# Patient Record
Sex: Female | Born: 1955 | Race: White | Hispanic: No | Marital: Single | Smoking: Never smoker
Health system: Southern US, Community
[De-identification: ages and names within clinical notes are randomized; demographics above are authoritative.]

## PROBLEM LIST (undated history)

## (undated) DIAGNOSIS — E1143 Type 2 diabetes mellitus with diabetic autonomic (poly)neuropathy: Secondary | ICD-10-CM

## (undated) DIAGNOSIS — G5690 Unspecified mononeuropathy of unspecified upper limb: Secondary | ICD-10-CM

## (undated) DIAGNOSIS — E785 Hyperlipidemia, unspecified: Secondary | ICD-10-CM

## (undated) DIAGNOSIS — E1165 Type 2 diabetes mellitus with hyperglycemia: Secondary | ICD-10-CM

## (undated) DIAGNOSIS — J189 Pneumonia, unspecified organism: Secondary | ICD-10-CM

## (undated) DIAGNOSIS — G629 Polyneuropathy, unspecified: Secondary | ICD-10-CM

## (undated) DIAGNOSIS — J45909 Unspecified asthma, uncomplicated: Secondary | ICD-10-CM

## (undated) DIAGNOSIS — K209 Esophagitis, unspecified without bleeding: Secondary | ICD-10-CM

## (undated) DIAGNOSIS — L97311 Non-pressure chronic ulcer of right ankle limited to breakdown of skin: Secondary | ICD-10-CM

## (undated) DIAGNOSIS — E119 Type 2 diabetes mellitus without complications: Secondary | ICD-10-CM

## (undated) DIAGNOSIS — K635 Polyp of colon: Secondary | ICD-10-CM

## (undated) DIAGNOSIS — K219 Gastro-esophageal reflux disease without esophagitis: Secondary | ICD-10-CM

## (undated) DIAGNOSIS — K5730 Diverticulosis of large intestine without perforation or abscess without bleeding: Secondary | ICD-10-CM

## (undated) DIAGNOSIS — K589 Irritable bowel syndrome without diarrhea: Secondary | ICD-10-CM

## (undated) DIAGNOSIS — K649 Unspecified hemorrhoids: Secondary | ICD-10-CM

## (undated) DIAGNOSIS — I639 Cerebral infarction, unspecified: Secondary | ICD-10-CM

## (undated) DIAGNOSIS — M1990 Unspecified osteoarthritis, unspecified site: Secondary | ICD-10-CM

## (undated) DIAGNOSIS — D649 Anemia, unspecified: Secondary | ICD-10-CM

## (undated) DIAGNOSIS — G5601 Carpal tunnel syndrome, right upper limb: Secondary | ICD-10-CM

## (undated) DIAGNOSIS — I10 Essential (primary) hypertension: Secondary | ICD-10-CM

## (undated) DIAGNOSIS — T466X5A Adverse effect of antihyperlipidemic and antiarteriosclerotic drugs, initial encounter: Secondary | ICD-10-CM

## (undated) DIAGNOSIS — Z789 Other specified health status: Secondary | ICD-10-CM

## (undated) DIAGNOSIS — G5793 Unspecified mononeuropathy of bilateral lower limbs: Secondary | ICD-10-CM

## (undated) DIAGNOSIS — E11628 Type 2 diabetes mellitus with other skin complications: Secondary | ICD-10-CM

## (undated) DIAGNOSIS — R112 Nausea with vomiting, unspecified: Secondary | ICD-10-CM

## (undated) DIAGNOSIS — Z9889 Other specified postprocedural states: Secondary | ICD-10-CM

## (undated) DIAGNOSIS — G4730 Sleep apnea, unspecified: Secondary | ICD-10-CM

## (undated) HISTORY — DX: Polyp of colon: K63.5

## (undated) HISTORY — DX: Essential (primary) hypertension: I10

## (undated) HISTORY — DX: Carpal tunnel syndrome, right upper limb: G56.01

## (undated) HISTORY — PX: APPENDECTOMY: SHX54

## (undated) HISTORY — PX: WISDOM TOOTH EXTRACTION: SHX21

## (undated) HISTORY — PX: CERVICAL FUSION: SHX112

## (undated) HISTORY — PX: LEG AMPUTATION BELOW KNEE: SHX694

## (undated) HISTORY — PX: NASAL SINUS SURGERY: SHX719

## (undated) HISTORY — PX: ABDOMINAL HYSTERECTOMY: SHX81

## (undated) HISTORY — PX: TONSILLECTOMY: SUR1361

---
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
## 2003-10-06 ENCOUNTER — Other Ambulatory Visit: Payer: Self-pay

## 2004-03-30 ENCOUNTER — Ambulatory Visit: Payer: Self-pay | Admitting: Unknown Physician Specialty

## 2004-09-13 ENCOUNTER — Ambulatory Visit: Payer: Self-pay | Admitting: Unknown Physician Specialty

## 2004-11-01 ENCOUNTER — Emergency Department: Payer: Self-pay | Admitting: Emergency Medicine

## 2004-12-17 ENCOUNTER — Ambulatory Visit: Payer: Self-pay | Admitting: Internal Medicine

## 2005-09-06 ENCOUNTER — Emergency Department: Payer: Self-pay | Admitting: Emergency Medicine

## 2006-01-29 ENCOUNTER — Ambulatory Visit: Payer: Self-pay

## 2006-11-19 ENCOUNTER — Emergency Department: Payer: Self-pay | Admitting: Emergency Medicine

## 2007-02-13 ENCOUNTER — Other Ambulatory Visit: Payer: Self-pay

## 2007-02-13 ENCOUNTER — Emergency Department: Payer: Self-pay | Admitting: Emergency Medicine

## 2007-02-14 ENCOUNTER — Other Ambulatory Visit: Payer: Self-pay

## 2007-02-14 ENCOUNTER — Observation Stay: Payer: Self-pay | Admitting: Internal Medicine

## 2007-06-06 DIAGNOSIS — I639 Cerebral infarction, unspecified: Secondary | ICD-10-CM

## 2007-06-06 DIAGNOSIS — R2990 Unspecified symptoms and signs involving the nervous system: Secondary | ICD-10-CM

## 2007-06-06 HISTORY — DX: Cerebral infarction, unspecified: I63.9

## 2007-06-06 HISTORY — DX: Unspecified symptoms and signs involving the nervous system: R29.90

## 2007-07-02 ENCOUNTER — Ambulatory Visit: Payer: Self-pay | Admitting: Physician Assistant

## 2007-07-10 ENCOUNTER — Ambulatory Visit: Payer: Self-pay | Admitting: Family Medicine

## 2007-11-07 ENCOUNTER — Ambulatory Visit: Payer: Self-pay | Admitting: Family Medicine

## 2007-11-13 ENCOUNTER — Ambulatory Visit: Payer: Self-pay | Admitting: Physician Assistant

## 2007-12-19 ENCOUNTER — Ambulatory Visit: Payer: Self-pay | Admitting: Physician Assistant

## 2008-11-26 ENCOUNTER — Inpatient Hospital Stay: Payer: Self-pay | Admitting: *Deleted

## 2008-12-30 ENCOUNTER — Ambulatory Visit: Payer: Self-pay | Admitting: Family Medicine

## 2010-03-04 ENCOUNTER — Ambulatory Visit: Payer: Self-pay | Admitting: Unknown Physician Specialty

## 2010-12-30 ENCOUNTER — Ambulatory Visit: Payer: Self-pay

## 2011-07-20 ENCOUNTER — Emergency Department (INDEPENDENT_AMBULATORY_CARE_PROVIDER_SITE_OTHER)
Admission: EM | Admit: 2011-07-20 | Discharge: 2011-07-20 | Disposition: A | Discharge status: Home / Self Care | Payer: 59 | Source: Home / Self Care | Attending: Emergency Medicine | Admitting: Emergency Medicine

## 2011-07-20 ENCOUNTER — Encounter (HOSPITAL_COMMUNITY): Payer: Self-pay | Admitting: Emergency Medicine

## 2011-07-20 DIAGNOSIS — M549 Dorsalgia, unspecified: Secondary | ICD-10-CM

## 2011-07-20 DIAGNOSIS — G8929 Other chronic pain: Secondary | ICD-10-CM

## 2011-07-20 DIAGNOSIS — M5430 Sciatica, unspecified side: Secondary | ICD-10-CM

## 2011-07-20 MED ORDER — HYDROCODONE-IBUPROFEN 7.5-200 MG PO TABS: 1.0000 | ORAL_TABLET | Freq: Three times a day (TID) | ORAL | Status: AC | PRN

## 2011-07-20 MED ORDER — CYCLOBENZAPRINE HCL 10 MG PO TABS: 10.0000 mg | ORAL_TABLET | Freq: Two times a day (BID) | ORAL | Status: AC | PRN

## 2011-07-20 NOTE — ED Notes (Signed)
Pt has a history of sciatic back injury 15 yrs ago. She states she hasn't had any serious exacerbations until now. She has tried heat, exercise, and meds and it still seems to get worse. She has tried a muscle relaxer and aspirin with no relief. She states the pain did not start from an injury and has been present for 1 month. Pain travels from right lower back to about mid thigh on right side.

## 2011-07-20 NOTE — Discharge Instructions (Signed)
No improvement within the next 5-7 days should followup with your primary care Dr. further treatment modalities and further evaluation should occur or pain last more than 6-8 weeks. We also discussed what symptoms or signs should require for you to go immediately to the emergency department for further evaluation. Take medicines as prescribed and be aware that drowsiness or dizziness and previous risk of falls as you will be taking a narcotic meds.      Back Pain, Adult Low back pain is very common. About 1 in 5 people have back pain.The cause of low back pain is rarely dangerous. The pain often gets better over time.About half of people with a sudden onset of back pain feel better in just 2 weeks. About 8 in 10 people feel better by 6 weeks.  CAUSES Some common causes of back pain include:  Strain of the muscles or ligaments supporting the spine.   Wear and tear (degeneration) of the spinal discs.   Arthritis.   Direct injury to the back.  DIAGNOSIS Most of the time, the direct cause of low back pain is not known.However, back pain can be treated effectively even when the exact cause of the pain is unknown.Answering your caregiver's questions about your overall health and symptoms is one of the most accurate ways to make sure the cause of your pain is not dangerous. If your caregiver needs more information, he or she may order lab work or imaging tests (X-rays or MRIs).However, even if imaging tests show changes in your back, this usually does not require surgery. HOME CARE INSTRUCTIONS For many people, back pain returns.Since low back pain is rarely dangerous, it is often a condition that people can learn to [facility] their own.   Remain active. It is stressful on the back to sit or stand in one place. Do not sit, drive, or stand in one place for more than 30 minutes at a time. Take short walks on level surfaces as soon as pain allows.Try to increase the length of time you walk each  day.   Do not stay in bed.Resting more than 1 or 2 days can delay your recovery.   Do not avoid exercise or work.Your body is made to move.It is not dangerous to be active, even though your back may hurt.Your back will likely heal faster if you return to being active before your pain is gone.   Pay attention to your body when you bend and lift. Many people have less discomfortwhen lifting if they bend their knees, keep the load close to their bodies,and avoid twisting. Often, the most comfortable positions are those that put less stress on your recovering back.   Find a comfortable position to sleep. Use a firm mattress and lie on your side with your knees slightly bent. If you lie on your back, put a pillow under your knees.   Only take over-the-counter or prescription medicines as directed by your caregiver. Over-the-counter medicines to reduce pain and inflammation are often the most helpful.Your caregiver may prescribe muscle relaxant drugs.These medicines help dull your pain so you can more quickly return to your normal activities and healthy exercise.   Put ice on the injured area.   Put ice in a plastic bag.   Place a towel between your skin and the bag.   Leave the ice on for 15 to 20 minutes, 3 to 4 times a day for the first 2 to 3 days. After that, ice and heat may be alternated  to reduce pain and spasms.   Ask your caregiver about trying back exercises and gentle massage. This may be of some benefit.   Avoid feeling anxious or stressed.Stress increases muscle tension and can worsen back pain.It is important to recognize when you are anxious or stressed and learn ways to manage it.Exercise is a great option.  SEEK MEDICAL CARE IF:  You have pain that is not relieved with rest or medicine.   You have pain that does not improve in 1 week.   You have new symptoms.   You are generally not feeling well.  SEEK IMMEDIATE MEDICAL CARE IF:   You have pain that radiates  from your back into your legs.   You develop new bowel or bladder control problems.   You have unusual weakness or numbness in your arms or legs.   You develop nausea or vomiting.   You develop abdominal pain.   You feel faint.  Document Released: 05/22/2005 Document Revised: 02/01/2011 Document Reviewed: 10/10/2010 [facility] Patient Information 2012 [place], [place].

## 2011-07-20 NOTE — ED Provider Notes (Signed)
History     CSN: [id number]  Arrival date & time 07/20/11  1628   First MD Initiated Contact with Patient 07/20/11 1644      Chief Complaint  Patient presents with  . Back Pain    (Consider location/radiation/quality/duration/timing/severity/associated sxs/prior treatment) HPI Comments: For about a month patient has been experiencing lower back pain shooting and radiating towards the lateral aspect of her right leg on occasions to her knee exacerbated by movements especially leaning forward or when she's standing up. Patient does not recall any specific injury fall or gesture that could have triggered this pain. She describes that it reminds her when she got her first sciatic injury about 15 years ago is not as severe but is very similar.  Patient denies any urinary incontinence any rectal incontinence no numbness or specific weakness to right lower extremity has been noticed.  Patient is a 56 y.o. female presenting with back pain. The history is provided by the patient.  Back Pain  This is a chronic problem. The current episode started more than 1 week ago. The problem occurs constantly. The problem has been gradually worsening. The pain is associated with lifting heavy objects. The pain is present in the lumbar spine. The quality of the pain is described as shooting. The pain radiates to the right thigh. The pain is at a severity of 8/10. The symptoms are aggravated by bending and twisting. Stiffness is present all day. Pertinent negatives include no fever, no numbness, no headaches, no abdominal swelling, no bladder incontinence, no dysuria, no paresthesias and no paresis. She has tried muscle relaxants and heat for the symptoms. The treatment provided mild relief.    History reviewed. No pertinent past medical history.  History reviewed. No pertinent past surgical history.  History reviewed. No pertinent family history.  History  Substance Use Topics  . Smoking status: Not on file   . Smokeless tobacco: Not on file  . Alcohol Use: Not on file    OB History    Grav Para Term Preterm Abortions TAB SAB Ect Mult Living                  Review of Systems  Constitutional: Negative for fever, chills, appetite change and fatigue.  Genitourinary: Negative for bladder incontinence and dysuria.  Musculoskeletal: Positive for back pain. Negative for joint swelling and arthralgias.  Neurological: Negative for speech difficulty, numbness, headaches and paresthesias.    Allergies  Review of patient's allergies indicates no known allergies.  Home Medications   Current Outpatient Rx  Name Route Sig Dispense Refill  . CYCLOBENZAPRINE HCL 10 MG PO TABS Oral Take 1 tablet (10 mg total) by mouth 2 (two) times daily as needed for muscle spasms. 20 tablet 0  . HYDROCODONE-IBUPROFEN 7.5-200 MG PO TABS Oral Take 1 tablet by mouth every 8 (eight) hours as needed for pain. 15 tablet 0    BP 145/91  Pulse 84  Temp(Src) 98.2 F (36.8 C) (Oral)  Resp 16  SpO2 99%  Physical Exam  Nursing note and vitals reviewed. Constitutional: She appears well-developed and well-nourished. No distress.  Musculoskeletal: She exhibits tenderness.       Lumbar back: She exhibits decreased range of motion, tenderness and pain. She exhibits no swelling, no edema and no deformity.       Back:    ED Course  Procedures (including critical care time)  Labs Reviewed - No data to display No results found.   1. Sciatic nerve pain  2. Chronic back pain       MDM  Patient reports chronic back pain with right sciatic pattern. With sporadic exacerbations able to control with muscle relaxers and heat therapy sometimes under guidance from her primary care doctor at [facility]). About a month ago started feeling this discomfort that has progressively gotten worse with very minimal improvement at home has not seen her primary care Dr. Patient has no muscle weakness and good reflexes. No  symptoms suggestive of a kaluretic canis syndrome or any neurological or vascular comp.romise. Have been recommended to followup with her primary care Dr. after 5 or 7 days for potential physical therapy referral for orthopedic referral if pain were to persist. Patient was also instructed to go emergently to the ED if specific symptoms were to appear        Jimmie Molly, MD 07/20/11 1729

## 2011-12-25 ENCOUNTER — Ambulatory Visit: Payer: Self-pay | Admitting: Family Medicine

## 2012-03-16 ENCOUNTER — Emergency Department: Payer: Self-pay | Admitting: Emergency Medicine

## 2012-10-02 ENCOUNTER — Ambulatory Visit: Payer: Self-pay | Admitting: Family Medicine

## 2012-11-27 ENCOUNTER — Observation Stay (HOSPITAL_COMMUNITY)
Admission: EM | Admit: 2012-11-27 | Discharge: 2012-11-30 | Disposition: A | Discharge status: Home / Self Care | Payer: 59 | Attending: Internal Medicine | Admitting: Internal Medicine

## 2012-11-27 ENCOUNTER — Encounter (HOSPITAL_COMMUNITY): Payer: Self-pay | Admitting: Emergency Medicine

## 2012-11-27 DIAGNOSIS — K5730 Diverticulosis of large intestine without perforation or abscess without bleeding: Secondary | ICD-10-CM

## 2012-11-27 DIAGNOSIS — Z8673 Personal history of transient ischemic attack (TIA), and cerebral infarction without residual deficits: Secondary | ICD-10-CM | POA: Insufficient documentation

## 2012-11-27 DIAGNOSIS — K559 Vascular disorder of intestine, unspecified: Secondary | ICD-10-CM

## 2012-11-27 DIAGNOSIS — K209 Esophagitis, unspecified without bleeding: Secondary | ICD-10-CM | POA: Insufficient documentation

## 2012-11-27 DIAGNOSIS — E785 Hyperlipidemia, unspecified: Secondary | ICD-10-CM | POA: Insufficient documentation

## 2012-11-27 DIAGNOSIS — D62 Acute posthemorrhagic anemia: Secondary | ICD-10-CM | POA: Insufficient documentation

## 2012-11-27 DIAGNOSIS — E119 Type 2 diabetes mellitus without complications: Secondary | ICD-10-CM

## 2012-11-27 DIAGNOSIS — E440 Moderate protein-calorie malnutrition: Secondary | ICD-10-CM

## 2012-11-27 DIAGNOSIS — K625 Hemorrhage of anus and rectum: Principal | ICD-10-CM

## 2012-11-27 DIAGNOSIS — D126 Benign neoplasm of colon, unspecified: Secondary | ICD-10-CM

## 2012-11-27 DIAGNOSIS — I10 Essential (primary) hypertension: Secondary | ICD-10-CM

## 2012-11-27 DIAGNOSIS — Z79899 Other long term (current) drug therapy: Secondary | ICD-10-CM | POA: Insufficient documentation

## 2012-11-27 DIAGNOSIS — R109 Unspecified abdominal pain: Secondary | ICD-10-CM

## 2012-11-27 DIAGNOSIS — K648 Other hemorrhoids: Secondary | ICD-10-CM | POA: Insufficient documentation

## 2012-11-27 HISTORY — DX: Polyneuropathy, unspecified: G62.9

## 2012-11-27 HISTORY — DX: Unspecified hemorrhoids: K64.9

## 2012-11-27 HISTORY — DX: Hyperlipidemia, unspecified: E78.5

## 2012-11-27 HISTORY — DX: Esophagitis, unspecified without bleeding: K20.90

## 2012-11-27 HISTORY — DX: Type 2 diabetes mellitus without complications: E11.9

## 2012-11-27 HISTORY — DX: Diverticulosis of large intestine without perforation or abscess without bleeding: K57.30

## 2012-11-27 HISTORY — DX: Cerebral infarction, unspecified: I63.9

## 2012-11-27 HISTORY — DX: Esophagitis, unspecified: K20.9

## 2012-11-27 MED ORDER — FENTANYL CITRATE 0.05 MG/ML IJ SOLN
50.0000 ug | INTRAMUSCULAR | Status: DC | PRN
  Administered 2012-11-28 (×2): 50 ug via INTRAVENOUS
  Filled 2012-11-27 (×2): qty 2

## 2012-11-27 MED ORDER — ONDANSETRON HCL 4 MG/2ML IJ SOLN
4.0000 mg | Freq: Once | INTRAMUSCULAR | Status: AC
  Administered 2012-11-28: 4 mg via INTRAVENOUS
  Filled 2012-11-27: qty 2

## 2012-11-27 MED ORDER — SODIUM CHLORIDE 0.9 % IV SOLN
INTRAVENOUS | Status: DC
  Administered 2012-11-28 – 2012-11-30 (×6): via INTRAVENOUS

## 2012-11-27 NOTE — ED Provider Notes (Signed)
History    CSN: [id number] Arrival date & time 11/27/12  2127  First MD Initiated Contact with Patient 11/27/12 2313     Chief Complaint  Patient presents with  . Rectal Bleeding   (Consider location/radiation/quality/duration/timing/severity/associated sxs/prior Treatment) HPI Hx per PT BRB rectal bleeding x 2 days worse tonight, describes sig amount of blood in the toilet with and without BM, has h/o internal hemorrhoids, no h/o rectal bleeding. Some low back pain denies any rectal pain or ABD pain, no hematuria and no vag bleeding/ prior hysto.  No F/C, no diarrhea or constipation, no black stools/ some N/V today. Takes 325mg  ASA daily. Has h/o diverticulitis  Past Medical History  Diagnosis Date  . Hemorrhoid   . Neuropathy   . Diabetes mellitus without complication   . Hyperlipemia   . Stroke 2009    mimi stroke   Past Surgical History  Procedure Laterality Date  . Tonsillectomy    . Abdominal hysterectomy     No family history on file. History  Substance Use Topics  . Smoking status: Never Smoker   . Smokeless tobacco: Never Used  . Alcohol Use: No   OB History   Grav Para Term Preterm Abortions TAB SAB Ect Mult Living                 Review of Systems  Constitutional: Negative for fever and chills.  HENT: Negative for neck pain and neck stiffness.   Eyes: Negative for pain.  Respiratory: Negative for shortness of breath.   Cardiovascular: Negative for chest pain.  Gastrointestinal: Positive for blood in stool and anal bleeding. Negative for abdominal pain.  Genitourinary: Negative for dysuria.  Musculoskeletal: Negative for back pain.  Skin: Negative for rash.  Neurological: Negative for headaches.  All other systems reviewed and are negative.    Allergies  Atorvastatin; Latex; and Pravastatin  Home Medications   Current Outpatient Rx  Name  Route  Sig  Dispense  Refill  . acetaminophen (TYLENOL) 650 MG CR tablet   Oral   Take 1,300 mg by  mouth every 8 (eight) hours as needed for pain.         [name] albuterol (PROVENTIL HFA;VENTOLIN HFA) 108 (90 BASE) MCG/ACT inhaler   Inhalation   Inhale 2 puffs into the lungs every 6 (six) hours as needed for wheezing or shortness of breath.         [name] aspirin 325 MG tablet   Oral   Take 325 mg by mouth at bedtime.         . gabapentin (NEURONTIN) 300 MG capsule   Oral   Take 300 mg by mouth 3 (three) times daily.         [name] lisinopril (PRINIVIL,ZESTRIL) 10 MG tablet   Oral   Take 10 mg by mouth daily.         . metFORMIN (GLUCOPHAGE) 500 MG tablet   Oral   Take 500 mg by mouth 2 (two) times daily with a meal.         . Multiple Vitamin (MULTIVITAMIN WITH MINERALS) TABS   Oral   Take 1 tablet by mouth daily.         . niacin (NIASPAN) 500 MG CR tablet   Oral   Take 500 mg by mouth at bedtime.         [name] omega-3 acid ethyl esters (LOVAZA) 1 G capsule   Oral   Take 2 g by mouth 2 (two) times  daily.          BP 142/55  Pulse 84  Temp(Src) 98.6 F (37 C) (Oral)  Resp 15  Ht 5' (1.524 m)  Wt 206 lb (93.441 kg)  BMI 40.23 kg/m2  SpO2 97% Physical Exam  Constitutional: She is oriented to person, place, and time. She appears well-developed and well-nourished.  HENT:  Head: Normocephalic and atraumatic.  Eyes: EOM are normal. Pupils are equal, round, and reactive to light.  Neck: Neck supple.  Cardiovascular: Normal rate, regular rhythm and intact distal pulses.   Pulmonary/Chest: Effort normal and breath sounds normal. No respiratory distress.  Abdominal: Soft. Bowel sounds are normal. She exhibits no distension. There is no tenderness.  Genitourinary:  Rectal exam, no fissure or ext hemorrhoids, nontender, no blood or stool in rectal vault  Musculoskeletal: Normal range of motion. She exhibits no edema.  Neurological: She is alert and oriented to person, place, and time.  Skin: Skin is warm and dry.    ED Course  Procedures (including critical care  time)   Results for orders placed during the hospital encounter of 11/27/12  CBC      Result Value Range   WBC 9.7  4.0 - 10.5 K/uL   RBC 4.88  3.87 - 5.11 MIL/uL   Hemoglobin 13.2  12.0 - 15.0 g/dL   HCT 95.6  21.3 - 08.6 %   MCV 82.0  78.0 - 100.0 fL   MCH 27.0  26.0 - 34.0 pg   MCHC 33.0  30.0 - 36.0 g/dL   RDW 57.8  46.9 - 62.9 %   Platelets 262  150 - 400 K/uL  COMPREHENSIVE METABOLIC PANEL      Result Value Range   Sodium 136  135 - 145 mEq/L   Potassium 3.9  3.5 - 5.1 mEq/L   Chloride 99  96 - 112 mEq/L   CO2 25  19 - 32 mEq/L   Glucose, Bld 174 (*) 70 - 99 mg/dL   BUN 13  6 - 23 mg/dL   Creatinine, Ser 5.28  0.50 - 1.10 mg/dL   Calcium 9.7  8.4 - 41.3 mg/dL   Total Protein 7.7  6.0 - 8.3 g/dL   Albumin 3.6  3.5 - 5.2 g/dL   AST 18  0 - 37 U/L   ALT 21  0 - 35 U/L   Alkaline Phosphatase 92  39 - 117 U/L   Total Bilirubin 0.1 (*) 0.3 - 1.2 mg/dL   GFR calc non Af Amer >90  >90 mL/min   GFR calc Af Amer >90  >90 mL/min   Guaiac positive  IVFs, fentanyl and zofran  3:40 AM MED consulted, d/w Dr Conley Rolls - he will admit for GIB  MDM  Rectal bleeding  Labs obtained/ reviewed  IVFs and IV medications provided  MED admit  Sunnie Nielsen, MD 11/28/12 [phone]

## 2012-11-27 NOTE — ED Notes (Signed)
Pt states that she began blood in her stool on Monday, which progressed to bright red frank blood yesterday and today. Pt states she has hx of hemerroids but does not feel any rectal pain. Pt also states that she has had lower back pain and left sided abdominal pain.

## 2012-11-28 ENCOUNTER — Encounter (HOSPITAL_COMMUNITY): Payer: Self-pay | Admitting: Neurology

## 2012-11-28 DIAGNOSIS — R109 Unspecified abdominal pain: Secondary | ICD-10-CM

## 2012-11-28 DIAGNOSIS — K625 Hemorrhage of anus and rectum: Secondary | ICD-10-CM

## 2012-11-28 DIAGNOSIS — I10 Essential (primary) hypertension: Secondary | ICD-10-CM | POA: Diagnosis present

## 2012-11-28 DIAGNOSIS — E440 Moderate protein-calorie malnutrition: Secondary | ICD-10-CM | POA: Insufficient documentation

## 2012-11-28 DIAGNOSIS — D126 Benign neoplasm of colon, unspecified: Secondary | ICD-10-CM

## 2012-11-28 LAB — COMPREHENSIVE METABOLIC PANEL WITH GFR
ALT: 21 U/L (ref 0–35)
AST: 18 U/L (ref 0–37)
Albumin: 3.6 g/dL (ref 3.5–5.2)
Alkaline Phosphatase: 92 U/L (ref 39–117)
BUN: 13 mg/dL (ref 6–23)
CO2: 25 meq/L (ref 19–32)
Calcium: 9.7 mg/dL (ref 8.4–10.5)
Chloride: 99 meq/L (ref 96–112)
Creatinine, Ser: 0.56 mg/dL (ref 0.50–1.10)
GFR calc Af Amer: >90 mL/min
GFR calc non Af Amer: >90 mL/min
Glucose, Bld: 174 mg/dL — ABNORMAL HIGH (ref 70–99)
Potassium: 3.9 meq/L (ref 3.5–5.1)
Sodium: 136 meq/L (ref 135–145)
Total Bilirubin: 0.1 mg/dL — ABNORMAL LOW (ref 0.3–1.2)
Total Protein: 7.7 g/dL (ref 6.0–8.3)

## 2012-11-28 LAB — CBC
HCT: 39.5 % (ref 36.0–46.0)
Hemoglobin: 12.3 g/dL (ref 12.0–15.0)
Hemoglobin: 11.4 g/dL — ABNORMAL LOW (ref 12.0–15.0)
MCH: 27.3 pg (ref 26.0–34.0)
MCHC: 33 g/dL (ref 30.0–36.0)
Platelets: 194 10*3/uL (ref 150–400)
Platelets: 262 10*3/uL (ref 150–400)
RBC: 4.51 MIL/uL (ref 3.87–5.11)
RBC: 4.3 MIL/uL (ref 3.87–5.11)
RDW: 13 % (ref 11.5–15.5)
RDW: 13.1 % (ref 11.5–15.5)
WBC: 8.1 10*3/uL (ref 4.0–10.5)
WBC: 9.7 10*3/uL (ref 4.0–10.5)

## 2012-11-28 LAB — OCCULT BLOOD X 1 CARD TO LAB, STOOL: Fecal Occult Bld: POSITIVE — AB

## 2012-11-28 LAB — GLUCOSE, CAPILLARY
Glucose-Capillary: 153 mg/dL — ABNORMAL HIGH (ref 70–99)
Glucose-Capillary: 153 mg/dL — ABNORMAL HIGH (ref 70–99)
Glucose-Capillary: 132 mg/dL — ABNORMAL HIGH (ref 70–99)

## 2012-11-28 MED ORDER — ALBUTEROL SULFATE HFA 108 (90 BASE) MCG/ACT IN AERS
2.0000 | INHALATION_SPRAY | Freq: Four times a day (QID) | RESPIRATORY_TRACT | Status: DC | PRN
Start: 2012-11-28 — End: 2012-11-30
  Filled 2012-11-28: qty 6.7

## 2012-11-28 MED ORDER — ONDANSETRON HCL 4 MG/2ML IJ SOLN: 4.0000 mg | Freq: Four times a day (QID) | INTRAMUSCULAR | Status: DC | PRN

## 2012-11-28 MED ORDER — LISINOPRIL 10 MG PO TABS
10.0000 mg | ORAL_TABLET | Freq: Every day | ORAL | Status: DC
  Administered 2012-11-28 – 2012-11-30 (×3): 10 mg via ORAL
  Filled 2012-11-28 (×3): qty 1

## 2012-11-28 MED ORDER — ACETAMINOPHEN 325 MG PO TABS: 650.0000 mg | ORAL_TABLET | Freq: Four times a day (QID) | ORAL | Status: DC | PRN

## 2012-11-28 MED ORDER — INSULIN ASPART 100 UNIT/ML ~~LOC~~ SOLN: 0.0000 [IU] | SUBCUTANEOUS | Status: DC | PRN

## 2012-11-28 MED ORDER — MORPHINE SULFATE 2 MG/ML IJ SOLN: 1.0000 mg | INTRAMUSCULAR | Status: DC | PRN

## 2012-11-28 MED ORDER — ADULT MULTIVITAMIN W/MINERALS CH
1.0000 | ORAL_TABLET | Freq: Every day | ORAL | Status: DC
  Administered 2012-11-28 – 2012-11-30 (×3): 1 via ORAL
  Filled 2012-11-28 (×3): qty 1

## 2012-11-28 MED ORDER — GABAPENTIN 300 MG PO CAPS
300.0000 mg | ORAL_CAPSULE | Freq: Three times a day (TID) | ORAL | Status: DC
  Administered 2012-11-28 – 2012-11-30 (×7): 300 mg via ORAL
  Filled 2012-11-28 (×9): qty 1

## 2012-11-28 MED ORDER — HYDROCODONE-ACETAMINOPHEN 5-325 MG PO TABS
1.0000 | ORAL_TABLET | ORAL | Status: DC | PRN
  Administered 2012-11-28 – 2012-11-29 (×3): 1 via ORAL
  Filled 2012-11-28 (×3): qty 1

## 2012-11-28 MED ORDER — PANTOPRAZOLE SODIUM 40 MG PO TBEC
40.0000 mg | DELAYED_RELEASE_TABLET | Freq: Every day | ORAL | Status: DC
  Administered 2012-11-28 – 2012-11-30 (×3): 40 mg via ORAL
  Filled 2012-11-28 (×3): qty 1

## 2012-11-28 NOTE — Care Management Note (Addendum)
    Page 1 of 1   11/29/2012     1:13:32 PM   CARE MANAGEMENT NOTE 11/29/2012  Patient:  Olivia, Romero   Account Number:  [id number]  Date Initiated:  11/28/2012  Documentation initiated by:  Lanier Clam  Subjective/Objective Assessment:   ADMITTED W/[id number].[id number].     Action/Plan:   FROM HOME ALONE.HAS PCP,PHARMACY.   Anticipated DC Date:  11/29/2012   Anticipated DC Plan:  HOME/SELF CARE      DC Planning Services  CM consult      Choice offered to / List presented to:             Status of service:  In process, will continue to follow Medicare Important Message given?   (If response is "NO", the following Medicare IM given date fields will be blank) Date Medicare IM given:   Date Additional Medicare IM given:    Discharge Disposition:    Per UR Regulation:  Reviewed for med. necessity/level of care/duration of stay  If discussed at Long Length of Stay Meetings, dates discussed:    Comments:  11/29/12 Olivia Barsamian RN,BSN NCM [phone] SPOKE TO PATIENT ABOUT HOSPITAL STATUS OF OBSERVATION &PER GI NOTE COLONOSCOPY TO BE  DONE IN THE HOSPITAL.EXPLAINED THAT SHE COULD CALL HER INSURANCE CO WITH CONCERNS ABOUT COVERED SERVICES.PATIENT VOICED UNDERSTANDING.MD/NSG UPDATED.  11/28/12 Olivia Bevilacqua RN,BSN NCM [phone]

## 2012-11-28 NOTE — Progress Notes (Signed)
[facility] PROGRESS NOTE  Olivia Romero RUE:[id number] DOB: 1956/04/29 DOA: 11/27/2012 PCP: Dennison Mascot, MD  Brief narrative 57 yo WF with a PMHx of DM II, hyperlipidemia, TIA, diverticulosis, internal hemorrhoids, and esophagitis who presents to the [facility] ED with c/o 5 episodes of bright red rectal bleeding since Monday 6/23. She gives history of left-sided lower back pain that started prior to rectal bleeding and subsequent left lower quadrant abdominal pain. She denies nausea, vomiting, dizziness, lightheadedness, fever or chills. Initial BMs had some stool but subsequent ones was only blood. No clots. Last BM on 6/25 at 4 PM. Hx of documented internal hemorrhoids and diverticulosis per colonoscopy about 7 years ago per Dr. Maryruth Bun (deceased) in [place], [place]. Reports "several" episodes of diverticulitis over the years.  She takes ASA 325mg  per day since a TIA in 2009. No Plavix. Has continued her ASA since Monday. In the ED, her Hgb is normal. Rectal exam revealed positive guiac. Hospitalist admission requested.   Assessment/Plan: 1. Rectal bleeding: DD-hemorrhoidal/diverticular/ischemic colitis/cancer. Apparently had a small soft BM with small amount of bright red blood late this morning. Monitor bleeding. Hold ASA. IVF resuscitation. Should abd pain worsen or she develops fever, n/v-may need CT abd. She doesn't have a fever and her WBCC is normal, so doubt diverticulitis. Hemoglobin stable. Started clear liquid diet. [facility] consulted- Monitor for spontaneous cessation and may not require inpatient colonoscopy. 2. DM II-will hold Metformin while here and use SSI. Good inpatient control. 3. HTN-controlled, cont Lisinopril.  4. Hyperlipidemia-cont med  5. Hx TIA in 2009. Hold ASA.  6. VTE ppx-SCDs.  7. Hx esophagitis plus #1-Protonix.   Code Status: Full Family Communication: None Disposition Plan: Home when medically stable   Consultants:  [facility]  Procedures:  None  Antibiotics:  None   HPI/Subjective: Feels better. Did not have a BM since 6/25 4 PM until late this morning. Mild low back pain and left lower quadrant abdominal pain. No nausea, vomiting, fever or chills.  Objective: Filed Vitals:   11/27/12 2147 11/28/12 0630  BP: 142/55 111/48  Pulse: 84 69  Temp: 98.6 F (37 C) 98.1 F (36.7 C)  TempSrc: Oral Oral  Resp: 15 16  Height: 5' (1.524 m) 5' (1.524 m)  Weight: 93.441 kg (206 lb) 93.4 kg (205 lb 14.6 oz)  SpO2: 97% 99%    Intake/Output Summary (Last 24 hours) at 11/28/12 1308 Last data filed at 11/28/12 0955  Gross per 24 hour  Intake    320 ml  Output    400 ml  Net    -80 ml   Filed Weights   11/27/12 2147 11/28/12 0630  Weight: 93.441 kg (206 lb) 93.4 kg (205 lb 14.6 oz)    Exam:   General exam: Comfortable.  Respiratory system: Clear. No increased work of breathing.  Cardiovascular system: S1 & S2 heard, RRR. No JVD, murmurs, gallops, clicks or pedal edema.  Gastrointestinal system: Abdomen is nondistended. Tender left lower quadrant but without rigidity, guarding or rebound. Abdomen essentially soft. Normal bowel sounds heard.  Central nervous system: Alert and oriented. No focal neurological deficits.  Extremities: Symmetric 5 x 5 power.   Data Reviewed: Basic Metabolic Panel:  Recent Labs Lab 11/27/12 2351  NA 136  K 3.9  CL 99  CO2 25  GLUCOSE 174*  BUN 13  CREATININE 0.56  CALCIUM 9.7   Liver Function Tests:  Recent Labs Lab 11/27/12 2351  AST 18  ALT 21  ALKPHOS 92  BILITOT  0.1*  PROT 7.7  ALBUMIN 3.6   No results found for this basename: LIPASE, AMYLASE,  in the last 168 hours No results found for this basename: AMMONIA,  in the last 168 hours CBC:  Recent Labs Lab 11/27/12 2351 11/28/12 0730  WBC 9.7 7.8  HGB 13.2 12.3  HCT 40.0 37.4  MCV 82.0 82.9  PLT 262 228   Cardiac Enzymes: No results found for this basename: CKTOTAL, CKMB,  CKMBINDEX, TROPONINI,  in the last 168 hours BNP (last 3 results) No results found for this basename: PROBNP,  in the last 8760 hours CBG:  Recent Labs Lab 11/28/12 0831 11/28/12 1127  GLUCAP 153* 153*    No results found for this or any previous visit (from the past 240 hour(s)).   Studies: No results found.   Additional labs:   Scheduled Meds: . gabapentin  300 mg Oral TID  . lisinopril  10 mg Oral Daily  . multivitamin with minerals  1 tablet Oral Daily  . pantoprazole  40 mg Oral Daily   Continuous Infusions: . sodium chloride 125 mL/hr at 11/28/12 1610    Principal Problem:   Bright red blood per rectum Active Problems:   Diabetes mellitus   Essential hypertension, benign    Time spent: 25 minutes    [facility]  [facility] Pager [phone].   If 8PM-8AM, please contact night-coverage at [email or web address], password [email or web address] 11/28/2012, 1:08 PM  LOS: 1 day

## 2012-11-28 NOTE — H&P (Signed)
Olivia Romero is an 57 y.o. female.    PCP: Dr. Ralene Muskrat of [place]  Chief Complaint: BRBPR HPI: Olivia Romero is a 58 yo WF with a PMHx of DM II, hyperlipidemia, TIA, diverticulosis, internal hemorrhoids, and esophagitis who presents to the [facility] ED with c/o rectal bleeding since Monday 6/23 associated mostly with BMs. She reports having some lower back discomfort then having BRBPR with BM Monday night. Since then, she has had 1-2 BMs per day with BRBPR. No bleeding between BMs. Had one episode of bleeding on toilet without having a BM, but did have "gas". Has NEVER had a GIB before. She reports no diarrhea or constipation. Mild LLQ discomfort which just started late yesterday. Some nausea today but no vomiting. Not much of an appetite since Monday.  Hx of documented internal hemorrhoids and diverticulosis per colonoscopy about 7 years ago per Dr. Maryruth Bun (deceased) in [place], [place]. Reports "several" episodes of diverticulitis over the years.  She takes ASA 325mg  per day since a TIA in 2009. No Plavix. Has continued her ASA since Monday.  Hx DM II. Takes Meformin at home. Sugars fairly normal.  In the ED, her Hgb is normal. Rectal exam revealed positive guiac. Therefore, because of her BRBPR and hx of internal hemorrhoids and diverticulosis, [facility] is asked to admit Olivia Romero for further evaluation and treatment.   Past Medical History  Diagnosis Date  . Hemorrhoid     internal  . Neuropathy   . Diabetes mellitus without complication   . Hyperlipemia   . Stroke-like episode 2009    TIA  . Diverticulosis of colon   . Esophagitis     Past Surgical History  Procedure Laterality Date  . Tonsillectomy    . Abdominal hysterectomy      FMHx: diabetes, hyperlipidemia, hypertension Social History:  reports that she has never smoked. She has never used smokeless tobacco. She reports that she does not drink alcohol or use illicit drugs. Works as a Runner, broadcasting/film/video at [employer]. One  grown son, one grandchild. Divorced.   Allergies:  Allergies  Allergen Reactions  . Atorvastatin     Unknown bothers liver  . Latex Itching  . Pravastatin Itching and Rash     Results for orders placed during the hospital encounter of 11/27/12 (from the past 48 hour(s))  CBC     Status: None   Collection Time    11/27/12 11:51 PM      Result Value Range   WBC 9.7  4.0 - 10.5 K/uL   RBC 4.88  3.87 - 5.11 MIL/uL   Hemoglobin 13.2  12.0 - 15.0 g/dL   HCT 16.1  09.6 - 04.5 %   MCV 82.0  78.0 - 100.0 fL   MCH 27.0  26.0 - 34.0 pg   MCHC 33.0  30.0 - 36.0 g/dL   RDW 40.9  81.1 - 91.4 %   Platelets 262  150 - 400 K/uL  COMPREHENSIVE METABOLIC PANEL     Status: Abnormal   Collection Time    11/27/12 11:51 PM      Result Value Range   Sodium 136  135 - 145 mEq/L   Potassium 3.9  3.5 - 5.1 mEq/L   Chloride 99  96 - 112 mEq/L   CO2 25  19 - 32 mEq/L   Glucose, Bld 174 (*) 70 - 99 mg/dL   BUN 13  6 - 23 mg/dL   Creatinine, Ser 7.82  0.50 - 1.10 mg/dL  Calcium 9.7  8.4 - 10.5 mg/dL   Total Protein 7.7  6.0 - 8.3 g/dL   Albumin 3.6  3.5 - 5.2 g/dL   AST 18  0 - 37 U/L   ALT 21  0 - 35 U/L   Alkaline Phosphatase 92  39 - 117 U/L   Total Bilirubin 0.1 (*) 0.3 - 1.2 mg/dL   GFR calc non Af Amer >90  >90 mL/min   GFR calc Af Amer >90  >90 mL/min   Comment:            The eGFR has been calculated     using the CKD EPI equation.     This calculation has not been     validated in all clinical     situations.     eGFR's persistently     <90 mL/min signify     possible Chronic Kidney Disease.   No results found.  Review of Systems  Constitutional: Negative for fever, chills, weight loss, malaise/fatigue and diaphoresis.  HENT: Negative.   Eyes: Negative.  Negative for blurred vision.  Respiratory: Negative for cough, hemoptysis, sputum production and shortness of breath.   Cardiovascular: Negative for chest pain, orthopnea and leg swelling.  Gastrointestinal: Positive for  nausea, abdominal pain (left lower quadrant) and blood in stool (since Monday at 8pm, has had 1-2 stools/day with BRBPR since then). Negative for heartburn, diarrhea and constipation.  Genitourinary: Negative for dysuria, urgency, frequency, hematuria and flank pain.  Musculoskeletal: Positive for back pain (pressure, more on the left, only since BRBPR).  Skin: Negative.  Negative for rash.  Neurological: Negative for dizziness, speech change, focal weakness and weakness.  Endo/Heme/Allergies: Does not bruise/bleed easily.  Psychiatric/Behavioral: Negative for depression. The patient is not nervous/anxious.     Blood pressure 142/55, pulse 84, temperature 98.6 F (37 C), temperature source Oral, resp. rate 15, height 5' (1.524 m), weight 93.441 kg (206 lb), SpO2 97.00%. Physical Exam  Constitutional: She is oriented to person, place, and time. She appears well-developed and well-nourished. No distress.  Obese. Appears well.   HENT:  Head: Normocephalic and atraumatic.  Right Ear: External ear normal.  Left Ear: External ear normal.  Mouth/Throat: Oropharynx is clear and moist. No oropharyngeal exudate.  Eyes: Conjunctivae are normal. Pupils are equal, round, and reactive to light. Right eye exhibits no discharge. Left eye exhibits no discharge. No scleral icterus.  Neck: Normal range of motion. Neck supple. No tracheal deviation present. No thyromegaly present.  Cardiovascular: Normal rate, regular rhythm and intact distal pulses.  Exam reveals no gallop and no friction rub.   No murmur heard. No carotid bruits  Respiratory: Effort normal and breath sounds normal. No respiratory distress. She has no wheezes. She has no rales. She exhibits no tenderness.  GI: Soft. Bowel sounds are normal. She exhibits no distension. There is tenderness (LLQ). There is no rebound and no guarding.  Genitourinary:  Deferred   Musculoskeletal: Normal range of motion. She exhibits no edema and no tenderness.   Lymphadenopathy:    She has no cervical adenopathy.  Neurological: She is alert and oriented to person, place, and time. No cranial nerve deficit.  Skin: Skin is warm and dry. No rash noted. She is not diaphoretic. No erythema. No pallor.  Psychiatric: She has a normal mood and affect. Her behavior is normal. Judgment and thought content normal.     Assessment/Plan 1. BRBPR with hx of internal hemorrhoids and diverticulosis-admit to med surg. Serial CBCs.  Monitor bleeding. Hold ASA. GI consult in am deferred to attending. Had colonoscopy about 7 years ago (routine). IVF resuscitation. Should abd pain worsen or she develops fever, n/v-may need CT abd. She doesn't have a fever and her WBCC is normal, so doubt diverticulitis.  2. DM II-will hold Metformin while here and use SSI.  3. HTN-controlled, cont Lisinopril. 4. Hyperlipidemia-cont med 5. Hx TIA in 2009. Hold ASA.  6. VTE ppx-SCDs. 7. Hx esophagitis plus #1-Protonix.  Rest of plan per orders.  Discussed with Dr. Conley Rolls who will see Olivia Romero also and review/cosign note.   KIRBY-GRAHAM, Mechel Haggard 11/28/2012, 4:00 AM

## 2012-11-28 NOTE — H&P (Signed)
Agreed and as discussed with MLP.  I have seen this patient and reviewed her chart.  57 yo presents with several BRBPR.  Essentially no abdominal pain, maybe slightly LLQ.  Hemodynamic stability and normal Hb.  She has known diverticulosis.  No elevated BUN, melena, or NSAIDS use.  Suspect diverticular bleed.  Will admit for serial H/H.

## 2012-11-28 NOTE — Consult Note (Signed)
[facility] Consultation  Referring Provider:     [facility]. Primary Care Physician:  Dennison Mascot, MD Primary Gastroenterologist:    None at present.  Reason for Consultation:    Rectal bleeding          HPI:   Olivia Romero is a 57 y.o. female who presented to ED last night with rectal bleeding. She is hemodynamically stable, hbg down from 13.2 last night to 12.3 this am. On Monday night she developed lower back pain. Following that patient went to bathroom and passed a formed stool and some bright red blood. She had a few more episodes Tuesday and yesterday. Stools are not particularly loose, not any more frequent than normal. Patient had a colonoscopy in [place] approximately 9 years ago with a GI physician who has since passed away. She called Dr. Earnest Conroy office yesterday to make a new patient appointment but couldn't be seen until July. Patient elected to come to ED for evaluation.   Patient gives a history of diverticulitis treated with antibiotics on several occasions. No other chronic GI complaints. She had IBS in teenage years but no problems now. No FMH of IBD or colon cancer.    Past Medical History  Diagnosis Date  . Hemorrhoid     internal  . Neuropathy   . Diabetes mellitus without complication   . Hyperlipemia   . Stroke-like episode 2009    TIA  . Diverticulosis of colon   . Esophagitis     Past Surgical History  Procedure Laterality Date  . Tonsillectomy    . Abdominal hysterectomy     FMH: No CRC or IBD.  History  Substance Use Topics  . Smoking status: Never Smoker   . Smokeless tobacco: Never Used  . Alcohol Use: No    Prior to Admission medications   Medication Sig Start Date End Date Taking? Authorizing Provider  albuterol (PROVENTIL HFA;VENTOLIN HFA) 108 (90 BASE) MCG/ACT inhaler Inhale 2 puffs into the lungs every 6 (six) hours as needed for wheezing or shortness of breath.   Yes Historical Provider, MD  aspirin 325 MG  tablet Take 325 mg by mouth at bedtime.   Yes Historical Provider, MD  gabapentin (NEURONTIN) 300 MG capsule Take 300 mg by mouth 3 (three) times daily.   Yes Historical Provider, MD  lisinopril (PRINIVIL,ZESTRIL) 10 MG tablet Take 10 mg by mouth daily.   Yes Historical Provider, MD  metFORMIN (GLUCOPHAGE) 500 MG tablet Take 500 mg by mouth 2 (two) times daily with a meal.   Yes Historical Provider, MD  Multiple Vitamin (MULTIVITAMIN WITH MINERALS) TABS Take 1 tablet by mouth daily.   Yes Historical Provider, MD  niacin (NIASPAN) 500 MG CR tablet Take 500 mg by mouth at bedtime.   Yes Historical Provider, MD  omega-3 acid ethyl esters (LOVAZA) 1 G capsule Take 2 g by mouth 2 (two) times daily.   Yes Historical Provider, MD    Current Facility-Administered Medications  Medication Dose Route Frequency Provider Last Rate Last Dose  . 0.9 %  sodium chloride infusion   Intravenous Continuous Sunnie Nielsen, MD 125 mL/hr at 11/28/12 [phone]    . acetaminophen (TYLENOL) tablet 650 mg  650 mg Oral Q6H PRN Leda Gauze, NP      . albuterol (PROVENTIL HFA;VENTOLIN HFA) 108 (90 BASE) MCG/ACT inhaler 2 puff  2 puff Inhalation Q6H PRN Leda Gauze, NP      . gabapentin (NEURONTIN) capsule 300 mg  300 mg Oral TID  Leda Gauze, NP   300 mg at 11/28/12 1124  . HYDROcodone-acetaminophen (NORCO/VICODIN) 5-325 MG per tablet 1 tablet  1 tablet Oral Q4H PRN Leda Gauze, NP      . insulin aspart (novoLOG) injection 0-15 Units  0-15 Units Subcutaneous Q4H PRN Leda Gauze, NP      . lisinopril (PRINIVIL,ZESTRIL) tablet 10 mg  10 mg Oral Daily Leda Gauze, NP   10 mg at 11/28/12 1124  . morphine 2 MG/ML injection 1 mg  1 mg Intravenous Q4H PRN Leda Gauze, NP      . multivitamin with minerals tablet 1 tablet  1 tablet Oral Daily Leda Gauze, NP   1 tablet at 11/28/12 1124  . ondansetron (ZOFRAN) injection 4 mg  4 mg Intravenous Q6H PRN Leda Gauze,  NP      . pantoprazole (PROTONIX) EC tablet 40 mg  40 mg Oral Daily Leda Gauze, NP   40 mg at 11/28/12 1124    Allergies as of 11/27/2012 - Review Complete 11/27/2012  Allergen Reaction Noted  . Atorvastatin  11/27/2012  . Latex Itching 11/27/2012  . Pravastatin Itching and Rash 11/27/2012    Review of Systems:    All systems reviewed and negative except where noted in HPI.    Physical Exam:  Vital signs in last 24 hours: Temp:  [98.1 F (36.7 C)-98.6 F (37 C)] 98.1 F (36.7 C) (06/26 0630) Pulse Rate:  [69-84] 69 (06/26 0630) Resp:  [15-16] 16 (06/26 0630) BP: (111-142)/(48-55) 111/48 mmHg (06/26 0630) SpO2:  [97 %-99 %] 99 % (06/26 0630) Weight:  [205 lb 14.6 oz (93.4 kg)-206 lb (93.441 kg)] 205 lb 14.6 oz (93.4 kg) (06/26 0630) Last BM Date: 11/27/12 General:   Pleasant white female in NAD Head:  Normocephalic and atraumatic. Eyes:   No icterus.   Conjunctiva pink. Ears:  Normal auditory acuity. Neck:  Supple; no masses felt Lungs:  Respirations even and unlabored. Lungs clear to auscultation bilaterally.   No wheezes, crackles, or rhonchi.  Heart:  Regular rate and rhythm Abdomen:  Soft, nondistended, moderate LLQ tenderness. Normal bowel sounds. No appreciable masses or hepatomegaly.  Rectal:   A few external hemorrhoids. Flecks of bright red blood in vault Msk:  Symmetrical without gross deformities.  Extremities:  Without edema. Neurologic:  Alert and  oriented x4;  grossly normal neurologically. Skin:  Intact without significant lesions or rashes. Cervical Nodes:  No significant cervical adenopathy. Psych:  Alert and cooperative. Normal affect.  LAB RESULTS:  Recent Labs  11/27/12 2351 11/28/12 0730  WBC 9.7 7.8  HGB 13.2 12.3  HCT 40.0 37.4  PLT 262 228   BMET  Recent Labs  11/27/12 2351  NA 136  K 3.9  CL 99  CO2 25  GLUCOSE 174*  BUN 13  CREATININE 0.56  CALCIUM 9.7   LFT  Recent Labs  11/27/12 2351  PROT 7.7  ALBUMIN 3.6   AST 18  ALT 21  ALKPHOS 92  BILITOT 0.1*    PREVIOUS ENDOSCOPIES:            colonoscopy approximately 9 years ago in [place], ? Results    Impression / Plan:   1. Three day history of rectal bleeding without actually diarrhea. Bleeding preceded by lower back pain. No abdominal pain but she is tender in LLQ. Just had a BM which was soft with a small amount of bright red blood surrounding it. This could be hemorrhoidal  bleeding. Diverticular hemorrhage, ischemic colitis, polyps / neoplasm on differential diagnosis list as well. Will continue clears, see how she does today. Inpatient colonoscopy may not be necessary. Patient had planned on establishing care with Dr. Mechele Collin (GI) in [place] (her preference).   2. Diabetes  Thanks   LOS: 1 day   Willette Cluster  11/28/2012, 11:59 AM    GI ATTENDING  Patient presents with minor rectal bleeding. Agree with plans for observation. If bleeding worsens, can workup inpatient with colonoscopy. Otherwise outpatient colonoscopy.  Wilhemina Bonito. Eda Keys., M.D. [facility] of Gastroenterology

## 2012-11-28 NOTE — Progress Notes (Signed)
INITIAL NUTRITION ASSESSMENT  Pt meets criteria for moderate MALNUTRITION in the context of acute illness as evidenced by <75% estimated energy intake for the past week with 6.8% weight loss in the past month per pt report.  DOCUMENTATION CODES Per approved criteria  -Moderate (non-severe) malnutrition in the context of acute illness or injury -Morbid Obesity   INTERVENTION: - Diet advancement per MD - Will continue to monitor   NUTRITION DIAGNOSIS: Inadequate oral intake related to clear liquid diet as evidenced by diet order.   Goal: Advance diet as tolerated to diabetic diet  Monitor:  Weights, labs, diet advancement  Reason for Assessment: Nutrition risk   57 y.o. female  Admitting Dx: Bright red blood per rectum  ASSESSMENT: Pt admitted with rectal bleeding, some nausea/vomiting, and abdominal pain. Pt reports eating 2 meals/day at home and but lost her appetite earlier this week. Pt reports 15 pound unintended weight loss in the past month which she thinks has been due to being in pain and eating less. Pt reports tolerating clear liquid diet, denies any nausea. Pt states she was trying to drink and [employer] protein shake three times/week PTA. Pt reports her abdominal pain is a little bit better.   Height: Ht Readings from Last 1 Encounters:  11/28/12 5' (1.524 m)    Weight: Wt Readings from Last 1 Encounters:  11/28/12 205 lb 14.6 oz (93.4 kg)    Ideal Body Weight: 100 lb  % Ideal Body Weight: 205  Wt Readings from Last 10 Encounters:  11/28/12 205 lb 14.6 oz (93.4 kg)    Usual Body Weight: 220 lb last month per pt  % Usual Body Weight: 93  BMI:  Body mass index is 40.21 kg/(m^2). Class III extreme obesity  Estimated Nutritional Needs: Kcal: 1200-1400 Protein: 45-55g Fluid: 1.2-1.4L/day  Skin: Intact  Diet Order: Clear Liquid  EDUCATION NEEDS: -No education needs identified at this time   Intake/Output Summary (Last 24 hours) at 11/28/12  1542 Last data filed at 11/28/12 1500  Gross per 24 hour  Intake   1285 ml  Output    700 ml  Net    585 ml    Last BM: 6/25  Labs:   Recent Labs Lab 11/27/12 2351  NA 136  K 3.9  CL 99  CO2 25  BUN 13  CREATININE 0.56  CALCIUM 9.7  GLUCOSE 174*    CBG (last 3)   Recent Labs  11/28/12 0831 11/28/12 1127  GLUCAP 153* 153*    Scheduled Meds: . gabapentin  300 mg Oral TID  . lisinopril  10 mg Oral Daily  . multivitamin with minerals  1 tablet Oral Daily  . pantoprazole  40 mg Oral Daily    Continuous Infusions: . sodium chloride 75 mL/hr at 11/28/12 1500    Past Medical History  Diagnosis Date  . Hemorrhoid     internal  . Neuropathy   . Diabetes mellitus without complication   . Hyperlipemia   . Stroke-like episode 2009    TIA  . Diverticulosis of colon   . Esophagitis     Past Surgical History  Procedure Laterality Date  . Tonsillectomy    . Abdominal hysterectomy       Levon Hedger MS, RD, LDN [phone] Pager [phone] After Hours Pager

## 2012-11-28 NOTE — Progress Notes (Signed)
06:10 on 11/28/12-Telephone report received from [facility] in ER dept. The pt is coming to Rm#[id number] to be admitted as med surgery pt. No telemetry monitoring ordered.

## 2012-11-28 NOTE — Progress Notes (Signed)
Pt had moderate amount of brown stool streak with bright red "blood" Olivia Romero, Olivia Romero

## 2012-11-29 DIAGNOSIS — E119 Type 2 diabetes mellitus without complications: Secondary | ICD-10-CM

## 2012-11-29 DIAGNOSIS — I10 Essential (primary) hypertension: Secondary | ICD-10-CM

## 2012-11-29 LAB — CBC
HCT: 37.2 % (ref 36.0–46.0)
Hemoglobin: 11.9 g/dL — ABNORMAL LOW (ref 12.0–15.0)
WBC: 8 10*3/uL (ref 4.0–10.5)

## 2012-11-29 LAB — BASIC METABOLIC PANEL
Chloride: 104 mEq/L (ref 96–112)
GFR calc Af Amer: >90 mL/min (ref >90)
Potassium: 4.1 mEq/L (ref 3.5–5.1)
Sodium: 138 mEq/L (ref 135–145)

## 2012-11-29 LAB — GLUCOSE, CAPILLARY
Glucose-Capillary: 124 mg/dL — ABNORMAL HIGH (ref 70–99)
Glucose-Capillary: 125 mg/dL — ABNORMAL HIGH (ref 70–99)
Glucose-Capillary: 135 mg/dL — ABNORMAL HIGH (ref 70–99)
Glucose-Capillary: 135 mg/dL — ABNORMAL HIGH (ref 70–99)
Glucose-Capillary: 150 mg/dL — ABNORMAL HIGH (ref 70–99)

## 2012-11-29 MED ORDER — PEG-KCL-NACL-NASULF-NA ASC-C 100 G PO SOLR: 1.0000 | Freq: Once | ORAL | Status: DC

## 2012-11-29 MED ORDER — PEG-KCL-NACL-NASULF-NA ASC-C 100 G PO SOLR
0.5000 | Freq: Once | ORAL | Status: AC
  Administered 2012-11-30: 50 g via ORAL
  Filled 2012-11-29: qty 1

## 2012-11-29 MED ORDER — LOPERAMIDE HCL 2 MG PO CAPS: 4.0000 mg | ORAL_CAPSULE | Freq: Four times a day (QID) | ORAL | Status: DC | PRN

## 2012-11-29 MED ORDER — PEG-KCL-NACL-NASULF-NA ASC-C 100 G PO SOLR
0.5000 | Freq: Once | ORAL | Status: AC
  Administered 2012-11-29: 50 g via ORAL
  Filled 2012-11-29: qty 1

## 2012-11-29 NOTE — Progress Notes (Signed)
[facility] Progress Note   Subjective  feels okay. She had a couple a small loose BMs with small amount of blood this am.    Objective   Vital signs in last 24 hours: Temp:  [97.8 F (36.6 C)-98.4 F (36.9 C)] 98.4 F (36.9 C) (06/27 0435) Pulse Rate:  [60-62] 60 (06/27 0435) Resp:  [16] 16 (06/27 0435) BP: (95-99)/(42-53) 99/53 mmHg (06/27 0435) SpO2:  [99 %-100 %] 99 % (06/27 0435) Last BM Date: 11/28/12 General:    white female in NAD Heart:  Regular rate and rhythm Abdomen:  Soft, obese, nondistended. Still with moderate LLQ tenderness. Normal bowel sounds. Extremities:  Without edema. Neurologic:  Alert and oriented,  grossly normal neurologically. Psych:  Cooperative. Normal mood and affect.   Lab Results:  Recent Labs  11/28/12 1433 11/28/12 2245 11/29/12 0650  WBC 8.1 9.0 8.0  HGB 12.8 11.4* 11.9*  HCT 39.5 35.8* 37.2  PLT 194 211 215   BMET  Recent Labs  11/27/12 2351 11/29/12 0650  NA 136 138  K 3.9 4.1  CL 99 104  CO2 25 27  GLUCOSE 174* 142*  BUN 13 7  CREATININE 0.56 0.50  CALCIUM 9.7 8.7   LFT  Recent Labs  11/27/12 2351  PROT 7.7  ALBUMIN 3.6  AST 18  ALT 21  ALKPHOS 92  BILITOT 0.1*     Assessment / Plan:   1. Rectal bleeding. Bleeding preceded by lower back pain which has resolved. No abdominal pain but she is still tender in LLQ.  This could be hemorrhoidal bleeding but unclear why LLQ tenderness. Diverticular hemorrhage, ischemic colitis, polyps / neoplasm all on differential diagnosis list as well.  Her colonoscopy was done 9 or so years ago by another GI in [place] who has since passed away. She saw Dr. Mechele Collin, (GI) in [place], a few years ago for an EGD. She would like to transfer care to [place]. Patient stable for discharge but now inquiring about having inpatient colonoscopy.  Will defer timing of procedure to Dr. Marina Goodell who will see her this am.     2. Anemia of acute blood loss, mild. Hgb actually up  overnight from 11.4 to 11.9.     LOS: 2 days   Willette Cluster  11/29/2012, 10:03 AM   GI ATTENDING  Patient personally seen and examined. Agree with H&P as outlined above. Trivial bleeding only. Complains of some discomfort in the lower abdomen and back. Very anxious. She is interested in proceeding with colonoscopy prior to discharge. We will plan on colonoscopy tomorrow morning. Orders written.The nature of the procedure, as well as the risks, benefits, and alternatives were carefully and thoroughly reviewed with the patient. Ample time for discussion and questions allowed. The patient understood, was satisfied, and agreed to proceed.  Wilhemina Bonito. Eda Keys., M.D. [facility] of Gastroenterology

## 2012-11-29 NOTE — Progress Notes (Signed)
[facility] PROGRESS NOTE  Olivia Romero WUJ:[id number] DOB: 1956-02-17 DOA: 11/27/2012 PCP: Dennison Mascot, MD  Brief narrative 57 yo WF with a PMHx of DM II, hyperlipidemia, TIA, diverticulosis, internal hemorrhoids, and esophagitis who presents to the [facility] ED with c/o 5 episodes of bright red rectal bleeding since Monday 6/23. She gives history of left-sided lower back pain that started prior to rectal bleeding and subsequent left lower quadrant abdominal pain. She denies nausea, vomiting, dizziness, lightheadedness, fever or chills. Initial BMs had some stool but subsequent ones was only blood. No clots. Last BM on 6/25 at 4 PM. Hx of documented internal hemorrhoids and diverticulosis per colonoscopy about 7 years ago per Dr. Maryruth Bun (deceased) in [place], [place]. Reports "several" episodes of diverticulitis over the years.  She takes ASA 325mg  per day since a TIA in 2009. No Plavix. Has continued her ASA since Monday. In the ED, her Hgb is normal. Rectal exam revealed positive guiac. Hospitalist admission requested.   Assessment/Plan: 1. Rectal bleeding: DD-hemorrhoidal/diverticular/ischemic colitis/cancer. Monitor bleeding. Hold ASA. IVF resuscitation. Should abd pain worsen or she develops fever, n/v-may need CT abd. She doesn't have a fever and her WBC is normal, so doubt diverticulitis. Started clear liquid diet-tolerated. [facility] consulted- Monitor for spontaneous cessation and may not require inpatient colonoscopy. Patient had couple of watery green stools with minimal blood between last night and this morning. Patient and family keen on getting colonoscopy prior to discharge. [facility] plans colonoscopy 6/28. Mild drop in hemoglobin. Follow CBC in a.m. 2. DM II-will hold Metformin while here and use SSI. Good inpatient control. 3. HTN-controlled, cont Lisinopril.  4. Hyperlipidemia-cont med  5. Hx TIA in 2009. Hold ASA.  6. VTE ppx-SCDs.  7. Hx esophagitis plus  #1-Protonix.   Code Status: Full Family Communication: None Disposition Plan: Home when medically stable   Consultants:  [facility]  Procedures:  None  Antibiotics:  None   HPI/Subjective: Seen this morning. Had 2 watery green stools with minimal blood between last night and this morning. Left lower back pain and left lower quadrant abdominal cramping decreased. Tolerating liquid diet without worsening abdominal pain.  Objective: Filed Vitals:   11/28/12 0630 11/28/12 2057 11/29/12 0435 11/29/12 1406  BP: 111/48 95/42 99/53  121/55  Pulse: 69 62 60 70  Temp: 98.1 F (36.7 C) 97.8 F (36.6 C) 98.4 F (36.9 C) 98 F (36.7 C)  TempSrc: Oral Oral Oral Oral  Resp: 16 16 16 16   Height: 5' (1.524 m)     Weight: 93.4 kg (205 lb 14.6 oz)     SpO2: 99% 100% 99% 100%    Intake/Output Summary (Last 24 hours) at 11/29/12 1735 Last data filed at 11/29/12 1453  Gross per 24 hour  Intake 2271.25 ml  Output   1800 ml  Net 471.25 ml   Filed Weights   11/27/12 2147 11/28/12 0630  Weight: 93.441 kg (206 lb) 93.4 kg (205 lb 14.6 oz)    Exam:   General exam: Comfortable.  Respiratory system: Clear. No increased work of breathing.  Cardiovascular system: S1 & S2 heard, RRR. No JVD, murmurs, gallops, clicks or pedal edema.  Gastrointestinal system: Abdomen is nondistended. Tender left lower quadrant (slightly less compared to yesterday) but without rigidity, guarding or rebound. Abdomen essentially soft. Normal bowel sounds heard.  Central nervous system: Alert and oriented. No focal neurological deficits.  Extremities: Symmetric 5 x 5 power.   Data Reviewed: Basic Metabolic Panel:  Recent Labs Lab 11/27/12 2351 11/29/12 9562  NA 136 138  K 3.9 4.1  CL 99 104  CO2 25 27  GLUCOSE 174* 142*  BUN 13 7  CREATININE 0.56 0.50  CALCIUM 9.7 8.7   Liver Function Tests:  Recent Labs Lab 11/27/12 2351  AST 18  ALT 21  ALKPHOS 92  BILITOT 0.1*  PROT 7.7   ALBUMIN 3.6   No results found for this basename: LIPASE, AMYLASE,  in the last 168 hours No results found for this basename: AMMONIA,  in the last 168 hours CBC:  Recent Labs Lab 11/27/12 2351 11/28/12 0730 11/28/12 1433 11/28/12 2245 11/29/12 0650  WBC 9.7 7.8 8.1 9.0 8.0  HGB 13.2 12.3 12.8 11.4* 11.9*  HCT 40.0 37.4 39.5 35.8* 37.2  MCV 82.0 82.9 83.2 83.3 84.5  PLT 262 228 194 211 215   Cardiac Enzymes: No results found for this basename: CKTOTAL, CKMB, CKMBINDEX, TROPONINI,  in the last 168 hours BNP (last 3 results) No results found for this basename: PROBNP,  in the last 8760 hours CBG:  Recent Labs Lab 11/29/12 0023 11/29/12 0433 11/29/12 0744 11/29/12 1133 11/29/12 1639  GLUCAP 124* 125* 135* 150* 116*    No results found for this or any previous visit (from the past 240 hour(s)).   Studies: No results found.   Additional labs:   Scheduled Meds: . gabapentin  300 mg Oral TID  . lisinopril  10 mg Oral Daily  . multivitamin with minerals  1 tablet Oral Daily  . pantoprazole  40 mg Oral Daily  . peg 3350 powder  0.5 kit Oral Once   And  . [START ON 11/30/2012] peg 3350 powder  0.5 kit Oral Once   Continuous Infusions: . sodium chloride 75 mL/hr at 11/29/12 1624    Principal Problem:   Bright red blood per rectum Active Problems:   Diabetes mellitus   Essential hypertension, benign   Malnutrition of moderate degree    Time spent: 25 minutes    [facility]  [facility] Pager [phone].   If 8PM-8AM, please contact night-coverage at [email or web address], password [facility] 11/29/2012, 5:35 PM  LOS: 2 days

## 2012-11-30 ENCOUNTER — Encounter (HOSPITAL_COMMUNITY): Payer: Self-pay | Admitting: Internal Medicine

## 2012-11-30 ENCOUNTER — Encounter (HOSPITAL_COMMUNITY)
Admission: EM | Disposition: A | Discharge status: Home / Self Care | Payer: Self-pay | Source: Home / Self Care | Attending: Internal Medicine

## 2012-11-30 DIAGNOSIS — K559 Vascular disorder of intestine, unspecified: Secondary | ICD-10-CM

## 2012-11-30 DIAGNOSIS — D126 Benign neoplasm of colon, unspecified: Secondary | ICD-10-CM

## 2012-11-30 DIAGNOSIS — K5730 Diverticulosis of large intestine without perforation or abscess without bleeding: Secondary | ICD-10-CM

## 2012-11-30 HISTORY — PX: COLONOSCOPY: SHX5424

## 2012-11-30 LAB — GLUCOSE, CAPILLARY
Glucose-Capillary: 125 mg/dL — ABNORMAL HIGH (ref 70–99)
Glucose-Capillary: 133 mg/dL — ABNORMAL HIGH (ref 70–99)
Glucose-Capillary: 127 mg/dL — ABNORMAL HIGH (ref 70–99)

## 2012-11-30 LAB — CBC
MCH: 26.3 pg (ref 26.0–34.0)
MCHC: 31.4 g/dL (ref 30.0–36.0)
MCV: 83.8 fL (ref 78.0–100.0)
Platelets: 237 10*3/uL (ref 150–400)
RBC: 4.64 MIL/uL (ref 3.87–5.11)
RDW: 13.2 % (ref 11.5–15.5)

## 2012-11-30 SURGERY — COLONOSCOPY
Anesthesia: Moderate Sedation

## 2012-11-30 MED ORDER — FENTANYL CITRATE 0.05 MG/ML IJ SOLN
INTRAMUSCULAR | Status: DC | PRN
  Administered 2012-11-30: 15 ug via INTRAVENOUS
  Administered 2012-11-30 (×3): 25 ug via INTRAVENOUS
  Administered 2012-11-30: 10 ug via INTRAVENOUS

## 2012-11-30 MED ORDER — ACETAMINOPHEN 325 MG PO TABS: 650.0000 mg | ORAL_TABLET | Freq: Four times a day (QID) | ORAL | Status: DC | PRN

## 2012-11-30 MED ORDER — MIDAZOLAM HCL 5 MG/5ML IJ SOLN
INTRAMUSCULAR | Status: DC | PRN
  Administered 2012-11-30 (×6): 2 mg via INTRAVENOUS

## 2012-11-30 MED ORDER — HYDROCODONE-ACETAMINOPHEN 5-325 MG PO TABS: 1.0000 | ORAL_TABLET | Freq: Three times a day (TID) | ORAL | Status: DC | PRN

## 2012-11-30 NOTE — Progress Notes (Signed)
Pt discharged to home. Dc instructions given using teachback. No concerns voiced. Prescriptions given for pain meds. Left unit in wheelchair pushed by nurse tech accompanied by brother and sister in law. Left in good condition. Vwilliams,rn.

## 2012-11-30 NOTE — Discharge Summary (Signed)
Physician Discharge Summary  Ninel Abdella ZOX:[id number] DOB: 05/02/1956 DOA: 11/27/2012  PCP: Dennison Mascot, MD  Admit date: 11/27/2012 Discharge date: 11/30/2012  Time spent: Less than 30 minutes  Recommendations for Outpatient Follow-up:  1. Dr. Dennison Mascot, PCP in 5 days. 2. Dr. Yancey Flemings, GI in 2 weeks. 3. Aspirin currently held secondary to rectal bleeding. Resume at PCPs discretion. 4. Followup colonic polyp biopsy results  Discharge Diagnoses:  Principal Problem:   Bright red blood per rectum Active Problems:   Diabetes mellitus   Essential hypertension, benign   Malnutrition of moderate degree   Benign neoplasm of colon   Ischemic colitis   Diverticulosis of colon (without mention of hemorrhage)   Discharge Condition: Improved & Stable  Diet recommendation: Light diet for a few days and advance as tolerated, diabetic diet   Filed Weights   11/27/12 2147 11/28/12 0630  Weight: 93.441 kg (206 lb) 93.4 kg (205 lb 14.6 oz)    History of present illness:  57 yo WF with a PMHx of DM II, hyperlipidemia, TIA, diverticulosis, internal hemorrhoids, and esophagitis who presents to the [facility] ED with c/o 5 episodes of bright red rectal bleeding since Monday 6/23. She gives history of left-sided lower back pain that started prior to rectal bleeding and subsequent left lower quadrant abdominal pain. She denies nausea, vomiting, dizziness, lightheadedness, fever or chills. Initial BMs had some stool but subsequent ones was only blood. No clots. Last BM on 6/25 at 4 PM. Hx of documented internal hemorrhoids and diverticulosis per colonoscopy about 7 years ago per Dr. Maryruth Bun (deceased) in [place], [place]. Reports "several" episodes of diverticulitis over the years.  She takes ASA 325mg  per day since a TIA in 2009. No Plavix. Has continued her ASA since Monday. In the ED, her Hgb is normal. Rectal exam revealed positive guiac. Hospitalist admission requested.  Hospital Course:    Rectal bleeding: Likely secondary to mild ischemic colitis. Patient was admitted to the hospital. She was managed conservatively with bowel rest, IV fluids and pain management. Hemoglobins remained stable. [facility] was consulted. They performed colonoscopy on 6/28 with findings as stated below. They have cleared her for discharge home to followup OP. Rectal bleeding has resolved. Patient has tolerated diet post procedure.    DM II-was on SSI inpatient. Resume metformin at discharge.   HTN-controlled, cont Lisinopril.   Hyperlipidemia-cont med   Hx TIA in 2009. Aspirin currently held secondary to problem #1. Resume outpatient in a few days  Hx esophagitis plus #1-Protonix.  Colonic polyps: Follow pathology OP.  Colonic diverticulosis   Procedures:  Colonoscopy 11/30/12 ENDOSCOPIC IMPRESSION:  1. Two diminutive polyps were found at the cecum and in the descending colon and removed with a cold snare  2. Semi-pedunculated inflammed polyp in the sigmoid colon was removed using snare cautery  3. Possible mild ischemic colitis in short segment of sigmoid. biopsied area  3. Moderate diverticulosis was noted throughout the entire examined colon   RECOMMENDATIONS:  1. Await pathology results  2. Call office for follow-up appointment with Dr Marina Goodell in 2 weeks to review results. Call [phone]  3. Ok to go home today with family. Light diet for a few days and advance as tolerated   Consultations:  [facility]  Discharge Exam:  Complaints: Mild intermittent low back pain and left lower quadrant pain. No more rectal bleeding.  Filed Vitals:   11/30/12 1100 11/30/12 1115 11/30/12 1144 11/30/12 1434  BP: 189/109 134/69 108/89 122/50  Pulse: 97 76  74 82  Temp:   97.9 F (36.6 C) 97.8 F (36.6 C)  TempSrc:   Oral Oral  Resp: 78 8 11 15   Height:      Weight:      SpO2: 100% 99% 100% 99%    General exam: Comfortable.  Respiratory system: Clear. No increased work  of breathing.  Cardiovascular system: S1 & S2 heard, RRR. No JVD, murmurs, gallops, clicks or pedal edema.  Gastrointestinal system: Abdomen is nondistended. Tender left lower quadrant-mild, but without rigidity, guarding or rebound. Abdomen essentially soft. Normal bowel sounds heard.  Central nervous system: Alert and oriented. No focal neurological deficits.  Extremities: Symmetric 5 x 5 power.   Discharge Instructions      Discharge Orders   Future Orders Complete By Expires     Call MD for:  extreme fatigue  As directed     Call MD for:  persistant dizziness or light-headedness  As directed     Call MD for:  persistant nausea and vomiting  As directed     Call MD for:  severe uncontrolled pain  As directed     Call MD for:  temperature >100.4  As directed     Call MD for:  As directed     Comments:      Worsening rectal bleeding.    Diet Carb Modified  As directed     Discharge instructions  As directed     Comments:      DIET: Light diet for a few days and then advance as tolerated    Increase activity slowly  As directed         Medication List    STOP taking these medications       acetaminophen 650 MG CR tablet  Commonly known as:  TYLENOL  Replaced by:  acetaminophen 325 MG tablet     aspirin 325 MG tablet      TAKE these medications       acetaminophen 325 MG tablet  Commonly known as:  TYLENOL  Take 2 tablets (650 mg total) by mouth every 6 (six) hours as needed for pain or fever (Mild pain).     albuterol 108 (90 BASE) MCG/ACT inhaler  Commonly known as:  PROVENTIL HFA;VENTOLIN HFA  Inhale 2 puffs into the lungs every 6 (six) hours as needed for wheezing or shortness of breath.     gabapentin 300 MG capsule  Commonly known as:  NEURONTIN  Take 300 mg by mouth 3 (three) times daily.     HYDROcodone-acetaminophen 5-325 MG per tablet  Commonly known as:  NORCO/VICODIN  Take 1 tablet by mouth every 8 (eight) hours as needed for pain (Moderate-severe  pain.).     lisinopril 10 MG tablet  Commonly known as:  PRINIVIL,ZESTRIL  Take 10 mg by mouth daily.     metFORMIN 500 MG tablet  Commonly known as:  GLUCOPHAGE  Take 500 mg by mouth 2 (two) times daily with a meal.     multivitamin with minerals Tabs  Take 1 tablet by mouth daily.     niacin 500 MG CR tablet  Commonly known as:  NIASPAN  Take 500 mg by mouth at bedtime.     omega-3 acid ethyl esters 1 G capsule  Commonly known as:  LOVAZA  Take 2 g by mouth 2 (two) times daily.       Follow-up Information   Follow up with MORRISEY,LEMONT, MD. Schedule an appointment as soon as possible  for a visit in 5 days.   Contact information:   [facility] [address] [place] [address] [phone]       Follow up with Yancey Flemings, MD. Schedule an appointment as soon as possible for a visit in 2 weeks.   Contact information:   [address] [place] [address] [phone]        The results of significant diagnostics from this hospitalization (including imaging, microbiology, ancillary and laboratory) are listed below for reference.    Significant Diagnostic Studies: No results found.  Microbiology: No results found for this or any previous visit (from the past 240 hour(s)).   Labs: Basic Metabolic Panel:  Recent Labs Lab 11/27/12 2351 11/29/12 0650  NA 136 138  K 3.9 4.1  CL 99 104  CO2 25 27  GLUCOSE 174* 142*  BUN 13 7  CREATININE 0.56 0.50  CALCIUM 9.7 8.7   Liver Function Tests:  Recent Labs Lab 11/27/12 2351  AST 18  ALT 21  ALKPHOS 92  BILITOT 0.1*  PROT 7.7  ALBUMIN 3.6   No results found for this basename: LIPASE, AMYLASE,  in the last 168 hours No results found for this basename: AMMONIA,  in the last 168 hours CBC:  Recent Labs Lab 11/28/12 0730 11/28/12 1433 11/28/12 2245 11/29/12 0650 11/30/12 0540  WBC 7.8 8.1 9.0 8.0 9.4  HGB 12.3 12.8 11.4* 11.9* 12.2  HCT 37.4 39.5 35.8* 37.2  38.9  MCV 82.9 83.2 83.3 84.5 83.8  PLT 228 194 211 215 237   Cardiac Enzymes: No results found for this basename: CKTOTAL, CKMB, CKMBINDEX, TROPONINI,  in the last 168 hours BNP: BNP (last 3 results) No results found for this basename: PROBNP,  in the last 8760 hours CBG:  Recent Labs Lab 11/29/12 2002 11/29/12 2359 11/30/12 0352 11/30/12 0751 11/30/12 1148  GLUCAP 135* 139* 125* 133* 127*    Additional labs:    Signed:  Patrese Neal  [facility] 11/30/2012, 2:48 PM

## 2012-11-30 NOTE — H&P (View-Only) (Signed)
[facility] Progress Note   Subjective  feels okay. She had a couple a small loose BMs with small amount of blood this am.    Objective   Vital signs in last 24 hours: Temp:  [97.8 F (36.6 C)-98.4 F (36.9 C)] 98.4 F (36.9 C) (06/27 0435) Pulse Rate:  [60-62] 60 (06/27 0435) Resp:  [16] 16 (06/27 0435) BP: (95-99)/(42-53) 99/53 mmHg (06/27 0435) SpO2:  [99 %-100 %] 99 % (06/27 0435) Last BM Date: 11/28/12 General:    white female in NAD Heart:  Regular rate and rhythm Abdomen:  Soft, obese, nondistended. Still with moderate LLQ tenderness. Normal bowel sounds. Extremities:  Without edema. Neurologic:  Alert and oriented,  grossly normal neurologically. Psych:  Cooperative. Normal mood and affect.   Lab Results:  Recent Labs  11/28/12 1433 11/28/12 2245 11/29/12 0650  WBC 8.1 9.0 8.0  HGB 12.8 11.4* 11.9*  HCT 39.5 35.8* 37.2  PLT 194 211 215   BMET  Recent Labs  11/27/12 2351 11/29/12 0650  NA 136 138  K 3.9 4.1  CL 99 104  CO2 25 27  GLUCOSE 174* 142*  BUN 13 7  CREATININE 0.56 0.50  CALCIUM 9.7 8.7   LFT  Recent Labs  11/27/12 2351  PROT 7.7  ALBUMIN 3.6  AST 18  ALT 21  ALKPHOS 92  BILITOT 0.1*     Assessment / Plan:   1. Rectal bleeding. Bleeding preceded by lower back pain which has resolved. No abdominal pain but she is still tender in LLQ.  This could be hemorrhoidal bleeding but unclear why LLQ tenderness. Diverticular hemorrhage, ischemic colitis, polyps / neoplasm all on differential diagnosis list as well.  Her colonoscopy was done 9 or so years ago by another GI in [place] who has since passed away. She saw Dr. Elliott, (GI) in [place], a few years ago for an EGD. She would like to transfer care to us. Patient stable for discharge but now inquiring about having inpatient colonoscopy.  Will defer timing of procedure to Dr. Kylani Wires who will see her this am.     2. Anemia of acute blood loss, mild. Hgb actually up  overnight from 11.4 to 11.9.     LOS: 2 days   Paula Guenther  11/29/2012, 10:03 AM   GI ATTENDING  Patient personally seen and examined. Agree with H&P as outlined above. Trivial bleeding only. Complains of some discomfort in the lower abdomen and back. Very anxious. She is interested in proceeding with colonoscopy prior to discharge. We will plan on colonoscopy tomorrow morning. Orders written.The nature of the procedure, as well as the risks, benefits, and alternatives were carefully and thoroughly reviewed with the patient. Ample time for discussion and questions allowed. The patient understood, was satisfied, and agreed to proceed.  Kentrell Guettler N. Renita Brocks, Jr., M.D. [facility] of Gastroenterology 

## 2012-11-30 NOTE — Op Note (Signed)
[facility] [address] [place], [address]   COLONOSCOPY PROCEDURE REPORT  PATIENT: Olivia, Romero  MR#: [id number] BIRTHDATE: 1955/11/06 , 57  yrs. old GENDER: Female ENDOSCOPIST: Roxy Cedar, MD REFERRED AO:[facility] PROCEDURE DATE:  11/30/2012 PROCEDURE:   Colonoscopy with snare polypectomy x 3 and Colonoscopy with biopsies (of a different inflammatory area) ASA CLASS:   Class II INDICATIONS:rectal bleeding. MEDICATIONS: Fentanyl 125 mcg IV and Versed 12 mg IV  DESCRIPTION OF PROCEDURE:   After the risks benefits and alternatives of the procedure were thoroughly explained, informed consent was obtained.  A digital rectal exam revealed no abnormalities of the rectum.   The Pentax Colonoscope [id number] endoscope was introduced through the anus and advanced to the cecum, which was identified by both the appendix and ileocecal valve. No adverse events experienced.   The quality of the prep was good, using MoviPrep  The instrument was then slowly withdrawn as the colon was fully examined.   COLON FINDINGS: Two diminutive polyps were found at the cecum and in the descending colon.  A polypectomy was performed with a cold snare.  The resection was complete and the polyp tissue was completely retrieved.   A semi-pedunculated polyp 5mm inflammatory appearing in the sigmoid colon was removed with snare cautery.  The resection was complete and the polyp tissue was completely retrieved. A 7cm segment of sigmoid colon was edematous with inflammatory nodules. These were bx'ed.  Moderate diverticulosis was noted throughout the entire examined colon.  Retroflexed views revealed moderate internal hemorrhoids. The time to cecum=4 minutes 0 seconds.  Withdrawal time=19 minutes 0 seconds.  The scope was withdrawn and the procedure completed. COMPLICATIONS: There were no complications.  ENDOSCOPIC IMPRESSION: 1.   Two diminutive polyps were found at the  cecum and in the descending colon and removed with a cold snare 2.   Semi-pedunculated inflammed polyp in the sigmoid colon was removed using snare cautery 3.   Possible mild ischemic colitis in short segment of sigmoid. biopsied area 3.   Moderate diverticulosis was noted throughout the entire examined colon  RECOMMENDATIONS: 1.  Await pathology results 2.  Call office for follow-up appointment with Dr Marina Goodell in 2 weeks to review results. Call [phone] 3. Ok to go home today with family. Light diet for a few days and advance as tolerated   eSigned:  Roxy Cedar, MD 11/30/2012 11:24 AM cc: The Patient   PATIENT NAME:  Olivia, Romero MR#: [id number]

## 2012-11-30 NOTE — Interval H&P Note (Signed)
History and Physical Interval Note:  11/30/2012 10:31 AM  Olivia Romero  has presented today for surgery, with the diagnosis of rectal bleeding  The various methods of treatment have been discussed with the patient and family. After consideration of risks, benefits and other options for treatment, the patient has consented to  Procedure(s): COLONOSCOPY (N/A) as a surgical intervention .  The patient's history has been reviewed, patient examined, no change in status, stable for surgery.  I have reviewed the patient's chart and labs.  Questions were answered to the patient's satisfaction.     Yancey Flemings

## 2012-12-02 ENCOUNTER — Telehealth: Payer: Self-pay | Admitting: Internal Medicine

## 2012-12-02 ENCOUNTER — Encounter (HOSPITAL_COMMUNITY): Payer: Self-pay | Admitting: Internal Medicine

## 2012-12-02 NOTE — Telephone Encounter (Signed)
Rectal bleeding due to to distal biopsies/polyp removal. I expect her to see a little blood. Office followup in 2 weeks. Thanks

## 2012-12-02 NOTE — Telephone Encounter (Signed)
Patient advised.  Office visit scheduled for 12/13/12 8:30

## 2012-12-02 NOTE — Telephone Encounter (Signed)
Patient reports continued bleeding and pain.  She is having cramping and pain.  She has had 4 semi- formed stools with bright red blood since 8:00 this am.  There is no more bleeding than there was prior to admission, the pain is "griping" like in her side.  She denies fever or passing blood independent of stool.

## 2012-12-03 ENCOUNTER — Encounter: Payer: Self-pay | Admitting: Internal Medicine

## 2012-12-13 ENCOUNTER — Encounter: Payer: Self-pay | Admitting: Internal Medicine

## 2012-12-13 ENCOUNTER — Ambulatory Visit (INDEPENDENT_AMBULATORY_CARE_PROVIDER_SITE_OTHER): Payer: 59 | Admitting: Internal Medicine

## 2012-12-13 VITALS — BP 114/70 | HR 80 | Ht 60.0 in | Wt 202.5 lb

## 2012-12-13 DIAGNOSIS — K5730 Diverticulosis of large intestine without perforation or abscess without bleeding: Secondary | ICD-10-CM

## 2012-12-13 DIAGNOSIS — D126 Benign neoplasm of colon, unspecified: Secondary | ICD-10-CM

## 2012-12-13 DIAGNOSIS — K648 Other hemorrhoids: Secondary | ICD-10-CM

## 2012-12-13 DIAGNOSIS — K5900 Constipation, unspecified: Secondary | ICD-10-CM

## 2012-12-13 DIAGNOSIS — K625 Hemorrhage of anus and rectum: Secondary | ICD-10-CM

## 2012-12-13 NOTE — Progress Notes (Signed)
HISTORY OF PRESENT ILLNESS:  Olivia Romero is a 57 y.o. female with the below listed medical history who presents today for post hospitalization followup. She was hospitalized 11/28/2012 rectal bleeding. She had been having some loose stools and minor abdominal discomfort. On 11/22/2012 she underwent complete colonoscopy. She was found to have 2 diminutive adenomas in the colon which were removed. As well moderate pandiverticulosis with mild inflammatory changes in the sigmoid region with inflammatory-appearing polyps which were removed and biopsied. Pathology confirmed the same. She presents today for followup. Patient is happy to report that for symptoms have resolved. No abdominal pain or bleeding. Her bowels have reverted back to her baseline constipation for which she is anticipating resuming her MiraLax. Early non-GI complaint is that of fatigue. We reviewed her procedure findings and pathology in detail. Discharge hemoglobin was 12.2. Her PCP wanted her to inquire about her ability to resume her daily aspirin. From a GI standpoint, this is OK.  REVIEW OF SYSTEMS:  All non-GI ROS negative except for otherwise, back pain, depression, fatigue  Past Medical History  Diagnosis Date  . Hemorrhoid     internal  . Neuropathy   . Diabetes mellitus without complication   . Hyperlipemia   . Stroke-like episode 2009    TIA  . Diverticulosis of colon   . Esophagitis   . Colon polyps     adenomatous    Past Surgical History  Procedure Laterality Date  . Tonsillectomy    . Abdominal hysterectomy    . Colonoscopy N/A 11/30/2012    Procedure: COLONOSCOPY;  Surgeon: Hilarie Fredrickson, MD;  Location: [facility];  Service: Endoscopy;  Laterality: N/A;  . Cesarean section  1986    Social History Olivia Romero  reports that she has never smoked. She has never used smokeless tobacco. She reports that  drinks alcohol. She reports that she does not use illicit drugs.  family history includes  Arthritis in her brother and father; Hypertension in her father and mother; Lung cancer in her father; Other in her mothers; Rheum arthritis in her maternal uncle; and Stroke in her mother.  Allergies  Allergen Reactions  . Atorvastatin     Unknown bothers liver  . Latex Itching  . Pravastatin Itching and Rash       PHYSICAL EXAMINATION: Vital signs: BP 114/70  Pulse 80  Ht 5' (1.524 m)  Wt 202 lb 8 oz (91.853 kg)  BMI 39.55 kg/m2 General: Well-developed, well-nourished, no acute distress HEENT: Sclerae are anicteric, conjunctiva pink. Oral mucosa intact Lungs: Clear Heart: Regular Abdomen: soft, obese, nontender, nondistended, no obvious ascites, no peritoneal signs, normal bowel sounds. No organomegaly. Extremities: No edema Psychiatric: alert and oriented x3. Cooperative   ASSESSMENT:  #1. Recent problems with minor lower GI bleeding secondary to hemorrhoids or mild diverticular colitis #2. Adenomatous colon polyps #3. Functional constipation   PLAN:  #1. MiraLax for constipation #2. Okay to resume aspirin, from GI perspective #3. Surveillance colonoscopy in 5 years. #4. Resume general medical care with PCP. Interval GI followup as needed

## 2012-12-13 NOTE — Patient Instructions (Addendum)
Please follow up with Dr. Perry as needed 

## 2013-12-11 ENCOUNTER — Emergency Department: Payer: Self-pay | Admitting: Emergency Medicine

## 2013-12-12 ENCOUNTER — Ambulatory Visit: Payer: Self-pay | Admitting: Specialist

## 2014-07-06 ENCOUNTER — Ambulatory Visit: Payer: Self-pay | Admitting: Family Medicine

## 2014-07-08 ENCOUNTER — Ambulatory Visit: Payer: Self-pay | Admitting: Family Medicine

## 2014-07-09 ENCOUNTER — Ambulatory Visit: Payer: Self-pay | Admitting: Family Medicine

## 2014-07-24 LAB — HM DIABETES EYE EXAM

## 2014-08-04 ENCOUNTER — Ambulatory Visit
Admit: 2014-08-04 | Disposition: A | Discharge status: Home / Self Care | Payer: Self-pay | Attending: Family Medicine | Admitting: Family Medicine

## 2014-08-14 ENCOUNTER — Ambulatory Visit: Payer: Self-pay | Admitting: Family Medicine

## 2014-11-05 ENCOUNTER — Telehealth: Payer: Self-pay | Admitting: Family Medicine

## 2014-11-05 DIAGNOSIS — M1460 Charcot's joint, unspecified site: Secondary | ICD-10-CM

## 2014-11-06 NOTE — Telephone Encounter (Signed)
Pt has called back again wanting a refill on RX

## 2014-11-09 MED ORDER — HYDROCODONE-ACETAMINOPHEN 5-325 MG PO TABS
1.0000 | ORAL_TABLET | Freq: Three times a day (TID) | ORAL | Status: DC | PRN
Start: 2014-11-09 — End: 2015-07-27

## 2014-11-09 NOTE — Telephone Encounter (Signed)
Pt has called back again wanting a refill on RX for hydrocodone for her neuropathy.

## 2014-12-01 ENCOUNTER — Encounter: Payer: Self-pay | Admitting: Family Medicine

## 2014-12-01 ENCOUNTER — Ambulatory Visit (INDEPENDENT_AMBULATORY_CARE_PROVIDER_SITE_OTHER): Payer: 59 | Admitting: Family Medicine

## 2014-12-01 VITALS — BP 138/74 | HR 91 | Temp 98.1°F | Resp 16 | Ht 61.0 in | Wt 202.0 lb

## 2014-12-01 DIAGNOSIS — G5602 Carpal tunnel syndrome, left upper limb: Secondary | ICD-10-CM

## 2014-12-01 DIAGNOSIS — E1140 Type 2 diabetes mellitus with diabetic neuropathy, unspecified: Secondary | ICD-10-CM

## 2014-12-01 DIAGNOSIS — E11610 Type 2 diabetes mellitus with diabetic neuropathic arthropathy: Secondary | ICD-10-CM

## 2014-12-01 DIAGNOSIS — I10 Essential (primary) hypertension: Secondary | ICD-10-CM

## 2014-12-01 DIAGNOSIS — R110 Nausea: Secondary | ICD-10-CM | POA: Diagnosis not present

## 2014-12-01 DIAGNOSIS — G5603 Carpal tunnel syndrome, bilateral upper limbs: Secondary | ICD-10-CM | POA: Insufficient documentation

## 2014-12-01 DIAGNOSIS — G5601 Carpal tunnel syndrome, right upper limb: Secondary | ICD-10-CM | POA: Diagnosis not present

## 2014-12-01 MED ORDER — ONDANSETRON HCL 4 MG PO TABS: 4.0000 mg | ORAL_TABLET | Freq: Three times a day (TID) | ORAL | Status: DC | PRN

## 2014-12-01 NOTE — Patient Instructions (Signed)
1 mo 

## 2014-12-01 NOTE — Progress Notes (Signed)
Name: Olivia Romero   MRN: [id number]    DOB: Sep 30, 1955   Date:12/01/2014       Progress Note  Subjective  Chief Complaint  Chief Complaint  Patient presents with  . Abdominal Pain  . Hand Pain    due to diabetic neuropathy. Fells like pins and needles are sticking her hands, painful, swellling    Abdominal Pain This is a recurrent problem. The current episode started in the past 7 days. The onset quality is gradual. The problem occurs intermittently. The pain is located in the epigastric region. The pain is mild. The quality of the pain is aching and burning. The abdominal pain does not radiate. Associated symptoms include nausea. Pertinent negatives include no constipation, diarrhea, dysuria, fever, frequency, headaches, hematochezia, hematuria, melena, myalgias, vomiting or weight loss. Nothing aggravates the pain. She has tried antacids for the symptoms. The treatment provided moderate relief. Her past medical history is significant for GERD.  Hand Pain  The incident occurred more than 1 week ago. The pain is present in the right hand and left hand. The quality of the pain is described as shooting and burning. The pain does not radiate. The pain is moderate. The pain has been fluctuating since the incident. Associated symptoms include numbness and tingling. Pertinent negatives include no chest pain. The symptoms are aggravated by lifting, movement and palpation. The treatment provided no relief.   Nausea  Recurrent problem with nausea associated some mild abdominal discomfort. It is not particularly occurring after meals though there is a suspicion that she probably is developing some diabetic gastroparesis in view of her long-standing diabetes with poor control and compliance.   Past Medical History  Diagnosis Date  . Hemorrhoid     internal  . Neuropathy   . Diabetes mellitus without complication   . Hyperlipemia   . Stroke-like episode 2009    TIA  . Diverticulosis of colon    . Esophagitis   . Colon polyps     adenomatous  . Hypertension     History  Substance Use Topics  . Smoking status: Never Smoker   . Smokeless tobacco: Never Used  . Alcohol Use: 0.0 oz/week    0 Standard drinks or equivalent per week     Comment: wine every 6-8 months     Current outpatient prescriptions:  .  acetaminophen (TYLENOL) 325 MG tablet, Take 2 tablets (650 mg total) by mouth every 6 (six) hours as needed for pain or fever (Mild pain)., Disp: , Rfl:  .  albuterol (PROVENTIL HFA;VENTOLIN HFA) 108 (90 BASE) MCG/ACT inhaler, Inhale 2 puffs into the lungs every 6 (six) hours as needed for wheezing or shortness of breath., Disp: , Rfl:  .  ALPRAZolam (XANAX) 0.5 MG tablet, Take 0.5 mg by mouth at bedtime as needed for anxiety., Disp: , Rfl:  .  aspirin 325 MG tablet, Take 325 mg by mouth daily., Disp: , Rfl:  .  gabapentin (NEURONTIN) 300 MG capsule, Take 300 mg by mouth 3 (three) times daily., Disp: , Rfl:  .  HYDROcodone-acetaminophen (NORCO/VICODIN) 5-325 MG per tablet, Take 1 tablet by mouth every 8 (eight) hours as needed., Disp: 15 tablet, Rfl: 0 .  Icosapent Ethyl (VASCEPA) 1 G CAPS, Take 1,000 mg by mouth 3 (three) times daily., Disp: , Rfl:  .  lisinopril (PRINIVIL,ZESTRIL) 10 MG tablet, Take 10 mg by mouth daily., Disp: , Rfl:  .  metFORMIN (GLUCOPHAGE) 500 MG tablet, Take 500 mg by mouth 2 (two)  times daily with a meal., Disp: , Rfl:  .  Multiple Vitamin (MULTIVITAMIN WITH MINERALS) TABS, Take 1 tablet by mouth daily., Disp: , Rfl:  .  omega-3 acid ethyl esters (LOVAZA) 1 G capsule, Take 2 g by mouth 2 (two) times daily., Disp: , Rfl:   Allergies  Allergen Reactions  . Atorvastatin     Unknown bothers liver  . Latex Itching  . Pravastatin Itching and Rash    Review of Systems  Constitutional: Negative for fever, chills and weight loss.  HENT: Negative for congestion, hearing loss, sore throat and tinnitus.   Eyes: Negative for blurred vision, double vision  and redness.  Respiratory: Negative for cough, hemoptysis and shortness of breath.   Cardiovascular: Negative for chest pain, palpitations, orthopnea, claudication and leg swelling.  Gastrointestinal: Positive for nausea and abdominal pain. Negative for heartburn, vomiting, diarrhea, constipation, blood in stool, melena and hematochezia.  Genitourinary: Negative for dysuria, urgency, frequency and hematuria.  Musculoskeletal: Negative for myalgias, back pain, joint pain, falls and neck pain.  Skin: Negative for itching.  Neurological: Positive for tingling, sensory change and numbness. Negative for dizziness, tremors, focal weakness, seizures, loss of consciousness, weakness and headaches.  Endo/Heme/Allergies: Does not bruise/bleed easily.  Psychiatric/Behavioral: Negative for depression and substance abuse. The patient is not nervous/anxious and does not have insomnia.      Objective  Filed Vitals:   12/01/14 0800  BP: 138/74  Pulse: 91  Temp: 98.1 F (36.7 C)  TempSrc: Oral  Resp: 16  Height: 5\' 1"  (1.549 m)  Weight: 202 lb (91.627 kg)  SpO2: 97%     Physical Exam  Constitutional: She is oriented to person, place, and time and well-developed, well-nourished, and in no distress.  Obese  HENT:  Head: Normocephalic.  Eyes: EOM are normal. Pupils are equal, round, and reactive to light.  Neck: Normal range of motion. No thyromegaly present.  Cardiovascular: Normal rate, regular rhythm and normal heart sounds.   No murmur heard. Pulmonary/Chest: Effort normal and breath sounds normal.  Abdominal: Soft. Bowel sounds are normal. She exhibits no distension and no mass. There is no tenderness. There is no rebound and no guarding.  Musculoskeletal: She exhibits no edema.  Positive Tinel's and Phalen's test  Neurological: She is alert and oriented to person, place, and time. No cranial nerve deficit. Gait normal.  Positive Phalen's and Tinel's test at the wrist  Skin: Skin is warm  and dry. No rash noted.  Psychiatric: Memory and affect normal.      Assessment & Plan  1. Essential hypertension, benign Stable  2. Type 2 diabetes mellitus with diabetic neuropathic arthropathy Worsening  3. Bilateral carpal tunnel syndrome Worsening - Ambulatory referral to Neurology - TSH - B12 - Folate  4. DM neuropathy, type II diabetes mellitus Worsening  5. Nausea Worsening. Possibly associated with early diabetic  Gastroparesis - ondansetron (ZOFRAN) 4 MG tablet; Take 1 tablet (4 mg total) by mouth every 8 (eight) hours as needed for nausea or vomiting.  Dispense: 20 tablet; Refill: 0

## 2014-12-03 ENCOUNTER — Ambulatory Visit (INDEPENDENT_AMBULATORY_CARE_PROVIDER_SITE_OTHER): Payer: 59 | Admitting: Cardiovascular Disease

## 2014-12-03 ENCOUNTER — Encounter: Payer: Self-pay | Admitting: Cardiovascular Disease

## 2014-12-03 VITALS — BP 158/78 | HR 76 | Ht 60.0 in | Wt 204.5 lb

## 2014-12-03 DIAGNOSIS — I471 Supraventricular tachycardia: Secondary | ICD-10-CM

## 2014-12-03 DIAGNOSIS — R000 Tachycardia, unspecified: Secondary | ICD-10-CM | POA: Diagnosis not present

## 2014-12-03 NOTE — Patient Instructions (Signed)
Medication Instructions: - no changes  Labwork: - none  Procedures/Testing: - none  Follow-Up: - Dr. Acie Fredrickson will see you back on an as needed basis.  Any Additional Special Instructions Will Be Listed Below (If Applicable). - none

## 2014-12-03 NOTE — Progress Notes (Signed)
Cardiology Office Note   Date:  12/03/2014   ID:  Olivia Romero, DOB [birth year], MRN [id number]  PCP:  Ashok Norris, MD  Cardiologist:   Thayer Headings, MD   Chief Complaint  Patient presents with  . other    Ref by Dr. Rutherford Nail for tachycardia. Pt. c/o left arm aches, right hand feels cold with numbness in fingers.  Meds reviewed by the patient verbally.    Problem List 1. Diabetes mellitus     History of Present Illness: Olivia Romero is a 59 y.o. female who presents for further evaluation of an episode of tachycardia ( HR was 104 initially  in an office visit, was 79 by the time the ECG was done )  No CP, no dyspnea . 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  Walks during lunch break .    Past Medical History  Diagnosis Date  . Hemorrhoid     internal  . Neuropathy   . Diabetes mellitus without complication   . Hyperlipemia   . Stroke-like episode 2009    TIA  . Diverticulosis of colon   . Esophagitis   . Colon polyps     adenomatous  . Hypertension   . Carpal tunnel syndrome on right     Past Surgical History  Procedure Laterality Date  . Tonsillectomy    . Abdominal hysterectomy    . Colonoscopy N/A 11/30/2012    Procedure: COLONOSCOPY;  Surgeon: Irene Shipper, MD;  Location: [facility];  Service: Endoscopy;  Laterality: N/A;  . Cesarean section  1986     Current Outpatient Prescriptions  Medication Sig Dispense Refill  . acetaminophen (TYLENOL) 325 MG tablet Take 2 tablets (650 mg total) by mouth every 6 (six) hours as needed for pain or fever (Mild pain).    [name] albuterol (PROVENTIL HFA;VENTOLIN HFA) 108 (90 BASE) MCG/ACT inhaler Inhale 2 puffs into the lungs every 6 (six) hours as needed for wheezing or shortness of breath.    . ALPRAZolam (XANAX) 0.5 MG tablet Take 0.5 mg by mouth at bedtime as needed for anxiety.    [name] aspirin 325 MG tablet Take 325 mg by mouth daily.    [name]  gabapentin (NEURONTIN) 300 MG capsule Take 300 mg by mouth 4 (four) times daily.     [name] glimepiride (AMARYL) 2 MG tablet Take 2 mg by mouth daily with breakfast.    . HYDROcodone-acetaminophen (NORCO/VICODIN) 5-325 MG per tablet Take 1 tablet by mouth every 8 (eight) hours as needed. 15 tablet 0  . Icosapent Ethyl (VASCEPA) 1 G CAPS Take 1,000 mg by mouth 3 (three) times daily.    [name] lisinopril (PRINIVIL,ZESTRIL) 10 MG tablet Take 10 mg by mouth daily.    . meloxicam (MOBIC) 15 MG tablet Take 15 mg by mouth daily.    . metFORMIN (GLUCOPHAGE) 500 MG tablet Take 1,000 mg by mouth 2 (two) times daily with a meal.     . metoprolol succinate (TOPROL-XL) 25 MG 24 hr tablet Take 25 mg by mouth daily.    . Multiple Vitamin (MULTIVITAMIN WITH MINERALS) TABS Take 1 tablet by mouth daily.    [name] omega-3 acid ethyl esters (LOVAZA) 1 G capsule Take 2 g by mouth 2 (two) times daily.    . ondansetron (ZOFRAN) 4 MG tablet Take 1 tablet (4 mg total) by mouth every 8 (eight) hours as needed for nausea or  vomiting. 20 tablet 0   No current facility-administered medications for this visit.    Allergies:   Atorvastatin; Latex; and Pravastatin    Social History:  The patient  reports that she has never smoked. She has never used smokeless tobacco. She reports that she drinks alcohol. She reports that she does not use illicit drugs.   Family History:  The patient's family history includes Arthritis in her brother and father; Hypertension in her father and mother; Lung cancer in her father; Other in her mother; Rheum arthritis in her maternal uncle; Stroke in her mother.    ROS:  Please see the history of present illness.    Review of Systems: Constitutional:  denies fever, chills, diaphoresis, appetite change and fatigue.  HEENT: denies photophobia, eye pain, redness, hearing loss, ear pain, congestion, sore throat, rhinorrhea, sneezing, neck pain, neck stiffness and tinnitus.  Respiratory: denies SOB, DOE, cough,  chest tightness, and wheezing.  Cardiovascular: denies chest pain, palpitations and leg swelling.  Gastrointestinal: denies nausea, vomiting, abdominal pain, diarrhea, constipation, blood in stool.  Genitourinary: denies dysuria, urgency, frequency, hematuria, flank pain and difficulty urinating.  Musculoskeletal: denies  myalgias, back pain, joint swelling, arthralgias and gait problem.   Skin: denies pallor, rash and wound.  Neurological: denies dizziness, seizures, syncope, weakness, light-headedness, numbness and headaches.   Has a diabetic neuropathy.    Hematological: denies adenopathy, easy bruising, personal or family bleeding history.  Psychiatric/ Behavioral: denies suicidal ideation, mood changes, confusion, nervousness, sleep disturbance and agitation.       All other systems are reviewed and negative.    PHYSICAL EXAM: VS:  BP 158/78 mmHg  Pulse 76  Ht 5' (1.524 m)  Wt 92.761 kg (204 lb 8 oz)  BMI 39.94 kg/m2 , BMI Body mass index is 39.94 kg/(m^2). GEN: Well nourished, well developed, in no acute distress HEENT: normal Neck: no JVD, carotid bruits, or masses Cardiac: RRR; no murmurs, rubs, or gallops,no edema  Respiratory:  clear to auscultation bilaterally, normal work of breathing GI: soft, nontender, nondistended, + BS MS: no deformity or atrophy Skin: warm and dry, no rash Neuro:  Strength and sensation are intact Psych: normal   EKG:  EKG is ordered today. The ekg ordered today demonstrates : NSR at 79.  No ST or T wave changes    Recent Labs: No results found for requested labs within last 365 days.    Lipid Panel No results found for: CHOL, TRIG, HDL, CHOLHDL, VLDL, LDLCALC, LDLDIRECT    Wt Readings from Last 3 Encounters:  12/03/14 92.761 kg (204 lb 8 oz)  12/01/14 91.627 kg (202 lb)  12/13/12 91.853 kg (202 lb 8 oz)      Other studies Reviewed: Additional studies/ records that were reviewed today include: . Review of the above records  demonstrates:    ASSESSMENT AND PLAN:  1.  Sinus tachycardia:  The patient presents with an episode of sinus tachycardia- her heart rate was 104. After several minute weight heart rate was in the upper 70s. At this point I do not think that this brief episode of sinus tachycardia needs any further workup. She's completely asymptomatic from a cardia example.  She does have diabetes and is obese. I have encouraged her to ambulate and to watch her diet. She should follow-up with her general medical doctor. I'll see her again in the office as needed.   Current medicines are reviewed at length with the patient today.  The patient does not have concerns  regarding medicines.  The following changes have been made:  no change  Labs/ tests ordered today include:  Orders Placed This Encounter  Procedures  . EKG 12-Lead     Disposition:   FU with me as needed.      Nahser, Wonda Cheng, MD  12/03/2014 2:39 PM    [facility] [place], [place], [place]  [address] Phone: [phone]; Fax: [phone]   [facility]  [address] [place], [place]  [address] [phone]    Fax [phone]

## 2014-12-08 ENCOUNTER — Other Ambulatory Visit: Payer: Self-pay

## 2014-12-08 ENCOUNTER — Telehealth: Payer: Self-pay | Admitting: Family Medicine

## 2014-12-08 MED ORDER — GABAPENTIN 300 MG PO CAPS: 300.0000 mg | ORAL_CAPSULE | Freq: Three times a day (TID) | ORAL | Status: DC

## 2014-12-08 NOTE — Telephone Encounter (Signed)
Patient needs refill on Neurotin to be sent to [facility]. She states since her meds have been increased in dosage she will run out before her next appt.

## 2014-12-10 ENCOUNTER — Other Ambulatory Visit: Payer: Self-pay | Admitting: Emergency Medicine

## 2014-12-11 MED ORDER — GABAPENTIN 300 MG PO CAPS: 300.0000 mg | ORAL_CAPSULE | Freq: Three times a day (TID) | ORAL | Status: DC

## 2014-12-24 ENCOUNTER — Telehealth: Payer: Self-pay | Admitting: Family Medicine

## 2014-12-24 NOTE — Telephone Encounter (Signed)
PT THINKS THAT SHE IS HAVING A SIDE AFFECT OF HER REGLAN MEDICATION MAKES HER OFF BALANCE AND SICK TO STOMACH. WANTS TO KNOW IF YOU COULD CALL HER IN [facility] FOR THE SICK TO STOMACH.PT HAS AN APPT NEXT WK WITH DR

## 2014-12-24 NOTE — Telephone Encounter (Signed)
Pt informed

## 2014-12-24 NOTE — Telephone Encounter (Signed)
ONDESTERONE 4 MG Q 8 HOURS PRN NAUSEAU, #30/0

## 2014-12-25 ENCOUNTER — Emergency Department: Payer: 59

## 2014-12-25 DIAGNOSIS — I10 Essential (primary) hypertension: Secondary | ICD-10-CM | POA: Diagnosis not present

## 2014-12-25 DIAGNOSIS — Z7982 Long term (current) use of aspirin: Secondary | ICD-10-CM | POA: Insufficient documentation

## 2014-12-25 DIAGNOSIS — Z791 Long term (current) use of non-steroidal anti-inflammatories (NSAID): Secondary | ICD-10-CM | POA: Insufficient documentation

## 2014-12-25 DIAGNOSIS — Z91040 Latex allergy status: Secondary | ICD-10-CM | POA: Insufficient documentation

## 2014-12-25 DIAGNOSIS — K209 Esophagitis, unspecified: Secondary | ICD-10-CM | POA: Diagnosis not present

## 2014-12-25 DIAGNOSIS — E119 Type 2 diabetes mellitus without complications: Secondary | ICD-10-CM | POA: Diagnosis not present

## 2014-12-25 DIAGNOSIS — Z79899 Other long term (current) drug therapy: Secondary | ICD-10-CM | POA: Insufficient documentation

## 2014-12-25 DIAGNOSIS — R1310 Dysphagia, unspecified: Secondary | ICD-10-CM | POA: Insufficient documentation

## 2014-12-25 LAB — TSH: TSH: 0.647 u[IU]/mL (ref 0.450–4.500)

## 2014-12-25 LAB — FOLATE: Folate: 19.5 ng/mL (ref >3.0)

## 2014-12-25 LAB — VITAMIN B12: VITAMIN B 12: 380 pg/mL (ref 211–946)

## 2014-12-25 NOTE — ED Notes (Signed)
Pt to ED c/o foreign body in esophogus that prevents food from being swallowed completely. Pt is speaking in complete sentences and no difficulty breathing.

## 2014-12-26 ENCOUNTER — Emergency Department
Admission: EM | Admit: 2014-12-26 | Discharge: 2014-12-26 | Disposition: A | Discharge status: Home / Self Care | Payer: 59 | Attending: Emergency Medicine | Admitting: Emergency Medicine

## 2014-12-26 ENCOUNTER — Encounter: Payer: Self-pay | Admitting: Radiology

## 2014-12-26 ENCOUNTER — Emergency Department: Payer: 59

## 2014-12-26 DIAGNOSIS — K209 Esophagitis, unspecified without bleeding: Secondary | ICD-10-CM

## 2014-12-26 DIAGNOSIS — R1310 Dysphagia, unspecified: Secondary | ICD-10-CM

## 2014-12-26 HISTORY — DX: Unspecified asthma, uncomplicated: J45.909

## 2014-12-26 LAB — BASIC METABOLIC PANEL
ANION GAP: 12 (ref 5–15)
BUN: 11 mg/dL (ref 6–20)
CALCIUM: 9.2 mg/dL (ref 8.9–10.3)
CHLORIDE: 100 mmol/L — AB (ref 101–111)
CO2: 24 mmol/L (ref 22–32)
Creatinine, Ser: 0.48 mg/dL (ref 0.44–1.00)
GFR calc Af Amer: >60 mL/min (ref >60)
GFR calc non Af Amer: >60 mL/min (ref >60)
Glucose, Bld: 185 mg/dL — ABNORMAL HIGH (ref 65–99)
POTASSIUM: 3.8 mmol/L (ref 3.5–5.1)
Sodium: 136 mmol/L (ref 135–145)

## 2014-12-26 LAB — CBC
HEMATOCRIT: 39.8 % (ref 35.0–47.0)
HEMOGLOBIN: 13.4 g/dL (ref 12.0–16.0)
MCH: 28.1 pg (ref 26.0–34.0)
MCHC: 33.8 g/dL (ref 32.0–36.0)
MCV: 83.1 fL (ref 80.0–100.0)
PLATELETS: 244 10*3/uL (ref 150–440)
RBC: 4.79 MIL/uL (ref 3.80–5.20)
RDW: 13.6 % (ref 11.5–14.5)
WBC: 9.5 10*3/uL (ref 3.6–11.0)

## 2014-12-26 MED ORDER — IOHEXOL 300 MG/ML  SOLN
75.0000 mL | Freq: Once | INTRAMUSCULAR | Status: AC | PRN
  Administered 2014-12-26: 75 mL via INTRAVENOUS

## 2014-12-26 MED ORDER — DEXAMETHASONE 1 MG/ML PO CONC: 10.0000 mg | Freq: Once | ORAL | Status: DC

## 2014-12-26 MED ORDER — DEXAMETHASONE 1 MG/ML PO CONC
ORAL | Status: AC
  Filled 2014-12-26: qty 1

## 2014-12-26 MED ORDER — DIPHENHYDRAMINE HCL 12.5 MG/5ML PO ELIX
25.0000 mg | ORAL_SOLUTION | Freq: Once | ORAL | Status: AC
  Administered 2014-12-26: 25 mg via ORAL
  Filled 2014-12-26: qty 10

## 2014-12-26 MED ORDER — SUCRALFATE 1 G PO TABS: 1.0000 g | ORAL_TABLET | Freq: Two times a day (BID) | ORAL | Status: DC

## 2014-12-26 NOTE — ED Provider Notes (Signed)
[facility] Emergency Department Provider Note ____________________________________________  Time seen: Approximately 333 AM  I have reviewed the triage vital signs and the nursing notes.   HISTORY  Chief Complaint Foreign Body    HPI Olivia Romero is a 59 y.o. female who comes in with difficulty swallowing today. The patient reports that the symptoms started earlier when she was eating pizza. She reports that the difficulty started before she swallowed. She reports that she was able to take her great imminent swallow water right afterwards. She reports that she then went to work and was able to keep calm for a short while but was getting herself worked up. She reports that she felt again like she couldn't swallow her saliva. She reports that she has felt a little tender in her throat but reports that it is a 1 out of 10 in intensity. She feels as though her throat is thick and swollen. The patient reports that she does not remember what she ate yesterday. She has been told that she has had esophageal spasm and gastroparesis in the past.     Past Medical History  Diagnosis Date  . Hemorrhoid     internal  . Neuropathy   . Diabetes mellitus without complication   . Hyperlipemia   . Stroke-like episode 2009    TIA  . Diverticulosis of colon   . Esophagitis   . Colon polyps     adenomatous  . Hypertension   . Carpal tunnel syndrome on right   . Asthma     Patient Active Problem List   Diagnosis Date Noted  . Sinus tachycardia 12/03/2014  . Bilateral carpal tunnel syndrome 12/01/2014  . DM neuropathy, type II diabetes mellitus 12/01/2014  . Nausea 12/01/2014  . Benign neoplasm of colon 11/30/2012  . Ischemic colitis 11/30/2012  . Diverticulosis of colon (without mention of hemorrhage) 11/30/2012  . Bright red blood per rectum 11/28/2012    Class: Acute  . Diabetes mellitus 11/28/2012  . Essential hypertension, benign 11/28/2012  . Malnutrition  of moderate degree 11/28/2012    Past Surgical History  Procedure Laterality Date  . Tonsillectomy    . Abdominal hysterectomy    . Colonoscopy N/A 11/30/2012    Procedure: COLONOSCOPY;  Surgeon: Irene Shipper, MD;  Location: [facility];  Service: Endoscopy;  Laterality: N/A;  . Cesarean section  1986    Current Outpatient Rx  Name  Route  Sig  Dispense  Refill  . acetaminophen (TYLENOL) 325 MG tablet   Oral   Take 2 tablets (650 mg total) by mouth every 6 (six) hours as needed for pain or fever (Mild pain).         [name] albuterol (PROVENTIL HFA;VENTOLIN HFA) 108 (90 BASE) MCG/ACT inhaler   Inhalation   Inhale 2 puffs into the lungs every 6 (six) hours as needed for wheezing or shortness of breath.         . ALPRAZolam (XANAX) 0.5 MG tablet   Oral   Take 0.5 mg by mouth at bedtime as needed for anxiety.         [name] aspirin 325 MG tablet   Oral   Take 325 mg by mouth daily.         [name] gabapentin (NEURONTIN) 300 MG capsule   Oral   Take 1 capsule (300 mg total) by mouth 3 (three) times daily.   270 capsule   1   . glimepiride (AMARYL) 2 MG tablet  Oral   Take 2 mg by mouth daily with breakfast.         . HYDROcodone-acetaminophen (NORCO/VICODIN) 5-325 MG per tablet   Oral   Take 1 tablet by mouth every 8 (eight) hours as needed.   15 tablet   0   . Icosapent Ethyl (VASCEPA) 1 G CAPS   Oral   Take 1,000 mg by mouth 3 (three) times daily.         [name] lisinopril (PRINIVIL,ZESTRIL) 10 MG tablet   Oral   Take 10 mg by mouth daily.         . meloxicam (MOBIC) 15 MG tablet   Oral   Take 15 mg by mouth daily.         . metFORMIN (GLUCOPHAGE) 500 MG tablet   Oral   Take 1,000 mg by mouth 2 (two) times daily with a meal.          . metoprolol succinate (TOPROL-XL) 25 MG 24 hr tablet   Oral   Take 25 mg by mouth daily.         . Multiple Vitamin (MULTIVITAMIN WITH MINERALS) TABS   Oral   Take 1 tablet by mouth daily.         [name] omega-3 acid ethyl  esters (LOVAZA) 1 G capsule   Oral   Take 2 g by mouth 2 (two) times daily.         . ondansetron (ZOFRAN) 4 MG tablet   Oral   Take 1 tablet (4 mg total) by mouth every 8 (eight) hours as needed for nausea or vomiting.   20 tablet   0   . sucralfate (CARAFATE) 1 G tablet   Oral   Take 1 tablet (1 g total) by mouth 2 (two) times daily.   30 tablet   0     Allergies Atorvastatin; Eggs or egg-derived products; Latex; and Pravastatin  Family History  Problem Relation Age of Onset  . Lung cancer Father   . Hypertension Father   . Arthritis Father   . Other Mother     hardening of the arteries/renal cell carcenoma  . Hypertension Mother   . Stroke Mother   . Arthritis Brother   . Rheum arthritis Maternal Uncle     Social History History  Substance Use Topics  . Smoking status: Never Smoker   . Smokeless tobacco: Never Used  . Alcohol Use: 0.0 oz/week    0 Standard drinks or equivalent per week     Comment: wine every 6-8 months    Review of Systems Constitutional: No fever/chills Eyes: No visual changes. ENT: No sore throat. Cardiovascular: Denies chest pain. Respiratory: Denies shortness of breath. Gastrointestinal: No abdominal pain.  No nausea, no vomiting.  No diarrhea.  No constipation. Genitourinary: Negative for dysuria. Musculoskeletal: Negative for back pain. Skin: Negative for rash. Neurological: Negative for headaches, focal weakness or numbness.  10-point ROS otherwise negative.  ____________________________________________   PHYSICAL EXAM:  VITAL SIGNS: ED Triage Vitals  Enc Vitals Group     BP 12/25/14 2208 176/86 mmHg     Pulse Rate 12/25/14 2208 96     Resp 12/25/14 2208 20     Temp 12/25/14 2208 98.3 F (36.8 C)     Temp Source 12/25/14 2208 Oral     SpO2 12/25/14 2208 96 %     Weight 12/25/14 2208 199 lb (90.266 kg)     Height 12/25/14 2208 5' (1.524 m)  Head Cir --      Peak Flow --      Pain Score 12/25/14 2210 0      Pain Loc --      Pain Edu? --      Excl. in [place]? --     Constitutional: Alert and oriented. Well appearing and in mild distress. Eyes: Conjunctivae are normal. PERRL. EOMI. Head: Atraumatic. Nose: No congestion/rhinnorhea. Mouth/Throat: Mucous membranes are moist.  Oropharynx non-erythematous. Cardiovascular: Normal rate, regular rhythm. Grossly normal heart sounds.  Good peripheral circulation. Respiratory: Normal respiratory effort.  No retractions. Lungs CTAB. Gastrointestinal: Soft and nontender. No distention. Positive bowel sounds Genitourinary: deferred Musculoskeletal: No lower extremity tenderness nor edema.   Neurologic:  Normal speech and language. No gross focal neurologic deficits are appreciated. Skin:  Skin is warm, dry and intact. No rash noted. Psychiatric: Mood and affect are normal.   ____________________________________________   LABS (all labs ordered are listed, but only abnormal results are displayed)  Labs Reviewed  BASIC METABOLIC PANEL - Abnormal; Notable for the following:    Chloride 100 (*)    Glucose, Bld 185 (*)    All other components within normal limits  CBC   ____________________________________________  EKG  none ____________________________________________  RADIOLOGY  CT soft tissue neck: Suggestion of mild wall thickening along the proximal to mid esophagus which could reflect mild esophagitis given the patient's symptoms. No FB seen within the esophagus or hypopharynx  DG Soft tissue: Unremarkable radiograph of the soft tissues of the neck.  ____________________________________________   PROCEDURES  Procedure(s) performed: None  Critical Care performed: No  ____________________________________________   INITIAL IMPRESSION / ASSESSMENT AND PLAN / ED COURSE  Pertinent labs & imaging results that were available during my care of the patient were reviewed by me and considered in my medical decision making (see chart for  details).  The patient on CT appears to have some esophagitis which may be the cause of her symptoms. The patient was given some benadryl and decadron to help with the other symptoms. I will discharge the patient to follow up with her GI physician. The patient has had no other symptoms while in the ED.  ____________________________________________   FINAL CLINICAL IMPRESSION(S) / ED DIAGNOSES  Final diagnoses:  Esophagitis  Difficulty swallowing      Loney Hering, MD 12/26/14 [phone]

## 2014-12-26 NOTE — ED Notes (Signed)
MD at bedside. 

## 2014-12-26 NOTE — Discharge Instructions (Signed)
Esophagitis Esophagitis is inflammation of the esophagus. It can involve swelling, soreness, and pain in the esophagus. This condition can make it difficult and painful to swallow. CAUSES  Most causes of esophagitis are not serious. Many different factors can cause esophagitis, including:  Gastroesophageal reflux disease (GERD). This is when acid from your stomach flows up into the esophagus.  Recurrent vomiting.  An allergic-type reaction.  Certain medicines, especially those that come in large pills.  Ingestion of harmful chemicals, such as household cleaning products.  Heavy alcohol use.  An infection of the esophagus.  Radiation treatment for cancer.  Certain diseases such as sarcoidosis, Crohn's disease, and scleroderma. These diseases may cause recurrent esophagitis. SYMPTOMS   Trouble swallowing.  Painful swallowing.  Chest pain.  Difficulty breathing.  Nausea.  Vomiting.  Abdominal pain. DIAGNOSIS  Your caregiver will take your history and do a physical exam. Depending upon what your caregiver finds, certain tests may also be done, including:  Barium X-ray. You will drink a solution that coats the esophagus, and X-rays will be taken.  Endoscopy. A lighted tube is put down the esophagus so your caregiver can examine the area.  Allergy tests. These can sometimes be arranged through follow-up visits. TREATMENT  Treatment will depend on the cause of your esophagitis. In some cases, steroids or other medicines may be given to help relieve your symptoms or to treat the underlying cause of your condition. Medicines that may be recommended include:  Viscous lidocaine, to soothe the esophagus.  Antacids.  Acid reducers.  Proton pump inhibitors.  Antiviral medicines for certain viral infections of the esophagus.  Antifungal medicines for certain fungal infections of the esophagus.  Antibiotic medicines, depending on the cause of the esophagitis. HOME CARE  INSTRUCTIONS   Avoid foods and drinks that seem to make your symptoms worse.  Eat small, frequent meals instead of large meals.  Avoid eating for the 3 hours prior to your bedtime.  If you have trouble taking pills, use a pill splitter to decrease the size and likelihood of the pill getting stuck or injuring the esophagus on the way down. Drinking water after taking a pill also helps.  Stop smoking if you smoke.  Maintain a healthy weight.  Wear loose-fitting clothing. Do not wear anything tight around your waist that causes pressure on your stomach.  Raise the head of your bed 6 to 8 inches with wood blocks to help you sleep. Extra pillows will not help.  Only take over-the-counter or prescription medicines as directed by your caregiver. SEEK IMMEDIATE MEDICAL CARE IF:  You have severe chest pain that radiates into your arm, neck, or jaw.  You feel sweaty, dizzy, or lightheaded.  You have shortness of breath.  You vomit blood.  You have difficulty or pain with swallowing.  You have bloody or black, tarry stools.  You have a fever.  You have a burning sensation in the chest more than 3 times a week for more than 2 weeks.  You cannot swallow, drink, or eat.  You drool because you cannot swallow your saliva. MAKE SURE YOU:  Understand these instructions.  Will watch your condition.  Will get help right away if you are not doing well or get worse. Document Released: 06/29/2004 Document Revised: 08/14/2011 Document Reviewed: 01/20/2011 [facility] Patient Information 2015 [employer], [place]. This information is not intended to replace advice given to you by your health care provider. Make sure you discuss any questions you have with your health care provider.  Dysphagia Swallowing problems (dysphagia) occur when solids and liquids seem to stick in your throat on the way down to your stomach, or the food takes longer to get to the stomach. Other symptoms include regurgitating  food, noises coming from the throat, chest discomfort with swallowing, and a feeling of fullness or the feeling of something being stuck in your throat when swallowing. When blockage in your throat is complete, it may be associated with drooling. CAUSES  Problems with swallowing may occur because of problems with the muscles. The food cannot be propelled in the usual manner into your stomach. You may have ulcers, scar tissue, or inflammation in the tube down which food travels from your mouth to your stomach (esophagus), which blocks food from passing normally into the stomach. Causes of inflammation include:  Acid reflux from your stomach into your esophagus.  Infection.  Radiation treatment for cancer.  Medicines taken without enough fluids to wash them down into your stomach. You may have nerve problems that prevent signals from being sent to the muscles of your esophagus to contract and move your food down to your stomach. Globus pharyngeus is a relatively common problem in which there is a sense of an obstruction or difficulty in swallowing, without any physical abnormalities of the swallowing passages being found. This problem usually improves over time with reassurance and testing to rule out other causes. DIAGNOSIS Dysphagia can be diagnosed and its cause can be determined by tests in which you swallow a white substance that helps illuminate the inside of your throat (contrast medium) while X-rays are taken. Sometimes a flexible telescope that is inserted down your throat (endoscopy) to look at your esophagus and stomach is used. TREATMENT   If the dysphagia is caused by acid reflux or infection, medicines may be used.  If the dysphagia is caused by problems with your swallowing muscles, swallowing therapy may be used to help you strengthen your swallowing muscles.  If the dysphagia is caused by a blockage or mass, procedures to remove the blockage may be done. HOME CARE  INSTRUCTIONS  Try to eat soft food that is easier to swallow and check your weight on a daily basis to be sure that it is not decreasing.  Be sure to drink liquids when sitting upright (not lying down). SEEK MEDICAL CARE IF:  You are losing weight because you are unable to swallow.  You are coughing when you drink liquids (aspiration).  You are coughing up partially digested food. SEEK IMMEDIATE MEDICAL CARE IF:  You are unable to swallow your own saliva .  You are having shortness of breath or a fever, or both.  You have a hoarse voice along with difficulty swallowing. MAKE SURE YOU:  Understand these instructions.  Will watch your condition.  Will get help right away if you are not doing well or get worse. Document Released: 05/19/2000 Document Revised: 10/06/2013 Document Reviewed: 11/08/2012 [facility] Patient Information 2015 [place], [place]. This information is not intended to replace advice given to you by your health care provider. Make sure you discuss any questions you have with your health care provider.

## 2014-12-26 NOTE — ED Notes (Signed)
Patient transported to CT 

## 2014-12-31 ENCOUNTER — Ambulatory Visit (INDEPENDENT_AMBULATORY_CARE_PROVIDER_SITE_OTHER): Payer: 59 | Admitting: Family Medicine

## 2014-12-31 ENCOUNTER — Encounter: Payer: Self-pay | Admitting: Family Medicine

## 2014-12-31 VITALS — BP 132/82 | HR 90 | Temp 98.3°F | Resp 16 | Ht 60.0 in | Wt 201.4 lb

## 2014-12-31 DIAGNOSIS — R1310 Dysphagia, unspecified: Secondary | ICD-10-CM

## 2014-12-31 DIAGNOSIS — E1121 Type 2 diabetes mellitus with diabetic nephropathy: Secondary | ICD-10-CM | POA: Diagnosis not present

## 2014-12-31 DIAGNOSIS — Z794 Long term (current) use of insulin: Secondary | ICD-10-CM | POA: Insufficient documentation

## 2014-12-31 DIAGNOSIS — K209 Esophagitis, unspecified without bleeding: Secondary | ICD-10-CM

## 2014-12-31 DIAGNOSIS — E1140 Type 2 diabetes mellitus with diabetic neuropathy, unspecified: Secondary | ICD-10-CM | POA: Diagnosis not present

## 2014-12-31 DIAGNOSIS — K2100 Gastro-esophageal reflux disease with esophagitis, without bleeding: Secondary | ICD-10-CM | POA: Insufficient documentation

## 2014-12-31 HISTORY — DX: Dysphagia, unspecified: R13.10

## 2014-12-31 NOTE — Progress Notes (Signed)
Name: Olivia Romero   MRN: [id number]    DOB: Dec 05, 1955   Date:12/31/2014       Progress Note  Subjective  Chief Complaint  Chief Complaint  Patient presents with  . Diabetes    Pt here for 1 month follow up   . Hospitalization Follow-up    Pt had ER visit 12/25/14 for esophegeal spasms. Has appt with gastro for upper GI procedure on 01/14/15    Diabetes She presents for her follow-up diabetic visit. She has type 2 diabetes mellitus. Her disease course has been improving. Associated symptoms include foot paresthesias and visual change. Symptoms are improving. Diabetic complications include autonomic neuropathy, nephropathy and peripheral neuropathy. Risk factors for coronary artery disease include diabetes mellitus, dyslipidemia, hypertension, obesity, post-menopausal, sedentary lifestyle and stress. Current diabetic treatment includes oral agent (dual therapy). She is compliant with treatment some of the time. Her weight is increasing steadily. She is following a diabetic diet. She has had a previous visit with a dietitian. Her home blood glucose trend is decreasing steadily. Her overall blood glucose range is 140-180 mg/dl. An ACE inhibitor/angiotensin II receptor blocker is being taken. She does not see a podiatrist. Gastrophageal Reflux  Patient is experiencing reflux symptomatology as well as retrosternal burning and dysphagia with mainly solids but also liquids to some degree. She is seen in emergency department where CT scan showed changes consistent with S esophagitis as well as some esophageal narrowing. She is seeing gastroenterology mid-level and is awaiting EGD    Past Medical History  Diagnosis Date  . Hemorrhoid     internal  . Neuropathy   . Diabetes mellitus without complication   . Hyperlipemia   . Stroke-like episode 2009    TIA  . Diverticulosis of colon   . Esophagitis   . Colon polyps     adenomatous  . Hypertension   . Carpal tunnel syndrome on right   .  Asthma     History  Substance Use Topics  . Smoking status: Never Smoker   . Smokeless tobacco: Never Used  . Alcohol Use: 0.0 oz/week    0 Standard drinks or equivalent per week     Comment: wine every 6-8 months     Current outpatient prescriptions:  .  acetaminophen (TYLENOL) 325 MG tablet, Take 2 tablets (650 mg total) by mouth every 6 (six) hours as needed for pain or fever (Mild pain)., Disp: , Rfl:  .  albuterol (PROVENTIL HFA;VENTOLIN HFA) 108 (90 BASE) MCG/ACT inhaler, Inhale 2 puffs into the lungs every 6 (six) hours as needed for wheezing or shortness of breath., Disp: , Rfl:  .  ALPRAZolam (XANAX) 0.5 MG tablet, Take 0.5 mg by mouth at bedtime as needed for anxiety., Disp: , Rfl:  .  aspirin 325 MG tablet, Take 325 mg by mouth daily., Disp: , Rfl:  .  gabapentin (NEURONTIN) 300 MG capsule, Take 1 capsule (300 mg total) by mouth 3 (three) times daily., Disp: 270 capsule, Rfl: 1 .  glimepiride (AMARYL) 2 MG tablet, Take 2 mg by mouth daily with breakfast., Disp: , Rfl:  .  HYDROcodone-acetaminophen (NORCO/VICODIN) 5-325 MG per tablet, Take 1 tablet by mouth every 8 (eight) hours as needed., Disp: 15 tablet, Rfl: 0 .  Icosapent Ethyl (VASCEPA) 1 G CAPS, Take 1,000 mg by mouth 3 (three) times daily., Disp: , Rfl:  .  lisinopril (PRINIVIL,ZESTRIL) 10 MG tablet, Take 10 mg by mouth daily., Disp: , Rfl:  .  meloxicam (MOBIC) 15  MG tablet, Take 15 mg by mouth daily., Disp: , Rfl:  .  metFORMIN (GLUCOPHAGE) 500 MG tablet, Take 1,000 mg by mouth 2 (two) times daily with a meal. , Disp: , Rfl:  .  metoprolol succinate (TOPROL-XL) 25 MG 24 hr tablet, Take 25 mg by mouth daily., Disp: , Rfl:  .  Multiple Vitamin (MULTIVITAMIN WITH MINERALS) TABS, Take 1 tablet by mouth daily., Disp: , Rfl:  .  omega-3 acid ethyl esters (LOVAZA) 1 G capsule, Take 2 g by mouth 2 (two) times daily., Disp: , Rfl:  .  ondansetron (ZOFRAN) 4 MG tablet, Take 1 tablet (4 mg total) by mouth every 8 (eight) hours as  needed for nausea or vomiting., Disp: 20 tablet, Rfl: 0 .  sucralfate (CARAFATE) 1 G tablet, Take 1 tablet (1 g total) by mouth 2 (two) times daily., Disp: 30 tablet, Rfl: 0  Allergies  Allergen Reactions  . Atorvastatin     Unknown bothers liver  . Eggs Or Egg-Derived Products Other (See Comments)    Angioedema  . Latex Itching  . Pravastatin Itching and Rash    ROS   Objective  Filed Vitals:   12/31/14 0821  BP: 132/82  Pulse: 90  Temp: 98.3 F (36.8 C)  Resp: 16  Height: 5' (1.524 m)  Weight: 201 lb 6 oz (91.343 kg)  SpO2: 97%     Physical Exam    Assessment & Plan  1. Esophagitis Followed by gastroenterologist  2. Dysphagia Awaiting EGD  3. DM neuropathy, type II diabetes mellitus Continue attempted better control and gabapentin  4. Type 2 diabetes mellitus with diabetic nephropathy As per #3 We'll consider adding by [place] when she returns in 1 month for A1c remains elevated

## 2014-12-31 NOTE — Patient Instructions (Signed)

## 2015-01-11 ENCOUNTER — Other Ambulatory Visit: Payer: Self-pay

## 2015-01-11 DIAGNOSIS — N200 Calculus of kidney: Secondary | ICD-10-CM

## 2015-01-14 ENCOUNTER — Ambulatory Visit: Payer: 59 | Admitting: Anesthesiology

## 2015-01-14 ENCOUNTER — Telehealth: Payer: Self-pay | Admitting: Family Medicine

## 2015-01-14 ENCOUNTER — Encounter: Payer: Self-pay | Admitting: Anesthesiology

## 2015-01-14 ENCOUNTER — Encounter
Admission: RE | Disposition: A | Discharge status: Home / Self Care | Payer: Self-pay | Source: Ambulatory Visit | Attending: Unknown Physician Specialty

## 2015-01-14 ENCOUNTER — Ambulatory Visit
Admission: RE | Admit: 2015-01-14 | Discharge: 2015-01-14 | Disposition: A | Discharge status: Home / Self Care | Payer: 59 | Source: Ambulatory Visit | Attending: Unknown Physician Specialty | Admitting: Unknown Physician Specialty

## 2015-01-14 DIAGNOSIS — G4730 Sleep apnea, unspecified: Secondary | ICD-10-CM | POA: Diagnosis not present

## 2015-01-14 DIAGNOSIS — Z91012 Allergy to eggs: Secondary | ICD-10-CM | POA: Diagnosis not present

## 2015-01-14 DIAGNOSIS — Z86010 Personal history of colonic polyps: Secondary | ICD-10-CM | POA: Insufficient documentation

## 2015-01-14 DIAGNOSIS — Z8673 Personal history of transient ischemic attack (TIA), and cerebral infarction without residual deficits: Secondary | ICD-10-CM | POA: Insufficient documentation

## 2015-01-14 DIAGNOSIS — Z7982 Long term (current) use of aspirin: Secondary | ICD-10-CM | POA: Diagnosis not present

## 2015-01-14 DIAGNOSIS — Z9889 Other specified postprocedural states: Secondary | ICD-10-CM | POA: Insufficient documentation

## 2015-01-14 DIAGNOSIS — Z823 Family history of stroke: Secondary | ICD-10-CM | POA: Insufficient documentation

## 2015-01-14 DIAGNOSIS — Z79899 Other long term (current) drug therapy: Secondary | ICD-10-CM | POA: Insufficient documentation

## 2015-01-14 DIAGNOSIS — K222 Esophageal obstruction: Secondary | ICD-10-CM | POA: Insufficient documentation

## 2015-01-14 DIAGNOSIS — Z888 Allergy status to other drugs, medicaments and biological substances status: Secondary | ICD-10-CM | POA: Insufficient documentation

## 2015-01-14 DIAGNOSIS — E785 Hyperlipidemia, unspecified: Secondary | ICD-10-CM | POA: Insufficient documentation

## 2015-01-14 DIAGNOSIS — Z8051 Family history of malignant neoplasm of kidney: Secondary | ICD-10-CM | POA: Insufficient documentation

## 2015-01-14 DIAGNOSIS — I10 Essential (primary) hypertension: Secondary | ICD-10-CM | POA: Insufficient documentation

## 2015-01-14 DIAGNOSIS — Z8379 Family history of other diseases of the digestive system: Secondary | ICD-10-CM | POA: Diagnosis not present

## 2015-01-14 DIAGNOSIS — Z801 Family history of malignant neoplasm of trachea, bronchus and lung: Secondary | ICD-10-CM | POA: Insufficient documentation

## 2015-01-14 DIAGNOSIS — K219 Gastro-esophageal reflux disease without esophagitis: Secondary | ICD-10-CM | POA: Insufficient documentation

## 2015-01-14 DIAGNOSIS — Z91040 Latex allergy status: Secondary | ICD-10-CM | POA: Diagnosis not present

## 2015-01-14 DIAGNOSIS — K2970 Gastritis, unspecified, without bleeding: Secondary | ICD-10-CM | POA: Insufficient documentation

## 2015-01-14 DIAGNOSIS — R1310 Dysphagia, unspecified: Secondary | ICD-10-CM | POA: Insufficient documentation

## 2015-01-14 DIAGNOSIS — Z791 Long term (current) use of non-steroidal anti-inflammatories (NSAID): Secondary | ICD-10-CM | POA: Diagnosis not present

## 2015-01-14 DIAGNOSIS — J45909 Unspecified asthma, uncomplicated: Secondary | ICD-10-CM | POA: Diagnosis not present

## 2015-01-14 DIAGNOSIS — E119 Type 2 diabetes mellitus without complications: Secondary | ICD-10-CM | POA: Insufficient documentation

## 2015-01-14 HISTORY — PX: ESOPHAGOGASTRODUODENOSCOPY (EGD) WITH PROPOFOL: SHX5813

## 2015-01-14 HISTORY — PX: SAVORY DILATION: SHX5439

## 2015-01-14 LAB — GLUCOSE, CAPILLARY: Glucose-Capillary: 148 mg/dL — ABNORMAL HIGH (ref 65–99)

## 2015-01-14 SURGERY — ESOPHAGOGASTRODUODENOSCOPY (EGD) WITH PROPOFOL
Anesthesia: General

## 2015-01-14 MED ORDER — SODIUM CHLORIDE 0.9 % IV SOLN
INTRAVENOUS | Status: DC
Start: 2015-01-14 — End: 2015-01-14
  Administered 2015-01-14: 1000 mL via INTRAVENOUS

## 2015-01-14 MED ORDER — ETOMIDATE 2 MG/ML IV SOLN
INTRAVENOUS | Status: DC | PRN
  Administered 2015-01-14: 6 mg via INTRAVENOUS
  Administered 2015-01-14 (×3): 4 mg via INTRAVENOUS
  Administered 2015-01-14: 2 mg via INTRAVENOUS

## 2015-01-14 MED ORDER — ONDANSETRON HCL 4 MG/2ML IJ SOLN
4.0000 mg | Freq: Once | INTRAMUSCULAR | Status: AC
  Administered 2015-01-14: 4 mg via INTRAVENOUS

## 2015-01-14 MED ORDER — GLYCOPYRROLATE 0.2 MG/ML IJ SOLN
INTRAMUSCULAR | Status: DC | PRN
  Administered 2015-01-14: 0.1 mg via INTRAVENOUS

## 2015-01-14 MED ORDER — LIDOCAINE HCL (CARDIAC) 20 MG/ML IV SOLN
INTRAVENOUS | Status: DC | PRN
  Administered 2015-01-14: 40 mg via INTRAVENOUS

## 2015-01-14 MED ORDER — METOPROLOL SUCCINATE ER 25 MG PO TB24
25.0000 mg | ORAL_TABLET | Freq: Every day | ORAL | Status: DC
  Administered 2015-01-14: 25 mg via ORAL
  Filled 2015-01-14: qty 1

## 2015-01-14 MED ORDER — FENTANYL CITRATE (PF) 100 MCG/2ML IJ SOLN
INTRAMUSCULAR | Status: DC | PRN
  Administered 2015-01-14: 50 ug via INTRAVENOUS

## 2015-01-14 MED ORDER — MIDAZOLAM HCL 2 MG/2ML IJ SOLN
INTRAMUSCULAR | Status: DC | PRN
  Administered 2015-01-14: 1 mg via INTRAVENOUS

## 2015-01-14 MED ORDER — KETAMINE HCL 50 MG/ML IJ SOLN
INTRAMUSCULAR | Status: DC | PRN
  Administered 2015-01-14 (×2): 50 mg via INTRAMUSCULAR

## 2015-01-14 MED ORDER — SODIUM CHLORIDE 0.9 % IV SOLN: INTRAVENOUS | Status: DC

## 2015-01-14 NOTE — Op Note (Signed)
[facility] Patient Name: Olivia Romero Procedure Date: 01/14/2015 8:11 AM MRN: [id number] Account #: [id number] Date of Birth: [birth year] Admit Type: Outpatient Age: 59 Room: [facility] ENDO ROOM 1 Gender: Female Note Status: Finalized Procedure:         Upper GI endoscopy Indications:       Dysphagia Providers:         Manya Silvas, MD Referring MD:      Ashok Norris, MD (Referring MD) Medicines:         Monitored Anesthesia Care Complications:     No immediate complications. Procedure:         Pre-Anesthesia Assessment:                    - After reviewing the risks and benefits, the patient was                     deemed in satisfactory condition to undergo the procedure.                    After obtaining informed consent, the endoscope was passed                     under direct vision. Throughout the procedure, the                     patient's blood pressure, pulse, and oxygen saturations                     were monitored continuously. The Endoscope was introduced                     through the mouth, and advanced to the second part of                     duodenum. The upper GI endoscopy was accomplished without                     difficulty. The patient tolerated the procedure well. Findings:      A mild Schatzki ring (acquired) was found at the gastroesophageal       junction. GEJ 38cm. At the end of the procedure A guidewire was placed       and the scope was withdrawn. Dilation was performed with a Savary       dilator with mild resistance at 15 mm and 16 mm.      The examined esophagus was otherwise normal.      Diffuse mild inflammation characterized by granularity was found in the       gastric body. Biopsies were taken with a cold forceps for histology.       Biopsies were taken with a cold forceps for Helicobacter pylori testing.      Patchy mildly erythematous mucosa without bleeding was found in the       gastric  antrum. Prominent enlarged gastric folds seen and biopsied.       Biopsies were taken with a cold forceps for Helicobacter pylori testing.       Biopsies were taken with a cold forceps for histology.      Localized mildly erythematous mucosa without active bleeding and with no       stigmata of bleeding was found in the duodenal bulb.      The examined duodenum second portion was normal. Impression:        -  Mild Schatzki ring. Dilated.                    - Normal esophagus.                    - Gastritis. Biopsied.                    - Erythematous mucosa in the antrum. Biopsied.                    - Erythematous duodenopathy.                    - Normal examined duodenum. Recommendation:    - Await pathology results.                    - soft food for 3 days, eat slowly, chew well, take small                     bites Manya Silvas, MD 01/14/2015 8:31:02 AM This report has been signed electronically. Number of Addenda: 0 Note Initiated On: 01/14/2015 8:11 AM      [facility]

## 2015-01-14 NOTE — Transfer of Care (Signed)
Immediate Anesthesia Transfer of Care Note  Patient: Olivia Romero  Procedure(s) Performed: Procedure(s): ESOPHAGOGASTRODUODENOSCOPY (EGD) WITH PROPOFOL (N/A) SAVORY DILATION (N/A)  Patient Location: PACU  Anesthesia Type:General  Level of Consciousness: awake, alert  and sedated  Airway & Oxygen Therapy: Patient Spontanous Breathing and Patient connected to nasal cannula oxygen  Post-op Assessment: Report given to RN and Post -op Vital signs reviewed and stable  Post vital signs: Reviewed and stable  Last Vitals:  Filed Vitals:   01/14/15 0830  BP: 160/67  Pulse: 88  Temp: 36.8 C  Resp: 18    Complications: No apparent anesthesia complications

## 2015-01-14 NOTE — Telephone Encounter (Signed)
Spoke with Lorriane Shire from [employer] and clarified missing information from [employer] paperwork. Lorriane Shire stated that she had all that she needed and that Ms.Tilly would be approved.

## 2015-01-14 NOTE — Anesthesia Postprocedure Evaluation (Signed)
  Anesthesia Post-op Note  Patient: Olivia Romero  Procedure(s) Performed: Procedure(s): ESOPHAGOGASTRODUODENOSCOPY (EGD) WITH PROPOFOL (N/A) SAVORY DILATION (N/A)  Anesthesia type:General  Patient location: PACU  Post pain: Pain level controlled  Post assessment: Post-op Vital signs reviewed, Patient's Cardiovascular Status Stable, Respiratory Function Stable, Patent Airway and No signs of Nausea or vomiting  Post vital signs: Reviewed and stable  Last Vitals:  Filed Vitals:   01/14/15 0909  BP: 144/66  Pulse: 75  Temp:   Resp: 17    Level of consciousness: awake, alert  and patient cooperative  Complications: No apparent anesthesia complications

## 2015-01-14 NOTE — Anesthesia Procedure Notes (Signed)
Performed by: COOK-MARTIN, Rhaya Coale Pre-anesthesia Checklist: Patient identified, Emergency Drugs available, Suction available, Patient being monitored and Timeout performed Patient Re-evaluated:Patient Re-evaluated prior to inductionOxygen Delivery Method: Nasal cannula Preoxygenation: Pre-oxygenation with 100% oxygen Intubation Type: IV induction Airway Equipment and Method: Bite block Placement Confirmation: positive ETCO2 and CO2 detector     

## 2015-01-14 NOTE — Telephone Encounter (Signed)
Olivia Romero from [employer] is requesting a return call concerning FMLA paperwork.

## 2015-01-14 NOTE — Anesthesia Preprocedure Evaluation (Signed)
Anesthesia Evaluation  Patient identified by MRN, date of birth, ID band Patient awake    Reviewed: Allergy & Precautions, NPO status , Patient's Chart, lab work & pertinent test results, reviewed documented beta blocker date and time   Airway Mallampati: III  TM Distance: >3 FB     Dental  (+) Chipped   Pulmonary asthma ,          Cardiovascular hypertension, Pt. on medications and Pt. on home beta blockers     Neuro/Psych  Neuromuscular disease CVA    GI/Hepatic GERD-  ,  Endo/Other  diabetes, Type 2  Renal/GU Renal InsufficiencyRenal disease     Musculoskeletal   Abdominal   Peds  Hematology   Anesthesia Other Findings Will use etomidate.  Reproductive/Obstetrics                             Anesthesia Physical Anesthesia Plan  ASA: III  Anesthesia Plan: General   Post-op Pain Management:    Induction: Intravenous  Airway Management Planned: Nasal Cannula  Additional Equipment:   Intra-op Plan:   Post-operative Plan:   Informed Consent: I have reviewed the patients History and Physical, chart, labs and discussed the procedure including the risks, benefits and alternatives for the proposed anesthesia with the patient or authorized representative who has indicated his/her understanding and acceptance.   Dental advisory given  Plan Discussed with: CRNA  Anesthesia Plan Comments:         Anesthesia Quick Evaluation

## 2015-01-14 NOTE — H&P (Signed)
Primary Care Physician:  Ashok Norris, MD Primary Gastroenterologist:  Dr. Vira Agar  Pre-Procedure History & Physical: HPI:  Olivia Romero is a 59 y.o. female is here for an endoscopy.   Past Medical History  Diagnosis Date  . Hemorrhoid     internal  . Neuropathy   . Diabetes mellitus without complication   . Hyperlipemia   . Stroke-like episode 2009    TIA  . Diverticulosis of colon   . Esophagitis   . Colon polyps     adenomatous  . Hypertension   . Carpal tunnel syndrome on right   . Asthma     Past Surgical History  Procedure Laterality Date  . Tonsillectomy    . Abdominal hysterectomy    . Colonoscopy N/A 11/30/2012    Procedure: COLONOSCOPY;  Surgeon: Irene Shipper, MD;  Location: [facility];  Service: Endoscopy;  Laterality: N/A;  . Cesarean section  1986    Prior to Admission medications   Medication Sig Start Date End Date Taking? Authorizing Provider  acetaminophen (TYLENOL) 325 MG tablet Take 2 tablets (650 mg total) by mouth every 6 (six) hours as needed for pain or fever (Mild pain). 11/30/12   Modena Jansky, MD  albuterol (PROVENTIL HFA;VENTOLIN HFA) 108 (90 BASE) MCG/ACT inhaler Inhale 2 puffs into the lungs every 6 (six) hours as needed for wheezing or shortness of breath.    Historical Provider, MD  ALPRAZolam Duanne Moron) 0.5 MG tablet Take 0.5 mg by mouth at bedtime as needed for anxiety.    Historical Provider, MD  aspirin 325 MG tablet Take 325 mg by mouth daily.    Historical Provider, MD  gabapentin (NEURONTIN) 300 MG capsule Take 1 capsule (300 mg total) by mouth 3 (three) times daily. 12/11/14   Ashok Norris, MD  glimepiride (AMARYL) 2 MG tablet Take 2 mg by mouth daily with breakfast.    Historical Provider, MD  HYDROcodone-acetaminophen (NORCO/VICODIN) 5-325 MG per tablet Take 1 tablet by mouth every 8 (eight) hours as needed. 11/09/14   Ashok Norris, MD  Icosapent Ethyl (VASCEPA) 1 G CAPS Take 1,000 mg by mouth 3 (three) times daily.     Historical Provider, MD  lisinopril (PRINIVIL,ZESTRIL) 10 MG tablet Take 10 mg by mouth daily.    Historical Provider, MD  meloxicam (MOBIC) 15 MG tablet Take 15 mg by mouth daily.    Historical Provider, MD  metFORMIN (GLUCOPHAGE) 500 MG tablet Take 1,000 mg by mouth 2 (two) times daily with a meal.     Historical Provider, MD  metoprolol succinate (TOPROL-XL) 25 MG 24 hr tablet Take 25 mg by mouth daily.    Historical Provider, MD  Multiple Vitamin (MULTIVITAMIN WITH MINERALS) TABS Take 1 tablet by mouth daily.    Historical Provider, MD  omega-3 acid ethyl esters (LOVAZA) 1 G capsule Take 2 g by mouth 2 (two) times daily.    Historical Provider, MD  ondansetron (ZOFRAN) 4 MG tablet Take 1 tablet (4 mg total) by mouth every 8 (eight) hours as needed for nausea or vomiting. 12/01/14   Ashok Norris, MD  sucralfate (CARAFATE) 1 G tablet Take 1 tablet (1 g total) by mouth 2 (two) times daily. 12/26/14 12/26/15  Loney Hering, MD    Allergies as of 12/29/2014 - Review Complete 12/26/2014  Allergen Reaction Noted  . Atorvastatin  11/27/2012  . Eggs or egg-derived products Other (See Comments) 12/26/2014  . Latex Itching 11/27/2012  . Pravastatin Itching and Rash 11/27/2012  Family History  Problem Relation Age of Onset  . Lung cancer Father   . Hypertension Father   . Arthritis Father   . Other Mother     hardening of the arteries/renal cell carcenoma  . Hypertension Mother   . Stroke Mother   . Arthritis Brother   . Rheum arthritis Maternal Uncle     Social History   Social History  . Marital Status: Single    Spouse Name: N/A  . Number of Children: 1  . 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 Smoking status: Never Smoker   . Smokeless tobacco: Never Used  . Alcohol Use: 0.0 oz/week    0 Standard drinks or equivalent per week     Comment: wine every 6-8 months  . Drug Use: No  . Sexual Activity: No    Other Topics Concern  . Not on file   Social History Narrative    Review of Systems: See HPI, otherwise negative ROS  Physical Exam: BP 136/71 mmHg  Pulse 78  Temp(Src) 98.5 F (36.9 C) (Tympanic)  Resp 16  Ht 5' (1.524 m)  Wt 91.173 kg (201 lb)  BMI 39.26 kg/m2  SpO2 100% General:   Alert,  pleasant and cooperative in NAD Head:  Normocephalic and atraumatic. Neck:  Supple; no masses or thyromegaly. Lungs:  Clear throughout to auscultation.    Heart:  Regular rate and rhythm. Abdomen:  Soft, nontender and nondistended. Normal bowel sounds, without guarding, and without rebound.   Neurologic:  Alert and  oriented x4;  grossly normal neurologically.  Impression/Plan: Olivia Romero is here for an endoscopy to be performed for dysphagia  Risks, benefits, limitations, and alternatives regarding  endoscopy have been reviewed with the patient.  Questions have been answered.  All parties agreeable.   Gaylyn Cheers, MD  01/14/2015, 8:10 AM    Primary Care Physician:  Ashok Norris, MD Primary Gastroenterologist:  Dr. Vira Agar  Pre-Procedure History & Physical: HPI:  Olivia Romero is a 59 y.o. female is here for an endoscopy.   Past Medical History  Diagnosis Date  . Hemorrhoid     internal  . Neuropathy   . Diabetes mellitus without complication   . Hyperlipemia   . Stroke-like episode 2009    TIA  . Diverticulosis of colon   . Esophagitis   . Colon polyps     adenomatous  . Hypertension   . Carpal tunnel syndrome on right   . Asthma     Past Surgical History  Procedure Laterality Date  . Tonsillectomy    . Abdominal hysterectomy    . Colonoscopy N/A 11/30/2012    Procedure: COLONOSCOPY;  Surgeon: Irene Shipper, MD;  Location: [facility];  Service: Endoscopy;  Laterality: N/A;  . Cesarean section  1986    Prior to Admission medications   Medication Sig Start Date End Date Taking? Authorizing Provider  acetaminophen (TYLENOL) 325 MG tablet  Take 2 tablets (650 mg total) by mouth every 6 (six) hours as needed for pain or fever (Mild pain). 11/30/12   Modena Jansky, MD  albuterol (PROVENTIL HFA;VENTOLIN HFA) 108 (90 BASE) MCG/ACT inhaler Inhale 2 puffs into the lungs every 6 (six) hours as needed for wheezing or shortness of breath.    Historical Provider, MD  ALPRAZolam Duanne Moron) 0.5 MG tablet Take 0.5 mg by mouth at bedtime as needed for anxiety.  Historical Provider, MD  aspirin 325 MG tablet Take 325 mg by mouth daily.    Historical Provider, MD  gabapentin (NEURONTIN) 300 MG capsule Take 1 capsule (300 mg total) by mouth 3 (three) times daily. 12/11/14   Ashok Norris, MD  glimepiride (AMARYL) 2 MG tablet Take 2 mg by mouth daily with breakfast.    Historical Provider, MD  HYDROcodone-acetaminophen (NORCO/VICODIN) 5-325 MG per tablet Take 1 tablet by mouth every 8 (eight) hours as needed. 11/09/14   Ashok Norris, MD  Icosapent Ethyl (VASCEPA) 1 G CAPS Take 1,000 mg by mouth 3 (three) times daily.    Historical Provider, MD  lisinopril (PRINIVIL,ZESTRIL) 10 MG tablet Take 10 mg by mouth daily.    Historical Provider, MD  meloxicam (MOBIC) 15 MG tablet Take 15 mg by mouth daily.    Historical Provider, MD  metFORMIN (GLUCOPHAGE) 500 MG tablet Take 1,000 mg by mouth 2 (two) times daily with a meal.     Historical Provider, MD  metoprolol succinate (TOPROL-XL) 25 MG 24 hr tablet Take 25 mg by mouth daily.    Historical Provider, MD  Multiple Vitamin (MULTIVITAMIN WITH MINERALS) TABS Take 1 tablet by mouth daily.    Historical Provider, MD  omega-3 acid ethyl esters (LOVAZA) 1 G capsule Take 2 g by mouth 2 (two) times daily.    Historical Provider, MD  ondansetron (ZOFRAN) 4 MG tablet Take 1 tablet (4 mg total) by mouth every 8 (eight) hours as needed for nausea or vomiting. 12/01/14   Ashok Norris, MD  sucralfate (CARAFATE) 1 G tablet Take 1 tablet (1 g total) by mouth 2 (two) times daily. 12/26/14 12/26/15  Loney Hering, MD     Allergies as of 12/29/2014 - Review Complete 12/26/2014  Allergen Reaction Noted  . Atorvastatin  11/27/2012  . Eggs or egg-derived products Other (See Comments) 12/26/2014  . Latex Itching 11/27/2012  . Pravastatin Itching and Rash 11/27/2012    Family History  Problem Relation Age of Onset  . Lung cancer Father   . Hypertension Father   . Arthritis Father   . Other Mother     hardening of the arteries/renal cell carcenoma  . Hypertension Mother   . Stroke Mother   . Arthritis Brother   . Rheum arthritis Maternal Uncle     Social History   Social History  . Marital Status: Single    Spouse Name: N/A  . Number of Children: 1  . 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 Smoking status: Never Smoker   . Smokeless tobacco: Never Used  . Alcohol Use: 0.0 oz/week    0 Standard drinks or equivalent per week     Comment: wine every 6-8 months  . Drug Use: No  . Sexual Activity: No   Other Topics Concern  . Not on file   Social History Narrative    Review of Systems: See HPI, otherwise negative ROS  Physical Exam: BP 136/71 mmHg  Pulse 78  Temp(Src) 98.5 F (36.9 C) (Tympanic)  Resp 16  Ht 5' (1.524 m)  Wt 91.173 kg (201 lb)  BMI 39.26 kg/m2  SpO2 100% General:   Alert,  pleasant and cooperative in NAD Head:  Normocephalic and atraumatic. Neck:  Supple; no masses or thyromegaly.  Small incision scar left anterior neck. Lungs:  Clear throughout to auscultation.    Heart:  Regular rate and rhythm. Abdomen:  Soft, nontender and nondistended. Normal bowel sounds, without guarding, and without rebound.   Neurologic:  Alert and  oriented x4;  grossly normal neurologically.  Impression/Plan: Olivia Romero is here for an endoscopy to be performed for dysphagia, egg allergy discussed with CRNA  Risks, benefits, limitations, and alternatives regarding  endoscopy have been reviewed with the  patient.  Questions have been answered.  All parties agreeable.   Gaylyn Cheers, MD  01/14/2015, 8:10 AM

## 2015-01-15 ENCOUNTER — Encounter: Payer: Self-pay | Admitting: Unknown Physician Specialty

## 2015-01-18 LAB — SURGICAL PATHOLOGY

## 2015-02-01 ENCOUNTER — Ambulatory Visit (INDEPENDENT_AMBULATORY_CARE_PROVIDER_SITE_OTHER): Payer: 59 | Admitting: Family Medicine

## 2015-02-01 ENCOUNTER — Encounter: Payer: Self-pay | Admitting: Family Medicine

## 2015-02-01 VITALS — BP 138/80 | HR 95 | Temp 98.7°F | Resp 16 | Ht 60.0 in | Wt 204.3 lb

## 2015-02-01 DIAGNOSIS — J4520 Mild intermittent asthma, uncomplicated: Secondary | ICD-10-CM | POA: Diagnosis not present

## 2015-02-01 DIAGNOSIS — E1140 Type 2 diabetes mellitus with diabetic neuropathy, unspecified: Secondary | ICD-10-CM | POA: Diagnosis not present

## 2015-02-01 DIAGNOSIS — E1121 Type 2 diabetes mellitus with diabetic nephropathy: Secondary | ICD-10-CM

## 2015-02-01 DIAGNOSIS — I10 Essential (primary) hypertension: Secondary | ICD-10-CM

## 2015-02-01 DIAGNOSIS — K209 Esophagitis, unspecified without bleeding: Secondary | ICD-10-CM

## 2015-02-01 LAB — POCT GLYCOSYLATED HEMOGLOBIN (HGB A1C): Hemoglobin A1C: 7.9

## 2015-02-01 MED ORDER — ALBUTEROL SULFATE HFA 108 (90 BASE) MCG/ACT IN AERS: 2.0000 | INHALATION_SPRAY | Freq: Four times a day (QID) | RESPIRATORY_TRACT | Status: DC | PRN

## 2015-02-01 MED ORDER — EXENATIDE ER 2 MG ~~LOC~~ PEN: 1.0000 "pen " | PEN_INJECTOR | Freq: Once | SUBCUTANEOUS | Status: DC

## 2015-02-01 MED ORDER — GABAPENTIN 300 MG PO CAPS: 300.0000 mg | ORAL_CAPSULE | Freq: Three times a day (TID) | ORAL | Status: DC

## 2015-02-01 MED ORDER — EXENATIDE ER 2 MG ~~LOC~~ PEN: 1.0000 "pen " | PEN_INJECTOR | SUBCUTANEOUS | Status: DC

## 2015-02-01 MED ORDER — MELOXICAM 15 MG PO TABS: 15.0000 mg | ORAL_TABLET | Freq: Every day | ORAL | Status: DC

## 2015-02-01 MED ORDER — BECLOMETHASONE DIPROPIONATE 80 MCG/ACT IN AERS: 2.0000 | INHALATION_SPRAY | Freq: Every day | RESPIRATORY_TRACT | Status: DC

## 2015-02-01 NOTE — Progress Notes (Signed)
Name: Olivia Romero   MRN: [id number]    DOB: 02-Apr-1956   Date:02/01/2015       Progress Note  Subjective  Chief Complaint  Chief Complaint  Patient presents with  . Diabetes    Pt here for 1 month follow up visit    HPI   Diabetes mellitus with nephropathy and neuropathy  Patient's had diabetes for over 5 years. There is been questionable compliance intermittently and she's been off her meds for as much as several weeks' time. She has been seen by dietitian but does not follow her diet on a regular basis and is not currently exercising. Endorgan disease include nephropathy as well as neuropathy.. Her current regimen is supposed consist of metformin 500 mg 2 tabs 3 times a day glimepiride 2 mg 2 tabs daily. No polyuria polydipsia polyphagia is complained of.  Hypertension  Patient has hypertension for over 5 years. Her current medical regimen consist of lisinopril 10 mg once daily. No chest pain palpitations orthopnea. Extremities swelling. Respiratory cardiac disease include hypertension diabetes hyperlipidemia obesity sedentary lifestyle hypertriglyceridemia.  Hyperlipidemia  Patient currently on omega-3 fatty acids she is intolerant to statins.  Obesity  Long-standing history of obesity for greater than 10 years. They're inconsistent compliance with diet and exercise. She's been referred to dietitian past.  Asthma  Patient has a history of asthma. She only has a flare when she is exposed to certain chemicals at her place of employment. Currently she is not using her inhaler.      Past Medical History  Diagnosis Date  . Hemorrhoid     internal  . Neuropathy   . Diabetes mellitus without complication   . Hyperlipemia   . Stroke-like episode 2009    TIA  . Diverticulosis of colon   . Esophagitis   . Colon polyps     adenomatous  . Hypertension   . Carpal tunnel syndrome on right   . Asthma     Social History  Substance Use Topics  . Smoking status: Never  Smoker   . Smokeless tobacco: Never Used  . Alcohol Use: 0.0 oz/week    0 Standard drinks or equivalent per week     Comment: wine every 6-8 months     Current outpatient prescriptions:  .  acetaminophen (TYLENOL) 325 MG tablet, Take 2 tablets (650 mg total) by mouth every 6 (six) hours as needed for pain or fever (Mild pain)., Disp: , Rfl:  .  albuterol (PROVENTIL HFA;VENTOLIN HFA) 108 (90 BASE) MCG/ACT inhaler, Inhale 2 puffs into the lungs every 6 (six) hours as needed for wheezing or shortness of breath., Disp: 1 Inhaler, Rfl: 3 .  ALPRAZolam (XANAX) 0.5 MG tablet, Take 0.5 mg by mouth at bedtime as needed for anxiety., Disp: , Rfl:  .  amoxicillin (AMOXIL) 500 MG capsule, Take by mouth., Disp: , Rfl:  .  aspirin 325 MG tablet, Take 325 mg by mouth daily., Disp: , Rfl:  .  clarithromycin (BIAXIN) 500 MG tablet, Take by mouth., Disp: , Rfl:  .  gabapentin (NEURONTIN) 300 MG capsule, Take 1 capsule (300 mg total) by mouth 3 (three) times daily., Disp: 270 capsule, Rfl: 1 .  glimepiride (AMARYL) 2 MG tablet, Take 2 mg by mouth daily with breakfast., Disp: , Rfl:  .  HYDROcodone-acetaminophen (NORCO/VICODIN) 5-325 MG per tablet, Take 1 tablet by mouth every 8 (eight) hours as needed., Disp: 15 tablet, Rfl: 0 .  Icosapent Ethyl (VASCEPA) 1 G CAPS, Take 1,000  mg by mouth 3 (three) times daily., Disp: , Rfl:  .  lisinopril (PRINIVIL,ZESTRIL) 10 MG tablet, Take 10 mg by mouth daily., Disp: , Rfl:  .  meloxicam (MOBIC) 15 MG tablet, Take 1 tablet (15 mg total) by mouth daily., Disp: 30 tablet, Rfl: 0 .  metFORMIN (GLUCOPHAGE) 500 MG tablet, Take 1,000 mg by mouth 2 (two) times daily with a meal. , Disp: , Rfl:  .  metoprolol succinate (TOPROL-XL) 25 MG 24 hr tablet, Take 25 mg by mouth daily., Disp: , Rfl:  .  Multiple Vitamin (MULTIVITAMIN WITH MINERALS) TABS, Take 1 tablet by mouth daily., Disp: , Rfl:  .  omega-3 acid ethyl esters (LOVAZA) 1 G capsule, Take 2 g by mouth 2 (two) times daily.,  Disp: , Rfl:  .  omeprazole (PRILOSEC) 20 MG capsule, Take 20 mg by mouth daily., Disp: , Rfl:  .  ondansetron (ZOFRAN) 4 MG tablet, Take 1 tablet (4 mg total) by mouth every 8 (eight) hours as needed for nausea or vomiting., Disp: 20 tablet, Rfl: 0 .  sucralfate (CARAFATE) 1 G tablet, Take 1 tablet (1 g total) by mouth 2 (two) times daily., Disp: 30 tablet, Rfl: 0  Allergies  Allergen Reactions  . Atorvastatin     Unknown bothers liver  . Eggs Or Egg-Derived Products Other (See Comments)    Angioedema  . Flu Virus Vaccine   . Latex Itching  . Pravastatin Itching and Rash    ROS  Patient currently without fever chills myalgias weight loss headache visual disturbance nausea vomiting respiratory difficulty chest pain. She is experiencing peripheral neuropathy as well as some joint discomfort. There is no heat or cold intolerance. There's been no skin changes or rash. There is no problem with depression or anxiety currently.  Objective  Filed Vitals:   02/01/15 0846  BP: 138/80  Pulse: 95  Temp: 98.7 F (37.1 C)  Resp: 16  Height: 5' (1.524 m)  Weight: 204 lb 5 oz (92.676 kg)  SpO2: 97%     Physical Exam  Generally obese white female who is in no acute distress HEENT is benign neck supple carotids 2+ bilaterally without bruit lymphadenopathy or thyromegaly lungs are clear to auscultation heart regular rate and rhythm without gallop rub or murmur A overflow dorsal pedal and posterior tibial pulses are 2+. Abdomen is obese. There is 1+ pretibial edema. Dorsal pedal and posterior tibial pulses are 2+. Monofilament test shows decreased sensation to light touch with monofilament.   Assessment & Plan   1. DM neuropathy, type II diabetes mellitus Uncontrolled add by durian - POCT HgB A1C - Exenatide ER (BYDUREON) 2 MG PEN; Inject 1 pen into the skin every Monday.  Dispense: 1 each; Refill: 3 - Exenatide ER PEN 1 pen; Inject 1 pen into the skin once. - Fructosamine  2. Type 2  diabetes mellitus with diabetic nephropathy Uncontrolled adding by durian is above  3. Essential hypertension, benign Near goal - Comprehensive Metabolic Panel (CMET) - Lipid Profile - TSH  4. Esophagitis Improved after GI evaluation and treatment  5. Asthma, mild intermittent, uncomplicated Qvar 80 mg twice a day and Proventil HFA when necessary reassess in 1 month at which time a 3 PFT will be obtained again - Spirometry: Pre & Post Eval   Mild intermittent will add Qvar 80 mg 1 puff twice a day

## 2015-02-23 LAB — COMPREHENSIVE METABOLIC PANEL
ALBUMIN: 3.9 g/dL (ref 3.5–5.5)
ALK PHOS: 96 IU/L (ref 39–117)
ALT: 24 IU/L (ref 0–32)
AST: 17 IU/L (ref 0–40)
Albumin/Globulin Ratio: 1.4 (ref 1.1–2.5)
BUN/Creatinine Ratio: 32 — ABNORMAL HIGH (ref 9–23)
BUN: 8 mg/dL (ref 6–24)
Bilirubin Total: <0.2 mg/dL (ref 0.0–1.2)
CHLORIDE: 97 mmol/L (ref 97–108)
CO2: 21 mmol/L (ref 18–29)
CREATININE: 0.25 mg/dL — AB (ref 0.57–1.00)
Calcium: 9.4 mg/dL (ref 8.7–10.2)
GFR calc Af Amer: 156 mL/min/{1.73_m2} (ref >59)
GFR calc non Af Amer: 135 mL/min/{1.73_m2} (ref >59)
GLUCOSE: 216 mg/dL — AB (ref 65–99)
Globulin, Total: 2.8 g/dL (ref 1.5–4.5)
Potassium: 4.8 mmol/L (ref 3.5–5.2)
Sodium: 137 mmol/L (ref 134–144)
Total Protein: 6.7 g/dL (ref 6.0–8.5)

## 2015-02-23 LAB — LIPID PANEL
Chol/HDL Ratio: 12.2 ratio units — ABNORMAL HIGH (ref 0.0–4.4)
Cholesterol, Total: 292 mg/dL — ABNORMAL HIGH (ref 100–199)
HDL: 24 mg/dL — ABNORMAL LOW (ref >39)
Triglycerides: 1303 mg/dL (ref 0–149)

## 2015-02-23 LAB — TSH: TSH: 0.836 u[IU]/mL (ref 0.450–4.500)

## 2015-02-23 LAB — FRUCTOSAMINE: Fructosamine: 277 umol/L (ref 0–285)

## 2015-02-23 NOTE — Addendum Note (Signed)
Addended by: Lolita Rieger D on: 02/23/2015 03:57 PM   Modules accepted: Orders

## 2015-03-08 ENCOUNTER — Ambulatory Visit: Payer: 59 | Admitting: Family Medicine

## 2015-03-17 ENCOUNTER — Ambulatory Visit (INDEPENDENT_AMBULATORY_CARE_PROVIDER_SITE_OTHER): Payer: 59 | Admitting: Family Medicine

## 2015-03-17 ENCOUNTER — Encounter: Payer: Self-pay | Admitting: Family Medicine

## 2015-03-17 VITALS — BP 132/78 | HR 106 | Temp 98.7°F | Resp 16 | Ht 60.0 in | Wt 200.2 lb

## 2015-03-17 DIAGNOSIS — E1169 Type 2 diabetes mellitus with other specified complication: Secondary | ICD-10-CM

## 2015-03-17 DIAGNOSIS — M7701 Medial epicondylitis, right elbow: Secondary | ICD-10-CM | POA: Diagnosis not present

## 2015-03-17 DIAGNOSIS — D179 Benign lipomatous neoplasm, unspecified: Secondary | ICD-10-CM

## 2015-03-17 LAB — GLUCOSE, POCT (MANUAL RESULT ENTRY): POC Glucose: 153 mg/dl — AB (ref 70–99)

## 2015-03-17 MED ORDER — MELOXICAM 15 MG PO TABS: 15.0000 mg | ORAL_TABLET | Freq: Every day | ORAL | Status: DC

## 2015-03-17 MED ORDER — METFORMIN HCL 500 MG PO TABS: 1000.0000 mg | ORAL_TABLET | Freq: Two times a day (BID) | ORAL | Status: DC

## 2015-03-17 NOTE — Progress Notes (Signed)
Name: Olivia Romero   MRN: [id number]    DOB: [birth year]   Date:03/17/2015       Progress Note  Subjective  Chief Complaint  Chief Complaint  Patient presents with  . Diabetes    pt here for 1 month follow up    HPI  Diabetes follow-up  Since last visit patient has had Byduroin  2 mg weekly added to her regimen. She's noticed some decrease in her appetite with this and blood sugars have been running slightly lower.  Tendinitis   Complaint of pain and discomfort in the right elbow area. She is using a strap on the area and some improvement. Meloxicam is also improved helpful.  Lipoma of the right forearm  Complaint of a small knot on the right forearm she's noted intermittently.     Past Medical History  Diagnosis Date  . Hemorrhoid     internal  . Neuropathy ([place])   . Diabetes mellitus without complication ([place])   . Hyperlipemia   . Stroke-like episode ([place]) 2009    TIA  . Diverticulosis of colon   . Esophagitis   . Colon polyps     adenomatous  . Hypertension   . Carpal tunnel syndrome on right   . Asthma     Social History  Substance Use Topics  . Smoking status: Never Smoker   . Smokeless tobacco: Never Used  . Alcohol Use: 0.0 oz/week    0 Standard drinks or equivalent per week     Comment: wine every 6-8 months     Current outpatient prescriptions:  .  acetaminophen (TYLENOL) 325 MG tablet, Take 2 tablets (650 mg total) by mouth every 6 (six) hours as needed for pain or fever (Mild pain)., Disp: , Rfl:  .  albuterol (PROVENTIL HFA;VENTOLIN HFA) 108 (90 BASE) MCG/ACT inhaler, Inhale 2 puffs into the lungs every 6 (six) hours as needed for wheezing or shortness of breath., Disp: 1 Inhaler, Rfl: 3 .  ALPRAZolam (XANAX) 0.5 MG tablet, Take 0.5 mg by mouth at bedtime as needed for anxiety., Disp: , Rfl:  .  aspirin 325 MG tablet, Take 325 mg by mouth daily., Disp: , Rfl:  .  beclomethasone (QVAR) 80 MCG/ACT inhaler, Inhale 2 puffs into the lungs daily  at 3 pm., Disp: 1 Inhaler, Rfl: 12 .  Exenatide ER (BYDUREON) 2 MG PEN, Inject 1 pen into the skin every Monday., Disp: 1 each, Rfl: 3 .  gabapentin (NEURONTIN) 300 MG capsule, Take 1 capsule (300 mg total) by mouth 3 (three) times daily., Disp: 270 capsule, Rfl: 1 .  glimepiride (AMARYL) 2 MG tablet, Take 2 mg by mouth daily with breakfast., Disp: , Rfl:  .  HYDROcodone-acetaminophen (NORCO/VICODIN) 5-325 MG per tablet, Take 1 tablet by mouth every 8 (eight) hours as needed., Disp: 15 tablet, Rfl: 0 .  Icosapent Ethyl (VASCEPA) 1 G CAPS, Take 1,000 mg by mouth 3 (three) times daily., Disp: , Rfl:  .  lisinopril (PRINIVIL,ZESTRIL) 10 MG tablet, Take 10 mg by mouth daily., Disp: , Rfl:  .  meloxicam (MOBIC) 15 MG tablet, Take 1 tablet (15 mg total) by mouth daily., Disp: 30 tablet, Rfl: 0 .  metFORMIN (GLUCOPHAGE) 500 MG tablet, Take 2 tablets (1,000 mg total) by mouth 2 (two) times daily with a meal., Disp: 120 tablet, Rfl: 3 .  metoprolol succinate (TOPROL-XL) 25 MG 24 hr tablet, Take 25 mg by mouth daily., Disp: , Rfl:  .  Multiple Vitamin (MULTIVITAMIN WITH MINERALS)  TABS, Take 1 tablet by mouth daily., Disp: , Rfl:  .  omega-3 acid ethyl esters (LOVAZA) 1 G capsule, Take 2 g by mouth 2 (two) times daily., Disp: , Rfl:  .  omeprazole (PRILOSEC) 20 MG capsule, Take 20 mg by mouth daily., Disp: , Rfl:  .  ondansetron (ZOFRAN) 4 MG tablet, Take 1 tablet (4 mg total) by mouth every 8 (eight) hours as needed for nausea or vomiting., Disp: 20 tablet, Rfl: 0 .  sucralfate (CARAFATE) 1 G tablet, Take 1 tablet (1 g total) by mouth 2 (two) times daily., Disp: 30 tablet, Rfl: 0  Current facility-administered medications:  .  Exenatide ER PEN 1 pen, 1 pen, Subcutaneous, Once, Ashok Norris, MD  Allergies  Allergen Reactions  . Atorvastatin     Unknown bothers liver  . Eggs Or Egg-Derived Products Other (See Comments)    Angioedema  . Flu Virus Vaccine   . Latex Itching  . Pravastatin Itching and  Rash    Review of Systems  Constitutional: Negative for fever, chills and weight loss.       Morbidly obese white female  HENT: Negative for congestion, hearing loss, sore throat and tinnitus.   Eyes: Negative for blurred vision, double vision and redness.  Respiratory: Negative for cough, hemoptysis and shortness of breath.   Cardiovascular: Negative for chest pain, palpitations, orthopnea, claudication and leg swelling.  Gastrointestinal: Negative for heartburn, nausea, vomiting, diarrhea, constipation and blood in stool.  Genitourinary: Negative for dysuria, urgency, frequency and hematuria.  Musculoskeletal: Negative for myalgias, back pain, joint pain, falls and neck pain.  Skin: Negative for itching and rash.       There is a 1 cm palpable on the dorsal aspect of the right forearm. History of mobile and nontender Skin nontender.  Neurological: Negative for dizziness, tingling, tremors, focal weakness, seizures, loss of consciousness, weakness and headaches.  Endo/Heme/Allergies: Does not bruise/bleed easily.  Psychiatric/Behavioral: Negative for depression and substance abuse. The patient is not nervous/anxious and does not have insomnia.      Objective  Filed Vitals:   03/17/15 1230  BP: 132/78  Pulse: 106  Temp: 98.7 F (37.1 C)  Resp: 16  Height: 5' (1.524 m)  Weight: 200 lb 3 oz (90.804 kg)  SpO2: 96%     Physical Exam  Constitutional: She is oriented to person, place, and time and well-developed, well-nourished, and in no distress.  Morbid obese white female who is no acute distress  HENT:  Head: Normocephalic.  Eyes: EOM are normal. Pupils are equal, round, and reactive to light.  Neck: Normal range of motion. No thyromegaly present.  Cardiovascular: Normal rate, regular rhythm, normal heart sounds and intact distal pulses.   No murmur heard. Pulmonary/Chest: Effort normal and breath sounds normal.  Musculoskeletal: She exhibits no edema.  Strap just distal  to the right lateral epicondyle is tender to palpation  Neurological: She is alert and oriented to person, place, and time. No cranial nerve deficit. Gait normal.  Skin: Skin is warm and dry. No rash noted.  Psychiatric: Memory and affect normal.      Assessment & Plan

## 2015-03-19 NOTE — Progress Notes (Signed)
Name: Olivia Romero   MRN: [id number]    DOB: 1956-01-17   Date:03/19/2015       Progress Note  Subjective  Chief Complaint  Chief Complaint  Patient presents with  . Diabetes    pt here for 1 month follow up    HPI  Patient here for one-month follow-up of diabetes mellitus. Glucoses are running better. She was started on by durian 2 mg once weekly which she has been taking consistent basis no nausea vomiting abdominal pain is associated.  Epicondylitis  Patient now has a strap just below her right elbow because of epicondylitis. She's been on NSAID as well. Overall there is some improvement. At this point she is not in favor seeing orthopedist regarding this and injection.  Lipoma  Patient complains of a knot on the inferior aspect of her right upper arm. She feels intermittently. It is not painful and it is mobile. .  Past Medical History  Diagnosis Date  . Hemorrhoid     internal  . Neuropathy ([place])   . Diabetes mellitus without complication ([place])   . Hyperlipemia   . Stroke-like episode ([place]) 2009    TIA  . Diverticulosis of colon   . Esophagitis   . Colon polyps     adenomatous  . Hypertension   . Carpal tunnel syndrome on right   . Asthma     Social History  Substance Use Topics  . Smoking status: Never Smoker   . Smokeless tobacco: Never Used  . Alcohol Use: 0.0 oz/week    0 Standard drinks or equivalent per week     Comment: wine every 6-8 months     Current outpatient prescriptions:  .  acetaminophen (TYLENOL) 325 MG tablet, Take 2 tablets (650 mg total) by mouth every 6 (six) hours as needed for pain or fever (Mild pain)., Disp: , Rfl:  .  albuterol (PROVENTIL HFA;VENTOLIN HFA) 108 (90 BASE) MCG/ACT inhaler, Inhale 2 puffs into the lungs every 6 (six) hours as needed for wheezing or shortness of breath., Disp: 1 Inhaler, Rfl: 3 .  ALPRAZolam (XANAX) 0.5 MG tablet, Take 0.5 mg by mouth at bedtime as needed for anxiety., Disp: , Rfl:  .  aspirin 325  MG tablet, Take 325 mg by mouth daily., Disp: , Rfl:  .  beclomethasone (QVAR) 80 MCG/ACT inhaler, Inhale 2 puffs into the lungs daily at 3 pm., Disp: 1 Inhaler, Rfl: 12 .  Exenatide ER (BYDUREON) 2 MG PEN, Inject 1 pen into the skin every Monday., Disp: 1 each, Rfl: 3 .  gabapentin (NEURONTIN) 300 MG capsule, Take 1 capsule (300 mg total) by mouth 3 (three) times daily., Disp: 270 capsule, Rfl: 1 .  glimepiride (AMARYL) 2 MG tablet, Take 2 mg by mouth daily with breakfast., Disp: , Rfl:  .  HYDROcodone-acetaminophen (NORCO/VICODIN) 5-325 MG per tablet, Take 1 tablet by mouth every 8 (eight) hours as needed., Disp: 15 tablet, Rfl: 0 .  Icosapent Ethyl (VASCEPA) 1 G CAPS, Take 1,000 mg by mouth 3 (three) times daily., Disp: , Rfl:  .  lisinopril (PRINIVIL,ZESTRIL) 10 MG tablet, Take 10 mg by mouth daily., Disp: , Rfl:  .  meloxicam (MOBIC) 15 MG tablet, Take 1 tablet (15 mg total) by mouth daily., Disp: 30 tablet, Rfl: 0 .  meloxicam (MOBIC) 15 MG tablet, Take 1 tablet (15 mg total) by mouth daily., Disp: 30 tablet, Rfl: 0 .  metFORMIN (GLUCOPHAGE) 500 MG tablet, Take 2 tablets (1,000 mg total) by  mouth 2 (two) times daily with a meal., Disp: 120 tablet, Rfl: 3 .  metoprolol succinate (TOPROL-XL) 25 MG 24 hr tablet, Take 25 mg by mouth daily., Disp: , Rfl:  .  Multiple Vitamin (MULTIVITAMIN WITH MINERALS) TABS, Take 1 tablet by mouth daily., Disp: , Rfl:  .  omega-3 acid ethyl esters (LOVAZA) 1 G capsule, Take 2 g by mouth 2 (two) times daily., Disp: , Rfl:  .  omeprazole (PRILOSEC) 20 MG capsule, Take 20 mg by mouth daily., Disp: , Rfl:  .  ondansetron (ZOFRAN) 4 MG tablet, Take 1 tablet (4 mg total) by mouth every 8 (eight) hours as needed for nausea or vomiting., Disp: 20 tablet, Rfl: 0 .  sucralfate (CARAFATE) 1 G tablet, Take 1 tablet (1 g total) by mouth 2 (two) times daily., Disp: 30 tablet, Rfl: 0  Current facility-administered medications:  .  Exenatide ER PEN 1 pen, 1 pen, Subcutaneous,  Once, Ashok Norris, MD  Allergies  Allergen Reactions  . Atorvastatin     Unknown bothers liver  . Eggs Or Egg-Derived Products Other (See Comments)    Angioedema  . Flu Virus Vaccine   . Latex Itching  . Pravastatin Itching and Rash    Review of Systems  Constitutional: Negative for fever, chills and weight loss.  HENT: Negative for congestion, hearing loss, sore throat and tinnitus.   Eyes: Negative for blurred vision, double vision and redness.  Respiratory: Negative for cough, hemoptysis and shortness of breath.   Cardiovascular: Negative for chest pain, palpitations, orthopnea, claudication and leg swelling.  Gastrointestinal: Negative for heartburn, nausea, vomiting, diarrhea, constipation and blood in stool.  Genitourinary: Negative for dysuria, urgency, frequency and hematuria.  Musculoskeletal: Negative for myalgias, back pain, joint pain, falls and neck pain.  Skin: Negative for itching.       Mass in the inferior aspect of the right upper arm.  Neurological: Positive for sensory change. Negative for dizziness, tingling, tremors, focal weakness, seizures, loss of consciousness, weakness and headaches.  Endo/Heme/Allergies: Does not bruise/bleed easily.  Psychiatric/Behavioral: Negative for depression and substance abuse. The patient is not nervous/anxious and does not have insomnia.      Objective  Filed Vitals:   03/17/15 1230  BP: 132/78  Pulse: 106  Temp: 98.7 F (37.1 C)  Resp: 16  Height: 5' (1.524 m)  Weight: 200 lb 3 oz (90.804 kg)  SpO2: 96%     Physical Exam  Constitutional: She is oriented to person, place, and time and well-developed, well-nourished, and in no distress.  Morbidly obese female in no acute distress  HENT:  Head: Normocephalic.  Eyes: EOM are normal. Pupils are equal, round, and reactive to light.  Neck: Normal range of motion. No thyromegaly present.  Cardiovascular: Normal rate, regular rhythm, normal heart sounds and intact  distal pulses.   No murmur heard. Pulmonary/Chest: Effort normal and breath sounds normal.  Abdominal: Soft. Bowel sounds are normal.  Musculoskeletal: Normal range of motion. She exhibits no edema.  Neurological: She is alert and oriented to person, place, and time. No cranial nerve deficit. Gait normal.  Skin: Skin is warm and dry. No rash noted.  There is a 1 cm lesion with the consistency of a lipoma in the area of the triceps muscle the right arm. It is freely mobile and nontender  Psychiatric: Memory and affect normal.      Assessment & Plan  1. Type 2 diabetes mellitus with other specified complication ([place]) Continue Bydurion recheck -  POCT Glucose (CBG)  2. Epicondylitis elbow, medial, right Continue current regimen  3. Lipoma Explained benign nature of this lesion and a handout is given

## 2015-04-21 ENCOUNTER — Ambulatory Visit: Payer: 59 | Admitting: Family Medicine

## 2015-04-23 ENCOUNTER — Ambulatory Visit (INDEPENDENT_AMBULATORY_CARE_PROVIDER_SITE_OTHER): Payer: 59 | Admitting: Family Medicine

## 2015-04-23 ENCOUNTER — Encounter: Payer: Self-pay | Admitting: Family Medicine

## 2015-04-23 VITALS — BP 124/78 | HR 95 | Temp 98.2°F | Resp 16 | Ht 60.0 in | Wt 203.0 lb

## 2015-04-23 DIAGNOSIS — K3184 Gastroparesis: Secondary | ICD-10-CM

## 2015-04-23 DIAGNOSIS — M150 Primary generalized (osteo)arthritis: Secondary | ICD-10-CM | POA: Diagnosis not present

## 2015-04-23 DIAGNOSIS — E785 Hyperlipidemia, unspecified: Secondary | ICD-10-CM | POA: Diagnosis not present

## 2015-04-23 DIAGNOSIS — E1169 Type 2 diabetes mellitus with other specified complication: Secondary | ICD-10-CM

## 2015-04-23 DIAGNOSIS — E781 Pure hyperglyceridemia: Secondary | ICD-10-CM | POA: Insufficient documentation

## 2015-04-23 DIAGNOSIS — Z889 Allergy status to unspecified drugs, medicaments and biological substances status: Secondary | ICD-10-CM

## 2015-04-23 DIAGNOSIS — E1140 Type 2 diabetes mellitus with diabetic neuropathy, unspecified: Secondary | ICD-10-CM | POA: Diagnosis not present

## 2015-04-23 DIAGNOSIS — G5603 Carpal tunnel syndrome, bilateral upper limbs: Secondary | ICD-10-CM

## 2015-04-23 DIAGNOSIS — Z789 Other specified health status: Secondary | ICD-10-CM | POA: Insufficient documentation

## 2015-04-23 DIAGNOSIS — I10 Essential (primary) hypertension: Secondary | ICD-10-CM

## 2015-04-23 DIAGNOSIS — E1143 Type 2 diabetes mellitus with diabetic autonomic (poly)neuropathy: Secondary | ICD-10-CM | POA: Insufficient documentation

## 2015-04-23 DIAGNOSIS — E1121 Type 2 diabetes mellitus with diabetic nephropathy: Secondary | ICD-10-CM

## 2015-04-23 DIAGNOSIS — F419 Anxiety disorder, unspecified: Secondary | ICD-10-CM | POA: Insufficient documentation

## 2015-04-23 DIAGNOSIS — M159 Polyosteoarthritis, unspecified: Secondary | ICD-10-CM | POA: Insufficient documentation

## 2015-04-23 HISTORY — DX: Other specified health status: Z78.9

## 2015-04-23 LAB — GLUCOSE, POCT (MANUAL RESULT ENTRY): POC GLUCOSE: 152 mg/dL — AB (ref 70–99)

## 2015-04-23 LAB — POCT GLYCOSYLATED HEMOGLOBIN (HGB A1C): Hemoglobin A1C: 8

## 2015-04-23 MED ORDER — TORSEMIDE 20 MG PO TABS: 20.0000 mg | ORAL_TABLET | ORAL | Status: DC

## 2015-04-23 MED ORDER — MELOXICAM 15 MG PO TABS: 15.0000 mg | ORAL_TABLET | Freq: Every day | ORAL | Status: DC

## 2015-04-23 MED ORDER — ALPRAZOLAM 0.5 MG PO TABS: 0.5000 mg | ORAL_TABLET | Freq: Two times a day (BID) | ORAL | Status: DC | PRN

## 2015-04-23 MED ORDER — EZETIMIBE 10 MG PO TABS: 10.0000 mg | ORAL_TABLET | Freq: Every day | ORAL | Status: DC

## 2015-04-23 NOTE — Progress Notes (Signed)
Name: Olivia Romero   MRN: [id number]    DOB: 1956-02-28   Date:04/23/2015       Progress Note  Subjective  Chief Complaint  Chief Complaint  Patient presents with  . Diabetes    3 month follow up  . Hypertension  . Hyperlipidemia  . Anxiety    HPI  Diabetes  Patient presents for follow-up of diabetes which is present for over 5 years. Is currently on a regimen of   exenatide 2 mg once weekly weekly and metformin 500 mg twice a day.                                                      . Patient states she is occasionally compliant with their diet and exercise. There's been no hypoglycemic episodes and there no polyuria polydipsia polyphagia. His average fasting glucoses been in the low around low to mid 150s with a high around higher 100s . There is no end organ disease.  Last diabetic eye exam was earlier this year.   Last visit with dietitian was earlier this year. Last microalbumin was obtained this year .   Hypertension   Patient presents for follow-up of hypertension. It has been present for over  years.  Patient states that there is compliance with medical regimen which consists of lisinopril 10 mg daily and metoprolol XL 25 mg daily  . There is no end organ disease. Cardiac risk factors include hypertension hyperlipidemia and diabetes.  Exercise regimen consist ofminimal walking  . diet and supposedly an ADA diet   Hyperlipidemia  Patient has a history of hyperlipidemia forOver 5.  Current medical regimen consisomega-3 ethyl ester.  Compliance is Good.  Diet and exercise are currently followed intermittently diabetes hypertension hyperlipidemia obesity sedentary lifestylectors for cardiovascular disease include hyperlipihe is There have been no side effects from the medication.    Osteoarthritis  Patient complains of multiple joint complaints. Particularly she has some discomfort in her hands as well as extending her wrist no swelling. She seen some improvement with  meloxicam. 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  Past Medical History  Diagnosis Date  . Hemorrhoid     internal  . Neuropathy ([place])   . Diabetes mellitus without complication ([place])   . Hyperlipemia   . Stroke-like episode ([place]) 2009    TIA  . Diverticulosis of colon   . Esophagitis   . Colon polyps     adenomatous  . Hypertension   . Carpal tunnel syndrome on right   . Asthma     Social History  Substance Use Topics  . Smoking status: Never Smoker   . Smokeless tobacco: Never Used  . Alcohol Use: 0.0 oz/week    0 Standard drinks or equivalent per week     Comment: wine every 6-8 months     Current outpatient prescriptions:  .  acetaminophen (TYLENOL) 325 MG tablet, Take 2 tablets (650 mg total) by mouth every 6 (six) hours as needed for pain or fever (Mild pain)., Disp: , Rfl:  .  albuterol (PROVENTIL HFA;VENTOLIN HFA) 108 (90 BASE) MCG/ACT inhaler, Inhale 2 puffs into the lungs every 6 (six) hours as needed for wheezing or shortness of breath., Disp: 1 Inhaler, Rfl: 3 .  ALPRAZolam (XANAX) 0.5 MG tablet, Take 0.5 mg by mouth at bedtime as needed for anxiety., Disp: , Rfl:  .  ALPRAZolam (XANAX) 0.5 MG tablet, Take 1 tablet (0.5 mg total) by mouth 2 (two) times daily as needed for anxiety., Disp: 30 tablet, Rfl: 2 .  aspirin 325 MG tablet, Take 325 mg by mouth daily., Disp: , Rfl:  .  beclomethasone (QVAR) 80 MCG/ACT inhaler, Inhale 2 puffs into the lungs daily at 3 pm., Disp: 1 Inhaler, Rfl: 12 .  Exenatide ER (BYDUREON) 2 MG PEN, Inject 1 pen into the skin every Monday., Disp: 1 each, Rfl: 3 .  gabapentin (NEURONTIN) 300 MG capsule, Take 1 capsule (300 mg total) by mouth 3 (three) times daily., Disp: 270 capsule, Rfl: 1 .  glimepiride (AMARYL) 2 MG tablet, Take 2 mg by mouth daily with breakfast., Disp: , Rfl:  .  HYDROcodone-acetaminophen (NORCO/VICODIN) 5-325 MG per tablet, Take 1 tablet  by mouth every 8 (eight) hours as needed., Disp: 15 tablet, Rfl: 0 .  Icosapent Ethyl (VASCEPA) 1 G CAPS, Take 1,000 mg by mouth 3 (three) times daily., Disp: , Rfl:  .  lisinopril (PRINIVIL,ZESTRIL) 10 MG tablet, Take 10 mg by mouth daily., Disp: , Rfl:  .  meloxicam (MOBIC) 15 MG tablet, Take 1 tablet (15 mg total) by mouth daily., Disp: 30 tablet, Rfl: 0 .  meloxicam (MOBIC) 15 MG tablet, Take 1 tablet (15 mg total) by mouth daily., Disp: 30 tablet, Rfl: 0 .  metFORMIN (GLUCOPHAGE) 500 MG tablet, Take 2 tablets (1,000 mg total) by mouth 2 (two) times daily with a meal., Disp: 120 tablet, Rfl: 3 .  metoprolol succinate (TOPROL-XL) 25 MG 24 hr tablet, Take 25 mg by mouth daily., Disp: , Rfl:  .  Multiple Vitamin (MULTIVITAMIN WITH MINERALS) TABS, Take 1 tablet by mouth daily., Disp: , Rfl:  .  omega-3 acid ethyl esters (LOVAZA) 1 G capsule, Take 2 g by mouth 2 (two) times daily., Disp: , Rfl:  .  omeprazole (PRILOSEC) 20 MG capsule, Take 20 mg by mouth daily., Disp: , Rfl:  .  ondansetron (ZOFRAN) 4 MG tablet, Take 1 tablet (4 mg total) by mouth every 8 (eight) hours as needed for nausea or vomiting., Disp: 20 tablet, Rfl: 0 .  sucralfate (CARAFATE) 1 G tablet, Take 1 tablet (1 g total) by mouth 2 (two) times daily., Disp: 30 tablet, Rfl: 0 .  torsemide (DEMADEX) 20 MG tablet, Take 1 tablet (20 mg total) by mouth every other day., Disp: 15 tablet, Rfl: 2  Current facility-administered medications:  .  Exenatide ER PEN 1 pen, 1 pen, Subcutaneous, Once, Ashok Norris, MD  Allergies  Allergen Reactions  . Atorvastatin     Unknown bothers liver  . Eggs Or Egg-Derived Products Other (See Comments)    Angioedema  . Flu Virus Vaccine   . Latex Itching  . Pravastatin Itching and Rash    Review of Systems  Constitutional: Positive for malaise/fatigue. Negative for fever, chills and weight loss.  HENT: Negative for congestion, hearing loss, sore throat and tinnitus.   Eyes: Negative for  blurred vision, double vision and redness.  Respiratory: Negative for cough, hemoptysis and shortness of breath.   Cardiovascular: Positive for leg swelling. Negative for chest pain, palpitations, orthopnea and claudication.  Gastrointestinal: Negative for heartburn, nausea, vomiting, diarrhea, constipation and blood in stool.  Genitourinary: Negative for dysuria, urgency, frequency and hematuria.  Musculoskeletal: Positive for myalgias and joint pain. Negative for back pain,  falls and neck pain.  Skin: Negative for itching.  Neurological: Positive for tingling and sensory change. Negative for dizziness, tremors, focal weakness, seizures, loss of consciousness, weakness and headaches.  Endo/Heme/Allergies: Does not bruise/bleed easily.  Psychiatric/Behavioral: Negative for depression and substance abuse. The patient is nervous/anxious. The patient does not have insomnia.      Objective  Filed Vitals:   04/23/15 0738  BP: 124/78  Pulse: 95  Temp: 98.2 F (36.8 C)  TempSrc: Oral  Resp: 16  Height: 5' (1.524 m)  Weight: 203 lb (92.08 kg)  SpO2: 97%     Physical Exam  Constitutional: She is oriented to person, place, and time.  Morbidly obese but in no acute distress  HENT:  Head: Normocephalic.  Eyes: EOM are normal. Pupils are equal, round, and reactive to light.  Neck: Normal range of motion. No thyromegaly present.  Cardiovascular: Normal rate, regular rhythm and normal heart sounds.   No murmur heard. Pulmonary/Chest: Effort normal and breath sounds normal.  Abdominal: Soft. Bowel sounds are normal.  Musculoskeletal: Normal range of motion. She exhibits tenderness. She exhibits no edema.  Neurological: She is alert and oriented to person, place, and time. No cranial nerve deficit. Gait normal.  Skin: Skin is warm and dry. No rash noted.  Psychiatric: Memory and affect normal.      Assessment & Plan  1. Type 2 diabetes mellitus with other specified complication  ([place]) Still not at goal encourage exercise and dietary compliance  2. Type 2 diabetes mellitus with diabetic neuropathy, without long-term current use of insulin ([facility]) As above - POCT HgB A1C - POCT Glucose (CBG)  3. Essential hypertension, benign Well-controlled   4. Diabetic gastroparesis ([place]) They need a trial of Reglan if worsens  5. Acute anxiety Xanax when necessary  6. Hyperlipidemia Trial of Zetia  7. Statin intolerance As above  8. Primary osteoarthritis involving multiple joints Meloxicam 15 mg daily  9. Type 2 diabetes mellitus with diabetic nephropathy, without long-term current use of insulin ([facility]) Continue gabapentin  10. Bilateral carpal tunnel syndrome Continue meloxicam

## 2015-05-12 ENCOUNTER — Telehealth: Payer: Self-pay | Admitting: Family Medicine

## 2015-05-12 DIAGNOSIS — G4733 Obstructive sleep apnea (adult) (pediatric): Secondary | ICD-10-CM

## 2015-05-12 NOTE — Telephone Encounter (Signed)
On last visit patient was told that she wold be set up for a referral for her sleep study. Patient called today checking status on it. Please return call

## 2015-05-13 NOTE — Telephone Encounter (Signed)
Order put in to be scheduled

## 2015-06-24 ENCOUNTER — Encounter: Payer: Self-pay | Admitting: Family Medicine

## 2015-06-24 ENCOUNTER — Ambulatory Visit (INDEPENDENT_AMBULATORY_CARE_PROVIDER_SITE_OTHER): Payer: 59 | Admitting: Family Medicine

## 2015-06-24 ENCOUNTER — Ambulatory Visit
Admission: RE | Admit: 2015-06-24 | Discharge: 2015-06-24 | Disposition: A | Discharge status: Home / Self Care | Payer: 59 | Source: Ambulatory Visit | Attending: Family Medicine | Admitting: Family Medicine

## 2015-06-24 VITALS — BP 128/78 | HR 100 | Temp 98.1°F | Resp 18 | Ht 60.0 in | Wt 207.2 lb

## 2015-06-24 DIAGNOSIS — R109 Unspecified abdominal pain: Secondary | ICD-10-CM

## 2015-06-24 DIAGNOSIS — K3184 Gastroparesis: Secondary | ICD-10-CM

## 2015-06-24 DIAGNOSIS — E1143 Type 2 diabetes mellitus with diabetic autonomic (poly)neuropathy: Secondary | ICD-10-CM | POA: Diagnosis not present

## 2015-06-24 DIAGNOSIS — G5603 Carpal tunnel syndrome, bilateral upper limbs: Secondary | ICD-10-CM

## 2015-06-24 DIAGNOSIS — G4733 Obstructive sleep apnea (adult) (pediatric): Secondary | ICD-10-CM | POA: Diagnosis not present

## 2015-06-24 NOTE — Progress Notes (Signed)
Name: Olivia Romero   MRN: [id number]    DOB: April 06, 1956   Date:06/24/2015       Progress Note  Subjective  Chief Complaint  Chief Complaint  Patient presents with  . Constipation  . Abdominal Pain    HPI  Constipation and abdominal pain.  Patient presents with a several week history of abdominal pain and recurrent constipation. It is of note that she was doing well with over-the-counter MiraLAX as stopped taking it over the last several days weeks. There is no nausea and vomiting no melena nor medication noted. There's been no weight loss. She had a colonoscopy performed in 6 of 14 which was unremarkable.   Diabetes with gastroparesis  Patient continues to use Reglan for diabetic gastroparesis. She has done better with following her diet. Blood sugars have been decreasing and she reports last A1c was 8 on 04/23/2015.  FLMA patient brought in for my patient presents for completion on today's well  Obstructive sleep apnea  Patient has long-standing history of snoring and apneic episodes which have been witnessed somnolence during the day difficulty with focusing and awakening with gasping at night. She is morbidly obese as well. Past Medical History  Diagnosis Date  . Hemorrhoid     internal  . Neuropathy ([place])   . Diabetes mellitus without complication ([place])   . Hyperlipemia   . Stroke-like episode ([place]) 2009    TIA  . Diverticulosis of colon   . Esophagitis   . Colon polyps     adenomatous  . Hypertension   . Carpal tunnel syndrome on right   . Asthma     Social History  Substance Use Topics  . Smoking status: Never Smoker   . Smokeless tobacco: Never Used  . Alcohol Use: 0.0 oz/week    0 Standard drinks or equivalent per week     Comment: wine every 6-8 months     Current outpatient prescriptions:  .  acetaminophen (TYLENOL) 325 MG tablet, Take 2 tablets (650 mg total) by mouth every 6 (six) hours as needed for pain or fever (Mild pain)., Disp: , Rfl:  .   albuterol (PROVENTIL HFA;VENTOLIN HFA) 108 (90 BASE) MCG/ACT inhaler, Inhale 2 puffs into the lungs every 6 (six) hours as needed for wheezing or shortness of breath., Disp: 1 Inhaler, Rfl: 3 .  ALPRAZolam (XANAX) 0.5 MG tablet, Take 0.5 mg by mouth at bedtime as needed for anxiety., Disp: , Rfl:  .  ALPRAZolam (XANAX) 0.5 MG tablet, Take 1 tablet (0.5 mg total) by mouth 2 (two) times daily as needed for anxiety., Disp: 30 tablet, Rfl: 2 .  aspirin 325 MG tablet, Take 325 mg by mouth daily., Disp: , Rfl:  .  beclomethasone (QVAR) 80 MCG/ACT inhaler, Inhale 2 puffs into the lungs daily at 3 pm., Disp: 1 Inhaler, Rfl: 12 .  Exenatide ER (BYDUREON) 2 MG PEN, Inject 1 pen into the skin every Monday., Disp: 1 each, Rfl: 3 .  ezetimibe (ZETIA) 10 MG tablet, Take 1 tablet (10 mg total) by mouth daily., Disp: 90 tablet, Rfl: 1 .  gabapentin (NEURONTIN) 300 MG capsule, Take 1 capsule (300 mg total) by mouth 3 (three) times daily. (Patient taking differently: Take 400 mg by mouth 4 (four) times daily. ), Disp: 270 capsule, Rfl: 1 .  glimepiride (AMARYL) 2 MG tablet, Take 2 mg by mouth daily with breakfast., Disp: , Rfl:  .  HYDROcodone-acetaminophen (NORCO/VICODIN) 5-325 MG per tablet, Take 1 tablet by mouth every  8 (eight) hours as needed., Disp: 15 tablet, Rfl: 0 .  Icosapent Ethyl (VASCEPA) 1 G CAPS, Take 1,000 mg by mouth 3 (three) times daily., Disp: , Rfl:  .  lisinopril (PRINIVIL,ZESTRIL) 10 MG tablet, Take 10 mg by mouth daily., Disp: , Rfl:  .  meloxicam (MOBIC) 15 MG tablet, Take 1 tablet (15 mg total) by mouth daily., Disp: 90 tablet, Rfl: 1 .  metFORMIN (GLUCOPHAGE) 500 MG tablet, Take 2 tablets (1,000 mg total) by mouth 2 (two) times daily with a meal., Disp: 120 tablet, Rfl: 3 .  metoprolol succinate (TOPROL-XL) 25 MG 24 hr tablet, Take 25 mg by mouth daily., Disp: , Rfl:  .  Multiple Vitamin (MULTIVITAMIN WITH MINERALS) TABS, Take 1 tablet by mouth daily., Disp: , Rfl:  .  omega-3 acid ethyl  esters (LOVAZA) 1 G capsule, Take 2 g by mouth 2 (two) times daily., Disp: , Rfl:  .  omeprazole (PRILOSEC) 20 MG capsule, Take 20 mg by mouth daily., Disp: , Rfl:  .  ondansetron (ZOFRAN) 4 MG tablet, Take 1 tablet (4 mg total) by mouth every 8 (eight) hours as needed for nausea or vomiting., Disp: 20 tablet, Rfl: 0 .  sucralfate (CARAFATE) 1 G tablet, Take 1 tablet (1 g total) by mouth 2 (two) times daily., Disp: 30 tablet, Rfl: 0 .  torsemide (DEMADEX) 20 MG tablet, Take 1 tablet (20 mg total) by mouth every other day., Disp: 15 tablet, Rfl: 2  Current facility-administered medications:  .  Exenatide ER PEN 1 pen, 1 pen, Subcutaneous, Once, Ashok Norris, MD  Allergies  Allergen Reactions  . Atorvastatin     Unknown bothers liver  . Eggs Or Egg-Derived Products Other (See Comments)    Angioedema  . Flu Virus Vaccine   . Latex Itching  . Pravastatin Itching and Rash    Review of Systems  Constitutional: Positive for malaise/fatigue. Negative for fever, chills and weight loss.       Obesity no acute distress  HENT: Negative for congestion, hearing loss, sore throat and tinnitus.   Eyes: Positive for blurred vision. Negative for double vision and redness.  Respiratory: Negative for cough, hemoptysis and shortness of breath.        Episodes of snoring and apnea  Cardiovascular: Positive for leg swelling. Negative for chest pain, palpitations, orthopnea and claudication.  Gastrointestinal: Positive for abdominal pain and constipation. Negative for heartburn, nausea, vomiting, diarrhea and blood in stool.  Genitourinary: Negative for dysuria, urgency, frequency and hematuria.  Musculoskeletal: Positive for back pain and joint pain. Negative for myalgias, falls and neck pain.  Skin: Negative for itching.  Neurological: Positive for dizziness and sensory change. Negative for tingling, tremors, focal weakness, seizures, loss of consciousness, weakness and headaches.  Endo/Heme/Allergies:  Does not bruise/bleed easily.  Psychiatric/Behavioral: Negative for depression and substance abuse. The patient is nervous/anxious. The patient does not have insomnia.      Objective  Filed Vitals:   06/24/15 1055  BP: 128/78  Pulse: 100  Temp: 98.1 F (36.7 C)  Resp: 18  Height: 5' (1.524 m)  Weight: 207 lb 4 oz (94.008 kg)  SpO2: 96%     Physical Exam  Constitutional: She is oriented to person, place, and time and well-developed, well-nourished, and in no distress.  Obesity no acute distress  HENT:  Head: Normocephalic.  Eyes: EOM are normal. Pupils are equal, round, and reactive to light.  Neck: Normal range of motion. No thyromegaly present.  Cardiovascular: Normal rate,  regular rhythm and normal heart sounds.   No murmur heard. Pulmonary/Chest: Effort normal and breath sounds normal.  Abdominal: Soft. Bowel sounds are normal. Distention: tenderness diffusely without masses. There is tenderness (mild tenderness diffusely without masses).  Musculoskeletal: Normal range of motion. She exhibits no edema.  Neurological: She is alert and oriented to person, place, and time. No cranial nerve deficit. Gait normal.  Skin: Skin is warm and dry. No rash noted.  Psychiatric: Memory and affect normal.      Assessment & Plan  1. Abdominal pain, unspecified abdominal location Renew her MiraLAX - DG Abd 2 Views; Future  2. OSA (obstructive sleep apnea) Sleep study - Ambulatory referral to Sleep Studies  3. Bilateral carpal tunnel syndrome Continue use braces  4. Diabetic gastroparesis ([place]) Continue Reglan and work for better diabetic control

## 2015-06-25 DIAGNOSIS — G4733 Obstructive sleep apnea (adult) (pediatric): Secondary | ICD-10-CM | POA: Insufficient documentation

## 2015-07-13 ENCOUNTER — Encounter: Payer: Self-pay | Admitting: Family Medicine

## 2015-07-13 ENCOUNTER — Ambulatory Visit (INDEPENDENT_AMBULATORY_CARE_PROVIDER_SITE_OTHER): Payer: 59 | Admitting: Family Medicine

## 2015-07-13 VITALS — BP 122/68 | HR 111 | Temp 97.5°F | Resp 16 | Wt 202.6 lb

## 2015-07-13 DIAGNOSIS — E1143 Type 2 diabetes mellitus with diabetic autonomic (poly)neuropathy: Secondary | ICD-10-CM | POA: Diagnosis not present

## 2015-07-13 DIAGNOSIS — E1165 Type 2 diabetes mellitus with hyperglycemia: Secondary | ICD-10-CM

## 2015-07-13 DIAGNOSIS — K5900 Constipation, unspecified: Secondary | ICD-10-CM

## 2015-07-13 DIAGNOSIS — K5909 Other constipation: Secondary | ICD-10-CM | POA: Insufficient documentation

## 2015-07-13 DIAGNOSIS — Z23 Encounter for immunization: Secondary | ICD-10-CM | POA: Diagnosis not present

## 2015-07-13 DIAGNOSIS — IMO0002 Reserved for concepts with insufficient information to code with codable children: Secondary | ICD-10-CM

## 2015-07-13 HISTORY — DX: Reserved for concepts with insufficient information to code with codable children: IMO0002

## 2015-07-13 HISTORY — DX: Type 2 diabetes mellitus with diabetic autonomic (poly)neuropathy: E11.43

## 2015-07-13 MED ORDER — POLYETHYLENE GLYCOL 3350 17 GM/SCOOP PO POWD: 17.0000 g | Freq: Every day | ORAL | Status: DC

## 2015-07-13 NOTE — Progress Notes (Signed)
Name: Olivia Romero   MRN: [id number]    DOB: 1955-08-04   Date:07/13/2015       Progress Note  Subjective  Chief Complaint  Chief Complaint  Patient presents with  . Advice Only    FMLA paperwork    HPI  Diabetes with gastroparesis: last hgbA1C 8.0 04/2015, she has been compliant with her medications, she continues to have pain on both hands and feet, but is doing better with Gabapentin, and compression gloves.  She developed symptoms of gastroparesis one year ago. She described as indigestion, epigastric pain , associated with episodes of nausea and vomiting and inability to work. She usually misses about 3 days per month because of her symptoms. She has seen Dr. Vira Agar - GI and also states Reglan improves symptoms. She is taking Omeprazole as prescribed also.   Chronic Constipation: she has been taking Miralax about 4 times weekly with control of symptoms. No blood in stools, seldom has to strain. She has abdominal pain from gastroparesis.    Patient Active Problem List   Diagnosis Date Noted  . 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 OSA (obstructive sleep apnea) 06/25/2015  . Diabetic gastroparesis ([place]) 04/23/2015  . 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/23/2015  . Acute anxiety 04/23/2015  . Hyperlipidemia 04/23/2015  . Statin intolerance 04/23/2015  . Asthma, mild intermittent 02/01/2015  . Type 2 diabetes mellitus with diabetic nephropathy ([place]) 12/31/2014  . Esophagitis 12/31/2014  . Dysphagia 12/31/2014  . Acid reflux 12/29/2014  . Sinus tachycardia ([place]) 12/03/2014  . Bilateral carpal tunnel syndrome 12/01/2014  . DM neuropathy, type II diabetes mellitus ([place]) 12/01/2014  . Nausea 12/01/2014  . Benign neoplasm of colon 11/30/2012  . Ischemic colitis ([place]) 11/30/2012  . Diverticulosis of colon (without mention of hemorrhage) 11/30/2012  . Bright red blood per rectum 11/28/2012    Class: Acute  . Diabetes  mellitus ([place]) 11/28/2012  . Essential hypertension, benign 11/28/2012  . Malnutrition of moderate degree ([place]) 11/28/2012    Past Surgical History  Procedure Laterality Date  . Tonsillectomy    . Abdominal hysterectomy    . Colonoscopy N/A 11/30/2012    Procedure: COLONOSCOPY;  Surgeon: Irene Shipper, MD;  Location: [facility];  Service: Endoscopy;  Laterality: N/A;  . Cesarean section  1986  . Esophagogastroduodenoscopy (egd) with propofol N/A 01/14/2015    Procedure: ESOPHAGOGASTRODUODENOSCOPY (EGD) WITH PROPOFOL;  Surgeon: Manya Silvas, MD;  Location: [facility];  Service: Endoscopy;  Laterality: N/A;  . Savory dilation N/A 01/14/2015    Procedure: SAVORY DILATION;  Surgeon: Manya Silvas, MD;  Location: [facility] ENDOSCOPY;  Service: Endoscopy;  Laterality: N/A;    Family History  Problem Relation Age of Onset  . Lung cancer Father   . Hypertension Father   . Arthritis Father   . Other Mother     hardening of the arteries/renal cell carcenoma  . Hypertension Mother   . Stroke Mother   . Arthritis Brother   . Rheum arthritis Maternal Uncle     Social History   Social History  . Marital Status: Single    Spouse Name: N/A  . Number of Children: 1  . 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 Smoking status: Never Smoker   . Smokeless tobacco: Never Used  . Alcohol Use: 0.0 oz/week    0 Standard drinks or equivalent per week  Comment: wine every 6-8 months  . Drug Use: No  . Sexual Activity: No   Other Topics Concern  . Not on file   Social History Narrative     Current outpatient prescriptions:  .  acetaminophen (TYLENOL) 325 MG tablet, Take 2 tablets (650 mg total) by mouth every 6 (six) hours as needed for pain or fever (Mild pain)., Disp: , Rfl:  .  albuterol (PROVENTIL HFA;VENTOLIN HFA) 108 (90 BASE) MCG/ACT inhaler, Inhale 2 puffs into the lungs every 6 (six) hours as needed for  wheezing or shortness of breath., Disp: 1 Inhaler, Rfl: 3 .  ALPRAZolam (XANAX) 0.5 MG tablet, Take 1 tablet (0.5 mg total) by mouth 2 (two) times daily as needed for anxiety., Disp: 30 tablet, Rfl: 2 .  aspirin 325 MG tablet, Take 325 mg by mouth daily., Disp: , Rfl:  .  beclomethasone (QVAR) 80 MCG/ACT inhaler, Inhale 2 puffs into the lungs daily at 3 pm., Disp: 1 Inhaler, Rfl: 12 .  Exenatide ER (BYDUREON) 2 MG PEN, Inject 1 pen into the skin every Monday., Disp: 1 each, Rfl: 3 .  ezetimibe (ZETIA) 10 MG tablet, Take 1 tablet (10 mg total) by mouth daily., Disp: 90 tablet, Rfl: 1 .  gabapentin (NEURONTIN) 300 MG capsule, Take 1 capsule (300 mg total) by mouth 3 (three) times daily. (Patient taking differently: Take 400 mg by mouth 4 (four) times daily. ), Disp: 270 capsule, Rfl: 1 .  glimepiride (AMARYL) 2 MG tablet, Take 2 mg by mouth daily with breakfast., Disp: , Rfl:  .  HYDROcodone-acetaminophen (NORCO/VICODIN) 5-325 MG per tablet, Take 1 tablet by mouth every 8 (eight) hours as needed., Disp: 15 tablet, Rfl: 0 .  Icosapent Ethyl (VASCEPA) 1 G CAPS, Take 1,000 mg by mouth 3 (three) times daily., Disp: , Rfl:  .  lisinopril (PRINIVIL,ZESTRIL) 10 MG tablet, Take 10 mg by mouth daily., Disp: , Rfl:  .  meloxicam (MOBIC) 15 MG tablet, Take 1 tablet (15 mg total) by mouth daily., Disp: 90 tablet, Rfl: 1 .  metFORMIN (GLUCOPHAGE) 500 MG tablet, Take 2 tablets (1,000 mg total) by mouth 2 (two) times daily with a meal., Disp: 120 tablet, Rfl: 3 .  metoprolol succinate (TOPROL-XL) 25 MG 24 hr tablet, Take 25 mg by mouth daily., Disp: , Rfl:  .  Multiple Vitamin (MULTIVITAMIN WITH MINERALS) TABS, Take 1 tablet by mouth daily., Disp: , Rfl:  .  omega-3 acid ethyl esters (LOVAZA) 1 G capsule, Take 2 g by mouth 2 (two) times daily., Disp: , Rfl:  .  omeprazole (PRILOSEC) 20 MG capsule, Take 20 mg by mouth daily., Disp: , Rfl:  .  ondansetron (ZOFRAN) 4 MG tablet, Take 1 tablet (4 mg total) by mouth every  8 (eight) hours as needed for nausea or vomiting., Disp: 20 tablet, Rfl: 0 .  torsemide (DEMADEX) 20 MG tablet, Take 1 tablet (20 mg total) by mouth every other day., Disp: 15 tablet, Rfl: 2  Current facility-administered medications:  .  Exenatide ER PEN 1 pen, 1 pen, Subcutaneous, Once, Ashok Norris, MD  Allergies  Allergen Reactions  . Atorvastatin     Unknown bothers liver  . Eggs Or Egg-Derived Products Other (See Comments)    Angioedema  . Flu Virus Vaccine   . Latex Itching  . Pravastatin Itching and Rash     ROS  Constitutional: Negative for fever or weight change.  Respiratory: Negative for cough and shortness of breath.   Cardiovascular: Negative for  chest pain or palpitations.  Gastrointestinal: positive for abdominal pain, no bowel changes ( doing well on Miralax ).  Musculoskeletal: Negative for gait problem or joint swelling.  Skin: Negative for rash.  Neurological: Negative for dizziness or headache.  No other specific complaints in a complete review of systems (except as listed in HPI above).  Objective  Filed Vitals:   07/13/15 1130  BP: 122/68  Pulse: 111  Temp: 97.5 F (36.4 C)  TempSrc: Oral  Resp: 16  Weight: 202 lb 9.6 oz (91.899 kg)  SpO2: 97%    Body mass index is 39.57 kg/(m^2).  Physical Exam  Constitutional: Patient appears well-developed and well-nourished. Obese  No distress.  HEENT: head atraumatic, normocephalic, pupils equal and reactive to lightneck supple, throat within normal limits Cardiovascular: Normal rate, regular rhythm and normal heart sounds.  No murmur heard. No BLE edema. Pulmonary/Chest: Effort normal and breath sounds normal. No respiratory distress. Abdominal: Soft.  Positive for epigastric tenderness. Psychiatric: Patient has a normal mood and affect. behavior is normal. Judgment and thought content normal. Muscular Skeletal: pain during palpation of lumbar spine with decrease in rom  Recent Results (from the  past 2160 hour(s))  POCT HgB A1C     Status: None   Collection Time: 04/23/15  7:43 AM  Result Value Ref Range   Hemoglobin A1C 8.0   POCT Glucose (CBG)     Status: Abnormal   Collection Time: 04/23/15  7:43 AM  Result Value Ref Range   POC Glucose 152 (A) 70 - 99 mg/dl     PHQ2/9: Depression screen [facility] 2/9 07/13/2015 06/24/2015 04/23/2015 03/17/2015 02/01/2015  Decreased Interest 0 0 0 0 0  Down, Depressed, Hopeless 0 0 0 0 0  PHQ - 2 Score 0 0 0 0 0     Fall Risk: Fall Risk  07/13/2015 06/24/2015 03/17/2015 02/01/2015 12/31/2014  Falls in the past year? No No No No No      Functional Status Survey: Is the patient deaf or have difficulty hearing?: No Does the patient have difficulty seeing, even when wearing glasses/contacts?: No Does the patient have difficulty concentrating, remembering, or making decisions?: No Does the patient have difficulty walking or climbing stairs?: No Does the patient have difficulty dressing or bathing?: No Does the patient have difficulty doing errands alone such as visiting a doctor's office or shopping?: No    Assessment & Plan  1. 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 Chronic constipation  Doing well at this time, she would like rx for Miralax - polyethylene glycol powder (GLYCOLAX/MIRALAX) powder; Take 17 g by mouth daily.  Dispense: 3350 g; Refill: 5

## 2015-07-15 ENCOUNTER — Ambulatory Visit: Payer: 59 | Admitting: Obstetrics and Gynecology

## 2015-07-21 ENCOUNTER — Encounter: Payer: Self-pay | Admitting: Obstetrics and Gynecology

## 2015-07-21 ENCOUNTER — Ambulatory Visit (INDEPENDENT_AMBULATORY_CARE_PROVIDER_SITE_OTHER): Payer: 59 | Admitting: Obstetrics and Gynecology

## 2015-07-21 ENCOUNTER — Ambulatory Visit: Admission: RE | Admit: 2015-07-21 | Payer: 59 | Source: Ambulatory Visit | Admitting: Urology

## 2015-07-21 DIAGNOSIS — N200 Calculus of kidney: Secondary | ICD-10-CM | POA: Diagnosis not present

## 2015-07-21 NOTE — Progress Notes (Signed)
07/21/2015 3:15 PM   Olivia Romero 06/06/1955 [id number]  Referring provider: Ashok Norris, MD [address] [place], [place] [address]  No chief complaint on file.   HPI: Patient is a 60 year old female with a history of right flank pain as well as a nonobstructing 3 mm left renal stone seen on a CT scan 1 year ago. She has no other previous history of renal stones. She denies any urinary symptoms. She has not experienced any gross hematuria. Has not noted the previously identified stones passage within the last year. She does states that a few months ago she experienced some right flank pain for a few hours one night but that it resolved on its own and she is not experiencing any pain since. She does have a history of chronic lower back pain.   She presents today for a yearly surveillance KUB and recheck.  PMH: Past Medical History  Diagnosis Date  . Hemorrhoid     internal  . Neuropathy ([place])   . Diabetes mellitus without complication ([place])   . Hyperlipemia   . Stroke-like episode ([place]) 2009    TIA  . Diverticulosis of colon   . Esophagitis   . Colon polyps     adenomatous  . Hypertension   . Carpal tunnel syndrome on right   . Asthma     Surgical History: Past Surgical History  Procedure Laterality Date  . Tonsillectomy    . Abdominal hysterectomy    . Colonoscopy N/A 11/30/2012    Procedure: COLONOSCOPY;  Surgeon: Irene Shipper, MD;  Location: [facility];  Service: Endoscopy;  Laterality: N/A;  . Cesarean section  1986  . Esophagogastroduodenoscopy (egd) with propofol N/A 01/14/2015    Procedure: ESOPHAGOGASTRODUODENOSCOPY (EGD) WITH PROPOFOL;  Surgeon: Manya Silvas, MD;  Location: [facility];  Service: Endoscopy;  Laterality: N/A;  . Savory dilation N/A 01/14/2015    Procedure: SAVORY DILATION;  Surgeon: Manya Silvas, MD;  Location: [facility] ENDOSCOPY;  Service: Endoscopy;  Laterality: N/A;    Home Medications:    Medication List        This list is accurate as of: 07/21/15  3:15 PM.  Always use your most recent med list.               acetaminophen 325 MG tablet  Commonly known as:  TYLENOL  Take 2 tablets (650 mg total) by mouth every 6 (six) hours as needed for pain or fever (Mild pain).     albuterol 108 (90 Base) MCG/ACT inhaler  Commonly known as:  PROVENTIL HFA;VENTOLIN HFA  Inhale 2 puffs into the lungs every 6 (six) hours as needed for wheezing or shortness of breath.     ALPRAZolam 0.5 MG tablet  Commonly known as:  XANAX  Take 1 tablet (0.5 mg total) by mouth 2 (two) times daily as needed for anxiety.     aspirin 325 MG tablet  Take 325 mg by mouth daily.     beclomethasone 80 MCG/ACT inhaler  Commonly known as:  QVAR  Inhale 2 puffs into the lungs daily at 3 pm.     Exenatide ER 2 MG Pen  Commonly known as:  BYDUREON  Inject 1 pen into the skin every Monday.     ezetimibe 10 MG tablet  Commonly known as:  ZETIA  Take 1 tablet (10 mg total) by mouth daily.     gabapentin 300 MG capsule  Commonly known as:  NEURONTIN  Take 1 capsule (  300 mg total) by mouth 3 (three) times daily.     glimepiride 2 MG tablet  Commonly known as:  AMARYL  Take 2 mg by mouth daily with breakfast.     HYDROcodone-acetaminophen 5-325 MG tablet  Commonly known as:  NORCO/VICODIN  Take 1 tablet by mouth every 8 (eight) hours as needed.     lisinopril 10 MG tablet  Commonly known as:  PRINIVIL,ZESTRIL  Take 10 mg by mouth daily.     meloxicam 15 MG tablet  Commonly known as:  MOBIC  Take 1 tablet (15 mg total) by mouth daily.     metFORMIN 500 MG tablet  Commonly known as:  GLUCOPHAGE  Take 2 tablets (1,000 mg total) by mouth 2 (two) times daily with a meal.     metoprolol succinate 25 MG 24 hr tablet  Commonly known as:  TOPROL-XL  Take 25 mg by mouth daily. Reported on 07/21/2015     multivitamin with minerals Tabs tablet  Take 1 tablet by mouth daily. Reported on 07/21/2015     omega-3 acid ethyl  esters 1 g capsule  Commonly known as:  LOVAZA  Take 2 g by mouth 2 (two) times daily.     omeprazole 20 MG capsule  Commonly known as:  PRILOSEC  Take 20 mg by mouth daily.     ondansetron 4 MG tablet  Commonly known as:  ZOFRAN  Take 1 tablet (4 mg total) by mouth every 8 (eight) hours as needed for nausea or vomiting.     polyethylene glycol powder powder  Commonly known as:  GLYCOLAX/MIRALAX  Take 17 g by mouth daily.     torsemide 20 MG tablet  Commonly known as:  DEMADEX  Take 1 tablet (20 mg total) by mouth every other day.     VASCEPA 1 g Caps  Generic drug:  Icosapent Ethyl  Take 1,000 mg by mouth 3 (three) times daily.        Allergies:  Allergies  Allergen Reactions  . Atorvastatin     Unknown bothers liver  . Eggs Or Egg-Derived Products Other (See Comments)    Angioedema  . Flu Virus Vaccine   . Latex Itching  . Pravastatin Itching and Rash    Family History: Family History  Problem Relation Age of Onset  . Lung cancer Father   . Hypertension Father   . Arthritis Father   . Other Mother     hardening of the arteries/renal cell carcenoma  . Hypertension Mother   . Stroke Mother   . Arthritis Brother   . Rheum arthritis Maternal Uncle     Social History:  reports that she has never smoked. She has never used smokeless tobacco. She reports that she drinks alcohol. She reports that she does not use illicit drugs.  ROS: UROLOGY Frequent Urination?: No Hard to postpone urination?: No Burning/pain with urination?: No Get up at night to urinate?: No Leakage of urine?: No Urine stream starts and stops?: No Trouble starting stream?: No Do you have to strain to urinate?: No Blood in urine?: No Urinary tract infection?: No Sexually transmitted disease?: No Injury to kidneys or bladder?: No Painful intercourse?: No Weak stream?: No Currently pregnant?: No Vaginal bleeding?: No Last menstrual period?: n  Gastrointestinal Nausea?:  No Vomiting?: No Indigestion/heartburn?: No Diarrhea?: Yes Constipation?: No  Constitutional Fever: No Night sweats?: Yes Weight loss?: No Fatigue?: No  Skin Skin rash/lesions?: No Itching?: No  Eyes Blurred vision?: No Double vision?: No  Ears/Nose/Throat Sore throat?: No Sinus problems?: No  Hematologic/Lymphatic Swollen glands?: No Easy bruising?: No  Cardiovascular Leg swelling?: No Chest pain?: No  Respiratory Cough?: No Shortness of breath?: No  Endocrine Excessive thirst?: No  Musculoskeletal Back pain?: No Joint pain?: No  Neurological Headaches?: No Dizziness?: No  Psychologic Depression?: No Anxiety?: No  Physical Exam: BP 138/83 mmHg  Pulse 105  Resp 16  Ht 5' (1.524 m)  Wt 199 lb 14.4 oz (90.674 kg)  BMI 39.04 kg/m2  Constitutional:  Alert and oriented, No acute distress. HEENT: [place] AT, moist mucus membranes.  Trachea midline, no masses. Cardiovascular: No clubbing, cyanosis, or edema. Respiratory: Normal respiratory effort, no increased work of breathing. Skin: No rashes, bruises or suspicious lesions. Neurologic: Grossly intact, no focal deficits, moving all 4 extremities. Psychiatric: Normal mood and affect.  Laboratory Data:  Lab Results  Component Value Date   WBC 9.5 12/26/2014   HGB 13.4 12/26/2014   HCT 39.8 12/26/2014   MCV 83.1 12/26/2014   PLT 244 12/26/2014    Lab Results  Component Value Date   CREATININE 0.25* 02/22/2015    No results found for: PSA  No results found for: TESTOSTERONE  Lab Results  Component Value Date   HGBA1C 8.0 04/23/2015    Urinalysis No results found for: COLORURINE, APPEARANCEUR, LABSPEC, PHURINE, GLUCOSEU, HGBUR, BILIRUBINUR, KETONESUR, PROTEINUR, UROBILINOGEN, NITRITE, LEUKOCYTESUR  Pertinent Imaging:   Assessment & Plan:    Nephrolithiasis-Previously identified 3 mm left renal stone not identified on most recent KUB. She remains asymptomatic. Patient may have passed  the stone or it may be too small to be seen KUB. Patient states that she would like to follow up with her primary care provider and with [place] as needed.  There are no diagnoses linked to this encounter.  Return if symptoms worsen or fail to improve.  These notes generated with voice recognition software. I apologize for typographical errors.  Herbert Moors, [facility] [address] [place], [place] [address] [phone]

## 2015-07-22 ENCOUNTER — Ambulatory Visit: Payer: 59 | Admitting: Family Medicine

## 2015-07-27 ENCOUNTER — Encounter: Payer: Self-pay | Admitting: Family Medicine

## 2015-07-27 ENCOUNTER — Ambulatory Visit (INDEPENDENT_AMBULATORY_CARE_PROVIDER_SITE_OTHER): Payer: 59 | Admitting: Family Medicine

## 2015-07-27 VITALS — BP 152/86 | HR 112 | Temp 98.6°F | Resp 18 | Ht 60.0 in | Wt 201.8 lb

## 2015-07-27 DIAGNOSIS — E1143 Type 2 diabetes mellitus with diabetic autonomic (poly)neuropathy: Secondary | ICD-10-CM | POA: Diagnosis not present

## 2015-07-27 DIAGNOSIS — M5431 Sciatica, right side: Secondary | ICD-10-CM | POA: Diagnosis not present

## 2015-07-27 DIAGNOSIS — I10 Essential (primary) hypertension: Secondary | ICD-10-CM | POA: Diagnosis not present

## 2015-07-27 DIAGNOSIS — E1165 Type 2 diabetes mellitus with hyperglycemia: Secondary | ICD-10-CM

## 2015-07-27 DIAGNOSIS — IMO0002 Reserved for concepts with insufficient information to code with codable children: Secondary | ICD-10-CM

## 2015-07-27 MED ORDER — CYCLOBENZAPRINE HCL 10 MG PO TABS: 10.0000 mg | ORAL_TABLET | Freq: Three times a day (TID) | ORAL | Status: DC | PRN

## 2015-07-27 MED ORDER — HYDROCODONE-ACETAMINOPHEN 5-325 MG PO TABS: 1.0000 | ORAL_TABLET | Freq: Three times a day (TID) | ORAL | Status: DC | PRN

## 2015-07-27 MED ORDER — PREDNISONE 10 MG (48) PO TBPK: ORAL_TABLET | Freq: Every day | ORAL | Status: DC

## 2015-07-27 NOTE — Patient Instructions (Signed)
Radicular Pain °Radicular pain in either the arm or leg is usually from a bulging or herniated disk in the spine. A piece of the herniated disk may press against the nerves as the nerves exit the spine. This causes pain which is felt at the tips of the nerves down the arm or leg. Other causes of radicular pain may include: °· Fractures. °· Heart disease. °· Cancer. °· An abnormal and usually degenerative state of the nervous system or nerves (neuropathy). °Diagnosis may require CT or MRI scanning to determine the primary cause.  °Nerves that start at the neck (nerve roots) may cause radicular pain in the outer shoulder and arm. It can spread down to the thumb and fingers. The symptoms vary depending on which nerve root has been affected. In most cases radicular pain improves with conservative treatment. Neck problems may require physical therapy, a neck collar, or cervical traction. Treatment may take many weeks, and surgery may be considered if the symptoms do not improve.  °Conservative treatment is also recommended for sciatica. Sciatica causes pain to radiate from the lower back or buttock area down the leg into the foot. Often there is a history of back problems. Most patients with sciatica are better after 2 to 4 weeks of rest and other supportive care. Short term bed rest can reduce the disk pressure considerably. Sitting, however, is not a good position since this increases the pressure on the disk. You should avoid bending, lifting, and all other activities which make the problem worse. Traction can be used in severe cases. Surgery is usually reserved for patients who do not improve within the first months of treatment. °Only take over-the-counter or prescription medicines for pain, discomfort, or fever as directed by your caregiver. Narcotics and muscle relaxants may help by relieving more severe pain and spasm and by providing mild sedation. Cold or massage can give significant relief. Spinal manipulation  is not recommended. It can increase the degree of disc protrusion. Epidural steroid injections are often effective treatment for radicular pain. These injections deliver medicine to the spinal nerve in the space between the protective covering of the spinal cord and back bones (vertebrae). Your caregiver can give you more information about steroid injections. These injections are most effective when given within two weeks of the onset of pain.  °You should see your caregiver for follow up care as recommended. A program for neck and back injury rehabilitation with stretching and strengthening exercises is an important part of management.  °SEEK IMMEDIATE MEDICAL CARE IF: °· You develop increased pain, weakness, or numbness in your arm or leg. °· You develop difficulty with bladder or bowel control. °· You develop abdominal pain. °  °This information is not intended to replace advice given to you by your health care provider. Make sure you discuss any questions you have with your health care provider. °  °Document Released: 06/29/2004 Document Revised: 06/12/2014 Document Reviewed: 12/16/2014 °Elsevier Interactive Patient Education ©2016 [employer] ° °

## 2015-07-27 NOTE — Progress Notes (Signed)
Name: Olivia Romero   MRN: [id number]    DOB: [birth year]   Date:07/27/2015       Progress Note  Subjective  Chief Complaint  Chief Complaint  Patient presents with  . Back Pain    patient has had a flare up. she has tried the exericses for Sciatica, ice and heat, flexril and meloxicam.  . Sciatica    HPI  Sciatic right side: she was playing in the snow about one month ago with her grandson and developed mild pain on her lower back afterwards, however over the past couple of weeks the pain has been intense, right now a 7/10 , almost went to [facility] last night. Pain is aching and times with spasms. Worse when staying still for a prolonged periods of time and radiating down right posterior thigh - and stops around popliteal fossa. Denies bladder incontinence, she had one episode of bowel incontinence ( 5 days ago ) - she had severe urgency and started to run to the bathroom but she could not get there in time.  She has DM and glucose is around 140's, bp is usually at goal, but she is severe pain and bp is high today.    Patient Active Problem List   Diagnosis Date Noted  . 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 Chronic constipation 07/13/2015  . OSA (obstructive sleep apnea) 06/25/2015  . Diabetic gastroparesis ([place]) 04/23/2015  . 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/23/2015  . Acute anxiety 04/23/2015  . Hyperlipidemia 04/23/2015  . Statin intolerance 04/23/2015  . Asthma, mild intermittent 02/01/2015  . Type 2 diabetes mellitus with diabetic nephropathy ([place]) 12/31/2014  . Esophagitis 12/31/2014  . Dysphagia 12/31/2014  . Acid reflux 12/29/2014  . Sinus tachycardia ([place]) 12/03/2014  . Bilateral carpal tunnel syndrome 12/01/2014  . DM neuropathy, type II diabetes mellitus ([place]) 12/01/2014  . Nausea 12/01/2014  . Benign neoplasm of colon 11/30/2012  . Ischemic colitis ([place]) 11/30/2012  . Diverticulosis of colon (without mention of  hemorrhage) 11/30/2012  . Bright red blood per rectum 11/28/2012    Class: Acute  . Essential hypertension, benign 11/28/2012  . Malnutrition of moderate degree ([place]) 11/28/2012    Past Surgical History  Procedure Laterality Date  . Tonsillectomy    . Abdominal hysterectomy    . Colonoscopy N/A 11/30/2012    Procedure: COLONOSCOPY;  Surgeon: Irene Shipper, MD;  Location: [facility] ENDOSCOPY;  Service: Endoscopy;  Laterality: N/A;  . Cesarean section  1986  . Esophagogastroduodenoscopy (egd) with propofol N/A 01/14/2015    Procedure: ESOPHAGOGASTRODUODENOSCOPY (EGD) WITH PROPOFOL;  Surgeon: Manya Silvas, MD;  Location: [facility];  Service: Endoscopy;  Laterality: N/A;  . Savory dilation N/A 01/14/2015    Procedure: SAVORY DILATION;  Surgeon: Manya Silvas, MD;  Location: [facility] ENDOSCOPY;  Service: Endoscopy;  Laterality: N/A;    Family History  Problem Relation Age of Onset  . Lung cancer Father   . Hypertension Father   . Arthritis Father   . Other Mother     hardening of the arteries/renal cell carcenoma  . Hypertension Mother   . Stroke Mother   . Arthritis Brother   . Rheum arthritis Maternal Uncle     Social History   Social History  . Marital Status: Single    Spouse Name: N/A  . Number of Children: 1  . Years of Education: N/A   Occupational History  . histology tech [employer]   Social History Main  Topics  . Smoking status: Never Smoker   . Smokeless tobacco: Never Used  . Alcohol Use: 0.0 oz/week    0 Standard drinks or equivalent per week     Comment: wine every 6-8 months  . Drug Use: No  . Sexual Activity: No   Other Topics Concern  . Not on file   Social History Narrative     Current outpatient prescriptions:  .  acetaminophen (TYLENOL) 325 MG tablet, Take 2 tablets (650 mg total) by mouth every 6 (six) hours as needed for pain or fever (Mild pain)., Disp: , Rfl:  .  albuterol (PROVENTIL HFA;VENTOLIN HFA) 108 (90 BASE) MCG/ACT inhaler, Inhale 2  puffs into the lungs every 6 (six) hours as needed for wheezing or shortness of breath., Disp: 1 Inhaler, Rfl: 3 .  ALPRAZolam (XANAX) 0.5 MG tablet, Take 1 tablet (0.5 mg total) by mouth 2 (two) times daily as needed for anxiety., Disp: 30 tablet, Rfl: 2 .  aspirin 325 MG tablet, Take 325 mg by mouth daily., Disp: , Rfl:  .  beclomethasone (QVAR) 80 MCG/ACT inhaler, Inhale 2 puffs into the lungs daily at 3 pm., Disp: 1 Inhaler, Rfl: 12 .  Exenatide ER (BYDUREON) 2 MG PEN, Inject 1 pen into the skin every Monday., Disp: 1 each, Rfl: 3 .  ezetimibe (ZETIA) 10 MG tablet, Take 1 tablet (10 mg total) by mouth daily., Disp: 90 tablet, Rfl: 1 .  glimepiride (AMARYL) 2 MG tablet, Take 2 mg by mouth daily with breakfast., Disp: , Rfl:  .  HYDROcodone-acetaminophen (NORCO/VICODIN) 5-325 MG tablet, Take 1 tablet by mouth every 8 (eight) hours as needed., Disp: 20 tablet, Rfl: 0 .  Icosapent Ethyl (VASCEPA) 1 G CAPS, Take 1,000 mg by mouth 3 (three) times daily., Disp: , Rfl:  .  lisinopril (PRINIVIL,ZESTRIL) 10 MG tablet, Take 10 mg by mouth daily., Disp: , Rfl:  .  meloxicam (MOBIC) 15 MG tablet, Take 1 tablet (15 mg total) by mouth daily., Disp: 90 tablet, Rfl: 1 .  metFORMIN (GLUCOPHAGE) 500 MG tablet, Take 2 tablets (1,000 mg total) by mouth 2 (two) times daily with a meal., Disp: 120 tablet, Rfl: 3 .  metoprolol succinate (TOPROL-XL) 25 MG 24 hr tablet, Take 25 mg by mouth daily. Reported on 07/21/2015, Disp: , Rfl:  .  Multiple Vitamin (MULTIVITAMIN WITH MINERALS) TABS, Take 1 tablet by mouth daily. Reported on 07/21/2015, Disp: , Rfl:  .  omega-3 acid ethyl esters (LOVAZA) 1 G capsule, Take 2 g by mouth 2 (two) times daily., Disp: , Rfl:  .  omeprazole (PRILOSEC) 20 MG capsule, Take 20 mg by mouth daily., Disp: , Rfl:  .  ondansetron (ZOFRAN) 4 MG tablet, Take 1 tablet (4 mg total) by mouth every 8 (eight) hours as needed for nausea or vomiting., Disp: 20 tablet, Rfl: 0 .  polyethylene glycol powder  (GLYCOLAX/MIRALAX) powder, Take 17 g by mouth daily., Disp: 3350 g, Rfl: 5 .  torsemide (DEMADEX) 20 MG tablet, Take 1 tablet (20 mg total) by mouth every other day., Disp: 15 tablet, Rfl: 2 .  cyclobenzaprine (FLEXERIL) 10 MG tablet, Take 1 tablet (10 mg total) by mouth 3 (three) times daily as needed for muscle spasms., Disp: 90 tablet, Rfl: 0 .  gabapentin (NEURONTIN) 300 MG capsule, Take 1 capsule (300 mg total) by mouth 3 (three) times daily. (Patient taking differently: Take 400 mg by mouth 4 (four) times daily. ), Disp: 270 capsule, Rfl: 1 .  predniSONE (STERAPRED  UNI-PAK 48 TAB) 10 MG (48) TBPK tablet, Take by mouth daily. Take as directed, Disp: 48 tablet, Rfl: 0  Current facility-administered medications:  .  Exenatide ER PEN 1 pen, 1 pen, Subcutaneous, Once, Ashok Norris, MD  Allergies  Allergen Reactions  . Atorvastatin     Unknown bothers liver  . Eggs Or Egg-Derived Products Other (See Comments)    Angioedema  . Flu Virus Vaccine   . Latex Itching  . Pravastatin Itching and Rash     ROS  Ten systems reviewed and is negative except as mentioned in HPI  Objective  Filed Vitals:   07/27/15 0924  BP: 152/86  Pulse: 112  Temp: 98.6 F (37 C)  TempSrc: Oral  Resp: 18  Height: 5' (1.524 m)  Weight: 201 lb 12.8 oz (91.536 kg)  SpO2: 98%    Body mass index is 39.41 kg/(m^2).  Physical Exam  Constitutional: Patient appears well-developed and well-nourished. Obese  No distress.  HEENT: head atraumatic, normocephalic, pupils equal and reactive to light,  neck supple, throat within normal limits Cardiovascular: Normal rate, regular rhythm and normal heart sounds.  No murmur heard. No BLE edema. Pulmonary/Chest: Effort normal and breath sounds normal. No respiratory distress. Abdominal: Soft.  There is no tenderness. Psychiatric: Patient has a normal mood and affect. behavior is normal. Judgment and thought content normal. Muscular Skeletal: pain during palpation  for lower back bilaterally, very tender over right sciatic notch, negative straight leg raise, sensation on both legs stable - some neuropathy of lower legs.   PHQ2/9: Depression screen [facility] 2/9 07/27/2015 07/13/2015 06/24/2015 04/23/2015 03/17/2015  Decreased Interest 0 0 0 0 0  Down, Depressed, Hopeless 0 0 0 0 0  PHQ - 2 Score 0 0 0 0 0     Fall Risk: Fall Risk  07/27/2015 07/13/2015 06/24/2015 03/17/2015 02/01/2015  Falls in the past year? No No No No No     Functional Status Survey: Is the patient deaf or have difficulty hearing?: No Does the patient have difficulty seeing, even when wearing glasses/contacts?: No Does the patient have difficulty concentrating, remembering, or making decisions?: No Does the patient have difficulty walking or climbing stairs?: No Does the patient have difficulty dressing or bathing?: No Does the patient have difficulty doing errands alone such as visiting a doctor's office or shopping?: No    Assessment & Plan  1. Sciatica, right  She was advised to hold Meloxicam while taking prednisone and also to take last dose no later than 6 pm. Do not drive while taking medications that cause sedation . Excuse for work until Friday - predniSONE (STERAPRED UNI-PAK 48 TAB) 10 MG (48) TBPK tablet; Take by mouth daily. Take as directed  Dispense: 48 tablet; Refill: 0 - HYDROcodone-acetaminophen (NORCO/VICODIN) 5-325 MG tablet; Take 1 tablet by mouth every 8 (eight) hours as needed.  Dispense: 20 tablet; Refill: 0 - cyclobenzaprine (FLEXERIL) 10 MG tablet; Take 1 tablet (10 mg total) by mouth 3 (three) times daily as needed for muscle spasms.  Dispense: 90 tablet; Refill: 0  2. Essential hypertension, benign  Elevated secondary to pain, we will start hydrocodone, monitor bp at home  3. 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

## 2015-08-04 ENCOUNTER — Ambulatory Visit: Payer: 59 | Admitting: Family Medicine

## 2015-08-06 ENCOUNTER — Telehealth: Payer: Self-pay | Admitting: Family Medicine

## 2015-08-06 ENCOUNTER — Telehealth: Payer: Self-pay

## 2015-08-06 DIAGNOSIS — M5431 Sciatica, right side: Secondary | ICD-10-CM

## 2015-08-06 NOTE — Telephone Encounter (Signed)
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 Patient is still in a lot of pain and would like to know what to do. Is it possible for her to come in today or do a referral.  Steele Sizer, MD at 08/06/2015 12:33 PM  I will order MRI of lumbar spine before making a referral    Patient was informed of Dr. Ancil Boozer' message and asked if we could get it scheduled as soon as possible.

## 2015-08-06 NOTE — Telephone Encounter (Signed)
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 Patient is still in a lot of pain and would like to know what to do. Is it possible for her to come in today or do a referral.

## 2015-08-06 NOTE — Telephone Encounter (Signed)
I will order MRI of lumbar spine before making a referral

## 2015-08-09 ENCOUNTER — Telehealth: Payer: Self-pay

## 2015-08-09 ENCOUNTER — Telehealth: Payer: Self-pay | Admitting: Family Medicine

## 2015-08-09 NOTE — Telephone Encounter (Signed)
Patient was informed on the messages below and will give scheduling a call.

## 2015-08-09 NOTE — Telephone Encounter (Signed)
Pt is scheduled for her MRI on 08/25/15. She was told it was going to be high priority scheduling, she is wanting to have this done sooner. The MRI was set at routine, not urgent. If you feel like it is needing to be scheduled as high priority, please change it to urgent. Please call the pt as she is in a lot of the pain. Thanks

## 2015-08-09 NOTE — Telephone Encounter (Signed)
Can she been placed on cancellation list? She does not have a diagnosis to support urgent testing

## 2015-08-09 NOTE — Telephone Encounter (Signed)
There is not a cancellation list but the scheduling dept stated the patient can call every day after 2 p.m. To see if someone cancelled or if they can be moved up. I gave the patient the phone number to call and instructions on calling every day after 2 p.m. At [phone]

## 2015-08-09 NOTE — Telephone Encounter (Signed)
Kylie from [facility] verification requesting return call. States patient coming in on 08-10-15 for MRI and she is needing any pre-cert from [facility]

## 2015-08-10 ENCOUNTER — Ambulatory Visit (HOSPITAL_COMMUNITY)
Admission: RE | Admit: 2015-08-10 | Discharge: 2015-08-10 | Disposition: A | Discharge status: Home / Self Care | Payer: 59 | Source: Ambulatory Visit | Attending: Family Medicine | Admitting: Family Medicine

## 2015-08-10 DIAGNOSIS — M5431 Sciatica, right side: Secondary | ICD-10-CM | POA: Insufficient documentation

## 2015-08-10 DIAGNOSIS — M4805 Spinal stenosis, thoracolumbar region: Secondary | ICD-10-CM | POA: Diagnosis not present

## 2015-08-10 DIAGNOSIS — M4806 Spinal stenosis, lumbar region: Secondary | ICD-10-CM | POA: Diagnosis not present

## 2015-08-10 NOTE — Telephone Encounter (Signed)
MRI has been approved.

## 2015-08-11 ENCOUNTER — Other Ambulatory Visit: Payer: Self-pay | Admitting: Family Medicine

## 2015-08-11 DIAGNOSIS — M4800 Spinal stenosis, site unspecified: Secondary | ICD-10-CM | POA: Insufficient documentation

## 2015-08-11 DIAGNOSIS — M4805 Spinal stenosis, thoracolumbar region: Secondary | ICD-10-CM

## 2015-08-12 ENCOUNTER — Telehealth: Payer: Self-pay | Admitting: Family Medicine

## 2015-08-12 NOTE — Telephone Encounter (Signed)
Pt would like a call back about her MRI results. °

## 2015-08-13 ENCOUNTER — Telehealth: Payer: Self-pay

## 2015-08-13 NOTE — Telephone Encounter (Signed)
Patient called and stated she has already used up the 3 hours that Dr. Ancil Boozer gave her for her FMLA paper work. Patient wanted to know if there was anyway you can increased the time off due to her excruciating back pain to 6-7 hours and initial next to the time. Also could we add her newly diagnosis of Spinal Stenosis from her MRI findings, and patient states she would come in if you need her too. It just needs to be turned in by  March 17 th, but her back is giving so much pain the medication you gave her just takes the edge off. Her referral has been put in and the patient is awaiting her appointment for [facility] neurosurgery.

## 2015-08-13 NOTE — Telephone Encounter (Signed)
Needs follow-up

## 2015-08-16 ENCOUNTER — Ambulatory Visit: Payer: 59 | Admitting: Family Medicine

## 2015-08-17 ENCOUNTER — Other Ambulatory Visit: Payer: Self-pay | Admitting: Neurosurgery

## 2015-08-18 ENCOUNTER — Telehealth: Payer: Self-pay | Admitting: Family Medicine

## 2015-08-18 ENCOUNTER — Ambulatory Visit: Payer: 59 | Admitting: Family Medicine

## 2015-08-18 NOTE — Telephone Encounter (Signed)
PT HAS APPT IN April AND NEEDS TO TALK WITH YOU ABOUT THAT AND HER DIAB

## 2015-08-18 NOTE — Telephone Encounter (Signed)
Spoke with pt and informed her that if she needed anything before Rutherford Nail comes back to give [place] a call

## 2015-08-25 ENCOUNTER — Telehealth: Payer: Self-pay | Admitting: Family Medicine

## 2015-08-25 ENCOUNTER — Ambulatory Visit: Payer: 59

## 2015-08-25 NOTE — Telephone Encounter (Signed)
Patient is requesting refill on Lisinopril 40mg . Please send to [place] she is completely out

## 2015-08-26 ENCOUNTER — Telehealth: Payer: Self-pay | Admitting: Family Medicine

## 2015-08-26 NOTE — Telephone Encounter (Signed)
Olivia Romero with [place] requesting refill on Lisinopril. A prescription was sent to mail order pharmacy however she has not receive the prescription yet. She is now asking her local  pharmacy to refill [phone]

## 2015-08-27 ENCOUNTER — Encounter (INDEPENDENT_AMBULATORY_CARE_PROVIDER_SITE_OTHER): Payer: Self-pay

## 2015-08-27 ENCOUNTER — Encounter (HOSPITAL_COMMUNITY): Payer: Self-pay

## 2015-08-27 ENCOUNTER — Encounter (HOSPITAL_COMMUNITY)
Admission: RE | Admit: 2015-08-27 | Discharge: 2015-08-27 | Disposition: A | Discharge status: Home / Self Care | Payer: 59 | Source: Ambulatory Visit | Attending: Neurosurgery | Admitting: Neurosurgery

## 2015-08-27 DIAGNOSIS — Z7984 Long term (current) use of oral hypoglycemic drugs: Secondary | ICD-10-CM | POA: Diagnosis not present

## 2015-08-27 DIAGNOSIS — Z8673 Personal history of transient ischemic attack (TIA), and cerebral infarction without residual deficits: Secondary | ICD-10-CM | POA: Diagnosis not present

## 2015-08-27 DIAGNOSIS — Z0183 Encounter for blood typing: Secondary | ICD-10-CM | POA: Insufficient documentation

## 2015-08-27 DIAGNOSIS — I10 Essential (primary) hypertension: Secondary | ICD-10-CM | POA: Insufficient documentation

## 2015-08-27 DIAGNOSIS — Z01812 Encounter for preprocedural laboratory examination: Secondary | ICD-10-CM | POA: Diagnosis not present

## 2015-08-27 DIAGNOSIS — Z01818 Encounter for other preprocedural examination: Secondary | ICD-10-CM | POA: Insufficient documentation

## 2015-08-27 DIAGNOSIS — K219 Gastro-esophageal reflux disease without esophagitis: Secondary | ICD-10-CM | POA: Insufficient documentation

## 2015-08-27 DIAGNOSIS — G4733 Obstructive sleep apnea (adult) (pediatric): Secondary | ICD-10-CM | POA: Insufficient documentation

## 2015-08-27 DIAGNOSIS — Z7982 Long term (current) use of aspirin: Secondary | ICD-10-CM | POA: Diagnosis not present

## 2015-08-27 DIAGNOSIS — M4310 Spondylolisthesis, site unspecified: Secondary | ICD-10-CM | POA: Insufficient documentation

## 2015-08-27 DIAGNOSIS — E119 Type 2 diabetes mellitus without complications: Secondary | ICD-10-CM | POA: Insufficient documentation

## 2015-08-27 DIAGNOSIS — E785 Hyperlipidemia, unspecified: Secondary | ICD-10-CM | POA: Diagnosis not present

## 2015-08-27 DIAGNOSIS — Z79899 Other long term (current) drug therapy: Secondary | ICD-10-CM | POA: Diagnosis not present

## 2015-08-27 HISTORY — DX: Nausea with vomiting, unspecified: R11.2

## 2015-08-27 HISTORY — DX: Anemia, unspecified: D64.9

## 2015-08-27 HISTORY — DX: Gastro-esophageal reflux disease without esophagitis: K21.9

## 2015-08-27 HISTORY — DX: Sleep apnea, unspecified: G47.30

## 2015-08-27 HISTORY — DX: Unspecified osteoarthritis, unspecified site: M19.90

## 2015-08-27 HISTORY — DX: Pneumonia, unspecified organism: J18.9

## 2015-08-27 HISTORY — DX: Other specified postprocedural states: Z98.890

## 2015-08-27 LAB — TYPE AND SCREEN
ABO/RH(D): O POS
ANTIBODY SCREEN: NEGATIVE

## 2015-08-27 LAB — SURGICAL PCR SCREEN
MRSA, PCR: NEGATIVE
STAPHYLOCOCCUS AUREUS: NEGATIVE

## 2015-08-27 LAB — CBC
HEMATOCRIT: 42.1 % (ref 36.0–46.0)
Hemoglobin: 14 g/dL (ref 12.0–15.0)
MCH: 27.8 pg (ref 26.0–34.0)
MCHC: 33.3 g/dL (ref 30.0–36.0)
MCV: 83.7 fL (ref 78.0–100.0)
Platelets: 312 10*3/uL (ref 150–400)
RBC: 5.03 MIL/uL (ref 3.87–5.11)
RDW: 13.2 % (ref 11.5–15.5)
WBC: 8.2 10*3/uL (ref 4.0–10.5)

## 2015-08-27 LAB — BASIC METABOLIC PANEL
Anion gap: 10 (ref 5–15)
BUN: 8 mg/dL (ref 6–20)
CALCIUM: 9.9 mg/dL (ref 8.9–10.3)
CO2: 24 mmol/L (ref 22–32)
Chloride: 102 mmol/L (ref 101–111)
Creatinine, Ser: 0.48 mg/dL (ref 0.44–1.00)
GFR calc Af Amer: >60 mL/min (ref >60)
GLUCOSE: 160 mg/dL — AB (ref 65–99)
Potassium: 4.3 mmol/L (ref 3.5–5.1)
Sodium: 136 mmol/L (ref 135–145)

## 2015-08-27 LAB — ABO/RH: ABO/RH(D): O POS

## 2015-08-27 LAB — GLUCOSE, CAPILLARY: Glucose-Capillary: 163 mg/dL — ABNORMAL HIGH (ref 65–99)

## 2015-08-27 NOTE — Progress Notes (Signed)
PCP is Dr Ashok Norris Pt states she saw Dr. Acie Fredrickson 6 months ago for a rapid heart rate. Denies having a card. Cath, or echo, states she had a stress test many years ago. Reports her fasting cbg's run 170-200's

## 2015-08-27 NOTE — Pre-Procedure Instructions (Signed)
Olivia Romero  08/27/2015      [facility] [place] - [address] [place] [address] Phone: [phone] Fax: [phone]    Your procedure is scheduled on March 30  Report to [place] at 1030 A.M.  Call this number if you have problems the morning of surgery:  [phone]   Remember:  Do not eat food or drink liquids after midnight.  Take these medicines the morning of surgery with A SIP OF WATER Tylenol if needed, Albuterol inhaler if needed- bring with you on the day of surgery, alprazolam (Xanax) if needed, gabapentin ([place]),  Cyclobenzaprine (Flexeril) if needed, Hydrocodone (Norco) if needed, Omeprazole (Prilosec)  Stop taking aspirin, BC's, Goody's, herbal medications, ibuprofen, Advil, Motrin, Aleve, Vitamins, Fish Oil, Meloxicam, Mobic   How to Manage Your Diabetes Before and After Surgery  Why is it important to control my blood sugar before and after surgery? . Improving blood sugar levels before and after surgery helps healing and can limit problems. . A way of improving blood sugar control is eating a healthy diet by: o  Eating less sugar and carbohydrates o  Increasing activity/exercise o  Talking with your doctor about reaching your blood sugar goals . High blood sugars (greater than 180 mg/dL) can raise your risk of infections and slow your recovery, so you will need to focus on controlling your diabetes during the weeks before surgery. . Make sure that the doctor who takes care of your diabetes knows about your planned surgery including the date and location.  How do I manage my blood sugar before surgery? . Check your blood sugar at least 4 times a day, starting 2 days before surgery, to make sure that the level is not too high or low. o Check your blood sugar the morning of your surgery when you wake up and every 2 hours until you get to the Short Stay unit. . If your blood  sugar is less than 70 mg/dL, you will need to treat for low blood sugar: o Do not take insulin. o Treat a low blood sugar (less than 70 mg/dL) with  cup of clear juice (cranberry or apple), 4 glucose tablets, OR glucose gel. o Recheck blood sugar in 15 minutes after treatment (to make sure it is greater than 70 mg/dL). If your blood sugar is not greater than 70 mg/dL on recheck, call [phone] for further instructions. . Report your blood sugar to the short stay nurse when you get to Short Stay.  . If you are admitted to the hospital after surgery: o Your blood sugar will be checked by the staff and you will probably be given insulin after surgery (instead of oral diabetes medicines) to make sure you have good blood sugar levels. o The goal for blood sugar control after surgery is 80-180 mg/dL.              WHAT DO I DO ABOUT MY DIABETES MEDICATION?   [name] Do not take oral diabetes medicines (pills) the morning of surgery.        . The day of surgery, do not take other diabetes injectables, including Byetta (exenatide), Bydureon (exenatide ER), Victoza (liraglutide), or Trulicity (dulaglutide).  . If your CBG is greater than 220 mg/dL, you may take  of your sliding scale (correction) dose of insulin.  Other Instructions:          Patient Signature:  Date:   Nurse Signature:  Date:   Reviewed and Endorsed by [facility] Patient Education Committee, August 2015  Do not wear jewelry, make-up or nail polish.  Do not wear lotions, powders, or perfumes.  You may wear deodorant.  Do not shave 48 hours prior to surgery.  Men may shave face and neck.  Do not bring valuables to the hospital.  [facility] is not responsible for any belongings or valuables.  Contacts, dentures or bridgework may not be worn into surgery.  Leave your suitcase in the car.  After surgery it may be brought to your room.  For patients admitted to the hospital, discharge time will be determined by  your treatment team.  Patients discharged the day of surgery will not be allowed to drive home.    Special instructions: [place] - Preparing for Surgery  Before surgery, you can play an important role.  Because skin is not sterile, your skin needs to be as free of germs as possible.  You can reduce the number of germs on you skin by washing with CHG (chlorahexidine gluconate) soap before surgery.  CHG is an antiseptic cleaner which kills germs and bonds with the skin to continue killing germs even after washing.  Please DO NOT use if you have an allergy to CHG or antibacterial soaps.  If your skin becomes reddened/irritated stop using the CHG and inform your nurse when you arrive at Short Stay.  Do not shave (including legs and underarms) for at least 48 hours prior to the first CHG shower.  You may shave your face.  Please follow these instructions carefully:   1.  Shower with CHG Soap the night before surgery and the                                morning of Surgery.  2.  If you choose to wash your hair, wash your hair first as usual with your       normal shampoo.  3.  After you shampoo, rinse your hair and body thoroughly to remove the                      Shampoo.  4.  Use CHG as you would any other liquid soap.  You can apply chg directly       to the skin and wash gently with scrungie or a clean washcloth.  5.  Apply the CHG Soap to your body ONLY FROM THE NECK DOWN.        Do not use on open wounds or open sores.  Avoid contact with your eyes,       ears, mouth and genitals (private parts).  Wash genitals (private parts)       with your normal soap.  6.  Wash thoroughly, paying special attention to the area where your surgery        will be performed.  7.  Thoroughly rinse your body with warm water from the neck down.  8.  DO NOT shower/wash with your normal soap after using and rinsing off       the CHG Soap.  9.  Pat yourself dry with a clean towel.            10.  Wear clean  pajamas.            11.  Place clean sheets on your bed the night of your first shower and  do not        sleep with pets.  Day of Surgery  Do not apply any lotions/deoderants the morning of surgery.  Please wear clean clothes to the hospital/surgery center.     Please read over the following fact sheets that you were given. Pain Booklet, Coughing and Deep Breathing, Blood Transfusion Information, MRSA Information and Surgical Site Infection Prevention

## 2015-08-28 LAB — HEMOGLOBIN A1C
HEMOGLOBIN A1C: 8.4 % — AB (ref 4.8–5.6)
Mean Plasma Glucose: 194 mg/dL

## 2015-08-30 NOTE — Progress Notes (Signed)
Anesthesia Chart Review:  Pt is a 60 year old female scheduled for L4-5 decompression with PLIF on 09/02/2015 with Dr. Christella Noa.   PCP is Dr. Ashok Norris.   PMH includes:  HTN, DM, OSA (no longer using CPAP due to weight loss), TIA, hyperlipidemia, anemia, GERD, post-op N/V. Never smoker. BMI 39  Medications include: albuterol, ASA, exenatide, icosapent ethyl, lisinopril, metformin, prilosec, torsemide  Preoperative labs reviewed.  HgbA1c 8.4, glucose 160  EKG 12/03/14: Sinus  Rhythm. 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 Incomplete RBBB.  If no changes, I anticipate pt can proceed with surgery as scheduled.   Willeen Cass, FNP-BC [facility] Short Stay Surgical Center/Anesthesiology Phone: [phone] 08/30/2015 3:19 PM

## 2015-08-31 ENCOUNTER — Other Ambulatory Visit: Payer: Self-pay

## 2015-08-31 MED ORDER — LISINOPRIL 40 MG PO TABS: 40.0000 mg | ORAL_TABLET | Freq: Every day | ORAL | Status: DC

## 2015-09-01 MED ORDER — CEFAZOLIN SODIUM-DEXTROSE 2-4 GM/100ML-% IV SOLN
2.0000 g | INTRAVENOUS | Status: AC
  Administered 2015-09-02: 2 g via INTRAVENOUS
  Filled 2015-09-01: qty 100

## 2015-09-02 ENCOUNTER — Inpatient Hospital Stay (HOSPITAL_COMMUNITY): Payer: 59

## 2015-09-02 ENCOUNTER — Inpatient Hospital Stay (HOSPITAL_COMMUNITY)
Admission: RE | Admit: 2015-09-02 | Discharge: 2015-09-07 | DRG: 460 | Disposition: A | Discharge status: Home / Self Care | Payer: 59 | Source: Ambulatory Visit | Attending: Neurosurgery | Admitting: Neurosurgery

## 2015-09-02 ENCOUNTER — Encounter (HOSPITAL_COMMUNITY): Payer: Self-pay | Admitting: *Deleted

## 2015-09-02 ENCOUNTER — Encounter (HOSPITAL_COMMUNITY)
Admission: RE | Disposition: A | Discharge status: Home / Self Care | Payer: Self-pay | Source: Ambulatory Visit | Attending: Neurosurgery

## 2015-09-02 ENCOUNTER — Inpatient Hospital Stay (HOSPITAL_COMMUNITY): Payer: 59 | Admitting: Certified Registered"

## 2015-09-02 ENCOUNTER — Inpatient Hospital Stay (HOSPITAL_COMMUNITY): Payer: 59 | Admitting: Emergency Medicine

## 2015-09-02 DIAGNOSIS — I10 Essential (primary) hypertension: Secondary | ICD-10-CM | POA: Diagnosis present

## 2015-09-02 DIAGNOSIS — G4730 Sleep apnea, unspecified: Secondary | ICD-10-CM | POA: Diagnosis present

## 2015-09-02 DIAGNOSIS — M4806 Spinal stenosis, lumbar region: Secondary | ICD-10-CM | POA: Diagnosis present

## 2015-09-02 DIAGNOSIS — Z888 Allergy status to other drugs, medicaments and biological substances status: Secondary | ICD-10-CM | POA: Diagnosis not present

## 2015-09-02 DIAGNOSIS — K219 Gastro-esophageal reflux disease without esophagitis: Secondary | ICD-10-CM | POA: Diagnosis present

## 2015-09-02 DIAGNOSIS — M48062 Spinal stenosis, lumbar region with neurogenic claudication: Secondary | ICD-10-CM | POA: Diagnosis present

## 2015-09-02 DIAGNOSIS — M5431 Sciatica, right side: Secondary | ICD-10-CM

## 2015-09-02 DIAGNOSIS — Z419 Encounter for procedure for purposes other than remedying health state, unspecified: Secondary | ICD-10-CM

## 2015-09-02 HISTORY — PX: BACK SURGERY: SHX140

## 2015-09-02 LAB — GLUCOSE, CAPILLARY: GLUCOSE-CAPILLARY: 249 mg/dL — AB (ref 65–99)

## 2015-09-02 SURGERY — POSTERIOR LUMBAR FUSION 1 LEVEL
Anesthesia: General | Site: Spine Lumbar

## 2015-09-02 MED ORDER — PROPOFOL 10 MG/ML IV BOLUS
INTRAVENOUS | Status: DC | PRN
  Administered 2015-09-02: 200 mg via INTRAVENOUS

## 2015-09-02 MED ORDER — MAGNESIUM CITRATE PO SOLN: 1.0000 | Freq: Once | ORAL | Status: DC | PRN

## 2015-09-02 MED ORDER — SUGAMMADEX SODIUM 200 MG/2ML IV SOLN
INTRAVENOUS | Status: DC | PRN
  Administered 2015-09-02: 200 mg via INTRAVENOUS

## 2015-09-02 MED ORDER — LISINOPRIL 20 MG PO TABS
40.0000 mg | ORAL_TABLET | Freq: Every day | ORAL | Status: DC
  Administered 2015-09-03: 40 mg via ORAL
  Filled 2015-09-02 (×4): qty 2

## 2015-09-02 MED ORDER — ONDANSETRON HCL 4 MG/2ML IJ SOLN
INTRAMUSCULAR | Status: AC
  Filled 2015-09-02: qty 2

## 2015-09-02 MED ORDER — POLYETHYLENE GLYCOL 3350 17 G PO PACK
17.0000 g | PACK | Freq: Every day | ORAL | Status: DC
Start: 2015-09-02 — End: 2015-09-07
  Administered 2015-09-03 – 2015-09-06 (×4): 17 g via ORAL
  Filled 2015-09-02 (×5): qty 1

## 2015-09-02 MED ORDER — ACETAMINOPHEN 325 MG PO TABS: 650.0000 mg | ORAL_TABLET | ORAL | Status: DC | PRN

## 2015-09-02 MED ORDER — FENTANYL CITRATE (PF) 250 MCG/5ML IJ SOLN
INTRAMUSCULAR | Status: DC | PRN
  Administered 2015-09-02: 100 ug via INTRAVENOUS
  Administered 2015-09-02: 150 ug via INTRAVENOUS
  Administered 2015-09-02 (×5): 50 ug via INTRAVENOUS

## 2015-09-02 MED ORDER — METFORMIN HCL 500 MG PO TABS
1000.0000 mg | ORAL_TABLET | Freq: Two times a day (BID) | ORAL | Status: DC
  Administered 2015-09-03 – 2015-09-07 (×9): 1000 mg via ORAL
  Filled 2015-09-02 (×9): qty 2

## 2015-09-02 MED ORDER — LACTATED RINGERS IV SOLN
INTRAVENOUS | Status: DC
  Administered 2015-09-02 (×2): via INTRAVENOUS

## 2015-09-02 MED ORDER — BUPIVACAINE HCL (PF) 0.5 % IJ SOLN
INTRAMUSCULAR | Status: DC | PRN
  Administered 2015-09-02: 30 mL

## 2015-09-02 MED ORDER — SURGIFOAM 100 EX MISC
CUTANEOUS | Status: DC | PRN
  Administered 2015-09-02: 15:00:00 via TOPICAL

## 2015-09-02 MED ORDER — ICOSAPENT ETHYL 1 G PO CAPS: 1000.0000 mg | ORAL_CAPSULE | Freq: Two times a day (BID) | ORAL | Status: DC

## 2015-09-02 MED ORDER — LACTATED RINGERS IV SOLN: INTRAVENOUS | Status: DC

## 2015-09-02 MED ORDER — ADULT MULTIVITAMIN W/MINERALS CH
1.0000 | ORAL_TABLET | Freq: Every day | ORAL | Status: DC
  Administered 2015-09-02 – 2015-09-07 (×6): 1 via ORAL
  Filled 2015-09-02 (×6): qty 1

## 2015-09-02 MED ORDER — MENTHOL 3 MG MT LOZG: 1.0000 | LOZENGE | OROMUCOSAL | Status: DC | PRN

## 2015-09-02 MED ORDER — ALBUTEROL SULFATE (2.5 MG/3ML) 0.083% IN NEBU: 3.0000 mL | INHALATION_SOLUTION | Freq: Four times a day (QID) | RESPIRATORY_TRACT | Status: DC | PRN

## 2015-09-02 MED ORDER — SENNOSIDES-DOCUSATE SODIUM 8.6-50 MG PO TABS: 1.0000 | ORAL_TABLET | Freq: Every evening | ORAL | Status: DC | PRN

## 2015-09-02 MED ORDER — SUCCINYLCHOLINE CHLORIDE 20 MG/ML IJ SOLN
INTRAMUSCULAR | Status: AC
  Filled 2015-09-02: qty 1

## 2015-09-02 MED ORDER — SODIUM CHLORIDE 0.9% FLUSH: 3.0000 mL | INTRAVENOUS | Status: DC | PRN

## 2015-09-02 MED ORDER — SUGAMMADEX SODIUM 200 MG/2ML IV SOLN
INTRAVENOUS | Status: AC
  Filled 2015-09-02: qty 2

## 2015-09-02 MED ORDER — ETOMIDATE 2 MG/ML IV SOLN
INTRAVENOUS | Status: AC
  Filled 2015-09-02: qty 10

## 2015-09-02 MED ORDER — OMEGA-3-ACID ETHYL ESTERS 1 G PO CAPS
1.0000 g | ORAL_CAPSULE | Freq: Two times a day (BID) | ORAL | Status: DC
  Administered 2015-09-02 – 2015-09-07 (×10): 1 g via ORAL
  Filled 2015-09-02 (×10): qty 1

## 2015-09-02 MED ORDER — SODIUM CHLORIDE 0.9% FLUSH
3.0000 mL | Freq: Two times a day (BID) | INTRAVENOUS | Status: DC
  Administered 2015-09-03 – 2015-09-07 (×6): 3 mL via INTRAVENOUS

## 2015-09-02 MED ORDER — FENTANYL CITRATE (PF) 250 MCG/5ML IJ SOLN
INTRAMUSCULAR | Status: AC
  Filled 2015-09-02: qty 5

## 2015-09-02 MED ORDER — MIDAZOLAM HCL 2 MG/2ML IJ SOLN
INTRAMUSCULAR | Status: DC | PRN
  Administered 2015-09-02: 2 mg via INTRAVENOUS

## 2015-09-02 MED ORDER — DEXAMETHASONE SODIUM PHOSPHATE 10 MG/ML IJ SOLN
INTRAMUSCULAR | Status: AC
  Filled 2015-09-02: qty 1

## 2015-09-02 MED ORDER — POLYETHYLENE GLYCOL 3350 17 GM/SCOOP PO POWD
17.0000 g | Freq: Every day | ORAL | Status: DC
  Filled 2015-09-02: qty 255

## 2015-09-02 MED ORDER — PROCHLORPERAZINE EDISYLATE 5 MG/ML IJ SOLN
10.0000 mg | Freq: Four times a day (QID) | INTRAMUSCULAR | Status: DC | PRN
  Filled 2015-09-02: qty 2

## 2015-09-02 MED ORDER — PHENYLEPHRINE 40 MCG/ML (10ML) SYRINGE FOR IV PUSH (FOR BLOOD PRESSURE SUPPORT)
PREFILLED_SYRINGE | INTRAVENOUS | Status: AC
  Filled 2015-09-02: qty 10

## 2015-09-02 MED ORDER — ALPRAZOLAM 0.5 MG PO TABS
0.5000 mg | ORAL_TABLET | Freq: Two times a day (BID) | ORAL | Status: DC | PRN
  Administered 2015-09-05: 0.5 mg via ORAL
  Filled 2015-09-02: qty 1

## 2015-09-02 MED ORDER — ONDANSETRON HCL 4 MG/2ML IJ SOLN
4.0000 mg | INTRAMUSCULAR | Status: DC | PRN
  Administered 2015-09-03: 4 mg via INTRAVENOUS
  Filled 2015-09-02: qty 2

## 2015-09-02 MED ORDER — LIDOCAINE HCL (CARDIAC) 20 MG/ML IV SOLN
INTRAVENOUS | Status: DC | PRN
  Administered 2015-09-02: 100 mg via INTRAVENOUS

## 2015-09-02 MED ORDER — BISACODYL 5 MG PO TBEC
5.0000 mg | DELAYED_RELEASE_TABLET | Freq: Every day | ORAL | Status: DC | PRN
  Administered 2015-09-05: 5 mg via ORAL
  Filled 2015-09-02: qty 1

## 2015-09-02 MED ORDER — LIDOCAINE HCL (CARDIAC) 20 MG/ML IV SOLN
INTRAVENOUS | Status: AC
  Filled 2015-09-02: qty 5

## 2015-09-02 MED ORDER — GABAPENTIN 400 MG PO CAPS
400.0000 mg | ORAL_CAPSULE | Freq: Four times a day (QID) | ORAL | Status: DC
Start: 2015-09-02 — End: 2015-09-07
  Administered 2015-09-02 – 2015-09-07 (×17): 400 mg via ORAL
  Filled 2015-09-02 (×14): qty 1
  Filled 2015-09-02: qty 4
  Filled 2015-09-02 (×2): qty 1

## 2015-09-02 MED ORDER — ROCURONIUM BROMIDE 100 MG/10ML IV SOLN
INTRAVENOUS | Status: DC | PRN
  Administered 2015-09-02: 20 mg via INTRAVENOUS
  Administered 2015-09-02: 10 mg via INTRAVENOUS
  Administered 2015-09-02: 50 mg via INTRAVENOUS
  Administered 2015-09-02 (×2): 20 mg via INTRAVENOUS

## 2015-09-02 MED ORDER — 0.9 % SODIUM CHLORIDE (POUR BTL) OPTIME
TOPICAL | Status: DC | PRN
  Administered 2015-09-02: 1000 mL

## 2015-09-02 MED ORDER — MEPERIDINE HCL 25 MG/ML IJ SOLN: 6.2500 mg | INTRAMUSCULAR | Status: DC | PRN

## 2015-09-02 MED ORDER — HYDROCODONE-ACETAMINOPHEN 5-325 MG PO TABS
1.0000 | ORAL_TABLET | ORAL | Status: DC | PRN
  Administered 2015-09-03 – 2015-09-07 (×2): 2 via ORAL
  Filled 2015-09-02 (×2): qty 2

## 2015-09-02 MED ORDER — ZOLPIDEM TARTRATE 5 MG PO TABS: 5.0000 mg | ORAL_TABLET | Freq: Every evening | ORAL | Status: DC | PRN

## 2015-09-02 MED ORDER — EPHEDRINE SULFATE 50 MG/ML IJ SOLN
INTRAMUSCULAR | Status: AC
  Filled 2015-09-02: qty 1

## 2015-09-02 MED ORDER — MIDAZOLAM HCL 2 MG/2ML IJ SOLN
INTRAMUSCULAR | Status: AC
  Filled 2015-09-02: qty 2

## 2015-09-02 MED ORDER — CYCLOBENZAPRINE HCL 10 MG PO TABS
10.0000 mg | ORAL_TABLET | Freq: Three times a day (TID) | ORAL | Status: DC | PRN
  Administered 2015-09-03 – 2015-09-07 (×5): 10 mg via ORAL
  Filled 2015-09-02 (×5): qty 1

## 2015-09-02 MED ORDER — DOCUSATE SODIUM 100 MG PO CAPS
100.0000 mg | ORAL_CAPSULE | Freq: Two times a day (BID) | ORAL | Status: DC
  Administered 2015-09-02 – 2015-09-06 (×9): 100 mg via ORAL
  Filled 2015-09-02 (×10): qty 1

## 2015-09-02 MED ORDER — ROCURONIUM BROMIDE 50 MG/5ML IV SOLN
INTRAVENOUS | Status: AC
  Filled 2015-09-02: qty 1

## 2015-09-02 MED ORDER — INSULIN ASPART 100 UNIT/ML ~~LOC~~ SOLN
0.0000 [IU] | Freq: Three times a day (TID) | SUBCUTANEOUS | Status: DC
  Administered 2015-09-03: 5 [IU] via SUBCUTANEOUS
  Administered 2015-09-03: 3 [IU] via SUBCUTANEOUS
  Administered 2015-09-04: 2 [IU] via SUBCUTANEOUS
  Administered 2015-09-04 – 2015-09-05 (×3): 3 [IU] via SUBCUTANEOUS
  Administered 2015-09-05 – 2015-09-06 (×3): 2 [IU] via SUBCUTANEOUS

## 2015-09-02 MED ORDER — DEXAMETHASONE SODIUM PHOSPHATE 10 MG/ML IJ SOLN
INTRAMUSCULAR | Status: DC | PRN
  Administered 2015-09-02: 10 mg via INTRAVENOUS

## 2015-09-02 MED ORDER — MORPHINE SULFATE (PF) 2 MG/ML IV SOLN
1.0000 mg | INTRAVENOUS | Status: DC | PRN
  Administered 2015-09-02: 2 mg via INTRAVENOUS
  Administered 2015-09-03: 4 mg via INTRAVENOUS
  Administered 2015-09-03: 2 mg via INTRAVENOUS
  Administered 2015-09-03 (×2): 4 mg via INTRAVENOUS
  Administered 2015-09-04 (×2): 2 mg via INTRAVENOUS
  Filled 2015-09-02: qty 1
  Filled 2015-09-02: qty 2
  Filled 2015-09-02 (×2): qty 1
  Filled 2015-09-02: qty 2
  Filled 2015-09-02: qty 1
  Filled 2015-09-02: qty 2

## 2015-09-02 MED ORDER — ONDANSETRON HCL 4 MG/2ML IJ SOLN
INTRAMUSCULAR | Status: DC | PRN
  Administered 2015-09-02: 4 mg via INTRAVENOUS

## 2015-09-02 MED ORDER — METOCLOPRAMIDE HCL 10 MG PO TABS: 10.0000 mg | ORAL_TABLET | Freq: Four times a day (QID) | ORAL | Status: DC | PRN

## 2015-09-02 MED ORDER — SODIUM CHLORIDE 0.9 % IV SOLN: 250.0000 mL | INTRAVENOUS | Status: DC

## 2015-09-02 MED ORDER — LIDOCAINE-EPINEPHRINE 0.5 %-1:200000 IJ SOLN
INTRAMUSCULAR | Status: DC | PRN
  Administered 2015-09-02: 10 mL

## 2015-09-02 MED ORDER — PROPOFOL 10 MG/ML IV BOLUS
INTRAVENOUS | Status: AC
  Filled 2015-09-02: qty 20

## 2015-09-02 MED ORDER — PHENYLEPHRINE HCL 10 MG/ML IJ SOLN
INTRAMUSCULAR | Status: DC | PRN
  Administered 2015-09-02: 120 ug via INTRAVENOUS
  Administered 2015-09-02: 80 ug via INTRAVENOUS

## 2015-09-02 MED ORDER — ACETAMINOPHEN 650 MG RE SUPP: 650.0000 mg | RECTAL | Status: DC | PRN

## 2015-09-02 MED ORDER — POTASSIUM CHLORIDE IN NACL 20-0.9 MEQ/L-% IV SOLN
INTRAVENOUS | Status: DC
  Administered 2015-09-02 – 2015-09-03 (×2): via INTRAVENOUS
  Filled 2015-09-02 (×10): qty 1000

## 2015-09-02 MED ORDER — SODIUM CHLORIDE 0.9 % IJ SOLN
INTRAMUSCULAR | Status: AC
  Filled 2015-09-02: qty 10

## 2015-09-02 MED ORDER — FENTANYL CITRATE (PF) 100 MCG/2ML IJ SOLN: 25.0000 ug | INTRAMUSCULAR | Status: DC | PRN

## 2015-09-02 MED ORDER — PANTOPRAZOLE SODIUM 40 MG PO TBEC
40.0000 mg | DELAYED_RELEASE_TABLET | Freq: Every day | ORAL | Status: DC
  Administered 2015-09-02 – 2015-09-07 (×6): 40 mg via ORAL
  Filled 2015-09-02 (×6): qty 1

## 2015-09-02 MED ORDER — TORSEMIDE 20 MG PO TABS
20.0000 mg | ORAL_TABLET | ORAL | Status: DC
  Administered 2015-09-02 – 2015-09-06 (×2): 20 mg via ORAL
  Filled 2015-09-02 (×3): qty 1

## 2015-09-02 MED ORDER — OXYCODONE-ACETAMINOPHEN 5-325 MG PO TABS
1.0000 | ORAL_TABLET | ORAL | Status: DC | PRN
  Administered 2015-09-04 – 2015-09-06 (×7): 1 via ORAL
  Filled 2015-09-02 (×7): qty 1

## 2015-09-02 MED ORDER — PHENOL 1.4 % MT LIQD: 1.0000 | OROMUCOSAL | Status: DC | PRN

## 2015-09-02 SURGICAL SUPPLY — 64 items
BENZOIN TINCTURE PRP APPL 2/3 (GAUZE/BANDAGES/DRESSINGS) IMPLANT
BLADE CLIPPER SURG (BLADE) IMPLANT
BUR MATCHSTICK NEURO 3.0 LAGG (BURR) ×2 IMPLANT
BUR PRECISION FLUTE 5.0 (BURR) ×2 IMPLANT
CANISTER SUCT 3000ML PPV (MISCELLANEOUS) ×2 IMPLANT
CONT SPEC 4OZ CLIKSEAL STRL BL (MISCELLANEOUS) ×2 IMPLANT
COVER BACK TABLE 60X90IN (DRAPES) ×2 IMPLANT
DECANTER SPIKE VIAL GLASS SM (MISCELLANEOUS) ×2 IMPLANT
DERMABOND ADVANCED (GAUZE/BANDAGES/DRESSINGS) ×1
DERMABOND ADVANCED .7 DNX12 (GAUZE/BANDAGES/DRESSINGS) ×1 IMPLANT
DRAPE C-ARM 42X72 X-RAY (DRAPES) ×4 IMPLANT
DRAPE C-ARMOR (DRAPES) IMPLANT
DRAPE LAPAROTOMY 100X72X124 (DRAPES) ×2 IMPLANT
DRAPE POUCH INSTRU U-SHP 10X18 (DRAPES) ×2 IMPLANT
DRAPE SURG 17X23 STRL (DRAPES) ×2 IMPLANT
DRSG OPSITE POSTOP 4X8 (GAUZE/BANDAGES/DRESSINGS) ×2 IMPLANT
DURAPREP 26ML APPLICATOR (WOUND CARE) ×2 IMPLANT
ELECT BLADE 4.0 EZ CLEAN MEGAD (MISCELLANEOUS) ×2
ELECT REM PT RETURN 9FT ADLT (ELECTROSURGICAL) ×2
ELECTRODE BLDE 4.0 EZ CLN MEGD (MISCELLANEOUS) ×1 IMPLANT
ELECTRODE REM PT RTRN 9FT ADLT (ELECTROSURGICAL) ×1 IMPLANT
GAUZE SPONGE 4X4 12PLY STRL (GAUZE/BANDAGES/DRESSINGS) IMPLANT
GAUZE SPONGE 4X4 16PLY XRAY LF (GAUZE/BANDAGES/DRESSINGS) IMPLANT
GLOVE ECLIPSE 6.5 STRL STRAW (GLOVE) IMPLANT
GLOVE EXAM NITRILE LRG STRL (GLOVE) IMPLANT
GLOVE EXAM NITRILE MD LF STRL (GLOVE) IMPLANT
GLOVE EXAM NITRILE XL STR (GLOVE) IMPLANT
GLOVE EXAM NITRILE XS STR PU (GLOVE) IMPLANT
GOWN STRL REUS W/ TWL LRG LVL3 (GOWN DISPOSABLE) ×2 IMPLANT
GOWN STRL REUS W/ TWL XL LVL3 (GOWN DISPOSABLE) IMPLANT
GOWN STRL REUS W/TWL 2XL LVL3 (GOWN DISPOSABLE) IMPLANT
GOWN STRL REUS W/TWL LRG LVL3 (GOWN DISPOSABLE) ×2
GOWN STRL REUS W/TWL XL LVL3 (GOWN DISPOSABLE)
KIT BASIN OR (CUSTOM PROCEDURE TRAY) ×2 IMPLANT
KIT POSITION SURG JACKSON T1 (MISCELLANEOUS) ×2 IMPLANT
KIT ROOM TURNOVER OR (KITS) ×2 IMPLANT
LIQUID BAND (GAUZE/BANDAGES/DRESSINGS) ×2 IMPLANT
NEEDLE HYPO 25X1 1.5 SAFETY (NEEDLE) ×2 IMPLANT
NEEDLE SPNL 18GX3.5 QUINCKE PK (NEEDLE) IMPLANT
NS IRRIG 1000ML POUR BTL (IV SOLUTION) ×2 IMPLANT
PACK LAMINECTOMY NEURO (CUSTOM PROCEDURE TRAY) ×2 IMPLANT
PAD ARMBOARD 7.5X6 YLW CONV (MISCELLANEOUS) ×4 IMPLANT
ROD 30MM (Rod) ×1 IMPLANT
ROD 35MM (Rod) ×2 IMPLANT
ROD PLIF MAS PB SPHERX 30 (Rod) ×1 IMPLANT
SCREW LOCK (Screw) ×4 IMPLANT
SCREW LOCK FXNS SPNE MAS PL (Screw) ×4 IMPLANT
SCREW SHANK 5.5X30MM (Screw) ×6 IMPLANT
SCREW SHANKS 5.5X35 (Screw) ×2 IMPLANT
SCREW TULIP 5.5 (Screw) ×8 IMPLANT
SLEEVE SURGEON STRL (DRAPES) ×2 IMPLANT
SPACER OPAL 10X24 (Orthopedic Implant) ×2 IMPLANT
SPONGE LAP 4X18 X RAY DECT (DISPOSABLE) IMPLANT
SPONGE SURGIFOAM ABS GEL 100 (HEMOSTASIS) ×2 IMPLANT
STRIP CLOSURE SKIN 1/2X4 (GAUZE/BANDAGES/DRESSINGS) IMPLANT
SUT PROLENE 6 0 BV (SUTURE) IMPLANT
SUT VIC AB 0 CT1 18XCR BRD8 (SUTURE) ×1 IMPLANT
SUT VIC AB 0 CT1 8-18 (SUTURE) ×1
SUT VIC AB 2-0 CT1 18 (SUTURE) ×2 IMPLANT
SUT VIC AB 3-0 SH 8-18 (SUTURE) ×2 IMPLANT
TOWEL OR 17X24 6PK STRL BLUE (TOWEL DISPOSABLE) ×2 IMPLANT
TOWEL OR 17X26 10 PK STRL BLUE (TOWEL DISPOSABLE) ×2 IMPLANT
TRAY FOLEY W/METER SILVER 14FR (SET/KITS/TRAYS/PACK) ×2 IMPLANT
WATER STERILE IRR 1000ML POUR (IV SOLUTION) ×2 IMPLANT

## 2015-09-02 NOTE — Anesthesia Procedure Notes (Signed)
Procedure Name: Intubation Date/Time: 09/02/2015 2:32 PM Performed by: Sampson Si E Pre-anesthesia Checklist: Patient identified, Emergency Drugs available, Suction available, Patient being monitored and Timeout performed Patient Re-evaluated:Patient Re-evaluated prior to inductionOxygen Delivery Method: Circle system utilized Preoxygenation: Pre-oxygenation with 100% oxygen Intubation Type: IV induction Ventilation: Mask ventilation without difficulty and Oral airway inserted - appropriate to patient size Laryngoscope Size: Mac and 3 Grade View: Grade III Tube type: Oral Tube size: 7.0 mm Number of attempts: 2 Airway Equipment and Method: Stylet and Bougie stylet Placement Confirmation: ETT inserted through vocal cords under direct vision,  positive ETCO2 and breath sounds checked- equal and bilateral Secured at: 20 cm Tube secured with: Tape Dental Injury: Teeth and Oropharynx as per pre-operative assessment  Difficulty Due To: Difficulty was unanticipated and Difficult Airway- due to anterior larynx Future Recommendations: Recommend- induction with short-acting agent, and alternative techniques readily available Comments: Grade III view with Mac 3, first attempt entered esophagus, 2nd attempt successful with Bougie.

## 2015-09-02 NOTE — Transfer of Care (Signed)
Immediate Anesthesia Transfer of Care Note  Patient: Olivia Romero  Procedure(s) Performed: Procedure(s) with comments: LUMBAR FOUR-FIVE POSTERIOR LUMBAR FUSION  (N/A) - L45 decompression with posterior lumbar interbody fusion with interbody prosthesis posterior lateral arthrodesis and posterior nonsegmental instrumentation   Patient Location: PACU  Anesthesia Type:General  Level of Consciousness: awake  Airway & Oxygen Therapy: Patient Spontanous Breathing and Patient connected to nasal cannula oxygen  Post-op Assessment: Report given to RN and Patient moving all extremities X 4  Post vital signs: Reviewed and stable  Last Vitals:  Filed Vitals:   09/02/15 1038  BP: 96/50  Temp: 36.8 C  Resp: 20    Complications: No apparent anesthesia complications

## 2015-09-02 NOTE — Anesthesia Preprocedure Evaluation (Addendum)
Anesthesia Evaluation  Patient identified by MRN, date of birth, ID band Patient awake    Reviewed: Allergy & Precautions, NPO status , Patient's Chart, lab work & pertinent test results  History of Anesthesia Complications (+) PONV  Airway Mallampati: II  TM Distance: >3 FB Neck ROM: Full    Dental no notable dental hx. (+) Teeth Intact, Dental Advisory Given   Pulmonary asthma , sleep apnea ,    Pulmonary exam normal breath sounds clear to auscultation       Cardiovascular hypertension, Pt. on medications Normal cardiovascular exam Rhythm:Regular Rate:Normal     Neuro/Psych TIAnegative psych ROS   GI/Hepatic negative GI ROS, Neg liver ROS, GERD  Medicated and Controlled,  Endo/Other  negative endocrine ROSdiabetes, Type 2, Oral Hypoglycemic Agents  Renal/GU negative Renal ROS  negative genitourinary   Musculoskeletal negative musculoskeletal ROS (+)   Abdominal   Peds negative pediatric ROS (+)  Hematology negative hematology ROS (+)   Anesthesia Other Findings   Reproductive/Obstetrics negative OB ROS                          Anesthesia Physical Anesthesia Plan  ASA: III  Anesthesia Plan: General   Post-op Pain Management:    Induction: Intravenous  Airway Management Planned: Oral ETT  Additional Equipment:   Intra-op Plan:   Post-operative Plan: Extubation in OR  Informed Consent: I have reviewed the patients History and Physical, chart, labs and discussed the procedure including the risks, benefits and alternatives for the proposed anesthesia with the patient or authorized representative who has indicated his/her understanding and acceptance.   Dental advisory given  Plan Discussed with: CRNA  Anesthesia Plan Comments: (Pt DOES eat eggs, just in moderation. Reaction is swelling of lips. Easily relieved by PO benadryl in the past. OK to use propofol.)         Anesthesia Quick Evaluation

## 2015-09-02 NOTE — Op Note (Signed)
09/02/2015  6:19 PM  PATIENT:  Olivia Romero  60 y.o. 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,   POST-OPERATIVE DIAGNOSIS:Lumbar osteoarthritis with radiculopathy, lumbar stenosis L4/5   PROCEDURE:  Procedure(s):Lumbar 4/5 decompression, L4, L5 nerve root decompression LUMBAR FOUR-FIVE POSTERIOR LUMBAR Interbody FUSION, one cage 1mmx23mm on left side, morsels of autograft Lumbar 4/5 posterolateral arthrodesis, autograft morsels Non segmental pedicle screw fixation(Nuvasive mas plif) L4/5  SURGEON:  Surgeon(s): Ashok Pall, MD Erline Levine, MD  ASSISTANTS:Stern, Broadus John  ANESTHESIA:   general  EBL:  Total I/O In: 1500 [I.V.:1500] Out: 450 [Urine:300; Blood:150]  BLOOD ADMINISTERED:none  CELL SAVER GIVEN:none  COUNT:per nursing  DRAINS: none   SPECIMEN:  No Specimen  DICTATION: [name] is a 60 y.o. female whom was taken to the operating room intubated, and placed under a general anesthetic without difficulty. A foley catheter was placed under sterile conditions. She was positioned prone on a Jackson table with all pressure points properly padded.  Her lumbar region was prepped and draped in a sterile manner. I infiltrated 10cc's 1/2%lidocaine/1:2000,000 strength epinephrine into the planned incision. I opened the skin with a 10 blade and took the incision down to the thoracolumbar fascia. I exposed the lamina of L3,4, and 5 in a subperiosteal fashion bilaterally. I confirmed my location with an intraoperative xray.  I placed self retaining retractors and started the decompression.  I decompressed the spinal canal,and the L4, L5 nerve roots bilaterally via an L4 laminectomy with inferior facetectomies. There were partial superior L5 facetectomies bilaterally. I exposed the disc space initially on the left side with Dr. Melven Sartorius assistance. Removing the inferior L4 facets allowed for a full decompression of the L4  roots. This exposure was well beyond the that needed for a PLIF. However on the right side it certainly appeared that there was a conjoined root, as the disc space was completely covered by neural tissue. I removed the overlying bone and the nerve roots were  decompressed on the right side.   PLIF's were performed at L4/5 only on the left side . I opened the disc space with a 15 blade then used a variety of instruments to remove the disc and prepare the space for the arthrodesis. I used curettes, rongeurs, punches, shavers for the disc space, and rasps in the discetomy. I measured the disc space and placed 50mmx23mm  Peek cages(Synthes opal) into the disc space(s).  I decorticated the lateral bone at L4,and L5. I then placed autograft morsels on the decorticated surfaces to complete the posterolateral arthrodesis.  I placed pedicle screws at L4 and L5, using fluoroscopic guidance. I drilled a pilot hole, then cannulated the pedicle with a drill at each site. I then tapped each pedicle, assessing each site for pedicle violations. No cutouts were appreciated. Screws (nuvasive mas plif) were then placed at each site without difficulty. Final films were performed and all screws appeared to be in good position. I attached the tulip heads, then placed the rods into the screw heads. I secured the rods with locking caps. I closed the wound in a layered fashion. I approximated the thoracolumbar fascia, subcutaneous, and subcuticular planes with vicryl sutures. I used dermabond, and an occlusive bandage for a sterile dressing.     PLAN OF CARE: Admit to inpatient   PATIENT DISPOSITION:  PACU - hemodynamically stable.   Delay start of Pharmacological VTE agent (>24hrs) due to surgical blood loss or risk of bleeding:  yes

## 2015-09-02 NOTE — H&P (Signed)
BP 96/50 mmHg  Temp(Src) 98.2 F (36.8 C)  Resp 20  Ht 5' (1.524 m)  Wt 91.173 kg (201 lb)  BMI 39.26 kg/m2  SpO2 98%   Olivia Romero is a 60 year old woman who presents today for evaluation of neurogenic claudication and lumbar stenosis.  She says that she has had this problem really noticeably since 01/01 and over the past three weeks it has gotten much worse.  She has had two episodes of urinary incontinence since 01/01, where she simply was unable to hold the urine though she knew she had to go.  She has tried to work, though for the most part has been unsuccessful.  She is just in too much pain.       DATA:                                                  MRI was reviewed.  What it shows is some stenosis, worse on the left at L3-4, crowding of the neural foramen, and some facet arthropathy there.      Profound stenosis at L4-5, bilateral facet arthropathy, bilateral lateral recess stenosis, spondylolisthesis of L4 on L5 anteriorly.  Cauda is otherwise normal.  Conus is otherwise normal.              INTERVAL PFSH:                                  She works as a Gaffer.  She is right handed.  She does not smoke.  She does not drink alcohol.  She does not use illicit drugs.  Mother and father are both deceased.  Renal cell carcinoma, hypertension, and lung cancer are present in the family history.        PAST SURGICAL HISTORY:                  She has had an appendectomy and hysterectomy, and has had cervical spine surgery in 01/2014.      ALLERGIES:                                         SHE HAS ALLERGY TO PRAVACHOL, WHICH CAUSES SEVERE ITCHING AND LIPITOR WHICH WAS SHOWN WHEN SHE HAD AN EVALUATION FOR PRAVACHOL ALLERGY.                         MEDICATIONS:                         She takes Neurontin, Metformin, Bydureon, Lovaza, Meloxicam, Flexeril, and Reglan.        REVIEW OF SYSTEMS:                        She has recently been diagnosed with gastroparesis.   She says her pain is worse now.  She cannot control her bowels, either.  Review of systems is positive for night sweats, swelling and leg pain with walking, nausea, vomiting, arthritis, back pain, leg pain, joint pain, and food allergies.         PHYSICAL EXAMINATION:  Vital signs:  Height 4 feet 11 inches, weight 203.2 pounds, blood pressure is 134/80, temperature is 99 degrees Fahrenheit, pulse is 101, respiratory rate is 14, pain is 5/10.  On examination, she is alert, oriented x4, and answering all questions appropriately.  Memory, language, attention span, and fund of knowledge are normal.  Speech is clear.  It is fluent.  Pupils are equal, round, and reactive to light.  Symmetric facial movement.  Hearing intact to voice bilaterally.  Uvula elevates in the midline.  Shoulder shrug is normal.  Tongue protrudes in the midline.  Strength 5/5 in the upper and lower extremities.  Markedly antalgic gait.  Reflexes 2+ in knees, ankles, biceps, triceps, brachioradialis.  No clonus.  No Hoffman's sign.  Proprioception is intact in both the upper and lower extremities.  Romberg is negative.         IMPRESSION/PLAN:                             Spinal stenosis, neurogenic claudication, and spondylolisthesis.  I do not think there is any reasonable thing to offer Mrs. Breuninger outside of lumbar decompression and subsequent fusion at L4-5, using pedicle screws, rods, and probable PLIF.  I explained this procedure to her, the risks and benefits of bleeding, infection, no relief, need for further surgery, fusion failure, hardware failure, and other risks.  She understands and will contact me.  I gave her a detailed instruction sheet with regard to the operation.  I also gave her an out-of-work note.

## 2015-09-03 LAB — GLUCOSE, CAPILLARY
GLUCOSE-CAPILLARY: 205 mg/dL — AB (ref 65–99)
GLUCOSE-CAPILLARY: 249 mg/dL — AB (ref 65–99)
GLUCOSE-CAPILLARY: 199 mg/dL — AB (ref 65–99)
GLUCOSE-CAPILLARY: 114 mg/dL — AB (ref 65–99)

## 2015-09-03 NOTE — Care Management Note (Signed)
Case Management Note  Patient Details  Name: Olivia Romero MRN: [id number] Date of Birth: Jun 26, 1955  Subjective/Objective:                    Action/Plan: Patient was admitted for a lumbar fusion. Will follow for discharge needs pending PT/OT evals and physician orders.  Expected Discharge Date:                  Expected Discharge Plan:     In-House Referral:     Discharge planning Services     Post Acute Care Choice:    Choice offered to:     DME Arranged:    DME Agency:     HH Arranged:    HH Agency:     Status of Service:  In process, will continue to follow  Medicare Important Message Given:    Date Medicare IM Given:    Medicare IM give by:    Date Additional Medicare IM Given:    Additional Medicare Important Message give by:     If discussed at [place] of Stay Meetings, dates discussed:    Additional Comments:  Rolm Baptise, RN 09/03/2015, 8:41 AM [phone]

## 2015-09-03 NOTE — Evaluation (Signed)
Physical Therapy Evaluation Patient Details Name: Olivia Romero MRN: [id number] DOB: Feb 09, 1956 Today's Date: 09/03/2015   History of Present Illness  Pt is a 60 y.o. female s/p LUMBAR FOUR-FIVE POSTERIOR LUMBAR FUSION. Had cervical spine surgery in 01/2014.  Clinical Impression  Pt admitted with above diagnosis. Pt currently with functional limitations due to the deficits listed below (see PT Problem List). Pt will benefit from skilled PT to increase their independence and safety with mobility to allow discharge to the venue listed below.  Pt moving well overall, although with guarded slow gait pattern.  Pt will need a RW and BSC for home use, but do not feel she will need any post op PT at this time.  She will need stair training prior to dc as she has 2 steps to enter with no rails.     Follow Up Recommendations No PT follow up;Supervision - Intermittent    Equipment Recommendations  Rolling walker with 5" wheels;3in1 (PT)    Recommendations for Other Services       Precautions / Restrictions Precautions Precautions: Back Precaution Booklet Issued: Yes (comment) Precaution Comments: Pt recalled back precautions from earlier OT session Required Braces or Orthoses: Spinal Brace Spinal Brace: Lumbar corset Restrictions Weight Bearing Restrictions: No      Mobility  Bed Mobility Overal bed mobility: Needs Assistance Bed Mobility: Sidelying to Sit Rolling: Min guard Sidelying to sit: Min guard       General bed mobility comments: Pt in sidelying upon entry and at exit.  Reviewed precautions to avoid twisting in bed.  60  Transfers Overall transfer level: Needs assistance Equipment used: Rolling walker (2 wheeled) Transfers: Sit to/from Stand Sit to Stand: Min guard         General transfer comment: Stood from bed and BSC.  Ambulation/Gait Ambulation/Gait assistance: Min guard Ambulation Distance (Feet): 65 Feet Assistive device: Rolling walker (2  wheeled) Gait Pattern/deviations: Step-through pattern     General Gait Details: Slow guarded gait pattern.  Adjusted RW to lower height mid gait. Pt reports feeling weakness in LE.  Stairs            Wheelchair Mobility    Modified Rankin (Stroke Patients Only)       Balance Overall balance assessment: Needs assistance Sitting-balance support: Feet supported Sitting balance-Leahy Scale: Good     Standing balance support: No upper extremity supported;During functional activity Standing balance-Leahy Scale: Fair Standing balance comment: Pt able to stand at sink and wash hands without UE support                             Pertinent Vitals/Pain Pain Assessment: 0-10 Pain Score: 4  Faces Pain Scale: Hurts little more Pain Location: back Pain Descriptors / Indicators: Aching;Sore Pain Intervention(s): Monitored during session;Limited activity within patient's tolerance;Patient requesting pain meds-RN notified;Repositioned    Home Living Family/patient expects to be discharged to:: Private residence Living Arrangements: Children;Other relatives (grandchildren) Available Help at Discharge: Family;Friend(s);Available 24 hours/day Type of Home: House Home Access: Stairs to enter Entrance Stairs-Rails: None Entrance Stairs-Number of Steps: 2 Home Layout: One level Home Equipment: Walker - 4 wheels;Tub bench;Adaptive equipment      Prior Function Level of Independence: Independent with assistive device(s)         Comments: Used rollator for distance ambulation     Hand Dominance   Dominant Hand: Right    Extremity/Trunk Assessment   Upper Extremity Assessment: Defer to  OT evaluation           Lower Extremity Assessment: Overall WFL for tasks assessed      Cervical / Trunk Assessment: Other exceptions  Communication   Communication: No difficulties  Cognition Arousal/Alertness: Awake/alert Behavior During Therapy: WFL for tasks  assessed/performed Overall Cognitive Status: Within Functional Limits for tasks assessed                      General Comments General comments (skin integrity, edema, etc.): Brace had not yet arrived, but Dr. Christella Noa wanted pt up and walking even without it per nursing.    Exercises        Assessment/Plan    PT Assessment Patient needs continued PT services  PT Diagnosis Difficulty walking;Generalized weakness   PT Problem List Decreased mobility;Decreased knowledge of use of DME;Decreased knowledge of precautions;Decreased activity tolerance;Decreased strength  PT Treatment Interventions DME instruction;Gait training;Stair training;Functional mobility training;Therapeutic activities;Therapeutic exercise   PT Goals (Current goals can be found in the Care Plan section) Acute Rehab PT Goals Patient Stated Goal: return to being independent PT Goal Formulation: With patient Time For Goal Achievement: 09/10/15 Potential to Achieve Goals: Good    Frequency Min 5X/week   Barriers to discharge        Co-evaluation               End of Session Equipment Utilized During Treatment: Gait belt Activity Tolerance: Patient limited by fatigue;Patient limited by pain Patient left: in bed;with call bell/phone within reach Nurse Communication: Mobility status;Patient requests pain meds         Time: [id number] PT Time Calculation (min) (ACUTE ONLY): 24 min   Charges:   PT Evaluation $PT Eval Moderate Complexity: 1 Procedure PT Treatments $Gait Training: 8-22 mins   PT G Codes:        Declynn Lopresti LUBECK 09/03/2015, 2:33 PM

## 2015-09-03 NOTE — Anesthesia Postprocedure Evaluation (Signed)
Anesthesia Post Note  Patient: Olivia Romero  Procedure(s) Performed: Procedure(s) (LRB): LUMBAR FOUR-FIVE POSTERIOR LUMBAR FUSION  (N/A)  Patient location during evaluation: PACU Anesthesia Type: General Level of consciousness: awake Pain management: pain level controlled Vital Signs Assessment: post-procedure vital signs reviewed and stable Respiratory status: spontaneous breathing and respiratory function stable Cardiovascular status: stable Postop Assessment: no signs of nausea or vomiting Anesthetic complications: no    Last Vitals:  Filed Vitals:   09/03/15 0937 09/03/15 1018  BP: 120/54 110/56  Pulse: 92 95  Temp:  36.4 C  Resp: 18 20    Last Pain:  Filed Vitals:   09/03/15 1104  PainSc: 6                  Viet Kemmerer

## 2015-09-03 NOTE — Progress Notes (Signed)
Patient ID: Olivia Romero, female   DOB: 1956-02-08, 60 y.o.   MRN: [id number] BP 89/40 mmHg  Pulse 92  Temp(Src) 97.5 F (36.4 C) (Oral)  Resp 20  Ht 5' (1.524 m)  Wt 91.173 kg (201 lb)  BMI 39.26 kg/m2  SpO2 91% Alert and oriented x 4, speech clear and fluent Moving lower extremities well Wound is clean, and dry Doing well first day post op

## 2015-09-03 NOTE — Evaluation (Signed)
Occupational Therapy Evaluation Patient Details Name: Olivia Romero MRN: [id number] DOB: 09/09/1955 Today's Date: 09/03/2015    History of Present Illness Pt is a 60 y.o. female s/p LUMBAR FOUR-FIVE POSTERIOR LUMBAR FUSION. Had cervical spine surgery in 01/2014.   Clinical Impression   Pt reports she was independent with ADLs PTA. Pt currently min guard for safety with functional mobility and ADLs with the exception of max assist for LB ADLs. Began ADL, back, and safety education with pt; she verbalized understanding. Pt planning to d/c home with 24/7 supervision from family and friends. Pt would benefit from continued skilled OT to address established goals.    Follow Up Recommendations  No OT follow up;Supervision - Intermittent    Equipment Recommendations  3 in 1 bedside comode    Recommendations for Other Services PT consult     Precautions / Restrictions Precautions Precautions: Back Precaution Booklet Issued: Yes (comment) Precaution Comments: Educated pt on back precautions Required Braces or Orthoses: Spinal Brace Spinal Brace: Lumbar corset (No brace present on eval; RN OKed to get pt up.) Restrictions Weight Bearing Restrictions: No      Mobility Bed Mobility Overal bed mobility: Needs Assistance Bed Mobility: Rolling;Sidelying to Sit Rolling: Min guard Sidelying to sit: Min guard       General bed mobility comments: HOB flat with use of bed rail. Min guard for safety. Pt recalls bed mobility technique from prior therapy.  Transfers Overall transfer level: Needs assistance Equipment used: Rolling walker (2 wheeled) Transfers: Sit to/from Stand Sit to Stand: Min guard         General transfer comment: Min guard for safety. Pt with good hand placement and technique.    Balance Overall balance assessment: Needs assistance Sitting-balance support: Feet supported;No upper extremity supported Sitting balance-Leahy Scale: Good     Standing balance  support: No upper extremity supported;During functional activity Standing balance-Leahy Scale: Fair Standing balance comment: Pt able to stand at sink and wash hands without UE support                            ADL Overall ADL's : Needs assistance/impaired Eating/Feeding: Set up;Sitting   Grooming: Min guard;Standing;Wash/dry hands   Upper Body Bathing: Set up;Supervision/ safety;Sitting   Lower Body Bathing: Maximal assistance;Sit to/from stand   Upper Body Dressing : Minimal assistance;Sitting   Lower Body Dressing: Maximal assistance;Sit to/from stand Lower Body Dressing Details (indicate cue type and reason): Pt unable to cross foot over opposite knee. Discussed use of AE; pt interested and plan to practice next session. Toilet Transfer: Min guard;Ambulation;BSC;RW (BSC over toilet)   Toileting- Clothing Manipulation and Hygiene: Maximal assistance;Sit to/from stand       Functional mobility during ADLs: Min guard;Rolling walker General ADL Comments: Educated pt on log roll technique, maintaining back precautions during functional activities.      Vision     Perception     Praxis      Pertinent Vitals/Pain Pain Assessment: Faces Faces Pain Scale: Hurts little more Pain Location: back Pain Descriptors / Indicators: Aching;Sore Pain Intervention(s): Limited activity within patient's tolerance;Monitored during session;Premedicated before session;Repositioned     Hand Dominance Right   Extremity/Trunk Assessment Upper Extremity Assessment Upper Extremity Assessment: Overall WFL for tasks assessed   Lower Extremity Assessment Lower Extremity Assessment: Defer to PT evaluation   Cervical / Trunk Assessment Cervical / Trunk Assessment: Other exceptions Cervical / Trunk Exceptions: s/p lumbar sx   Communication  Communication Communication: No difficulties   Cognition Arousal/Alertness: Awake/alert Behavior During Therapy: WFL for tasks  assessed/performed Overall Cognitive Status: Within Functional Limits for tasks assessed                     General Comments       Exercises       Shoulder Instructions      Home Living Family/patient expects to be discharged to:: Private residence Living Arrangements: Children;Other relatives (grandchildren) Available Help at Discharge: Family;Friend(s);Available 24 hours/day Type of Home: House Home Access: Stairs to enter [employer] of Steps: 2 Entrance Stairs-Rails: None Home Layout: One level     Bathroom Shower/Tub: Tub/shower unit Shower/tub characteristics: Architectural technologist: Standard Bathroom Accessibility: Yes How Accessible: Accessible via walker Home Equipment: [place] - 4 wheels;Tub bench;Adaptive equipment Adaptive Equipment: Other (Comment) (toilet aide)        Prior Functioning/Environment Level of Independence: Independent with assistive device(s)        Comments: Used rollator for distance ambulation    OT Diagnosis: Generalized weakness;Acute pain   OT Problem List: Decreased strength;Decreased activity tolerance;Impaired balance (sitting and/or standing);Decreased safety awareness;Decreased knowledge of use of DME or AE;Decreased knowledge of precautions;Pain   OT Treatment/Interventions: Self-care/ADL training;Energy conservation;DME and/or AE instruction;Therapeutic activities;Patient/family education;Balance training    OT Goals(Current goals can be found in the care plan section) Acute Rehab OT Goals Patient Stated Goal: return to being independent OT Goal Formulation: With patient Time For Goal Achievement: 09/17/15 Potential to Achieve Goals: Good ADL Goals Pt Will Perform Lower Body Bathing: with adaptive equipment;sit to/from stand;with modified independence Pt Will Perform Lower Body Dressing: with adaptive equipment;sit to/from stand;with modified independence Pt Will Transfer to Toilet: ambulating;bedside  commode;with modified independence (BSC over toilet) Pt Will Perform Toileting - Clothing Manipulation and hygiene: with modified independence;with adaptive equipment;sit to/from stand Pt Will Perform Tub/Shower Transfer: Tub transfer;with modified independence;ambulating;tub bench;rolling walker Additional ADL Goal #1: Pt will independently don/doff back brace as precursor for ADLs and functional mobility. Additional ADL Goal #2: Pt will independently verbally recall 3/3 back precautions and maintain throughout ADL.  OT Frequency: Min 2X/week   Barriers to D/C:            Co-evaluation              End of Session Equipment Utilized During Treatment: Gait belt;Rolling walker Nurse Communication: Mobility status  Activity Tolerance: Patient tolerated treatment well Patient left: in chair;with call bell/phone within reach;with chair alarm set;with nursing/sitter in room   Time: [id number] OT Time Calculation (min): 30 min Charges:  OT General Charges $OT Visit: 1 Procedure OT Evaluation $OT Eval Moderate Complexity: 1 Procedure OT Treatments $Self Care/Home Management : 8-22 mins G-Codes:     Binnie Kand M.S., OTR/L Pager: [phone]  09/03/2015, 12:31 PM

## 2015-09-04 LAB — GLUCOSE, CAPILLARY
GLUCOSE-CAPILLARY: 188 mg/dL — AB (ref 65–99)
Glucose-Capillary: 144 mg/dL — ABNORMAL HIGH (ref 65–99)
Glucose-Capillary: 165 mg/dL — ABNORMAL HIGH (ref 65–99)
Glucose-Capillary: 167 mg/dL — ABNORMAL HIGH (ref 65–99)

## 2015-09-04 LAB — HEMOGLOBIN A1C
HEMOGLOBIN A1C: 8.6 % — AB (ref 4.8–5.6)
Mean Plasma Glucose: 200 mg/dL

## 2015-09-04 MED ORDER — SODIUM CHLORIDE 0.9 % IV BOLUS (SEPSIS)
500.0000 mL | Freq: Once | INTRAVENOUS | Status: AC
  Administered 2015-09-04: 500 mL via INTRAVENOUS

## 2015-09-04 NOTE — Progress Notes (Signed)
Subjective: Patient reports pain in back and right leg  Objective: Vital signs in last 24 hours: Temp:  [97.5 F (36.4 C)-99.7 F (37.6 C)] 99.1 F (37.3 C) (04/01 0510) Pulse Rate:  [92-111] 108 (04/01 0510) Resp:  [18-20] 18 (04/01 0510) BP: (66-120)/(34-56) 84/34 mmHg (04/01 0556) SpO2:  [91 %-98 %] 96 % (04/01 0510)  Intake/Output from previous day: 03/31 0701 - 04/01 0700 In: 3 [I.V.:3] Out: -  Intake/Output this shift:    Physical Exam: Strength full.  Dressing CDI.  Lab Results: No results for input(s): WBC, HGB, HCT, PLT in the last 72 hours. BMET No results for input(s): NA, K, CL, CO2, GLUCOSE, BUN, CREATININE, CALCIUM in the last 72 hours.  Studies/Results: Dg Lumbar Spine 2-3 Views  09/02/2015  CLINICAL DATA:  Internal fixation of L4-5. EXAM: DG C-ARM 61-120 MIN; LUMBAR SPINE - 2-3 VIEW COMPARISON:  Plain film of the lumbar spine dated 12/25/2011 and MRI of the lumbar spine dated 08/10/2015. FINDINGS: Two fluoroscopic intraoperative images of the lower lumbar spine are provided. Posterior transpedicular fixation hardware has been placed at the L4 and L5 levels, appropriately positioned. No surgical complicating features seen. Fluoroscopy provided for 1 minutes 42 seconds. IMPRESSION: Intraoperative fluoroscopic spot images of the lower lumbar spine showing posterior fixation hardware at the L4 and L5 levels. Hardware appears intact and appropriately positioned. No evidence of surgical complicating feature. Fluoroscopy provided for 1 minutes and 42 seconds. Electronically Signed   By: Franki Cabot M.D.   On: 09/02/2015 18:08   Dg C-arm 1-60 Min  09/02/2015  CLINICAL DATA:  Internal fixation of L4-5. EXAM: DG C-ARM 61-120 MIN; LUMBAR SPINE - 2-3 VIEW COMPARISON:  Plain film of the lumbar spine dated 12/25/2011 and MRI of the lumbar spine dated 08/10/2015. FINDINGS: Two fluoroscopic intraoperative images of the lower lumbar spine are provided. Posterior transpedicular  fixation hardware has been placed at the L4 and L5 levels, appropriately positioned. No surgical complicating features seen. Fluoroscopy provided for 1 minutes 42 seconds. IMPRESSION: Intraoperative fluoroscopic spot images of the lower lumbar spine showing posterior fixation hardware at the L4 and L5 levels. Hardware appears intact and appropriately positioned. No evidence of surgical complicating feature. Fluoroscopy provided for 1 minutes and 42 seconds. Electronically Signed   By: Franki Cabot M.D.   On: 09/02/2015 18:08    Assessment/Plan: Mobilize with PT.  Already on gabapentin, fluid bolus for decreased BP.      LOS: 2 days    Peggyann Shoals, MD 09/04/2015, 7:31 AM

## 2015-09-04 NOTE — Progress Notes (Signed)
Occupational Therapy Treatment Patient Details Name: Olivia Romero MRN: [id number] DOB: [birth year] Today's Date: 09/04/2015    History of present illness Pt is a 60 y.o. female s/p LUMBAR FOUR-FIVE POSTERIOR LUMBAR FUSION. Had cervical spine surgery in 01/2014.   OT comments  Pt. Able to complete toileting and bed mobility this session.  Limited by pain and fatigue.  Next session will focus on A/E and ADLS.  Still awaiting brace.    Follow Up Recommendations  No OT follow up;Supervision - Intermittent    Equipment Recommendations  3 in 1 bedside comode    Recommendations for Other Services      Precautions / Restrictions Precautions Precautions: Back Spinal Brace: Other (comment) (brace still not in room. Nursing had gotten pt. To recliner this am.  Limited mobility this session:  b.room and back to bed only) Restrictions Weight Bearing Restrictions: No       Mobility Bed Mobility   Bed Mobility: Sit to Supine;Sit to Sidelying;Rolling Rolling: Min guard     Sit to supine: Min guard Sit to sidelying: Min guard General bed mobility comments: cues for maintaining "no twisting" during transition into side lying.  pt. able to with hob flat, no rails, incresed time (up in recliner upon entry to room, states nursing staff got her up this morning)  Transfers                      Balance                                   ADL Overall ADL's : Needs assistance/impaired                         Toilet Transfer: Min guard;Ambulation;RW;BSC;Regular Toilet   Toileting- Clothing Manipulation and Hygiene: Maximal assistance;Sit to/from stand Toileting - Clothing Manipulation Details (indicate cue type and reason): reports she purchased an aide for home, is unable to reach peri area. max a in standing to complete after toileting     Functional mobility during ADLs: Min guard;Rolling walker General ADL Comments: Educated pt on log roll technique,  maintaining back precautions during functional activities.       Vision                     Perception     Praxis      Cognition   Behavior During Therapy: [facility] for tasks assessed/performed Overall Cognitive Status: Within Functional Limits for tasks assessed                       Extremity/Trunk Assessment               Exercises     Shoulder Instructions       General Comments      Pertinent Vitals/ Pain       Pain Assessment:  (states she is in a lot of pain due to meds on hold due to low BP) Pain Intervention(s): Repositioned  Home Living                                          Prior Functioning/Environment              Frequency Min 2X/week  Progress Toward Goals  OT Goals(current goals can now be found in the care plan section)  Progress towards OT goals: Progressing toward goals     Plan Discharge plan remains appropriate    Co-evaluation                 End of Session Equipment Utilized During Treatment: Gait belt;Rolling walker   Activity Tolerance Patient tolerated treatment well   Patient Left in bed;with call bell/phone within reach   Nurse Communication          Time: [id number] OT Time Calculation (min): 16 min  Charges: OT General Charges $OT Visit: 1 Procedure OT Treatments $Self Care/Home Management : 8-22 mins  Janice Coffin, COTA/L 09/04/2015, 10:20 AM

## 2015-09-04 NOTE — Progress Notes (Signed)
Physical Therapy Treatment Patient Details Name: Olivia Romero MRN: [id number] DOB: Oct 19, 1955 Today's Date: 09/04/2015    History of Present Illness Pt is a 60 y.o. female s/p LUMBAR FOUR-FIVE POSTERIOR LUMBAR FUSION. Had cervical spine surgery in 01/2014.    PT Comments    Pt able to increase ambulation distance with improved speed, but not up for stair training.  Will need stair training prior to dc.  Good recall of precautions.  Follow Up Recommendations  No PT follow up;Supervision - Intermittent     Equipment Recommendations  Rolling walker with 5" wheels;3in1 (PT)    Recommendations for Other Services       Precautions / Restrictions Precautions Precautions: Back Precaution Comments: recalled back precautions Required Braces or Orthoses: Spinal Brace Spinal Brace: Other (comment) Spinal Brace Comments: Brace has not yet arrived.  Nurse said MD aware.  MD wants pt up moving. Restrictions Weight Bearing Restrictions: No    Mobility  Bed Mobility Overal bed mobility: Needs Assistance Bed Mobility: Rolling;Sidelying to Sit Rolling: Supervision Sidelying to sit: Supervision       General bed mobility comments: Pt did well with bed mobility and proper body mechanics.  Transfers Overall transfer level: Needs assistance Equipment used: Rolling walker (2 wheeled) Transfers: Sit to/from [facility] Sit to Stand: Supervision Stand pivot transfers: Supervision       General transfer comment: cues for hand placement.  Transferred to [facility] prior to gait.  Ambulation/Gait Ambulation/Gait assistance: Min guard;Supervision Ambulation Distance (Feet): 120 Feet Assistive device: Rolling walker (2 wheeled) Gait Pattern/deviations: Step-through pattern Gait velocity: decreased Gait velocity interpretation: Below normal speed for age/gender General Gait Details: Slow guarded gait, but improved speed from yesterday's session.   Stairs             Wheelchair Mobility    Modified Rankin (Stroke Patients Only)       Balance   Sitting-balance support: Feet supported Sitting balance-Leahy Scale: Good     Standing balance support: During functional activity Standing balance-Leahy Scale: Fair                      Cognition Arousal/Alertness: Awake/alert Behavior During Therapy: WFL for tasks assessed/performed Overall Cognitive Status: Within Functional Limits for tasks assessed                      Exercises      General Comments        Pertinent Vitals/Pain Pain Assessment: 0-10 Pain Score: 3  Pain Location: back Pain Descriptors / Indicators: Operative site guarding Pain Intervention(s): Monitored during session;Repositioned    Home Living                      Prior Function            PT Goals (current goals can now be found in the care plan section) Acute Rehab PT Goals Patient Stated Goal: return to being independent PT Goal Formulation: With patient Time For Goal Achievement: 09/10/15 Potential to Achieve Goals: Good Progress towards PT goals: Progressing toward goals    Frequency  Min 5X/week    PT Plan Current plan remains appropriate    Co-evaluation             End of Session Equipment Utilized During Treatment: Gait belt Activity Tolerance: Patient tolerated treatment well Patient left: in chair;with call bell/phone within reach;with chair alarm set     Time: 1350-1407 PT Time  Calculation (min) (ACUTE ONLY): 17 min  Charges:  $Gait Training: 8-22 mins                    G Codes:      Johnross Nabozny LUBECK 09/04/2015, 2:43 PM

## 2015-09-05 LAB — GLUCOSE, CAPILLARY
GLUCOSE-CAPILLARY: 127 mg/dL — AB (ref 65–99)
GLUCOSE-CAPILLARY: 142 mg/dL — AB (ref 65–99)
GLUCOSE-CAPILLARY: 150 mg/dL — AB (ref 65–99)
Glucose-Capillary: 126 mg/dL — ABNORMAL HIGH (ref 65–99)
Glucose-Capillary: 177 mg/dL — ABNORMAL HIGH (ref 65–99)

## 2015-09-05 NOTE — Progress Notes (Signed)
No acute events Feeling better Moves legs well Incision looks good Home tomorrow

## 2015-09-05 NOTE — Progress Notes (Signed)
Occupational Therapy Treatment Patient Details Name: KYLEAH CAROLL MRN: [id number] DOB: June 14, 1955 Today's Date: 09/05/2015    History of present illness Pt is a 60 y.o. female s/p LUMBAR FOUR-FIVE POSTERIOR LUMBAR FUSION. Had cervical spine surgery in 01/2014.   OT comments  Pt. Moving well, completing functional mobility and toileting tasks with S.  Focus of next session will be tub transfer with bench.    Follow Up Recommendations  No OT follow up;Supervision - Intermittent    Equipment Recommendations  3 in 1 bedside comode    Recommendations for Other Services      Precautions / Restrictions Precautions Precautions: Back Precaution Comments: recalled back precautions Spinal Brace Comments: Brace has not yet arrived.  Nurse said MD aware.  MD wants pt up moving.       Mobility Bed Mobility               General bed mobility comments: in recliner upon arrival  Transfers Overall transfer level: Needs assistance Equipment used: Rolling walker (2 wheeled) Transfers: Sit to/from [employer] Transfers Sit to Stand: Supervision Stand pivot transfers: Supervision            Balance                                   ADL Overall ADL's : Needs assistance/impaired               Lower Body Bathing Details (indicate cue type and reason): reports family available to assist no A/E needs       Lower Body Dressing Details (indicate cue type and reason): reports family available to assist no A/E needs Toilet Transfer: Supervision/safety;Regular Toilet;Grab bars;RW     Toileting - Clothing Manipulation Details (indicate cue type and reason): thinks she may be able to reach peri-areas better with out 3n1 over the commode as it limits her front reaching abilities   Tub/Shower Transfer Details (indicate cue type and reason): pt. reports she has a tub bench, provided demonstration of technique.  will attempt next session Functional mobility  during ADLs: Supervision/safety;Rolling walker General ADL Comments: pt. requesting time in b.room for attempted BM.  will plan for tub transfer next session.        Vision                     Perception     Praxis      Cognition   Behavior During Therapy: WFL for tasks assessed/performed Overall Cognitive Status: Within Functional Limits for tasks assessed                       Extremity/Trunk Assessment               Exercises     Shoulder Instructions       General Comments      Pertinent Vitals/ Pain       Pain Assessment: No/denies pain  Home Living                                          Prior Functioning/Environment              Frequency Min 2X/week     Progress Toward Goals  OT Goals(current goals can now be found in the care  plan section)  Progress towards OT goals: Progressing toward goals     Plan Discharge plan remains appropriate    Co-evaluation                 End of Session Equipment Utilized During Treatment: Gait belt;Rolling walker   Activity Tolerance Patient tolerated treatment well   Patient Left Other (comment) (in b.room, educated on use of pulling string for assistance once finished)   Nurse Communication  (alerted CNA that pt. was in b.room to keep an eye out for when she is finished)        [id number] OT Time Calculation (min): 12 min  Charges: OT General Charges $OT Visit: 1 Procedure OT Treatments $Self Care/Home Management : 8-22 mins  Janice Coffin, COTA/L 09/05/2015, 9:38 AM

## 2015-09-05 NOTE — Progress Notes (Signed)
Physical Therapy Treatment Patient Details Name: Olivia Romero MRN: [id number] DOB: [birth year] Today's Date: 09/05/2015    History of Present Illness Pt is a 60 y.o. female s/p LUMBAR FOUR-FIVE POSTERIOR LUMBAR FUSION. Had cervical spine surgery in 01/2014.    PT Comments    Pt's back brace delivered today. Stair training complete. Pt may want to practice stairs again tomorrow prior to d/c.   Follow Up Recommendations  No PT follow up;Supervision - Intermittent     Equipment Recommendations  Rolling walker with 5" wheels;3in1 (PT)    Recommendations for Other Services       Precautions / Restrictions Precautions Precautions: Back Precaution Comments: Pt recalled 3/3 back precautions. Required Braces or Orthoses: Spinal Brace Spinal Brace: Applied in sitting position Spinal Brace Comments: Brace delivered 09-05-15 prior to Rx.    Mobility  Bed Mobility               General bed mobility comments: Pt in recliner upon arrival.  Transfers   Equipment used: Rolling walker (2 wheeled)   Sit to Stand: Supervision Stand pivot transfers: Supervision       General transfer comment: Verbal cues for sequencing and hand placement.  Ambulation/Gait Ambulation/Gait assistance: Supervision Ambulation Distance (Feet): 175 Feet Assistive device: Rolling walker (2 wheeled) Gait Pattern/deviations: Step-through pattern;Decreased stride length;Trunk flexed Gait velocity: decreased Gait velocity interpretation: Below normal speed for age/gender General Gait Details: slow, guarded gait; verbal cues for posture   Stairs Stairs: Yes Stairs assistance: Min assist Stair Management: Backwards;With walker;No rails Number of Stairs: 2 General stair comments: verbal cues for sequencing; attempted forward HHA as well. Pt prefered backward method with RW.  Wheelchair Mobility    Modified Rankin (Stroke Patients Only)       Balance                                     Cognition Arousal/Alertness: Awake/alert Behavior During Therapy: WFL for tasks assessed/performed Overall Cognitive Status: Within Functional Limits for tasks assessed                      Exercises      General Comments        Pertinent Vitals/Pain Pain Assessment: 0-10 Pain Score: 3  Pain Location: back Pain Descriptors / Indicators: Sore Pain Intervention(s): Monitored during session;Repositioned    Home Living                      Prior Function            PT Goals (current goals can now be found in the care plan section) Acute Rehab PT Goals Patient Stated Goal: return to being independent PT Goal Formulation: With patient Time For Goal Achievement: 09/10/15 Potential to Achieve Goals: Good Progress towards PT goals: Progressing toward goals    Frequency  Min 5X/week    PT Plan Current plan remains appropriate    Co-evaluation             End of Session Equipment Utilized During Treatment: Gait belt;Back brace Activity Tolerance: Patient tolerated treatment well Patient left: in chair;with call bell/phone within reach;with chair alarm set     Time: 1350-1416 PT Time Calculation (min) (ACUTE ONLY): 26 min  Charges:  $Gait Training: 23-37 mins  G Codes:      Lorriane Shire 09/05/2015, 2:30 PM

## 2015-09-06 LAB — GLUCOSE, CAPILLARY
GLUCOSE-CAPILLARY: 124 mg/dL — AB (ref 65–99)
GLUCOSE-CAPILLARY: 118 mg/dL — AB (ref 65–99)
Glucose-Capillary: 113 mg/dL — ABNORMAL HIGH (ref 65–99)
Glucose-Capillary: 121 mg/dL — ABNORMAL HIGH (ref 65–99)

## 2015-09-06 MED ORDER — OXYCODONE-ACETAMINOPHEN 5-325 MG PO TABS: 1.0000 | ORAL_TABLET | Freq: Four times a day (QID) | ORAL | Status: DC | PRN

## 2015-09-06 MED ORDER — CYCLOBENZAPRINE HCL 10 MG PO TABS: 10.0000 mg | ORAL_TABLET | Freq: Three times a day (TID) | ORAL | Status: DC | PRN

## 2015-09-06 NOTE — Discharge Instructions (Signed)

## 2015-09-06 NOTE — Discharge Summary (Signed)
Physician Discharge Summary  Patient ID: Olivia Romero MRN: [id number] DOB/AGE: 12/11/1955 60 y.o.  Admit date: 09/02/2015 Discharge date: 09/06/2015  Admission Diagnoses:Lumbar stenosis with neurogenic claudication  Discharge Diagnoses:  Active Problems:   Lumbar stenosis with neurogenic claudication   Discharged Condition: good  Hospital Course: Olivia Romero was admitted and taken to the operating room for an uncomplicated lumbar decompression and subsequent arthrodesis with pedicle screws. Post op she has worked with physical therapy, and occupational therapy. Her wound at discharge is clean, dry, and without signs of infection. She is voiding, and tolerating a regular diet.   Treatments: surgery: Procedure(s):Lumbar 4/5 decompression, L4, L5 nerve root decompression LUMBAR FOUR-FIVE POSTERIOR LUMBAR Interbody FUSION, one cage 93mmx23mm on left side, morsels of autograft Lumbar 4/5 posterolateral arthrodesis, autograft morsels Non segmental pedicle screw fixation(Nuvasive mas plif) L4/5   Discharge Exam: Blood pressure 136/68, pulse 93, temperature 98 F (36.7 C), temperature source Oral, resp. rate 18, height 5' (1.524 m), weight 91.173 kg (201 lb), SpO2 98 %. General appearance: alert, cooperative, appears stated age and mild distress Neurologic: Alert and oriented X 3, normal strength and tone. Normal symmetric reflexes. Normal coordination and gait  Disposition: 01-Home or Self Care spondylolisthesis    Medication List    STOP taking these medications        acetaminophen 325 MG tablet  Commonly known as:  TYLENOL     HYDROcodone-acetaminophen 5-325 MG tablet  Commonly known as:  NORCO/VICODIN     meloxicam 15 MG tablet  Commonly known as:  MOBIC      TAKE these medications        albuterol 108 (90 Base) MCG/ACT inhaler  Commonly known as:  PROVENTIL HFA;VENTOLIN HFA  Inhale 2 puffs into the lungs every 6 (six) hours as needed for wheezing or shortness of  breath.     ALPRAZolam 0.5 MG tablet  Commonly known as:  XANAX  Take 1 tablet (0.5 mg total) by mouth 2 (two) times daily as needed for anxiety.     aspirin 325 MG tablet  Take 325 mg by mouth daily.     cyclobenzaprine 10 MG tablet  Commonly known as:  FLEXERIL  Take 1 tablet (10 mg total) by mouth 3 (three) times daily as needed for muscle spasms.     Exenatide ER 2 MG Pen  Commonly known as:  BYDUREON  Inject 1 pen into the skin every Monday.     gabapentin 400 MG capsule  Commonly known as:  NEURONTIN  Take 400 mg by mouth 4 (four) times daily.     lisinopril 40 MG tablet  Commonly known as:  PRINIVIL,ZESTRIL  Take 1 tablet (40 mg total) by mouth daily.     metFORMIN 500 MG tablet  Commonly known as:  GLUCOPHAGE  Take 2 tablets (1,000 mg total) by mouth 2 (two) times daily with a meal.     metoCLOPramide 10 MG tablet  Commonly known as:  REGLAN  Take 10 mg by mouth every 6 (six) hours as needed for nausea or vomiting.     multivitamin with minerals Tabs tablet  Take 1 tablet by mouth daily. Reported on 07/21/2015     omeprazole 20 MG capsule  Commonly known as:  PRILOSEC  Take 20 mg by mouth daily.     oxyCODONE-acetaminophen 5-325 MG tablet  Commonly known as:  PERCOCET/ROXICET  Take 1 tablet by mouth every 6 (six) hours as needed for moderate pain.     polyethylene glycol  powder powder  Commonly known as:  GLYCOLAX/MIRALAX  Take 17 g by mouth daily.     torsemide 20 MG tablet  Commonly known as:  DEMADEX  Take 1 tablet (20 mg total) by mouth every other day.     VASCEPA 1 g Caps  Generic drug:  Icosapent Ethyl  Take 1,000 mg by mouth 2 (two) times daily.           Follow-up Information    Follow up with Jaquasia Doscher L, MD In 3 weeks.   Specialty:  Neurosurgery   Why:  call the office to make an appointment   Contact information:   [address] [place] [address] [phone]       Signed: Winfield Cunas 09/06/2015,  7:01 PM

## 2015-09-06 NOTE — Progress Notes (Addendum)
Physical Therapy Treatment Patient Details Name: Olivia Romero MRN: [id number] DOB: 27-Mar-1956 Today's Date: 09/06/2015    History of Present Illness Pt is a 60 y.o. female s/p LUMBAR FOUR-FIVE POSTERIOR LUMBAR FUSION. Had cervical spine surgery in 01/2014.    PT Comments    Pt independently donned/doffed back brace. She is doing well with mobility. Plan is for d/c home today. Pt educated on need for pillow between knees while sidelying in bed. Verbally reviewed stairs technique, ascend backward with RW. Pt demo understanding with teach back method. She declines the need to practice steps again this AM.   Follow Up Recommendations  No PT follow up;Supervision - Intermittent     Equipment Recommendations  Rolling walker with 5" wheels;3in1 (PT)    Recommendations for Other Services       Precautions / Restrictions Precautions Precautions: Back Precaution Comments: Pt recalled 3/3 back precautions. Required Braces or Orthoses: Spinal Brace Spinal Brace: Applied in sitting position    Mobility  Bed Mobility     Rolling: Modified independent (Device/Increase time) Sidelying to sit: Modified independent (Device/Increase time)     Sit to sidelying: Modified independent (Device/Increase time) General bed mobility comments: Pt demo proper logroll technique.  Transfers   Equipment used: Rolling walker (2 wheeled)   Sit to Stand: Supervision Stand pivot transfers: Supervision       General transfer comment: verbal cues for hand placement  Ambulation/Gait Ambulation/Gait assistance: Supervision Ambulation Distance (Feet): 200 Feet Assistive device: Rolling walker (2 wheeled) Gait Pattern/deviations: Step-through pattern;Decreased stride length Gait velocity: decreased Gait velocity interpretation: Below normal speed for age/gender General Gait Details: verbal cues for posture   Stairs            Wheelchair Mobility    Modified Rankin (Stroke Patients  Only)       Balance                                    Cognition Arousal/Alertness: Awake/alert Behavior During Therapy: WFL for tasks assessed/performed Overall Cognitive Status: Within Functional Limits for tasks assessed                      Exercises      General Comments        Pertinent Vitals/Pain Pain Assessment: 0-10 Pain Score: 4  Pain Location: Back Pain Descriptors / Indicators: Sore Pain Intervention(s): Monitored during session;Repositioned    Home Living                      Prior Function            PT Goals (current goals can now be found in the care plan section) Acute Rehab PT Goals Patient Stated Goal: return to being independent PT Goal Formulation: With patient Time For Goal Achievement: 09/10/15 Potential to Achieve Goals: Good Progress towards PT goals: Progressing toward goals    Frequency  Min 5X/week    PT Plan Current plan remains appropriate    Co-evaluation             End of Session Equipment Utilized During Treatment: Gait belt;Back brace Activity Tolerance: Patient tolerated treatment well Patient left: in bed;with call bell/phone within reach     Time: 0840-0903 PT Time Calculation (min) (ACUTE ONLY): 23 min  Charges:  $Gait Training: 8-22 mins $Therapeutic Activity: 8-22 mins  G Codes:      Olivia Romero 09/06/2015, 9:04 AM

## 2015-09-06 NOTE — Progress Notes (Signed)
Occupational Therapy Treatment Patient Details Name: Olivia Romero MRN: [id number] DOB: 04-04-1956 Today's Date: 09/06/2015    History of present illness Pt is a 60 y.o. female s/p LUMBAR FOUR-FIVE [place]. Had cervical spine surgery in 01/2014.   OT comments  Pt instructed in safety with tub transfer and use of tub transfer bench. SHe is able to verbalize how to safely perform ADLs. She is eager for discharge home.   Follow Up Recommendations  No OT follow up;Supervision - Intermittent    Equipment Recommendations  3 in 1 bedside comode    Recommendations for Other Services      Precautions / Restrictions Precautions Precautions: Back Precaution Comments: Pt recalled 3/3 back precautions. Required Braces or Orthoses: Spinal Brace Spinal Brace: Applied in sitting position       Mobility Bed Mobility                  Transfers                      Balance                                   ADL                                         General ADL Comments: Pt able to verbalize safe techniques for LB ADLs using AE.  She reports just getting back in bed and deferred active practice of tub transfer.  Pt verbally instructed in safe technque for tub transfer, also demonstrated for her.   Discussed use of walker bag/basket on walker, how to maneuver items around kitchen, and to remove or secure all throw rugs.  She verbalized understanding of all       Vision                     Perception     Praxis      Cognition   Behavior During Therapy: WFL for tasks assessed/performed Overall Cognitive Status: Within Functional Limits for tasks assessed                       Extremity/Trunk Assessment               Exercises     Shoulder Instructions       General Comments      Pertinent Vitals/ Pain       Pain Assessment: Faces Faces Pain Scale: Hurts a little bit Pain Location:  back  Pain Descriptors / Indicators: Aching Pain Intervention(s): Monitored during session  Home Living                                          Prior Functioning/Environment              Frequency Min 2X/week     Progress Toward Goals  OT Goals(current goals can now be found in the care plan section)  Progress towards OT goals: Progressing toward goals  ADL Goals Pt Will Perform Lower Body Bathing: with adaptive equipment;sit to/from stand;with modified independence Pt Will Perform Lower Body Dressing: with adaptive equipment;sit to/from stand;with modified independence Pt  Will Transfer to Toilet: ambulating;bedside commode;with modified independence Pt Will Perform Toileting - Clothing Manipulation and hygiene: with modified independence;with adaptive equipment;sit to/from stand Pt Will Perform Tub/Shower Transfer: Tub transfer;with modified independence;ambulating;tub bench;rolling walker Additional ADL Goal #1: Pt will independently don/doff back brace as precursor for ADLs and functional mobility. Additional ADL Goal #2: Pt will independently verbally recall 3/3 back precautions and maintain throughout ADL.  Plan Discharge plan remains appropriate    Co-evaluation                 End of Session     Activity Tolerance Patient tolerated treatment well   Patient Left in bed   Nurse Communication          Time: [id number] OT Time Calculation (min): 13 min  Charges: OT General Charges $OT Visit: 1 Procedure OT Treatments $Self Care/Home Management : 8-22 mins  Kenisha Lynds M 09/06/2015, 3:57 PM

## 2015-09-07 ENCOUNTER — Ambulatory Visit: Payer: 59 | Admitting: Family Medicine

## 2015-09-07 LAB — GLUCOSE, CAPILLARY: Glucose-Capillary: 118 mg/dL — ABNORMAL HIGH (ref 65–99)

## 2015-09-07 NOTE — Progress Notes (Signed)
Pt called RN into room to look at incision.  Scant amount of serosanguinous drainage noted on bed pad.  RN educated pt that drainage is normal and expected after surgery.  Pt requested to have another honeycomb dressing applied to back for the night.  Will continue to monitor.  Fredrich Romans, RN

## 2015-09-07 NOTE — Care Management Note (Signed)
Case Management Note  Patient Details  Name: Olivia Romero MRN: [id number] Date of Birth: 1956-03-04  Subjective/Objective:                    Action/Plan: Patient discharging home with self care. PT/OT recommending walker and 3 in 1. CM met with the patient and she already has this equipment at home. No further needs per CM.   Expected Discharge Date:                  Expected Discharge Plan:  Home/Self Care  In-House Referral:     Discharge planning Services  CM Consult  Post Acute Care Choice:    Choice offered to:     DME Arranged:    DME Agency:     HH Arranged:    [place] Agency:     Status of Service:  Completed, signed off  Medicare Important Message Given:    Date Medicare IM Given:    Medicare IM give by:    Date Additional Medicare IM Given:    Additional Medicare Important Message give by:     If discussed at [place] of Stay Meetings, dates discussed:    Additional Comments:  Pollie Friar, RN 09/07/2015, 10:43 AM

## 2015-09-07 NOTE — Progress Notes (Signed)
Pt being discharged home. Pt stable during discharge. Discharge instructions given. Pt belongings returned. Questions answered. Pt verbalized understanding.

## 2015-10-01 ENCOUNTER — Encounter (HOSPITAL_COMMUNITY): Payer: Self-pay | Admitting: *Deleted

## 2015-10-01 ENCOUNTER — Ambulatory Visit (HOSPITAL_COMMUNITY)
Admission: AD | Admit: 2015-10-01 | Discharge: 2015-10-02 | Disposition: A | Discharge status: Home / Self Care | Payer: 59 | Source: Ambulatory Visit | Attending: Neurosurgery | Admitting: Neurosurgery

## 2015-10-01 ENCOUNTER — Other Ambulatory Visit: Payer: Self-pay | Admitting: Neurosurgery

## 2015-10-01 ENCOUNTER — Encounter (HOSPITAL_COMMUNITY)
Admission: AD | Disposition: A | Discharge status: Home / Self Care | Payer: Self-pay | Source: Ambulatory Visit | Attending: Neurosurgery

## 2015-10-01 ENCOUNTER — Inpatient Hospital Stay (HOSPITAL_COMMUNITY): Payer: 59 | Admitting: Anesthesiology

## 2015-10-01 DIAGNOSIS — E785 Hyperlipidemia, unspecified: Secondary | ICD-10-CM | POA: Insufficient documentation

## 2015-10-01 DIAGNOSIS — I10 Essential (primary) hypertension: Secondary | ICD-10-CM | POA: Insufficient documentation

## 2015-10-01 DIAGNOSIS — K219 Gastro-esophageal reflux disease without esophagitis: Secondary | ICD-10-CM | POA: Insufficient documentation

## 2015-10-01 DIAGNOSIS — T8130XA Disruption of wound, unspecified, initial encounter: Principal | ICD-10-CM | POA: Insufficient documentation

## 2015-10-01 DIAGNOSIS — Y838 Other surgical procedures as the cause of abnormal reaction of the patient, or of later complication, without mention of misadventure at the time of the procedure: Secondary | ICD-10-CM | POA: Insufficient documentation

## 2015-10-01 DIAGNOSIS — T8189XA Other complications of procedures, not elsewhere classified, initial encounter: Secondary | ICD-10-CM | POA: Diagnosis present

## 2015-10-01 DIAGNOSIS — E1140 Type 2 diabetes mellitus with diabetic neuropathy, unspecified: Secondary | ICD-10-CM | POA: Diagnosis not present

## 2015-10-01 DIAGNOSIS — G4730 Sleep apnea, unspecified: Secondary | ICD-10-CM | POA: Insufficient documentation

## 2015-10-01 HISTORY — PX: LUMBAR WOUND DEBRIDEMENT: SHX1988

## 2015-10-01 LAB — BASIC METABOLIC PANEL
ANION GAP: 15 (ref 5–15)
BUN: 10 mg/dL (ref 6–20)
CALCIUM: 9.4 mg/dL (ref 8.9–10.3)
CO2: 19 mmol/L — ABNORMAL LOW (ref 22–32)
CREATININE: 0.6 mg/dL (ref 0.44–1.00)
Chloride: 105 mmol/L (ref 101–111)
Glucose, Bld: 176 mg/dL — ABNORMAL HIGH (ref 65–99)
Potassium: 4.2 mmol/L (ref 3.5–5.1)
SODIUM: 139 mmol/L (ref 135–145)

## 2015-10-01 LAB — CBC
HCT: 38.4 % (ref 36.0–46.0)
HEMOGLOBIN: 12.5 g/dL (ref 12.0–15.0)
MCH: 27.8 pg (ref 26.0–34.0)
MCHC: 32.6 g/dL (ref 30.0–36.0)
MCV: 85.5 fL (ref 78.0–100.0)
PLATELETS: 292 10*3/uL (ref 150–400)
RBC: 4.49 MIL/uL (ref 3.87–5.11)
RDW: 13.3 % (ref 11.5–15.5)
WBC: 9 10*3/uL (ref 4.0–10.5)

## 2015-10-01 LAB — GLUCOSE, CAPILLARY
GLUCOSE-CAPILLARY: 181 mg/dL — AB (ref 65–99)
GLUCOSE-CAPILLARY: 143 mg/dL — AB (ref 65–99)

## 2015-10-01 LAB — SURGICAL PCR SCREEN
MRSA, PCR: NEGATIVE
Staphylococcus aureus: NEGATIVE

## 2015-10-01 SURGERY — LUMBAR WOUND DEBRIDEMENT
Anesthesia: General

## 2015-10-01 MED ORDER — ONDANSETRON HCL 4 MG/2ML IJ SOLN: 4.0000 mg | Freq: Once | INTRAMUSCULAR | Status: DC | PRN

## 2015-10-01 MED ORDER — FENTANYL CITRATE (PF) 100 MCG/2ML IJ SOLN
INTRAMUSCULAR | Status: DC | PRN
  Administered 2015-10-01 (×3): 50 ug via INTRAVENOUS
  Administered 2015-10-01: 100 ug via INTRAVENOUS

## 2015-10-01 MED ORDER — SUGAMMADEX SODIUM 500 MG/5ML IV SOLN
INTRAVENOUS | Status: AC
  Filled 2015-10-01: qty 5

## 2015-10-01 MED ORDER — FENTANYL CITRATE (PF) 100 MCG/2ML IJ SOLN
INTRAMUSCULAR | Status: AC
  Filled 2015-10-01: qty 2

## 2015-10-01 MED ORDER — OXYCODONE HCL 5 MG/5ML PO SOLN: 5.0000 mg | Freq: Once | ORAL | Status: AC | PRN

## 2015-10-01 MED ORDER — SUGAMMADEX SODIUM 500 MG/5ML IV SOLN
INTRAVENOUS | Status: DC | PRN
  Administered 2015-10-01: 376 mg via INTRAVENOUS

## 2015-10-01 MED ORDER — ONDANSETRON HCL 4 MG/2ML IJ SOLN
INTRAMUSCULAR | Status: DC | PRN
Start: 2015-10-01 — End: 2015-10-01
  Administered 2015-10-01: 4 mg via INTRAVENOUS

## 2015-10-01 MED ORDER — ASPIRIN 325 MG PO TABS
325.0000 mg | ORAL_TABLET | Freq: Every day | ORAL | Status: DC
  Administered 2015-10-01 – 2015-10-02 (×2): 325 mg via ORAL
  Filled 2015-10-01 (×3): qty 1

## 2015-10-01 MED ORDER — ROCURONIUM BROMIDE 100 MG/10ML IV SOLN
INTRAVENOUS | Status: DC | PRN
  Administered 2015-10-01: 50 mg via INTRAVENOUS

## 2015-10-01 MED ORDER — ALBUTEROL SULFATE (2.5 MG/3ML) 0.083% IN NEBU: 2.5000 mg | INHALATION_SOLUTION | Freq: Four times a day (QID) | RESPIRATORY_TRACT | Status: DC | PRN

## 2015-10-01 MED ORDER — ONDANSETRON HCL 4 MG/2ML IJ SOLN: 4.0000 mg | INTRAMUSCULAR | Status: DC | PRN

## 2015-10-01 MED ORDER — CYCLOBENZAPRINE HCL 10 MG PO TABS: 10.0000 mg | ORAL_TABLET | Freq: Three times a day (TID) | ORAL | Status: DC | PRN

## 2015-10-01 MED ORDER — PHENOL 1.4 % MT LIQD: 1.0000 | OROMUCOSAL | Status: DC | PRN

## 2015-10-01 MED ORDER — LACTATED RINGERS IV SOLN
INTRAVENOUS | Status: DC
  Administered 2015-10-01: 11:00:00 via INTRAVENOUS

## 2015-10-01 MED ORDER — OXYCODONE-ACETAMINOPHEN 5-325 MG PO TABS: 1.0000 | ORAL_TABLET | Freq: Four times a day (QID) | ORAL | Status: DC | PRN

## 2015-10-01 MED ORDER — CEFAZOLIN SODIUM 1 G IJ SOLR
INTRAMUSCULAR | Status: AC
  Filled 2015-10-01: qty 20

## 2015-10-01 MED ORDER — OXYCODONE HCL 5 MG PO TABS
5.0000 mg | ORAL_TABLET | Freq: Once | ORAL | Status: AC | PRN
  Administered 2015-10-01: 5 mg via ORAL

## 2015-10-01 MED ORDER — SODIUM CHLORIDE 0.9% FLUSH: 3.0000 mL | INTRAVENOUS | Status: DC | PRN

## 2015-10-01 MED ORDER — ALPRAZOLAM 0.5 MG PO TABS: 0.5000 mg | ORAL_TABLET | Freq: Two times a day (BID) | ORAL | Status: DC | PRN

## 2015-10-01 MED ORDER — MIDAZOLAM HCL 2 MG/2ML IJ SOLN
INTRAMUSCULAR | Status: AC
  Filled 2015-10-01: qty 2

## 2015-10-01 MED ORDER — LACTATED RINGERS IV SOLN
INTRAVENOUS | Status: DC | PRN
  Administered 2015-10-01: 13:00:00 via INTRAVENOUS

## 2015-10-01 MED ORDER — LISINOPRIL 20 MG PO TABS
40.0000 mg | ORAL_TABLET | Freq: Every day | ORAL | Status: DC
  Administered 2015-10-02: 40 mg via ORAL
  Filled 2015-10-01: qty 2

## 2015-10-01 MED ORDER — 0.9 % SODIUM CHLORIDE (POUR BTL) OPTIME
TOPICAL | Status: DC | PRN
  Administered 2015-10-01: 1000 mL

## 2015-10-01 MED ORDER — ACETAMINOPHEN 325 MG PO TABS: 650.0000 mg | ORAL_TABLET | ORAL | Status: DC | PRN

## 2015-10-01 MED ORDER — PANTOPRAZOLE SODIUM 40 MG PO TBEC
40.0000 mg | DELAYED_RELEASE_TABLET | Freq: Every day | ORAL | Status: DC
  Administered 2015-10-02 (×2): 40 mg via ORAL
  Filled 2015-10-01: qty 1

## 2015-10-01 MED ORDER — POTASSIUM CHLORIDE IN NACL 20-0.9 MEQ/L-% IV SOLN
INTRAVENOUS | Status: DC
Start: 2015-10-01 — End: 2015-10-02
  Filled 2015-10-01 (×4): qty 1000

## 2015-10-01 MED ORDER — METOCLOPRAMIDE HCL 10 MG PO TABS: 10.0000 mg | ORAL_TABLET | Freq: Four times a day (QID) | ORAL | Status: DC | PRN

## 2015-10-01 MED ORDER — ACETAMINOPHEN 650 MG RE SUPP
650.0000 mg | RECTAL | Status: DC | PRN
Start: 2015-10-01 — End: 2015-10-02

## 2015-10-01 MED ORDER — ADULT MULTIVITAMIN W/MINERALS CH
1.0000 | ORAL_TABLET | Freq: Every day | ORAL | Status: DC
  Administered 2015-10-02: 1 via ORAL
  Filled 2015-10-01 (×2): qty 1

## 2015-10-01 MED ORDER — PROPOFOL 10 MG/ML IV BOLUS
INTRAVENOUS | Status: AC
  Filled 2015-10-01: qty 20

## 2015-10-01 MED ORDER — POLYETHYLENE GLYCOL 3350 17 GM/SCOOP PO POWD
17.0000 g | Freq: Every day | ORAL | Status: DC
  Filled 2015-10-01: qty 255

## 2015-10-01 MED ORDER — CEFAZOLIN SODIUM 1-5 GM-% IV SOLN
1.0000 g | Freq: Three times a day (TID) | INTRAVENOUS | Status: AC
  Administered 2015-10-01 – 2015-10-02 (×2): 1 g via INTRAVENOUS
  Filled 2015-10-01 (×2): qty 50

## 2015-10-01 MED ORDER — FENTANYL CITRATE (PF) 100 MCG/2ML IJ SOLN
25.0000 ug | INTRAMUSCULAR | Status: DC | PRN
  Administered 2015-10-01 (×2): 25 ug via INTRAVENOUS

## 2015-10-01 MED ORDER — MUPIROCIN 2 % EX OINT
1.0000 "application " | TOPICAL_OINTMENT | Freq: Once | CUTANEOUS | Status: AC
  Administered 2015-10-01: 1 via TOPICAL
  Filled 2015-10-01: qty 22

## 2015-10-01 MED ORDER — POLYETHYLENE GLYCOL 3350 17 G PO PACK
17.0000 g | PACK | Freq: Every day | ORAL | Status: DC
  Administered 2015-10-02: 17 g via ORAL
  Filled 2015-10-01: qty 1

## 2015-10-01 MED ORDER — HEMOSTATIC AGENTS (NO CHARGE) OPTIME
TOPICAL | Status: DC | PRN
  Administered 2015-10-01: 1 via TOPICAL

## 2015-10-01 MED ORDER — TORSEMIDE 20 MG PO TABS
20.0000 mg | ORAL_TABLET | ORAL | Status: DC
  Administered 2015-10-02: 20 mg via ORAL
  Filled 2015-10-01: qty 1

## 2015-10-01 MED ORDER — EXENATIDE ER 2 MG ~~LOC~~ PEN: 1.0000 "pen " | PEN_INJECTOR | Freq: Once | SUBCUTANEOUS | Status: DC

## 2015-10-01 MED ORDER — FENTANYL CITRATE (PF) 250 MCG/5ML IJ SOLN
INTRAMUSCULAR | Status: AC
  Filled 2015-10-01: qty 5

## 2015-10-01 MED ORDER — OXYCODONE HCL 5 MG PO TABS
ORAL_TABLET | ORAL | Status: AC
  Filled 2015-10-01: qty 1

## 2015-10-01 MED ORDER — SODIUM CHLORIDE 0.9% FLUSH
3.0000 mL | Freq: Two times a day (BID) | INTRAVENOUS | Status: DC
  Administered 2015-10-01: 3 mL via INTRAVENOUS

## 2015-10-01 MED ORDER — GABAPENTIN 400 MG PO CAPS
400.0000 mg | ORAL_CAPSULE | Freq: Four times a day (QID) | ORAL | Status: DC
  Administered 2015-10-01 – 2015-10-02 (×2): 400 mg via ORAL
  Filled 2015-10-01 (×2): qty 1

## 2015-10-01 MED ORDER — THROMBIN 5000 UNITS EX SOLR
CUTANEOUS | Status: DC | PRN
  Administered 2015-10-01 (×2): 5000 [IU] via TOPICAL

## 2015-10-01 MED ORDER — CEFAZOLIN SODIUM 1-5 GM-% IV SOLN
INTRAVENOUS | Status: DC | PRN
  Administered 2015-10-01: 2 g via INTRAVENOUS

## 2015-10-01 MED ORDER — OMEGA-3-ACID ETHYL ESTERS 1 G PO CAPS
1000.0000 mg | ORAL_CAPSULE | Freq: Two times a day (BID) | ORAL | Status: DC
  Administered 2015-10-02: 1000 mg via ORAL
  Filled 2015-10-01: qty 1

## 2015-10-01 MED ORDER — LIDOCAINE HCL (CARDIAC) 20 MG/ML IV SOLN
INTRAVENOUS | Status: DC | PRN
  Administered 2015-10-01: 30 mg via INTRAVENOUS

## 2015-10-01 MED ORDER — MENTHOL 3 MG MT LOZG
1.0000 | LOZENGE | OROMUCOSAL | Status: DC | PRN
  Administered 2015-10-01: 3 mg via ORAL
  Filled 2015-10-01: qty 9

## 2015-10-01 MED ORDER — PROPOFOL 10 MG/ML IV BOLUS
INTRAVENOUS | Status: DC | PRN
Start: 2015-10-01 — End: 2015-10-01
  Administered 2015-10-01: 150 mg via INTRAVENOUS

## 2015-10-01 MED ORDER — METFORMIN HCL 500 MG PO TABS
1000.0000 mg | ORAL_TABLET | Freq: Two times a day (BID) | ORAL | Status: DC
  Administered 2015-10-02: 1000 mg via ORAL
  Filled 2015-10-01: qty 2

## 2015-10-01 MED ORDER — MIDAZOLAM HCL 5 MG/5ML IJ SOLN
INTRAMUSCULAR | Status: DC | PRN
  Administered 2015-10-01: 2 mg via INTRAVENOUS

## 2015-10-01 SURGICAL SUPPLY — 42 items
BAG DECANTER FOR FLEXI CONT (MISCELLANEOUS) ×2 IMPLANT
BENZOIN TINCTURE PRP APPL 2/3 (GAUZE/BANDAGES/DRESSINGS) IMPLANT
BLADE CLIPPER SURG (BLADE) IMPLANT
CANISTER SUCT 3000ML PPV (MISCELLANEOUS) ×2 IMPLANT
DECANTER SPIKE VIAL GLASS SM (MISCELLANEOUS) IMPLANT
DRAPE LAPAROTOMY 100X72X124 (DRAPES) ×2 IMPLANT
DRAPE POUCH INSTRU U-SHP 10X18 (DRAPES) ×2 IMPLANT
DRAPE SURG 17X23 STRL (DRAPES) ×2 IMPLANT
DRSG OPSITE POSTOP 3X4 (GAUZE/BANDAGES/DRESSINGS) ×4 IMPLANT
DURAPREP 26ML APPLICATOR (WOUND CARE) ×2 IMPLANT
ELECT REM PT RETURN 9FT ADLT (ELECTROSURGICAL) ×2
ELECTRODE REM PT RTRN 9FT ADLT (ELECTROSURGICAL) ×1 IMPLANT
GAUZE SPONGE 2X2 8PLY STRL LF (GAUZE/BANDAGES/DRESSINGS) ×1 IMPLANT
GAUZE SPONGE 4X4 12PLY STRL (GAUZE/BANDAGES/DRESSINGS) IMPLANT
GAUZE SPONGE 4X4 16PLY XRAY LF (GAUZE/BANDAGES/DRESSINGS) IMPLANT
GLOVE BIOGEL PI IND STRL 6.5 (GLOVE) ×1 IMPLANT
GLOVE BIOGEL PI INDICATOR 6.5 (GLOVE) ×1
GOWN STRL REUS W/ TWL LRG LVL3 (GOWN DISPOSABLE) ×2 IMPLANT
GOWN STRL REUS W/ TWL XL LVL3 (GOWN DISPOSABLE) IMPLANT
GOWN STRL REUS W/TWL 2XL LVL3 (GOWN DISPOSABLE) IMPLANT
GOWN STRL REUS W/TWL LRG LVL3 (GOWN DISPOSABLE) ×2
GOWN STRL REUS W/TWL XL LVL3 (GOWN DISPOSABLE)
KIT BASIN OR (CUSTOM PROCEDURE TRAY) ×2 IMPLANT
KIT ROOM TURNOVER OR (KITS) ×2 IMPLANT
NS IRRIG 1000ML POUR BTL (IV SOLUTION) ×2 IMPLANT
PACK LAMINECTOMY NEURO (CUSTOM PROCEDURE TRAY) ×2 IMPLANT
PAD ARMBOARD 7.5X6 YLW CONV (MISCELLANEOUS) ×6 IMPLANT
SPONGE GAUZE 2X2 STER 10/PKG (GAUZE/BANDAGES/DRESSINGS) ×1
SPONGE LAP 4X18 X RAY DECT (DISPOSABLE) IMPLANT
SPONGE SURGIFOAM ABS GEL SZ50 (HEMOSTASIS) ×2 IMPLANT
STRIP CLOSURE SKIN 1/2X4 (GAUZE/BANDAGES/DRESSINGS) IMPLANT
STRIP SURGICAL 1/4 X 6 IN (GAUZE/BANDAGES/DRESSINGS) ×2 IMPLANT
SUT ETHILON 3 0 FSL (SUTURE) ×2 IMPLANT
SUT VIC AB 0 CT1 18XCR BRD8 (SUTURE) ×1 IMPLANT
SUT VIC AB 0 CT1 8-18 (SUTURE) ×1
SUT VIC AB 2-0 CT1 18 (SUTURE) ×2 IMPLANT
SUT VIC AB 3-0 SH 8-18 (SUTURE) ×2 IMPLANT
SWAB COLLECTION DEVICE MRSA (MISCELLANEOUS) ×2 IMPLANT
TOWEL OR 17X24 6PK STRL BLUE (TOWEL DISPOSABLE) ×2 IMPLANT
TOWEL OR 17X26 10 PK STRL BLUE (TOWEL DISPOSABLE) ×2 IMPLANT
TUBE ANAEROBIC SPECIMEN COL (MISCELLANEOUS) ×2 IMPLANT
WATER STERILE IRR 1000ML POUR (IV SOLUTION) ×2 IMPLANT

## 2015-10-01 NOTE — Progress Notes (Signed)
Arrived from [facility]. Alert and oriented x4. Pain level 2/10. Sit in chair with brace on. Oriented to room. Dressing CDI. Will Continue to monitor.

## 2015-10-01 NOTE — Anesthesia Procedure Notes (Signed)
Procedure Name: Intubation Date/Time: 10/01/2015 1:36 PM Performed by: Neldon Newport Pre-anesthesia Checklist: Patient being monitored, Suction available, Emergency Drugs available, Patient identified and Timeout performed Patient Re-evaluated:Patient Re-evaluated prior to inductionOxygen Delivery Method: Circle system utilized Preoxygenation: Pre-oxygenation with 100% oxygen Intubation Type: IV induction Ventilation: Mask ventilation without difficulty and Oral airway inserted - appropriate to patient size Laryngoscope Size: Mac, 3, Miller and 2 Grade View: Grade III Tube type: Oral Tube size: 7.0 mm Number of attempts: 2 Placement Confirmation: positive ETCO2,  ETT inserted through vocal cords under direct vision and breath sounds checked- equal and bilateral (Per Dr. Linna Caprice) Secured at: 22 cm Tube secured with: Tape Dental Injury: Teeth and Oropharynx as per pre-operative assessment

## 2015-10-01 NOTE — Anesthesia Postprocedure Evaluation (Deleted)
Anesthesia Post Note  Patient: Olivia Romero  Procedure(s) Performed: Procedure(s) (LRB): LUMBAR WOUND DEBRIDEMENT (N/A)  Patient location during evaluation: PACU Anesthesia Type: General Level of consciousness: awake, awake and alert and oriented Pain management: pain level controlled Vital Signs Assessment: post-procedure vital signs reviewed and stable Respiratory status: spontaneous breathing, nonlabored ventilation and respiratory function stable Cardiovascular status: blood pressure returned to baseline Anesthetic complications: no    Last Vitals:  Filed Vitals:   10/01/15 1630 10/01/15 1700  BP: 167/76 165/73  Pulse: 101 101  Temp: 36.9 C 37.3 C  Resp: 14 18    Last Pain:  Filed Vitals:   10/01/15 1727  PainSc: 2                  Nashaly Dorantes COKER

## 2015-10-01 NOTE — Transfer of Care (Signed)
Immediate Anesthesia Transfer of Care Note  Patient: Olivia Romero  Procedure(s) Performed: Procedure(s) with comments: LUMBAR WOUND DEBRIDEMENT (N/A) - LUMBAR WOUND DEBRIDEMENT  Patient Location: PACU  Anesthesia Type:General  Level of Consciousness: awake, alert  and oriented  Airway & Oxygen Therapy: Patient Spontanous Breathing and Patient connected to nasal cannula oxygen  Post-op Assessment: Report given to RN, Post -op Vital signs reviewed and stable and Patient moving all extremities X 4  Post vital signs: Reviewed and stable  Last Vitals:  Filed Vitals:   10/01/15 0900 10/01/15 1021  BP:  155/73  Pulse: 20 89  Temp: 37.3 C 37.3 C  Resp:  20    Last Pain: There were no vitals filed for this visit.    Patients Stated Pain Goal: 6 ([id number])  Complications: No apparent anesthesia complications

## 2015-10-01 NOTE — H&P (Signed)
Olivia Romero is an 60 y.o. female.   Chief Complaint: draining wound HPI: Olivia Romero underwent a lumbar decompression and continues after 4 weeks to have drainage from the wound. She will be admitted for open evaluation of the wound.   Past Medical History  Diagnosis Date  . Hemorrhoid     internal  . Neuropathy ([place])   . Diabetes mellitus without complication ([place])   . Hyperlipemia   . Stroke-like episode ([place]) 2009    TIA  . Diverticulosis of colon   . Esophagitis   . Colon polyps     adenomatous  . Hypertension   . Carpal tunnel syndrome on right   . Asthma   . PONV (postoperative nausea and vomiting)   . Sleep apnea     has had in the past lost 50 pounds and do longer uses cpap  . Pneumonia   . GERD (gastroesophageal reflux disease)   . Arthritis   . Anemia     Past Surgical History  Procedure Laterality Date  . Tonsillectomy    . Abdominal hysterectomy    . Colonoscopy N/A 11/30/2012    Procedure: COLONOSCOPY;  Surgeon: Irene Shipper, MD;  Location: [facility] ENDOSCOPY;  Service: Endoscopy;  Laterality: N/A;  . Cesarean section  1986  . Esophagogastroduodenoscopy (egd) with propofol N/A 01/14/2015    Procedure: ESOPHAGOGASTRODUODENOSCOPY (EGD) WITH PROPOFOL;  Surgeon: Manya Silvas, MD;  Location: [facility];  Service: Endoscopy;  Laterality: N/A;  . Savory dilation N/A 01/14/2015    Procedure: SAVORY DILATION;  Surgeon: Manya Silvas, MD;  Location: [facility] ENDOSCOPY;  Service: Endoscopy;  Laterality: N/A;  . Cervical fusion      Family History  Problem Relation Age of Onset  . Lung cancer Father   . Hypertension Father   . Arthritis Father   . Other Mother     hardening of the arteries/renal cell carcenoma  . Hypertension Mother   . Stroke Mother   . Arthritis Brother   . Rheum arthritis Maternal Uncle    Social History:  reports that she has never smoked. She has never used smokeless tobacco. She reports that she drinks alcohol. She reports that she  does not use illicit drugs.  Allergies:  Allergies  Allergen Reactions  . Eggs Or Egg-Derived Products Other (See Comments)    Angioedema  . Atorvastatin Other (See Comments)    Liver toxicity  . Flu Virus Vaccine Other (See Comments)  . Latex Itching  . Pravastatin Itching and Rash    Facility-administered medications prior to admission  Medication Dose Route Frequency Provider Last Rate Last Dose  . Exenatide ER PEN 1 pen  1 pen Subcutaneous Once Ashok Norris, MD       Medications Prior to Admission  Medication Sig Dispense Refill  . albuterol (PROVENTIL HFA;VENTOLIN HFA) 108 (90 BASE) MCG/ACT inhaler Inhale 2 puffs into the lungs every 6 (six) hours as needed for wheezing or shortness of breath. 1 Inhaler 3  . ALPRAZolam (XANAX) 0.5 MG tablet Take 1 tablet (0.5 mg total) by mouth 2 (two) times daily as needed for anxiety. 30 tablet 2  . aspirin 325 MG tablet Take 325 mg by mouth daily.    . cyclobenzaprine (FLEXERIL) 10 MG tablet Take 1 tablet (10 mg total) by mouth 3 (three) times daily as needed for muscle spasms. 90 tablet 0  . Exenatide ER (BYDUREON) 2 MG PEN Inject 1 pen into the skin every Monday. (Patient taking differently: Inject 1  pen into the skin every Friday. ) 1 each 3  . gabapentin (NEURONTIN) 400 MG capsule Take 400 mg by mouth 4 (four) times daily.    Vanessa Kick Ethyl (VASCEPA) 1 G CAPS Take 1,000 mg by mouth 2 (two) times daily.     [name] lisinopril (PRINIVIL,ZESTRIL) 40 MG tablet Take 1 tablet (40 mg total) by mouth daily. 30 tablet 0  . metFORMIN (GLUCOPHAGE) 500 MG tablet Take 2 tablets (1,000 mg total) by mouth 2 (two) times daily with a meal. 120 tablet 3  . metoCLOPramide (REGLAN) 10 MG tablet Take 10 mg by mouth every 6 (six) hours as needed for nausea or vomiting.    . Multiple Vitamin (MULTIVITAMIN WITH MINERALS) TABS Take 1 tablet by mouth daily. Reported on 07/21/2015    . omeprazole (PRILOSEC) 20 MG capsule Take 20 mg by mouth daily.    [name]  oxyCODONE-acetaminophen (PERCOCET/ROXICET) 5-325 MG tablet Take 1 tablet by mouth every 6 (six) hours as needed for moderate pain. 60 tablet 0  . polyethylene glycol powder (GLYCOLAX/MIRALAX) powder Take 17 g by mouth daily. 3350 g 5  . torsemide (DEMADEX) 20 MG tablet Take 1 tablet (20 mg total) by mouth every other day. 15 tablet 2    Results for orders placed or performed during the hospital encounter of 10/01/15 (from the past 48 hour(s))  Glucose, capillary     Status: Abnormal   Collection Time: 10/01/15 10:21 AM  Result Value Ref Range   Glucose-Capillary 181 (H) 65 - 99 mg/dL  Basic metabolic panel     Status: Abnormal   Collection Time: 10/01/15 10:41 AM  Result Value Ref Range   Sodium 139 135 - 145 mmol/L    Comment: POST-ULTRACENTRIFUGATION   Potassium 4.2 3.5 - 5.1 mmol/L    Comment: POST-ULTRACENTRIFUGATION   Chloride 105 101 - 111 mmol/L    Comment: POST-ULTRACENTRIFUGATION   CO2 19 (L) 22 - 32 mmol/L    Comment: POST-ULTRACENTRIFUGATION   Glucose, Bld 176 (H) 65 - 99 mg/dL    Comment: POST-ULTRACENTRIFUGATION   BUN 10 6 - 20 mg/dL    Comment: POST-ULTRACENTRIFUGATION   Creatinine, Ser 0.60 0.44 - 1.00 mg/dL    Comment: POST-ULTRACENTRIFUGATION   Calcium 9.4 8.9 - 10.3 mg/dL    Comment: POST-ULTRACENTRIFUGATION   GFR calc non Af Amer >60 >60 mL/min   GFR calc Af Amer >60 >60 mL/min    Comment: (NOTE) The eGFR has been calculated using the CKD EPI equation. This calculation has not been validated in all clinical situations. eGFR's persistently <60 mL/min signify possible Chronic Kidney Disease.    Anion gap 15 5 - 15  CBC     Status: None   Collection Time: 10/01/15 10:41 AM  Result Value Ref Range   WBC 9.0 4.0 - 10.5 K/uL   RBC 4.49 3.87 - 5.11 MIL/uL   Hemoglobin 12.5 12.0 - 15.0 g/dL   HCT 38.4 36.0 - 46.0 %   MCV 85.5 78.0 - 100.0 fL   MCH 27.8 26.0 - 34.0 pg   MCHC 32.6 30.0 - 36.0 g/dL   RDW 13.3 11.5 - 15.5 %   Platelets 292 150 - 400 K/uL   Surgical pcr screen     Status: None   Collection Time: 10/01/15 10:41 AM  Result Value Ref Range   MRSA, PCR NEGATIVE NEGATIVE   Staphylococcus aureus NEGATIVE NEGATIVE    Comment:        The Xpert SA Assay (FDA approved for NASAL  specimens in patients over 12 years of age), is one component of a comprehensive surveillance program.  Test performance has been validated by [facility] for patients greater than or equal to 75 year old. It is not intended to diagnose infection nor to guide or monitor treatment.    No results found.  Review of Systems  Constitutional: Negative.   HENT: Negative.   Eyes: Negative.   Respiratory: Negative.   Cardiovascular: Negative.   Gastrointestinal: Negative.   Genitourinary: Negative.   Musculoskeletal: Positive for back pain.  Skin:       Draining lumbar wound, minimal erythema  Neurological: Negative.   Endo/Heme/Allergies: Negative.   Psychiatric/Behavioral: Negative.     Blood pressure 155/73, pulse 89, temperature 99.2 F (37.3 C), resp. rate 20, SpO2 97 %. Physical Exam  Constitutional: She is oriented to person, place, and time. She appears well-developed and well-nourished. No distress.  HENT:  Head: Normocephalic and atraumatic.  Eyes: Conjunctivae and EOM are normal. Pupils are equal, round, and reactive to light.  Neck: Normal range of motion. Neck supple.  Cardiovascular: Normal rate, regular rhythm, normal heart sounds and intact distal pulses.   Respiratory: Effort normal and breath sounds normal.  GI: Soft. Bowel sounds are normal.  Neurological: She is alert and oriented to person, place, and time. She has normal reflexes. She displays normal reflexes. No cranial nerve deficit. She exhibits normal muscle tone. Coordination normal.  Skin: She is not diaphoretic.  Draining lumbar wound, minimal surrounding erythema   Psychiatric: She has a normal mood and affect. Her behavior is normal. Judgment and thought content  normal.     Assessment/Plan Possible wound infection admitted for debridement.  Lark Langenfeld L, MD 10/01/2015, 1:32 PM

## 2015-10-01 NOTE — Anesthesia Preprocedure Evaluation (Addendum)
Anesthesia Evaluation  Patient identified by MRN, date of birth, ID band Patient awake    Reviewed: Allergy & Precautions, H&P , NPO status , Patient's Chart, lab work & pertinent test results  History of Anesthesia Complications (+) PONV and history of anesthetic complications  Airway Mallampati: II  TM Distance: <3 FB Neck ROM: full    Dental  (+) Teeth Intact, Dental Advidsory Given   Pulmonary asthma , sleep apnea ,    breath sounds clear to auscultation       Cardiovascular hypertension,  Rhythm:regular     Neuro/Psych  Neuromuscular disease    GI/Hepatic GERD  Medicated and Controlled,  Endo/Other  diabetes  Renal/GU      Musculoskeletal   Abdominal   Peds  Hematology  (+) anemia ,   Anesthesia Other Findings   Reproductive/Obstetrics                           Anesthesia Physical Anesthesia Plan  ASA: III  Anesthesia Plan:    Post-op Pain Management:    Induction:   Airway Management Planned:   Additional Equipment:   Intra-op Plan:   Post-operative Plan:   Informed Consent:   Dental Advisory Given  Plan Discussed with:   Anesthesia Plan Comments:        Anesthesia Quick Evaluation

## 2015-10-01 NOTE — Op Note (Signed)
10/01/2015  2:27 PM  PATIENT:  Olivia Romero  59 y.o. female whom underwent a lumbar fusion   PRE-OPERATIVE DIAGNOSIS:  WOUND DRAINAGE  POST-OPERATIVE DIAGNOSIS:  WOUND DRAINAGE  PROCEDURE:  Procedure(s): LUMBAR WOUND DEBRIDEMENT  SURGEON: Surgeon(s): Ashok Pall, MD  ASSISTANTS:none  ANESTHESIA:   general  EBL:     BLOOD ADMINISTERED:none  CELL SAVER GIVEN:none  COUNT:per nursing  DRAINS: none   SPECIMEN:  Source of Specimen:  wound, lumbar  DICTATION: Olivia Romero was taken to the operating room, intubated, and placed under a general anesthetic without difficulty. female She was prepped and draped in a sterile manner. I inspected the two very small areas that had not completely healed. Both were the size of a q tip. I probed the inferior opening and could penetrate deeply, but i did appreciate a small (less than a penny size space, underneath the incision line. I irrigated that area. I also sent cultures from the space. The rostral opening was only superficial, not infected. i irrigated then closed that wound. The caudal space was packed. I placed occlusive bandages over both areas. The skin was not indurated, nor with any generalized erythema.   PLAN OF CARE: Admit for overnight observation  PATIENT DISPOSITION:  PACU - hemodynamically stable.   Delay start of Pharmacological VTE agent (>24hrs) due to surgical blood loss or risk of bleeding:  yes

## 2015-10-02 DIAGNOSIS — T8130XA Disruption of wound, unspecified, initial encounter: Secondary | ICD-10-CM | POA: Diagnosis not present

## 2015-10-02 LAB — GLUCOSE, CAPILLARY
GLUCOSE-CAPILLARY: 204 mg/dL — AB (ref 65–99)
Glucose-Capillary: 188 mg/dL — ABNORMAL HIGH (ref 65–99)

## 2015-10-02 NOTE — Discharge Instructions (Signed)
Please shower each day, and let the wound see soap and water. Pat the wound dry  Pack the wound at least once a day, if possible twice per day Call if the wound is draining foul smelling fluid, or obvious purulent material Call if your temperature is greater than 101.5

## 2015-10-02 NOTE — Discharge Summary (Signed)
Physician Discharge Summary  Patient ID: Olivia Romero MRN: [id number] DOB/AGE: 1956/04/02 60 y.o.  Admit date: 10/01/2015 Discharge date: 10/02/2015  Admission Diagnoses:wound infection  Discharge Diagnoses:  Active Problems:   Wound dehiscence   Discharged Condition: good  Hospital Course: [name] was admitted for a draining wound and possible infection. Intraoperatively I did not find evidence for infection. There was a small pocket underneath the skin roughly the size of a dime, but no purulence. This area was packed. She is doing very well post op, ambulating, voiding, and tolerating a regular diet.   Treatments: surgery: wound debridement  Discharge Exam: Blood pressure 111/45, pulse 86, temperature 97.9 F (36.6 C), temperature source Oral, resp. rate 17, SpO2 99 %. General appearance: alert, cooperative, appears stated age and no distress Neurologic: Alert and oriented X 3, normal strength and tone. Normal symmetric reflexes. Normal coordination and gait  Disposition: 01-Home or Self Care WOUND DRAINAGE    Medication List    TAKE these medications        albuterol 108 (90 Base) MCG/ACT inhaler  Commonly known as:  PROVENTIL HFA;VENTOLIN HFA  Inhale 2 puffs into the lungs every 6 (six) hours as needed for wheezing or shortness of breath.     ALPRAZolam 0.5 MG tablet  Commonly known as:  XANAX  Take 1 tablet (0.5 mg total) by mouth 2 (two) times daily as needed for anxiety.     aspirin 325 MG tablet  Take 325 mg by mouth daily.     cyclobenzaprine 10 MG tablet  Commonly known as:  FLEXERIL  Take 1 tablet (10 mg total) by mouth 3 (three) times daily as needed for muscle spasms.     Exenatide ER 2 MG Pen  Commonly known as:  BYDUREON  Inject 1 pen into the skin every Monday.     gabapentin 400 MG capsule  Commonly known as:  NEURONTIN  Take 400 mg by mouth 4 (four) times daily.     lisinopril 40 MG tablet  Commonly known as:  PRINIVIL,ZESTRIL   Take 1 tablet (40 mg total) by mouth daily.     metFORMIN 500 MG tablet  Commonly known as:  GLUCOPHAGE  Take 2 tablets (1,000 mg total) by mouth 2 (two) times daily with a meal.     metoCLOPramide 10 MG tablet  Commonly known as:  REGLAN  Take 10 mg by mouth every 6 (six) hours as needed for nausea or vomiting.     multivitamin with minerals Tabs tablet  Take 1 tablet by mouth daily. Reported on 07/21/2015     omeprazole 20 MG capsule  Commonly known as:  PRILOSEC  Take 20 mg by mouth daily.     oxyCODONE-acetaminophen 5-325 MG tablet  Commonly known as:  PERCOCET/ROXICET  Take 1 tablet by mouth every 6 (six) hours as needed for moderate pain.     polyethylene glycol powder powder  Commonly known as:  GLYCOLAX/MIRALAX  Take 17 g by mouth daily.     torsemide 20 MG tablet  Commonly known as:  DEMADEX  Take 1 tablet (20 mg total) by mouth every other day.     VASCEPA 1 g Caps  Generic drug:  Icosapent Ethyl  Take 1,000 mg by mouth 2 (two) times daily.           Follow-up Information    Follow up with Benino Korinek L, MD In 2 weeks.   Specialty:  Neurosurgery   Why:  For wound re-check  Contact information:   [address] [place] [address] [phone]       Signed: Winfield Cunas 10/02/2015, 12:22 PM

## 2015-10-02 NOTE — Progress Notes (Signed)
Discharge instructions discussed with patient, importance of hand washing is emphasized to patient. Patient is transported downstairs to family vehicle.

## 2015-10-02 NOTE — Care Management Note (Signed)
Case Management Note  Patient Details  Name: Olivia Romero MRN: [id number] Date of Birth: [birth year]  Subjective/Objective:                  draining wound Action/Plan: Discharge planning Expected Discharge Date:  10/02/15               Expected Discharge Plan:  [place]  In-House Referral:     Discharge planning Services  CM Consult  Post Acute Care Choice:  Home Health Choice offered to:  Patient  DME Arranged:    DME Agency:  NA  HH Arranged:  RN, Nurse's Aide [place] Agency:  [place]  Status of Service:  Completed, signed off  Medicare Important Message Given:    Date Medicare IM Given:    Medicare IM give by:    Date Additional Medicare IM Given:    Additional Medicare Important Message give by:     If discussed at [place] of Stay Meetings, dates discussed:    Additional Comments: CM met with pt who states she does not need and DME.  MD has placed order for [facility] aide and CM has explained to RN, MD might wish to place dressing orders for [facility] as [facility] order cannot stand alone without HHRN.  Pt ambulating independently and not in need of HHPT.  CM has requested specific wound dressing instructions as pt states she cannot reach back wound for dressing but has "friend" who can be taught by [facility]. Referral placed to [facility] rep, Tiffany.  No other CM needs were communicated. Dellie Catholic, RN 10/02/2015, 2:17 PM

## 2015-10-04 ENCOUNTER — Encounter (HOSPITAL_COMMUNITY): Payer: Self-pay | Admitting: Neurosurgery

## 2015-10-05 LAB — WOUND CULTURE

## 2015-10-06 LAB — ANAEROBIC CULTURE: GRAM STAIN: NONE SEEN

## 2015-10-20 NOTE — Addendum Note (Signed)
Addendum  created 10/20/15 1007 by Roberts Gaudy, MD   Modules edited: Anesthesia Responsible Staff

## 2015-10-22 NOTE — Addendum Note (Signed)
Addendum  created 10/22/15 1859 by Roberts Gaudy, MD   Modules edited: Notes Section   Notes Section:  Delete: [id number]; File: [id number]

## 2015-10-22 NOTE — Anesthesia Postprocedure Evaluation (Signed)
Anesthesia Post Note  Patient: Olivia Romero  Procedure(s) Performed: Procedure(s) (LRB): LUMBAR WOUND DEBRIDEMENT (N/A)  Patient location during evaluation: PACU Anesthesia Type: General Level of consciousness: awake, awake and alert and oriented Pain management: pain level controlled Vital Signs Assessment: post-procedure vital signs reviewed and stable Respiratory status: spontaneous breathing, nonlabored ventilation and respiratory function stable Cardiovascular status: blood pressure returned to baseline Anesthetic complications: no    Last Vitals:  Filed Vitals:   10/02/15 0520 10/02/15 1122  BP: 115/45 111/45  Pulse: 81 86  Temp: 36.8 C 36.6 C  Resp: 18 17    Last Pain:  Filed Vitals:   10/02/15 1403  PainSc: 0-No pain                 Almer Littleton COKER

## 2015-11-04 ENCOUNTER — Encounter: Payer: Self-pay | Admitting: Family Medicine

## 2015-11-04 ENCOUNTER — Ambulatory Visit (INDEPENDENT_AMBULATORY_CARE_PROVIDER_SITE_OTHER): Payer: 59 | Admitting: Family Medicine

## 2015-11-04 VITALS — BP 138/70 | HR 105 | Temp 98.8°F | Resp 18 | Ht 60.0 in | Wt 202.7 lb

## 2015-11-04 DIAGNOSIS — E781 Pure hyperglyceridemia: Secondary | ICD-10-CM

## 2015-11-04 DIAGNOSIS — E1143 Type 2 diabetes mellitus with diabetic autonomic (poly)neuropathy: Secondary | ICD-10-CM

## 2015-11-04 DIAGNOSIS — I10 Essential (primary) hypertension: Secondary | ICD-10-CM

## 2015-11-04 DIAGNOSIS — K3184 Gastroparesis: Secondary | ICD-10-CM

## 2015-11-04 MED ORDER — LISINOPRIL 40 MG PO TABS: 40.0000 mg | ORAL_TABLET | Freq: Every day | ORAL | Status: DC

## 2015-11-04 MED ORDER — ICOSAPENT ETHYL 1 G PO CAPS: 2000.0000 mg | ORAL_CAPSULE | Freq: Two times a day (BID) | ORAL | Status: DC

## 2015-11-04 MED ORDER — METOCLOPRAMIDE HCL 10 MG PO TABS: 10.0000 mg | ORAL_TABLET | Freq: Two times a day (BID) | ORAL | Status: DC | PRN

## 2015-11-04 NOTE — Progress Notes (Signed)
Name: Olivia Romero   MRN: [id number]    DOB: September 27, 1955   Date:11/04/2015       Progress Note  Subjective  Chief Complaint  Chief Complaint  Patient presents with  . Medication Refill    metoclopramide 10 mg / lisinopril 40 mg   . Diabetes    Hypertension This is a chronic problem. The problem is controlled. Pertinent negatives include no chest pain or headaches. Past treatments include ACE inhibitors. 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 There is no history of kidney disease or CAD/MI.  Hyperlipidemia This is a chronic problem. The problem is uncontrolled. Recent lipid tests were reviewed and are high. Pertinent negatives include no chest pain. Treatments tried: Fish oil Vascepa 1000mg  twice daily.   Gastroparesis:  Pt. Has been diagnosed with Gastroparesis, attributed to Diabetes. She has episodes of nausea and vomiting, severe at times. She has been taking Reglan prescribed by PCP, which helps to some extent.   Past Medical History  Diagnosis Date  . Hemorrhoid     internal  . Neuropathy ([place])   . Diabetes mellitus without complication ([place])   . Hyperlipemia   . Stroke-like episode ([place]) 2009    TIA  . Diverticulosis of colon   . Esophagitis   . Colon polyps     adenomatous  . Hypertension   . Carpal tunnel syndrome on right   . Asthma   . PONV (postoperative nausea and vomiting)   . Sleep apnea     has had in the past lost 50 pounds and do longer uses cpap  . Pneumonia   . GERD (gastroesophageal reflux disease)   . Arthritis   . Anemia     Past Surgical History  Procedure Laterality Date  . Tonsillectomy    . Abdominal hysterectomy    . Colonoscopy N/A 11/30/2012    Procedure: COLONOSCOPY;  Surgeon: Irene Shipper, MD;  Location: [facility] ENDOSCOPY;  Service: Endoscopy;  Laterality: N/A;  . Cesarean section  1986  . Esophagogastroduodenoscopy (egd) with propofol N/A 01/14/2015    Procedure: ESOPHAGOGASTRODUODENOSCOPY (EGD) WITH  PROPOFOL;  Surgeon: Manya Silvas, MD;  Location: [facility];  Service: Endoscopy;  Laterality: N/A;  . Savory dilation N/A 01/14/2015    Procedure: SAVORY DILATION;  Surgeon: Manya Silvas, MD;  Location: [facility] ENDOSCOPY;  Service: Endoscopy;  Laterality: N/A;  . Cervical fusion    . Lumbar wound debridement N/A 10/01/2015    Procedure: LUMBAR WOUND DEBRIDEMENT;  Surgeon: Ashok Pall, MD;  Location: [place]URO ORS;  Service: Neurosurgery;  Laterality: N/A;  LUMBAR WOUND DEBRIDEMENT  . Back surgery  09/02/2015    Family History  Problem Relation Age of Onset  . Lung cancer Father   . Hypertension Father   . Arthritis Father   . Other Mother     hardening of the arteries/renal cell carcenoma  . Hypertension Mother   . Stroke Mother   . Arthritis Brother   . Rheum arthritis Maternal Uncle     Social History   Social History  . Marital Status: Single    Spouse Name: N/A  . Number of Children: 1  . 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 Smoking status: Never Smoker   . Smokeless tobacco: Never Used  . Alcohol Use: 0.0 oz/week    0 Standard drinks or equivalent per week     Comment: wine  every 6-8 months  . Drug Use: No  . Sexual Activity: No   Other Topics Concern  . Not on file   Social History Narrative     Current outpatient prescriptions:  .  albuterol (PROVENTIL HFA;VENTOLIN HFA) 108 (90 BASE) MCG/ACT inhaler, Inhale 2 puffs into the lungs every 6 (six) hours as needed for wheezing or shortness of breath., Disp: 1 Inhaler, Rfl: 3 .  ALPRAZolam (XANAX) 0.5 MG tablet, Take 1 tablet (0.5 mg total) by mouth 2 (two) times daily as needed for anxiety., Disp: 30 tablet, Rfl: 2 .  aspirin 325 MG tablet, Take 325 mg by mouth daily., Disp: , Rfl:  .  Exenatide ER (BYDUREON) 2 MG PEN, Inject 1 pen into the skin every Monday. (Patient taking differently: Inject 1 pen into the skin every Friday. ), Disp:  1 each, Rfl: 3 .  gabapentin (NEURONTIN) 400 MG capsule, Take 400 mg by mouth 4 (four) times daily., Disp: , Rfl:  .  glimepiride (AMARYL) 2 MG tablet, , Disp: , Rfl:  .  Icosapent Ethyl (VASCEPA) 1 G CAPS, Take 1,000 mg by mouth 2 (two) times daily. , Disp: , Rfl:  .  lisinopril (PRINIVIL,ZESTRIL) 40 MG tablet, Take 1 tablet (40 mg total) by mouth daily., Disp: 30 tablet, Rfl: 0 .  metFORMIN (GLUCOPHAGE) 500 MG tablet, Take 2 tablets (1,000 mg total) by mouth 2 (two) times daily with a meal., Disp: 120 tablet, Rfl: 3 .  metoCLOPramide (REGLAN) 10 MG tablet, Take 10 mg by mouth every 6 (six) hours as needed for nausea or vomiting., Disp: , Rfl:  .  Multiple Vitamin (MULTIVITAMIN WITH MINERALS) TABS, Take 1 tablet by mouth daily. Reported on 07/21/2015, Disp: , Rfl:  .  omeprazole (PRILOSEC) 20 MG capsule, Take 20 mg by mouth daily., Disp: , Rfl:  .  polyethylene glycol powder (GLYCOLAX/MIRALAX) powder, Take 17 g by mouth daily., Disp: 3350 g, Rfl: 5 .  torsemide (DEMADEX) 20 MG tablet, Take 1 tablet (20 mg total) by mouth every other day., Disp: 15 tablet, Rfl: 2  Current facility-administered medications:  .  Exenatide ER PEN 1 pen, 1 pen, Subcutaneous, Once, Ashok Norris, MD  Allergies  Allergen Reactions  . Eggs Or Egg-Derived Products Other (See Comments)    Angioedema  . Atorvastatin Other (See Comments)    Liver toxicity  . Flu Virus Vaccine Other (See Comments)  . Latex Itching  . Pravastatin Itching and Rash    Review of Systems  Constitutional: Negative for fever and chills.  Cardiovascular: Negative for chest pain.  Gastrointestinal: Positive for nausea, vomiting and abdominal pain.  Neurological: Negative for headaches.    Objective  Filed Vitals:   11/04/15 0927  BP: 138/70  Pulse: 105  Temp: 98.8 F (37.1 C)  TempSrc: Oral  Resp: 18  Height: 5' (1.524 m)  Weight: 202 lb 11.2 oz (91.944 kg)  SpO2: 98%    Physical Exam  Constitutional: She is oriented to  person, place, and time and well-developed, well-nourished, and in no distress.  HENT:  Head: Normocephalic and atraumatic.  Cardiovascular: Normal heart sounds.  Tachycardia present.   Pulmonary/Chest: Breath sounds normal.  Abdominal: Soft. Bowel sounds are normal.  Neurological: She is alert and oriented to person, place, and time.  Nursing note and vitals reviewed.      Assessment & Plan  1. Essential hypertension, benign Blood pressure at goal, refill for lisinopril provided - lisinopril (PRINIVIL,ZESTRIL) 40 MG tablet; Take 1 tablet (40 mg  total) by mouth daily.  Dispense: 90 tablet; Refill: 0  2. Diabetic gastroparesis ([place]) She has been taking Reglan for nausea and vomiting, attributed to diabetic gastroparesis. Encouraged to follow up with gastroenterology - metoCLOPramide (REGLAN) 10 MG tablet; Take 1 tablet (10 mg total) by mouth 2 (two) times daily as needed for nausea or vomiting.  Dispense: 180 tablet; Refill: 0  3. Hypertriglyceridemia Increased Vascepa to the standard dose of 2 g by mouth twice a day, obtain triglyceride levels and follow-up - Lipid Profile - Comprehensive Metabolic Panel (CMET) - Icosapent Ethyl (VASCEPA) 1 g CAPS; Take 2,000 mg by mouth 2 (two) times daily.  Dispense: 120 capsule; Refill: 2   Edith Lord Asad A. [facility] 11/04/2015 9:39 AM

## 2015-11-15 ENCOUNTER — Other Ambulatory Visit: Payer: Self-pay | Admitting: Student

## 2015-11-15 DIAGNOSIS — R1084 Generalized abdominal pain: Secondary | ICD-10-CM

## 2015-11-25 ENCOUNTER — Ambulatory Visit
Admission: RE | Admit: 2015-11-25 | Discharge: 2015-11-25 | Disposition: A | Discharge status: Home / Self Care | Payer: 59 | Source: Ambulatory Visit | Attending: Student | Admitting: Student

## 2015-11-25 DIAGNOSIS — R1084 Generalized abdominal pain: Secondary | ICD-10-CM | POA: Insufficient documentation

## 2015-11-25 MED ORDER — TECHNETIUM TC 99M SULFUR COLLOID
2.0000 | Freq: Once | INTRAVENOUS | Status: AC | PRN
  Administered 2015-11-25: 2.18 via INTRAVENOUS

## 2015-11-30 ENCOUNTER — Emergency Department
Admission: EM | Admit: 2015-11-30 | Discharge: 2015-11-30 | Disposition: A | Discharge status: Home / Self Care | Payer: 59 | Attending: Emergency Medicine | Admitting: Emergency Medicine

## 2015-11-30 ENCOUNTER — Emergency Department: Payer: 59

## 2015-11-30 DIAGNOSIS — Z86010 Personal history of colonic polyps: Secondary | ICD-10-CM | POA: Diagnosis not present

## 2015-11-30 DIAGNOSIS — Z79899 Other long term (current) drug therapy: Secondary | ICD-10-CM | POA: Diagnosis not present

## 2015-11-30 DIAGNOSIS — Z7984 Long term (current) use of oral hypoglycemic drugs: Secondary | ICD-10-CM | POA: Diagnosis not present

## 2015-11-30 DIAGNOSIS — E785 Hyperlipidemia, unspecified: Secondary | ICD-10-CM | POA: Diagnosis not present

## 2015-11-30 DIAGNOSIS — Z8719 Personal history of other diseases of the digestive system: Secondary | ICD-10-CM | POA: Diagnosis not present

## 2015-11-30 DIAGNOSIS — Z7982 Long term (current) use of aspirin: Secondary | ICD-10-CM | POA: Diagnosis not present

## 2015-11-30 DIAGNOSIS — I10 Essential (primary) hypertension: Secondary | ICD-10-CM | POA: Diagnosis not present

## 2015-11-30 DIAGNOSIS — Z91040 Latex allergy status: Secondary | ICD-10-CM | POA: Insufficient documentation

## 2015-11-30 DIAGNOSIS — Z8673 Personal history of transient ischemic attack (TIA), and cerebral infarction without residual deficits: Secondary | ICD-10-CM | POA: Insufficient documentation

## 2015-11-30 DIAGNOSIS — M10071 Idiopathic gout, right ankle and foot: Secondary | ICD-10-CM | POA: Diagnosis not present

## 2015-11-30 DIAGNOSIS — E1143 Type 2 diabetes mellitus with diabetic autonomic (poly)neuropathy: Secondary | ICD-10-CM | POA: Diagnosis not present

## 2015-11-30 DIAGNOSIS — J4520 Mild intermittent asthma, uncomplicated: Secondary | ICD-10-CM | POA: Diagnosis not present

## 2015-11-30 DIAGNOSIS — M1990 Unspecified osteoarthritis, unspecified site: Secondary | ICD-10-CM | POA: Insufficient documentation

## 2015-11-30 DIAGNOSIS — L03116 Cellulitis of left lower limb: Secondary | ICD-10-CM | POA: Diagnosis not present

## 2015-11-30 DIAGNOSIS — Z91012 Allergy to eggs: Secondary | ICD-10-CM | POA: Diagnosis not present

## 2015-11-30 DIAGNOSIS — R2241 Localized swelling, mass and lump, right lower limb: Secondary | ICD-10-CM | POA: Diagnosis present

## 2015-11-30 LAB — CBC WITH DIFFERENTIAL/PLATELET
Basophils Absolute: 0.1 10*3/uL (ref 0–0.1)
Basophils Relative: 1 %
Eosinophils Absolute: 0.2 10*3/uL (ref 0–0.7)
Eosinophils Relative: 3 %
HEMATOCRIT: 36.2 % (ref 35.0–47.0)
HEMOGLOBIN: 12.3 g/dL (ref 12.0–16.0)
LYMPHS ABS: 1.9 10*3/uL (ref 1.0–3.6)
LYMPHS PCT: 28 %
MCH: 27.2 pg (ref 26.0–34.0)
MCHC: 34 g/dL (ref 32.0–36.0)
MCV: 79.9 fL — AB (ref 80.0–100.0)
MONOS PCT: 10 %
Monocytes Absolute: 0.7 10*3/uL (ref 0.2–0.9)
NEUTROS PCT: 58 %
Neutro Abs: 3.8 10*3/uL (ref 1.4–6.5)
Platelets: 175 10*3/uL (ref 150–440)
RBC: 4.53 MIL/uL (ref 3.80–5.20)
RDW: 14.4 % (ref 11.5–14.5)
WBC: 6.6 10*3/uL (ref 3.6–11.0)

## 2015-11-30 LAB — BASIC METABOLIC PANEL
Anion gap: 10 (ref 5–15)
BUN: 11 mg/dL (ref 6–20)
CHLORIDE: 102 mmol/L (ref 101–111)
CO2: 24 mmol/L (ref 22–32)
Calcium: 8.9 mg/dL (ref 8.9–10.3)
Creatinine, Ser: 0.59 mg/dL (ref 0.44–1.00)
GFR calc non Af Amer: >60 mL/min (ref >60)
Glucose, Bld: 122 mg/dL — ABNORMAL HIGH (ref 65–99)
POTASSIUM: 4.4 mmol/L (ref 3.5–5.1)
SODIUM: 136 mmol/L (ref 135–145)

## 2015-11-30 LAB — URIC ACID: URIC ACID, SERUM: 8.2 mg/dL — AB (ref 2.3–6.6)

## 2015-11-30 MED ORDER — COLCHICINE 0.6 MG PO TABS: 0.6000 mg | ORAL_TABLET | Freq: Two times a day (BID) | ORAL | Status: DC

## 2015-11-30 MED ORDER — CLINDAMYCIN PHOSPHATE 600 MG/50ML IV SOLN
600.0000 mg | Freq: Once | INTRAVENOUS | Status: AC
Start: 2015-11-30 — End: 2015-11-30
  Administered 2015-11-30: 600 mg via INTRAVENOUS
  Filled 2015-11-30: qty 50

## 2015-11-30 MED ORDER — COLCHICINE 0.6 MG PO TABS
1.2000 mg | ORAL_TABLET | Freq: Once | ORAL | Status: AC
  Administered 2015-11-30: 1.2 mg via ORAL
  Filled 2015-11-30: qty 2

## 2015-11-30 MED ORDER — CLINDAMYCIN HCL 300 MG PO CAPS: 300.0000 mg | ORAL_CAPSULE | Freq: Three times a day (TID) | ORAL | Status: DC

## 2015-11-30 NOTE — Discharge Instructions (Signed)
You were evaluated for right foot and leg swelling with redness, and I suspect the underlying causes gout. I am also cover you for possible skin infection called cellulitis with antibiotic clindamycin.  Return to emergency room for any worsening condition including any worsening redness, pain, fevers, nausea, dizziness or passing out, or any other symptoms concerning to you.   Gout Gout is an inflammatory arthritis caused by a buildup of uric acid crystals in the joints. Uric acid is a chemical that is normally present in the blood. When the level of uric acid in the blood is too high it can form crystals that deposit in your joints and tissues. This causes joint redness, soreness, and swelling (inflammation). Repeat attacks are common. Over time, uric acid crystals can form into masses (tophi) near a joint, destroying bone and causing disfigurement. Gout is treatable and often preventable. CAUSES  The disease begins with elevated levels of uric acid in the blood. Uric acid is produced by your body when it breaks down a naturally found substance called purines. Certain foods you eat, such as meats and fish, contain high amounts of purines. Causes of an elevated uric acid level include:  Being passed down from parent to child (heredity).  Diseases that cause increased uric acid production (such as obesity, psoriasis, and certain cancers).  Excessive alcohol use.  Diet, especially diets rich in meat and seafood.  Medicines, including certain cancer-fighting medicines (chemotherapy), water pills (diuretics), and aspirin.  Chronic kidney disease. The kidneys are no longer able to remove uric acid well.  Problems with metabolism. Conditions strongly associated with gout include:  Obesity.  High blood pressure.  High cholesterol.  Diabetes. Not everyone with elevated uric acid levels gets gout. It is not understood why some people get gout and others do not. Surgery, joint injury, and  eating too much of certain foods are some of the factors that can lead to gout attacks. SYMPTOMS   An attack of gout comes on quickly. It causes intense pain with redness, swelling, and warmth in a joint.  Fever can occur.  Often, only one joint is involved. Certain joints are more commonly involved:  Base of the big toe.  Knee.  Ankle.  Wrist.  Finger. Without treatment, an attack usually goes away in a few days to weeks. Between attacks, you usually will not have symptoms, which is different from many other forms of arthritis. DIAGNOSIS  Your caregiver will suspect gout based on your symptoms and exam. In some cases, tests may be recommended. The tests may include:  Blood tests.  Urine tests.  X-rays.  Joint fluid exam. This exam requires a needle to remove fluid from the joint (arthrocentesis). Using a microscope, gout is confirmed when uric acid crystals are seen in the joint fluid. TREATMENT  There are two phases to gout treatment: treating the sudden onset (acute) attack and preventing attacks (prophylaxis).  Treatment of an Acute Attack.  Medicines are used. These include anti-inflammatory medicines or steroid medicines.  An injection of steroid medicine into the affected joint is sometimes necessary.  The painful joint is rested. Movement can worsen the arthritis.  You may use warm or cold treatments on painful joints, depending which works best for you.  Treatment to Prevent Attacks.  If you suffer from frequent gout attacks, your caregiver may advise preventive medicine. These medicines are started after the acute attack subsides. These medicines either help your kidneys eliminate uric acid from your body or decrease your uric acid  production. You may need to stay on these medicines for a very long time.  The early phase of treatment with preventive medicine can be associated with an increase in acute gout attacks. For this reason, during the first few months  of treatment, your caregiver may also advise you to take medicines usually used for acute gout treatment. Be sure you understand your caregiver's directions. Your caregiver may make several adjustments to your medicine dose before these medicines are effective.  Discuss dietary treatment with your caregiver or dietitian. Alcohol and drinks high in sugar and fructose and foods such as meat, poultry, and seafood can increase uric acid levels. Your caregiver or dietitian can advise you on drinks and foods that should be limited. HOME CARE INSTRUCTIONS   Do not take aspirin to relieve pain. This raises uric acid levels.  Only take over-the-counter or prescription medicines for pain, discomfort, or fever as directed by your caregiver.  Rest the joint as much as possible. When in bed, keep sheets and blankets off painful areas.  Keep the affected joint raised (elevated).  Apply warm or cold treatments to painful joints. Use of warm or cold treatments depends on which works best for you.  Use crutches if the painful joint is in your leg.  Drink enough fluids to keep your urine clear or pale yellow. This helps your body get rid of uric acid. Limit alcohol, sugary drinks, and fructose drinks.  Follow your dietary instructions. Pay careful attention to the amount of protein you eat. Your daily diet should emphasize fruits, vegetables, whole grains, and fat-free or low-fat milk products. Discuss the use of coffee, vitamin C, and cherries with your caregiver or dietitian. These may be helpful in lowering uric acid levels.  Maintain a healthy body weight. SEEK MEDICAL CARE IF:   You develop diarrhea, vomiting, or any side effects from medicines.  You do not feel better in 24 hours, or you are getting worse. SEEK IMMEDIATE MEDICAL CARE IF:   Your joint becomes suddenly more tender, and you have chills or a fever. MAKE SURE YOU:   Understand these instructions.  Will watch your condition.  Will  get help right away if you are not doing well or get worse.   This information is not intended to replace advice given to you by your health care provider. Make sure you discuss any questions you have with your health care provider.   Document Released: 05/19/2000 Document Revised: 06/12/2014 Document Reviewed: 01/03/2012 Elsevier Interactive Patient Education 2016 [employer]  Cellulitis Cellulitis is an infection of the skin and the tissue beneath it. The infected area is usually red and tender. Cellulitis occurs most often in the arms and lower legs.  CAUSES  Cellulitis is caused by bacteria that enter the skin through cracks or cuts in the skin. The most common types of bacteria that cause cellulitis are staphylococci and streptococci. SIGNS AND SYMPTOMS   Redness and warmth.  Swelling.  Tenderness or pain.  Fever. DIAGNOSIS  Your health care provider can usually determine what is wrong based on a physical exam. Blood tests may also be done. TREATMENT  Treatment usually involves taking an antibiotic medicine. HOME CARE INSTRUCTIONS   Take your antibiotic medicine as directed by your health care provider. Finish the antibiotic even if you start to feel better.  Keep the infected arm or leg elevated to reduce swelling.  Apply a warm cloth to the affected area up to 4 times per day to relieve pain.  Take medicines only as directed by your health care provider.  Keep all follow-up visits as directed by your health care provider. SEEK MEDICAL CARE IF:   You notice red streaks coming from the infected area.  Your red area gets larger or turns dark in color.  Your bone or joint underneath the infected area becomes painful after the skin has healed.  Your infection returns in the same area or another area.  You notice a swollen bump in the infected area.  You develop new symptoms.  You have a fever. SEEK IMMEDIATE MEDICAL CARE IF:   You feel very sleepy.  You  develop vomiting or diarrhea.  You have a general ill feeling (malaise) with muscle aches and pains.   This information is not intended to replace advice given to you by your health care provider. Make sure you discuss any questions you have with your health care provider.   Document Released: 03/01/2005 Document Revised: 02/10/2015 Document Reviewed: 08/07/2011 Elsevier Interactive Patient Education [employer].

## 2015-11-30 NOTE — ED Notes (Signed)
Patient transported to Ultrasound 

## 2015-11-30 NOTE — ED Provider Notes (Signed)
[facility] Emergency Department Provider Note   ____________________________________________  Time seen: Approximately 11:15 AM I have reviewed the triage vital signs and the triage nursing note.  HISTORY  Chief Complaint Foot Pain and Cellulitis   Historian Patient  HPI Olivia Romero is a 60 y.o. female with a history of diabetes, peripheral neuropathy, and gout is here with right leg and foot swelling and some pain.  She doesn't have good sensation to her lower extremities due to the diabetic peripheral neuropathy, but she has had some shooting/sharp pain when she puts weight on that foot and feels in the foot and to the toes. She noticed on Saturday that the second, third, and fourth toes were red, and the swelling seems to have progressed up her leg.  She does report having had gout in the past, but she states that her peripheral neuropathy is much worse and she really can't tell whether or not this feels similar not.  Denies fevers, trouble breathing, vomiting, headache, dizziness or passing out.    Past Medical History  Diagnosis Date  . Hemorrhoid     internal  . Neuropathy ([place])   . Diabetes mellitus without complication ([place])   . Hyperlipemia   . Stroke-like episode ([place]) 2009    TIA  . Diverticulosis of colon   . Esophagitis   . Colon polyps     adenomatous  . Hypertension   . Carpal tunnel syndrome on right   . Asthma   . PONV (postoperative nausea and vomiting)   . Sleep apnea     has had in the past lost 50 pounds and do longer uses cpap  . Pneumonia   . GERD (gastroesophageal reflux disease)   . Arthritis   . Anemia     Patient Active Problem List   Diagnosis Date Noted  . Wound dehiscence 10/01/2015  . Lumbar stenosis with neurogenic claudication 09/02/2015  . Spinal stenosis 08/11/2015  . 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 Chronic constipation 07/13/2015  . OSA (obstructive  sleep apnea) 06/25/2015  . Diabetic gastroparesis ([place]) 04/23/2015  . 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/23/2015  . Acute anxiety 04/23/2015  . Hypertriglyceridemia 04/23/2015  . Statin intolerance 04/23/2015  . Asthma, mild intermittent 02/01/2015  . Type 2 diabetes mellitus with diabetic nephropathy ([place]) 12/31/2014  . Esophagitis 12/31/2014  . Dysphagia 12/31/2014  . Acid reflux 12/29/2014  . Sinus tachycardia ([place]) 12/03/2014  . Bilateral carpal tunnel syndrome 12/01/2014  . DM neuropathy, type II diabetes mellitus ([place]) 12/01/2014  . Nausea 12/01/2014  . Benign neoplasm of colon 11/30/2012  . Ischemic colitis ([place]) 11/30/2012  . Diverticulosis of colon (without mention of hemorrhage) 11/30/2012  . Bright red blood per rectum 11/28/2012    Class: Acute  . Essential hypertension, benign 11/28/2012  . Malnutrition of moderate degree ([place]) 11/28/2012    Past Surgical History  Procedure Laterality Date  . Tonsillectomy    . Abdominal hysterectomy    . Colonoscopy N/A 11/30/2012    Procedure: COLONOSCOPY;  Surgeon: Irene Shipper, MD;  Location: [facility] ENDOSCOPY;  Service: Endoscopy;  Laterality: N/A;  . Cesarean section  1986  . Esophagogastroduodenoscopy (egd) with propofol N/A 01/14/2015    Procedure: ESOPHAGOGASTRODUODENOSCOPY (EGD) WITH PROPOFOL;  Surgeon: Manya Silvas, MD;  Location: [place];  Service: Endoscopy;  Laterality: N/A;  . Savory dilation N/A 01/14/2015    Procedure: SAVORY DILATION;  Surgeon: Manya Silvas, MD;  Location: [facility] ENDOSCOPY;  Service: Endoscopy;  Laterality: N/A;  . Cervical fusion    . Lumbar wound debridement N/A 10/01/2015    Procedure: LUMBAR WOUND DEBRIDEMENT;  Surgeon: Ashok Pall, MD;  Location: [facility]S;  Service: Neurosurgery;  Laterality: N/A;  LUMBAR WOUND DEBRIDEMENT  . Back surgery  09/02/2015    Current Outpatient Rx  Name  Route  Sig  Dispense  Refill  . albuterol (PROVENTIL HFA;VENTOLIN HFA) 108 (90  BASE) MCG/ACT inhaler   Inhalation   Inhale 2 puffs into the lungs every 6 (six) hours as needed for wheezing or shortness of breath.   1 Inhaler   3   . ALPRAZolam (XANAX) 0.5 MG tablet   Oral   Take 1 tablet (0.5 mg total) by mouth 2 (two) times daily as needed for anxiety.   30 tablet   2   . aspirin 325 MG tablet   Oral   Take 325 mg by mouth daily.         . clindamycin (CLEOCIN) 300 MG capsule   Oral   Take 1 capsule (300 mg total) by mouth 3 (three) times daily.   30 capsule   0   . colchicine 0.6 MG tablet   Oral   Take 1 tablet (0.6 mg total) by mouth 2 (two) times daily.   30 tablet   2   . Exenatide ER (BYDUREON) 2 MG PEN   Subcutaneous   Inject 1 pen into the skin every Monday. Patient taking differently: Inject 1 pen into the skin every Friday.    1 each   3   . gabapentin (NEURONTIN) 400 MG capsule   Oral   Take 400 mg by mouth 4 (four) times daily.         [name] glimepiride (AMARYL) 2 MG tablet               . Icosapent Ethyl (VASCEPA) 1 g CAPS   Oral   Take 2,000 mg by mouth 2 (two) times daily.   120 capsule   2   . lisinopril (PRINIVIL,ZESTRIL) 40 MG tablet   Oral   Take 1 tablet (40 mg total) by mouth daily.   90 tablet   0   . metFORMIN (GLUCOPHAGE) 500 MG tablet   Oral   Take 2 tablets (1,000 mg total) by mouth 2 (two) times daily with a meal.   120 tablet   3   . metoCLOPramide (REGLAN) 10 MG tablet   Oral   Take 1 tablet (10 mg total) by mouth 2 (two) times daily as needed for nausea or vomiting.   180 tablet   0   . Multiple Vitamin (MULTIVITAMIN WITH MINERALS) TABS   Oral   Take 1 tablet by mouth daily. Reported on 07/21/2015         . omeprazole (PRILOSEC) 20 MG capsule   Oral   Take 20 mg by mouth daily.         . polyethylene glycol powder (GLYCOLAX/MIRALAX) powder   Oral   Take 17 g by mouth daily.   3350 g   5   . torsemide (DEMADEX) 20 MG tablet   Oral   Take 1 tablet (20 mg total) by mouth every  other day.   15 tablet   2     Allergies Eggs or egg-derived products; Atorvastatin; Flu virus vaccine; Latex; and Pravastatin  Family History  Problem Relation Age of Onset  . Lung cancer Father   . Hypertension Father   .  Arthritis Father   . Other Mother     hardening of the arteries/renal cell carcenoma  . Hypertension Mother   . Stroke Mother   . Arthritis Brother   . Rheum arthritis Maternal Uncle     Social History Social History  Substance Use Topics  . Smoking status: Never Smoker   . Smokeless tobacco: Never Used  . Alcohol Use: 0.0 oz/week    0 Standard drinks or equivalent per week     Comment: wine every 6-8 months    Review of Systems  Constitutional: Negative for fever. Eyes: Negative for visual changes. ENT: Negative for sore throat. Cardiovascular: Negative for chest pain. Respiratory: Negative for shortness of breath. Gastrointestinal: Negative for abdominal pain, vomiting and diarrhea. Genitourinary: Negative for dysuria. Musculoskeletal: Negative for back pain. Skin:Redness to rle Neurological: Negative for headache. 10 point Review of Systems otherwise negative ____________________________________________   PHYSICAL EXAM:  VITAL SIGNS: ED Triage Vitals  Enc Vitals Group     BP 11/30/15 1112 110/49 mmHg     Pulse Rate 11/30/15 1112 81     Resp 11/30/15 1112 18     Temp 11/30/15 1112 98.7 F (37.1 C)     Temp Source 11/30/15 1112 Oral     SpO2 11/30/15 1112 98 %     Weight 11/30/15 1112 200 lb (90.719 kg)     Height 11/30/15 1112 5' (1.524 m)     Head Cir --      Peak Flow --      Pain Score 11/30/15 1113 6     Pain Loc --      Pain Edu? --      Excl. in [place]? --      Constitutional: Alert and oriented. Well appearing and in no distress. HEENT   Head: Normocephalic and atraumatic.      Eyes: Conjunctivae are normal. PERRL. Normal extraocular movements.      Ears:         Nose: No congestion/rhinnorhea.   Mouth/Throat:  Mucous membranes are moist.   Neck: No stridor. Cardiovascular/Chest: Normal rate, regular rhythm.  No murmurs, rubs, or gallops. Respiratory: Normal respiratory effort without tachypnea nor retractions. Breath sounds are clear and equal bilaterally. No wheezes/rales/rhonchi. Gastrointestinal: Soft. No distention, no guarding, no rebound. Nontender.  Obese  Genitourinary/rectal:Deferred Musculoskeletal: Nontender with normal range of motion in all extremities. No joint effusions.  No lower extremity tenderness.  No edema. Neurologic:  Normal speech and language. No weakness.  Decreased sensation bilateral lower extremities Skin:  Skin is warm, dry and intact. No rash noted. Psychiatric: Mood and affect are normal. Speech and behavior are normal. Patient exhibits appropriate insight and judgment.  ____________________________________________   EKG I, Lisa Roca, MD, the attending physician have personally viewed and interpreted all ECGs. None ____________________________________________  LABS (pertinent positives/negatives)  Labs Reviewed  BASIC METABOLIC PANEL - Abnormal; Notable for the following:    Glucose, Bld 122 (*)    All other components within normal limits  CBC WITH DIFFERENTIAL/PLATELET - Abnormal; Notable for the following:    MCV 79.9 (*)    All other components within normal limits  URIC ACID - Abnormal; Notable for the following:    Uric Acid, Serum 8.2 (*)    All other components within normal limits    ____________________________________________  RADIOLOGY All Xrays were viewed by me. Imaging interpreted by Radiologist.  RLE [place]:   IMPRESSION: Negative for deep venous thrombosis in right lower extremity. __________________________________________  PROCEDURES  Procedure(s) performed: None  Critical Care performed: None  ____________________________________________   ED COURSE / ASSESSMENT AND PLAN  Pertinent labs & imaging results that were  available during my care of the patient were reviewed by me and considered in my medical decision making (see chart for details).    The right leg is more swollen than the left from foot to knee with some erythema toes, bottom of foot and up the leg mildly.  This could be a cellulitis, although clinically the pattern of the redness seems more consistent with gout in my experience.  Additionally due to the extra swelling the calf I will [place] to ro right DVT.  Ultrasound negative for DVT.  CBC shows a normal white blood cell count with no left shift. Her uric acid is elevated. Again my clinical suspicion is underlying gout, but given the amount of redness and extension I am going to cover her for possible coinciding cellulitis.    CONSULTATIONS:   None   Patient / Family / Caregiver informed of clinical course, medical decision-making process, and agree with plan.   I discussed return precautions, follow-up instructions, and discharged instructions with patient and/or family.   ___________________________________________   FINAL CLINICAL IMPRESSION(S) / ED DIAGNOSES   Final diagnoses:  Cellulitis of left lower extremity  Acute idiopathic gout of right foot              Note: This dictation was prepared with Dragon dictation. Any transcriptional errors that result from this process are unintentional   Lisa Roca, MD 11/30/15 1339

## 2015-11-30 NOTE — ED Notes (Signed)
EDP at bedside  

## 2015-11-30 NOTE — ED Notes (Signed)
Pt returned from US, resting in bed in no distress 

## 2015-11-30 NOTE — ED Notes (Signed)
Pt c/o swelling, redness and pain to the right foot and 2nd, 3rd and 4th toe on the right foot since Saturday with a Hx of diabetic neuropathy

## 2015-12-02 ENCOUNTER — Other Ambulatory Visit: Payer: Self-pay | Admitting: Student

## 2015-12-02 DIAGNOSIS — R195 Other fecal abnormalities: Secondary | ICD-10-CM

## 2015-12-02 DIAGNOSIS — R1084 Generalized abdominal pain: Secondary | ICD-10-CM

## 2015-12-02 DIAGNOSIS — R112 Nausea with vomiting, unspecified: Secondary | ICD-10-CM

## 2015-12-08 ENCOUNTER — Ambulatory Visit
Admission: RE | Admit: 2015-12-08 | Discharge: 2015-12-08 | Disposition: A | Discharge status: Home / Self Care | Payer: 59 | Source: Ambulatory Visit | Attending: Student | Admitting: Student

## 2015-12-08 DIAGNOSIS — R1084 Generalized abdominal pain: Secondary | ICD-10-CM | POA: Diagnosis present

## 2015-12-08 DIAGNOSIS — K760 Fatty (change of) liver, not elsewhere classified: Secondary | ICD-10-CM | POA: Diagnosis not present

## 2015-12-08 DIAGNOSIS — R112 Nausea with vomiting, unspecified: Secondary | ICD-10-CM

## 2015-12-08 DIAGNOSIS — K7689 Other specified diseases of liver: Secondary | ICD-10-CM | POA: Diagnosis not present

## 2015-12-08 DIAGNOSIS — K5730 Diverticulosis of large intestine without perforation or abscess without bleeding: Secondary | ICD-10-CM | POA: Insufficient documentation

## 2015-12-08 DIAGNOSIS — R195 Other fecal abnormalities: Secondary | ICD-10-CM | POA: Diagnosis present

## 2015-12-08 MED ORDER — IOPAMIDOL (ISOVUE-300) INJECTION 61%
100.0000 mL | Freq: Once | INTRAVENOUS | Status: AC | PRN
  Administered 2015-12-08: 100 mL via INTRAVENOUS

## 2015-12-13 ENCOUNTER — Ambulatory Visit (INDEPENDENT_AMBULATORY_CARE_PROVIDER_SITE_OTHER): Payer: 59 | Admitting: Family Medicine

## 2015-12-13 ENCOUNTER — Encounter: Payer: Self-pay | Admitting: Family Medicine

## 2015-12-13 VITALS — BP 130/80 | HR 102 | Temp 98.4°F | Resp 16 | Ht 60.0 in | Wt 198.9 lb

## 2015-12-13 DIAGNOSIS — E1142 Type 2 diabetes mellitus with diabetic polyneuropathy: Secondary | ICD-10-CM | POA: Diagnosis not present

## 2015-12-13 DIAGNOSIS — R0989 Other specified symptoms and signs involving the circulatory and respiratory systems: Secondary | ICD-10-CM

## 2015-12-13 NOTE — Progress Notes (Signed)
Name: Olivia Romero   MRN: [id number]    DOB: 15-Aug-1955   Date:12/13/2015       Progress Note  Subjective  Chief Complaint  Chief Complaint  Patient presents with  . Diabetic Neuropathy     follow up    HPI  Diabetic Neuropathy: Pt. Has recently been started on Cymbalta 20 mg daily by neurologist, she has experienced worsening of neuropathic symptoms (fingers feel numb, sharp pain in feet along with numbness). Sometimes she cannot feel water on her lower legs and thighs when in shower. She has long-standing history of Diabetes, first diagnosed in 2004, initially managed by PCP (Dr. Rutherford Nail, now by Endocrinologist). Patient visit she is no longer able to work because of worsening neuropathy in hands (her work requires frequent use of hands).  Past Medical History  Diagnosis Date  . Hemorrhoid     internal  . Neuropathy ([place])   . Diabetes mellitus without complication ([place])   . Hyperlipemia   . Stroke-like episode ([place]) 2009    TIA  . Diverticulosis of colon   . Esophagitis   . Colon polyps     adenomatous  . Hypertension   . Carpal tunnel syndrome on right   . Asthma   . PONV (postoperative nausea and vomiting)   . Sleep apnea     has had in the past lost 50 pounds and do longer uses cpap  . Pneumonia   . GERD (gastroesophageal reflux disease)   . Arthritis   . Anemia     Past Surgical History  Procedure Laterality Date  . Tonsillectomy    . Abdominal hysterectomy    . Colonoscopy N/A 11/30/2012    Procedure: COLONOSCOPY;  Surgeon: Irene Shipper, MD;  Location: [facility] ENDOSCOPY;  Service: Endoscopy;  Laterality: N/A;  . Cesarean section  1986  . Esophagogastroduodenoscopy (egd) with propofol N/A 01/14/2015    Procedure: ESOPHAGOGASTRODUODENOSCOPY (EGD) WITH PROPOFOL;  Surgeon: Manya Silvas, MD;  Location: [facility];  Service: Endoscopy;  Laterality: N/A;  . Savory dilation N/A 01/14/2015    Procedure: SAVORY DILATION;  Surgeon: Manya Silvas, MD;   Location: [facility] ENDOSCOPY;  Service: Endoscopy;  Laterality: N/A;  . Cervical fusion    . Lumbar wound debridement N/A 10/01/2015    Procedure: LUMBAR WOUND DEBRIDEMENT;  Surgeon: Ashok Pall, MD;  Location: [place] NEURO ORS;  Service: Neurosurgery;  Laterality: N/A;  LUMBAR WOUND DEBRIDEMENT  . Back surgery  09/02/2015    Family History  Problem Relation Age of Onset  . Lung cancer Father   . Hypertension Father   . Arthritis Father   . Other Mother     hardening of the arteries/renal cell carcenoma  . Hypertension Mother   . Stroke Mother   . Arthritis Brother   . Rheum arthritis Maternal Uncle     Social History   Social History  . Marital Status: Single    Spouse Name: N/A  . Number of Children: 1  . 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 Smoking status: Never Smoker   . Smokeless tobacco: Never Used  . Alcohol Use: 0.0 oz/week    0 Standard drinks or equivalent per week     Comment: wine every 6-8 months  . Drug Use: No  . Sexual Activity: No   Other Topics Concern  . Not on file   Social History Narrative  Current outpatient prescriptions:  .  acetaminophen (TYLENOL) 325 MG tablet, Take by mouth., Disp: , Rfl:  .  albuterol (PROVENTIL HFA;VENTOLIN HFA) 108 (90 BASE) MCG/ACT inhaler, Inhale 2 puffs into the lungs every 6 (six) hours as needed for wheezing or shortness of breath., Disp: 1 Inhaler, Rfl: 3 .  ALPRAZolam (XANAX) 0.5 MG tablet, Take 1 tablet (0.5 mg total) by mouth 2 (two) times daily as needed for anxiety., Disp: 30 tablet, Rfl: 2 .  aspirin 325 MG tablet, Take 325 mg by mouth daily., Disp: , Rfl:  .  clindamycin (CLEOCIN) 300 MG capsule, Take 1 capsule (300 mg total) by mouth 3 (three) times daily., Disp: 30 capsule, Rfl: 0 .  colchicine 0.6 MG tablet, Take 1 tablet (0.6 mg total) by mouth 2 (two) times daily., Disp: 30 tablet, Rfl: 2 .  DULoxetine (CYMBALTA) 20 MG capsule, Take  by mouth., Disp: , Rfl:  .  glimepiride (AMARYL) 2 MG tablet, , Disp: , Rfl:  .  HYDROcodone-acetaminophen (NORCO/VICODIN) 5-325 MG tablet, Take by mouth., Disp: , Rfl:  .  hydrocortisone (ANUSOL-HC) 2.5 % rectal cream, Apply topically., Disp: , Rfl:  .  hyoscyamine (LEVSIN SL) 0.125 MG SL tablet, Place under the tongue., Disp: , Rfl:  .  Icosapent Ethyl (VASCEPA) 1 g CAPS, Take 2,000 mg by mouth 2 (two) times daily., Disp: 120 capsule, Rfl: 2 .  lisinopril (PRINIVIL,ZESTRIL) 40 MG tablet, Take 1 tablet (40 mg total) by mouth daily., Disp: 90 tablet, Rfl: 0 .  meloxicam (MOBIC) 15 MG tablet, Take by mouth., Disp: , Rfl:  .  metFORMIN (GLUCOPHAGE) 500 MG tablet, Take 2 tablets (1,000 mg total) by mouth 2 (two) times daily with a meal., Disp: 120 tablet, Rfl: 3 .  metoCLOPramide (REGLAN) 10 MG tablet, Take 1 tablet (10 mg total) by mouth 2 (two) times daily as needed for nausea or vomiting., Disp: 180 tablet, Rfl: 0 .  Multiple Vitamin (MULTIVITAMIN WITH MINERALS) TABS, Take 1 tablet by mouth daily. Reported on 07/21/2015, Disp: , Rfl:  .  omeprazole (PRILOSEC) 20 MG capsule, Take 20 mg by mouth daily., Disp: , Rfl:  .  pantoprazole (PROTONIX) 40 MG tablet, Take by mouth., Disp: , Rfl:  .  polyethylene glycol powder (GLYCOLAX/MIRALAX) powder, Take 17 g by mouth daily., Disp: 3350 g, Rfl: 5 .  Exenatide ER (BYDUREON) 2 MG PEN, Inject 1 pen into the skin every Monday. (Patient not taking: Reported on 12/13/2015), Disp: 1 each, Rfl: 3 .  gabapentin (NEURONTIN) 400 MG capsule, Take 400 mg by mouth 4 (four) times daily. Reported on 12/13/2015, Disp: , Rfl:  .  torsemide (DEMADEX) 20 MG tablet, Take 1 tablet (20 mg total) by mouth every other day., Disp: 15 tablet, Rfl: 2  Current facility-administered medications:  .  Exenatide ER PEN 1 pen, 1 pen, Subcutaneous, Once, Ashok Norris, MD  Allergies  Allergen Reactions  . Eggs Or Egg-Derived Products Other (See Comments)    Angioedema  . Atorvastatin  Other (See Comments)    Liver toxicity  . Flu Virus Vaccine Other (See Comments)  . Latex Itching  . Pravastatin Itching and Rash     Review of Systems  Musculoskeletal: Positive for myalgias.  Neurological: Positive for tingling and sensory change.     Objective  Filed Vitals:   12/13/15 1527  BP: 130/80  Pulse: 102  Temp: 98.4 F (36.9 C)  TempSrc: Oral  Resp: 16  Height: 5' (1.524 m)  Weight: 198 lb 14.4 oz (  90.22 kg)  SpO2: 97%    Physical Exam  Constitutional: She is oriented to person, place, and time and well-developed, well-nourished, and in no distress.  HENT:  Head: Normocephalic and atraumatic.  Cardiovascular: Normal rate, regular rhythm and normal heart sounds.   No murmur heard. Pulmonary/Chest: Effort normal and breath sounds normal. She has no wheezes.  Abdominal: Soft. Bowel sounds are normal. There is no tenderness.  Musculoskeletal:       Right foot: There is decreased range of motion. There is no tenderness, no swelling and no deformity.       Left foot: There is decreased range of motion. There is no tenderness, no swelling and no deformity.  Decreased ROM of right and left toes. Absent sensation to monofilament, especially on the distal feet including the toes  Neurological: She is alert and oriented to person, place, and time.  Psychiatric: Mood, memory, affect and judgment normal.  Nursing note and vitals reviewed.      Assessment & Plan  1. Diabetic peripheral neuropathy associated with type 2 diabetes mellitus ([place]) Recently started on Cymbalta as add-on therapy to gabapentin by a neurologist. Continue the same. - Ambulatory referral to Podiatry  2. Absent peripheral pulse Rule out peripheral vascular disease, right foot with no palpable pulse. - Ambulatory referral to Vascular Surgery   Davida Falconi [facility] 12/13/2015 4:50 PM

## 2015-12-15 ENCOUNTER — Telehealth: Payer: Self-pay | Admitting: Family Medicine

## 2015-12-15 NOTE — Telephone Encounter (Signed)
Pt would like to know if disability forms are completed? Pt brought the main form in on Monday 12/13/2015. Please advise.

## 2015-12-15 NOTE — Telephone Encounter (Signed)
Provided the office visit note from Monday, July 10 to be taken for review by disability determination services. Explained that we'll not be able to fill out some paperwork and will need to be referred for functional capacity evaluation

## 2015-12-31 ENCOUNTER — Encounter: Payer: Self-pay | Admitting: *Deleted

## 2016-01-03 ENCOUNTER — Ambulatory Visit
Admission: RE | Admit: 2016-01-03 | Discharge: 2016-01-03 | Disposition: A | Discharge status: Home / Self Care | Payer: 59 | Source: Ambulatory Visit | Attending: Unknown Physician Specialty | Admitting: Unknown Physician Specialty

## 2016-01-03 ENCOUNTER — Ambulatory Visit: Payer: 59 | Admitting: Anesthesiology

## 2016-01-03 ENCOUNTER — Encounter: Payer: Self-pay | Admitting: *Deleted

## 2016-01-03 ENCOUNTER — Encounter
Admission: RE | Disposition: A | Discharge status: Home / Self Care | Payer: Self-pay | Source: Ambulatory Visit | Attending: Unknown Physician Specialty

## 2016-01-03 DIAGNOSIS — E119 Type 2 diabetes mellitus without complications: Secondary | ICD-10-CM | POA: Diagnosis not present

## 2016-01-03 DIAGNOSIS — R1084 Generalized abdominal pain: Secondary | ICD-10-CM | POA: Diagnosis present

## 2016-01-03 DIAGNOSIS — Z79899 Other long term (current) drug therapy: Secondary | ICD-10-CM | POA: Diagnosis not present

## 2016-01-03 DIAGNOSIS — Z862 Personal history of diseases of the blood and blood-forming organs and certain disorders involving the immune mechanism: Secondary | ICD-10-CM | POA: Diagnosis not present

## 2016-01-03 DIAGNOSIS — F419 Anxiety disorder, unspecified: Secondary | ICD-10-CM | POA: Diagnosis not present

## 2016-01-03 DIAGNOSIS — K5730 Diverticulosis of large intestine without perforation or abscess without bleeding: Secondary | ICD-10-CM | POA: Insufficient documentation

## 2016-01-03 DIAGNOSIS — G4730 Sleep apnea, unspecified: Secondary | ICD-10-CM | POA: Insufficient documentation

## 2016-01-03 DIAGNOSIS — E785 Hyperlipidemia, unspecified: Secondary | ICD-10-CM | POA: Insufficient documentation

## 2016-01-03 DIAGNOSIS — D125 Benign neoplasm of sigmoid colon: Secondary | ICD-10-CM | POA: Insufficient documentation

## 2016-01-03 DIAGNOSIS — J45909 Unspecified asthma, uncomplicated: Secondary | ICD-10-CM | POA: Diagnosis not present

## 2016-01-03 DIAGNOSIS — K6389 Other specified diseases of intestine: Secondary | ICD-10-CM | POA: Insufficient documentation

## 2016-01-03 DIAGNOSIS — Z7984 Long term (current) use of oral hypoglycemic drugs: Secondary | ICD-10-CM | POA: Insufficient documentation

## 2016-01-03 DIAGNOSIS — K640 First degree hemorrhoids: Secondary | ICD-10-CM | POA: Diagnosis not present

## 2016-01-03 DIAGNOSIS — Z86010 Personal history of colonic polyps: Secondary | ICD-10-CM | POA: Diagnosis not present

## 2016-01-03 DIAGNOSIS — Z8673 Personal history of transient ischemic attack (TIA), and cerebral infarction without residual deficits: Secondary | ICD-10-CM | POA: Insufficient documentation

## 2016-01-03 DIAGNOSIS — K529 Noninfective gastroenteritis and colitis, unspecified: Secondary | ICD-10-CM | POA: Diagnosis not present

## 2016-01-03 DIAGNOSIS — I10 Essential (primary) hypertension: Secondary | ICD-10-CM | POA: Insufficient documentation

## 2016-01-03 DIAGNOSIS — M1990 Unspecified osteoarthritis, unspecified site: Secondary | ICD-10-CM | POA: Diagnosis not present

## 2016-01-03 DIAGNOSIS — K219 Gastro-esophageal reflux disease without esophagitis: Secondary | ICD-10-CM | POA: Diagnosis not present

## 2016-01-03 HISTORY — PX: COLONOSCOPY WITH PROPOFOL: SHX5780

## 2016-01-03 LAB — GLUCOSE, CAPILLARY: Glucose-Capillary: 169 mg/dL — ABNORMAL HIGH (ref 65–99)

## 2016-01-03 SURGERY — COLONOSCOPY WITH PROPOFOL
Anesthesia: General

## 2016-01-03 MED ORDER — SODIUM CHLORIDE 0.9 % IV SOLN
INTRAVENOUS | Status: DC
  Administered 2016-01-03: 08:00:00 via INTRAVENOUS

## 2016-01-03 MED ORDER — PROPOFOL 10 MG/ML IV BOLUS
INTRAVENOUS | Status: DC | PRN
  Administered 2016-01-03: 20 mg via INTRAVENOUS
  Administered 2016-01-03: 30 mg via INTRAVENOUS

## 2016-01-03 MED ORDER — PROPOFOL 500 MG/50ML IV EMUL
INTRAVENOUS | Status: DC | PRN
  Administered 2016-01-03: 100 ug/kg/min via INTRAVENOUS

## 2016-01-03 MED ORDER — MIDAZOLAM HCL 5 MG/5ML IJ SOLN
INTRAMUSCULAR | Status: DC | PRN
  Administered 2016-01-03 (×2): 1 mg via INTRAVENOUS

## 2016-01-03 MED ORDER — SODIUM CHLORIDE 0.9 % IV SOLN: INTRAVENOUS | Status: DC

## 2016-01-03 MED ORDER — PHENYLEPHRINE HCL 10 MG/ML IJ SOLN
INTRAMUSCULAR | Status: DC | PRN
  Administered 2016-01-03: 300 ug via INTRAVENOUS
  Administered 2016-01-03: 200 ug via INTRAVENOUS
  Administered 2016-01-03: 100 ug via INTRAVENOUS
  Administered 2016-01-03 (×2): 200 ug via INTRAVENOUS

## 2016-01-03 MED ORDER — FENTANYL CITRATE (PF) 100 MCG/2ML IJ SOLN
INTRAMUSCULAR | Status: DC | PRN
  Administered 2016-01-03: 50 ug via INTRAVENOUS

## 2016-01-03 MED ORDER — LIDOCAINE HCL (PF) 2 % IJ SOLN
INTRAMUSCULAR | Status: DC | PRN
  Administered 2016-01-03: 50 mg

## 2016-01-03 NOTE — Anesthesia Postprocedure Evaluation (Signed)
Anesthesia Post Note  Patient: Olivia Romero  Procedure(s) Performed: Procedure(s) (LRB): COLONOSCOPY WITH PROPOFOL (N/A)  Patient location during evaluation: PACU Anesthesia Type: General Level of consciousness: awake Pain management: satisfactory to patient Vital Signs Assessment: post-procedure vital signs reviewed and stable Respiratory status: nonlabored ventilation Cardiovascular status: blood pressure returned to baseline Anesthetic complications: no    Last Vitals:  Vitals:   01/03/16 0830 01/03/16 0840  BP: 112/64 (!) 111/57  Pulse: 82 73  Resp: 13 12  Temp:      Last Pain:  Vitals:   01/03/16 0810  TempSrc: Tympanic                 VAN STAVEREN,Raushanah Osmundson

## 2016-01-03 NOTE — Anesthesia Preprocedure Evaluation (Signed)
Anesthesia Evaluation  Patient identified by MRN, date of birth, ID band Patient awake    Reviewed: Allergy & Precautions, NPO status , Patient's Chart, lab work & pertinent test results  History of Anesthesia Complications (+) PONV  Airway Mallampati: II       Dental  (+) Teeth Intact   Pulmonary asthma , sleep apnea ,     + decreased breath sounds      Cardiovascular Exercise Tolerance: Good hypertension, Pt. on medications  Rhythm:Regular     Neuro/Psych Anxiety TIA   GI/Hepatic Neg liver ROS, GERD  ,  Endo/Other  diabetes, Type 2, Oral Hypoglycemic Agents  Renal/GU      Musculoskeletal   Abdominal (+) + obese,   Peds  Hematology  (+) anemia ,   Anesthesia Other Findings   Reproductive/Obstetrics                             Anesthesia Physical Anesthesia Plan  ASA: III  Anesthesia Plan: General   Post-op Pain Management:    Induction: Intravenous  Airway Management Planned: Natural Airway and Nasal Cannula  Additional Equipment:   Intra-op Plan:   Post-operative Plan:   Informed Consent: I have reviewed the patients History and Physical, chart, labs and discussed the procedure including the risks, benefits and alternatives for the proposed anesthesia with the patient or authorized representative who has indicated his/her understanding and acceptance.     Plan Discussed with: CRNA  Anesthesia Plan Comments:         Anesthesia Quick Evaluation

## 2016-01-03 NOTE — Transfer of Care (Signed)
Immediate Anesthesia Transfer of Care Note  Patient: Olivia Romero  Procedure(s) Performed: Procedure(s): COLONOSCOPY WITH PROPOFOL (N/A)  Patient Location: PACU  Anesthesia Type:General  Level of Consciousness: sedated  Airway & Oxygen Therapy: Patient Spontanous Breathing and Patient connected to nasal cannula oxygen  Post-op Assessment: Report given to RN and Post -op Vital signs reviewed and stable  Post vital signs: Reviewed and stable  Last Vitals:  Vitals:   01/03/16 0715  BP: (!) 104/44  Pulse: 76  Resp: 18  Temp: 37.7 C    Last Pain:  Vitals:   01/03/16 0715  TempSrc: Tympanic         Complications: No apparent anesthesia complications

## 2016-01-03 NOTE — H&P (Signed)
Primary Care Physician:  Ashok Norris, MD Primary Gastroenterologist:  Dr. Vira Agar  Pre-Procedure History & Physical: HPI:  Olivia Romero is a 60 y.o. female is here for an colonoscopy.   Past Medical History:  Diagnosis Date  . Anemia   . Arthritis   . Asthma   . Carpal tunnel syndrome on right   . Colon polyps    adenomatous  . Diabetes mellitus without complication ([place])   . Diverticulosis of colon   . Esophagitis   . GERD (gastroesophageal reflux disease)   . Hemorrhoid    internal  . Hyperlipemia   . Hypertension   . Neuropathy ([place])   . Pneumonia   . PONV (postoperative nausea and vomiting)   . Sleep apnea    has had in the past lost 50 pounds and do longer uses cpap  . Stroke-like episode ([place]) 2009   TIA    Past Surgical History:  Procedure Laterality Date  . ABDOMINAL HYSTERECTOMY    . BACK SURGERY  09/02/2015  . CERVICAL FUSION    . CESAREAN SECTION  1986  . COLONOSCOPY N/A 11/30/2012   Procedure: COLONOSCOPY;  Surgeon: Irene Shipper, MD;  Location: [facility] ENDOSCOPY;  Service: Endoscopy;  Laterality: N/A;  . ESOPHAGOGASTRODUODENOSCOPY (EGD) WITH PROPOFOL N/A 01/14/2015   Procedure: ESOPHAGOGASTRODUODENOSCOPY (EGD) WITH PROPOFOL;  Surgeon: Manya Silvas, MD;  Location: [facility] ENDOSCOPY;  Service: Endoscopy;  Laterality: N/A;  . LUMBAR WOUND DEBRIDEMENT N/A 10/01/2015   Procedure: LUMBAR WOUND DEBRIDEMENT;  Surgeon: Ashok Pall, MD;  Location: [facility] NEURO ORS;  Service: Neurosurgery;  Laterality: N/A;  LUMBAR WOUND DEBRIDEMENT  . SAVORY DILATION N/A 01/14/2015   Procedure: SAVORY DILATION;  Surgeon: Manya Silvas, MD;  Location: [facility] ENDOSCOPY;  Service: Endoscopy;  Laterality: N/A;  . TONSILLECTOMY      Prior to Admission medications   Medication Sig Start Date End Date Taking? Authorizing Provider  colchicine 0.6 MG tablet Take 1 tablet (0.6 mg total) by mouth 2 (two) times daily. 11/30/15 11/29/16 Yes Lisa Roca, MD  DULoxetine (CYMBALTA) 20 MG  capsule Take by mouth. 12/03/15 05/31/16 Yes Historical Provider, MD  gabapentin (NEURONTIN) 400 MG capsule Take 400 mg by mouth 4 (four) times daily. Reported on 12/13/2015   Yes Historical Provider, MD  glimepiride (AMARYL) 2 MG tablet  10/29/15  Yes Historical Provider, MD  Icosapent Ethyl (VASCEPA) 1 g CAPS Take 2,000 mg by mouth 2 (two) times daily. 11/04/15  Yes Roselee Nova, MD  lisinopril (PRINIVIL,ZESTRIL) 40 MG tablet Take 1 tablet (40 mg total) by mouth daily. 11/04/15  Yes Roselee Nova, MD  meloxicam (MOBIC) 15 MG tablet Take by mouth. 12/03/15  Yes Historical Provider, MD  metFORMIN (GLUCOPHAGE) 500 MG tablet Take 2 tablets (1,000 mg total) by mouth 2 (two) times daily with a meal. 03/17/15  Yes Ashok Norris, MD  Multiple Vitamin (MULTIVITAMIN WITH MINERALS) TABS Take 1 tablet by mouth daily. Reported on 07/21/2015   Yes Historical Provider, MD  pantoprazole (PROTONIX) 40 MG tablet Take by mouth. 11/26/15 01/25/16 Yes Historical Provider, MD  torsemide (DEMADEX) 20 MG tablet Take 1 tablet (20 mg total) by mouth every other day. 04/23/15  Yes Ashok Norris, MD  acetaminophen (TYLENOL) 325 MG tablet Take by mouth. 11/30/12   Historical Provider, MD  albuterol (PROVENTIL HFA;VENTOLIN HFA) 108 (90 BASE) MCG/ACT inhaler Inhale 2 puffs into the lungs every 6 (six) hours as needed for wheezing or shortness of breath. 02/01/15   Serita Grit  Rutherford Nail, MD  ALPRAZolam Duanne Moron) 0.5 MG tablet Take 1 tablet (0.5 mg total) by mouth 2 (two) times daily as needed for anxiety. 04/23/15   Ashok Norris, MD  aspirin 325 MG tablet Take 325 mg by mouth daily.    Historical Provider, MD  clindamycin (CLEOCIN) 300 MG capsule Take 1 capsule (300 mg total) by mouth 3 (three) times daily. Patient not taking: Reported on 01/03/2016 11/30/15   Lisa Roca, MD  Exenatide ER (BYDUREON) 2 MG PEN Inject 1 pen into the skin every Monday. Patient not taking: Reported on 12/13/2015 02/01/15   Ashok Norris, MD   HYDROcodone-acetaminophen (NORCO/VICODIN) 5-325 MG tablet Take by mouth. 11/09/14   Historical Provider, MD  hyoscyamine (LEVSIN SL) 0.125 MG SL tablet Place under the tongue. 11/15/15 01/14/16  Historical Provider, MD  metoCLOPramide (REGLAN) 10 MG tablet Take 1 tablet (10 mg total) by mouth 2 (two) times daily as needed for nausea or vomiting. 11/04/15   Roselee Nova, MD  omeprazole (PRILOSEC) 20 MG capsule Take 20 mg by mouth daily.    Historical Provider, MD  polyethylene glycol powder (GLYCOLAX/MIRALAX) powder Take 17 g by mouth daily. 07/13/15   Steele Sizer, MD    Allergies as of 12/13/2015 - Review Complete 12/13/2015  Allergen Reaction Noted  . Eggs or egg-derived products Other (See Comments) 12/26/2014  . Atorvastatin Other (See Comments) 11/27/2012  . Flu virus vaccine Other (See Comments) 02/01/2015  . Latex Itching 11/27/2012  . Pravastatin Itching and Rash 11/27/2012    Family History  Problem Relation Age of Onset  . Lung cancer Father   . Hypertension Father   . Arthritis Father   . Other Mother     hardening of the arteries/renal cell carcenoma  . Hypertension Mother   . Stroke Mother   . Arthritis Brother   . Rheum arthritis Maternal Uncle     Social History   Social History  . Marital status: Single    Spouse name: N/A  . Number of children: 1  . 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 Smoking status: Never Smoker  . Smokeless tobacco: Never Used  . Alcohol use No     Comment: wine every 6-8 months  . Drug use: No  . Sexual activity: No   Other Topics Concern  . Not on file   Social History Narrative  . No narrative on file    Review of Systems: See HPI, otherwise negative ROS  Physical Exam: BP (!) 104/44   Pulse 76   Temp 99.8 F (37.7 C) (Tympanic)   Resp 18   Ht 5' (1.524 m)   Wt 89.8 kg (198 lb)   SpO2 100%   BMI 38.67 kg/m  General:   Alert,  pleasant and  cooperative in NAD Head:  Normocephalic and atraumatic. Neck:  Supple; no masses or thyromegaly. Lungs:  Clear throughout to auscultation.    Heart:  Regular rate and rhythm. Abdomen:  Soft, nontender and nondistended. Normal bowel sounds, without guarding, and without rebound.   Neurologic:  Alert and  oriented x4;  grossly normal neurologically.  Impression/Plan: Olivia Romero is here for an colonoscopy to be performed for rectal bleeding and abdominal pain and diarrhea  Risks, benefits, limitations, and alternatives regarding  colonoscopy have been reviewed with the patient.  Questions have been answered.  All parties agreeable.   Gaylyn Cheers, MD  01/03/2016,  7:32 AM

## 2016-01-03 NOTE — Op Note (Signed)
[facility] Gastroenterology Patient Name: Olivia Romero Procedure Date: 01/03/2016 7:40 AM MRN: [id number] Account #: [id number] Date of Birth: 06/27/1955 Admit Type: Outpatient Age: 60 Room: [facility] ENDO ROOM 4 Gender: Female Note Status: Finalized Procedure:            Colonoscopy Indications:          Generalized abdominal pain, Rectal bleeding, Chronic                        diarrhea Providers:            Manya Silvas, MD Referring MD:         No Local Md, MD (Referring MD) Medicines:            Propofol per Anesthesia Complications:        No immediate complications. Procedure:            Pre-Anesthesia Assessment:                       - After reviewing the risks and benefits, the patient                        was deemed in satisfactory condition to undergo the                        procedure.                       After obtaining informed consent, the colonoscope was                        passed under direct vision. Throughout the procedure,                        the patient's blood pressure, pulse, and oxygen                        saturations were monitored continuously. The                        Colonoscope was introduced through the anus and                        advanced to the the cecum, identified by appendiceal                        orifice and ileocecal valve. The colonoscopy was                        performed without difficulty. The patient tolerated the                        procedure well. The quality of the bowel preparation                        was good. Findings:      Three sessile polyps were found in the sigmoid colon. The polyps were       small in size. These polyps were removed with a hot snare. Resection and       retrieval were complete.      Multiple small-mouthed diverticula were found in  the sigmoid colon,       descending colon and transverse colon.      A diffuse area of granular mucosa was found in the entire  colon.       Biopsies were taken with a cold forceps for histology. Ascending,       transverse,descending, sigmoid biopsied.      Internal hemorrhoids were found during endoscopy. The hemorrhoids were       small and Grade I (internal hemorrhoids that do not prolapse). Impression:           - Three small polyps in the sigmoid colon, removed with                        a hot snare. Resected and retrieved.                       - Diverticulosis in the sigmoid colon, in the                        descending colon and in the transverse colon.                       - Granularity in the entire examined colon. Biopsied.                       - Internal hemorrhoids. Recommendation:       - Await pathology results. Manya Silvas, MD 01/03/2016 8:11:38 AM This report has been signed electronically. Number of Addenda: 0 Note Initiated On: 01/03/2016 7:40 AM Scope Withdrawal Time: 0 hours 16 minutes 16 seconds  Total Procedure Duration: 0 hours 20 minutes 38 seconds       [facility]

## 2016-01-04 ENCOUNTER — Encounter: Payer: Self-pay | Admitting: Unknown Physician Specialty

## 2016-01-04 LAB — SURGICAL PATHOLOGY

## 2016-02-08 ENCOUNTER — Encounter: Payer: Self-pay | Admitting: Family Medicine

## 2016-02-08 ENCOUNTER — Ambulatory Visit (INDEPENDENT_AMBULATORY_CARE_PROVIDER_SITE_OTHER): Payer: 59 | Admitting: Family Medicine

## 2016-02-08 VITALS — BP 128/82 | HR 97 | Temp 98.7°F | Resp 18 | Ht 60.0 in | Wt 205.3 lb

## 2016-02-08 DIAGNOSIS — I10 Essential (primary) hypertension: Secondary | ICD-10-CM

## 2016-02-08 DIAGNOSIS — H9201 Otalgia, right ear: Secondary | ICD-10-CM

## 2016-02-08 DIAGNOSIS — E781 Pure hyperglyceridemia: Secondary | ICD-10-CM | POA: Diagnosis not present

## 2016-02-08 MED ORDER — LISINOPRIL 40 MG PO TABS: 40.0000 mg | ORAL_TABLET | Freq: Every day | ORAL | 1 refills | Status: DC

## 2016-02-08 MED ORDER — ICOSAPENT ETHYL 1 G PO CAPS: 2000.0000 mg | ORAL_CAPSULE | Freq: Two times a day (BID) | ORAL | 1 refills | Status: DC

## 2016-02-08 NOTE — Progress Notes (Signed)
Name: Olivia Romero   MRN: [id number]    DOB: 1955/11/19   Date:02/08/2016       Progress Note  Subjective  Chief Complaint  Chief Complaint  Patient presents with  . Hypertension    pt here for 3 month follow up  . Other    diabetic gastroparesis    Hypertension  This is a chronic problem. The problem is unchanged. The problem is controlled. Pertinent negatives include no blurred vision, chest pain, headaches, palpitations or shortness of breath. Past treatments include ACE inhibitors. Hypertensive end-organ damage includes CVA (hx of TIA.). There is no history of kidney disease or CAD/MI.  Otalgia   There is pain in the right ear. This is a recurrent problem. The current episode started in the past 7 days. The problem has been waxing and waning. There has been no fever. Associated symptoms include a sore throat (last night). Pertinent negatives include no coughing, diarrhea, ear discharge or headaches. She has tried nothing for the symptoms.  Hyperlipidemia  This is a chronic problem. The problem is uncontrolled. Recent lipid tests were reviewed and are high. Pertinent negatives include no chest pain or shortness of breath. Treatments tried: Vascepa.    Past Medical History:  Diagnosis Date  . Anemia   . Arthritis   . Asthma   . Carpal tunnel syndrome on right   . Colon polyps    adenomatous  . Diabetes mellitus without complication ([place])   . Diverticulosis of colon   . Esophagitis   . GERD (gastroesophageal reflux disease)   . Hemorrhoid    internal  . Hyperlipemia   . Hypertension   . Neuropathy ([place])   . Pneumonia   . PONV (postoperative nausea and vomiting)   . Sleep apnea    has had in the past lost 50 pounds and do longer uses cpap  . Stroke-like episode ([place]) 2009   TIA    Past Surgical History:  Procedure Laterality Date  . ABDOMINAL HYSTERECTOMY    . BACK SURGERY  09/02/2015  . CERVICAL FUSION    . CESAREAN SECTION  1986  . COLONOSCOPY N/A 11/30/2012    Procedure: COLONOSCOPY;  Surgeon: Irene Shipper, MD;  Location: [facility] ENDOSCOPY;  Service: Endoscopy;  Laterality: N/A;  . COLONOSCOPY WITH PROPOFOL N/A 01/03/2016   Procedure: COLONOSCOPY WITH PROPOFOL;  Surgeon: Manya Silvas, MD;  Location: [facility] ENDOSCOPY;  Service: Endoscopy;  Laterality: N/A;  . ESOPHAGOGASTRODUODENOSCOPY (EGD) WITH PROPOFOL N/A 01/14/2015   Procedure: ESOPHAGOGASTRODUODENOSCOPY (EGD) WITH PROPOFOL;  Surgeon: Manya Silvas, MD;  Location: [facility] ENDOSCOPY;  Service: Endoscopy;  Laterality: N/A;  . LUMBAR WOUND DEBRIDEMENT N/A 10/01/2015   Procedure: LUMBAR WOUND DEBRIDEMENT;  Surgeon: Ashok Pall, MD;  Location: [facility] NEURO ORS;  Service: Neurosurgery;  Laterality: N/A;  LUMBAR WOUND DEBRIDEMENT  . SAVORY DILATION N/A 01/14/2015   Procedure: SAVORY DILATION;  Surgeon: Manya Silvas, MD;  Location: [facility] ENDOSCOPY;  Service: Endoscopy;  Laterality: N/A;  . TONSILLECTOMY      Family History  Problem Relation Age of Onset  . Lung cancer Father   . Hypertension Father   . Arthritis Father   . Other Mother     hardening of the arteries/renal cell carcenoma  . Hypertension Mother   . Stroke Mother   . Arthritis Brother   . Rheum arthritis Maternal Uncle     Social History   Social History  . Marital status: Single    Spouse name: N/A  . Number  of children: 1  . 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 Smoking status: Never Smoker  . Smokeless tobacco: Never Used  . Alcohol use No     Comment: wine every 6-8 months  . Drug use: No  . Sexual activity: No   Other Topics Concern  . Not on file   Social History Narrative  . No narrative on file     Current Outpatient Prescriptions:  .  balsalazide (COLAZAL) 750 MG capsule, Take by mouth., Disp: , Rfl:  .  acetaminophen (TYLENOL) 325 MG tablet, Take by mouth., Disp: , Rfl:  .  albuterol (PROVENTIL HFA;VENTOLIN HFA) 108 (90 BASE) MCG/ACT  inhaler, Inhale 2 puffs into the lungs every 6 (six) hours as needed for wheezing or shortness of breath., Disp: 1 Inhaler, Rfl: 3 .  ALPRAZolam (XANAX) 0.5 MG tablet, Take 1 tablet (0.5 mg total) by mouth 2 (two) times daily as needed for anxiety., Disp: 30 tablet, Rfl: 2 .  aspirin 325 MG tablet, Take 325 mg by mouth daily., Disp: , Rfl:  .  clindamycin (CLEOCIN) 300 MG capsule, Take 1 capsule (300 mg total) by mouth 3 (three) times daily. (Patient not taking: Reported on 01/03/2016), Disp: 30 capsule, Rfl: 0 .  colchicine 0.6 MG tablet, Take 1 tablet (0.6 mg total) by mouth 2 (two) times daily., Disp: 30 tablet, Rfl: 2 .  DULoxetine (CYMBALTA) 20 MG capsule, Take by mouth., Disp: , Rfl:  .  Exenatide ER (BYDUREON) 2 MG PEN, Inject 1 pen into the skin every Monday. (Patient not taking: Reported on 12/13/2015), Disp: 1 each, Rfl: 3 .  gabapentin (NEURONTIN) 400 MG capsule, Take 400 mg by mouth 4 (four) times daily. Reported on 12/13/2015, Disp: , Rfl:  .  glimepiride (AMARYL) 2 MG tablet, , Disp: , Rfl:  .  HYDROcodone-acetaminophen (NORCO/VICODIN) 5-325 MG tablet, Take by mouth., Disp: , Rfl:  .  hyoscyamine (LEVSIN SL) 0.125 MG SL tablet, Place under the tongue., Disp: , Rfl:  .  Icosapent Ethyl (VASCEPA) 1 g CAPS, Take 2,000 mg by mouth 2 (two) times daily., Disp: 120 capsule, Rfl: 2 .  lisinopril (PRINIVIL,ZESTRIL) 40 MG tablet, Take 1 tablet (40 mg total) by mouth daily., Disp: 90 tablet, Rfl: 0 .  meloxicam (MOBIC) 15 MG tablet, Take by mouth., Disp: , Rfl:  .  metFORMIN (GLUCOPHAGE) 500 MG tablet, Take 2 tablets (1,000 mg total) by mouth 2 (two) times daily with a meal., Disp: 120 tablet, Rfl: 3 .  metoCLOPramide (REGLAN) 10 MG tablet, Take 1 tablet (10 mg total) by mouth 2 (two) times daily as needed for nausea or vomiting., Disp: 180 tablet, Rfl: 0 .  Multiple Vitamin (MULTIVITAMIN WITH MINERALS) TABS, Take 1 tablet by mouth daily. Reported on 07/21/2015, Disp: , Rfl:  .  omeprazole (PRILOSEC)  20 MG capsule, Take 20 mg by mouth daily., Disp: , Rfl:  .  polyethylene glycol powder (GLYCOLAX/MIRALAX) powder, Take 17 g by mouth daily., Disp: 3350 g, Rfl: 5 .  torsemide (DEMADEX) 20 MG tablet, Take 1 tablet (20 mg total) by mouth every other day., Disp: 15 tablet, Rfl: 2  Current Facility-Administered Medications:  .  Exenatide ER PEN 1 pen, 1 pen, Subcutaneous, Once, Ashok Norris, MD  Allergies  Allergen Reactions  . Eggs Or Egg-Derived Products Other (See Comments)    Angioedema  . Atorvastatin Other (See Comments)    Liver toxicity  .  Flu Virus Vaccine Other (See Comments)  . Latex Itching  . Pravastatin Itching and Rash     Review of Systems  Constitutional: Negative for chills and fever.  HENT: Positive for ear pain and sore throat (last night). Negative for ear discharge.   Eyes: Negative for blurred vision.  Respiratory: Negative for cough and shortness of breath.   Cardiovascular: Negative for chest pain and palpitations.  Gastrointestinal: Negative for diarrhea.  Neurological: Negative for headaches.     Objective  Vitals:   02/08/16 0845  BP: 128/82  Pulse: 97  Resp: 18  Temp: 98.7 F (37.1 C)  SpO2: 98%  Weight: 205 lb 5 oz (93.1 kg)  Height: 5' (1.524 m)    Physical Exam  Constitutional: She is well-developed, well-nourished, and in no distress.  HENT:  Right Ear: Tympanic membrane and ear canal normal. No drainage or swelling.  Mouth/Throat: Posterior oropharyngeal erythema present.  Cardiovascular: Normal rate, regular rhythm, S1 normal, S2 normal and normal heart sounds.   No murmur heard. Pulmonary/Chest: Breath sounds normal.  Abdominal: Soft. Bowel sounds are normal. There is no tenderness.  Musculoskeletal:       Right ankle: She exhibits swelling.       Left ankle: She exhibits swelling.  Generalized lower extremity swelling.  Nursing note and vitals reviewed.     Assessment & Plan  1. Essential hypertension, benign BP  stable and at goal on present therapy. - lisinopril (PRINIVIL,ZESTRIL) 40 MG tablet; Take 1 tablet (40 mg total) by mouth daily.  Dispense: 90 tablet; Refill: 1  2. Hypertriglyceridemia Cannot take statins, continue on Vascepa 2g twice daily.  - Icosapent Ethyl (VASCEPA) 1 g CAPS; Take 2,000 mg by mouth 2 (two) times daily.  Dispense: 360 capsule; Refill: 1 - Lipid Profile - COMPLETE METABOLIC PANEL WITH GFR  3. Otalgia of right ear Likely from sinus pressure, she has hx of seasonal allergies. No evidence of viral etiology.   Anaija Wissink [facility] 02/08/2016 8:56 AM

## 2016-05-09 ENCOUNTER — Ambulatory Visit: Payer: 59 | Admitting: Family Medicine

## 2016-05-15 ENCOUNTER — Ambulatory Visit (INDEPENDENT_AMBULATORY_CARE_PROVIDER_SITE_OTHER): Payer: Self-pay | Admitting: Family Medicine

## 2016-05-15 ENCOUNTER — Encounter: Payer: Self-pay | Admitting: Family Medicine

## 2016-05-15 VITALS — BP 125/76 | HR 108 | Temp 98.9°F | Resp 17 | Ht 60.0 in | Wt 206.7 lb

## 2016-05-15 DIAGNOSIS — I10 Essential (primary) hypertension: Secondary | ICD-10-CM

## 2016-05-15 DIAGNOSIS — E781 Pure hyperglyceridemia: Secondary | ICD-10-CM

## 2016-05-15 DIAGNOSIS — J4520 Mild intermittent asthma, uncomplicated: Secondary | ICD-10-CM

## 2016-05-15 DIAGNOSIS — E1142 Type 2 diabetes mellitus with diabetic polyneuropathy: Secondary | ICD-10-CM

## 2016-05-15 LAB — POCT GLYCOSYLATED HEMOGLOBIN (HGB A1C): HEMOGLOBIN A1C: 11

## 2016-05-15 LAB — POCT UA - MICROALBUMIN
ALBUMIN/CREATININE RATIO, URINE, POC: NEGATIVE
Creatinine, POC: NEGATIVE mg/dL
Microalbumin Ur, POC: NEGATIVE mg/L

## 2016-05-15 LAB — GLUCOSE, POCT (MANUAL RESULT ENTRY): POC Glucose: 245 mg/dl — AB (ref 70–99)

## 2016-05-15 MED ORDER — ALBUTEROL SULFATE HFA 108 (90 BASE) MCG/ACT IN AERS: 2.0000 | INHALATION_SPRAY | Freq: Four times a day (QID) | RESPIRATORY_TRACT | 3 refills | Status: DC | PRN

## 2016-05-15 NOTE — Progress Notes (Signed)
Name: Olivia Romero   MRN: [id number]    DOB: Mar 20, 1956   Date:05/15/2016       Progress Note  Subjective  Chief Complaint  Chief Complaint  Patient presents with  . Follow-up    6 mo  . Medication Refill    albuterol inhaler / test strips    Hypertension  This is a chronic problem. The problem is controlled. Pertinent negatives include no chest pain or headaches. Past treatments include ACE inhibitors. 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 There is no history of kidney disease or CAD/MI.  Hyperlipidemia  This is a chronic problem. The problem is uncontrolled. Recent lipid tests were reviewed and are high (pt. did not obtain Fasting Lipid panel ordered 3 and  months ago). Pertinent negatives include no chest pain. Treatments tried: Fish oil Vascepa 1000mg  twice daily.  Diabetes  She presents for her follow-up diabetic visit. She has type 2 diabetes mellitus. Her disease course has been worsening. Pertinent negatives for hypoglycemia include no headaches. Pertinent negatives for diabetes include no chest pain, no polydipsia and no polyuria. Diabetic complications include a CVA (TIA approx 10 years ago). Current diabetic treatment includes oral agent (dual therapy) (she is no longer taking Bydureon, and taking Metformin and Glimepiride half of what she should take). She is following a diabetic diet. Her breakfast blood glucose range is generally 180-200 mg/dl.    Past Medical History:  Diagnosis Date  . Anemia   . Arthritis   . Asthma   . Carpal tunnel syndrome on right   . Colon polyps    adenomatous  . Diabetes mellitus without complication ([place])   . Diverticulosis of colon   . Esophagitis   . GERD (gastroesophageal reflux disease)   . Hemorrhoid    internal  . Hyperlipemia   . Hypertension   . Neuropathy ([place])   . Pneumonia   . PONV (postoperative nausea and vomiting)   . Sleep apnea    has had in the past lost 50 pounds and do longer  uses cpap  . Stroke-like episode ([place]) 2009   TIA    Past Surgical History:  Procedure Laterality Date  . ABDOMINAL HYSTERECTOMY    . BACK SURGERY  09/02/2015  . CERVICAL FUSION    . CESAREAN SECTION  1986  . COLONOSCOPY N/A 11/30/2012   Procedure: COLONOSCOPY;  Surgeon: Irene Shipper, MD;  Location: [facility] ENDOSCOPY;  Service: Endoscopy;  Laterality: N/A;  . COLONOSCOPY WITH PROPOFOL N/A 01/03/2016   Procedure: COLONOSCOPY WITH PROPOFOL;  Surgeon: Manya Silvas, MD;  Location: [facility] ENDOSCOPY;  Service: Endoscopy;  Laterality: N/A;  . ESOPHAGOGASTRODUODENOSCOPY (EGD) WITH PROPOFOL N/A 01/14/2015   Procedure: ESOPHAGOGASTRODUODENOSCOPY (EGD) WITH PROPOFOL;  Surgeon: Manya Silvas, MD;  Location: [facility] ENDOSCOPY;  Service: Endoscopy;  Laterality: N/A;  . LUMBAR WOUND DEBRIDEMENT N/A 10/01/2015   Procedure: LUMBAR WOUND DEBRIDEMENT;  Surgeon: Ashok Pall, MD;  Location: [facility] NEURO ORS;  Service: Neurosurgery;  Laterality: N/A;  LUMBAR WOUND DEBRIDEMENT  . SAVORY DILATION N/A 01/14/2015   Procedure: SAVORY DILATION;  Surgeon: Manya Silvas, MD;  Location: [facility] ENDOSCOPY;  Service: Endoscopy;  Laterality: N/A;  . TONSILLECTOMY      Family History  Problem Relation Age of Onset  . Lung cancer Father   . Hypertension Father   . Arthritis Father   . Other Mother     hardening of the arteries/renal cell carcenoma  . Hypertension Mother   . Stroke Mother   .  Arthritis Brother   . Rheum arthritis Maternal Uncle     Social History   Social History  . Marital status: Single    Spouse name: N/A  . Number of children: 1  . 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 Smoking status: Never Smoker  . Smokeless tobacco: Never Used  . Alcohol use No     Comment: wine every 6-8 months  . Drug use: No  . Sexual activity: No   Other Topics Concern  . Not on file   Social History Narrative  . No narrative on file      Current Outpatient Prescriptions:  .  acetaminophen (TYLENOL) 325 MG tablet, Take by mouth., Disp: , Rfl:  .  albuterol (PROVENTIL HFA;VENTOLIN HFA) 108 (90 BASE) MCG/ACT inhaler, Inhale 2 puffs into the lungs every 6 (six) hours as needed for wheezing or shortness of breath., Disp: 1 Inhaler, Rfl: 3 .  ALPRAZolam (XANAX) 0.5 MG tablet, Take 1 tablet (0.5 mg total) by mouth 2 (two) times daily as needed for anxiety., Disp: 30 tablet, Rfl: 2 .  aspirin 325 MG tablet, Take 325 mg by mouth daily., Disp: , Rfl:  .  balsalazide (COLAZAL) 750 MG capsule, Take by mouth., Disp: , Rfl:  .  colchicine 0.6 MG tablet, Take 1 tablet (0.6 mg total) by mouth 2 (two) times daily., Disp: 30 tablet, Rfl: 2 .  DULoxetine (CYMBALTA) 20 MG capsule, Take by mouth., Disp: , Rfl:  .  gabapentin (NEURONTIN) 400 MG capsule, Take 400 mg by mouth 4 (four) times daily. Reported on 12/13/2015, Disp: , Rfl:  .  glimepiride (AMARYL) 2 MG tablet, , Disp: , Rfl:  .  HYDROcodone-acetaminophen (NORCO/VICODIN) 5-325 MG tablet, Take by mouth., Disp: , Rfl:  .  Icosapent Ethyl (VASCEPA) 1 g CAPS, Take 2,000 mg by mouth 2 (two) times daily., Disp: 360 capsule, Rfl: 1 .  lisinopril (PRINIVIL,ZESTRIL) 40 MG tablet, Take 1 tablet (40 mg total) by mouth daily., Disp: 90 tablet, Rfl: 1 .  meloxicam (MOBIC) 15 MG tablet, Take by mouth., Disp: , Rfl:  .  metFORMIN (GLUCOPHAGE) 500 MG tablet, Take 2 tablets (1,000 mg total) by mouth 2 (two) times daily with a meal., Disp: 120 tablet, Rfl: 3 .  metoCLOPramide (REGLAN) 10 MG tablet, Take 1 tablet (10 mg total) by mouth 2 (two) times daily as needed for nausea or vomiting., Disp: 180 tablet, Rfl: 0 .  Multiple Vitamin (MULTIVITAMIN WITH MINERALS) TABS, Take 1 tablet by mouth daily. Reported on 07/21/2015, Disp: , Rfl:  .  omeprazole (PRILOSEC) 20 MG capsule, Take 20 mg by mouth daily., Disp: , Rfl:  .  polyethylene glycol powder (GLYCOLAX/MIRALAX) powder, Take 17 g by mouth daily., Disp: 3350  g, Rfl: 5 .  torsemide (DEMADEX) 20 MG tablet, Take 1 tablet (20 mg total) by mouth every other day., Disp: 15 tablet, Rfl: 2 .  clindamycin (CLEOCIN) 300 MG capsule, Take 1 capsule (300 mg total) by mouth 3 (three) times daily. (Patient not taking: Reported on 05/15/2016), Disp: 30 capsule, Rfl: 0 .  Exenatide ER (BYDUREON) 2 MG PEN, Inject 1 pen into the skin every Monday. (Patient not taking: Reported on 05/15/2016), Disp: 1 each, Rfl: 3 .  hyoscyamine (LEVSIN SL) 0.125 MG SL tablet, Place under the tongue., Disp: , Rfl:   Current Facility-Administered Medications:  .  Exenatide ER PEN 1 pen, 1 pen, Subcutaneous, Once,  Ashok Norris, MD  Allergies  Allergen Reactions  . Eggs Or Egg-Derived Products Other (See Comments)    Angioedema  . Atorvastatin Other (See Comments)    Liver toxicity  . Flu Virus Vaccine Other (See Comments)  . Latex Itching  . Pravastatin Itching and Rash     Review of Systems  Cardiovascular: Negative for chest pain.  Neurological: Negative for headaches.  Endo/Heme/Allergies: Negative for polydipsia.    Objective  Vitals:   05/15/16 0915  BP: 125/76  Pulse: (!) 108  Resp: 17  Temp: 98.9 F (37.2 C)  TempSrc: Oral  SpO2: 97%  Weight: 206 lb 11.2 oz (93.8 kg)  Height: 5' (1.524 m)    Physical Exam  Constitutional: She is oriented to person, place, and time and well-developed, well-nourished, and in no distress.  HENT:  Head: Normocephalic and atraumatic.  Cardiovascular: Normal heart sounds.  Tachycardia present.   Pulmonary/Chest: Breath sounds normal.  Abdominal: Soft. Bowel sounds are normal.  Neurological: She is alert and oriented to person, place, and time.  Nursing note and vitals reviewed.      Assessment & Plan  1. Hypertriglyceridemia  - Lipid Profile - COMPLETE METABOLIC PANEL WITH GFR  2. Essential hypertension, benign BP stable and controlled on present antihypertensive therapy  3. Diabetic peripheral neuropathy  associated with type 2 diabetes mellitus ([place]) Be managed by endocrinology, advised to schedule an appointment to discuss changes in therapy. - POCT HgB A1C - POCT Glucose (CBG)  4. Mild intermittent asthma without complication  - albuterol (PROVENTIL HFA;VENTOLIN HFA) 108 (90 Base) MCG/ACT inhaler; Inhale 2 puffs into the lungs every 6 (six) hours as needed for wheezing or shortness of breath.  Dispense: 1 Inhaler; Refill: 3   Crysta Gulick Asad A. [facility] 05/15/2016 9:25 AM

## 2016-08-14 ENCOUNTER — Ambulatory Visit: Payer: Self-pay | Admitting: Family Medicine

## 2016-08-16 ENCOUNTER — Encounter: Payer: Self-pay | Admitting: Family Medicine

## 2016-08-16 ENCOUNTER — Ambulatory Visit (INDEPENDENT_AMBULATORY_CARE_PROVIDER_SITE_OTHER): Payer: BLUE CROSS/BLUE SHIELD | Admitting: Family Medicine

## 2016-08-16 VITALS — BP 136/74 | HR 100 | Temp 98.6°F | Resp 16 | Ht 60.0 in | Wt 198.0 lb

## 2016-08-16 DIAGNOSIS — E119 Type 2 diabetes mellitus without complications: Secondary | ICD-10-CM

## 2016-08-16 DIAGNOSIS — E781 Pure hyperglyceridemia: Secondary | ICD-10-CM | POA: Diagnosis not present

## 2016-08-16 DIAGNOSIS — I10 Essential (primary) hypertension: Secondary | ICD-10-CM | POA: Diagnosis not present

## 2016-08-16 LAB — GLUCOSE, POCT (MANUAL RESULT ENTRY): POC Glucose: 178 mg/dl — AB (ref 70–99)

## 2016-08-16 LAB — POCT GLYCOSYLATED HEMOGLOBIN (HGB A1C): HEMOGLOBIN A1C: 9.8

## 2016-08-16 MED ORDER — LISINOPRIL 40 MG PO TABS: 40.0000 mg | ORAL_TABLET | Freq: Every day | ORAL | 1 refills | Status: DC

## 2016-08-16 NOTE — Progress Notes (Signed)
Name: Olivia Romero   MRN: [id number]    DOB: 08-09-1955   Date:08/16/2016       Progress Note  Subjective  Chief Complaint  Chief Complaint  Patient presents with  . Diabetes  . Otalgia    Diabetes  She presents for her follow-up diabetic visit. She has type 2 diabetes mellitus. Her disease course has been improving. Pertinent negatives for hypoglycemia include no headaches. Associated symptoms include foot paresthesias and foot ulcerations (seen by Podiatry). Pertinent negatives for diabetes include no chest pain, no polydipsia and no polyuria. Symptoms are stable. Diabetic complications include a CVA (TIA approx 10 years ago). Current diabetic treatment includes oral agent (dual therapy). She is following a diabetic and generally healthy diet. She monitors blood glucose at home 3-4 x per week. Her breakfast blood glucose range is generally 180-200 mg/dl. An ACE inhibitor/angiotensin II receptor blocker is being taken.  Hypertension  This is a chronic problem. The problem is unchanged. The problem is controlled. Pertinent negatives include no chest pain, headaches, orthopnea, palpitations or shortness of breath. Past treatments include ACE inhibitors. 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 There is no history of kidney disease or CAD/MI.  Hyperlipidemia  This is a chronic problem. The problem is uncontrolled. Recent lipid tests were reviewed and are high (pt. did not obtain Fasting Lipid panel ordered 3 and 6 months ago). Pertinent negatives include no chest pain or shortness of breath. Treatments tried: Fish oil Vascepa 1000mg  twice daily., not taking it regularly.     Past Medical History:  Diagnosis Date  . Anemia   . Arthritis   . Asthma   . Carpal tunnel syndrome on right   . Colon polyps    adenomatous  . Diabetes mellitus without complication ([place])   . Diverticulosis of colon   . Esophagitis   . GERD (gastroesophageal reflux disease)   .  Hemorrhoid    internal  . Hyperlipemia   . Hypertension   . Neuropathy ([place])   . Pneumonia   . PONV (postoperative nausea and vomiting)   . Sleep apnea    has had in the past lost 50 pounds and do longer uses cpap  . Stroke-like episode ([place]) 2009   TIA    Past Surgical History:  Procedure Laterality Date  . ABDOMINAL HYSTERECTOMY    . BACK SURGERY  09/02/2015  . CERVICAL FUSION    . CESAREAN SECTION  1986  . COLONOSCOPY N/A 11/30/2012   Procedure: COLONOSCOPY;  Surgeon: Irene Shipper, MD;  Location: [facility] ENDOSCOPY;  Service: Endoscopy;  Laterality: N/A;  . COLONOSCOPY WITH PROPOFOL N/A 01/03/2016   Procedure: COLONOSCOPY WITH PROPOFOL;  Surgeon: Manya Silvas, MD;  Location: [facility] ENDOSCOPY;  Service: Endoscopy;  Laterality: N/A;  . ESOPHAGOGASTRODUODENOSCOPY (EGD) WITH PROPOFOL N/A 01/14/2015   Procedure: ESOPHAGOGASTRODUODENOSCOPY (EGD) WITH PROPOFOL;  Surgeon: Manya Silvas, MD;  Location: [facility] ENDOSCOPY;  Service: Endoscopy;  Laterality: N/A;  . LUMBAR WOUND DEBRIDEMENT N/A 10/01/2015   Procedure: LUMBAR WOUND DEBRIDEMENT;  Surgeon: Ashok Pall, MD;  Location: [place] NEURO ORS;  Service: Neurosurgery;  Laterality: N/A;  LUMBAR WOUND DEBRIDEMENT  . SAVORY DILATION N/A 01/14/2015   Procedure: SAVORY DILATION;  Surgeon: Manya Silvas, MD;  Location: [facility] ENDOSCOPY;  Service: Endoscopy;  Laterality: N/A;  . TONSILLECTOMY      Family History  Problem Relation Age of Onset  . Lung cancer Father   . Hypertension Father   . Arthritis Father   .  Other Mother     hardening of the arteries/renal cell carcenoma  . Hypertension Mother   . Stroke Mother   . Arthritis Brother   . Rheum arthritis Maternal Uncle     Social History   Social History  . Marital status: Single    Spouse name: N/A  . Number of children: 1  . 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 Smoking status: Never Smoker  . Smokeless tobacco:  Never Used  . Alcohol use No     Comment: wine every 6-8 months  . Drug use: No  . Sexual activity: No   Other Topics Concern  . Not on file   Social History Narrative  . No narrative on file     Current Outpatient Prescriptions:  .  acetaminophen (TYLENOL) 325 MG tablet, Take by mouth., Disp: , Rfl:  .  albuterol (PROVENTIL HFA;VENTOLIN HFA) 108 (90 Base) MCG/ACT inhaler, Inhale 2 puffs into the lungs every 6 (six) hours as needed for wheezing or shortness of breath., Disp: 1 Inhaler, Rfl: 3 .  aspirin 325 MG tablet, Take 325 mg by mouth daily., Disp: , Rfl:  .  balsalazide (COLAZAL) 750 MG capsule, Take by mouth., Disp: , Rfl:  .  clindamycin (CLEOCIN) 300 MG capsule, Take 1 capsule (300 mg total) by mouth 3 (three) times daily., Disp: 30 capsule, Rfl: 0 .  colchicine 0.6 MG tablet, Take 1 tablet (0.6 mg total) by mouth 2 (two) times daily., Disp: 30 tablet, Rfl: 2 .  Exenatide ER (BYDUREON) 2 MG PEN, Inject 1 pen into the skin every Monday., Disp: 1 each, Rfl: 3 .  gabapentin (NEURONTIN) 400 MG capsule, Take 400 mg by mouth 4 (four) times daily. Reported on 12/13/2015, Disp: , Rfl:  .  glimepiride (AMARYL) 2 MG tablet, , Disp: , Rfl:  .  HYDROcodone-acetaminophen (NORCO/VICODIN) 5-325 MG tablet, Take by mouth., Disp: , Rfl:  .  Icosapent Ethyl (VASCEPA) 1 g CAPS, Take 2,000 mg by mouth 2 (two) times daily., Disp: 360 capsule, Rfl: 1 .  lisinopril (PRINIVIL,ZESTRIL) 40 MG tablet, Take 1 tablet (40 mg total) by mouth daily., Disp: 90 tablet, Rfl: 1 .  meloxicam (MOBIC) 15 MG tablet, Take by mouth., Disp: , Rfl:  .  metFORMIN (GLUCOPHAGE) 500 MG tablet, Take 2 tablets (1,000 mg total) by mouth 2 (two) times daily with a meal., Disp: 120 tablet, Rfl: 3 .  metoCLOPramide (REGLAN) 10 MG tablet, Take 1 tablet (10 mg total) by mouth 2 (two) times daily as needed for nausea or vomiting., Disp: 180 tablet, Rfl: 0 .  Multiple Vitamin (MULTIVITAMIN WITH MINERALS) TABS, Take 1 tablet by mouth  daily. Reported on 07/21/2015, Disp: , Rfl:  .  omeprazole (PRILOSEC) 20 MG capsule, Take 20 mg by mouth daily., Disp: , Rfl:  .  polyethylene glycol powder (GLYCOLAX/MIRALAX) powder, Take 17 g by mouth daily., Disp: 3350 g, Rfl: 5 .  torsemide (DEMADEX) 20 MG tablet, Take 1 tablet (20 mg total) by mouth every other day., Disp: 15 tablet, Rfl: 2 .  DULoxetine (CYMBALTA) 20 MG capsule, Take by mouth., Disp: , Rfl:  .  hyoscyamine (LEVSIN SL) 0.125 MG SL tablet, Place under the tongue., Disp: , Rfl:   Current Facility-Administered Medications:  .  Exenatide ER PEN 1 pen, 1 pen, Subcutaneous, Once, Ashok Norris, MD  Allergies  Allergen Reactions  . Eggs Or Egg-Derived Products Other (See  Comments)    Angioedema  . Atorvastatin Other (See Comments)    Liver toxicity  . Flu Virus Vaccine Other (See Comments)  . Latex Itching  . Pravastatin Itching and Rash     Review of Systems  Respiratory: Negative for shortness of breath.   Cardiovascular: Negative for chest pain, palpitations and orthopnea.  Neurological: Negative for headaches.  Endo/Heme/Allergies: Negative for polydipsia.     Objective  Vitals:   08/16/16 1110  BP: 136/74  Pulse: 100  Resp: 16  Temp: 98.6 F (37 C)  TempSrc: Oral  SpO2: 97%  Weight: 198 lb (89.8 kg)  Height: 5' (1.524 m)    Physical Exam  Constitutional: She is oriented to person, place, and time and well-developed, well-nourished, and in no distress.  HENT:  Head: Normocephalic and atraumatic.  Cardiovascular: Normal rate, regular rhythm and normal heart sounds.   No murmur heard. Pulmonary/Chest: Effort normal and breath sounds normal. She has no wheezes.  Abdominal: Soft. Bowel sounds are normal.  Musculoskeletal: She exhibits edema (right lower extremity with special boot).  Neurological: She is alert and oriented to person, place, and time.  Psychiatric: Mood, memory, affect and judgment normal.  Nursing note and vitals  reviewed.      Recent Results (from the past 2160 hour(s))  POCT Glucose (CBG)     Status: Abnormal   Collection Time: 08/16/16 11:14 AM  Result Value Ref Range   POC Glucose 178 (A) 70 - 99 mg/dl  POCT HgB A1C     Status: None   Collection Time: 08/16/16 11:15 AM  Result Value Ref Range   Hemoglobin A1C 9.8      Assessment & Plan  1. Type 2 diabetes mellitus without complication, without long-term current use of insulin (HCC) Point-of-care A1c is 9.8%, improved from 11.0% from 3 months ago, patient has in the past followed by endocrinology and she was advised to continue her care with endocrinology. She will call to make an appointment - POCT HgB A1C - POCT Glucose (CBG)  2. Essential hypertension, benign  - lisinopril (PRINIVIL,ZESTRIL) 40 MG tablet; Take 1 tablet (40 mg total) by mouth daily.  Dispense: 90 tablet; Refill: 1  3. Hypertriglyceridemia  - Lipid panel - COMPLETE METABOLIC PANEL WITH GFR   Waylynn Benefiel Asad A. [facility] 08/16/2016 11:23 AM

## 2016-08-31 ENCOUNTER — Ambulatory Visit (INDEPENDENT_AMBULATORY_CARE_PROVIDER_SITE_OTHER): Payer: BLUE CROSS/BLUE SHIELD | Admitting: Podiatry

## 2016-08-31 ENCOUNTER — Ambulatory Visit (INDEPENDENT_AMBULATORY_CARE_PROVIDER_SITE_OTHER): Payer: BLUE CROSS/BLUE SHIELD

## 2016-08-31 ENCOUNTER — Encounter: Payer: Self-pay | Admitting: Podiatry

## 2016-08-31 DIAGNOSIS — M779 Enthesopathy, unspecified: Secondary | ICD-10-CM

## 2016-08-31 DIAGNOSIS — E1140 Type 2 diabetes mellitus with diabetic neuropathy, unspecified: Secondary | ICD-10-CM

## 2016-08-31 DIAGNOSIS — M79671 Pain in right foot: Secondary | ICD-10-CM | POA: Diagnosis not present

## 2016-08-31 DIAGNOSIS — L97311 Non-pressure chronic ulcer of right ankle limited to breakdown of skin: Secondary | ICD-10-CM

## 2016-08-31 DIAGNOSIS — M79672 Pain in left foot: Secondary | ICD-10-CM

## 2016-08-31 DIAGNOSIS — E1149 Type 2 diabetes mellitus with other diabetic neurological complication: Secondary | ICD-10-CM

## 2016-08-31 NOTE — Progress Notes (Signed)
   Subjective:    Patient ID: Olivia Romero, female    DOB: 1956-04-06, 61 y.o.   MRN: [id number]  HPI  Chief Complaint  Patient presents with  . Skin Lesion    Right; Lateral side of foot; beneath 5th toe; painful x "since august". Pt states that she has noticed green drainage from the lesion for months.   . Callouses    Left; Plantar Forefoot. Pt wants it checked to see if it will turn into a sore.       Review of Systems     Objective:   Physical Exam        Assessment & Plan:

## 2016-09-01 NOTE — Progress Notes (Signed)
Subjective:     Patient ID: Olivia Romero, female   DOB: 01-04-1956, 61 y.o.   MRN: [id number]  HPI patient states that she's had an open area on the bottom of her right foot for around 6 months and she is now wearing a boot and that it never seems to quite heal but it does not seem to get worse. Also states that she is in poor health currently and is on disability   Review of Systems  All other systems reviewed and are negative.      Objective:   Physical Exam  Constitutional: She is oriented to person, place, and time.  Cardiovascular: Intact distal pulses.   Musculoskeletal: Normal range of motion.  Neurological: She is oriented to person, place, and time.  Skin: Skin is warm.  Nursing note and vitals reviewed.  Neurovascular status was found to be intact with patient found to have moderate restriction of motion subtalar midtarsal joint extremely prominent forefoot with metatarsal perfusion and an approximate 8 x 8 mm round breakdown of tissue on the fifth metatarsal right localized to skin with no subcutaneous exposure. 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 At this point we are going to try a very soft type accommodative orthotic with a cut out around the fifth metatarsal right and then the consideration long-term for fifth metatarsal head resection is present. We will try the most conservative measure first and see what kind results we can get  X-rays indicate pressure against the metatarsal heads bilateral with foot structure being poor

## 2016-09-05 ENCOUNTER — Ambulatory Visit: Payer: Self-pay | Admitting: Podiatry

## 2016-09-20 ENCOUNTER — Ambulatory Visit (INDEPENDENT_AMBULATORY_CARE_PROVIDER_SITE_OTHER): Payer: BLUE CROSS/BLUE SHIELD | Admitting: Family Medicine

## 2016-09-20 ENCOUNTER — Encounter: Payer: Self-pay | Admitting: Family Medicine

## 2016-09-20 VITALS — BP 132/68 | HR 113 | Temp 98.3°F | Resp 17 | Ht 60.0 in | Wt 209.1 lb

## 2016-09-20 DIAGNOSIS — Z1239 Encounter for other screening for malignant neoplasm of breast: Secondary | ICD-10-CM

## 2016-09-20 DIAGNOSIS — Z0000 Encounter for general adult medical examination without abnormal findings: Secondary | ICD-10-CM | POA: Diagnosis not present

## 2016-09-20 DIAGNOSIS — Z1231 Encounter for screening mammogram for malignant neoplasm of breast: Secondary | ICD-10-CM

## 2016-09-20 DIAGNOSIS — H9190 Unspecified hearing loss, unspecified ear: Secondary | ICD-10-CM

## 2016-09-20 LAB — COMPLETE METABOLIC PANEL WITH GFR
ALT: 16 U/L (ref 6–29)
AST: 14 U/L (ref 10–35)
Albumin: 4 g/dL (ref 3.6–5.1)
Alkaline Phosphatase: 80 U/L (ref 33–130)
BUN: 16 mg/dL (ref 7–25)
CHLORIDE: 102 mmol/L (ref 98–110)
CO2: 25 mmol/L (ref 20–31)
CREATININE: 0.58 mg/dL (ref 0.50–1.05)
Calcium: 9.8 mg/dL (ref 8.6–10.4)
GFR, Est African American: >89 mL/min (ref >=60)
GFR, Est Non African American: >89 mL/min (ref >=60)
Glucose, Bld: 149 mg/dL — ABNORMAL HIGH (ref 65–99)
Potassium: 5.2 mmol/L (ref 3.5–5.3)
SODIUM: 138 mmol/L (ref 135–146)
TOTAL PROTEIN: 7.3 g/dL (ref 6.1–8.1)
Total Bilirubin: 0.3 mg/dL (ref 0.2–1.2)

## 2016-09-20 LAB — LIPID PANEL
Cholesterol: 296 mg/dL — ABNORMAL HIGH (ref <200)
HDL: 34 mg/dL — ABNORMAL LOW (ref >50)
Total CHOL/HDL Ratio: 8.7 Ratio — ABNORMAL HIGH (ref <5.0)
Triglycerides: 886 mg/dL — ABNORMAL HIGH (ref <150)

## 2016-09-20 LAB — CBC WITH DIFFERENTIAL/PLATELET
BASOS PCT: 1 %
Basophils Absolute: 93 cells/uL (ref 0–200)
EOS PCT: 3 %
Eosinophils Absolute: 279 cells/uL (ref 15–500)
HCT: 40 % (ref 35.0–45.0)
Hemoglobin: 13.1 g/dL (ref 11.7–15.5)
Lymphocytes Relative: 34 %
Lymphs Abs: 3162 cells/uL (ref 850–3900)
MCH: 27.2 pg (ref 27.0–33.0)
MCHC: 32.8 g/dL (ref 32.0–36.0)
MCV: 83.2 fL (ref 80.0–100.0)
MPV: 10.3 fL (ref 7.5–12.5)
Monocytes Absolute: 744 cells/uL (ref 200–950)
Monocytes Relative: 8 %
NEUTROS ABS: 5022 {cells}/uL (ref 1500–7800)
Neutrophils Relative %: 54 %
PLATELETS: 294 10*3/uL (ref 140–400)
RBC: 4.81 MIL/uL (ref 3.80–5.10)
RDW: 14.6 % (ref 11.0–15.0)
WBC: 9.3 10*3/uL (ref 3.8–10.8)

## 2016-09-20 LAB — TSH: TSH: 0.7 m[IU]/L

## 2016-09-20 NOTE — Progress Notes (Signed)
Name: Olivia Romero   MRN: [id number]    DOB: 02-20-1956   Date:09/20/2016       Progress Note  Subjective  Chief Complaint  Chief Complaint  Patient presents with  . Annual Exam    CPE    HPI  Pt. Presents for complete physical exam. Colonoscopy was completed in July 2017, repeat in 2020. Mammogram was completed in April 2014, was negative.  She is followed by [facility] for Pap Smear.  She had a DEXA scan years ago, no records available.  Past Medical History:  Diagnosis Date  . Anemia   . Arthritis   . Asthma   . Carpal tunnel syndrome on right   . Colon polyps    adenomatous  . Diabetes mellitus without complication ([place])   . Diverticulosis of colon   . Esophagitis   . GERD (gastroesophageal reflux disease)   . Hemorrhoid    internal  . Hyperlipemia   . Hypertension   . Neuropathy   . Pneumonia   . PONV (postoperative nausea and vomiting)   . Sleep apnea    has had in the past lost 50 pounds and do longer uses cpap  . Stroke-like episode ([place]) 2009   TIA    Past Surgical History:  Procedure Laterality Date  . ABDOMINAL HYSTERECTOMY    . BACK SURGERY  09/02/2015  . CERVICAL FUSION    . CESAREAN SECTION  1986  . COLONOSCOPY N/A 11/30/2012   Procedure: COLONOSCOPY;  Surgeon: Irene Shipper, MD;  Location: [facility] ENDOSCOPY;  Service: Endoscopy;  Laterality: N/A;  . COLONOSCOPY WITH PROPOFOL N/A 01/03/2016   Procedure: COLONOSCOPY WITH PROPOFOL;  Surgeon: Manya Silvas, MD;  Location: [facility] ENDOSCOPY;  Service: Endoscopy;  Laterality: N/A;  . ESOPHAGOGASTRODUODENOSCOPY (EGD) WITH PROPOFOL N/A 01/14/2015   Procedure: ESOPHAGOGASTRODUODENOSCOPY (EGD) WITH PROPOFOL;  Surgeon: Manya Silvas, MD;  Location[facility] ENDOSCOPY;  Service: Endoscopy;  Laterality: N/A;  . LUMBAR WOUND DEBRIDEMENT N/A 10/01/2015   Procedure: LUMBAR WOUND DEBRIDEMENT;  Surgeon: Ashok Pall, MD;  Location: [facility] NEURO ORS;  Service: Neurosurgery;  Laterality: N/A;  LUMBAR WOUND DEBRIDEMENT   . SAVORY DILATION N/A 01/14/2015   Procedure: SAVORY DILATION;  Surgeon: Manya Silvas, MD;  Location: [facility] ENDOSCOPY;  Service: Endoscopy;  Laterality: N/A;  . TONSILLECTOMY      Family History  Problem Relation Age of Onset  . Lung cancer Father   . Hypertension Father   . Arthritis Father   . Other Mother     hardening of the arteries/renal cell carcenoma  . Hypertension Mother   . Stroke Mother   . Arthritis Brother   . Rheum arthritis Maternal Uncle     Social History   Social History  . Marital status: Single    Spouse name: N/A  . Number of children: 1  . 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 Smoking status: Never Smoker  . Smokeless tobacco: Never Used  . Alcohol use No     Comment: wine every 6-8 months  . Drug use: No  . Sexual activity: No   Other Topics Concern  . Not on file   Social History Narrative  . No narrative on file     Current Outpatient Prescriptions:  .  acetaminophen (TYLENOL) 325 MG tablet, Take by mouth., Disp: , Rfl:  .  albuterol (PROVENTIL HFA;VENTOLIN HFA) 108 (90 Base) MCG/ACT inhaler, Inhale  2 puffs into the lungs every 6 (six) hours as needed for wheezing or shortness of breath., Disp: 1 Inhaler, Rfl: 3 .  aspirin 325 MG tablet, Take 325 mg by mouth daily., Disp: , Rfl:  .  balsalazide (COLAZAL) 750 MG capsule, Take by mouth., Disp: , Rfl:  .  gabapentin (NEURONTIN) 400 MG capsule, Take 400 mg by mouth 4 (four) times daily. Reported on 12/13/2015, Disp: , Rfl:  .  glimepiride (AMARYL) 2 MG tablet, , Disp: , Rfl:  .  HYDROcodone-acetaminophen (NORCO/VICODIN) 5-325 MG tablet, Take by mouth., Disp: , Rfl:  .  Icosapent Ethyl (VASCEPA) 1 g CAPS, Take 2,000 mg by mouth 2 (two) times daily., Disp: 360 capsule, Rfl: 1 .  lisinopril (PRINIVIL,ZESTRIL) 40 MG tablet, Take 1 tablet (40 mg total) by mouth daily., Disp: 90 tablet, Rfl: 1 .  meloxicam (MOBIC) 15 MG  tablet, Take by mouth., Disp: , Rfl:  .  metFORMIN (GLUCOPHAGE) 500 MG tablet, Take 2 tablets (1,000 mg total) by mouth 2 (two) times daily with a meal., Disp: 120 tablet, Rfl: 3 .  Multiple Vitamin (MULTIVITAMIN WITH MINERALS) TABS, Take 1 tablet by mouth daily. Reported on 07/21/2015, Disp: , Rfl:  .  omeprazole (PRILOSEC) 20 MG capsule, Take 20 mg by mouth daily., Disp: , Rfl:  .  torsemide (DEMADEX) 20 MG tablet, Take 1 tablet (20 mg total) by mouth every other day., Disp: 15 tablet, Rfl: 2 .  hyoscyamine (LEVSIN SL) 0.125 MG SL tablet, Place under the tongue., Disp: , Rfl:  .  metoCLOPramide (REGLAN) 10 MG tablet, Take 1 tablet (10 mg total) by mouth 2 (two) times daily as needed for nausea or vomiting. (Patient not taking: Reported on 09/20/2016), Disp: 180 tablet, Rfl: 0  Allergies  Allergen Reactions  . Eggs Or Egg-Derived Products Other (See Comments)    Angioedema  . Atorvastatin Other (See Comments)    Liver toxicity  . Flu Virus Vaccine Other (See Comments)  . Latex Itching  . Pravastatin Itching and Rash     Review of Systems  Constitutional: Negative for chills, fever and malaise/fatigue.  HENT: Positive for hearing loss (decreased hearing, noticed by family members, reportedly present in both years). Negative for congestion, ear pain, sinus pain and sore throat.   Eyes: Negative for blurred vision and double vision.  Respiratory: Negative for cough, sputum production and shortness of breath.   Cardiovascular: Negative for chest pain, palpitations and leg swelling.  Gastrointestinal: Negative for abdominal pain, blood in stool, constipation, nausea and vomiting.  Genitourinary: Negative for dysuria and hematuria.  Musculoskeletal: Negative for back pain and neck pain.  Neurological: Negative for dizziness and headaches.  Psychiatric/Behavioral: Negative for depression. The patient is not nervous/anxious and does not have insomnia.      Objective  Vitals:   09/20/16  1115  BP: 132/68  Pulse: (!) 113  Resp: 17  Temp: 98.3 F (36.8 C)  TempSrc: Oral  SpO2: 95%  Weight: 209 lb 1.6 oz (94.8 kg)  Height: 5' (1.524 m)    Physical Exam  Constitutional: She is oriented to person, place, and time.  HENT:  Head: Normocephalic and atraumatic.  Right Ear: External ear normal.  Left Ear: External ear normal.  Cardiovascular: Normal rate, regular rhythm and normal heart sounds.   No murmur heard. Pulmonary/Chest: Effort normal and breath sounds normal. She has no wheezes.  Abdominal: Soft. Bowel sounds are normal. There is no tenderness.  Musculoskeletal: She exhibits edema (trace edema left lower  extremity).  Neurological: She is alert and oriented to person, place, and time.  Psychiatric: Mood, memory, affect and judgment normal.  Nursing note and vitals reviewed.      Recent Results (from the past 2160 hour(s))  POCT Glucose (CBG)     Status: Abnormal   Collection Time: 08/16/16 11:14 AM  Result Value Ref Range   POC Glucose 178 (A) 70 - 99 mg/dl  POCT HgB A1C     Status: None   Collection Time: 08/16/16 11:15 AM  Result Value Ref Range   Hemoglobin A1C 9.8      Assessment & Plan  1. Well woman exam without gynecological exam Obtain age-appropriate laboratory screenings - CBC with Differential/Platelet - COMPLETE METABOLIC PANEL WITH GFR - Lipid panel - TSH - VITAMIN D 25 Hydroxy (Vit-D Deficiency, Fractures)  2. Screening for breast cancer  - MM Digital Screening; Future  3. Hearing loss, unspecified hearing loss type, unspecified laterality Unclear etiology, referral to ENT/audiology for management - Ambulatory referral to ENT   Cyriah Childrey [facility] 09/20/2016 11:19 AM

## 2016-09-21 LAB — VITAMIN D 25 HYDROXY (VIT D DEFICIENCY, FRACTURES): VIT D 25 HYDROXY: 11 ng/mL — AB (ref 30–100)

## 2016-09-25 ENCOUNTER — Other Ambulatory Visit: Payer: BLUE CROSS/BLUE SHIELD

## 2016-09-27 ENCOUNTER — Telehealth: Payer: Self-pay

## 2016-09-27 MED ORDER — VITAMIN D (ERGOCALCIFEROL) 1.25 MG (50000 UNIT) PO CAPS: 50000.0000 [IU] | ORAL_CAPSULE | ORAL | 0 refills | Status: DC

## 2016-09-27 NOTE — Telephone Encounter (Signed)
Patient has been notified of lab results and a prescription for Vitamin D3 50,000 units take 1 capsule by mouth once a week x12 weeks has been sent to [facility] per Dr. Manuella Ghazi, patient has been notified and verbalized understanding

## 2016-10-12 ENCOUNTER — Encounter: Payer: Self-pay | Admitting: Podiatry

## 2016-10-12 ENCOUNTER — Ambulatory Visit (INDEPENDENT_AMBULATORY_CARE_PROVIDER_SITE_OTHER): Payer: BLUE CROSS/BLUE SHIELD | Admitting: Podiatry

## 2016-10-12 DIAGNOSIS — Q828 Other specified congenital malformations of skin: Secondary | ICD-10-CM | POA: Diagnosis not present

## 2016-10-12 DIAGNOSIS — E1140 Type 2 diabetes mellitus with diabetic neuropathy, unspecified: Secondary | ICD-10-CM | POA: Diagnosis not present

## 2016-10-12 DIAGNOSIS — E1149 Type 2 diabetes mellitus with other diabetic neurological complication: Secondary | ICD-10-CM

## 2016-10-12 DIAGNOSIS — M216X9 Other acquired deformities of unspecified foot: Secondary | ICD-10-CM | POA: Diagnosis not present

## 2016-10-12 NOTE — Patient Instructions (Signed)

## 2016-10-12 NOTE — Progress Notes (Signed)
Subjective:    Patient ID: Olivia Romero, female   DOB: 61 y.o.   MRN: [id number]   HPI patient presents stating she still having problems around the fifth metatarsal head right with diminished drainage and the orthotics seem to be comfortable for her    ROS      Objective:  Physical Exam Diabetic with neurovascular status that's not changed with patient noted to have continued reactive tissue head of fifth metatarsal right with a minimal area of the center that has very slight drainage measuring about 2 x 2 mm. Patient has good digital perfusion well oriented and her last A1c is starting to go down and was 9.8    Assessment:    Long-term diabetic with plantar flexed fifth metatarsal right with lesion formation that is improving but still present     Plan:     Using sharp sterile his mentation I debrided the lesion fully and I then instructed on orthotic usage and see back again in 6 weeks. Still may require fifth metatarsal head resection which I educated her on today

## 2016-10-17 ENCOUNTER — Ambulatory Visit
Admission: RE | Admit: 2016-10-17 | Discharge: 2016-10-17 | Disposition: A | Discharge status: Home / Self Care | Payer: BLUE CROSS/BLUE SHIELD | Source: Ambulatory Visit | Attending: Family Medicine | Admitting: Family Medicine

## 2016-10-17 DIAGNOSIS — Z1239 Encounter for other screening for malignant neoplasm of breast: Secondary | ICD-10-CM

## 2016-10-17 DIAGNOSIS — Z1231 Encounter for screening mammogram for malignant neoplasm of breast: Secondary | ICD-10-CM | POA: Diagnosis not present

## 2016-10-31 NOTE — Progress Notes (Signed)
11/01/2016 10:36 AM   Olivia Romero 06/22/1955 [id number]  Referring provider: Ashok Norris, MD No address on file  Chief Complaint  Patient presents with  . New Patient (Initial Visit)    Urge Incontinence referred by Dr. Manuella Ghazi    HPI: Patient is a 61 -year-old Caucasian female who is referred to [place] by, her neurologist, Dr. Manuella Ghazi, for urinary incontinence.  Patient states that she has had worsening urinary incontinence for the last 8 months.  What is somewhat concerning to her is that she is had a few episodes when she felt the urge to urinate but not have the sensation of urination during micturition.  She is also having some mild urge incontinence.  She is wearing pads daily, but she wears the pads daily moore for security than actual incontinence.    She is having associated urgency and nocturia.    She does not have a history of urinary tract infections, STI's or injury to the bladder.   She denies dysuria, gross hematuria, suprapubic pain, back pain, abdominal pain or flank pain.  She has not had any recent fevers, chills, nausea or vomiting.   She does not have a history of nephrolithiasis, GU surgery or GU trauma.   She is post menopausal.   She is having fecal incontinence.    She has not had any recent imaging studies.    She is drinking 64 ounces of water daily.   She is drinking 1 caffeinated beverages daily.  She is not drinking alcoholic beverages daily.    Her risk factors for incontinence are obesity, vaginal delivery, age, caffeine, diabetes, stroke, depression, fecal incontinence, vaginal atrophy, pelvic surgery and/or neurological disorders.   She is taking antihistamines, decongestants, ACE inhibitors, alpha-agonists, alpha blockers, antidepressants and skeletal muscle relaxants.    Her PVR is 0 mL.     PMH: Past Medical History:  Diagnosis Date  . Anemia   . Arthritis   . Asthma   . Carpal tunnel syndrome on right   . Colon polyps    adenomatous  . Diabetes mellitus without complication ([place])   . Diverticulosis of colon   . Esophagitis   . GERD (gastroesophageal reflux disease)   . Hemorrhoid    internal  . Hyperlipemia   . Hypertension   . Neuropathy   . Pneumonia   . PONV (postoperative nausea and vomiting)   . Sleep apnea    has had in the past lost 50 pounds and do longer uses cpap  . Stroke-like episode ([place]) 2009   TIA    Surgical History: Past Surgical History:  Procedure Laterality Date  . ABDOMINAL HYSTERECTOMY    . BACK SURGERY  09/02/2015  . CERVICAL FUSION    . CESAREAN SECTION  1986  . COLONOSCOPY N/A 11/30/2012   Procedure: COLONOSCOPY;  Surgeon: Irene Shipper, MD;  Location: [facility] ENDOSCOPY;  Service: Endoscopy;  Laterality: N/A;  . COLONOSCOPY WITH PROPOFOL N/A 01/03/2016   Procedure: COLONOSCOPY WITH PROPOFOL;  Surgeon: Manya Silvas, MD;  Location: [facility] ENDOSCOPY;  Service: Endoscopy;  Laterality: N/A;  . ESOPHAGOGASTRODUODENOSCOPY (EGD) WITH PROPOFOL N/A 01/14/2015   Procedure: ESOPHAGOGASTRODUODENOSCOPY (EGD) WITH PROPOFOL;  Surgeon: Manya Silvas, MD;  Location: [facility] ENDOSCOPY;  Service: Endoscopy;  Laterality: N/A;  . LUMBAR WOUND DEBRIDEMENT N/A 10/01/2015   Procedure: LUMBAR WOUND DEBRIDEMENT;  Surgeon: Ashok Pall, MD;  Location: [place] NEURO ORS;  Service: Neurosurgery;  Laterality: N/A;  LUMBAR WOUND DEBRIDEMENT  . SAVORY DILATION N/A 01/14/2015  Procedure: SAVORY DILATION;  Surgeon: Manya Silvas, MD;  Location: [facility] ENDOSCOPY;  Service: Endoscopy;  Laterality: N/A;  . TONSILLECTOMY      Home Medications:  Allergies as of 11/01/2016      Reactions   Eggs Or Egg-derived Products Other (See Comments)   Angioedema   Atorvastatin Other (See Comments)   Liver toxicity   Flu Virus Vaccine Other (See Comments)   Latex Itching   Pravastatin Itching, Rash      Medication List       Accurate as of 11/01/16 10:36 AM. Always use your most recent med list.            acetaminophen 325 MG tablet Commonly known as:  TYLENOL Take by mouth.   albuterol 108 (90 Base) MCG/ACT inhaler Commonly known as:  PROVENTIL HFA;VENTOLIN HFA Inhale 2 puffs into the lungs every 6 (six) hours as needed for wheezing or shortness of breath.   aspirin 325 MG tablet Take 325 mg by mouth daily.   balsalazide 750 MG capsule Commonly known as:  COLAZAL Take by mouth.   cyclobenzaprine 10 MG tablet Commonly known as:  FLEXERIL cyclobenzaprine 10 mg tablet  Take 1 tablet qhs by oral route prn spasm.   DULoxetine 60 MG capsule Commonly known as:  CYMBALTA Take by mouth.   gabapentin 400 MG capsule Commonly known as:  NEURONTIN Take 400 mg by mouth 4 (four) times daily. Reported on 12/13/2015   glimepiride 2 MG tablet Commonly known as:  AMARYL   HYDROcodone-acetaminophen 5-325 MG tablet Commonly known as:  NORCO/VICODIN Take by mouth.   hyoscyamine 0.125 MG SL tablet Commonly known as:  LEVSIN SL Place under the tongue.   Icosapent Ethyl 1 g Caps Commonly known as:  VASCEPA Take 2,000 mg by mouth 2 (two) times daily.   INVOKANA 100 MG Tabs tablet Generic drug:  canagliflozin Invokana 100 mg tablet   KEFLEX 500 MG capsule Generic drug:  cephALEXin Keflex 500 mg capsule  Take 1 capsule every 12 hours by oral route for 7 days.   lisinopril 40 MG tablet Commonly known as:  PRINIVIL,ZESTRIL Take 1 tablet (40 mg total) by mouth daily.   meloxicam 15 MG tablet Commonly known as:  MOBIC Take by mouth.   metFORMIN 500 MG tablet Commonly known as:  GLUCOPHAGE Take 2 tablets (1,000 mg total) by mouth 2 (two) times daily with a meal.   metoCLOPramide 10 MG tablet Commonly known as:  REGLAN Take 1 tablet (10 mg total) by mouth 2 (two) times daily as needed for nausea or vomiting.   moxifloxacin 400 MG tablet Commonly known as:  AVELOX moxifloxacin 400 mg tablet   multivitamin with minerals Tabs tablet Take 1 tablet by mouth daily. Reported on  07/21/2015   NIASPAN 500 MG CR tablet Generic drug:  niacin Niaspan 500 mg tablet,extended release   omeprazole 20 MG capsule Commonly known as:  PRILOSEC Take 20 mg by mouth daily.   OxyCODONE HCl (Abuse Deter) 5 MG Taba Commonly known as:  OXAYDO oxycodone 5 mg tablet   tobramycin-dexamethasone ophthalmic solution Commonly known as:  TOBRADEX tobramycin 0.3 %-dexamethasone 0.1 % eye drops,suspension   torsemide 20 MG tablet Commonly known as:  DEMADEX Take 1 tablet (20 mg total) by mouth every other day.   TRILIPIX 135 MG capsule Generic drug:  Choline Fenofibrate Trilipix 135 mg capsule,delayed release   VICTOZA 18 MG/3ML Sopn Generic drug:  liraglutide Victoza 3-Pak 0.6 mg/0.1 mL (18 mg/3 mL) subcutaneous  pen injector   Vitamin D (Ergocalciferol) 50000 units Caps capsule Commonly known as:  DRISDOL Take 1 capsule (50,000 Units total) by mouth once a week. For 12 weeks       Allergies:  Allergies  Allergen Reactions  . Eggs Or Egg-Derived Products Other (See Comments)    Angioedema  . Atorvastatin Other (See Comments)    Liver toxicity  . Flu Virus Vaccine Other (See Comments)  . Latex Itching  . Pravastatin Itching and Rash    Family History: Family History  Problem Relation Age of Onset  . Lung cancer Father   . Hypertension Father   . Arthritis Father   . Other Mother        hardening of the arteries/renal cell carcenoma  . Hypertension Mother   . Stroke Mother   . Kidney cancer Mother   . Arthritis Brother   . Rheum arthritis Maternal Uncle   . Bladder Cancer Neg Hx     Social History:  reports that she has never smoked. She has never used smokeless tobacco. She reports that she does not drink alcohol or use drugs.  ROS: UROLOGY Frequent Urination?: No Hard to postpone urination?: Yes Burning/pain with urination?: No Get up at night to urinate?: Yes Leakage of urine?: Yes Urine stream starts and stops?: No Trouble starting stream?:  No Do you have to strain to urinate?: No Blood in urine?: No Urinary tract infection?: No Sexually transmitted disease?: No Injury to kidneys or bladder?: No Painful intercourse?: No Weak stream?: No Currently pregnant?: No Vaginal bleeding?: No Last menstrual period?: n  Gastrointestinal Nausea?: No Vomiting?: No Indigestion/heartburn?: No Diarrhea?: No Constipation?: Yes  Constitutional Fever: No Night sweats?: No Weight loss?: No Fatigue?: No  Skin Skin rash/lesions?: No Itching?: No  Eyes Blurred vision?: No Double vision?: No  Ears/Nose/Throat Sore throat?: No Sinus problems?: No  Hematologic/Lymphatic Swollen glands?: No Easy bruising?: No  Cardiovascular Leg swelling?: Yes Chest pain?: No  Respiratory Cough?: No Shortness of breath?: No  Endocrine Excessive thirst?: No  Musculoskeletal Back pain?: Yes Joint pain?: Yes  Neurological Headaches?: No Dizziness?: No  Psychologic Depression?: No Anxiety?: No  Physical Exam: BP 134/80   Pulse 94   Ht 5' (1.524 m)   Wt 207 lb (93.9 kg)   BMI 40.43 kg/m   Constitutional: Well nourished. Alert and oriented, No acute distress. HEENT: [place] AT, moist mucus membranes. Trachea midline, no masses. Cardiovascular: No clubbing, cyanosis, or edema. Respiratory: Normal respiratory effort, no increased work of breathing. GI: Abdomen is soft, non tender, non distended, no abdominal masses. Liver and spleen not palpable.  No hernias appreciated.  Stool sample for occult testing is not indicated.   GU: No CVA tenderness.  No bladder fullness or masses.  Atrophic external genitalia, normal pubic hair distribution, no lesions.  Normal urethral meatus, no lesions, no prolapse, no discharge.   No urethral masses, tenderness and/or tenderness. No bladder fullness, tenderness or masses. Pale vagina mucosa, good estrogen effect, no discharge, no lesions, good pelvic support, no cystocele or rectocele noted.   Cervix, uterus and adnexa are surgically absent.  Anus and perineum are without rashes or lesions.   No saddle paresthesia.   Skin: No rashes, bruises or suspicious lesions. Lymph: No cervical or inguinal adenopathy. Neurologic: Grossly intact, no focal deficits, moving all 4 extremities. Psychiatric: Normal mood and affect.  Laboratory Data: Lab Results  Component Value Date   WBC 9.3 09/20/2016   HGB 13.1 09/20/2016   HCT  40.0 09/20/2016   MCV 83.2 09/20/2016   PLT 294 09/20/2016    Lab Results  Component Value Date   CREATININE 0.58 09/20/2016    Lab Results  Component Value Date   HGBA1C 9.8 08/16/2016    Lab Results  Component Value Date   TSH 0.70 09/20/2016       Component Value Date/Time   CHOL 296 (H) 09/20/2016 1141   CHOL 292 (H) 02/22/2015 1016   HDL 34 (L) 09/20/2016 1141   HDL 24 (L) 02/22/2015 1016   CHOLHDL 8.7 (H) 09/20/2016 1141   VLDL NOT CALC 09/20/2016 1141   LDLCALC NOT CALC 09/20/2016 1141   LDLCALC Comment 02/22/2015 1016    Lab Results  Component Value Date   AST 14 09/20/2016   Lab Results  Component Value Date   ALT 16 09/20/2016    Pertinent Imaging: Results for KAMORI, KITCHENS (MRN [id number]) as of 11/01/2016 10:55  Ref. Range 11/01/2016 10:07  Scan Result Unknown 0    Assessment & Plan:    1. Urge Incontinence  - offered behavioral therapies (continue to strive for better DM control, obtain CPAP machine, reduce weight)  - offered bladder training, bladder control strategies and pelvic floor muscle training - patient deferred at this time as she feels her symptoms are not very severe at this time  - fluid management - good water intake  - offered medical therapy with anticholinergic therapy or beta-3 adrenergic receptor agonist and the potential side effects of each therapy - patient deferred at this time as she feels her symptoms are not very severe at this time  2. Numbness during urination  - dicussed undergoing UDS  for further evaluation - patient deferred at this time due to she is overwhelmed with so many doctors appointments at this time  - she feels it has not happened very often and would like to call back if the frequency of the event increases  - there was no saddle numbness on exam and her PVR was minimal, so I was agreeable to wait until she worsens to pursue UDS   Return for Patient will call if symptoms worsen.  These notes generated with voice recognition software. I apologize for typographical errors.  Zara Council, [facility] [address] [place], [place] [address] [phone]

## 2016-11-01 ENCOUNTER — Ambulatory Visit: Payer: BLUE CROSS/BLUE SHIELD | Admitting: Urology

## 2016-11-01 ENCOUNTER — Encounter: Payer: Self-pay | Admitting: Urology

## 2016-11-01 VITALS — BP 134/80 | HR 94 | Ht 60.0 in | Wt 207.0 lb

## 2016-11-01 DIAGNOSIS — N399 Disorder of urinary system, unspecified: Secondary | ICD-10-CM | POA: Diagnosis not present

## 2016-11-01 DIAGNOSIS — N3941 Urge incontinence: Secondary | ICD-10-CM

## 2016-11-01 LAB — BLADDER SCAN AMB NON-IMAGING: Scan Result: 0

## 2016-11-23 ENCOUNTER — Ambulatory Visit (INDEPENDENT_AMBULATORY_CARE_PROVIDER_SITE_OTHER): Payer: BLUE CROSS/BLUE SHIELD | Admitting: Podiatry

## 2016-11-23 ENCOUNTER — Ambulatory Visit (INDEPENDENT_AMBULATORY_CARE_PROVIDER_SITE_OTHER): Payer: BLUE CROSS/BLUE SHIELD

## 2016-11-23 VITALS — BP 118/66 | HR 85 | Temp 97.0°F | Resp 16

## 2016-11-23 DIAGNOSIS — M79671 Pain in right foot: Secondary | ICD-10-CM | POA: Diagnosis not present

## 2016-11-23 DIAGNOSIS — L02619 Cutaneous abscess of unspecified foot: Secondary | ICD-10-CM | POA: Diagnosis not present

## 2016-11-23 DIAGNOSIS — L03119 Cellulitis of unspecified part of limb: Secondary | ICD-10-CM | POA: Diagnosis not present

## 2016-11-23 MED ORDER — AMOXICILLIN-POT CLAVULANATE 875-125 MG PO TABS: 1.0000 | ORAL_TABLET | Freq: Two times a day (BID) | ORAL | 0 refills | Status: DC

## 2016-11-23 MED ORDER — CIPROFLOXACIN HCL 500 MG PO TABS: 500.0000 mg | ORAL_TABLET | Freq: Two times a day (BID) | ORAL | 0 refills | Status: DC

## 2016-11-23 NOTE — Progress Notes (Signed)
Subjective:    Patient ID: Olivia Romero, female   DOB: 61 y.o.   MRN: [id number]   HPI patient states that her right fifth metatarsal was doing fine but she rolled it over [place] garden hose on Monday and it started to get irritated on Tuesday. She noted redness in her foot yesterday and she did have a temperature of approximate 101 yesterday but is not having a temperature today but does have drainage from this area. Also states that her sugars been running where it has been normally over the last several months    ROS      Objective:  Physical Exam neurovascular status found to be unchanged with patient found to have mild redness up to the midfoot right with drainage of the fifth metatarsal right foot lateral side with large abscess formation. It is localized to this area and there is no streaking up the leg and there is no increased warmth in the leg itself. The area measures approximately 1.5 cm x 1.5 cm     Assessment:   Cellulitic event with abscess of the right fifth metatarsal with patient having long-term diabetes with neuropathic changes      Plan:  H&P x-rays reviewed condition discussed. Using sterile sharp instrumentation complete debridement of the area was accomplished and I was able to get quite a bit of pus from the area which was cultured. It was. When it appears to be staph and I did probe and did not note any current bone exposure. There is quite a bit of damage to the tissue in this area but it is localized. At this point we are start her on Augmentin and Cipro and soaks and I gave strict instructions of any fever should occur any increased redness any leg swelling she is to go immediately to the emergency room and she understands is a very good chance with this event that she is can lose her fifth digit and fifth metatarsal depending on how it responds to antibiotics. Patient is completely aware of this and will be seen back Monday or earlier if any other issues should  occur   X-rays indicate there is some soft tissue edema but no indications currently of bone lysis

## 2016-11-27 ENCOUNTER — Encounter: Payer: Self-pay | Admitting: Podiatry

## 2016-11-27 ENCOUNTER — Ambulatory Visit (INDEPENDENT_AMBULATORY_CARE_PROVIDER_SITE_OTHER): Payer: BLUE CROSS/BLUE SHIELD | Admitting: Podiatry

## 2016-11-27 DIAGNOSIS — L02619 Cutaneous abscess of unspecified foot: Secondary | ICD-10-CM

## 2016-11-27 DIAGNOSIS — L03119 Cellulitis of unspecified part of limb: Secondary | ICD-10-CM

## 2016-11-27 LAB — WOUND CULTURE: GRAM STAIN: NONE SEEN

## 2016-11-27 LAB — CBC WITH DIFFERENTIAL/PLATELET
BASOS ABS: 105 {cells}/uL (ref 0–200)
Basophils Relative: 1 %
EOS ABS: 315 {cells}/uL (ref 15–500)
EOS PCT: 3 %
HCT: 34.9 % — ABNORMAL LOW (ref 35.0–45.0)
HEMOGLOBIN: 11.2 g/dL — AB (ref 11.7–15.5)
LYMPHS ABS: 3045 {cells}/uL (ref 850–3900)
Lymphocytes Relative: 29 %
MCH: 26.9 pg — AB (ref 27.0–33.0)
MCHC: 32.1 g/dL (ref 32.0–36.0)
MCV: 83.7 fL (ref 80.0–100.0)
MPV: 10 fL (ref 7.5–12.5)
Monocytes Absolute: 735 cells/uL (ref 200–950)
Monocytes Relative: 7 %
NEUTROS ABS: 6300 {cells}/uL (ref 1500–7800)
Neutrophils Relative %: 60 %
Platelets: 392 10*3/uL (ref 140–400)
RBC: 4.17 MIL/uL (ref 3.80–5.10)
RDW: 14 % (ref 11.0–15.0)
WBC: 10.5 10*3/uL (ref 3.8–10.8)

## 2016-11-27 MED ORDER — LEVOFLOXACIN 500 MG PO TABS: 500.0000 mg | ORAL_TABLET | Freq: Every day | ORAL | 0 refills | Status: DC

## 2016-11-27 NOTE — Progress Notes (Signed)
Subjective:    Patient ID: Olivia Romero, female   DOB: 61 y.o.   MRN: [id number]   HPI patient states my foot looks a little bit better but I have run a fever several times over the last few days but overall I'm feeling okay. Patient states she did take it easy over the weekend    ROS      Objective:  Physical Exam neurovascular status unchanged with continued forefoot edema but diminished calor of the foot. The breakdown of tissue on the lateral side has improved quite a bit and has filled in with crusted tissue and no active drainage noted currently. There is minimal discomfort when I palpated around the area     Assessment:    Improving area right with continued swelling of the forefoot which is of concern with cultures indicating pseudomonas type infection     Plan:    H&P condition reviewed and discussed case with Dr. Jacqualyn Posey who saw the patient at this time also. We'll both in agreement that we'll switch her over to Levaquin and see if she gets better response orally but there is a very good chance she's getting need several days of IV antibiotics and still may end up requiring surgery for digital amputation along with metatarsal removal. We will monitor over this the next 3 days and I gave her strict instructions of any changes were to occur or any indications systemic infection I want her to go straight to the hospital. Patient promises me she will do this and while she has not taken her temperature regularly over the last few days I'm encouraging her to take it at this time on a approximate every hour basis. I also did sent for blood work to see what her CBC looks like and sedimentation rate

## 2016-11-28 LAB — SEDIMENTATION RATE: SED RATE: 55 mm/h — AB (ref 0–30)

## 2016-11-29 ENCOUNTER — Ambulatory Visit (INDEPENDENT_AMBULATORY_CARE_PROVIDER_SITE_OTHER): Payer: BLUE CROSS/BLUE SHIELD | Admitting: Obstetrics and Gynecology

## 2016-11-29 ENCOUNTER — Telehealth: Payer: Self-pay | Admitting: Podiatry

## 2016-11-29 ENCOUNTER — Encounter: Payer: Self-pay | Admitting: Obstetrics and Gynecology

## 2016-11-29 VITALS — BP 122/74 | Ht 60.0 in | Wt 206.0 lb

## 2016-11-29 DIAGNOSIS — Z1231 Encounter for screening mammogram for malignant neoplasm of breast: Secondary | ICD-10-CM | POA: Diagnosis not present

## 2016-11-29 DIAGNOSIS — Z01419 Encounter for gynecological examination (general) (routine) without abnormal findings: Secondary | ICD-10-CM

## 2016-11-29 DIAGNOSIS — Z124 Encounter for screening for malignant neoplasm of cervix: Secondary | ICD-10-CM

## 2016-11-29 DIAGNOSIS — Z1239 Encounter for other screening for malignant neoplasm of breast: Secondary | ICD-10-CM

## 2016-11-29 DIAGNOSIS — Z1151 Encounter for screening for human papillomavirus (HPV): Secondary | ICD-10-CM

## 2016-11-29 DIAGNOSIS — N898 Other specified noninflammatory disorders of vagina: Secondary | ICD-10-CM

## 2016-11-29 NOTE — Progress Notes (Signed)
Chief Complaint  Patient presents with  . Annual Exam    HPI:      [name] is a 61 y.o. No obstetric history on file. who LMP was No LMP recorded. Patient has had a hysterectomy., presents today for her NP annual examination (last seen 2012).  Her menses are absent due to [facility]. She does not have intermenstrual bleeding. She does have vasomotor sx but thinks it's related to her DM.   Sex activity: not sexually active. She does have vaginal dryness.  Last Pap: November 17, 2010  Results were: no abnormalities /neg HPV DNA.   Last mammogram: Oct 17, 2016  Results were: normal--routine follow-up in 12 months There is no FH of breast cancer. There is no FH of ovarian cancer. The patient does do self-breast exams.  Colonoscopy: colonoscopy 1 years ago with abnormalities. Repeat due in 3 yrs due to polyps.  Tobacco use: The patient denies current or previous tobacco use. Alcohol use: none Exercise: moderately active  She does get adequate calcium and Vitamin D in her diet. She is followed by multiple MDs.  Past Medical History:  Diagnosis Date  . Anemia   . Arthritis   . Asthma   . Carpal tunnel syndrome on right   . Colon polyps    adenomatous  . Diabetes mellitus without complication ([place])   . Diverticulosis of colon   . Esophagitis   . GERD (gastroesophageal reflux disease)   . Hemorrhoid    internal  . Hyperlipemia   . Hypertension   . Neuropathy   . Pneumonia   . PONV (postoperative nausea and vomiting)   . Sleep apnea    has had in the past lost 50 pounds and do longer uses cpap  . Stroke-like episode ([place]) 2009   TIA    Past Surgical History:  Procedure Laterality Date  . ABDOMINAL HYSTERECTOMY    . BACK SURGERY  09/02/2015  . CERVICAL FUSION    . CESAREAN SECTION  1986  . COLONOSCOPY N/A 11/30/2012   Procedure: COLONOSCOPY;  Surgeon: Irene Shipper, MD;  Location: [facility] ENDOSCOPY;  Service: Endoscopy;  Laterality: N/A;  . COLONOSCOPY WITH  PROPOFOL N/A 01/03/2016   Procedure: COLONOSCOPY WITH PROPOFOL;  Surgeon: Manya Silvas, MD;  Location: [facility] ENDOSCOPY;  Service: Endoscopy;  Laterality: N/A;  . ESOPHAGOGASTRODUODENOSCOPY (EGD) WITH PROPOFOL N/A 01/14/2015   Procedure: ESOPHAGOGASTRODUODENOSCOPY (EGD) WITH PROPOFOL;  Surgeon: Manya Silvas, MD;  Location: [facility] ENDOSCOPY;  Service: Endoscopy;  Laterality: N/A;  . LUMBAR WOUND DEBRIDEMENT N/A 10/01/2015   Procedure: LUMBAR WOUND DEBRIDEMENT;  Surgeon: Ashok Pall, MD;  Location: [facility] NEURO ORS;  Service: Neurosurgery;  Laterality: N/A;  LUMBAR WOUND DEBRIDEMENT  . SAVORY DILATION N/A 01/14/2015   Procedure: SAVORY DILATION;  Surgeon: Manya Silvas, MD;  Location: [facility] ENDOSCOPY;  Service: Endoscopy;  Laterality: N/A;  . TONSILLECTOMY      Family History  Problem Relation Age of Onset  . Lung cancer Father   . Hypertension Father   . Arthritis Father   . Other Mother        hardening of the arteries/renal cell carcenoma  . Hypertension Mother   . Stroke Mother   . Kidney cancer Mother   . Arthritis Brother   . Rheum arthritis Maternal Uncle   . Bladder Cancer Neg Hx     Social History   Social History  . Marital status: Single    Spouse name: N/A  . Number of  children: 1  . 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 Smoking status: Never Smoker  . Smokeless tobacco: Never Used  . Alcohol use No     Comment: wine every 6-8 months  . Drug use: No  . Sexual activity: No   Other Topics Concern  . Not on file   Social History Narrative  . No narrative on file     Current Outpatient Prescriptions:  .  acetaminophen (TYLENOL) 325 MG tablet, Take by mouth., Disp: , Rfl:  .  albuterol (PROVENTIL HFA;VENTOLIN HFA) 108 (90 Base) MCG/ACT inhaler, Inhale 2 puffs into the lungs every 6 (six) hours as needed for wheezing or shortness of breath., Disp: 1 Inhaler, Rfl: 3 .  amoxicillin-clavulanate  (AUGMENTIN) 875-125 MG tablet, Take 1 tablet by mouth 2 (two) times daily., Disp: 20 tablet, Rfl: 0 .  aspirin 325 MG tablet, Take 325 mg by mouth daily., Disp: , Rfl:  .  balsalazide (COLAZAL) 750 MG capsule, Take by mouth., Disp: , Rfl:  .  canagliflozin (INVOKANA) 100 MG TABS tablet, Invokana 100 mg tablet, Disp: , Rfl:  .  Choline Fenofibrate (TRILIPIX) 135 MG capsule, Trilipix 135 mg capsule,delayed release, Disp: , Rfl:  .  ciprofloxacin (CIPRO) 500 MG tablet, Take 1 tablet (500 mg total) by mouth 2 (two) times daily., Disp: 20 tablet, Rfl: 0 .  cyclobenzaprine (FLEXERIL) 10 MG tablet, cyclobenzaprine 10 mg tablet  Take 1 tablet qhs by oral route prn spasm., Disp: , Rfl:  .  DULoxetine (CYMBALTA) 60 MG capsule, Take by mouth., Disp: , Rfl:  .  gabapentin (NEURONTIN) 400 MG capsule, Take 400 mg by mouth 4 (four) times daily. Reported on 12/13/2015, Disp: , Rfl:  .  glimepiride (AMARYL) 2 MG tablet, , Disp: , Rfl:  .  HYDROcodone-acetaminophen (NORCO/VICODIN) 5-325 MG tablet, Take by mouth., Disp: , Rfl:  .  hyoscyamine (LEVSIN SL) 0.125 MG SL tablet, Place under the tongue., Disp: , Rfl:  .  Icosapent Ethyl (VASCEPA) 1 g CAPS, Take 2,000 mg by mouth 2 (two) times daily., Disp: 360 capsule, Rfl: 1 .  levofloxacin (LEVAQUIN) 500 MG tablet, Take 1 tablet (500 mg total) by mouth daily., Disp: 7 tablet, Rfl: 0 .  liraglutide (VICTOZA) 18 MG/3ML SOPN, Victoza 3-Pak 0.6 mg/0.1 mL (18 mg/3 mL) subcutaneous pen injector, Disp: , Rfl:  .  lisinopril (PRINIVIL,ZESTRIL) 40 MG tablet, Take 1 tablet (40 mg total) by mouth daily., Disp: 90 tablet, Rfl: 1 .  meloxicam (MOBIC) 15 MG tablet, Take by mouth., Disp: , Rfl:  .  metFORMIN (GLUCOPHAGE) 500 MG tablet, Take 2 tablets (1,000 mg total) by mouth 2 (two) times daily with a meal., Disp: 120 tablet, Rfl: 3 .  metoCLOPramide (REGLAN) 10 MG tablet, Take 1 tablet (10 mg total) by mouth 2 (two) times daily as needed for nausea or vomiting., Disp: 180 tablet, Rfl:  0 .  moxifloxacin (AVELOX) 400 MG tablet, moxifloxacin 400 mg tablet, Disp: , Rfl:  .  Multiple Vitamin (MULTIVITAMIN WITH MINERALS) TABS, Take 1 tablet by mouth daily. Reported on 07/21/2015, Disp: , Rfl:  .  niacin (NIASPAN) 500 MG CR tablet, Niaspan 500 mg tablet,extended release, Disp: , Rfl:  .  omeprazole (PRILOSEC) 20 MG capsule, Take 20 mg by mouth daily., Disp: , Rfl:  .  OxyCODONE HCl, Abuse Deter, (OXAYDO) 5 MG TABA, oxycodone 5 mg tablet, Disp: , Rfl:  .  tobramycin-dexamethasone (  TOBRADEX) ophthalmic solution, tobramycin 0.3 %-dexamethasone 0.1 % eye drops,suspension, Disp: , Rfl:  .  torsemide (DEMADEX) 20 MG tablet, Take 1 tablet (20 mg total) by mouth every other day., Disp: 15 tablet, Rfl: 2 .  Vitamin D, Ergocalciferol, (DRISDOL) 50000 units CAPS capsule, Take 1 capsule (50,000 Units total) by mouth once a week. For 12 weeks, Disp: 12 capsule, Rfl: 0   ROS:  Review of Systems  Constitutional: Negative for fatigue, fever and unexpected weight change.  Respiratory: Negative for cough, shortness of breath and wheezing.   Cardiovascular: Negative for chest pain, palpitations and leg swelling.  Gastrointestinal: Negative for blood in stool, constipation, diarrhea, nausea and vomiting.  Endocrine: Negative for cold intolerance, heat intolerance and polyuria.  Genitourinary: Negative for dyspareunia, dysuria, flank pain, frequency, genital sores, hematuria, menstrual problem, pelvic pain, urgency, vaginal bleeding, vaginal discharge and vaginal pain.  Musculoskeletal: Negative for back pain, joint swelling and myalgias.  Skin: Negative for rash.  Neurological: Negative for dizziness, syncope, light-headedness, numbness and headaches.  Hematological: Negative for adenopathy.  Psychiatric/Behavioral: Negative for agitation, confusion, sleep disturbance and suicidal ideas. The patient is not nervous/anxious.      Objective: BP 122/74   Ht 5' (1.524 m)   Wt 206 lb (93.4 kg)    BMI 40.23 kg/m    Physical Exam  Constitutional: She is oriented to person, place, and time. She appears well-developed and well-nourished.  Genitourinary: Vagina normal. There is no rash or tenderness on the right labia. There is no rash or tenderness on the left labia. No erythema or tenderness in the vagina. No vaginal discharge found. Right adnexum does not display mass and does not display tenderness. Left adnexum does not display mass and does not display tenderness.  Genitourinary Comments: UTERUS/CX SURG ABSENT  Neck: Normal range of motion. No thyromegaly present.  Cardiovascular: Normal rate, regular rhythm and normal heart sounds.   No murmur heard. Pulmonary/Chest: Effort normal and breath sounds normal. Right breast exhibits no mass, no nipple discharge, no skin change and no tenderness. Left breast exhibits no mass, no nipple discharge, no skin change and no tenderness.  Abdominal: Soft. There is no tenderness. There is no guarding.  Musculoskeletal: Normal range of motion.  Neurological: She is alert and oriented to person, place, and time. No cranial nerve deficit.  Psychiatric: She has a normal mood and affect. Her behavior is normal.  Vitals reviewed.   Assessment/Plan:  Encounter for annual routine gynecological examination  Cervical cancer screening - Plan: IGP, Aptima HPV  Screening for HPV (human papillomavirus) - Plan: IGP, Aptima HPV  Screening for breast cancer - Pt current on mammos with PCP.  Vaginal dryness - Try coconut oil as moisturizer. Not sex active.          GYN counsel mammography screening, adequate intake of calcium and vitamin D, diet and exercise     F/U  Return in about 1 year (around 11/29/2017).  Lavern Maslow B. Elijahjames Fuelling, PA-C 11/29/2016 2:36 PM

## 2016-11-29 NOTE — Telephone Encounter (Signed)
Pt states she called and left message yesterday about her medication she was given by Dr Paulla Dolly. She states she did not tell us she has started insulin and wanted to make sure it was ok to take the new medication with the insulin.

## 2016-11-29 NOTE — Telephone Encounter (Signed)
I informed pt the Augmentin should not have a reaction with the insulin, but to monitor blood sugars due to the infection.

## 2016-11-30 ENCOUNTER — Ambulatory Visit (INDEPENDENT_AMBULATORY_CARE_PROVIDER_SITE_OTHER): Payer: BLUE CROSS/BLUE SHIELD

## 2016-11-30 ENCOUNTER — Encounter: Payer: Self-pay | Admitting: Podiatry

## 2016-11-30 ENCOUNTER — Ambulatory Visit (INDEPENDENT_AMBULATORY_CARE_PROVIDER_SITE_OTHER): Payer: BLUE CROSS/BLUE SHIELD | Admitting: Podiatry

## 2016-11-30 VITALS — BP 124/71 | HR 99 | Resp 16

## 2016-11-30 DIAGNOSIS — L03119 Cellulitis of unspecified part of limb: Secondary | ICD-10-CM

## 2016-11-30 DIAGNOSIS — L02619 Cutaneous abscess of unspecified foot: Secondary | ICD-10-CM

## 2016-12-01 LAB — IGP, APTIMA HPV
HPV Aptima: NEGATIVE
PAP SMEAR COMMENT: 0

## 2016-12-01 NOTE — Progress Notes (Signed)
Subjective:    Patient ID: Olivia Romero, female   DOB: 61 y.o.   MRN: [id number]   HPI patient presents stating that she has not had a temperature the last few days and her foot looks better    ROS      Objective:  Physical Exam neurovascular status was found to be unchanged with edema still noted forefoot right but there is no increased calor noted and it has reduced quite a bit from several days ago. The redundant tissue on the lateral side of the foot is improving with no active drainage noted or proximal erythema no odor emitting from the wound. Patient states that she no longer has any achiness or feels like she has to fluid     Assessment:    Patient with probable cellulitic event right appears to be improving with healing still to occur and continuing antibiotics     Plan:    H&P x-rays reviewed. I reviewed blood work indicating elevated sedimentation rate but everything else was in within normal limits and I advised this patient now on continuation of the same treatment continuing antibiotics continue with open toed shoe and will be seen back next week with strict instructions if there should be temperature or other changes indicating systemic infection she is to go straight to the emergency room for IV antibiotics  X-rays indicate no lysis formation at the current time on the bone structure

## 2016-12-04 ENCOUNTER — Telehealth: Payer: Self-pay

## 2016-12-04 NOTE — Telephone Encounter (Signed)
Spoke with pt regarding refill of antibiotic medications. She indicated that her foot was improving, and that she had finished all her antibiotics. I advised her to continue to monitor her foot for s/s of infection and that Dr Paulla Dolly will evaluate need for further antibiotic treatment at her visit this Thursday. She is to report any acute symptom changes

## 2016-12-04 NOTE — Telephone Encounter (Signed)
I need a refill on the Levaquin 500 MG tablet and Augmentin 875-125 MG tablet. I thought I had enough but I took the last pill of both this morning.

## 2016-12-07 ENCOUNTER — Ambulatory Visit (INDEPENDENT_AMBULATORY_CARE_PROVIDER_SITE_OTHER): Payer: BLUE CROSS/BLUE SHIELD | Admitting: Podiatry

## 2016-12-07 ENCOUNTER — Encounter: Payer: Self-pay | Admitting: Podiatry

## 2016-12-07 ENCOUNTER — Ambulatory Visit (INDEPENDENT_AMBULATORY_CARE_PROVIDER_SITE_OTHER): Payer: BLUE CROSS/BLUE SHIELD

## 2016-12-07 VITALS — BP 122/76 | HR 95 | Temp 98.8°F | Resp 16

## 2016-12-07 DIAGNOSIS — L02619 Cutaneous abscess of unspecified foot: Secondary | ICD-10-CM

## 2016-12-07 DIAGNOSIS — L03119 Cellulitis of unspecified part of limb: Secondary | ICD-10-CM | POA: Diagnosis not present

## 2016-12-07 MED ORDER — LEVOFLOXACIN 500 MG PO TABS: 500.0000 mg | ORAL_TABLET | Freq: Every day | ORAL | 0 refills | Status: DC

## 2016-12-07 MED ORDER — AMOXICILLIN-POT CLAVULANATE 875-125 MG PO TABS: 1.0000 | ORAL_TABLET | Freq: Two times a day (BID) | ORAL | 0 refills | Status: DC

## 2016-12-08 NOTE — Progress Notes (Signed)
Subjective:    Patient ID: Olivia Romero, female   DOB: 61 y.o.   MRN: [id number]   HPI patient states seems to be doing well with the right foot and states that she did have some sharp pains between does not appear to have the redness and has had no fever since she was seen last    ROS      Objective:  Physical Exam neurovascular status unchanged with a area that is closing up on the right fifth metatarsal measuring about 1 cm x 8 mm with no active drainage and healthy pinkish type tissue covering the entire area. No active drainage was noted     Assessment:   Appears to be doing better from having had infection around the right fifth metatarsal with no proximal indications of infection      Plan:    Advised on the continuation of soaks and bandage usage. Since there is some forefoot edema and it does not appear to have any erythema quality to it I did go ahead and applied an Unna boot for 3 days to try to reduce the swelling and gave her strict instructions if any systemic signs of infection were to occur to take it off immediately and go to the emergency room. Did discuss she still may need IV antibiotics and still may in the N require amputation but at this point she appears to be healing well and I reviewed her x-rays and also dispensed a wedge shoe to take pressure off the bottom of the foot. Continue Levaquin and Augmentin and reappoint to recheck again in 1 week or earlier if needed  X-rays indicated no signs currently that there is osteolytic event occurring and it appears to be strictly soft tissue

## 2016-12-11 ENCOUNTER — Telehealth: Payer: Self-pay | Admitting: *Deleted

## 2016-12-11 NOTE — Telephone Encounter (Signed)
Pt left message and phone number no name. Pt states she had to redress her foot today, the dressing looked terrible and so did the foot, would like an appt. Left message to call for an appt and then have the schedulers transfer to me to discuss.

## 2016-12-12 NOTE — Telephone Encounter (Signed)
Yes, hello I left a message yesterday with concerns about my foot. I have an appointment already scheduled for this Thursday. I was very concerned about my foot yesterday and it looks about the same today but I think I'll be okay until Thursday. If I have any questions or concerns I will call you back. Thank you.

## 2016-12-14 ENCOUNTER — Emergency Department (HOSPITAL_COMMUNITY): Payer: BLUE CROSS/BLUE SHIELD

## 2016-12-14 ENCOUNTER — Inpatient Hospital Stay (HOSPITAL_COMMUNITY)
Admission: EM | Admit: 2016-12-14 | Discharge: 2016-12-16 | DRG: 638 | Disposition: A | Discharge status: Home / Self Care | Payer: BLUE CROSS/BLUE SHIELD | Attending: Internal Medicine | Admitting: Internal Medicine

## 2016-12-14 ENCOUNTER — Ambulatory Visit (INDEPENDENT_AMBULATORY_CARE_PROVIDER_SITE_OTHER): Payer: BLUE CROSS/BLUE SHIELD | Admitting: Podiatry

## 2016-12-14 ENCOUNTER — Ambulatory Visit (INDEPENDENT_AMBULATORY_CARE_PROVIDER_SITE_OTHER): Payer: BLUE CROSS/BLUE SHIELD

## 2016-12-14 ENCOUNTER — Encounter (HOSPITAL_COMMUNITY): Payer: Self-pay | Admitting: Emergency Medicine

## 2016-12-14 ENCOUNTER — Inpatient Hospital Stay (HOSPITAL_COMMUNITY): Payer: BLUE CROSS/BLUE SHIELD

## 2016-12-14 ENCOUNTER — Telehealth: Payer: Self-pay | Admitting: *Deleted

## 2016-12-14 ENCOUNTER — Encounter: Payer: Self-pay | Admitting: Podiatry

## 2016-12-14 VITALS — BP 85/58 | HR 99 | Temp 99.0°F | Resp 16

## 2016-12-14 DIAGNOSIS — T814XXA Infection following a procedure, initial encounter: Secondary | ICD-10-CM | POA: Diagnosis present

## 2016-12-14 DIAGNOSIS — Z8673 Personal history of transient ischemic attack (TIA), and cerebral infarction without residual deficits: Secondary | ICD-10-CM

## 2016-12-14 DIAGNOSIS — M869 Osteomyelitis, unspecified: Secondary | ICD-10-CM | POA: Diagnosis present

## 2016-12-14 DIAGNOSIS — Z90710 Acquired absence of both cervix and uterus: Secondary | ICD-10-CM

## 2016-12-14 DIAGNOSIS — G4733 Obstructive sleep apnea (adult) (pediatric): Secondary | ICD-10-CM | POA: Diagnosis present

## 2016-12-14 DIAGNOSIS — L97519 Non-pressure chronic ulcer of other part of right foot with unspecified severity: Secondary | ICD-10-CM | POA: Diagnosis present

## 2016-12-14 DIAGNOSIS — Z823 Family history of stroke: Secondary | ICD-10-CM | POA: Diagnosis not present

## 2016-12-14 DIAGNOSIS — Z86010 Personal history of colonic polyps: Secondary | ICD-10-CM

## 2016-12-14 DIAGNOSIS — M7731 Calcaneal spur, right foot: Secondary | ICD-10-CM | POA: Diagnosis present

## 2016-12-14 DIAGNOSIS — Z6841 Body Mass Index (BMI) 40.0 and over, adult: Secondary | ICD-10-CM | POA: Diagnosis not present

## 2016-12-14 DIAGNOSIS — R609 Edema, unspecified: Secondary | ICD-10-CM | POA: Diagnosis present

## 2016-12-14 DIAGNOSIS — F329 Major depressive disorder, single episode, unspecified: Secondary | ICD-10-CM | POA: Diagnosis present

## 2016-12-14 DIAGNOSIS — Z91012 Allergy to eggs: Secondary | ICD-10-CM

## 2016-12-14 DIAGNOSIS — L03119 Cellulitis of unspecified part of limb: Secondary | ICD-10-CM

## 2016-12-14 DIAGNOSIS — L97509 Non-pressure chronic ulcer of other part of unspecified foot with unspecified severity: Secondary | ICD-10-CM

## 2016-12-14 DIAGNOSIS — E1165 Type 2 diabetes mellitus with hyperglycemia: Secondary | ICD-10-CM | POA: Diagnosis present

## 2016-12-14 DIAGNOSIS — E785 Hyperlipidemia, unspecified: Secondary | ICD-10-CM | POA: Diagnosis present

## 2016-12-14 DIAGNOSIS — E1142 Type 2 diabetes mellitus with diabetic polyneuropathy: Secondary | ICD-10-CM | POA: Diagnosis present

## 2016-12-14 DIAGNOSIS — I10 Essential (primary) hypertension: Secondary | ICD-10-CM | POA: Diagnosis present

## 2016-12-14 DIAGNOSIS — L02619 Cutaneous abscess of unspecified foot: Secondary | ICD-10-CM

## 2016-12-14 DIAGNOSIS — Z887 Allergy status to serum and vaccine status: Secondary | ICD-10-CM

## 2016-12-14 DIAGNOSIS — M8438XA Stress fracture, other site, initial encounter for fracture: Secondary | ICD-10-CM | POA: Diagnosis present

## 2016-12-14 DIAGNOSIS — Z8249 Family history of ischemic heart disease and other diseases of the circulatory system: Secondary | ICD-10-CM | POA: Diagnosis not present

## 2016-12-14 DIAGNOSIS — E781 Pure hyperglyceridemia: Secondary | ICD-10-CM | POA: Diagnosis present

## 2016-12-14 DIAGNOSIS — Z888 Allergy status to other drugs, medicaments and biological substances status: Secondary | ICD-10-CM

## 2016-12-14 DIAGNOSIS — E11621 Type 2 diabetes mellitus with foot ulcer: Principal | ICD-10-CM

## 2016-12-14 DIAGNOSIS — J45909 Unspecified asthma, uncomplicated: Secondary | ICD-10-CM | POA: Diagnosis present

## 2016-12-14 DIAGNOSIS — E1169 Type 2 diabetes mellitus with other specified complication: Secondary | ICD-10-CM | POA: Diagnosis present

## 2016-12-14 DIAGNOSIS — L03115 Cellulitis of right lower limb: Secondary | ICD-10-CM | POA: Diagnosis present

## 2016-12-14 DIAGNOSIS — K219 Gastro-esophageal reflux disease without esophagitis: Secondary | ICD-10-CM | POA: Diagnosis present

## 2016-12-14 DIAGNOSIS — X58XXXA Exposure to other specified factors, initial encounter: Secondary | ICD-10-CM | POA: Diagnosis present

## 2016-12-14 DIAGNOSIS — IMO0002 Reserved for concepts with insufficient information to code with codable children: Secondary | ICD-10-CM

## 2016-12-14 DIAGNOSIS — Z7982 Long term (current) use of aspirin: Secondary | ICD-10-CM

## 2016-12-14 DIAGNOSIS — L03818 Cellulitis of other sites: Secondary | ICD-10-CM | POA: Diagnosis not present

## 2016-12-14 DIAGNOSIS — Z79899 Other long term (current) drug therapy: Secondary | ICD-10-CM

## 2016-12-14 DIAGNOSIS — Z91040 Latex allergy status: Secondary | ICD-10-CM

## 2016-12-14 LAB — I-STAT CG4 LACTIC ACID, ED
LACTIC ACID, VENOUS: 1.89 mmol/L (ref 0.5–1.9)
Lactic Acid, Venous: 2.56 mmol/L (ref 0.5–1.9)

## 2016-12-14 LAB — URINALYSIS, ROUTINE W REFLEX MICROSCOPIC
BILIRUBIN URINE: NEGATIVE
Glucose, UA: NEGATIVE mg/dL
Hgb urine dipstick: NEGATIVE
Ketones, ur: NEGATIVE mg/dL
Nitrite: NEGATIVE
PROTEIN: NEGATIVE mg/dL
SPECIFIC GRAVITY, URINE: 1.016 (ref 1.005–1.030)
pH: 5 (ref 5.0–8.0)

## 2016-12-14 LAB — CBC
HEMATOCRIT: 36.4 % (ref 36.0–46.0)
HEMOGLOBIN: 11.6 g/dL — AB (ref 12.0–15.0)
MCH: 27.2 pg (ref 26.0–34.0)
MCHC: 31.9 g/dL (ref 30.0–36.0)
MCV: 85.4 fL (ref 78.0–100.0)
PLATELETS: 387 10*3/uL (ref 150–400)
RBC: 4.26 MIL/uL (ref 3.87–5.11)
RDW: 13.5 % (ref 11.5–15.5)
WBC: 9.6 10*3/uL (ref 4.0–10.5)

## 2016-12-14 LAB — COMPREHENSIVE METABOLIC PANEL
ALT: 13 U/L — ABNORMAL LOW (ref 14–54)
AST: 17 U/L (ref 15–41)
Albumin: 3.7 g/dL (ref 3.5–5.0)
Alkaline Phosphatase: 60 U/L (ref 38–126)
Anion gap: 10 (ref 5–15)
BUN: 18 mg/dL (ref 6–20)
CHLORIDE: 104 mmol/L (ref 101–111)
CO2: 23 mmol/L (ref 22–32)
CREATININE: 0.68 mg/dL (ref 0.44–1.00)
Calcium: 9.8 mg/dL (ref 8.9–10.3)
GFR calc non Af Amer: >60 mL/min (ref >60)
Glucose, Bld: 86 mg/dL (ref 65–99)
Potassium: 4.3 mmol/L (ref 3.5–5.1)
SODIUM: 137 mmol/L (ref 135–145)
Total Bilirubin: 0.3 mg/dL (ref 0.3–1.2)
Total Protein: 7 g/dL (ref 6.5–8.1)

## 2016-12-14 LAB — GLUCOSE, CAPILLARY: GLUCOSE-CAPILLARY: 107 mg/dL — AB (ref 65–99)

## 2016-12-14 MED ORDER — ADULT MULTIVITAMIN W/MINERALS CH
1.0000 | ORAL_TABLET | Freq: Every day | ORAL | Status: DC
  Administered 2016-12-16: 1 via ORAL
  Filled 2016-12-14: qty 1

## 2016-12-14 MED ORDER — ICOSAPENT ETHYL 1 G PO CAPS: 2000.0000 mg | ORAL_CAPSULE | Freq: Two times a day (BID) | ORAL | Status: DC

## 2016-12-14 MED ORDER — CYCLOBENZAPRINE HCL 10 MG PO TABS
10.0000 mg | ORAL_TABLET | Freq: Every evening | ORAL | Status: DC | PRN
  Administered 2016-12-15: 10 mg via ORAL
  Filled 2016-12-14: qty 1

## 2016-12-14 MED ORDER — VANCOMYCIN HCL 10 G IV SOLR
1500.0000 mg | Freq: Once | INTRAVENOUS | Status: AC
  Administered 2016-12-14: 1500 mg via INTRAVENOUS
  Filled 2016-12-14: qty 1500

## 2016-12-14 MED ORDER — SODIUM CHLORIDE 0.9 % IV BOLUS (SEPSIS)
1000.0000 mL | Freq: Once | INTRAVENOUS | Status: AC
  Administered 2016-12-14: 1000 mL via INTRAVENOUS

## 2016-12-14 MED ORDER — PIPERACILLIN-TAZOBACTAM 3.375 G IVPB
3.3750 g | Freq: Three times a day (TID) | INTRAVENOUS | Status: DC
  Administered 2016-12-15 (×2): 3.375 g via INTRAVENOUS
  Filled 2016-12-14 (×4): qty 50

## 2016-12-14 MED ORDER — DULOXETINE HCL 60 MG PO CPEP
60.0000 mg | ORAL_CAPSULE | Freq: Every day | ORAL | Status: DC
  Administered 2016-12-14 – 2016-12-16 (×2): 60 mg via ORAL
  Filled 2016-12-14 (×2): qty 1

## 2016-12-14 MED ORDER — PIPERACILLIN-TAZOBACTAM 3.375 G IVPB
3.3750 g | Freq: Three times a day (TID) | INTRAVENOUS | Status: DC
  Filled 2016-12-14 (×2): qty 50

## 2016-12-14 MED ORDER — VANCOMYCIN HCL 10 G IV SOLR
1750.0000 mg | Freq: Two times a day (BID) | INTRAVENOUS | Status: DC
  Filled 2016-12-14: qty 1750

## 2016-12-14 MED ORDER — PIPERACILLIN-TAZOBACTAM 3.375 G IVPB 30 MIN
3.3750 g | Freq: Once | INTRAVENOUS | Status: AC
  Administered 2016-12-14: 3.375 g via INTRAVENOUS
  Filled 2016-12-14: qty 50

## 2016-12-14 MED ORDER — ACETAMINOPHEN 650 MG RE SUPP: 650.0000 mg | Freq: Four times a day (QID) | RECTAL | Status: DC | PRN

## 2016-12-14 MED ORDER — ASPIRIN 325 MG PO TABS
325.0000 mg | ORAL_TABLET | Freq: Every day | ORAL | Status: DC
Start: 2016-12-14 — End: 2016-12-14

## 2016-12-14 MED ORDER — INSULIN GLARGINE 100 UNIT/ML ~~LOC~~ SOLN: 15.0000 [IU] | Freq: Every day | SUBCUTANEOUS | Status: DC

## 2016-12-14 MED ORDER — VITAMIN D (ERGOCALCIFEROL) 1.25 MG (50000 UNIT) PO CAPS: 50000.0000 [IU] | ORAL_CAPSULE | ORAL | Status: DC

## 2016-12-14 MED ORDER — ALBUTEROL SULFATE HFA 108 (90 BASE) MCG/ACT IN AERS: 2.0000 | INHALATION_SPRAY | Freq: Four times a day (QID) | RESPIRATORY_TRACT | Status: DC | PRN

## 2016-12-14 MED ORDER — GABAPENTIN 400 MG PO CAPS
400.0000 mg | ORAL_CAPSULE | ORAL | Status: DC
  Administered 2016-12-14 – 2016-12-16 (×7): 400 mg via ORAL
  Filled 2016-12-14 (×8): qty 1

## 2016-12-14 MED ORDER — HEPARIN SODIUM (PORCINE) 5000 UNIT/ML IJ SOLN
5000.0000 [IU] | Freq: Three times a day (TID) | INTRAMUSCULAR | Status: DC
Start: 2016-12-14 — End: 2016-12-16
  Administered 2016-12-14 – 2016-12-16 (×5): 5000 [IU] via SUBCUTANEOUS
  Filled 2016-12-14 (×5): qty 1

## 2016-12-14 MED ORDER — PANTOPRAZOLE SODIUM 40 MG PO TBEC: 40.0000 mg | DELAYED_RELEASE_TABLET | Freq: Every day | ORAL | Status: DC

## 2016-12-14 MED ORDER — ACETAMINOPHEN 325 MG PO TABS
650.0000 mg | ORAL_TABLET | Freq: Four times a day (QID) | ORAL | Status: DC | PRN
  Administered 2016-12-15 (×2): 650 mg via ORAL
  Filled 2016-12-14 (×2): qty 2

## 2016-12-14 MED ORDER — VANCOMYCIN HCL IN DEXTROSE 750-5 MG/150ML-% IV SOLN
750.0000 mg | Freq: Once | INTRAVENOUS | Status: DC
  Filled 2016-12-14: qty 150

## 2016-12-14 MED ORDER — INSULIN ASPART 100 UNIT/ML ~~LOC~~ SOLN
0.0000 [IU] | Freq: Three times a day (TID) | SUBCUTANEOUS | Status: DC
  Administered 2016-12-16: 3 [IU] via SUBCUTANEOUS

## 2016-12-14 MED ORDER — ASPIRIN EC 81 MG PO TBEC
81.0000 mg | DELAYED_RELEASE_TABLET | Freq: Every day | ORAL | Status: DC
  Administered 2016-12-14 – 2016-12-16 (×2): 81 mg via ORAL
  Filled 2016-12-14 (×2): qty 1

## 2016-12-14 MED ORDER — ALBUTEROL SULFATE (2.5 MG/3ML) 0.083% IN NEBU: 2.5000 mg | INHALATION_SOLUTION | Freq: Four times a day (QID) | RESPIRATORY_TRACT | Status: DC | PRN

## 2016-12-14 NOTE — H&P (Signed)
Date: 12/14/2016               Patient Name:  Olivia Romero MRN: [id number]  DOB: 1956-05-15 Age / Sex: 61 y.o., female   PCP: Ashok Norris, MD         Medical Service: Internal Medicine Teaching Service         Attending Physician: Dr. Oval Linsey, MD    First Contact: Lethea Killings Pager: [phone]  Second Contact: Dr. Shela Leff Pager: [phone]       After Hours (After 5p/  First Contact Pager: [phone]  weekends / holidays): Second Contact Pager: [phone]   Chief Complaint: wound on foot  History of Present Illness: Patient is a 61 year old with history of poorly contolled diabetes, diabetic polyneuropathy, familial hypertriglyceridemia, HTN, and TIA in 2009 who presents with a right foot wound present since August 2017 that has been accompanied by worsening pain and new-onset fevers for 3 weeks. The patient is unable to endorse a clear history of trauma or inciting event that led to the wound. She states that the wound was initially small and located medially but has enlarged and spread to the lateral aspect of her right foot. She has been followed by a podiatrist for management of this wound for several months and has undergone regular wound care and periodic debridement. About 3 weeks ago, she started to experience subjective fevers and chills throughout her body. She had wound cultures obtained at that time that grew pseudomonas. The podiatrist started her on augmentin and ciprofloxacin for a 2 week course. Last week, she experienced persistent chills despite taking the antibiotics as directed and was switched from cipro to levo, which she has been taking as directed for one week now. Yesterday morning, she experienced chills again and per family appeared pale and diaphoretic. She has not measured her temperature during these events. Due to concerning fevers she came to the ED.   Given her significant diabetic neuropathy the patient has minimal sensation in her  right leg and foot. However she says she was able to notice some shooting pain coming from her right foot yesterday. The patient has not had problems bearing weight on this foot. Denies pain in her heel/ankle. She has been using an orthotic shoe prescribed by her podiatrist.  Of note, the patient also complains of new-onset left lower leg pain that is worsened by bearing weight on that leg. The patient believes she has been altering her gait due to the right-sided foot wound and that this may have caused increased pressure on her other leg. She has had significant difficulty walking due to this pain in her left lower leg.  The patient endorses subjective fevers, chills, and headache. She denies anorexia and says she has been eating and drinking well. She denies visual changes, chest pain, shortness of breath, abdominal pain, cough, wheezing, N/V, diarrhea, constipation or other systemic symptoms.   Meds:  Current Meds  Medication Sig  . acetaminophen (TYLENOL) 500 MG tablet Take 1,000 mg by mouth every 6 (six) hours as needed for mild pain.   [name] albuterol (PROVENTIL HFA;VENTOLIN HFA) 108 (90 Base) MCG/ACT inhaler Inhale 2 puffs into the lungs every 6 (six) hours as needed for wheezing or shortness of breath.  [name] aspirin 325 MG tablet Take 325 mg by mouth daily.  . DULoxetine (CYMBALTA) 60 MG capsule Take 60 mg by mouth at bedtime.   . gabapentin (NEURONTIN) 400 MG capsule Take 400 mg by mouth 6 (  six) times daily. Reported on 12/13/2015  . glimepiride (AMARYL) 2 MG tablet Take 2 mg by mouth 2 (two) times daily.      Allergies: Allergies as of 12/14/2016 - Review Complete 12/14/2016  Allergen Reaction Noted  . Eggs or egg-derived products Other (See Comments) 12/26/2014  . Atorvastatin Other (See Comments) 11/27/2012  . Flu virus vaccine Other (See Comments) 02/01/2015  . Latex Itching 11/27/2012  . Pravastatin Itching and Rash 11/27/2012   Past Medical History:  Diagnosis Date  . Anemia   .  Arthritis   . Asthma   . Carpal tunnel syndrome on right   . Colon polyps    adenomatous  . Diabetes mellitus without complication ([place])   . Diverticulosis of colon   . Esophagitis   . GERD (gastroesophageal reflux disease)   . Hemorrhoid    internal  . Hyperlipemia   . Hypertension   . Neuropathy   . Pneumonia   . PONV (postoperative nausea and vomiting)   . Sleep apnea    has had in the past lost 50 pounds and do longer uses cpap  . Stroke-like episode ([place]) 2009   TIA    Family History: Family history of hypertriglyceridemia.   Social History: Lives with extended family including daughter-in-law, one child, never smoker, no EtOH use.  Review of Systems: A complete ROS was negative except as per HPI.   Physical Exam: Blood pressure (!) 89/48, pulse 88, temperature 98.9 F (37.2 C), temperature source Oral, resp. rate 18, height 5' (1.524 m), weight 93.4 kg (206 lb), SpO2 98 %. Physical Exam  Constitutional: She is oriented to person, place, and time. She appears well-developed and well-nourished. No distress.  She does not appear toxic.  HENT:  Head: Normocephalic and atraumatic.  Mouth/Throat: No oropharyngeal exudate.  Eyes: Pupils are equal, round, and reactive to light. EOM are normal. No scleral icterus.  Neck: Neck supple. No tracheal deviation present.  Cardiovascular: Normal rate and regular rhythm.   Murmur heard. Grade 3/6 systolic murmur best heard RUSB  Pulmonary/Chest: Effort normal and breath sounds normal. No respiratory distress. She has no wheezes. She has no rales.  Abdominal: Soft. Bowel sounds are normal. She exhibits no distension. There is no tenderness. There is no rebound and no guarding.  Musculoskeletal: Normal range of motion. She exhibits tenderness. She exhibits no edema.  Point tenderness to palpation at anteromedial aspect of lower left tibia.  Neurological: She is alert and oriented to person, place, and time.  Skin: She is not  diaphoretic.  Approx. 2.5 x 2.5 cm ulcerated wound with green base and no active discharge present at medio-lateral forefoot, without appreciable visible bone. Please see image.        EKG: Not obtained   CXR: None   DG Foot Complete Right: FINDINGS: Skin ulceration along the lateral aspect of the fifth MTP joint is identified. No underlying soft tissue gas or radiopaque foreign body. No bony destructive change or periosteal reaction. No fracture or dislocation. Midfoot degenerative change and calcaneal spurring noted.  IMPRESSION: Skin ulceration adjacent to the fifth MTP joint without underlying bony abnormality.  Electronically Signed   By: Inge Rise M.D.   On: 12/14/2016 15:36  Assessment & Plan by Problem: Active Problems:   * No active hospital problems. *  (1) Diabetic foot ulcer: In the setting of poorly controlled diabetes. Last A1c 9.8 in 08/2016. Patient presented with lactate elevated to 2.56 but no fever, leukocytosis, or tachycardia. U/A benign,  bcx x 2 obtained and was started on broad spectrum vanc/zosyn, will narrow to zosyn to cover Pseudomonas. S/p 1L NS bolus. 2-view foot radiographs demonstrate no bony involvement as above, will obtain MRI to assess further after patient is transferred to floor. Patient continues to be afebrile and non-toxic appearing. Will continue antibiotics for now and consult surgery after MRI is complete for recommendations regarding debridement/wound care.  - Surgery consultation in am - MRI right foot w/ contrast after transfer to floor  - F/u A1c  - F/u bcx x 2 - Wound care  - Encourage PO intake for patient for tonight with NPO at midnight   (2) Left leg pain: Stress fracture vs medial tibial stress syndrome. Likely stress fracture given point tenderness. Will encourage rest and consult PT for recommendations.  - Pain control with flexeril and tylenol as below - PT consultation, appreciate recs   (3) Pain control:  Patient has history of significant hypotension after opioids. Uses flexeril and percocet for pain at home.  - Flexeril 10 mg PO HS PRN  - Tylenol 650 mg PO q6h PRN  (4) Poorly controlled diabetes: Last A1c 9.8 in March 2018. Uses Tresiba at home, will use SSI here. Recent POC glucose 86 at 3 pm.  - F/u A1c as above - SSI-M  (5) GERD: Will continue home protonix 40 mg QD.   (6) Diabetic polyneuropathy: Will continue home gabapentin 400 mg PO QID.  (7) Familial hypertriglyceridemia: Will continue home icosapent ethyl 2000 mg PO BID.  (8) MDD: Will continue home cymbalta 60 mg PO QD.   (9) History of TIA in 2009: Will change her home ASA 325 mg to 81 mg.  (10) ASCVD risk: Allergic reactions (rash) to both lipitor and crestor documented in the past. Will hold off on starting these in hospital until further clarification regarding reactions is obtained.  Dispo: Admit patient to Inpatient with expected length of stay greater than 2 midnights.  Signed: Lethea Killings, Medical Student 12/14/2016, 6:56 PM  Pager: [phone]  Attestation for Student Documentation:  I personally was present and performed or re-performed the history, physical exam and medical decision-making activities of this service and have verified that the service and findings are accurately documented in the student's note.  Shela Leff, MD 12/14/2016, 8:23 PM

## 2016-12-14 NOTE — ED Provider Notes (Signed)
[facility] DEPT Provider Note   CSN: [id number] Arrival date & time: 12/14/16  1444     History   Chief Complaint Chief Complaint  Patient presents with  . Wound Check    HPI Olivia Romero is a 61 y.o. female.  The history is provided by the patient.  Illness  This is a new problem. The current episode started more than 1 week ago. The problem occurs constantly. The problem has been gradually worsening. Pertinent negatives include no chest pain, no abdominal pain, no headaches and no shortness of breath. Associated symptoms comments: Right foot pain, fever. Nothing aggravates the symptoms. Nothing relieves the symptoms.    Past Medical History:  Diagnosis Date  . Anemia   . Arthritis   . Asthma   . Carpal tunnel syndrome on right   . Colon polyps    adenomatous  . Diabetes mellitus without complication ([place])   . Diverticulosis of colon   . Esophagitis   . GERD (gastroesophageal reflux disease)   . Hemorrhoid    internal  . Hyperlipemia   . Hypertension   . Neuropathy   . Pneumonia   . PONV (postoperative nausea and vomiting)   . Sleep apnea    has had in the past lost 50 pounds and do longer uses cpap  . Stroke-like episode ([place]) 2009   TIA    Patient Active Problem List   Diagnosis Date Noted  . Diabetic foot ulcer ([place]) 12/14/2016  . Diabetic peripheral neuropathy associated with type 2 diabetes mellitus ([place]) 12/13/2015  . Absent peripheral pulse 12/13/2015  . Wound dehiscence 10/01/2015  . Lumbar stenosis with neurogenic claudication 09/02/2015  . Spinal stenosis 08/11/2015  . 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 ([place]) 07/13/2015  . Chronic constipation 07/13/2015  . OSA (obstructive sleep apnea) 06/25/2015  . Diabetic gastroparesis ([place]) 04/23/2015  . 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/23/2015  . Acute anxiety 04/23/2015  . Hypertriglyceridemia 04/23/2015  . Statin intolerance 04/23/2015  . Asthma, mild  intermittent 02/01/2015  . Type 2 diabetes mellitus with diabetic nephropathy ([place]) 12/31/2014  . Esophagitis 12/31/2014  . Dysphagia 12/31/2014  . Acid reflux 12/29/2014  . Sinus tachycardia 12/03/2014  . Bilateral carpal tunnel syndrome 12/01/2014  . DM neuropathy, type II diabetes mellitus ([place]) 12/01/2014  . Nausea 12/01/2014  . Benign neoplasm of colon 11/30/2012  . Ischemic colitis ([place]) 11/30/2012  . Diverticulosis of colon (without mention of hemorrhage) 11/30/2012  . Bright red blood per rectum 11/28/2012    Class: Acute  . Essential hypertension, benign 11/28/2012  . Malnutrition of moderate degree ([place]) 11/28/2012    Past Surgical History:  Procedure Laterality Date  . ABDOMINAL HYSTERECTOMY    . BACK SURGERY  09/02/2015  . CERVICAL FUSION    . CESAREAN SECTION  1986  . COLONOSCOPY N/A 11/30/2012   Procedure: COLONOSCOPY;  Surgeon: Irene Shipper, MD;  Location: [facility] ENDOSCOPY;  Service: Endoscopy;  Laterality: N/A;  . COLONOSCOPY WITH PROPOFOL N/A 01/03/2016   Procedure: COLONOSCOPY WITH PROPOFOL;  Surgeon: Manya Silvas, MD;  Location[facility] END[facility]COPY;  Service: Endoscopy;  Laterality: N/A;  . ESOPHAGOGASTRODUODENOSCOPY (EGD) WITH PROPOFOL N/A 01/14/2015   Procedure: ESOPHAGOGASTRODUODENOSCOPY (EGD) WITH PROPOFOL;  Surgeon: Manya Silvas, MD;  Location: [facility] ENDOSCOPY;  Service: Endoscopy;  Laterality: N/A;  . LUMBAR WOUND DEBRIDEMENT N/A 10/01/2015   Procedure: LUMBAR WOUND DEBRIDEMENT;  Surgeon: Ashok Pall, MD;  Location: [facility] NEURO ORS;  Service: Neurosurgery;  Laterality: N/A;  LUMBAR WOUND DEBRIDEMENT  .  SAVORY DILATION N/A 01/14/2015   Procedure: SAVORY DILATION;  Surgeon: Manya Silvas, MD;  Location: [facility] ENDOSCOPY;  Service: Endoscopy;  Laterality: N/A;  . TONSILLECTOMY      OB History    No data available       Home Medications    Prior to Admission medications   Medication Sig Start Date End Date Taking? Authorizing Provider  acetaminophen  (TYLENOL) 500 MG tablet Take 1,000 mg by mouth every 6 (six) hours as needed for mild pain.  11/30/12  Yes [provider]  albuterol (PROVENTIL HFA;VENTOLIN HFA) 108 (90 Base) MCG/ACT inhaler Inhale 2 puffs into the lungs every 6 (six) hours as needed for wheezing or shortness of breath. 05/15/16  Yes Roselee Nova, MD  amoxicillin-clavulanate (AUGMENTIN) 875-125 MG tablet Take 1 tablet by mouth 2 (two) times daily. Patient taking differently: Take 1 tablet by mouth 2 (two) times daily. 4 days left 12/07/16  Yes Regal, Tamala Fothergill, DPM  aspirin 325 MG tablet Take 325 mg by mouth daily.   Yes [provider]  cyclobenzaprine (FLEXERIL) 10 MG tablet cyclobenzaprine 10 mg tablet  Take 1 tablet qhs by oral route prn spasm.   Yes [provider]  DULoxetine (CYMBALTA) 60 MG capsule Take 60 mg by mouth at bedtime.  10/31/16 01/29/17 Yes [provider]  fenofibrate (TRICOR) 145 MG tablet Take 145 mg by mouth daily. 12/05/16  Yes [provider]  gabapentin (NEURONTIN) 400 MG capsule Take 400 mg by mouth 6 (six) times daily. Reported on 12/13/2015   Yes [provider]  glimepiride (AMARYL) 2 MG tablet Take 2 mg by mouth 2 (two) times daily.  10/29/15  Yes [provider]  ibuprofen (ADVIL,MOTRIN) 200 MG tablet Take 400 mg by mouth every 6 (six) hours as needed for mild pain.   Yes [provider]  Icosapent Ethyl (VASCEPA) 1 g CAPS Take 2,000 mg by mouth 2 (two) times daily. 02/08/16  Yes Rochel Brome A, MD  Insulin Degludec 200 UNIT/ML SOPN Take 16 units subcutaneously once daily. Take at bedtime. 11/01/16  Yes [provider]  levofloxacin (LEVAQUIN) 500 MG tablet Take 1 tablet (500 mg total) by mouth daily. Patient taking differently: Take 500 mg by mouth daily. 4 days left 12/07/16  Yes Regal, Tamala Fothergill, DPM  lisinopril (PRINIVIL,ZESTRIL) 40 MG tablet Take 1 tablet (40 mg total) by mouth daily. 08/16/16  Yes Roselee Nova, MD    metFORMIN (GLUCOPHAGE) 500 MG tablet Take 2 tablets (1,000 mg total) by mouth 2 (two) times daily with a meal. 03/17/15  Yes Ashok Norris, MD  Multiple Vitamin (MULTIVITAMIN WITH MINERALS) TABS Take 1 tablet by mouth daily. Reported on 07/21/2015   Yes [provider]  tobramycin-dexamethasone [facility]) ophthalmic solution tobramycin 0.3 %-dexamethasone 0.1 % eye drops,suspension   Yes [provider]  torsemide (DEMADEX) 20 MG tablet Take 1 tablet (20 mg total) by mouth every other day. 04/23/15  Yes Ashok Norris, MD  Vitamin D, Ergocalciferol, (DRISDOL) 50000 units CAPS capsule Take 1 capsule (50,000 Units total) by mouth once a week. For 12 weeks 09/27/16  Yes Roselee Nova, MD    Family History Family History  Problem Relation Age of Onset  . Lung cancer Father   . Hypertension Father   . Arthritis Father   . Other Mother        hardening of the arteries/renal cell carcenoma  . Hypertension Mother   . Stroke  Mother   . Kidney cancer Mother   . Arthritis Brother   . Rheum arthritis Maternal Uncle   . Bladder Cancer Neg Hx     Social History Social History  Substance Use Topics  . Smoking status: Never Smoker  . Smokeless tobacco: Never Used  . Alcohol use No     Comment: wine every 6-8 months     Allergies   Eggs or egg-derived products; Atorvastatin; Flu virus vaccine; Latex; and Pravastatin   Review of Systems Review of Systems  Constitutional: Positive for fatigue and fever. Negative for chills.  HENT: Negative for ear pain and sore throat.   Eyes: Negative for pain and visual disturbance.  Respiratory: Negative for cough and shortness of breath.   Cardiovascular: Negative for chest pain and palpitations.  Gastrointestinal: Negative for abdominal pain and vomiting.  Endocrine: Negative for polyuria.  Genitourinary: Negative for dysuria and hematuria.  Musculoskeletal: Positive for gait problem. Negative for arthralgias and back pain.   Skin: Positive for wound. Negative for color change and rash.  Neurological: Negative for seizures, syncope and headaches.  Hematological: Does not bruise/bleed easily.  Psychiatric/Behavioral: Negative for agitation.  All other systems reviewed and are negative.    Physical Exam Updated Vital Signs BP (!) 103/51 (BP Location: Right Arm)   Pulse 80   Temp 99 F (37.2 C) (Oral)   Resp 18   Ht 5' (1.524 m)   Wt 95.7 kg (210 lb 14.4 oz)   SpO2 100%   BMI 41.19 kg/m   Physical Exam  Constitutional: She is oriented to person, place, and time. She appears well-developed and well-nourished.  HENT:  Head: Normocephalic and atraumatic.  Eyes: Pupils are equal, round, and reactive to light. Conjunctivae and EOM are normal.  Neck: Normal range of motion. No tracheal deviation present.  Cardiovascular: Normal rate and intact distal pulses.   Pulmonary/Chest: Effort normal. No respiratory distress.  Abdominal: Soft. She exhibits no distension.  Musculoskeletal: She exhibits no deformity.  Neurological: She is alert and oriented to person, place, and time.  Skin: Skin is warm and dry.  Circumferential punctate wound, sole of the right foot near the base of the fifth toe. Surrounding erythema and some skin sloughing.  Psychiatric: She has a normal mood and affect.     ED Treatments / Results  Labs (all labs ordered are listed, but only abnormal results are displayed) Labs Reviewed  COMPREHENSIVE METABOLIC PANEL - Abnormal; Notable for the following:       Result Value   ALT 13 (*)    All other components within normal limits  CBC - Abnormal; Notable for the following:    Hemoglobin 11.6 (*)    All other components within normal limits  URINALYSIS, ROUTINE W REFLEX MICROSCOPIC - Abnormal; Notable for the following:    Leukocytes, UA MODERATE (*)    Bacteria, UA RARE (*)    Squamous Epithelial / LPF 0-5 (*)    All other components within normal limits  GLUCOSE, CAPILLARY -  Abnormal; Notable for the following:    Glucose-Capillary 107 (*)    All other components within normal limits  I-STAT CG4 LACTIC ACID, ED - Abnormal; Notable for the following:    Lactic Acid, Venous 2.56 (*)    All other components within normal limits  CULTURE, BLOOD (ROUTINE X 2)  CULTURE, BLOOD (ROUTINE X 2)  URINE CULTURE  HIV ANTIBODY (ROUTINE TESTING)  HEMOGLOBIN A1C  I-STAT CG4 LACTIC ACID, ED  EKG  EKG Interpretation None       Radiology Dg Foot Complete Right  Result Date: 12/14/2016 CLINICAL DATA:  Nonhealing wound on the lateral aspect of the right foot at the level the fifth MTP joint. The patient is diabetic and the wound has been present for 11 months. EXAM: RIGHT FOOT COMPLETE - 3+ VIEW COMPARISON:  Plain films the right foot 02/02/2012. FINDINGS: Skin ulceration along the lateral aspect of the fifth MTP joint is identified. No underlying soft tissue gas or radiopaque foreign body. No bony destructive change or periosteal reaction. No fracture or dislocation. Midfoot degenerative change and calcaneal spurring noted. IMPRESSION: Skin ulceration adjacent to the fifth MTP joint without underlying bony abnormality. Electronically Signed   By: Inge Rise M.D.   On: 12/14/2016 15:36   Dg Foot Complete Right  Result Date: 12/14/2016 Please see detailed radiograph report in office note.   Procedures Procedures (including critical care time)  Medications Ordered in ED Medications  cyclobenzaprine (FLEXERIL) tablet 10 mg (not administered)  DULoxetine (CYMBALTA) DR capsule 60 mg (60 mg Oral Given 12/14/16 2054)  Vitamin D (Ergocalciferol) (DRISDOL) capsule 50,000 Units (not administered)  gabapentin (NEURONTIN) capsule 400 mg (400 mg Oral Given 12/14/16 2326)  multivitamin with minerals tablet 1 tablet (not administered)  Icosapent Ethyl CAPS 2,000 mg (2,000 mg Oral Not Given 12/14/16 2323)  heparin injection 5,000 Units (5,000 Units Subcutaneous Given 12/14/16  2054)  acetaminophen (TYLENOL) tablet 650 mg (not administered)    Or  acetaminophen (TYLENOL) suppository 650 mg (not administered)  insulin aspart (novoLOG) injection 0-15 Units (not administered)  aspirin EC tablet 81 mg (81 mg Oral Given 12/14/16 2054)  albuterol (PROVENTIL) (2.5 MG/3ML) 0.083% nebulizer solution 2.5 mg (not administered)  piperacillin-tazobactam (ZOSYN) IVPB 3.375 g (not administered)  sodium chloride 0.9 % bolus 1,000 mL (0 mLs Intravenous Stopped 12/14/16 1842)  vancomycin (VANCOCIN) 1,500 mg in sodium chloride 0.9 % 500 mL IVPB (0 mg Intravenous Stopped 12/14/16 1958)  piperacillin-tazobactam (ZOSYN) IVPB 3.375 g (0 g Intravenous Stopped 12/14/16 1812)     Initial Impression / Assessment and Plan / ED Course  I have reviewed the triage vital signs and the nursing notes.  Pertinent labs & imaging results that were available during my care of the patient were reviewed by me and considered in my medical decision making (see chart for details).     Olivia Romero is a 61 year old female with history of diabetes and diabetic neuropathy coming in today with foot infection. Patient states she has been treated over the last 3 weeks as an outpatient for right foot cellulitis. She has been treated with amoxicillin as well as Levaquin. Saw her podiatrist today who instructed her to come to the emergency department with concern for outpatient treatment failure of cellulitis and concern for osteomyelitis. Does report some history of fevers at home and generalized malaise.  On exam there is a wound on the sole of the foot near the base of the fifth toe. There is some surrounding erythema. Afebrile and vital signs stable. No leukocytosis however lactate is elevated. Considering failure of outpatient treatment for cellulitis, patient will need admission for IV antibiotics as well as potential for advanced imaging. Started on IV vancomycin and Zosyn here in the emergency  department.  She'll be admitted to the hospitalist service for further management and evaluation. Stable while under my care in the emergency department.  Patient was seen with my attending, Dr. Rex Kras, who voiced agreement and oversaw the  evaluation and treatment of this patient.   Dragon [employer] seismologist was used in the creation of this note. If there are any errors or inconsistencies needing clarification, please contact me directly.   Final Clinical Impressions(s) / ED Diagnoses   Final diagnoses:  Wound abscess    New Prescriptions Current Discharge Medication List       Valda Lamb, MD 12/14/16 2356    Little, Wenda Overland, MD 12/15/16 1451

## 2016-12-14 NOTE — ED Triage Notes (Signed)
Pt sts right foot wound x 9 months with increasing worseness with low grade fever

## 2016-12-14 NOTE — Progress Notes (Signed)
Pharmacy Antibiotic Note  Olivia Romero is a 61 y.o. female admitted on 12/14/2016 with diabetic foot infection who had previously been on a course of augmentin and levofloxacin  Pharmacy has been consulted for vancomycin and zosyn dosing. Patient is afebrile, with normal white count and creatinine (est CrCl 90-100). First dose of vancomycin ordered already and first dose of zosyn ordered. Patient had foot wound culture from 11/2016 growing pseudomonas and abundant GPC in pairs/clusters. No repeat cultures ordered yet and MRI pending to rule out osteo. Will treat like systemic infection (osteo) and order additional vancomycin to complete load.  Plan: Vancomycin 750mg  IV x1 Vancomycin 1750 mg IV every 12 hours.  Goal trough 15-20 mcg/mL. Zosyn 3.375g IV q8h (4 hour infusion).  Monitor renal function, c/s, LOT, ability to de-escalate F/U MRI results  Height: 5' (152.4 cm) Weight: 206 lb (93.4 kg) IBW/kg (Calculated) : 45.5  Temp (24hrs), Avg:99 F (37.2 C), Min:98.9 F (37.2 C), Max:99 F (37.2 C)   Recent Labs Lab 12/14/16 1519 12/14/16 1530  WBC 9.6  --   CREATININE 0.68  --   LATICACIDVEN  --  2.56*    Estimated Creatinine Clearance: 77.3 mL/min (by C-G formula based on SCr of 0.68 mg/dL).    Allergies  Allergen Reactions  . Eggs Or Egg-Derived Products Other (See Comments)    Angioedema  . Atorvastatin Other (See Comments)    Liver toxicity  . Flu Virus Vaccine Other (See Comments)  . Latex Itching  . Pravastatin Itching and Rash    Antimicrobials this admission: Vancomycin 7/12 >>  Zosyn 7/12 >>   Dose adjustments this admission:  Microbiology results: 6/21 Wound Cx: PsdM (Pan Sensitive)  Thank you for allowing pharmacy to be a part of this patient's care.  Dierdre Harness, PharmD Clinical Pharmacist [phone] (Pager) 12/14/2016 5:04 PM

## 2016-12-14 NOTE — ED Notes (Signed)
Unable to give report

## 2016-12-14 NOTE — ED Notes (Signed)
The pt cme in today after leaving a podiatrist office.  He has been treating a foot ulcer on the rt foot bottom for almost a year.  The pt is a diabetic and she has had an increasing swelling and draining  From the quarter size ulcer on the  Rt side of her foot and plantar surface of that foot also

## 2016-12-14 NOTE — ED Notes (Signed)
Iv team consulted   For iv access

## 2016-12-14 NOTE — Telephone Encounter (Addendum)
DR. Paulla Dolly ordered pt to ED for right lower extremity cellulitis, possible osteomyelitis and recommends MRI of the lower extremity and IV antibiotics. I informed [facility] of pt's ETA and Dr. Mellody Drown orders. 12/15/2016-I spoke with pt she states she is waiting on the MRI and has been since midnight, had IV antibiotics started yesterday, foot has bleed through the dressing put on in the hospital. I told pt Dr. Paulla Dolly had wanted to make certain she had continuum of care and felt she should request the hospital doctor make a referral to our Dr. Celesta Gentile and to call our office once the MRI had been performed. I informed Dr. Paulla Dolly and Dr. Jacqualyn Posey of pt's status.

## 2016-12-14 NOTE — ED Notes (Signed)
Pt up to br  strill has vancomycin running.  Mri  Cancelled until the pt gets to the floor.    Foot ulcer sl bleeding  Covered with steril 2by 2 gauze

## 2016-12-15 ENCOUNTER — Inpatient Hospital Stay (HOSPITAL_COMMUNITY): Payer: BLUE CROSS/BLUE SHIELD

## 2016-12-15 DIAGNOSIS — L03818 Cellulitis of other sites: Secondary | ICD-10-CM

## 2016-12-15 LAB — GLUCOSE, CAPILLARY
GLUCOSE-CAPILLARY: 125 mg/dL — AB (ref 65–99)
GLUCOSE-CAPILLARY: 101 mg/dL — AB (ref 65–99)
Glucose-Capillary: 115 mg/dL — ABNORMAL HIGH (ref 65–99)
Glucose-Capillary: 180 mg/dL — ABNORMAL HIGH (ref 65–99)

## 2016-12-15 LAB — URINE CULTURE
Culture: NO GROWTH
SPECIAL REQUESTS: NORMAL

## 2016-12-15 LAB — HIV ANTIBODY (ROUTINE TESTING W REFLEX): HIV SCREEN 4TH GENERATION: NONREACTIVE

## 2016-12-15 MED ORDER — GADOBENATE DIMEGLUMINE 529 MG/ML IV SOLN: 20.0000 mL | Freq: Once | INTRAVENOUS | Status: DC

## 2016-12-15 MED ORDER — PIPERACILLIN-TAZOBACTAM 3.375 G IVPB
3.3750 g | Freq: Three times a day (TID) | INTRAVENOUS | Status: DC
  Administered 2016-12-15 – 2016-12-16 (×3): 3.375 g via INTRAVENOUS
  Filled 2016-12-15 (×4): qty 50

## 2016-12-15 MED ORDER — PIPERACILLIN-TAZOBACTAM 3.375 G IVPB 30 MIN: 3.3750 g | Freq: Three times a day (TID) | INTRAVENOUS | Status: DC

## 2016-12-15 MED ORDER — VANCOMYCIN HCL IN DEXTROSE 1-5 GM/200ML-% IV SOLN
1000.0000 mg | Freq: Two times a day (BID) | INTRAVENOUS | Status: DC
  Administered 2016-12-15 – 2016-12-16 (×2): 1000 mg via INTRAVENOUS
  Filled 2016-12-15 (×3): qty 200

## 2016-12-15 MED ORDER — VANCOMYCIN HCL IN DEXTROSE 1-5 GM/200ML-% IV SOLN: 1000.0000 mg | INTRAVENOUS | Status: DC

## 2016-12-15 NOTE — Discharge Summary (Signed)
Name: Olivia Romero MRN: [id number] DOB: [birth year] 61 y.o. PCP: Roselee Nova, MD  Date of Admission: 12/14/2016  4:02 PM Date of Discharge: 12/16/2016 Attending Physician: Oval Linsey, MD  Discharge Diagnosis: Diabetic foot ulcer Possible Left leg stress fracture Poorly controlled diabetes GERD Diabetic polyneuropathy Familial hypertriglyceridemia MDD History of TIA in 2009 Elevated ASCVD Risk  Active Problems:   Diabetic foot ulcer ([place])   Discharge Medications: Allergies as of 12/16/2016      Reactions   Eggs Or Egg-derived Products Other (See Comments)   Angioedema   Atorvastatin Other (See Comments)   Liver toxicity   Flu Virus Vaccine Other (See Comments)   Latex Itching   Pravastatin Itching, Rash      Medication List    STOP taking these medications   amoxicillin-clavulanate 875-125 MG tablet Commonly known as:  AUGMENTIN   levofloxacin 500 MG tablet Commonly known as:  LEVAQUIN     TAKE these medications   acetaminophen 500 MG tablet Commonly known as:  TYLENOL Take 1,000 mg by mouth every 6 (six) hours as needed for mild pain.   albuterol 108 (90 Base) MCG/ACT inhaler Commonly known as:  PROVENTIL HFA;VENTOLIN HFA Inhale 2 puffs into the lungs every 6 (six) hours as needed for wheezing or shortness of breath.   aspirin 81 MG tablet Take 1 tablet (81 mg total) by mouth daily. What changed:  medication strength  how much to take   ciprofloxacin 500 MG tablet Commonly known as:  CIPRO Take 1 tablet (500 mg total) by mouth 2 (two) times daily.   cyclobenzaprine 10 MG tablet Commonly known as:  FLEXERIL cyclobenzaprine 10 mg tablet  Take 1 tablet qhs by oral route prn spasm.   DULoxetine 60 MG capsule Commonly known as:  CYMBALTA Take 60 mg by mouth at bedtime.   fenofibrate 145 MG tablet Commonly known as:  TRICOR Take 145 mg by mouth daily.   gabapentin 400 MG capsule Commonly known as:  NEURONTIN Take 400 mg by mouth  6 (six) times daily. Reported on 12/13/2015   glimepiride 2 MG tablet Commonly known as:  AMARYL Take 2 mg by mouth 2 (two) times daily.   ibuprofen 200 MG tablet Commonly known as:  ADVIL,MOTRIN Take 400 mg by mouth every 6 (six) hours as needed for mild pain.   Icosapent Ethyl 1 g Caps Commonly known as:  VASCEPA Take 2,000 mg by mouth 2 (two) times daily.   Insulin Degludec 200 UNIT/ML Sopn Take 16 units subcutaneously once daily. Take at bedtime.   lisinopril 40 MG tablet Commonly known as:  PRINIVIL,ZESTRIL Take 1 tablet (40 mg total) by mouth daily.   metFORMIN 500 MG tablet Commonly known as:  GLUCOPHAGE Take 2 tablets (1,000 mg total) by mouth 2 (two) times daily with a meal.   multivitamin with minerals Tabs tablet Take 1 tablet by mouth daily. Reported on 07/21/2015   tobramycin-dexamethasone ophthalmic solution Commonly known as:  TOBRADEX tobramycin 0.3 %-dexamethasone 0.1 % eye drops,suspension   torsemide 20 MG tablet Commonly known as:  DEMADEX Take 1 tablet (20 mg total) by mouth every other day.   Vitamin D (Ergocalciferol) 50000 units Caps capsule Commonly known as:  DRISDOL Take 1 capsule (50,000 Units total) by mouth once a week. For 12 weeks       Disposition and follow-up:   Ms.Anjelina A Vanderhoof was discharged from [facility] in Good condition.  At the hospital follow up visit please  address:  1.  Re-assess wound for persistent post-surgical changes  2.  Labs / imaging needed at time of follow-up: None  3.  Pending labs/ test needing follow-up: None  Follow-up Appointments: Follow-up Information    Edrick Kins, DPM Follow up.   Specialty:  Podiatry Why:  Please call the office on Monday morning (12/18/2016) to schedule an appointment for surgical consultation to be performed in a timely manner.  Contact information: [place] [address] [phone]        Roselee Nova, MD Follow up.    Specialty:  Family Medicine Why:  Please call and make an appointment for a follow-up visit in 1 week.  Contact information: [address] [place] [address] [phone]           Hospital Course by problem list: Active Problems:   Diabetic foot ulcer (Olivia Romero)   (1) Diabetic foot ulcer: Patient presented with a quarter-sized foot ulcer located at the lateral forefoot extending to the lateral aspect of the foot. Did not appear septic or have signs of systemic involvement. Lactate on admission mildly elevated at 2.5, repeat 1.8 after receiving IVF. No fevers or leukocytosis. ABIs normal. 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 Podiatry evaluated her inpatient and decided to perform surgical debridement as an outpatient per patient preference. She was treated with vanc and zosyn per podiatry and discharged on cipro 500 mg BID. She was instructed to follow up with her primary podiatry team after discharge for surgery.  (2) Possibile left leg stress fracture: Presented with point tenderness of left lower anteromedial shin in setting of gait disturbance from right wound. Likely a stress fracture; instructed to follow up with podiatry about recommendations for further care.   (3) Pain control: She reported a history of severe hypotension after being prescribed opioids in the past, so was maintained with flexeril and tylenol.  (4) Poorly controlled diabetes: Repeat A1c showed a value of 7.7. She had fasting blood sugars in the 115-125 range. She was discharged home on her home regimen of Tyler Aas and encouraged to follow up with her primary care physician.   (5) GERD: She received her home dose of protonix 40 mg po qd in the hospital given a long-standing history of GERD. She did not complain of new or concerning symptoms.   (6) Diabetic polyneuropathy: She was maintained on her home gabapentin 400 mg po qid.   (7) Familial hypertriglyceridemia: Lipid profiles in the past have  shown TGs > 800. Was maintained on her home medication of icosapent ethyl 2000 mg po bid.   (8) MDD: Was maintained on her home cymbalta 60 mg po qd.  (9) History of TIA in 2009: Patient was maintained on daily aspirin 81 mg. She was taking 325 mg qd on presentation which we changed due to lack of evidence indicating additional dose-dependent benefit for aspirin.   (10) Elevated ASCVD Risk: Patient had a history of being intolerant to both lipitor and crestor and so was not started on a statin during this hospitalization.  Discharge Vitals:   BP (!) 129/49 (BP Location: Right Arm)   Pulse 65   Temp 97.9 F (36.6 C) (Oral)   Resp 18   Ht 5' (1.524 m)   Wt 210 lb 14.4 oz (95.7 kg)   SpO2 100%   BMI 41.19 kg/m   Pertinent Labs, Studies, and Procedures:  Imaging Studies: (1) DG Foot Complete Right  IMPRESSION: Skin  ulceration adjacent to the fifth MTP joint without underlying bony abnormality.  Electronically Signed   By: Inge Rise M.D.   On: 12/14/2016 15:36  (2) MR FOOT RIGHT W CONTRAST   IMPRESSION: Abnormal edema and enhancement of the proximal phalanx of the little toe consistent with osteomyelitis. Adjacent soft tissue ulceration. No discrete bone destruction.  Electronically Signed   By: Lorriane Shire M.D.   On: 12/15/2016 15:00   Discharge Instructions:  START taking ciprofloxacin as intructed  STOP taking levofloxacin and amoxicillin-clavulanate  Please call Dr. [name] office on Monday morning (12/18/2016) to schedule an appointment for surgical consultation to be performed in a timely manner.   Signed: Shela Leff, MD 12/16/2016, 2:58 PM

## 2016-12-15 NOTE — Progress Notes (Signed)
Patient ID: Olivia Romero, female   DOB: March 04, 1956, 61 y.o.   MRN: [id number]  Saw pt at request of resident, Dr. Feliberto Harts, for diabetic foot ulcer w/osteo. The patient is actively managed by podiatry and from the way the patient describes it they are planning to continue to manage this issue. I have instructed the primary service to get in touch with podiatry to clarify issues of responsibility of care. If podiatry wishes orthopedic surgery to take over management of this problem then Dr. Sharol Given can see patient on Monday or as an outpatient if she is ready for discharge prior to that. At this point we are waiting clarification from primary team before doing formal consult.    Lisette Abu, PA-C Orthopedic Surgery [phone]

## 2016-12-15 NOTE — Consult Note (Addendum)
   PODIATRY CONSULTATION REASON FOR CONSULT: Diabetic ulcer RT foot  HPI: A 61 year old female with a history of diabetes mellitus and a chronic ulcer to the right foot sub-fifth MPJ presents today as an inpatient for evaluation and consultation. Patient was sent to the emergency department under the instructions of Dr. Paulla Dolly, Podiatry, for acute cellulitis with nonprogressive ulcer to the right foot. Patient presented emergency department on 12/14/2016 admitted for IV antibiotics and MRI was ordered.  CBC    Component Value Date/Time   WBC 9.6 12/14/2016 1519   RBC 4.26 12/14/2016 1519   HGB 11.6 (L) 12/14/2016 1519   HCT 36.4 12/14/2016 1519   PLT 387 12/14/2016 1519   MCV 85.4 12/14/2016 1519   MCH 27.2 12/14/2016 1519   MCHC 31.9 12/14/2016 1519   RDW 13.5 12/14/2016 1519   LYMPHSABS 3,045 11/27/2016 1439   MONOABS 735 11/27/2016 1439   EOSABS 315 11/27/2016 1439   BASOSABS 105 11/27/2016 1439      Physical Exam: General: The patient is alert and oriented x3 in no acute distress.  Dermatology: Ulcer noted to the sub-fifth MPJ right foot measuring approximately 2.51.50.3cm. There is some hyper granulation noted with a moderate amount of serosanguineous drainage. Periwound integrity is moderately cellulitic with erythema and edema.  Vascular: Palpable pedal pulses bilaterally. No evidence of lower extremity peripheral vascular disease. Capillary refill within normal limits.  Neurological: Epicritic and protective threshold diminished bilaterally.   Musculoskeletal Exam: Negative for any significant pain on palpation to the area. Patient is neuropathic. Patient has no significant pedal deformities.  MRI Impression: Abnormal edema and enhancement of the proximal phalanx of the little toe consistent with osteomyelitis. Adjacent soft tissue ulceration. No discrete bone destruction.  Assessment: 1. 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 Bone marrow edema  proximal phalanx fifth digit right foot 3. Cellulitis right foot without any evidence of abscess  Plan of Care:  1. Patient was evaluated. MRI reviewed today. 2. Patient would benefit from continued IV antibiotics while inpatient. 3. Patient requests that we manage the ulcer and undergo surgical intervention in the outpatient setting. 4. Patient okay to be discharged from a podiatry standpoint. Patient will contact the office Monday morning to schedule an appointment for surgical consultation to be performed in a timely manner. Surgery will likely consist of debridement with bone biopsy proximal phalanx. 5. Upon discharge recommend Ciprofloxacin 500mg  BID #20 based on most recent Culture and Sensativity.  6. Order placed for continuous IV antibiotics until discharge: 3.375mg  Zosyn Q8H and Vancomycin 1gram 24H.   Thank you for the consultation. Please contact me directly with any questions or concerns. [phone].   Edrick Kins, DPM [facility]  Dr. Edrick Kins, DPM    [address], [place] [address]                Office [phone]  Fax [phone]

## 2016-12-15 NOTE — Progress Notes (Addendum)
Pharmacy Antibiotic Note  Olivia Romero is a 61 y.o. female admitted on 12/14/2016 with diabetic foot infection who had previously been on a course of augmentin and levofloxacin    Pharmacy has been reconsulted for vancomycin.    Plan: Vancomycin 1000 mg IV every 12 hours.  Goal trough 15-20 mcg/mL. Zosyn 3.375g IV q8h (4 hour infusion).  Monitor renal function, c/s, LOT, ability to de-escalate F/U MRI results  Height: 5' (152.4 cm) Weight: 210 lb 14.4 oz (95.7 kg) IBW/kg (Calculated) : 45.5  Temp (24hrs), Avg:98.4 F (36.9 C), Min:98.3 F (36.8 C), Max:98.4 F (36.9 C)   Recent Labs Lab 12/14/16 1519 12/14/16 1530 12/14/16 1948  WBC 9.6  --   --   CREATININE 0.68  --   --   LATICACIDVEN  --  2.56* 1.89    Estimated Creatinine Clearance: 78.4 mL/min (by C-G formula based on SCr of 0.68 mg/dL).    Allergies  Allergen Reactions  . Eggs Or Egg-Derived Products Other (See Comments)    Angioedema  . Atorvastatin Other (See Comments)    Liver toxicity  . Flu Virus Vaccine Other (See Comments)  . Latex Itching  . Pravastatin Itching and Rash    Antimicrobials this admission: Vancomycin 7/12 >>  Zosyn 7/12 >>   Dose adjustments this admission:  Microbiology results: 6/21 Wound Cx: PsdM (Pan Sensitive)  Thank you for allowing pharmacy to be a part of this patient's care.  Erin Hearing PharmD., BCPS Clinical Pharmacist Pager [phone] 12/15/2016 8:33 PM

## 2016-12-15 NOTE — Progress Notes (Signed)
   Subjective: No acute events overnight. She is comfortable and not complaining of any lower extremity pain. Discussed plan with the patient and she agrees.   Objective:  Vital signs in last 24 hours: Vitals:   12/14/16 1830 12/14/16 1900 12/14/16 2015 12/15/16 0458  BP: (!) 89/48 (!) 105/53 (!) 103/51 109/70  Pulse: 88 89 80 84  Resp:   18 17  Temp:   99 F (37.2 C) 98.3 F (36.8 C)  TempSrc:   Oral Oral  SpO2: 98% 97% 100% 100%  Weight:   95.7 kg (210 lb 14.4 oz)   Height:   5' (1.524 m)    Physical Exam  Constitutional: She is oriented to person, place, and time. She appears well-developed and well-nourished. No distress.  HENT:  Head: Normocephalic and atraumatic.  Cardiovascular: Normal rate, regular rhythm and normal heart sounds.   No murmur heard. Pulmonary/Chest: Effort normal and breath sounds normal.  Abdominal: Soft. Bowel sounds are normal. She exhibits no distension. There is no tenderness.  Neurological: She is alert and oriented to person, place, and time.  Skin:  Wound on R foot  - appearance unchanged from yesterday.   Assessment/Plan:  Active Problems:   Diabetic foot ulcer ([place])  (1) Diabetic foot ulcer: Likely skin/soft tissue only without osteo per radiographs. MRI today. Likely Pseudomonas per prior wound cx. No fever, leukocytosis, or tachycardia. U/A benign, bcx no growth at 24 hours. Received a dose of vancomycin and a few doses of zosyn on admission, holding for now until surgical debridement and wound cx. Patient without any signs or symptoms of systemic infection. Will consult surgery after MRI.   - NPO today - Surgery consultation after MRI - F/u MRI right foot w/ contrast  - F/u A1c  - Trend bcx x 2, NG at 24 hrs - Wound care    (2) Left leg pain: Stress fracture vs medial tibial stress syndrome. Likely stress fracture given point tenderness. Will encourage rest and consult PT after surgery for recommendations.  - Pain control with  flexeril and tylenol as below - PT consultation after surgery, appreciate recs   (3) Pain control: Patient has history of significant hypotension after opioids. Uses flexeril for pain at home.  - Flexeril 10 mg PO HS PRN  - Tylenol 650 mg PO q6h PRN  (4) Poorly controlled diabetes: Last A1c 9.8 in March 2018. Uses Tresiba at home, will use SSI here. FBS around 115-125 while NPO today. - F/u A1c as above - SSI-M  GERD: Will continue home protonix 40 mg QD.  Diabetic polyneuropathy: Will continue home gabapentin 400 mg PO QID. Familial hypertriglyceridemia: Will continue home icosapent ethyl 2000 mg PO BID. MDD: Will continue home cymbalta 60 mg PO QD.  History of TIA in 2009: Will change her home ASA 325 mg to 81 mg. ASCVD risk: Allergic reactions (rash) to both lipitor and crestor documented in the past. Will hold off on starting these in hospital until further clarification regarding reactions is obtained.  Dispo: Anticipated discharge in approximately 1-2 day(s).   Lethea Killings, Medical Student 12/15/2016, 1:07 PM Pager: [phone]  Attestation for Student Documentation:  I personally was present and performed or re-performed the history, physical exam and medical decision-making activities of this service and have verified that the service and findings are accurately documented in the student's note.  Shela Leff, MD 12/15/2016, 2:55 PM

## 2016-12-15 NOTE — Progress Notes (Signed)
Subjective:    Patient ID: Olivia Romero, female   DOB: 61 y.o.   MRN: [id number]   HPI patient states that she was feeling really good and then over the last day she started to not feel good again and while she is does not think she's had a temperature she just feels achy    ROS      Objective:  Physical Exam neurovascular status unchanged with continued reduction of size of ulceration of the right fifth metatarsal. It measures today approximately 1 cm x 1 cm with no subcutaneous exposure and when probed no indication of abscess. There is mild swelling in the forefoot but the patient did not have erythema of a proximal nature currently and there was minimal calor noted upon palpation     Assessment:   Locally appears to be gradually healing from ulceration of the right fifth metatarsal but I'm concerned about continued systemic indications that there may be infection present      Plan:    H&P and x-ray taken of patient. At this point I am sending her over to the hospital and I let the hospitalist know that she needs to be admitted for probable IV antibiotics and MRI. I discussed this case with Dr. Jacqualyn Posey as I will be going out of town and pending results of MRI this patient is most likely require bone resection with biopsy. Hopefully she will respond to short-term IV antibiotics and this can be done on an outpatient basis but this will be decided pending the results of MRI and response to antibiotics. Patient is sent straight to the emergency room at this time and we will wait for the results of testing  X-rays taken today negative for signs of ostial lysis of the fifth metatarsal head right compared to serial previous x-rays

## 2016-12-16 ENCOUNTER — Inpatient Hospital Stay (HOSPITAL_COMMUNITY): Payer: BLUE CROSS/BLUE SHIELD

## 2016-12-16 DIAGNOSIS — E11621 Type 2 diabetes mellitus with foot ulcer: Secondary | ICD-10-CM

## 2016-12-16 LAB — SEDIMENTATION RATE: SED RATE: 40 mm/h — AB (ref 0–22)

## 2016-12-16 LAB — HEMOGLOBIN A1C
HEMOGLOBIN A1C: 7.7 % — AB (ref 4.8–5.6)
Mean Plasma Glucose: 174 mg/dL

## 2016-12-16 LAB — GLUCOSE, CAPILLARY
GLUCOSE-CAPILLARY: 157 mg/dL — AB (ref 65–99)
Glucose-Capillary: 196 mg/dL — ABNORMAL HIGH (ref 65–99)

## 2016-12-16 MED ORDER — CIPROFLOXACIN HCL 500 MG PO TABS: 500.0000 mg | ORAL_TABLET | Freq: Two times a day (BID) | ORAL | 0 refills | Status: AC

## 2016-12-16 MED ORDER — ASPIRIN 81 MG PO TABS: 81.0000 mg | ORAL_TABLET | Freq: Every day | ORAL | 0 refills | Status: DC

## 2016-12-16 NOTE — Progress Notes (Signed)
   Subjective: No acute events overnight. Patient seen by podiatrist overnight. Agrees and understands the plan.   Objective:  Vital signs in last 24 hours: Vitals:   12/15/16 0458 12/15/16 1700 12/15/16 2152 12/16/16 0531  BP: 109/70 (!) 133/59 (!) 117/49 126/67  Pulse: 84 77 74 66  Resp: _0 Temp: 98.3 F (36.8 C) 98.4 F (36.9 C) 98.5 F (36.9 C) 98.2 F (36.8 C)  TempSrc: Oral Oral Oral Oral  SpO2: 100% 99% 98% 98%  Weight:      Height:       Physical Exam  Constitutional: She appears well-developed and well-nourished. No distress.  Cardiovascular: Normal rate and regular rhythm.   Pulmonary/Chest: Effort normal and breath sounds normal. No respiratory distress. She has no wheezes.  Abdominal: Soft. Bowel sounds are normal. She exhibits no distension. There is no tenderness.  Neurological: She is alert.  Skin:  R foot under wrappings. Peripheral nonpitting edema.   Assessment/Plan:  Active Problems:   Diabetic foot ulcer ([place])  (1) Diabetic foot ulcer with osteomyelitis: R 5th toe osteo on MRI. Primary surgical team is her podiatrist she follows with, they discussed her case overnight and they decided to follow-up with her as an outpatient on 7/16. Nontoxic appearing, negative U/A, bcx no growth at 24 hours. Per podiatry will continue vanc/zosyn and d/c today with cipro.  - Continue Vanc/Zosyn -> d/c with cipro per podiatry - F/u ABIs today - F/u ESR   (2) Left leg pain:Likely stress fracture given point tenderness. Encouraged patient to follow up with podiatry recommendations as PT was not able to see her yesterday. - Pain control with flexeril and tylenol as below  (3) Pain control: Patient has history of significant hypotension after opioids. Uses flexeril for pain at home.  - Flexeril 10 mg PO HS PRN  - Tylenol 650 mg PO q6h PRN  (4) Diabetes mellitus:A1c today is 7.7, last A1c 9.8 in March 2018. Uses Tresiba at home. Has not needed insulin while  in the hospital. - Encourage follow-up with her PCP.  GERD:Will continue home protonix 40 mg QD.  Diabetic polyneuropathy:Will continue home gabapentin 400 mg PO QID. Familial hypertriglyceridemia:Will continue home icosapent ethyl 2000 mg PO BID. MDD: Will continue home cymbalta 60 mg PO QD.  History of TIA in 2009:Will change her home ASA 325 mg to 81 mg. ASCVD risk: Allergic reactions (rash) to both lipitor and crestor documented in the past. Will hold off on starting these in hospital.  Dispo: Anticipated discharge today.  Lethea Killings, Medical Student 12/16/2016, 10:05 AM Pager: [phone]  Attestation for Student Documentation:  I personally was present and performed or re-performed the history, physical exam and medical decision-making activities of this service and have verified that the service and findings are accurately documented in the student's note.  Shela Leff, MD 12/16/2016, 11:34 AM

## 2016-12-16 NOTE — Progress Notes (Signed)
Pt ready for DC to home.  Rx sent electronically and pt has already had family to pick up.  DC instructions given and explained.

## 2016-12-16 NOTE — Discharge Instructions (Signed)
START taking ciprofloxacin as intructed  STOP taking levofloxacin and amoxicillin-clavulanate  Please call Dr. [name] office on Monday morning (12/18/2016) to schedule an appointment for surgical consultation to be performed in a timely manner.

## 2016-12-16 NOTE — Progress Notes (Signed)
VASCULAR LAB PRELIMINARY  ARTERIAL  ABI completed:ABIs indicate normal arterial blood flow to the bilateral lower extremities.  Bilateral great toe pressures are within normal limits.     RIGHT    LEFT    PRESSURE WAVEFORM  PRESSURE WAVEFORM  BRACHIAL 138 T BRACHIAL 128 T  DP   DP    AT 155 T AT 152 T  PT 155 T PT 165 T  PER   PER    GREAT TOE 138 NA GREAT TOE 141 NA    RIGHT LEFT  ABI 1.1 1.2     Olivia Romero, RVT 12/16/2016, 1:30 PM

## 2016-12-18 ENCOUNTER — Encounter: Payer: Self-pay | Admitting: Podiatry

## 2016-12-18 ENCOUNTER — Ambulatory Visit (INDEPENDENT_AMBULATORY_CARE_PROVIDER_SITE_OTHER): Payer: BLUE CROSS/BLUE SHIELD | Admitting: Podiatry

## 2016-12-18 VITALS — BP 92/60 | HR 86 | Temp 98.4°F | Resp 18

## 2016-12-18 DIAGNOSIS — L97311 Non-pressure chronic ulcer of right ankle limited to breakdown of skin: Secondary | ICD-10-CM

## 2016-12-18 DIAGNOSIS — M86171 Other acute osteomyelitis, right ankle and foot: Secondary | ICD-10-CM | POA: Diagnosis not present

## 2016-12-18 MED ORDER — PROMETHAZINE HCL 25 MG PO TABS: 25.0000 mg | ORAL_TABLET | Freq: Three times a day (TID) | ORAL | 0 refills | Status: DC | PRN

## 2016-12-18 MED ORDER — HYDROCODONE-ACETAMINOPHEN 5-325 MG PO TABS: 1.0000 | ORAL_TABLET | Freq: Four times a day (QID) | ORAL | 0 refills | Status: DC | PRN

## 2016-12-18 MED ORDER — CLINDAMYCIN HCL 300 MG PO CAPS: 300.0000 mg | ORAL_CAPSULE | Freq: Three times a day (TID) | ORAL | 2 refills | Status: DC

## 2016-12-18 NOTE — Telephone Encounter (Signed)
Yes, please have her come in today

## 2016-12-18 NOTE — Patient Instructions (Signed)

## 2016-12-18 NOTE — Telephone Encounter (Signed)
Pt called back wanting to know if she needed to come in today. I scheduled pt to come in at the end of today but told her to come in at 4:00 pm. She is aware she is being worked in and will have to wait.

## 2016-12-18 NOTE — Telephone Encounter (Signed)
Pt was in the hospital and was told to call first thing Monday morning to speak to Dr. Jacqualyn Posey. Pt stated she did have the MRI done.

## 2016-12-19 ENCOUNTER — Telehealth: Payer: Self-pay | Admitting: Podiatry

## 2016-12-19 LAB — CULTURE, BLOOD (ROUTINE X 2)
Culture: NO GROWTH
Culture: NO GROWTH
SPECIAL REQUESTS: ADEQUATE
SPECIAL REQUESTS: ADEQUATE

## 2016-12-19 NOTE — Telephone Encounter (Signed)
I'm scheduled for surgery tomorrow. I know I am supposed to clean it good tonight but I was wondering if I should put a bandage over the area because I know it will leak some. If you could, please call me back. Thank you.

## 2016-12-19 NOTE — Telephone Encounter (Signed)
That is fine. Clean tonight then bandage with betadine and gauze.

## 2016-12-19 NOTE — Telephone Encounter (Signed)
I told pt to cleanse foot as instructed for surgery and the dress as usual. Pt states she dresses foot with betadine and gauze.

## 2016-12-19 NOTE — Progress Notes (Signed)
Subjective: Ms. Platas presents the office today for post hospital follow-up. She was admitted last week for IV antibiotics due to cellulitis and a chronic wound to her right foot. She states that she was discharged home with ciprofloxacin that she's been taking. States she has noticed a small clear blister forming the top of her foot she points over the dorsal aspect of the fifth MTPJ. She has had a wound to her right foot for about 1 year. She states that it'll fluctuate in size and severity. She had an MRI will in he hospital. She has been immobilized in a surgical shoe with heel and had attempted numerous local wound care conservative treatments without any significant improvement. Denies any systemic complaints such as fevers, chills, nausea, vomiting. No acute changes since last appointment, and no other complaints at this time.   Objective: AAO x3, NAD DP/PT pulses palpable bilaterally, CRT less than 3 seconds Ulceration continues on the plantar to plantar lateral aspect of the right fifth MTPJ with a granular wound base which appears to be hyper granular. There is localized edema and some faint erythema around the wound but there is no ascending saline disc. A clear serous filled blisters present the dorsal aspect of the fifth MTPJ. There is no areas of fluctuance or crepitus otherwise. There is no other open lesion identified to the right foot. No open lesions or pre-ulcerative lesions.  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  Plan: -All treatment options discussed with the patient including all alternatives, risks, complications.  -The patient presented in the office today for post hospital follow-up at her friend, he did accompany her. We had a long discussion regards to treatment options with the wound. At this point since the wound is been ongoing for about 1 year and has not  improved significantly I recommend surgical intervention. The patient states that she is adamant that she does not want to have the toe amputated at this point. This puts [place] and a difficult position and retracted states the toe. However we will start with this if metatarsal head excision, bone biopsy of the digit as well as wound debridement. I discussed with her and stressed her that this is not a guarantee of resolution of symptoms and that there are risks and consequences of both conservative treatment as well as surgical intervention. There is a chance that if there is infection in the toe or any reinfection and remains that she could have spreading of the infection that she may need to have another surgery and ultimate she still may have her toe amputation which she understands that she is willing to try to save the toe possible. -We will plan on right fifth metatarsal excision, wound debridement, bone biopsy of the digit on Wednesday of this week. -The incision placement as well as the postoperative course was discussed with the patient. I discussed risks of the surgery which include, but not limited to, infection, bleeding, pain, swelling, need for further surgery, delayed or nonhealing, painful or ugly scar, numbness or sensation changes, over/under correction, recurrence, transfer lesions, further deformity, hardware failure, DVT/PE, loss of toe/foot. Patient understands these risks and wishes to proceed with surgery. The surgical consent was reviewed with the patient all 3 pages were signed. No promises or guarantees were given to the outcome of the procedure. All questions were answered to the best of my ability. Before the surgery the patient  was encouraged to call the office if there is any further questions. The surgery will be performed at the [facility] on an outpatient basis. -Patient encouraged to call the office with any questions, concerns, change in symptoms.   Celesta Gentile, DPM

## 2016-12-20 DIAGNOSIS — M21541 Acquired clubfoot, right foot: Secondary | ICD-10-CM | POA: Diagnosis not present

## 2016-12-20 DIAGNOSIS — L03119 Cellulitis of unspecified part of limb: Secondary | ICD-10-CM | POA: Diagnosis not present

## 2016-12-20 DIAGNOSIS — M86171 Other acute osteomyelitis, right ankle and foot: Secondary | ICD-10-CM | POA: Diagnosis not present

## 2016-12-22 ENCOUNTER — Encounter: Payer: Self-pay | Admitting: Podiatry

## 2016-12-22 ENCOUNTER — Telehealth: Payer: Self-pay | Admitting: *Deleted

## 2016-12-22 ENCOUNTER — Ambulatory Visit (INDEPENDENT_AMBULATORY_CARE_PROVIDER_SITE_OTHER): Payer: Self-pay | Admitting: Podiatry

## 2016-12-22 ENCOUNTER — Ambulatory Visit (INDEPENDENT_AMBULATORY_CARE_PROVIDER_SITE_OTHER): Payer: BLUE CROSS/BLUE SHIELD

## 2016-12-22 VITALS — BP 108/63 | HR 90 | Temp 98.8°F | Resp 18

## 2016-12-22 DIAGNOSIS — L03119 Cellulitis of unspecified part of limb: Secondary | ICD-10-CM

## 2016-12-22 DIAGNOSIS — L02619 Cutaneous abscess of unspecified foot: Secondary | ICD-10-CM

## 2016-12-22 DIAGNOSIS — M86171 Other acute osteomyelitis, right ankle and foot: Secondary | ICD-10-CM

## 2016-12-22 NOTE — Telephone Encounter (Addendum)
Faxed required form, clinicals and demographics to [facility] with note stating orders are in the Assessment and Plan.12/25/2016-Pt states she changed her dressing yesterday and there was black in the ulcer on the bottom of her foot and it doesn't look right. I informed pt of Dr. Leigh Aurora statement and transferred to scheduler for an appt tomorrow.

## 2016-12-22 NOTE — Progress Notes (Signed)
Subjective: Olivia Romero is a 61 y.o. is seen today in office s/p right 5th metatarsal head excision, wound debridement and toe bone biopsy preformed on 12/20/2016. They state their pain is controlled and she has not had to have any pain medication. She is remaining the surgical boot. She is continuing antibiotics. Denies any systemic complaints such as fevers, chills, nausea, vomiting. No calf pain, chest pain, shortness of breath.   Objective: General: No acute distress, AAOx3  DP/PT pulses palpable 2/4, CRT < 3 sec to all digits.  Protective sensation intact. Motor function intact.  Right foot: Incision is well coapted without any evidence of dehiscence and sutures are intact. Wound submetatarsal 5 as well as daily wound base. No surrounding  erythema, ascending cellulitis, fluctuance, crepitus, malodor, drainage/purulence. There is mild edema around the surgical site. There is no pain along the surgical site.  No other areas of tenderness to bilateral lower extremities.  No other open lesions or pre-ulcerative lesions.  No pain with calf compression, swelling, warmth, erythema.   Assessment and Plan:  Status post right foot surgery, doing well with no complications   -Treatment options discussed including all alternatives, risks, and complications -X-rays were obtained and reviewed. Status post fifth metatarsal head excision. No evidence of acute as well as the midfoot. No evidence of acute fracture. -At this time recommended every other day dressing changes. Saline wet-to-dry for the wound as well as Betadine along the incision is macerated otherwise antibiotic ointment along the incision. Cover with gauze. Will try go get home health.  -Finish course of antibiotics; awaiting cultures/pathology from surgery  -Elevation -Pain medication as needed. She has not been requiring this. -Follow-up in 1 weekor sooner if any problems arise. In the meantime, encouraged to call the office with any  questions, concerns, change in symptoms.   Celesta Gentile, DPM

## 2016-12-22 NOTE — Telephone Encounter (Signed)
-----   Message from Trula Slade, DPM sent at 12/22/2016  1:03 PM EDT ----- Can you please try to set up home health for her? The note is complete with instructions.

## 2016-12-25 ENCOUNTER — Ambulatory Visit: Payer: BLUE CROSS/BLUE SHIELD | Admitting: Podiatry

## 2016-12-25 NOTE — Telephone Encounter (Signed)
It is probably a scab. Please have her come in to evaluate it. She can come to [facility] on Tuesday if she cannot get here today or please have her see one of the doctors in [facility] Tuesday.

## 2016-12-26 ENCOUNTER — Encounter: Payer: Self-pay | Admitting: Podiatry

## 2016-12-26 ENCOUNTER — Ambulatory Visit (INDEPENDENT_AMBULATORY_CARE_PROVIDER_SITE_OTHER): Payer: BLUE CROSS/BLUE SHIELD | Admitting: Podiatry

## 2016-12-26 VITALS — BP 112/74 | HR 74 | Temp 97.8°F | Resp 18

## 2016-12-26 DIAGNOSIS — L97311 Non-pressure chronic ulcer of right ankle limited to breakdown of skin: Secondary | ICD-10-CM

## 2016-12-26 DIAGNOSIS — M86171 Other acute osteomyelitis, right ankle and foot: Secondary | ICD-10-CM

## 2016-12-26 NOTE — Progress Notes (Signed)
Subjective: Olivia Romero is a 61 y.o. is seen today in office s/p right 5th metatarsal head excision, wound debridement and toe bone biopsy preformed on 12/20/2016. She presents today as an add-on to the schedule for concerns of a dark area that is formed around the wound. She took some pictures that she showed me which did reveal a small area of black tissue around the wound. She denies any increase in drainage or any increase in swelling or redness to her foot. She notices when she was doing the dressing change. Denies any recent injury. She has no other concerns today. Denies any systemic complaints such as fevers, chills, nausea, vomiting. No calf pain, chest pain, shortness of breath.   Objective: General: No acute distress, AAOx3  DP/PT pulses palpable 2/4, CRT < 3 sec to all digits.  Protective sensation intact. Motor function intact.  Right foot: Incision is well coapted without any evidence of dehiscence and sutures are intact. Wound on the plantar lateral aspect of the right foot submetatarsal 5. Just adjacent this areas, macerated tissue with evidence of dried blood around it. Upon debridement was able to remove some of the dried blood. There is no evidence of any other darkened material present. The actual wound appears to be healing well and is more superficial today has a granular wound base as well. It measures 1.6 x 1.2 x 0.1 cm. Patient has swelling erythema, ascending synovitis. There is no fluctuance or crepitus. There is no malodor. Small superficial area of skin breakdown to the dorsal aspect of the foot as well from the area previous blister.  No other open lesions or pre-ulcerative lesions.  No pain with calf compression, swelling, warmth, erythema.   Assessment and Plan:  Status post right foot surgery, doing well with no complications   -Treatment options discussed including all alternatives, risks, and complications -I sharply debrided they area of macerated tissue as  there is some peeling of the skin. This was some dried blood that she had noticed. The actual wound appears to be healing well. I ordered a calcium alginate with silver dressing. I ordered this today through [facility]. Recommended homecare to perform these dressing changes as well as 3 times a week. -Awaiting home health care set up -Continue antibiotics; awaiting cultures/pathology -Continue surgical shoe. -Monitor for any clinical signs or symptoms of infection and directed to call the office immediately should any occur or go to the ER. -RTC as scheduled on Friday or sooner if needed.   Celesta Gentile, DPM

## 2016-12-27 NOTE — Telephone Encounter (Addendum)
-----   Message from Trula Slade, DPM sent at 12/26/2016 11:06 AM EDT ----- Can you please change the wound care orders? Will change to calcium alginate w/ silver to the wound followed by a dry sterile dressing. Please do 3 times a week. Thanks. 12/25/2016-Faxed copy of Dr. Leigh Aurora 12/25/2016 3:43pm orders to [facility].12/27/2016-Dr. Jacqualyn Posey ordered Calcium Alginate with Silver for right foot sub 5th ulcer L97.311 measuring 1.6 x 1.2 x 0.1cm with moderate exudate, faxed to [facility].12/28/2016-Cynthia - [facility] states pt's 89/61, and she has had some light-headedness. I instructed Caren Griffins and pt to contact Dr. Rochel Brome with the BP. I called the [phone] for Dr. Manuella Ghazi and it was no longer in service. I spoke with pt and she states she just spoke with Dr. Manuella Ghazi office and they wanted to see her today. I told pt that was very good and I would inform Dr. Jacqualyn Posey of pt's current situation.

## 2016-12-28 ENCOUNTER — Telehealth: Payer: Self-pay | Admitting: Podiatry

## 2016-12-28 ENCOUNTER — Encounter: Payer: Self-pay | Admitting: Family Medicine

## 2016-12-28 ENCOUNTER — Ambulatory Visit (INDEPENDENT_AMBULATORY_CARE_PROVIDER_SITE_OTHER): Payer: BLUE CROSS/BLUE SHIELD | Admitting: Family Medicine

## 2016-12-28 VITALS — BP 100/60 | HR 97 | Temp 97.9°F | Resp 17 | Ht 60.0 in | Wt 206.0 lb

## 2016-12-28 DIAGNOSIS — M869 Osteomyelitis, unspecified: Secondary | ICD-10-CM | POA: Diagnosis not present

## 2016-12-28 DIAGNOSIS — I10 Essential (primary) hypertension: Secondary | ICD-10-CM

## 2016-12-28 DIAGNOSIS — E1142 Type 2 diabetes mellitus with diabetic polyneuropathy: Secondary | ICD-10-CM

## 2016-12-28 MED ORDER — LISINOPRIL 20 MG PO TABS: 20.0000 mg | ORAL_TABLET | Freq: Every day | ORAL | 0 refills | Status: DC

## 2016-12-28 NOTE — Progress Notes (Signed)
Name: Olivia Romero   MRN: [id number]    DOB: 06/01/1956   Date:12/28/2016       Progress Note  Subjective  Chief Complaint  Chief Complaint  Patient presents with  . Follow-up    BP    HPI  Hypertension: Patient presents for follow-up of hypertension, blood pressure was reportedly low during the nursing visit at her home today, it was reportedly 89/61 mmHg, patient was asymptomatic at that time, however she was asked to follow-up for evaluation and to rule out other etiologies. She continues to take Lisinopril 40 mg daily.  Past Medical History:  Diagnosis Date  . Anemia   . Arthritis   . Asthma   . Carpal tunnel syndrome on right   . Colon polyps    adenomatous  . Diabetes mellitus without complication ([place])   . Diverticulosis of colon   . Esophagitis   . GERD (gastroesophageal reflux disease)   . Hemorrhoid    internal  . Hyperlipemia   . Hypertension   . Neuropathy   . Pneumonia   . PONV (postoperative nausea and vomiting)   . Sleep apnea    has had in the past lost 50 pounds and do longer uses cpap  . Stroke-like episode ([place]) 2009   TIA    Past Surgical History:  Procedure Laterality Date  . ABDOMINAL HYSTERECTOMY    . BACK SURGERY  09/02/2015  . CERVICAL FUSION    . CESAREAN SECTION  1986  . COLONOSCOPY N/A 11/30/2012   Procedure: COLONOSCOPY;  Surgeon: Irene Shipper, MD;  Location: [facility] ENDOSCOPY;  Service: Endoscopy;  Laterality: N/A;  . COLONOSCOPY WITH PROPOFOL N/A 01/03/2016   Procedure: COLONOSCOPY WITH PROPOFOL;  Surgeon: Manya Silvas, MD;  Location: [facility] ENDOSCOPY;  Service: Endoscopy;  Laterality: N/A;  . ESOPHAGOGASTRODUODENOSCOPY (EGD) WITH PROPOFOL N/A 01/14/2015   Procedure: ESOPHAGOGASTRODUODENOSCOPY (EGD) WITH PROPOFOL;  Surgeon: Manya Silvas, MD;  Location: [facility] ENDOSCOPY;  Service: Endoscopy;  Laterality: N/A;  . LUMBAR WOUND DEBRIDEMENT N/A 10/01/2015   Procedure: LUMBAR WOUND DEBRIDEMENT;  Surgeon: Ashok Pall, MD;  Location: [place]  NEURO ORS;  Service: Neurosurgery;  Laterality: N/A;  LUMBAR WOUND DEBRIDEMENT  . SAVORY DILATION N/A 01/14/2015   Procedure: SAVORY DILATION;  Surgeon: Manya Silvas, MD;  Location: [facility] ENDOSCOPY;  Service: Endoscopy;  Laterality: N/A;  . TONSILLECTOMY      Family History  Problem Relation Age of Onset  . Lung cancer Father   . Hypertension Father   . Arthritis Father   . Other Mother        hardening of the arteries/renal cell carcenoma  . Hypertension Mother   . Stroke Mother   . Kidney cancer Mother   . Arthritis Brother   . Rheum arthritis Maternal Uncle   . Bladder Cancer Neg Hx     Social History   Social History  . Marital status: Single    Spouse name: N/A  . Number of children: 1  . 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 Smoking status: Never Smoker  . Smokeless tobacco: Never Used  . Alcohol use No     Comment: wine every 6-8 months  . Drug use: No  . Sexual activity: No   Other Topics Concern  . Not on file   Social History Narrative  . No narrative on file     Current Outpatient Prescriptions:  .  acetaminophen (TYLENOL) 500 MG tablet, Take 1,000 mg by mouth every 6 (six) hours as needed for mild pain. , Disp: , Rfl:  .  albuterol (PROVENTIL HFA;VENTOLIN HFA) 108 (90 Base) MCG/ACT inhaler, Inhale 2 puffs into the lungs every 6 (six) hours as needed for wheezing or shortness of breath., Disp: 1 Inhaler, Rfl: 3 .  aspirin 81 MG tablet, Take 1 tablet (81 mg total) by mouth daily., Disp: 30 tablet, Rfl: 0 .  clindamycin (CLEOCIN) 300 MG capsule, Take 1 capsule (300 mg total) by mouth 3 (three) times daily., Disp: 30 capsule, Rfl: 2 .  cyclobenzaprine (FLEXERIL) 10 MG tablet, cyclobenzaprine 10 mg tablet  Take 1 tablet qhs by oral route prn spasm., Disp: , Rfl:  .  DULoxetine (CYMBALTA) 60 MG capsule, Take 60 mg by mouth at bedtime. , Disp: , Rfl:  .  fenofibrate (TRICOR) 145 MG  tablet, Take 145 mg by mouth daily., Disp: , Rfl: 10 .  gabapentin (NEURONTIN) 400 MG capsule, Take 400 mg by mouth 6 (six) times daily. Reported on 12/13/2015, Disp: , Rfl:  .  glimepiride (AMARYL) 2 MG tablet, Take 2 mg by mouth 2 (two) times daily. , Disp: , Rfl:  .  HYDROcodone-acetaminophen (NORCO/VICODIN) 5-325 MG tablet, Take 1 tablet by mouth every 6 (six) hours as needed., Disp: 20 tablet, Rfl: 0 .  ibuprofen (ADVIL,MOTRIN) 200 MG tablet, Take 400 mg by mouth every 6 (six) hours as needed for mild pain., Disp: , Rfl:  .  Icosapent Ethyl (VASCEPA) 1 g CAPS, Take 2,000 mg by mouth 2 (two) times daily., Disp: 360 capsule, Rfl: 1 .  Insulin Degludec 200 UNIT/ML SOPN, Take 16 units subcutaneously once daily. Take at bedtime., Disp: , Rfl:  .  lisinopril (PRINIVIL,ZESTRIL) 40 MG tablet, Take 1 tablet (40 mg total) by mouth daily., Disp: 90 tablet, Rfl: 1 .  metFORMIN (GLUCOPHAGE) 500 MG tablet, Take 2 tablets (1,000 mg total) by mouth 2 (two) times daily with a meal., Disp: 120 tablet, Rfl: 3 .  Multiple Vitamin (MULTIVITAMIN WITH MINERALS) TABS, Take 1 tablet by mouth daily. Reported on 07/21/2015, Disp: , Rfl:  .  promethazine (PHENERGAN) 25 MG tablet, Take 1 tablet (25 mg total) by mouth every 8 (eight) hours as needed for nausea or vomiting., Disp: 20 tablet, Rfl: 0 .  tobramycin-dexamethasone (TOBRADEX) ophthalmic solution, tobramycin 0.3 %-dexamethasone 0.1 % eye drops,suspension, Disp: , Rfl:  .  torsemide (DEMADEX) 20 MG tablet, Take 1 tablet (20 mg total) by mouth every other day., Disp: 15 tablet, Rfl: 2 .  Vitamin D, Ergocalciferol, (DRISDOL) 50000 units CAPS capsule, Take 1 capsule (50,000 Units total) by mouth once a week. For 12 weeks, Disp: 12 capsule, Rfl: 0  Allergies  Allergen Reactions  . Eggs Or Egg-Derived Products Other (See Comments)    Angioedema  . Atorvastatin Other (See Comments)    Liver toxicity  . Flu Virus Vaccine Other (See Comments)  . Latex Itching  .  Pravastatin Itching and Rash     ROS   Objective  Vitals:   12/28/16 1525  BP: 100/60  Pulse: 97  Resp: 17  Temp: 97.9 F (36.6 C)  TempSrc: Oral  SpO2: 96%  Weight: 206 lb (93.4 kg)  Height: 5' (1.524 m)    Physical Exam  Constitutional: She is oriented to person, place, and time and well-developed, well-nourished, and in no distress.  HENT:  Head: Normocephalic and atraumatic.  Cardiovascular: Normal rate, regular rhythm and normal heart sounds.  No murmur heard. Pulmonary/Chest: Effort normal and breath sounds normal. She has no wheezes.  Musculoskeletal:  Right lower extremity wrapped in protective cushioned boot  Neurological: She is alert and oriented to person, place, and time.  Psychiatric: Mood, memory, affect and judgment normal.  Nursing note and vitals reviewed.    Assessment & Plan  1. Essential hypertension, benign Advised to check blood pressure before taking lisinopril, if it is below 110/60 mmHg, then she should not take lisinopril for that day. Will decrease dosage of lisinopril to 20 mg daily - lisinopril (PRINIVIL,ZESTRIL) 20 MG tablet; Take 1 tablet (20 mg total) by mouth daily.  Dispense: 90 tablet; Refill: 0  2. Diabetic peripheral neuropathy associated with type 2 diabetes mellitus ([place]) Being followed by neurology for peripheral neuropathy and has recently increased gabapentin  3. Osteomyelitis of right foot, unspecified type [facility]) Most reviewed from hospitalization and surgery, has a follow-up planned with podiatry   Kameryn Davern [facility] 12/28/2016 3:45 PM

## 2016-12-28 NOTE — Telephone Encounter (Signed)
error 

## 2016-12-28 NOTE — Telephone Encounter (Signed)
I called Olivia Romero to see how she was doing. She states she saw her PCP today and adjusted her blood pressure medication. She was feeling better  Discussed pathology and culture results as well. Continue antibiotics. Denies any fever, chills. No nausea, diarrhea or other issues.

## 2016-12-29 ENCOUNTER — Encounter: Payer: Self-pay | Admitting: Podiatry

## 2016-12-29 ENCOUNTER — Ambulatory Visit (INDEPENDENT_AMBULATORY_CARE_PROVIDER_SITE_OTHER): Payer: Self-pay | Admitting: Podiatry

## 2016-12-29 VITALS — BP 103/56 | HR 92

## 2016-12-29 DIAGNOSIS — L97311 Non-pressure chronic ulcer of right ankle limited to breakdown of skin: Secondary | ICD-10-CM

## 2016-12-31 DIAGNOSIS — L97311 Non-pressure chronic ulcer of right ankle limited to breakdown of skin: Secondary | ICD-10-CM | POA: Insufficient documentation

## 2016-12-31 HISTORY — DX: Non-pressure chronic ulcer of right ankle limited to breakdown of skin: L97.311

## 2016-12-31 NOTE — Progress Notes (Signed)
Subjective: Olivia Romero is a 61 y.o. is seen today in office s/p right 5th metatarsal head excision, wound debridement and toe bone biopsy preformed on 12/20/2016. She states that she is doing well. She has noticed that the wound is healing and she states that our he looks better than it did prior to surgery. She denies any pus coming from the area she denies any drainage otherwise. She denies any surrounding redness or red streaks. She has remained in a cam boot tried to stay off her foot as much as possible. She has continued antibiotic. Overall she states that she is feeling better and her blood pressure is better controlled as well as her blood sugar. Denies any systemic complaints such as fevers, chills, nausea, vomiting. No calf pain, chest pain, shortness of breath.   Objective: General: No acute distress, AAOx3, presents today with her friend  DP/PT pulses palpable 2/4, CRT < 3 sec to all digits.  Protective sensation intact. Motor function intact.  Right foot: Incision is well coapted without any evidence of dehiscence and sutures are intact. Wound on the plantar lateral aspect of the right foot submetatarsal 5. Just adjacent to the wound over the area of macerated tissue is starting to form a small scab. Overall the wound measures about the same and 1.5 x 1.2 x 0.1 cm. The wound is superficial with a granular wound base. Small amount of fibrotic tissue prior to debridement after debridement the wound was done to healthy, granular tissue. Small superficial area of skin breakdown to the dorsal aspect of the foot as well from the area previous blister.  this is starting to heal doing well.  No other open lesions or pre-ulcerative lesions.  No pain with calf compression, swelling, warmth, erythema.   Assessment and Plan:  Status post right foot surgery, doing well with no complications   -Treatment options discussed including all alternatives, risks, and complications -The wound was  sharply debrided today to healthy granular wound tissue. I do recommend continue with collagen/over dressing changes at least 3 times a week as she does have at home. She also has a home health nurse coming to her house is a dressing changes. -Continue cam boot.  -Recommended to stay off her foot as much as possible elevation.  -Continue course of antibiotics  -Monitor for any clinical signs or symptoms of infection and directed to call the office immediately should any occur or go to the ER. -RTC 1 week or sooner if needed.  Celesta Gentile, DPM

## 2017-01-02 ENCOUNTER — Telehealth: Payer: Self-pay | Admitting: Podiatry

## 2017-01-02 ENCOUNTER — Encounter: Payer: BLUE CROSS/BLUE SHIELD | Admitting: Podiatry

## 2017-01-02 NOTE — Telephone Encounter (Signed)
I informed Tillie Rung, PT [facility] of Dr. Leigh Aurora 01/02/2017 1:56pm orders.

## 2017-01-02 NOTE — Telephone Encounter (Signed)
Olivia Romero with [facility] called needing verbal orders for home health physical therapy twice a week for three weeks for left knee pain and bilateral leg weakness due to wound. You can reach me at [phone].

## 2017-01-02 NOTE — Telephone Encounter (Signed)
That is OK to do PT but I do not want her putting much weight on her foot and she is to wear the CAM boot at all times. They can work on NWB exercises to help with strength

## 2017-01-05 ENCOUNTER — Encounter: Payer: Self-pay | Admitting: Podiatry

## 2017-01-05 ENCOUNTER — Ambulatory Visit (INDEPENDENT_AMBULATORY_CARE_PROVIDER_SITE_OTHER): Payer: BLUE CROSS/BLUE SHIELD | Admitting: Podiatry

## 2017-01-05 ENCOUNTER — Telehealth: Payer: Self-pay | Admitting: Family Medicine

## 2017-01-05 DIAGNOSIS — L97511 Non-pressure chronic ulcer of other part of right foot limited to breakdown of skin: Secondary | ICD-10-CM

## 2017-01-05 DIAGNOSIS — M86171 Other acute osteomyelitis, right ankle and foot: Secondary | ICD-10-CM

## 2017-01-05 NOTE — Telephone Encounter (Signed)
[facility] Called to report that pt cancelled her home care today due to an appt.

## 2017-01-08 ENCOUNTER — Telehealth: Payer: Self-pay | Admitting: Podiatry

## 2017-01-08 ENCOUNTER — Other Ambulatory Visit: Payer: Self-pay | Admitting: Podiatry

## 2017-01-08 MED ORDER — CIPROFLOXACIN HCL 500 MG PO TABS: 500.0000 mg | ORAL_TABLET | Freq: Two times a day (BID) | ORAL | 0 refills | Status: DC

## 2017-01-08 NOTE — Telephone Encounter (Signed)
I informed pt the rx had been called to her pharmacy.

## 2017-01-08 NOTE — Progress Notes (Addendum)
Subjective: Olivia Romero is a 61 y.o. is seen today in office s/p right 5th metatarsal head excision, wound debridement and toe bone biopsy preformed on 12/20/2016. She presents today for follow-up evaluation. She states that she is doing well she's been in a cam boot. She has continue with antibiotics. She has tried a Cipro but she is continuing on clindamycin. Overall she states that she is feeling better as well. Denies any systemic complaints such as fevers, chills, nausea, vomiting. No calf pain, chest pain, shortness of breath.   Objective: General: No acute distress, AAOx3, presents today with her friend  DP/PT pulses palpable 2/4, CRT < 3 sec to all digits.  Protective sensation intact. Motor function intact.  Right foot: Incision is well coapted without any evidence of dehiscence and sutures are intact. Wound on the plantar lateral aspect of the right foot submetatarsal 5. Just adjacent to the wound over the area of macerated tissue has formed a small scab. Overall the wound measures smaller about 1.1 x 1 cm superficial. The wound is superficial with a granular wound base. Minimal amount of fibrotic tissue prior to debridement after debridement the wound was done to healthy, granular tissue. Small superficial area of skin breakdown to the dorsal aspect of the foot as well from the area previous blister.  This appears to be healed today. No other open lesions or pre-ulcerative lesions.  No pain with calf compression, swelling, warmth, erythema.   Wound Culture 5th toe: No growth Bone biopsy right 5th metatarsal clean margin: No evidence of acute osteomyelitis Toe biopsy right 5th: No evidence of acute osteomyelitis  Assessment and Plan:  Status post right foot surgery, doing well with no complications   -Treatment options discussed including all alternatives, risks, and complications -Sutures removed today without complications. After removal the incision remains well  coapted. -Overall the wound appears to be doing better. Additionally debrided when to healthy, granular tissue with a scalpel today. Recommend continue collagen/over dressing changes daily.  -Continue antibiotics to see her back next week. Continue clindamycin and ciprofloxacin. -Elevation -Monitor for any clinical signs or symptoms of infection and directed to call the office immediately should any occur or go to the ER. -RTC 1 week or sooner if needed.  Celesta Gentile, DPM  *x-ray next appointment

## 2017-01-08 NOTE — Addendum Note (Signed)
Addended by: Harriett Sine D on: 01/08/2017 04:33 PM   Modules accepted: Orders

## 2017-01-08 NOTE — Telephone Encounter (Signed)
Olivia Romero said the Pharmacy has  Not received the RX for Cipro.

## 2017-01-08 NOTE — Telephone Encounter (Signed)
I sent to her pharmacy if you could please let her know.

## 2017-01-08 NOTE — Progress Notes (Signed)
cipro sent to her pharmacy.

## 2017-01-12 ENCOUNTER — Ambulatory Visit (INDEPENDENT_AMBULATORY_CARE_PROVIDER_SITE_OTHER): Payer: BLUE CROSS/BLUE SHIELD

## 2017-01-12 ENCOUNTER — Ambulatory Visit (INDEPENDENT_AMBULATORY_CARE_PROVIDER_SITE_OTHER): Payer: BLUE CROSS/BLUE SHIELD | Admitting: Podiatry

## 2017-01-12 ENCOUNTER — Encounter: Payer: Self-pay | Admitting: Podiatry

## 2017-01-12 DIAGNOSIS — L97511 Non-pressure chronic ulcer of other part of right foot limited to breakdown of skin: Secondary | ICD-10-CM

## 2017-01-12 DIAGNOSIS — Z9889 Other specified postprocedural states: Secondary | ICD-10-CM | POA: Diagnosis not present

## 2017-01-12 DIAGNOSIS — M86171 Other acute osteomyelitis, right ankle and foot: Secondary | ICD-10-CM

## 2017-01-15 NOTE — Progress Notes (Signed)
Subjective: Olivia Romero is a 61 y.o. is seen today in office s/p right 5th metatarsal head excision, wound debridement and toe bone biopsy preformed on 12/20/2016. She says the nurses continue to come to her house and they've been changing the bandage every other day. She does continue clindamycin as well as ciprofloxacin. She feels of the wound is doing better and is healing. Denies any drainage or pus and denies any redness or red streaks. She is remaining the surgical boot. She's tried Her foot as much as possible. Denies any systemic complaints such as fevers, chills, nausea, vomiting. No calf pain, chest pain, shortness of breath.   Objective: General: No acute distress, AAOx3, presents today with her friend  DP/PT pulses palpable 2/4, CRT < 3 sec to all digits.  Protective sensation intact. Motor function intact.  Right foot: Incision is well coapted without any evidence of dehiscence and a scar has formed. Wound on the plantar lateral aspect of the right foot submetatarsal 5. Just adjacent to the wound is an area of hyperkeratotic tissue. Overall the wound measures smaller about 1 0.6 cm superficial. The wound is superficial with a granular wound base.  Small superficial area of skin breakdown to the dorsal aspect of the foot as well from the area previous blister which is healed.  No other open lesions or pre-ulcerative lesions.  No pain with calf compression, swelling, warmth, erythema.   Wound Culture 5th toe: No growth Bone biopsy right 5th metatarsal clean margin: No evidence of acute osteomyelitis Toe biopsy right 5th: No evidence of acute osteomyelitis  Assessment and Plan:  Status post right foot surgery, doing well with no complications   -Treatment options discussed including all alternatives, risks, and complications -X-rays were obtained and reviewed with the patient. No evidence of acute fracture there is no definitive evidence of acute osteomyelitis identified today.  There is no soft tissue emphysema present. -Shop and debrided the wound to the right foot without any complications to healthy granular tissue. There is mild hyperkeratotic tissue around the area. The wound is superficial appears to be healing. Continue with every other day dressing changes with collagen/Silver. -Continue cam boot. -Finish course of clindamycin and Cipro Floxin. -Elevation -Monitor for any clinical signs or symptoms of infection and directed to call the office immediately should any occur or go to the ER. -RTC 1 week or sooner if needed.  Celesta Gentile, DPM  *x-ray next appointment

## 2017-01-17 ENCOUNTER — Telehealth: Payer: Self-pay

## 2017-01-17 NOTE — Telephone Encounter (Signed)
Spoke with Caren Griffins at [facility], she states that per patient's request, can we reduce [facility] visits to Monday and Wednesday since patient comes to see Dr Jacqualyn Posey every Friday. I ok'd for the change as long as the patient is being seen by [place] on a weekly basis. If this changes, then we will call to change the orders back to 3 times a week

## 2017-01-19 ENCOUNTER — Ambulatory Visit (INDEPENDENT_AMBULATORY_CARE_PROVIDER_SITE_OTHER): Payer: BLUE CROSS/BLUE SHIELD | Admitting: Podiatry

## 2017-01-19 ENCOUNTER — Encounter: Payer: Self-pay | Admitting: Family Medicine

## 2017-01-19 ENCOUNTER — Ambulatory Visit (INDEPENDENT_AMBULATORY_CARE_PROVIDER_SITE_OTHER): Payer: BLUE CROSS/BLUE SHIELD

## 2017-01-19 ENCOUNTER — Encounter: Payer: Self-pay | Admitting: Podiatry

## 2017-01-19 ENCOUNTER — Encounter: Payer: BLUE CROSS/BLUE SHIELD | Admitting: Podiatry

## 2017-01-19 ENCOUNTER — Ambulatory Visit (INDEPENDENT_AMBULATORY_CARE_PROVIDER_SITE_OTHER): Payer: BLUE CROSS/BLUE SHIELD | Admitting: Family Medicine

## 2017-01-19 VITALS — BP 110/66 | HR 104 | Temp 98.0°F | Resp 17 | Ht 60.0 in

## 2017-01-19 DIAGNOSIS — I10 Essential (primary) hypertension: Secondary | ICD-10-CM

## 2017-01-19 DIAGNOSIS — Z9889 Other specified postprocedural states: Secondary | ICD-10-CM

## 2017-01-19 DIAGNOSIS — R4189 Other symptoms and signs involving cognitive functions and awareness: Secondary | ICD-10-CM

## 2017-01-19 DIAGNOSIS — L97511 Non-pressure chronic ulcer of other part of right foot limited to breakdown of skin: Secondary | ICD-10-CM | POA: Diagnosis not present

## 2017-01-19 NOTE — Progress Notes (Signed)
Name: Olivia Romero   MRN: [id number]    DOB: [birth year]   Date:01/19/2017       Progress Note  Subjective  Chief Complaint  Chief Complaint  Patient presents with  . Follow-up    4 mo    Hypertension  This is a chronic problem. The problem is unchanged. The problem is controlled. Pertinent negatives include no blurred vision, chest pain, headaches or palpitations. Past treatments include ACE inhibitors. Hypertensive end-organ damage includes CVA (hx of TIA 10 yrs ago). There is no history of kidney disease or CAD/MI.   Patient is also to discuss an episode a few days ago while she was sitting down at a church community gathering and she was responsible for counting money. She remembers feeling confused and not able to count the money correctly, did the correct change to a church patron. This has not happened again but patient is worried that she may have dementia (mother suffered from dementia) and wants to be tested.  Past Medical History:  Diagnosis Date  . Anemia   . Arthritis   . Asthma   . Carpal tunnel syndrome on right   . Colon polyps    adenomatous  . Diabetes mellitus without complication ([place])   . Diverticulosis of colon   . Esophagitis   . GERD (gastroesophageal reflux disease)   . Hemorrhoid    internal  . Hyperlipemia   . Hypertension   . Neuropathy   . Pneumonia   . PONV (postoperative nausea and vomiting)   . Sleep apnea    has had in the past lost 50 pounds and do longer uses cpap  . Stroke-like episode ([place]) 2009   TIA    Past Surgical History:  Procedure Laterality Date  . ABDOMINAL HYSTERECTOMY    . BACK SURGERY  09/02/2015  . CERVICAL FUSION    . CESAREAN SECTION  1986  . COLONOSCOPY N/A 11/30/2012   Procedure: COLONOSCOPY;  Surgeon: Irene Shipper, MD;  Location: [facility] ENDOSCOPY;  Service: Endoscopy;  Laterality: N/A;  . COLONOSCOPY WITH PROPOFOL N/A 01/03/2016   Procedure: COLONOSCOPY WITH PROPOFOL;  Surgeon: Manya Silvas, MD;  Location: [facility]  ENDOSCOPY;  Service: Endoscopy;  Laterality: N/A;  . ESOPHAGOGASTRODUODENOSCOPY (EGD) WITH PROPOFOL N/A 01/14/2015   Procedure: ESOPHAGOGASTRODUODENOSCOPY (EGD) WITH PROPOFOL;  Surgeon: Manya Silvas, MD;  Location: [facility] ENDOSCOPY;  Service: Endoscopy;  Laterality: N/A;  . LUMBAR WOUND DEBRIDEMENT N/A 10/01/2015   Procedure: LUMBAR WOUND DEBRIDEMENT;  Surgeon: Ashok Pall, MD;  Location: [facility] NEURO ORS;  Service: Neurosurgery;  Laterality: N/A;  LUMBAR WOUND DEBRIDEMENT  . SAVORY DILATION N/A 01/14/2015   Procedure: SAVORY DILATION;  Surgeon: Manya Silvas, MD;  Location[facility] ENDOSCOPY;  Service: Endoscopy;  Laterality: N/A;  . TONSILLECTOMY      Family History  Problem Relation Age of Onset  . Lung cancer Father   . Hypertension Father   . Arthritis Father   . Other Mother        hardening of the arteries/renal cell carcenoma  . Hypertension Mother   . Stroke Mother   . Kidney cancer Mother   . Arthritis Brother   . Rheum arthritis Maternal Uncle   . Bladder Cancer Neg Hx     Social History   Social History  . Marital status: Single    Spouse name: N/A  . Number of children: 1  . Years of education: N/A   Occupational History  . histology tech [employer]  History Main Topics  . Smoking status: Never Smoker  . Smokeless tobacco: Never Used  . Alcohol use No     Comment: wine every 6-8 months  . Drug use: No  . Sexual activity: No   Other Topics Concern  . Not on file   Social History Narrative  . No narrative on file     Current Outpatient Prescriptions:  .  acetaminophen (TYLENOL) 500 MG tablet, Take 1,000 mg by mouth every 6 (six) hours as needed for mild pain. , Disp: , Rfl:  .  albuterol (PROVENTIL HFA;VENTOLIN HFA) 108 (90 Base) MCG/ACT inhaler, Inhale 2 puffs into the lungs every 6 (six) hours as needed for wheezing or shortness of breath., Disp: 1 Inhaler, Rfl: 3 .  aspirin 81 MG tablet, Take 1 tablet (81 mg total) by mouth daily., Disp: 30  tablet, Rfl: 0 .  ciprofloxacin (CIPRO) 500 MG tablet, Take 1 tablet (500 mg total) by mouth 2 (two) times daily., Disp: 20 tablet, Rfl: 0 .  clindamycin (CLEOCIN) 300 MG capsule, Take 1 capsule (300 mg total) by mouth 3 (three) times daily., Disp: 30 capsule, Rfl: 2 .  cyclobenzaprine (FLEXERIL) 10 MG tablet, cyclobenzaprine 10 mg tablet  Take 1 tablet qhs by oral route prn spasm., Disp: , Rfl:  .  DULoxetine (CYMBALTA) 60 MG capsule, Take 60 mg by mouth at bedtime. , Disp: , Rfl:  .  fenofibrate (TRICOR) 145 MG tablet, Take 145 mg by mouth daily., Disp: , Rfl: 10 .  gabapentin (NEURONTIN) 400 MG capsule, Take 400 mg by mouth 6 (six) times daily. Reported on 12/13/2015, Disp: , Rfl:  .  glimepiride (AMARYL) 2 MG tablet, Take 2 mg by mouth 2 (two) times daily. , Disp: , Rfl:  .  HYDROcodone-acetaminophen (NORCO/VICODIN) 5-325 MG tablet, Take 1 tablet by mouth every 6 (six) hours as needed., Disp: 20 tablet, Rfl: 0 .  ibuprofen (ADVIL,MOTRIN) 200 MG tablet, Take 400 mg by mouth every 6 (six) hours as needed for mild pain., Disp: , Rfl:  .  Icosapent Ethyl (VASCEPA) 1 g CAPS, Take 2,000 mg by mouth 2 (two) times daily., Disp: 360 capsule, Rfl: 1 .  Insulin Degludec 200 UNIT/ML SOPN, Take 16 units subcutaneously once daily. Take at bedtime., Disp: , Rfl:  .  lisinopril (PRINIVIL,ZESTRIL) 20 MG tablet, Take 1 tablet (20 mg total) by mouth daily., Disp: 90 tablet, Rfl: 0 .  metFORMIN (GLUCOPHAGE) 500 MG tablet, Take 2 tablets (1,000 mg total) by mouth 2 (two) times daily with a meal., Disp: 120 tablet, Rfl: 3 .  Multiple Vitamin (MULTIVITAMIN WITH MINERALS) TABS, Take 1 tablet by mouth daily. Reported on 07/21/2015, Disp: , Rfl:  .  promethazine (PHENERGAN) 25 MG tablet, Take 1 tablet (25 mg total) by mouth every 8 (eight) hours as needed for nausea or vomiting., Disp: 20 tablet, Rfl: 0 .  tobramycin-dexamethasone (TOBRADEX) ophthalmic solution, tobramycin 0.3 %-dexamethasone 0.1 % eye drops,suspension,  Disp: , Rfl:  .  torsemide (DEMADEX) 20 MG tablet, Take 1 tablet (20 mg total) by mouth every other day., Disp: 15 tablet, Rfl: 2 .  Vitamin D, Ergocalciferol, (DRISDOL) 50000 units CAPS capsule, Take 1 capsule (50,000 Units total) by mouth once a week. For 12 weeks (Patient not taking: Reported on 01/19/2017), Disp: 12 capsule, Rfl: 0  Allergies  Allergen Reactions  . Eggs Or Egg-Derived Products Other (See Comments)    Angioedema  . Atorvastatin Other (See Comments)    Liver toxicity  . Flu Virus  Vaccine Other (See Comments)  . Latex Itching  . Pravastatin Itching and Rash     Review of Systems  Eyes: Negative for blurred vision.  Cardiovascular: Negative for chest pain and palpitations.  Neurological: Negative for headaches.     Objective  Vitals:   01/19/17 0957  BP: 110/66  Pulse: (!) 104  Resp: 17  Temp: 98 F (36.7 C)  TempSrc: Oral  SpO2: 96%  Height: 5' (1.524 m)    Physical Exam  Constitutional: She is well-developed, well-nourished, and in no distress.  HENT:  Head: Normocephalic and atraumatic.  Cardiovascular: Normal rate, regular rhythm and normal heart sounds.   No murmur heard. Pulmonary/Chest: Effort normal and breath sounds normal. She has no wheezes.  Abdominal: Soft. Bowel sounds are normal.  Musculoskeletal: She exhibits edema (trace edema Left lower extremity).  Psychiatric: Mood, memory, affect and judgment normal.  Nursing note and vitals reviewed.      Assessment & Plan  1. Essential hypertension Blood pressure is stabilized, advised to continue on present treatment regimen, check blood pressure at night and keep logs  2. Episode of altered cognition Likely an acute confusional episode based on patient's description of symptoms, currently alert and oriented, we will schedule an appointment in 1-2 weeks to perform complete diagnostic testing including MMSE   Damarion Mendizabal Asad A. [place]  [facility] 01/19/2017 10:08 AM

## 2017-01-22 NOTE — Progress Notes (Signed)
Subjective: Olivia Romero is a 61 y.o. is seen today in office s/p right 5th metatarsal head excision, wound debridement and toe bone biopsy preformed on 12/20/2016. She states that she is doing well and she is very happy with the progress of the wound is been making. She states that the wound is healing very nicely and she is very happy and excited about how it looks at this point. She denies any drainage or pus. She denies any increase in swelling around a feet. She's been in a surgical boot. She's been trying to limit activities was possible but she has been on her feet a little bit more than she has been previously. Denies any systemic complaints such as fevers, chills, nausea, vomiting. No calf pain, chest pain, shortness of breath.   Objective: General: No acute distress, AAOx3, presents today with her friend  DP/PT pulses palpable 2/4, CRT < 3 sec to all digits.  Protective sensation intact. Motor function intact.  Right foot: Incision is well coapted without any evidence of dehiscence and a scar has formed. Wound on the plantar lateral aspect of the right foot submetatarsal 5. The wound measures 0.6 x 0.5 x 0.2 cm. There is no probing, undermining or tunneling. There is no swelling erythema, ascending cellulitis. Is no flexion or crepitus there is no malodor. There is hyperkeratotic tissue adjacent to the area was debrided and the surrounding skin appears to be healthy and intact today. Small superficial area of skin breakdown to the dorsal aspect of the foot as well from the area previous blister which is healed.  No other open lesions or pre-ulcerative lesions.  No pain with calf compression, swelling, warmth, erythema.       Wound Culture 5th toe: No growth Bone biopsy right 5th metatarsal clean margin: No evidence of acute osteomyelitis Toe biopsy right 5th: No evidence of acute osteomyelitis  Assessment and Plan:  Status post right foot surgery, doing well with no complications     -Treatment options discussed including all alternatives, risks, and complications -X-rays were obtained and reviewed with the patient. No evidence of acute fracture there is no definitive evidence of acute osteomyelitis identified today. There is no soft tissue emphysema present. -I sharply debrided the wound to healthy, granular tissue today. Recommend continue with collagen/liver dressing changes 3 times a week. -Continue cam boot. -Finish course of clindamycin andciprofloxacin -Elevation -Monitor for any clinical signs or symptoms of infection and directed to call the office immediately should any occur or go to the ER. -RTC 1 week or sooner if needed.  Celesta Gentile, DPM

## 2017-01-25 ENCOUNTER — Ambulatory Visit (INDEPENDENT_AMBULATORY_CARE_PROVIDER_SITE_OTHER): Payer: BLUE CROSS/BLUE SHIELD | Admitting: Podiatry

## 2017-01-25 ENCOUNTER — Ambulatory Visit (INDEPENDENT_AMBULATORY_CARE_PROVIDER_SITE_OTHER): Payer: BLUE CROSS/BLUE SHIELD

## 2017-01-25 ENCOUNTER — Encounter: Payer: Self-pay | Admitting: Podiatry

## 2017-01-25 VITALS — BP 109/70 | HR 79 | Resp 16

## 2017-01-25 DIAGNOSIS — R600 Localized edema: Secondary | ICD-10-CM

## 2017-01-25 DIAGNOSIS — S99912A Unspecified injury of left ankle, initial encounter: Secondary | ICD-10-CM

## 2017-01-26 ENCOUNTER — Telehealth: Payer: Self-pay | Admitting: Podiatry

## 2017-01-26 NOTE — Telephone Encounter (Signed)
I told Olivia Romero Olivia Romero to continue the current wound care with the Calcium Alginate 2 x week for 4 weeks. Olivia Romero states pt's insurance will not cover dressing supplies and they are using gauze and kerlix. I will send orders for 4 x 4 gauze and kerlix from [facility].Dr. Jacqualyn Posey states order 4x4 gauze, kerlix for the right foot L97.511, measuring 0.6 x 0.5 x 0.2cm without exudate. Orders faxed to [facility].

## 2017-01-26 NOTE — Progress Notes (Signed)
Subjective:    Patient ID: Olivia Romero, female   DOB: 61 y.o.   MRN: [id number]   HPI patient states doing real well with her right foot but she did term her left ankle and it's been swollen and painful and she is worried that she may have broken something    ROS      Objective:  Physical Exam neurovascular status is noted to be unchanged with patient wearing boot right that's undisturbed sore to leave it alone currently and I evaluated the left and found there to be significant edema in the lateral ankle around the ankle joint with diminished range of motion which appears to be splinting for discomfort. The area is moderately painful when palpated and it extends down to the fifth metatarsal base     Assessment:   Significant sprained ankle left with well-healing surgical foot right      Plan:    H&P and x-rays reviewed with patient. At this time I did go ahead and applied Unna boot to try to reduce swelling and advised on surgical shoe usage which she comes out of the [employer] she may change over the boot to this foot and go to surgical shoe on the right foot. She is due to see Dr. Jacqualyn Posey and we'll keep that appointment and if any issues were to occur she is to contact us immediately  X-rays indicate that there is no indication of fracture or diastases injury with soft tissue edema noted

## 2017-01-26 NOTE — Telephone Encounter (Signed)
Caren Griffins with [facility] called requesting new orders for twice a week for four weeks. Stated pt was re-certed. She can be reached at [phone] and if she doesn't answer you can leave a detailed message on her confidential voicemail.

## 2017-01-29 ENCOUNTER — Encounter: Payer: Self-pay | Admitting: Podiatry

## 2017-01-29 ENCOUNTER — Telehealth: Payer: Self-pay | Admitting: Podiatry

## 2017-01-29 ENCOUNTER — Telehealth: Payer: Self-pay | Admitting: *Deleted

## 2017-01-29 ENCOUNTER — Ambulatory Visit (INDEPENDENT_AMBULATORY_CARE_PROVIDER_SITE_OTHER): Payer: BLUE CROSS/BLUE SHIELD | Admitting: Podiatry

## 2017-01-29 VITALS — BP 136/74 | HR 89 | Resp 18

## 2017-01-29 DIAGNOSIS — S93402D Sprain of unspecified ligament of left ankle, subsequent encounter: Secondary | ICD-10-CM | POA: Diagnosis not present

## 2017-01-29 DIAGNOSIS — R2681 Unsteadiness on feet: Secondary | ICD-10-CM

## 2017-01-29 DIAGNOSIS — L97511 Non-pressure chronic ulcer of other part of right foot limited to breakdown of skin: Secondary | ICD-10-CM

## 2017-01-29 MED ORDER — SILVER SULFADIAZINE 1 % EX CREA: 1.0000 "application " | TOPICAL_CREAM | Freq: Every day | CUTANEOUS | 0 refills | Status: DC

## 2017-01-29 NOTE — Telephone Encounter (Signed)
I informed Olivia Romero - [facility] Dr. Jacqualyn Posey agreed with routine of orders called to office 01/29/2017 1:51pm.

## 2017-01-29 NOTE — Telephone Encounter (Signed)
Tillie Rung with [facility] is requesting verbal orders to extend physical therapy twice a week for three weeks and then once a week for two weeks starting 10 September (after her boot has been discontinued). Can be reached at [phone].

## 2017-01-29 NOTE — Telephone Encounter (Addendum)
-----   Message from Trula Slade, DPM sent at 01/29/2017  8:59 AM EDT ----- Can you please do home PT for her to work on weakness to the right leg and ankle sprain on the left. Thanks. 02/02/2017-Pt states she has been having a few sharp pain in the surgery site, and the area looks a little too moist. I told pt that the sharp pains may be she had been a little too active, and pt agreed. Pt states the wound was dry but is now more moist. I asked pt if she had redness, swelling or cloudy drainage from the wound, pt denied. I told her, often in wound/ulcer healing doctors would dry out a wet wound and moisten a dry wound. I instructed pt to call if signs of infection, and if needed call after hours doctor. Pt states Dr. Jacqualyn Posey had wanted to see her in 10 days,but she was scheduled out 14 days. I told pt that Dr. Jacqualyn Posey would be back in the office at that time and if she needed an earlier appt to call and we would get her in. Pt states understanding.02/13/2017-Pt states Dr. Jacqualyn Posey had wanted her to have PT and no one has called. 02/14/2017-Pt states she has not heard from anyone concerning PT. I reviewed clinicals and orders for PT were given to [facility] on 01/29/2017. I informed pt that she should be receiving PT and have [facility] call if they need more orders.

## 2017-01-30 ENCOUNTER — Telehealth: Payer: Self-pay | Admitting: Podiatry

## 2017-01-30 NOTE — Progress Notes (Signed)
Subjective: 61 year old female presents the office they for follow-up evaluation will into the right foot second metatarsal 5. She says the nurses continue to come out maybe doing collagen/silver dressing changes 3 times a week and she states that she is very happy with the wound is looking this is best is looking over a year. She feels is healing nicely. She denies any drainage or pus and she denies any increase in swelling the right foot. She did see Dr. Paulla Dolly last week to ankle sprain. She states that the pain has much improved though still some residual pain. She states that the boot was not helping on the left foot and she is having palms balance. She also states that her right leg is weak and she's not been as active over the last year. Denies any systemic complaints such as fevers, chills, nausea, vomiting. No acute changes since last appointment, and no other complaints at this time.   Objective: AAO x3, NAD DP/PT pulses palpable bilaterally, CRT less than 3 seconds To the plantar aspect of the right foot some metatarsal 5. The wound appears to be almost healed. The scab is overlying the area and upon agreement the wound is very superficial. There is no drainage or pus expressed. There is no probing, undermining or tunneling. There is no swelling erythema, ascending cellulitis. There is no fluxions or crepitus. There is no malodor. To the left ankle there is some residual tenderness along the course the ATFL and CFL. There is no gross ankle instability present. There is no area pinpoint tenderness of the right foot or ankle. There does appear to be weakness of the right leg due to atrophy.  No open lesions or pre-ulcerative lesions.  No pain with calf compression, swelling, warmth, erythema  Assessment: Healing ulceration right foot with ankle sprain left ankle  Plan: -All treatment options discussed with the patient including all alternatives, risks, complications.  -A debrided though wound  to the right foot since metatarsal 5 and is healing well. At this point redness which is Silvadene dressing changes daily. This was ordered for her today. Continue cam boot on the right side. Monitor for any clinical signs or symptoms of infection and directed to call the office immediately should any occur or go to the ER. Regards left ankle start physical therapy, rehabilitation exercises. She is wearing the surgical shoe on the side which is not doing much at her wear more supportive shoe available. She says she cannot wear the boot on the left foot. -Given long-term immobilization the right leg as was ankle sprain the left ankle I recommended home physical therapy to work on gait training and strengthening exercises. -Follow-up as scheduled or sooner if needed. -Patient encouraged to call the office with any questions, concerns, change in symptoms.   Celesta Gentile, DPM

## 2017-01-30 NOTE — Telephone Encounter (Signed)
She can do silvadene dressing changes herself at this point. I do want home PT however.

## 2017-01-30 NOTE — Telephone Encounter (Signed)
Left message informing Olivia Romero - [facility], that Dr. Jacqualyn Posey stated pt could perform her own wound care, but to continue home PT.

## 2017-01-30 NOTE — Telephone Encounter (Signed)
Hello, this is Varney Biles one of the nurses with [facility]. I came out to visit pt this morning and she voiced that at her visit yesterday with Dr. Jacqualyn Posey she was told that can be discharged with skilled nursing with [place] and that she can do the wound care changes herself. I was wondering if there was any orders sent to [place] so we can start this discharge process. If you could, please give me a call back at [phone]. Thank you.

## 2017-01-31 ENCOUNTER — Encounter: Payer: Self-pay | Admitting: Family Medicine

## 2017-01-31 ENCOUNTER — Ambulatory Visit (INDEPENDENT_AMBULATORY_CARE_PROVIDER_SITE_OTHER): Payer: BLUE CROSS/BLUE SHIELD | Admitting: Family Medicine

## 2017-01-31 VITALS — BP 111/67 | HR 114 | Temp 98.0°F | Resp 17 | Ht 60.0 in

## 2017-01-31 DIAGNOSIS — G3184 Mild cognitive impairment, so stated: Secondary | ICD-10-CM | POA: Diagnosis not present

## 2017-01-31 LAB — CBC WITH DIFFERENTIAL/PLATELET
BASOS ABS: 97 {cells}/uL (ref 0–200)
Basophils Relative: 1 %
EOS ABS: 388 {cells}/uL (ref 15–500)
Eosinophils Relative: 4 %
HCT: 39.7 % (ref 35.0–45.0)
HEMOGLOBIN: 12.3 g/dL (ref 11.7–15.5)
LYMPHS ABS: 3104 {cells}/uL (ref 850–3900)
Lymphocytes Relative: 32 %
MCH: 25.8 pg — AB (ref 27.0–33.0)
MCHC: 31 g/dL — ABNORMAL LOW (ref 32.0–36.0)
MCV: 83.4 fL (ref 80.0–100.0)
MONO ABS: 776 {cells}/uL (ref 200–950)
MPV: 10.2 fL (ref 7.5–12.5)
Monocytes Relative: 8 %
NEUTROS ABS: 5335 {cells}/uL (ref 1500–7800)
Neutrophils Relative %: 55 %
Platelets: 446 10*3/uL — ABNORMAL HIGH (ref 140–400)
RBC: 4.76 MIL/uL (ref 3.80–5.10)
RDW: 13.5 % (ref 11.0–15.0)
WBC: 9.7 10*3/uL (ref 3.8–10.8)

## 2017-01-31 NOTE — Progress Notes (Signed)
Name: Olivia Romero   MRN: [id number]    DOB: 28-Dec-1955   Date:01/31/2017       Progress Note  Subjective  Chief Complaint  Chief Complaint  Patient presents with  . Follow-up    memory testing    HPI  Pt. Returns to be tested for memory impairment, she has had an episode of acute confusion a few weeks ago at her church where she was responsible for counting and handing out change. She has had other episodes where her daughter-in-law has noticed changes in the way she 'talks'  and 'thinks' etc.  She has been forgetting more lately, sturggling to express herself etc.  These symptoms remind her of a 'mini stroke' she had about 9 years ago and underwent extensive testing including MRI of brain etc.     Past Medical History:  Diagnosis Date  . Anemia   . Arthritis   . Asthma   . Carpal tunnel syndrome on right   . Colon polyps    adenomatous  . Diabetes mellitus without complication ([place])   . Diverticulosis of colon   . Esophagitis   . GERD (gastroesophageal reflux disease)   . Hemorrhoid    internal  . Hyperlipemia   . Hypertension   . Neuropathy   . Pneumonia   . PONV (postoperative nausea and vomiting)   . Sleep apnea    has had in the past lost 50 pounds and do longer uses cpap  . Stroke-like episode ([place]) 2009   TIA    Past Surgical History:  Procedure Laterality Date  . ABDOMINAL HYSTERECTOMY    . BACK SURGERY  09/02/2015  . CERVICAL FUSION    . CESAREAN SECTION  1986  . COLONOSCOPY N/A 11/30/2012   Procedure: COLONOSCOPY;  Surgeon: Irene Shipper, MD;  Location: [facility] ENDOSCOPY;  Service: Endoscopy;  Laterality: N/A;  . COLONOSCOPY WITH PROPOFOL N/A 01/03/2016   Procedure: COLONOSCOPY WITH PROPOFOL;  Surgeon: Manya Silvas, MD;  Location: [facility] ENDOSCOPY;  Service: Endoscopy;  Laterality: N/A;  . ESOPHAGOGASTRODUODENOSCOPY (EGD) WITH PROPOFOL N/A 01/14/2015   Procedure: ESOPHAGOGASTRODUODENOSCOPY (EGD) WITH PROPOFOL;  Surgeon: Manya Silvas, MD;  Location:  [facility] ENDOSCOPY;  Service: Endoscopy;  Laterality: N/A;  . LUMBAR WOUND DEBRIDEMENT N/A 10/01/2015   Procedure: LUMBAR WOUND DEBRIDEMENT;  Surgeon: Ashok Pall, MD;  Location: [place] NEURO ORS;  Service: Neurosurgery;  Laterality: N/A;  LUMBAR WOUND DEBRIDEMENT  . SAVORY DILATION N/A 01/14/2015   Procedure: SAVORY DILATION;  Surgeon: Manya Silvas, MD;  Location: [facility] ENDOSCOPY;  Service: Endoscopy;  Laterality: N/A;  . TONSILLECTOMY      Family History  Problem Relation Age of Onset  . Lung cancer Father   . Hypertension Father   . Arthritis Father   . Other Mother        hardening of the arteries/renal cell carcenoma  . Hypertension Mother   . Stroke Mother   . Kidney cancer Mother   . Arthritis Brother   . Rheum arthritis Maternal Uncle   . Bladder Cancer Neg Hx     Social History   Social History  . Marital status: Single    Spouse name: N/A  . Number of children: 1  . 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 Smoking status: Never Smoker  . Smokeless tobacco: Never Used  . Alcohol use No     Comment: wine every 6-8 months  .  Drug use: No  . Sexual activity: No   Other Topics Concern  . Not on file   Social History Narrative  . No narrative on file     Current Outpatient Prescriptions:  .  acetaminophen (TYLENOL) 500 MG tablet, Take 1,000 mg by mouth every 6 (six) hours as needed for mild pain. , Disp: , Rfl:  .  albuterol (PROVENTIL HFA;VENTOLIN HFA) 108 (90 Base) MCG/ACT inhaler, Inhale 2 puffs into the lungs every 6 (six) hours as needed for wheezing or shortness of breath., Disp: 1 Inhaler, Rfl: 3 .  aspirin 81 MG tablet, Take 1 tablet (81 mg total) by mouth daily., Disp: 30 tablet, Rfl: 0 .  cyclobenzaprine (FLEXERIL) 10 MG tablet, cyclobenzaprine 10 mg tablet  Take 1 tablet qhs by oral route prn spasm., Disp: , Rfl:  .  DULoxetine (CYMBALTA) 60 MG capsule, Take 60 mg by mouth at bedtime.,  Disp: , Rfl:  .  fenofibrate (TRICOR) 145 MG tablet, Take 145 mg by mouth daily., Disp: , Rfl: 10 .  gabapentin (NEURONTIN) 400 MG capsule, Take 400 mg by mouth 6 (six) times daily. Reported on 12/13/2015, Disp: , Rfl:  .  glimepiride (AMARYL) 2 MG tablet, Take 2 mg by mouth 2 (two) times daily. , Disp: , Rfl:  .  HYDROcodone-acetaminophen (NORCO/VICODIN) 5-325 MG tablet, Take 1 tablet by mouth every 6 (six) hours as needed., Disp: 20 tablet, Rfl: 0 .  ibuprofen (ADVIL,MOTRIN) 200 MG tablet, Take 400 mg by mouth every 6 (six) hours as needed for mild pain., Disp: , Rfl:  .  Icosapent Ethyl (VASCEPA) 1 g CAPS, Take 2,000 mg by mouth 2 (two) times daily., Disp: 360 capsule, Rfl: 1 .  Insulin Degludec 200 UNIT/ML SOPN, Take 16 units subcutaneously once daily. Take at bedtime., Disp: , Rfl:  .  lisinopril (PRINIVIL,ZESTRIL) 20 MG tablet, Take 1 tablet (20 mg total) by mouth daily., Disp: 90 tablet, Rfl: 0 .  metFORMIN (GLUCOPHAGE) 500 MG tablet, Take 2 tablets (1,000 mg total) by mouth 2 (two) times daily with a meal., Disp: 120 tablet, Rfl: 3 .  Multiple Vitamin (MULTIVITAMIN WITH MINERALS) TABS, Take 1 tablet by mouth daily. Reported on 07/21/2015, Disp: , Rfl:  .  promethazine (PHENERGAN) 25 MG tablet, Take 1 tablet (25 mg total) by mouth every 8 (eight) hours as needed for nausea or vomiting., Disp: 20 tablet, Rfl: 0 .  silver sulfADIAZINE (SILVADENE) 1 % cream, Apply 1 application topically daily., Disp: 50 g, Rfl: 0 .  tobramycin-dexamethasone (TOBRADEX) ophthalmic solution, tobramycin 0.3 %-dexamethasone 0.1 % eye drops,suspension, Disp: , Rfl:  .  torsemide (DEMADEX) 20 MG tablet, Take 1 tablet (20 mg total) by mouth every other day., Disp: 15 tablet, Rfl: 2  Allergies  Allergen Reactions  . Eggs Or Egg-Derived Products Other (See Comments)    Angioedema  . Atorvastatin Other (See Comments)    Liver toxicity  . Flu Virus Vaccine Other (See Comments)  . Latex Itching  . Pravastatin Itching  and Rash     Review of Systems  Constitutional: Positive for malaise/fatigue. Negative for chills and fever.  Psychiatric/Behavioral: Negative for depression. The patient has insomnia. The patient is not nervous/anxious.      Objective  Vitals:   01/31/17 1455  BP: 111/67  Pulse: (!) 114  Resp: 17  Temp: 98 F (36.7 C)  TempSrc: Oral  SpO2: 99%  Height: 5' (1.524 m)    Physical Exam  Constitutional: She is oriented to person, place,  and time and well-developed, well-nourished, and in no distress.  HENT:  Head: Normocephalic and atraumatic.  Cardiovascular: Normal rate, regular rhythm and normal heart sounds.   No murmur heard. Pulmonary/Chest: Effort normal and breath sounds normal. She has no wheezes.  Abdominal: Soft. Bowel sounds are normal.  Neurological: She is alert and oriented to person, place, and time.  Psychiatric: Mood, memory, affect and judgment normal.  Nursing note and vitals reviewed.       Assessment & Plan  1. Mild cognitive impairment with memory loss Results of 6 CIT testing indicating mild cognitive impairment, obtain pertinent lab work and imaging to rule out other potential etiologies for memory impairment and referral to neurology for further management - CBC with Differential/Platelet - TSH - B12 - CT Head Wo Contrast; Future  Note: Total face-to-face time 25 minutes, greater than 50% was spent in counseling and correlation of care with the patient and this included the time to explain the purpose of 6 CIT, administering the test, explaining the results and interpretation and arranging follow-up.  Buren Havey [facility] 01/31/2017 3:04 PM

## 2017-02-01 LAB — VITAMIN B12: Vitamin B-12: 287 pg/mL (ref 200–1100)

## 2017-02-01 LAB — TSH: TSH: 0.7 m[IU]/L

## 2017-02-02 ENCOUNTER — Other Ambulatory Visit: Payer: Self-pay

## 2017-02-02 ENCOUNTER — Encounter: Payer: Self-pay | Admitting: *Deleted

## 2017-02-02 DIAGNOSIS — D691 Qualitative platelet defects: Secondary | ICD-10-CM

## 2017-02-02 NOTE — Progress Notes (Signed)
Patient will return in 1-2 weeks for repeat labs for elevated platelets per Dr. Manuella Ghazi

## 2017-02-09 ENCOUNTER — Telehealth: Payer: Self-pay | Admitting: Family Medicine

## 2017-02-09 ENCOUNTER — Encounter: Payer: Self-pay | Admitting: Podiatry

## 2017-02-09 ENCOUNTER — Ambulatory Visit (INDEPENDENT_AMBULATORY_CARE_PROVIDER_SITE_OTHER): Payer: BLUE CROSS/BLUE SHIELD | Admitting: Podiatry

## 2017-02-09 DIAGNOSIS — S93402D Sprain of unspecified ligament of left ankle, subsequent encounter: Secondary | ICD-10-CM | POA: Diagnosis not present

## 2017-02-09 DIAGNOSIS — L97511 Non-pressure chronic ulcer of other part of right foot limited to breakdown of skin: Secondary | ICD-10-CM

## 2017-02-09 DIAGNOSIS — M216X9 Other acquired deformities of unspecified foot: Secondary | ICD-10-CM | POA: Diagnosis not present

## 2017-02-09 DIAGNOSIS — R2681 Unsteadiness on feet: Secondary | ICD-10-CM

## 2017-02-09 NOTE — Progress Notes (Signed)
Subjective: 61 year old female presents the office they for follow-up evaluation will into the right foot second metatarsal 5. She states that she is doing much better she has noticed a scab formed over the area of the wound. She's been using Silvadene dressing changes daily. She denies any drainage or pus or swelling to the area. She is concerned because she is continuing to wear the boot for starting to rub the office after her foot and she points to the fifth metatarsal base. She has noticed a small scab overlying this area. The question her left ankle she states that her entire life her ankle is rolled out but his been doing more over the last several months. She does wear shoes which seems to help. She also has orthotics. She will likely be started physical therapy soon. Denies any systemic complaints such as fevers, chills, nausea, vomiting. No acute changes since last appointment, and no other complaints at this time.   Objective: AAO x3, NAD DP/PT pulses palpable bilaterally, CRT less than 3 seconds On the plantar aspect of the right foot submetatarsal 5 is a scab present. There is a small amount of epidermolysis from the area which is able to debridement did not take off the entire scab. No open wounds identified. There is no surrounding redness or drainage. There is no clinical signs of infection. There is no drainage or pus. There is no fluctuance or crepitus. Is no malodor. On the fifth metatarsal bases area this very small scab from what is rubbing said the boot. No drainage or pus or signs of infection. To the left ankle there is no smoking ankle swelling present however there is a cavus foot type. She started the pressure to the outside aspect of the foot submetatarsal 5 and left foot as well as some erythema due to pressure. There is no skin breakdown and this is pre-ulcerative. There is no tenderness along the ATFL, CFL or other ankle ligaments. There is no area pinpoint tenderness pain  vibratory sensation. No open lesions or pre-ulcerative lesions.  No pain with calf compression, swelling, warmth, erythema  Assessment: Healing ulceration right foot with ankle sprain left which is improving however cavus foot type.  Plan: -All treatment options discussed with the patient including all alternatives, risks, complications.  -Lightly debrided the area to the right foot second metatarsal 5 the area over the wound. There is no sign of infection. Recommend a small amount of antibiotic ointment dressing changes daily. Per his switch to a surgical shoe today to help take pressure off the fifth metatarsal base to keep from rubbing inside the boot. Offloading pad was applied to the surgical shoe. Monitor for any clinical signs or symptoms of infection and directed to call the office immediately should any occur or go to the ER. -Regards the left ankle I discussed with her it changed shoes. Also we had on discussion regards her orthotics. I'll notify like her wearing a leather harder insert. I would have versus comeback in 10 days and have her evaluated by Liliane Channel is well estimated as softer cover. Starting get a pre-ulcerative area to set metatarsal 5 in the left foot as well. We'll bring her shoes and that she typically wears as well with her stomach and evaluate this. -Can start PT but look at foot daily.  -Monitor for any clinical signs or symptoms of infection and directed to call the office immediately should any occur or go to the ER. -RTC 10 days or sooner if needed. Daily  foot inspection needed!  Celesta Gentile, DPM

## 2017-02-09 NOTE — Telephone Encounter (Signed)
Olivia Romero from Pre-op is requesting return call. She is needing a Pre-op before 2pm today or patient will have to resch. She can be reached at [phone]

## 2017-02-12 ENCOUNTER — Ambulatory Visit: Payer: BLUE CROSS/BLUE SHIELD

## 2017-02-16 ENCOUNTER — Ambulatory Visit: Payer: BLUE CROSS/BLUE SHIELD

## 2017-02-19 ENCOUNTER — Ambulatory Visit (INDEPENDENT_AMBULATORY_CARE_PROVIDER_SITE_OTHER): Payer: BLUE CROSS/BLUE SHIELD | Admitting: Podiatry

## 2017-02-19 ENCOUNTER — Encounter: Payer: Self-pay | Admitting: Podiatry

## 2017-02-19 VITALS — BP 142/81 | HR 94 | Resp 18

## 2017-02-19 DIAGNOSIS — L97511 Non-pressure chronic ulcer of other part of right foot limited to breakdown of skin: Secondary | ICD-10-CM

## 2017-02-19 DIAGNOSIS — S93402D Sprain of unspecified ligament of left ankle, subsequent encounter: Secondary | ICD-10-CM

## 2017-02-19 NOTE — Progress Notes (Signed)
Subjective: Olivia Romero presents the office today for follow-up evaluation of a wound to the right foot as well as left ankle sprain. She is a left ankle doing much better she has no pain today. She has occasional intermittent discomfort in the left ankle but not significant as much improved. She states that she is continued antibiotic ointment on the wound area to the right foot. She currently denies any drainage or any possible denies any swelling or redness to her feet. She has no other concerns today. Denies any systemic complaints such as fevers, chills, nausea, vomiting. No acute changes since last appointment, and no other complaints at this time.   Objective: AAO x3, NAD DP/PT pulses palpable bilaterally, CRT less than 3 seconds There is noted to palpation to the left ankle. Along the course of the peroneal tendon just inferior to the lateral malleolus objectively she gets some discomfort in this area which is having no pain today. The tendon appears intact. There is no area pinpoint tenderness left foot or ankle. Distal swelling to the area. On the right foot submetatarsal 5 area is a scab. I lightly debrided this area and appears to be healing. There is no definitive open sores identified same area starting to scab over. This area is still pre-ulcerative. There is no drainage pus there is no swelling erythema, ascending cellulitis. There was a pre-ulcerative callus to the fifth metatarsal base and the right foot after debridement the underlying skin appears to be healed and there is new, pink skin present. No other open lesions or pre-ulcer lesions identified today. No open lesions or pre-ulcerative lesions.  No pain with calf compression, swelling, warmth, erythema      Assessment: Healing wound right some metatarsal 5, no pain to left ankle today  Plan: -All treatment options discussed with the patient including all alternatives, risks, complications.  -I lightly debrided the scab to  the right fifth metatarsal head. The area patient be healing well. There is no signs of infection. Recommend a small amount of antibiotic ointment dressing changes daily. Continue surgical shoe and offloading. -No pain to the left ankle. Continue to monitor for any reoccurrence. -I would like for her to bring her orthotics and for visit to evaluate them. She forgot to bring them today. She has a leather top cover to the insert and I think that she is to have a softer top cover as well as offloading bilateral submetatarsal 5 more. She is scheduling to see Liliane Channel this week for this. -Follow-up with me in 2 weeks or sooner if any issues are to arise. -Monitor for any clinical signs or symptoms of infection and directed to call the office immediately should any occur or go to the ER.  Olivia Romero, DPM

## 2017-02-20 ENCOUNTER — Other Ambulatory Visit: Payer: BLUE CROSS/BLUE SHIELD | Admitting: Orthotics

## 2017-02-28 LAB — CBC WITH DIFFERENTIAL/PLATELET
Basophils Absolute: 77 cells/uL (ref 0–200)
Basophils Relative: 0.9 %
Eosinophils Absolute: 425 cells/uL (ref 15–500)
Eosinophils Relative: 5 %
HCT: 34.9 % — ABNORMAL LOW (ref 35.0–45.0)
HEMOGLOBIN: 11.2 g/dL — AB (ref 11.7–15.5)
Lymphs Abs: 2414 cells/uL (ref 850–3900)
MCH: 25.6 pg — AB (ref 27.0–33.0)
MCHC: 32.1 g/dL (ref 32.0–36.0)
MCV: 79.9 fL — AB (ref 80.0–100.0)
MONOS PCT: 8 %
MPV: 11.3 fL (ref 7.5–12.5)
NEUTROS PCT: 57.7 %
Neutro Abs: 4905 cells/uL (ref 1500–7800)
Platelets: 353 10*3/uL (ref 140–400)
RBC: 4.37 10*6/uL (ref 3.80–5.10)
RDW: 12.9 % (ref 11.0–15.0)
Total Lymphocyte: 28.4 %
WBC mixed population: 680 cells/uL (ref 200–950)
WBC: 8.5 10*3/uL (ref 3.8–10.8)

## 2017-03-08 ENCOUNTER — Ambulatory Visit (INDEPENDENT_AMBULATORY_CARE_PROVIDER_SITE_OTHER): Payer: BLUE CROSS/BLUE SHIELD | Admitting: Podiatry

## 2017-03-08 ENCOUNTER — Encounter: Payer: Self-pay | Admitting: Podiatry

## 2017-03-08 DIAGNOSIS — L97511 Non-pressure chronic ulcer of other part of right foot limited to breakdown of skin: Secondary | ICD-10-CM

## 2017-03-09 ENCOUNTER — Ambulatory Visit (INDEPENDENT_AMBULATORY_CARE_PROVIDER_SITE_OTHER): Payer: BLUE CROSS/BLUE SHIELD

## 2017-03-09 DIAGNOSIS — M216X9 Other acquired deformities of unspecified foot: Secondary | ICD-10-CM

## 2017-03-09 NOTE — Patient Instructions (Signed)

## 2017-03-09 NOTE — Progress Notes (Signed)
Subjective: Ms. Krouse presents the office today for follow-up evaluation of a wound to the right foot as well as left ankle sprain. She says the left ankle doing well she's having no pain. She is at the wound on the right foot is doing well and she states that she believes that she is ready to go back into his shoe. She is excited to get her orthotics. She also states that physical therapy has been coming to her house and this is also been helping. She denies any swelling to her feet or ankles she denies any redness or warmth. She's had no drainage coming from the wound area of the right foot. Denies any systemic complaints such as fevers, chills, nausea, vomiting. No acute changes since last appointment, and no other complaints at this time.   Objective: AAO x3, NAD DP/PT pulses palpable bilaterally, CRT less than 3 seconds The left ankle to well-nourished no tenderness. There is no significant edema, erythema, increase in warmth. Cavus foot that is present. On the right first metatarsal 5 is a scab, hyperkeratotic lesion. After debridement was able to remove the majority of this however small central scab does remain there is no definitive opening present. There is no surrounding erythema, ascending cellulitis. There is no fluctuance or crepitus. There is no malodor. Overall this appears to be much improved. No other open lesions or pre-ulcerative lesions identified bilaterally. No pain with calf compression, swelling, warmth, erythema        Assessment: Healing wound right some metatarsal 5, no pain to left ankle today  Plan: -All treatment options discussed with the patient including all alternatives, risks, complications.  -I debrided the scab, hyperkeratotic lesion right foot there is no underlying ulceration or any signs of infection present. She is ready to go back into an orthotic is able however joint acute a small bandage on this area for protection. Unfortunately orthotics have  not arrived yet. We did call [facility] with a were sent to follow-up on this. We will call or sooner if the common. For now continue in the surgical shoe. -Continue physical therapy. -Monitor for any skin breakdown -Monitor for any clinical signs or symptoms of infection and directed to call the office immediately should any occur or go to the ER. -RTC 3 weeks or sooner if needed.  Celesta Gentile, DPM

## 2017-03-09 NOTE — Progress Notes (Signed)
Patient presents for orthotic pick up.  Verbal and written break in and wear instructions given.  Patient will follow up in 4 weeks if symptoms worsen or fail to improve.   Trilock brace was dispensed to help support Lt ankle. She is to follow up in 2 weeks, diabetic shoe paperwork initiated and she will be casted for a custom brace in the future.

## 2017-03-14 ENCOUNTER — Ambulatory Visit: Payer: BLUE CROSS/BLUE SHIELD | Admitting: Family Medicine

## 2017-03-16 ENCOUNTER — Ambulatory Visit (INDEPENDENT_AMBULATORY_CARE_PROVIDER_SITE_OTHER): Payer: BLUE CROSS/BLUE SHIELD | Admitting: Family Medicine

## 2017-03-21 ENCOUNTER — Telehealth: Payer: Self-pay | Admitting: Family Medicine

## 2017-03-21 NOTE — Telephone Encounter (Signed)
Pt came by on last week and spoke the nurse (does not remember the nurse name). She was informed that a referral to Dr Manuella Ghazi at [facility] (neurology) would be placed. However she has not heard anything from them pertaining to the referral. Please return call to discuss [phone]

## 2017-03-22 ENCOUNTER — Other Ambulatory Visit (HOSPITAL_COMMUNITY): Payer: Self-pay

## 2017-03-22 DIAGNOSIS — G3184 Mild cognitive impairment, so stated: Secondary | ICD-10-CM

## 2017-03-22 NOTE — Telephone Encounter (Signed)
We are still waiting for CT scan of the head for workup of mild cognitive impairment with memory loss. She may be referred to neurology with the diagnosis of mild cognitive impairment. Thank you

## 2017-03-22 NOTE — Progress Notes (Signed)
This encounter was created in error - please disregard.

## 2017-03-22 NOTE — Progress Notes (Signed)
Referral to neurology is already in place.

## 2017-03-23 ENCOUNTER — Other Ambulatory Visit: Payer: Self-pay

## 2017-03-23 NOTE — Telephone Encounter (Signed)
CT scan was denied  through her insurance therefore a referral was put in yesterday. Pt has been notified

## 2017-03-30 ENCOUNTER — Ambulatory Visit (INDEPENDENT_AMBULATORY_CARE_PROVIDER_SITE_OTHER): Payer: BLUE CROSS/BLUE SHIELD | Admitting: Podiatry

## 2017-03-30 ENCOUNTER — Encounter: Payer: Self-pay | Admitting: Podiatry

## 2017-03-30 DIAGNOSIS — L97511 Non-pressure chronic ulcer of other part of right foot limited to breakdown of skin: Secondary | ICD-10-CM

## 2017-03-30 DIAGNOSIS — M216X9 Other acquired deformities of unspecified foot: Secondary | ICD-10-CM

## 2017-04-03 NOTE — Progress Notes (Signed)
Subjective: Olivia Romero presents the office today for follow-up evaluation of the wound to the right foot. She said the wound area that we did surgery is doing. Well and she is very close any redness or drainage or any swelling. She does have concerns of a new blister type. They also asked that her right foot. She has been wearing the inserts in a regular shoe and she's purchase new shoes and since doing this she's noticed a new area form. She is not noticed drainage or redness or any swelling. Denies any systemic complaints such as fevers, chills, nausea, vomiting. No acute changes since last appointment, and no other complaints at this time.   Objective: AAO x3, NAD DP/PT pulses palpable bilaterally, CRT less than 3 seconds Incision from the prior surgery is well-healed. The hyperkeratotic lesion over the area to previous the metatarsal pipe wound has a small scab after debridement there is no underlying ulceration, drainage or any signs of infection present today. On the fifth metatarsal base laterally does appear to have a area of what appears to be old blister but there is no drainage. There is no swelling erythema, fluctuation or crepitus there is no skin breakdown identified at this time but is pre-ulcerative.  Cavus deformity to the left foot there is no pain. Is no open sores. She cannot wear the orthotic on the left side because it causes her rule out even more.  No open lesions or pre-ulcerative lesions.  No pain with calf compression, swelling, warmth, erythema  Assessment: 61 year old female with healed wound submetatarsal 5 new pre-ulcerative area fifth metatarsal base right foot ; cavus deformity left   Plan: -All treatment options discussed with the patient including all alternatives, risks, complications.  -I debrided the hyperkeratotic lesion over the area of the previous wound without any complications or bleeding and the wound appears to be healed. However she does have new area on  the fifth metatarsal base which is pre-ulcerative. I recommend her to return to the surgical shoe and stop wearing her regular shoe.  -I had Liliane Channel evaluate her and using a modified the orthotics. -Monitor for any clinical signs or symptoms of infection and directed to call the office immediately should any occur or go to the ER. -Patient encouraged to call the office with any questions, concerns, change in symptoms.   Celesta Gentile, DPM

## 2017-04-05 ENCOUNTER — Other Ambulatory Visit: Payer: BLUE CROSS/BLUE SHIELD | Admitting: Orthotics

## 2017-04-10 ENCOUNTER — Ambulatory Visit: Payer: BLUE CROSS/BLUE SHIELD | Admitting: Orthotics

## 2017-04-10 DIAGNOSIS — M216X9 Other acquired deformities of unspecified foot: Secondary | ICD-10-CM

## 2017-04-10 DIAGNOSIS — S93402D Sprain of unspecified ligament of left ankle, subsequent encounter: Secondary | ICD-10-CM

## 2017-04-10 DIAGNOSIS — L97511 Non-pressure chronic ulcer of other part of right foot limited to breakdown of skin: Secondary | ICD-10-CM

## 2017-04-10 NOTE — Progress Notes (Signed)
Patient returned for f/up visit to make sure F/o goals were being met.  She is satisfied with navicular offload and valgus wedge.

## 2017-04-18 ENCOUNTER — Other Ambulatory Visit: Payer: Self-pay | Admitting: Neurology

## 2017-04-18 ENCOUNTER — Other Ambulatory Visit (HOSPITAL_COMMUNITY): Payer: Self-pay | Admitting: Neurology

## 2017-04-18 DIAGNOSIS — R413 Other amnesia: Secondary | ICD-10-CM

## 2017-04-20 ENCOUNTER — Encounter: Payer: Self-pay | Admitting: Podiatry

## 2017-04-20 ENCOUNTER — Ambulatory Visit (INDEPENDENT_AMBULATORY_CARE_PROVIDER_SITE_OTHER): Payer: BLUE CROSS/BLUE SHIELD | Admitting: Podiatry

## 2017-04-20 DIAGNOSIS — M216X9 Other acquired deformities of unspecified foot: Secondary | ICD-10-CM

## 2017-04-20 DIAGNOSIS — L97511 Non-pressure chronic ulcer of other part of right foot limited to breakdown of skin: Secondary | ICD-10-CM | POA: Diagnosis not present

## 2017-04-20 DIAGNOSIS — E1140 Type 2 diabetes mellitus with diabetic neuropathy, unspecified: Secondary | ICD-10-CM | POA: Diagnosis not present

## 2017-04-20 DIAGNOSIS — E1149 Type 2 diabetes mellitus with other diabetic neurological complication: Secondary | ICD-10-CM

## 2017-04-25 NOTE — Progress Notes (Signed)
Subjective: Leana presents the office today for evaluation of right foot wounds as well as callus forming the left side.  She states that she is doing much better on the right foot she has not noticed any open sores or drainage or redness or swelling.  She denies any blisters.  She has no new concerns. Denies any systemic complaints such as fevers, chills, nausea, vomiting. No acute changes since last appointment, and no other complaints at this time.   Objective: AAO x3, NAD DP/PT pulses palpable bilaterally, CRT less than 3 seconds On the right foot home with metatarsal base hyperkeratotic lesion.  Also small hyperkeratotic lesion along the area of the previous wound submetatarsal 5.  There is no underlying ulceration, drainage or any signs of infection noted.  There is no overlying edema, erythema, increased warmth bilaterally. Cavus foot deformity is present No open lesions or pre-ulcerative lesions.  No pain with calf compression, swelling, warmth, erythema  Assessment: Healed ulcerations right foot; cavus deformity with history of wounds as well as diabetes neuropathy  Plan: -All treatment options discussed with the patient including all alternatives, risks, complications.  -There is only minimal hyperkeratotic tissue in the area the previous wounds and this was debrided without any complications or bleeding to confirm the underlying wounds of healed. -Given that he was aforementioned likely benefit from a brace but she was told up on that she is been using the Tri-Lock.  We discussed custom shoe, boot to help take pressure off the foot as well as help with her ankle. -Monitor for any clinical signs or symptoms of infection and directed to call the office immediately should any occur or go to the ER. -Patient encouraged to call the office with any questions, concerns, change in symptoms.   Trula Slade DPM

## 2017-04-28 ENCOUNTER — Ambulatory Visit (HOSPITAL_COMMUNITY)
Admission: RE | Admit: 2017-04-28 | Discharge: 2017-04-28 | Disposition: A | Discharge status: Home / Self Care | Payer: BLUE CROSS/BLUE SHIELD | Source: Ambulatory Visit | Attending: Neurology | Admitting: Neurology

## 2017-04-28 DIAGNOSIS — R413 Other amnesia: Secondary | ICD-10-CM | POA: Diagnosis present

## 2017-04-28 DIAGNOSIS — G939 Disorder of brain, unspecified: Secondary | ICD-10-CM | POA: Diagnosis not present

## 2017-05-18 ENCOUNTER — Ambulatory Visit (INDEPENDENT_AMBULATORY_CARE_PROVIDER_SITE_OTHER): Payer: BLUE CROSS/BLUE SHIELD | Admitting: Podiatry

## 2017-05-18 DIAGNOSIS — M216X9 Other acquired deformities of unspecified foot: Secondary | ICD-10-CM | POA: Diagnosis not present

## 2017-05-18 DIAGNOSIS — Q828 Other specified congenital malformations of skin: Secondary | ICD-10-CM | POA: Diagnosis not present

## 2017-05-18 DIAGNOSIS — E1149 Type 2 diabetes mellitus with other diabetic neurological complication: Secondary | ICD-10-CM

## 2017-05-18 DIAGNOSIS — R2681 Unsteadiness on feet: Secondary | ICD-10-CM | POA: Diagnosis not present

## 2017-05-18 DIAGNOSIS — E1140 Type 2 diabetes mellitus with diabetic neuropathy, unspecified: Secondary | ICD-10-CM

## 2017-05-21 ENCOUNTER — Telehealth: Payer: Self-pay | Admitting: *Deleted

## 2017-05-21 DIAGNOSIS — M216X9 Other acquired deformities of unspecified foot: Secondary | ICD-10-CM

## 2017-05-21 DIAGNOSIS — R2681 Unsteadiness on feet: Secondary | ICD-10-CM

## 2017-05-21 NOTE — Telephone Encounter (Signed)
Ddr. Jacqualyn Posey ordered PT [facility] for cavus deformity of foot left, left lateral ankle instability, gait training.

## 2017-05-21 NOTE — Progress Notes (Signed)
Subjective: Olivia Romero presents to the office today for follow-up evaluation of the pre-ulcerative callus of the right foot but also she has concerns today for left ankle turning in more.  She states that she is a family history of this in her ankles been hurting in more recently.  She is asking about a diabetic shoe which is a high top to help with her ankle.  She denies any sores or swelling to the right ankle denies any pain. Denies any systemic complaints such as fevers, chills, nausea, vomiting. No acute changes since last appointment, and no other complaints at this time.   Objective: AAO x3, NAD DP/PT pulses palpable bilaterally, CRT less than 3 seconds On the right foot home with metatarsal base hyperkeratotic lesion.  Also small hyperkeratotic lesion along the area of the previous wound submetatarsal 5.  There is no underlying ulceration, drainage or any signs of infection noted.  There is no overlying edema, erythema, increased warmth bilaterally. Cavus foot deformity is present on the left side which is reducible to almost a rectus position.  There is oversupination present on the left ankle.  There is no tenderness No open lesions or pre-ulcerative lesions.  No pain with calf compression, swelling, warmth, erythema  Assessment: Healed ulcerations right foot; cavus deformity with over supination left side.   Plan: -All treatment options discussed with the patient including all alternatives, risks, complications.  -Sharply debrided the hyperkeratotic lesion on the right foot today without any complications or bleeding. -We had a discussion regards to a high top shoe on the left side versus a custom brace.  I had with evaluate her today as well and after discussion she would likely benefit more from an [place] brace. Olivia Romero molded her today for this.  For now continue with the Tri-Lock ankle brace. -Monitor daily for any skin breakdown or any issues. -Monitor for any clinical signs or symptoms  of infection and directed to call the office immediately should any occur or go to the ER. -Patient encouraged to call the office with any questions, concerns, change in symptoms.   Olivia Romero DPM

## 2017-06-07 ENCOUNTER — Telehealth: Payer: Self-pay | Admitting: Podiatry

## 2017-06-14 ENCOUNTER — Ambulatory Visit: Payer: BLUE CROSS/BLUE SHIELD | Admitting: Podiatry

## 2017-06-14 ENCOUNTER — Encounter: Payer: Self-pay | Admitting: Podiatry

## 2017-06-14 DIAGNOSIS — R600 Localized edema: Secondary | ICD-10-CM | POA: Diagnosis not present

## 2017-06-14 DIAGNOSIS — M779 Enthesopathy, unspecified: Secondary | ICD-10-CM

## 2017-06-14 DIAGNOSIS — M216X9 Other acquired deformities of unspecified foot: Secondary | ICD-10-CM

## 2017-06-14 MED ORDER — TRIAMCINOLONE ACETONIDE 10 MG/ML IJ SUSP
10.0000 mg | Freq: Once | INTRAMUSCULAR | Status: AC
  Administered 2017-06-14: 10 mg

## 2017-06-14 NOTE — Progress Notes (Signed)
Subjective:   Patient ID: Olivia Romero, female   DOB: 62 y.o.   MRN: [id number]   HPI Patient presents with significant cavus deformity left and states her left ankle is absolutely killing her with swelling and she needs something done immediately before she sees Dr. Carman Ching in the next several weeks   ROS      Objective:  Physical Exam  Neurovascular status unchanged with significant cavus deformity left with inversion of the subtalar joint and inability to reduce the deformity currently.  Quite a bit of discomfort in the sinus tarsi lateral ankle gutter and +1 pitting edema with negative Homans sign was noted     Assessment:  Inflammatory condition with hopeful acute manifestations with chronic structural changes and possibility for Charcot foot structure     Plan:  H&P condition reviewed and careful injection sinus tarsi lateral ankle gutter administered 3 mg Kenalog 5 mg Xylocaine.  I then applied an Unna boot to reduce the swelling and try to take pressure off the foot and patient will be seen back for a visit by Dr. Carman Ching in the next 2 weeks

## 2017-06-18 ENCOUNTER — Ambulatory Visit: Payer: BLUE CROSS/BLUE SHIELD | Admitting: Podiatry

## 2017-06-18 ENCOUNTER — Other Ambulatory Visit: Payer: BLUE CROSS/BLUE SHIELD | Admitting: Orthotics

## 2017-06-20 ENCOUNTER — Ambulatory Visit: Payer: BLUE CROSS/BLUE SHIELD | Admitting: Podiatry

## 2017-06-20 ENCOUNTER — Encounter: Payer: Self-pay | Admitting: Podiatry

## 2017-06-20 ENCOUNTER — Other Ambulatory Visit: Payer: Self-pay | Admitting: Podiatry

## 2017-06-20 ENCOUNTER — Ambulatory Visit (INDEPENDENT_AMBULATORY_CARE_PROVIDER_SITE_OTHER): Payer: BLUE CROSS/BLUE SHIELD

## 2017-06-20 DIAGNOSIS — M2450 Contracture, unspecified joint: Secondary | ICD-10-CM

## 2017-06-20 DIAGNOSIS — M216X9 Other acquired deformities of unspecified foot: Secondary | ICD-10-CM

## 2017-06-20 DIAGNOSIS — Q828 Other specified congenital malformations of skin: Secondary | ICD-10-CM

## 2017-06-20 DIAGNOSIS — E1149 Type 2 diabetes mellitus with other diabetic neurological complication: Secondary | ICD-10-CM

## 2017-06-20 DIAGNOSIS — L539 Erythematous condition, unspecified: Secondary | ICD-10-CM

## 2017-06-20 DIAGNOSIS — M79675 Pain in left toe(s): Secondary | ICD-10-CM

## 2017-06-20 MED ORDER — CEPHALEXIN 500 MG PO CAPS: 500.0000 mg | ORAL_CAPSULE | Freq: Three times a day (TID) | ORAL | 0 refills | Status: DC

## 2017-06-24 NOTE — Progress Notes (Signed)
Subjective: Olivia Romero presents the office today for concerns of her left fifth toe becoming contracted and sitting in the area has been rubbing on her shoes causing some redness.  She denies any open sore.  She denies any recent injury or trauma.  She states that she has had a high arch on her left foot for the majority of her life but has been worsening and she been walking on the outside of her foot.  She is concerned that she is been developed a wound on the left foot.  She says the right side is doing well. Denies any systemic complaints such as fevers, chills, nausea, vomiting. No acute changes since last appointment, and no other complaints at this time.   Objective: AAO x3, NAD DP/PT pulses palpable bilaterally, CRT less than 3 seconds Minimal hyperkeratotic tissue right submetatarsal 5 over the area the previous ulceration area upon debridement there is no underlying ulceration, drainage or any clinical signs of infection noted today. Cavus deformity present with the left side worse than the right.  The fifth digit is contracted at the level of the MPJ and there is erythema to the fifth toe on the left side but there is no increase in warmth or swelling and this is likely more from inflammation as opposed to infection.  There is no skin breakdown.  There is also some mild erythema due to pressure on the fifth metatarsal base as well as the fifth metatarsal head.  Again this is more from irritation as opposed to infection.  There is no increase in swelling to the foot there is no fluctuation or crepitation. No open lesions or pre-ulcerative lesions.  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  -X-rays were obtained and reviewed.  There is no evidence  of acute fracture identified today and there is no evidence of acute osteomyelitis identified. -She is scheduled to pick up a brace next week on Tuesday.  She is wearing old shoe and will go ahead and order her a new pair shoes to fit inside of her brace.  She is in a pickup style shoe will get these ordered on Tuesday when she comes in to see [place].  In the meantime I dispensed a short Cam boot because I think her shoe is putting a lot of pressure under the area and I want to take pressure off. -I debrided the hyperkeratotic lesion to the right foot today without any complications or bleeding -She has an appointment on Tuesday to pick up inserts and to see me and I will see her if needed. -Discussed daily foot inspection -Patient encouraged to call the office with any questions, concerns, change in symptoms.   Trula Slade DPM

## 2017-06-26 ENCOUNTER — Ambulatory Visit: Payer: BLUE CROSS/BLUE SHIELD | Admitting: Orthotics

## 2017-06-26 ENCOUNTER — Encounter: Payer: BLUE CROSS/BLUE SHIELD | Admitting: Podiatry

## 2017-06-26 DIAGNOSIS — S93402D Sprain of unspecified ligament of left ankle, subsequent encounter: Secondary | ICD-10-CM

## 2017-06-26 DIAGNOSIS — M216X9 Other acquired deformities of unspecified foot: Secondary | ICD-10-CM

## 2017-06-26 DIAGNOSIS — R2681 Unsteadiness on feet: Secondary | ICD-10-CM

## 2017-06-26 NOTE — Progress Notes (Signed)
Brace fit patient well and she reported she like it. Will pick up and drop charges when shoes comes in that will accommodate brace.

## 2017-06-27 NOTE — Progress Notes (Signed)
Cancelled by pt- I did see her last week

## 2017-07-10 ENCOUNTER — Ambulatory Visit: Payer: BLUE CROSS/BLUE SHIELD | Admitting: Podiatry

## 2017-07-10 ENCOUNTER — Encounter: Payer: Self-pay | Admitting: Podiatry

## 2017-07-10 ENCOUNTER — Ambulatory Visit (INDEPENDENT_AMBULATORY_CARE_PROVIDER_SITE_OTHER): Payer: BLUE CROSS/BLUE SHIELD | Admitting: Orthotics

## 2017-07-10 DIAGNOSIS — M216X9 Other acquired deformities of unspecified foot: Secondary | ICD-10-CM

## 2017-07-10 DIAGNOSIS — L539 Erythematous condition, unspecified: Secondary | ICD-10-CM

## 2017-07-10 DIAGNOSIS — E1149 Type 2 diabetes mellitus with other diabetic neurological complication: Secondary | ICD-10-CM

## 2017-07-10 DIAGNOSIS — M2450 Contracture, unspecified joint: Secondary | ICD-10-CM

## 2017-07-10 DIAGNOSIS — R2681 Unsteadiness on feet: Secondary | ICD-10-CM

## 2017-07-10 DIAGNOSIS — Q828 Other specified congenital malformations of skin: Secondary | ICD-10-CM

## 2017-07-10 DIAGNOSIS — E1140 Type 2 diabetes mellitus with diabetic neuropathy, unspecified: Secondary | ICD-10-CM

## 2017-07-10 NOTE — Progress Notes (Signed)
Patient came in today to pick up [place].  Patient was evaluated for fit and function.   The brace fit very well and there were any complaints of the way it felt once donned.  The brace offered ankle stability in both saggital and coroneal planes.  Patient advised to always wear proper fitting shoes with brace.  Patient also given moore balance shoe to help accomodate brace.

## 2017-07-11 NOTE — Progress Notes (Signed)
Subjective: Olivia Romero presents the office today for follow-up of evaluation of her left ankle turning inwards as well as her fifth toe contracture.  She states that she cannot do physical therapy due to cost.  She did get the Richie brace this morning from [facility] and she states that since wearing that she has been walking better and this has been helping.  She still concerned because she is getting red spots on her left foot she does not with areas of breakdown into her wound.  She also has a small callus in the right foot that should have checked.  Denies any open sores and she denies any drainage. She has no other concerns.  She does get some swelling to the legs that she has Lasix for that she takes intermittently. She has not taken it recently.  She takes this as needed.  She has no other complaints today. Denies any systemic complaints such as fevers, chills, nausea, vomiting. No acute changes since last appointment, and no other complaints at this time.   Objective: AAO x3, NAD DP/PT pulses palpable bilaterally, CRT less than 3 seconds On the left foot there is areas of erythema due to pressure on the fifth metatarsal base of the fifth metatarsal head there is no skin breakdown.  On the right foot there is mild hyperkeratotic tissue submetatarsal 5 over the area the previous wound.  Upon debridement there was no underlying ulceration, drainage or any clinical signs of infection noted.  There is cavus deformity to the left ankle but unable to reduce into a rectus position.  Upon he will walk into the office she is also walking much better with the brace.  No open lesions or pre-ulcerative lesions.  No pain with calf compression, swelling, warmth, erythema  Assessment: Pre-ulcerative areas bilateral feet  Plan: -All treatment options discussed with the patient including all alternatives, risks, complications.  -I did debride the hyperkeratotic lesion to the right foot without any complications or  bleeding.  She did get new shoes today as well and hopefully this will help take pressure off the area.  On the left foot the brace appears to be fitting well and I also had a Rick lying down the area of the fifth metatarsal base to take more pressure off that area.  There is no skin break on the left foot however it is pre-ulcerative.  Need to keep a close eye on this.  Discussed her possible fifth metatarsal head excision should this ever breaking to a wound.  I will see her back in 1 week to check the area and make sure the brace is not to be modified.  We discussed the break-in for the brace.  Also her socks are very wet today and this is contributing to the friction.  On her changes socks throughout the day. -Monitor for any clinical signs or symptoms of infection and directed to call the office immediately should any occur or go to the ER. -Follow-up in 1 week or sooner if needed.  Call any questions or concerns.  She agrees with this plan.  Trula Slade DPM  -Patient encouraged to call the office with any questions, concerns, change in symptoms.

## 2017-07-17 ENCOUNTER — Other Ambulatory Visit: Payer: BLUE CROSS/BLUE SHIELD | Admitting: Orthotics

## 2017-07-17 ENCOUNTER — Ambulatory Visit: Payer: BLUE CROSS/BLUE SHIELD | Admitting: Podiatry

## 2017-07-17 DIAGNOSIS — L539 Erythematous condition, unspecified: Secondary | ICD-10-CM

## 2017-07-17 DIAGNOSIS — E1149 Type 2 diabetes mellitus with other diabetic neurological complication: Secondary | ICD-10-CM

## 2017-07-17 DIAGNOSIS — M216X2 Other acquired deformities of left foot: Secondary | ICD-10-CM

## 2017-07-22 NOTE — Progress Notes (Signed)
Subjective: Olivia Romero presents the office today for follow-up evaluation of pre-ulcerative area of the left foot.  She says the right side is doing very well she has not had any skin breakdown.  She states that the area she points to the fifth metatarsal base and left foot is looking much better but she still concerned of the fifth metatarsal head of the left foot.  She denies any skin breakdown or any swelling or any new areas of redness except for this been irritated submetatarsal 5 in the fifth metatarsal base.  She states the brace does help when she is been walking better with the brace in the new shoes.  She is been slowly trying to use of the brace.  She did not do physical therapy due to cost.  Denies any systemic complaints such as fevers, chills, nausea, vomiting. No acute changes since last appointment, and no other complaints at this time.   Objective: AAO x3, NAD DP/PT pulses palpable bilaterally, CRT less than 3 seconds There is still tenderness upon the left ankle but since being in the brace I have actually started to notice an improvement.  I am still able to get her ankle into a rectus position manually.  There is areas of erythema, pre-ulcerative areas left foot fifth metatarsal base as well as submetatarsal 5.  Area of erythema to the fifth metatarsal base is actually much improved compared to when I last saw her.  He will very concerned about the submetatarsal 5 area.  There is no definitive skin breakdown identified at this time however it still remains pre-ulcerative.  There is no areas of fluctuation or crepitation.  No ascending cellulitis.  There is prominent metatarsal head plantarly. No skin breakdown or significant hyperkeratotic tissue to the right foot. No open lesions or pre-ulcerative lesions.  No pain with calf compression, swelling, warmth, erythema  Assessment: Pre-ulcerative areas left foot.  Plan: -All treatment options discussed with the patient including all  alternatives, risks, complications.  -I had with evaluate her today he did further modify the brace to help take pressure off the fifth metatarsal head.  We will continue to watch this area very closely.  Discussed with her that if the area does breakdown discussed the fifth metatarsal head excision.  She wishes to hold off any surgical intervention if possible and I agree with her at this point if the area were to open up and callus and wound I think that this would be the best option for her.  Continue to slowly get used to the brace and the new shoes.  I will see her back as scheduled in about 1 week or sooner if any issues are to arise.  Discussed daily foot inspection.  Trula Slade DPM

## 2017-07-23 ENCOUNTER — Encounter: Payer: Self-pay | Admitting: Family Medicine

## 2017-07-26 ENCOUNTER — Other Ambulatory Visit: Payer: BLUE CROSS/BLUE SHIELD | Admitting: Orthotics

## 2017-07-27 ENCOUNTER — Ambulatory Visit: Payer: BLUE CROSS/BLUE SHIELD | Admitting: Podiatry

## 2017-07-27 ENCOUNTER — Encounter: Payer: Self-pay | Admitting: Podiatry

## 2017-07-27 ENCOUNTER — Other Ambulatory Visit: Payer: BLUE CROSS/BLUE SHIELD | Admitting: Orthotics

## 2017-07-27 DIAGNOSIS — L539 Erythematous condition, unspecified: Secondary | ICD-10-CM

## 2017-07-27 DIAGNOSIS — E1149 Type 2 diabetes mellitus with other diabetic neurological complication: Secondary | ICD-10-CM

## 2017-07-30 NOTE — Progress Notes (Signed)
Subjective: Olivia Romero presents the office today for follow-up evaluation of pre-ulcerative areas the left foot.  She states that since last appointment he is doing much better and the redness is much improved and almost completely resolved.  She denies any open sores.  She does have some swelling to the left foot and ankle today.  She states that she is no longer taking her fluid pills and when she stopped she noticed increased swelling.  This is been an ongoing issue.  She states that she is doing very well this point and she is happy that the areas are healing. Denies any systemic complaints such as fevers, chills, nausea, vomiting. No acute changes since last appointment, and no other complaints at this time.   Objective: AAO x3, NAD DP/PT pulses palpable bilaterally, CRT less than 3 seconds On the left foot submetatarsal 5 as well as the fifth metatarsal base there is no significant erythema and overall the skin appears to be much improved.  There is no open lesions identified.  Cavus foot type is present but unable to reduce into a rectus position.  There is no other open lesions or pre-ulcerative lesions identified bilaterally. No pain with calf compression, swelling, warmth, erythema  Assessment: Improving pre-ulcerative lesion left foot  Plan: -All treatment options discussed with the patient including all alternatives, risks, complications.  -There is significant improvement.  We will continue with the brace and appears to be fitting well without any complications.  All her to do some home rehab exercises which we discussed.  Continue shoes at all times. -Discussed daily foot inspection. -Patient encouraged to call the office with any questions, concerns, change in symptoms.   Trula Slade DPM

## 2017-08-24 ENCOUNTER — Ambulatory Visit: Payer: BLUE CROSS/BLUE SHIELD | Admitting: Podiatry

## 2017-08-24 ENCOUNTER — Encounter: Payer: Self-pay | Admitting: Family Medicine

## 2017-08-24 ENCOUNTER — Encounter: Payer: Self-pay | Admitting: Podiatry

## 2017-08-24 ENCOUNTER — Ambulatory Visit: Payer: BLUE CROSS/BLUE SHIELD | Admitting: Family Medicine

## 2017-08-24 ENCOUNTER — Ambulatory Visit (INDEPENDENT_AMBULATORY_CARE_PROVIDER_SITE_OTHER): Payer: BLUE CROSS/BLUE SHIELD

## 2017-08-24 VITALS — BP 140/70 | HR 73 | Temp 98.5°F | Resp 16 | Ht 60.0 in | Wt 198.0 lb

## 2017-08-24 DIAGNOSIS — E1142 Type 2 diabetes mellitus with diabetic polyneuropathy: Secondary | ICD-10-CM

## 2017-08-24 DIAGNOSIS — M779 Enthesopathy, unspecified: Secondary | ICD-10-CM

## 2017-08-24 DIAGNOSIS — M216X9 Other acquired deformities of unspecified foot: Secondary | ICD-10-CM

## 2017-08-24 DIAGNOSIS — T23222D Burn of second degree of single left finger (nail) except thumb, subsequent encounter: Secondary | ICD-10-CM

## 2017-08-24 MED ORDER — SILVER SULFADIAZINE 1 % EX CREA: 1.0000 "application " | TOPICAL_CREAM | Freq: Every day | CUTANEOUS | 0 refills | Status: DC
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
## 2017-08-24 NOTE — Patient Instructions (Addendum)
Zinc Sulfate 220mg  daily for 14 days. Vitamin C 500-1000mg  daily for 14 days. Diabetes Mellitus and Sick Day Management Blood sugar (glucose) can be difficult to control when you are sick. Common illnesses that can cause problems for people with diabetes (diabetes mellitus) include colds, fever, flu (influenza), nausea, vomiting, and diarrhea. These illnesses can cause stress and loss of body fluids (dehydration), and those issues can cause blood glucose levels to increase. Because of this, it is very important to take your insulin and diabetes medicines and eat some form of carbohydrate when you are sick. You should make a plan for days when you are sick (sick day plan) as part of your diabetes management plan. You and your health care provider should make this plan in advance. The following guidelines are intended to help you manage an illness that lasts for about 24 hours or less. Your health care provider may also give you more specific instructions. What do I need to do to manage my blood glucose?  Check your blood glucose every 2-4 hours, or as often as told by your health care provider.  Know your sick day treatment goals. Your target blood glucose levels may be different when you are sick.  If you use insulin, take your usual dose. ? If your blood glucose continues to be too high, you may need to take an additional insulin dose as told by your health care provider.  If you use oral diabetes medicine, you may need to stop taking it if you are not able to eat or drink normally. Ask your health care provider about whether you need to stop taking these medicines while you are sick.  If you use injectable hormone medicines other than insulin to control your diabetes, ask your health care provider about whether you need to stop taking these medicines while you are sick. What else can I do to manage my diabetes when I am sick? Check your ketones  If you have type 1 diabetes, check your urine  ketones every 4 hours.  If you have type 2 diabetes, check your urine ketones as often as told by your health care provider. Drink fluids  Drink enough fluid to keep your urine clear or pale yellow. This is especially important if you have a fever, vomiting, or diarrhea. Those symptoms can lead to dehydration.  Follow any instructions from your health care provider about beverages to avoid. ? Do not drink alcohol, caffeine, or drinks that contain a lot of sugar. Take medicines as directed  Take-over-the-counter and prescription medicines only as told by your health care provider.  Check medicine labels for added sugars. Some medicines may contain sugar or types of sugars that can raise your blood glucose level. What foods can I eat when I am sick? You need to eat some form of carbohydrates when you are sick. You should eat 45-50 grams (45-50 g) of carbohydrates every 3-4 hours until you feel better. All of the food choices below contain about 15 g of carbohydrates. Plan ahead and keep some of these foods around so you have them if you get sick.  4-6 oz (120-177 mL) carbonated beverage that contains sugar, such as regular (not diet) soda. You may be able to drink carbonated beverages more easily if you open the beverage and let it sit at room temperature for a few minutes before drinking.   of a twin frozen ice pop.  4 oz (120 g) regular gelatin.  4 oz (120 mL) fruit juice.  4 oz (120 g) ice cream or frozen yogurt.  2 oz (60 g) sherbet.  8 oz (240 mL) clear broth or soup.  4 oz (120 g) regular custard.  4 oz (120 g) regular pudding.  8 oz (240 g) plain yogurt.  1 slice bread or toast.  6 saltine crackers.  5 vanilla wafers.  Questions to ask your health care provider Consider asking the following questions so you know what to do on days when you are sick:  Should I adjust my diabetes medicines?  How often do I need to check my blood glucose?  What supplies do I need  to manage my diabetes at home when I am sick?  What number can I call if I have questions?  What foods and drinks should I avoid?  Contact a health care provider if:  You develop symptoms of diabetic ketoacidosis, such as: ? Fatigue. ? Weight loss. ? Excessive thirst. ? Light-headedness. ? Fruity or sweet-smelling breath. ? Excessive urination. ? Vision changes. ? Confusion or irritability. ? Nausea. ? Vomiting. ? Rapid breathing. ? Pain in the abdomen. ? Feeling flushed.  You are unable to drink fluids without vomiting.  You have any of the following for more than 6 hours: ? Nausea. ? Vomiting. ? Diarrhea.  Your blood glucose is at or above 240 mg/dL (13.3 mmol/L), even after you take an additional insulin dose.  You have a change in how you think, feel, or act (mental status).  You develop another serious illness.  You have been sick or have had a fever for 2 days or longer and you are not getting better. Get help right away if:  Your blood glucose is lower than 54 mg/dL (3.0 mmol/L).  You have difficulty breathing.  You have moderate or high ketone levels in your urine.  You used emergency glucagon to treat low blood glucose. Summary  Blood sugar (glucose) can be difficult to control when you are sick. Common illnesses that can cause problems for people with diabetes (diabetes mellitus) include colds, fever, flu (influenza), nausea, vomiting, and diarrhea.  Illnesses can cause stress and loss of body fluids (dehydration), and those issues can cause blood glucose levels to increase.  Make a plan for days when you are sick (sick day plan) as part of your diabetes management plan. You and your health care provider should make this plan in advance.  It is very important to take your insulin and diabetes medicines and to eat some form of carbohydrate when you are sick.  Contact your health care provider if have problems managing your blood glucose levels when you  are sick, or if you have been sick or had a fever for 2 days or longer and are not getting better. This information is not intended to replace advice given to you by your health care provider. Make sure you discuss any questions you have with your health care provider. Document Released: 05/25/2003 Document Revised: 02/18/2016 Document Reviewed: 02/18/2016 Elsevier Interactive Patient Education  2018 [place], Adult A burn is an injury to the skin or the tissues under the skin. There are three types of burns:  First degree. These burns may cause the skin to be red and a bit swollen.  Second degree. These burns are very painful and cause the skin to be very red. The skin may also leak fluid, look shiny, and start to have blisters.  Third degree. These burns cause permanent damage. They turn the skin  white or black and make it look charred, dry, and leathery.  Taking care of your burn properly can help to prevent pain and infection. It can also help the burn to heal more quickly. How is this treated? Right after a burn:  Rinse or soak the burn under cool water. Do this for several minutes. Do not put ice on your burn. That can cause more damage.  Lightly cover the burn with a clean (sterile) cloth (dressing). Burn care  Raise (elevate) the injured area above the level of your heart while sitting or lying down.  Follow instructions from your doctor about: ? How to clean and take care of the burn. ? When to change and remove the cloth.  Check your burn every day for signs of infection. Check for: ? More redness, swelling, or pain. ? Warmth. ? Pus or a bad smell. Medicine   Take over-the-counter and prescription medicines only as told by your doctor.  If you were prescribed antibiotic medicine, take or apply it as told by your doctor. Do not stop using the antibiotic even if your condition improves. General instructions  To prevent infection: ? Do not put butter,  oil, or other home treatments on the burn. ? Do not scratch or pick at the burn. ? Do not break any blisters. ? Do not peel skin.  Do not rub your burn, even when you are cleaning it.  Protect your burn from the sun. Contact a doctor if:  Your condition does not get better.  Your condition gets worse.  You have a fever.  Your burn looks different or starts to have black or red spots on it.  Your burn feels warm to the touch.  Your pain is not controlled with medicine. Get help right away if:  You have redness, swelling, or pain at the site of the burn.  You have fluid, blood, or pus coming from your burn.  You have red streaks near the burn.  You have very bad pain. This information is not intended to replace advice given to you by your health care provider. Make sure you discuss any questions you have with your health care provider. Document Released: 02/29/2008 Document Revised: 07/08/2016 Document Reviewed: 11/09/2015 Elsevier Interactive Patient Education  [employer].

## 2017-08-27 NOTE — Progress Notes (Signed)
Subjective: 62 year old female presents the office today for follow-up evaluation of left foot pain.  She has a small callus area that is coming.  Loosen the skin is hanging on that she is to have trimmed as well.  She states that since wearing the brace the erythema has greatly improved and she has not noticed any open sores.  She gets pain to the ball of her foot at times in the left side she denies any recent injury or trauma or any falls.  She states the brace is fitting well.  Denies any systemic complaints such as fevers, chills, nausea, vomiting. No acute changes since last appointment, and no other complaints at this time.   Objective: AAO x3, NAD DP/PT pulses palpable bilaterally, CRT less than 3 seconds Protective sensation decreased with Simms Weinstein monofilament There is loose hanging skin in the callus left foot submetatarsal 5.  There is no underlying ulceration, drainage or any signs of infection noted. Cavus deformity is present in the left side but is reducible to a rectus position.  Mild discomfort of the metatarsal phalangeal joint of 2, 3, 4 there is no specific area pinpoint bony tenderness.  There is mild chronic edema to the left leg and she takes fluid pills for this but there is no significant increase.  There is no erythema or increase in warmth. No open lesions or pre-ulcerative lesions.  Prominent metatarsal heads plantarly with atrophy of the fat pad. The toe was sitting in a rectus position of the fifth toe on the left foot today. No pain with calf compression, swelling, warmth, erythema  Assessment: Callus formation left foot with capsulitis  Plan: -All treatment options discussed with the patient including all alternatives, risks, complications.  -X-rays were obtained and reviewed.  No evidence of acute fracture or stress fracture was identified today. -We did discussion regards to the brace as well as starting physical therapy for this.  She states that she had  to hold off on physical therapy due to cost that she can consider going back at this point.  Continue the pain that she is getting is likely result of compensation as she is trying to walk more normal in the brace. -I will see her back next couple weeks.  If symptoms continue we will repeat x-rays to rule out stress fracture.  Trula Slade DPM

## 2017-08-28 ENCOUNTER — Encounter: Payer: Self-pay | Admitting: Family Medicine

## 2017-08-28 ENCOUNTER — Ambulatory Visit: Payer: BLUE CROSS/BLUE SHIELD | Admitting: Family Medicine

## 2017-08-28 VITALS — BP 130/80 | HR 82 | Temp 98.2°F | Resp 16 | Ht 60.0 in | Wt 198.0 lb

## 2017-08-28 DIAGNOSIS — T23222D Burn of second degree of single left finger (nail) except thumb, subsequent encounter: Secondary | ICD-10-CM

## 2017-08-28 NOTE — Patient Instructions (Signed)
Burn Care, Adult A burn is an injury to the skin or the tissues under the skin. There are three types of burns:  First degree. These burns may cause the skin to be red and a bit swollen.  Second degree. These burns are very painful and cause the skin to be very red. The skin may also leak fluid, look shiny, and start to have blisters.  Third degree. These burns cause permanent damage. They turn the skin white or black and make it look charred, dry, and leathery.  Taking care of your burn properly can help to prevent pain and infection. It can also help the burn to heal more quickly. How is this treated? Right after a burn:  Rinse or soak the burn under cool water. Do this for several minutes. Do not put ice on your burn. That can cause more damage.  Lightly cover the burn with a clean (sterile) cloth (dressing). Burn care  Raise (elevate) the injured area above the level of your heart while sitting or lying down.  Follow instructions from your doctor about: ? How to clean and take care of the burn. ? When to change and remove the cloth.  Check your burn every day for signs of infection. Check for: ? More redness, swelling, or pain. ? Warmth. ? Pus or a bad smell. Medicine   Take over-the-counter and prescription medicines only as told by your doctor.  If you were prescribed antibiotic medicine, take or apply it as told by your doctor. Do not stop using the antibiotic even if your condition improves. General instructions  To prevent infection: ? Do not put butter, oil, or other home treatments on the burn. ? Do not scratch or pick at the burn. ? Do not break any blisters. ? Do not peel skin.  Do not rub your burn, even when you are cleaning it.  Protect your burn from the sun. Contact a doctor if:  Your condition does not get better.  Your condition gets worse.  You have a fever.  Your burn looks different or starts to have black or red spots on it.  Your burn  feels warm to the touch.  Your pain is not controlled with medicine. Get help right away if:  You have redness, swelling, or pain at the site of the burn.  You have fluid, blood, or pus coming from your burn.  You have red streaks near the burn.  You have very bad pain. This information is not intended to replace advice given to you by your health care provider. Make sure you discuss any questions you have with your health care provider. Document Released: 02/29/2008 Document Revised: 07/08/2016 Document Reviewed: 11/09/2015 Elsevier Interactive Patient Education  2018 [employer]  

## 2017-08-28 NOTE — Progress Notes (Signed)
Name: Olivia Romero   MRN: [id number]    DOB: [birth year]   Date:08/28/2017       Progress Note  Subjective  Chief Complaint  Chief Complaint  Patient presents with  . Follow-up    4 day recheck finder burn    HPI  Pt presents for wound check - she has been using silvadene and soap and water, leaving open to air when able to.  Denies pain, reports great improvement in the wound healing since last visit.  She denies drainage, bleeding, swelling, or worsening redness.  She has been taking vitamin C as suggested to improve wound healing.  Patient Active Problem List   Diagnosis Date Noted  . Skin ulcer of right ankle, limited to breakdown of skin ([place]) 12/31/2016  . Diabetic foot ulcer ([place]) 12/14/2016  . Diabetic peripheral neuropathy associated with type 2 diabetes mellitus ([place]) 12/13/2015  . Absent peripheral pulse 12/13/2015  . Lumbar stenosis with neurogenic claudication 09/02/2015  . Spinal stenosis 08/11/2015  . 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 ([place]) 07/13/2015  . Chronic constipation 07/13/2015  . OSA (obstructive sleep apnea) 06/25/2015  . Diabetic gastroparesis ([place]) 04/23/2015  . 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/23/2015  . Hypertriglyceridemia 04/23/2015  . Statin intolerance 04/23/2015  . Asthma, mild intermittent 02/01/2015  . Type 2 diabetes mellitus with diabetic nephropathy ([place]) 12/31/2014  . Esophagitis 12/31/2014  . Dysphagia 12/31/2014  . Sinus tachycardia 12/03/2014  . Bilateral carpal tunnel syndrome 12/01/2014  . DM neuropathy, type II diabetes mellitus ([place]) 12/01/2014  . Benign neoplasm of colon 11/30/2012  . Ischemic colitis ([place]) 11/30/2012  . Diverticulosis of colon (without mention of hemorrhage) 11/30/2012  . Essential hypertension, benign 11/28/2012  . Malnutrition of moderate degree ([place]) 11/28/2012    Social History   Tobacco Use  . Smoking status: Never Smoker  . Smokeless tobacco: Never Used   Substance Use Topics  . Alcohol use: No    Alcohol/week: 0.0 oz    Comment: wine every 6-8 months     Current Outpatient Medications:  .  acetaminophen (TYLENOL) 500 MG tablet, Take 1,000 mg by mouth every 6 (six) hours as needed for mild pain. , Disp: , Rfl:  .  albuterol (PROVENTIL HFA;VENTOLIN HFA) 108 (90 Base) MCG/ACT inhaler, Inhale 2 puffs into the lungs every 6 (six) hours as needed for wheezing or shortness of breath., Disp: 1 Inhaler, Rfl: 3 .  aspirin 81 MG tablet, Take 1 tablet (81 mg total) by mouth daily., Disp: 30 tablet, Rfl: 0 .  cyclobenzaprine (FLEXERIL) 10 MG tablet, cyclobenzaprine 10 mg tablet  Take 1 tablet qhs by oral route prn spasm., Disp: , Rfl:  .  DULoxetine (CYMBALTA) 60 MG capsule, Take 60 mg by mouth at bedtime., Disp: , Rfl:  .  fenofibrate (TRICOR) 145 MG tablet, Take 145 mg by mouth daily., Disp: , Rfl: 10 .  gabapentin (NEURONTIN) 400 MG capsule, Take 400 mg by mouth 6 (six) times daily. Reported on 12/13/2015, Disp: , Rfl:  .  glimepiride (AMARYL) 2 MG tablet, Take 2 mg by mouth 2 (two) times daily. , Disp: , Rfl:  .  ibuprofen (ADVIL,MOTRIN) 200 MG tablet, Take 400 mg by mouth every 6 (six) hours as needed for mild pain., Disp: , Rfl:  .  Icosapent Ethyl (VASCEPA) 1 g CAPS, Take 2,000 mg by mouth 2 (two) times daily., Disp: 360 capsule, Rfl: 1 .  Insulin Degludec 200 UNIT/ML SOPN, Take 16 units subcutaneously once daily. Take  at bedtime., Disp: , Rfl:  .  lisinopril (PRINIVIL,ZESTRIL) 20 MG tablet, Take 1 tablet (20 mg total) by mouth daily., Disp: 90 tablet, Rfl: 0 .  metFORMIN (GLUCOPHAGE) 500 MG tablet, Take 2 tablets (1,000 mg total) by mouth 2 (two) times daily with a meal., Disp: 120 tablet, Rfl: 3 .  Multiple Vitamin (MULTIVITAMIN WITH MINERALS) TABS, Take 1 tablet by mouth daily. Reported on 07/21/2015, Disp: , Rfl:  .  silver sulfADIAZINE (SILVADENE) 1 % cream, Apply 1 application topically daily., Disp: 50 g, Rfl: 0 .  tobramycin-dexamethasone  (TOBRADEX) ophthalmic solution, tobramycin 0.3 %-dexamethasone 0.1 % eye drops,suspension, Disp: , Rfl:  .  torsemide (DEMADEX) 20 MG tablet, Take 1 tablet (20 mg total) by mouth every other day., Disp: 15 tablet, Rfl: 2 .  cephALEXin (KEFLEX) 500 MG capsule, Take 1 capsule (500 mg total) by mouth 3 (three) times daily. (Patient not taking: Reported on 08/28/2017), Disp: 30 capsule, Rfl: 0 .  HYDROcodone-acetaminophen (NORCO/VICODIN) 5-325 MG tablet, Take 1 tablet by mouth every 6 (six) hours as needed. (Patient not taking: Reported on 08/24/2017), Disp: 20 tablet, Rfl: 0 .  promethazine (PHENERGAN) 25 MG tablet, Take 1 tablet (25 mg total) by mouth every 8 (eight) hours as needed for nausea or vomiting. (Patient not taking: Reported on 08/24/2017), Disp: 20 tablet, Rfl: 0  Allergies  Allergen Reactions  . Eggs Or Egg-Derived Products Other (See Comments)    Angioedema  . Atorvastatin Other (See Comments)    Liver toxicity  . Flu Virus Vaccine Other (See Comments)  . Latex Itching  . Pravastatin Itching and Rash    ROS  Constitutional: Negative for fever or weight change.  Respiratory: Negative for cough and shortness of breath.   Cardiovascular: Negative for chest pain or palpitations.  Gastrointestinal: Negative for abdominal pain, no bowel changes.  Musculoskeletal: Negative for gait problem or joint swelling.  Skin: Negative for rash.  Neurological: Negative for dizziness or headache.  No other specific complaints in a complete review of systems (except as listed in HPI above).  Objective  Vitals:   08/28/17 0901  BP: 130/80  Pulse: 82  Resp: 16  Temp: 98.2 F (36.8 C)  TempSrc: Oral  SpO2: 97%  Weight: 198 lb (89.8 kg)  Height: 5' (1.524 m)   Body mass index is 38.67 kg/m.  Nursing Note and Vital Signs reviewed.  Physical Exam  Constitutional: Patient appears well-developed and well-nourished. Obese. No distress.  HEENT: head atraumatic,  normocephalic Cardiovascular: Normal rate, regular rhythm, S1/S2 present.  No murmur or rub heard. No BLE edema. Pulmonary/Chest: Effort normal and breath sounds clear. No respiratory distress or retractions. Psychiatric: Patient has a normal mood and affect. behavior is normal. Judgment and thought content normal. Skin: Oblong shaped wound to the LEFT middle dorsal finger wound bed is pink and appears to be healing appropriately, small amount of erythema surrounding the wound with no streaking or edema.  No results found for this or any previous visit (from the past 72 hour(s)).  Assessment & Plan  1. Partial thickness burn of finger of left hand, subsequent encounter Continue Silvadene and current wound management regimen.  Follow up as needed.

## 2017-09-07 ENCOUNTER — Encounter: Payer: Self-pay | Admitting: Podiatry

## 2017-09-07 ENCOUNTER — Ambulatory Visit: Payer: BLUE CROSS/BLUE SHIELD | Admitting: Podiatry

## 2017-09-07 VITALS — BP 150/83 | HR 80 | Resp 16

## 2017-09-07 DIAGNOSIS — L539 Erythematous condition, unspecified: Secondary | ICD-10-CM

## 2017-09-07 DIAGNOSIS — M216X9 Other acquired deformities of unspecified foot: Secondary | ICD-10-CM

## 2017-09-07 DIAGNOSIS — E1149 Type 2 diabetes mellitus with other diabetic neurological complication: Secondary | ICD-10-CM

## 2017-09-07 DIAGNOSIS — R609 Edema, unspecified: Secondary | ICD-10-CM

## 2017-09-09 NOTE — Progress Notes (Signed)
Subjective: Olivia Romero presents the office today for follow-up evaluation of left foot pain.  She states that she has had no pain to her foot since she went to the boot and she is actually try to wear chronic and she had no pain but due to swelling she could not wear it.  She still states that there are 2 areas of red spots on the outside aspect the left foot she denies any skin breakdown.  She has had chronic swelling to the foot and she does take a water pill intermittently but not on a continual basis. Denies any systemic complaints such as fevers, chills, nausea, vomiting. No acute changes since last appointment, and no other complaints at this time.   Objective: AAO x3, NAD DP/PT pulses palpable bilaterally, CRT less than 3 seconds Ankle varus is present on the left side however it is somewhat reducible.  Small erythematous areas present on the fifth metatarsal base as well as the fifth metatarsal head but there is no increase in warmth and there is no skin breakdown.  He does appear to be improving compared to what it has been previously.  There is chronic swelling to the left ankle and leg and foot.  There is no other areas of erythema and there is no other areas of skin breakdown.  There is no tenderness palpation to her foot. No open lesions or pre-ulcerative lesions.  No pain with calf compression, swelling, warmth, erythema  Assessment: 62 year old female with swelling left side with ankle varus  Plan: -All treatment options discussed with the patient including all alternatives, risks, complications.  -At this point her pain is resolved.  I would like for her to get back into her brace however due to swelling of concerned about putting her into the brace and causing skin irritation other areas.  In the [facility] boot was applied today precautions were discussed on when to remove it as well as if there is any issues in regards to increased swelling, pain, discoloration.  Also I would like for her to  take her water pill as directed.  Once swelling improves I would like her to try to go back into the brace.  She had no pain today on exam so I held off on further x-rays. -Patient encouraged to call the office with any questions, concerns, change in symptoms.   Trula Slade DPM

## 2017-09-17 ENCOUNTER — Encounter: Payer: Self-pay | Admitting: Podiatry

## 2017-09-17 ENCOUNTER — Ambulatory Visit (INDEPENDENT_AMBULATORY_CARE_PROVIDER_SITE_OTHER): Payer: BLUE CROSS/BLUE SHIELD | Admitting: Podiatry

## 2017-09-17 DIAGNOSIS — E1149 Type 2 diabetes mellitus with other diabetic neurological complication: Secondary | ICD-10-CM

## 2017-09-17 DIAGNOSIS — L539 Erythematous condition, unspecified: Secondary | ICD-10-CM

## 2017-09-17 DIAGNOSIS — M216X9 Other acquired deformities of unspecified foot: Secondary | ICD-10-CM | POA: Diagnosis not present

## 2017-09-18 NOTE — Progress Notes (Signed)
Subjective: 62 year old female presents the office today for follow-up evaluation of swelling as well as redness and cavus deformity to her left ankle.  She states that she is continuing the boot.  She states that she has done quite a bit of walking.  She was having to do jury duty and doing a lot of walking.  She states that wearing the Ace bandage does help and the Unna boot did help quite a bit.  She also states that she is having some swelling to her left side today.  She has not taken her fluid pills the last 2 days.  She has not tried wearing the brace but she is hopefully going to try this today.  She has no pain in the foot. Denies any systemic complaints such as fevers, chills, nausea, vomiting. No acute changes since last appointment, and no other complaints at this time.   Objective: AAO x3, NAD DP/PT pulses palpable bilaterally, CRT less than 3 seconds Varus deformity is present the ankle.  On the left foot submetatarsal 5 as well as the fifth metatarsal base plantarly is localized areas of erythema however there is no skin breakdown the area is pre-ulcerative.  There is still swelling present however mildly improved compared to last appointment there is no other areas of erythema or increase in warmth.  Unable to reduce the ankle to almost neutral.  No area of tenderness identified to bilateral lower extremities.  No open lesions or pre-ulcerative lesions.  No pain with calf compression, swelling, warmth, erythema  Assessment: Pressure lesions left foot due to deformity, swelling  Plan: -All treatment options discussed with the patient including all alternatives, risks, complications.  -Overall she is mildly improved.  We discussed she can continue the Ace bandage to help with swelling I also recommended her to take her diuretics as prescribed.  She can start to try to get back in the brace today but I discussed with her to gradually do this and only very small amount each day and also  monitor her foot closely for any signs of increased pressure irritation.  There is any issues she is to call the office.  We discussed physical therapy again today and she is willing to do this, and her to try to get into the brace for starting this. -Patient encouraged to call the office with any questions, concerns, change in symptoms.   Trula Slade DPM

## 2017-09-27 ENCOUNTER — Ambulatory Visit: Payer: BLUE CROSS/BLUE SHIELD | Admitting: Family Medicine

## 2017-10-04 ENCOUNTER — Telehealth: Payer: Self-pay | Admitting: *Deleted

## 2017-10-04 NOTE — Telephone Encounter (Signed)
What do they need orders for? If it is for a wound then they don't need to come out. If it is for PT then we can do that but not sure what they need.

## 2017-10-04 NOTE — Telephone Encounter (Signed)
Olivia Romero - [facility] states pt's orders are outdated, request current orders.

## 2017-10-05 NOTE — Telephone Encounter (Signed)
I spoke with Devin - [facility] and she states they were just trying to get a signature on a old order. I told Devin to fax to [phone] and I would review and have Dr. Jacqualyn Posey sign.

## 2017-10-08 ENCOUNTER — Ambulatory Visit: Payer: BLUE CROSS/BLUE SHIELD | Admitting: Podiatry

## 2017-10-19 ENCOUNTER — Encounter: Payer: Self-pay | Admitting: Podiatry

## 2017-10-19 ENCOUNTER — Ambulatory Visit: Payer: BLUE CROSS/BLUE SHIELD | Admitting: Podiatry

## 2017-10-19 DIAGNOSIS — E1149 Type 2 diabetes mellitus with other diabetic neurological complication: Secondary | ICD-10-CM

## 2017-10-19 DIAGNOSIS — L539 Erythematous condition, unspecified: Secondary | ICD-10-CM

## 2017-10-19 DIAGNOSIS — M21172 Varus deformity, not elsewhere classified, left ankle: Secondary | ICD-10-CM | POA: Diagnosis not present

## 2017-10-22 NOTE — Progress Notes (Signed)
Subjective: Ms. Sessa presents the office today for follow-up evaluation of her left ankle.  She states that her ankles continue to turn inwards and she has been getting swelling to the ankle.  She has not been able to do physical therapy.  She is been wearing the brace and she states that the brace was helping at first however since wearing it her ankles continue to turn she is being pressure to the onset aspect of her foot causing pain.  She denies any open sores. Denies any systemic complaints such as fevers, chills, nausea, vomiting. No acute changes since last appointment, and no other complaints at this time.   Objective: AAO x3, NAD DP/PT pulses palpable bilaterally, CRT less than 3 seconds Varus deformity is present to the left ankle.  There is erythematous skin changes present to the fifth metatarsal head as well as the fifth metatarsal base however this appears to be improved compared to what has been previously but does continue but there is no open lesion identified.  I am still able to put the ankle into a rectus position.  There is swelling to the foot and ankle.  She states the long she takes her fluid pills she does well however otherwise it will start to swell more.  There is no erythema associated with the swelling.  There is no area pinpoint tenderness. No open lesions or pre-ulcerative lesions.  No pain with calf compression, swelling, warmth, erythema  Assessment: Varus deformity left ankle  Plan: -All treatment options discussed with the patient including all alternatives, risks, complications.  -At this point we discussed further treatment options.  I will have her come back in to see Benjie Karvonen or Liliane Channel to see if we can modify the brace however I am also been ordered an MRI of the left ankle to evaluate any ligament or tendon tear which is resulting in the varus deformity.  Long-term we discussed surgical intervention to hopefully help straighten her ankle out.  Also discussed  physical therapy, check on doing home physical therapy for her. -Patient encouraged to call the office with any questions, concerns, change in symptoms.   Trula Slade DPM

## 2017-10-23 ENCOUNTER — Telehealth: Payer: Self-pay | Admitting: *Deleted

## 2017-10-23 DIAGNOSIS — M21172 Varus deformity, not elsewhere classified, left ankle: Secondary | ICD-10-CM

## 2017-10-23 DIAGNOSIS — R2681 Unsteadiness on feet: Secondary | ICD-10-CM

## 2017-10-23 LAB — HEMOGLOBIN A1C: HEMOGLOBIN A1C: 8.2

## 2017-10-23 LAB — MICROALBUMIN, URINE: Microalb, Ur: 34.7

## 2017-10-23 NOTE — Telephone Encounter (Signed)
Orders to [facility] [place] [employer], faxed to [place]. Faxed required form, clinicals and demographics to [facility].

## 2017-10-23 NOTE — Telephone Encounter (Signed)
-----   Message from Trula Slade, DPM sent at 10/22/2017  7:51 PM EDT ----- Can you please order an MRI of the left ankle to rule out tendon, ligament tear given varus deformity to the ankle?   Also can you check to see if home physical therapy be covered for her for gait training?

## 2017-10-24 ENCOUNTER — Ambulatory Visit: Payer: Self-pay | Admitting: *Deleted

## 2017-10-24 NOTE — Telephone Encounter (Signed)
No availability at [facility] today. Patient offered an appointment for tomorrow - she prefers to be seen today.She will go to [facility].Patient reports  Some pain in chest and back when she coughs.  Reason for Disposition . [1] Continuous (nonstop) coughing interferes with work or school AND [2] no improvement using cough treatment per Care Advice  Answer Assessment - Initial Assessment Questions 1. ONSET: "When did the cough begin?"        3  Days  Ago   2. SEVERITY: "How bad is the cough today?"           Bad  -   3. RESPIRATORY DISTRESS: "Describe your breathing."           Slight  Difficulty     4. FEVER: "Do you have a fever?" If so, ask: "What is your temperature, how was it measured, and when did it start?"         Sweated last  Night  -  Otherwise  No  Signs   5. SPUTUM: "Describe the color of your sputum" (clear, white, yellow, green)        Dime  Sized     Thick mucous  Creamy off white  Color  Productive   6. HEMOPTYSIS: "Are you coughing up any blood?" If so ask: "How much?" (flecks, streaks, tablespoons, etc.)     No  7. CARDIAC HISTORY: "Do you have any history of heart disease?" (e.g., heart attack, congestive heart failure)        No 8. LUNG HISTORY: "Do you have any history of lung disease?"  (e.g., pulmonary embolus, asthma, emphysema)      Asthma  -  Seasonal allergies   9. PE RISK FACTORS: "Do you have a history of blood clots?" (or: recent major surgery, recent prolonged travel, bedridden )        No   10. OTHER SYMPTOMS: "Do you have any other symptoms?" (e.g., runny nose, wheezing, chest pain)        SOME WHEEZING  AT TIMES  GETTING  MORE INTENSE RECENTLY   11. PREGNANCY: "Is there any chance you are pregnant?" "When was your last menstrual period?"       N/A  12. TRAVEL: "Have you traveled out of the country in the last month?" (e.g., travel history, exposures)       no  Protocols used: [place]

## 2017-10-31 ENCOUNTER — Ambulatory Visit: Payer: BLUE CROSS/BLUE SHIELD | Admitting: Nurse Practitioner

## 2017-10-31 ENCOUNTER — Ambulatory Visit
Admission: RE | Admit: 2017-10-31 | Discharge: 2017-10-31 | Disposition: A | Discharge status: Home / Self Care | Payer: BLUE CROSS/BLUE SHIELD | Source: Ambulatory Visit | Attending: Nurse Practitioner | Admitting: Nurse Practitioner

## 2017-10-31 ENCOUNTER — Encounter: Payer: Self-pay | Admitting: Nurse Practitioner

## 2017-10-31 VITALS — BP 120/76 | HR 100 | Temp 98.3°F | Resp 18 | Ht 60.0 in | Wt 201.5 lb

## 2017-10-31 DIAGNOSIS — R0989 Other specified symptoms and signs involving the circulatory and respiratory systems: Secondary | ICD-10-CM | POA: Insufficient documentation

## 2017-10-31 DIAGNOSIS — R062 Wheezing: Secondary | ICD-10-CM | POA: Diagnosis not present

## 2017-10-31 DIAGNOSIS — R05 Cough: Secondary | ICD-10-CM | POA: Diagnosis not present

## 2017-10-31 DIAGNOSIS — J4520 Mild intermittent asthma, uncomplicated: Secondary | ICD-10-CM | POA: Diagnosis not present

## 2017-10-31 DIAGNOSIS — R059 Cough, unspecified: Secondary | ICD-10-CM

## 2017-10-31 MED ORDER — ALBUTEROL SULFATE (2.5 MG/3ML) 0.083% IN NEBU: 2.5000 mg | INHALATION_SOLUTION | Freq: Once | RESPIRATORY_TRACT | Status: DC

## 2017-10-31 MED ORDER — DOXYCYCLINE HYCLATE 100 MG PO TABS: 100.0000 mg | ORAL_TABLET | Freq: Two times a day (BID) | ORAL | 0 refills | Status: DC

## 2017-10-31 MED ORDER — ALBUTEROL SULFATE HFA 108 (90 BASE) MCG/ACT IN AERS: 2.0000 | INHALATION_SPRAY | Freq: Four times a day (QID) | RESPIRATORY_TRACT | 3 refills | Status: DC | PRN

## 2017-10-31 NOTE — Progress Notes (Addendum)
Name: Olivia Romero   MRN: [id number]    DOB: [birth year]   Date:10/31/2017       Progress Note  Subjective  Chief Complaint  Chief Complaint  Patient presents with  . Follow-up    bronchitis, seen at [facility]. Still have cough, congestion, SOB    HPI  Patient was seen at [facility] clinic on 5/22 coughing up white mucus, chest and back pain. Was dx with sinusitis and bronchitis rx. Hycodan syrup, tessalon pearls, prednisone, zpack and xopenex neb treatment in office. Patient additionally has been taking musinex and chest xray was ordered- will request results.   Patient states on the 19th started having cough, shob, fatigue, nasal congestion, mild left ear pain, sts fetl feverish early on last week. Denies sore throat, headaches. Patient has asthma states intermittent- usually just triggered by scents but recently taking 2 puffs every 4-6 hours as instructed.    Patient Active Problem List   Diagnosis Date Noted  . Skin ulcer of right ankle, limited to breakdown of skin ([place]) 12/31/2016  . Diabetic foot ulcer ([place]) 12/14/2016  . Diabetic peripheral neuropathy associated with type 2 diabetes mellitus ([place]) 12/13/2015  . Absent peripheral pulse 12/13/2015  . Lumbar stenosis with neurogenic claudication 09/02/2015  . 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 Chronic constipation 07/13/2015  . OSA (obstructive sleep apnea) 06/25/2015  . 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/23/2015  . Hypertriglyceridemia 04/23/2015  . Statin intolerance 04/23/2015  . Asthma, mild intermittent 02/01/2015  . Type 2 diabetes mellitus with diabetic nephropathy ([place]) 12/31/2014  . Esophagitis 12/31/2014  . Dysphagia 12/31/2014  . Sinus tachycardia 12/03/2014  . Bilateral carpal tunnel syndrome 12/01/2014  . Benign neoplasm of colon 11/30/2012  . Ischemic colitis ([place]) 11/30/2012  . Diverticulosis of colon (without mention of hemorrhage)  11/30/2012  . Essential hypertension, benign 11/28/2012  . Malnutrition of moderate degree ([place]) 11/28/2012    Past Medical History:  Diagnosis Date  . Anemia   . Arthritis   . Asthma   . Carpal tunnel syndrome on right   . Colon polyps    adenomatous  . Diabetes mellitus without complication ([place])   . Diverticulosis of colon   . Esophagitis   . GERD (gastroesophageal reflux disease)   . Hemorrhoid    internal  . Hyperlipemia   . Hypertension   . Neuropathy   . Pneumonia   . PONV (postoperative nausea and vomiting)   . Sleep apnea    has had in the past lost 50 pounds and do longer uses cpap  . Stroke-like episode ([place]) 2009   TIA    Past Surgical History:  Procedure Laterality Date  . ABDOMINAL HYSTERECTOMY    . BACK SURGERY  09/02/2015  . CERVICAL FUSION    . CESAREAN SECTION  1986  . COLONOSCOPY N/A 11/30/2012   Procedure: COLONOSCOPY;  Surgeon: Irene Shipper, MD;  Location: [facility] ENDOSCOPY;  Service: Endoscopy;  Laterality: N/A;  . COLONOSCOPY WITH PROPOFOL N/A 01/03/2016   Procedure: COLONOSCOPY WITH PROPOFOL;  Surgeon: Manya Silvas, MD;  Location: [facility] ENDOSCOPY;  Service: Endoscopy;  Laterality: N/A;  . ESOPHAGOGASTRODUODENOSCOPY (EGD) WITH PROPOFOL N/A 01/14/2015   Procedure: ESOPHAGOGASTRODUODENOSCOPY (EGD) WITH PROPOFOL;  Surgeon: Manya Silvas, MD;  Location: [facility] ENDOSCOPY;  Service: Endoscopy;  Laterality: N/A;  . LUMBAR WOUND DEBRIDEMENT N/A 10/01/2015   Procedure: LUMBAR WOUND DEBRIDEMENT;  Surgeon: Ashok Pall, MD;  Location: [facility];  Service: Neurosurgery;  Laterality:  N/A;  LUMBAR WOUND DEBRIDEMENT  . SAVORY DILATION N/A 01/14/2015   Procedure: SAVORY DILATION;  Surgeon: Manya Silvas, MD;  Location: [facility] ENDOSCOPY;  Service: Endoscopy;  Laterality: N/A;  . TONSILLECTOMY      Social History   Tobacco Use  . Smoking status: Never Smoker  . Smokeless tobacco: Never Used  Substance Use Topics  . Alcohol use: No    Alcohol/week: 0.0 oz     Comment: wine every 6-8 months     Current Outpatient Medications:  .  acetaminophen (TYLENOL) 500 MG tablet, Take 1,000 mg by mouth every 6 (six) hours as needed for mild pain. , Disp: , Rfl:  .  albuterol (PROVENTIL HFA;VENTOLIN HFA) 108 (90 Base) MCG/ACT inhaler, Inhale 2 puffs into the lungs every 6 (six) hours as needed for wheezing or shortness of breath., Disp: 1 Inhaler, Rfl: 3 .  aspirin 81 MG tablet, Take 1 tablet (81 mg total) by mouth daily., Disp: 30 tablet, Rfl: 0 .  cyclobenzaprine (FLEXERIL) 10 MG tablet, cyclobenzaprine 10 mg tablet  Take 1 tablet qhs by oral route prn spasm., Disp: , Rfl:  .  DULoxetine (CYMBALTA) 60 MG capsule, Take 60 mg by mouth at bedtime., Disp: , Rfl:  .  fenofibrate (TRICOR) 145 MG tablet, Take 145 mg by mouth daily., Disp: , Rfl: 10 .  gabapentin (NEURONTIN) 400 MG capsule, Take 400 mg by mouth 6 (six) times daily. Reported on 12/13/2015, Disp: , Rfl:  .  glimepiride (AMARYL) 2 MG tablet, Take 2 mg by mouth 2 (two) times daily. , Disp: , Rfl:  .  HYDROcodone-acetaminophen (NORCO/VICODIN) 5-325 MG tablet, Take 1 tablet by mouth every 6 (six) hours as needed., Disp: 20 tablet, Rfl: 0 .  ibuprofen (ADVIL,MOTRIN) 200 MG tablet, Take 400 mg by mouth every 6 (six) hours as needed for mild pain., Disp: , Rfl:  .  Icosapent Ethyl (VASCEPA) 1 g CAPS, Take 2,000 mg by mouth 2 (two) times daily., Disp: 360 capsule, Rfl: 1 .  Insulin Degludec 200 UNIT/ML SOPN, Take 16 units subcutaneously once daily. Take at bedtime., Disp: , Rfl:  .  lisinopril (PRINIVIL,ZESTRIL) 20 MG tablet, Take 1 tablet (20 mg total) by mouth daily., Disp: 90 tablet, Rfl: 0 .  metFORMIN (GLUCOPHAGE) 500 MG tablet, Take 2 tablets (1,000 mg total) by mouth 2 (two) times daily with a meal., Disp: 120 tablet, Rfl: 3 .  Multiple Vitamin (MULTIVITAMIN WITH MINERALS) TABS, Take 1 tablet by mouth daily. Reported on 07/21/2015, Disp: , Rfl:  .  promethazine (PHENERGAN) 25 MG tablet, Take 1 tablet (25 mg  total) by mouth every 8 (eight) hours as needed for nausea or vomiting., Disp: 20 tablet, Rfl: 0 .  silver sulfADIAZINE (SILVADENE) 1 % cream, Apply 1 application topically daily., Disp: 50 g, Rfl: 0 .  tobramycin-dexamethasone (TOBRADEX) ophthalmic solution, tobramycin 0.3 %-dexamethasone 0.1 % eye drops,suspension, Disp: , Rfl:  .  torsemide (DEMADEX) 20 MG tablet, Take 1 tablet (20 mg total) by mouth every other day., Disp: 15 tablet, Rfl: 2 .  amoxicillin (AMOXIL) 500 MG capsule, , Disp: , Rfl: 0  Allergies  Allergen Reactions  . Eggs Or Egg-Derived Products Other (See Comments)    Angioedema  . Atorvastatin Other (See Comments)    Liver toxicity  . Flu Virus Vaccine Other (See Comments)  . Latex Itching  . Pravastatin Itching and Rash    ROS  No other specific complaints in a complete review of systems (except as listed in  HPI above).  Objective  Vitals:   10/31/17 1326  BP: 120/76  Pulse: 100  Resp: 18  Temp: 98.3 F (36.8 C)  TempSrc: Oral  SpO2: 95%  Weight: 201 lb 8 oz (91.4 kg)  Height: 5' (1.524 m)     Body mass index is 39.35 kg/m.  Nursing Note and Vital Signs reviewed.  Physical Exam  Constitutional: Patient appears well-developed and well-nourished. Obese No distress.  HEENT: head atraumatic, normocephalic, pupils equal and reactive to light, TM's without erythema or bulging,  no maxillary or frontal sinus tenderness , neck supple without lymphadenopathy, oropharynx pink and dry without exudate, no nasal discharge Cardiovascular: mildly elevated rate, regular rhythm, S1/S2 present.  No murmur or rub heard.  Pulmonary/Chest: diffuse expiratory wheezing bilaterally throughout with rhonchi at bilateral bases, after neb treatment expiratory wheezing resolved but rhonchi still noticed at bases. No respiratory distress or retractions. Psychiatric: Patient has a normal mood and affect. behavior is normal. Judgment and thought content normal.  No results found  for this or any previous visit (from the past 72 hour(s)).  Assessment & Plan  1. Mild intermittent asthma without complication - albuterol (PROVENTIL HFA;VENTOLIN HFA) 108 (90 Base) MCG/ACT inhaler; Inhale 2 puffs into the lungs every 6 (six) hours as needed for wheezing or shortness of breath.  Dispense: 1 Inhaler; Refill: 3  2. Cough - DG Chest 2 View; Future - doxycycline (VIBRA-TABS) 100 MG tablet; Take 1 tablet (100 mg total) by mouth 2 (two) times daily.  Dispense: 20 tablet; Refill: 0  3. Rhonchi at both lung bases - DG Chest 2 View; Future - doxycycline (VIBRA-TABS) 100 MG tablet; Take 1 tablet (100 mg total) by mouth 2 (two) times daily.  Dispense: 20 tablet; Refill: 0  4. Diffuse wheezing - albuterol (PROVENTIL) (2.5 MG/3ML) 0.083% nebulizer solution 2.5 mg  - Continue taking cough medicine and musinex - Continue taking albuterol inhaler - Start taking antibiotic doxycycline every 12 hours with food on your stomach. Recommend continuing probiotic.  - Get chest xray today. - Drink plenty of water ( goal is 64 ounces a day)  If not resolved in a week we will send you to pulmonology, and if symptoms worsen come back sooner or seek emergency medical treatment.    -Red flags and when to present for emergency care or RTC including fever >101.30F, chest pain, shortness of breath, new/worsening/un-resolving symptoms, reviewed with patient at time of visit. Follow up and care instructions discussed and provided in AVS. -Reviewed Health Maintenance: discussed pneumo vax when feeling better, and eye exam when she can afford.  ---------------------------------------- I have reviewed this encounter including the documentation in this note and/or discussed this patient with the provider, Suezanne Cheshire DNP AGNP-C. I am certifying that I agree with the content of this note as supervising physician. Enid Derry, [facility] 10/31/2017, 3:29  PM

## 2017-10-31 NOTE — Patient Instructions (Addendum)
-   Continue taking cough medicine and musinex - Continue taking albuterol inhaler - Start taking antibiotic doxycycline every 12 hours with food on your stomach. Recommend continuing probiotic.  - Get chest xray today. - Drink plenty of water ( goal is 64 ounces a day)  If not resolved in a week we will send you to pulmonology, and if symptoms worsen come back sooner or seek emergency medical treatment.

## 2017-11-01 DIAGNOSIS — E1165 Type 2 diabetes mellitus with hyperglycemia: Secondary | ICD-10-CM

## 2017-11-01 DIAGNOSIS — E538 Deficiency of other specified B group vitamins: Secondary | ICD-10-CM | POA: Insufficient documentation

## 2017-11-01 DIAGNOSIS — E1169 Type 2 diabetes mellitus with other specified complication: Secondary | ICD-10-CM | POA: Insufficient documentation

## 2017-11-01 HISTORY — DX: Type 2 diabetes mellitus with hyperglycemia: E11.65

## 2017-11-06 ENCOUNTER — Other Ambulatory Visit: Payer: BLUE CROSS/BLUE SHIELD

## 2017-11-07 ENCOUNTER — Telehealth: Payer: Self-pay | Admitting: Podiatry

## 2017-11-07 DIAGNOSIS — E1142 Type 2 diabetes mellitus with diabetic polyneuropathy: Secondary | ICD-10-CM | POA: Diagnosis not present

## 2017-11-07 DIAGNOSIS — M21172 Varus deformity, not elsewhere classified, left ankle: Secondary | ICD-10-CM | POA: Diagnosis not present

## 2017-11-07 DIAGNOSIS — G8929 Other chronic pain: Secondary | ICD-10-CM | POA: Diagnosis not present

## 2017-11-07 DIAGNOSIS — E1149 Type 2 diabetes mellitus with other diabetic neurological complication: Principal | ICD-10-CM

## 2017-11-07 DIAGNOSIS — M48061 Spinal stenosis, lumbar region without neurogenic claudication: Secondary | ICD-10-CM | POA: Diagnosis not present

## 2017-11-07 DIAGNOSIS — E1140 Type 2 diabetes mellitus with diabetic neuropathy, unspecified: Secondary | ICD-10-CM

## 2017-11-07 NOTE — Addendum Note (Signed)
Addended by: Harriett Sine D on: 11/07/2017 03:00 PM   Modules accepted: Orders

## 2017-11-07 NOTE — Telephone Encounter (Signed)
I informed Tillie Rung, PT, Dr. Jacqualyn Posey agreed with her PT schedule recommendations, and that Dr. Jacqualyn Posey wanted pt referred to neurology. Tillie Rung, PT states pt is established with Dr. Ashok Pall.

## 2017-11-07 NOTE — Telephone Encounter (Signed)
This is Olivia Romero, PT. I was calling to request home health PT twice a week for four weeks to see if we can get her ankle stronger. I also wanted to let you know about my findings. 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 I'm wondering if her inversion is neuralgically from her back? If you could just give me a call back and let me know. I know where you were planning to do an MRI of her ankle to see if there was any ligament issues involved, but maybe doing one on her back would be goo also. If you can give me a call when you get a chance, my number is [phone]. Thanks. Bye.

## 2017-11-07 NOTE — Telephone Encounter (Signed)
Faxed required form, clinicals and demographics to [facility].

## 2017-11-07 NOTE — Telephone Encounter (Signed)
Yes, refer her to neurology for this. Thanks.

## 2017-11-12 ENCOUNTER — Telehealth: Payer: Self-pay | Admitting: Podiatry

## 2017-11-12 NOTE — Telephone Encounter (Signed)
Dr. Jacqualyn Posey had mentioned something about me getting an MRI of my foot? My back doctor is wanting me to get one of my back and I don't know if I should have the one of my foot first? I'm currently in physical therapy at the moment as well. I can be reached at [phone]. Thank you.

## 2017-11-12 NOTE — Telephone Encounter (Signed)
Pt states Dr. Larose Hires states the problem is coming from her feet not her back. I told pt then we were still heading in the right direction getting the MRI of the ankle. I gave pt [facility] [phone] to schedule her MRI.

## 2017-11-12 NOTE — Telephone Encounter (Signed)
I informed pt Dr. Jacqualyn Posey was looking at a deformity of the ankle, and the back doctor would be looking for a problem with the back, so they both needed to be performed and if imaging rules allowed could be performed at the same time.

## 2017-11-19 ENCOUNTER — Ambulatory Visit: Payer: BLUE CROSS/BLUE SHIELD | Admitting: Family Medicine

## 2017-11-19 ENCOUNTER — Encounter: Payer: Self-pay | Admitting: Family Medicine

## 2017-11-19 ENCOUNTER — Encounter (INDEPENDENT_AMBULATORY_CARE_PROVIDER_SITE_OTHER): Payer: BLUE CROSS/BLUE SHIELD

## 2017-11-19 VITALS — BP 136/72 | HR 92 | Temp 98.0°F | Resp 14 | Ht 60.0 in | Wt 198.7 lb

## 2017-11-19 DIAGNOSIS — Z1239 Encounter for other screening for malignant neoplasm of breast: Secondary | ICD-10-CM

## 2017-11-19 DIAGNOSIS — Z1231 Encounter for screening mammogram for malignant neoplasm of breast: Secondary | ICD-10-CM

## 2017-11-19 DIAGNOSIS — M791 Myalgia, unspecified site: Secondary | ICD-10-CM

## 2017-11-19 DIAGNOSIS — T466X5A Adverse effect of antihyperlipidemic and antiarteriosclerotic drugs, initial encounter: Secondary | ICD-10-CM

## 2017-11-19 DIAGNOSIS — E538 Deficiency of other specified B group vitamins: Secondary | ICD-10-CM

## 2017-11-19 DIAGNOSIS — I10 Essential (primary) hypertension: Secondary | ICD-10-CM | POA: Diagnosis not present

## 2017-11-19 DIAGNOSIS — E1142 Type 2 diabetes mellitus with diabetic polyneuropathy: Secondary | ICD-10-CM

## 2017-11-19 DIAGNOSIS — E781 Pure hyperglyceridemia: Secondary | ICD-10-CM

## 2017-11-19 HISTORY — DX: Myalgia, unspecified site: M79.10

## 2017-11-19 HISTORY — DX: Adverse effect of antihyperlipidemic and antiarteriosclerotic drugs, initial encounter: T46.6X5A

## 2017-11-19 MED ORDER — CYANOCOBALAMIN 1000 MCG/ML IJ SOLN
1000.0000 ug | Freq: Once | INTRAMUSCULAR | Status: AC
  Administered 2017-11-19: 1000 ug via INTRAMUSCULAR

## 2017-11-19 MED ORDER — PANTOPRAZOLE SODIUM 40 MG PO TBEC: 40.0000 mg | DELAYED_RELEASE_TABLET | Freq: Every day | ORAL | 1 refills | Status: DC

## 2017-11-19 NOTE — Assessment & Plan Note (Signed)
Encouraged weight loss; she wants to walk more, but is wearing brace; see AVS

## 2017-11-19 NOTE — Assessment & Plan Note (Signed)
Controlled today; try DASH guidelines 

## 2017-11-19 NOTE — Assessment & Plan Note (Signed)
Cannot tolerate statin

## 2017-11-19 NOTE — Assessment & Plan Note (Signed)
Foot exam by MD today; managed primarily by endocrinologist

## 2017-11-19 NOTE — Patient Instructions (Addendum)
Check out the information at familydoctor.org entitled "Nutrition for Weight Loss: What You Need to Know about Fad Diets" Try to lose between 1-2 pounds per week by taking in fewer calories and burning off more calories You can succeed by limiting portions, limiting foods dense in calories and fat, becoming more active, and drinking 8 glasses of water a day (64 ounces) Don't skip meals, especially breakfast, as skipping meals may alter your metabolism Do not use over-the-counter weight loss pills or gimmicks that claim rapid weight loss A healthy BMI (or body mass index) is between 18.5 and 24.9 You can calculate your ideal BMI at the NIH website http://www.nhlbi.nih.gov/health/educational/lose_wt/BMI/bmicalc.htm   Preventing Unhealthy Weight Gain, Adult Staying at a healthy weight is important. When fat builds up in your body, you may become overweight or obese. These conditions put you at greater risk for developing certain health problems, such as heart disease, diabetes, sleeping problems, joint problems, and some cancers. Unhealthy weight gain is often the result of making unhealthy choices in what you eat. It is also a result of not getting enough exercise. You can make changes to your lifestyle to prevent obesity and stay as healthy as possible. What nutrition changes can be made? To maintain a healthy weight and prevent obesity:  Eat only as much as your body needs. To do this: ? Pay attention to signs that you are hungry or full. Stop eating as soon as you feel full. ? If you feel hungry, try drinking water first. Drink enough water so your urine is clear or pale yellow. ? Eat smaller portions. ? Look at serving sizes on food labels. Most foods contain more than one serving per container. ? Eat the recommended amount of calories for your gender and activity level. While most active people should eat around 2,000 calories per day, if you are trying to lose weight or are not very active,  you main need to eat less calories. Talk to your health care provider or dietitian about how many calories you should eat each day.  Choose healthy foods, such as: ? Fruits and vegetables. Try to fill at least half of your plate at each meal with fruits and vegetables. ? Whole grains, such as whole wheat bread, brown rice, and quinoa. ? Lean meats, such as chicken or fish. ? Other healthy proteins, such as beans, eggs, or tofu. ? Healthy fats, such as nuts, seeds, fatty fish, and olive oil. ? Low-fat or fat-free dairy.  Check food labels and avoid food and drinks that: ? Are high in calories. ? Have added sugar. ? Are high in sodium. ? Have saturated fats or trans fats.  Limit how much you eat of the following foods: ? Prepackaged meals. ? Fast food. ? Fried foods. ? Processed meat, such as bacon, sausage, and deli meats. ? Fatty cuts of red meat and poultry with skin.  Cook foods in healthier ways, such as by baking, broiling, or grilling.  When grocery shopping, try to shop around the outside of the store. This helps you buy mostly fresh foods and avoid canned and prepackaged foods.  What lifestyle changes can be made?  Exercise at least 30 minutes 5 or more days each week. Exercising includes brisk walking, yard work, biking, running, swimming, and team sports like basketball and soccer. Ask your health care provider which exercises are safe for you.  Do not use any products that contain nicotine or tobacco, such as cigarettes and e-cigarettes. If you need help quitting,   your health care provider.  Limit alcohol intake to no more than 1 drink a day for nonpregnant women and 2 drinks a day for men. One drink equals 12 oz of beer, 5 oz of wine, or 1 oz of hard liquor.  Try to get 7-9 hours of sleep each night. What other changes can be made?  Keep a food and activity journal to keep track of: ? What you ate and how many calories you had. Remember to count sauces, dressings,  and side dishes. ? Whether you were active, and what exercises you did. ? Your calorie, weight, and activity goals.  Check your weight regularly. Track any changes. If you notice you have gained weight, make changes to your diet or activity routine.  Avoid taking weight-loss medicines or supplements. Talk to your health care provider before starting any new medicine or supplement.  Talk to your health care provider before trying any new diet or exercise plan. Why are these changes important? Eating healthy, staying active, and having healthy habits not only help prevent obesity, they also:  Help you to manage stress and emotions.  Help you to connect with friends and family.  Improve your self-esteem.  Improve your sleep.  Prevent long-term health problems.  What can happen if changes are not made? Being obese or overweight can cause you to develop joint or bone problems, which can make it hard for you to stay active or do activities you enjoy. Being obese or overweight also puts stress on your heart and lungs and can lead to health problems like diabetes, heart disease, and some cancers. Where to find more information: Talk with your health care provider or a dietitian about healthy eating and healthy lifestyle choices. You may also find other information through these resources:  U.S. Department of Agriculture MyPlate: FormerBoss.no  American Heart Association: www.heart.org  Centers for Disease Control and Prevention: http://www.wolf.info/  Summary  Staying at a healthy weight is important. It helps prevent certain diseases and health problems, such as heart disease, diabetes, joint problems, sleep disorders, and some cancers.  Being obese or overweight can cause you to develop joint or bone problems, which can make it hard for you to stay active or do activities you enjoy.  You can prevent unhealthy weight gain by eating a healthy diet, exercising regularly, not smoking,  limiting alcohol, and getting enough sleep.  Talk with your health care provider or a dietitian for guidance about healthy eating and healthy lifestyle choices. This information is not intended to replace advice given to you by your health care provider. Make sure you discuss any questions you have with your health care provider. Document Released: 05/23/2016 Document Revised: 06/28/2016 Document Reviewed: 06/28/2016 Elsevier Interactive Patient Education  2018 [employer].  Obesity, Adult Obesity is the condition of having too much total body fat. Being overweight or obese means that your weight is greater than what is considered healthy for your body size. Obesity is determined by a measurement called BMI. BMI is an estimate of body fat and is calculated from height and weight. For adults, a BMI of 30 or higher is considered obese. Obesity can eventually lead to other health concerns and major illnesses, including:  Stroke.  Coronary artery disease (CAD).  Type 2 diabetes.  Some types of cancer, including cancers of the colon, breast, uterus, and gallbladder.  Osteoarthritis.  High blood pressure (hypertension).  High cholesterol.  Sleep apnea.  Gallbladder stones.  Infertility problems.  What are the causes?  The main cause of obesity is taking in (consuming) more calories than your body uses for energy. Other factors that contribute to this condition may include:  Being born with genes that make you more likely to become obese.  Having a medical condition that causes obesity. These conditions include: ? Hypothyroidism. ? Polycystic ovarian syndrome (PCOS). ? Binge-eating disorder. ? Cushing syndrome.  Taking certain medicines, such as steroids, antidepressants, and seizure medicines.  Not being physically active (sedentary lifestyle).  Living where there are limited places to exercise safely or buy healthy foods.  Not getting enough sleep.  What increases the  risk? The following factors may increase your risk of this condition:  Having a family history of obesity.  Being a woman of African-American descent.  Being a man of Hispanic descent.  What are the signs or symptoms? Having excessive body fat is the main symptom of this condition. How is this diagnosed? This condition may be diagnosed based on:  Your symptoms.  Your medical history.  A physical exam. Your health care provider may measure: ? Your BMI. If you are an adult with a BMI between 25 and less than 30, you are considered overweight. If you are an adult with a BMI of 30 or higher, you are considered obese. ? The distances around your hips and your waist (circumferences). These may be compared to each other to help diagnose your condition. ? Your skinfold thickness. Your health care provider may gently pinch a fold of your skin and measure it.  How is this treated? Treatment for this condition often includes changing your lifestyle. Treatment may include some or all of the following:  Dietary changes. Work with your health care provider and a dietitian to set a weight-loss goal that is healthy and reasonable for you. Dietary changes may include eating: ? Smaller portions. A portion size is the amount of a particular food that is healthy for you to eat at one time. This varies from person to person. ? Low-calorie or low-fat options. ? More whole grains, fruits, and vegetables.  Regular physical activity. This may include aerobic activity (cardio) and strength training.  Medicine to help you lose weight. Your health care provider may prescribe medicine if you are unable to lose 1 pound a week after 6 weeks of eating more healthily and doing more physical activity.  Surgery. Surgical options may include gastric banding and gastric bypass. Surgery may be done if: ? Other treatments have not helped to improve your condition. ? You have a BMI of 40 or higher. ? You have  life-threatening health problems related to obesity.  Follow these instructions at home:  Eating and drinking   Follow recommendations from your health care provider about what you eat and drink. Your health care provider may advise you to: ? Limit fast foods, sweets, and processed snack foods. ? Choose low-fat options, such as low-fat milk instead of whole milk. ? Eat 5 or more servings of fruits or vegetables every day. ? Eat at home more often. This gives you more control over what you eat. ? Choose healthy foods when you eat out. ? Learn what a healthy portion size is. ? Keep low-fat snacks on hand. ? Avoid sugary drinks, such as soda, fruit juice, iced tea sweetened with sugar, and flavored milk. ? Eat a healthy breakfast.  Drink enough water to keep your urine clear or pale yellow.  Do not go without eating for long periods of time (do not fast) or  follow a fad diet. Fasting and fad diets can be unhealthy and even dangerous. Physical Activity  Exercise regularly, as told by your health care provider. Ask your health care provider what types of exercise are safe for you and how often you should exercise.  Warm up and stretch before being active.  Cool down and stretch after being active.  Rest between periods of activity. Lifestyle  Limit the time that you spend in front of your TV, computer, or video game system.  Find ways to reward yourself that do not involve food.  Limit alcohol intake to no more than 1 drink a day for nonpregnant women and 2 drinks a day for men. One drink equals 12 oz of beer, 5 oz of wine, or 1 oz of hard liquor. General instructions  Keep a weight loss journal to keep track of the food you eat and how much you exercise you get.  Take over-the-counter and prescription medicines only as told by your health care provider.  Take vitamins and supplements only as told by your health care provider.  Consider joining a support group. Your health  care provider may be able to recommend a support group.  Keep all follow-up visits as told by your health care provider. This is important. Contact a health care provider if:  You are unable to meet your weight loss goal after 6 weeks of dietary and lifestyle changes. This information is not intended to replace advice given to you by your health care provider. Make sure you discuss any questions you have with your health care provider. Document Released: 06/29/2004 Document Revised: 10/25/2015 Document Reviewed: 03/10/2015 Elsevier Interactive Patient Education  2018 [employer].

## 2017-11-19 NOTE — Progress Notes (Signed)
BP 136/72   Pulse 92   Temp 98 F (36.7 C) (Oral)   Resp 14   Ht 5' (1.524 m)   Wt 198 lb 11.2 oz (90.1 kg)   SpO2 97%   BMI 38.81 kg/m    Subjective:    Patient ID: Olivia Romero, female    DOB: 1956-04-14, 62 y.o.   MRN: [id number]  HPI: Olivia Romero is a 62 y.o. female  Chief Complaint  Patient presents with  . Follow-up    HPI Patient is here for f/u  Patient is new to me; previous provider not available  Type 2 diabetes; seeing Dr. Gabriel Carina, endocrinologist; blood sugar was 100 this morning; not on metformin any more; now on invokamet, but has been itching some; that the only thing different; never had a problem with the metformin, so wondering if allergic to the invokana part Oct 23, 2017: normal CMP, glucose 117, A1c was 8.2; urine microalbumin:Cr normal at <11.6 Lab Results  Component Value Date   HGBA1C 8.2 10/23/2017    She has hx of osteomyelitis of right foot; podiatrist was Dr. Paulla Dolly and was hospitalized, IV antibiotics; then Dr. 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 Vira Agar, GI; lots of bleeding then too; it's better now; gets indigestion occasionally; has acid reflux; taking pantoprazole; not having dark stools; she tried to call them for the medicine; they need an appt  Morbid obesity; BMI >35 with comorbidities; she wants to walk but wearing brace, eating more salads and avoiding fatty foods  Depression screen [facility] 2/9 11/19/2017 01/31/2017 01/19/2017 12/28/2016 09/20/2016  Decreased Interest 0 0 0 0 0  Down, Depressed, Hopeless 0 0 0 0 0  PHQ - 2 Score 0 0 0 0 0    Altered sleeping 0 - - - -  Tired, decreased energy 0 - - - -  Change in appetite 0 - - - -  Feeling bad or failure about yourself  0 - - - -  Trouble concentrating 0 - - - -  Moving slowly or fidgety/restless 0 - - - -  Suicidal thoughts 0 - - - -  PHQ-9 Score 0 - - - -  Difficult doing work/chores Not difficult at all - - - -    Relevant past medical, surgical, family and social history reviewed Past Medical History:  Diagnosis Date  . Anemia   . Arthritis   . Asthma   . Carpal tunnel syndrome on right   . Colon polyps    adenomatous  . Diabetes mellitus without complication ([place])   . Diverticulosis of colon   . Esophagitis   . GERD (gastroesophageal reflux disease)   . Hemorrhoid    internal  . Hyperlipemia   . Hypertension   . Neuropathy   . Pneumonia   . PONV (postoperative nausea and vomiting)   . Sleep apnea    has had in the past lost 50 pounds and do longer uses cpap  . Stroke-like episode ([place]) 2009   TIA   Past Surgical History:  Procedure Laterality Date  . ABDOMINAL  HYSTERECTOMY    . BACK SURGERY  09/02/2015  . CERVICAL FUSION    . CESAREAN SECTION  1986  . COLONOSCOPY N/A 11/30/2012   Procedure: COLONOSCOPY;  Surgeon: Irene Shipper, MD;  Location: [facility] ENDOSCOPY;  Service: Endoscopy;  Laterality: N/A;  . COLONOSCOPY WITH PROPOFOL N/A 01/03/2016   Procedure: COLONOSCOPY WITH PROPOFOL;  Surgeon: Manya Silvas, MD;  Location[facility] ENDOSCOPY;  Service: Endoscopy;  Laterality: N/A;  . ESOPHAGOGASTRODUODENOSCOPY (EGD) WITH PROPOFOL N/A 01/14/2015   Procedure: ESOPHAGOGASTRODUODENOSCOPY (EGD) WITH PROPOFOL;  Surgeon: Manya Silvas, MD;  Location: [facility] ENDOSCOPY;  Service: Endoscopy;  Laterality: N/A;  . LUMBAR WOUND DEBRIDEMENT N/A 10/01/2015   Procedure: LUMBAR WOUND DEBRIDEMENT;  Surgeon: Ashok Pall, MD;  Location: [facility] NEURO ORS;  Service: Neurosurgery;  Laterality: N/A;  LUMBAR WOUND DEBRIDEMENT  . SAVORY DILATION N/A 01/14/2015   Procedure: SAVORY  DILATION;  Surgeon: Manya Silvas, MD;  Location: [facility] ENDOSCOPY;  Service: Endoscopy;  Laterality: N/A;  . TONSILLECTOMY     Family History  Problem Relation Age of Onset  . Lung cancer Father   . Hypertension Father   . Arthritis Father   . Other Mother        hardening of the arteries/renal cell carcenoma  . Hypertension Mother   . Stroke Mother   . Kidney cancer Mother   . Arthritis Brother   . Rheum arthritis Maternal Uncle   . Bladder Cancer Neg Hx    Social History   Tobacco Use  . Smoking status: Never Smoker  . Smokeless tobacco: Never Used  Substance Use Topics  . Alcohol use: Not Currently  . Drug use: No     Office Visit from 11/19/2017 in [facility]  AUDIT-C Score  0      Interim medical history since last visit reviewed. Allergies and medications reviewed  Review of Systems Per HPI unless specifically indicated above     Objective:    BP 136/72   Pulse 92   Temp 98 F (36.7 C) (Oral)   Resp 14   Ht 5' (1.524 m)   Wt 198 lb 11.2 oz (90.1 kg)   SpO2 97%   BMI 38.81 kg/m   Wt Readings from Last 3 Encounters:  11/19/17 198 lb 11.2 oz (90.1 kg)  10/31/17 201 lb 8 oz (91.4 kg)  08/28/17 198 lb (89.8 kg)    Physical Exam  Constitutional: She appears well-developed and well-nourished. No distress.  HENT:  Head: Normocephalic and atraumatic.  Eyes: EOM are normal. No scleral icterus.  Neck: No thyromegaly present.  Cardiovascular: Normal rate, regular rhythm and normal heart sounds.  No murmur heard. Pulmonary/Chest: Effort normal and breath sounds normal. No respiratory distress. She has no wheezes.  Abdominal: Soft. Bowel sounds are normal. She exhibits no distension.  Musculoskeletal: Normal range of motion. She exhibits no edema.  Neurological: She is alert. She exhibits normal muscle tone.  Skin: Skin is warm and dry. She is not diaphoretic. No pallor.  Psychiatric: She has a normal mood and affect. Her behavior is  normal. Judgment and thought content normal.   Diabetic Foot Form - Detailed   Diabetic Foot Exam - detailed Diabetic Foot exam was performed with the following findings:  Yes 11/19/2017  9:40 AM  Visual Foot Exam completed.:  Yes  Pulse Foot Exam completed.:  Yes  Right Dorsalis Pedis:  Present Left Dorsalis Pedis:  Present  Sensory Foot Exam Completed.:  Yes Semmes-Weinstein Monofilament Test R Site  1-Great Toe:  Neg L Site 1-Great Toe:  Neg    Comments:  Diminished sensation to monofilament testing to just above the ankles bilaterally; thick callus laterally; soles of the foot normal without callus       Assessment & Plan:   Problem List Items Addressed This Visit      Cardiovascular and Mediastinum   Essential hypertension, benign    Controlled today; try DASH guidelines        Endocrine   Diabetic peripheral neuropathy associated with type 2 diabetes mellitus ([place])    Foot exam by MD today; managed primarily by endocrinologist      Relevant Medications   INVOKAMET XR [phone] MG TB24   Insulin Degludec 200 UNIT/ML SOPN     Other   Myalgia due to statin    Cannot tolerate statin      Morbid obesity ([place])    Encouraged weight loss; she wants to walk more, but is wearing brace; see AVS      Relevant Medications   INVOKAMET XR [phone] MG TB24   Insulin Degludec 200 UNIT/ML SOPN   Hypertriglyceridemia    Just on tricor; could not tolerate the statin; not on vascepa any more       Other Visit Diagnoses    Vitamin B12 deficiency    -  Primary   Relevant Medications   cyanocobalamin ((VITAMIN B-12)) injection 1,000 mcg (Completed)   Screening for breast cancer       Relevant Orders   MM Digital Screening       Follow up plan: Return in about 6 months (around 05/21/2018) for health issues with Raelyn Ensign, NP.  An after-visit summary was printed and given to the patient at [place].  Please see the patient instructions which may contain other information  and recommendations beyond what is mentioned above in the assessment and plan.  Meds ordered this encounter  Medications  . pantoprazole (PROTONIX) 40 MG tablet    Sig: Take 1 tablet (40 mg total) by mouth daily.    Dispense:  30 tablet    Refill:  1  . cyanocobalamin ((VITAMIN B-12)) injection 1,000 mcg    Orders Placed This Encounter  Procedures  . MM Digital Screening

## 2017-11-19 NOTE — Assessment & Plan Note (Signed)
Just on tricor; could not tolerate the statin; not on vascepa any more

## 2017-11-20 ENCOUNTER — Encounter: Payer: Self-pay | Admitting: Family Medicine

## 2017-11-21 ENCOUNTER — Ambulatory Visit
Admission: RE | Admit: 2017-11-21 | Discharge: 2017-11-21 | Disposition: A | Discharge status: Home / Self Care | Payer: BLUE CROSS/BLUE SHIELD | Source: Ambulatory Visit | Attending: Podiatry | Admitting: Podiatry

## 2017-11-27 ENCOUNTER — Other Ambulatory Visit: Payer: Self-pay | Admitting: Podiatry

## 2017-11-27 ENCOUNTER — Ambulatory Visit (INDEPENDENT_AMBULATORY_CARE_PROVIDER_SITE_OTHER): Payer: BLUE CROSS/BLUE SHIELD

## 2017-11-27 ENCOUNTER — Ambulatory Visit (INDEPENDENT_AMBULATORY_CARE_PROVIDER_SITE_OTHER): Payer: BLUE CROSS/BLUE SHIELD | Admitting: Podiatry

## 2017-11-27 ENCOUNTER — Encounter: Payer: Self-pay | Admitting: Podiatry

## 2017-11-27 DIAGNOSIS — M14672 Charcot's joint, left ankle and foot: Secondary | ICD-10-CM

## 2017-11-27 DIAGNOSIS — M84375A Stress fracture, left foot, initial encounter for fracture: Secondary | ICD-10-CM | POA: Diagnosis not present

## 2017-11-27 DIAGNOSIS — M6281 Muscle weakness (generalized): Secondary | ICD-10-CM

## 2017-11-27 DIAGNOSIS — R260 Ataxic gait: Secondary | ICD-10-CM

## 2017-11-27 DIAGNOSIS — M24272 Disorder of ligament, left ankle: Secondary | ICD-10-CM

## 2017-11-27 DIAGNOSIS — M84375D Stress fracture, left foot, subsequent encounter for fracture with routine healing: Secondary | ICD-10-CM

## 2017-11-27 DIAGNOSIS — R262 Difficulty in walking, not elsewhere classified: Secondary | ICD-10-CM

## 2017-11-28 NOTE — Progress Notes (Addendum)
Subjective: 62 year old female presents the office today for follow-up evaluation and to discuss MRI results in more detail.  She states that her ankle still turning but she did find a pair shoes that she can wear the brace to help prevent her ankle from rolling over.  She has been doing physical therapy and she does feel that therapy has been helping quite a bit and she is been increasing her motion.  She still gets the areas of red spots to the bottom of the left foot but overall this is doing better as well and she denies any skin breakdown.  She does state that when she wears the walking boot she feels more stable denies any systemic complaints such as fevers, chills, nausea, vomiting. No acute changes since last appointment, and no other complaints at this time.   Objective: AAO x3, NAD DP/PT pulses palpable bilaterally, CRT less than 3 seconds There is a varus deformity present to the left ankle. I am able to get it almost to rectus position. There is no area of pinpoint tenderness identified at this time and overall her swelling has improved. There is mild erythema along the plantar 5th metatarsal head and 5th metatarsal base area. No skin breakdown is present.  Specifically there is no tenderness palpation along the metatarsals there is no significant edema to this area.  No significant discomfort on the course of the peroneal tendons as well. No open lesions or pre-ulcerative lesions.  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  Plan: -All treatment options discussed with the patient including all alternatives, risks, complications.  -X-rays were obtained reviewed.  There is significant arthritic changes present the lateral aspect of the foot and does appear to be worse compared to prior x-ray but there is no definitive evidence of acute fracture identified at this time.  Given the  x-ray findings that I do want her to wear the CAM boot.  A new CAM boot was dispensed. She can continue physical therapy to work on the range of motion however.  I also discussed the case with [facility] and she is going to mold her today for a new type of brace and will be more flexible for her but also give her more support.  I do think she had a change in her medical condition given that there is deformity to her ankle as well as the tendon tear and concern for Charcot, difficulty walking and weakness.  -Patient encouraged to call the office with any questions, concerns, change in symptoms.   *x-ray next appointment   RTC 3 weeks or sooner if needed  Trula Slade DPM

## 2018-01-09 ENCOUNTER — Ambulatory Visit: Payer: BLUE CROSS/BLUE SHIELD | Admitting: Orthotics

## 2018-01-09 DIAGNOSIS — M21172 Varus deformity, not elsewhere classified, left ankle: Secondary | ICD-10-CM

## 2018-01-09 DIAGNOSIS — R260 Ataxic gait: Secondary | ICD-10-CM

## 2018-01-09 DIAGNOSIS — R262 Difficulty in walking, not elsewhere classified: Secondary | ICD-10-CM

## 2018-01-09 DIAGNOSIS — M24272 Disorder of ligament, left ankle: Secondary | ICD-10-CM

## 2018-01-09 DIAGNOSIS — M6281 Muscle weakness (generalized): Secondary | ICD-10-CM

## 2018-01-09 DIAGNOSIS — M14672 Charcot's joint, left ankle and foot: Secondary | ICD-10-CM

## 2018-01-09 DIAGNOSIS — E1140 Type 2 diabetes mellitus with diabetic neuropathy, unspecified: Secondary | ICD-10-CM

## 2018-01-09 DIAGNOSIS — E1149 Type 2 diabetes mellitus with other diabetic neurological complication: Secondary | ICD-10-CM

## 2018-01-09 DIAGNOSIS — M84375A Stress fracture, left foot, initial encounter for fracture: Secondary | ICD-10-CM

## 2018-01-14 NOTE — Progress Notes (Signed)
Patient came in today to pick up standard Afo brace.  Patient was evaluated for fit and function.   The brace fit very well and there were any complaints of the way it felt once donned.  The brace offered ankle stability in both saggital and coroneal planes.  Patient advised to always wear proper fitting shoes with brace. 

## 2018-01-15 ENCOUNTER — Encounter: Payer: Self-pay | Admitting: Podiatry

## 2018-01-15 ENCOUNTER — Ambulatory Visit (INDEPENDENT_AMBULATORY_CARE_PROVIDER_SITE_OTHER): Payer: BLUE CROSS/BLUE SHIELD

## 2018-01-15 ENCOUNTER — Ambulatory Visit (INDEPENDENT_AMBULATORY_CARE_PROVIDER_SITE_OTHER): Payer: BLUE CROSS/BLUE SHIELD | Admitting: Podiatry

## 2018-01-15 DIAGNOSIS — M84375A Stress fracture, left foot, initial encounter for fracture: Secondary | ICD-10-CM | POA: Diagnosis not present

## 2018-01-15 DIAGNOSIS — B351 Tinea unguium: Secondary | ICD-10-CM | POA: Diagnosis not present

## 2018-01-15 DIAGNOSIS — E1149 Type 2 diabetes mellitus with other diabetic neurological complication: Secondary | ICD-10-CM

## 2018-01-15 DIAGNOSIS — M84375D Stress fracture, left foot, subsequent encounter for fracture with routine healing: Secondary | ICD-10-CM | POA: Diagnosis not present

## 2018-01-15 DIAGNOSIS — M79674 Pain in right toe(s): Secondary | ICD-10-CM | POA: Diagnosis not present

## 2018-01-15 DIAGNOSIS — M14672 Charcot's joint, left ankle and foot: Secondary | ICD-10-CM

## 2018-01-15 DIAGNOSIS — M79675 Pain in left toe(s): Secondary | ICD-10-CM

## 2018-01-16 NOTE — Progress Notes (Signed)
Subjective: 62 year old female presents the office today for follow-up evaluation of left foot and ankle deformity as well as her pre-ulcerative areas to bilateral feet.  She states that since her last saw her she had a new brace and this is been much more comfortable for her and she is been able to wear regular shoe.  She is very pleased that there is no significant redness to her feet there is no pressure points that she has noticed or any new sores or calluses.  Her nails are also thickened elongated she cannot remember self.  She tries to trim them and she states that she makes her self bleed.  She has no other concerns today.  She is ambulatory purchase new shoes soon.  Objective: AAO x3, NAD DP/PT pulses palpable bilaterally, CRT less than 3 seconds There is deformity is present in the left ankle and foot however at this time there is no significant erythema present.  Specifically along the lateral aspect where she has a high pressure points previously these have resolved and there is no erythematous areas or pre-ulcerative calluses or wounds present today bilaterally.  Nails are hypertrophic, dystrophic, discolored and elongated with tenderness palpation to nails 1-5 bilaterally.  No surrounding redness or drainage or any signs of infection present. She started to get somewhat of a cavus deformity of the right side as well but is flexible and no pain. 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  -Repeat x-rays were obtained and reviewed which did not reveal any evidence of acute fracture or stress fracture today.  Significant arthritic changes present.  Cavus deformity present. -She is doing much better with a new brace.  Continue with this.  Appears to be fitting well.  She is in a purchasing new shoes.   Ultimately recommended to continue the brace long-term.  She does not want to undergo any surgical intervention at this point.  Continue with physical therapy at home as she is been discharged on physical therapy as well. -Nails debrided x10 without any complications or bleeding -Discussed the importance of daily foot inspection -Follow-up in 9 weeks or sooner if needed  Trula Slade DPM

## 2018-02-05 DIAGNOSIS — E11610 Type 2 diabetes mellitus with diabetic neuropathic arthropathy: Secondary | ICD-10-CM | POA: Diagnosis not present

## 2018-02-05 DIAGNOSIS — E781 Pure hyperglyceridemia: Secondary | ICD-10-CM | POA: Diagnosis not present

## 2018-02-05 DIAGNOSIS — Z794 Long term (current) use of insulin: Secondary | ICD-10-CM | POA: Diagnosis not present

## 2018-02-05 DIAGNOSIS — E875 Hyperkalemia: Secondary | ICD-10-CM | POA: Diagnosis not present

## 2018-02-05 DIAGNOSIS — E538 Deficiency of other specified B group vitamins: Secondary | ICD-10-CM | POA: Diagnosis not present

## 2018-02-05 DIAGNOSIS — E669 Obesity, unspecified: Secondary | ICD-10-CM | POA: Diagnosis not present

## 2018-02-05 DIAGNOSIS — H60332 Swimmer's ear, left ear: Secondary | ICD-10-CM | POA: Diagnosis not present

## 2018-02-05 DIAGNOSIS — E1169 Type 2 diabetes mellitus with other specified complication: Secondary | ICD-10-CM | POA: Diagnosis not present

## 2018-02-05 DIAGNOSIS — E1142 Type 2 diabetes mellitus with diabetic polyneuropathy: Secondary | ICD-10-CM | POA: Diagnosis not present

## 2018-02-07 DIAGNOSIS — H60339 Swimmer's ear, unspecified ear: Secondary | ICD-10-CM | POA: Diagnosis not present

## 2018-02-07 DIAGNOSIS — D481 Neoplasm of uncertain behavior of connective and other soft tissue: Secondary | ICD-10-CM | POA: Diagnosis not present

## 2018-02-21 ENCOUNTER — Ambulatory Visit (INDEPENDENT_AMBULATORY_CARE_PROVIDER_SITE_OTHER): Payer: Medicare Other | Admitting: Podiatry

## 2018-02-21 ENCOUNTER — Encounter: Payer: Self-pay | Admitting: Podiatry

## 2018-02-21 ENCOUNTER — Ambulatory Visit (INDEPENDENT_AMBULATORY_CARE_PROVIDER_SITE_OTHER): Payer: Medicare Other

## 2018-02-21 VITALS — Temp 98.0°F

## 2018-02-21 DIAGNOSIS — L03032 Cellulitis of left toe: Secondary | ICD-10-CM | POA: Diagnosis not present

## 2018-02-21 DIAGNOSIS — H60332 Swimmer's ear, left ear: Secondary | ICD-10-CM | POA: Diagnosis not present

## 2018-02-21 DIAGNOSIS — L97521 Non-pressure chronic ulcer of other part of left foot limited to breakdown of skin: Secondary | ICD-10-CM | POA: Diagnosis not present

## 2018-02-21 DIAGNOSIS — E1149 Type 2 diabetes mellitus with other diabetic neurological complication: Secondary | ICD-10-CM | POA: Diagnosis not present

## 2018-02-21 DIAGNOSIS — M14672 Charcot's joint, left ankle and foot: Secondary | ICD-10-CM | POA: Diagnosis not present

## 2018-02-21 DIAGNOSIS — L02612 Cutaneous abscess of left foot: Secondary | ICD-10-CM

## 2018-02-21 MED ORDER — CIPROFLOXACIN HCL 500 MG PO TABS: 500.0000 mg | ORAL_TABLET | Freq: Two times a day (BID) | ORAL | 0 refills | Status: DC

## 2018-02-21 MED ORDER — CLINDAMYCIN HCL 300 MG PO CAPS: 300.0000 mg | ORAL_CAPSULE | Freq: Three times a day (TID) | ORAL | 0 refills | Status: DC

## 2018-02-25 NOTE — Progress Notes (Signed)
Subjective: 62 year old female presents the office today for concerns of a wound on the left foot.  She states is been ongoing for last couple days but she wanted to wait to see me so she held off on appointment.  She denies any pain to the area she has neuropathy but she denies any increase in swelling or redness to her feet she denies any drainage or pus.  She does note that her ankles continue to turn inwards.  She states that she is to be able to move the ankle but she is no longer able to do so. Denies any systemic complaints such as fevers, chills, nausea, vomiting. No acute changes since last appointment, and no other complaints at this time.   Objective: AAO x3, NAD DP/PT pulses palpable bilaterally, CRT less than 3 seconds Varus deformity present to the ankle and the foot.  She is putting pressure on the lateral aspect of the foot there is a pre-ulcerative area to the fifth metatarsal head.  To the fifth metatarsal base is a superficial granular wound.  Almost appears that there was a blister that had popped.  There is surrounding erythema but there is no ascending cellulitis.  There is no fluctuation or crepitation or any malodor.  Chronic swelling to the foot. No open lesions or pre-ulcerative lesions.  No pain with calf compression, swelling, warmth, erythema  Assessment: Left foot ulceration with cellulitis  Plan: -All treatment options discussed with the patient including all alternatives, risks, complications.  -X-rays were obtained and reviewed.  No definitive evidence of acute osteomyelitis or soft tissue emphysema present.  Significant varus deformity is present as well as arthritic changes.  Charcot changes present.  -I did mark the area of cellulitis.  If this were to extend she is to go the emergency room.  Will start clindamycin and ciprofloxacin. -Continue CAM boot.  -Long-term we discussed multiple options for her ankle.  She does not want to proceed with a sense of surgery  although I think it might be beneficial for her.  However I did discuss the case with Liliane Channel as well and we are going to try to fit her for a [name] walker.  I will see her back in 1 week to check the wound, infection and what this can see her for molding of the [place]. -Patient encouraged to call the office with any questions, concerns, change in symptoms.   Trula Slade DPM

## 2018-02-28 ENCOUNTER — Ambulatory Visit: Payer: BLUE CROSS/BLUE SHIELD | Admitting: Orthotics

## 2018-02-28 ENCOUNTER — Encounter: Payer: Self-pay | Admitting: Podiatry

## 2018-02-28 ENCOUNTER — Ambulatory Visit (INDEPENDENT_AMBULATORY_CARE_PROVIDER_SITE_OTHER): Payer: Medicare Other | Admitting: Podiatry

## 2018-02-28 VITALS — Temp 97.8°F

## 2018-02-28 DIAGNOSIS — E1149 Type 2 diabetes mellitus with other diabetic neurological complication: Secondary | ICD-10-CM

## 2018-02-28 DIAGNOSIS — M14672 Charcot's joint, left ankle and foot: Secondary | ICD-10-CM

## 2018-02-28 DIAGNOSIS — L02612 Cutaneous abscess of left foot: Secondary | ICD-10-CM

## 2018-02-28 DIAGNOSIS — M84375D Stress fracture, left foot, subsequent encounter for fracture with routine healing: Secondary | ICD-10-CM

## 2018-02-28 DIAGNOSIS — L97521 Non-pressure chronic ulcer of other part of left foot limited to breakdown of skin: Secondary | ICD-10-CM

## 2018-02-28 DIAGNOSIS — L03032 Cellulitis of left toe: Secondary | ICD-10-CM

## 2018-02-28 NOTE — Progress Notes (Signed)
Patient was cast today per dr. Jacqualyn Posey for [place] walker;  Patient has fixed RF varus deformity and charcot ucler plantar surface lateral side.  Patietn was cast with fiberglass tape and also impression made in foam for area to be offloaded

## 2018-03-01 NOTE — Progress Notes (Signed)
Subjective: 62 year old female presents the office today for follow-up evaluation of a wound to left foot.  She is that overall she is doing much better.  She states the antibody for any side effects.  She also saw a Liliane Channel today to be measured for an [place] walker.  She denies any drainage or pus coming from the wound.  She denies any red streaks in the redness that she was having previously has resolved.Denies any systemic complaints such as fevers, chills, nausea, vomiting. No acute changes since last appointment, and no other complaints at this time.   Objective: AAO x3, NAD DP/PT pulses palpable bilaterally, CRT less than 3 seconds Ulceration present on the fifth metatarsal base on the plantar lateral aspect the left foot measuring 1.7 x 1.4 cm.  There is a superficial wound with a granular wound base.  There is no probing, undermining or tunneling.  There is no surrounding erythema, ascending cellulitis.  There is no fluctuation or crepitation or any malodor.  Overall the area of infection has improved. No open lesions or pre-ulcerative lesions.  No pain with calf compression, swelling, warmth, erythema  Assessment: Left foot ulcer  Plan: -All treatment options discussed with the patient including all alternatives, risks, complications.  -Overall the wound is improving.  Finish the course of antibiotics.  Continue with daily dressing changes as she has been doing.  Continue the cam boot for now.  She is also using a crutch to help take pressure off the area as well.  Monitoring signs or symptoms of infection. -Rick molded her for a [name] walker today.  -Patient encouraged to call the office with any questions, concerns, change in symptoms.   Trula Slade DPM

## 2018-03-04 ENCOUNTER — Telehealth: Payer: Self-pay | Admitting: Podiatry

## 2018-03-04 NOTE — Telephone Encounter (Signed)
Contacted pt in 3:52pm Telephone Call.

## 2018-03-04 NOTE — Telephone Encounter (Signed)
Pt is running a low grade fever. Dr Jacqualyn Posey told her to keep and eye out.  please give her a call back

## 2018-03-04 NOTE — Telephone Encounter (Signed)
Pt is having chills/no fever.

## 2018-03-04 NOTE — Telephone Encounter (Signed)
Pt states she has 99.1 temperature, bad chills and freezing then sweats for 2 days. Pt states the wound is very red.

## 2018-03-04 NOTE — Telephone Encounter (Signed)
I called [name] back and she states that she was on her foot a lot on Saturday, more than normal. Yesterday she started to have chills and overall she is not feeling well. 2 hours ago she checked her temperature and went to 99.1 and it has been around 97 degrees. She is still on the clindamycin and ciproflaxcin. She has had more bloody drainage from the wound and the wound is red and does not have any pus. There is some pink around the wound. She states it is not red around the wound and no red streaks.   Continue antibiotics. Elevate tonight and stay off of it. Tylenol for fever. Recheck temperature and if worsening go to the ER or if she notices any red streaks, increase pain, pus or other signs of infection. Otherwise I will see her as scheduled tomorrow.

## 2018-03-05 ENCOUNTER — Ambulatory Visit: Payer: Medicare Other | Admitting: Podiatry

## 2018-03-05 ENCOUNTER — Ambulatory Visit (INDEPENDENT_AMBULATORY_CARE_PROVIDER_SITE_OTHER): Payer: Medicare Other

## 2018-03-05 VITALS — Temp 98.6°F

## 2018-03-05 DIAGNOSIS — L02619 Cutaneous abscess of unspecified foot: Secondary | ICD-10-CM

## 2018-03-05 DIAGNOSIS — L97521 Non-pressure chronic ulcer of other part of left foot limited to breakdown of skin: Secondary | ICD-10-CM

## 2018-03-05 DIAGNOSIS — L03119 Cellulitis of unspecified part of limb: Secondary | ICD-10-CM | POA: Diagnosis not present

## 2018-03-05 MED ORDER — DOXYCYCLINE HYCLATE 100 MG PO TABS: 100.0000 mg | ORAL_TABLET | Freq: Two times a day (BID) | ORAL | 0 refills | Status: DC

## 2018-03-06 LAB — CBC WITH DIFFERENTIAL/PLATELET
BASOS ABS: 80 {cells}/uL (ref 0–200)
BASOS PCT: 0.8 %
EOS PCT: 3.4 %
Eosinophils Absolute: 340 cells/uL (ref 15–500)
HCT: 38.4 % (ref 35.0–45.0)
Hemoglobin: 12.2 g/dL (ref 11.7–15.5)
LYMPHS ABS: 2940 {cells}/uL (ref 850–3900)
MCH: 24.3 pg — ABNORMAL LOW (ref 27.0–33.0)
MCHC: 31.8 g/dL — AB (ref 32.0–36.0)
MCV: 76.5 fL — ABNORMAL LOW (ref 80.0–100.0)
MONOS PCT: 7.3 %
MPV: 11 fL (ref 7.5–12.5)
Neutro Abs: 5910 cells/uL (ref 1500–7800)
Neutrophils Relative %: 59.1 %
Platelets: 404 10*3/uL — ABNORMAL HIGH (ref 140–400)
RBC: 5.02 10*6/uL (ref 3.80–5.10)
RDW: 14.5 % (ref 11.0–15.0)
Total Lymphocyte: 29.4 %
WBC mixed population: 730 cells/uL (ref 200–950)
WBC: 10 10*3/uL (ref 3.8–10.8)

## 2018-03-06 LAB — C-REACTIVE PROTEIN: CRP: 11.3 mg/L — ABNORMAL HIGH (ref <8.0)

## 2018-03-06 LAB — HEMOGLOBIN A1C
EAG (MMOL/L): 8.4 (calc)
Hgb A1c MFr Bld: 6.9 % of total Hgb — ABNORMAL HIGH (ref <5.7)
MEAN PLASMA GLUCOSE: 151 (calc)

## 2018-03-06 LAB — SEDIMENTATION RATE: Sed Rate: 28 mm/h (ref 0–30)

## 2018-03-07 DIAGNOSIS — H60332 Swimmer's ear, left ear: Secondary | ICD-10-CM | POA: Diagnosis not present

## 2018-03-08 ENCOUNTER — Other Ambulatory Visit: Payer: Self-pay | Admitting: Podiatry

## 2018-03-08 ENCOUNTER — Ambulatory Visit: Payer: Medicare Other | Admitting: Podiatry

## 2018-03-08 DIAGNOSIS — L97521 Non-pressure chronic ulcer of other part of left foot limited to breakdown of skin: Secondary | ICD-10-CM

## 2018-03-08 LAB — WOUND CULTURE
MICRO NUMBER:: 91177824
SPECIMEN QUALITY: ADEQUATE

## 2018-03-08 MED ORDER — SULFAMETHOXAZOLE-TRIMETHOPRIM 800-160 MG PO TABS: 1.0000 | ORAL_TABLET | Freq: Two times a day (BID) | ORAL | 0 refills | Status: DC

## 2018-03-10 NOTE — Progress Notes (Signed)
Subjective: 62 year old female presents the office today for concerns of worsening to her left foot wound.  She did call yesterday she is having some fevers and chills and she is not feeling well overall.  She presents today for further evaluation.  She has noticed some mild redness coming along the wound but denies any increase in drainage or pus.  No recent injury or falls and she has no other systemic symptoms that could be causing fevers including respiratory, urinary tract infection. Denies any systemic complaints such as fevers, chills, nausea, vomiting. No acute changes since last appointment, and no other complaints at this time.   Objective: AAO x3, NAD DP/PT pulses palpable bilaterally, CRT less than 3 seconds 2.3 x 2 cm.  This is a granular wound base but there is surrounding erythema today.  There started to be mild amount of extending cellulitis that she was having previously but not as extensive which she had.  There is no fluctuation or crepitation there is no malodor.  No significant increase in swelling to the foot compared to contralateral extremity there is no increase in warmth of the foot. Ulceration on the fifth metatarsal base today measures approximately no open lesions or pre-ulcerative lesions.  No pain with calf compression, swelling, warmth, erythema  Assessment: Ulceration left foot with cellulitis although localized  Plan: -All treatment options discussed with the patient including all alternatives, risks, complications.  -X-rays were obtained reviewed.  Significant arthritic changes and Charcot deformity is present.  No definitive evidence of cortical destruction suggestive of osteomyelitis there is no soft tissue emphysema. -I took a wound culture today although this was a superficial culture.  Check blood work including ESR, CRP, CBC, A1c. -We will change antibiotic to doxycycline.   -Discussed that there is any worsening she is to report immediately to the emergency  room.  Otherwise I will see her back on Friday for check to see how she is doing before the weekend.  Trula Slade DPM

## 2018-03-10 NOTE — Progress Notes (Signed)
Subjective: 62 year old female presents the office today for follow-up evaluation of a wound to left foot.  She states that overall she is feeling better and she no longer has any chills that she was having no fevers.  She feels much improved.  She can continue the cam boot and she is been using a crutch for some stability.  She has been changing the bandage daily and she denies any drainage or pus and she states the swelling of the redness is much improved from when I last saw her.  She is been continuing the antibiotics without any side effects.  She has no other concerns today.  Objective: AAO x3, NAD DP/PT pulses palpable bilaterally, CRT less than 3 seconds Ulceration present on the fifth metatarsal base on the plantar lateral aspect the left foot measuring  Approximately 2.3 x 1.8 cm.  Wound base is granular.  There is minimal surrounding erythema but this is much improved from the last saw her.  There is no fluctuation or crepitation.  Is no pain.  There is no malodor.  There is no drainage or pus. No open lesions or pre-ulcerative lesions.  No pain with calf compression, swelling, warmth, erythema  Assessment: Left foot ulcer  Plan: -All treatment options discussed with the patient including all alternatives, risks, complications.  -Decreased signs of infection today.  Continue antibiotics.  Awaiting wound culture.  Discussed blood work results.  Continue daily dressing changes with antibiotic ointment and a bandage.  Continue in cam boot at all times.  Elevation.  Watch for signs or symptoms of infection worsening to call the office or go to the emergency room should any occur.finish course of antibiotics. Awaiting wound culture.  Return in about 10 days (around 03/18/2018).  Trula Slade DPM

## 2018-03-11 ENCOUNTER — Encounter: Payer: Self-pay | Admitting: Family Medicine

## 2018-03-11 ENCOUNTER — Emergency Department (HOSPITAL_COMMUNITY): Payer: Medicare Other

## 2018-03-11 ENCOUNTER — Telehealth: Payer: Self-pay | Admitting: *Deleted

## 2018-03-11 ENCOUNTER — Ambulatory Visit: Payer: BLUE CROSS/BLUE SHIELD | Admitting: Family Medicine

## 2018-03-11 ENCOUNTER — Other Ambulatory Visit: Payer: Self-pay

## 2018-03-11 ENCOUNTER — Encounter (HOSPITAL_COMMUNITY): Payer: Self-pay

## 2018-03-11 ENCOUNTER — Inpatient Hospital Stay (HOSPITAL_COMMUNITY)
Admission: EM | Admit: 2018-03-11 | Discharge: 2018-03-17 | DRG: 629 | Disposition: A | Discharge status: Home Health | Payer: Medicare Other | Source: Ambulatory Visit | Attending: Internal Medicine | Admitting: Internal Medicine

## 2018-03-11 VITALS — BP 156/92 | HR 121 | Temp 102.4°F | Resp 20 | Ht 60.0 in | Wt 189.0 lb

## 2018-03-11 DIAGNOSIS — L97522 Non-pressure chronic ulcer of other part of left foot with fat layer exposed: Secondary | ICD-10-CM | POA: Diagnosis not present

## 2018-03-11 DIAGNOSIS — R509 Fever, unspecified: Secondary | ICD-10-CM | POA: Diagnosis not present

## 2018-03-11 DIAGNOSIS — Z823 Family history of stroke: Secondary | ICD-10-CM | POA: Diagnosis not present

## 2018-03-11 DIAGNOSIS — Z7982 Long term (current) use of aspirin: Secondary | ICD-10-CM | POA: Diagnosis not present

## 2018-03-11 DIAGNOSIS — E1140 Type 2 diabetes mellitus with diabetic neuropathy, unspecified: Secondary | ICD-10-CM | POA: Diagnosis not present

## 2018-03-11 DIAGNOSIS — L97421 Non-pressure chronic ulcer of left heel and midfoot limited to breakdown of skin: Secondary | ICD-10-CM | POA: Diagnosis not present

## 2018-03-11 DIAGNOSIS — I10 Essential (primary) hypertension: Secondary | ICD-10-CM | POA: Diagnosis not present

## 2018-03-11 DIAGNOSIS — M869 Osteomyelitis, unspecified: Secondary | ICD-10-CM | POA: Diagnosis not present

## 2018-03-11 DIAGNOSIS — F329 Major depressive disorder, single episode, unspecified: Secondary | ICD-10-CM | POA: Diagnosis present

## 2018-03-11 DIAGNOSIS — E785 Hyperlipidemia, unspecified: Secondary | ICD-10-CM | POA: Diagnosis not present

## 2018-03-11 DIAGNOSIS — E1142 Type 2 diabetes mellitus with diabetic polyneuropathy: Secondary | ICD-10-CM | POA: Diagnosis not present

## 2018-03-11 DIAGNOSIS — L97509 Non-pressure chronic ulcer of other part of unspecified foot with unspecified severity: Secondary | ICD-10-CM | POA: Diagnosis not present

## 2018-03-11 DIAGNOSIS — M19072 Primary osteoarthritis, left ankle and foot: Secondary | ICD-10-CM | POA: Diagnosis not present

## 2018-03-11 DIAGNOSIS — L97529 Non-pressure chronic ulcer of other part of left foot with unspecified severity: Secondary | ICD-10-CM | POA: Diagnosis present

## 2018-03-11 DIAGNOSIS — J449 Chronic obstructive pulmonary disease, unspecified: Secondary | ICD-10-CM | POA: Diagnosis present

## 2018-03-11 DIAGNOSIS — Z4889 Encounter for other specified surgical aftercare: Secondary | ICD-10-CM | POA: Diagnosis not present

## 2018-03-11 DIAGNOSIS — E876 Hypokalemia: Secondary | ICD-10-CM | POA: Diagnosis present

## 2018-03-11 DIAGNOSIS — E11610 Type 2 diabetes mellitus with diabetic neuropathic arthropathy: Secondary | ICD-10-CM | POA: Diagnosis present

## 2018-03-11 DIAGNOSIS — Z794 Long term (current) use of insulin: Secondary | ICD-10-CM | POA: Diagnosis not present

## 2018-03-11 DIAGNOSIS — K819 Cholecystitis, unspecified: Secondary | ICD-10-CM | POA: Diagnosis not present

## 2018-03-11 DIAGNOSIS — Z91040 Latex allergy status: Secondary | ICD-10-CM

## 2018-03-11 DIAGNOSIS — G4733 Obstructive sleep apnea (adult) (pediatric): Secondary | ICD-10-CM | POA: Diagnosis present

## 2018-03-11 DIAGNOSIS — E8770 Fluid overload, unspecified: Secondary | ICD-10-CM | POA: Diagnosis present

## 2018-03-11 DIAGNOSIS — X58XXXD Exposure to other specified factors, subsequent encounter: Secondary | ICD-10-CM | POA: Diagnosis not present

## 2018-03-11 DIAGNOSIS — Z5181 Encounter for therapeutic drug level monitoring: Secondary | ICD-10-CM | POA: Diagnosis not present

## 2018-03-11 DIAGNOSIS — E1169 Type 2 diabetes mellitus with other specified complication: Secondary | ICD-10-CM | POA: Diagnosis not present

## 2018-03-11 DIAGNOSIS — M86172 Other acute osteomyelitis, left ankle and foot: Secondary | ICD-10-CM | POA: Diagnosis not present

## 2018-03-11 DIAGNOSIS — R600 Localized edema: Secondary | ICD-10-CM | POA: Diagnosis not present

## 2018-03-11 DIAGNOSIS — Z8249 Family history of ischemic heart disease and other diseases of the circulatory system: Secondary | ICD-10-CM | POA: Diagnosis not present

## 2018-03-11 DIAGNOSIS — E871 Hypo-osmolality and hyponatremia: Secondary | ICD-10-CM | POA: Diagnosis present

## 2018-03-11 DIAGNOSIS — Z6836 Body mass index (BMI) 36.0-36.9, adult: Secondary | ICD-10-CM | POA: Diagnosis not present

## 2018-03-11 DIAGNOSIS — Z8261 Family history of arthritis: Secondary | ICD-10-CM

## 2018-03-11 DIAGNOSIS — E1121 Type 2 diabetes mellitus with diabetic nephropathy: Secondary | ICD-10-CM | POA: Diagnosis present

## 2018-03-11 DIAGNOSIS — D649 Anemia, unspecified: Secondary | ICD-10-CM | POA: Diagnosis not present

## 2018-03-11 DIAGNOSIS — E11628 Type 2 diabetes mellitus with other skin complications: Secondary | ICD-10-CM | POA: Diagnosis not present

## 2018-03-11 DIAGNOSIS — Z8051 Family history of malignant neoplasm of kidney: Secondary | ICD-10-CM

## 2018-03-11 DIAGNOSIS — Z801 Family history of malignant neoplasm of trachea, bronchus and lung: Secondary | ICD-10-CM | POA: Diagnosis not present

## 2018-03-11 DIAGNOSIS — K219 Gastro-esophageal reflux disease without esophagitis: Secondary | ICD-10-CM | POA: Diagnosis present

## 2018-03-11 DIAGNOSIS — Z91012 Allergy to eggs: Secondary | ICD-10-CM

## 2018-03-11 DIAGNOSIS — Z452 Encounter for adjustment and management of vascular access device: Secondary | ICD-10-CM | POA: Diagnosis not present

## 2018-03-11 DIAGNOSIS — L97524 Non-pressure chronic ulcer of other part of left foot with necrosis of bone: Secondary | ICD-10-CM | POA: Diagnosis not present

## 2018-03-11 DIAGNOSIS — E781 Pure hyperglyceridemia: Secondary | ICD-10-CM | POA: Diagnosis not present

## 2018-03-11 DIAGNOSIS — L03116 Cellulitis of left lower limb: Secondary | ICD-10-CM | POA: Diagnosis not present

## 2018-03-11 DIAGNOSIS — S86822D Laceration of other muscle(s) and tendon(s) at lower leg level, left leg, subsequent encounter: Secondary | ICD-10-CM | POA: Diagnosis not present

## 2018-03-11 DIAGNOSIS — M25472 Effusion, left ankle: Secondary | ICD-10-CM | POA: Diagnosis not present

## 2018-03-11 DIAGNOSIS — E11621 Type 2 diabetes mellitus with foot ulcer: Secondary | ICD-10-CM

## 2018-03-11 DIAGNOSIS — M7752 Other enthesopathy of left foot: Secondary | ICD-10-CM | POA: Diagnosis not present

## 2018-03-11 DIAGNOSIS — Z792 Long term (current) use of antibiotics: Secondary | ICD-10-CM | POA: Diagnosis not present

## 2018-03-11 DIAGNOSIS — M79672 Pain in left foot: Secondary | ICD-10-CM | POA: Diagnosis not present

## 2018-03-11 DIAGNOSIS — M86672 Other chronic osteomyelitis, left ankle and foot: Secondary | ICD-10-CM | POA: Diagnosis not present

## 2018-03-11 DIAGNOSIS — Z9889 Other specified postprocedural states: Secondary | ICD-10-CM

## 2018-03-11 LAB — COMPREHENSIVE METABOLIC PANEL
ALT: 17 U/L (ref 0–44)
AST: 17 U/L (ref 15–41)
Albumin: 3.8 g/dL (ref 3.5–5.0)
Alkaline Phosphatase: 73 U/L (ref 38–126)
Anion gap: 10 (ref 5–15)
BUN: 13 mg/dL (ref 6–20)
CALCIUM: 10.1 mg/dL (ref 8.9–10.3)
CO2: 23 mmol/L (ref 22–32)
CREATININE: 0.87 mg/dL (ref 0.44–1.00)
Chloride: 103 mmol/L (ref 98–111)
GFR calc non Af Amer: >60 mL/min (ref >60)
Glucose, Bld: 101 mg/dL — ABNORMAL HIGH (ref 70–99)
Potassium: 4 mmol/L (ref 3.5–5.1)
SODIUM: 136 mmol/L (ref 135–145)
Total Bilirubin: 0.4 mg/dL (ref 0.3–1.2)
Total Protein: 7.7 g/dL (ref 6.5–8.1)

## 2018-03-11 LAB — POCT INFLUENZA A/B
INFLUENZA A, POC: NEGATIVE
INFLUENZA B, POC: NEGATIVE

## 2018-03-11 LAB — CBC WITH DIFFERENTIAL/PLATELET
Abs Immature Granulocytes: 0 10*3/uL (ref 0.0–0.1)
BASOS PCT: 1 %
Basophils Absolute: 0.1 10*3/uL (ref 0.0–0.1)
Eosinophils Absolute: 0.1 10*3/uL (ref 0.0–0.7)
Eosinophils Relative: 2 %
HCT: 42.3 % (ref 36.0–46.0)
Hemoglobin: 12.8 g/dL (ref 12.0–15.0)
Immature Granulocytes: 0 %
Lymphocytes Relative: 31 %
Lymphs Abs: 2.1 10*3/uL (ref 0.7–4.0)
MCH: 24 pg — ABNORMAL LOW (ref 26.0–34.0)
MCHC: 30.3 g/dL (ref 30.0–36.0)
MCV: 79.2 fL (ref 78.0–100.0)
MONOS PCT: 10 %
Monocytes Absolute: 0.7 10*3/uL (ref 0.1–1.0)
Neutro Abs: 3.9 10*3/uL (ref 1.7–7.7)
Neutrophils Relative %: 56 %
PLATELETS: 343 10*3/uL (ref 150–400)
RBC: 5.34 MIL/uL — ABNORMAL HIGH (ref 3.87–5.11)
RDW: 15 % (ref 11.5–15.5)
WBC: 6.9 10*3/uL (ref 4.0–10.5)

## 2018-03-11 LAB — I-STAT CG4 LACTIC ACID, ED
LACTIC ACID, VENOUS: 1.15 mmol/L (ref 0.5–1.9)
Lactic Acid, Venous: 1.42 mmol/L (ref 0.5–1.9)

## 2018-03-11 MED ORDER — ENOXAPARIN SODIUM 40 MG/0.4ML ~~LOC~~ SOLN
40.0000 mg | Freq: Every day | SUBCUTANEOUS | Status: DC
  Administered 2018-03-12 – 2018-03-16 (×5): 40 mg via SUBCUTANEOUS
  Filled 2018-03-11 (×5): qty 0.4

## 2018-03-11 MED ORDER — ASPIRIN EC 325 MG PO TBEC
325.0000 mg | DELAYED_RELEASE_TABLET | Freq: Every day | ORAL | Status: DC
  Administered 2018-03-12 – 2018-03-17 (×6): 325 mg via ORAL
  Filled 2018-03-11 (×6): qty 1

## 2018-03-11 MED ORDER — METRONIDAZOLE IN NACL 5-0.79 MG/ML-% IV SOLN
500.0000 mg | Freq: Three times a day (TID) | INTRAVENOUS | Status: DC
  Administered 2018-03-11 – 2018-03-15 (×12): 500 mg via INTRAVENOUS
  Filled 2018-03-11 (×13): qty 100

## 2018-03-11 MED ORDER — GABAPENTIN 400 MG PO CAPS
800.0000 mg | ORAL_CAPSULE | Freq: Three times a day (TID) | ORAL | Status: DC
  Administered 2018-03-11 – 2018-03-17 (×16): 800 mg via ORAL
  Filled 2018-03-11 (×16): qty 2

## 2018-03-11 MED ORDER — ONDANSETRON HCL 4 MG PO TABS: 4.0000 mg | ORAL_TABLET | Freq: Four times a day (QID) | ORAL | Status: DC | PRN

## 2018-03-11 MED ORDER — INSULIN GLARGINE 100 UNIT/ML ~~LOC~~ SOLN
20.0000 [IU] | Freq: Every day | SUBCUTANEOUS | Status: DC
  Administered 2018-03-12 – 2018-03-16 (×5): 20 [IU] via SUBCUTANEOUS
  Filled 2018-03-11 (×6): qty 0.2

## 2018-03-11 MED ORDER — INSULIN DEGLUDEC 200 UNIT/ML ~~LOC~~ SOPN: 20.0000 [IU] | PEN_INJECTOR | Freq: Every day | SUBCUTANEOUS | Status: DC

## 2018-03-11 MED ORDER — HYDROCHLOROTHIAZIDE 25 MG PO TABS
12.5000 mg | ORAL_TABLET | Freq: Every day | ORAL | Status: DC
  Administered 2018-03-12 – 2018-03-17 (×6): 12.5 mg via ORAL
  Filled 2018-03-11 (×6): qty 1

## 2018-03-11 MED ORDER — ACETAMINOPHEN 325 MG PO TABS
650.0000 mg | ORAL_TABLET | Freq: Four times a day (QID) | ORAL | Status: DC | PRN
  Administered 2018-03-11 – 2018-03-17 (×5): 650 mg via ORAL
  Filled 2018-03-11 (×6): qty 2

## 2018-03-11 MED ORDER — VANCOMYCIN HCL IN DEXTROSE 1-5 GM/200ML-% IV SOLN
1000.0000 mg | Freq: Once | INTRAVENOUS | Status: AC
  Administered 2018-03-12: 1000 mg via INTRAVENOUS
  Filled 2018-03-11: qty 200

## 2018-03-11 MED ORDER — ONDANSETRON HCL 4 MG/2ML IJ SOLN: 4.0000 mg | Freq: Four times a day (QID) | INTRAMUSCULAR | Status: DC | PRN

## 2018-03-11 MED ORDER — SODIUM CHLORIDE 0.9 % IV SOLN
2.0000 g | Freq: Two times a day (BID) | INTRAVENOUS | Status: DC
  Administered 2018-03-12 – 2018-03-15 (×7): 2 g via INTRAVENOUS
  Filled 2018-03-11 (×8): qty 2

## 2018-03-11 MED ORDER — ACETAMINOPHEN 650 MG RE SUPP: 650.0000 mg | Freq: Four times a day (QID) | RECTAL | Status: DC | PRN

## 2018-03-11 MED ORDER — SODIUM CHLORIDE 0.9 % IV SOLN
2.0000 g | Freq: Once | INTRAVENOUS | Status: AC
  Administered 2018-03-11: 2 g via INTRAVENOUS
  Filled 2018-03-11: qty 2

## 2018-03-11 MED ORDER — VANCOMYCIN HCL IN DEXTROSE 1-5 GM/200ML-% IV SOLN
1000.0000 mg | Freq: Two times a day (BID) | INTRAVENOUS | Status: DC
  Administered 2018-03-12 – 2018-03-17 (×10): 1000 mg via INTRAVENOUS
  Filled 2018-03-11 (×11): qty 200

## 2018-03-11 MED ORDER — ASPIRIN EC 325 MG PO TBEC: 325.0000 mg | DELAYED_RELEASE_TABLET | Freq: Every day | ORAL | 0 refills | Status: AC

## 2018-03-11 NOTE — H&P (Signed)
History and Physical    MCKINSEY KEAGLE BPZ:[id number] DOB: June 29, 1955 DOA: 03/11/2018  Referring MD/NP/PA: Keenan Bachelor, MD(resident) PCP: Arnetha Courser, MD  Patient coming from: Home  Chief Complaint: Fever  I have personally briefly reviewed patient's old medical records in [place]   HPI: Olivia Romero is a 62 y.o. female with medical history significant of HTN, HLD, neuropathy, diabetes mellitus type 2; who presents with complaints of fever.  Patient had been being followed Dr. Jacqualyn Posey of podiatry for nonhealing ulcer of her left foot for the last month.  Patient had been noted to have some cellulitis around the wound.  Treated with antibiotics of ciprofloxacin, clindamycin, partial doxycycline course, and had been switched to patient had been switched to Bactrim over the last week.  Over the last few days patient reported chills and fever up to 100 F with achy joint pain, sweats, headache, nausea and generalized malaise.  Notes some brownish discharge when changing her bandages.  Cultures had been obtained of the wound 1 week ago and was noted to have grown Staphylococcus hemolyticus.  She reports following all dressing changes as advised.  She followed up with her podiatrist today regarding symptoms and was advised to be seen by her primary care provider.  In her PCPs office she was found to have a temperature up to 102 F, but was found to be negative for influenza.  Patient denies having any shortness of breath, cough, dysuria, abdominal pain, or diarrhea.  ED Course: On admission to the emergency department patient was noted to be febrile up to 102.4 F, heart rates initially 121, and all other vital signs maintained.  Labs revealed WBC 6.9 and lactic acid 1.42.  X-rays showed signs of osteomyelitis of the lateral base of the left fifth metatarsal.  Patient has been started on empiric antibiotics vancomycin, metronidazole, and cefepime. [facility] called to admit.  Review of  Systems  Constitutional: Positive for chills, diaphoresis, fever and malaise/fatigue.  HENT: Negative for ear discharge and nosebleeds.   Eyes: Negative for photophobia and pain.  Respiratory: Negative for cough and shortness of breath.   Cardiovascular: Positive for leg swelling. Negative for chest pain.  Gastrointestinal: Positive for nausea. Negative for abdominal pain, diarrhea and vomiting.  Genitourinary: Negative for dysuria and frequency.  Musculoskeletal: Positive for joint pain. Negative for falls.  Neurological: Negative for focal weakness and loss of consciousness.  Endo/Heme/Allergies: Negative for polydipsia.  Psychiatric/Behavioral: Negative for memory loss and substance abuse.    Past Medical History:  Diagnosis Date  . Anemia   . Arthritis   . Asthma   . Carpal tunnel syndrome on right   . Colon polyps    adenomatous  . Diabetes mellitus without complication ([place])   . Diverticulosis of colon   . Esophagitis   . GERD (gastroesophageal reflux disease)   . Hemorrhoid    internal  . Hyperlipemia   . Hypertension   . Neuropathy   . Pneumonia   . PONV (postoperative nausea and vomiting)   . Sleep apnea    has had in the past lost 50 pounds and do longer uses cpap  . Stroke-like episode ([place]) 2009   TIA    Past Surgical History:  Procedure Laterality Date  . ABDOMINAL HYSTERECTOMY    . BACK SURGERY  09/02/2015  . CERVICAL FUSION    . CESAREAN SECTION  1986  . COLONOSCOPY N/A 11/30/2012   Procedure: COLONOSCOPY;  Surgeon: Irene Shipper, MD;  Location:  [facility] ENDOSCOPY;  Service: Endoscopy;  Laterality: N/A;  . COLONOSCOPY WITH PROPOFOL N/A 01/03/2016   Procedure: COLONOSCOPY WITH PROPOFOL;  Surgeon: Manya Silvas, MD;  Location: [facility] ENDOSCOPY;  Service: Endoscopy;  Laterality: N/A;  . ESOPHAGOGASTRODUODENOSCOPY (EGD) WITH PROPOFOL N/A 01/14/2015   Procedure: ESOPHAGOGASTRODUODENOSCOPY (EGD) WITH PROPOFOL;  Surgeon: Manya Silvas, MD;  Location: [facility]  ENDOSCOPY;  Service: Endoscopy;  Laterality: N/A;  . LUMBAR WOUND DEBRIDEMENT N/A 10/01/2015   Procedure: LUMBAR WOUND DEBRIDEMENT;  Surgeon: Ashok Pall, MD;  Location: [place] NEURO ORS;  Service: Neurosurgery;  Laterality: N/A;  LUMBAR WOUND DEBRIDEMENT  . SAVORY DILATION N/A 01/14/2015   Procedure: SAVORY DILATION;  Surgeon: Manya Silvas, MD;  Location: [facility] ENDOSCOPY;  Service: Endoscopy;  Laterality: N/A;  . TONSILLECTOMY       reports that she has never smoked. She has never used smokeless tobacco. She reports that she does not drink alcohol or use drugs.  Allergies  Allergen Reactions  . Eggs Or Egg-Derived Products Swelling and Other (See Comments)    Angioedema  . Atorvastatin Other (See Comments)    Liver toxicity  . Flu Virus Vaccine Swelling    Arm swelled (site of injection)  . Latex Itching  . Pravastatin Itching and Rash  . Tape Rash and Other (See Comments)    TAPE PULLS OFF THE SKIN!!! Please use an alternative!!    Family History  Problem Relation Age of Onset  . Lung cancer Father   . Hypertension Father   . Arthritis Father   . Other Mother        hardening of the arteries/renal cell carcenoma  . Hypertension Mother   . Stroke Mother   . Kidney cancer Mother   . Arthritis Brother   . Rheum arthritis Maternal Uncle   . Bladder Cancer Neg Hx     Prior to Admission medications   Medication Sig Start Date End Date Taking? Authorizing Provider  acetaminophen (TYLENOL) 500 MG tablet Take 1,000 mg by mouth every 6 (six) hours as needed for mild pain.  11/30/12  Yes [provider]  albuterol (PROVENTIL HFA;VENTOLIN HFA) 108 (90 Base) MCG/ACT inhaler Inhale 2 puffs into the lungs every 6 (six) hours as needed for wheezing or shortness of breath. 10/31/17  Yes Poulose, Bethel Born, NP  aspirin EC 325 MG tablet Take 1 tablet (325 mg total) by mouth daily. 03/11/18  Yes Sowles, Drue Stager, MD  DULoxetine (CYMBALTA) 60 MG capsule Take 60 mg by mouth at bedtime.  10/31/16  Yes [provider]  fenofibrate (TRICOR) 145 MG tablet Take 145 mg by mouth daily. 12/05/16  Yes [provider]  gabapentin (NEURONTIN) 400 MG capsule Take 800 mg by mouth 3 (three) times daily.    Yes [provider]  glimepiride (AMARYL) 2 MG tablet Take 2 mg by mouth 2 (two) times daily.  10/29/15  Yes [provider]  hydrochlorothiazide (HYDRODIURIL) 12.5 MG tablet Take 12.5 mg by mouth daily.  02/06/18 02/06/19 Yes [provider]  Insulin Degludec (TRESIBA FLEXTOUCH) 200 UNIT/ML SOPN Inject 20 Units into the skin at bedtime.    Yes [provider]  INVOKAMET XR 272 105 6650 MG TB24 Take 2 tablets by mouth at bedtime.  11/01/17  Yes [provider]  Multiple Vitamin (MULTIVITAMIN WITH MINERALS) TABS Take 1 tablet by mouth daily. Reported on 07/21/2015   Yes [provider]  sulfamethoxazole-trimethoprim (BACTRIM DS,SEPTRA DS) 800-160 MG tablet Take 1 tablet by mouth 2 (two) times daily. 03/08/18  Yes Trula Slade, DPM  torsemide (DEMADEX) 20 MG tablet Take 1 tablet (20 mg total) by mouth every other day. 04/23/15  Yes Ashok Norris, MD  Blood Glucose Monitoring Suppl (CONTOUR NEXT MONITOR) w/Device KIT as directed.  06/29/17   [provider]  glucose blood (CONTOUR NEXT TEST) test strip as directed.  06/29/17 06/29/18  [provider]  insulin degludec (TRESIBA FLEXTOUCH) 100 UNIT/ML SOPN FlexTouch Pen Inject 20 Units into the skin at bedtime.    [provider]  pantoprazole (PROTONIX) 40 MG tablet Take 1 tablet (40 mg total) by mouth daily. Patient not taking: Reported on 03/11/2018 11/19/17   Arnetha Courser, MD    Physical Exam:  Constitutional: NAD, calm, comfortable Vitals:   03/11/18 2045 03/11/18 2100 03/11/18 2115 03/11/18 2141  BP: 137/73 (!) 136/120 (!) 140/102 (!) 134/50  Pulse: 79 82 86 91  Resp:    18  Temp:      TempSrc:      SpO2: 99% 98% 100% 98%  Weight:        Height:       Eyes: PERRL, lids and conjunctivae normal ENMT: Mucous membranes are moist. Posterior pharynx clear of any exudate or lesions.Normal dentition.  Neck: normal, supple, no masses, no thyromegaly Respiratory: clear to auscultation bilaterally, no wheezing, no crackles. Normal respiratory effort. No accessory muscle use.  Cardiovascular: Regular rate and rhythm, no murmurs / rubs / gallops. No extremity edema. 2+ pedal pulses. No carotid bruits.  Abdomen: no tenderness, no masses palpated. No hepatosplenomegaly. Bowel sounds positive.  Musculoskeletal: no clubbing / cyanosis. No joint deformity upper and lower extremities. Good ROM, no contractures. Normal muscle tone.  Skin: Swelling with erythema surrounding lateral left foot wound as seen below  Neurologic: CN 2-12 grossly intact. DTR normal. Strength 5/5 in all 4.  Psychiatric: Normal judgment and insight. Alert and oriented x 3. Normal mood.     Labs on Admission: I have personally reviewed following labs and imaging studies  CBC: Recent Labs  Lab 03/05/18 1540 03/11/18 1751  WBC 10.0 6.9  NEUTROABS 5,910 3.9  HGB 12.2 12.8  HCT 38.4 42.3  MCV 76.5* 79.2  PLT 404* 154   Basic Metabolic Panel: Recent Labs  Lab 03/11/18 1751  NA 136  K 4.0  CL 103  CO2 23  GLUCOSE 101*  BUN 13  CREATININE 0.87  CALCIUM 10.1   GFR: Estimated Creatinine Clearance: 66.9 mL/min (by C-G formula based on SCr of 0.87 mg/dL). Liver Function Tests: Recent Labs  Lab 03/11/18 1751  AST 17  ALT 17  ALKPHOS 73  BILITOT 0.4  PROT 7.7  ALBUMIN 3.8   No results for input(s): LIPASE, AMYLASE in the last 168 hours. No results for input(s): AMMONIA in the last 168 hours. Coagulation Profile: No results for input(s): INR, PROTIME in the last 168 hours. Cardiac Enzymes: No results for input(s): CKTOTAL, CKMB, CKMBINDEX, TROPONINI in the last 168 hours. BNP (last 3 results) No results for input(s): PROBNP in the last 8760  hours. HbA1C: No results for input(s): HGBA1C in the last 72 hours. CBG: No results for input(s): GLUCAP in the last 168 hours. Lipid Profile: No results for input(s): CHOL, HDL, LDLCALC, TRIG, CHOLHDL, LDLDIRECT in the last 72 hours. Thyroid Function Tests: No results for input(s): TSH, T4TOTAL, FREET4, T3FREE, THYROIDAB in the last 72 hours. Anemia Panel: No results for input(s): VITAMINB12, FOLATE, FERRITIN, TIBC, IRON, RETICCTPCT in the last 72 hours. Urine analysis:  Component Value Date/Time   COLORURINE YELLOW 12/14/2016 1720   APPEARANCEUR CLEAR 12/14/2016 1720   LABSPEC 1.016 12/14/2016 1720   PHURINE 5.0 12/14/2016 1720   GLUCOSEU NEGATIVE 12/14/2016 1720   HGBUR NEGATIVE 12/14/2016 1720   BILIRUBINUR NEGATIVE 12/14/2016 1720   KETONESUR NEGATIVE 12/14/2016 1720   PROTEINUR NEGATIVE 12/14/2016 1720   NITRITE NEGATIVE 12/14/2016 1720   LEUKOCYTESUR MODERATE (A) 12/14/2016 1720   Sepsis Labs: Recent Results (from the past 240 hour(s))  WOUND CULTURE     Status: None   Collection Time: 03/05/18  3:15 PM  Result Value Ref Range Status   MICRO NUMBER: [id number]  Final   SPECIMEN QUALITY: ADEQUATE  Final   SOURCE: SIDE OF LEFT FOOT  Final   STATUS: FINAL  Final   GRAM STAIN:   Final    No white blood cells seen No epithelial cells seen No organisms seen   ISOLATE 1: Staphylococcus haemolyticus  Final    Comment: Moderate growth of Staphylococcus haemolyticus      Susceptibility   Staphylococcus haemolyticus - AEROBIC CULT, GRAM STAIN POSITIVE 1    VANCOMYCIN <=0.5 Sensitive     CIPROFLOXACIN >=8 Resistant     CLINDAMYCIN >=8 Resistant     LEVOFLOXACIN >=8 Resistant     ERYTHROMYCIN >=8 Resistant     GENTAMICIN <=0.5 Sensitive     OXACILLIN* NR Resistant      * Oxacillin-resistant staphylococci are resistant toall currently available beta-lactam antimicrobialagents including penicillins, beta lactam/beta-lactamase inhibitor combinations, and cephems  withstaphylococcal indications, including Cefazolin.    TETRACYCLINE >=16 Resistant     TRIMETH/SULFA* <=10 Sensitive      * Oxacillin-resistant staphylococci are resistant toall currently available beta-lactam antimicrobialagents including penicillins, beta lactam/beta-lactamase inhibitor combinations, and cephems withstaphylococcal indications, including Cefazolin.Legend:S = Susceptible  I = IntermediateR = Resistant  NS = Not susceptible* = Not tested  NR = Not reported**NN = See antimicrobic comments     Radiological Exams on Admission: Dg Foot 2 Views Left  Result Date: 03/11/2018 CLINICAL DATA:  Wound along the lateral left foot. EXAM: LEFT FOOT - 2 VIEW COMPARISON:  None. FINDINGS: No fracture or dislocation. There is cortical resorption along the lateral margin of the proximal fifth metatarsal. This is consistent with osteomyelitis. No other areas of bone resorption to suggest additional osteomyelitis. Bones are diffusely demineralized. There are prominent dorsal and plantar calcaneal spurs. Soft tissue swelling is seen most focally adjacent to the proximal fifth metatarsal. IMPRESSION: Osteomyelitis of the lateral base of the left fifth metatarsal. Electronically Signed   By: Lajean Manes M.D.   On: 03/11/2018 20:33    2 view x-rays of the left foot: Independently reviewed.  Visualized reabsorption of bone noted of the lateral aspect of the left foot.   Assessment/Plan Osteomyelitis, diabetic foot ulcer: Acute.  Patient presents with fever up to 102.4 F and tachycardia.  Patient had been being followed by podiatry for nonhealing ulcer of the left foot.  Recently treated with antibiotics of Bactrim without improvement of symptoms.  Evaluated by PCP today and noted to be negative for influenza.  Recent wound cultures 10/1 grew out Staphylococcus hemolyticus sensitive to Bactrim and vancomycin.  - Admit to a telemetry bed - Follow-up blood cultures  - Follow-up ESR and CRP - Continue  empiric antibiotics of vancomycin, cefepime, and metronidazole - Wound care consult - Tylenol as needed for fever  - Will need to consult podiatry Dr. Carman Ching in a.m.  Diabetes mellitus type 2 peripheral neuropathy:  Last hemoglobin A1c noted to be 6.9 on 10/1. - Hypoglycemic protocols  - Hold Amaryl and Invokamet - Continue pharmacy substitution for Lantus and gabapentin - CBGs q. AC with sensitive SSI  Essential hypertension - Continue hydrochlorothiazide  Hypertriglyceridemia - Did not continue pharmacy substitution due to possible allergy  OSA on CPAP - CPAP per respiratory therapy     DVT prophylaxis: lovenox  Code Status: Full Family Communication: Discussed plan of care with patient and friends present at bedside Disposition Plan: Likely discharge home 2- 3 days Consults called: None Admission status: inpatient   Norval Morton MD [facility] Pager [phone]   If 7PM-7AM, please contact night-coverage [email or web address] Password [email or web address]  03/11/2018, 9:57 PM

## 2018-03-11 NOTE — Progress Notes (Signed)
Name: Olivia Romero   MRN: [id number]    DOB: Apr 12, 1956   Date:03/11/2018       Progress Note  Subjective  Chief Complaint  Chief Complaint  Patient presents with  . Fever    Onset-Wednesday, last week-has been seeing Podiatrist- had cellulitis and prescribed antibiotic Bactrim  . Chills    Howvever, antibiotic is not helping started feeling worst Saturday- headache, no appetite  . Generalized Body Aches    Weak and very fatigue    HPI  Diabetes type 2 with insulin and diabetic foot ulcer left foot: she has been under the care of Dr. Laurence Spates. Since September she has taken cipro, clindamycin, partial rx of doxycicline and switched to septra DS last week after culture results returned as sensitive to septra. She states she has been keeping area cleaned and dressed, but for the past two days she noticed body aches, high fever . No rhinorrhea. She has headaches and joint aches today. Rapid flu test negative   Patient Active Problem List   Diagnosis Date Noted  . Myalgia due to statin 11/19/2017  . Morbid obesity ([place]) 11/19/2017  . Uncontrolled type 2 diabetes mellitus with hyperglycemia ([place]) 11/01/2017  . B12 deficiency 11/01/2017  . Skin ulcer of right ankle, limited to breakdown of skin ([place]) 12/31/2016  . Diabetic peripheral neuropathy associated with type 2 diabetes mellitus ([place]) 12/13/2015  . Lumbar stenosis with neurogenic claudication 09/02/2015  . 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 Chronic constipation 07/13/2015  . OSA (obstructive sleep apnea) 06/25/2015  . 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/23/2015  . Hypertriglyceridemia 04/23/2015  . Statin intolerance 04/23/2015  . Asthma, mild intermittent 02/01/2015  . Type 2 diabetes mellitus with diabetic nephropathy ([place]) 12/31/2014  . Esophagitis 12/31/2014  . Dysphagia 12/31/2014  . Bilateral carpal tunnel syndrome 12/01/2014  . Benign neoplasm of colon  11/30/2012  . Diverticulosis of colon (without mention of hemorrhage) 11/30/2012  . Essential hypertension, benign 11/28/2012    Past Surgical History:  Procedure Laterality Date  . ABDOMINAL HYSTERECTOMY    . BACK SURGERY  09/02/2015  . CERVICAL FUSION    . CESAREAN SECTION  1986  . COLONOSCOPY N/A 11/30/2012   Procedure: COLONOSCOPY;  Surgeon: Irene Shipper, MD;  Location: [facility] ENDOSCOPY;  Service: Endoscopy;  Laterality: N/A;  . COLONOSCOPY WITH PROPOFOL N/A 01/03/2016   Procedure: COLONOSCOPY WITH PROPOFOL;  Surgeon: Manya Silvas, MD;  Location: [facility] ENDOSCOPY;  Service: Endoscopy;  Laterality: N/A;  . ESOPHAGOGASTRODUODENOSCOPY (EGD) WITH PROPOFOL N/A 01/14/2015   Procedure: ESOPHAGOGASTRODUODENOSCOPY (EGD) WITH PROPOFOL;  Surgeon: Manya Silvas, MD;  Location: [facility] ENDOSCOPY;  Service: Endoscopy;  Laterality: N/A;  . LUMBAR WOUND DEBRIDEMENT N/A 10/01/2015   Procedure: LUMBAR WOUND DEBRIDEMENT;  Surgeon: Ashok Pall, MD;  Location: [place] NEURO ORS;  Service: Neurosurgery;  Laterality: N/A;  LUMBAR WOUND DEBRIDEMENT  . SAVORY DILATION N/A 01/14/2015   Procedure: SAVORY DILATION;  Surgeon: Manya Silvas, MD;  Location: [facility] ENDOSCOPY;  Service: Endoscopy;  Laterality: N/A;  . TONSILLECTOMY      Family History  Problem Relation Age of Onset  . Lung cancer Father   . Hypertension Father   . Arthritis Father   . Other Mother        hardening of the arteries/renal cell carcenoma  . Hypertension Mother   . Stroke Mother   . Kidney cancer Mother   . Arthritis Brother   . Rheum arthritis Maternal Uncle   .  Bladder Cancer Neg Hx     Social History   Socioeconomic History  . Marital status: Single    Spouse name: Not on file  . Number of children: 1  . Years of education: Not on file  . Highest education level: Not on file  Occupational History  . Occupation: histology Engineer, production: [employer]  Social Needs  . Financial resource strain: Not on file  . Food insecurity:     Worry: Not on file    Inability: Not on file  . Transportation needs:    Medical: Not on file    Non-medical: Not on file  Tobacco Use  . Smoking status: Never Smoker  . Smokeless tobacco: Never Used  Substance and Sexual Activity  . Alcohol use: Not Currently  . Drug use: No  . Sexual activity: Not Currently  Lifestyle  . Physical activity:    Days per week: Not on file    Minutes per session: Not on file  . Stress: Not on file  Relationships  . Social connections:    Talks on phone: Not on file    Gets together: Not on file    Attends religious service: Not on file    Active member of club or organization: Not on file    Attends meetings of clubs or organizations: Not on file    Relationship status: Not on file  . Intimate partner violence:    Fear of current or ex partner: Not on file    Emotionally abused: Not on file    Physically abused: Not on file    Forced sexual activity: Not on file  Other Topics Concern  . Not on file  Social History Narrative  . Not on file     Current Outpatient Medications:  .  acetaminophen (TYLENOL) 500 MG tablet, Take 1,000 mg by mouth every 6 (six) hours as needed for mild pain. , Disp: , Rfl:  .  albuterol (PROVENTIL HFA;VENTOLIN HFA) 108 (90 Base) MCG/ACT inhaler, Inhale 2 puffs into the lungs every 6 (six) hours as needed for wheezing or shortness of breath., Disp: 1 Inhaler, Rfl: 3 .  Blood Glucose Monitoring Suppl (CONTOUR NEXT MONITOR) w/Device KIT, Use to check blood sugars daily, Disp: , Rfl:  .  DULoxetine (CYMBALTA) 60 MG capsule, Take 60 mg by mouth at bedtime., Disp: , Rfl:  .  fenofibrate (TRICOR) 145 MG tablet, Take 145 mg by mouth daily., Disp: , Rfl: 10 .  gabapentin (NEURONTIN) 400 MG capsule, Take 400 mg by mouth 6 (six) times daily. Reported on 12/13/2015, Disp: , Rfl:  .  glimepiride (AMARYL) 2 MG tablet, Take 2 mg by mouth 2 (two) times daily. , Disp: , Rfl:  .  glucose blood (CONTOUR NEXT TEST) test strip, Use 3  (three) times daily Use to check blood sugars daily, Disp: , Rfl:  .  hydrochlorothiazide (HYDRODIURIL) 12.5 MG tablet, Take by mouth., Disp: , Rfl:  .  Insulin Degludec 200 UNIT/ML SOPN, Inject 20 Units into the skin daily., Disp: , Rfl:  .  INVOKAMET XR [phone] MG TB24, Take 2 tablets by mouth daily., Disp: , Rfl: 10 .  Multiple Vitamin (MULTIVITAMIN WITH MINERALS) TABS, Take 1 tablet by mouth daily. Reported on 07/21/2015, Disp: , Rfl:  .  pantoprazole (PROTONIX) 40 MG tablet, Take 1 tablet (40 mg total) by mouth daily., Disp: 30 tablet, Rfl: 1 .  sulfamethoxazole-trimethoprim (BACTRIM DS,SEPTRA DS) 800-160 MG tablet, Take 1 tablet by mouth  2 (two) times daily., Disp: 20 tablet, Rfl: 0 .  torsemide (DEMADEX) 20 MG tablet, Take 1 tablet (20 mg total) by mouth every other day., Disp: 15 tablet, Rfl: 2  Current Facility-Administered Medications:  .  albuterol (PROVENTIL) (2.5 MG/3ML) 0.083% nebulizer solution 2.5 mg, 2.5 mg, Nebulization, Once, [place], Bethel Born, NP  Allergies  Allergen Reactions  . Eggs Or Egg-Derived Products Other (See Comments)    Angioedema  . Atorvastatin Other (See Comments)    Liver toxicity  . Flu Virus Vaccine Other (See Comments)  . Latex Itching  . Pravastatin Itching and Rash    I personally reviewed active problem list, medication list, allergies, family history, social history with the patient/caregiver today.   ROS  Ten systems reviewed and is negative except as mentioned in HPI   Objective  Vitals:   03/11/18 1447  BP: (!) 156/92  Pulse: (!) 121  Resp: 20  Temp: (!) 102.4 F (39.1 C)  TempSrc: Oral  SpO2: 94%  Weight: 189 lb (85.7 kg)  Height: 5' (1.524 m)    Body mass index is 36.91 kg/m.  Physical Exam  Constitutional: Patient appears well-developed and well-nourished. Obese  No distress.  HEENT: head atraumatic, normocephalic, pupils equal and reactive to light,neck supple, throat within normal limits Cardiovascular: Normal  rate, regular rhythm and normal heart sounds.  No murmur heard. No BLE edema. Skin: lateral aspect of left foot has a quarter size ulcer with some yellow drainage, mild erythema surrounding the area. No pain secondary to diabetic neuropathy  Pulmonary/Chest: Effort normal and breath sounds normal. No respiratory distress. Abdominal: Soft.  There is no tenderness. Psychiatric: Patient has a normal mood and affect. behavior is normal. Judgment and thought content normal.   Recent Results (from the past 2160 hour(s))  WOUND CULTURE     Status: None   Collection Time: 03/05/18  3:15 PM  Result Value Ref Range   MICRO NUMBER: [id number]    SPECIMEN QUALITY: ADEQUATE    SOURCE: SIDE OF LEFT FOOT    STATUS: FINAL    GRAM STAIN:      No white blood cells seen No epithelial cells seen No organisms seen   ISOLATE 1: Staphylococcus haemolyticus     Comment: Moderate growth of Staphylococcus haemolyticus      Susceptibility   Staphylococcus haemolyticus - AEROBIC CULT, GRAM STAIN POSITIVE 1    VANCOMYCIN <=0.5 Sensitive     CIPROFLOXACIN >=8 Resistant     CLINDAMYCIN >=8 Resistant     LEVOFLOXACIN >=8 Resistant     ERYTHROMYCIN >=8 Resistant     GENTAMICIN <=0.5 Sensitive     OXACILLIN* NR Resistant      * Oxacillin-resistant staphylococci are resistant toall currently available beta-lactam antimicrobialagents including penicillins, beta lactam/beta-lactamase inhibitor combinations, and cephems withstaphylococcal indications, including Cefazolin.    TETRACYCLINE >=16 Resistant     TRIMETH/SULFA* <=10 Sensitive      * Oxacillin-resistant staphylococci are resistant toall currently available beta-lactam antimicrobialagents including penicillins, beta lactam/beta-lactamase inhibitor combinations, and cephems withstaphylococcal indications, including Cefazolin.Legend:S = Susceptible  I = IntermediateR = Resistant  NS = Not susceptible* = Not tested  NR = Not reported**NN = See antimicrobic comments   CBC with Differential     Status: Abnormal   Collection Time: 03/05/18  3:40 PM  Result Value Ref Range   WBC 10.0 3.8 - 10.8 Thousand/uL   RBC 5.02 3.80 - 5.10 Million/uL   Hemoglobin 12.2 11.7 - 15.5 g/dL   HCT 38.4  35.0 - 45.0 %   MCV 76.5 (L) 80.0 - 100.0 fL   MCH 24.3 (L) 27.0 - 33.0 pg   MCHC 31.8 (L) 32.0 - 36.0 g/dL   RDW 14.5 11.0 - 15.0 %   Platelets 404 (H) 140 - 400 Thousand/uL   MPV 11.0 7.5 - 12.5 fL   Neutro Abs 5,910 1,500 - 7,800 cells/uL   Lymphs Abs 2,940 850 - 3,900 cells/uL   WBC mixed population 730 200 - 950 cells/uL   Eosinophils Absolute 340 15 - 500 cells/uL   Basophils Absolute 80 0 - 200 cells/uL   Neutrophils Relative % 59.1 %   Total Lymphocyte 29.4 %   Monocytes Relative 7.3 %   Eosinophils Relative 3.4 %   Basophils Relative 0.8 %  Sedimentation Rate     Status: None   Collection Time: 03/05/18  3:40 PM  Result Value Ref Range   Sed Rate 28 0 - 30 mm/h  C-reactive protein     Status: Abnormal   Collection Time: 03/05/18  3:40 PM  Result Value Ref Range   CRP 11.3 (H) <8.0 mg/L  Hemoglobin A1c     Status: Abnormal   Collection Time: 03/05/18  3:40 PM  Result Value Ref Range   Hgb A1c MFr Bld 6.9 (H) <5.7 % of total Hgb    Comment: For someone without known diabetes, a hemoglobin A1c value of 6.5% or greater indicates that they may have  diabetes and this should be confirmed with a follow-up  test. . For someone with known diabetes, a value <7% indicates  that their diabetes is well controlled and a value  greater than or equal to 7% indicates suboptimal  control. A1c targets should be individualized based on  duration of diabetes, age, comorbid conditions, and  other considerations. . Currently, no consensus exists regarding use of hemoglobin A1c for diagnosis of diabetes for children. .    Mean Plasma Glucose 151 (calc)   eAG (mmol/L) 8.4 (calc)  POCT Influenza A/B     Status: Normal   Collection Time: 03/11/18  2:57 PM  Result  Value Ref Range   Influenza A, POC Negative Negative   Influenza B, POC Negative Negative     PHQ2/9: Depression screen [facility] 2/9 11/19/2017 01/31/2017 01/19/2017 12/28/2016 09/20/2016  Decreased Interest 0 0 0 0 0  Down, Depressed, Hopeless 0 0 0 0 0  PHQ - 2 Score 0 0 0 0 0  Altered sleeping 0 - - - -  Tired, decreased energy 0 - - - -  Change in appetite 0 - - - -  Feeling bad or failure about yourself  0 - - - -  Trouble concentrating 0 - - - -  Moving slowly or fidgety/restless 0 - - - -  Suicidal thoughts 0 - - - -  PHQ-9 Score 0 - - - -  Difficult doing work/chores Not difficult at all - - - -     Fall Risk: Fall Risk  03/11/2018 11/19/2017 01/31/2017 01/19/2017 12/28/2016  Falls in the past year? No No No No No  Number falls in past yr: - - - - -  Injury with Fall? - - - - -  Follow up - - - - -     Functional Status Survey: Is the patient deaf or have difficulty hearing?: No Does the patient have difficulty seeing, even when wearing glasses/contacts?: Yes Does the patient have difficulty concentrating, remembering, or making decisions?: No Does the patient  have difficulty walking or climbing stairs?: Yes Does the patient have difficulty dressing or bathing?: No Does the patient have difficulty doing errands alone such as visiting a doctor's office or shopping?: No    Assessment & Plan    1. Type 2 diabetes mellitus with foot ulcer, with long-term current use of insulin ([place])  - Remove dressing  Spoke to Dr. Shanda Bumps he asked to send patient to [facility] and he will see patient once she is admitted  Spoke to triage nurse at 4:23 and gave the information about patient and the reason she is going to [facility]  2. Fever in adult  - POCT Influenza A/B - negative , infection likely from her foot ulcer since symptoms of body aches and arthralgia likely from high temperature  3. Chills with fever  - POCT Influenza A/B  4. Cellulitis of left lower extremity  Redness in only  surrounding the foot ulcer.

## 2018-03-11 NOTE — Telephone Encounter (Signed)
Pt states seen in office Thursday, bactrim on Thursday. Pt states she is worse, last night and yesterday chills and fever of 99.7, feels really bad, still some swelling in the foot and leg, and wound looks better.

## 2018-03-11 NOTE — Telephone Encounter (Signed)
I informed Dr. Jacqualyn Posey he states the wound did not look bad and she is on the proper medication for the wound, pt should be evaluated by her PCP or go to the ED, to make sure she doesn't have anything else going on. I informed pt of Dr. Leigh Aurora orders. She states, after I hung up she felt like she may need to be checked to see if something else was going on and would contact her PCP.

## 2018-03-11 NOTE — Progress Notes (Signed)
Pharmacy Antibiotic Note  LANA FLAIM is a 62 y.o. female admitted on 03/11/2018 with cellulitis/diabetic foot infection.  Pharmacy has been consulted for vancomycin and cefepime dosing. Metronidazole per MD.  Plan: -Vancomycin 1g x1, then vancomycin 1g q12h -Cefepime 2 g x1, then cefepime 2g q12h -Metronidazole 500 mg q8h per MD  Height: 5' (152.4 cm) Weight: 189 lb (85.7 kg) IBW/kg (Calculated) : 45.5  Temp (24hrs), Avg:99.6 F (37.6 C), Min:98.2 F (36.8 C), Max:102.4 F (39.1 C)  Recent Labs  Lab 03/05/18 1540 03/11/18 1751 03/11/18 1753 03/11/18 2132  WBC 10.0 6.9  --   --   CREATININE  --  0.87  --   --   LATICACIDVEN  --   --  1.42 1.15    Estimated Creatinine Clearance: 66.9 mL/min (by C-G formula based on SCr of 0.87 mg/dL).    Allergies  Allergen Reactions  . Eggs Or Egg-Derived Products Swelling and Other (See Comments)    Angioedema  . Atorvastatin Other (See Comments)    Liver toxicity  . Flu Virus Vaccine Swelling    Arm swelled (site of injection)  . Latex Itching  . Pravastatin Itching and Rash  . Tape Rash and Other (See Comments)    TAPE PULLS OFF THE SKIN!!! Please use an alternative!!    Antimicrobials this admission: Vancomycin 10/7 >>  Cefepime 10/7 >>  Metronidazole 10/7 >>  Dose adjustments this admission: N/A  Microbiology results: pending  Thank you for allowing pharmacy to be a part of this patient's care.  Tyson Babinski 03/11/2018 9:42 PM

## 2018-03-11 NOTE — ED Triage Notes (Signed)
Pt endorses on going wound to left foot that has been treated with antibiotics. Pt states that she began having chills, sweats and fever Saturday. Went to see her pcp today and sent here. Fever of 102 earlier, took 500mg  tylenol 4 hours ago. Now afebrile, VSS.

## 2018-03-11 NOTE — ED Provider Notes (Signed)
[facility] EMERGENCY DEPARTMENT Provider Note   CSN: [id number] Arrival date & time: 03/11/18  1718     History   Chief Complaint Chief Complaint  Patient presents with  . Wound Infection    HPI Olivia Romero is a 62 y.o. female.  HPI   62 year old female with PMH significant for T2DM, history of nonhealing diabetic foot wounds, presents with left lower extremity foot wound.  Patient states that for the last 3 weeks she is been following up with podiatry for nonhealing diabetic foot wound of the left lower extremity.  Patient has been on 3 different rounds of antibiotics including doxycycline, Cipro and clarithromycin and most recently Bactrim started 4 days prior to arrival.  Patient had new onset fever 1 day prior to arrival, T-max 102.  Denies cough congestion runny nose, endorses mild nausea without emesis.  Denies burning on urination or abdominal pain.  Denies any rashes.  Patient with peripheral neuropathy, unable to feel pain in left lower extremity.  Patient with negative flu test at PCP today.  Past Medical History:  Diagnosis Date  . Anemia   . Arthritis   . Asthma   . Carpal tunnel syndrome on right   . Colon polyps    adenomatous  . Diabetes mellitus without complication ([place])   . Diverticulosis of colon   . Esophagitis   . GERD (gastroesophageal reflux disease)   . Hemorrhoid    internal  . Hyperlipemia   . Hypertension   . Neuropathy   . Pneumonia   . PONV (postoperative nausea and vomiting)   . Sleep apnea    has had in the past lost 50 pounds and do longer uses cpap  . Stroke-like episode ([place]) 2009   TIA    Patient Active Problem List   Diagnosis Date Noted  . Osteomyelitis ([place]) 03/11/2018  . Myalgia due to statin 11/19/2017  . Morbid obesity ([place]) 11/19/2017  . Uncontrolled type 2 diabetes mellitus with hyperglycemia ([place]) 11/01/2017  . B12 deficiency 11/01/2017  . Skin ulcer of right ankle, limited to breakdown of skin  ([place]) 12/31/2016  . Diabetic peripheral neuropathy associated with type 2 diabetes mellitus ([place]) 12/13/2015  . Lumbar stenosis with neurogenic claudication 09/02/2015  . 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 ([place]) 07/13/2015  . Chronic constipation 07/13/2015  . OSA (obstructive sleep apnea) 06/25/2015  . 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/23/2015  . Hypertriglyceridemia 04/23/2015  . Statin intolerance 04/23/2015  . Asthma, mild intermittent 02/01/2015  . Type 2 diabetes mellitus with diabetic nephropathy ([place]) 12/31/2014  . Esophagitis 12/31/2014  . Dysphagia 12/31/2014  . Bilateral carpal tunnel syndrome 12/01/2014  . Benign neoplasm of colon 11/30/2012  . Diverticulosis of colon (without mention of hemorrhage) 11/30/2012  . Essential hypertension, benign 11/28/2012    Past Surgical History:  Procedure Laterality Date  . ABDOMINAL HYSTERECTOMY    . BACK SURGERY  09/02/2015  . CERVICAL FUSION    . CESAREAN SECTION  1986  . COLONOSCOPY N/A 11/30/2012   Procedure: COLONOSCOPY;  Surgeon: Irene Shipper, MD;  Location: [facility] ENDOSCOPY;  Service: Endoscopy;  Laterality: N/A;  . COLONOSCOPY WITH PROPOFOL N/A 01/03/2016   Procedure: COLONOSCOPY WITH PROPOFOL;  Surgeon: Manya Silvas, MD;  Location: [facility] ENDOSCOPY;  Service: Endoscopy;  Laterality: N/A;  . ESOPHAGOGASTRODUODENOSCOPY (EGD) WITH PROPOFOL N/A 01/14/2015   Procedure: ESOPHAGOGASTRODUODENOSCOPY (EGD) WITH PROPOFOL;  Surgeon: Manya Silvas, MD;  Location: [facility] ENDOSCOPY;  Service: Endoscopy;  Laterality: N/A;  .  LUMBAR WOUND DEBRIDEMENT N/A 10/01/2015   Procedure: LUMBAR WOUND DEBRIDEMENT;  Surgeon: Ashok Pall, MD;  Location: [facility] NEURO ORS;  Service: Neurosurgery;  Laterality: N/A;  LUMBAR WOUND DEBRIDEMENT  . SAVORY DILATION N/A 01/14/2015   Procedure: SAVORY DILATION;  Surgeon: Manya Silvas, MD;  Location: [facility] ENDOSCOPY;  Service: Endoscopy;  Laterality: N/A;  . TONSILLECTOMY        OB History   None      Home Medications    Prior to Admission medications   Medication Sig Start Date End Date Taking? Authorizing Provider  acetaminophen (TYLENOL) 500 MG tablet Take 1,000 mg by mouth every 6 (six) hours as needed for mild pain.  11/30/12  Yes [provider]  albuterol (PROVENTIL HFA;VENTOLIN HFA) 108 (90 Base) MCG/ACT inhaler Inhale 2 puffs into the lungs every 6 (six) hours as needed for wheezing or shortness of breath. 10/31/17  Yes Poulose, Bethel Born, NP  aspirin EC 325 MG tablet Take 1 tablet (325 mg total) by mouth daily. 03/11/18  Yes Sowles, Drue Stager, MD  DULoxetine (CYMBALTA) 60 MG capsule Take 60 mg by mouth at bedtime. 10/31/16  Yes [provider]  fenofibrate (TRICOR) 145 MG tablet Take 145 mg by mouth daily. 12/05/16  Yes [provider]  gabapentin (NEURONTIN) 400 MG capsule Take 800 mg by mouth 3 (three) times daily.    Yes [provider]  glimepiride (AMARYL) 2 MG tablet Take 2 mg by mouth 2 (two) times daily.  10/29/15  Yes [provider]  hydrochlorothiazide (HYDRODIURIL) 12.5 MG tablet Take 12.5 mg by mouth daily.  02/06/18 02/06/19 Yes [provider]  Insulin Degludec (TRESIBA FLEXTOUCH) 200 UNIT/ML SOPN Inject 20 Units into the skin at bedtime.    Yes [provider]  INVOKAMET XR [phone] MG TB24 Take 2 tablets by mouth at bedtime.  11/01/17  Yes [provider]  Multiple Vitamin (MULTIVITAMIN WITH MINERALS) TABS Take 1 tablet by mouth daily. Reported on 07/21/2015   Yes [provider]  sulfamethoxazole-trimethoprim (BACTRIM DS,SEPTRA DS) 800-160 MG tablet Take 1 tablet by mouth 2 (two) times daily. 03/08/18  Yes Trula Slade, DPM  torsemide (DEMADEX) 20 MG tablet Take 1 tablet (20 mg total) by mouth every other day. 04/23/15  Yes Ashok Norris, MD  Blood Glucose Monitoring Suppl (CONTOUR NEXT MONITOR) w/Device KIT as directed.  06/29/17   [provider]   glucose blood (CONTOUR NEXT TEST) test strip as directed.  06/29/17 06/29/18  [provider]  insulin degludec (TRESIBA FLEXTOUCH) 100 UNIT/ML SOPN FlexTouch Pen Inject 20 Units into the skin at bedtime.    [provider]  pantoprazole (PROTONIX) 40 MG tablet Take 1 tablet (40 mg total) by mouth daily. Patient not taking: Reported on 03/11/2018 11/19/17   Arnetha Courser, MD    Family History Family History  Problem Relation Age of Onset  . Lung cancer Father   . Hypertension Father   . Arthritis Father   . Other Mother        hardening of the arteries/renal cell carcenoma  . Hypertension Mother   . Stroke Mother   . Kidney cancer Mother   . Arthritis Brother   . Rheum arthritis Maternal Uncle   . Bladder Cancer Neg Hx     Social History Social History   Tobacco Use  . Smoking status: Never Smoker  . Smokeless tobacco: Never Used  Substance Use Topics  . Alcohol use: Never    Frequency: Never  .  Drug use: No     Allergies   Eggs or egg-derived products; Atorvastatin; Flu virus vaccine; Latex; Pravastatin; and Tape   Review of Systems Review of Systems  Constitutional: Positive for fever. Negative for chills.  HENT: Negative for ear pain and sore throat.   Eyes: Negative for pain and visual disturbance.  Respiratory: Negative for cough and shortness of breath.   Cardiovascular: Negative for chest pain and palpitations.  Gastrointestinal: Positive for nausea. Negative for abdominal pain and vomiting.  Genitourinary: Negative for dysuria and hematuria.  Musculoskeletal: Negative for arthralgias and back pain.  Skin: Positive for wound. Negative for color change and rash.  Neurological: Negative for seizures and syncope.  All other systems reviewed and are negative.    Physical Exam Updated Vital Signs BP 123/62   Pulse 81   Temp 99.1 F (37.3 C) (Oral)   Resp 16   Ht 5' (1.524 m)   Wt 85.7 kg   SpO2 97%   BMI 36.91 kg/m   Physical  Exam  Constitutional: She appears well-developed and well-nourished. No distress.  HENT:  Head: Normocephalic and atraumatic.  Eyes: Conjunctivae are normal.  Neck: Neck supple.  Cardiovascular: Normal rate and regular rhythm.  No murmur heard. Pulmonary/Chest: Effort normal and breath sounds normal. No respiratory distress.  Abdominal: Soft. There is no tenderness.  Musculoskeletal: She exhibits no edema.       Feet:  Neurological: She is alert.  Skin: Skin is warm and dry.  Psychiatric: She has a normal mood and affect.  Nursing note and vitals reviewed.    ED Treatments / Results  Labs (all labs ordered are listed, but only abnormal results are displayed) Labs Reviewed  COMPREHENSIVE METABOLIC PANEL - Abnormal; Notable for the following components:      Result Value   Glucose, Bld 101 (*)    All other components within normal limits  CBC WITH DIFFERENTIAL/PLATELET - Abnormal; Notable for the following components:   RBC 5.34 (*)    MCH 24.0 (*)    All other components within normal limits  CULTURE, BLOOD (ROUTINE X 2)  CULTURE, BLOOD (ROUTINE X 2)  SEDIMENTATION RATE  C-REACTIVE PROTEIN  PREALBUMIN  HIV ANTIBODY (ROUTINE TESTING W REFLEX)  CBC  BASIC METABOLIC PANEL  I-STAT CG4 LACTIC ACID, ED  I-STAT CG4 LACTIC ACID, ED    EKG None  Radiology Dg Foot 2 Views Left  Result Date: 03/11/2018 CLINICAL DATA:  Wound along the lateral left foot. EXAM: LEFT FOOT - 2 VIEW COMPARISON:  None. FINDINGS: No fracture or dislocation. There is cortical resorption along the lateral margin of the proximal fifth metatarsal. This is consistent with osteomyelitis. No other areas of bone resorption to suggest additional osteomyelitis. Bones are diffusely demineralized. There are prominent dorsal and plantar calcaneal spurs. Soft tissue swelling is seen most focally adjacent to the proximal fifth metatarsal. IMPRESSION: Osteomyelitis of the lateral base of the left fifth metatarsal.  Electronically Signed   By: Lajean Manes M.D.   On: 03/11/2018 20:33    Procedures Procedures (including critical care time)  Medications Ordered in ED Medications  metroNIDAZOLE (FLAGYL) IVPB 500 mg (500 mg Intravenous Transfusing/Transfer 03/11/18 2300)  vancomycin (VANCOCIN) IVPB 1000 mg/200 mL premix (has no administration in time range)  ceFEPIme (MAXIPIME) 2 g in sodium chloride 0.9 % 100 mL IVPB (has no administration in time range)  vancomycin (VANCOCIN) IVPB 1000 mg/200 mL premix (has no administration in time range)  aspirin EC tablet 325 mg (has  no administration in time range)  gabapentin (NEURONTIN) capsule 800 mg (800 mg Oral Given 03/11/18 2355)  enoxaparin (LOVENOX) injection 40 mg (has no administration in time range)  ondansetron (ZOFRAN) tablet 4 mg (has no administration in time range)    Or  ondansetron (ZOFRAN) injection 4 mg (has no administration in time range)  acetaminophen (TYLENOL) tablet 650 mg (650 mg Oral Given 03/11/18 2355)    Or  acetaminophen (TYLENOL) suppository 650 mg ( Rectal See Alternative 03/11/18 2355)  hydrochlorothiazide (HYDRODIURIL) tablet 12.5 mg (has no administration in time range)  insulin glargine (LANTUS) injection 20 Units (has no administration in time range)  ceFEPIme (MAXIPIME) 2 g in sodium chloride 0.9 % 100 mL IVPB (0 g Intravenous Stopped 03/11/18 2245)     Initial Impression / Assessment and Plan / ED Course  I have reviewed the triage vital signs and the nursing notes.  Pertinent labs & imaging results that were available during my care of the patient were reviewed by me and considered in my medical decision making (see chart for details).     62 year old female with PMH significant for T2DM, history of nonhealing diabetic foot wounds, presents with left lower extremity foot wound.  History as above.  Patient with nonhealing diabetic foot wound.  Concern for osteomyelitis versus cellulitis.  No alternate source of infection  found on history or physical exam.  Labs and imaging performed.  WBC 6.9 without left shift.  Lactic acid 1.42.  X-ray reveals signs of osteomyelitis.  Patient with wound culture performed on 03/05/2018 revealing sensitivities to Vanco, Bactrim and gentamicin.  I believe patient could benefit from inpatient hospitalization for IV antibiotics for failure of outpatient management of nonhealing diabetic foot wound now with systemic findings.  Patient started on antibiotics and admitted to hospitalist.  Patient seen in conjunction with my attending Dr. Ashok Cordia, Chauncey Cruel greed with disposition and plan.   Final Clinical Impressions(s) / ED Diagnoses   Final diagnoses:  Cholecystitis    ED Discharge Orders    None       Keenan Bachelor, MD 03/12/18 Sheila Oats    Lajean Saver, MD 03/12/18 1224

## 2018-03-12 ENCOUNTER — Other Ambulatory Visit: Payer: Self-pay

## 2018-03-12 ENCOUNTER — Ambulatory Visit: Payer: BLUE CROSS/BLUE SHIELD | Admitting: Podiatry

## 2018-03-12 ENCOUNTER — Inpatient Hospital Stay (HOSPITAL_COMMUNITY): Payer: Medicare Other

## 2018-03-12 DIAGNOSIS — M869 Osteomyelitis, unspecified: Secondary | ICD-10-CM

## 2018-03-12 DIAGNOSIS — K819 Cholecystitis, unspecified: Secondary | ICD-10-CM

## 2018-03-12 DIAGNOSIS — E1142 Type 2 diabetes mellitus with diabetic polyneuropathy: Secondary | ICD-10-CM

## 2018-03-12 DIAGNOSIS — Z6836 Body mass index (BMI) 36.0-36.9, adult: Secondary | ICD-10-CM

## 2018-03-12 DIAGNOSIS — E1121 Type 2 diabetes mellitus with diabetic nephropathy: Secondary | ICD-10-CM

## 2018-03-12 DIAGNOSIS — L97524 Non-pressure chronic ulcer of other part of left foot with necrosis of bone: Secondary | ICD-10-CM

## 2018-03-12 DIAGNOSIS — E11621 Type 2 diabetes mellitus with foot ulcer: Principal | ICD-10-CM

## 2018-03-12 LAB — GLUCOSE, CAPILLARY: Glucose-Capillary: 253 mg/dL — ABNORMAL HIGH (ref 70–99)

## 2018-03-12 LAB — BASIC METABOLIC PANEL
Anion gap: 10 (ref 5–15)
BUN: 12 mg/dL (ref 6–20)
CHLORIDE: 100 mmol/L (ref 98–111)
CO2: 24 mmol/L (ref 22–32)
Calcium: 9.6 mg/dL (ref 8.9–10.3)
Creatinine, Ser: 0.88 mg/dL (ref 0.44–1.00)
GFR calc Af Amer: >60 mL/min (ref >60)
GFR calc non Af Amer: >60 mL/min (ref >60)
Glucose, Bld: 181 mg/dL — ABNORMAL HIGH (ref 70–99)
POTASSIUM: 3.4 mmol/L — AB (ref 3.5–5.1)
SODIUM: 134 mmol/L — AB (ref 135–145)

## 2018-03-12 LAB — CBC
HCT: 42.8 % (ref 36.0–46.0)
HEMOGLOBIN: 13 g/dL (ref 12.0–15.0)
MCH: 23.9 pg — AB (ref 26.0–34.0)
MCHC: 30.4 g/dL (ref 30.0–36.0)
MCV: 78.5 fL — AB (ref 80.0–100.0)
Platelets: 323 10*3/uL (ref 150–400)
RBC: 5.45 MIL/uL — AB (ref 3.87–5.11)
RDW: 15.2 % (ref 11.5–15.5)
WBC: 8.1 10*3/uL (ref 4.0–10.5)

## 2018-03-12 LAB — HIV ANTIBODY (ROUTINE TESTING W REFLEX): HIV Screen 4th Generation wRfx: NONREACTIVE

## 2018-03-12 LAB — SEDIMENTATION RATE: SED RATE: 24 mm/h — AB (ref 0–22)

## 2018-03-12 LAB — PREALBUMIN: PREALBUMIN: 21.5 mg/dL (ref 18–38)

## 2018-03-12 LAB — C-REACTIVE PROTEIN: CRP: 3.1 mg/dL — ABNORMAL HIGH (ref <1.0)

## 2018-03-12 MED ORDER — MAGNESIUM SULFATE 2 GM/50ML IV SOLN
2.0000 g | Freq: Once | INTRAVENOUS | Status: AC
  Administered 2018-03-12: 2 g via INTRAVENOUS
  Filled 2018-03-12: qty 50

## 2018-03-12 MED ORDER — ADULT MULTIVITAMIN W/MINERALS CH
1.0000 | ORAL_TABLET | Freq: Every day | ORAL | Status: DC
  Administered 2018-03-12 – 2018-03-17 (×6): 1 via ORAL
  Filled 2018-03-12 (×6): qty 1

## 2018-03-12 MED ORDER — JUVEN PO PACK
1.0000 | PACK | Freq: Two times a day (BID) | ORAL | Status: DC
  Administered 2018-03-14 – 2018-03-17 (×7): 1 via ORAL
  Filled 2018-03-12 (×10): qty 1

## 2018-03-12 MED ORDER — DULOXETINE HCL 60 MG PO CPEP
60.0000 mg | ORAL_CAPSULE | Freq: Every day | ORAL | Status: DC
  Administered 2018-03-12 – 2018-03-16 (×5): 60 mg via ORAL
  Filled 2018-03-12 (×5): qty 1

## 2018-03-12 MED ORDER — POTASSIUM CHLORIDE CRYS ER 20 MEQ PO TBCR
40.0000 meq | EXTENDED_RELEASE_TABLET | Freq: Once | ORAL | Status: AC
  Administered 2018-03-12: 40 meq via ORAL
  Filled 2018-03-12: qty 2

## 2018-03-12 NOTE — Consult Note (Signed)
Reason for Consult:  Left foot diabetic ulcer and osteomyelitis Referring Physician:  Dr. Bradd Canary is an 62 y.o. female.  HPI: The patient is a 62 year old female with past medical history significant for diabetes.  She is admitted for a left foot ulcer.  She noted fever yesterday to 102 and was instructed to go to the emergency room by her podiatrist.  I am asked to consult for care of her left foot chronic ulcer and underlying osteomyelitis.  She reports having an ulcer for the last couple of weeks.  She states that she tends to walk on the side of her foot.  Her foot has been rolling over onto its side for the last several years.  She has been in a boot for the last few weeks.  She has a history of osteomyelitis of her right foot.  This required surgery by Dr. Jacqualyn Posey.  She denies any history of Charcot Lelan Pons tooth disease or polio.  She denies any history of stroke.  She reports that an uncle has severely high arched feet and walks on the side of his feet like she does.  She denies ever having had an EMG or nerve conduction study of her lower extremities.  She reports that she has had difficulty walking on the sides of her feet since at least her teenage years.  She is on vancomycin, Flagyl and cefepime.  She had an MRI of her foot back in the summer.  She has only had x-rays since then.  She denies any history of surgery to the left foot.  Past Medical History:  Diagnosis Date  . Anemia   . Arthritis   . Asthma   . Carpal tunnel syndrome on right   . Colon polyps    adenomatous  . Diabetes mellitus without complication ([place])   . Diverticulosis of colon   . Esophagitis   . GERD (gastroesophageal reflux disease)   . Hemorrhoid    internal  . Hyperlipemia   . Hypertension   . Neuropathy   . Pneumonia   . PONV (postoperative nausea and vomiting)   . Sleep apnea    has had in the past lost 50 pounds and do longer uses cpap  . Stroke-like episode ([place]) 2009   TIA    Past  Surgical History:  Procedure Laterality Date  . ABDOMINAL HYSTERECTOMY    . BACK SURGERY  09/02/2015  . CERVICAL FUSION    . CESAREAN SECTION  1986  . COLONOSCOPY N/A 11/30/2012   Procedure: COLONOSCOPY;  Surgeon: Irene Shipper, MD;  Location: [facility] ENDOSCOPY;  Service: Endoscopy;  Laterality: N/A;  . COLONOSCOPY WITH PROPOFOL N/A 01/03/2016   Procedure: COLONOSCOPY WITH PROPOFOL;  Surgeon: Manya Silvas, MD;  Location: [facility] ENDOSCOPY;  Service: Endoscopy;  Laterality: N/A;  . ESOPHAGOGASTRODUODENOSCOPY (EGD) WITH PROPOFOL N/A 01/14/2015   Procedure: ESOPHAGOGASTRODUODENOSCOPY (EGD) WITH PROPOFOL;  Surgeon: Manya Silvas, MD;  Location: [facility] ENDOSCOPY;  Service: Endoscopy;  Laterality: N/A;  . LUMBAR WOUND DEBRIDEMENT N/A 10/01/2015   Procedure: LUMBAR WOUND DEBRIDEMENT;  Surgeon: Ashok Pall, MD;  Location: [facility] NEURO ORS;  Service: Neurosurgery;  Laterality: N/A;  LUMBAR WOUND DEBRIDEMENT  . SAVORY DILATION N/A 01/14/2015   Procedure: SAVORY DILATION;  Surgeon: Manya Silvas, MD;  Location: [facility] ENDOSCOPY;  Service: Endoscopy;  Laterality: N/A;  . TONSILLECTOMY      Family History  Problem Relation Age of Onset  . Lung cancer Father   . Hypertension Father   .  Arthritis Father   . Other Mother        hardening of the arteries/renal cell carcenoma  . Hypertension Mother   . Stroke Mother   . Kidney cancer Mother   . Arthritis Brother   . Rheum arthritis Maternal Uncle   . Bladder Cancer Neg Hx     Social History:  reports that she has never smoked. She has never used smokeless tobacco. She reports that she does not drink alcohol or use drugs.  Allergies:  Allergies  Allergen Reactions  . Eggs Or Egg-Derived Products Swelling and Other (See Comments)    Angioedema  . Atorvastatin Other (See Comments)    Liver toxicity  . Flu Virus Vaccine Swelling    Arm swelled (site of injection)  . Latex Itching  . Pravastatin Itching and Rash  . Tape Rash and Other (See Comments)     TAPE PULLS OFF THE SKIN!!! Please use an alternative!!    Medications: I have reviewed the patient's current medications.  Results for orders placed or performed during the hospital encounter of 03/11/18 (from the past 48 hour(s))  Comprehensive metabolic panel     Status: Abnormal   Collection Time: 03/11/18  5:51 PM  Result Value Ref Range   Sodium 136 135 - 145 mmol/L   Potassium 4.0 3.5 - 5.1 mmol/L   Chloride 103 98 - 111 mmol/L   CO2 23 22 - 32 mmol/L   Glucose, Bld 101 (H) 70 - 99 mg/dL   BUN 13 6 - 20 mg/dL   Creatinine, Ser 0.87 0.44 - 1.00 mg/dL   Calcium 10.1 8.9 - 10.3 mg/dL   Total Protein 7.7 6.5 - 8.1 g/dL   Albumin 3.8 3.5 - 5.0 g/dL   AST 17 15 - 41 U/L   ALT 17 0 - 44 U/L   Alkaline Phosphatase 73 38 - 126 U/L   Total Bilirubin 0.4 0.3 - 1.2 mg/dL   GFR calc non Af Amer >60 >60 mL/min   GFR calc Af Amer >60 >60 mL/min    Comment: (NOTE) The eGFR has been calculated using the CKD EPI equation. This calculation has not been validated in all clinical situations. eGFR's persistently <60 mL/min signify possible Chronic Kidney Disease.    Anion gap 10 5 - 15    Comment: Performed at [facility] [address] [place], [place] [address]  CBC with Differential     Status: Abnormal   Collection Time: 03/11/18  5:51 PM  Result Value Ref Range   WBC 6.9 4.0 - 10.5 K/uL   RBC 5.34 (H) 3.87 - 5.11 MIL/uL   Hemoglobin 12.8 12.0 - 15.0 g/dL   HCT 42.3 36.0 - 46.0 %   MCV 79.2 78.0 - 100.0 fL   MCH 24.0 (L) 26.0 - 34.0 pg   MCHC 30.3 30.0 - 36.0 g/dL   RDW 15.0 11.5 - 15.5 %   Platelets 343 150 - 400 K/uL   Neutrophils Relative % 56 %   Neutro Abs 3.9 1.7 - 7.7 K/uL   Lymphocytes Relative 31 %   Lymphs Abs 2.1 0.7 - 4.0 K/uL   Monocytes Relative 10 %   Monocytes Absolute 0.7 0.1 - 1.0 K/uL   Eosinophils Relative 2 %   Eosinophils Absolute 0.1 0.0 - 0.7 K/uL   Basophils Relative 1 %   Basophils Absolute 0.1 0.0 - 0.1 K/uL   Immature Granulocytes 0 %    Abs Immature Granulocytes 0.0 0.0 - 0.1  K/uL    Comment: Performed at [facility], [place] [address] [place], [place] [address]  I-Stat CG4 Lactic Acid, ED     Status: None   Collection Time: 03/11/18  5:53 PM  Result Value Ref Range   Lactic Acid, Venous 1.42 0.5 - 1.9 mmol/L  I-Stat CG4 Lactic Acid, ED     Status: None   Collection Time: 03/11/18  9:32 PM  Result Value Ref Range   Lactic Acid, Venous 1.15 0.5 - 1.9 mmol/L  Blood culture (routine x 2)     Status: None (Preliminary result)   Collection Time: 03/11/18 10:30 PM  Result Value Ref Range   Specimen Description BLOOD LEFT ANTECUBITAL    Special Requests      BOTTLES DRAWN AEROBIC AND ANAEROBIC Blood Culture adequate volume   Culture      NO GROWTH < 12 HOURS Performed at [facility] [address], [place], [place] [address]    Report Status PENDING   Blood culture (routine x 2)     Status: None (Preliminary result)   Collection Time: 03/11/18 10:35 PM  Result Value Ref Range   Specimen Description BLOOD LEFT HAND    Special Requests      BOTTLES DRAWN AEROBIC AND ANAEROBIC Blood Culture adequate volume   Culture      NO GROWTH < 12 HOURS Performed at [facility], [place] [address], [place], [place] [address]    Report Status PENDING   Prealbumin     Status: None   Collection Time: 03/12/18 12:34 AM  Result Value Ref Range   Prealbumin 21.5 18 - 38 mg/dL    Comment: Performed at [facility] [address] [place], [place] [address]  HIV antibody (Routine Testing)     Status: None   Collection Time: 03/12/18 12:34 AM  Result Value Ref Range   HIV Screen 4th Generation wRfx Non Reactive Non Reactive    Comment: (NOTE) Performed At: [facility] [place], [place] [id number] Rush Farmer MD PX:[id number]   CBC     Status: Abnormal   Collection Time: 03/12/18 12:34 AM  Result Value Ref Range   WBC 8.1 4.0 - 10.5 K/uL   RBC 5.45 (H) 3.87 - 5.11 MIL/uL    Hemoglobin 13.0 12.0 - 15.0 g/dL   HCT 42.8 36.0 - 46.0 %   MCV 78.5 (L) 80.0 - 100.0 fL   MCH 23.9 (L) 26.0 - 34.0 pg   MCHC 30.4 30.0 - 36.0 g/dL   RDW 15.2 11.5 - 15.5 %   Platelets 323 150 - 400 K/uL    Comment: Performed at [facility], [place]. [address], [place], [place] [address]  Basic metabolic panel     Status: Abnormal   Collection Time: 03/12/18 12:34 AM  Result Value Ref Range   Sodium 134 (L) 135 - 145 mmol/L   Potassium 3.4 (L) 3.5 - 5.1 mmol/L   Chloride 100 98 - 111 mmol/L   CO2 24 22 - 32 mmol/L   Glucose, Bld 181 (H) 70 - 99 mg/dL   BUN 12 6 - 20 mg/dL   Creatinine, Ser 0.88 0.44 - 1.00 mg/dL   Calcium 9.6 8.9 - 10.3 mg/dL   GFR calc non Af Amer >60 >60 mL/min   GFR calc Af Amer >60 >60 mL/min    Comment: (NOTE) The eGFR has been calculated using the CKD EPI equation. This calculation has not  been validated in all clinical situations. eGFR's persistently <60 mL/min signify possible Chronic Kidney Disease.    Anion gap 10 5 - 15    Comment: Performed at [facility] [address], [place], [place] [address]  C-reactive protein     Status: Abnormal   Collection Time: 03/12/18  5:06 AM  Result Value Ref Range   CRP 3.1 (H) <1.0 mg/dL    Comment: Performed at [facility] [address], [place], [place] [address]  Sedimentation rate     Status: Abnormal   Collection Time: 03/12/18  5:06 AM  Result Value Ref Range   Sed Rate 24 (H) 0 - 22 mm/hr    Comment: Performed at [facility] [address] [place], [place] [address]    Dg Foot 2 Views Left  Result Date: 03/11/2018 CLINICAL DATA:  Wound along the lateral left foot. EXAM: LEFT FOOT - 2 VIEW COMPARISON:  None. FINDINGS: No fracture or dislocation. There is cortical resorption along the lateral margin of the proximal fifth metatarsal. This is consistent with osteomyelitis. No other areas of bone resorption to suggest additional osteomyelitis. Bones are diffusely demineralized.  There are prominent dorsal and plantar calcaneal spurs. Soft tissue swelling is seen most focally adjacent to the proximal fifth metatarsal. IMPRESSION: Osteomyelitis of the lateral base of the left fifth metatarsal. Electronically Signed   By: Lajean Manes M.D.   On: 03/11/2018 20:33    ROS:  Fever as noted above.  No c/n/v/wt loss.  Drainage from the ulcer for the last couple of weeks. PE:  Blood pressure (!) 112/56, pulse 78, temperature 99.3 F (37.4 C), temperature source Oral, resp. rate 18, height 5' (1.524 m), weight 85.7 kg, SpO2 99 %. Well-nourished well-developed overweight woman in no apparent distress.  Alert and oriented x4.  Mood and affect are normal.  Extraocular motions are intact.  Respirations are unlabored.  Both feet have cavovarus deformities.  I can passively correct her to neutral from a severe varus but not into valgus.  Her heel cord is tight bilaterally.  Healed surgical scar at the right lateral midfoot.  The left foot has an ulcer at the fifth metatarsal base plantarly and laterally that measures approximately 2 cm x 2 cm.  Subcutaneous tissue is evident.  There is serous drainage.  Mild swelling around the ulcer.  Dorsalis pedis and posterior tibial pulses are 1+.  Severely diminished sensibility to light touch throughout both feet.  3 out of 5 strength at the peroneals and tibialis anterior.  5 out of 5 at the posterior tib and gastroc.  The right foot has no evident ulceration.  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  Treatment of the osteomyelitis will require excision of all infected bone.  I believe the best way to do this would be 5th ray amputation.  Unfortunately this will eliminate any eversion strength provided by the peroneus brevis.  If the deep infection can be eradicated and the wound heals then  reconstruction of her foot would be required or at a minimum bracing in an AFO to keep her in a neutral position.  The cause of her progressive cavovarus foot deformity is unclear.  An EMG nerve conduction study could help determine if this is likely to progress further.  The patient declines my offer for surgical treatment.  She  prefers to follow-up with Dr. Jacqualyn Posey or 1 of his partners at [place].  I will sign off.  Wylene Simmer 03/12/2018, 3:50 PM

## 2018-03-12 NOTE — Social Work (Signed)
CSW acknowledging consult for access to medications at discharge.  For medication access please consult RN Case Management.   CSW signing off. Please consult if any additional needs arise.  Judia Arnott H Bentlee [place], LCSWA [place] Clinical Social Work [phone]   

## 2018-03-12 NOTE — Progress Notes (Signed)
Pt stated she can place self on cpap when ready for bed. RT will continue to monitor as needed.

## 2018-03-12 NOTE — Consult Note (Addendum)
Reason for Consult: L foot wound, concern for Osteomyelitis Referring Physician: Irene Pap  Olivia Romero is an 62 y.o. female.  HPI: This is a 62 year old female with a chronic wound to left lower extremity being followed by Dr. Jacqualyn Posey.  Last seen on 10/4 where she was switched from doxycycline to Bactrim for local cellulitis.  Patient states that over the weekend she was starting to have low fevers between 99 and 100, but that by Monday her fever had gone up to 102.  Had intermittent chills during this time as well.  Denies nausea or vomiting.  To her the wound looks about the same but she has a hard time seeing it.  Past Medical History:  Diagnosis Date  . Anemia   . Arthritis   . Asthma   . Carpal tunnel syndrome on right   . Colon polyps    adenomatous  . Diabetes mellitus without complication ([place])   . Diverticulosis of colon   . Esophagitis   . GERD (gastroesophageal reflux disease)   . Hemorrhoid    internal  . Hyperlipemia   . Hypertension   . Neuropathy   . Pneumonia   . PONV (postoperative nausea and vomiting)   . Sleep apnea    has had in the past lost 50 pounds and do longer uses cpap  . Stroke-like episode ([place]) 2009   TIA    Past Surgical History:  Procedure Laterality Date  . ABDOMINAL HYSTERECTOMY    . BACK SURGERY  09/02/2015  . CERVICAL FUSION    . CESAREAN SECTION  1986  . COLONOSCOPY N/A 11/30/2012   Procedure: COLONOSCOPY;  Surgeon: Irene Shipper, MD;  [facility] END[facility]COPY;  Service: Endoscopy;  Laterality: N/A;  . COLONOSCOPY WITH PROPOFOL N/A 01/03/2016   Procedure: COLONOSCOPY WITH PROPOFOL;  Surgeon: Manya Silvas, MD;  Location: [facility] ENDOSCOPY;  Service: Endoscopy;  Laterality: N/A;  . ESOPHAGOGASTRODUODENOSCOPY (EGD) WITH PROPOFOL N/A 01/14/2015   Procedure: ESOPHAGOGASTRODUODENOSCOPY (EGD) WITH PROPOFOL;  Surgeon: Manya Silvas, MD;  Location: [facility] ENDOSCOPY;  Service: Endoscopy;  Laterality: N/A;  . LUMBAR WOUND DEBRIDEMENT N/A  10/01/2015   Procedure: LUMBAR WOUND DEBRIDEMENT;  Surgeon: Ashok Pall, MD;  Location: [facility] NEURO ORS;  Service: Neurosurgery;  Laterality: N/A;  LUMBAR WOUND DEBRIDEMENT  . SAVORY DILATION N/A 01/14/2015   Procedure: SAVORY DILATION;  Surgeon: Manya Silvas, MD;  Location: [facility] ENDOSCOPY;  Service: Endoscopy;  Laterality: N/A;  . TONSILLECTOMY      Family History  Problem Relation Age of Onset  . Lung cancer Father   . Hypertension Father   . Arthritis Father   . Other Mother        hardening of the arteries/renal cell carcenoma  . Hypertension Mother   . Stroke Mother   . Kidney cancer Mother   . Arthritis Brother   . Rheum arthritis Maternal Uncle   . Bladder Cancer Neg Hx     Social History:  reports that she has never smoked. She has never used smokeless tobacco. She reports that she does not drink alcohol or use drugs.  Allergies:  Allergies  Allergen Reactions  . Eggs Or Egg-Derived Products Swelling and Other (See Comments)    Angioedema  . Atorvastatin Other (See Comments)    Liver toxicity  . Flu Virus Vaccine Swelling    Arm swelled (site of injection)  . Latex Itching  . Pravastatin Itching and Rash  . Tape Rash and Other (See Comments)    TAPE  PULLS OFF THE SKIN!!! Please use an alternative!!    Medications: I have reviewed the patient's current medications.  Results for orders placed or performed during the hospital encounter of 03/11/18 (from the past 48 hour(s))  Comprehensive metabolic panel     Status: Abnormal   Collection Time: 03/11/18  5:51 PM  Result Value Ref Range   Sodium 136 135 - 145 mmol/L   Potassium 4.0 3.5 - 5.1 mmol/L   Chloride 103 98 - 111 mmol/L   CO2 23 22 - 32 mmol/L   Glucose, Bld 101 (H) 70 - 99 mg/dL   BUN 13 6 - 20 mg/dL   Creatinine, Ser 0.87 0.44 - 1.00 mg/dL   Calcium 10.1 8.9 - 10.3 mg/dL   Total Protein 7.7 6.5 - 8.1 g/dL   Albumin 3.8 3.5 - 5.0 g/dL   AST 17 15 - 41 U/L   ALT 17 0 - 44 U/L   Alkaline Phosphatase  73 38 - 126 U/L   Total Bilirubin 0.4 0.3 - 1.2 mg/dL   GFR calc non Af Amer >60 >60 mL/min   GFR calc Af Amer >60 >60 mL/min    Comment: (NOTE) The eGFR has been calculated using the CKD EPI equation. This calculation has not been validated in all clinical situations. eGFR's persistently <60 mL/min signify possible Chronic Kidney Disease.    Anion gap 10 5 - 15    Comment: Performed at [facility] [address], [place], [place] [address]  CBC with Differential     Status: Abnormal   Collection Time: 03/11/18  5:51 PM  Result Value Ref Range   WBC 6.9 4.0 - 10.5 K/uL   RBC 5.34 (H) 3.87 - 5.11 MIL/uL   Hemoglobin 12.8 12.0 - 15.0 g/dL   HCT 42.3 36.0 - 46.0 %   MCV 79.2 78.0 - 100.0 fL   MCH 24.0 (L) 26.0 - 34.0 pg   MCHC 30.3 30.0 - 36.0 g/dL   RDW 15.0 11.5 - 15.5 %   Platelets 343 150 - 400 K/uL   Neutrophils Relative % 56 %   Neutro Abs 3.9 1.7 - 7.7 K/uL   Lymphocytes Relative 31 %   Lymphs Abs 2.1 0.7 - 4.0 K/uL   Monocytes Relative 10 %   Monocytes Absolute 0.7 0.1 - 1.0 K/uL   Eosinophils Relative 2 %   Eosinophils Absolute 0.1 0.0 - 0.7 K/uL   Basophils Relative 1 %   Basophils Absolute 0.1 0.0 - 0.1 K/uL   Immature Granulocytes 0 %   Abs Immature Granulocytes 0.0 0.0 - 0.1 K/uL    Comment: Performed at [facility], [place] [address], [place], [place] [address]  I-Stat CG4 Lactic Acid, ED     Status: None   Collection Time: 03/11/18  5:53 PM  Result Value Ref Range   Lactic Acid, Venous 1.42 0.5 - 1.9 mmol/L  I-Stat CG4 Lactic Acid, ED     Status: None   Collection Time: 03/11/18  9:32 PM  Result Value Ref Range   Lactic Acid, Venous 1.15 0.5 - 1.9 mmol/L  Blood culture (routine x 2)     Status: None (Preliminary result)   Collection Time: 03/11/18 10:30 PM  Result Value Ref Range   Specimen Description BLOOD LEFT ANTECUBITAL    Special Requests      BOTTLES DRAWN AEROBIC AND ANAEROBIC Blood Culture adequate volume   Culture      NO GROWTH <  12 HOURS  Performed at [facility], [place] [address] [place], [place] [address]    Report Status PENDING   Blood culture (routine x 2)     Status: None (Preliminary result)   Collection Time: 03/11/18 10:35 PM  Result Value Ref Range   Specimen Description BLOOD LEFT HAND    Special Requests      BOTTLES DRAWN AEROBIC AND ANAEROBIC Blood Culture adequate volume   Culture      NO GROWTH < 12 HOURS Performed at [facility], [place] [address], [place], [place] [address]    Report Status PENDING   Prealbumin     Status: None   Collection Time: 03/12/18 12:34 AM  Result Value Ref Range   Prealbumin 21.5 18 - 38 mg/dL    Comment: Performed at [facility] [address] [place], [place] [address]  HIV antibody (Routine Testing)     Status: None   Collection Time: 03/12/18 12:34 AM  Result Value Ref Range   HIV Screen 4th Generation wRfx Non Reactive Non Reactive    Comment: (NOTE) Performed At: [facility] [place], [place] [id number] Rush Farmer MD AT:[id number]   CBC     Status: Abnormal   Collection Time: 03/12/18 12:34 AM  Result Value Ref Range   WBC 8.1 4.0 - 10.5 K/uL   RBC 5.45 (H) 3.87 - 5.11 MIL/uL   Hemoglobin 13.0 12.0 - 15.0 g/dL   HCT 42.8 36.0 - 46.0 %   MCV 78.5 (L) 80.0 - 100.0 fL   MCH 23.9 (L) 26.0 - 34.0 pg   MCHC 30.4 30.0 - 36.0 g/dL   RDW 15.2 11.5 - 15.5 %   Platelets 323 150 - 400 K/uL    Comment: Performed at [facility], [place]. [address] [place], [place] [address]  Basic metabolic panel     Status: Abnormal   Collection Time: 03/12/18 12:34 AM  Result Value Ref Range   Sodium 134 (L) 135 - 145 mmol/L   Potassium 3.4 (L) 3.5 - 5.1 mmol/L   Chloride 100 98 - 111 mmol/L   CO2 24 22 - 32 mmol/L   Glucose, Bld 181 (H) 70 - 99 mg/dL   BUN 12 6 - 20 mg/dL   Creatinine, Ser 0.88 0.44 - 1.00 mg/dL   Calcium 9.6 8.9 - 10.3 mg/dL   GFR calc non Af Amer >60 >60 mL/min   GFR calc Af Amer >60 >60 mL/min     Comment: (NOTE) The eGFR has been calculated using the CKD EPI equation. This calculation has not been validated in all clinical situations. eGFR's persistently <60 mL/min signify possible Chronic Kidney Disease.    Anion gap 10 5 - 15    Comment: Performed at [facility] [address] [place], [place] [address]  C-reactive protein     Status: Abnormal   Collection Time: 03/12/18  5:06 AM  Result Value Ref Range   CRP 3.1 (H) <1.0 mg/dL    Comment: Performed at [facility] [address] [place], [place] [address]  Sedimentation rate     Status: Abnormal   Collection Time: 03/12/18  5:06 AM  Result Value Ref Range   Sed Rate 24 (H) 0 - 22 mm/hr    Comment: Performed at [facility] [address] [place], [place] [address]    Dg Foot 2 Views Left  Result Date: 03/11/2018 CLINICAL DATA:  Wound along  the lateral left foot. EXAM: LEFT FOOT - 2 VIEW COMPARISON:  None. FINDINGS: No fracture or dislocation. There is cortical resorption along the lateral margin of the proximal fifth metatarsal. This is consistent with osteomyelitis. No other areas of bone resorption to suggest additional osteomyelitis. Bones are diffusely demineralized. There are prominent dorsal and plantar calcaneal spurs. Soft tissue swelling is seen most focally adjacent to the proximal fifth metatarsal. IMPRESSION: Osteomyelitis of the lateral base of the left fifth metatarsal. Electronically Signed   By: Lajean Manes M.D.   On: 03/11/2018 20:33    ROS per HPI Blood pressure (!) 112/56, pulse 78, temperature 99.3 F (37.4 C), temperature source Oral, resp. rate 18, height 5' (1.524 m), weight 85.7 kg, SpO2 99 %. Physical Exam  Vitals:   03/12/18 0653 03/12/18 1351  BP: (!) 126/40 (!) 112/56  Pulse:  78  Resp:  18  Temp:  99.3 F (37.4 C)  SpO2:  99%    Constitutional Well developed. Well nourished.  Vascular Dorsalis pedis pulses palpable 1+ bilaterally. Posterior tibial pulses  palpable 1+ bilaterally. Capillary refill normal to all digits.  No cyanosis or clubbing noted. Pedal hair growth absent.  Neurologic Normal speech. Oriented to person, place, and time. Protective sensation absent. Epicritic light touch severely diminished.  Dermatologic Wound Location: L 5th metatarsal area Wound Base: Mixed Granular/Fibrotic Peri-wound: Reddened, Calloused Exudate: Scant/small amount Purulent exudate Wound Measurements: 2x2 Local warmth erythema.  No ascending cellulitis.   Slight fluctuance but without crepitus.  Orthopedic: No pain to palpation either foot.   Assessment/Plan: Diabetic foot ulcer with infection, concern for possible osteomyelitis; IDSA infection severity moderate -X-rays reviewed.  Osseous erosions noted suggestive of osteomyelitis -Labs reviewed.  White blood cell within normal limits, ESR and CRP only mildly elevated. -MRI reviewed however this was from June.  She would benefit from repeat MRI to evaluate extent of possible osteomyelitis. -At present the wound appears as a moderate infection, though she has had recent fevers. -She will need debridement of the wound with possible bone biopsy.  I do not think that her labs and clinical appearance right now are suggestive of an urgent need for amputation, however MRI would further elucidate extent of infection. -We will plan for incision and drainage with bone biopsy tomorrow pending MRI. -N.p.o. after midnight. -Continue broad-spectrum antibiotics.  Currently on vancomycin and cefepime.  Total floor time: 40 mins.  Greater than 50% of this was spent on counseling and coordination of care  Evelina Bucy 03/12/2018, 6:54 PM

## 2018-03-12 NOTE — Progress Notes (Signed)
Patient placed on CPAP as documented.

## 2018-03-12 NOTE — Progress Notes (Addendum)
PROGRESS NOTE  Olivia Romero NKN:[id number] DOB: 1956-03-20 DOA: 03/11/2018 PCP: Arnetha Courser, MD  HPI/Recap of past 24 hours:  Olivia Romero is a 62 y.o. female with medical history significant of HTN, HLD, neuropathy, diabetes mellitus type 2; who presents with complaints of fever.  Patient had been being followed Dr. Jacqualyn Posey of podiatry for nonhealing ulcer of her left foot for the last month.  Patient had been noted to have some cellulitis around the wound.  Treated with antibiotics of ciprofloxacin, clindamycin, partial doxycycline course, and had been switched to patient had been switched to Bactrim over the last week.  Over the last few days patient reported chills and fever up to 100 F with achy joint pain, sweats, headache, nausea and generalized malaise.  Notes some brownish discharge when changing her bandages.  Cultures had been obtained of the wound 1 week ago and was noted to have grown Staphylococcus hemolyticus.  She reports following all dressing changes as advised.  She followed up with her podiatrist today regarding symptoms and was advised to be seen by her primary care provider.  In her PCPs office she was found to have a temperature up to 102 F, but was found to be negative for influenza.  Patient denies having any shortness of breath, cough, dysuria, abdominal pain, or diarrhea. X-rays showed signs of osteomyelitis of the lateral base of the left fifth metatarsal.  Patient has been started on empiric antibiotics vancomycin, metronidazole, and cefepime. [facility] called to admit.  03/12/2018: Patient seen and examined at her bedside.  States she has no feeling in her foot.  Orthopedic surgery consulted.  Patient declines surgical option and states wants to follow-up With Dr. Jacqualyn Posey her podiatrist.  Assessment/Plan: Principal Problem:   Osteomyelitis ([place]) Active Problems:   Essential hypertension, benign   Type 2 diabetes mellitus with diabetic nephropathy (HCC)  Hypertriglyceridemia   OSA (obstructive sleep apnea)   Diabetic foot ulcer (HCC)  Osteomyelitis, diabetic foot ulcer: Acute.  Patient presents with fever up to 102.4 F and tachycardia.   Followed by podiatry for nonhealing ulcer of the left foot.  Recently treated with antibiotics of Bactrim without improvement of symptoms.   Continue empiric antibiotics of IV vancomycin, cefepime, and IV metronidazole Orthopedic surgery consulted, the patient declines surgical option  Diabetes mellitus type 2 peripheral neuropathy:  - Last hemoglobin A1c noted to be 6.9 on 10/1. - Hold Amaryl and Invokamet - Continue pharmacy substitution for Lantus and gabapentin - CBGs q. AC with sensitive SSI  Mild hypervolemic hyponatremia Na+ 134 Repeat BMP in the am  Mild hypokalemia K+ 3.4 repleted w po kcl  Added 2 gm magnesium once  Essential hypertension - Blood pressures well controlled - Continue hydrochlorothiazide  Hypertriglyceridemia - Did not continue pharmacy substitution due to possible allergy  OSA on CPAP - CPAP per respiratory therapy     Code Status: Full Code  Family Communication: None at bedside  Disposition Plan: Possible dc to home in 1-2 days   Consultants:  Orthopedic surgery  Procedures:  None  Antimicrobials:  IV vancomycin  IV flagyl  IV rocephin  DVT prophylaxis:  SQ lovenox daily   Objective: Vitals:   03/12/18 0100 03/12/18 0622 03/12/18 0653 03/12/18 1351  BP:  (!) 147/128 (!) 126/40 (!) 112/56  Pulse: 78 81  78  Resp: 18 16  18   Temp:  98.6 F (37 C)  99.3 F (37.4 C)  TempSrc:  Oral  Oral  SpO2: 97% 99%  99%  Weight:      Height:        Intake/Output Summary (Last 24 hours) at 03/12/2018 1756 Last data filed at 03/11/2018 2245 Gross per 24 hour  Intake 100 ml  Output -  Net 100 ml   Filed Weights   03/11/18 1726  Weight: 85.7 kg    Exam:  . General: 62 y.o. year-old female well developed well nourished in no acute  distress.  Alert and oriented x3. . Cardiovascular: Regular rate and rhythm with no rubs or gallops.  No thyromegaly or JVD noted.   [name] Respiratory: Clear to auscultation with no wheezes or rales. Good inspiratory effort. . Abdomen: Soft nontender nondistended with normal bowel sounds x4 quadrants. . Psychiatry: Mood is appropriate for condition and setting   Data Reviewed: CBC: Recent Labs  Lab 03/11/18 1751 03/12/18 0034  WBC 6.9 8.1  NEUTROABS 3.9  --   HGB 12.8 13.0  HCT 42.3 42.8  MCV 79.2 78.5*  PLT 343 973   Basic Metabolic Panel: Recent Labs  Lab 03/11/18 1751 03/12/18 0034  NA 136 134*  K 4.0 3.4*  CL 103 100  CO2 23 24  GLUCOSE 101* 181*  BUN 13 12  CREATININE 0.87 0.88  CALCIUM 10.1 9.6   GFR: Estimated Creatinine Clearance: 66.1 mL/min (by C-G formula based on SCr of 0.88 mg/dL). Liver Function Tests: Recent Labs  Lab 03/11/18 1751  AST 17  ALT 17  ALKPHOS 73  BILITOT 0.4  PROT 7.7  ALBUMIN 3.8   No results for input(s): LIPASE, AMYLASE in the last 168 hours. No results for input(s): AMMONIA in the last 168 hours. Coagulation Profile: No results for input(s): INR, PROTIME in the last 168 hours. Cardiac Enzymes: No results for input(s): CKTOTAL, CKMB, CKMBINDEX, TROPONINI in the last 168 hours. BNP (last 3 results) No results for input(s): PROBNP in the last 8760 hours. HbA1C: No results for input(s): HGBA1C in the last 72 hours. CBG: No results for input(s): GLUCAP in the last 168 hours. Lipid Profile: No results for input(s): CHOL, HDL, LDLCALC, TRIG, CHOLHDL, LDLDIRECT in the last 72 hours. Thyroid Function Tests: No results for input(s): TSH, T4TOTAL, FREET4, T3FREE, THYROIDAB in the last 72 hours. Anemia Panel: No results for input(s): VITAMINB12, FOLATE, FERRITIN, TIBC, IRON, RETICCTPCT in the last 72 hours. Urine analysis:    Component Value Date/Time   COLORURINE YELLOW 12/14/2016 1720   APPEARANCEUR CLEAR 12/14/2016 1720    LABSPEC 1.016 12/14/2016 1720   PHURINE 5.0 12/14/2016 1720   GLUCOSEU NEGATIVE 12/14/2016 1720   HGBUR NEGATIVE 12/14/2016 1720   BILIRUBINUR NEGATIVE 12/14/2016 1720   KETONESUR NEGATIVE 12/14/2016 1720   PROTEINUR NEGATIVE 12/14/2016 1720   NITRITE NEGATIVE 12/14/2016 1720   LEUKOCYTESUR MODERATE (A) 12/14/2016 1720   Sepsis Labs: @LABRCNTIP (procalcitonin:4,lacticidven:4)  ) Recent Results (from the past 240 hour(s))  WOUND CULTURE     Status: None   Collection Time: 03/05/18  3:15 PM  Result Value Ref Range Status   MICRO NUMBER: [id number]  Final   SPECIMEN QUALITY: ADEQUATE  Final   SOURCE: SIDE OF LEFT FOOT  Final   STATUS: FINAL  Final   GRAM STAIN:   Final    No white blood cells seen No epithelial cells seen No organisms seen   ISOLATE 1: Staphylococcus haemolyticus  Final    Comment: Moderate growth of Staphylococcus haemolyticus      Susceptibility   Staphylococcus haemolyticus - AEROBIC CULT, GRAM STAIN POSITIVE 1  VANCOMYCIN <=0.5 Sensitive     CIPROFLOXACIN >=8 Resistant     CLINDAMYCIN >=8 Resistant     LEVOFLOXACIN >=8 Resistant     ERYTHROMYCIN >=8 Resistant     GENTAMICIN <=0.5 Sensitive     OXACILLIN* NR Resistant      * Oxacillin-resistant staphylococci are resistant toall currently available beta-lactam antimicrobialagents including penicillins, beta lactam/beta-lactamase inhibitor combinations, and cephems withstaphylococcal indications, including Cefazolin.    TETRACYCLINE >=16 Resistant     TRIMETH/SULFA* <=10 Sensitive      * Oxacillin-resistant staphylococci are resistant toall currently available beta-lactam antimicrobialagents including penicillins, beta lactam/beta-lactamase inhibitor combinations, and cephems withstaphylococcal indications, including Cefazolin.Legend:S = Susceptible  I = IntermediateR = Resistant  NS = Not susceptible* = Not tested  NR = Not reported**NN = See antimicrobic comments  Blood culture (routine x 2)     Status: None  (Preliminary result)   Collection Time: 03/11/18 10:30 PM  Result Value Ref Range Status   Specimen Description BLOOD LEFT ANTECUBITAL  Final   Special Requests   Final    BOTTLES DRAWN AEROBIC AND ANAEROBIC Blood Culture adequate volume   Culture   Final    NO GROWTH < 12 HOURS Performed at [facility], [place] [address] [place], [place] [address]    Report Status PENDING  Incomplete  Blood culture (routine x 2)     Status: None (Preliminary result)   Collection Time: 03/11/18 10:35 PM  Result Value Ref Range Status   Specimen Description BLOOD LEFT HAND  Final   Special Requests   Final    BOTTLES DRAWN AEROBIC AND ANAEROBIC Blood Culture adequate volume   Culture   Final    NO GROWTH < 12 HOURS Performed at [facility], [place] [address] [place], [place] [address]    Report Status PENDING  Incomplete      Studies: Dg Foot 2 Views Left  Result Date: 03/11/2018 CLINICAL DATA:  Wound along the lateral left foot. EXAM: LEFT FOOT - 2 VIEW COMPARISON:  None. FINDINGS: No fracture or dislocation. There is cortical resorption along the lateral margin of the proximal fifth metatarsal. This is consistent with osteomyelitis. No other areas of bone resorption to suggest additional osteomyelitis. Bones are diffusely demineralized. There are prominent dorsal and plantar calcaneal spurs. Soft tissue swelling is seen most focally adjacent to the proximal fifth metatarsal. IMPRESSION: Osteomyelitis of the lateral base of the left fifth metatarsal. Electronically Signed   By: Lajean Manes M.D.   On: 03/11/2018 20:33    Scheduled Meds: . aspirin EC  325 mg Oral Daily  . DULoxetine  60 mg Oral QHS  . enoxaparin (LOVENOX) injection  40 mg Subcutaneous QHS  . gabapentin  800 mg Oral TID  . hydrochlorothiazide  12.5 mg Oral Daily  . insulin glargine  20 Units Subcutaneous QHS  . multivitamin with minerals  1 tablet Oral Daily  . [START ON 03/13/2018] nutrition supplement (JUVEN)  1  packet Oral BID BM    Continuous Infusions: . ceFEPime (MAXIPIME) IV 2 g (03/12/18 0832)  . metronidazole 500 mg (03/12/18 1450)  . vancomycin 1,000 mg (03/12/18 0954)     LOS: 1 day     Kayleen Memos, MD [facility] Pager [phone]  If 7PM-7AM, please contact night-coverage [email or web address] Password [email or web address] 03/12/2018, 5:56 PM

## 2018-03-12 NOTE — Progress Notes (Addendum)
Initial Nutrition Assessment  DOCUMENTATION CODES:   Obesity unspecified  INTERVENTION:   -MVI with minerals daily -1 packet Juven BID, each packet provides 80 calories, 8 grams of carbohydrate, and 14 grams of amino acids; supplement contains CaHMB, glutamine, and arginine, to promote wound healing  NUTRITION DIAGNOSIS:   Increased nutrient needs related to wound healing as evidenced by estimated needs.  GOAL:   Patient will meet greater than or equal to 90% of their needs  MONITOR:   PO intake, Supplement acceptance, Labs, Weight trends, Skin, I & O's  REASON FOR ASSESSMENT:   Malnutrition Screening Tool    ASSESSMENT:   Olivia Romero is a 62 y.o. female with medical history significant of HTN, HLD, neuropathy, diabetes mellitus type 2; who presents with complaints of fever.  Patient had been being followed Dr. Jacqualyn Posey of podiatry for nonhealing ulcer of her left foot for the last month.  Patient had been noted to have some cellulitis around the wound.  Treated with antibiotics of ciprofloxacin, clindamycin, partial doxycycline course, and had been switched to patient had been switched to Bactrim over the last week.  Over the last few days patient reported chills and fever up to 100 F with achy joint pain, sweats, headache, nausea and generalized malaise.  Notes some brownish discharge when changing her bandages.  Cultures had been obtained of the wound 1 week ago and was noted to have grown Staphylococcus hemolyticus.  She reports following all dressing changes as advised.  She followed up with her podiatrist today regarding symptoms and was advised to be seen by her primary care provider.  In her PCPs office she was found to have a temperature up to 102 F, but was found to be negative for influenza.  Patient denies having any shortness of breath, cough, dysuria, abdominal pain, or diarrhea.  Pt admitted with lt DM foot ulcer with acute osteomyelitis.   Per [facility] note,  recommending surgical consult.   Attempted to examine pt x 2. Pt has lunch tray in front of her, talking on phone at time of visit. Pt would not disengage in phone conversation during both attempted visit, so unable to obtain further nutritional history at this time.   Reviewed wt hx; pt has experienced a 4.6% wt loss over the past 6 months, which is not significant for time frame.   Give increased needs for DM foot ulcer, pt would benefit from oral nutrition supplements.  Last Hgb A1c: 6.9 (03/05/18). PTA DM medications are .2 mg glimepiride BID and 20 units insulin degludec q HS   Labs reviewed: Na: 134, K: 3.4, no CBGS available (inpatient orders for glycemic control are 20 units insulin glargine daily).   NUTRITION - FOCUSED PHYSICAL EXAM:    Most Recent Value  Orbital Region  Unable to assess  Upper Arm Region  Unable to assess  Thoracic and Lumbar Region  Unable to assess  Buccal Region  Unable to assess  Temple Region  Unable to assess  Clavicle Bone Region  Unable to assess  Clavicle and Acromion Bone Region  Unable to assess  Scapular Bone Region  Unable to assess  Dorsal Hand  Unable to assess  Patellar Region  Unable to assess  Anterior Thigh Region  Unable to assess  Posterior Calf Region  Unable to assess  Edema (RD Assessment)  Unable to assess  Hair  Unable to assess  Eyes  Unable to assess  Mouth  Unable to assess  Skin  Unable to  assess  Nails  Unable to assess       Diet Order:   Diet Order            Diet heart healthy/carb modified Room service appropriate? Yes; Fluid consistency: Thin  Diet effective now              EDUCATION NEEDS:   No education needs have been identified at this time  Skin:  Skin Assessment: Skin Integrity Issues: Skin Integrity Issues:: Diabetic Ulcer Diabetic Ulcer: lt lateral mid foot with osteomyelitis  Last BM:  03/11/18  Height:   Ht Readings from Last 1 Encounters:  03/11/18 5' (1.524 m)    Weight:   Wt  Readings from Last 1 Encounters:  03/11/18 85.7 kg    Ideal Body Weight:  45.5 kg  BMI:  Body mass index is 36.91 kg/m.  Estimated Nutritional Needs:   Kcal:  1700-1900  Protein:  90-105 grams  Fluid:  >1.7 L    Ladashia Demarinis A. Jimmye Norman, RD, LDN, CDE Pager: [phone] After hours Pager: [phone]

## 2018-03-12 NOTE — Consult Note (Addendum)
[place] Nurse wound consult note Patient evaluated in [place].  No family present. Reason for Consult: Left foot wound Wound type: DFU and osteomyelitis Injury POA: Yes Measurement: Wound is on the left lateral mid-foot.  It measures 1.8 cm x 2.3 cm x unknown depth Wound bed: 100% non-viable slough, creamy color Drainage (amount, consistency, odor) minimal serosanginous on the existing foam dressing Periwound: Erythematous Dressing procedure/placement/frequency: Foam dressing, change every 3 days.  Next change date is 03/14/18.   My number one recommendation, which I discussed with Dr. Aileen Fass, is for surgical consult. Monitor the wound area(s) for worsening of condition such as: Signs/symptoms of infection,  Increase in size,  Development of or worsening of odor, Development of pain, or increased pain at the affected locations.  Notify the medical team if any of these develop.  Thank you for the consult.  Discussed plan of care with the patient and bedside nurse.  [place] nurse will not follow at this time.  Please re-consult the [place] team if needed.  Val Riles, RN, MSN, CWOCN, CNS-BC, pager [phone]

## 2018-03-12 NOTE — Care Management Note (Signed)
Case Management Note  Patient Details  Name: Olivia Romero MRN: [id number] Date of Birth: June 09, 1955  Subjective/Objective:                    Action/Plan:  Will continue to follow for treatment plan, PT recommendations  and  discharge needs.  Expected Discharge Date:                  Expected Discharge Plan:     In-House Referral:     Discharge planning Services  CM Consult  Post Acute Care Choice:  Durable Medical Equipment, Home Health Choice offered to:     DME Arranged:    DME Agency:     HH Arranged:    HH Agency:     Status of Service:  In process, will continue to follow  If discussed at Long Length of Stay Meetings, dates discussed:    Additional Comments:  Marilu Favre, RN 03/12/2018, 10:06 AM

## 2018-03-13 ENCOUNTER — Inpatient Hospital Stay (HOSPITAL_COMMUNITY): Payer: Medicare Other

## 2018-03-13 ENCOUNTER — Inpatient Hospital Stay (HOSPITAL_COMMUNITY): Payer: Medicare Other | Admitting: Anesthesiology

## 2018-03-13 ENCOUNTER — Encounter (HOSPITAL_COMMUNITY)
Admission: EM | Disposition: A | Discharge status: Home Health | Payer: Self-pay | Source: Ambulatory Visit | Attending: Internal Medicine

## 2018-03-13 ENCOUNTER — Encounter (HOSPITAL_COMMUNITY): Payer: Self-pay | Admitting: Certified Registered Nurse Anesthetist

## 2018-03-13 HISTORY — PX: IRRIGATION AND DEBRIDEMENT FOOT: SHX6602

## 2018-03-13 LAB — CBC
HEMATOCRIT: 37.5 % (ref 36.0–46.0)
HEMOGLOBIN: 11.2 g/dL — AB (ref 12.0–15.0)
MCH: 23.8 pg — AB (ref 26.0–34.0)
MCHC: 29.9 g/dL — AB (ref 30.0–36.0)
MCV: 79.8 fL — ABNORMAL LOW (ref 80.0–100.0)
Platelets: 315 10*3/uL (ref 150–400)
RBC: 4.7 MIL/uL (ref 3.87–5.11)
RDW: 15 % (ref 11.5–15.5)
WBC: 6.7 10*3/uL (ref 4.0–10.5)
nRBC: 0 % (ref 0.0–0.2)

## 2018-03-13 LAB — BASIC METABOLIC PANEL
Anion gap: 9 (ref 5–15)
BUN: 18 mg/dL (ref 6–20)
CHLORIDE: 102 mmol/L (ref 98–111)
CO2: 24 mmol/L (ref 22–32)
CREATININE: 0.77 mg/dL (ref 0.44–1.00)
Calcium: 9 mg/dL (ref 8.9–10.3)
GFR calc Af Amer: >60 mL/min (ref >60)
GFR calc non Af Amer: >60 mL/min (ref >60)
Glucose, Bld: 184 mg/dL — ABNORMAL HIGH (ref 70–99)
Potassium: 3.8 mmol/L (ref 3.5–5.1)
Sodium: 135 mmol/L (ref 135–145)

## 2018-03-13 LAB — GLUCOSE, CAPILLARY
GLUCOSE-CAPILLARY: 193 mg/dL — AB (ref 70–99)
Glucose-Capillary: 179 mg/dL — ABNORMAL HIGH (ref 70–99)
Glucose-Capillary: 157 mg/dL — ABNORMAL HIGH (ref 70–99)
Glucose-Capillary: 142 mg/dL — ABNORMAL HIGH (ref 70–99)
Glucose-Capillary: 121 mg/dL — ABNORMAL HIGH (ref 70–99)

## 2018-03-13 LAB — SURGICAL PCR SCREEN
MRSA, PCR: NEGATIVE
Staphylococcus aureus: NEGATIVE

## 2018-03-13 SURGERY — IRRIGATION AND DEBRIDEMENT FOOT
Anesthesia: Monitor Anesthesia Care | Laterality: Left

## 2018-03-13 MED ORDER — ONDANSETRON HCL 4 MG/2ML IJ SOLN
INTRAMUSCULAR | Status: DC | PRN
  Administered 2018-03-13: 4 mg via INTRAVENOUS

## 2018-03-13 MED ORDER — BUPIVACAINE HCL (PF) 0.5 % IJ SOLN
INTRAMUSCULAR | Status: DC | PRN
  Administered 2018-03-13: 30 mL
  Administered 2018-03-13: 10 mL

## 2018-03-13 MED ORDER — SODIUM CHLORIDE 0.9 % IR SOLN
Status: DC | PRN
  Administered 2018-03-13: 3000 mL

## 2018-03-13 MED ORDER — PROPOFOL 10 MG/ML IV BOLUS
INTRAVENOUS | Status: AC
  Filled 2018-03-13: qty 20

## 2018-03-13 MED ORDER — LACTATED RINGERS IV SOLN
INTRAVENOUS | Status: DC
  Administered 2018-03-13 – 2018-03-14 (×2): via INTRAVENOUS

## 2018-03-13 MED ORDER — PROMETHAZINE HCL 25 MG/ML IJ SOLN: 6.2500 mg | INTRAMUSCULAR | Status: DC | PRN

## 2018-03-13 MED ORDER — 0.9 % SODIUM CHLORIDE (POUR BTL) OPTIME
TOPICAL | Status: DC | PRN
  Administered 2018-03-13: 1000 mL

## 2018-03-13 MED ORDER — PROPOFOL 10 MG/ML IV BOLUS
INTRAVENOUS | Status: DC | PRN
  Administered 2018-03-13: 30 mg via INTRAVENOUS
  Administered 2018-03-13 (×6): 10 mg via INTRAVENOUS

## 2018-03-13 MED ORDER — ONDANSETRON HCL 4 MG/2ML IJ SOLN
INTRAMUSCULAR | Status: AC
  Filled 2018-03-13: qty 2

## 2018-03-13 MED ORDER — MIDAZOLAM HCL 2 MG/2ML IJ SOLN
INTRAMUSCULAR | Status: AC
  Filled 2018-03-13: qty 2

## 2018-03-13 MED ORDER — BUPIVACAINE HCL (PF) 0.5 % IJ SOLN
INTRAMUSCULAR | Status: AC
  Filled 2018-03-13: qty 30

## 2018-03-13 MED ORDER — MIDAZOLAM HCL 5 MG/5ML IJ SOLN
INTRAMUSCULAR | Status: DC | PRN
  Administered 2018-03-13: 2 mg via INTRAVENOUS

## 2018-03-13 MED ORDER — FENTANYL CITRATE (PF) 100 MCG/2ML IJ SOLN
INTRAMUSCULAR | Status: DC | PRN
  Administered 2018-03-13: 50 ug via INTRAVENOUS

## 2018-03-13 MED ORDER — VANCOMYCIN HCL 1000 MG IV SOLR: INTRAVENOUS | Status: DC | PRN

## 2018-03-13 MED ORDER — VANCOMYCIN HCL 1000 MG IV SOLR
INTRAVENOUS | Status: AC
  Filled 2018-03-13: qty 1000

## 2018-03-13 MED ORDER — MEPERIDINE HCL 50 MG/ML IJ SOLN: 6.2500 mg | INTRAMUSCULAR | Status: DC | PRN

## 2018-03-13 MED ORDER — FENTANYL CITRATE (PF) 250 MCG/5ML IJ SOLN
INTRAMUSCULAR | Status: AC
  Filled 2018-03-13: qty 5

## 2018-03-13 MED ORDER — MIDAZOLAM HCL 2 MG/2ML IJ SOLN: 0.5000 mg | Freq: Once | INTRAMUSCULAR | Status: DC | PRN

## 2018-03-13 MED ORDER — HYDROMORPHONE HCL 1 MG/ML IJ SOLN: 0.2500 mg | INTRAMUSCULAR | Status: DC | PRN

## 2018-03-13 SURGICAL SUPPLY — 40 items
BANDAGE ACE 4X5 VEL STRL LF (GAUZE/BANDAGES/DRESSINGS) ×2 IMPLANT
BNDG ESMARK 4X9 LF (GAUZE/BANDAGES/DRESSINGS) IMPLANT
BNDG GAUZE ELAST 4 BULKY (GAUZE/BANDAGES/DRESSINGS) ×2 IMPLANT
CHLORAPREP W/TINT 26ML (MISCELLANEOUS) ×2 IMPLANT
CNTNR SPEC C3OZ STD GRAD LEK (MISCELLANEOUS) ×2 IMPLANT
CONT SPEC 3OZ W/LID STRL (MISCELLANEOUS) ×2
COVER SURGICAL LIGHT HANDLE (MISCELLANEOUS) ×2 IMPLANT
COVER WAND RF STERILE (DRAPES) ×2 IMPLANT
CUFF TOURNIQUET SINGLE 18IN (TOURNIQUET CUFF) IMPLANT
CUFF TOURNIQUET SINGLE 34IN LL (TOURNIQUET CUFF) IMPLANT
DRAPE U-SHAPE 47X51 STRL (DRAPES) ×2 IMPLANT
ELECT CAUTERY BLADE 6.4 (BLADE) ×2 IMPLANT
ELECT REM PT RETURN 9FT ADLT (ELECTROSURGICAL) ×2
ELECTRODE REM PT RTRN 9FT ADLT (ELECTROSURGICAL) ×1 IMPLANT
GAUZE SPONGE 4X4 12PLY STRL (GAUZE/BANDAGES/DRESSINGS) IMPLANT
GAUZE SPONGE 4X4 12PLY STRL LF (GAUZE/BANDAGES/DRESSINGS) ×2 IMPLANT
GLOVE BIO SURGEON STRL SZ7.5 (GLOVE) IMPLANT
GLOVE BIOGEL PI IND STRL 8 (GLOVE) ×1 IMPLANT
GLOVE BIOGEL PI INDICATOR 8 (GLOVE) ×1
GOWN STRL REUS W/ TWL LRG LVL3 (GOWN DISPOSABLE) ×1 IMPLANT
GOWN STRL REUS W/ TWL XL LVL3 (GOWN DISPOSABLE) ×1 IMPLANT
GOWN STRL REUS W/TWL LRG LVL3 (GOWN DISPOSABLE) ×1
GOWN STRL REUS W/TWL XL LVL3 (GOWN DISPOSABLE) ×1
KIT BASIN OR (CUSTOM PROCEDURE TRAY) ×2 IMPLANT
KIT TURNOVER KIT B (KITS) ×2 IMPLANT
MANIFOLD NEPTUNE II (INSTRUMENTS) ×2 IMPLANT
NEEDLE BIOPSY JAMSHIDI 8X6 (NEEDLE) ×2 IMPLANT
NEEDLE HYPO 25GX1X1/2 BEV (NEEDLE) IMPLANT
NS IRRIG 1000ML POUR BTL (IV SOLUTION) ×2 IMPLANT
PACK ORTHO EXTREMITY (CUSTOM PROCEDURE TRAY) ×2 IMPLANT
PAD ARMBOARD 7.5X6 YLW CONV (MISCELLANEOUS) ×4 IMPLANT
PROBE DEBRIDE SONICVAC MISONIX (TIP) IMPLANT
SCRUB BETADINE 4OZ XXX (MISCELLANEOUS) ×2 IMPLANT
SET CYSTO W/LG BORE CLAMP LF (SET/KITS/TRAYS/PACK) ×2 IMPLANT
SOL PREP POV-IOD 4OZ 10% (MISCELLANEOUS) ×2 IMPLANT
SYR CONTROL 10ML LL (SYRINGE) IMPLANT
TOWEL GREEN STERILE (TOWEL DISPOSABLE) ×2 IMPLANT
TOWEL GREEN STERILE FF (TOWEL DISPOSABLE) ×2 IMPLANT
TUBE CONNECTING 12X1/4 (SUCTIONS) ×2 IMPLANT
YANKAUER SUCT BULB TIP NO VENT (SUCTIONS) ×2 IMPLANT

## 2018-03-13 NOTE — Anesthesia Postprocedure Evaluation (Signed)
Anesthesia Post Note  Patient: Olivia Romero  Procedure(s) Performed: IRRIGATION AND DEBRIDEMENT FOOT WITH BONE BIOPSY WITH MISONIX DEBRIDER (Left )     Patient location during evaluation: PACU Anesthesia Type: MAC Level of consciousness: sedated, patient cooperative and oriented Pain management: pain level controlled Vital Signs Assessment: post-procedure vital signs reviewed and stable Respiratory status: spontaneous breathing, nonlabored ventilation and respiratory function stable Cardiovascular status: blood pressure returned to baseline and stable Postop Assessment: no apparent nausea or vomiting Anesthetic complications: no    Last Vitals:  Vitals:   03/13/18 1422 03/13/18 1437  BP: (!) 127/53 (!) 104/57  Pulse: 80 72  Resp: 19 14  Temp: 36.5 C   SpO2: 97% 95%    Last Pain:  Vitals:   03/13/18 0925  TempSrc: Oral  PainSc:                  Massa Pe,E. Hamad Whyte

## 2018-03-13 NOTE — Brief Op Note (Signed)
03/11/2018 - 03/13/2018  2:16 PM  PATIENT:  Olivia Romero  62 y.o. female  PRE-OPERATIVE DIAGNOSIS:  Left foot ulcer  POST-OPERATIVE DIAGNOSIS:  Left foot ulcer  PROCEDURE:  Procedure(s): IRRIGATION AND DEBRIDEMENT FOOT WITH BONE BIOPSY WITH MISONIX DEBRIDER (Left)  SURGEON:  Surgeon(s) and Role:    * Evelina Bucy, DPM - Primary  PHYSICIAN ASSISTANT:   ASSISTANTS: none   ANESTHESIA:   local and MAC  EBL:  1 mL   BLOOD ADMINISTERED:none  DRAINS: none   LOCAL MEDICATIONS USED:  MARCAINE    and Amount: 10 ml  SPECIMEN:   ID Type Source Tests Collected by Time Destination  1 : bone biopsy left foot Tissue Soft Tissue, Other SURGICAL PATHOLOGY Evelina Bucy, DPM 03/13/2018 1333      DISPOSITION OF SPECIMEN:  Path and Micro  COUNTS:  YES  TOURNIQUET:  * No tourniquets in log *  DICTATION: .Note written in EPIC  PLAN OF CARE: transfer to floor  PATIENT DISPOSITION:  PACU - hemodynamically stable.   Delay start of Pharmacological VTE agent (>24hrs) due to surgical blood loss or risk of bleeding: not applicable

## 2018-03-13 NOTE — Anesthesia Preprocedure Evaluation (Addendum)
Anesthesia Evaluation  Patient identified by MRN, date of birth, ID band Patient awake    Reviewed: Allergy & Precautions, NPO status , Patient's Chart, lab work & pertinent test results  History of Anesthesia Complications (+) PONV  Airway Mallampati: II  TM Distance: >3 FB Neck ROM: Full    Dental  (+) Dental Advisory Given, Missing   Pulmonary sleep apnea and Continuous Positive Airway Pressure Ventilation , COPD,  COPD inhaler,    breath sounds clear to auscultation       Cardiovascular hypertension, Pt. on medications (-) angina Rhythm:Regular Rate:Normal     Neuro/Psych TIA   GI/Hepatic Neg liver ROS, GERD  Controlled,  Endo/Other  diabetes (glu 179), Oral Hypoglycemic Agents, Insulin DependentMorbid obesity  Renal/GU Renal InsufficiencyRenal disease     Musculoskeletal  (+) Arthritis ,   Abdominal (+) + obese,   Peds  Hematology negative hematology ROS (+)   Anesthesia Other Findings   Reproductive/Obstetrics                            Anesthesia Physical Anesthesia Plan  ASA: III  Anesthesia Plan: MAC   Post-op Pain Management:    Induction:   PONV Risk Score and Plan: 3 and Ondansetron and Treatment may vary due to age or medical condition  Airway Management Planned: Natural Airway and Nasal Cannula  Additional Equipment:   Intra-op Plan:   Post-operative Plan:   Informed Consent: I have reviewed the patients History and Physical, chart, labs and discussed the procedure including the risks, benefits and alternatives for the proposed anesthesia with the patient or authorized representative who has indicated his/her understanding and acceptance.   Dental advisory given  Plan Discussed with: CRNA and Surgeon  Anesthesia Plan Comments: (Plan routine monitors, MAC)        Anesthesia Quick Evaluation

## 2018-03-13 NOTE — Transfer of Care (Signed)
Immediate Anesthesia Transfer of Care Note  Patient: Olivia Romero  Procedure(s) Performed: IRRIGATION AND DEBRIDEMENT FOOT WITH BONE BIOPSY WITH MISONIX DEBRIDER (Left )  Patient Location: PACU  Anesthesia Type:MAC  Level of Consciousness: awake, alert  and oriented  Airway & Oxygen Therapy: Patient Spontanous Breathing and Patient connected to face mask oxygen  Post-op Assessment: Report given to RN and Post -op Vital signs reviewed and stable  Post vital signs: Reviewed and stable  Last Vitals:  Vitals Value Taken Time  BP    Temp    Pulse 80 03/13/2018  2:22 PM  Resp 19 03/13/2018  2:22 PM  SpO2 97 % 03/13/2018  2:22 PM  Vitals shown include unvalidated device data.  Last Pain:  Vitals:   03/13/18 0925  TempSrc: Oral  PainSc:          Complications: No apparent anesthesia complications

## 2018-03-13 NOTE — Progress Notes (Signed)
Subjective:  Patient ID: Olivia Romero, female    DOB: 1956/05/04,  MRN: [id number]  Patient seen in PACU. Doing well today. No overnight events. Understands plan for OR.  Objective:   Vitals:   03/13/18 0650 03/13/18 0925  BP: (!) 138/56 114/63  Pulse: 84 73  Resp: 20   Temp: 98.7 F (37.1 C) 98.3 F (36.8 C)  SpO2: 96% 100%   Results for orders placed or performed during the hospital encounter of 03/11/18 (from the past 24 hour(s))  Glucose, capillary     Status: Abnormal   Collection Time: 03/12/18 10:12 PM  Result Value Ref Range   Glucose-Capillary 253 (H) 70 - 99 mg/dL   Comment 1 Notify RN    Comment 2 Document in Chart   Surgical pcr screen     Status: None   Collection Time: 03/12/18 10:24 PM  Result Value Ref Range   MRSA, PCR NEGATIVE NEGATIVE   Staphylococcus aureus NEGATIVE NEGATIVE  CBC     Status: Abnormal   Collection Time: 03/13/18  3:45 AM  Result Value Ref Range   WBC 6.7 4.0 - 10.5 K/uL   RBC 4.70 3.87 - 5.11 MIL/uL   Hemoglobin 11.2 (L) 12.0 - 15.0 g/dL   HCT 37.5 36.0 - 46.0 %   MCV 79.8 (L) 80.0 - 100.0 fL   MCH 23.8 (L) 26.0 - 34.0 pg   MCHC 29.9 (L) 30.0 - 36.0 g/dL   RDW 15.0 11.5 - 15.5 %   Platelets 315 150 - 400 K/uL   nRBC 0.0 0.0 - 0.2 %  Basic metabolic panel     Status: Abnormal   Collection Time: 03/13/18  3:45 AM  Result Value Ref Range   Sodium 135 135 - 145 mmol/L   Potassium 3.8 3.5 - 5.1 mmol/L   Chloride 102 98 - 111 mmol/L   CO2 24 22 - 32 mmol/L   Glucose, Bld 184 (H) 70 - 99 mg/dL   BUN 18 6 - 20 mg/dL   Creatinine, Ser 0.77 0.44 - 1.00 mg/dL   Calcium 9.0 8.9 - 10.3 mg/dL   GFR calc non Af Amer >60 >60 mL/min   GFR calc Af Amer >60 >60 mL/min   Anion gap 9 5 - 15  Glucose, capillary     Status: Abnormal   Collection Time: 03/13/18  9:47 AM  Result Value Ref Range   Glucose-Capillary 179 (H) 70 - 99 mg/dL  Glucose, capillary     Status: Abnormal   Collection Time: 03/13/18 12:45 PM  Result Value Ref Range   Glucose-Capillary 157 (H) 70 - 99 mg/dL   MRI 10/8: 1. Abnormal localized subcutaneous inflammation below the lateral Lisfranc joint, with notable edema in the adjacent cuboid and metatarsal bases. This has a roughly similar appearance to 11/21/2017 exam and accordingly there is a strong possibility that this is from Charcot arthropathy rather than infection. The involvement of multiple bones also makes Charcot arthropathy more likely. If the patient has drainage from the plantar wound than that might favor chronic osteomyelitis. The MRI findings reflect edema which could be present in either condition. 2. Compared to the June exam, there has been some mild increase in edema in the talar neck and in the proximal dorsal navicular, probably from stress reaction. 3. There is extensive varus angulation at the Chopart joint. 4. Similar finding of high-grade partial tearing of the peroneus brevis tendon without total rupture, and prominent tendinopathy of the peroneus longus tendon. 5. Thickening  of the medial band of the plantar fascia. 6. Mild distal Achilles tendinopathy. 7. Probable torn superomedial portion of the spring ligament. Thickened adjacent tibionavicular and tibiospring portions of the deltoid ligament with adjacent edema.   Constitutional Well developed. Well nourished.  Vascular Dorsalis pedis pulses palpable 1+ bilaterally. Posterior tibial pulses palpable 1+ bilaterally. Capillary refill normal to all digits.  No cyanosis or clubbing noted. Pedal hair growth absent.  Neurologic Normal speech. Oriented to person, place, and time. Protective sensation absent. Epicritic light touch severely diminished.  Dermatologic Dressing intact L Foot  Orthopedic: No pain to palpation either foot.   Assessment & Plan:  Patient was evaluated and treated and all questions answered.  Diabetic Foot Ulcer with Infection -MRI Reviewed. Discussed results with patient. Discussed  that findings could be due to either Charcot or Osteomyelitis, or could be osteomyelitis superimposed on Charcot. Bone biopsy today will help to delineate whether infection is present, with the caveat that a positive culture/path will suggest osteomyelitis however a negative culture could be a false negative 2/2 antibiotics. -Discussed plan for debridement today of the wound with bone biopsy. Patient understands plan and agrees to proceed. -Will continue to follow.

## 2018-03-13 NOTE — Progress Notes (Signed)
RT went into pt room to adjust mask. Pt wasn't ready to go to bed. Pt stated she felt comfortable placing herself on CPAP when ready. RT will continue to monitor as needed.

## 2018-03-13 NOTE — Progress Notes (Signed)
Inpatient Diabetes Program Recommendations  AACE/ADA: New Consensus Statement on Inpatient Glycemic Control (2015)  Target Ranges:  Prepandial:   less than 140 mg/dL      Peak postprandial:   less than 180 mg/dL (1-2 hours)      Critically ill patients:  140 - 180 mg/dL   Results for PAULLA, MCCLASKEY (MRN [id number]) as of 03/13/2018 10:49  Ref. Range 03/12/2018 22:12 03/13/2018 09:47  Glucose-Capillary Latest Ref Range: 70 - 99 mg/dL 253 (H) 179 (H)   Review of Glycemic Control  Diabetes history: DM 2 Outpatient Diabetes medications: Amaryl 2 mg BID, Invokamet [phone] mg BID, Tresiba 20 units qhs Current orders for Inpatient glycemic control: Lantus 20 units qhs  A1c 6.9% on 10/1  Inpatient Diabetes Program Recommendations:    Patient only on basal insulin while here. Consider Novolog Correction scale 0-15 units tid and Novolog 5 units qhs while here.  Thanks,  Tama Headings RN, MSN, BC-ADM Inpatient Diabetes Coordinator Team Pager [phone] (8a-5p)

## 2018-03-13 NOTE — Progress Notes (Signed)
PROGRESS NOTE    Olivia Romero  AUQ:[id number] DOB: 08-Apr-1956 DOA: 03/11/2018 PCP: Arnetha Courser, MD   Brief Narrative:  62 year old with past medical history relevant for depression/anxiety, type 2 diabetes on insulin, hypertension, diabetic neuropathy, COPD who comes in with fevers and chills due to concern for infected diabetic foot ulcer and possible osteomyelitis.   Assessment & Plan:   Principal Problem:   Osteomyelitis ([place]) Active Problems:   Essential hypertension, benign   Type 2 diabetes mellitus with diabetic nephropathy (HCC)   Hypertriglyceridemia   OSA (obstructive sleep apnea)   Diabetic polyneuropathy associated with type 2 diabetes mellitus (HCC)   Diabetic foot ulcer (HCC)   BMI 36.0-36.9,adult   #)diabetic foot ulcer/possible osteo-: Patient presented with subjective chills in the setting of being treated for diabetic foot ulcer.  MRI did show evidence of edema in the cuboid and metatarsal bases although this was felt to be similar to prior exam suggesting Charcot arthropathy. - Dr. March Rummage, podiatry, is taking patient to the OR -Continue IV cefepime, metronidazole, vancomycin started 03/11/2018 - Blood cultures from 03/11/2018 no growth to date  #) Type 2 diabetes: -Sliding scale insulin, SHS - Continue insulin glargine 20 units nightly - Carb restricted diet  #) Hypertension: - Continue HCTZ 12.5 mg daily -Continue aspirin 325   #) Pain/psych: -Continue duloxetine 60 mg nightly -Continue gabapentin 800 mg 3 times daily  Fluids: Tolerating p.o. Elect lites: Monitor and supplement Nutrition: Carb restricted diet  Prophylaxis: Enoxaparin  Disposition: Pending antibiotic plan and surgery  Full code      Consultants:   Podiatry  Procedures: )  03/13/2018: Debridement and bone biopsy in the OR  Antimicrobials:  IV cefepime, metronidazole, vancomycin   Subjective: Patient reports she is doing fairly well.  She reports that her  chills are completely gone.  She denies any nausea, vomiting, diarrhea, cough, congestion.  Objective: Vitals:   03/13/18 1437 03/13/18 1452 03/13/18 1501 03/13/18 1536  BP: (!) 104/57 (!) 114/54 (!) 116/57 (!) 111/53  Pulse: 72 65 70 67  Resp: 14 12 14    Temp:  98.1 F (36.7 C)  97.8 F (36.6 C)  TempSrc:    Oral  SpO2: 95% 94% 94% 94%  Weight:      Height:        Intake/Output Summary (Last 24 hours) at 03/13/2018 1604 Last data filed at 03/13/2018 1447 Gross per 24 hour  Intake 1120 ml  Output 1 ml  Net 1119 ml   Filed Weights   03/11/18 1726  Weight: 85.7 kg    Examination:  General exam: Appears calm and comfortable  Respiratory system: Clear to auscultation. Respiratory effort normal. Cardiovascular system: Regular rate and rhythm, no murmurs Gastrointestinal system: Abdomen is nondistended, soft and nontender. No organomegaly or masses felt. Normal bowel sounds heard. Central nervous system: Grossly intact, moving all extremities Extremities: Trace lower extremity edema r. Skin: Left lower extremity is currently dressed Psychiatry: Judgement and insight appear normal. Mood & affect appropriate.     Data Reviewed:  CBC: Recent Labs  Lab 03/11/18 1751 03/12/18 0034 03/13/18 0345  WBC 6.9 8.1 6.7  NEUTROABS 3.9  --   --   HGB 12.8 13.0 11.2*  HCT 42.3 42.8 37.5  MCV 79.2 78.5* 79.8*  PLT 343 323 638   Basic Metabolic Panel: Recent Labs  Lab 03/11/18 1751 03/12/18 0034 03/13/18 0345  NA 136 134* 135  K 4.0 3.4* 3.8  CL 103 100 102  CO2 23  24 24  GLUCOSE 101* 181* 184*  BUN 13 12 18   CREATININE 0.87 0.88 0.77  CALCIUM 10.1 9.6 9.0   GFR: Estimated Creatinine Clearance: 72.7 mL/min (by C-G formula based on SCr of 0.77 mg/dL). Liver Function Tests: Recent Labs  Lab 03/11/18 1751  AST 17  ALT 17  ALKPHOS 73  BILITOT 0.4  PROT 7.7  ALBUMIN 3.8   No results for input(s): LIPASE, AMYLASE in the last 168 hours. No results for input(s):  AMMONIA in the last 168 hours. Coagulation Profile: No results for input(s): INR, PROTIME in the last 168 hours. Cardiac Enzymes: No results for input(s): CKTOTAL, CKMB, CKMBINDEX, TROPONINI in the last 168 hours. BNP (last 3 results) No results for input(s): PROBNP in the last 8760 hours. HbA1C: No results for input(s): HGBA1C in the last 72 hours. CBG: Recent Labs  Lab 03/12/18 2212 03/13/18 0947 03/13/18 1245 03/13/18 1422  GLUCAP 253* 179* 157* 142*   Lipid Profile: No results for input(s): CHOL, HDL, LDLCALC, TRIG, CHOLHDL, LDLDIRECT in the last 72 hours. Thyroid Function Tests: No results for input(s): TSH, T4TOTAL, FREET4, T3FREE, THYROIDAB in the last 72 hours. Anemia Panel: No results for input(s): VITAMINB12, FOLATE, FERRITIN, TIBC, IRON, RETICCTPCT in the last 72 hours. Sepsis Labs: Recent Labs  Lab 03/11/18 1753 03/11/18 2132  LATICACIDVEN 1.42 1.15    Recent Results (from the past 240 hour(s))  WOUND CULTURE     Status: None   Collection Time: 03/05/18  3:15 PM  Result Value Ref Range Status   MICRO NUMBER: [id number]  Final   SPECIMEN QUALITY: ADEQUATE  Final   SOURCE: SIDE OF LEFT FOOT  Final   STATUS: FINAL  Final   GRAM STAIN:   Final    No white blood cells seen No epithelial cells seen No organisms seen   ISOLATE 1: Staphylococcus haemolyticus  Final    Comment: Moderate growth of Staphylococcus haemolyticus      Susceptibility   Staphylococcus haemolyticus - AEROBIC CULT, GRAM STAIN POSITIVE 1    VANCOMYCIN <=0.5 Sensitive     CIPROFLOXACIN >=8 Resistant     CLINDAMYCIN >=8 Resistant     LEVOFLOXACIN >=8 Resistant     ERYTHROMYCIN >=8 Resistant     GENTAMICIN <=0.5 Sensitive     OXACILLIN* NR Resistant      * Oxacillin-resistant staphylococci are resistant toall currently available beta-lactam antimicrobialagents including penicillins, beta lactam/beta-lactamase inhibitor combinations, and cephems withstaphylococcal indications, including  Cefazolin.    TETRACYCLINE >=16 Resistant     TRIMETH/SULFA* <=10 Sensitive      * Oxacillin-resistant staphylococci are resistant toall currently available beta-lactam antimicrobialagents including penicillins, beta lactam/beta-lactamase inhibitor combinations, and cephems withstaphylococcal indications, including Cefazolin.Legend:S = Susceptible  I = IntermediateR = Resistant  NS = Not susceptible* = Not tested  NR = Not reported**NN = See antimicrobic comments  Blood culture (routine x 2)     Status: None (Preliminary result)   Collection Time: 03/11/18 10:30 PM  Result Value Ref Range Status   Specimen Description BLOOD LEFT ANTECUBITAL  Final   Special Requests   Final    BOTTLES DRAWN AEROBIC AND ANAEROBIC Blood Culture adequate volume   Culture   Final    NO GROWTH 2 DAYS Performed at [facility], [place] [address], [place] [address]    Report Status PENDING  Incomplete  Blood culture (routine x 2)     Status: None (Preliminary result)   Collection Time: 03/11/18 10:35 PM  Result  Value Ref Range Status   Specimen Description BLOOD LEFT HAND  Final   Special Requests   Final    BOTTLES DRAWN AEROBIC AND ANAEROBIC Blood Culture adequate volume   Culture   Final    NO GROWTH 2 DAYS Performed at [facility], [address], [place], [place] [address]    Report Status PENDING  Incomplete  Surgical pcr screen     Status: None   Collection Time: 03/12/18 10:24 PM  Result Value Ref Range Status   MRSA, PCR NEGATIVE NEGATIVE Final   Staphylococcus aureus NEGATIVE NEGATIVE Final    Comment: (NOTE) The Xpert SA Assay (FDA approved for NASAL specimens in patients 24 years of age and older), is one component of a comprehensive surveillance program. It is not intended to diagnose infection nor to guide or monitor treatment. Performed at [facility], [place] [address] [place], [place] [address]          Radiology Studies: Mr Foot Left Wo  Contrast  Result Date: 03/12/2018 CLINICAL DATA:  Swelling of the foot. Diabetes. Possible osteomyelitis. Fever. Nonhealing ulcer. EXAM: MRI OF THE LEFT HINDFOOT WITHOUT CONTRAST TECHNIQUE: Multiplanar, multisequence MR imaging of the ankle was performed. No intravenous contrast was administered. COMPARISON:  Multiple exams, including 11/21/2017 and radiographs from 03/11/2018 FINDINGS: TENDONS Peroneal: Similar to prior there is high-grade partial tearing of the peroneus brevis and considerable tendinopathy and expansion of the peroneus longus tendons. Not appreciably changed from 11/21/2017. Posteromedial: Unremarkable Anterior: Unremarkable Achilles: Mild distal tendinopathy similar to prior. Plantar Fascia: Thickened medial band of the plantar fascia without significant surrounding edema. LIGAMENTS Lateral: Attenuated anterior talofibular ligament, potentially chronically torn, not significantly changed from prior. Medial: Thickened tibionavicular and tibiospring portions of the deltoid ligament with surrounding edema or synovitis. Very poor definition of the superomedial portion of the spring ligament which is probably torn. The medioplantar oblique and inferoplantar longitudinal portions of the spring ligament are indistinct. CARTILAGE Ankle Joint: Mild degenerative chondral thinning without focal lesion. Subtalar Joints/Sinus Tarsi: Prominent degeneration of the middle subtalar facet. Bones: There is chronic prominent varus angulation at the Chopart joint. Abnormal edema signal is present throughout the cuboid, anterior calcaneus, and in the bases of the third fourth and fifth metatarsals extending into the metatarsal shafts probably due to underlying arthropathy given the similarity to the 11/21/2017 exam. There is an increasing amount of edema signal in the talar neck compared to previous, and also within the dorsal-proximal navicular as shown on images 17-24 of series 4, probably from stress reaction and  underlying Charcot arthropathy. Plantar to the articulation between the fourth and fifth metatarsals and the cuboid, there is abnormal localized subcutaneous edema and inflammatory phlegmon. Other: Subcutaneous edema along the dorsum of the lateral foot. IMPRESSION: 1. Abnormal localized subcutaneous inflammation below the lateral Lisfranc joint, with notable edema in the adjacent cuboid and metatarsal bases. This has a roughly similar appearance to 11/21/2017 exam and accordingly there is a strong possibility that this is from Charcot arthropathy rather than infection. The involvement of multiple bones also makes Charcot arthropathy more likely. If the patient has drainage from the plantar wound than that might favor chronic osteomyelitis. The MRI findings reflect edema which could be present in either condition. 2. Compared to the June exam, there has been some mild increase in edema in the talar neck and in the proximal dorsal navicular, probably from stress reaction. 3. There is extensive varus angulation at the Chopart joint. 4. Similar  finding of high-grade partial tearing of the peroneus brevis tendon without total rupture, and prominent tendinopathy of the peroneus longus tendon. 5. Thickening of the medial band of the plantar fascia. 6. Mild distal Achilles tendinopathy. 7. Probable torn superomedial portion of the spring ligament. Thickened adjacent tibionavicular and tibiospring portions of the deltoid ligament with adjacent edema. Electronically Signed   By: Van Clines M.D.   On: 03/12/2018 21:28   Dg Foot 2 Views Left  Result Date: 03/11/2018 CLINICAL DATA:  Wound along the lateral left foot. EXAM: LEFT FOOT - 2 VIEW COMPARISON:  None. FINDINGS: No fracture or dislocation. There is cortical resorption along the lateral margin of the proximal fifth metatarsal. This is consistent with osteomyelitis. No other areas of bone resorption to suggest additional osteomyelitis. Bones are diffusely  demineralized. There are prominent dorsal and plantar calcaneal spurs. Soft tissue swelling is seen most focally adjacent to the proximal fifth metatarsal. IMPRESSION: Osteomyelitis of the lateral base of the left fifth metatarsal. Electronically Signed   By: Lajean Manes M.D.   On: 03/11/2018 20:33        Scheduled Meds: . aspirin EC  325 mg Oral Daily  . DULoxetine  60 mg Oral QHS  . enoxaparin (LOVENOX) injection  40 mg Subcutaneous QHS  . gabapentin  800 mg Oral TID  . hydrochlorothiazide  12.5 mg Oral Daily  . insulin glargine  20 Units Subcutaneous QHS  . multivitamin with minerals  1 tablet Oral Daily  . nutrition supplement (JUVEN)  1 packet Oral BID BM   Continuous Infusions: . ceFEPime (MAXIPIME) IV 2 g (03/13/18 1249)  . lactated ringers    . metronidazole 500 mg (03/13/18 0547)  . vancomycin 1,000 mg (03/12/18 2338)     LOS: 2 days    Time spent: [place], MD [facility]  If 7PM-7AM, please contact night-coverage [email or web address] Password [email or web address] 03/13/2018, 4:04 PM

## 2018-03-13 NOTE — Op Note (Signed)
Patient Name: Olivia Romero DOB: 1955/08/20  MRN: [id number]  Date of Service: 03/13/18   Surgeon: Dr. Hardie Pulley, DPM Assistants: None Pre-operative Diagnosis: Diabetic foot infection left foot, osteomyelitis, Charcot neuroarthropathy Post-operative Diagnosis: Same Procedures:             1) debridement and irrigation of left foot wound  2) trocar bone biopsy left fifth metatarsal Pathology/Specimens: ID Type Source Tests Collected by Time Destination  1 : bone biopsy left foot Tissue Soft Tissue, Other SURGICAL PATHOLOGY Evelina Bucy, DPM 03/13/2018 1333   A : bone biopsy left foot Tissue Soft Tissue, Other GRAM STAIN, AEROBIC/ANAEROBIC CULTURE (SURGICAL/DEEP WOUND) Evelina Bucy, DPM 03/13/2018 1411    Anesthesia: Mac/Local Hemostasis: Anatomic Estimated Blood Loss: 2 ml Materials: None Medications: None Complications: None  Indications for Procedure:  This is a 62 year old female with concern of a wound infection to the left foot.  She was admitted to the hospital due to progressive fever and erythema of the wound.  At bedside the wound was erythematous with a small amount of purulence.  MRI was performed and showed concern for osteomyelitis versus Charcot.  It was discussed the patient should benefit from debridement of the wound and biopsy to better delineate the underlying process.  All risk benefits alternatives were explained to patient no guarantees were given.  Procedure in Detail: Patient was identified in pre-operative holding area. Formal consent was signed and the left lower extremity was marked. Patient was brought back to the operating room and remained on her stretcher in the supine position. Anesthesia was induced. The left lower extremity was prepped and draped in the usual sterile fashion. Timeout was taken to confirm patient name, laterality, and procedure prior to incision. Attention was then directed to the left foot where an ulceration measuring 2x2  was noted. The wound had a fibrogranular wound base, with periwound erythema and significant hyperkeratotic tissue.  The wound and hyperkeratotic tissue were thoroughly sharply excisionally debrided with 15 blade. The ulcer remained superficial without any areas of deep communication with the bone.  Post debridement the wound measured 2.5 x 2.5 superficial.  The wound was then thoroughly irrigated and debrided with a pulse lavage.  For the bone biopsy portion of the procedure, the contaminated gloves were removed.  Attention was directed to intact skin on the dorsal surface of the fifth metatarsal area. The fifth metatarsal bone was palpated. An incision was made overlying this area and a Jamshidi needle was advanced through subcu tissue to the bone.  The needle was advanced.  The bone was felt to be firm and did give resistance to the needle.  The needle was removed and a bone plug was removed from the needle.  This was split in half with a clean rongeur and was sent for both pathology and microbiology.  The foot was then dressed with 4x4, kerlix, and ACE bandage. Patient tolerated the procedure well.  Disposition: Following a period of post-operative monitoring, patient will be transferred back to the floor.

## 2018-03-13 NOTE — Plan of Care (Signed)
  Problem: Education: Goal: Knowledge of General Education information will improve Description Including pain rating scale, medication(s)/side effects and non-pharmacologic comfort measures Outcome: Progressing Note:  POC reviewed with pt.; for surgery this am and informed of being NPO; informed of MRI.

## 2018-03-14 ENCOUNTER — Encounter (HOSPITAL_COMMUNITY): Payer: Self-pay | Admitting: Podiatry

## 2018-03-14 LAB — GLUCOSE, CAPILLARY
Glucose-Capillary: 167 mg/dL — ABNORMAL HIGH (ref 70–99)
Glucose-Capillary: 228 mg/dL — ABNORMAL HIGH (ref 70–99)
Glucose-Capillary: 195 mg/dL — ABNORMAL HIGH (ref 70–99)

## 2018-03-14 NOTE — Care Management Important Message (Signed)
Important Message  Patient Details  Name: MAURENE HOLLIN MRN: [id number] Date of Birth: 09-30-1955   Medicare Important Message Given:  Yes    Taliah Porche [address] 03/14/2018, 4:40 PM

## 2018-03-14 NOTE — Progress Notes (Signed)
Subjective:  Patient ID: Olivia Romero, female    DOB: 06/27/1955,  MRN: [id number]  Patient seen this AM. Doing well. Denies pain. No overnight issues.  Objective:   Vitals:   03/14/18 0527 03/14/18 1116  BP: 130/83 (!) 110/49  Pulse: 65 71  Resp: 18 20  Temp: 97.9 F (36.6 C) 98.6 F (37 C)  SpO2: 100% 97%   Results for orders placed or performed during the hospital encounter of 03/11/18 (from the past 24 hour(s))  Glucose, capillary     Status: Abnormal   Collection Time: 03/13/18  8:46 PM  Result Value Ref Range   Glucose-Capillary 193 (H) 70 - 99 mg/dL  Glucose, capillary     Status: Abnormal   Collection Time: 03/14/18  8:10 AM  Result Value Ref Range   Glucose-Capillary 167 (H) 70 - 99 mg/dL    Results for orders placed or performed during the hospital encounter of 03/11/18  Blood culture (routine x 2)     Status: None (Preliminary result)   Collection Time: 03/11/18 10:30 PM  Result Value Ref Range Status   Specimen Description BLOOD LEFT ANTECUBITAL  Final   Special Requests   Final    BOTTLES DRAWN AEROBIC AND ANAEROBIC Blood Culture adequate volume   Culture   Final    NO GROWTH 3 DAYS Performed at [facility], [place] [address], [place], [place] [address]    Report Status PENDING  Incomplete  Blood culture (routine x 2)     Status: None (Preliminary result)   Collection Time: 03/11/18 10:35 PM  Result Value Ref Range Status   Specimen Description BLOOD LEFT HAND  Final   Special Requests   Final    BOTTLES DRAWN AEROBIC AND ANAEROBIC Blood Culture adequate volume   Culture   Final    NO GROWTH 3 DAYS Performed at [facility], [place] [address] [place], [place] [address]    Report Status PENDING  Incomplete  Surgical pcr screen     Status: None   Collection Time: 03/12/18 10:24 PM  Result Value Ref Range Status   MRSA, PCR NEGATIVE NEGATIVE Final   Staphylococcus aureus NEGATIVE NEGATIVE Final    Comment: (NOTE) The Xpert SA Assay (FDA  approved for NASAL specimens in patients 64 years of age and older), is one component of a comprehensive surveillance program. It is not intended to diagnose infection nor to guide or monitor treatment. Performed at [facility], [place] [address] [place], [place] [address]   Aerobic/Anaerobic Culture (surgical/deep wound)     Status: None (Preliminary result)   Collection Time: 03/13/18  2:11 PM  Result Value Ref Range Status   Specimen Description BONE LEFT FOOT  Final   Special Requests PATIENT ON FOLLOWING CEFOTAN AND VANC  Final   Gram Stain   Final    NO WBC SEEN FEW GRAM NEGATIVE RODS RARE GRAM VARIABLE ROD    Culture   Final    NO GROWTH < 24 HOURS Performed at [facility], [place] [address] [place], [place] [address]    Report Status PENDING  Incomplete    Constitutional Well developed. Well nourished.  Vascular Dorsalis pedis pulses palpable 1+ bilaterally. Posterior tibial pulses palpable 1+ bilaterally. Capillary refill normal to all digits.  No cyanosis or clubbing noted. Pedal hair growth absent.  Neurologic Normal speech. Oriented to person, place, and time. Protective sensation absent. Epicritic light touch severely diminished.  Dermatologic Dressing removed.  Needle biopsy site healing well Wound with resolved erythema, granular base. No probe to bone. No crepitus or fluctuance.  Orthopedic: No pain to palpation either foot.   Assessment & Plan:  Patient was evaluated and treated and all questions answered.  Diabetic Foot Ulcer with Infection -Clinically patient is improving, vitals stable, patient feeling better, wound improved. -At this point working dx is OM vs Charcot, or a combination of both. -Path reviewed. No osteomyelitis. -Micro reviewed. Bone biopsy culture NGTD but GNRs/GVRs visible on Gram Stain. As bacterial cells were found on GS I imagine this is a true result and not contaminant. Await culture growth and speciation though as  patient has been on Abx it is possible the cultures may not grow. -Path and Micro appear to be discordant. In this setting I think the micro results are likely to be more reliable. In an effort to avoid contamination the biopsy was not taken through the wound and so it is possible that the area of bone sampled may not have changes c/w OM on histology, however any presence of bacteria on micro would likely confirm OM as this was a sterile culture not taken through the wound.  -Recommend ID consult tomorrow upon more growth of cultures for recommendations on abx choice and duration of therapy. -On discharge patient would benefit from referral to wound care center.  Podiatry will continue to follow.

## 2018-03-14 NOTE — Progress Notes (Signed)
PROGRESS NOTE    Olivia Romero  PRF:[id number] DOB: September 20, 1955 DOA: 03/11/2018 PCP: Arnetha Courser, MD   Brief Narrative:  62 year old with past medical history relevant for depression/anxiety, type 2 diabetes on insulin, hypertension, diabetic neuropathy, COPD who comes in with fevers and chills due to concern for infected diabetic foot ulcer and possible osteomyelitis status post irrigation and debridement and bone biopsy on 03/13/2018.   Assessment & Plan:   Principal Problem:   Osteomyelitis ([place]) Active Problems:   Essential hypertension, benign   Type 2 diabetes mellitus with diabetic nephropathy (HCC)   Hypertriglyceridemia   OSA (obstructive sleep apnea)   Diabetic polyneuropathy associated with type 2 diabetes mellitus (HCC)   Diabetic foot ulcer (HCC)   BMI 36.0-36.9,adult   #)diabetic foot ulcer/possible osteo-: Patient is status post debridement and irrigation as well as bone biopsy on 03/13/2018. -MRI on admission concerning for osteo-versus Charcot arthropathy - Dr. March Rummage, podiatry, following appreciate recommendations -Continue IV cefepime, metronidazole, vancomycin started 03/11/2018 -Podiatry will discuss with infectious disease - Blood cultures from 03/11/2018 no growth to date  #) Type 2 diabetes: -Sliding scale insulin, SHS - Continue insulin glargine 20 units nightly - Carb restricted diet  #) Hypertension: - Continue HCTZ 12.5 mg daily -Continue aspirin 325   #) Pain/psych: -Continue duloxetine 60 mg nightly -Continue gabapentin 800 mg 3 times daily  Fluids: Tolerating p.o. Elect lites: Monitor and supplement Nutrition: Carb restricted diet  Prophylaxis: Enoxaparin  Disposition: Pending antibiotic plan and surgery  Full code      Consultants:   Podiatry  Procedures: )  03/13/2018: Debridement and bone biopsy in the OR  Antimicrobials:  IV cefepime, metronidazole, vancomycin   Subjective: Patient reports she is doing fairly  well after the surgery.  She denies any fevers, chills, nausea, vomiting, diarrhea, cough, congestion.  Objective: Vitals:   03/13/18 2050 03/14/18 0221 03/14/18 0527 03/14/18 1116  BP: (!) 113/50 118/60 130/83 (!) 110/49  Pulse: 71 68 65 71  Resp: 18 18 18 20   Temp: 98.8 F (37.1 C) 98.3 F (36.8 C) 97.9 F (36.6 C) 98.6 F (37 C)  TempSrc: Oral Oral Oral Oral  SpO2: 100% 100% 100% 97%  Weight:      Height:        Intake/Output Summary (Last 24 hours) at 03/14/2018 1348 Last data filed at 03/14/2018 0900 Gross per 24 hour  Intake 3143.47 ml  Output 301 ml  Net 2842.47 ml   Filed Weights   03/11/18 1726  Weight: 85.7 kg    Examination:  General exam: Appears calm and comfortable  Respiratory system: Clear to auscultation. Respiratory effort normal. Cardiovascular system: Regular rate and rhythm, no murmurs Gastrointestinal system: Abdomen is nondistended, soft and nontender. No organomegaly or masses felt. Normal bowel sounds heard. Central nervous system: Grossly intact, moving all extremities Extremities: Trace lower extremity edema r. Skin: Surgical site over left foot is clean dry and intact but dressed Psychiatry: Judgement and insight appear normal. Mood & affect appropriate.     Data Reviewed:  CBC: Recent Labs  Lab 03/11/18 1751 03/12/18 0034 03/13/18 0345  WBC 6.9 8.1 6.7  NEUTROABS 3.9  --   --   HGB 12.8 13.0 11.2*  HCT 42.3 42.8 37.5  MCV 79.2 78.5* 79.8*  PLT 343 323 935   Basic Metabolic Panel: Recent Labs  Lab 03/11/18 1751 03/12/18 0034 03/13/18 0345  NA 136 134* 135  K 4.0 3.4* 3.8  CL 103 100 102  CO2 23  24 24  GLUCOSE 101* 181* 184*  BUN 13 12 18   CREATININE 0.87 0.88 0.77  CALCIUM 10.1 9.6 9.0   GFR: Estimated Creatinine Clearance: 72.7 mL/min (by C-G formula based on SCr of 0.77 mg/dL). Liver Function Tests: Recent Labs  Lab 03/11/18 1751  AST 17  ALT 17  ALKPHOS 73  BILITOT 0.4  PROT 7.7  ALBUMIN 3.8   No  results for input(s): LIPASE, AMYLASE in the last 168 hours. No results for input(s): AMMONIA in the last 168 hours. Coagulation Profile: No results for input(s): INR, PROTIME in the last 168 hours. Cardiac Enzymes: No results for input(s): CKTOTAL, CKMB, CKMBINDEX, TROPONINI in the last 168 hours. BNP (last 3 results) No results for input(s): PROBNP in the last 8760 hours. HbA1C: No results for input(s): HGBA1C in the last 72 hours. CBG: Recent Labs  Lab 03/13/18 1245 03/13/18 1422 03/13/18 1719 03/13/18 2046 03/14/18 0810  GLUCAP 157* 142* 121* 193* 167*   Lipid Profile: No results for input(s): CHOL, HDL, LDLCALC, TRIG, CHOLHDL, LDLDIRECT in the last 72 hours. Thyroid Function Tests: No results for input(s): TSH, T4TOTAL, FREET4, T3FREE, THYROIDAB in the last 72 hours. Anemia Panel: No results for input(s): VITAMINB12, FOLATE, FERRITIN, TIBC, IRON, RETICCTPCT in the last 72 hours. Sepsis Labs: Recent Labs  Lab 03/11/18 1753 03/11/18 2132  LATICACIDVEN 1.42 1.15    Recent Results (from the past 240 hour(s))  WOUND CULTURE     Status: None   Collection Time: 03/05/18  3:15 PM  Result Value Ref Range Status   MICRO NUMBER: [id number]  Final   SPECIMEN QUALITY: ADEQUATE  Final   SOURCE: SIDE OF LEFT FOOT  Final   STATUS: FINAL  Final   GRAM STAIN:   Final    No white blood cells seen No epithelial cells seen No organisms seen   ISOLATE 1: Staphylococcus haemolyticus  Final    Comment: Moderate growth of Staphylococcus haemolyticus      Susceptibility   Staphylococcus haemolyticus - AEROBIC CULT, GRAM STAIN POSITIVE 1    VANCOMYCIN <=0.5 Sensitive     CIPROFLOXACIN >=8 Resistant     CLINDAMYCIN >=8 Resistant     LEVOFLOXACIN >=8 Resistant     ERYTHROMYCIN >=8 Resistant     GENTAMICIN <=0.5 Sensitive     OXACILLIN* NR Resistant      * Oxacillin-resistant staphylococci are resistant toall currently available beta-lactam antimicrobialagents including penicillins, beta  lactam/beta-lactamase inhibitor combinations, and cephems withstaphylococcal indications, including Cefazolin.    TETRACYCLINE >=16 Resistant     TRIMETH/SULFA* <=10 Sensitive      * Oxacillin-resistant staphylococci are resistant toall currently available beta-lactam antimicrobialagents including penicillins, beta lactam/beta-lactamase inhibitor combinations, and cephems withstaphylococcal indications, including Cefazolin.Legend:S = Susceptible  I = IntermediateR = Resistant  NS = Not susceptible* = Not tested  NR = Not reported**NN = See antimicrobic comments  Blood culture (routine x 2)     Status: None (Preliminary result)   Collection Time: 03/11/18 10:30 PM  Result Value Ref Range Status   Specimen Description BLOOD LEFT ANTECUBITAL  Final   Special Requests   Final    BOTTLES DRAWN AEROBIC AND ANAEROBIC Blood Culture adequate volume   Culture   Final    NO GROWTH 3 DAYS Performed at [facility], [place] [address] [place], [place] [address]    Report Status PENDING  Incomplete  Blood culture (routine x 2)     Status: None (Preliminary result)   Collection Time: 03/11/18 10:35  PM  Result Value Ref Range Status   Specimen Description BLOOD LEFT HAND  Final   Special Requests   Final    BOTTLES DRAWN AEROBIC AND ANAEROBIC Blood Culture adequate volume   Culture   Final    NO GROWTH 3 DAYS Performed at [facility], [address], [place], [place] [address]    Report Status PENDING  Incomplete  Surgical pcr screen     Status: None   Collection Time: 03/12/18 10:24 PM  Result Value Ref Range Status   MRSA, PCR NEGATIVE NEGATIVE Final   Staphylococcus aureus NEGATIVE NEGATIVE Final    Comment: (NOTE) The Xpert SA Assay (FDA approved for NASAL specimens in patients 77 years of age and older), is one component of a comprehensive surveillance program. It is not intended to diagnose infection nor to guide or monitor treatment. Performed at [facility], [place]  [address], [place], [place] [address]   Aerobic/Anaerobic Culture (surgical/deep wound)     Status: None (Preliminary result)   Collection Time: 03/13/18  2:11 PM  Result Value Ref Range Status   Specimen Description BONE LEFT FOOT  Final   Special Requests PATIENT ON FOLLOWING CEFOTAN AND VANC  Final   Gram Stain   Final    NO WBC SEEN FEW GRAM NEGATIVE RODS RARE GRAM VARIABLE ROD    Culture   Final    NO GROWTH < 24 HOURS Performed at [facility], [place] [address], [place], [place] [address]    Report Status PENDING  Incomplete         Radiology Studies: Mr Foot Left Wo Contrast  Result Date: 03/12/2018 CLINICAL DATA:  Swelling of the foot. Diabetes. Possible osteomyelitis. Fever. Nonhealing ulcer. EXAM: MRI OF THE LEFT HINDFOOT WITHOUT CONTRAST TECHNIQUE: Multiplanar, multisequence MR imaging of the ankle was performed. No intravenous contrast was administered. COMPARISON:  Multiple exams, including 11/21/2017 and radiographs from 03/11/2018 FINDINGS: TENDONS Peroneal: Similar to prior there is high-grade partial tearing of the peroneus brevis and considerable tendinopathy and expansion of the peroneus longus tendons. Not appreciably changed from 11/21/2017. Posteromedial: Unremarkable Anterior: Unremarkable Achilles: Mild distal tendinopathy similar to prior. Plantar Fascia: Thickened medial band of the plantar fascia without significant surrounding edema. LIGAMENTS Lateral: Attenuated anterior talofibular ligament, potentially chronically torn, not significantly changed from prior. Medial: Thickened tibionavicular and tibiospring portions of the deltoid ligament with surrounding edema or synovitis. Very poor definition of the superomedial portion of the spring ligament which is probably torn. The medioplantar oblique and inferoplantar longitudinal portions of the spring ligament are indistinct. CARTILAGE Ankle Joint: Mild degenerative chondral thinning without focal lesion. Subtalar  Joints/Sinus Tarsi: Prominent degeneration of the middle subtalar facet. Bones: There is chronic prominent varus angulation at the Chopart joint. Abnormal edema signal is present throughout the cuboid, anterior calcaneus, and in the bases of the third fourth and fifth metatarsals extending into the metatarsal shafts probably due to underlying arthropathy given the similarity to the 11/21/2017 exam. There is an increasing amount of edema signal in the talar neck compared to previous, and also within the dorsal-proximal navicular as shown on images 17-24 of series 4, probably from stress reaction and underlying Charcot arthropathy. Plantar to the articulation between the fourth and fifth metatarsals and the cuboid, there is abnormal localized subcutaneous edema and inflammatory phlegmon. Other: Subcutaneous edema along the dorsum of the lateral foot. IMPRESSION: 1. Abnormal localized subcutaneous inflammation below the lateral Lisfranc joint, with notable edema in the  adjacent cuboid and metatarsal bases. This has a roughly similar appearance to 11/21/2017 exam and accordingly there is a strong possibility that this is from Charcot arthropathy rather than infection. The involvement of multiple bones also makes Charcot arthropathy more likely. If the patient has drainage from the plantar wound than that might favor chronic osteomyelitis. The MRI findings reflect edema which could be present in either condition. 2. Compared to the June exam, there has been some mild increase in edema in the talar neck and in the proximal dorsal navicular, probably from stress reaction. 3. There is extensive varus angulation at the Chopart joint. 4. Similar finding of high-grade partial tearing of the peroneus brevis tendon without total rupture, and prominent tendinopathy of the peroneus longus tendon. 5. Thickening of the medial band of the plantar fascia. 6. Mild distal Achilles tendinopathy. 7. Probable torn superomedial portion of  the spring ligament. Thickened adjacent tibionavicular and tibiospring portions of the deltoid ligament with adjacent edema. Electronically Signed   By: Van Clines M.D.   On: 03/12/2018 21:28   Dg Foot 2 Views Left  Result Date: 03/13/2018 CLINICAL DATA:  Postop EXAM: LEFT FOOT - 2 VIEW COMPARISON:  03/11/2018 FINDINGS: Stable sclerosis and periosteal reaction at the lateral Lisfranc joint and fifth metatarsal base status post debridement and bone biopsy. No complicating fracture. No dislocation. Pes cavus appearance, although not weight-bearing IMPRESSION: No acute finding after debridement. Electronically Signed   By: Monte Fantasia M.D.   On: 03/13/2018 16:32        Scheduled Meds: . aspirin EC  325 mg Oral Daily  . DULoxetine  60 mg Oral QHS  . enoxaparin (LOVENOX) injection  40 mg Subcutaneous QHS  . gabapentin  800 mg Oral TID  . hydrochlorothiazide  12.5 mg Oral Daily  . insulin glargine  20 Units Subcutaneous QHS  . multivitamin with minerals  1 tablet Oral Daily  . nutrition supplement (JUVEN)  1 packet Oral BID BM   Continuous Infusions: . ceFEPime (MAXIPIME) IV 2 g (03/14/18 1100)  . lactated ringers 10 mL/hr at 03/14/18 1104  . metronidazole 500 mg (03/14/18 1337)  . vancomycin Stopped (03/14/18 0345)     LOS: 3 days    Time spent: [place], MD [facility]  If 7PM-7AM, please contact night-coverage [email or web address] Password [email or web address] 03/14/2018, 1:48 PM

## 2018-03-14 NOTE — Progress Notes (Signed)
Pharmacy Antibiotic Note  Olivia Romero is a 62 y.o. female admitted on 03/11/2018 with cellulitis/diabetic foot infection.  Pharmacy has been consulted for vancomycin and cefepime dosing. Metronidazole per MD. WBC wnl. Afebrile. SCr stable. S/p I&D of foot on 10/9  Plan: -Continue vancomycin 1g q12h -Cefepime 2 g x1, then cefepime 2g q12h -Metronidazole 500 mg q8h per MD -Will consider VT tomorrow   Height: 5' (152.4 cm) Weight: 189 lb (85.7 kg) IBW/kg (Calculated) : 45.5  Temp (24hrs), Avg:98.1 F (36.7 C), Min:97.6 F (36.4 C), Max:98.8 F (37.1 C)  Recent Labs  Lab 03/11/18 1751 03/11/18 1753 03/11/18 2132 03/12/18 0034 03/13/18 0345  WBC 6.9  --   --  8.1 6.7  CREATININE 0.87  --   --  0.88 0.77  LATICACIDVEN  --  1.42 1.15  --   --     Estimated Creatinine Clearance: 72.7 mL/min (by C-G formula based on SCr of 0.77 mg/dL).    Allergies  Allergen Reactions  . Eggs Or Egg-Derived Products Swelling and Other (See Comments)    Angioedema  . Atorvastatin Other (See Comments)    Liver toxicity  . Flu Virus Vaccine Swelling    Arm swelled (site of injection)  . Latex Itching  . Pravastatin Itching and Rash  . Tape Rash and Other (See Comments)    TAPE PULLS OFF THE SKIN!!! Please use an alternative!!    Antimicrobials this admission: Vancomycin 10/7 >>  Cefepime 10/7 >>  Metronidazole 10/7 >>  Dose adjustments this admission: N/A  Microbiology results: 10/7 BCx2>> ngtd  10/1 Foot wound >> MRSA  10/9 Foot wound >> pending   Thank you for allowing pharmacy to be a part of this patient's care.  Albertina Parr, PharmD., BCPS Clinical Pharmacist Clinical phone for 03/14/18 until 3:30pm: [phone] If after 3:30pm, please refer to [facility] for unit-specific pharmacist

## 2018-03-14 NOTE — Progress Notes (Signed)
Nutrition Follow-up  DOCUMENTATION CODES:   Obesity unspecified  INTERVENTION:   -Continue 1 packet Juven BID, each packet provides 80 calories, 8 grams of carbohydrate, and 14 grams of amino acids; supplement contains CaHMB, glutamine, and arginine, to promote wound healing -Continue MVI with minerals  NUTRITION DIAGNOSIS:   Increased nutrient needs related to wound healing as evidenced by estimated needs.  Ongoing  GOAL:   Patient will meet greater than or equal to 90% of their needs  Progressing  MONITOR:   PO intake, Supplement acceptance, Labs, Weight trends, Skin, I & O's  REASON FOR ASSESSMENT:   Malnutrition Screening Tool    ASSESSMENT:   Olivia Romero is a 62 y.o. female with medical history significant of HTN, HLD, neuropathy, diabetes mellitus type 2; who presents with complaints of fever.  Patient had been being followed Dr. Jacqualyn Posey of podiatry for nonhealing ulcer of her left foot for the last month.  Patient had been noted to have some cellulitis around the wound.  Treated with antibiotics of ciprofloxacin, clindamycin, partial doxycycline course, and had been switched to patient had been switched to Bactrim over the last week.  Over the last few days patient reported chills and fever up to 100 F with achy joint pain, sweats, headache, nausea and generalized malaise.  Notes some brownish discharge when changing her bandages.  Cultures had been obtained of the wound 1 week ago and was noted to have grown Staphylococcus hemolyticus.  She reports following all dressing changes as advised.  She followed up with her podiatrist today regarding symptoms and was advised to be seen by her primary care provider.  In her PCPs office she was found to have a temperature up to 102 F, but was found to be negative for influenza.  Patient denies having any shortness of breath, cough, dysuria, abdominal pain, or diarrhea.  10/9- s/p Procedure(s): IRRIGATION AND DEBRIDEMENT  FOOT WITH BONE BIOPSY WITH MISONIX DEBRIDER (Left)  Case discussed with RN prior to visit, who reports that pt is progressing well and just took her Juven supplement.   Spoke with pt at bedside, who reports good appetite. She reports that she has underwent a lot of lifestyle modifications over the past 3 months and experienced intentional weight loss because of this. Pt shares that she has been trying to decrease portion sizes and has eliminated potato chips and bananas in her diet due to high potassium levels. Pt shares that she recently had a tooth extracted and had been eating softer texture foods, however, this issue has since resolved. Noted meal completion 100%.   Spoke with pt regarding DM control at home. Pt shares that she started walking daily at a rec center near her home in [place] (she recently stopped this due to DM foot infection but plans to continue once she receives medical clearance to resume exercise). Pt is compliant with her DM medications and usually has CBGS less than 180. Discussed with pt importance of DM self-management to assist with healing. Praised pt for efforts made.   Pt reports taking her Juven and MVI. Encouraged pt to continue MVI at home. Pt with no further questions regarding DM but expressed appreciation for visit.   Labs reviewed: CBGS: 121-193 (inpatient orders for glycemic control are 0-20 units insulin glargine daily).   NUTRITION - FOCUSED PHYSICAL EXAM:    Most Recent Value  Orbital Region  No depletion  Upper Arm Region  No depletion  Thoracic and Lumbar Region  No depletion  Buccal Region  No depletion  Temple Region  No depletion  Clavicle Bone Region  No depletion  Clavicle and Acromion Bone Region  No depletion  Scapular Bone Region  No depletion  Dorsal Hand  No depletion  Patellar Region  No depletion  Anterior Thigh Region  No depletion  Posterior Calf Region  Mild depletion  Edema (RD Assessment)  None  Hair  Reviewed  Eyes   Reviewed  Mouth  Reviewed  Skin  Reviewed  Nails  Reviewed       Diet Order:   Diet Order            Diet Carb Modified Fluid consistency: Thin; Room service appropriate? Yes  Diet effective now              EDUCATION NEEDS:   No education needs have been identified at this time  Skin:  Skin Assessment: Skin Integrity Issues: Skin Integrity Issues:: Diabetic Ulcer Diabetic Ulcer: lt lateral mid foot with osteomyelitis  Last BM:  03/13/18  Height:   Ht Readings from Last 1 Encounters:  03/11/18 5' (1.524 m)    Weight:   Wt Readings from Last 1 Encounters:  03/11/18 85.7 kg    Ideal Body Weight:  45.5 kg  BMI:  Body mass index is 36.91 kg/m.  Estimated Nutritional Needs:   Kcal:  1700-1900  Protein:  90-105 grams  Fluid:  >1.7 L    Shahrukh Pasch A. Jimmye Norman, RD, LDN, CDE Pager: [phone] After hours Pager: [phone]

## 2018-03-14 NOTE — Progress Notes (Signed)
RT offered to place pt on CPAP for the night. Pt stated she will do it when she is ready to go to sleep. RT told pt to call if she changed her mind about needing any help placing the CPAP on. RT will continue to monitor.

## 2018-03-15 ENCOUNTER — Inpatient Hospital Stay: Payer: Self-pay

## 2018-03-15 DIAGNOSIS — L97522 Non-pressure chronic ulcer of other part of left foot with fat layer exposed: Secondary | ICD-10-CM

## 2018-03-15 DIAGNOSIS — M19072 Primary osteoarthritis, left ankle and foot: Secondary | ICD-10-CM

## 2018-03-15 DIAGNOSIS — Z887 Allergy status to serum and vaccine status: Secondary | ICD-10-CM

## 2018-03-15 DIAGNOSIS — M7752 Other enthesopathy of left foot: Secondary | ICD-10-CM

## 2018-03-15 DIAGNOSIS — Z91040 Latex allergy status: Secondary | ICD-10-CM

## 2018-03-15 DIAGNOSIS — Z872 Personal history of diseases of the skin and subcutaneous tissue: Secondary | ICD-10-CM

## 2018-03-15 DIAGNOSIS — Z888 Allergy status to other drugs, medicaments and biological substances status: Secondary | ICD-10-CM

## 2018-03-15 DIAGNOSIS — Z91012 Allergy to eggs: Secondary | ICD-10-CM

## 2018-03-15 DIAGNOSIS — Z91048 Other nonmedicinal substance allergy status: Secondary | ICD-10-CM

## 2018-03-15 DIAGNOSIS — I10 Essential (primary) hypertension: Secondary | ICD-10-CM

## 2018-03-15 DIAGNOSIS — E785 Hyperlipidemia, unspecified: Secondary | ICD-10-CM

## 2018-03-15 DIAGNOSIS — E1169 Type 2 diabetes mellitus with other specified complication: Secondary | ICD-10-CM

## 2018-03-15 DIAGNOSIS — M86672 Other chronic osteomyelitis, left ankle and foot: Secondary | ICD-10-CM

## 2018-03-15 DIAGNOSIS — X58XXXD Exposure to other specified factors, subsequent encounter: Secondary | ICD-10-CM

## 2018-03-15 DIAGNOSIS — S86822D Laceration of other muscle(s) and tendon(s) at lower leg level, left leg, subsequent encounter: Secondary | ICD-10-CM

## 2018-03-15 DIAGNOSIS — Z792 Long term (current) use of antibiotics: Secondary | ICD-10-CM

## 2018-03-15 DIAGNOSIS — M25472 Effusion, left ankle: Secondary | ICD-10-CM

## 2018-03-15 LAB — GLUCOSE, CAPILLARY
GLUCOSE-CAPILLARY: 178 mg/dL — AB (ref 70–99)
GLUCOSE-CAPILLARY: 179 mg/dL — AB (ref 70–99)
Glucose-Capillary: 173 mg/dL — ABNORMAL HIGH (ref 70–99)
Glucose-Capillary: 153 mg/dL — ABNORMAL HIGH (ref 70–99)

## 2018-03-15 MED ORDER — SODIUM CHLORIDE 0.9 % IV SOLN
2.0000 g | INTRAVENOUS | Status: DC
  Administered 2018-03-15 – 2018-03-16 (×2): 2 g via INTRAVENOUS
  Filled 2018-03-15 (×3): qty 20

## 2018-03-15 MED ORDER — INSULIN ASPART 100 UNIT/ML ~~LOC~~ SOLN
0.0000 [IU] | Freq: Every day | SUBCUTANEOUS | Status: DC
  Administered 2018-03-16: 3 [IU] via SUBCUTANEOUS

## 2018-03-15 MED ORDER — INSULIN ASPART 100 UNIT/ML ~~LOC~~ SOLN
0.0000 [IU] | Freq: Three times a day (TID) | SUBCUTANEOUS | Status: DC
  Administered 2018-03-15 – 2018-03-16 (×2): 2 [IU] via SUBCUTANEOUS
  Administered 2018-03-16: 3 [IU] via SUBCUTANEOUS
  Administered 2018-03-16: 2 [IU] via SUBCUTANEOUS
  Administered 2018-03-17: 1 [IU] via SUBCUTANEOUS

## 2018-03-15 MED ORDER — METRONIDAZOLE 500 MG PO TABS
500.0000 mg | ORAL_TABLET | Freq: Three times a day (TID) | ORAL | Status: DC
  Administered 2018-03-15 – 2018-03-17 (×5): 500 mg via ORAL
  Filled 2018-03-15 (×5): qty 1

## 2018-03-15 NOTE — Progress Notes (Signed)
Inpatient Diabetes Program Recommendations  AACE/ADA: New Consensus Statement on Inpatient Glycemic Control (2015)  Target Ranges:  Prepandial:   less than 140 mg/dL      Peak postprandial:   less than 180 mg/dL (1-2 hours)      Critically ill patients:  140 - 180 mg/dL   Results for EVALETTE, MONTROSE (MRN [id number]) as of 03/15/2018 12:21  Ref. Range 03/14/2018 08:10 03/14/2018 17:38 03/14/2018 21:47  Glucose-Capillary Latest Ref Range: 70 - 99 mg/dL 167 (H) 228 (H) 195 (H)   Results for MARYLON, VERNO (MRN [id number]) as of 03/15/2018 12:21  Ref. Range 03/15/2018 08:49  Glucose-Capillary Latest Ref Range: 70 - 99 mg/dL 178 (H)   Results for SABRINNA, YEARWOOD (MRN [id number]) as of 03/15/2018 12:21  Ref. Range 03/05/2018 15:40  Hemoglobin A1C Latest Ref Range: <5.7 % of total Hgb 6.9 (H)    Admit with: Osteomyelitis, diabetic foot ulcer  History: DM  Home DM Meds: Amaryl 2 mg BID       Tresiba (insulin degludec) 20 units QHS            Invokamet 150/1000 mg--2 tablet QHS  Current Orders: Lantus 20 units QHS       MD- Please add Novolog Moderate Correction Scale/ SSI (0-15 units) TID AC + HS     --Will follow patient during hospitalization--  Wyn Quaker RN, MSN, CDE Diabetes Coordinator Inpatient Glycemic Control Team Team Pager: [phone] (8a-5p)

## 2018-03-15 NOTE — Plan of Care (Signed)

## 2018-03-15 NOTE — Progress Notes (Signed)
[facility] Infusion Coordinator will follow with ID team to support [facility]  Infusion Pharmacy services for  Home IV ABX at DC as ordered.   If patient discharges after hours, please call [phone].   Larry Sierras 03/15/2018, 9:09 PM

## 2018-03-15 NOTE — Progress Notes (Signed)
PROGRESS NOTE    Olivia Romero  IRC:[id number] DOB: Aug 23, 1955 DOA: 03/11/2018 PCP: Arnetha Courser, MD   Brief Narrative:  62 year old with past medical history relevant for depression/anxiety, type 2 diabetes on insulin, hypertension, diabetic neuropathy, COPD who comes in with fevers and chills due to concern for infected diabetic foot ulcer and possible osteomyelitis status post irrigation and debridement and bone biopsy on 03/13/2018.   Assessment & Plan:   Principal Problem:   Osteomyelitis ([place]) Active Problems:   Essential hypertension, benign   Type 2 diabetes mellitus with diabetic nephropathy (HCC)   Hypertriglyceridemia   OSA (obstructive sleep apnea)   Diabetic polyneuropathy associated with type 2 diabetes mellitus ([place])   Diabetic foot ulcer (HCC)   BMI 36.0-36.9,adult   #)diabetic foot ulcer/possible osteo-: Patient is status post debridement and irrigation as well as bone biopsy on 03/13/2018. -MRI on admission concerning for osteo-versus Charcot arthropathy - Dr. March Rummage, podiatry, following appreciate recommendations -Continue IV cefepime, metronidazole, vancomycin started 03/11/2018 -ID consulted for antibiotic course - Blood cultures from 03/11/2018 no growth to date -Cultures from wound site on 03/13/2018 with no growth to date -Smear from 03/13/2017 shows GNR's and GPC's  #) Type 2 diabetes: -Sliding scale insulin, SHS - Continue insulin glargine 20 units nightly - Carb restricted diet  #) Hypertension: - Continue HCTZ 12.5 mg daily -Continue aspirin 325   #) Pain/psych: -Continue duloxetine 60 mg nightly -Continue gabapentin 800 mg 3 times daily  Fluids: Tolerating p.o. Elect lites: Monitor and supplement Nutrition: Carb restricted diet  Prophylaxis: Enoxaparin  Disposition: Pending antibiotic plan and surgery  Full code      Consultants:   Podiatry  Procedures: )  03/13/2018: Debridement and bone biopsy in the  OR  Antimicrobials:  IV cefepime, metronidazole, vancomycin started 03/11/2018   Subjective: Patient reports she is doing fairly well.  She does not have any complaints.  She denies any nausea, vomiting, chills, fevers, cough, congestion, rhinorrhea.  Objective: Vitals:   03/14/18 2148 03/14/18 2228 03/15/18 0642 03/15/18 1310  BP: (!) 129/48  137/63 (!) 120/54  Pulse: 68  62 62  Resp: 18  16 17   Temp: 99.2 F (37.3 C)  97.7 F (36.5 C) 98.2 F (36.8 C)  TempSrc: Oral  Oral Oral  SpO2: 100% 100% 98% 99%  Weight:      Height:        Intake/Output Summary (Last 24 hours) at 03/15/2018 1414 Last data filed at 03/15/2018 0900 Gross per 24 hour  Intake 3265.16 ml  Output 1250 ml  Net 2015.16 ml   Filed Weights   03/11/18 1726  Weight: 85.7 kg    Examination:  General exam: Appears calm and comfortable  Respiratory system: Clear to auscultation. Respiratory effort normal. Cardiovascular system: Regular rate and rhythm, no murmurs Gastrointestinal system: Abdomen is nondistended, soft and nontender. No organomegaly or masses felt. Normal bowel sounds heard. Central nervous system: Grossly intact, moving all extremities Extremities: Trace lower extremity edema r. Skin: Surgical site over left foot is clean dry and intact but dressed Psychiatry: Judgement and insight appear normal. Mood & affect appropriate.     Data Reviewed:  CBC: Recent Labs  Lab 03/11/18 1751 03/12/18 0034 03/13/18 0345  WBC 6.9 8.1 6.7  NEUTROABS 3.9  --   --   HGB 12.8 13.0 11.2*  HCT 42.3 42.8 37.5  MCV 79.2 78.5* 79.8*  PLT 343 323 510   Basic Metabolic Panel: Recent Labs  Lab 03/11/18 1751 03/12/18 0034  03/13/18 0345  NA 136 134* 135  K 4.0 3.4* 3.8  CL 103 100 102  CO2 23 24 24   GLUCOSE 101* 181* 184*  BUN 13 12 18   CREATININE 0.87 0.88 0.77  CALCIUM 10.1 9.6 9.0   GFR: Estimated Creatinine Clearance: 72.7 mL/min (by C-G formula based on SCr of 0.77 mg/dL). Liver  Function Tests: Recent Labs  Lab 03/11/18 1751  AST 17  ALT 17  ALKPHOS 73  BILITOT 0.4  PROT 7.7  ALBUMIN 3.8   No results for input(s): LIPASE, AMYLASE in the last 168 hours. No results for input(s): AMMONIA in the last 168 hours. Coagulation Profile: No results for input(s): INR, PROTIME in the last 168 hours. Cardiac Enzymes: No results for input(s): CKTOTAL, CKMB, CKMBINDEX, TROPONINI in the last 168 hours. BNP (last 3 results) No results for input(s): PROBNP in the last 8760 hours. HbA1C: No results for input(s): HGBA1C in the last 72 hours. CBG: Recent Labs  Lab 03/14/18 0810 03/14/18 1738 03/14/18 2147 03/15/18 0849 03/15/18 1255  GLUCAP 167* 228* 195* 178* 173*   Lipid Profile: No results for input(s): CHOL, HDL, LDLCALC, TRIG, CHOLHDL, LDLDIRECT in the last 72 hours. Thyroid Function Tests: No results for input(s): TSH, T4TOTAL, FREET4, T3FREE, THYROIDAB in the last 72 hours. Anemia Panel: No results for input(s): VITAMINB12, FOLATE, FERRITIN, TIBC, IRON, RETICCTPCT in the last 72 hours. Sepsis Labs: Recent Labs  Lab 03/11/18 1753 03/11/18 2132  LATICACIDVEN 1.42 1.15    Recent Results (from the past 240 hour(s))  WOUND CULTURE     Status: None   Collection Time: 03/05/18  3:15 PM  Result Value Ref Range Status   MICRO NUMBER: [id number]  Final   SPECIMEN QUALITY: ADEQUATE  Final   SOURCE: SIDE OF LEFT FOOT  Final   STATUS: FINAL  Final   GRAM STAIN:   Final    No white blood cells seen No epithelial cells seen No organisms seen   ISOLATE 1: Staphylococcus haemolyticus  Final    Comment: Moderate growth of Staphylococcus haemolyticus      Susceptibility   Staphylococcus haemolyticus - AEROBIC CULT, GRAM STAIN POSITIVE 1    VANCOMYCIN <=0.5 Sensitive     CIPROFLOXACIN >=8 Resistant     CLINDAMYCIN >=8 Resistant     LEVOFLOXACIN >=8 Resistant     ERYTHROMYCIN >=8 Resistant     GENTAMICIN <=0.5 Sensitive     OXACILLIN* NR Resistant      *  Oxacillin-resistant staphylococci are resistant toall currently available beta-lactam antimicrobialagents including penicillins, beta lactam/beta-lactamase inhibitor combinations, and cephems withstaphylococcal indications, including Cefazolin.    TETRACYCLINE >=16 Resistant     TRIMETH/SULFA* <=10 Sensitive      * Oxacillin-resistant staphylococci are resistant toall currently available beta-lactam antimicrobialagents including penicillins, beta lactam/beta-lactamase inhibitor combinations, and cephems withstaphylococcal indications, including Cefazolin.Legend:S = Susceptible  I = IntermediateR = Resistant  NS = Not susceptible* = Not tested  NR = Not reported**NN = See antimicrobic comments  Blood culture (routine x 2)     Status: None (Preliminary result)   Collection Time: 03/11/18 10:30 PM  Result Value Ref Range Status   Specimen Description BLOOD LEFT ANTECUBITAL  Final   Special Requests   Final    BOTTLES DRAWN AEROBIC AND ANAEROBIC Blood Culture adequate volume   Culture   Final    NO GROWTH 4 DAYS Performed at [facility], [place] [address], [place], [place] [address]    Report Status PENDING  Incomplete  Blood culture (routine x 2)     Status: None (Preliminary result)   Collection Time: 03/11/18 10:35 PM  Result Value Ref Range Status   Specimen Description BLOOD LEFT HAND  Final   Special Requests   Final    BOTTLES DRAWN AEROBIC AND ANAEROBIC Blood Culture adequate volume   Culture   Final    NO GROWTH 4 DAYS Performed at [facility], [place] [address] [place], [place] [address]    Report Status PENDING  Incomplete  Surgical pcr screen     Status: None   Collection Time: 03/12/18 10:24 PM  Result Value Ref Range Status   MRSA, PCR NEGATIVE NEGATIVE Final   Staphylococcus aureus NEGATIVE NEGATIVE Final    Comment: (NOTE) The Xpert SA Assay (FDA approved for NASAL specimens in patients 12 years of age and older), is one component of a comprehensive surveillance  program. It is not intended to diagnose infection nor to guide or monitor treatment. Performed at [facility], [place] [address], [place], [place] [address]   Aerobic/Anaerobic Culture (surgical/deep wound)     Status: None (Preliminary result)   Collection Time: 03/13/18  2:11 PM  Result Value Ref Range Status   Specimen Description BONE LEFT FOOT  Final   Special Requests PATIENT ON FOLLOWING CEFOTAN AND VANC  Final   Gram Stain   Final    NO WBC SEEN FEW GRAM NEGATIVE RODS RARE GRAM VARIABLE ROD    Culture   Final    NO GROWTH 2 DAYS NO ANAEROBES ISOLATED; CULTURE IN PROGRESS FOR 5 DAYS Performed at [facility], [place] [address], [place], [place] [address]    Report Status PENDING  Incomplete         Radiology Studies: Dg Foot 2 Views Left  Result Date: 03/13/2018 CLINICAL DATA:  Postop EXAM: LEFT FOOT - 2 VIEW COMPARISON:  03/11/2018 FINDINGS: Stable sclerosis and periosteal reaction at the lateral Lisfranc joint and fifth metatarsal base status post debridement and bone biopsy. No complicating fracture. No dislocation. Pes cavus appearance, although not weight-bearing IMPRESSION: No acute finding after debridement. Electronically Signed   By: Monte Fantasia M.D.   On: 03/13/2018 16:32        Scheduled Meds: . aspirin EC  325 mg Oral Daily  . DULoxetine  60 mg Oral QHS  . enoxaparin (LOVENOX) injection  40 mg Subcutaneous QHS  . gabapentin  800 mg Oral TID  . hydrochlorothiazide  12.5 mg Oral Daily  . insulin glargine  20 Units Subcutaneous QHS  . metroNIDAZOLE  500 mg Oral Q8H  . multivitamin with minerals  1 tablet Oral Daily  . nutrition supplement (JUVEN)  1 packet Oral BID BM   Continuous Infusions: . ceFEPime (MAXIPIME) IV 2 g (03/15/18 1009)  . lactated ringers 10 mL/hr at 03/15/18 0634  . vancomycin 1,000 mg (03/15/18 1349)     LOS: 4 days    Time spent: [place], MD [facility]  If 7PM-7AM, please contact  night-coverage [email or web address] Password [email or web address] 03/15/2018, 2:14 PM

## 2018-03-15 NOTE — Progress Notes (Signed)
RT checked on pt with her CPAP. Pt places herself on CPAP dream station when she is ready for bed. RT checked H2O and pt is set for the night. RT informed pt to call if she needs any assistance at all. RT will continue to monitor.

## 2018-03-15 NOTE — Progress Notes (Signed)
PHARMACY CONSULT NOTE FOR:  OUTPATIENT  PARENTERAL ANTIBIOTIC THERAPY (OPAT)  Indication: Osteomyelitis  Regimen: Vancomycin 1 gm IV Q 12 hours + Ceftriaxone 2 gm every 24 hours End date:  04/13/18  IV antibiotic discharge orders are pended. To discharging provider:  please sign these orders via discharge navigator,  Select New Orders & click on the button choice - Manage This Unsigned Work.     Thank you for allowing pharmacy to be a part of this patient's care.  Susa Raring, PharmD, BCPS Infectious Diseases Clinical Pharmacist Phone: [phone] 03/15/2018, 4:00 PM

## 2018-03-15 NOTE — Consult Note (Signed)
[facility]    Date of Admission:  03/11/2018   Total days of antibiotics 4        Day 4 cefepime         Day 4 vancomycin         Day 4 metronidazole       Reason for Consult: Chronic neuropathic ulcer      Referring Provider: Levada Dy  Primary Care Provider: Arnetha Courser, MD   Assessment: Olivia Romero is a 62 y.o. 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 She is insensate and anatomic changes reflecting Charcot arthropathy with severe tendinosis and partial tendon tear at the fibula causing her foot to turn inward redistributing the pressure and contributing to poor healing. Foot appears well perfused (warm and ulceration with bleeding) as well as easily palpated pulse. She has had numerous rounds of oral antibiotics lately for periwound cellulitis and drainage. Ulceration was debrided in the OR only as there is no exposed bone. Bone biopsy does not suggest any acute osteomyelitis and culture is negative (following several weeks of intermittent oral therapy and 24h of IV broad spectrum coverage). Gram stain revealed few GNRs and rare GVRs. I think it is likely she has infection playing a role considering drainage, erythema and fevers otherwise unexplained in addition to the chronic changes noted to anatomy noted on MRI.   We discussed possibility of course of IV antibiotics at home in addition to good wound care, blood sugar control and offloading of her foot for a period of time. She is open to this. 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 Would treat with vancomycin + cefepime + PO metronidazole. Could consider vancomycin + ertapenem if insurance allows for easier administration at home.   Plan:  1. Consider 4 weeks for IV therapy with vancomycin + cefepime + metronidazole  2. Change IV metronidazole to PO  Will  discuss with Dr. Johnnye Sima further    Principal Problem:   Osteomyelitis [facility]) Active Problems:   Essential hypertension, benign   Type 2 diabetes mellitus with diabetic nephropathy (HCC)   Hypertriglyceridemia   OSA (obstructive sleep apnea)   Diabetic polyneuropathy associated with type 2 diabetes mellitus ([place])   Diabetic foot ulcer (HCC)   BMI 36.0-36.9,adult   . aspirin EC  325 mg Oral Daily  . DULoxetine  60 mg Oral QHS  . enoxaparin (LOVENOX) injection  40 mg Subcutaneous QHS  . gabapentin  800 mg Oral TID  . hydrochlorothiazide  12.5 mg Oral Daily  . insulin glargine  20 Units Subcutaneous QHS  . multivitamin with minerals  1 tablet Oral Daily  . nutrition supplement (JUVEN)  1 packet Oral BID BM    HPI: Olivia Romero is a 62 y.o. female with past medical history of HTN, HLD, neuropathy, T2DM.   She presented to the hospital on 10/07 with concern for fevers. In addition to fevers up to 102 F she has had some arthralgias, sweats, headaches and nausea. In the ER she was found to be febrile up to 102.4 F and tachycardic. She remained normotensive with normal WBC. She was started empirically on vancomycin, metronidazole and cefepime in the ER. She has since underwent irrigation and debridement with bone biopsy on - Op note reviewed: 2x2 cm ulceration present, fibro granular wound base with periwound  erythema and hyperkeratotic tissue that was excised sharply; no deep communication to bone noted. Bone cultures were obtained through a surgical incision through subcutaneous tissue to avoid surface contamination.   She has a chronic nonhealing neuropathic ulcer on the left foot of which she has been working with Dr. Jacqualyn Posey outpatient to heal since 2017. She tells me this has been a "whirlwind" lately and everything has happened in a matter of 3-4 weeks. She has had several intermittent episodes of chills and temperatures ~99.7 F. Since September she has been treated with  ciprofloxacin + clindamycin (9/19-9/30), doxycycline (10/01 - 10/04) and as of this week Bactrim (10/07 - admit) for cellulitis and intermittent drainage around the ulcer. She tells me the best improvement was after the first course of antibiotics in her opinion although she is looking at her foot using a hand mirror at home as she cannot feel anything d/t diabetic neuropathy. She has tried several boots and air casts to off load and was advised to stay off her foot as much as possible while this heals however she has "not done a good job listening to that." She has been up and around at church quite often and was planning to go to a retreat soon. She walks frequently in her home.   Vascular ABI in 2018 - normal arterial flow. She does not smoke cigarettes.   MRI L ankle 11/2017 - severe tendinosis and high-grade partial-thickness tear at the distal fibula; moderate tendinosis of the peroneus longus, severe osteoarthritis of the calcaneocuboid joint with moderate effusion and marrow edema. Read as concern for stress reaction.   MR L Foot 03/12/18 - abnormal subcutaneous inflammation with notable edema in the adjacent cuboid and metatarsal bases. Strong possibility it is due to charcot arthropathy rather than infection. Other consideration is chronic ostomyelitis.   XRay 10/07 - resorption changes c/w osteomyelitis   Review of Systems: Review of Systems  All other systems reviewed and are negative.   Past Medical History:  Diagnosis Date  . Anemia   . Arthritis   . Asthma   . Carpal tunnel syndrome on right   . Colon polyps    adenomatous  . Diabetes mellitus without complication ([place])   . Diverticulosis of colon   . Esophagitis   . GERD (gastroesophageal reflux disease)   . Hemorrhoid    internal  . Hyperlipemia   . Hypertension   . Neuropathy   . Pneumonia   . PONV (postoperative nausea and vomiting)   . Sleep apnea    has had in the past lost 50 pounds and do longer uses cpap  .  Stroke-like episode ([place]) 2009   TIA    Social History   Tobacco Use  . Smoking status: Never Smoker  . Smokeless tobacco: Never Used  Substance Use Topics  . Alcohol use: Never    Frequency: Never  . Drug use: No    Family History  Problem Relation Age of Onset  . Lung cancer Father   . Hypertension Father   . Arthritis Father   . Other Mother        hardening of the arteries/renal cell carcenoma  . Hypertension Mother   . Stroke Mother   . Kidney cancer Mother   . Arthritis Brother   . Rheum arthritis Maternal Uncle   . Bladder Cancer Neg Hx    Allergies  Allergen Reactions  . Eggs Or Egg-Derived Products Swelling and Other (See Comments)    Angioedema  .  Atorvastatin Other (See Comments)    Liver toxicity  . Flu Virus Vaccine Swelling    Arm swelled (site of injection)  . Latex Itching  . Pravastatin Itching and Rash  . Tape Rash and Other (See Comments)    TAPE PULLS OFF THE SKIN!!! Please use an alternative!!    OBJECTIVE: Blood pressure 137/63, pulse 62, temperature 97.7 F (36.5 C), temperature source Oral, resp. rate 16, height 5' (1.524 m), weight 85.7 kg, SpO2 98 %.  Physical Exam  Constitutional: She is oriented to person, place, and time. She appears well-developed and well-nourished.  Seated comfortably in recliner. Appears well, non-toxic.   HENT:  Mouth/Throat: Mucous membranes are normal. No oral lesions. Normal dentition. No dental abscesses. No oropharyngeal exudate.  Eyes: Pupils are equal, round, and reactive to light. No scleral icterus.  Cardiovascular: Normal rate, regular rhythm, normal heart sounds and intact distal pulses.  No murmur heard. Pulmonary/Chest: Effort normal and breath sounds normal. No respiratory distress.  Abdominal: Soft. Bowel sounds are normal. She exhibits no distension. There is no tenderness.  Musculoskeletal:  L foot noted to be turned inward d/t chronic arthropathic changes of bone/tendons. Larger lateral  ulceration with  subcutaneous fat exposed. Smaller ulceration noted to the plantar surface as well. No peri-wound erythema. Swelling is present. No tenderness or sensation at all. Bleeding noted with removing bandage. No odor.   Lymphadenopathy:    She has no cervical adenopathy.  Neurological: She is alert and oriented to person, place, and time.  Skin: Skin is warm and dry. No rash noted.  Psychiatric: She has a normal mood and affect. Judgment normal.  In good spirits today and engaged in care discussion      Lab Results Lab Results  Component Value Date   WBC 6.7 03/13/2018   HGB 11.2 (L) 03/13/2018   HCT 37.5 03/13/2018   MCV 79.8 (L) 03/13/2018   PLT 315 03/13/2018    Lab Results  Component Value Date   CREATININE 0.77 03/13/2018   BUN 18 03/13/2018   NA 135 03/13/2018   K 3.8 03/13/2018   CL 102 03/13/2018   CO2 24 03/13/2018    Lab Results  Component Value Date   ALT 17 03/11/2018   AST 17 03/11/2018   ALKPHOS 73 03/11/2018   BILITOT 0.4 03/11/2018     Microbiology: 10/09 Bone cx >> few GNR, rare GNR on stain, no growth (on broad antibiotics) Pathology of bone >> no acute osteomyelitis   Janene Madeira, MSN, NP-C [facility] [place] Cell: [phone] Pager: [phone]  03/15/2018 11:29 AM

## 2018-03-15 NOTE — Progress Notes (Signed)
Subjective:  Patient ID: Olivia Romero, female    DOB: May 05, 1956,  MRN: [id number]  Patient seen at bedside. Having some chills at night but no fevers. Seen by ID today.  Objective:   Vitals:   03/15/18 0642 03/15/18 1310  BP: 137/63 (!) 120/54  Pulse: 62 62  Resp: 16 17  Temp: 97.7 F (36.5 C) 98.2 F (36.8 C)  SpO2: 98% 99%   Results for orders placed or performed during the hospital encounter of 03/11/18 (from the past 24 hour(s))  Glucose, capillary     Status: Abnormal   Collection Time: 03/14/18  9:47 PM  Result Value Ref Range   Glucose-Capillary 195 (H) 70 - 99 mg/dL  Glucose, capillary     Status: Abnormal   Collection Time: 03/15/18  8:49 AM  Result Value Ref Range   Glucose-Capillary 178 (H) 70 - 99 mg/dL  Glucose, capillary     Status: Abnormal   Collection Time: 03/15/18 12:55 PM  Result Value Ref Range   Glucose-Capillary 173 (H) 70 - 99 mg/dL  Glucose, capillary     Status: Abnormal   Collection Time: 03/15/18  6:06 PM  Result Value Ref Range   Glucose-Capillary 153 (H) 70 - 99 mg/dL   Comment 1 Notify RN     Results for orders placed or performed during the hospital encounter of 03/11/18  Blood culture (routine x 2)     Status: None (Preliminary result)   Collection Time: 03/11/18 10:30 PM  Result Value Ref Range Status   Specimen Description BLOOD LEFT ANTECUBITAL  Final   Special Requests   Final    BOTTLES DRAWN AEROBIC AND ANAEROBIC Blood Culture adequate volume   Culture   Final    NO GROWTH 4 DAYS Performed at [facility], [place] [address] [place], [place] [address]    Report Status PENDING  Incomplete  Blood culture (routine x 2)     Status: None (Preliminary result)   Collection Time: 03/11/18 10:35 PM  Result Value Ref Range Status   Specimen Description BLOOD LEFT HAND  Final   Special Requests   Final    BOTTLES DRAWN AEROBIC AND ANAEROBIC Blood Culture adequate volume   Culture   Final    NO GROWTH 4 DAYS Performed at  [facility], [place] [address] [place], [place] [address]    Report Status PENDING  Incomplete  Surgical pcr screen     Status: None   Collection Time: 03/12/18 10:24 PM  Result Value Ref Range Status   MRSA, PCR NEGATIVE NEGATIVE Final   Staphylococcus aureus NEGATIVE NEGATIVE Final    Comment: (NOTE) The Xpert SA Assay (FDA approved for NASAL specimens in patients 52 years of age and older), is one component of a comprehensive surveillance program. It is not intended to diagnose infection nor to guide or monitor treatment. Performed at [facility], [place] [address], [place],  [address]   Aerobic/Anaerobic Culture (surgical/deep wound)     Status: None (Preliminary result)   Collection Time: 03/13/18  2:11 PM  Result Value Ref Range Status   Specimen Description BONE LEFT FOOT  Final   Special Requests PATIENT ON FOLLOWING CEFOTAN AND VANC  Final   Gram Stain   Final    NO WBC SEEN FEW GRAM NEGATIVE RODS RARE GRAM VARIABLE ROD    Culture   Final    NO GROWTH 2 DAYS NO ANAEROBES ISOLATED; CULTURE IN PROGRESS FOR 5 DAYS  Performed at [facility], [place] [address] [place], [place] [address]    Report Status PENDING  Incomplete    Constitutional Well developed. Well nourished.  Vascular Dorsalis pedis pulses palpable 1+ bilaterally. Posterior tibial pulses palpable 1+ bilaterally. Capillary refill normal to all digits.  No cyanosis or clubbing noted. Pedal hair growth absent.  Neurologic Normal speech. Oriented to person, place, and time. Protective sensation absent. Epicritic light touch severely diminished.  Dermatologic Dressing left intact. Picture in chart reviewed.  Orthopedic: No pain to palpation either foot.   Assessment & Plan:  Patient was evaluated and treated and all questions answered.  Diabetic Foot Ulcer with Infection -Clinically patient is improving, vitals stable, patient feeling better, wound improved. -WBAT in CAM Boot LLE.  Advised to limit activity to minimum necessary. -Micro still pending. NGTD. -Abx per ID. Per note today recommending vanco/cetriaxone/flagyl x 4 weeks. -On discharge patient would benefit from referral to wound care center. -Ok to discharge from podiatry standpoint. -Patient has f/u appt with Dr. Jacqualyn Posey 1:45 on Tuesday.  Podiatry will continue to follow. Please call with questions or concerns.

## 2018-03-16 LAB — BASIC METABOLIC PANEL WITH GFR
Anion gap: 9 (ref 5–15)
Chloride: 97 mmol/L — ABNORMAL LOW (ref 98–111)
GFR calc Af Amer: >60 mL/min (ref >60)
GFR calc non Af Amer: >60 mL/min (ref >60)
Potassium: 3.7 mmol/L (ref 3.5–5.1)

## 2018-03-16 LAB — BASIC METABOLIC PANEL
BUN: 20 mg/dL (ref 6–20)
CO2: 27 mmol/L (ref 22–32)
Calcium: 8.9 mg/dL (ref 8.9–10.3)
Creatinine, Ser: 0.71 mg/dL (ref 0.44–1.00)
Glucose, Bld: 324 mg/dL — ABNORMAL HIGH (ref 70–99)
Sodium: 133 mmol/L — ABNORMAL LOW (ref 135–145)

## 2018-03-16 LAB — CULTURE, BLOOD (ROUTINE X 2)
CULTURE: NO GROWTH
Culture: NO GROWTH
Special Requests: ADEQUATE
Special Requests: ADEQUATE

## 2018-03-16 LAB — GLUCOSE, CAPILLARY
GLUCOSE-CAPILLARY: 201 mg/dL — AB (ref 70–99)
GLUCOSE-CAPILLARY: 196 mg/dL — AB (ref 70–99)
GLUCOSE-CAPILLARY: 289 mg/dL — AB (ref 70–99)
Glucose-Capillary: 153 mg/dL — ABNORMAL HIGH (ref 70–99)

## 2018-03-16 MED ORDER — METRONIDAZOLE 500 MG PO TABS: 500.0000 mg | ORAL_TABLET | Freq: Three times a day (TID) | ORAL | 0 refills | Status: AC

## 2018-03-16 MED ORDER — CEFTRIAXONE IV (FOR PTA / DISCHARGE USE ONLY): 2.0000 g | INTRAVENOUS | 0 refills | Status: AC

## 2018-03-16 MED ORDER — SODIUM CHLORIDE 0.9% FLUSH
10.0000 mL | INTRAVENOUS | Status: DC | PRN
  Administered 2018-03-17: 10 mL
  Filled 2018-03-16: qty 40

## 2018-03-16 MED ORDER — VANCOMYCIN IV (FOR PTA / DISCHARGE USE ONLY): 1000.0000 mg | Freq: Two times a day (BID) | INTRAVENOUS | 0 refills | Status: AC

## 2018-03-16 NOTE — Discharge Instructions (Signed)
PICC Home Guide °A peripherally inserted central catheter (PICC) is a long, thin, flexible tube that is inserted into a vein in the upper arm. It is a form of intravenous (IV) access. It is considered to be a "central" line because the tip of the PICC ends in a large vein in your chest. This large vein is called the superior vena cava (SVC). The PICC tip ends in the SVC because there is a lot of blood flow in the SVC. This allows medicines and IV fluids to be quickly distributed throughout the body. The PICC is inserted using a sterile technique by a specially trained nurse or physician. After the PICC is inserted, a chest X-ray exam is done to be sure it is in the correct place. °A PICC may be placed for different reasons, such as: °· To give medicines and liquid nutrition that can only be given through a central line. Examples are: °? Certain antibiotic treatments. °? Chemotherapy. °? Total parenteral nutrition (TPN). °· To take frequent blood samples. °· To give IV fluids and blood products. °· If there is difficulty placing a peripheral intravenous (PIV) catheter. ° °If taken care of properly, a PICC can remain in place for several months. A PICC can also allow a person to go home from the hospital early. Medicine and PICC care can be managed at home by a family member or home health care team. °What problems can happen when I have a PICC? °Problems with a PICC can occasionally occur. These may include the following: °· A blood clot (thrombus) forming in or at the tip of the PICC. This can cause the PICC to become clogged. A clot-dissolving medicine called tissue plasminogen activator (tPA) can be given through the PICC to help break up the clot. °· Inflammation of the vein (phlebitis) in which the PICC is placed. Signs of inflammation may include redness, pain at the insertion site, red streaks, or being able to feel a "cord" in the vein where the PICC is located. °· Infection in the PICC or at the insertion  site. Signs of infection may include fever, chills, redness, swelling, or pus drainage from the PICC insertion site. °· PICC movement (malposition). The PICC tip may move from its original position due to excessive physical activity, forceful coughing, sneezing, or vomiting. °· A break or cut in the PICC. It is important to not use scissors near the PICC. °· Nerve or tendon irritation or injury during PICC insertion. ° °What should I keep in mind about activities when I have a PICC? °· You may bend your arm and move it freely. If your PICC is near or at the bend of your elbow, avoid activity with repeated motion at the elbow. °· Rest at home for the remainder of the day following PICC line insertion. °· Avoid lifting heavy objects as instructed by your health care provider. °· Avoid using a crutch with the arm on the same side as your PICC. You may need to use a walker. °What should I know about my PICC dressing? °· Keep your PICC bandage (dressing) clean and dry to prevent infection. °? Ask your health care provider when you may shower. Ask your health care provider to teach you how to wrap the PICC when you do take a shower. °· Change the PICC dressing as instructed by your health care provider. °· Change your PICC dressing if it becomes loose or wet. °What should I know about PICC care? °· Check the PICC insertion   site daily for leakage, redness, swelling, or pain. °· Do not take a bath, swim, or use hot tubs when you have a PICC. Cover PICC line with clear plastic wrap and tape to keep it dry while showering. °· Flush the PICC as directed by your health care provider. Let your health care provider know right away if the PICC is difficult to flush or does not flush. Do not use force to flush the PICC. °· Do not use a syringe that is less than 10 mL to flush the PICC. °· Never pull or tug on the PICC. °· Avoid blood pressure checks on the arm with the PICC. °· Keep your PICC identification card with you at all  times. °· Do not take the PICC out yourself. Only a trained clinical professional should remove the PICC. °Get help right away if: °· Your PICC is accidentally pulled all the way out. If this happens, cover the insertion site with a bandage or gauze dressing. Do not throw the PICC away. Your health care provider will need to inspect it. °· Your PICC was tugged or pulled and has partially come out. Do not  push the PICC back in. °· There is any type of drainage, redness, or swelling where the PICC enters the skin. °· You cannot flush the PICC, it is difficult to flush, or the PICC leaks around the insertion site when it is flushed. °· You hear a "flushing" sound when the PICC is flushed. °· You have pain, discomfort, or numbness in your arm, shoulder, or jaw on the same side as the PICC. °· You feel your heart "racing" or skipping beats. °· You notice a hole or tear in the PICC. °· You develop chills or a fever. °This information is not intended to replace advice given to you by your health care provider. Make sure you discuss any questions you have with your health care provider. °Document Released: 11/26/2002 Document Revised: 12/10/2015 Document Reviewed: 03/14/2013 °[employer] Interactive Patient Education © 2017 [employer] ° °

## 2018-03-16 NOTE — Discharge Summary (Addendum)
Physician Discharge Summary  Olivia Romero RKY:[id number] DOB: 1955-07-06 DOA: 03/11/2018  PCP: Arnetha Courser, MD  Admit date: 03/11/2018 Discharge date: 03/16/2018  Addendum: 03/17/2018 patient was pending discharge and was stable for discharge on 03/16/2018 but needed a dose of vancomycin at 2 AM so it was deemed appropriate to keep her overnight and discharge her this morning.  No clinical changes.  Admitted From: Home Disposition: Home  Recommendations for Outpatient Follow-up:  1. Follow up with PCP in 1-2 weeks 2. Please see below for outpatient labs while on IV antibiotics: OPAT Orders Discharge antibiotics: vanco/ceftriaxone/flagyl Per pharmacy protocol vanco Aim for Vancomycin trough 15-20 (unless otherwise indicated) Duration: 28 days End Date: 04-13-18  [facility] Care Per Protocol:  Labs weekly while on IV antibiotics: _x_ CBC with differential __ BMP _x_ CMP _x_ CRP __x ESR _x_ Vancomycin trough __ CK  __x Please pull PIC at completion of IV antibiotics __ Please leave PIC in place until doctor has seen patient or been notified  Fax weekly labs to [phone]  Clinic Follow Up Appt:1 month ID  3. Please follow up on the following pending results: Surgical cultures from 03/13/2018  Home Health: Yes, home health antibiotics Equipment/Devices: P ICC placed 03/16/2018  Discharge Condition: Stable CODE STATUS: Full Diet recommendation: Carb Modified   Brief/Interim Summary:  #) Diabetic foot ulcer/possible osteo-: Patient was admitted inpatient with fevers and chills and left plantar surface diabetic foot ulcer.  MRI done on admission was equivocal for osteo-versus Charcot joint.  Patient was taken to the operating room on 03/13/2018 with debridement of wound and culture and Gram stain was obtained.  There is no evidence of osteomyelitis on biopsy of the bone.  It did not probe to bone.  Gram stain showed evidence of GNR's and rare gram-positive rods.   Infectious disease was consulted and recommended 4 weeks of IV vancomycin, ceftriaxone, oral metronidazole.  #) Type 2 diabetes: Patient was maintained on long-acting insulin and sliding scale here.  #) Pretension: Patient was continued on HCTZ and full dose aspirin.  #) Pain/psych: Patient was continued on home duloxetine and gabapentin.  Discharge Diagnoses:  Principal Problem:   Osteomyelitis ([place]) Active Problems:   Essential hypertension, benign   Type 2 diabetes mellitus with diabetic nephropathy (HCC)   Hypertriglyceridemia   OSA (obstructive sleep apnea)   Diabetic polyneuropathy associated with type 2 diabetes mellitus (HCC)   Diabetic foot ulcer (HCC)   BMI 36.0-36.9,adult    Discharge Instructions  Discharge Instructions    Call MD for:  difficulty breathing, headache or visual disturbances   Complete by:  As directed    Call MD for:  extreme fatigue   Complete by:  As directed    Call MD for:  hives   Complete by:  As directed    Call MD for:  persistant dizziness or light-headedness   Complete by:  As directed    Call MD for:  persistant nausea and vomiting   Complete by:  As directed    Call MD for:  redness, tenderness, or signs of infection (pain, swelling, redness, odor or green/yellow discharge around incision site)   Complete by:  As directed    Call MD for:  severe uncontrolled pain   Complete by:  As directed    Call MD for:  temperature >100.4   Complete by:  As directed    Diet - low sodium heart healthy   Complete by:  As directed    Discharge  instructions   Complete by:  As directed    Please take your antibiotics as prescribed.  Please follow-up with your primary care doctor in 1 week.  Please follow-up with your podiatrist and infectious disease.   Home infusion instructions [facility] May follow [place] Dosing Protocol; May administer Cathflo as needed to maintain patency of vascular access device.; Flushing of vascular access  device: per [facility] Protocol: 0.9% NaCl pre/post medica...   Complete by:  As directed    Instructions:  May follow [place] Dosing Protocol   Instructions:  May administer Cathflo as needed to maintain patency of vascular access device.   Instructions:  Flushing of vascular access device: per [facility] Protocol: 0.9% NaCl pre/post medication administration and prn patency; Heparin 100 u/ml, 33m for implanted ports and Heparin 10u/ml, 588mfor all other central venous catheters.   Instructions:  May follow [facility] Anaphylaxis Protocol for First Dose Administration in the home: 0.9% NaCl at 25-50 ml/hr to maintain IV access for protocol meds. Epinephrine 0.3 ml IV/IM PRN and Benadryl 25-50 IV/IM PRN s/s of anaphylaxis.   Instructions:  AdOpalnfusion Coordinator (RN) to assist per patient IV care needs in the home PRN.   Increase activity slowly   Complete by:  As directed      Allergies as of 03/16/2018      Reactions   Eggs Or Egg-derived Products Swelling, Other (See Comments)   Angioedema   Atorvastatin Other (See Comments)   Liver toxicity   Flu Virus Vaccine Swelling   Arm swelled (site of injection)   Latex Itching   Pravastatin Itching, Rash   Tape Rash, Other (See Comments)   TAPE PULLS OFF THE SKIN!!! Please use an alternative!!      Medication List    STOP taking these medications   sulfamethoxazole-trimethoprim 800-160 MG tablet Commonly known as:  BACTRIM DS,SEPTRA DS     TAKE these medications   acetaminophen 500 MG tablet Commonly known as:  TYLENOL Take 1,000 mg by mouth every 6 (six) hours as needed for mild pain.   albuterol 108 (90 Base) MCG/ACT inhaler Commonly known as:  PROVENTIL HFA;VENTOLIN HFA Inhale 2 puffs into the lungs every 6 (six) hours as needed for wheezing or shortness of breath.   aspirin EC 325 MG tablet Take 1 tablet (325 mg total) by mouth daily.   cefTRIAXone  IVPB Commonly known as:  ROCEPHIN Inject 2 g into the vein daily.  Indication:  Osteomyelitis  Last Day of Therapy:  04/13/18 Labs - Once weekly:  CBC/D and BMP, Labs - Every other week:  ESR and CRP   CONTOUR NEXT MONITOR w/Device Kit as directed.   CONTOUR NEXT TEST test strip Generic drug:  glucose blood as directed.   DULoxetine 60 MG capsule Commonly known as:  CYMBALTA Take 60 mg by mouth at bedtime.   fenofibrate 145 MG tablet Commonly known as:  TRICOR Take 145 mg by mouth daily.   gabapentin 400 MG capsule Commonly known as:  NEURONTIN Take 800 mg by mouth 3 (three) times daily.   glimepiride 2 MG tablet Commonly known as:  AMARYL Take 2 mg by mouth 2 (two) times daily.   hydrochlorothiazide 12.5 MG tablet Commonly known as:  HYDRODIURIL Take 12.5 mg by mouth daily.   INVOKAMET XR [phone] MG Tb24 Generic drug:  Canagliflozin-metFORMIN HCl ER Take 2 tablets by mouth at bedtime.   metroNIDAZOLE 500 MG tablet Commonly known as:  FLAGYL Take 1  tablet (500 mg total) by mouth 3 (three) times daily.   multivitamin with minerals Tabs tablet Take 1 tablet by mouth daily. Reported on 07/21/2015   pantoprazole 40 MG tablet Commonly known as:  PROTONIX Take 1 tablet (40 mg total) by mouth daily.   torsemide 20 MG tablet Commonly known as:  DEMADEX Take 1 tablet (20 mg total) by mouth every other day.   TRESIBA FLEXTOUCH 100 UNIT/ML Sopn FlexTouch Pen Generic drug:  insulin degludec Inject 20 Units into the skin at bedtime.   TRESIBA FLEXTOUCH 200 UNIT/ML Sopn Generic drug:  Insulin Degludec Inject 20 Units into the skin at bedtime.   vancomycin  IVPB Inject 1,000 mg into the vein every 12 (twelve) hours. Indication:  Osteomyelitis Last Day of Therapy:  04/13/18 Labs - _0 /12/19 [place] for Infectious Disease Follow up on 04/16/2018.   Specialty:  Infectious Diseases Why:  2:30 PM appointment with Colletta Maryland, NP. Please call with any need to reschedule this visit.  Contact information: [place], [place] [id number] [place] [address] [phone]         Allergies  Allergen Reactions  . Eggs Or Egg-Derived Products Swelling and Other (See Comments)    Angioedema  . Atorvastatin Other (See Comments)    Liver toxicity  . Flu Virus Vaccine Swelling    Arm swelled (site of injection)  . Latex Itching  . Pravastatin Itching and Rash  . Tape Rash and Other (See Comments)    TAPE PULLS OFF THE SKIN!!! Please use an alternative!!    Consultations:  Podiatry, Dr. March Rummage  Infectious disease   Procedures/Studies: Dg Ankle Complete Left  Result Date:  02/22/2018 Please see detailed radiograph report in office note.  Mr Foot Left Wo Contrast  Result Date: 03/12/2018 CLINICAL DATA:  Swelling of the foot. Diabetes. Possible osteomyelitis. Fever. Nonhealing ulcer. EXAM: MRI OF THE LEFT HINDFOOT WITHOUT CONTRAST TECHNIQUE: Multiplanar, multisequence MR imaging of the ankle was performed. No intravenous contrast was administered. COMPARISON:  Multiple exams, including 11/21/2017 and radiographs from 03/11/2018 FINDINGS: TENDONS Peroneal:  Similar to prior there is high-grade partial tearing of the peroneus brevis and considerable tendinopathy and expansion of the peroneus longus tendons. Not appreciably changed from 11/21/2017. Posteromedial: Unremarkable Anterior: Unremarkable Achilles: Mild distal tendinopathy similar to prior. Plantar Fascia: Thickened medial band of the plantar fascia without significant surrounding edema. LIGAMENTS Lateral: Attenuated anterior talofibular ligament, potentially chronically torn, not significantly changed from prior. Medial: Thickened tibionavicular and tibiospring portions of the deltoid ligament with surrounding edema or synovitis. Very poor definition of the superomedial portion of the spring ligament which is probably torn. The medioplantar oblique and inferoplantar longitudinal portions of the spring ligament are indistinct. CARTILAGE Ankle Joint: Mild degenerative chondral thinning without focal lesion. Subtalar Joints/Sinus Tarsi: Prominent degeneration of the middle subtalar facet. Bones: There is chronic prominent varus angulation at the Chopart joint. Abnormal edema signal is present throughout the cuboid, anterior calcaneus, and in the bases of the third fourth and fifth metatarsals extending into the metatarsal shafts probably due to underlying arthropathy given the similarity to the 11/21/2017 exam. There is an increasing amount of edema signal in the talar neck compared to previous, and also within the  dorsal-proximal navicular as shown on images 17-24 of series 4, probably from stress reaction and underlying Charcot arthropathy. Plantar to the articulation between the fourth and fifth metatarsals and the cuboid, there is abnormal localized subcutaneous edema and inflammatory phlegmon. Other: Subcutaneous edema along the dorsum of the lateral foot. IMPRESSION: 1. Abnormal localized subcutaneous inflammation below the lateral Lisfranc joint, with notable edema in the adjacent cuboid and metatarsal bases. This has a roughly similar appearance to 11/21/2017 exam and accordingly there is a strong possibility that this is from Charcot arthropathy rather than infection. The involvement of multiple bones also makes Charcot arthropathy more likely. If the patient has drainage from the plantar wound than that might favor chronic osteomyelitis. The MRI findings reflect edema which could be present in either condition. 2. Compared to the June exam, there has been some mild increase in edema in the talar neck and in the proximal dorsal navicular, probably from stress reaction. 3. There is extensive varus angulation at the Chopart joint. 4. Similar finding of high-grade partial tearing of the peroneus brevis tendon without total rupture, and prominent tendinopathy of the peroneus longus tendon. 5. Thickening of the medial band of the plantar fascia. 6. Mild distal Achilles tendinopathy. 7. Probable torn superomedial portion of the spring ligament. Thickened adjacent tibionavicular and tibiospring portions of the deltoid ligament with adjacent edema. Electronically Signed   By: Van Clines M.D.   On: 03/12/2018 21:28   Dg Foot 2 Views Left  Result Date: 03/13/2018 CLINICAL DATA:  Postop EXAM: LEFT FOOT - 2 VIEW COMPARISON:  03/11/2018 FINDINGS: Stable sclerosis and periosteal reaction at the lateral Lisfranc joint and fifth metatarsal base status post debridement and bone biopsy. No complicating fracture. No  dislocation. Pes cavus appearance, although not weight-bearing IMPRESSION: No acute finding after debridement. Electronically Signed   By: Monte Fantasia M.D.   On: 03/13/2018 16:32   Dg Foot 2 Views Left  Result Date: 03/11/2018 CLINICAL DATA:  Wound along the lateral left foot. EXAM: LEFT FOOT - 2 VIEW COMPARISON:  None. FINDINGS: No fracture or dislocation. There is cortical resorption along the lateral margin of the proximal fifth metatarsal. This is consistent with osteomyelitis. No other areas of bone resorption to suggest additional osteomyelitis. Bones are diffusely demineralized. There are prominent dorsal and plantar calcaneal spurs. Soft tissue swelling is seen most  focally adjacent to the proximal fifth metatarsal. IMPRESSION: Osteomyelitis of the lateral base of the left fifth metatarsal. Electronically Signed   By: Lajean Manes M.D.   On: 03/11/2018 20:33   Dg Foot Complete Left  Result Date: 03/07/2018 Please see detailed radiograph report in office note.  [place] Ekg Site Rite  Result Date: 03/15/2018 If Site Rite image not attached, placement could not be confirmed due to current cardiac rhythm.   03/13/2018: Debridement and bone biopsy in the OR   Subjective:   Discharge Exam: Vitals:   03/16/18 0438 03/16/18 0450  BP: 130/64 (!) 136/95  Pulse: 63 80  Resp: 17   Temp: 97.9 F (36.6 C) 98.3 F (36.8 C)  SpO2: 95% 99%   Vitals:   03/15/18 2150 03/15/18 2154 03/16/18 0438 03/16/18 0450  BP: (!) 124/46 (!) 124/46 130/64 (!) 136/95  Pulse: 64 64 63 80  Resp: _0 Temp: 99 F (37.2 C) 99 F (37.2 C) 97.9 F (36.6 C) 98.3 F (36.8 C)  TempSrc: Oral Oral Oral Oral  SpO2: 99% 99% 95% 99%  Weight:      Height:         General exam: Appears calm and comfortable  Respiratory system: Clear to auscultation. Respiratory effort normal. Cardiovascular system: Regular rate and rhythm, no murmurs Gastrointestinal system: Abdomen is nondistended, soft and  nontender. No organomegaly or masses felt. Normal bowel sounds heard. Central nervous system: Grossly intact, moving all extremities Extremities: Trace lower extremity edema r. Skin: Surgical site over left foot is clean dry and intact but dressed, PEG site is clean dry and intact Psychiatry: Judgement and insight appear normal. Mood & affect appropriate.    The results of significant diagnostics from this hospitalization (including imaging, microbiology, ancillary and laboratory) are listed below for reference.     Microbiology: Recent Results (from the past 240 hour(s))  Blood culture (routine x 2)     Status: None   Collection Time: 03/11/18 10:30 PM  Result Value Ref Range Status   Specimen Description BLOOD LEFT ANTECUBITAL  Final   Special Requests   Final    BOTTLES DRAWN AEROBIC AND ANAEROBIC Blood Culture adequate volume   Culture   Final    NO GROWTH 5 DAYS Performed at [facility], [address] [place], [place] [address]    Report Status 03/16/2018 FINAL  Final  Blood culture (routine x 2)     Status: None   Collection Time: 03/11/18 10:35 PM  Result Value Ref Range Status   Specimen Description BLOOD LEFT HAND  Final   Special Requests   Final    BOTTLES DRAWN AEROBIC AND ANAEROBIC Blood Culture adequate volume   Culture   Final    NO GROWTH 5 DAYS Performed at [facility], [place] [address], [place], [place] [address]    Report Status 03/16/2018 FINAL  Final  Surgical pcr screen     Status: None   Collection Time: 03/12/18 10:24 PM  Result Value Ref Range Status   MRSA, PCR NEGATIVE NEGATIVE Final   Staphylococcus aureus NEGATIVE NEGATIVE Final    Comment: (NOTE) The Xpert SA Assay (FDA approved for NASAL specimens in patients 70 years of age and older), is one component of a comprehensive surveillance program. It is not intended to diagnose infection nor to guide or monitor treatment. Performed at [facility], [place] [address]  [place], [place] [address]   Aerobic/Anaerobic Culture (surgical/deep  wound)     Status: None (Preliminary result)   Collection Time: 03/13/18  2:11 PM  Result Value Ref Range Status   Specimen Description BONE LEFT FOOT  Final   Special Requests PATIENT ON FOLLOWING CEFOTAN AND VANC  Final   Gram Stain   Final    NO WBC SEEN FEW GRAM NEGATIVE RODS RARE GRAM VARIABLE ROD    Culture   Final    NO GROWTH 3 DAYS NO ANAEROBES ISOLATED; CULTURE IN PROGRESS FOR 5 DAYS Performed at [facility], [place] [address] [place], [place] [address]    Report Status PENDING  Incomplete     Labs: BNP (last 3 results) No results for input(s): BNP in the last 8760 hours. Basic Metabolic Panel: Recent Labs  Lab 03/11/18 1751 03/12/18 0034 03/13/18 0345  NA 136 134* 135  K 4.0 3.4* 3.8  CL 103 100 102  CO2 _0 GLUCOSE 101* 181* 184*  BUN _1 CREATININE 0.87 0.88 0.77  CALCIUM 10.1 9.6 9.0   Liver Function Tests: Recent Labs  Lab 03/11/18 1751  AST 17  ALT 17  ALKPHOS 73  BILITOT 0.4  PROT 7.7  ALBUMIN 3.8   No results for input(s): LIPASE, AMYLASE in the last 168 hours. No results for input(s): AMMONIA in the last 168 hours. CBC: Recent Labs  Lab 03/11/18 1751 03/12/18 0034 03/13/18 0345  WBC 6.9 8.1 6.7  NEUTROABS 3.9  --   --   HGB 12.8 13.0 11.2*  HCT 42.3 42.8 37.5  MCV 79.2 78.5* 79.8*  PLT 343 323 315   Cardiac Enzymes: No results for input(s): CKTOTAL, CKMB, CKMBINDEX, TROPONINI in the last 168 hours. BNP: Invalid input(s): POCBNP CBG: Recent Labs  Lab 03/15/18 0849 03/15/18 1255 03/15/18 1806 03/15/18 2110 03/16/18 0832  GLUCAP 178* 173* 153* 179* 153*   D-Dimer No results for input(s): DDIMER in the last 72 hours. Hgb A1c No results for input(s): HGBA1C in the last 72 hours. Lipid Profile No results for input(s): CHOL, HDL, LDLCALC, TRIG, CHOLHDL, LDLDIRECT in the last 72 hours. Thyroid function studies No results for input(s): TSH,  T4TOTAL, T3FREE, THYROIDAB in the last 72 hours.  Invalid input(s): FREET3 Anemia work up No results for input(s): VITAMINB12, FOLATE, FERRITIN, TIBC, IRON, RETICCTPCT in the last 72 hours. Urinalysis    Component Value Date/Time   COLORURINE YELLOW 12/14/2016 1720   APPEARANCEUR CLEAR 12/14/2016 1720   LABSPEC 1.016 12/14/2016 1720   PHURINE 5.0 12/14/2016 1720   GLUCOSEU NEGATIVE 12/14/2016 1720   HGBUR NEGATIVE 12/14/2016 1720   BILIRUBINUR NEGATIVE 12/14/2016 1720   KETONESUR NEGATIVE 12/14/2016 1720   PROTEINUR NEGATIVE 12/14/2016 1720   NITRITE NEGATIVE 12/14/2016 1720   LEUKOCYTESUR MODERATE (A) 12/14/2016 1720   Sepsis Labs Invalid input(s): PROCALCITONIN,  WBC,  LACTICIDVEN Microbiology Recent Results (from the past 240 hour(s))  Blood culture (routine x 2)     Status: None   Collection Time: 03/11/18 10:30 PM  Result Value Ref Range Status   Specimen Description BLOOD LEFT ANTECUBITAL  Final   Special Requests   Final    BOTTLES DRAWN AEROBIC AND ANAEROBIC Blood Culture adequate volume   Culture   Final    NO GROWTH 5 DAYS Performed at [facility], [place] [address], [place], [place] [address]    Report Status 03/16/2018 FINAL  Final  Blood culture (routine x 2)     Status: None   Collection Time: 03/11/18  10:35 PM  Result Value Ref Range Status   Specimen Description BLOOD LEFT HAND  Final   Special Requests   Final    BOTTLES DRAWN AEROBIC AND ANAEROBIC Blood Culture adequate volume   Culture   Final    NO GROWTH 5 DAYS Performed at [facility], [address] [place], [place] [address]    Report Status 03/16/2018 FINAL  Final  Surgical pcr screen     Status: None   Collection Time: 03/12/18 10:24 PM  Result Value Ref Range Status   MRSA, PCR NEGATIVE NEGATIVE Final   Staphylococcus aureus NEGATIVE NEGATIVE Final    Comment: (NOTE) The Xpert SA Assay (FDA approved for NASAL specimens in patients 60 years of age and older), is one component  of a comprehensive surveillance program. It is not intended to diagnose infection nor to guide or monitor treatment. Performed at [facility], [place] [address], [place], [place] [address]   Aerobic/Anaerobic Culture (surgical/deep wound)     Status: None (Preliminary result)   Collection Time: 03/13/18  2:11 PM  Result Value Ref Range Status   Specimen Description BONE LEFT FOOT  Final   Special Requests PATIENT ON FOLLOWING CEFOTAN AND VANC  Final   Gram Stain   Final    NO WBC SEEN FEW GRAM NEGATIVE RODS RARE GRAM VARIABLE ROD    Culture   Final    NO GROWTH 3 DAYS NO ANAEROBES ISOLATED; CULTURE IN PROGRESS FOR 5 DAYS Performed at [facility], [place] [address], [place], [place] [address]    Report Status PENDING  Incomplete     Time coordinating discharge: Over 30 minutes  SIGNED:   Cristy Folks, MD  [facility] 03/16/2018, 11:45 AM  If 7PM-7AM, please contact night-coverage [email or web address] Password [email or web address]

## 2018-03-16 NOTE — Progress Notes (Signed)
RT assessed pt and offered to apply CPAP for her. Pt states she would put on herself. RT checked pts CPAP added more sterile water to unit and checked unit to ensure on proper settings. Pt states it is comfortable. RT will continue to monitor.

## 2018-03-16 NOTE — Care Management Note (Signed)
Case Management Note  Patient Details  Name: Olivia Romero MRN: [id number] Date of Birth: 11/18/1955  Subjective/Objective:                    Action/Plan:   Spoke w patient, she would like  To use [facility] for [facility] services. Coordinated w [facility] for [facility] to start tomorrow at 2pm. Patient will need to stay tonight for 2am dose of vanc and DC to be home by 2pm. Patient updated and in agreement of plan.  No other CM needs at this time.   Expected Discharge Date:  03/16/18               Expected Discharge Plan:     In-House Referral:     Discharge planning Services  CM Consult  Post Acute Care Choice:  Durable Medical Equipment, Home Health Choice offered to:     DME Arranged:    DME Agency:     HH Arranged:  RN, IV Antibiotics HH Agency:  [facility]  Status of Service:  Completed, signed off  If discussed at [place] of Stay Meetings, dates discussed:    Additional Comments:  Carles Collet, RN 03/16/2018, 1:36 PM

## 2018-03-16 NOTE — Plan of Care (Signed)
  Problem: Education: Goal: Knowledge of General Education information will improve Description Including pain rating scale, medication(s)/side effects and non-pharmacologic comfort measures Outcome: Progressing   

## 2018-03-16 NOTE — Evaluation (Signed)
Physical Therapy Evaluation Patient Details Name: Olivia Romero MRN: [id number] DOB: 1956-03-09 Today's Date: 03/16/2018   History of Present Illness  Olivia Romero is a 62 y.o. female with medical history significant of HTN, HLD, neuropathy, diabetes mellitus type 2; who presents with complaints of fever and nonhealing ulcer of her left foot, ?charcot.  Pt underwent I&D of wound with bone biopsy on 10/9.  Patient now WBAT in CAM walker for short distances.    Clinical Impression  Patient presents with limitations in gait and mobility due to generalized weakness, CAM walker left LE and decreased balance.  Patient required min assist with mobility with RW.  Recommend f/u PT in [facility] for continued progression of mobility and independence.  Recommend patient with 24 assistance at home until more independent with transfers.      Follow Up Recommendations Home health PT    Equipment Recommendations  None recommended by PT    Recommendations for Other Services       Precautions / Restrictions Precautions Precautions: Fall Required Braces or Orthoses: Other Brace/Splint Other Brace/Splint: CAM walker left foot Restrictions Weight Bearing Restrictions: Yes RLE Weight Bearing: Weight bearing as tolerated(in CAM walker)      Mobility  Bed Mobility               General bed mobility comments: patient sitting on BSC upon arrival; report tech helped to edge of bed  Transfers Overall transfer level: Needs assistance Equipment used: Rolling walker (2 wheeled) Transfers: Sit to/from Stand Sit to Stand: Min assist         General transfer comment: cueing for hand placement and technique  Ambulation/Gait Ambulation/Gait assistance: Min assist Gait Distance (Feet): 15 Feet Assistive device: Rolling walker (2 wheeled) Gait Pattern/deviations: Step-to pattern;Decreased stance time - left;Decreased stride length;Decreased weight shift to left Gait velocity: decreased    General Gait Details: limited gait due to MD orders  Stairs Stairs: (reviewed verbally)          Wheelchair Mobility    Modified Rankin (Stroke Patients Only)       Balance Overall balance assessment: Needs assistance Sitting-balance support: No upper extremity supported;Feet supported Sitting balance-Leahy Scale: Good     Standing balance support: Bilateral upper extremity supported;During functional activity Standing balance-Leahy Scale: Poor Standing balance comment: reliant on RW for balance                             Pertinent Vitals/Pain Pain Assessment: No/denies pain    Home Living Family/patient expects to be discharged to:: Private residence Living Arrangements: Children;Other relatives Available Help at Discharge: Family;Friend(s);Available 24 hours/day Type of Home: House Home Access: Stairs to enter Entrance Stairs-Rails: None Entrance Stairs-Number of Steps: 2 Home Layout: One level Home Equipment: Walker - 4 wheels;Tub bench;Adaptive equipment;Crutches;Walker - 2 wheels      Prior Function Level of Independence: Independent with assistive device(s)         Comments: used crutches for balance     Hand Dominance   Dominant Hand: Right    Extremity/Trunk Assessment   Upper Extremity Assessment Upper Extremity Assessment: Overall WFL for tasks assessed    Lower Extremity Assessment Lower Extremity Assessment: LLE deficits/detail LLE Deficits / Details: ankle ROM/strength not tested due to surgery/immobilization LLE Sensation: history of peripheral neuropathy       Communication   Communication: No difficulties  Cognition Arousal/Alertness: Awake/alert Behavior During Therapy: WFL for tasks assessed/performed Overall Cognitive  Status: Within Functional Limits for tasks assessed                                        General Comments      Exercises     Assessment/Plan    PT Assessment All  further PT needs can be met in the next venue of care  PT Problem List Decreased strength;Decreased range of motion;Decreased activity tolerance;Decreased balance;Decreased mobility;Decreased knowledge of use of DME       PT Treatment Interventions Gait training;Functional mobility training;Balance training;Therapeutic exercise;Therapeutic activities;Patient/family education    PT Goals (Current goals can be found in the Care Plan section)  Acute Rehab PT Goals PT Goal Formulation: All assessment and education complete, DC therapy    Frequency     Barriers to discharge        Co-evaluation               AM-PAC PT "6 Clicks" Daily Activity  Outcome Measure Difficulty turning over in bed (including adjusting bedclothes, sheets and blankets)?: A Little Difficulty moving from lying on back to sitting on the side of the bed? : A Little Difficulty sitting down on and standing up from a chair with arms (e.g., wheelchair, bedside commode, etc,.)?: A Lot Help needed moving to and from a bed to chair (including a wheelchair)?: A Little Help needed walking in hospital room?: A Little Help needed climbing 3-5 steps with a railing? : A Little 6 Click Score: 17    End of Session   Activity Tolerance: Patient tolerated treatment well;No increased pain Patient left: in chair;with call bell/phone within reach Nurse Communication: Mobility status PT Visit Diagnosis: Unsteadiness on feet (R26.81);Other abnormalities of gait and mobility (R26.89);Muscle weakness (generalized) (M62.81);Difficulty in walking, not elsewhere classified (R26.2)    Time: 1207-1229 PT Time Calculation (min) (ACUTE ONLY): 22 min   Charges:   PT Evaluation $PT Eval Moderate Complexity: 1 Mod          03/16/2018 Olivia Romero, PT Acute Rehabilitation Services Pager:  [phone] Office:  [phone]    Shanna Cisco 03/16/2018, 1:38 PM

## 2018-03-16 NOTE — Progress Notes (Signed)
Peripherally Inserted Central Catheter/Midline Placement  The IV Nurse has discussed with the patient and/or persons authorized to consent for the patient, the purpose of this procedure and the potential benefits and risks involved with this procedure.  The benefits include less needle sticks, lab draws from the catheter, and the patient may be discharged home with the catheter. Risks include, but not limited to, infection, bleeding, blood clot (thrombus formation), and puncture of an artery; nerve damage and irregular heartbeat and possibility to perform a PICC exchange if needed/ordered by physician.  Alternatives to this procedure were also discussed.  Bard Power PICC patient education guide, fact sheet on infection prevention and patient information card has been provided to patient /or left at bedside.    PICC/Midline Placement Documentation  PICC Single Lumen 81/10/31 PICC Left Basilic 37 cm 1 cm (Active)  Indication for Insertion or Continuance of Line Home intravenous therapies (PICC only) 03/16/2018 10:00 AM  Exposed Catheter (cm) 1 cm 03/16/2018 10:00 AM  Site Assessment Clean;Dry;Intact 03/16/2018 10:00 AM  Line Status Flushed;Blood return noted;Saline locked 03/16/2018 10:00 AM  Dressing Type Transparent 03/16/2018 10:00 AM  Dressing Status Clean;Dry;Intact;Antimicrobial disc in place 03/16/2018 10:00 AM  Line Care Connections checked and tightened 03/16/2018 10:00 AM  Dressing Change Due 03/23/18 03/16/2018 10:00 AM       Alanson Puls, Keenan Bachelor 03/16/2018, 10:11 AM

## 2018-03-16 NOTE — Progress Notes (Signed)
Case manager made aware of the discharge order and the HHN.

## 2018-03-17 DIAGNOSIS — M86172 Other acute osteomyelitis, left ankle and foot: Secondary | ICD-10-CM | POA: Diagnosis not present

## 2018-03-17 DIAGNOSIS — E11621 Type 2 diabetes mellitus with foot ulcer: Secondary | ICD-10-CM | POA: Diagnosis not present

## 2018-03-17 DIAGNOSIS — Z452 Encounter for adjustment and management of vascular access device: Secondary | ICD-10-CM | POA: Diagnosis not present

## 2018-03-17 DIAGNOSIS — L97529 Non-pressure chronic ulcer of other part of left foot with unspecified severity: Secondary | ICD-10-CM | POA: Diagnosis not present

## 2018-03-17 DIAGNOSIS — D649 Anemia, unspecified: Secondary | ICD-10-CM | POA: Diagnosis not present

## 2018-03-17 DIAGNOSIS — I10 Essential (primary) hypertension: Secondary | ICD-10-CM | POA: Diagnosis not present

## 2018-03-17 LAB — GLUCOSE, CAPILLARY: GLUCOSE-CAPILLARY: 139 mg/dL — AB (ref 70–99)

## 2018-03-17 MED ORDER — HEPARIN SOD (PORK) LOCK FLUSH 100 UNIT/ML IV SOLN
250.0000 [IU] | INTRAVENOUS | Status: AC | PRN
  Administered 2018-03-17: 250 [IU]

## 2018-03-17 NOTE — Care Management (Signed)
Verified with [facility] that Kosse RN will be at patient's home around 2pm to initiate home IV Abx. Spoke w bedside nurse and confirmed that patient will DC in time to be home by then. No other CM needs. Signing off.

## 2018-03-17 NOTE — Progress Notes (Signed)
Olivia Romero to be D/C'd  per MD order. Discussed with the patient and all questions fully answered.  VSS, Skin clean, dry and intact without evidence of skin break down, no evidence of skin tears noted.  An After Visit Summary was printed and given to the patient. Patient received prescription.  D/c education completed with patient/family including follow up instructions, medication list, d/c activities limitations if indicated, with other d/c instructions as indicated by MD - patient able to verbalize understanding, all questions fully answered.   Patient instructed to return to ED, call 911, or call MD for any changes in condition.   Patient to be escorted via [place], and D/C home via private auto.

## 2018-03-18 ENCOUNTER — Telehealth: Payer: Self-pay | Admitting: Family Medicine

## 2018-03-18 ENCOUNTER — Telehealth: Payer: Self-pay | Admitting: *Deleted

## 2018-03-18 DIAGNOSIS — L97529 Non-pressure chronic ulcer of other part of left foot with unspecified severity: Secondary | ICD-10-CM | POA: Diagnosis not present

## 2018-03-18 DIAGNOSIS — M86172 Other acute osteomyelitis, left ankle and foot: Secondary | ICD-10-CM | POA: Diagnosis not present

## 2018-03-18 DIAGNOSIS — I10 Essential (primary) hypertension: Secondary | ICD-10-CM | POA: Diagnosis not present

## 2018-03-18 DIAGNOSIS — Z452 Encounter for adjustment and management of vascular access device: Secondary | ICD-10-CM | POA: Diagnosis not present

## 2018-03-18 DIAGNOSIS — E11621 Type 2 diabetes mellitus with foot ulcer: Secondary | ICD-10-CM | POA: Diagnosis not present

## 2018-03-18 DIAGNOSIS — D649 Anemia, unspecified: Secondary | ICD-10-CM | POA: Diagnosis not present

## 2018-03-18 LAB — AEROBIC/ANAEROBIC CULTURE (SURGICAL/DEEP WOUND)
CULTURE: NO GROWTH
GRAM STAIN: NONE SEEN

## 2018-03-18 LAB — AEROBIC/ANAEROBIC CULTURE W GRAM STAIN (SURGICAL/DEEP WOUND)

## 2018-03-18 NOTE — Telephone Encounter (Signed)
Verbal given, melissa please schedule hosp f/u

## 2018-03-18 NOTE — Telephone Encounter (Signed)
Copied from [place] [id number]. Topic: Quick Communication - Home Health Verbal Orders >> Mar 18, 2018  9:12 AM Conception Chancy, NT wrote: Caller/AgencyRojelio Romero with [place] Number: [phone] Requesting OT/PT/Skilled Nursing/Social Work: Nursing Frequency: 2x a week for 5 weeks and 1x a week for 4 weeks.

## 2018-03-18 NOTE — Telephone Encounter (Signed)
I informed Misty - [facility] Dr. Randie Heinz the recommended skilled nursing schedule.

## 2018-03-18 NOTE — Telephone Encounter (Signed)
[place] request voice orders for skilled nursing 2x week for 5 weeks and 1x week for 4 weeks.

## 2018-03-18 NOTE — Telephone Encounter (Signed)
Okay for orders, but stress that patient needs a face-to-face visit for hospital follow-up We'll also need someone to do the [facility] telephone call Thank you

## 2018-03-19 ENCOUNTER — Encounter: Payer: Self-pay | Admitting: Podiatry

## 2018-03-19 ENCOUNTER — Ambulatory Visit (INDEPENDENT_AMBULATORY_CARE_PROVIDER_SITE_OTHER): Payer: Medicare Other

## 2018-03-19 ENCOUNTER — Ambulatory Visit (INDEPENDENT_AMBULATORY_CARE_PROVIDER_SITE_OTHER): Payer: Medicare Other | Admitting: Podiatry

## 2018-03-19 VITALS — Temp 99.3°F

## 2018-03-19 DIAGNOSIS — L97521 Non-pressure chronic ulcer of other part of left foot limited to breakdown of skin: Secondary | ICD-10-CM

## 2018-03-19 DIAGNOSIS — I10 Essential (primary) hypertension: Secondary | ICD-10-CM | POA: Diagnosis not present

## 2018-03-19 DIAGNOSIS — E11621 Type 2 diabetes mellitus with foot ulcer: Secondary | ICD-10-CM | POA: Diagnosis not present

## 2018-03-19 DIAGNOSIS — Z452 Encounter for adjustment and management of vascular access device: Secondary | ICD-10-CM | POA: Diagnosis not present

## 2018-03-19 DIAGNOSIS — M14672 Charcot's joint, left ankle and foot: Secondary | ICD-10-CM | POA: Diagnosis not present

## 2018-03-19 DIAGNOSIS — M86172 Other acute osteomyelitis, left ankle and foot: Secondary | ICD-10-CM | POA: Diagnosis not present

## 2018-03-19 DIAGNOSIS — L97529 Non-pressure chronic ulcer of other part of left foot with unspecified severity: Secondary | ICD-10-CM | POA: Diagnosis not present

## 2018-03-19 DIAGNOSIS — D649 Anemia, unspecified: Secondary | ICD-10-CM | POA: Diagnosis not present

## 2018-03-19 DIAGNOSIS — M86272 Subacute osteomyelitis, left ankle and foot: Secondary | ICD-10-CM | POA: Diagnosis not present

## 2018-03-19 NOTE — Telephone Encounter (Signed)
Put her on for 12 tomorrow

## 2018-03-19 NOTE — Telephone Encounter (Signed)
Please advise as to where we can schedule this pt. No availabilities at all.

## 2018-03-20 ENCOUNTER — Inpatient Hospital Stay: Payer: BLUE CROSS/BLUE SHIELD | Admitting: Nurse Practitioner

## 2018-03-20 ENCOUNTER — Telehealth: Payer: Self-pay | Admitting: Podiatry

## 2018-03-20 DIAGNOSIS — M86172 Other acute osteomyelitis, left ankle and foot: Secondary | ICD-10-CM | POA: Diagnosis not present

## 2018-03-20 DIAGNOSIS — M869 Osteomyelitis, unspecified: Secondary | ICD-10-CM | POA: Diagnosis not present

## 2018-03-20 DIAGNOSIS — Z452 Encounter for adjustment and management of vascular access device: Secondary | ICD-10-CM | POA: Diagnosis not present

## 2018-03-20 DIAGNOSIS — D649 Anemia, unspecified: Secondary | ICD-10-CM | POA: Diagnosis not present

## 2018-03-20 DIAGNOSIS — E11621 Type 2 diabetes mellitus with foot ulcer: Secondary | ICD-10-CM | POA: Diagnosis not present

## 2018-03-20 DIAGNOSIS — I10 Essential (primary) hypertension: Secondary | ICD-10-CM | POA: Diagnosis not present

## 2018-03-20 DIAGNOSIS — L97529 Non-pressure chronic ulcer of other part of left foot with unspecified severity: Secondary | ICD-10-CM | POA: Diagnosis not present

## 2018-03-20 NOTE — Telephone Encounter (Signed)
Dr. Jacqualyn Posey was getting me home health care through for dressing of my wound. I spoke to the nurse from [place] and they can do it on Monday and Thursday and they need your approval for those days. If someone could call me at [phone].

## 2018-03-21 ENCOUNTER — Encounter: Payer: Self-pay | Admitting: Nurse Practitioner

## 2018-03-21 ENCOUNTER — Ambulatory Visit: Payer: BLUE CROSS/BLUE SHIELD | Admitting: Nurse Practitioner

## 2018-03-21 VITALS — BP 120/60 | HR 95 | Temp 99.1°F | Resp 16 | Ht 60.0 in | Wt 193.0 lb

## 2018-03-21 DIAGNOSIS — L97426 Non-pressure chronic ulcer of left heel and midfoot with bone involvement without evidence of necrosis: Secondary | ICD-10-CM | POA: Diagnosis not present

## 2018-03-21 DIAGNOSIS — Z09 Encounter for follow-up examination after completed treatment for conditions other than malignant neoplasm: Secondary | ICD-10-CM

## 2018-03-21 DIAGNOSIS — E11621 Type 2 diabetes mellitus with foot ulcer: Secondary | ICD-10-CM | POA: Diagnosis not present

## 2018-03-21 NOTE — Patient Instructions (Signed)
-   Continue to drink plenty of water, elevate foot when resting, keep CAM boot on and keep all follow up appointments.  - Please immediately alert podiatry if having fevers, increased swelling.  - Please go to ER if you have fevers greater than 100.34F, shortness of breath, weakness, swelling or increased drainage of foot.

## 2018-03-21 NOTE — Progress Notes (Signed)
Name: Olivia Romero   MRN: [id number]    DOB: 1955/11/07   Date:03/21/2018       Progress Note  Subjective  Chief Complaint  Chief Complaint  Patient presents with  . Hospitalization Follow-up    HPI  Patient presents for hospital follow-up. Patient was admitted on 03/11/2018 with fevers and chills and left non-healing infected plantar surface diabetic foot ulcer. She had trialed three oral antibiotics prior to admission. She was started on IV antibiotics and MRI done on admission was equivocal for osteo-versus Charcot joint. Podiatry was consulted and Patient was taken to the operating room on 03/13/2018 with debridement of wound and culture and Gram stain was obtained.  There is no evidence of osteomyelitis on biopsy of the bone.  It did not probe to bone.  Gram stain showed evidence of GNR's and rare gram-positive rods.  Infectious disease was consulted and recommended 4 weeks of IV vancomycin, ceftriaxone, oral metronidazole. Patient has podiatry appointment tomorrow and infectious disease appointment on 04/16/2018. Patient is receiving home health nursing, OT and PT.   Aim for Vancomycin trough 15-20  PICC Care Per Protocol: Labs weekly while on IV antibiotics: _x_ CBC with differential _x_ CMP _x_ CRP __x ESR _x_ Vancomycin trough __x Please pull PIC at completion of IV antibiotics  Has nurse coming to the house to administer rocephin once daily and vancomycin BID. She talked to OT and PT states she had similar issues with other foot in the past and discussed with them that she did not need their services because she will do the exercises at home as she has previously done.  No fevers, chills- checking daily at home. She saw Dr. Jacqualyn Posey already on Monday- states was looking much improved. Patient has neuropathy so she does not feel pain there. States nursing staff is drawing lab levels and being monitored.    Patient Active Problem List   Diagnosis Date Noted  . BMI  36.0-36.9,adult   . Osteomyelitis ([place]) 03/11/2018  . Myalgia due to statin 11/19/2017  . Morbid obesity ([place]) 11/19/2017  . Uncontrolled type 2 diabetes mellitus with hyperglycemia ([place]) 11/01/2017  . B12 deficiency 11/01/2017  . Skin ulcer of right ankle, limited to breakdown of skin ([place]) 12/31/2016  . Diabetic foot ulcer ([place]) 12/14/2016  . Diabetic polyneuropathy associated with type 2 diabetes mellitus ([place]) 12/13/2015  . Lumbar stenosis with neurogenic claudication 09/02/2015  . 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 ([place]) 07/13/2015  . Chronic constipation 07/13/2015  . OSA (obstructive sleep apnea) 06/25/2015  . 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/23/2015  . Hypertriglyceridemia 04/23/2015  . Statin intolerance 04/23/2015  . Asthma, mild intermittent 02/01/2015  . Type 2 diabetes mellitus with diabetic nephropathy ([place]) 12/31/2014  . Esophagitis 12/31/2014  . Dysphagia 12/31/2014  . Bilateral carpal tunnel syndrome 12/01/2014  . Benign neoplasm of colon 11/30/2012  . Diverticulosis of colon (without mention of hemorrhage) 11/30/2012  . Essential hypertension, benign 11/28/2012    Past Medical History:  Diagnosis Date  . Anemia   . Arthritis   . Asthma   . Carpal tunnel syndrome on right   . Colon polyps    adenomatous  . Diabetes mellitus without complication ([place])   . Diverticulosis of colon   . Esophagitis   . GERD (gastroesophageal reflux disease)   . Hemorrhoid    internal  . Hyperlipemia   . Hypertension   . Neuropathy   . Pneumonia   . PONV (postoperative nausea and vomiting)   .  Sleep apnea    has had in the past lost 50 pounds and do longer uses cpap  . Stroke-like episode ([place]) 2009   TIA    Past Surgical History:  Procedure Laterality Date  . ABDOMINAL HYSTERECTOMY    . BACK SURGERY  09/02/2015  . CERVICAL FUSION    . CESAREAN SECTION  1986  . COLONOSCOPY N/A 11/30/2012   Procedure: COLONOSCOPY;  Surgeon:  Irene Shipper, MD;  Location: [facility] ENDOSCOPY;  Service: Endoscopy;  Laterality: N/A;  . COLONOSCOPY WITH PROPOFOL N/A 01/03/2016   Procedure: COLONOSCOPY WITH PROPOFOL;  Surgeon: Manya Silvas, MD;  Location: [facility] ENDOSCOPY;  Service: Endoscopy;  Laterality: N/A;  . ESOPHAGOGASTRODUODENOSCOPY (EGD) WITH PROPOFOL N/A 01/14/2015   Procedure: ESOPHAGOGASTRODUODENOSCOPY (EGD) WITH PROPOFOL;  Surgeon: Manya Silvas, MD;  Location: [facility] ENDOSCOPY;  Service: Endoscopy;  Laterality: N/A;  . IRRIGATION AND DEBRIDEMENT FOOT Left 03/13/2018   Procedure: IRRIGATION AND DEBRIDEMENT FOOT WITH BONE BIOPSY WITH MISONIX DEBRIDER;  Surgeon: Evelina Bucy, DPM;  Location: [facility];  Service: Podiatry;  Laterality: Left;  . LUMBAR WOUND DEBRIDEMENT N/A 10/01/2015   Procedure: LUMBAR WOUND DEBRIDEMENT;  Surgeon: Ashok Pall, MD;  Location: [place] NEURO ORS;  Service: Neurosurgery;  Laterality: N/A;  LUMBAR WOUND DEBRIDEMENT  . SAVORY DILATION N/A 01/14/2015   Procedure: SAVORY DILATION;  Surgeon: Manya Silvas, MD;  Location: [facility] ENDOSCOPY;  Service: Endoscopy;  Laterality: N/A;  . TONSILLECTOMY      Social History   Tobacco Use  . Smoking status: Never Smoker  . Smokeless tobacco: Never Used  Substance Use Topics  . Alcohol use: Never    Frequency: Never     Current Outpatient Medications:  .  acetaminophen (TYLENOL) 500 MG tablet, Take 1,000 mg by mouth every 6 (six) hours as needed for mild pain. , Disp: , Rfl:  .  albuterol (PROVENTIL HFA;VENTOLIN HFA) 108 (90 Base) MCG/ACT inhaler, Inhale 2 puffs into the lungs every 6 (six) hours as needed for wheezing or shortness of breath., Disp: 1 Inhaler, Rfl: 3 .  aspirin EC 325 MG tablet, Take 1 tablet (325 mg total) by mouth daily., Disp: 30 tablet, Rfl: 0 .  Blood Glucose Monitoring Suppl (CONTOUR NEXT MONITOR) w/Device KIT, as directed. , Disp: , Rfl:  .  cefTRIAXone (ROCEPHIN) IVPB, Inject 2 g into the vein daily. Indication:  Osteomyelitis  Last Day of  Therapy:  04/13/18 Labs - Once weekly:  CBC/D and BMP, Labs - Every other week:  ESR and CRP, Disp: 29 Units, Rfl: 0 .  DULoxetine (CYMBALTA) 60 MG capsule, Take 60 mg by mouth at bedtime., Disp: , Rfl:  .  fenofibrate (TRICOR) 145 MG tablet, Take 145 mg by mouth daily., Disp: , Rfl: 10 .  gabapentin (NEURONTIN) 400 MG capsule, Take 800 mg by mouth 3 (three) times daily. , Disp: , Rfl:  .  glimepiride (AMARYL) 2 MG tablet, Take 2 mg by mouth 2 (two) times daily. , Disp: , Rfl:  .  glucose blood (CONTOUR NEXT TEST) test strip, as directed. , Disp: , Rfl:  .  hydrochlorothiazide (HYDRODIURIL) 12.5 MG tablet, Take 12.5 mg by mouth daily. , Disp: , Rfl:  .  insulin degludec (TRESIBA FLEXTOUCH) 100 UNIT/ML SOPN FlexTouch Pen, Inject 20 Units into the skin at bedtime., Disp: , Rfl:  .  Insulin Degludec (TRESIBA FLEXTOUCH) 200 UNIT/ML SOPN, Inject 20 Units into the skin at bedtime. , Disp: , Rfl:  .  INVOKAMET XR [phone] MG TB24, Take  2 tablets by mouth at bedtime. , Disp: , Rfl: 10 .  metroNIDAZOLE (FLAGYL) 500 MG tablet, Take 1 tablet (500 mg total) by mouth 3 (three) times daily., Disp: 87 tablet, Rfl: 0 .  Multiple Vitamin (MULTIVITAMIN WITH MINERALS) TABS, Take 1 tablet by mouth daily. Reported on 07/21/2015, Disp: , Rfl:  .  pantoprazole (PROTONIX) 40 MG tablet, Take 1 tablet (40 mg total) by mouth daily., Disp: 30 tablet, Rfl: 1 .  torsemide (DEMADEX) 20 MG tablet, Take 1 tablet (20 mg total) by mouth every other day., Disp: 15 tablet, Rfl: 2 .  vancomycin IVPB, Inject 1,000 mg into the vein every 12 (twelve) hours. Indication:  Osteomyelitis Last Day of Therapy:  04/13/18 Labs - Sunday/Monday:  CBC/D, BMP, and vancomycin trough. Labs - Thursday:  BMP and vancomycin trough Labs - Every other week:  ESR and CRP, Disp: 58 Units, Rfl: 0  Current Facility-Administered Medications:  .  albuterol (PROVENTIL) (2.5 MG/3ML) 0.083% nebulizer solution 2.5 mg, 2.5 mg, Nebulization, Once, Brandn Mcgath, Bethel Born,  NP  Allergies  Allergen Reactions  . Eggs Or Egg-Derived Products Swelling and Other (See Comments)    Angioedema  . Atorvastatin Other (See Comments)    Liver toxicity  . Flu Virus Vaccine Swelling    Arm swelled (site of injection)  . Latex Itching  . Pravastatin Itching and Rash  . Tape Rash and Other (See Comments)    TAPE PULLS OFF THE SKIN!!! Please use an alternative!!    ROS   No other specific complaints in a complete review of systems (except as listed in HPI above).  Objective  Vitals:   03/21/18 1120  BP: 120/60  Pulse: 95  Resp: 16  Temp: 99.1 F (37.3 C)  TempSrc: Oral  SpO2: 97%  Weight: 193 lb (87.5 kg)  Height: 5' (1.524 m)    Body mass index is 37.69 kg/m.  Nursing Note and Vital Signs reviewed.  Physical Exam  Constitutional: She is oriented to person, place, and time. She appears well-developed and well-nourished.  HENT:  Head: Normocephalic and atraumatic.  Neck: Normal range of motion. Neck supple. Carotid bruit is not present.  Cardiovascular: Normal heart sounds.  Intact radial pulses   Pulmonary/Chest: Effort normal and breath sounds normal.  Abdominal: Soft. Bowel sounds are normal. There is no tenderness. There is no CVA tenderness.  Musculoskeletal:  Using walker, left foot in cam walker, Reviewed picture from Dr. Willia Craze note from 10/15 no drainage through dressing today per patient.   Neurological: She is alert and oriented to person, place, and time. She has normal strength. No sensory deficit. GCS eye subscore is 4. GCS verbal subscore is 5. GCS motor subscore is 6.  Skin: Skin is warm, dry and intact. Capillary refill takes less than 2 seconds.  Psychiatric: She has a normal mood and affect. Her speech is normal and behavior is normal. Judgment and thought content normal.  Vitals reviewed.     No results found for this or any previous visit (from the past 48 hour(s)).  Assessment & Plan  1. Hospital discharge  follow-up Medications reconciled.  Appears to be doing well, no signs of sepsis, has follow-up with podiatry tomorrow.   2. 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 Discussed ER precautions.

## 2018-03-22 ENCOUNTER — Telehealth: Payer: Self-pay | Admitting: *Deleted

## 2018-03-22 ENCOUNTER — Ambulatory Visit: Payer: Medicare Other | Admitting: Podiatry

## 2018-03-22 ENCOUNTER — Encounter: Payer: Self-pay | Admitting: Podiatry

## 2018-03-22 VITALS — Temp 98.4°F

## 2018-03-22 DIAGNOSIS — L97521 Non-pressure chronic ulcer of other part of left foot limited to breakdown of skin: Secondary | ICD-10-CM

## 2018-03-22 MED ORDER — SILVER SULFADIAZINE 1 % EX CREA: 1.0000 "application " | TOPICAL_CREAM | Freq: Every day | CUTANEOUS | 0 refills | Status: DC

## 2018-03-22 NOTE — Telephone Encounter (Signed)
Dr. Jacqualyn Posey ordered silvadene with dry sterile dressing to left foot on Monday and Thursday with [facility]. Orders faxed to [facility].

## 2018-03-22 NOTE — Patient Instructions (Signed)
Apply silvadene to the wound on the left foot daily. Apply a non-adherant dressing followed by a dry sterile dressing and ACE bandage. Keep the foot elevated. Limit walking If you need anything give me a call at [phone]

## 2018-03-23 NOTE — Progress Notes (Signed)
Subjective: 62 year old female presents the office today for postop visit #1 status post left foot wound debridement, bone biopsy.  Gram stain was positive however culture was negative on the bone culture.  She was discharged home from the hospital with antibiotics for 4 weeks including vancomycin, ceftriaxone, Flagyl.  She is tolerating well with any complications.  She is remained in the cam boot she utilizes a walker to help take pressure off of her foot.  Overall she feels well and denies any systemic complaints such as fevers, chills, nausea, vomiting. No acute changes since last appointment, and no other complaints at this time.   Objective: AAO x3, NAD DP/PT pulses palpable bilaterally, CRT less than 3 seconds Ulceration along the lateral aspect of the foot measures 2.5 x 1.8 cm with a granular base with a small amount of fibrotic tissue.  There is no probing to bone there is no exposed bone.  The area of the biopsy sites appear to be healed.  There is no fluctuation or crepitation.  There is a faint rim of erythema there is no significant ascending sialitis.  No significant increase in warmth of the foot.  There is no malodor.  Significant varus deformity present in the ankle and foot. No open lesions or pre-ulcerative lesions.  No pain with calf compression, swelling, warmth, erythema  Assessment: Ulceration left lateral foot with osteomyelitis.  Plan: -All treatment options discussed with the patient including all alternatives, risks, complications.  -Although the culture was negative did show bacteria on Gram stain.  Continue antibiotics per infectious disease. -Today debrided the wound was #312 blade scalpel without any complications. Medihoney was applied.  She can change the bandages daily at home also try to get home health set up for her.  They are ready coming for her PICC line but they can do the dressing changes as well. -Continue cam boot and limit activity level. -Monitor for  any clinical signs or symptoms of infection and directed to call the office immediately should any occur or go to the ER. -Patient encouraged to call the office with any questions, concerns, change in symptoms.   Trula Slade DPM

## 2018-03-23 NOTE — Progress Notes (Signed)
Subjective: 62 year old female presents the office today for postop visit #2 status post left foot wound debridement, bone biopsy.  Gram stain was positive however culture was negative on the bone culture.  She was discharged home from the hospital with antibiotics for 4 weeks including vancomycin, ceftriaxone, Flagyl.  [facility] is managing the PICC line. She has an appointment with ID on 04/16/2018.  She presents today for dressing change.  Overall he is doing well she states that her sugars come down just checks it regularly and I suspect that the last saw her was 99.1.  Denies any systemic complaints such as fevers, chills, nausea, vomiting. No acute changes since last appointment, and no other complaints at this time.   Objective: AAO x3, NAD DP/PT pulses palpable bilaterally, CRT less than 3 seconds Ulceration along the lateral aspect of the foot measures 2.5 x 1.5 cm with a granular base with a small amount of fibrotic tissue prior to debridement.  There is no probing to bone there is no exposed bone.  The area of the biopsy sites appear to be healed.  There is no fluctuation or crepitation.  There is a faint rim of erythema there is no significant ascending cellulitis.  Minimal increase in warmth surrounding the wound but not to the foot.  No significant increase in warmth of the foot.  There is no malodor.  Significant varus deformity present in the ankle and foot. No open lesions or pre-ulcerative lesions.  No pain with calf compression, swelling, warmth, erythema  Assessment: Ulceration left lateral foot with osteomyelitis.  Plan: -All treatment options discussed with the patient including all alternatives, risks, complications.  -Continue antibiotics per infectious disease. -I sharply debrided the wound today utilizing #312 blade scalpel to remove all nonviable tissue down to healthy, bleeding, granular tissue.  Switch to Silvadene dressing changes daily.  Prescriptions given  today for this.  Also we will [facility] to [facility] to have them change the bandage twice a week she feels that she can change the bandage otherwise.  I will also continue to see her once a week for dressing change. -Monitor for any clinical signs or symptoms of infection and directed to call the office immediately should any occur or go to the ER.  Trula Slade DPM

## 2018-03-25 ENCOUNTER — Ambulatory Visit (INDEPENDENT_AMBULATORY_CARE_PROVIDER_SITE_OTHER): Payer: BLUE CROSS/BLUE SHIELD | Admitting: Podiatry

## 2018-03-25 ENCOUNTER — Ambulatory Visit (INDEPENDENT_AMBULATORY_CARE_PROVIDER_SITE_OTHER): Payer: Medicare Other

## 2018-03-25 VITALS — Temp 99.4°F

## 2018-03-25 DIAGNOSIS — L97521 Non-pressure chronic ulcer of other part of left foot limited to breakdown of skin: Secondary | ICD-10-CM | POA: Diagnosis not present

## 2018-03-25 DIAGNOSIS — Z452 Encounter for adjustment and management of vascular access device: Secondary | ICD-10-CM | POA: Diagnosis not present

## 2018-03-25 DIAGNOSIS — I10 Essential (primary) hypertension: Secondary | ICD-10-CM | POA: Diagnosis not present

## 2018-03-25 DIAGNOSIS — M86172 Other acute osteomyelitis, left ankle and foot: Secondary | ICD-10-CM | POA: Diagnosis not present

## 2018-03-25 DIAGNOSIS — L97529 Non-pressure chronic ulcer of other part of left foot with unspecified severity: Secondary | ICD-10-CM | POA: Diagnosis not present

## 2018-03-25 DIAGNOSIS — M86272 Subacute osteomyelitis, left ankle and foot: Secondary | ICD-10-CM

## 2018-03-25 DIAGNOSIS — M869 Osteomyelitis, unspecified: Secondary | ICD-10-CM | POA: Diagnosis not present

## 2018-03-25 DIAGNOSIS — E11621 Type 2 diabetes mellitus with foot ulcer: Secondary | ICD-10-CM | POA: Diagnosis not present

## 2018-03-25 DIAGNOSIS — D649 Anemia, unspecified: Secondary | ICD-10-CM | POA: Diagnosis not present

## 2018-03-26 ENCOUNTER — Telehealth: Payer: Self-pay | Admitting: *Deleted

## 2018-03-26 ENCOUNTER — Encounter: Payer: Self-pay | Admitting: Family Medicine

## 2018-03-26 DIAGNOSIS — M86272 Subacute osteomyelitis, left ankle and foot: Secondary | ICD-10-CM

## 2018-03-26 DIAGNOSIS — L02619 Cutaneous abscess of unspecified foot: Secondary | ICD-10-CM

## 2018-03-26 DIAGNOSIS — M84375D Stress fracture, left foot, subsequent encounter for fracture with routine healing: Secondary | ICD-10-CM

## 2018-03-26 DIAGNOSIS — L02612 Cutaneous abscess of left foot: Secondary | ICD-10-CM

## 2018-03-26 DIAGNOSIS — M14672 Charcot's joint, left ankle and foot: Secondary | ICD-10-CM

## 2018-03-26 DIAGNOSIS — L03032 Cellulitis of left toe: Secondary | ICD-10-CM

## 2018-03-26 DIAGNOSIS — L03119 Cellulitis of unspecified part of limb: Secondary | ICD-10-CM

## 2018-03-26 DIAGNOSIS — L97521 Non-pressure chronic ulcer of other part of left foot limited to breakdown of skin: Secondary | ICD-10-CM

## 2018-03-26 NOTE — Telephone Encounter (Signed)
Called and spoke with the patient and the patient stated that she was doing good and there was little draining and there was no fever and I stated to let us know if any concerns or questions at [phone]. [name]

## 2018-03-26 NOTE — Telephone Encounter (Signed)
Faxed required form, clinicals and demographics to [facility].

## 2018-03-26 NOTE — Progress Notes (Signed)
Subjective: 62 year old female presents the office today as an acute appointment.  She called last night stating that she had some increase in swelling or redness to the foot.  She states that she went to church.  In the morning before surgery to her she has a small amount of drainage on the bandage when she came back there is a lot of drainage on the bandage that she changed it.  She noticed some swelling and increase in some pinkness in color to her foot.  Have her come to the office for further evaluation.  I spoke to her last night her temperature was 98.4.  She denies any fevers, chills, nausea, vomiting.  Denies any calf pain, chest pain, shortness of breath.  She was discharged home from the hospital with antibiotics for 4 weeks including vancomycin, ceftriaxone, Flagyl.  [facility] is managing the PICC line. She has an appointment with ID on 04/16/2018.  Denies any systemic complaints such as fevers, chills, nausea, vomiting. No acute changes since last appointment, and no other complaints at this time.   Objective: AAO x3, NAD DP/PT pulses palpable bilaterally, CRT less than 3 seconds Ulceration along the lateral aspect of the foot measures 2.5 x 1.5 cm with more granulation tissue present today.  Just inferior to this area more than the plantar aspect there is a hyperkeratotic lesion to this area which is new.  On the central aspect of the wound it does probe it is getting very close to bone.  It does undermine somewhat approximately 1 cm plantarly.  There is no purulence identified there is no drainage from this area.  There is no significant increase in edema to the right foot.  There is very faint rim of erythema tracking to the wound and there is no increase in warmth.  Overall the color, temperature, swelling appears to be equal compared to what it was when I last saw her.  However I am concerned that there is no probing to the wound. No open lesions or pre-ulcerative lesions.  No  pain with calf compression, swelling, warmth, erythema  Assessment: Ulceration left lateral foot with osteomyelitis.  Plan: -All treatment options discussed with the patient including all alternatives, risks, complications.  -X-rays were obtained reviewed.  I am concerned that there is some cortical changes to the lateral aspect of the fifth metatarsal base concerning for osteomyelitis.  No soft tissue emphysema present. -We long discussion regards to treatment options.  She is hesitant to do surgery.  I discussed with her fifth ray resection.  She is concerned about losing her toe but I do not think that if we just take out the base of the fifth metatarsal is going to have much function as can cause further issues down the line.  Right now would continue the antibiotics we will continue Betadine wet-to-dry dressing changes to the wound daily.  I want her to keep an eye on her temperature and check a daily and if it goes above what it is currently she is to call the office or go immediately to the emergency room.  I will see her back on Friday for further evaluation.  Also will try for wound care consult.  Trula Slade DPM

## 2018-03-26 NOTE — Telephone Encounter (Signed)
-----   Message from Trula Slade, DPM sent at 03/26/2018  8:01 AM EDT ----- Can you put in for a wound care consult for her? When she was discharged from the hospital we asked for the hospital to do so but I don't think it was done. Thanks.

## 2018-03-27 ENCOUNTER — Telehealth: Payer: Self-pay | Admitting: Podiatry

## 2018-03-27 NOTE — Telephone Encounter (Signed)
Dr. Jacqualyn Posey states my instructions to pt were fine and also add that pt may remove the boot to rest. I informed pt of Dr. Leigh Aurora additional instructions.

## 2018-03-27 NOTE — Telephone Encounter (Signed)
Left message informing [name] - [facility] to draw Dr. Leigh Aurora additional labs.

## 2018-03-27 NOTE — Telephone Encounter (Signed)
Pt is in severe pain in her heel this morning and pt stated that Dr. Jacqualyn Posey asked her to call once she has any problems. Please give her a call, she has been up all night. She currently has an appointment on 03/29/2018 @ 7:30am

## 2018-03-27 NOTE — Telephone Encounter (Signed)
This is [name], RN with [facility]. [name] saw Dr. Jacqualyn Posey Monday morning and he had [place] add estimated sed rate and c-reactive protein to check her antiinflammatory markers. I see the pt tomorrow and I didn't know if Dr. Jacqualyn Posey wanted me to re-draw to check those markers. Please call me back at [phone] and leave me a voicemail if I don't answer. Thank you.

## 2018-03-27 NOTE — Telephone Encounter (Addendum)
I called pt, she states started to have a sharp pain shooting through the heel now continues to have a pain even when not standing. Pt denies fever, redness or skin breakdown, although she does have increase drainage on the dressing. Pt states the pain feels like it is inside at the bottom center of the heel. I told pt it sounded like she had some stress to the heel from midfoot being lifted with the bulky dressing and the heel being forced down harder into the boot. I told pt to place padding in the heel of the boot to lift the heel and take pain medication she had on hand. Pt states she is taking tylenol. I told pt to alternated OTC ibuprofen as the package instructs if she is able to tolerate and I would call with further instructions from Dr. Jacqualyn Posey.

## 2018-03-28 DIAGNOSIS — I10 Essential (primary) hypertension: Secondary | ICD-10-CM | POA: Diagnosis not present

## 2018-03-28 DIAGNOSIS — D649 Anemia, unspecified: Secondary | ICD-10-CM | POA: Diagnosis not present

## 2018-03-28 DIAGNOSIS — L97529 Non-pressure chronic ulcer of other part of left foot with unspecified severity: Secondary | ICD-10-CM | POA: Diagnosis not present

## 2018-03-28 DIAGNOSIS — M8610 Other acute osteomyelitis, unspecified site: Secondary | ICD-10-CM | POA: Diagnosis not present

## 2018-03-28 DIAGNOSIS — M86172 Other acute osteomyelitis, left ankle and foot: Secondary | ICD-10-CM | POA: Diagnosis not present

## 2018-03-28 DIAGNOSIS — E11621 Type 2 diabetes mellitus with foot ulcer: Secondary | ICD-10-CM | POA: Diagnosis not present

## 2018-03-28 DIAGNOSIS — Z452 Encounter for adjustment and management of vascular access device: Secondary | ICD-10-CM | POA: Diagnosis not present

## 2018-03-29 ENCOUNTER — Telehealth: Payer: Self-pay | Admitting: *Deleted

## 2018-03-29 ENCOUNTER — Encounter: Payer: Self-pay | Admitting: Podiatry

## 2018-03-29 ENCOUNTER — Ambulatory Visit: Payer: Medicare Other | Admitting: Podiatry

## 2018-03-29 VITALS — Temp 97.1°F

## 2018-03-29 DIAGNOSIS — L97521 Non-pressure chronic ulcer of other part of left foot limited to breakdown of skin: Secondary | ICD-10-CM | POA: Diagnosis not present

## 2018-03-29 DIAGNOSIS — M86272 Subacute osteomyelitis, left ankle and foot: Secondary | ICD-10-CM

## 2018-03-29 NOTE — Progress Notes (Signed)
Subjective: 62 year old female presents the office today as an acute appointment.  She thinks that overall the wound is doing much better.  Home health has been coming to the house that she has been putting Betadine on the wound daily.  She denies any drainage or pus coming from the area she denies any significant redness or swelling.  She is been taking her temperature regularly and has been coming down has been around 97 to 98 degrees.  She contracted her foot elevate as much as possible transfer off of her foot.  Overall she think she is doing much better.    She has an upcoming appointment with wound care next Thursday.  She was discharged home from the hospital with antibiotics for 4 weeks including vancomycin, ceftriaxone, Flagyl.  [facility] is managing the PICC line. She has an appointment with ID on 04/16/2018.  Denies any systemic complaints such as fevers, chills, nausea, vomiting. No acute changes since last appointment, and no other complaints at this time.   Objective: AAO x3, NAD DP/PT pulses palpable bilaterally, CRT less than 3 seconds Ulceration along the lateral aspect of the foot measures 2.5 x 2 cm with more granulation tissue present today.  Although the wound is slightly larger today it is more annular.  Last time it appeared to be more oblong.  Today there is no probing and this appears to be filling in very nicely.  Faint rim of erythema this is more from inflammation as opposed to infection and there is no ascending cellulitis.  There is no fluctuation or crepitation or any malodor.  Small amount of bloody drainage on the bandage but overall there is no significant drainage and there is no purulence.  There is no fluctuation or crepitation or any malodor. The wounds that she had previously on the right lower extremity are still healed.  She did relate that she did twist her ankle yesterday she had some discomfort yesterday but that resolved and she has not had any  swelling. No open lesions or pre-ulcerative lesions.  No pain with calf compression, swelling, warmth, erythema        Assessment: Ulceration left lateral foot with osteomyelitis; improvement   Plan: -All treatment options discussed with the patient including all alternatives, risks, complications.  -I sharply debrided the ulcer today with a #312 blade scalpel without any complications to healthy, granular tissue.  -I will follow-up on her blood work that she had done on Monday.  I have not yet received the results of this.  Recommend continue Betadine wet-to-dry dressing changes of the wound daily.  New orders for home care were given today.  Today we also had a long discussion regards to treatment options.  I do think that 1/5 ray resection would be warranted however the wound is improving clinically she is much better.  She is in the see wound care next Thursday and will follow with her on Friday.  Discussed that there is any worsening regular go to the fifth ray resection and she understands this.  I want her to keep an eye on her temperature and take this on a regular basis as well. Elevation. New CAM boot was dispensed. -I am concerned about her right side and she is putting more pressure on that side.  She has a Tri-Lock ankle brace at home and I want her to wear that for some stability to help prevent any issues on the right side.  Trula Slade DPM

## 2018-03-29 NOTE — Telephone Encounter (Signed)
[facility] faxed 03/28/2018 labs to our office. Dr. Jacqualyn Posey evaluated, states vancomycin level is elevated, make sure it is being adjusted by [facility]

## 2018-03-29 NOTE — Telephone Encounter (Signed)
Dr. Jacqualyn Posey ordered cancelled silvadene to the left foot ulcer, change to betadine dressings every other day. Faxed to [facility].

## 2018-03-29 NOTE — Telephone Encounter (Signed)
I called [facility], spoke with [name], pharmacist states 16.7 Vancomycin is perfect although it shows as high. I informed Dr. Jacqualyn Posey.

## 2018-03-29 NOTE — Telephone Encounter (Signed)
Dr. Jacqualyn Posey states 03/28/2018 labs are with in normal limits. Left message informing pt of Dr. Leigh Aurora review of 03/28/2018 blood work.

## 2018-03-29 NOTE — Telephone Encounter (Signed)
Thanks, just wanted to follow up on it. Please let Hasel know her look work looks good. Thanks.

## 2018-04-01 DIAGNOSIS — Z452 Encounter for adjustment and management of vascular access device: Secondary | ICD-10-CM | POA: Diagnosis not present

## 2018-04-01 DIAGNOSIS — L97529 Non-pressure chronic ulcer of other part of left foot with unspecified severity: Secondary | ICD-10-CM | POA: Diagnosis not present

## 2018-04-01 DIAGNOSIS — E11621 Type 2 diabetes mellitus with foot ulcer: Secondary | ICD-10-CM | POA: Diagnosis not present

## 2018-04-01 DIAGNOSIS — M86172 Other acute osteomyelitis, left ankle and foot: Secondary | ICD-10-CM | POA: Diagnosis not present

## 2018-04-01 DIAGNOSIS — I10 Essential (primary) hypertension: Secondary | ICD-10-CM | POA: Diagnosis not present

## 2018-04-01 DIAGNOSIS — D649 Anemia, unspecified: Secondary | ICD-10-CM | POA: Diagnosis not present

## 2018-04-02 DIAGNOSIS — E11621 Type 2 diabetes mellitus with foot ulcer: Secondary | ICD-10-CM | POA: Diagnosis not present

## 2018-04-02 DIAGNOSIS — L97529 Non-pressure chronic ulcer of other part of left foot with unspecified severity: Secondary | ICD-10-CM | POA: Diagnosis not present

## 2018-04-02 DIAGNOSIS — M86172 Other acute osteomyelitis, left ankle and foot: Secondary | ICD-10-CM | POA: Diagnosis not present

## 2018-04-02 DIAGNOSIS — Z452 Encounter for adjustment and management of vascular access device: Secondary | ICD-10-CM | POA: Diagnosis not present

## 2018-04-02 DIAGNOSIS — I10 Essential (primary) hypertension: Secondary | ICD-10-CM | POA: Diagnosis not present

## 2018-04-02 DIAGNOSIS — D649 Anemia, unspecified: Secondary | ICD-10-CM | POA: Diagnosis not present

## 2018-04-03 ENCOUNTER — Encounter: Payer: Self-pay | Admitting: Family Medicine

## 2018-04-03 DIAGNOSIS — M86172 Other acute osteomyelitis, left ankle and foot: Secondary | ICD-10-CM | POA: Diagnosis not present

## 2018-04-03 DIAGNOSIS — I10 Essential (primary) hypertension: Secondary | ICD-10-CM | POA: Diagnosis not present

## 2018-04-03 DIAGNOSIS — E11621 Type 2 diabetes mellitus with foot ulcer: Secondary | ICD-10-CM | POA: Diagnosis not present

## 2018-04-03 DIAGNOSIS — Z452 Encounter for adjustment and management of vascular access device: Secondary | ICD-10-CM | POA: Diagnosis not present

## 2018-04-03 DIAGNOSIS — D649 Anemia, unspecified: Secondary | ICD-10-CM | POA: Diagnosis not present

## 2018-04-03 DIAGNOSIS — L97529 Non-pressure chronic ulcer of other part of left foot with unspecified severity: Secondary | ICD-10-CM | POA: Diagnosis not present

## 2018-04-04 ENCOUNTER — Ambulatory Visit (INDEPENDENT_AMBULATORY_CARE_PROVIDER_SITE_OTHER): Payer: Medicare Other

## 2018-04-04 ENCOUNTER — Encounter: Payer: Medicare Other | Admitting: Physician Assistant

## 2018-04-04 ENCOUNTER — Ambulatory Visit: Payer: BLUE CROSS/BLUE SHIELD | Admitting: Podiatry

## 2018-04-04 DIAGNOSIS — L97521 Non-pressure chronic ulcer of other part of left foot limited to breakdown of skin: Secondary | ICD-10-CM

## 2018-04-04 DIAGNOSIS — I10 Essential (primary) hypertension: Secondary | ICD-10-CM | POA: Diagnosis not present

## 2018-04-04 DIAGNOSIS — L97529 Non-pressure chronic ulcer of other part of left foot with unspecified severity: Secondary | ICD-10-CM | POA: Diagnosis not present

## 2018-04-04 DIAGNOSIS — M86272 Subacute osteomyelitis, left ankle and foot: Secondary | ICD-10-CM

## 2018-04-04 DIAGNOSIS — E11621 Type 2 diabetes mellitus with foot ulcer: Secondary | ICD-10-CM | POA: Diagnosis not present

## 2018-04-04 DIAGNOSIS — D649 Anemia, unspecified: Secondary | ICD-10-CM | POA: Diagnosis not present

## 2018-04-04 DIAGNOSIS — Z452 Encounter for adjustment and management of vascular access device: Secondary | ICD-10-CM | POA: Diagnosis not present

## 2018-04-04 DIAGNOSIS — M86171 Other acute osteomyelitis, right ankle and foot: Secondary | ICD-10-CM | POA: Diagnosis not present

## 2018-04-04 DIAGNOSIS — M86172 Other acute osteomyelitis, left ankle and foot: Secondary | ICD-10-CM | POA: Diagnosis not present

## 2018-04-05 ENCOUNTER — Telehealth: Payer: Self-pay | Admitting: *Deleted

## 2018-04-05 NOTE — Telephone Encounter (Signed)
I am calling you in regards to your insurance.  It is showing that your insurance terminated on April 04, 2018.  "Oh no, I have to call about that.  I usually pay it at the beginning of the month.  I'll have to call them and see what is going on.  I need to do that now."  Yes call now, because we have you scheduled for surgery on November 6.  "I will call and see what's going on.  Thanks for letting me know."

## 2018-04-08 ENCOUNTER — Telehealth: Payer: Self-pay | Admitting: Podiatry

## 2018-04-08 DIAGNOSIS — L97529 Non-pressure chronic ulcer of other part of left foot with unspecified severity: Secondary | ICD-10-CM | POA: Diagnosis not present

## 2018-04-08 DIAGNOSIS — D649 Anemia, unspecified: Secondary | ICD-10-CM | POA: Diagnosis not present

## 2018-04-08 DIAGNOSIS — Z452 Encounter for adjustment and management of vascular access device: Secondary | ICD-10-CM | POA: Diagnosis not present

## 2018-04-08 DIAGNOSIS — I10 Essential (primary) hypertension: Secondary | ICD-10-CM | POA: Diagnosis not present

## 2018-04-08 DIAGNOSIS — E11621 Type 2 diabetes mellitus with foot ulcer: Secondary | ICD-10-CM | POA: Diagnosis not present

## 2018-04-08 DIAGNOSIS — M86172 Other acute osteomyelitis, left ankle and foot: Secondary | ICD-10-CM | POA: Diagnosis not present

## 2018-04-08 NOTE — Telephone Encounter (Signed)
This is [name], Therapist, sports with [facility] in [place]. I saw the pt this morning for both wound care and PICC line care. She informed she is having surgery on her foot wound this Wednesday. I am calling to see if you guys can help [place] out with this PICC line when she is there in surgery. Its almost as if she has a clot at the tip of the PICC inside her vein. If you could call me back at [phone] and you can leave me a message if I don't answer.

## 2018-04-08 NOTE — Progress Notes (Signed)
Subjective: 62 year old female presents the office today for follow-up evaluation of a wound to left foot.  She thinks the wound is doing better.  They have been changing the bandage daily.  She says the swelling is much improved and she has not had any significant redness or any red streaks and overall she feels well she is continue to check her temperature daily.  She has an upcoming appointment with wound care today.  She was discharged home from the hospital with antibiotics for 4 weeks including vancomycin, ceftriaxone, Flagyl.  [facility] is managing the PICC line. She has an appointment with ID on 04/16/2018.  Denies any systemic complaints such as fevers, chills, nausea, vomiting. No acute changes since last appointment, and no other complaints at this time.   Objective: AAO x3, NAD DP/PT pulses palpable bilaterally, CRT less than 3 seconds Ulceration along the lateral aspect of the foot measures 2.3 x 1.7 cm with more granulation tissue present today.  Overall the wound is doing better.  There is no surrounding erythema, ascending sialitis but there is no fluctuation or crepitation or any malodor.  No ascending sialitis.  However along the central aspect of the wound it does probe and undermined on the wound but not able to probe to bone.  Unfortunate the bone is still very prominent at this area. No open lesions or pre-ulcerative lesions.  No pain with calf compression, swelling, warmth, erythema          Assessment: Ulceration left lateral foot with osteomyelitis; improvement   Plan: -All treatment options discussed with the patient including all alternatives, risks, complications.  -Given the continuation of wound is been some slow progress we discussed multiple treatment options.  She wants to try to save the toe.  Given the prominence I discussed with her trying to excise some of the prominent bone the fifth metatarsal base without removing the entire metatarsal  base.  We discussed the risks and complications of surgery as well as continue local wound care versus going to resect the entire metatarsal.  She wants to try to save it if possible.  Discussed with her essentially exostectomy of the fifth metatarsal base.  This will hopefully limit the pressure and also biopsied the bone.  I think this is an optimal time to do it while she is on antibiotics actually to avoid any further infection.  She already had a bone biopsy performed and this is more to limit the pressure to the area but she understands that this is not a guarantee and she may end up with stable and amputation or further surgery to help with the infection and wound healing. -We will plan on essentially exostectomy left foot next week.  I will see her back before the surgery for evaluation.  We discussed the surgery as well as postoperative course and we discussed alternatives, risks, complications including we talked about below. -The incision placement as well as the postoperative course was discussed with the patient. I discussed risks of the surgery which include, but not limited to, infection, bleeding, pain, swelling, need for further surgery, delayed or nonhealing, painful or ugly scar, numbness or sensation changes, over/under correction, recurrence, transfer lesions, further deformity, hardware failure, DVT/PE, loss of toe/foot. Patient understands these risks and wishes to proceed with surgery. The surgical consent was reviewed with the patient all 3 pages were signed. No promises or guarantees were given to the outcome of the procedure. All questions were answered to the best of  my ability. Before the surgery the patient was encouraged to call the office if there is any further questions. The surgery will be performed at the [facility] on an outpatient basis.  Trula Slade DPM

## 2018-04-08 NOTE — Telephone Encounter (Signed)
Can you let anesthesia know. Know sure if they will want to mess with it. Thanks.

## 2018-04-09 ENCOUNTER — Encounter: Payer: Self-pay | Admitting: Podiatry

## 2018-04-09 ENCOUNTER — Ambulatory Visit (INDEPENDENT_AMBULATORY_CARE_PROVIDER_SITE_OTHER): Payer: Self-pay | Admitting: Podiatry

## 2018-04-09 VITALS — Temp 99.1°F

## 2018-04-09 DIAGNOSIS — M86272 Subacute osteomyelitis, left ankle and foot: Secondary | ICD-10-CM

## 2018-04-09 DIAGNOSIS — L97521 Non-pressure chronic ulcer of other part of left foot limited to breakdown of skin: Secondary | ICD-10-CM

## 2018-04-10 ENCOUNTER — Encounter: Payer: Self-pay | Admitting: Podiatry

## 2018-04-10 DIAGNOSIS — G4730 Sleep apnea, unspecified: Secondary | ICD-10-CM | POA: Diagnosis not present

## 2018-04-10 DIAGNOSIS — L97521 Non-pressure chronic ulcer of other part of left foot limited to breakdown of skin: Secondary | ICD-10-CM | POA: Diagnosis not present

## 2018-04-10 DIAGNOSIS — L97424 Non-pressure chronic ulcer of left heel and midfoot with necrosis of bone: Secondary | ICD-10-CM | POA: Diagnosis not present

## 2018-04-10 DIAGNOSIS — M898X7 Other specified disorders of bone, ankle and foot: Secondary | ICD-10-CM | POA: Diagnosis not present

## 2018-04-10 DIAGNOSIS — M85872 Other specified disorders of bone density and structure, left ankle and foot: Secondary | ICD-10-CM | POA: Diagnosis not present

## 2018-04-10 NOTE — Progress Notes (Signed)
   Subjective: 62 year old female presents the office today for follow-up evaluation of a wound to left foot.  She is scheduled for surgery tomorrow to remove the fifth metatarsal base prominence.  She states overall she is doing well she has not had any significant fevers, chills, nausea, vomiting.  She denies any calf pain, chest pain, shortness of breath and she has no other concerns today.  She was discharged home from the hospital with antibiotics for 4 weeks including vancomycin, ceftriaxone, Flagyl.  [facility] is managing the PICC line. She has an appointment with ID on 04/16/2018.  Objective: AAO x3, NAD DP/PT pulses palpable bilaterally, CRT less than 3 seconds Ulceration along the lateral aspect of the foot measures 2.3 x 1.7 cm with mostly granular wound base.  Is felt the same side the last appointment.  There is still a central area which does tunnel around the wound.  There is no drainage or pus identified in the area today there is a faint rim of surrounding erythema but there is no ascending cellulitis.  There is no fluctuation or crepitation or any malodor. No pain with calf compression, swelling, warmth, erythema  Assessment: Ulceration left lateral foot with osteomyelitis; improvement   Plan: -Treatment options discussed including all alternatives, risks, and complications -Overall no gross signs of infection to the wound.  Due to this we will implant as scheduled for surgery tomorrow to try to remove some of the bony prominence and for new biopsy.  I again discussed the surgery as well as postoperative course.  She has no questions or concerns today she wished to proceed.  Again discussed risks of surgery and she states that her son was also talking about a BKA but she is not wanting to do this and I am in agreement as there is localized infection does not appear to be spreading and I think we can just try to save her leg.  Trula Slade DPM

## 2018-04-11 DIAGNOSIS — L97529 Non-pressure chronic ulcer of other part of left foot with unspecified severity: Secondary | ICD-10-CM | POA: Diagnosis not present

## 2018-04-11 DIAGNOSIS — M86172 Other acute osteomyelitis, left ankle and foot: Secondary | ICD-10-CM | POA: Diagnosis not present

## 2018-04-11 DIAGNOSIS — Z452 Encounter for adjustment and management of vascular access device: Secondary | ICD-10-CM | POA: Diagnosis not present

## 2018-04-11 DIAGNOSIS — I10 Essential (primary) hypertension: Secondary | ICD-10-CM | POA: Diagnosis not present

## 2018-04-11 DIAGNOSIS — D649 Anemia, unspecified: Secondary | ICD-10-CM | POA: Diagnosis not present

## 2018-04-11 DIAGNOSIS — E11621 Type 2 diabetes mellitus with foot ulcer: Secondary | ICD-10-CM | POA: Diagnosis not present

## 2018-04-12 ENCOUNTER — Telehealth: Payer: Self-pay | Admitting: *Deleted

## 2018-04-12 ENCOUNTER — Ambulatory Visit (INDEPENDENT_AMBULATORY_CARE_PROVIDER_SITE_OTHER): Payer: Medicare Other | Admitting: Podiatry

## 2018-04-12 ENCOUNTER — Ambulatory Visit (INDEPENDENT_AMBULATORY_CARE_PROVIDER_SITE_OTHER): Payer: Medicare Other

## 2018-04-12 ENCOUNTER — Encounter: Payer: Medicare Other | Admitting: Podiatry

## 2018-04-12 ENCOUNTER — Encounter: Payer: Self-pay | Admitting: Podiatry

## 2018-04-12 VITALS — Temp 100.0°F

## 2018-04-12 DIAGNOSIS — L97521 Non-pressure chronic ulcer of other part of left foot limited to breakdown of skin: Secondary | ICD-10-CM | POA: Diagnosis not present

## 2018-04-12 NOTE — Telephone Encounter (Signed)
I informed Ladona Ridgel, Pharmacist - [place] of Dr. Leigh Aurora orders.

## 2018-04-12 NOTE — Telephone Encounter (Signed)
Dr. Jacqualyn Posey ordered Olivia Romero to continue the vancomycin and rocephin for another week and perform their standard labs for those medications on Mondays.

## 2018-04-15 ENCOUNTER — Encounter: Payer: Self-pay | Admitting: Podiatry

## 2018-04-15 ENCOUNTER — Ambulatory Visit (INDEPENDENT_AMBULATORY_CARE_PROVIDER_SITE_OTHER): Payer: Medicare Other | Admitting: Podiatry

## 2018-04-15 VITALS — Temp 99.7°F

## 2018-04-15 DIAGNOSIS — L97529 Non-pressure chronic ulcer of other part of left foot with unspecified severity: Secondary | ICD-10-CM | POA: Diagnosis not present

## 2018-04-15 DIAGNOSIS — E11621 Type 2 diabetes mellitus with foot ulcer: Secondary | ICD-10-CM | POA: Diagnosis not present

## 2018-04-15 DIAGNOSIS — L97521 Non-pressure chronic ulcer of other part of left foot limited to breakdown of skin: Secondary | ICD-10-CM | POA: Diagnosis not present

## 2018-04-15 DIAGNOSIS — I10 Essential (primary) hypertension: Secondary | ICD-10-CM | POA: Diagnosis not present

## 2018-04-15 DIAGNOSIS — Z452 Encounter for adjustment and management of vascular access device: Secondary | ICD-10-CM | POA: Diagnosis not present

## 2018-04-15 DIAGNOSIS — D649 Anemia, unspecified: Secondary | ICD-10-CM | POA: Diagnosis not present

## 2018-04-15 DIAGNOSIS — M86172 Other acute osteomyelitis, left ankle and foot: Secondary | ICD-10-CM | POA: Diagnosis not present

## 2018-04-16 ENCOUNTER — Telehealth: Payer: Self-pay | Admitting: Behavioral Health

## 2018-04-16 ENCOUNTER — Encounter: Payer: Self-pay | Admitting: Infectious Diseases

## 2018-04-16 ENCOUNTER — Ambulatory Visit (INDEPENDENT_AMBULATORY_CARE_PROVIDER_SITE_OTHER): Payer: Medicare Other | Admitting: Infectious Diseases

## 2018-04-16 VITALS — BP 138/72 | HR 78 | Temp 99.9°F | Ht 60.0 in | Wt 189.0 lb

## 2018-04-16 DIAGNOSIS — E1142 Type 2 diabetes mellitus with diabetic polyneuropathy: Secondary | ICD-10-CM

## 2018-04-16 DIAGNOSIS — L97311 Non-pressure chronic ulcer of right ankle limited to breakdown of skin: Secondary | ICD-10-CM

## 2018-04-16 NOTE — Progress Notes (Addendum)
Patient: Olivia Romero  DOB: Dec 05, 1955 MRN: [id number] PCP: Arnetha Courser, MD  Referring Provider: [facility]   Patient Active Problem List   Diagnosis Date Noted  . BMI 36.0-36.9,adult   . Myalgia due to statin 11/19/2017  . Morbid obesity ([place]) 11/19/2017  . Uncontrolled type 2 diabetes mellitus with hyperglycemia ([place]) 11/01/2017  . B12 deficiency 11/01/2017  . Skin ulcer of right ankle, limited to breakdown of skin ([place]) 12/31/2016  . Diabetic foot ulcer ([place]) 12/14/2016  . Diabetic polyneuropathy associated with type 2 diabetes mellitus ([place]) 12/13/2015  . Lumbar stenosis with neurogenic claudication 09/02/2015  . 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 Chronic constipation 07/13/2015  . OSA (obstructive sleep apnea) 06/25/2015  . 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/23/2015  . Hypertriglyceridemia 04/23/2015  . Statin intolerance 04/23/2015  . Asthma, mild intermittent 02/01/2015  . Type 2 diabetes mellitus with diabetic nephropathy ([place]) 12/31/2014  . Esophagitis 12/31/2014  . Dysphagia 12/31/2014  . Bilateral carpal tunnel syndrome 12/01/2014  . Benign neoplasm of colon 11/30/2012  . Diverticulosis of colon (without mention of hemorrhage) 11/30/2012  . Essential hypertension, benign 11/28/2012     Subjective:  Olivia Romero is a 62 y.o. woman here for hospital follow up on her neuropathic ulcer involving the left lateral foot. She has no complaints today. Accompanied by a close friend.   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 Intraoperative cultures of the bone did not have anything on culture but did reveal few GNRs and rare GVRs on stain. Pathology was negative for osteomyelitis. She was discharged home on IV vancomycin, ceftriaxone and oral metronidazole for more empiric coverage for DFU in a diabetic patient with long-standing wound. She has done well with her PICC  line and is now on day 32 of empiric therapy. Her ulcer has improved with treatment and wound care. She has worked closely with Dr. Jacqualyn Posey and went back for more surgery 11/06 to remove more bone to help with reduction of pressure to allow for better healing; bone biopsy was done at that time but she has not been notified of the result yet.   She has not missed any doses of antibiotics. PICC line is without pain, drainage or erythema and has been well maintained by [facility] Team. Has had some occasional temperatures 99.2 - 99.7 F though the weeks she has received treatment but nothing like what she had before hospitalization. Denies any malodor or pain (she is insensate at baseline). She has done much better with regards to recommendation to offload and use wheelchair at home. Diabetes has been reportedly under good control. Incisions are healing nicely.   Review of Systems  Constitutional: Negative for chills, fever, malaise/fatigue and weight loss.  HENT: Negative for sore throat.   Respiratory: Negative for cough, sputum production and shortness of breath.   Cardiovascular: Positive for leg swelling (minimal, improved ). Negative for chest pain and orthopnea.  Gastrointestinal: Negative for abdominal pain, diarrhea and vomiting.  Musculoskeletal: Negative for joint pain, myalgias and neck pain.  Skin: Negative for rash.  Neurological: Negative for headaches.  Psychiatric/Behavioral: Negative for depression and substance abuse. The patient is not nervous/anxious.     Past Medical History:  Diagnosis Date  . Anemia   . Arthritis   . Asthma   . Carpal tunnel syndrome on right   . Colon polyps    adenomatous  . Diabetes mellitus without complication ([place])   .  Diverticulosis of colon   . Esophagitis   . GERD (gastroesophageal reflux disease)   . Hemorrhoid    internal  . Hyperlipemia   . Hypertension   . Neuropathy   . Pneumonia   . PONV (postoperative nausea and vomiting)   .  Sleep apnea    has had in the past lost 50 pounds and do longer uses cpap  . Stroke-like episode ([place]) 2009   TIA    Outpatient Medications Prior to Visit  Medication Sig Dispense Refill  . acetaminophen (TYLENOL) 500 MG tablet Take 1,000 mg by mouth every 6 (six) hours as needed for mild pain.     [name] albuterol (PROVENTIL HFA;VENTOLIN HFA) 108 (90 Base) MCG/ACT inhaler Inhale 2 puffs into the lungs every 6 (six) hours as needed for wheezing or shortness of breath. 1 Inhaler 3  . aspirin EC 325 MG tablet Take 1 tablet (325 mg total) by mouth daily. 30 tablet 0  . Blood Glucose Monitoring Suppl (CONTOUR NEXT MONITOR) w/Device KIT as directed.     . cefTRIAXone (ROCEPHIN) IVPB Inject 2 g into the vein daily.    . DULoxetine (CYMBALTA) 60 MG capsule Take 60 mg by mouth at bedtime.    . fenofibrate (TRICOR) 145 MG tablet Take 145 mg by mouth daily.  10  . gabapentin (NEURONTIN) 400 MG capsule Take 800 mg by mouth 3 (three) times daily.     [name] glimepiride (AMARYL) 2 MG tablet Take 2 mg by mouth 2 (two) times daily.     [name] glucose blood (CONTOUR NEXT TEST) test strip as directed.     . hydrochlorothiazide (HYDRODIURIL) 12.5 MG tablet Take 12.5 mg by mouth daily.     . Insulin Degludec (TRESIBA FLEXTOUCH) 200 UNIT/ML SOPN Inject 20 Units into the skin at bedtime.     . INVOKAMET XR [phone] MG TB24 Take 2 tablets by mouth at bedtime.   10  . Multiple Vitamin (MULTIVITAMIN WITH MINERALS) TABS Take 1 tablet by mouth daily. Reported on 07/21/2015    . torsemide (DEMADEX) 20 MG tablet Take 1 tablet (20 mg total) by mouth every other day. 15 tablet 2  . vancomycin IVPB Inject 1,000 mg into the vein every 12 (twelve) hours.    . insulin degludec (TRESIBA FLEXTOUCH) 100 UNIT/ML SOPN FlexTouch Pen Inject 20 Units into the skin at bedtime.    . pantoprazole (PROTONIX) 40 MG tablet Take 1 tablet (40 mg total) by mouth daily. (Patient not taking: Reported on 04/16/2018) 30 tablet 1  . silver sulfADIAZINE  (SILVADENE) 1 % cream Apply 1 application topically daily. 50 g 0   Facility-Administered Medications Prior to Visit  Medication Dose Route Frequency Provider Last Rate Last Dose  . albuterol (PROVENTIL) (2.5 MG/3ML) 0.083% nebulizer solution 2.5 mg  2.5 mg Nebulization Once Poulose, Bethel Born, NP         Allergies  Allergen Reactions  . Eggs Or Egg-Derived Products Swelling and Other (See Comments)    Angioedema  . Atorvastatin Other (See Comments)    Liver toxicity  . Flu Virus Vaccine Swelling    Arm swelled (site of injection)  . Latex Itching  . Pravastatin Itching and Rash  . Tape Rash and Other (See Comments)    TAPE PULLS OFF THE SKIN!!! Please use an alternative!!    Social History   Tobacco Use  . Smoking status: Never Smoker  . Smokeless tobacco: Never Used  Substance Use Topics  . Alcohol use: Never  Frequency: Never  . Drug use: No    Family History  Problem Relation Age of Onset  . Lung cancer Father   . Hypertension Father   . Arthritis Father   . Other Mother        hardening of the arteries/renal cell carcenoma  . Hypertension Mother   . Stroke Mother   . Kidney cancer Mother   . Arthritis Brother   . Rheum arthritis Maternal Uncle   . Bladder Cancer Neg Hx     Objective:   Vitals:   04/16/18 1437  BP: 138/72  Pulse: 78  Temp: 99.9 F (37.7 C)  TempSrc: Oral  Weight: 189 lb (85.7 kg)  Height: 5' (1.524 m)   Body mass index is 36.91 kg/m.  Physical Exam  Constitutional: She is oriented to person, place, and time. She appears well-developed and well-nourished.  Seated comfortably in w/chair today accompanied by a close friend. Appears well.   HENT:  Mouth/Throat: Mucous membranes are normal. No oral lesions. Normal dentition. No dental abscesses. No oropharyngeal exudate.  Cardiovascular: Normal rate, regular rhythm and normal heart sounds.  Pulmonary/Chest: Effort normal and breath sounds normal.  Abdominal: Soft. She exhibits  no distension. There is no tenderness.  Musculoskeletal:  Left foot as pictured. The lateral/plantar ulceration appears freshly debrided and w/o drainage. No purulence in wound bed. Peri-wound is w/o erythema, warmth or induration. Incision is approximated with sutures in place.   Neurological: She is alert and oriented to person, place, and time.  Skin: Skin is warm and dry. No rash noted.  Psychiatric: She has a normal mood and affect. Judgment normal.  In good spirits today and engaged in care discussion  Nursing note and vitals reviewed. RUE PICC line - clean/dry/intact dressing with normal appearing insertion site.      Lab Results: Lab Results  Component Value Date   WBC 6.7 03/13/2018   HGB 11.2 (L) 03/13/2018   HCT 37.5 03/13/2018   MCV 79.8 (L) 03/13/2018   PLT 315 03/13/2018    Lab Results  Component Value Date   CREATININE 0.71 03/16/2018   BUN 20 03/16/2018   NA 133 (L) 03/16/2018   K 3.7 03/16/2018   CL 97 (L) 03/16/2018   CO2 27 03/16/2018    Lab Results  Component Value Date   ALT 17 03/11/2018   AST 17 03/11/2018   ALKPHOS 73 03/11/2018   BILITOT 0.4 03/11/2018     Assessment & Plan:   Problem List Items Addressed This Visit      Unprioritized   Diabetic polyneuropathy associated with type 2 diabetes mellitus ([place])    Encouraged good off-loading from foot, elevation, tedious wound care and blood sugar management to help site heal up.       Skin ulcer of right ankle, limited to breakdown of skin ([place]) - Primary    Ulcer is much smaller and looks better. No drainage or foul odor and swelling is improved significantly since my last visit with her a month ago. No evidence of deep bone infection and will plan on stopping her IV/PO antibiotics Saturday after she completes her last dose. She will need to continue wound care and regular follow up with Dr. Jacqualyn Posey. I think a lot of her MRI findings are more c/w arthropathy and tendinopathy damage rather than  deep infection.  Available as needed in the future if needed.   ADDENDUM 04/18/2018 9:04 AM bone culture with light growth of stenotrophomonas maltophilia - requested sensitivity  reports to be faxed to our office but per Dr. Leigh Aurora report S-bactrim and levaquin. EIther option would offer excellent bioavailability - treatment of choice would be the trimethoprim-sulfamethoxazole.         Return if symptoms worsen or fail to improve.  Will call her to have PICC line pulled Friday and have her start 14-21d course of levaquin 750 mg QD. Alternatively could treat with bactrim but the dose is quite high (3 DS tabs TID) and I worry about her kidneys tolerating it for 2-3 weeks. Called her and discussed over the phone and precautions provided about not taking with milk/dairy or other minerals/vitamins and s/e to report. She has tolerated levaquin well in the past and has no questions. She will stop all IV antibiotics and metronidazole by mouth.   Olivia Madeira, MSN, NP-C [facility] [place] Pager: [phone] Office: [phone]  04/19/18  11:01 AM

## 2018-04-16 NOTE — Patient Instructions (Signed)
Your foot looks much better.   Please continue your antibiotics through Saturday and then stop (IV and by mouth). Will have [facility] team pull PICC out after Saturday's dose. Glad your bone biopsy again came back without evidence of osteomyelitis.   Continue to monitor your temperatures and keep a log.   Would stay off your foot as much as you can to allow this to heal up completely. Continue wound care per Dr. Leigh Aurora direction. Good blood sugar control as you are doing is also very important.   We will see you back as needed if you show signs of concern for infection.

## 2018-04-16 NOTE — Assessment & Plan Note (Signed)
Encouraged good off-loading from foot, elevation, tedious wound care and blood sugar management to help site heal up.

## 2018-04-16 NOTE — Assessment & Plan Note (Addendum)
Ulcer is much smaller and looks better. No drainage or foul odor and swelling is improved significantly since my last visit with her a month ago. No evidence of deep bone infection and will plan on stopping her IV/PO antibiotics Saturday after she completes her last dose. She will need to continue wound care and regular follow up with Dr. Jacqualyn Posey. I think a lot of her MRI findings are more c/w arthropathy and tendinopathy damage rather than deep infection.  Available as needed in the future if needed.   ADDENDUM 04/18/2018 9:04 AM bone culture with light growth of stenotrophomonas maltophilia - requested sensitivity reports to be faxed to our office but per Dr. Leigh Aurora report S-bactrim and levaquin. EIther option would offer excellent bioavailability - treatment of choice would be the trimethoprim-sulfamethoxazole.

## 2018-04-16 NOTE — Telephone Encounter (Signed)
Britton spoke to [place].  Gave verbal order per Janene Madeira to pull PICC at the end of IV therapy on Saturday 04/20/2018.  Coretta verbalized order with read back. Pricilla Riffle RN

## 2018-04-17 NOTE — Progress Notes (Signed)
Subjective: Olivia Romero is a 62 y.o. is seen today in office s/p left foot fifth metatarsal ostectomy preformed on 04/10/2018.  She presents today for dressing change improvement evaluation.  States that she is been doing well.  She has an appointment with infectious disease tomorrow.  She was able to get the antibiotics and she is to continue them without any interruption.  Her pain is controlled.  Again she denies any fevers and she is been checking her temperature daily.  Denies any systemic complaints such as fevers, chills, nausea, vomiting. No calf pain, chest pain, shortness of breath.   Objective: General: No acute distress, AAOx3  DP/PT pulses palpable 2/4, CRT < 3 sec to all digits.  LEFT foot: Incision is well coapted without any evidence of dehiscence and sutures are intact.  Swelling has decreased since I saw her on Friday.  The wound is a more fibrotic color to it as well as some mild hyperkeratotic periwound.  There is no drainage or pus identified.  There is no ascending cellulitis there is no fluctuation or crepitation there is no significant cellulitis identified today.  After debridement the wound measures 2.3 x 1.2 cm. No other open lesions or pre-ulcerative lesions.  No pain with calf compression, swelling, warmth, erythema.          Assessment and Plan:  Status post left foot ostectomy of the fifth metatarsal and wound debridement, doing well with no complications   -Treatment options discussed including all alternatives, risks, and complications -Today I sharply debrided the wound utilizing #312 with scalpel in the healthy, bleeding, granular tissue.  Recommend to switch to medihoney dressing changes daily.  This is been brought to the wound and Betadine on the incision.  Continue daily dressing changes. -Awaiting biopsy results. -Continue nonweightbearing. -She has infectious disease appointment tomorrow. -Monitor for any clinical signs or symptoms of infection  and DVT/PE and directed to call the office immediately should any occur or go to the ER. -Follow-up in 1 week or sooner if any problems arise. In the meantime, encouraged to call the office with any questions, concerns, change in symptoms.   Celesta Gentile, DPM

## 2018-04-17 NOTE — Progress Notes (Signed)
Subjective: TRISTIAN BOUSKA is a 62 y.o. is seen today in office s/p left foot fifth metatarsal ostectomy preformed on 04/10/2018.  She states that overall she is doing well her pain is controlled.  She has not had any fevers or chills.  She still getting her antibiotics through PICC line.  She is post to run out tomorrow.  Otherwise she is been doing well she is interested in nonweightbearing as much as possible.  Denies any systemic complaints such as fevers, chills, nausea, vomiting. No calf pain, chest pain, shortness of breath.   Objective: General: No acute distress, AAOx3  DP/PT pulses palpable 2/4, CRT < 3 sec to all digits.  LEFT foot: Incision is well coapted without any evidence of dehiscence and sutures are intact.  The incision standpoint there is no surrounding erythema there is no drainage or pus and appears to be doing well.  The wound has a granular base.  There is no probing, undermining or tunneling.  No significant swelling erythema there is no ascending cellulitis.  There is no fluctuation or crepitation or any malodor.  The wound measures probably 2.5 x 1.5 cm. No other open lesions or pre-ulcerative lesions.  No pain with calf compression, swelling, warmth, erythema.        Assessment and Plan:  Status post left foot ostectomy of the fifth metatarsal and wound debridement, doing well with no complications   -Treatment options discussed including all alternatives, risks, and complications -X-rays were obtained reviewed.  Difficult to see the fifth metatarsal given the cavus deformity of her foot.  Status post osteotomy of the lateral fifth metatarsal base. -Patient has pain over the incision as well as the wound.  A dry sterile dressing was applied.  Keep the dressing clean, dry, intact. -I do want to continue antibiotics for another week and we will reorder them today through [facility]. -Continue nonweightbearing -Pain medication as needed. -Monitor for any  clinical signs or symptoms of infection and DVT/PE and directed to call the office immediately should any occur or go to the ER. -Follow-up on Monday or sooner if any problems arise. In the meantime, encouraged to call the office with any questions, concerns, change in symptoms.   Celesta Gentile, DPM

## 2018-04-18 DIAGNOSIS — Z452 Encounter for adjustment and management of vascular access device: Secondary | ICD-10-CM | POA: Diagnosis not present

## 2018-04-18 DIAGNOSIS — Z5181 Encounter for therapeutic drug level monitoring: Secondary | ICD-10-CM | POA: Diagnosis not present

## 2018-04-18 DIAGNOSIS — M86172 Other acute osteomyelitis, left ankle and foot: Secondary | ICD-10-CM | POA: Diagnosis not present

## 2018-04-18 DIAGNOSIS — E538 Deficiency of other specified B group vitamins: Secondary | ICD-10-CM | POA: Diagnosis not present

## 2018-04-18 DIAGNOSIS — L97529 Non-pressure chronic ulcer of other part of left foot with unspecified severity: Secondary | ICD-10-CM | POA: Diagnosis not present

## 2018-04-18 DIAGNOSIS — E1142 Type 2 diabetes mellitus with diabetic polyneuropathy: Secondary | ICD-10-CM | POA: Diagnosis not present

## 2018-04-18 DIAGNOSIS — G4733 Obstructive sleep apnea (adult) (pediatric): Secondary | ICD-10-CM | POA: Diagnosis not present

## 2018-04-18 DIAGNOSIS — I10 Essential (primary) hypertension: Secondary | ICD-10-CM | POA: Diagnosis not present

## 2018-04-18 DIAGNOSIS — D649 Anemia, unspecified: Secondary | ICD-10-CM | POA: Diagnosis not present

## 2018-04-18 DIAGNOSIS — E11621 Type 2 diabetes mellitus with foot ulcer: Secondary | ICD-10-CM | POA: Diagnosis not present

## 2018-04-18 DIAGNOSIS — Z9989 Dependence on other enabling machines and devices: Secondary | ICD-10-CM | POA: Diagnosis not present

## 2018-04-19 MED ORDER — LEVOFLOXACIN 750 MG PO TABS: 750.0000 mg | ORAL_TABLET | Freq: Every day | ORAL | 0 refills | Status: AC

## 2018-04-19 NOTE — Addendum Note (Signed)
Addended by: Wellman Callas on: 04/19/2018 11:02 AM   Modules accepted: Orders

## 2018-04-19 NOTE — Progress Notes (Signed)
Thank you :)

## 2018-04-22 ENCOUNTER — Encounter: Payer: Self-pay | Admitting: Podiatry

## 2018-04-22 ENCOUNTER — Ambulatory Visit (INDEPENDENT_AMBULATORY_CARE_PROVIDER_SITE_OTHER): Payer: Medicare Other | Admitting: Podiatry

## 2018-04-22 VITALS — BP 132/71 | HR 97 | Temp 99.6°F | Resp 18

## 2018-04-22 DIAGNOSIS — L97529 Non-pressure chronic ulcer of other part of left foot with unspecified severity: Secondary | ICD-10-CM | POA: Diagnosis not present

## 2018-04-22 DIAGNOSIS — D649 Anemia, unspecified: Secondary | ICD-10-CM | POA: Diagnosis not present

## 2018-04-22 DIAGNOSIS — Z452 Encounter for adjustment and management of vascular access device: Secondary | ICD-10-CM | POA: Diagnosis not present

## 2018-04-22 DIAGNOSIS — M86172 Other acute osteomyelitis, left ankle and foot: Secondary | ICD-10-CM | POA: Diagnosis not present

## 2018-04-22 DIAGNOSIS — I10 Essential (primary) hypertension: Secondary | ICD-10-CM | POA: Diagnosis not present

## 2018-04-22 DIAGNOSIS — E11621 Type 2 diabetes mellitus with foot ulcer: Secondary | ICD-10-CM | POA: Diagnosis not present

## 2018-04-22 DIAGNOSIS — M86272 Subacute osteomyelitis, left ankle and foot: Secondary | ICD-10-CM

## 2018-04-22 DIAGNOSIS — L97521 Non-pressure chronic ulcer of other part of left foot limited to breakdown of skin: Secondary | ICD-10-CM

## 2018-04-25 ENCOUNTER — Encounter: Payer: Medicare Other | Admitting: Podiatry

## 2018-04-28 NOTE — Progress Notes (Signed)
Subjective: Olivia Romero is a 62 y.o. is seen today in office s/p left foot fifth metatarsal ostectomy preformed on 04/10/2018.  She presents today for follow-up evaluation of the wound.  She states overall she is doing well.  They have been doing daily dressing changes.  She denies any drainage or pus comfort area no increase in swelling or any redness or any red streaks.  She is having no pain.  She is try to stop her foot as much as possible.  She denies any fevers, chills, nausea, vomiting.  She denies any calf pain, chest pain, shortness of breath.  Her PICC line has been discontinued but she started Levaquin on Saturday.  This was ordered by infectious disease.  Objective: General: No acute distress, AAOx3  DP/PT pulses palpable 2/4, CRT < 3 sec to all digits.  LEFT foot: Incision is well coapted without any evidence of dehiscence and sutures are intact.  Mild swelling to the foot but there is no erythema or increase in warmth.  There is no increase in warmth of the foot there is no erythema.  Wound on the lateral aspect left foot on the fifth metatarsal base remains.  Overall things have the last appointment but is starting to scab over.  There is no fluctuation or crepitation.  There is no malodor. No pain with calf compression, swelling, warmth, erythema.      Assessment and Plan:  Status post left foot ostectomy of the fifth metatarsal and wound debridement, doing well with no complications   -Treatment options discussed including all alternatives, risks, and complications -I removed the sutures today without any complications or bleeding.  I also debrided the wound utilizing a 312 with scalpel down to healthy, bleeding, granular tissue.  We will continue with daily dressing changes. -Continue cam boot and offloading all times. -Continue antibiotics as directed by infectious disease. -Monitor for any clinical signs or symptoms of infection and DVT/PE and directed to call the office  immediately should any occur or go to the ER. -Follow-up in 1 week or sooner if any problems arise. In the meantime, encouraged to call the office with any questions, concerns, change in symptoms.   Celesta Gentile, DPM

## 2018-04-30 ENCOUNTER — Telehealth: Payer: Self-pay | Admitting: *Deleted

## 2018-04-30 ENCOUNTER — Encounter: Payer: Self-pay | Admitting: Podiatry

## 2018-04-30 ENCOUNTER — Ambulatory Visit (INDEPENDENT_AMBULATORY_CARE_PROVIDER_SITE_OTHER): Payer: Medicare Other | Admitting: Podiatry

## 2018-04-30 VITALS — BP 130/83 | HR 75 | Resp 16

## 2018-04-30 DIAGNOSIS — L97522 Non-pressure chronic ulcer of other part of left foot with fat layer exposed: Secondary | ICD-10-CM | POA: Diagnosis not present

## 2018-04-30 NOTE — Progress Notes (Signed)
Subjective: Olivia Romero is a 62 y.o. is seen today in office s/p left foot fifth metatarsal ostectomy preformed on 04/10/2018.  She has continued with medihoney dressing changes daily.  She states that the wound is doing much better.  She denies any drainage or pus she denies any redness of the wound.  Overall she feels well she denies any fevers, chills, nausea, vomiting.  She denies any calf pain, chest pain, shortness of breath.  She is been trying to remain nonweightbearing as much as possible due to carpal tunnel issues on the right side she is been not able to use her walker as much.  Her PICC line has been discontinued but she started Levaquin.  This was ordered by infectious disease.  Objective: General: No acute distress, AAOx3  DP/PT pulses palpable 2/4, CRT < 3 sec to all digits.  LEFT foot: Incision is well coapted without any evidence of dehiscence and a scar has formed.  Ulceration continues lateral fifth metatarsal base area measuring 1.2 x 0.8 x 0.1 cm.  There is no probing, undermining or tunneling.  There is some faint pink tissue in the area but this is more from the skin is peeled off.  There is no erythema or warmth otherwise.  There is no fluctuation or crepitation or any malodor.  No tenderness to the evaluation. No pain with calf compression, swelling, warmth, erythema.   Assessment and Plan:  Status post left foot ostectomy of the fifth metatarsal and wound debridement, doing well with no complications   -Treatment options discussed including all alternatives, risks, and complications -I sharply debrided the wound utilizing a 312 with scalpel down to healthy, bleeding, granular tissue.  We will continue with daily dressing changes for now use Medhihoney dressing and I ordered a collagen/silver dressing today through [facility].  We discussed how to apply the ointment and a dressing. -Continue cam boot and offloading all times. -Continue antibiotics as directed by infectious  disease. -Monitor for any clinical signs or symptoms of infection and DVT/PE and directed to call the office immediately should any occur or go to the ER. -Follow-up in 1 week or sooner if any problems arise. In the meantime, encouraged to call the office with any questions, concerns, change in symptoms.   Celesta Gentile, DPM

## 2018-04-30 NOTE — Telephone Encounter (Signed)
Dr. Jacqualyn Posey ordered Collagen Ag, antimicrobial roll gauze, and 4 x 4 sterile gauze for daily dressing changes to left 5th metatarsal base 1.2 x 0.8 x 0.1cm with low exudate, faxed to [facility].

## 2018-05-01 DIAGNOSIS — L97529 Non-pressure chronic ulcer of other part of left foot with unspecified severity: Secondary | ICD-10-CM | POA: Diagnosis not present

## 2018-05-01 DIAGNOSIS — Z452 Encounter for adjustment and management of vascular access device: Secondary | ICD-10-CM | POA: Diagnosis not present

## 2018-05-01 DIAGNOSIS — I10 Essential (primary) hypertension: Secondary | ICD-10-CM | POA: Diagnosis not present

## 2018-05-01 DIAGNOSIS — E11621 Type 2 diabetes mellitus with foot ulcer: Secondary | ICD-10-CM | POA: Diagnosis not present

## 2018-05-01 DIAGNOSIS — M86172 Other acute osteomyelitis, left ankle and foot: Secondary | ICD-10-CM | POA: Diagnosis not present

## 2018-05-01 DIAGNOSIS — D649 Anemia, unspecified: Secondary | ICD-10-CM | POA: Diagnosis not present

## 2018-05-08 DIAGNOSIS — E1142 Type 2 diabetes mellitus with diabetic polyneuropathy: Secondary | ICD-10-CM | POA: Diagnosis not present

## 2018-05-08 DIAGNOSIS — E538 Deficiency of other specified B group vitamins: Secondary | ICD-10-CM | POA: Diagnosis not present

## 2018-05-08 LAB — HEMOGLOBIN A1C: Hemoglobin A1C: 7

## 2018-05-09 ENCOUNTER — Telehealth: Payer: Self-pay | Admitting: *Deleted

## 2018-05-09 ENCOUNTER — Ambulatory Visit (INDEPENDENT_AMBULATORY_CARE_PROVIDER_SITE_OTHER): Payer: Medicare Other | Admitting: Podiatry

## 2018-05-09 DIAGNOSIS — L97522 Non-pressure chronic ulcer of other part of left foot with fat layer exposed: Secondary | ICD-10-CM | POA: Diagnosis not present

## 2018-05-09 NOTE — Telephone Encounter (Signed)
Faxed orders of 05/09/2018 3:28pm to [facility] for left 5th MPJ ulcer.

## 2018-05-09 NOTE — Telephone Encounter (Signed)
-----   Message from Trula Slade, DPM sent at 05/09/2018  3:28 PM EST ----- Can you please fax home health care orders to [facility]. It is to change the bandage every other day with the collagen/silver dressing that we ordered then cover with gauze. Clean wound with saline.  They are coming out to the house still but she was told she needed updated orders.

## 2018-05-09 NOTE — Progress Notes (Signed)
Subjective: Olivia Romero is a 62 y.o. is seen today in office s/p left foot fifth metatarsal ostectomy preformed on 04/10/2018.  Overall she states that the wound is doing much better.  Denies any drainage of pus or increasing swelling or redness.  She has no new concerns.  She is been try to limit weightbearing as much as possible and she is remain in the cam boot.  She is still on Levaquin as ordered by infectious disease.  Denies any fevers, chills, nausea, vomiting.  No calf pain, chest pain, shortness of breath.  Objective: General: No acute distress, AAOx3  DP/PT pulses palpable 2/4, CRT < 3 sec to all digits.  LEFT foot: Incision is well coapted without any evidence of dehiscence and a scar has formed.  Ulceration continues lateral fifth metatarsal base area measuring 1.6 x 0.6 x 0.1 cm after debridement.  There is no probing, undermining or tunneling.  There is some faint pink tissue in the area but this is more from the skin is peeled off.  There is no erythema or warmth otherwise.  There is no fluctuation or crepitation or any malodor.  No tenderness to the evaluation. No pain with calf compression, swelling, warmth, erythema.   Assessment and Plan:  Status post left foot ostectomy of the fifth metatarsal and wound debridement, doing well with no complications   -Treatment options discussed including all alternatives, risks, and complications -I sharply debrided the wound utilizing a 312 with scalpel down to healthy, bleeding, granular tissue.  We will continue with daily dressing changes with a collagen/silver dressing. -Continue cam boot and offloading all times. -Continue antibiotics as directed by infectious disease. -Monitor for any clinical signs or symptoms of infection and DVT/PE and directed to call the office immediately should any occur or go to the ER. -Follow-up in 1 week or sooner if any problems arise. In the meantime, encouraged to call the office with any  questions, concerns, change in symptoms.   Celesta Gentile, DPM

## 2018-05-10 DIAGNOSIS — I10 Essential (primary) hypertension: Secondary | ICD-10-CM | POA: Diagnosis not present

## 2018-05-10 DIAGNOSIS — D649 Anemia, unspecified: Secondary | ICD-10-CM | POA: Diagnosis not present

## 2018-05-10 DIAGNOSIS — M86172 Other acute osteomyelitis, left ankle and foot: Secondary | ICD-10-CM | POA: Diagnosis not present

## 2018-05-10 DIAGNOSIS — Z452 Encounter for adjustment and management of vascular access device: Secondary | ICD-10-CM | POA: Diagnosis not present

## 2018-05-10 DIAGNOSIS — E11621 Type 2 diabetes mellitus with foot ulcer: Secondary | ICD-10-CM | POA: Diagnosis not present

## 2018-05-10 DIAGNOSIS — L97529 Non-pressure chronic ulcer of other part of left foot with unspecified severity: Secondary | ICD-10-CM | POA: Diagnosis not present

## 2018-05-15 ENCOUNTER — Telehealth: Payer: Self-pay | Admitting: Podiatry

## 2018-05-15 DIAGNOSIS — D649 Anemia, unspecified: Secondary | ICD-10-CM | POA: Diagnosis not present

## 2018-05-15 DIAGNOSIS — M86172 Other acute osteomyelitis, left ankle and foot: Secondary | ICD-10-CM | POA: Diagnosis not present

## 2018-05-15 DIAGNOSIS — E11621 Type 2 diabetes mellitus with foot ulcer: Secondary | ICD-10-CM | POA: Diagnosis not present

## 2018-05-15 DIAGNOSIS — E781 Pure hyperglyceridemia: Secondary | ICD-10-CM | POA: Diagnosis not present

## 2018-05-15 DIAGNOSIS — Z794 Long term (current) use of insulin: Secondary | ICD-10-CM | POA: Diagnosis not present

## 2018-05-15 DIAGNOSIS — Z452 Encounter for adjustment and management of vascular access device: Secondary | ICD-10-CM | POA: Diagnosis not present

## 2018-05-15 DIAGNOSIS — I10 Essential (primary) hypertension: Secondary | ICD-10-CM | POA: Diagnosis not present

## 2018-05-15 DIAGNOSIS — L97529 Non-pressure chronic ulcer of other part of left foot with unspecified severity: Secondary | ICD-10-CM | POA: Diagnosis not present

## 2018-05-15 DIAGNOSIS — E11610 Type 2 diabetes mellitus with diabetic neuropathic arthropathy: Secondary | ICD-10-CM | POA: Diagnosis not present

## 2018-05-15 DIAGNOSIS — E1142 Type 2 diabetes mellitus with diabetic polyneuropathy: Secondary | ICD-10-CM | POA: Diagnosis not present

## 2018-05-15 DIAGNOSIS — E1169 Type 2 diabetes mellitus with other specified complication: Secondary | ICD-10-CM | POA: Diagnosis not present

## 2018-05-15 DIAGNOSIS — E669 Obesity, unspecified: Secondary | ICD-10-CM | POA: Diagnosis not present

## 2018-05-15 DIAGNOSIS — E875 Hyperkalemia: Secondary | ICD-10-CM | POA: Diagnosis not present

## 2018-05-15 NOTE — Telephone Encounter (Signed)
Please have her come in this afternoon or in the morning to see what is going on! If I am here I will see her.

## 2018-05-15 NOTE — Telephone Encounter (Signed)
I spoke with [place] states left foot has blood blister beside the wound and an area that is not blanching on the foot, has an appt Friday.  I okayed the continuation of wound care as recommended.

## 2018-05-15 NOTE — Telephone Encounter (Signed)
I explained to pt [place] had informed me of her left foot developments and Dr. Jacqualyn Posey would like her to be seen tomorrow either tomorrow morning by him or Dr. March Rummage. I transferred to schedulers.

## 2018-05-15 NOTE — Telephone Encounter (Signed)
[facility] called. Pt is up for re-certification and they are aware Dr Jacqualyn Posey wants them to continue seeing the pt for wound care. They need new verbal orders with visits once a week for 8 weeks.  Please give them a call back.

## 2018-05-16 ENCOUNTER — Ambulatory Visit (INDEPENDENT_AMBULATORY_CARE_PROVIDER_SITE_OTHER): Payer: Self-pay | Admitting: Podiatry

## 2018-05-16 VITALS — Temp 99.5°F

## 2018-05-16 DIAGNOSIS — L97522 Non-pressure chronic ulcer of other part of left foot with fat layer exposed: Secondary | ICD-10-CM

## 2018-05-16 NOTE — Progress Notes (Signed)
Subjective: Olivia Romero is a 62 y.o. is seen today in office s/p left foot fifth metatarsal ostectomy preformed on 04/10/2018.  She presents today for an acute appointment.  She was scheduled to see me tomorrow but her home health nurse was concerned because there was a blister on the bottom of her foot and there was also a white spot on the side of her foot.  Last night she did have a temperature of 100 degrees she felt warm but she feels better today.  She currently does not endorse any fevers, chills, nausea, vomiting.  She did not turn limit her weightbearing much as possible.  She has completed her course of antibiotics at this time.  Objective: General: No acute distress, AAOx3  DP/PT pulses palpable 2/4, CRT < 3 sec to all digits.  LEFT foot: Incision is well coapted without any evidence of dehiscence and a scar has formed and the incision is well healed.  The ulceration does continue but today has made good improvement and it measures 1.4 x 0.3 cm and is starting to scab over and the central aspect is still granular.  There is no probing to bone, undermining or tunneling.  There is no fluctuation crepitation any malodor.  There is no purulence.  Mild surrounding edema to the area and some ongoing mild erythema there is no ascending cellulitis.  The area that had a white spot yesterday has resolved today and she does relate that she does not see it anymore. There is a new area of concern on the bottom of her left foot which appears to be an old blister that is hardened.  No edema, erythema, drainage or pus or any signs of infection to this area. No pain with calf compression, swelling, warmth, erythema.   Assessment and Plan:  Status post left foot ostectomy of the fifth metatarsal and wound debridement, doing well with no complications   -Treatment options discussed including all alternatives, risks, and complications -I sharply debrided the wound utilizing a 312 with scalpel down to  healthy, bleeding, granular tissue.  We will continue with daily dressing changes with Prisma every other day.  -Continue cam boot and offloading all times. -She has finished her antibiotics.  However I wonder check her temperature on a daily basis and if it goes up to 100 degrees or above and does not go downward and start Levaquin most likely again.  However we will hold off any further antibiotics at this time.  Her temperature today was 99.5. -Plan continue to have [facility] to the dressing change on Monday and she is going change on Wednesday and I will see her on Fridays. -Monitor for any clinical signs or symptoms of infection and DVT/PE and directed to call the office immediately should any occur or go to the ER. -Follow-up in 1 week or sooner if any problems arise. In the meantime, encouraged to call the office with any questions, concerns, change in symptoms.   Celesta Gentile, DPM

## 2018-05-17 ENCOUNTER — Ambulatory Visit: Payer: Medicare Other | Admitting: Podiatry

## 2018-05-20 DIAGNOSIS — I10 Essential (primary) hypertension: Secondary | ICD-10-CM | POA: Diagnosis not present

## 2018-05-20 DIAGNOSIS — M86172 Other acute osteomyelitis, left ankle and foot: Secondary | ICD-10-CM | POA: Diagnosis not present

## 2018-05-20 DIAGNOSIS — D649 Anemia, unspecified: Secondary | ICD-10-CM | POA: Diagnosis not present

## 2018-05-20 DIAGNOSIS — L97522 Non-pressure chronic ulcer of other part of left foot with fat layer exposed: Secondary | ICD-10-CM | POA: Diagnosis not present

## 2018-05-20 DIAGNOSIS — L97421 Non-pressure chronic ulcer of left heel and midfoot limited to breakdown of skin: Secondary | ICD-10-CM | POA: Diagnosis not present

## 2018-05-20 DIAGNOSIS — E11621 Type 2 diabetes mellitus with foot ulcer: Secondary | ICD-10-CM | POA: Diagnosis not present

## 2018-05-21 ENCOUNTER — Ambulatory Visit: Payer: BLUE CROSS/BLUE SHIELD | Admitting: Family Medicine

## 2018-05-24 ENCOUNTER — Ambulatory Visit (INDEPENDENT_AMBULATORY_CARE_PROVIDER_SITE_OTHER): Payer: Medicare Other | Admitting: Podiatry

## 2018-05-24 ENCOUNTER — Ambulatory Visit (INDEPENDENT_AMBULATORY_CARE_PROVIDER_SITE_OTHER): Payer: Medicare Other

## 2018-05-24 ENCOUNTER — Encounter: Payer: Self-pay | Admitting: Podiatry

## 2018-05-24 DIAGNOSIS — L97521 Non-pressure chronic ulcer of other part of left foot limited to breakdown of skin: Secondary | ICD-10-CM

## 2018-05-24 DIAGNOSIS — M216X2 Other acquired deformities of left foot: Secondary | ICD-10-CM

## 2018-05-24 DIAGNOSIS — Z9889 Other specified postprocedural states: Secondary | ICD-10-CM

## 2018-05-26 NOTE — Progress Notes (Signed)
Subjective: Olivia Romero is a 62 y.o. is seen today in office s/p left foot fifth metatarsal ostectomy preformed on 04/10/2018.  Overall she states that she is doing better.  She has been continue with every other day dressing changes.  Home health changes and on Monday she changes it Wednesday night change and Friday.  She has been continuing with the Prisma dressing changes daily.  Since I last saw her she has not reported any fevers or chills and she has no pain or increase in swelling.  She is been in the cam boot and limit activity.   Objective: General: No acute distress, AAOx3  DP/PT pulses palpable 2/4, CRT < 3 sec to all digits.  LEFT foot: Incision is well coapted without any evidence of dehiscence and a scar has formed and the incision is well healed.  Overall to the lateral aspect foot the wound continues to improve.  Today the wound measures 1 x 0.3 cm and almost appears that starting to scab over it is very superficial.  There is no drainage or pus identified.  There is some chronic edema to the foot but this does not appear to be worsening.  There is no erythema or increase in warmth.  There is no other areas of skin breakdown.  Cavus deformity is present with Charcot changes. No pain with calf compression, swelling, warmth, erythema.   Assessment and Plan:  Status post left foot ostectomy of the fifth metatarsal and wound debridement, doing well with no complications   -Treatment options discussed including all alternatives, risks, and complications -X-rays were obtained reviewed.  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  There is some heterotrophic ossification present in the fifth metatarsal base but does not appear to be progressive or have any changes consistent with osteomyelitis. -I sharply debrided the wound utilizing a 312 with scalpel down to healthy, bleeding, granular tissue.  We will continue with daily dressing changes with Prisma every  other day.  -She is had no signs of infection and continue to monitor very closely.  The wound is doing better.  Continue the cam boot and limiting weightbearing as much as possible. -Monitor for any clinical signs or symptoms of infection and directed to call the office immediately should any occur or go to the ER.  Return in about 1 week (around 05/31/2018).  Celesta Gentile, DPM

## 2018-05-27 DIAGNOSIS — H40033 Anatomical narrow angle, bilateral: Secondary | ICD-10-CM | POA: Diagnosis not present

## 2018-05-31 ENCOUNTER — Ambulatory Visit (INDEPENDENT_AMBULATORY_CARE_PROVIDER_SITE_OTHER): Payer: Medicare Other | Admitting: Podiatry

## 2018-05-31 ENCOUNTER — Encounter: Payer: Self-pay | Admitting: Podiatry

## 2018-05-31 ENCOUNTER — Ambulatory Visit: Payer: Medicare Other | Admitting: Family Medicine

## 2018-05-31 VITALS — Temp 98.6°F

## 2018-05-31 DIAGNOSIS — L97521 Non-pressure chronic ulcer of other part of left foot limited to breakdown of skin: Secondary | ICD-10-CM

## 2018-06-03 ENCOUNTER — Ambulatory Visit (INDEPENDENT_AMBULATORY_CARE_PROVIDER_SITE_OTHER): Payer: Medicare Other | Admitting: Family Medicine

## 2018-06-03 ENCOUNTER — Encounter: Payer: Self-pay | Admitting: Family Medicine

## 2018-06-03 VITALS — BP 130/70 | HR 87 | Temp 98.6°F | Resp 16 | Ht 60.0 in | Wt 185.0 lb

## 2018-06-03 DIAGNOSIS — IMO0002 Reserved for concepts with insufficient information to code with codable children: Secondary | ICD-10-CM

## 2018-06-03 DIAGNOSIS — E11621 Type 2 diabetes mellitus with foot ulcer: Secondary | ICD-10-CM | POA: Diagnosis not present

## 2018-06-03 DIAGNOSIS — E1142 Type 2 diabetes mellitus with diabetic polyneuropathy: Secondary | ICD-10-CM

## 2018-06-03 DIAGNOSIS — E1165 Type 2 diabetes mellitus with hyperglycemia: Secondary | ICD-10-CM

## 2018-06-03 DIAGNOSIS — E1143 Type 2 diabetes mellitus with diabetic autonomic (poly)neuropathy: Secondary | ICD-10-CM

## 2018-06-03 DIAGNOSIS — G4733 Obstructive sleep apnea (adult) (pediatric): Secondary | ICD-10-CM | POA: Diagnosis not present

## 2018-06-03 DIAGNOSIS — J4520 Mild intermittent asthma, uncomplicated: Secondary | ICD-10-CM | POA: Diagnosis not present

## 2018-06-03 DIAGNOSIS — M86172 Other acute osteomyelitis, left ankle and foot: Secondary | ICD-10-CM | POA: Diagnosis not present

## 2018-06-03 DIAGNOSIS — I10 Essential (primary) hypertension: Secondary | ICD-10-CM | POA: Diagnosis not present

## 2018-06-03 DIAGNOSIS — Z6836 Body mass index (BMI) 36.0-36.9, adult: Secondary | ICD-10-CM | POA: Diagnosis not present

## 2018-06-03 DIAGNOSIS — K2100 Gastro-esophageal reflux disease with esophagitis, without bleeding: Secondary | ICD-10-CM

## 2018-06-03 DIAGNOSIS — E113313 Type 2 diabetes mellitus with moderate nonproliferative diabetic retinopathy with macular edema, bilateral: Secondary | ICD-10-CM | POA: Diagnosis not present

## 2018-06-03 DIAGNOSIS — D649 Anemia, unspecified: Secondary | ICD-10-CM | POA: Diagnosis not present

## 2018-06-03 DIAGNOSIS — L97421 Non-pressure chronic ulcer of left heel and midfoot limited to breakdown of skin: Secondary | ICD-10-CM | POA: Diagnosis not present

## 2018-06-03 DIAGNOSIS — L97522 Non-pressure chronic ulcer of other part of left foot with fat layer exposed: Secondary | ICD-10-CM | POA: Diagnosis not present

## 2018-06-03 MED ORDER — PANTOPRAZOLE SODIUM 40 MG PO TBEC: 40.0000 mg | DELAYED_RELEASE_TABLET | Freq: Every day | ORAL | 1 refills | Status: DC

## 2018-06-03 MED ORDER — ALBUTEROL SULFATE HFA 108 (90 BASE) MCG/ACT IN AERS: 2.0000 | INHALATION_SPRAY | Freq: Four times a day (QID) | RESPIRATORY_TRACT | 3 refills | Status: DC | PRN

## 2018-06-03 NOTE — Progress Notes (Signed)
Name: Olivia Romero   MRN: [id number]    DOB: February 22, 1956   Date:06/04/2018       Progress Note  Subjective  Chief Complaint  Chief Complaint  Patient presents with  . Hypertension  . Diabetes    HPI  Type 2 diabetes: seeing Dr. Gabriel Carina, endocrinologist; most recent A1C was 7.0 which is the lowest it has been in quite some time.  He BG's range from 70-90's and is taking 20 units basal insulin, invokamet and amaryl.  She has a history of foot ulcer - checking feet daily. She had recent surgery on the LEFT foot to remove a piece of bone, and has a home health nurse that helps change her bandages on the LEFT side. Urine Micro UTD? Yes ACEI/ARB: No - had been on ACE-I, was taking off of lisinopril and was put on HCTZ.  Statin: No - had abnormal labs on lipitor, so she cannot take statin therapy; she is on fenofibrate instead.  Aspirin therapy: Yes Eye Exam: We will request records.  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 SManuella Ghaziwith neurologist.  HTN: BP has been stable.  Taking torsemide PRN and HCTZ daily. BP is at goal today. Denies chest pain, shortness of breath, headaches, vision changes, no BLE edema.   OSA: Using CPAP most nights; doing well on this at this time.  Asthma: No nighttime coughing, no shortness of breath, no wheezing.  Uses albuterol PRN.  GERD/Esophagitis: Had EGD by Dr. ETiffany Kochera few years ago - was told she had spasms possibly related to uncontrolled DM.  She had not had any issues lately, taking protonix. Does not follow up with Dr. ETiffany Kocherany longer. Avoids red sauces/foods and this helps control her symptoms.  Obesity: She is in surgical boot on LEFT foot due to recent surgery (seeing Dr. WEarleen [place].  Not able to exercise.  Not as hungry as she used to be, but is trying to eat more fruit, eating a healthier diet.  She is tired today  because she hasn't slept much due to 2 friends passing away. Encouraged her to eat low fat diet to help with cholesterol and low carb to help with DM. Body mass index is 36.13 kg/m.  Patient Active Problem List   Diagnosis Date Noted  . BMI 36.0-36.9,adult   . Myalgia due to statin 11/19/2017  . Morbid obesity ([place] 11/19/2017  . Uncontrolled type 2 diabetes mellitus with hyperglycemia ([place] 11/01/2017  . B12 deficiency 11/01/2017  . Skin ulcer of right ankle, limited to breakdown of skin ([place] 12/31/2016  . Diabetic foot ulcer ([facility] 12/14/2016  . Diabetic polyneuropathy associated with type 2 diabetes mellitus ([facility] 12/13/2015  . Lumbar stenosis with neurogenic claudication 09/02/2015  . 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 ([place] 07/13/2015  . Chronic constipation 07/13/2015  . OSA (obstructive sleep apnea) 06/25/2015  . 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/23/2015  . Hypertriglyceridemia 04/23/2015  . Statin intolerance 04/23/2015  . Asthma, mild intermittent 02/01/2015  . Type 2 diabetes mellitus with diabetic nephropathy ([facility] 12/31/2014  . Gastroesophageal reflux disease with esophagitis 12/31/2014  . Dysphagia 12/31/2014  . Bilateral carpal tunnel syndrome 12/01/2014  . Benign neoplasm of colon 11/30/2012  . Diverticulosis of colon (without mention of hemorrhage) 11/30/2012  . Essential hypertension, benign 11/28/2012    Past Surgical History:  Procedure Laterality Date  .  ABDOMINAL HYSTERECTOMY    . BACK SURGERY  09/02/2015  . CERVICAL FUSION    . CESAREAN SECTION  1986  . COLONOSCOPY N/A 11/30/2012   Procedure: COLONOSCOPY;  Surgeon: Irene Shipper, MD;  Location: [facility] ENDOSCOPY;  Service: Endoscopy;  Laterality: N/A;  . COLONOSCOPY WITH PROPOFOL N/A 01/03/2016   Procedure: COLONOSCOPY WITH PROPOFOL;  Surgeon: Manya Silvas, MD;  Location: [facility] ENDOSCOPY;  Service: Endoscopy;  Laterality: N/A;  . ESOPHAGOGASTRODUODENOSCOPY (EGD) WITH  PROPOFOL N/A 01/14/2015   Procedure: ESOPHAGOGASTRODUODENOSCOPY (EGD) WITH PROPOFOL;  Surgeon: Manya Silvas, MD;  Location: [facility] ENDOSCOPY;  Service: Endoscopy;  Laterality: N/A;  . IRRIGATION AND DEBRIDEMENT FOOT Left 03/13/2018   Procedure: IRRIGATION AND DEBRIDEMENT FOOT WITH BONE BIOPSY WITH MISONIX DEBRIDER;  Surgeon: Evelina Bucy, DPM;  Location: [place];  Service: Podiatry;  Laterality: Left;  . LUMBAR WOUND DEBRIDEMENT N/A 10/01/2015   Procedure: LUMBAR WOUND DEBRIDEMENT;  Surgeon: Ashok Pall, MD;  Location: [facility]URO ORS;  Service: Neurosurgery;  Laterality: N/A;  LUMBAR WOUND DEBRIDEMENT  . SAVORY DILATION N/A 01/14/2015   Procedure: SAVORY DILATION;  Surgeon: Manya Silvas, MD;  Location[facility] END[facility]COPY;  Service: Endoscopy;  Laterality: N/A;  . TONSILLECTOMY      Family History  Problem Relation Age of Onset  . Lung cancer Father   . Hypertension Father   . Arthritis Father   . Other Mother        hardening of the arteries/renal cell carcenoma  . Hypertension Mother   . Stroke Mother   . Kidney cancer Mother   . Arthritis Brother   . Rheum arthritis Maternal Uncle   . Bladder Cancer Neg Hx     Social History   Socioeconomic History  . Marital status: Single    Spouse name: Not on file  . Number of children: 1  . Years of education: Not on file  . Highest education level: Not on file  Occupational History  . Occupation: histology Engineer, production: [employer]  Social Needs  . Financial resource strain: Not on file  . Food insecurity:    Worry: Not on file    Inability: Not on file  . Transportation needs:    Medical: Not on file    Non-medical: Not on file  Tobacco Use  . Smoking status: Never Smoker  . Smokeless tobacco: Never Used  Substance and Sexual Activity  . Alcohol use: Never    Frequency: Never  . Drug use: No  . Sexual activity: Not Currently  Lifestyle  . Physical activity:    Days per week: Not on file    Minutes per session: Not on  file  . Stress: Not on file  Relationships  . Social connections:    Talks on phone: Not on file    Gets together: Not on file    Attends religious service: Not on file    Active member of club or organization: Not on file    Attends meetings of clubs or organizations: Not on file    Relationship status: Not on file  . Intimate partner violence:    Fear of current or ex partner: Not on file    Emotionally abused: Not on file    Physically abused: Not on file    Forced sexual activity: Not on file  Other Topics Concern  . Not on file  Social History Narrative  . Not on file     Current Outpatient Medications:  .  acetaminophen (TYLENOL) 500 MG tablet, Take 1,000 mg by mouth every 6 (six) hours as needed for mild pain. , Disp: , Rfl:  .  albuterol (PROVENTIL HFA;VENTOLIN HFA) 108 (90 Base) MCG/ACT inhaler, Inhale 2 puffs into the lungs every 6 (six) hours as needed for wheezing or shortness of breath., Disp: 1 Inhaler, Rfl: 3 .  aspirin EC 325 MG tablet, Take 1 tablet (325 mg total) by mouth daily., Disp: 30 tablet, Rfl: 0 .  Blood Glucose Monitoring Suppl (CONTOUR NEXT MONITOR) w/Device KIT, as directed. , Disp: , Rfl:  .  DULoxetine (CYMBALTA) 60 MG capsule, Take 60 mg by mouth at bedtime., Disp: , Rfl:  .  fenofibrate (TRICOR) 145 MG tablet, Take 145 mg by mouth daily., Disp: , Rfl: 10 .  gabapentin (NEURONTIN) 400 MG capsule, Take 800 mg by mouth 3 (three) times daily. , Disp: , Rfl:  .  glimepiride (AMARYL) 2 MG tablet, Take 2 mg by mouth 2 (two) times daily. , Disp: , Rfl:  .  glucose blood (CONTOUR NEXT TEST) test strip, as directed. , Disp: , Rfl:  .  hydrochlorothiazide (HYDRODIURIL) 12.5 MG tablet, Take 12.5 mg by mouth daily. , Disp: , Rfl:  .  Insulin Degludec (TRESIBA FLEXTOUCH) 200 UNIT/ML SOPN, Inject 20 Units into the skin at bedtime. , Disp: , Rfl:  .  INVOKAMET XR 150-1000 MG TB24, Take 2 tablets by mouth at bedtime. , Disp: , Rfl: 10 .  Multiple Vitamin  (MULTIVITAMIN WITH MINERALS) TABS, Take 1 tablet by mouth daily. Reported on 07/21/2015, Disp: , Rfl:  .  pantoprazole (PROTONIX) 40 MG tablet, Take 1 tablet (40 mg total) by mouth daily., Disp: 90 tablet, Rfl: 1 .  torsemide (DEMADEX) 20 MG tablet, Take 1 tablet (20 mg total) by mouth every other day., Disp: 15 tablet, Rfl: 2  Allergies  Allergen Reactions  . Eggs Or Egg-Derived Products Swelling and Other (See Comments)    Angioedema  . Atorvastatin Other (See Comments)    Liver toxicity  . Flu Virus Vaccine Swelling    Arm swelled (site of injection)  . Latex Itching  . Pravastatin Itching and Rash  . Tape Rash and Other (See Comments)    TAPE PULLS OFF THE SKIN!!! Please use an alternative!!    I personally reviewed active problem list, medication list, allergies, notes from last encounter, lab results with the patient/caregiver today.   ROS  Constitutional: Negative for fever or weight change.  Respiratory: Negative for cough and shortness of breath.   Cardiovascular: Negative for chest pain or palpitations.  Gastrointestinal: Negative for abdominal pain, no bowel changes.  Musculoskeletal: Negative for gait problem or joint swelling.  Skin: Negative for rash.  Neurological: Negative for dizziness or headache.  No other specific complaints in a complete review of systems (except as listed in HPI above).  Objective  Vitals:   06/03/18 1042  BP: 130/70  Pulse: 87  Resp: 16  Temp: 98.6 F (37 C)  TempSrc: Oral  SpO2: 95%  Weight: 185 lb (83.9 kg)  Height: 5' (1.524 m)   Body mass index is 36.13 kg/m.  Physical Exam  Constitutional: Patient appears well-developed and well-nourished. No distress.  HENT: Head: Normocephalic and atraumatic. Eyes: Conjunctivae and EOM are normal. No scleral icterus.  Pupils are equal, round, and reactive to light.  Neck: Normal range of motion. Neck supple. No JVD present. No thyromegaly present.  Cardiovascular: Normal rate,  regular rhythm and normal heart sounds.  No murmur   heard. No BLE edema. Pulmonary/Chest: Effort normal and breath sounds normal. No respiratory distress. Musculoskeletal: Normal range of motion, no joint effusions. No gross deformities Neurological: Pt is alert and oriented to person, place, and time. No cranial nerve deficit. Coordination, balance, strength, speech and gait are normal.  Skin: Skin is warm and dry. No rash noted. No erythema.  Psychiatric: Patient has a normal mood and affect. behavior is normal. Judgment and thought content normal.  No results found for this or any previous visit (from the past 72 hour(s)).  PHQ2/9: Depression screen [facility] 2/9 04/16/2018 11/19/2017 01/31/2017 01/19/2017 12/28/2016  Decreased Interest 0 0 0 0 0  Down, Depressed, Hopeless 0 0 0 0 0  PHQ - 2 Score 0 0 0 0 0  Altered sleeping - 0 - - -  Tired, decreased energy - 0 - - -  Change in appetite - 0 - - -  Feeling bad or failure about yourself  - 0 - - -  Trouble concentrating - 0 - - -  Moving slowly or fidgety/restless - 0 - - -  Suicidal thoughts - 0 - - -  PHQ-9 Score - 0 - - -  Difficult doing work/chores - Not difficult at all - - -   Fall Risk: Fall Risk  06/03/2018 04/16/2018 03/21/2018 03/11/2018 11/19/2017  Falls in the past year? 0 0 No No No  Number falls in past yr: 0 0 - - -  Injury with Fall? 0 1 - - -  Follow up - - - - -   Assessment & Plan  1. 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 Nilda Simmer  2. Essential hypertension, benign - Continue current medication regimen.   3. OSA (obstructive sleep apnea) - Continue use of CPAP  4. Mild intermittent asthma without complication - albuterol (PROVENTIL HFA;VENTOLIN HFA) 108 (90 Base) MCG/ACT inhaler; Inhale 2 puffs into the lungs every 6 (six) hours as needed for wheezing or shortness of breath.  Dispense: 1 Inhaler; Refill: 3  5. Diabetic polyneuropathy associated with type 2  diabetes mellitus ([place]) - Taking cymbalta and neurontin, seeing Dr. Manuella Ghazi with Neurology.  6. Gastroesophageal reflux disease with esophagitis - .discussed long-term risk of PPI use, she would like to remain on for the time being. - pantoprazole (PROTONIX) 40 MG tablet; Take 1 tablet (40 mg total) by mouth daily.  Dispense: 90 tablet; Refill: 1  7. Morbid obesity ([place]) 8. BMI 36.0-36.9,adult Discussed importance of 150 minutes of physical activity weekly, eat two servings of fish weekly, eat one serving of tree nuts ( cashews, pistachios, pecans, almonds.[name]) every other day, eat 6 servings of fruit/vegetables daily and drink plenty of water and avoid sweet beverages.

## 2018-06-03 NOTE — Patient Instructions (Signed)
Diabetes Mellitus and Nutrition, Adult  When you have diabetes (diabetes mellitus), it is very important to have healthy eating habits because your blood sugar (glucose) levels are greatly affected by what you eat and drink. Eating healthy foods in the appropriate amounts, at about the same times every day, can help you:  · Control your blood glucose.  · Lower your risk of heart disease.  · Improve your blood pressure.  · Reach or maintain a healthy weight.  Every person with diabetes is different, and each person has different needs for a meal plan. Your health care provider may recommend that you work with a diet and nutrition specialist (dietitian) to make a meal plan that is best for you. Your meal plan may vary depending on factors such as:  · The calories you need.  · The medicines you take.  · Your weight.  · Your blood glucose, blood pressure, and cholesterol levels.  · Your activity level.  · Other health conditions you have, such as heart or kidney disease.  How do carbohydrates affect me?  Carbohydrates, also called carbs, affect your blood glucose level more than any other type of food. Eating carbs naturally raises the amount of glucose in your blood. Carb counting is a method for keeping track of how many carbs you eat. Counting carbs is important to keep your blood glucose at a healthy level, especially if you use insulin or take certain oral diabetes medicines.  It is important to know how many carbs you can safely have in each meal. This is different for every person. Your dietitian can help you calculate how many carbs you should have at each meal and for each snack.  Foods that contain carbs include:  · Bread, cereal, rice, pasta, and crackers.  · Potatoes and corn.  · Peas, beans, and lentils.  · Milk and yogurt.  · Fruit and juice.  · Desserts, such as cakes, cookies, ice cream, and candy.  How does alcohol affect me?  Alcohol can cause a sudden decrease in blood glucose (hypoglycemia),  especially if you use insulin or take certain oral diabetes medicines. Hypoglycemia can be a life-threatening condition. Symptoms of hypoglycemia (sleepiness, dizziness, and confusion) are similar to symptoms of having too much alcohol.  If your health care provider says that alcohol is safe for you, follow these guidelines:  · Limit alcohol intake to no more than 1 drink per day for nonpregnant women and 2 drinks per day for men. One drink equals 12 oz of beer, 5 oz of wine, or 1½ oz of hard liquor.  · Do not drink on an empty stomach.  · Keep yourself hydrated with water, diet soda, or unsweetened iced tea.  · Keep in mind that regular soda, juice, and other mixers may contain a lot of sugar and must be counted as carbs.  What are tips for following this plan?    Reading food labels  · Start by checking the serving size on the "Nutrition Facts" label of packaged foods and drinks. The amount of calories, carbs, fats, and other nutrients listed on the label is based on one serving of the item. Many items contain more than one serving per package.  · Check the total grams (g) of carbs in one serving. You can calculate the number of servings of carbs in one serving by dividing the total carbs by 15. For example, if a food has 30 g of total carbs, it would be equal to 2   servings of carbs.  · Check the number of grams (g) of saturated and trans fats in one serving. Choose foods that have low or no amount of these fats.  · Check the number of milligrams (mg) of salt (sodium) in one serving. Most people should limit total sodium intake to less than 2,300 mg per day.  · Always check the nutrition information of foods labeled as "low-fat" or "nonfat". These foods may be higher in added sugar or refined carbs and should be avoided.  · Talk to your dietitian to identify your daily goals for nutrients listed on the label.  Shopping  · Avoid buying canned, premade, or processed foods. These foods tend to be high in fat, sodium,  and added sugar.  · Shop around the outside edge of the grocery store. This includes fresh fruits and vegetables, bulk grains, fresh meats, and fresh dairy.  Cooking  · Use low-heat cooking methods, such as baking, instead of high-heat cooking methods like deep frying.  · Cook using healthy oils, such as olive, canola, or sunflower oil.  · Avoid cooking with butter, cream, or high-fat meats.  Meal planning  · Eat meals and snacks regularly, preferably at the same times every day. Avoid going long periods of time without eating.  · Eat foods high in fiber, such as fresh fruits, vegetables, beans, and whole grains. Talk to your dietitian about how many servings of carbs you can eat at each meal.  · Eat 4-6 ounces (oz) of lean protein each day, such as lean meat, chicken, fish, eggs, or tofu. One oz of lean protein is equal to:  ? 1 oz of meat, chicken, or fish.  ? 1 egg.  ? ¼ cup of tofu.  · Eat some foods each day that contain healthy fats, such as avocado, nuts, seeds, and fish.  Lifestyle  · Check your blood glucose regularly.  · Exercise regularly as told by your health care provider. This may include:  ? 150 minutes of moderate-intensity or vigorous-intensity exercise each week. This could be brisk walking, biking, or water aerobics.  ? Stretching and doing strength exercises, such as yoga or weightlifting, at least 2 times a week.  · Take medicines as told by your health care provider.  · Do not use any products that contain nicotine or tobacco, such as cigarettes and e-cigarettes. If you need help quitting, ask your health care provider.  · Work with a counselor or diabetes educator to identify strategies to manage stress and any emotional and social challenges.  Questions to ask a health care provider  · Do I need to meet with a diabetes educator?  · Do I need to meet with a dietitian?  · What number can I call if I have questions?  · When are the best times to check my blood glucose?  Where to find more  information:  · American Diabetes Association: diabetes.org  · Academy of Nutrition and Dietetics: www.eatright.org  · National Institute of Diabetes and Digestive and Kidney Diseases (NIH): www.niddk.nih.gov  Summary  · A healthy meal plan will help you control your blood glucose and maintain a healthy lifestyle.  · Working with a diet and nutrition specialist (dietitian) can help you make a meal plan that is best for you.  · Keep in mind that carbohydrates (carbs) and alcohol have immediate effects on your blood glucose levels. It is important to count carbs and to use alcohol carefully.  This information is not intended to   replace advice given to you by your health care provider. Make sure you discuss any questions you have with your health care provider.  Document Released: 02/16/2005 Document Revised: 12/20/2016 Document Reviewed: 06/26/2016  Elsevier Interactive Patient Education © 2019 Elsevier Inc.

## 2018-06-04 ENCOUNTER — Ambulatory Visit: Payer: Medicare Other | Admitting: Orthotics

## 2018-06-04 DIAGNOSIS — E1149 Type 2 diabetes mellitus with other diabetic neurological complication: Secondary | ICD-10-CM

## 2018-06-04 DIAGNOSIS — L97522 Non-pressure chronic ulcer of other part of left foot with fat layer exposed: Secondary | ICD-10-CM

## 2018-06-04 DIAGNOSIS — Z9889 Other specified postprocedural states: Secondary | ICD-10-CM

## 2018-06-04 DIAGNOSIS — M14672 Charcot's joint, left ankle and foot: Secondary | ICD-10-CM

## 2018-06-04 DIAGNOSIS — L97521 Non-pressure chronic ulcer of other part of left foot limited to breakdown of skin: Secondary | ICD-10-CM

## 2018-06-04 DIAGNOSIS — M216X2 Other acquired deformities of left foot: Secondary | ICD-10-CM

## 2018-06-04 LAB — HM DIABETES EYE EXAM

## 2018-06-04 NOTE — Progress Notes (Signed)
Subjective: Olivia Romero is a 62 y.o. is seen today for follow-up evaluation of a wound to the left foot.  She does admit that she has been on her foot quite a bit over the Christmas holiday.  She is not sure how the foot is doing but she denies any drainage or pus coming from the area and overall she denies any fevers, chills, nausea, vomiting.  She has been doing every other day dressing changes with Prisma.   Objective: General: No acute distress, AAOx3  DP/PT pulses palpable 2/4, CRT < 3 sec to all digits.  LEFT foot: Incision is well coapted without any evidence of dehiscence and a scar has formed and the incision is well healed.  Overall to the lateral aspect foot the wound continues to improve.  Today the wound measures 1 x 0.3 cm which appears to be about the same but there is some macerated tissue along the periphery of the wound.  There is no probing, undermining or tunneling.  There is no surrounding erythema, ascending cellulitis.  Minimal increase in swelling compared to last appointment.  There is no fluctuation capitation or any malodor.  No purulence.   No pain with calf compression, swelling, warmth, erythema.   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  I debrided the wound to healthy, granular tissue utilizing a 312 blade scalpel.  There is no other signs of infection today and there is no other ulcerations identified.  We are going to switch to a small amount of Betadine daily due to macerated tissue.  Continue cam boot and elevation and limit activity. -Monitor for any clinical signs or symptoms of infection and directed to call the office immediately should any occur or go to the ER.  Trula Slade DPM

## 2018-06-04 NOTE — Progress Notes (Signed)
Brace has to be returned due to not fitting right in calf.  Talked to Lelon Frohlich at [facility] and she gave me RA

## 2018-06-07 ENCOUNTER — Ambulatory Visit (INDEPENDENT_AMBULATORY_CARE_PROVIDER_SITE_OTHER): Payer: Medicare Other | Admitting: Podiatry

## 2018-06-07 DIAGNOSIS — L97521 Non-pressure chronic ulcer of other part of left foot limited to breakdown of skin: Secondary | ICD-10-CM

## 2018-06-07 DIAGNOSIS — M21542 Acquired clubfoot, left foot: Secondary | ICD-10-CM

## 2018-06-07 DIAGNOSIS — M14672 Charcot's joint, left ankle and foot: Secondary | ICD-10-CM

## 2018-06-10 ENCOUNTER — Telehealth: Payer: Self-pay | Admitting: *Deleted

## 2018-06-10 DIAGNOSIS — L97421 Non-pressure chronic ulcer of left heel and midfoot limited to breakdown of skin: Secondary | ICD-10-CM | POA: Diagnosis not present

## 2018-06-10 DIAGNOSIS — L97522 Non-pressure chronic ulcer of other part of left foot with fat layer exposed: Secondary | ICD-10-CM | POA: Diagnosis not present

## 2018-06-10 DIAGNOSIS — E113311 Type 2 diabetes mellitus with moderate nonproliferative diabetic retinopathy with macular edema, right eye: Secondary | ICD-10-CM | POA: Diagnosis not present

## 2018-06-10 DIAGNOSIS — E11621 Type 2 diabetes mellitus with foot ulcer: Secondary | ICD-10-CM | POA: Diagnosis not present

## 2018-06-10 DIAGNOSIS — I10 Essential (primary) hypertension: Secondary | ICD-10-CM | POA: Diagnosis not present

## 2018-06-10 DIAGNOSIS — M86172 Other acute osteomyelitis, left ankle and foot: Secondary | ICD-10-CM | POA: Diagnosis not present

## 2018-06-10 DIAGNOSIS — D649 Anemia, unspecified: Secondary | ICD-10-CM | POA: Diagnosis not present

## 2018-06-10 NOTE — Telephone Encounter (Signed)
This is my fault I forgot to put it in on Friday. Can you please have them put unna boots on? Thanks.

## 2018-06-10 NOTE — Telephone Encounter (Signed)
[place] states pt has a new dressing and she needs new orders for wound care.

## 2018-06-11 NOTE — Telephone Encounter (Signed)
I called, [place] of Dr. Leigh Aurora orders for unna boots 3 times week. Becky Sax states she applied yesterday and pt has an appt this week so will apply only 2 times this week, and 3 times the following weeks. Faxed orders to [place].

## 2018-06-12 ENCOUNTER — Telehealth: Payer: Self-pay | Admitting: *Deleted

## 2018-06-12 DIAGNOSIS — M14672 Charcot's joint, left ankle and foot: Secondary | ICD-10-CM

## 2018-06-12 DIAGNOSIS — M21542 Acquired clubfoot, left foot: Secondary | ICD-10-CM

## 2018-06-12 DIAGNOSIS — L97521 Non-pressure chronic ulcer of other part of left foot limited to breakdown of skin: Secondary | ICD-10-CM

## 2018-06-12 NOTE — Telephone Encounter (Signed)
Referral, clinicals and demographics faxed to [facility] - Dr. Gardiner Fanti.

## 2018-06-12 NOTE — Telephone Encounter (Signed)
-----   Message from Trula Slade, DPM sent at 06/12/2018  8:29 AM EST ----- Can you please put in for a referral for [facility], Dr. Gardiner Fanti? Thanks.

## 2018-06-12 NOTE — Progress Notes (Signed)
Subjective: Olivia Romero is a 63 y.o. is seen today for follow-up evaluation of a wound to the left foot.  She states overall the wound has been getting better but she does admit that she has been on her foot quite a bit.  She is been wearing the cam boot.  Home health is been coming out on Mondays and she changes the bandage on Wednesdays.  She denies any drainage or pus or any redness.  There has been some increase in swelling because she has been on her feet quite a bit.  She denies any fevers, chills, nausea, vomiting.  Denies any calf pain, chest pain, shortness of breath.  Objective: General: No acute distress, AAOx3  DP/PT pulses palpable 2/4, CRT < 3 sec to all digits.  LEFT foot: Incision is well coapted without any evidence of dehiscence and a scar has formed and the incision is well healed.  Overall to the lateral aspect foot a wound discontinued.  Today the wound is slightly larger 1.4 x 0.7 cm and is superficial with a granular wound base.  There is no surrounding erythema, ascending cellulitis.  There is no fluctuation or crepitation or any malodor but there is still swelling to the foot but does not appear to be significantly worsened compared to last appointment.  No pain to the area.  No other open lesions identified. No pain with calf compression, swelling, warmth, erythema.   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  There is no significant hyperkeratotic tissue.  I did lightly debride the wound today.  Area was cleaned.  Unna boot was applied today due to the swelling.  We will continue with this to see if this will help reduce some of the swelling and also to help with wound healing.  Continue in the cam boot and limit activities much as possible. I have been trying to get her a [name] walker but it has been  shipped twice incorrectly. 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  Ultimately I do think she is going to require surgical intervention to help correct the deformity. I am going to put in for a consult by [place] for her given the extent of the deformity.  She is in agreement to this.  Trula Slade DPM

## 2018-06-13 ENCOUNTER — Ambulatory Visit (INDEPENDENT_AMBULATORY_CARE_PROVIDER_SITE_OTHER): Payer: Medicare Other | Admitting: Podiatry

## 2018-06-13 DIAGNOSIS — M14672 Charcot's joint, left ankle and foot: Secondary | ICD-10-CM

## 2018-06-13 DIAGNOSIS — L97521 Non-pressure chronic ulcer of other part of left foot limited to breakdown of skin: Secondary | ICD-10-CM | POA: Diagnosis not present

## 2018-06-17 DIAGNOSIS — E11621 Type 2 diabetes mellitus with foot ulcer: Secondary | ICD-10-CM | POA: Diagnosis not present

## 2018-06-17 DIAGNOSIS — I10 Essential (primary) hypertension: Secondary | ICD-10-CM | POA: Diagnosis not present

## 2018-06-17 DIAGNOSIS — L97522 Non-pressure chronic ulcer of other part of left foot with fat layer exposed: Secondary | ICD-10-CM | POA: Diagnosis not present

## 2018-06-17 DIAGNOSIS — D649 Anemia, unspecified: Secondary | ICD-10-CM | POA: Diagnosis not present

## 2018-06-17 DIAGNOSIS — L97421 Non-pressure chronic ulcer of left heel and midfoot limited to breakdown of skin: Secondary | ICD-10-CM | POA: Diagnosis not present

## 2018-06-17 DIAGNOSIS — M86172 Other acute osteomyelitis, left ankle and foot: Secondary | ICD-10-CM | POA: Diagnosis not present

## 2018-06-18 ENCOUNTER — Other Ambulatory Visit: Payer: Self-pay | Admitting: Family Medicine

## 2018-06-18 MED ORDER — ALBUTEROL SULFATE 108 (90 BASE) MCG/ACT IN AEPB: 1.0000 | INHALATION_SPRAY | Freq: Four times a day (QID) | RESPIRATORY_TRACT | 2 refills | Status: DC | PRN

## 2018-06-20 ENCOUNTER — Telehealth: Payer: Self-pay | Admitting: *Deleted

## 2018-06-20 NOTE — Telephone Encounter (Signed)
Yes, take the unna boot off. She can come in if she can today or she can cut it off. If she has betadine she can put that over it in order to help dry it. If there is any redness, pus or signs of infection go to the ER.

## 2018-06-20 NOTE — Telephone Encounter (Signed)
I called pt and informed of Dr. Leigh Aurora orders. Pt states it doesn't look infected, looks good, and she thinks the itching was from sweating. Pt states she has an appt tomorrow.

## 2018-06-20 NOTE — Telephone Encounter (Signed)
Pt states she has an appt tomorrow, but the unna boot is moist and making her foot itch, and the wound may be leaking. I told pt to remove the unna boot and we would see her tomorrow.

## 2018-06-21 ENCOUNTER — Encounter: Payer: Self-pay | Admitting: Podiatry

## 2018-06-21 ENCOUNTER — Ambulatory Visit (INDEPENDENT_AMBULATORY_CARE_PROVIDER_SITE_OTHER): Payer: Medicare Other | Admitting: Podiatry

## 2018-06-21 VITALS — BP 107/66 | HR 89 | Temp 98.6°F | Resp 14

## 2018-06-21 DIAGNOSIS — L97521 Non-pressure chronic ulcer of other part of left foot limited to breakdown of skin: Secondary | ICD-10-CM | POA: Diagnosis not present

## 2018-06-22 NOTE — Progress Notes (Signed)
Subjective: Olivia Romero is a 63 y.o. is seen today for follow-up evaluation of a wound to the left foot.  She says that overall she is doing better.  She presents today for [employer] change.  She denies any pain.  She has been wearing the cam boot as much as possible and try to be off the foot as well.  She has no new concerns and she denies any fevers, chills, nausea, vomiting.  She denies any calf pain, chest pain, shortness of breath.   Objective: General: No acute distress, AAOx3  DP/PT pulses palpable 2/4, CRT < 3 sec to all digits.  LEFT foot: Incision is well coapted without any evidence of dehiscence and a scar has formed and the incision is well healed.  Overall to the lateral aspect foot a wound discontinued.  Today the wound measures 0.7 x 0.4 cm and is superficial with a granular wound base.  There is no surrounding erythema, ascending cellulitis.  There is significant provement in the edema compared to last appointment.  There is no erythema or warmth.  No clinical signs of infection are noted today. No pain with calf compression, swelling, warmth, erythema.   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  A new Unna boot was applied.  -Continue cam boot at all times encouraged elevation and limit activity. -Follow-up as scheduled or sooner if needed.  Trula Slade DPM

## 2018-06-24 DIAGNOSIS — L97522 Non-pressure chronic ulcer of other part of left foot with fat layer exposed: Secondary | ICD-10-CM | POA: Diagnosis not present

## 2018-06-24 DIAGNOSIS — I10 Essential (primary) hypertension: Secondary | ICD-10-CM | POA: Diagnosis not present

## 2018-06-24 DIAGNOSIS — M86172 Other acute osteomyelitis, left ankle and foot: Secondary | ICD-10-CM | POA: Diagnosis not present

## 2018-06-24 DIAGNOSIS — D649 Anemia, unspecified: Secondary | ICD-10-CM | POA: Diagnosis not present

## 2018-06-24 DIAGNOSIS — E11621 Type 2 diabetes mellitus with foot ulcer: Secondary | ICD-10-CM | POA: Diagnosis not present

## 2018-06-24 DIAGNOSIS — L97421 Non-pressure chronic ulcer of left heel and midfoot limited to breakdown of skin: Secondary | ICD-10-CM | POA: Diagnosis not present

## 2018-06-25 NOTE — Progress Notes (Signed)
Subjective: Olivia Romero is a 63 y.o. is seen today for follow-up evaluation of a wound to the left foot.  Overall she states that she is doing better.  She was on her feet a lot yesterday had some increased swelling but she denies any drainage or pus coming from the area.  As she was on her feet a lot more she did have to go ahead and remove the Unna boot because it had drainage.  She took it off and she applied Betadine to the wound followed by dry dressing.  She is remained in the cam boot.  She has an upcoming appointment with [facility] in the next 2 weeks. She has no new concerns and she denies any fevers, chills, nausea, vomiting.  She denies any calf pain, chest pain, shortness of breath.   Objective: General: No acute distress, AAOx3  DP/PT pulses palpable 2/4, CRT < 3 sec to all digits.  LEFT foot: Incision is well coapted without any evidence of dehiscence and a scar has formed and the incision is well healed.    Today the wound measures 1.5 x 0.5 cm and is superficial with a granular wound base.  Overall the wound is somewhat larger.  There is no surrounding erythema, ascending cellulitis.  There is significant provement in the edema compared to last appointment.  There is no erythema or warmth.  No clinical signs of infection are noted today. No pain with calf compression, swelling, warmth, erythema.   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  I did sharply debride the wound today utilizing a tissue nipper as well as a 312 with scalpel down to healthy, bleeding, granular tissue.  We are going to switch back to Prisma dressing change every other day.  -Continue cam boot at all times encouraged elevation and limit activity. -Monitor for any clinical signs or symptoms of infection and directed to call the office  immediately should any occur or go to the ER. -Follow-up as scheduled or sooner if needed.  Trula Slade DPM

## 2018-06-28 ENCOUNTER — Ambulatory Visit (INDEPENDENT_AMBULATORY_CARE_PROVIDER_SITE_OTHER): Payer: Medicare Other | Admitting: Podiatry

## 2018-06-28 ENCOUNTER — Encounter: Payer: Self-pay | Admitting: Podiatry

## 2018-06-28 VITALS — BP 139/80 | HR 95 | Temp 99.4°F | Resp 14

## 2018-06-28 DIAGNOSIS — L97521 Non-pressure chronic ulcer of other part of left foot limited to breakdown of skin: Secondary | ICD-10-CM | POA: Diagnosis not present

## 2018-06-28 DIAGNOSIS — M14672 Charcot's joint, left ankle and foot: Secondary | ICD-10-CM

## 2018-07-01 DIAGNOSIS — E11621 Type 2 diabetes mellitus with foot ulcer: Secondary | ICD-10-CM | POA: Diagnosis not present

## 2018-07-01 DIAGNOSIS — L97522 Non-pressure chronic ulcer of other part of left foot with fat layer exposed: Secondary | ICD-10-CM | POA: Diagnosis not present

## 2018-07-01 DIAGNOSIS — I10 Essential (primary) hypertension: Secondary | ICD-10-CM | POA: Diagnosis not present

## 2018-07-01 DIAGNOSIS — M86172 Other acute osteomyelitis, left ankle and foot: Secondary | ICD-10-CM | POA: Diagnosis not present

## 2018-07-01 DIAGNOSIS — L97421 Non-pressure chronic ulcer of left heel and midfoot limited to breakdown of skin: Secondary | ICD-10-CM | POA: Diagnosis not present

## 2018-07-01 DIAGNOSIS — D649 Anemia, unspecified: Secondary | ICD-10-CM | POA: Diagnosis not present

## 2018-07-03 DIAGNOSIS — Z789 Other specified health status: Secondary | ICD-10-CM | POA: Diagnosis not present

## 2018-07-03 DIAGNOSIS — M79672 Pain in left foot: Secondary | ICD-10-CM | POA: Diagnosis not present

## 2018-07-03 DIAGNOSIS — E1165 Type 2 diabetes mellitus with hyperglycemia: Secondary | ICD-10-CM | POA: Diagnosis not present

## 2018-07-03 DIAGNOSIS — E1142 Type 2 diabetes mellitus with diabetic polyneuropathy: Secondary | ICD-10-CM | POA: Diagnosis not present

## 2018-07-03 DIAGNOSIS — M14672 Charcot's joint, left ankle and foot: Secondary | ICD-10-CM | POA: Diagnosis not present

## 2018-07-03 DIAGNOSIS — M216X1 Other acquired deformities of right foot: Secondary | ICD-10-CM | POA: Diagnosis not present

## 2018-07-03 DIAGNOSIS — Z794 Long term (current) use of insulin: Secondary | ICD-10-CM | POA: Diagnosis not present

## 2018-07-03 DIAGNOSIS — E538 Deficiency of other specified B group vitamins: Secondary | ICD-10-CM | POA: Diagnosis not present

## 2018-07-03 NOTE — Progress Notes (Signed)
Subjective: Olivia Romero is a 63 y.o. is seen today for follow-up evaluation of a wound to the left foot.  She says overall she is doing well.  She states that her nurse did put some gauze on the side of her boot to help offload the wound area as well and this is been helpful.  She denies any drainage or pus or any increase in swelling or redness to her feet.  She has no new concerns today.  She has an upcoming appointment next week with [facility].  She is anxious about this.  She has no new concerns and she denies any fevers, chills, nausea, vomiting.  She denies any calf pain, chest pain, shortness of breath.   Objective: General: No acute distress, AAOx3  DP/PT pulses palpable 2/4, CRT < 3 sec to all digits.  LEFT foot: Incision is well coapted without any evidence of dehiscence and a scar has formed and the incision is well healed.    Today the wound measures 1.5 x 0.5 cm and is superficial with a granular wound base.  Overall the wound is about the same as last appointment.  Is superficial however with a granular base.  There is no surrounding erythema, ascending cellulitis.  There is no fluctuation crepitation any malodor.  No pain with calf compression, swelling, warmth, erythema.   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  I did sharply debride the wound today utilizing a tissue nipper as well as a 312 with scalpel down to healthy, bleeding, granular tissue.  Continue with daily dressing changes. -Continue cam boot at all times encouraged elevation and limit activity. -Monitor for any clinical signs or symptoms of infection and directed to call the office immediately should any occur or go to the ER. -Follow-up as scheduled or sooner if needed.  Trula Slade DPM

## 2018-07-05 ENCOUNTER — Ambulatory Visit (INDEPENDENT_AMBULATORY_CARE_PROVIDER_SITE_OTHER): Payer: Medicare Other | Admitting: Podiatry

## 2018-07-05 ENCOUNTER — Encounter: Payer: Self-pay | Admitting: Podiatry

## 2018-07-05 ENCOUNTER — Other Ambulatory Visit: Payer: Self-pay | Admitting: Podiatry

## 2018-07-05 ENCOUNTER — Ambulatory Visit (INDEPENDENT_AMBULATORY_CARE_PROVIDER_SITE_OTHER): Payer: Medicare Other

## 2018-07-05 DIAGNOSIS — M86072 Acute hematogenous osteomyelitis, left ankle and foot: Secondary | ICD-10-CM | POA: Diagnosis not present

## 2018-07-05 DIAGNOSIS — L97521 Non-pressure chronic ulcer of other part of left foot limited to breakdown of skin: Secondary | ICD-10-CM | POA: Diagnosis not present

## 2018-07-05 DIAGNOSIS — M79671 Pain in right foot: Secondary | ICD-10-CM

## 2018-07-05 DIAGNOSIS — M21611 Bunion of right foot: Secondary | ICD-10-CM | POA: Diagnosis not present

## 2018-07-05 DIAGNOSIS — M86071 Acute hematogenous osteomyelitis, right ankle and foot: Secondary | ICD-10-CM

## 2018-07-05 DIAGNOSIS — M14672 Charcot's joint, left ankle and foot: Secondary | ICD-10-CM | POA: Diagnosis not present

## 2018-07-08 DIAGNOSIS — E113313 Type 2 diabetes mellitus with moderate nonproliferative diabetic retinopathy with macular edema, bilateral: Secondary | ICD-10-CM | POA: Diagnosis not present

## 2018-07-10 ENCOUNTER — Telehealth: Payer: Self-pay | Admitting: Podiatry

## 2018-07-10 DIAGNOSIS — I10 Essential (primary) hypertension: Secondary | ICD-10-CM | POA: Diagnosis not present

## 2018-07-10 DIAGNOSIS — M86172 Other acute osteomyelitis, left ankle and foot: Secondary | ICD-10-CM | POA: Diagnosis not present

## 2018-07-10 DIAGNOSIS — L97421 Non-pressure chronic ulcer of left heel and midfoot limited to breakdown of skin: Secondary | ICD-10-CM | POA: Diagnosis not present

## 2018-07-10 DIAGNOSIS — D649 Anemia, unspecified: Secondary | ICD-10-CM | POA: Diagnosis not present

## 2018-07-10 DIAGNOSIS — E11621 Type 2 diabetes mellitus with foot ulcer: Secondary | ICD-10-CM | POA: Diagnosis not present

## 2018-07-10 DIAGNOSIS — L97522 Non-pressure chronic ulcer of other part of left foot with fat layer exposed: Secondary | ICD-10-CM | POA: Diagnosis not present

## 2018-07-10 NOTE — Telephone Encounter (Signed)
Pt called with questions about MRI. Stated she received a call that she was to go to [place] in [place] but pt is wanting her MRI done around here.

## 2018-07-10 NOTE — Telephone Encounter (Signed)
Left message informing pt her last MRI was 03/2018, I would send to [facility], where Dr. Jacqualyn Posey had referred her to [facility] - Dr. Donnie Mesa for the charcot foot. Faxed referral with Addendum: MRI of 03/12/2018.

## 2018-07-11 NOTE — Telephone Encounter (Signed)
I called states she saw doctor at [facility] and she was not impressed with that doctor and she was not going back. Pt states she spoke with Dr. Jacqualyn Posey on Friday and he said he would order the MRI. I told pt I would send a message to DR. Wagoner as a reminder to give me orders for the MRI.

## 2018-07-11 NOTE — Telephone Encounter (Signed)
Pt called requesting that MRI be sent to a local office in [place]. Pt does not want to go to [facility] for the MRI.

## 2018-07-12 ENCOUNTER — Other Ambulatory Visit: Payer: Self-pay | Admitting: Podiatry

## 2018-07-12 DIAGNOSIS — M86072 Acute hematogenous osteomyelitis, left ankle and foot: Secondary | ICD-10-CM | POA: Diagnosis not present

## 2018-07-13 LAB — CBC WITH DIFFERENTIAL/PLATELET
Basophils Absolute: 0.1 10*3/uL (ref 0.0–0.2)
Basos: 1 %
EOS (ABSOLUTE): 0.3 10*3/uL (ref 0.0–0.4)
Eos: 3 %
Hematocrit: 41.7 % (ref 34.0–46.6)
Hemoglobin: 12.8 g/dL (ref 11.1–15.9)
Immature Grans (Abs): 0 10*3/uL (ref 0.0–0.1)
Immature Granulocytes: 0 %
Lymphocytes Absolute: 3.3 10*3/uL — ABNORMAL HIGH (ref 0.7–3.1)
Lymphs: 34 %
MCH: 23.7 pg — AB (ref 26.6–33.0)
MCHC: 30.7 g/dL — ABNORMAL LOW (ref 31.5–35.7)
MCV: 77 fL — ABNORMAL LOW (ref 79–97)
Monocytes Absolute: 0.8 10*3/uL (ref 0.1–0.9)
Monocytes: 8 %
Neutrophils Absolute: 5.1 10*3/uL (ref 1.4–7.0)
Neutrophils: 54 %
Platelets: 434 10*3/uL (ref 150–450)
RBC: 5.39 x10E6/uL — ABNORMAL HIGH (ref 3.77–5.28)
RDW: 15 % (ref 11.7–15.4)
WBC: 9.7 10*3/uL (ref 3.4–10.8)

## 2018-07-13 LAB — C-REACTIVE PROTEIN: CRP: 6 mg/L (ref 0–10)

## 2018-07-13 LAB — SEDIMENTATION RATE: SED RATE: 30 mm/h (ref 0–40)

## 2018-07-14 NOTE — Telephone Encounter (Signed)
We talked about an MRI but was ordering blood work first. Once I get that back we can see. They were ordering the MRI because they wanted to check for infection prior to any surgery. I would like to get the blood work first.

## 2018-07-15 ENCOUNTER — Telehealth: Payer: Self-pay | Admitting: Podiatry

## 2018-07-15 NOTE — Telephone Encounter (Signed)
I informed pt of Dr. Leigh Aurora recommendations of 07/14/2018 5:17pm. Pt states she had lab at [facility], and will have them fax to our office.

## 2018-07-15 NOTE — Telephone Encounter (Signed)
Pt returned call and is coming in about 1245 to see Liliane Channel

## 2018-07-15 NOTE — Telephone Encounter (Signed)
Left message for pt to call to come in earlier for her appt tomorrow if possible to see Liliane Channel so he can rescan her for the crow walker that did not fit. I have put her on at 100pm and she is scheduled to see Dr Viona Gilmore at 115.

## 2018-07-15 NOTE — Telephone Encounter (Signed)
Patient had blood work done at [facility] and this is the 2nd time. He will need to go into another portal to be able to actually pull the blood work. Please call patient.

## 2018-07-15 NOTE — Progress Notes (Signed)
  Subjective: Olivia Romero is a 63 y.o. is seen today for follow-up evaluation of a wound to the left foot.  Overall she states the wound is doing better.  She denies any drainage or pus coming from the area she denies any pain swelling or redness.  She has new concerns today about a bump on her right foot that she just noticed.  She points on the medial first MPJ on the bunion area.  She states is been somewhat red she just noticed this.  Denies any skin breakdown.  This I last saw her she did follow-up with [facility] and she states that she did not want to go back to see them again.   She has no other  new concerns and she denies any fevers, chills, nausea, vomiting.  She denies any calf pain, chest pain, shortness of breath.   Objective: General: No acute distress, AAOx3  DP/PT pulses palpable 2/4, CRT < 3 sec to all digits.  LEFT foot: Incision is well coapted without any evidence of dehiscence and a scar has formed and the incision is well healed.    Today the wound measures 1 x 0.5 cm and is superficial with a granular wound base.  Overall the wound is about the same as last appointment.  It is superficial however with a granular base.  There is no surrounding erythema, ascending cellulitis.  There is no fluctuation crepitation any malodor.  RIGHT: Bunion deformities present on the right foot.  There is mild erythema from irritation there is no skin breakdown.  No increase in warmth.  No other areas of skin breakdown. No pain with calf compression, swelling, warmth, erythema.   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  I did sharply debride the wound today utilizing a tissue nipper as well as a 312 with scalpel down to healthy, bleeding, granular tissue.  Continue with daily dressing changes. -Continue cam boot  at all times encouraged elevation and limit activity.  We did discussion regards to surgery on her left ankle.  She is very hesitant to have any surgery to reconstruct her ankle or foot.  She wants to try to continue conservative care for now. I will follow up with the [name] boot.  She will try this before proceeding with surgery.  We will currently check blood work today including CBC, ESR, CRP.  Consider new MRI as well if blood work is elevated. -X-rays were obtained and reviewed of the right foot.  Bunion is present.  There is no acute osteomyelitis identified the sites of erythema. -Monitor for any clinical signs or symptoms of infection and directed to call the office immediately should any occur or go to the ER. -Follow-up as scheduled or sooner if needed.  Trula Slade DPM

## 2018-07-16 ENCOUNTER — Ambulatory Visit: Payer: Medicare Other | Admitting: Orthotics

## 2018-07-16 ENCOUNTER — Ambulatory Visit (INDEPENDENT_AMBULATORY_CARE_PROVIDER_SITE_OTHER): Payer: Medicare Other | Admitting: Podiatry

## 2018-07-16 ENCOUNTER — Encounter: Payer: Self-pay | Admitting: Podiatry

## 2018-07-16 VITALS — Temp 99.0°F

## 2018-07-16 DIAGNOSIS — M21611 Bunion of right foot: Secondary | ICD-10-CM

## 2018-07-16 DIAGNOSIS — L97521 Non-pressure chronic ulcer of other part of left foot limited to breakdown of skin: Secondary | ICD-10-CM | POA: Diagnosis not present

## 2018-07-16 DIAGNOSIS — M14672 Charcot's joint, left ankle and foot: Secondary | ICD-10-CM

## 2018-07-16 DIAGNOSIS — M86072 Acute hematogenous osteomyelitis, left ankle and foot: Secondary | ICD-10-CM

## 2018-07-16 DIAGNOSIS — M21542 Acquired clubfoot, left foot: Secondary | ICD-10-CM

## 2018-07-16 NOTE — Progress Notes (Signed)
Scanned patient for crow walker, [place] brace.

## 2018-07-16 NOTE — Telephone Encounter (Signed)
Patient has an appointment today with Dr Jacqualyn Posey. Olivia Romero

## 2018-07-18 NOTE — Progress Notes (Signed)
  Subjective: Olivia Romero is a 63 y.o. is seen today for follow-up evaluation of a wound to the left foot. She presents today for evaluation of a wound on the left foot.  She also presents today to see Liliane Channel as she got measured for a CRO walker.  She feels that the wound is getting better and she denies any drainage or pus coming from the area.  She states that she has been tries the off of her foot as much as possible and the swelling is much improved.  Is also been wrapping Ace bandage on the foot going up the leg to help with the swelling.  She denies any fevers, chills, nausea, vomiting.  Denies any calf pain, chest pain, shortness of breath.  She has no other concerns today.  Objective: General: No acute distress, AAOx3  DP/PT pulses palpable 2/4, CRT < 3 sec to all digits.  LEFT foot: Incision is well coapted without any evidence of dehiscence and a scar has formed and the incision is well healed.    Today the wound measures 1.1 x 0.4 cm and is superficial with a granular wound base.  The wound was slightly larger today as she did peel a small piece of skin off accidentally.  There was some bleeding that she noticed but otherwise areas not actively bleeding there is no signs of infection.  There is no surrounding erythema, ascending cellulitis.  There is no fluctuation or crepitation malodor.  Cavovarus deformity is present.  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  I did sharply debride the wound today utilizing a tissue nipper as well as a 312 with scalpel down to healthy, bleeding, granular tissue.  Continue with daily dressing changes.Continue with medihoney dressing changes daily. -Blood work was reviewed with her today.  Sed rate, CRP, WBC all within normal limits.  I doubt acute osteomyelitis. -Continue in the CAM boot for  now and awaiting the [place] put a rush on this.  -She was to hold off any surgical intervention for reconstruction at this time. -Monitor for any clinical signs or symptoms of infection and directed to call the office immediately should any occur or go to the ER.  Return in about 10 days (around 07/26/2018).  Trula Slade DPM

## 2018-07-24 ENCOUNTER — Ambulatory Visit (INDEPENDENT_AMBULATORY_CARE_PROVIDER_SITE_OTHER): Payer: Medicare Other | Admitting: Podiatry

## 2018-07-24 ENCOUNTER — Encounter: Payer: Self-pay | Admitting: Podiatry

## 2018-07-24 VITALS — BP 136/65 | HR 89 | Temp 99.7°F | Resp 14

## 2018-07-24 DIAGNOSIS — L97521 Non-pressure chronic ulcer of other part of left foot limited to breakdown of skin: Secondary | ICD-10-CM

## 2018-07-24 DIAGNOSIS — M14672 Charcot's joint, left ankle and foot: Secondary | ICD-10-CM

## 2018-07-26 ENCOUNTER — Encounter: Payer: Self-pay | Admitting: Family Medicine

## 2018-07-26 ENCOUNTER — Ambulatory Visit (INDEPENDENT_AMBULATORY_CARE_PROVIDER_SITE_OTHER): Payer: Medicare Other | Admitting: Family Medicine

## 2018-07-26 VITALS — BP 110/64 | HR 99 | Temp 98.9°F | Resp 14 | Ht 62.0 in | Wt 183.6 lb

## 2018-07-26 DIAGNOSIS — E1121 Type 2 diabetes mellitus with diabetic nephropathy: Secondary | ICD-10-CM | POA: Diagnosis not present

## 2018-07-26 DIAGNOSIS — R509 Fever, unspecified: Secondary | ICD-10-CM | POA: Diagnosis not present

## 2018-07-26 DIAGNOSIS — J0110 Acute frontal sinusitis, unspecified: Secondary | ICD-10-CM | POA: Diagnosis not present

## 2018-07-26 LAB — POCT INFLUENZA A/B
Influenza A, POC: NEGATIVE
Influenza B, POC: NEGATIVE

## 2018-07-26 MED ORDER — AMOXICILLIN-POT CLAVULANATE 875-125 MG PO TABS: 1.0000 | ORAL_TABLET | Freq: Two times a day (BID) | ORAL | 0 refills | Status: AC

## 2018-07-26 NOTE — Progress Notes (Signed)
BP 110/64   Pulse 99   Temp 98.9 F (37.2 C) (Oral)   Resp 14   Ht 5\' 2"  (1.575 m)   Wt 183 lb 9.6 oz (83.3 kg)   SpO2 96%   BMI 33.58 kg/m    Subjective:    Patient ID: Olivia Romero, female    DOB: [birth year], 63 y.o.   MRN: [id number]  HPI: Olivia Romero is a 63 y.o. female  Chief Complaint  Patient presents with  . URI    symptoms include headache, side neck pain and drainage    HPI Patient is here for a sick visit; having so much drainage from her nose, down the back of the throat, so thick, trouble breathing from all the drainage; no recent anbiotics; last antibiotics were IV; no body aches like flu; no rash; little bit of sore throat from the drainage; sore along the face, facial pressure and headaches  She has been battling osteomyelitis; she saw her foot doctor two days ago, dripping with drainage after he applied some silver material; really drawing stuff out she says; she told him this happened one other time; took so long to be able to shower after she had her surgery; Dr. Leigh Aurora note reviewed from 07/24/2018  She has type 2 diabetes Lab Results  Component Value Date   HGBA1C 7.0 05/08/2018     Depression screen [facility] 2/9 07/26/2018 04/16/2018 11/19/2017 01/31/2017 01/19/2017  Decreased Interest 0 0 0 0 0  Down, Depressed, Hopeless 0 0 0 0 0  PHQ - 2 Score 0 0 0 0 0  Altered sleeping 0 - 0 - -  Tired, decreased energy 0 - 0 - -  Change in appetite 0 - 0 - -  Feeling bad or failure about yourself  0 - 0 - -  Trouble concentrating 0 - 0 - -  Moving slowly or fidgety/restless 0 - 0 - -  Suicidal thoughts 0 - 0 - -  PHQ-9 Score 0 - 0 - -  Difficult doing work/chores Not difficult at all - Not difficult at all - -   Fall Risk  07/26/2018 06/03/2018 04/16/2018 03/21/2018 03/11/2018  Falls in the past year? 0 0 0 No No  Number falls in past yr: 0 0 0 - -  Injury with Fall? 0 0 1 - -  Follow up - - - - -    Relevant past medical, surgical, family and  social history reviewed Past Medical History:  Diagnosis Date  . Anemia   . Arthritis   . Asthma   . Carpal tunnel syndrome on right   . Colon polyps    adenomatous  . Diabetes mellitus without complication ([place])   . Diverticulosis of colon   . Esophagitis   . GERD (gastroesophageal reflux disease)   . Hemorrhoid    internal  . Hyperlipemia   . Hypertension   . Neuropathy   . Pneumonia   . PONV (postoperative nausea and vomiting)   . Sleep apnea    has had in the past lost 50 pounds and do longer uses cpap  . Stroke-like episode ([place]) 2009   TIA   Past Surgical History:  Procedure Laterality Date  . ABDOMINAL HYSTERECTOMY    . BACK SURGERY  09/02/2015  . CERVICAL FUSION    . CESAREAN SECTION  1986  . COLONOSCOPY N/A 11/30/2012   Procedure: COLONOSCOPY;  Surgeon: Irene Shipper, MD;  Location: [facility] ENDOSCOPY;  Service: Endoscopy;  Laterality:  N/A;  . COLONOSCOPY WITH PROPOFOL N/A 01/03/2016   Procedure: COLONOSCOPY WITH PROPOFOL;  Surgeon: Manya Silvas, MD;  Location: [facility] ENDOSCOPY;  Service: Endoscopy;  Laterality: N/A;  . ESOPHAGOGASTRODUODENOSCOPY (EGD) WITH PROPOFOL N/A 01/14/2015   Procedure: ESOPHAGOGASTRODUODENOSCOPY (EGD) WITH PROPOFOL;  Surgeon: Manya Silvas, MD;  Location[facility] ENDOSCOPY;  Service: Endoscopy;  Laterality: N/A;  . IRRIGATION AND DEBRIDEMENT FOOT Left 03/13/2018   Procedure: IRRIGATION AND DEBRIDEMENT FOOT WITH BONE BIOPSY WITH MISONIX DEBRIDER;  Surgeon: Evelina Bucy, DPM;  Location: [place];  Service: Podiatry;  Laterality: Left;  . LUMBAR WOUND DEBRIDEMENT N/A 10/01/2015   Procedure: LUMBAR WOUND DEBRIDEMENT;  Surgeon: Ashok Pall, MD;  Location: [place] NEURO ORS;  Service: Neurosurgery;  Laterality: N/A;  LUMBAR WOUND DEBRIDEMENT  . SAVORY DILATION N/A 01/14/2015   Procedure: SAVORY DILATION;  Surgeon: Manya Silvas, MD;  Location: [facility] ENDOSCOPY;  Service: Endoscopy;  Laterality: N/A;  . TONSILLECTOMY     Family History  Problem Relation Age  of Onset  . Lung cancer Father   . Hypertension Father   . Arthritis Father   . Other Mother        hardening of the arteries/renal cell carcenoma  . Hypertension Mother   . Stroke Mother   . Kidney cancer Mother   . Arthritis Brother   . Rheum arthritis Maternal Uncle   . Bladder Cancer Neg Hx    Social History   Tobacco Use  . Smoking status: Never Smoker  . Smokeless tobacco: Never Used  Substance Use Topics  . Alcohol use: Never    Frequency: Never  . Drug use: No     Office Visit from 07/26/2018 in [facility]  AUDIT-C Score  0      Interim medical history since last visit reviewed. Allergies and medications reviewed  Review of Systems Per HPI unless specifically indicated above     Objective:    BP 110/64   Pulse 99   Temp 98.9 F (37.2 C) (Oral)   Resp 14   Ht 5\' 2"  (1.575 m)   Wt 183 lb 9.6 oz (83.3 kg)   SpO2 96%   BMI 33.58 kg/m   Wt Readings from Last 3 Encounters:  07/26/18 183 lb 9.6 oz (83.3 kg)  06/03/18 185 lb (83.9 kg)  04/16/18 189 lb (85.7 kg)    Physical Exam Constitutional:      Appearance: She is well-developed. She is obese.  HENT:     Head:     Comments: Tender to palpation over paranasal sinuses, left > right    Nose: Rhinorrhea present.     Mouth/Throat:     Pharynx: No oropharyngeal exudate or posterior oropharyngeal erythema.  Eyes:     General: No scleral icterus. Cardiovascular:     Rate and Rhythm: Normal rate and regular rhythm.  Pulmonary:     Effort: Pulmonary effort is normal.     Breath sounds: Normal breath sounds.  Lymphadenopathy:     Cervical: No cervical adenopathy.  Neurological:     Mental Status: She is alert.  Psychiatric:        Behavior: Behavior normal.    Diabetic Foot Form - Detailed   Diabetic Foot Exam - detailed Diabetic Foot exam was performed with the following findings:  Yes 07/26/2018  8:42 PM  Visual Foot Exam completed.:  Yes  Pulse Foot Exam completed.:  Yes    Right Dorsalis Pedis:  Present   Sensory Foot Exam Completed.:  Yes Semmes-Weinstein Monofilament Test R Site 1-Great Toe:  Pos         Results for orders placed or performed in visit on 07/26/18  POCT Influenza A/B  Result Value Ref Range   Influenza A, POC Negative Negative   Influenza B, POC Negative Negative      Assessment & Plan:   Problem List Items Addressed This Visit      Endocrine   Type 2 diabetes mellitus with diabetic nephropathy ([place])    Right foot exam done today, left foot in boot, monitored by podiatrist       Other Visit Diagnoses    Fever, unspecified fever cause    -  Primary   flu tests negative today; suspect sinusitis; antitbiotics; call if worsening; C diff precautions   Relevant Orders   POCT Influenza A/B (Completed)   Acute non-recurrent frontal sinusitis       antibiotics, C diff precautions, call if worsening   Relevant Medications   amoxicillin-clavulanate (AUGMENTIN) 875-125 MG tablet       Follow up plan: No follow-ups on file.  An after-visit summary was printed and given to the patient at [place].  Please see the patient instructions which may contain other information and recommendations beyond what is mentioned above in the assessment and plan.  Meds ordered this encounter  Medications  . amoxicillin-clavulanate (AUGMENTIN) 875-125 MG tablet    Sig: Take 1 tablet by mouth 2 (two) times daily for 10 days.    Dispense:  20 tablet    Refill:  0    Orders Placed This Encounter  Procedures  . POCT Influenza A/B

## 2018-07-26 NOTE — Patient Instructions (Addendum)
Please do eat yogurt or kimchi or take a probiotic daily for the next month We want to replace the healthy germs in the gut If you notice foul, watery diarrhea in the next two months, schedule an appointment RIGHT AWAY or go to an urgent care or the emergency room if a holiday or over a weekend  Take antibiotics Call over the weekend to the on-call staff if getting worse or anything worrisome, or go to the hospital for anything worrisome  Try vitamin C (orange juice if not diabetic or vitamin C tablets) and drink green tea to help your immune system during your illness Get plenty of rest and hydration  Check out the information at familydoctor.org entitled "Nutrition for Weight Loss: What You Need to Know about Fad Diets" Try to lose between 1-2 pounds per week by taking in fewer calories and burning off more calories You can succeed by limiting portions, limiting foods dense in calories and fat, becoming more active, and drinking 8 glasses of water a day (64 ounces) Don't skip meals, especially breakfast, as skipping meals may alter your metabolism Do not use over-the-counter weight loss pills or gimmicks that claim rapid weight loss A healthy BMI (or body mass index) is between 18.5 and 24.9 You can calculate your ideal BMI at the [employer] website ClubMonetize.fr   Obesity, Adult Obesity is the condition of having too much total body fat. Being overweight or obese means that your weight is greater than what is considered healthy for your body size. Obesity is determined by a measurement called BMI. BMI is an estimate of body fat and is calculated from height and weight. For adults, a BMI of 30 or higher is considered obese. Obesity can eventually lead to other health concerns and major illnesses, including:  Stroke.  Coronary artery disease (CAD).  Type 2 diabetes.  Some types of cancer, including cancers of the colon, breast, uterus, and  gallbladder.  Osteoarthritis.  High blood pressure (hypertension).  High cholesterol.  Sleep apnea.  Gallbladder stones.  Infertility problems. What are the causes? The main cause of obesity is taking in (consuming) more calories than your body uses for energy. Other factors that contribute to this condition may include:  Being born with genes that make you more likely to become obese.  Having a medical condition that causes obesity. These conditions include: ? Hypothyroidism. ? Polycystic ovarian syndrome (PCOS). ? Binge-eating disorder. ? Cushing syndrome.  Taking certain medicines, such as steroids, antidepressants, and seizure medicines.  Not being physically active (sedentary lifestyle).  Living where there are limited places to exercise safely or buy healthy foods.  Not getting enough sleep. What increases the risk? The following factors may increase your risk of this condition:  Having a family history of obesity.  Being a woman of African-American descent.  Being a man of Hispanic descent. What are the signs or symptoms? Having excessive body fat is the main symptom of this condition. How is this diagnosed? This condition may be diagnosed based on:  Your symptoms.  Your medical history.  A physical exam. Your health care provider may measure: ? Your BMI. If you are an adult with a BMI between 25 and less than 30, you are considered overweight. If you are an adult with a BMI of 30 or higher, you are considered obese. ? The distances around your hips and your waist (circumferences). These may be compared to each other to help diagnose your condition. ? Your skinfold thickness. Your health  care provider may gently pinch a fold of your skin and measure it. How is this treated? Treatment for this condition often includes changing your lifestyle. Treatment may include some or all of the following:  Dietary changes. Work with your health care provider and a  dietitian to set a weight-loss goal that is healthy and reasonable for you. Dietary changes may include eating: ? Smaller portions. A portion size is the amount of a particular food that is healthy for you to eat at one time. This varies from person to person. ? Low-calorie or low-fat options. ? More whole grains, fruits, and vegetables.  Regular physical activity. This may include aerobic activity (cardio) and strength training.  Medicine to help you lose weight. Your health care provider may prescribe medicine if you are unable to lose 1 pound a week after 6 weeks of eating more healthily and doing more physical activity.  Surgery. Surgical options may include gastric banding and gastric bypass. Surgery may be done if: ? Other treatments have not helped to improve your condition. ? You have a BMI of 40 or higher. ? You have life-threatening health problems related to obesity. Follow these instructions at home:  Eating and drinking   Follow recommendations from your health care provider about what you eat and drink. Your health care provider may advise you to: ? Limit fast foods, sweets, and processed snack foods. ? Choose low-fat options, such as low-fat milk instead of whole milk. ? Eat 5 or more servings of fruits or vegetables every day. ? Eat at home more often. This gives you more control over what you eat. ? Choose healthy foods when you eat out. ? Learn what a healthy portion size is. ? Keep low-fat snacks on hand. ? Avoid sugary drinks, such as soda, fruit juice, iced tea sweetened with sugar, and flavored milk. ? Eat a healthy breakfast.  Drink enough water to keep your urine clear or pale yellow.  Do not go without eating for long periods of time (do not fast) or follow a fad diet. Fasting and fad diets can be unhealthy and even dangerous. Physical Activity  Exercise regularly, as told by your health care provider. Ask your health care provider what types of exercise are  safe for you and how often you should exercise.  Warm up and stretch before being active.  Cool down and stretch after being active.  Rest between periods of activity. Lifestyle  Limit the time that you spend in front of your TV, computer, or video game system.  Find ways to reward yourself that do not involve food.  Limit alcohol intake to no more than 1 drink a day for nonpregnant women and 2 drinks a day for men. One drink equals 12 oz of beer, 5 oz of wine, or 1 oz of hard liquor. General instructions  Keep a weight loss journal to keep track of the food you eat and how much you exercise you get.  Take over-the-counter and prescription medicines only as told by your health care provider.  Take vitamins and supplements only as told by your health care provider.  Consider joining a support group. Your health care provider may be able to recommend a support group.  Keep all follow-up visits as told by your health care provider. This is important. Contact a health care provider if:  You are unable to meet your weight loss goal after 6 weeks of dietary and lifestyle changes. This information is not intended to replace  advice given to you by your health care provider. Make sure you discuss any questions you have with your health care provider. Document Released: 06/29/2004 Document Revised: 10/25/2015 Document Reviewed: 03/10/2015 Elsevier Interactive Patient Education  2019 Else[employer] Inc.  Preventing Unhealthy [employer], Adult Staying at a healthy weight is important to your overall health. When fat builds up in your body, you may become overweight or obese. Being overweight or obese increases your risk of developing certain health problems, such as heart disease, diabetes, sleeping problems, joint problems, and some types of cancer. Unhealthy weight gain is often the result of making unhealthy food choices or not getting enough exercise. You can make changes to your lifestyle to  prevent obesity and stay as healthy as possible. What nutrition changes can be made?   Eat only as much as your body needs. To do this: ? Pay attention to signs that you are hungry or full. Stop eating as soon as you feel full. ? If you feel hungry, try drinking water first before eating. Drink enough water so your urine is clear or pale yellow. ? Eat smaller portions. Pay attention to portion sizes when eating out. ? Look at serving sizes on food labels. Most foods contain more than one serving per container. ? Eat the recommended number of calories for your gender and activity level. For most active people, a daily total of 2,000 calories is appropriate. If you are trying to lose weight or are not very active, you may need to eat fewer calories. Talk with your health care provider or a diet and nutrition specialist (dietitian) about how many calories you need each day.  Choose healthy foods, such as: ? Fruits and vegetables. At each meal, try to fill at least half of your plate with fruits and vegetables. ? Whole grains, such as whole-wheat bread, brown rice, and quinoa. ? Lean meats, such as chicken or fish. ? Other healthy proteins, such as beans, eggs, or tofu. ? Healthy fats, such as nuts, seeds, fatty fish, and olive oil. ? Low-fat or fat-free dairy products.  Check food labels, and avoid food and drinks that: ? Are high in calories. ? Have added sugar. ? Are high in sodium. ? Have saturated fats or trans fats.  Cook foods in healthier ways, such as by baking, broiling, or grilling.  Make a meal plan for the week, and shop with a grocery list to help you stay on track with your purchases. Try to avoid going to the grocery store when you are hungry.  When grocery shopping, try to shop around the outside of the store first, where the fresh foods are. Doing this helps you to avoid prepackaged foods, which can be high in sugar, salt (sodium), and fat. What lifestyle changes can be  made?   Exercise for 30 or more minutes on 5 or more days each week. Exercising may include brisk walking, yard work, biking, running, swimming, and team sports like basketball and soccer. Ask your health care provider which exercises are safe for you.  Do muscle-strengthening activities, such as lifting weights or using resistance bands, on 2 or more days a week.  Do not use any products that contain nicotine or tobacco, such as cigarettes and e-cigarettes. If you need help quitting, ask your health care provider.  Limit alcohol intake to no more than 1 drink a day for nonpregnant women and 2 drinks a day for men. One drink equals 12 oz of beer, 5 oz of wine,  or 1 oz of hard liquor.  Try to get 7-9 hours of sleep each night. What other changes can be made?  Keep a food and activity journal to keep track of: ? What you ate and how many calories you had. Remember to count the calories in sauces, dressings, and side dishes. ? Whether you were active, and what exercises you did. ? Your calorie, weight, and activity goals.  Check your weight regularly. Track any changes. If you notice you have gained weight, make changes to your diet or activity routine.  Avoid taking weight-loss medicines or supplements. Talk to your health care provider before starting any new medicine or supplement.  Talk to your health care provider before trying any new diet or exercise plan. Why are these changes important? Eating healthy, staying active, and having healthy habits can help you to prevent obesity. Those changes also:  Help you manage stress and emotions.  Help you connect with friends and family.  Improve your self-esteem.  Improve your sleep.  Prevent long-term health problems. What can happen if changes are not made? Being obese or overweight can cause you to develop joint or bone problems, which can make it hard for you to stay active or do activities you enjoy. Being obese or overweight  also puts stress on your heart and lungs and can lead to health problems like diabetes, heart disease, and some cancers. Where to find more information Talk with your health care provider or a dietitian about healthy eating and healthy lifestyle choices. You may also find information from:  [place]. Department of Agriculture, MyPlate: FormerBoss.no  American Heart Association: www.heart.org  Centers for Disease Control and Prevention: http://www.wolf.info/ Summary  Staying at a healthy weight is important to your overall health. It helps you to prevent certain diseases and health problems, such as heart disease, diabetes, joint problems, sleep disorders, and some types of cancer.  Being obese or overweight can cause you to develop joint or bone problems, which can make it hard for you to stay active or do activities you enjoy.  You can prevent unhealthy weight gain by eating a healthy diet, exercising regularly, not smoking, limiting alcohol, and getting enough sleep.  Talk with your health care provider or a dietitian for guidance about healthy eating and healthy lifestyle choices. This information is not intended to replace advice given to you by your health care provider. Make sure you discuss any questions you have with your health care provider. Document Released: 05/23/2016 Document Revised: 03/02/2017 Document Reviewed: 06/28/2016 Elsevier Interactive Patient Education  2019 Elsevier Inc.  Sinusitis, Adult Sinusitis is soreness and swelling (inflammation) of your sinuses. Sinuses are hollow spaces in the bones around your face. They are located:  Around your eyes.  In the middle of your forehead.  Behind your nose.  In your cheekbones. Your sinuses and nasal passages are lined with a fluid called mucus. Mucus drains out of your sinuses. Swelling can trap mucus in your sinuses. This lets germs (bacteria, virus, or fungus) grow, which leads to infection. Most of the time, this  condition is caused by a virus. What are the causes? This condition is caused by:  Allergies.  Asthma.  Germs.  Things that block your nose or sinuses.  Growths in the nose (nasal polyps).  Chemicals or irritants in the air.  Fungus (rare). What increases the risk? You are more likely to develop this condition if:  You have a weak body defense system (immune system).  You do  a lot of swimming or diving.  You use nasal sprays too much.  You smoke. What are the signs or symptoms? The main symptoms of this condition are pain and a feeling of pressure around the sinuses. Other symptoms include:  Stuffy nose (congestion).  Runny nose (drainage).  Swelling and warmth in the sinuses.  Headache.  Toothache.  A cough that may get worse at night.  Mucus that collects in the throat or the back of the nose (postnasal drip).  Being unable to smell and taste.  Being very tired (fatigue).  A fever.  Sore throat.  Bad breath. How is this diagnosed? This condition is diagnosed based on:  Your symptoms.  Your medical history.  A physical exam.  Tests to find out if your condition is short-term (acute) or long-term (chronic). Your doctor may: ? Check your nose for growths (polyps). ? Check your sinuses using a tool that has a light (endoscope). ? Check for allergies or germs. ? Do imaging tests, such as an MRI or CT scan. How is this treated? Treatment for this condition depends on the cause and whether it is short-term or long-term.  If caused by a virus, your symptoms should go away on their own within 10 days. You may be given medicines to relieve symptoms. They include: ? Medicines that shrink swollen tissue in the nose. ? Medicines that treat allergies (antihistamines). ? A spray that treats swelling of the nostrils. ? Rinses that help get rid of thick mucus in your nose (nasal saline washes).  If caused by bacteria, your doctor may wait to see if you  will get better without treatment. You may be given antibiotic medicine if you have: ? A very bad infection. ? A weak body defense system.  If caused by growths in the nose, you may need to have surgery. Follow these instructions at home: Medicines  Take, use, or apply over-the-counter and prescription medicines only as told by your doctor. These may include nasal sprays.  If you were prescribed an antibiotic medicine, take it as told by your doctor. Do not stop taking the antibiotic even if you start to feel better. Hydrate and humidify   Drink enough water to keep your pee (urine) pale yellow.  Use a cool mist humidifier to keep the humidity level in your home above 50%.  Breathe in steam for 10-15 minutes, 3-4 times a day, or as told by your doctor. You can do this in the bathroom while a hot shower is running.  Try not to spend time in cool or dry air. Rest  Rest as much as you can.  Sleep with your head raised (elevated).  Make sure you get enough sleep each night. General instructions   Put a warm, moist washcloth on your face 3-4 times a day, or as often as told by your doctor. This will help with discomfort.  Wash your hands often with soap and water. If there is no soap and water, use hand sanitizer.  Do not smoke. Avoid being around people who are smoking (secondhand smoke).  Keep all follow-up visits as told by your doctor. This is important. Contact a doctor if:  You have a fever.  Your symptoms get worse.  Your symptoms do not get better within 10 days. Get help right away if:  You have a very bad headache.  You cannot stop throwing up (vomiting).  You have very bad pain or swelling around your face or eyes.  You have  trouble seeing.  You feel confused.  Your neck is stiff.  You have trouble breathing. Summary  Sinusitis is swelling of your sinuses. Sinuses are hollow spaces in the bones around your face.  This condition is caused by  tissues in your nose that become inflamed or swollen. This traps germs. These can lead to infection.  If you were prescribed an antibiotic medicine, take it as told by your doctor. Do not stop taking it even if you start to feel better.  Keep all follow-up visits as told by your doctor. This is important. This information is not intended to replace advice given to you by your health care provider. Make sure you discuss any questions you have with your health care provider. Document Released: 11/08/2007 Document Revised: 10/22/2017 Document Reviewed: 10/22/2017 Elsevier Interactive Patient Education  2019 [employer].

## 2018-07-29 ENCOUNTER — Encounter: Payer: Self-pay | Admitting: Podiatry

## 2018-07-29 ENCOUNTER — Ambulatory Visit: Payer: Medicare Other | Admitting: Podiatry

## 2018-07-29 ENCOUNTER — Ambulatory Visit (INDEPENDENT_AMBULATORY_CARE_PROVIDER_SITE_OTHER): Payer: Medicare Other | Admitting: Podiatry

## 2018-07-29 VITALS — BP 104/64 | HR 81 | Temp 99.4°F | Resp 14

## 2018-07-29 DIAGNOSIS — L97521 Non-pressure chronic ulcer of other part of left foot limited to breakdown of skin: Secondary | ICD-10-CM | POA: Diagnosis not present

## 2018-07-29 DIAGNOSIS — M14672 Charcot's joint, left ankle and foot: Secondary | ICD-10-CM

## 2018-07-29 NOTE — Assessment & Plan Note (Signed)
Right foot exam done today, left foot in boot, monitored by podiatrist

## 2018-07-31 NOTE — Progress Notes (Signed)
  Subjective: Olivia Romero is a 63 y.o. is seen today for follow-up evaluation of a wound to the left foot.  She presents today for an acute appointment because she states that she has noticed a knot form next the incision.  She states that she had apparent trauma she could get the area wet and since the wound got wet she noted some skin started peeling on the area and the wound is gotten slightly larger.  She denies any surrounding redness or red streaks.  She is feels well and she denies any fevers, chills, nausea, vomiting.  She denies any calf pain, chest pain, shortness of breath.  Objective: General: No acute distress, AAOx3  DP/PT pulses palpable 2/4, CRT < 3 sec to all digits.  LEFT foot: Incision is well coapted without any evidence of dehiscence and a scar has formed and the incision is well healed.  The wound measures 1.7 x 1 cm and with a granular wound base.  There is epidermalysis around the periphery of the incision.  The central area does probe approximate 0.2 cm but there is no probing to bone, undermining or tunneling.  There is no surrounding erythema, ascending cellulitis.  No fluctuation crepitation any malodor.  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  I did sharply debride the wound today utilizing a tissue nipper as well as a 312 with scalpel down to healthy, bleeding, granular tissue.  Continue with daily dressing changes.the wound is slightly larger.  We will switch to Actisorb dressing changes daily. -Continue cam boot and offloading.  We are waiting the [place].  -Monitor for any clinical signs or symptoms of infection and directed to call the office immediately should any occur or go to the ER.  Trula Slade DPM

## 2018-08-02 ENCOUNTER — Encounter: Payer: Self-pay | Admitting: Podiatry

## 2018-08-02 ENCOUNTER — Ambulatory Visit (INDEPENDENT_AMBULATORY_CARE_PROVIDER_SITE_OTHER): Payer: Medicare Other | Admitting: Podiatry

## 2018-08-02 DIAGNOSIS — L97521 Non-pressure chronic ulcer of other part of left foot limited to breakdown of skin: Secondary | ICD-10-CM | POA: Diagnosis not present

## 2018-08-02 DIAGNOSIS — M14672 Charcot's joint, left ankle and foot: Secondary | ICD-10-CM

## 2018-08-03 NOTE — Progress Notes (Signed)
  Subjective: Olivia Romero is a 63 y.o. is seen today for follow-up evaluation of a wound to the left foot.  She is currently on Augmentin as prescribed by her primary care physician for a sinus infection.  She states that the wound is doing okay.  She has some some drainage coming from the wound but denies any pus and is more clear to bloody drainage.  Denies any surrounding redness or red streaks.  No pain.  Denies any fevers, chills, nausea, vomiting.  Denies any calf pain, chest pain, shortness of breath.  No other concerns.   Objective: General: No acute distress, AAOx3  DP/PT pulses palpable 2/4, CRT < 3 sec to all digits.  LEFT foot: Incision is well coapted without any evidence of dehiscence and a scar has formed and the incision is well healed.  The wound measures 2 x 1 x 0.2 cm and with a granular wound base.  There is epidermalysis around the periphery of the incision.  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  I did sharply debride the wound today utilizing a tissue nipper as well as a 312 with scalpel down to healthy, bleeding, granular tissue.  Continue with daily dressing changes. Today I did apply a silver foam dressing to the wound and I gave her something to his well.  If there is any increase in drainage she can switch back to using Betadine.  Continue cam boot and limit activity and elevation.  We are still awaiting [name] boot.  She was to hold off any further surgical correction at this time.  Also she is getting frustrated because her son is wearing her to overload amputation but she is not ready for that.  Also I think we are going continue to try to save this foot. -Monitor for any clinical signs or symptoms of infection and directed to call the office  immediately should any occur or go to the ER.  Trula Slade DPM

## 2018-08-03 NOTE — Progress Notes (Addendum)
  Subjective: Olivia Romero is a 63 y.o. is seen today for follow-up evaluation of a wound to the left foot.  She is here for dressing change today.  She denies any significant changes she denies any surrounding redness or red streaks.  She does notice some clear bloody drainage but denies any pus.  No increase in pain.  She still on the Augmentin that was given by her primary care physician for sinus infection.  She denies any fevers, chills, nausea, vomiting.  No calf pain, chest pain, shortness of breath.  No other concerns today.  Objective: General: No acute distress, AAOx3  DP/PT pulses palpable 2/4, CRT < 3 sec to all digits.  LEFT foot: Incision is well coapted without any evidence of dehiscence and a scar has formed and the incision is well healed.  The wound measures 2 x 1 cm and with a granular wound base.  Today the wound does probe approximate 0.3 cm but there is no probing to bone, undermining or tunneling.  There is no surrounding erythema, ascending cellulitis.  Is no fluctuation crepitation any malodor.  There is mild drainage on the bandage.  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 No pain with calf compression, swelling, warmth, erythema.  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  I did sharply debride the wound today utilizing a tissue nipper as well as a 312 with scalpel down to healthy, bleeding, granular tissue.  Continue with daily dressing changes.due to the drainage and continued wound were going to switch to Actisorb and this was dispensed today and papers complaints very for [facility] for dressings.  -We discussed further surgical intervention for debridement possible graft application.  Also still awaiting the [place].  She does not want to go back to do to consider surgery.  She is also hesitant  to go to the wound care center. -Monitor for any clinical signs or symptoms of infection and directed to call the office immediately should any occur or go to the ER.  Trula Slade DPM

## 2018-08-07 DIAGNOSIS — H35341 Macular cyst, hole, or pseudohole, right eye: Secondary | ICD-10-CM | POA: Diagnosis not present

## 2018-08-08 ENCOUNTER — Telehealth: Payer: Self-pay | Admitting: Podiatry

## 2018-08-08 NOTE — Telephone Encounter (Signed)
Callie with [facility] called in regards to an order for actisorb for Olivia Romero and we are unable to get that. I was calling to get clarification on that. If you could call me back at [phone].

## 2018-08-08 NOTE — Telephone Encounter (Signed)
I left message informing Callie - [facility], Dr. Jacqualyn Posey wanted pt to use Collagen Silver.

## 2018-08-09 ENCOUNTER — Other Ambulatory Visit: Payer: Medicare Other | Admitting: Orthotics

## 2018-08-09 ENCOUNTER — Ambulatory Visit (INDEPENDENT_AMBULATORY_CARE_PROVIDER_SITE_OTHER): Payer: Medicare Other | Admitting: Podiatry

## 2018-08-09 ENCOUNTER — Encounter: Payer: Self-pay | Admitting: Podiatry

## 2018-08-09 DIAGNOSIS — M14672 Charcot's joint, left ankle and foot: Secondary | ICD-10-CM

## 2018-08-09 DIAGNOSIS — E1149 Type 2 diabetes mellitus with other diabetic neurological complication: Secondary | ICD-10-CM | POA: Diagnosis not present

## 2018-08-09 DIAGNOSIS — M21542 Acquired clubfoot, left foot: Secondary | ICD-10-CM

## 2018-08-09 DIAGNOSIS — L97522 Non-pressure chronic ulcer of other part of left foot with fat layer exposed: Secondary | ICD-10-CM

## 2018-08-09 DIAGNOSIS — L97521 Non-pressure chronic ulcer of other part of left foot limited to breakdown of skin: Secondary | ICD-10-CM

## 2018-08-12 ENCOUNTER — Telehealth: Payer: Self-pay | Admitting: *Deleted

## 2018-08-12 NOTE — Telephone Encounter (Signed)
-----   Message from Trula Slade, DPM sent at 08/12/2018  8:16 AM EDT ----- Can you see if we can get home health set up for her? She got all the supplies delivered I think already.   The person she was using was Becky Sax (not sure what company). Her direct number is [phone]

## 2018-08-12 NOTE — Telephone Encounter (Signed)
Pt was previously established with [facility].

## 2018-08-13 NOTE — Progress Notes (Signed)
  Subjective: Olivia Romero is a 63 y.o. is seen today for follow-up evaluation of a wound to the left foot.  She is here for dressing change today.  Overall she states the wound is doing about the same.  She has some occasional clear to bloody drainage but denies any pus and denies any surrounding redness or red streaks.  She is off the antibiotics at this time.  She denies any fevers, chills, nausea, vomiting.  No calf pain, chest pain, shortness of breath.  She has no other concerns today.  Objective: General: No acute distress, AAOx3  DP/PT pulses palpable 2/4, CRT < 3 sec to all digits.  LEFT foot: Incision is well coapted without any evidence of dehiscence and a scar has formed and the incision is well healed.  The wound measures 2 x 1.2 cm.  There is a depth of approximate 0.3 cm.  Faint rim of surrounding erythema there is no ascending cellulitis there is no fluctuation or crepitation malodor.  Mild macerated periwound.  There is no drainage or pus identified from the wound today but there is some bloody drainage on the bandage. Cavovarus deformity is present.  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 No pain with calf compression, swelling, warmth, erythema.  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  I did sharply debride the wound today utilizing a tissue nipper as well as a 312 with scalpel down to healthy, bleeding, granular tissue.  We will continue with Betadine with a try to the area for now.  Once that dries up some is there is some faint macerated tissue to the wound will switch back to Prisma.  -[name] walker was dispensed today by [employer].  We discussed to start very slowly wearing 15 minutes a day to make sure is not rubbing or causing any irritation.  We will see her back in 1 week to check the wound  and also for Betha to evaluate the [name] -Today we had a long discussion regards to surgical intervention.  She is not ready to have surgery to have it the fifth ray resected but we discussed that there is any worsening of the wound that she needs to have this done most likely to help prevent spreading. She understands this.  -Monitor for any clinical signs or symptoms of infection and directed to call the office immediately should any occur or go to the ER.  Trula Slade DPM

## 2018-08-14 NOTE — Telephone Encounter (Signed)
Received 08/09/2018 clinicals and faxed with required referral form, and demographics to [facility].

## 2018-08-14 NOTE — Telephone Encounter (Signed)
My note is done and order are in there.   Will do betadine wet to dry for now.

## 2018-08-16 ENCOUNTER — Ambulatory Visit: Payer: Medicare Other | Admitting: Orthotics

## 2018-08-16 ENCOUNTER — Ambulatory Visit (INDEPENDENT_AMBULATORY_CARE_PROVIDER_SITE_OTHER): Payer: Medicare Other | Admitting: Podiatry

## 2018-08-16 ENCOUNTER — Other Ambulatory Visit: Payer: Self-pay

## 2018-08-16 ENCOUNTER — Telehealth: Payer: Self-pay | Admitting: *Deleted

## 2018-08-16 DIAGNOSIS — E1149 Type 2 diabetes mellitus with other diabetic neurological complication: Secondary | ICD-10-CM | POA: Diagnosis not present

## 2018-08-16 DIAGNOSIS — L97521 Non-pressure chronic ulcer of other part of left foot limited to breakdown of skin: Secondary | ICD-10-CM

## 2018-08-16 DIAGNOSIS — L97522 Non-pressure chronic ulcer of other part of left foot with fat layer exposed: Secondary | ICD-10-CM

## 2018-08-16 DIAGNOSIS — M14672 Charcot's joint, left ankle and foot: Secondary | ICD-10-CM

## 2018-08-16 MED ORDER — AMOXICILLIN-POT CLAVULANATE 875-125 MG PO TABS: 1.0000 | ORAL_TABLET | Freq: Two times a day (BID) | ORAL | 0 refills | Status: DC

## 2018-08-16 NOTE — Telephone Encounter (Signed)
Dr. Jacqualyn Posey ordered Prisma to left foot wound, moistened with small amount of saline, cover with gauze and wrap with kerlix 3 x week. Faxed to [place].

## 2018-08-16 NOTE — Telephone Encounter (Signed)
[facility] states pt is able to perform wound care and [employer] will not okay HHC for this same wound.

## 2018-08-16 NOTE — Telephone Encounter (Addendum)
I informed pt of [place] statement. Pt states understanding and she can do the dressing changes.

## 2018-08-16 NOTE — Progress Notes (Signed)
Took brace to [facility] and they suggested I expand size of brace by reducing lining on upper shell by 1/8".  I used their [place] sander to do this.

## 2018-08-25 NOTE — Progress Notes (Signed)
  Subjective: Olivia Romero is a 63 y.o. is seen today for follow-up evaluation of a wound to the left foot. She also states that the [place] has been doing well.  She feels that somewhat tight along the calf but otherwise feels great when she tries to walk.  She states the wound is doing about the same.  She gets some drainage from the area but overall the drainage is improved.  She is no longer on any antibiotics.  Denies any increase in swelling or redness to her foot.  Denies any fevers, chills, nausea, vomiting.  No calf pain, chest pain, shortness of breath.  Objective: General: No acute distress, AAOx3  DP/PT pulses palpable 2/4, CRT < 3 sec to all digits.  LEFT foot: Incision is well coapted without any evidence of dehiscence and a scar has formed and the incision is well healed.  The wound measures 2 x 1.4 cm.  There is a depth of approximate 0.3 cm.  There is just a faint rim of surrounding erythema but there is no ascending cellulitis.  No drainage or pus identified for the wound today but there is some serosanguineous drainage on the bandage.  There is no fluctuation crepitation any malodor.  No ascending cellulitis.  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 No pain with calf compression, swelling, warmth, erythema.  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  I did sharply debride the wound today utilizing a tissue nipper as well as a 312 with scalpel down to healthy, bleeding, granular tissue.  We are going to use Prisma every other day but directed if there is drainage that she can change every day if needed. -[name] was modified today by Liliane Channel and seems to be feeling much better. -Discussed with him further surgical intervention, grandson we also discussed other alternatives to help the wound heal including PRP,  EPAT. Will further consider those.  Ultimately we discussed with her fifth ray resection however she does not want surgery at this time. -Augmentin given the continue rim of erythema and drainage.  -Discussed TCC but she declines  -Monitor for any clinical signs or symptoms of infection and directed to call the office immediately should any occur or go to the ER.  Trula Slade DPM

## 2018-08-26 ENCOUNTER — Other Ambulatory Visit: Payer: Self-pay

## 2018-08-26 ENCOUNTER — Ambulatory Visit (INDEPENDENT_AMBULATORY_CARE_PROVIDER_SITE_OTHER): Payer: Medicare Other | Admitting: Podiatry

## 2018-08-26 VITALS — Temp 98.5°F

## 2018-08-26 DIAGNOSIS — E1149 Type 2 diabetes mellitus with other diabetic neurological complication: Secondary | ICD-10-CM | POA: Diagnosis not present

## 2018-08-26 DIAGNOSIS — L97522 Non-pressure chronic ulcer of other part of left foot with fat layer exposed: Secondary | ICD-10-CM

## 2018-08-27 NOTE — Progress Notes (Signed)
  Subjective: Olivia Romero is a 63 y.o. is seen today for follow-up evaluation of a wound to the left foot.  She feels the wound is getting better.  She is noticed significantly improved drainage to the area.  She has been continuing with Prisma every other day.  She is still taking the Augmentin.  She denies any pus.  She has still been wearing the [place] and getting used to it.  She has no new issues.  Denies any fevers, chills, nausea, vomiting.  No calf pain, chest pain, shortness of breath.  Objective: General: No acute distress, AAOx3  DP/PT pulses palpable 2/4, CRT < 3 sec to all digits.  LEFT foot: Incision is well coapted without any evidence of dehiscence and a scar has formed and the incision is well healed.  The wound measures 2 x 1.4 cm.  The pre-and post wound measurements are the same.  Overall the wound is the same size however there is significantly proved drainage and the area started to dry.  There is faint rim of surrounding erythema but there is no ascending cellulitis.  There is no fluctuation crepitation any malodor. No other open lesions. No pain with calf compression, swelling, warmth, erythema.  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  I did sharply debride the wound today utilizing a tissue nipper as well as a 312 with scalpel down to healthy, bleeding, granular tissue.  We are going to use Prisma every other day but directed if there is drainage that she can change every day if needed.  Given this is been a chronic issue for her for quite some time and not significant improvement going to try and order a graft to apply to the wound in the office.  We discussed fifth ray resection but she declines this.  She does not want any further surgery at this time. -Finish course of antibiotics -Monitor for any clinical  signs or symptoms of infection and directed to call the office immediately should any occur or go to the ER.  Trula Slade DPM

## 2018-08-29 ENCOUNTER — Telehealth: Payer: Self-pay | Admitting: *Deleted

## 2018-08-29 NOTE — Telephone Encounter (Signed)
Faxed required form completed by Dr. Jacqualyn Posey, clinicals and demographics to [facility].

## 2018-09-05 ENCOUNTER — Other Ambulatory Visit: Payer: Self-pay

## 2018-09-05 ENCOUNTER — Encounter: Payer: Self-pay | Admitting: Podiatry

## 2018-09-05 ENCOUNTER — Ambulatory Visit (INDEPENDENT_AMBULATORY_CARE_PROVIDER_SITE_OTHER): Payer: Medicare Other | Admitting: Podiatry

## 2018-09-05 VITALS — Temp 97.7°F

## 2018-09-05 DIAGNOSIS — E1149 Type 2 diabetes mellitus with other diabetic neurological complication: Secondary | ICD-10-CM

## 2018-09-05 DIAGNOSIS — L97522 Non-pressure chronic ulcer of other part of left foot with fat layer exposed: Secondary | ICD-10-CM | POA: Diagnosis not present

## 2018-09-06 NOTE — Telephone Encounter (Signed)
[facility] IVR states pt's insurance has approved for the Artacent graft. [facility] - Accounts Recievable states pt will be responsible for $250.00/visit/application. I informed Dr. Jacqualyn Posey.

## 2018-09-10 DIAGNOSIS — E1142 Type 2 diabetes mellitus with diabetic polyneuropathy: Secondary | ICD-10-CM | POA: Diagnosis not present

## 2018-09-10 NOTE — Progress Notes (Signed)
  Subjective: Olivia Romero is a 63 y.o. is seen today for follow-up evaluation of a wound to the left foot.  She feels the wound is getting better.  She is continued with every other day dressing changes utilizing Prisma.  There was couple days where she used Betadine to help dry the area and she feels that the wound is doing much better since much less drainage she denies any surrounding redness or red streaks.  No pus.  No pain.  She is still using the [place].  It is fitting better.  Denies any fevers, chills, nausea, vomiting.  No calf pain, chest pain, shortness of breath.  Objective: General: No acute distress, AAOx3  DP/PT pulses palpable 2/4, CRT < 3 sec to all digits.  LEFT foot: Incision is well coapted without any evidence of dehiscence and a scar has formed and the incision is well healed.  The wound measures 1.8 x 1.5 x 0.3 cm after debridement.  The pre-and post wound measurements are the same.  Overall the wound has a better appearance.  Is more granular.  Mild hyperkeratotic periwound.  There is no fluctuation crepitation any malodor. No other open lesions. No pain with calf compression, swelling, warmth, erythema.  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  I did sharply debride the wound today utilizing a tissue nipper as well as a 312 with scalpel down to healthy, bleeding, granular tissue. Continue with every other day Prisma dressing changes.  -Less than we discussed other treatment options to help the wound.  We discussed EPAT to help increase the blood flow to the area.  Is not a guarantee but to hopefully get this wound doing then we can get it to heal.  Today we completed the first treatment of EPAT that any complications.  She tolerated this well.  After this the wound was cleaned and dressed. -Continue [name] boot.   -She wants to hold off on any surgery.  We discussed amputation of the fifth ray. -Monitor for any clinical signs or symptoms of infection and directed to call the office immediately should any occur or go to the ER.  Trula Slade DPM

## 2018-09-12 ENCOUNTER — Telehealth: Payer: Self-pay | Admitting: *Deleted

## 2018-09-12 ENCOUNTER — Encounter: Payer: Self-pay | Admitting: Podiatry

## 2018-09-12 ENCOUNTER — Ambulatory Visit (INDEPENDENT_AMBULATORY_CARE_PROVIDER_SITE_OTHER): Payer: Medicare Other | Admitting: Podiatry

## 2018-09-12 ENCOUNTER — Other Ambulatory Visit: Payer: Self-pay

## 2018-09-12 DIAGNOSIS — E1149 Type 2 diabetes mellitus with other diabetic neurological complication: Secondary | ICD-10-CM | POA: Diagnosis not present

## 2018-09-12 DIAGNOSIS — L97522 Non-pressure chronic ulcer of other part of left foot with fat layer exposed: Secondary | ICD-10-CM

## 2018-09-12 NOTE — Progress Notes (Signed)
  Subjective: Olivia Romero is a 63 y.o. is seen today for follow-up evaluation of a wound to the left foot.  Overall she states that she is doing well and she feels the wound is getting smaller.  She still changing the bandage every other day with primsa dressing changes.  She states that she has not noted any significant redness or any drainage or pus.  She also has no red streaks.  She has been using the [name] boot but still rubbing on the side of leg but no skin breakdown or irritation otherwise.  She denies any fevers, chills, nausea, vomiting.  No calf pain, chest pain, shortness of breath.  Objective: General: No acute distress, AAOx3  DP/PT pulses palpable 2/4, CRT < 3 sec to all digits.  LEFT foot: Incision is well coapted without any evidence of dehiscence and a scar has formed and the incision is well healed.  The wound measures 1.5 x 1.5 x 0.1 cm after debridement.  The pre-and post wound measurements are the same.  Mild hyperkeratotic periwound.  Mild surrounding erythema but this is more from inflammation as opposed to infection.  There is no drainage or pus there is no ascending cellulitis.  There is no malodor.  No other area of skin breakdown.  No pain with calf compression, swelling, warmth, erythema.  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  Overall the wound is doing better since last appointment.  Use silver nitrate as there was an area of hyper granulation tissue present.  Clean the wound with saline.  Today she underwent her second treatment of shockwave therapy for a total of 3000 pulses.  She tolerated well without any complications.  We will continue daily dressing changes and then clean the wound and Prisma was applied followed by dry  sterile dressing -Rick did modify the [name] boot to help decrease pressure to the leg -Monitor for any clinical signs or symptoms of infection and directed to call the office immediately should any occur or go to the ER.  RTC 1 week or sooner if needed  Trula Slade DPM

## 2018-09-12 NOTE — Telephone Encounter (Signed)
Dr. Jacqualyn Posey ordered wound supplies, listing is sent to scan after being faxed to [facility] with clinical and demographics.

## 2018-09-17 ENCOUNTER — Ambulatory Visit (INDEPENDENT_AMBULATORY_CARE_PROVIDER_SITE_OTHER): Payer: Medicare Other | Admitting: Family Medicine

## 2018-09-17 ENCOUNTER — Other Ambulatory Visit: Payer: Self-pay

## 2018-09-17 DIAGNOSIS — E11610 Type 2 diabetes mellitus with diabetic neuropathic arthropathy: Secondary | ICD-10-CM | POA: Diagnosis not present

## 2018-09-17 DIAGNOSIS — E1169 Type 2 diabetes mellitus with other specified complication: Secondary | ICD-10-CM | POA: Diagnosis not present

## 2018-09-17 DIAGNOSIS — E1142 Type 2 diabetes mellitus with diabetic polyneuropathy: Secondary | ICD-10-CM | POA: Diagnosis not present

## 2018-09-17 DIAGNOSIS — R350 Frequency of micturition: Secondary | ICD-10-CM | POA: Diagnosis not present

## 2018-09-17 DIAGNOSIS — Z794 Long term (current) use of insulin: Secondary | ICD-10-CM | POA: Diagnosis not present

## 2018-09-17 DIAGNOSIS — E669 Obesity, unspecified: Secondary | ICD-10-CM | POA: Diagnosis not present

## 2018-09-17 LAB — HEMOGLOBIN A1C: Hemoglobin A1C: 7.9

## 2018-09-17 MED ORDER — NITROFURANTOIN MONOHYD MACRO 100 MG PO CAPS: 100.0000 mg | ORAL_CAPSULE | Freq: Two times a day (BID) | ORAL | 0 refills | Status: DC

## 2018-09-17 NOTE — Progress Notes (Signed)
There were no vitals taken for this visit.   Subjective:    Patient ID: Olivia Romero, female    DOB: [birth year], 63 y.o.   MRN: [id number]  HPI: Olivia Romero is a 63 y.o. female  No chief complaint on file. cc: urine problems  HPI Virtual Visit via Telephone/Video Note   I connected with the patient via:  telephone I verified that I am speaking with the correct person using two identifiers.  Call started: 10:45 am Call terminated: 11:06 am Total length of call: 20 minutes 57 seconds   I discussed the limitations, risks, and privacy concerns of performing an evaluation and management service by telephone and the availability of in-person appointments. I explained that he/she may be responsible for charges related to this service. The patient expressed understanding and agreed to proceed.  Provider location: home, upstairs office with door closed, earphones/headset on Patient location: home Additional participants: daughter-in-law and grandson so they may overhear  In the context of COVID-19, she is aware that she is at high risk for complications; only going to foot doctor; really staying at home, stays away from people  She has been seeing Dr. Jacqualyn Posey; all finished with the augmentin; her foot is getting better; looks a lot better  She has diabetes; last A1c was 7.9 on September 10, 2018, [facility])  She talked to Dr. Gabriel Carina this morning; she said to call me; patient is having issues where she doesn't make it to the bathroom; happening a lot; being really regular; urgency; trouble holding it; she'll have it hit her all of a sudden; feels like it starts and it just comes out; she cannot make it stop; wonders if neuropathy from her diabetes; she strains so hard to stop it and it just pours; going on for one month really bad; she keeps thinking she can deal with this herself; no hx of urinary tract surgery; no dysuria; the urine has an  odor at times; no visible blood in the urine; no hx of kidney stones or bladder stones; she drinks some caffeine, not that much; she drinks half and half coffee; has cut back on that, maybe 3 times a week; some decaffeinated soft drinks diet Mt Dew; medicines reviewed  HTN; BP 120/70 something; she takes for HTN and for swelling in her legs and feet; started in the fall by Dr. Gabriel Carina  Relevant past medical, surgical, family and social history reviewed Past Medical History:  Diagnosis Date  . Anemia   . Arthritis   . Asthma   . Carpal tunnel syndrome on right   . Colon polyps    adenomatous  . Diabetes mellitus without complication ([place])   . Diverticulosis of colon   . Esophagitis   . GERD (gastroesophageal reflux disease)   . Hemorrhoid    internal  . Hyperlipemia   . Hypertension   . Neuropathy   . Pneumonia   . PONV (postoperative nausea and vomiting)   . Sleep apnea    has had in the past lost 50 pounds and do longer uses cpap  . Stroke-like episode ([place]) 2009   TIA   Past Surgical History:  Procedure Laterality Date  . ABDOMINAL HYSTERECTOMY    . BACK SURGERY  09/02/2015  . CERVICAL FUSION    . CESAREAN SECTION  1986  . COLONOSCOPY N/A 11/30/2012   Procedure: COLONOSCOPY;  Surgeon: Irene Shipper, MD;  Location: [facility] ENDOSCOPY;  Service: Endoscopy;  Laterality:  N/A;  . COLONOSCOPY WITH PROPOFOL N/A 01/03/2016   Procedure: COLONOSCOPY WITH PROPOFOL;  Surgeon: Manya Silvas, MD;  Location: [facility] ENDOSCOPY;  Service: Endoscopy;  Laterality: N/A;  . ESOPHAGOGASTRODUODENOSCOPY (EGD) WITH PROPOFOL N/A 01/14/2015   Procedure: ESOPHAGOGASTRODUODENOSCOPY (EGD) WITH PROPOFOL;  Surgeon: Manya Silvas, MD;  Location: [facility] ENDOSCOPY;  Service: Endoscopy;  Laterality: N/A;  . IRRIGATION AND DEBRIDEMENT FOOT Left 03/13/2018   Procedure: IRRIGATION AND DEBRIDEMENT FOOT WITH BONE BIOPSY WITH MISONIX DEBRIDER;  Surgeon: Evelina Bucy, DPM;  Location: [place];  Service: Podiatry;  Laterality:  Left;  . LUMBAR WOUND DEBRIDEMENT N/A 10/01/2015   Procedure: LUMBAR WOUND DEBRIDEMENT;  Surgeon: Ashok Pall, MD;  Location: [place] NEURO ORS;  Service: Neurosurgery;  Laterality: N/A;  LUMBAR WOUND DEBRIDEMENT  . SAVORY DILATION N/A 01/14/2015   Procedure: SAVORY DILATION;  Surgeon: Manya Silvas, MD;  Location: [facility] ENDOSCOPY;  Service: Endoscopy;  Laterality: N/A;  . TONSILLECTOMY     Family History  Problem Relation Age of Onset  . Lung cancer Father   . Hypertension Father   . Arthritis Father   . Other Mother        hardening of the arteries/renal cell carcenoma  . Hypertension Mother   . Stroke Mother   . Kidney cancer Mother   . Arthritis Brother   . Rheum arthritis Maternal Uncle   . Bladder Cancer Neg Hx    Social History   Tobacco Use  . Smoking status: Never Smoker  . Smokeless tobacco: Never Used  Substance Use Topics  . Alcohol use: Never    Frequency: Never  . Drug use: No     Office Visit from 07/26/2018 in [facility]  AUDIT-C Score  0      Interim medical history since last visit reviewed. Allergies and medications reviewed  Review of Systems  Constitutional: Negative for fever.  Genitourinary: Positive for urgency. Negative for dysuria and flank pain.   Per HPI unless specifically indicated above     Objective:    There were no vitals taken for this visit.  Wt Readings from Last 3 Encounters:  07/26/18 183 lb 9.6 oz (83.3 kg)  06/03/18 185 lb (83.9 kg)  04/16/18 189 lb (85.7 kg)    Physical Exam Pulmonary:     Effort: No respiratory distress.  Neurological:     Mental Status: She is alert.     Cranial Nerves: No dysarthria.  Psychiatric:        Speech: Speech is not rapid and pressured, delayed or slurred.     Results for orders placed or performed in visit on 09/17/18  Hemoglobin A1c  Result Value Ref Range   Hemoglobin A1C 7.9       Assessment & Plan:   Problem List Items Addressed This Visit    None     Visit Diagnoses    Urinary frequency    -  Primary   possible UTI vs OA; in light of COVID-19, start ABX empirically, urine in 7 days if not improving; call if needed; avoid caffeine; consider detrol, mybetriq   Relevant Orders   Urinalysis w microscopic + reflex cultur       Follow up plan: No follow-ups on file.  An after-visit summary was printed and given to the patient at [place].  Please see the patient instructions which may contain other information and recommendations beyond what is mentioned above in the assessment and plan.  Meds ordered this encounter  Medications  .  nitrofurantoin, macrocrystal-monohydrate, (MACROBID) 100 MG capsule    Sig: Take 1 capsule (100 mg total) by mouth 2 (two) times daily.    Dispense:  14 capsule    Refill:  0    Orders Placed This Encounter  Procedures  . Urinalysis w microscopic + reflex cultur

## 2018-09-18 DIAGNOSIS — E11610 Type 2 diabetes mellitus with diabetic neuropathic arthropathy: Secondary | ICD-10-CM | POA: Insufficient documentation

## 2018-09-20 ENCOUNTER — Ambulatory Visit (INDEPENDENT_AMBULATORY_CARE_PROVIDER_SITE_OTHER): Payer: Medicare Other | Admitting: Podiatry

## 2018-09-20 ENCOUNTER — Encounter: Payer: Self-pay | Admitting: Podiatry

## 2018-09-20 ENCOUNTER — Other Ambulatory Visit: Payer: Self-pay

## 2018-09-20 DIAGNOSIS — L97522 Non-pressure chronic ulcer of other part of left foot with fat layer exposed: Secondary | ICD-10-CM

## 2018-09-20 DIAGNOSIS — E1149 Type 2 diabetes mellitus with other diabetic neurological complication: Secondary | ICD-10-CM | POA: Diagnosis not present

## 2018-09-24 NOTE — Progress Notes (Signed)
  Subjective: Olivia Romero is a 63 y.o. is seen today for follow-up evaluation of a wound to the left foot. She states that she is doing much better. She has continued with every other day dressing changes.  She has been using PRIMSA the wound.  She denies any surrounding redness or red streaks and denies any pus.  Minimal drainage at times.  No pain. [name] is feeling better since Olivia Romero modified it last appointment.   She denies any fevers, chills, nausea, vomiting.  No calf pain, chest pain, shortness of breath.  Objective: General: No acute distress, AAOx3  DP/PT pulses palpable 2/4, CRT < 3 sec to all digits.  LEFT foot: Incision is well coapted without any evidence of dehiscence and a scar has formed and the incision is well healed.  The wound measures 1.5 x 1.2 x 0.1 cm after debridement.  There is some mild hyper granulation tissue in the periphery of the wound.  The pre-and post wound measurements are the same.  Mild hyperkeratotic periwound.  Mild surrounding erythema but this is more from inflammation as opposed to infection.  There is no drainage or pus there is no ascending cellulitis.  There is no malodor.  No other area of skin breakdown.  No pain with calf compression, swelling, warmth, erythema.  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  Overall the wound has been improving.  Use silver nitrate as there was an area of hyper granulation tissue present.  Clean the wound with saline.  Today she underwent her second treatment of shockwave therapy for a total of 2000 pulses.  She tolerated well without any complications.  We will continue daily dressing changes and then clean the wound and Prisma was applied followed by dry sterile dressing -Continue  [name] boot. This is fitting better -Monitor for any clinical signs or symptoms of infection and directed to call the office immediately should any occur or go to the ER.  RTC 1 week or sooner if needed  Olivia Romero DPM

## 2018-09-26 ENCOUNTER — Other Ambulatory Visit: Payer: Self-pay

## 2018-09-26 ENCOUNTER — Ambulatory Visit (INDEPENDENT_AMBULATORY_CARE_PROVIDER_SITE_OTHER): Payer: Medicare Other | Admitting: Podiatry

## 2018-09-26 DIAGNOSIS — E1149 Type 2 diabetes mellitus with other diabetic neurological complication: Secondary | ICD-10-CM

## 2018-09-26 DIAGNOSIS — L97522 Non-pressure chronic ulcer of other part of left foot with fat layer exposed: Secondary | ICD-10-CM | POA: Diagnosis not present

## 2018-10-02 ENCOUNTER — Telehealth: Payer: Self-pay | Admitting: Family Medicine

## 2018-10-02 NOTE — Telephone Encounter (Signed)
Pt states that she is just a little sore and this was just a FYI. She does feel her neuropathy is getting worse and thinks this may be the cause of the fall. Pt declined a visit

## 2018-10-02 NOTE — Progress Notes (Signed)
  Subjective: Olivia Romero is a 63 y.o. is seen today for follow-up evaluation of a wound to the left foot.  She feels that overall is getting better and she thinks the treatment for even during the been very helpful.  She states there is been very minimal drainage coming from the area.  She denies any redness or red streaks.  No increase in swelling to her foot.  She also states that the [place] has been helpful and that she is walking better using this. She denies any fevers, chills, nausea, vomiting.  No calf pain, chest pain, shortness of breath.  Objective: General: No acute distress, AAOx3  DP/PT pulses palpable 2/4, CRT < 3 sec to all digits.  LEFT foot: Incision is well coapted without any evidence of dehiscence and a scar has formed and the incision is well healed.  The wound measures 1.2 x 0.9 x 0.2 cm after debridement.  There is some mild hyper granulation tissue in the periphery of the wound.  The pre-and post wound measurements are the same.  Mild hyperkeratotic periwound although less from what it has been previously.  Mild surrounding erythema but this is more from inflammation as opposed to infection.  There is no drainage or pus there is no ascending cellulitis.  There is no malodor.  No other area of skin breakdown.  No pain with calf compression, swelling, warmth, erythema.  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  Use silver nitrate to the area of hyper granulation tissue present.  Cleaned the wound with saline.  Today she underwent her third treatment of shockwave therapy for a total of 2000 pulses.  She tolerated well without any complications.  We will continue daily dressing changes and then clean the wound and Prisma was applied followed by  dry sterile dressing -Continue [name] boot. This is fitting better -Monitor for any clinical signs or symptoms of infection and directed to call the office immediately should any occur or go to the ER.  RTC 1 week or sooner if needed  Trula Slade DPM

## 2018-10-02 NOTE — Telephone Encounter (Signed)
Copied from [place] [id number]. Topic: General - Other >> Oct 02, 2018 11:52 AM Pauline Good wrote: Reason for CRM: pt stated she has neuropathy and she fell today because of it. Pt wanted to let the doctor know

## 2018-10-02 NOTE — Telephone Encounter (Signed)
If patient has injury or concern, please schedule a visit.

## 2018-10-04 ENCOUNTER — Ambulatory Visit (INDEPENDENT_AMBULATORY_CARE_PROVIDER_SITE_OTHER): Payer: Medicare Other

## 2018-10-04 ENCOUNTER — Ambulatory Visit (INDEPENDENT_AMBULATORY_CARE_PROVIDER_SITE_OTHER): Payer: Medicare Other | Admitting: Podiatry

## 2018-10-04 ENCOUNTER — Other Ambulatory Visit: Payer: Self-pay

## 2018-10-04 ENCOUNTER — Encounter: Payer: Self-pay | Admitting: Podiatry

## 2018-10-04 VITALS — Temp 97.3°F

## 2018-10-04 DIAGNOSIS — R2681 Unsteadiness on feet: Secondary | ICD-10-CM

## 2018-10-04 DIAGNOSIS — L97521 Non-pressure chronic ulcer of other part of left foot limited to breakdown of skin: Secondary | ICD-10-CM

## 2018-10-04 DIAGNOSIS — S99921A Unspecified injury of right foot, initial encounter: Secondary | ICD-10-CM | POA: Diagnosis not present

## 2018-10-04 DIAGNOSIS — M779 Enthesopathy, unspecified: Secondary | ICD-10-CM | POA: Diagnosis not present

## 2018-10-04 DIAGNOSIS — W19XXXA Unspecified fall, initial encounter: Secondary | ICD-10-CM | POA: Diagnosis not present

## 2018-10-04 DIAGNOSIS — E1149 Type 2 diabetes mellitus with other diabetic neurological complication: Secondary | ICD-10-CM

## 2018-10-07 ENCOUNTER — Telehealth: Payer: Self-pay | Admitting: *Deleted

## 2018-10-07 DIAGNOSIS — W19XXXA Unspecified fall, initial encounter: Secondary | ICD-10-CM

## 2018-10-07 DIAGNOSIS — R2681 Unsteadiness on feet: Secondary | ICD-10-CM

## 2018-10-07 NOTE — Telephone Encounter (Signed)
I called pt and she performs her wound care. I told pt without a skilled nursing orders some insurance will not cover in-home PT. I asked pt if she would like to PT in-office with [facility] if PT is not covered at home. Pt stated she would come to PT in-office if needed. Faxed to [facility].

## 2018-10-07 NOTE — Telephone Encounter (Signed)
She is doing prisma on the wound 3 times a week

## 2018-10-07 NOTE — Telephone Encounter (Signed)
-----   Message from Trula Slade, DPM sent at 10/07/2018  8:19 AM EDT ----- An you order home PT through [facility]? She had a fall and needs gait training

## 2018-10-07 NOTE — Progress Notes (Signed)
  Subjective: Olivia Romero is a 63 y.o. is seen today for follow-up evaluation of a wound to the left foot.  She states she is doing well off of the left foot.  She is to continue with every other day dressing changes with Prisma and she feels that the wound is getting better.  She denies any drainage of pus or no swelling or redness to the left foot.  She is also continue to wear the [name] is fitting comfortably.  She has new concerns as she did fall injuring her right foot.  She states that she only has pain when primarily outside and it just started hurting.  She states that she did contact her primary care physician in regards to the fall as well.   She denies any fevers, chills, nausea, vomiting.  No calf pain, chest pain, shortness of breath.  Objective: General: No acute distress, AAOx3  DP/PT pulses palpable 2/4, CRT < 3 sec to all digits.  LEFT foot: Incision is well coapted without any evidence of dehiscence and a scar has formed and the incision is well healed.  The wound measures 0.9 x 0.7 x 0.1 cm after debridement.  There is some mild hyper granulation tissue in the periphery of the wound.  The pre-and post wound measurements are the same.  Mild hyperkeratotic periwound although less from what it has been previously.  No surrounding erythema, drainage or pus there is no ascending cellulitis.  There is no malodor.  No other area of skin breakdown.  On the right foot there is no any tenderness but there is mild discomfort with eversion of the foot.  Overall the peroneal tendon appears to be intact.  No significant discomfort of the peroneal tendon.  No pain with Achilles tendon.  Flexor, extensor tendons appear to be intact.  No significant swelling, redness or warmth of the right foot. No pain with calf compression, swelling, warmth, erythema.  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  Use silver nitrate to the area of hyper granulation tissue present.  Cleaned the wound with saline.  Today she underwent her third treatment of shockwave therapy for a total of 2000 pulses.  She tolerated well without any complications.  We will continue daily dressing changes and then clean the wound and Prisma was applied followed by dry sterile dressing -Continue [name] boot.  -X-rays were obtained reviewed the right side.  No evidence of acute fracture.  Charcot is present. Ankle brace as needed. Concerned about her neuropathy and fall and also she has been not as mobile recently. I am going to order home PT through [facility] to work on gait training, balance. She actually received a call from them this morning asking if she needed any strength training.  -Monitor for any clinical signs or symptoms of infection and directed to call the office immediately should any occur or go to the ER.  RTC 1 week or sooner if needed  Trula Slade DPM

## 2018-10-09 ENCOUNTER — Telehealth: Payer: Self-pay | Admitting: *Deleted

## 2018-10-09 DIAGNOSIS — E1121 Type 2 diabetes mellitus with diabetic nephropathy: Secondary | ICD-10-CM | POA: Diagnosis not present

## 2018-10-09 DIAGNOSIS — Z7982 Long term (current) use of aspirin: Secondary | ICD-10-CM | POA: Diagnosis not present

## 2018-10-09 DIAGNOSIS — W19XXXD Unspecified fall, subsequent encounter: Secondary | ICD-10-CM | POA: Diagnosis not present

## 2018-10-09 DIAGNOSIS — M779 Enthesopathy, unspecified: Secondary | ICD-10-CM | POA: Diagnosis not present

## 2018-10-09 DIAGNOSIS — S99921D Unspecified injury of right foot, subsequent encounter: Secondary | ICD-10-CM | POA: Diagnosis not present

## 2018-10-09 DIAGNOSIS — E11621 Type 2 diabetes mellitus with foot ulcer: Secondary | ICD-10-CM | POA: Diagnosis not present

## 2018-10-09 DIAGNOSIS — E11610 Type 2 diabetes mellitus with diabetic neuropathic arthropathy: Secondary | ICD-10-CM | POA: Diagnosis not present

## 2018-10-09 DIAGNOSIS — E1143 Type 2 diabetes mellitus with diabetic autonomic (poly)neuropathy: Secondary | ICD-10-CM | POA: Diagnosis not present

## 2018-10-09 DIAGNOSIS — E1165 Type 2 diabetes mellitus with hyperglycemia: Secondary | ICD-10-CM | POA: Diagnosis not present

## 2018-10-09 DIAGNOSIS — L97521 Non-pressure chronic ulcer of other part of left foot limited to breakdown of skin: Secondary | ICD-10-CM | POA: Diagnosis not present

## 2018-10-09 DIAGNOSIS — E1142 Type 2 diabetes mellitus with diabetic polyneuropathy: Secondary | ICD-10-CM | POA: Diagnosis not present

## 2018-10-09 DIAGNOSIS — E1149 Type 2 diabetes mellitus with other diabetic neurological complication: Secondary | ICD-10-CM | POA: Diagnosis not present

## 2018-10-09 DIAGNOSIS — Z7984 Long term (current) use of oral hypoglycemic drugs: Secondary | ICD-10-CM | POA: Diagnosis not present

## 2018-10-09 NOTE — Telephone Encounter (Signed)
[facility] states they have evaluated pt and would like PT for 2x week for 2 weeks and 1x week for 2 weeks.

## 2018-10-09 NOTE — Telephone Encounter (Signed)
Dr. Jacqualyn Posey states PT schedule is fine. I informed [facility].

## 2018-10-11 ENCOUNTER — Ambulatory Visit (INDEPENDENT_AMBULATORY_CARE_PROVIDER_SITE_OTHER): Payer: Medicare Other | Admitting: Podiatry

## 2018-10-11 ENCOUNTER — Other Ambulatory Visit: Payer: Self-pay

## 2018-10-11 DIAGNOSIS — L97521 Non-pressure chronic ulcer of other part of left foot limited to breakdown of skin: Secondary | ICD-10-CM | POA: Diagnosis not present

## 2018-10-11 DIAGNOSIS — R2681 Unsteadiness on feet: Secondary | ICD-10-CM

## 2018-10-11 DIAGNOSIS — E1149 Type 2 diabetes mellitus with other diabetic neurological complication: Secondary | ICD-10-CM

## 2018-10-11 DIAGNOSIS — S99921D Unspecified injury of right foot, subsequent encounter: Secondary | ICD-10-CM | POA: Diagnosis not present

## 2018-10-11 MED ORDER — DICLOFENAC SODIUM 1 % TD GEL: 2.0000 g | Freq: Four times a day (QID) | TRANSDERMAL | 2 refills | Status: DC

## 2018-10-11 NOTE — Progress Notes (Signed)
  Subjective: Olivia Romero is a 63 y.o. is seen today for follow-up evaluation of a wound to the left foot.  She states the wound is continued to improve.  She is still been using the Prisma dressing changes.  She denies any drainage or pus or any redness or swelling.  Overall the wound has been getting smaller.   She states the pain in the right foot is intermittent.  Reports getting better but she states every couple of days she will get a sharp pain.  No other falls or injuries since I last saw her.  No increase in swelling or redness.  She is started with physical therapy next week at home.  She denies any fevers, chills, nausea, vomiting.  No calf pain, chest pain, shortness of breath.  Objective: General: No acute distress, AAOx3  DP/PT pulses palpable 2/4, CRT < 3 sec to all digits.  LEFT foot: Incision is well coapted without any evidence of dehiscence and a scar has formed and the incision is well healed.  The wound measures 0.8 x 0.4 x 0.1 cm after debridement.  There is some mild hyper granulation tissue in the periphery of the wound.  The pre-and post wound measurements are the same.  Minimal hyperkeratotic periwound. No surrounding erythema, drainage or pus there is no ascending cellulitis.  There is no malodor.  No other area of skin breakdown.  On the right foot I am not able to elicit any area of tenderness.  No increase in edema no erythema to the foot.  Flexor, extensor tendons appear to be intact.  MMT 5/5. No other open lesions or pre-ulcerative lesions identified bilaterally. No pain with calf compression, swelling, warmth, erythema.  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  Use silver nitrate  applied. Cleaned the wound with saline.  Today she underwent her 4th treatment of shockwave therapy for a total of 2000 pulses.  She tolerated well without any complications.  We will continue daily dressing changes and then clean the wound and Prisma was applied followed by dry sterile dressing -Continue [name] boot.  -For the right foot will order diclofenac gel.  Also continue with home physical therapy.  Brace if needed.  If is any worsening to let me know. -Monitor for any clinical signs or symptoms of infection and directed to call the office immediately should any occur or go to the ER.  RTC 1 week or sooner if needed  Trula Slade DPM

## 2018-10-15 DIAGNOSIS — W19XXXD Unspecified fall, subsequent encounter: Secondary | ICD-10-CM | POA: Diagnosis not present

## 2018-10-15 DIAGNOSIS — M779 Enthesopathy, unspecified: Secondary | ICD-10-CM | POA: Diagnosis not present

## 2018-10-15 DIAGNOSIS — E1143 Type 2 diabetes mellitus with diabetic autonomic (poly)neuropathy: Secondary | ICD-10-CM | POA: Diagnosis not present

## 2018-10-15 DIAGNOSIS — E11610 Type 2 diabetes mellitus with diabetic neuropathic arthropathy: Secondary | ICD-10-CM | POA: Diagnosis not present

## 2018-10-15 DIAGNOSIS — S99921D Unspecified injury of right foot, subsequent encounter: Secondary | ICD-10-CM | POA: Diagnosis not present

## 2018-10-15 DIAGNOSIS — E1142 Type 2 diabetes mellitus with diabetic polyneuropathy: Secondary | ICD-10-CM | POA: Diagnosis not present

## 2018-10-17 DIAGNOSIS — W19XXXD Unspecified fall, subsequent encounter: Secondary | ICD-10-CM | POA: Diagnosis not present

## 2018-10-17 DIAGNOSIS — M779 Enthesopathy, unspecified: Secondary | ICD-10-CM | POA: Diagnosis not present

## 2018-10-17 DIAGNOSIS — S99921D Unspecified injury of right foot, subsequent encounter: Secondary | ICD-10-CM | POA: Diagnosis not present

## 2018-10-17 DIAGNOSIS — E1142 Type 2 diabetes mellitus with diabetic polyneuropathy: Secondary | ICD-10-CM | POA: Diagnosis not present

## 2018-10-17 DIAGNOSIS — E1143 Type 2 diabetes mellitus with diabetic autonomic (poly)neuropathy: Secondary | ICD-10-CM | POA: Diagnosis not present

## 2018-10-17 DIAGNOSIS — E11610 Type 2 diabetes mellitus with diabetic neuropathic arthropathy: Secondary | ICD-10-CM | POA: Diagnosis not present

## 2018-10-18 ENCOUNTER — Other Ambulatory Visit: Payer: Self-pay

## 2018-10-18 ENCOUNTER — Ambulatory Visit (INDEPENDENT_AMBULATORY_CARE_PROVIDER_SITE_OTHER): Payer: Medicare Other | Admitting: Podiatry

## 2018-10-18 ENCOUNTER — Encounter: Payer: Self-pay | Admitting: Podiatry

## 2018-10-18 VITALS — Temp 97.5°F

## 2018-10-18 DIAGNOSIS — L97521 Non-pressure chronic ulcer of other part of left foot limited to breakdown of skin: Secondary | ICD-10-CM | POA: Diagnosis not present

## 2018-10-18 DIAGNOSIS — E1149 Type 2 diabetes mellitus with other diabetic neurological complication: Secondary | ICD-10-CM | POA: Diagnosis not present

## 2018-10-20 MED ORDER — DOXYCYCLINE HYCLATE 100 MG PO TABS: 100.0000 mg | ORAL_TABLET | Freq: Two times a day (BID) | ORAL | 0 refills | Status: DC

## 2018-10-20 NOTE — Progress Notes (Signed)
  Subjective: Olivia Romero is a 63 y.o. is seen today for follow-up evaluation of a wound to the left foot.  She states that she is pleased as the wound is still making progress and doing better.  She thinks the treatment of the shockwave is been helpful.  She has not seen any drainage or pus.  She can continue with every other day dressing changes with Prisma dressing.  She denies any surrounding redness or red streaks.  She does state that she walked without the [place] into her foot went flat on the ground she was ecstatic to see this.  The pain in her right foot has resolved and her last saw her.  She has been doing physical therapy and she is sore from that due to the weakness but otherwise she is been doing well.  She denies any fevers, chills, nausea, vomiting.  No calf pain, chest pain, shortness of breath.  Objective: General: No acute distress, AAOx3  DP/PT pulses palpable 2/4, CRT < 3 sec to all digits.  LEFT foot: Incision is well coapted without any evidence of dehiscence and a scar has formed and the incision is well healed.  The wound measures 0.7 x 0.4 x 0.1 cm after debridement.  Mild hyperkeratotic periwound.  States there was some clear drainage expressed but there is no purulence.  There is no significant cellulitis there is no ascending cellulitis.  There is no increase in warmth in the foot.  There is no fluctuation or crepitation there is no malodor. Chronic Charcot changes are present. Varus of the left ankle > right.  No other open lesions or pre-ulcerative lesions identified bilaterally. No pain with calf compression, swelling, warmth, erythema.  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  Given the  mild increase in drainage will minimal redness which using Betadine dressing changes daily.  Directed by Betadine wet-to-dry.  Continue with the [name] boot. We did not do an EPAT treatment today.  Also has a precaution related to antibiotics and the drainage.  Prescribed doxycycline. -Monitor for any clinical signs or symptoms of infection and directed to call the office immediately should any occur or go to the ER.  RTC 1 week or sooner if needed  Trula Slade DPM

## 2018-10-21 DIAGNOSIS — E11610 Type 2 diabetes mellitus with diabetic neuropathic arthropathy: Secondary | ICD-10-CM | POA: Diagnosis not present

## 2018-10-21 DIAGNOSIS — E1143 Type 2 diabetes mellitus with diabetic autonomic (poly)neuropathy: Secondary | ICD-10-CM | POA: Diagnosis not present

## 2018-10-21 DIAGNOSIS — E1142 Type 2 diabetes mellitus with diabetic polyneuropathy: Secondary | ICD-10-CM | POA: Diagnosis not present

## 2018-10-21 DIAGNOSIS — M779 Enthesopathy, unspecified: Secondary | ICD-10-CM | POA: Diagnosis not present

## 2018-10-21 DIAGNOSIS — W19XXXD Unspecified fall, subsequent encounter: Secondary | ICD-10-CM | POA: Diagnosis not present

## 2018-10-21 DIAGNOSIS — S99921D Unspecified injury of right foot, subsequent encounter: Secondary | ICD-10-CM | POA: Diagnosis not present

## 2018-10-22 ENCOUNTER — Ambulatory Visit (INDEPENDENT_AMBULATORY_CARE_PROVIDER_SITE_OTHER): Payer: Medicare Other | Admitting: Podiatry

## 2018-10-22 ENCOUNTER — Other Ambulatory Visit: Payer: Self-pay

## 2018-10-22 ENCOUNTER — Encounter: Payer: Self-pay | Admitting: Podiatry

## 2018-10-22 DIAGNOSIS — L97521 Non-pressure chronic ulcer of other part of left foot limited to breakdown of skin: Secondary | ICD-10-CM

## 2018-10-22 DIAGNOSIS — E1149 Type 2 diabetes mellitus with other diabetic neurological complication: Secondary | ICD-10-CM | POA: Diagnosis not present

## 2018-10-24 DIAGNOSIS — W19XXXD Unspecified fall, subsequent encounter: Secondary | ICD-10-CM | POA: Diagnosis not present

## 2018-10-24 DIAGNOSIS — M779 Enthesopathy, unspecified: Secondary | ICD-10-CM | POA: Diagnosis not present

## 2018-10-24 DIAGNOSIS — E11610 Type 2 diabetes mellitus with diabetic neuropathic arthropathy: Secondary | ICD-10-CM | POA: Diagnosis not present

## 2018-10-24 DIAGNOSIS — S99921D Unspecified injury of right foot, subsequent encounter: Secondary | ICD-10-CM | POA: Diagnosis not present

## 2018-10-24 DIAGNOSIS — E1143 Type 2 diabetes mellitus with diabetic autonomic (poly)neuropathy: Secondary | ICD-10-CM | POA: Diagnosis not present

## 2018-10-24 DIAGNOSIS — E1142 Type 2 diabetes mellitus with diabetic polyneuropathy: Secondary | ICD-10-CM | POA: Diagnosis not present

## 2018-10-24 NOTE — Progress Notes (Signed)
  Subjective: Olivia Romero is a 63 y.o. is seen today for an acute appointment.  She states that she was taken the bandage off she saw some loose skin and the skin peeled off the bandage.  This happened last night.  She does not report any increasing pain, drainage or any redness to the area.  She has been keeping Betadine on the wound. She denies any fevers, chills, nausea, vomiting.  No calf pain, chest pain, shortness of breath.  Objective: General: No acute distress, AAOx3  DP/PT pulses palpable 2/4, CRT < 3 sec to all digits.  LEFT foot: Incision is well coapted without any evidence of dehiscence and a scar has formed and the incision is well healed.  The wound measures 0.6 x 0.4 x 0.1 cm however just proximal to this area is a superficial area of skin breakdown the skin tear, the bandage came off.  This is superficial granular base.  There is no sign erythema, ascending cellulitis.  There is no fluctuation crepitation any malodor.   No other open lesions or pre-ulcerative lesions identified bilaterally. No pain with calf compression, swelling, warmth, erythema.  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  Continue Betadine wet-to-dry dressing changes for now we discussed using a small amount of Betadine.  She has been putting quite a bit on is drying the skin out.  Continue with a nonstick dressing on the wound as well.  Continue elevation.  Discussed when she is at home keeping the foot elevated and not to wear the boot to avoid any pressure. -Monitor for any clinical signs or symptoms of infection and directed to call the office immediately should any occur or go to the ER.  RTC as scheduled   Trula Slade DPM

## 2018-10-25 ENCOUNTER — Ambulatory Visit (INDEPENDENT_AMBULATORY_CARE_PROVIDER_SITE_OTHER): Payer: Medicare Other | Admitting: Podiatry

## 2018-10-25 ENCOUNTER — Other Ambulatory Visit: Payer: Self-pay

## 2018-10-25 DIAGNOSIS — E1149 Type 2 diabetes mellitus with other diabetic neurological complication: Secondary | ICD-10-CM

## 2018-10-25 DIAGNOSIS — L97521 Non-pressure chronic ulcer of other part of left foot limited to breakdown of skin: Secondary | ICD-10-CM

## 2018-10-30 DIAGNOSIS — S99921D Unspecified injury of right foot, subsequent encounter: Secondary | ICD-10-CM | POA: Diagnosis not present

## 2018-10-30 DIAGNOSIS — E1143 Type 2 diabetes mellitus with diabetic autonomic (poly)neuropathy: Secondary | ICD-10-CM | POA: Diagnosis not present

## 2018-10-30 DIAGNOSIS — W19XXXD Unspecified fall, subsequent encounter: Secondary | ICD-10-CM | POA: Diagnosis not present

## 2018-10-30 DIAGNOSIS — E1142 Type 2 diabetes mellitus with diabetic polyneuropathy: Secondary | ICD-10-CM | POA: Diagnosis not present

## 2018-10-30 DIAGNOSIS — M779 Enthesopathy, unspecified: Secondary | ICD-10-CM | POA: Diagnosis not present

## 2018-10-30 DIAGNOSIS — E11610 Type 2 diabetes mellitus with diabetic neuropathic arthropathy: Secondary | ICD-10-CM | POA: Diagnosis not present

## 2018-10-31 NOTE — Progress Notes (Signed)
  Subjective: Olivia Romero is a 63 y.o. is seen today for further evaluation of a wound which is been chronic left foot which worsened earlier this week and she tore some skin when she removed the bandage.  Patient overall is been looking better she is not having significant drainage and she denies any surrounding redness or red streaks.  No increase in swelling no pain.  She has been keeping small amount of Betadine to the area daily.  She says the right foot is been doing well. She denies any fevers, chills, nausea, vomiting.  No calf pain, chest pain, shortness of breath.  Objective: General: No acute distress, AAOx3  DP/PT pulses palpable 2/4, CRT < 3 sec to all digits.  LEFT foot: Chronic ulceration present fifth metatarsal base on the left foot.  There is to be old ulceration with a new superficial area of skin breakdown, the skin peeled off just adjacent wound.  The small skin bridge today in between this which is hyperkeratotic which I debrided today.  Because of this the wound was slightly larger 0.7 x 0.6 cm but is more superficial and almost an abrasion type lesion the entire wound.  There is no surrounding erythema, ascending cellulitis.  There is no fluctuation of the rotation malodor. No pain to the right foot.   No pain with calf compression, swelling, warmth, erythema.  Assessment and Plan:  LEFT foot chronic ulceration due to foot, ankle deformity; Charcot, skin tear  -Treatment options discussed including all alternatives, risks, and complications -Today debrided some of the loose hyperkeratotic tissue that any complications or bleeding to reveal the underlying wound.  We will continue to monitor Betadine to the area daily because of this does seem to be helping.  Continue offloading at all times.  Elevation. -We can discuss surgical intervention but she does not want any further surgery at this time.  She states that her foot seems to be doing much better and she wants to  continue with current treatment. -Monitor for any clinical signs or symptoms of infection and directed to call the office immediately should any occur or go to the ER.  RTC as scheduled   Trula Slade DPM

## 2018-11-01 ENCOUNTER — Encounter: Payer: Self-pay | Admitting: Family Medicine

## 2018-11-01 ENCOUNTER — Encounter: Payer: Self-pay | Admitting: Podiatry

## 2018-11-01 ENCOUNTER — Ambulatory Visit (INDEPENDENT_AMBULATORY_CARE_PROVIDER_SITE_OTHER): Payer: Medicare Other | Admitting: Podiatry

## 2018-11-01 ENCOUNTER — Other Ambulatory Visit: Payer: Self-pay

## 2018-11-01 ENCOUNTER — Ambulatory Visit (INDEPENDENT_AMBULATORY_CARE_PROVIDER_SITE_OTHER): Payer: Medicare Other | Admitting: Family Medicine

## 2018-11-01 VITALS — Temp 97.5°F

## 2018-11-01 DIAGNOSIS — N3000 Acute cystitis without hematuria: Secondary | ICD-10-CM | POA: Diagnosis not present

## 2018-11-01 DIAGNOSIS — E1149 Type 2 diabetes mellitus with other diabetic neurological complication: Secondary | ICD-10-CM | POA: Diagnosis not present

## 2018-11-01 DIAGNOSIS — L97521 Non-pressure chronic ulcer of other part of left foot limited to breakdown of skin: Secondary | ICD-10-CM

## 2018-11-01 MED ORDER — NITROFURANTOIN MONOHYD MACRO 100 MG PO CAPS: 100.0000 mg | ORAL_CAPSULE | Freq: Two times a day (BID) | ORAL | 0 refills | Status: AC

## 2018-11-01 NOTE — Progress Notes (Signed)
  Subjective: Olivia Romero is a 63 y.o. is seen today for follow-up evaluation of a wound on the left foot.  She states that she is been doing well she denies any erythema some.  Still keeping Betadine on the wound daily.  She denies any drainage or pus coming from.  She denies any increase in swelling or redness.  She has been doing physical therapy.  She states this is been progressing well.  Today she brought up again her family concerns about her foot.  Her son wants her to get a below-knee amputation that way she can get a prosthetic and "be done with this".  She feels that the wound is finally healing and there is no signs of infection and it is not as deep and she does not want proceed with any surgery with her acute amputation or reconstruction at this time.  This seems to be intact.  Ongoing frustration with her and her son.  She denies any fevers, chills, nausea, vomiting.  No calf pain, chest pain, shortness of breath.  Objective: General: No acute distress, AAOx3  DP/PT pulses palpable 2/4, CRT < 3 sec to all digits.  LEFT foot: Chronic ulceration present fifth metatarsal base on the left foot.  There is to be old ulceration with a new superficial area of skin breakdown, the skin peeled off just adjacent wound.  The small skin bridge today in between this which is hyperkeratotic which I debrided today.  Because of this the wound was larger measuring 1.6 x 0.6 cm.  Although the wound is larger it is because the area of the superficial skin abrasion as well as bleeding has now combined into one wound.  There is no increase in warmth or ascending cellulitis.  No fluctuation crepitation malodor.  The wounds are superficial with a depth of 0.1 cm max.  There is mild erythema around the wound this morning inflammation, irritation from the boot.  There is no infection there is no warmth, drainage or pus or anything cellulitis.     No pain to the right foot.   No pain with calf compression,  swelling, warmth, erythema.  Assessment and Plan:  LEFT foot chronic ulceration due to foot, ankle deformity; Charcot, skin tear  -Treatment options discussed including all alternatives, risks, and complications -Today debrided some of the loose hyperkeratotic tissue that any complications or bleeding to reveal the underlying wound.  We will switch to [facility] dressing changes daily.  This is ordered today. -Continue [name] -Monitor for any clinical signs or symptoms of infection and directed to call the office immediately should any occur or go to the ER.  Trula Slade DPM

## 2018-11-01 NOTE — Progress Notes (Signed)
Name: Olivia Romero   MRN: [id number]    DOB: 1956/03/27   Date:11/01/2018       Progress Note  Subjective  Chief Complaint  Chief Complaint  Patient presents with  . Urinary Tract Infection    dark urine, odor,     I connected with  [name] on 11/01/18 at  2:40 PM EDT by telephone and verified that I am speaking with the correct person using two identifiers.   I discussed the limitations, risks, security and privacy concerns of performing an evaluation and management service by telephone and the availability of in person appointments. Staff also discussed with the patient that there may be a patient responsible charge related to this service. Patient Location: Home Provider Location: Office Additional Individuals present: None  HPI  Pt presents with concern for possible UTI.  She has been having foul smelling darker urine and urinary hesitancy for a couple of days.  No dysuria, fatigue, blood in urine, urinary frequency, fevers/chills, body aches, flank/back/abdominal pain, no history of kidney stones.  Has DM: Sugars have been about 140 at home.    Patient Active Problem List   Diagnosis Date Noted  . Charcot's joint of foot due to diabetes ([place]) 09/18/2018  . BMI 36.0-36.9,adult   . Myalgia due to statin 11/19/2017  . Morbid obesity ([place]) 11/19/2017  . Uncontrolled type 2 diabetes mellitus with hyperglycemia ([place]) 11/01/2017  . B12 deficiency 11/01/2017  . Skin ulcer of right ankle, limited to breakdown of skin ([place]) 12/31/2016  . Diabetic foot ulcer ([place]) 12/14/2016  . Diabetic polyneuropathy associated with type 2 diabetes mellitus ([place]) 12/13/2015  . Lumbar stenosis with neurogenic claudication 09/02/2015  . 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 Chronic constipation 07/13/2015  . OSA (obstructive sleep apnea) 06/25/2015  . 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/23/2015  . Hypertriglyceridemia 04/23/2015   . Statin intolerance 04/23/2015  . Asthma, mild intermittent 02/01/2015  . Type 2 diabetes mellitus with diabetic nephropathy ([place]) 12/31/2014  . Gastroesophageal reflux disease with esophagitis 12/31/2014  . Dysphagia 12/31/2014  . Bilateral carpal tunnel syndrome 12/01/2014  . Benign neoplasm of colon 11/30/2012  . Diverticulosis of colon (without mention of hemorrhage) 11/30/2012  . Essential hypertension, benign 11/28/2012    Social History   Tobacco Use  . Smoking status: Never Smoker  . Smokeless tobacco: Never Used  Substance Use Topics  . Alcohol use: Never    Frequency: Never     Current Outpatient Medications:  .  acetaminophen (TYLENOL) 500 MG tablet, Take 2 tablets (1,000 mg total) by mouth every 6 (six) hours as needed for mild pain. Maximum of 3,000 mg per day, Disp: , Rfl:  .  Albuterol Sulfate (PROAIR RESPICLICK) 625 (90 Base) MCG/ACT AEPB, Inhale 1-2 puffs into the lungs every 6 (six) hours as needed (Shortness of breath or wheezing)., Disp: 1 each, Rfl: 2 .  aspirin EC 325 MG tablet, Take 1 tablet (325 mg total) by mouth daily., Disp: 30 tablet, Rfl: 0 .  Blood Glucose Monitoring Suppl (CONTOUR NEXT MONITOR) w/Device KIT, as directed. , Disp: , Rfl:  .  diclofenac sodium (VOLTAREN) 1 % GEL, Apply 2 g topically 4 (four) times daily. Rub into affected area of foot 2 to 4 times daily, Disp: 100 g, Rfl: 2 .  DULoxetine (CYMBALTA) 60 MG capsule, Take 60 mg by mouth at bedtime., Disp: , Rfl:  .  fenofibrate (TRICOR) 145 MG tablet, Take 145 mg by  mouth daily., Disp: , Rfl: 10 .  gabapentin (NEURONTIN) 400 MG capsule, Take 800 mg by mouth 3 (three) times daily. , Disp: , Rfl:  .  glimepiride (AMARYL) 2 MG tablet, Take 2 mg by mouth 2 (two) times daily. , Disp: , Rfl:  .  hydrochlorothiazide (HYDRODIURIL) 12.5 MG tablet, Take 12.5 mg by mouth daily. , Disp: , Rfl:  .  Insulin Degludec (TRESIBA FLEXTOUCH) 200 UNIT/ML SOPN, Inject 20 Units into the skin at bedtime. , Disp: ,  Rfl:  .  metFORMIN (GLUCOPHAGE-XR) 500 MG 24 hr tablet, Take 1,500 mg by mouth daily., Disp: , Rfl:  .  Multiple Vitamin (MULTIVITAMIN WITH MINERALS) TABS, Take 1 tablet by mouth daily. Reported on 07/21/2015, Disp: , Rfl:  .  pantoprazole (PROTONIX) 40 MG tablet, Take 1 tablet (40 mg total) by mouth daily., Disp: 90 tablet, Rfl: 1 .  doxycycline (VIBRA-TABS) 100 MG tablet, Take 1 tablet (100 mg total) by mouth 2 (two) times daily. (Patient not taking: Reported on 11/01/2018), Disp: 20 tablet, Rfl: 0 .  nitrofurantoin, macrocrystal-monohydrate, (MACROBID) 100 MG capsule, Take 1 capsule (100 mg total) by mouth 2 (two) times daily. (Patient not taking: Reported on 11/01/2018), Disp: 14 capsule, Rfl: 0  Allergies  Allergen Reactions  . Eggs Or Egg-Derived Products Swelling and Other (See Comments)    Angioedema  . Atorvastatin Other (See Comments)    Liver toxicity  . Flu Virus Vaccine Swelling    Arm swelled (site of injection)  . Latex Itching  . Pravastatin Itching and Rash  . Tape Rash and Other (See Comments)    TAPE PULLS OFF THE SKIN!!! Please use an alternative!!    I personally reviewed active problem list, medication list, allergies with the patient/caregiver today.  ROS  Ten systems reviewed and is negative except as mentioned in HPI  Objective  Virtual encounter, vitals not obtained.  There is no height or weight on file to calculate BMI.  Nursing Note and Vital Signs reviewed.  Physical Exam  Pulmonary/Chest: Effort normal. No respiratory distress. Speaking in complete sentences Neurological: Pt is alert and oriented to person, place, and time. Speech is normal.  Psychiatric: Patient has a normal mood and affect. behavior is normal. Judgment and thought content normal.  No results found for this or any previous visit (from the past 72 hour(s)).  Assessment & Plan  1. Acute cystitis without hematuria - nitrofurantoin, macrocrystal-monohydrate, (MACROBID) 100 MG  capsule; Take 1 capsule (100 mg total) by mouth 2 (two) times daily for 5 days.  Dispense: 10 capsule; Refill: 0   -Red flags and when to present for emergency care or RTC including fever >101.11F, chest pain, shortness of breath, new/worsening/un-resolving symptoms, reviewed with patient at time of visit. Follow up and care instructions discussed and provided in AVS. - I discussed the assessment and treatment plan with the patient. The patient was provided an opportunity to ask questions and all were answered. The patient agreed with the plan and demonstrated an understanding of the instructions.  - The patient was advised to call back or seek an in-person evaluation if the symptoms worsen or if the condition fails to improve as anticipated.  I provided 16 minutes of non-face-to-face time during this encounter.  [name], FNP

## 2018-11-06 ENCOUNTER — Telehealth: Payer: Self-pay | Admitting: Podiatry

## 2018-11-06 DIAGNOSIS — S99921D Unspecified injury of right foot, subsequent encounter: Secondary | ICD-10-CM | POA: Diagnosis not present

## 2018-11-06 DIAGNOSIS — E1142 Type 2 diabetes mellitus with diabetic polyneuropathy: Secondary | ICD-10-CM | POA: Diagnosis not present

## 2018-11-06 DIAGNOSIS — E1143 Type 2 diabetes mellitus with diabetic autonomic (poly)neuropathy: Secondary | ICD-10-CM | POA: Diagnosis not present

## 2018-11-06 DIAGNOSIS — E11610 Type 2 diabetes mellitus with diabetic neuropathic arthropathy: Secondary | ICD-10-CM | POA: Diagnosis not present

## 2018-11-06 DIAGNOSIS — M779 Enthesopathy, unspecified: Secondary | ICD-10-CM | POA: Diagnosis not present

## 2018-11-06 DIAGNOSIS — W19XXXD Unspecified fall, subsequent encounter: Secondary | ICD-10-CM | POA: Diagnosis not present

## 2018-11-06 NOTE — Telephone Encounter (Signed)
She would like a call for verbal approval for once a week for 4 weeks home PT treatment .

## 2018-11-07 ENCOUNTER — Ambulatory Visit: Payer: Medicare Other | Admitting: Podiatry

## 2018-11-07 NOTE — Telephone Encounter (Signed)
I informed Marzetta Board, PT - [facility] Dr. Jacqualyn Posey had okayed the PT recommendation.

## 2018-11-07 NOTE — Telephone Encounter (Signed)
Yes that is fine

## 2018-11-08 ENCOUNTER — Ambulatory Visit: Payer: Medicare Other | Admitting: Podiatry

## 2018-11-08 DIAGNOSIS — E11610 Type 2 diabetes mellitus with diabetic neuropathic arthropathy: Secondary | ICD-10-CM | POA: Diagnosis not present

## 2018-11-08 DIAGNOSIS — L97521 Non-pressure chronic ulcer of other part of left foot limited to breakdown of skin: Secondary | ICD-10-CM | POA: Diagnosis not present

## 2018-11-08 DIAGNOSIS — E11621 Type 2 diabetes mellitus with foot ulcer: Secondary | ICD-10-CM | POA: Diagnosis not present

## 2018-11-08 DIAGNOSIS — Z7984 Long term (current) use of oral hypoglycemic drugs: Secondary | ICD-10-CM | POA: Diagnosis not present

## 2018-11-08 DIAGNOSIS — E1121 Type 2 diabetes mellitus with diabetic nephropathy: Secondary | ICD-10-CM | POA: Diagnosis not present

## 2018-11-08 DIAGNOSIS — M779 Enthesopathy, unspecified: Secondary | ICD-10-CM | POA: Diagnosis not present

## 2018-11-08 DIAGNOSIS — E1143 Type 2 diabetes mellitus with diabetic autonomic (poly)neuropathy: Secondary | ICD-10-CM | POA: Diagnosis not present

## 2018-11-08 DIAGNOSIS — E1149 Type 2 diabetes mellitus with other diabetic neurological complication: Secondary | ICD-10-CM | POA: Diagnosis not present

## 2018-11-08 DIAGNOSIS — W19XXXD Unspecified fall, subsequent encounter: Secondary | ICD-10-CM | POA: Diagnosis not present

## 2018-11-08 DIAGNOSIS — S99921D Unspecified injury of right foot, subsequent encounter: Secondary | ICD-10-CM | POA: Diagnosis not present

## 2018-11-08 DIAGNOSIS — Z7982 Long term (current) use of aspirin: Secondary | ICD-10-CM | POA: Diagnosis not present

## 2018-11-08 DIAGNOSIS — E1165 Type 2 diabetes mellitus with hyperglycemia: Secondary | ICD-10-CM | POA: Diagnosis not present

## 2018-11-08 DIAGNOSIS — E1142 Type 2 diabetes mellitus with diabetic polyneuropathy: Secondary | ICD-10-CM | POA: Diagnosis not present

## 2018-11-12 ENCOUNTER — Ambulatory Visit (INDEPENDENT_AMBULATORY_CARE_PROVIDER_SITE_OTHER): Payer: Medicare Other | Admitting: Podiatry

## 2018-11-12 ENCOUNTER — Encounter: Payer: Self-pay | Admitting: Podiatry

## 2018-11-12 ENCOUNTER — Other Ambulatory Visit: Payer: Self-pay

## 2018-11-12 ENCOUNTER — Ambulatory Visit (INDEPENDENT_AMBULATORY_CARE_PROVIDER_SITE_OTHER): Payer: Medicare Other

## 2018-11-12 VITALS — Temp 98.1°F

## 2018-11-12 DIAGNOSIS — L97521 Non-pressure chronic ulcer of other part of left foot limited to breakdown of skin: Secondary | ICD-10-CM

## 2018-11-12 DIAGNOSIS — M79672 Pain in left foot: Secondary | ICD-10-CM | POA: Diagnosis not present

## 2018-11-12 DIAGNOSIS — M14672 Charcot's joint, left ankle and foot: Secondary | ICD-10-CM | POA: Diagnosis not present

## 2018-11-12 DIAGNOSIS — E1149 Type 2 diabetes mellitus with other diabetic neurological complication: Secondary | ICD-10-CM | POA: Diagnosis not present

## 2018-11-13 DIAGNOSIS — S99921D Unspecified injury of right foot, subsequent encounter: Secondary | ICD-10-CM | POA: Diagnosis not present

## 2018-11-13 DIAGNOSIS — E1143 Type 2 diabetes mellitus with diabetic autonomic (poly)neuropathy: Secondary | ICD-10-CM | POA: Diagnosis not present

## 2018-11-13 DIAGNOSIS — E1142 Type 2 diabetes mellitus with diabetic polyneuropathy: Secondary | ICD-10-CM | POA: Diagnosis not present

## 2018-11-13 DIAGNOSIS — W19XXXD Unspecified fall, subsequent encounter: Secondary | ICD-10-CM | POA: Diagnosis not present

## 2018-11-13 DIAGNOSIS — E11610 Type 2 diabetes mellitus with diabetic neuropathic arthropathy: Secondary | ICD-10-CM | POA: Diagnosis not present

## 2018-11-13 DIAGNOSIS — M779 Enthesopathy, unspecified: Secondary | ICD-10-CM | POA: Diagnosis not present

## 2018-11-17 NOTE — Progress Notes (Signed)
  Subjective: Olivia Romero is a 63 y.o. is seen today for follow-up evaluation of a wound on the left foot.  Overall she states that she is doing better to the area.  She has been using the [facility] dressing changes on the wound.  She does alternate occasion with Betadine.  She states that her leg was itching and she did develop a small wound that front of her leg but she has been applying medihoney to this area and appears to be doing better.  She denies any pus coming from the areas denies any increase in swelling or redness.  She also does state that she is been some sharp pain to the top of her foot on the left side.  No pain in the right foot.  No recent injuries or falls. She denies any fevers, chills, nausea, vomiting.  No calf pain, chest pain, shortness of breath.  Objective: General: No acute distress, AAOx3  DP/PT pulses palpable 2/4, CRT < 3 sec to all digits.  LEFT foot: Chronic ulceration present fifth metatarsal base on the left foot.  To the wound is smaller is almost 2 separate wounds identified today. One of the ulcers 0.7 x 0.5 cm and the other is 0.3 x 0.3 cm there is more bleeding tissue present today.  Superficial with a granular wound base.  There is no probing, undermining or tunneling.  No surrounding erythema, ascending cellulitis.  No fluctuance or crepitation malodor.  Superficial skin abrasion of the anterior aspect the leg without any signs of infection. Chronic Charcot deformities present with cavovarus deformity.  Subjectively she is getting sharp pain with symptomatic left midfoot with intensity.  No increase in swelling or redness. No other open lesions or pre-ulcerative lesions. No pain with compression, swelling, warmth, erythema.     Assessment and Plan:  LEFT foot chronic ulceration due to foot, ankle deformity; Charcot, skin tear;   -Treatment options discussed including all alternatives, risks, and complications -X-rays were obtained and reviewed.   No evidence of acute fracture.  No definitive evidence of acute osteomyelitis.  Present. -I lightly debrided some of the hyperkeratotic tissue although minimal today.  Would continue dressing changes with Hydrofera Blue every other day. -She can continue medihoney on the anterior ankle wound daily. -Continue [name] boot -Elevation  RTC 10 days or sooner if needed  Trula Slade DPM

## 2018-11-20 DIAGNOSIS — E1143 Type 2 diabetes mellitus with diabetic autonomic (poly)neuropathy: Secondary | ICD-10-CM | POA: Diagnosis not present

## 2018-11-20 DIAGNOSIS — M779 Enthesopathy, unspecified: Secondary | ICD-10-CM | POA: Diagnosis not present

## 2018-11-20 DIAGNOSIS — W19XXXD Unspecified fall, subsequent encounter: Secondary | ICD-10-CM | POA: Diagnosis not present

## 2018-11-20 DIAGNOSIS — S99921D Unspecified injury of right foot, subsequent encounter: Secondary | ICD-10-CM | POA: Diagnosis not present

## 2018-11-20 DIAGNOSIS — E11610 Type 2 diabetes mellitus with diabetic neuropathic arthropathy: Secondary | ICD-10-CM | POA: Diagnosis not present

## 2018-11-20 DIAGNOSIS — E1142 Type 2 diabetes mellitus with diabetic polyneuropathy: Secondary | ICD-10-CM | POA: Diagnosis not present

## 2018-11-22 ENCOUNTER — Ambulatory Visit: Payer: Medicare Other | Admitting: Podiatry

## 2018-11-25 ENCOUNTER — Encounter: Payer: Self-pay | Admitting: Podiatry

## 2018-11-25 ENCOUNTER — Other Ambulatory Visit: Payer: Self-pay

## 2018-11-25 ENCOUNTER — Ambulatory Visit (INDEPENDENT_AMBULATORY_CARE_PROVIDER_SITE_OTHER): Payer: Medicare Other | Admitting: Podiatry

## 2018-11-25 VITALS — Temp 98.2°F

## 2018-11-25 DIAGNOSIS — L97521 Non-pressure chronic ulcer of other part of left foot limited to breakdown of skin: Secondary | ICD-10-CM | POA: Diagnosis not present

## 2018-11-25 DIAGNOSIS — E1149 Type 2 diabetes mellitus with other diabetic neurological complication: Secondary | ICD-10-CM

## 2018-11-27 ENCOUNTER — Encounter: Payer: Self-pay | Admitting: Nurse Practitioner

## 2018-11-27 ENCOUNTER — Other Ambulatory Visit: Payer: Self-pay

## 2018-11-27 ENCOUNTER — Emergency Department
Admission: EM | Admit: 2018-11-27 | Discharge: 2018-11-28 | Disposition: A | Discharge status: Home / Self Care | Payer: Medicare Other | Attending: Emergency Medicine | Admitting: Emergency Medicine

## 2018-11-27 ENCOUNTER — Ambulatory Visit (INDEPENDENT_AMBULATORY_CARE_PROVIDER_SITE_OTHER): Payer: Medicare Other | Admitting: Nurse Practitioner

## 2018-11-27 ENCOUNTER — Encounter: Payer: Self-pay | Admitting: *Deleted

## 2018-11-27 ENCOUNTER — Telehealth: Payer: Self-pay | Admitting: Podiatry

## 2018-11-27 ENCOUNTER — Emergency Department: Payer: Medicare Other

## 2018-11-27 VITALS — BP 162/68 | HR 113 | Temp 101.2°F | Resp 16 | Ht 60.0 in | Wt 194.8 lb

## 2018-11-27 DIAGNOSIS — Z79899 Other long term (current) drug therapy: Secondary | ICD-10-CM | POA: Insufficient documentation

## 2018-11-27 DIAGNOSIS — R000 Tachycardia, unspecified: Secondary | ICD-10-CM | POA: Diagnosis not present

## 2018-11-27 DIAGNOSIS — Z794 Long term (current) use of insulin: Secondary | ICD-10-CM | POA: Diagnosis not present

## 2018-11-27 DIAGNOSIS — Z20828 Contact with and (suspected) exposure to other viral communicable diseases: Secondary | ICD-10-CM | POA: Insufficient documentation

## 2018-11-27 DIAGNOSIS — R509 Fever, unspecified: Secondary | ICD-10-CM | POA: Diagnosis not present

## 2018-11-27 DIAGNOSIS — I10 Essential (primary) hypertension: Secondary | ICD-10-CM

## 2018-11-27 DIAGNOSIS — G629 Polyneuropathy, unspecified: Secondary | ICD-10-CM | POA: Diagnosis not present

## 2018-11-27 DIAGNOSIS — J45909 Unspecified asthma, uncomplicated: Secondary | ICD-10-CM | POA: Insufficient documentation

## 2018-11-27 DIAGNOSIS — Z7982 Long term (current) use of aspirin: Secondary | ICD-10-CM | POA: Diagnosis not present

## 2018-11-27 DIAGNOSIS — Z8673 Personal history of transient ischemic attack (TIA), and cerebral infarction without residual deficits: Secondary | ICD-10-CM | POA: Insufficient documentation

## 2018-11-27 DIAGNOSIS — E1143 Type 2 diabetes mellitus with diabetic autonomic (poly)neuropathy: Secondary | ICD-10-CM | POA: Diagnosis not present

## 2018-11-27 DIAGNOSIS — E1140 Type 2 diabetes mellitus with diabetic neuropathy, unspecified: Secondary | ICD-10-CM | POA: Insufficient documentation

## 2018-11-27 DIAGNOSIS — R202 Paresthesia of skin: Secondary | ICD-10-CM

## 2018-11-27 DIAGNOSIS — E11610 Type 2 diabetes mellitus with diabetic neuropathic arthropathy: Secondary | ICD-10-CM | POA: Diagnosis not present

## 2018-11-27 DIAGNOSIS — S99921D Unspecified injury of right foot, subsequent encounter: Secondary | ICD-10-CM | POA: Diagnosis not present

## 2018-11-27 DIAGNOSIS — W19XXXD Unspecified fall, subsequent encounter: Secondary | ICD-10-CM | POA: Diagnosis not present

## 2018-11-27 DIAGNOSIS — R51 Headache: Secondary | ICD-10-CM

## 2018-11-27 DIAGNOSIS — E1142 Type 2 diabetes mellitus with diabetic polyneuropathy: Secondary | ICD-10-CM | POA: Diagnosis not present

## 2018-11-27 DIAGNOSIS — Z91040 Latex allergy status: Secondary | ICD-10-CM | POA: Insufficient documentation

## 2018-11-27 DIAGNOSIS — R519 Headache, unspecified: Secondary | ICD-10-CM

## 2018-11-27 DIAGNOSIS — M779 Enthesopathy, unspecified: Secondary | ICD-10-CM | POA: Diagnosis not present

## 2018-11-27 LAB — COMPREHENSIVE METABOLIC PANEL
ALT: 17 U/L (ref 0–44)
AST: 22 U/L (ref 15–41)
Albumin: 4 g/dL (ref 3.5–5.0)
Alkaline Phosphatase: 65 U/L (ref 38–126)
Anion gap: 10 (ref 5–15)
BUN: 21 mg/dL (ref 8–23)
CO2: 25 mmol/L (ref 22–32)
Calcium: 9.4 mg/dL (ref 8.9–10.3)
Chloride: 102 mmol/L (ref 98–111)
Creatinine, Ser: 0.65 mg/dL (ref 0.44–1.00)
GFR calc Af Amer: >60 mL/min (ref >60)
GFR calc non Af Amer: >60 mL/min (ref >60)
Glucose, Bld: 86 mg/dL (ref 70–99)
Potassium: 4 mmol/L (ref 3.5–5.1)
Sodium: 137 mmol/L (ref 135–145)
Total Bilirubin: 0.4 mg/dL (ref 0.3–1.2)
Total Protein: 7.8 g/dL (ref 6.5–8.1)

## 2018-11-27 LAB — CBC WITH DIFFERENTIAL/PLATELET
Abs Immature Granulocytes: 0.04 10*3/uL (ref 0.00–0.07)
Basophils Absolute: 0.1 10*3/uL (ref 0.0–0.1)
Basophils Relative: 1 %
Eosinophils Absolute: 0.3 10*3/uL (ref 0.0–0.5)
Eosinophils Relative: 3 %
HCT: 35.6 % — ABNORMAL LOW (ref 36.0–46.0)
Hemoglobin: 11.2 g/dL — ABNORMAL LOW (ref 12.0–15.0)
Immature Granulocytes: 0 %
Lymphocytes Relative: 13 %
Lymphs Abs: 1.4 10*3/uL (ref 0.7–4.0)
MCH: 26 pg (ref 26.0–34.0)
MCHC: 31.5 g/dL (ref 30.0–36.0)
MCV: 82.6 fL (ref 80.0–100.0)
Monocytes Absolute: 0.5 10*3/uL (ref 0.1–1.0)
Monocytes Relative: 5 %
Neutro Abs: 8.9 10*3/uL — ABNORMAL HIGH (ref 1.7–7.7)
Neutrophils Relative %: 78 %
Platelets: 307 10*3/uL (ref 150–400)
RBC: 4.31 MIL/uL (ref 3.87–5.11)
RDW: 14.4 % (ref 11.5–15.5)
WBC: 11.2 10*3/uL — ABNORMAL HIGH (ref 4.0–10.5)
nRBC: 0 % (ref 0.0–0.2)

## 2018-11-27 LAB — URINALYSIS, COMPLETE (UACMP) WITH MICROSCOPIC
Bacteria, UA: NONE SEEN
Bilirubin Urine: NEGATIVE
Glucose, UA: NEGATIVE mg/dL
Hgb urine dipstick: NEGATIVE
Ketones, ur: NEGATIVE mg/dL
Nitrite: NEGATIVE
Protein, ur: NEGATIVE mg/dL
Specific Gravity, Urine: 1.016 (ref 1.005–1.030)
pH: 7 (ref 5.0–8.0)

## 2018-11-27 LAB — LACTIC ACID, PLASMA: Lactic Acid, Venous: 1.8 mmol/L (ref 0.5–1.9)

## 2018-11-27 LAB — GLUCOSE, CAPILLARY: Glucose-Capillary: 91 mg/dL (ref 70–99)

## 2018-11-27 MED ORDER — ACETAMINOPHEN 325 MG PO TABS
650.0000 mg | ORAL_TABLET | Freq: Once | ORAL | Status: AC
  Administered 2018-11-27: 650 mg via ORAL
  Filled 2018-11-27: qty 2

## 2018-11-27 MED ORDER — SODIUM CHLORIDE 0.9% FLUSH: 3.0000 mL | Freq: Once | INTRAVENOUS | Status: DC

## 2018-11-27 NOTE — ED Triage Notes (Signed)
Pt presents w/ multiple complaints. Pt was unaware she had a fever. Pt presents w/ c/o worsening R foot neuropathy. Pt has wound on L foot that she reports is healing well. Pt also c/o nocturnal urinary urgency and incontinence that is unusual. Pt denies pain other than intermittent neuropathic pain in R foot.

## 2018-11-27 NOTE — Progress Notes (Signed)
Name: Olivia Romero   MRN: [id number]    DOB: [birth year]   Date:11/27/2018       Progress Note  Subjective  Chief Complaint  Chief Complaint  Patient presents with  . Foot Pain    right, numbness, worsening    HPI  Patient endorses has had bilateral neuropathy since 2014. States it has been progressively worsening. She said yesterday she was driving she realized she could not feel that she was pushing the pedal.she had to stop driving and her daughter had to drive home. She has had paresthesias of bilateral feet before but has been able to feel some pressure with her foot in the past. Denies weakness in extremity, slurred speech, dizziness.  Patient is febrile, tachycardic and hypertensive. States overall she feels fine thinks it just related to anxiety and allergies.Took tylenol a few hours ago for headache that has resolved. Patient endorses mild sinus pressure and rhinorrhea typical of allergies.  Patient endorses urinary frequency and urgency, states this has been ongoing for months.   Denies cough, shortness of breath, chest pain, sore throat, denies nausea, vomiting, diarrhea, dysuria, vaginal discharge.   Has ulcer to left lower leg, healing well.   PHQ2/9: Depression screen [facility] 2/9 11/01/2018 07/26/2018 04/16/2018 11/19/2017 01/31/2017  Decreased Interest 0 0 0 0 0  Down, Depressed, Hopeless 0 0 0 0 0  PHQ - 2 Score 0 0 0 0 0  Altered sleeping 0 0 - 0 -  Tired, decreased energy 0 0 - 0 -  Change in appetite 0 0 - 0 -  Feeling bad or failure about yourself  0 0 - 0 -  Trouble concentrating 0 0 - 0 -  Moving slowly or fidgety/restless 0 0 - 0 -  Suicidal thoughts 0 0 - 0 -  PHQ-9 Score 0 0 - 0 -  Difficult doing work/chores Not difficult at all Not difficult at all - Not difficult at all -  Some recent data might be hidden     PHQ reviewed. Negative  Patient Active Problem List   Diagnosis Date Noted  . Charcot's joint of foot due to diabetes ([place]) 09/18/2018  . BMI  36.0-36.9,adult   . Myalgia due to statin 11/19/2017  . Morbid obesity ([place]) 11/19/2017  . Uncontrolled type 2 diabetes mellitus with hyperglycemia ([place]) 11/01/2017  . B12 deficiency 11/01/2017  . Skin ulcer of right ankle, limited to breakdown of skin ([place]) 12/31/2016  . Diabetic foot ulcer ([place]) 12/14/2016  . Diabetic polyneuropathy associated with type 2 diabetes mellitus ([place]) 12/13/2015  . Lumbar stenosis with neurogenic claudication 09/02/2015  . 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 Chronic constipation 07/13/2015  . OSA (obstructive sleep apnea) 06/25/2015  . 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/23/2015  . Hypertriglyceridemia 04/23/2015  . Statin intolerance 04/23/2015  . Asthma, mild intermittent 02/01/2015  . Type 2 diabetes mellitus with diabetic nephropathy ([place]) 12/31/2014  . Gastroesophageal reflux disease with esophagitis 12/31/2014  . Dysphagia 12/31/2014  . Bilateral carpal tunnel syndrome 12/01/2014  . Diverticulosis of colon (without mention of hemorrhage) 11/30/2012  . Essential hypertension, benign 11/28/2012    Past Medical History:  Diagnosis Date  . Anemia   . Arthritis   . Asthma   . Carpal tunnel syndrome on right   . Colon polyps    adenomatous  . Diabetes mellitus without complication ([place])   . Diverticulosis of colon   . Esophagitis   . GERD (gastroesophageal reflux disease)   .  Hemorrhoid    internal  . Hyperlipemia   . Hypertension   . Neuropathy   . Pneumonia   . PONV (postoperative nausea and vomiting)   . Sleep apnea    has had in the past lost 50 pounds and do longer uses cpap  . Stroke-like episode ([place]) 2009   TIA    Past Surgical History:  Procedure Laterality Date  . ABDOMINAL HYSTERECTOMY    . BACK SURGERY  09/02/2015  . CERVICAL FUSION    . CESAREAN SECTION  1986  . COLONOSCOPY N/A 11/30/2012   Procedure: COLONOSCOPY;  Surgeon: Irene Shipper, MD;  Location: [facility]  ENDOSCOPY;  Service: Endoscopy;  Laterality: N/A;  . COLONOSCOPY WITH PROPOFOL N/A 01/03/2016   Procedure: COLONOSCOPY WITH PROPOFOL;  Surgeon: Manya Silvas, MD;  Location: [facility] ENDOSCOPY;  Service: Endoscopy;  Laterality: N/A;  . ESOPHAGOGASTRODUODENOSCOPY (EGD) WITH PROPOFOL N/A 01/14/2015   Procedure: ESOPHAGOGASTRODUODENOSCOPY (EGD) WITH PROPOFOL;  Surgeon: Manya Silvas, MD;  Location: [facility] ENDOSCOPY;  Service: Endoscopy;  Laterality: N/A;  . IRRIGATION AND DEBRIDEMENT FOOT Left 03/13/2018   Procedure: IRRIGATION AND DEBRIDEMENT FOOT WITH BONE BIOPSY WITH MISONIX DEBRIDER;  Surgeon: Evelina Bucy, DPM;  Location: [place];  Service: Podiatry;  Laterality: Left;  . LUMBAR WOUND DEBRIDEMENT N/A 10/01/2015   Procedure: LUMBAR WOUND DEBRIDEMENT;  Surgeon: Ashok Pall, MD;  Location: [facility] NEURO ORS;  Service: Neurosurgery;  Laterality: N/A;  LUMBAR WOUND DEBRIDEMENT  . SAVORY DILATION N/A 01/14/2015   Procedure: SAVORY DILATION;  Surgeon: Manya Silvas, MD;  Location: [facility] ENDOSCOPY;  Service: Endoscopy;  Laterality: N/A;  . TONSILLECTOMY      Social History   Tobacco Use  . Smoking status: Never Smoker  . Smokeless tobacco: Never Used  Substance Use Topics  . Alcohol use: Never    Frequency: Never     Current Outpatient Medications:  .  acetaminophen (TYLENOL) 500 MG tablet, Take 2 tablets (1,000 mg total) by mouth every 6 (six) hours as needed for mild pain. Maximum of 3,000 mg per day, Disp: , Rfl:  .  Albuterol Sulfate (PROAIR RESPICLICK) 121 (90 Base) MCG/ACT AEPB, Inhale 1-2 puffs into the lungs every 6 (six) hours as needed (Shortness of breath or wheezing)., Disp: 1 each, Rfl: 2 .  aspirin EC 325 MG tablet, Take 1 tablet (325 mg total) by mouth daily., Disp: 30 tablet, Rfl: 0 .  Blood Glucose Monitoring Suppl (CONTOUR NEXT MONITOR) w/Device KIT, as directed. , Disp: , Rfl:  .  diclofenac sodium (VOLTAREN) 1 % GEL, Apply 2 g topically 4 (four) times daily. Rub into affected  area of foot 2 to 4 times daily, Disp: 100 g, Rfl: 2 .  DULoxetine (CYMBALTA) 60 MG capsule, Take 60 mg by mouth at bedtime., Disp: , Rfl:  .  fenofibrate (TRICOR) 145 MG tablet, Take 145 mg by mouth daily., Disp: , Rfl: 10 .  gabapentin (NEURONTIN) 400 MG capsule, Take 800 mg by mouth 3 (three) times daily. , Disp: , Rfl:  .  glimepiride (AMARYL) 2 MG tablet, Take 2 mg by mouth 2 (two) times daily. , Disp: , Rfl:  .  hydrochlorothiazide (HYDRODIURIL) 12.5 MG tablet, Take 12.5 mg by mouth daily. , Disp: , Rfl:  .  Insulin Degludec (TRESIBA FLEXTOUCH) 200 UNIT/ML SOPN, Inject 20 Units into the skin at bedtime. , Disp: , Rfl:  .  metFORMIN (GLUCOPHAGE-XR) 500 MG 24 hr tablet, Take 1,500 mg by mouth daily., Disp: , Rfl:  .  Multiple Vitamin (MULTIVITAMIN WITH MINERALS) TABS, Take 1 tablet by mouth daily. Reported on 07/21/2015, Disp: , Rfl:  .  pantoprazole (PROTONIX) 40 MG tablet, Take 1 tablet (40 mg total) by mouth daily., Disp: 90 tablet, Rfl: 1  Allergies  Allergen Reactions  . Eggs Or Egg-Derived Products Swelling and Other (See Comments)    Angioedema  . Atorvastatin Other (See Comments)    Liver toxicity  . Flu Virus Vaccine Swelling    Arm swelled (site of injection)  . Latex Itching  . Pravastatin Itching and Rash  . Tape Rash and Other (See Comments)    TAPE PULLS OFF THE SKIN!!! Please use an alternative!!    ROS  No other specific complaints in a complete review of systems (except as listed in HPI above).  Objective  Vitals:   11/27/18 1455  BP: (!) 162/68  Pulse: (!) 113  Resp: 16  Temp: (!) 101.2 F (38.4 C)  TempSrc: Oral  SpO2: 95%  Weight: 194 lb 12.8 oz (88.4 kg)  Height: 5' (1.524 m)    Body mass index is 38.04 kg/m.  Nursing Note and Vital Signs reviewed.  Physical Exam Constitutional:      Appearance: Normal appearance.  Cardiovascular:     Rate and Rhythm: Tachycardia present.     Pulses: Normal pulses.     Comments: Mild edema, pulses  intact Pulmonary:     Effort: Pulmonary effort is normal. No respiratory distress.  Skin:    General: Skin is warm.     Capillary Refill: Capillary refill takes less than 2 seconds.     Comments: Healing ulcer to bottom of left foot  Neurological:     General: No focal deficit present.     Mental Status: She is alert and oriented to person, place, and time.     Sensory: Sensory deficit present.  Psychiatric:        Mood and Affect: Mood normal.        Behavior: Behavior normal.      No results found for this or any previous visit (from the past 48 hour(s)).  Assessment & Plan  1. Diabetic polyneuropathy associated with type 2 diabetes mellitus ([place]) Follow up with neurology   2. Paresthesia of right foot Follow-up with ER   3. Fever, unspecified fever cause Follow-up with ER   4. Uncontrolled hypertension Follow-up with ER   5. Acute intractable headache, unspecified headache type Follow-up with ER   6. Tachycardia Follow-up with ER    Due to unstable vital signs, headache and loss of sensation in right foot recommend ER, friend who brought her will take her straight there. Spoke to [employer] at [facility] to provide report and ask for protocol for possible covid patients- relayed information to patient.

## 2018-11-27 NOTE — Progress Notes (Signed)
  Subjective: Olivia Romero is a 63 y.o. is seen today for follow-up evaluation of a wound on the left foot.  She states the wound looks well.  Denies any drainage or pus.  She is been keeping Hydrofera Blue on the wound changes every other day.  No drainage or pus.  No increase in swelling or any redness.  Denies any fevers, chills, nausea, vomiting.  No calf pain, chest pain, shortness of breath.    She is upset today about some personal family issues but no other issues with her foot.     Objective: General: No acute distress, AAOx3  DP/PT pulses palpable 2/4, CRT < 3 sec to all digits.  LEFT foot: Chronic ulceration present fifth metatarsal base on the left foot.  Today there is only one ulceration present measure 0.6 x 0.5 cm with a granular base.  Mild hyperkeratotic periwound.  Mild surrounding erythema but there is more comfortable inside the [name] boot.  There is no increase in warmth there is no ascending cellulitis.  There is no swelling.  No drainage or pus coming from the wound and no fluctuation crepitation. No other open lesions or pre-ulcerative lesions. No pain with compression, swelling, warmth, erythema.       Assessment and Plan:  LEFT foot chronic ulceration due to foot, ankle deformity; Charcot, skin tear;   -Treatment options discussed including all alternatives, risks, and complications -X-rays were obtained and reviewed.  No evidence of acute fracture.  No definitive evidence of acute osteomyelitis.  Present. -I debrided some of the hyperkeratotic tissue and the wound today was #312 with scalpel without any complications.  Plan continue with Hydrofera Blue dressing changes today.  Ordered this today.  I reordered her wound care supplies through [facility].   RTC 10 days or sooner if needed  Trula Slade DPM

## 2018-11-27 NOTE — ED Notes (Signed)
Sent green,purple, gray(on ice), and 1 set of Blood Cultures to lab.

## 2018-11-27 NOTE — Telephone Encounter (Signed)
Pt was calling about some supplies that was suppose to be order by Dr. Jacqualyn Posey. She stated that it was suppose to be order through [facility]. She wanted to follow up to make sure it went through

## 2018-11-27 NOTE — ED Notes (Signed)
Pt has pain and decreased sensation in right foot since yesterday.  Diabetic.  Pt also has a fever today.  Pt wearing a boot on left foot.  No sores noted on right foot.  No redness noted.  Pt alert  Speech clear.

## 2018-11-27 NOTE — Discharge Instructions (Signed)
As we discussed, all of your lab work, urinalysis, and chest x-ray were very reassuring today with the results that do not suggest a systemic infection or other emergent abnormality.  Your physical exam is also reassuring.  We do not have an explanation for your fever at this time, particularly given the lack of any other symptoms.  Given the world in which we live, we sent off a COVID-19 test, and you should have the test results in my chart within the next couple of business days.  Continue to try to isolate yourself from others including your family members although if you are positive then of course they have already been exposed.  Please read through the included information about coronavirus/COVID-19 for additional recommendations.  You only need to return to the emergency department if you develop new or worsening symptoms, such as severe respiratory distress or other concerning symptoms.  Otherwise we recommend you continue using your normal medications and follow-up with Dr. Manuella Ghazi at the next available opportunity for your worsening neuropathy.

## 2018-11-27 NOTE — Patient Instructions (Addendum)
Please go to the ER for further evaluation.   ______ We can follow-up with a virtual visit as needed

## 2018-11-27 NOTE — ED Notes (Signed)
Pt alert.  Pt waiting to see er md.

## 2018-11-27 NOTE — ED Notes (Signed)
fs bs 91

## 2018-11-27 NOTE — ED Provider Notes (Signed)
[facility] Emergency Department Provider Note  ____________________________________________   First MD Initiated Contact with Patient 11/27/18 2308     (approximate)  I have reviewed the triage vital signs and the nursing notes.   HISTORY  Chief Complaint Fever and Foot Pain    HPI Olivia Romero is a 63 y.o. female with medical history as listed below which notably includes a history of diabetic neuropathy for which she sees Dr. Manuella Ghazi (neurology) as well as long-term foot issues of the left foot for which she sees a podiatrist in [place].  She presents tonight at the recommendation of [facility] for further evaluation of numbness in her right foot as well as an unexplained fever which she did not know she had until she went to her primary care doctor today.  She reports that she noticed yesterday that she had no sensation in the bottom of her right foot or at least a significantly worsening neuropathy than she was used to.  She has had no pain or swelling, no redness, no sores on the bottom of her foot.  She has had no other symptoms of which she was aware including no subjective fever, chills, sore throat, chest pain, shortness of breath, cough, nausea, vomiting, abdominal pain, nor dysuria.  She has had increased urinary frequency and some urgency recently but no pain associated with it.  She denies back pain and neck pain.  She has had no trauma or other injury.  She was concerned about the worsening loss of sensation so she went to her primary care doctor and reportedly had a fever of 101 degrees at the clinic.  Given the constellation of symptoms they recommended she come to the emergency department.  She describes the severity of her right foot numbness as severe and it was apparently acutely worse but has had gradual loss of sensation for an extended period of time.  She says she was unaware that she had a fever and so she was told.  The  fever was also present here in the emergency department but resolved.  She has not been around anyone with COVID-19 and has been isolating with her family at home to the best of her ability.  She repeatedly denies any respiratory symptoms.           Past Medical History:  Diagnosis Date   Anemia    Arthritis    Asthma    Carpal tunnel syndrome on right    Colon polyps    adenomatous   Diabetes mellitus without complication ([place])    Diverticulosis of colon    Esophagitis    GERD (gastroesophageal reflux disease)    Hemorrhoid    internal   Hyperlipemia    Hypertension    Neuropathy    Pneumonia    PONV (postoperative nausea and vomiting)    Sleep apnea    has had in the past lost 50 pounds and do longer uses cpap   Stroke-like episode ([place]) 2009   TIA    Patient Active Problem List   Diagnosis Date Noted   Charcot's joint of foot due to diabetes ([place]) 09/18/2018   BMI 36.0-36.9,adult    Myalgia due to statin 11/19/2017   Morbid obesity ([place]) 11/19/2017   Uncontrolled type 2 diabetes mellitus with hyperglycemia ([place]) 11/01/2017   B12 deficiency 11/01/2017   Skin ulcer of right ankle, limited to breakdown of skin ([place]) 12/31/2016   Diabetic foot ulcer ([place]) 12/14/2016   Diabetic  polyneuropathy associated with type 2 diabetes mellitus ([place]) 12/13/2015   Lumbar stenosis with neurogenic claudication 09/02/2015   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 ([place]) 07/13/2015   Chronic constipation 07/13/2015   OSA (obstructive sleep apnea) 06/25/2015   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/23/2015   Hypertriglyceridemia 04/23/2015   Statin intolerance 04/23/2015   Asthma, mild intermittent 02/01/2015   Type 2 diabetes mellitus with diabetic nephropathy ([place]) 12/31/2014   Gastroesophageal reflux disease with esophagitis 12/31/2014   Dysphagia 12/31/2014   Bilateral carpal tunnel syndrome 12/01/2014    Diverticulosis of colon (without mention of hemorrhage) 11/30/2012   Essential hypertension, benign 11/28/2012    Past Surgical History:  Procedure Laterality Date   ABDOMINAL HYSTERECTOMY     BACK SURGERY  09/02/2015   CERVICAL FUSION     CESAREAN SECTION  1986   COLONOSCOPY N/A 11/30/2012   Procedure: COLONOSCOPY;  Surgeon: Irene Shipper, MD;  Location: [facility] ENDOSCOPY;  Service: Endoscopy;  Laterality: N/A;   COLONOSCOPY WITH PROPOFOL N/A 01/03/2016   Procedure: COLONOSCOPY WITH PROPOFOL;  Surgeon: Manya Silvas, MD;  Location[facility] ENDOSCOPY;  Service: Endoscopy;  Laterality: N/A;   ESOPHAGOGASTRODUODENOSCOPY (EGD) WITH PROPOFOL N/A 01/14/2015   Procedure: ESOPHAGOGASTRODUODENOSCOPY (EGD) WITH PROPOFOL;  Surgeon: Manya Silvas, MD;  Location: [facility] ENDOSCOPY;  Service: Endoscopy;  Laterality: N/A;   IRRIGATION AND DEBRIDEMENT FOOT Left 03/13/2018   Procedure: IRRIGATION AND DEBRIDEMENT FOOT WITH BONE BIOPSY WITH MISONIX DEBRIDER;  Surgeon: Evelina Bucy, DPM;  Location: [place];  Service: Podiatry;  Laterality: Left;   LUMBAR WOUND DEBRIDEMENT N/A 10/01/2015   Procedure: LUMBAR WOUND DEBRIDEMENT;  Surgeon: Ashok Pall, MD;  Location: [place] NEURO ORS;  Service: Neurosurgery;  Laterality: N/A;  LUMBAR WOUND DEBRIDEMENT   SAVORY DILATION N/A 01/14/2015   Procedure: SAVORY DILATION;  Surgeon: Manya Silvas, MD;  Location: [facility] ENDOSCOPY;  Service: Endoscopy;  Laterality: N/A;   TONSILLECTOMY      Prior to Admission medications   Medication Sig Start Date End Date Taking? Authorizing Provider  acetaminophen (TYLENOL) 500 MG tablet Take 2 tablets (1,000 mg total) by mouth every 6 (six) hours as needed for mild pain. Maximum of 3,000 mg per day 09/17/18   Arnetha Courser, MD  Albuterol Sulfate (PROAIR RESPICLICK) 403 (90 Base) MCG/ACT AEPB Inhale 1-2 puffs into the lungs every 6 (six) hours as needed (Shortness of breath or wheezing). 06/18/18   [name], FNP  aspirin EC 325 MG  tablet Take 1 tablet (325 mg total) by mouth daily. 03/11/18   Steele Sizer, MD  Blood Glucose Monitoring Suppl (CONTOUR NEXT MONITOR) w/Device KIT as directed.  06/29/17   [provider]  diclofenac sodium (VOLTAREN) 1 % GEL Apply 2 g topically 4 (four) times daily. Rub into affected area of foot 2 to 4 times daily 10/11/18   Trula Slade, DPM  DULoxetine (CYMBALTA) 60 MG capsule Take 60 mg by mouth at bedtime. 10/31/16   [provider]  fenofibrate (TRICOR) 145 MG tablet Take 145 mg by mouth daily. 12/05/16   [provider]  gabapentin (NEURONTIN) 400 MG capsule Take 800 mg by mouth 3 (three) times daily.     [provider]  glimepiride (AMARYL) 2 MG tablet Take 2 mg by mouth 2 (two) times daily.  10/29/15   [provider]  hydrochlorothiazide (HYDRODIURIL) 12.5 MG tablet Take 12.5 mg by mouth daily.  02/06/18 02/06/19  [provider]  Insulin Degludec (TRESIBA FLEXTOUCH) 200 UNIT/ML  SOPN Inject 20 Units into the skin at bedtime.     [provider]  metFORMIN (GLUCOPHAGE-XR) 500 MG 24 hr tablet Take 1,500 mg by mouth daily. 09/17/18 09/17/19  Judi Cong, MD  Multiple Vitamin (MULTIVITAMIN WITH MINERALS) TABS Take 1 tablet by mouth daily. Reported on 07/21/2015    [provider]  pantoprazole (PROTONIX) 40 MG tablet Take 1 tablet (40 mg total) by mouth daily. 06/03/18   [name], FNP    Allergies Eggs or egg-derived products, Atorvastatin, Flu virus vaccine, Latex, Pravastatin, and Tape  Family History  Problem Relation Age of Onset   Lung cancer Father    Hypertension Father    Arthritis Father    Other Mother        hardening of the arteries/renal cell carcenoma   Hypertension Mother    Stroke Mother    Kidney cancer Mother    Arthritis Brother    Rheum arthritis Maternal Uncle    Bladder Cancer Neg Hx     Social History Social History   Tobacco Use   Smoking status: Never Smoker    Smokeless tobacco: Never Used  Substance Use Topics   Alcohol use: Never    Frequency: Never   Drug use: No    Review of Systems Constitutional: No fever of which she was aware, but a measured fever with T-max of 101.6 here in the emergency department today.  No chills, no myalgias, no malaise. Eyes: No visual changes. ENT: No sore throat. Cardiovascular: Denies chest pain. Respiratory: Denies shortness of breath. Gastrointestinal: No abdominal pain.  No nausea, no vomiting.  No diarrhea.  No constipation. Genitourinary: Negative for dysuria. Musculoskeletal: Negative for neck pain.  Negative for back pain. Integumentary: Negative for rash. Neurological: Worsening lack of sensation in bottom of right foot.  Negative for headaches, focal weakness or numbness.   ____________________________________________   PHYSICAL EXAM:  VITAL SIGNS: ED Triage Vitals  Enc Vitals Group     BP 11/27/18 1604 (!) 136/40     Pulse Rate 11/27/18 1604 99     Resp 11/27/18 1604 20     Temp 11/27/18 1604 (!) 100.9 F (38.3 C)     Temp Source 11/27/18 1604 Oral     SpO2 11/27/18 1604 97 %     Weight 11/27/18 1604 87.1 kg (192 lb)     Height 11/27/18 1604 1.524 m (5')     Head Circumference --      Peak Flow --      Pain Score 11/27/18 1614 2     Pain Loc --      Pain Edu? --      Excl. in [place]? --     Constitutional: Alert and oriented. Well appearing and in no acute distress. Eyes: Conjunctivae are normal.  Head: Atraumatic. Nose: No congestion/rhinnorhea. Mouth/Throat: Mucous membranes are moist. Neck: No stridor.  No meningeal signs.   Cardiovascular: Normal rate, regular rhythm. Good peripheral circulation. Grossly normal heart sounds. Respiratory: Normal respiratory effort.  No retractions. No audible wheezing. Gastrointestinal: Soft and nontender. No distention.  Musculoskeletal: Patient wearing a long-term boot on the left foot after a surgery approximately 10 months ago and a  chronic wound on the bottom of the foot.  No purulence or fluctuance and the patient says it has improved significantly over the last few months.  No drainage..  No evidence of active infection, no erythema, no cellulitis.  The right foot is well perfused with normal capillary  refill, no erythema, palpable distal pulse, no edema, no cellulitis, no pain.  Normal strength in dorsiflexion and plantar flexion. Neurologic:  Normal speech and language. No gross focal neurologic deficits are appreciated.  Skin:  Skin is warm, dry and intact. No rash noted.  Chronic wound in bottom of left foot as described above and pictured below. Psychiatric: Mood and affect are normal. Speech and behavior are normal.      ____________________________________________   LABS (all labs ordered are listed, but only abnormal results are displayed)  Labs Reviewed  CBC WITH DIFFERENTIAL/PLATELET - Abnormal; Notable for the following components:      Result Value   WBC 11.2 (*)    Hemoglobin 11.2 (*)    HCT 35.6 (*)    Neutro Abs 8.9 (*)    All other components within normal limits  URINALYSIS, COMPLETE (UACMP) WITH MICROSCOPIC - Abnormal; Notable for the following components:   Color, Urine YELLOW (*)    APPearance CLEAR (*)    Leukocytes,Ua SMALL (*)    All other components within normal limits  NOVEL CORONAVIRUS, NAA (HOSPITAL ORDER, SEND-OUT TO REF LAB)  LACTIC ACID, PLASMA  COMPREHENSIVE METABOLIC PANEL  GLUCOSE, CAPILLARY   ____________________________________________  EKG  No indication for EKG ____________________________________________  RADIOLOGY   ED MD interpretation: No abnormalities identified on chest x-ray  Official radiology report(s): Dg Chest 2 View  Result Date: 11/27/2018 CLINICAL DATA:  63 year old female with fever. EXAM: CHEST - 2 VIEW COMPARISON:  Chest radiograph dated 10/31/2017 FINDINGS: The lungs are clear. There is no pleural effusion or pneumothorax. The cardiac  silhouette is within normal limits. No acute osseous pathology. Cervical ACDF. IMPRESSION: No active cardiopulmonary disease. Electronically Signed   By: Anner Crete M.D.   On: 11/27/2018 21:35    ____________________________________________   PROCEDURES   Procedure(s) performed (including Critical Care):  Procedures   ____________________________________________   INITIAL IMPRESSION / MDM / Portal / ED COURSE  As part of my medical decision making, I reviewed the following data within the [place] notes reviewed and incorporated, Labs reviewed , Old chart reviewed, Radiograph reviewed , Notes from prior ED visits and [place]rolled Substance [place] was evaluated in Emergency Department on 11/28/2018 for the symptoms described in the history of present illness. She was evaluated in the context of the global COVID-19 pandemic, which necessitated consideration that the patient might be at risk for infection with the SARS-CoV-2 virus that causes COVID-19. Institutional protocols and algorithms that pertain to the evaluation of patients at risk for COVID-19 are in a state of rapid change based on information released by regulatory bodies including the CDC and federal and state organizations. These policies and algorithms were followed during the patient's care in the ED.  Some ED evaluations and interventions may be delayed as a result of limited staffing during the pandemic.*  Differential diagnosis includes, but is not limited to, worsening chronic diabetic neuropathy, vascular obstruction including either arterial thrombus or DVT, diabetic foot ulcer, other trauma to the foot, urinary tract infection, COVID-19.  The patient should not have COVID-19 based on her attempts at isolation and having no other respiratory symptoms but at this point I do not have a better explanation for her fever.  Her lab work is all within normal  limits, no leukocytosis, no hypoxemia, no tachycardia except for her very initial triage vital signs.  She is in no distress and has  no symptoms other than the worsening neuropathy in her right foot.  Her right foot is generally well-appearing with good capillary refill, good perfusion, no acute or emergent abnormalities at this time.  There is no indication for further imaging.  The left foot wound is healing and does not appear acutely and should not be the cause of her fever.  I am sending the nasal swab for a send out COVID-19 test and I encouraged her to continue isolating herself from her family and others as much as possible until the results are available in my chart.  She will follow-up with neurology for the neuropathy and continue using her regular medications.  I also encouraged her to follow-up with her podiatrist.  I gave my usual customary return precautions and she understands and agrees with the plan.      ____________________________________________  FINAL CLINICAL IMPRESSION(S) / ED DIAGNOSES  Final diagnoses:  Neuropathy  Fever, unspecified fever cause     MEDICATIONS GIVEN DURING THIS VISIT:  Medications  sodium chloride flush (NS) 0.9 % injection 3 mL (has no administration in time range)  acetaminophen (TYLENOL) tablet 650 mg (650 mg Oral Given 11/27/18 1902)     ED Discharge Orders    None       Note:  This document was prepared using Dragon voice recognition software and may include unintentional dictation errors.   Hinda Kehr, MD 11/28/18 [phone]

## 2018-11-27 NOTE — ED Notes (Signed)
Sent urine sample to lab 

## 2018-11-28 ENCOUNTER — Telehealth: Payer: Self-pay | Admitting: *Deleted

## 2018-11-28 ENCOUNTER — Other Ambulatory Visit: Payer: Self-pay | Admitting: Podiatry

## 2018-11-28 MED ORDER — AMOXICILLIN-POT CLAVULANATE 875-125 MG PO TABS: 1.0000 | ORAL_TABLET | Freq: Two times a day (BID) | ORAL | 0 refills | Status: DC

## 2018-11-28 NOTE — Progress Notes (Signed)
Patient called and said she went to the ER last night for a fever and numbness in the right foot. WBC elevated. Although the foot looks good I am going to start antibiotics in case of the foot infection. Awaiting COVID test results. I am also going to recheck blood work tomorrow and order placed. Offered appointment to come in once covid test comes back but if any worsening to go back to the ER.

## 2018-11-28 NOTE — Addendum Note (Signed)
Addended by: [place] Mon R on: 11/28/2018 09:14 AM   Modules accepted: Orders

## 2018-11-28 NOTE — Telephone Encounter (Signed)
Patient also stated that the ER done a chest x-ray and blood work and per Dr Jacqualyn Posey wanted to get more blood work tomorrow-cbc,sed rate,crp,bmp and relayed the message to the patient. [name]

## 2018-11-28 NOTE — Telephone Encounter (Signed)
I called patient today to let her know that the dressing packages would be coming and patient stated that she had to go to the ER last night due to the right foot was going numb and had a fever as well and the ER did a covid test and would get the results in two business days and that the ER told her to make an appointment with her neurologist for the neuropathy and per Dr Jacqualyn Posey patient was sent antibiotics to take and if any concerns or questions to call the [place] office at [phone]. [name]

## 2018-11-29 DIAGNOSIS — W19XXXD Unspecified fall, subsequent encounter: Secondary | ICD-10-CM | POA: Diagnosis not present

## 2018-11-29 DIAGNOSIS — E1143 Type 2 diabetes mellitus with diabetic autonomic (poly)neuropathy: Secondary | ICD-10-CM | POA: Diagnosis not present

## 2018-11-29 DIAGNOSIS — M779 Enthesopathy, unspecified: Secondary | ICD-10-CM | POA: Diagnosis not present

## 2018-11-29 DIAGNOSIS — S99921D Unspecified injury of right foot, subsequent encounter: Secondary | ICD-10-CM | POA: Diagnosis not present

## 2018-11-29 DIAGNOSIS — E1142 Type 2 diabetes mellitus with diabetic polyneuropathy: Secondary | ICD-10-CM | POA: Diagnosis not present

## 2018-11-29 DIAGNOSIS — E11610 Type 2 diabetes mellitus with diabetic neuropathic arthropathy: Secondary | ICD-10-CM | POA: Diagnosis not present

## 2018-11-29 LAB — NOVEL CORONAVIRUS, NAA (HOSP ORDER, SEND-OUT TO REF LAB; TAT 18-24 HRS): SARS-CoV-2, NAA: NOT DETECTED

## 2018-12-02 ENCOUNTER — Telehealth: Payer: Self-pay | Admitting: *Deleted

## 2018-12-02 NOTE — Telephone Encounter (Signed)
Also while at the ER the patient was tested for the Co-vid virus and the test results were still pending as of Friday. Olivia Romero

## 2018-12-02 NOTE — Telephone Encounter (Signed)
Patient went to the ER on June 24 the and had some draining with the left foot and patient emailed some pictures and I showed Dr Jacqualyn Posey and per Dr Jacqualyn Posey just use betadine and to stop hydra blue and patient stated that there was no redness and when she went to the ER, patient did  have a fever of 101.0 and ER did a chest x-ray and some blood work was done also at the ER and sent her home without antibiotics and Dr Jacqualyn Posey called in some antibiotics and did want to get more blood work and I relayed that to the patient and patient called on Friday and stated that it was better but did not go and get the blood work done and was taken the antibiotic and I stated to call the office if any concerns or questions. [name]

## 2018-12-03 ENCOUNTER — Ambulatory Visit (INDEPENDENT_AMBULATORY_CARE_PROVIDER_SITE_OTHER): Payer: Medicare Other

## 2018-12-03 ENCOUNTER — Ambulatory Visit (INDEPENDENT_AMBULATORY_CARE_PROVIDER_SITE_OTHER): Payer: Medicare Other | Admitting: Podiatry

## 2018-12-03 ENCOUNTER — Encounter: Payer: Medicare Other | Admitting: Family Medicine

## 2018-12-03 ENCOUNTER — Other Ambulatory Visit: Payer: Self-pay

## 2018-12-03 ENCOUNTER — Encounter: Payer: Self-pay | Admitting: Podiatry

## 2018-12-03 VITALS — Temp 98.0°F

## 2018-12-03 DIAGNOSIS — L97521 Non-pressure chronic ulcer of other part of left foot limited to breakdown of skin: Secondary | ICD-10-CM

## 2018-12-03 DIAGNOSIS — L97522 Non-pressure chronic ulcer of other part of left foot with fat layer exposed: Secondary | ICD-10-CM

## 2018-12-03 DIAGNOSIS — M869 Osteomyelitis, unspecified: Secondary | ICD-10-CM | POA: Diagnosis not present

## 2018-12-03 DIAGNOSIS — E1149 Type 2 diabetes mellitus with other diabetic neurological complication: Secondary | ICD-10-CM | POA: Diagnosis not present

## 2018-12-03 DIAGNOSIS — M86272 Subacute osteomyelitis, left ankle and foot: Secondary | ICD-10-CM | POA: Diagnosis not present

## 2018-12-03 NOTE — Progress Notes (Addendum)
Subjective: 63 year old female presents the office today for concerns of numbness to her right foot as well as wound on her left foot.  The last saw her she states that she was driving and she lost pain into her foot.  She states that she knows the neuropathy is been getting worse however the numbness and not able to feel the foot has become worse over the last week.  She does have a follow-up evaluation with neurology in the next couple weeks.  She also states that when she went to her doctor for that she was found to have a fever of 101 and she was sent to the emergency room.  She had a COVID test which was negative.  Her white blood cell count was mildly elevated.  Because of this I started on Augmentin.  I ordered repeat blood work but she has not had this done yet as she states she "forgot". She states that she has noticed a hard "knot" formed on the side of her foot just behind the wound.  She has not had any pus but just bloody drainage.  Some localized redness and swelling but no red streaks.  She not had a fever since Sunday.  Denies any systemic complaints such as fevers, chills, nausea, vomiting. No acute changes since last appointment, and no other complaints at this time.   Objective: AAO x3, NAD DP/PT pulses palpable bilaterally, CRT less than 3 seconds Sensation to the bilateral lower extremities. On the right side there is no open lesions.  Cavovarus deformity present. On the left side cavovarus deformities present with chronic Charcot changes.  Ulceration continues on the lateral aspect of the long fifth metatarsal base.  There is localized edema and erythema to the area but there is no fluctuation crepitation.  The wound does probe approximately 2 cm today there is no probing to bone that seems to go plantar as well as proximal.  This does correspond to the area of erythema.  Unable to get any purulence coming from the area.  There is no ascending cellulitis.  There is no malodor.  Very  minimal increase in warmth.  And this is localized around the wound. No edema, erythema, increase in warmth to bilateral lower extremities.  No open lesions or pre-ulcerative lesions.  No pain with calf compression, swelling, warmth, erythema  Assessment: Localized cellulitis left foot; worsening neuropathy The Plan: -All treatment options discussed with the patient including all alternatives, risks, complications.  -X-rays were obtained reviewed.  Questional cortical changes present concerning for osteomyelitis.  Also there is soft tissue air present however this does correspond directly to the wound.  I am able to probe the wound and the wound is open.  There is no fluctuation crepitation clinically and there is no clinical signs of an abscess otherwise. -A wound culture was obtained of the left foot.  Continue Augmentin for now.  I will change based on the wound culture.  Also she is going to get the blood work done tomorrow.  I did dispense a cam boot as I do not want the [name] causing pressure. -We will see her back next week with Dr. [name] for evaluation.  If there is any worsening she must go the emergency room.  I discussed with her fifth ray amputation.  I think at this point we need to do this and we will plan on doing this next couple weeks, order an MRI. -Patient encouraged to call the office with any questions, concerns, change  in symptoms.   Trula Slade DPM

## 2018-12-04 ENCOUNTER — Other Ambulatory Visit: Payer: Self-pay

## 2018-12-04 ENCOUNTER — Telehealth: Payer: Self-pay | Admitting: Podiatry

## 2018-12-04 ENCOUNTER — Encounter (HOSPITAL_COMMUNITY): Payer: Self-pay

## 2018-12-04 ENCOUNTER — Other Ambulatory Visit: Payer: Self-pay | Admitting: Podiatry

## 2018-12-04 ENCOUNTER — Inpatient Hospital Stay (HOSPITAL_COMMUNITY)
Admission: EM | Admit: 2018-12-04 | Discharge: 2018-12-12 | DRG: 854 | Disposition: A | Discharge status: Skilled Nursing Facility | Payer: Medicare Other | Attending: Family Medicine | Admitting: Family Medicine

## 2018-12-04 ENCOUNTER — Emergency Department (HOSPITAL_COMMUNITY): Payer: Medicare Other

## 2018-12-04 ENCOUNTER — Telehealth: Payer: Self-pay | Admitting: *Deleted

## 2018-12-04 DIAGNOSIS — Z6836 Body mass index (BMI) 36.0-36.9, adult: Secondary | ICD-10-CM

## 2018-12-04 DIAGNOSIS — Z888 Allergy status to other drugs, medicaments and biological substances status: Secondary | ICD-10-CM

## 2018-12-04 DIAGNOSIS — Z91048 Other nonmedicinal substance allergy status: Secondary | ICD-10-CM | POA: Diagnosis not present

## 2018-12-04 DIAGNOSIS — J45909 Unspecified asthma, uncomplicated: Secondary | ICD-10-CM | POA: Diagnosis present

## 2018-12-04 DIAGNOSIS — I959 Hypotension, unspecified: Secondary | ICD-10-CM | POA: Diagnosis not present

## 2018-12-04 DIAGNOSIS — Z8261 Family history of arthritis: Secondary | ICD-10-CM

## 2018-12-04 DIAGNOSIS — Z95828 Presence of other vascular implants and grafts: Secondary | ICD-10-CM | POA: Diagnosis not present

## 2018-12-04 DIAGNOSIS — E1121 Type 2 diabetes mellitus with diabetic nephropathy: Secondary | ICD-10-CM | POA: Diagnosis not present

## 2018-12-04 DIAGNOSIS — L089 Local infection of the skin and subcutaneous tissue, unspecified: Secondary | ICD-10-CM

## 2018-12-04 DIAGNOSIS — Z9889 Other specified postprocedural states: Secondary | ICD-10-CM

## 2018-12-04 DIAGNOSIS — D649 Anemia, unspecified: Secondary | ICD-10-CM | POA: Diagnosis not present

## 2018-12-04 DIAGNOSIS — Z981 Arthrodesis status: Secondary | ICD-10-CM | POA: Diagnosis not present

## 2018-12-04 DIAGNOSIS — M869 Osteomyelitis, unspecified: Secondary | ICD-10-CM | POA: Diagnosis not present

## 2018-12-04 DIAGNOSIS — E1142 Type 2 diabetes mellitus with diabetic polyneuropathy: Secondary | ICD-10-CM | POA: Diagnosis not present

## 2018-12-04 DIAGNOSIS — E1169 Type 2 diabetes mellitus with other specified complication: Secondary | ICD-10-CM | POA: Diagnosis present

## 2018-12-04 DIAGNOSIS — I739 Peripheral vascular disease, unspecified: Secondary | ICD-10-CM

## 2018-12-04 DIAGNOSIS — M86172 Other acute osteomyelitis, left ankle and foot: Secondary | ICD-10-CM

## 2018-12-04 DIAGNOSIS — M19072 Primary osteoarthritis, left ankle and foot: Secondary | ICD-10-CM | POA: Diagnosis not present

## 2018-12-04 DIAGNOSIS — D62 Acute posthemorrhagic anemia: Secondary | ICD-10-CM | POA: Diagnosis not present

## 2018-12-04 DIAGNOSIS — Z8249 Family history of ischemic heart disease and other diseases of the circulatory system: Secondary | ICD-10-CM

## 2018-12-04 DIAGNOSIS — E11610 Type 2 diabetes mellitus with diabetic neuropathic arthropathy: Secondary | ICD-10-CM | POA: Diagnosis present

## 2018-12-04 DIAGNOSIS — Z91040 Latex allergy status: Secondary | ICD-10-CM

## 2018-12-04 DIAGNOSIS — L97428 Non-pressure chronic ulcer of left heel and midfoot with other specified severity: Secondary | ICD-10-CM

## 2018-12-04 DIAGNOSIS — E1151 Type 2 diabetes mellitus with diabetic peripheral angiopathy without gangrene: Secondary | ICD-10-CM | POA: Diagnosis present

## 2018-12-04 DIAGNOSIS — Z8701 Personal history of pneumonia (recurrent): Secondary | ICD-10-CM

## 2018-12-04 DIAGNOSIS — L03116 Cellulitis of left lower limb: Secondary | ICD-10-CM | POA: Diagnosis present

## 2018-12-04 DIAGNOSIS — E11628 Type 2 diabetes mellitus with other skin complications: Secondary | ICD-10-CM

## 2018-12-04 DIAGNOSIS — A4102 Sepsis due to Methicillin resistant Staphylococcus aureus: Principal | ICD-10-CM | POA: Diagnosis present

## 2018-12-04 DIAGNOSIS — M86672 Other chronic osteomyelitis, left ankle and foot: Secondary | ICD-10-CM | POA: Diagnosis not present

## 2018-12-04 DIAGNOSIS — B9562 Methicillin resistant Staphylococcus aureus infection as the cause of diseases classified elsewhere: Secondary | ICD-10-CM | POA: Diagnosis not present

## 2018-12-04 DIAGNOSIS — E1165 Type 2 diabetes mellitus with hyperglycemia: Secondary | ICD-10-CM | POA: Diagnosis present

## 2018-12-04 DIAGNOSIS — E11621 Type 2 diabetes mellitus with foot ulcer: Secondary | ICD-10-CM | POA: Diagnosis not present

## 2018-12-04 DIAGNOSIS — E785 Hyperlipidemia, unspecified: Secondary | ICD-10-CM | POA: Diagnosis present

## 2018-12-04 DIAGNOSIS — Z794 Long term (current) use of insulin: Secondary | ICD-10-CM

## 2018-12-04 DIAGNOSIS — K5730 Diverticulosis of large intestine without perforation or abscess without bleeding: Secondary | ICD-10-CM | POA: Diagnosis present

## 2018-12-04 DIAGNOSIS — I10 Essential (primary) hypertension: Secondary | ICD-10-CM | POA: Diagnosis present

## 2018-12-04 DIAGNOSIS — M6281 Muscle weakness (generalized): Secondary | ICD-10-CM | POA: Diagnosis not present

## 2018-12-04 DIAGNOSIS — R509 Fever, unspecified: Secondary | ICD-10-CM | POA: Diagnosis not present

## 2018-12-04 DIAGNOSIS — L97529 Non-pressure chronic ulcer of other part of left foot with unspecified severity: Secondary | ICD-10-CM | POA: Diagnosis not present

## 2018-12-04 DIAGNOSIS — L0390 Cellulitis, unspecified: Secondary | ICD-10-CM | POA: Diagnosis not present

## 2018-12-04 DIAGNOSIS — Z7982 Long term (current) use of aspirin: Secondary | ICD-10-CM

## 2018-12-04 DIAGNOSIS — W19XXXD Unspecified fall, subsequent encounter: Secondary | ICD-10-CM | POA: Diagnosis not present

## 2018-12-04 DIAGNOSIS — Z91012 Allergy to eggs: Secondary | ICD-10-CM | POA: Diagnosis not present

## 2018-12-04 DIAGNOSIS — E1143 Type 2 diabetes mellitus with diabetic autonomic (poly)neuropathy: Secondary | ICD-10-CM | POA: Diagnosis present

## 2018-12-04 DIAGNOSIS — R011 Cardiac murmur, unspecified: Secondary | ICD-10-CM | POA: Diagnosis not present

## 2018-12-04 DIAGNOSIS — K219 Gastro-esophageal reflux disease without esophagitis: Secondary | ICD-10-CM | POA: Diagnosis present

## 2018-12-04 DIAGNOSIS — G4733 Obstructive sleep apnea (adult) (pediatric): Secondary | ICD-10-CM | POA: Diagnosis present

## 2018-12-04 DIAGNOSIS — E1140 Type 2 diabetes mellitus with diabetic neuropathy, unspecified: Secondary | ICD-10-CM | POA: Diagnosis present

## 2018-12-04 DIAGNOSIS — Z887 Allergy status to serum and vaccine status: Secondary | ICD-10-CM | POA: Diagnosis not present

## 2018-12-04 DIAGNOSIS — A419 Sepsis, unspecified organism: Secondary | ICD-10-CM | POA: Diagnosis not present

## 2018-12-04 DIAGNOSIS — M14672 Charcot's joint, left ankle and foot: Secondary | ICD-10-CM | POA: Diagnosis not present

## 2018-12-04 DIAGNOSIS — S99921D Unspecified injury of right foot, subsequent encounter: Secondary | ICD-10-CM | POA: Diagnosis not present

## 2018-12-04 DIAGNOSIS — L97524 Non-pressure chronic ulcer of other part of left foot with necrosis of bone: Secondary | ICD-10-CM | POA: Diagnosis not present

## 2018-12-04 DIAGNOSIS — E538 Deficiency of other specified B group vitamins: Secondary | ICD-10-CM

## 2018-12-04 DIAGNOSIS — R2689 Other abnormalities of gait and mobility: Secondary | ICD-10-CM | POA: Diagnosis not present

## 2018-12-04 DIAGNOSIS — M159 Polyosteoarthritis, unspecified: Secondary | ICD-10-CM | POA: Diagnosis present

## 2018-12-04 DIAGNOSIS — L97521 Non-pressure chronic ulcer of other part of left foot limited to breakdown of skin: Secondary | ICD-10-CM | POA: Diagnosis not present

## 2018-12-04 DIAGNOSIS — F3289 Other specified depressive episodes: Secondary | ICD-10-CM | POA: Diagnosis not present

## 2018-12-04 DIAGNOSIS — E119 Type 2 diabetes mellitus without complications: Secondary | ICD-10-CM | POA: Diagnosis not present

## 2018-12-04 DIAGNOSIS — Z1159 Encounter for screening for other viral diseases: Secondary | ICD-10-CM

## 2018-12-04 DIAGNOSIS — M60872 Other myositis, left ankle and foot: Secondary | ICD-10-CM | POA: Diagnosis not present

## 2018-12-04 DIAGNOSIS — Z823 Family history of stroke: Secondary | ICD-10-CM

## 2018-12-04 DIAGNOSIS — Z90710 Acquired absence of both cervix and uterus: Secondary | ICD-10-CM

## 2018-12-04 DIAGNOSIS — Z8051 Family history of malignant neoplasm of kidney: Secondary | ICD-10-CM

## 2018-12-04 DIAGNOSIS — Z801 Family history of malignant neoplasm of trachea, bronchus and lung: Secondary | ICD-10-CM

## 2018-12-04 DIAGNOSIS — Z89432 Acquired absence of left foot: Secondary | ICD-10-CM | POA: Diagnosis not present

## 2018-12-04 DIAGNOSIS — R7881 Bacteremia: Secondary | ICD-10-CM

## 2018-12-04 DIAGNOSIS — Z8673 Personal history of transient ischemic attack (TIA), and cerebral infarction without residual deficits: Secondary | ICD-10-CM

## 2018-12-04 DIAGNOSIS — M868X7 Other osteomyelitis, ankle and foot: Secondary | ICD-10-CM | POA: Diagnosis not present

## 2018-12-04 DIAGNOSIS — G5603 Carpal tunnel syndrome, bilateral upper limbs: Secondary | ICD-10-CM

## 2018-12-04 DIAGNOSIS — Z89422 Acquired absence of other left toe(s): Secondary | ICD-10-CM | POA: Diagnosis not present

## 2018-12-04 DIAGNOSIS — M609 Myositis, unspecified: Secondary | ICD-10-CM | POA: Diagnosis present

## 2018-12-04 DIAGNOSIS — K5790 Diverticulosis of intestine, part unspecified, without perforation or abscess without bleeding: Secondary | ICD-10-CM | POA: Diagnosis not present

## 2018-12-04 DIAGNOSIS — Z79899 Other long term (current) drug therapy: Secondary | ICD-10-CM

## 2018-12-04 DIAGNOSIS — M48062 Spinal stenosis, lumbar region with neurogenic claudication: Secondary | ICD-10-CM

## 2018-12-04 DIAGNOSIS — M779 Enthesopathy, unspecified: Secondary | ICD-10-CM | POA: Diagnosis not present

## 2018-12-04 DIAGNOSIS — J4520 Mild intermittent asthma, uncomplicated: Secondary | ICD-10-CM | POA: Diagnosis not present

## 2018-12-04 DIAGNOSIS — Z4781 Encounter for orthopedic aftercare following surgical amputation: Secondary | ICD-10-CM | POA: Diagnosis not present

## 2018-12-04 HISTORY — DX: Local infection of the skin and subcutaneous tissue, unspecified: L08.9

## 2018-12-04 HISTORY — DX: Local infection of the skin and subcutaneous tissue, unspecified: E11.628

## 2018-12-04 LAB — URINALYSIS, ROUTINE W REFLEX MICROSCOPIC
Bacteria, UA: NONE SEEN
Bilirubin Urine: NEGATIVE
Glucose, UA: >=500 mg/dL — AB
Hgb urine dipstick: NEGATIVE
Ketones, ur: NEGATIVE mg/dL
Leukocytes,Ua: NEGATIVE
Nitrite: NEGATIVE
Protein, ur: NEGATIVE mg/dL
Specific Gravity, Urine: 1.022 (ref 1.005–1.030)
pH: 5 (ref 5.0–8.0)

## 2018-12-04 LAB — COMPREHENSIVE METABOLIC PANEL
ALT: 19 U/L (ref 0–44)
AST: 20 U/L (ref 15–41)
Albumin: 3.7 g/dL (ref 3.5–5.0)
Alkaline Phosphatase: 68 U/L (ref 38–126)
Anion gap: 13 (ref 5–15)
BUN: 23 mg/dL (ref 8–23)
CO2: 21 mmol/L — ABNORMAL LOW (ref 22–32)
Calcium: 9.1 mg/dL (ref 8.9–10.3)
Chloride: 98 mmol/L (ref 98–111)
Creatinine, Ser: 0.91 mg/dL (ref 0.44–1.00)
GFR calc Af Amer: >60 mL/min (ref >60)
GFR calc non Af Amer: >60 mL/min (ref >60)
Glucose, Bld: 266 mg/dL — ABNORMAL HIGH (ref 70–99)
Potassium: 4 mmol/L (ref 3.5–5.1)
Sodium: 132 mmol/L — ABNORMAL LOW (ref 135–145)
Total Bilirubin: 0.3 mg/dL (ref 0.3–1.2)
Total Protein: 7.8 g/dL (ref 6.5–8.1)

## 2018-12-04 LAB — CBC WITH DIFFERENTIAL/PLATELET
Abs Immature Granulocytes: 0.11 10*3/uL — ABNORMAL HIGH (ref 0.00–0.07)
Basophils Absolute: 0.1 10*3/uL (ref 0.0–0.1)
Basophils Relative: 0 %
Eosinophils Absolute: 0.2 10*3/uL (ref 0.0–0.5)
Eosinophils Relative: 1 %
HCT: 36.1 % (ref 36.0–46.0)
Hemoglobin: 11.4 g/dL — ABNORMAL LOW (ref 12.0–15.0)
Immature Granulocytes: 1 %
Lymphocytes Relative: 7 %
Lymphs Abs: 1.2 10*3/uL (ref 0.7–4.0)
MCH: 25.9 pg — ABNORMAL LOW (ref 26.0–34.0)
MCHC: 31.6 g/dL (ref 30.0–36.0)
MCV: 81.9 fL (ref 80.0–100.0)
Monocytes Absolute: 1 10*3/uL (ref 0.1–1.0)
Monocytes Relative: 6 %
Neutro Abs: 14.7 10*3/uL — ABNORMAL HIGH (ref 1.7–7.7)
Neutrophils Relative %: 85 %
Platelets: 468 10*3/uL — ABNORMAL HIGH (ref 150–400)
RBC: 4.41 MIL/uL (ref 3.87–5.11)
RDW: 14 % (ref 11.5–15.5)
WBC: 17.2 10*3/uL — ABNORMAL HIGH (ref 4.0–10.5)
nRBC: 0 % (ref 0.0–0.2)

## 2018-12-04 LAB — LACTIC ACID, PLASMA
Lactic Acid, Venous: 2.4 mmol/L (ref 0.5–1.9)
Lactic Acid, Venous: 2.1 mmol/L (ref 0.5–1.9)

## 2018-12-04 LAB — GLUCOSE, CAPILLARY: Glucose-Capillary: 270 mg/dL — ABNORMAL HIGH (ref 70–99)

## 2018-12-04 LAB — SARS CORONAVIRUS 2 BY RT PCR (HOSPITAL ORDER, PERFORMED IN ~~LOC~~ HOSPITAL LAB): SARS Coronavirus 2: NEGATIVE

## 2018-12-04 MED ORDER — VANCOMYCIN HCL 10 G IV SOLR
2000.0000 mg | Freq: Once | INTRAVENOUS | Status: AC
  Administered 2018-12-04: 2000 mg via INTRAVENOUS
  Filled 2018-12-04: qty 2000

## 2018-12-04 MED ORDER — SODIUM CHLORIDE 0.9 % IV BOLUS
1000.0000 mL | Freq: Once | INTRAVENOUS | Status: AC
  Administered 2018-12-04: 1000 mL via INTRAVENOUS

## 2018-12-04 MED ORDER — ACETAMINOPHEN 325 MG PO TABS
650.0000 mg | ORAL_TABLET | Freq: Four times a day (QID) | ORAL | Status: DC | PRN
  Administered 2018-12-04 – 2018-12-07 (×5): 650 mg via ORAL
  Filled 2018-12-04 (×5): qty 2

## 2018-12-04 MED ORDER — HYDROCHLOROTHIAZIDE 25 MG PO TABS
12.5000 mg | ORAL_TABLET | Freq: Every day | ORAL | Status: DC
  Administered 2018-12-05 – 2018-12-09 (×4): 12.5 mg via ORAL
  Filled 2018-12-04 (×4): qty 1

## 2018-12-04 MED ORDER — DULOXETINE HCL 60 MG PO CPEP
60.0000 mg | ORAL_CAPSULE | Freq: Every day | ORAL | Status: DC
  Administered 2018-12-04 – 2018-12-11 (×8): 60 mg via ORAL
  Filled 2018-12-04 (×8): qty 1

## 2018-12-04 MED ORDER — PANTOPRAZOLE SODIUM 40 MG PO TBEC
40.0000 mg | DELAYED_RELEASE_TABLET | Freq: Every day | ORAL | Status: DC
  Administered 2018-12-05 – 2018-12-09 (×4): 40 mg via ORAL
  Filled 2018-12-04 (×4): qty 1

## 2018-12-04 MED ORDER — INSULIN ASPART 100 UNIT/ML ~~LOC~~ SOLN
0.0000 [IU] | Freq: Every day | SUBCUTANEOUS | Status: DC
  Administered 2018-12-04: 3 [IU] via SUBCUTANEOUS
  Administered 2018-12-05: 2 [IU] via SUBCUTANEOUS
  Administered 2018-12-06: 4 [IU] via SUBCUTANEOUS
  Administered 2018-12-07 – 2018-12-09 (×3): 2 [IU] via SUBCUTANEOUS

## 2018-12-04 MED ORDER — HEPARIN SODIUM (PORCINE) 5000 UNIT/ML IJ SOLN
5000.0000 [IU] | Freq: Three times a day (TID) | INTRAMUSCULAR | Status: DC
  Administered 2018-12-04 – 2018-12-12 (×23): 5000 [IU] via SUBCUTANEOUS
  Filled 2018-12-04 (×23): qty 1

## 2018-12-04 MED ORDER — VANCOMYCIN HCL IN DEXTROSE 1-5 GM/200ML-% IV SOLN
1000.0000 mg | INTRAVENOUS | Status: DC
  Administered 2018-12-05 – 2018-12-08 (×4): 1000 mg via INTRAVENOUS
  Filled 2018-12-04 (×4): qty 200

## 2018-12-04 MED ORDER — PIPERACILLIN-TAZOBACTAM 3.375 G IVPB
3.3750 g | Freq: Three times a day (TID) | INTRAVENOUS | Status: DC
  Administered 2018-12-04 – 2018-12-05 (×3): 3.375 g via INTRAVENOUS
  Filled 2018-12-04 (×4): qty 50

## 2018-12-04 MED ORDER — GABAPENTIN 400 MG PO CAPS
800.0000 mg | ORAL_CAPSULE | Freq: Three times a day (TID) | ORAL | Status: DC
  Administered 2018-12-04 – 2018-12-12 (×24): 800 mg via ORAL
  Filled 2018-12-04 (×12): qty 2
  Filled 2018-12-04: qty 8
  Filled 2018-12-04 (×12): qty 2

## 2018-12-04 MED ORDER — PIPERACILLIN-TAZOBACTAM 3.375 G IVPB 30 MIN
3.3750 g | Freq: Once | INTRAVENOUS | Status: AC
  Administered 2018-12-04: 3.375 g via INTRAVENOUS

## 2018-12-04 MED ORDER — SODIUM CHLORIDE 0.9% FLUSH: 3.0000 mL | Freq: Once | INTRAVENOUS | Status: DC

## 2018-12-04 MED ORDER — ALBUTEROL SULFATE (2.5 MG/3ML) 0.083% IN NEBU: 3.0000 mL | INHALATION_SOLUTION | Freq: Four times a day (QID) | RESPIRATORY_TRACT | Status: DC | PRN

## 2018-12-04 MED ORDER — ACETAMINOPHEN 650 MG RE SUPP: 650.0000 mg | Freq: Four times a day (QID) | RECTAL | Status: DC | PRN

## 2018-12-04 MED ORDER — INSULIN ASPART 100 UNIT/ML ~~LOC~~ SOLN
0.0000 [IU] | Freq: Three times a day (TID) | SUBCUTANEOUS | Status: DC
  Administered 2018-12-05: 15 [IU] via SUBCUTANEOUS
  Administered 2018-12-05: 2 [IU] via SUBCUTANEOUS
  Administered 2018-12-05: 8 [IU] via SUBCUTANEOUS
  Administered 2018-12-06 (×2): 3 [IU] via SUBCUTANEOUS
  Administered 2018-12-06: 11 [IU] via SUBCUTANEOUS
  Administered 2018-12-07 – 2018-12-08 (×4): 5 [IU] via SUBCUTANEOUS
  Administered 2018-12-08: 3 [IU] via SUBCUTANEOUS
  Administered 2018-12-08: 2 [IU] via SUBCUTANEOUS
  Administered 2018-12-09: 5 [IU] via SUBCUTANEOUS
  Administered 2018-12-09 – 2018-12-11 (×6): 3 [IU] via SUBCUTANEOUS
  Administered 2018-12-11: 5 [IU] via SUBCUTANEOUS
  Administered 2018-12-11: 3 [IU] via SUBCUTANEOUS
  Administered 2018-12-12 (×2): 2 [IU] via SUBCUTANEOUS
  Administered 2018-12-12: 8 [IU] via SUBCUTANEOUS

## 2018-12-04 NOTE — ED Notes (Signed)
Last tylenol the pt took at 1400

## 2018-12-04 NOTE — ED Notes (Signed)
Report given to [facility] on 5n

## 2018-12-04 NOTE — Telephone Encounter (Signed)
Pt called and stated that she has a 100 degree fever and she will be going to [facility]

## 2018-12-04 NOTE — Telephone Encounter (Signed)
I informed Dr Jacqualyn Posey that patient was going to the hospital. [name]

## 2018-12-04 NOTE — ED Provider Notes (Signed)
[facility] EMERGENCY DEPARTMENT Provider Note   CSN: [id number] Arrival date & time: 12/04/18  1504    History   Chief Complaint Chief Complaint  Patient presents with   Fever    HPI Olivia Romero is a 63 y.o. female.     HPI    63 year old history of nasal with a history of diabetes, asthma, diabetic neuropathy, Charcot foot, diabetic ulcer of the foot seen by Dr. Jacqualyn Posey of podiatry, presents with concern for fever and increasing left foot pain and swelling.  Patient has been seen approximately 1 week ago at [facility] when she was found to incidentally have a temperature of 100.4, and had work-up revealing no clear source of infection and recommended outpatient follow-up.  Reports she has been afebrile since that time, however over the weekend she reported increasing pain and swelling to the left foot.  Reports that has tenderness and swelling directly surrounding the area of the ulcer.  And this pain has been getting worse over the last couple days.  Today, she began to have a fever, and temperature is 103 on arrival to the emergency department.  Otherwise denies infectious symptoms including cough, shortness of breath, sore throat, abdominal pain.  Reports that when she does have fevers she typically has urinary frequency and reports that she also has urinary frequency today.  Reports that Dr. Carman Ching had told her that if she does not develop a fever, he recommends coming to the hospital, and calling him, she will likely require amputation.   Past Medical History:  Diagnosis Date   Anemia    Arthritis    Asthma    Carpal tunnel syndrome on right    Colon polyps    adenomatous   Diabetes mellitus without complication ([place])    Diverticulosis of colon    Esophagitis    GERD (gastroesophageal reflux disease)    Hemorrhoid    internal   Hyperlipemia    Hypertension    Neuropathy    Pneumonia    PONV (postoperative nausea and vomiting)      Sleep apnea    has had in the past lost 50 pounds and do longer uses cpap   Stroke-like episode ([place]) 2009   TIA    Patient Active Problem List   Diagnosis Date Noted   Charcot's joint of foot due to diabetes ([place]) 09/18/2018   BMI 36.0-36.9,adult    Myalgia due to statin 11/19/2017   Morbid obesity ([place]) 11/19/2017   Uncontrolled type 2 diabetes mellitus with hyperglycemia ([place]) 11/01/2017   B12 deficiency 11/01/2017   Skin ulcer of right ankle, limited to breakdown of skin ([place]) 12/31/2016   Diabetic foot ulcer ([place]) 12/14/2016   Diabetic polyneuropathy associated with type 2 diabetes mellitus ([place]) 12/13/2015   Lumbar stenosis with neurogenic claudication 09/02/2015   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 ([place]) 07/13/2015   Chronic constipation 07/13/2015   OSA (obstructive sleep apnea) 06/25/2015   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/23/2015   Hypertriglyceridemia 04/23/2015   Statin intolerance 04/23/2015   Asthma, mild intermittent 02/01/2015   Type 2 diabetes mellitus with diabetic nephropathy ([place]) 12/31/2014   Gastroesophageal reflux disease with esophagitis 12/31/2014   Dysphagia 12/31/2014   Bilateral carpal tunnel syndrome 12/01/2014   Diverticulosis of colon (without mention of hemorrhage) 11/30/2012   Essential hypertension, benign 11/28/2012    Past Surgical History:  Procedure Laterality Date   ABDOMINAL HYSTERECTOMY     BACK SURGERY  09/02/2015  [name]   COLONOSCOPY N/A 11/30/2012   Procedure: COLONOSCOPY;  Surgeon: Irene Shipper, MD;  Location: [facility] ENDOSCOPY;  Service: Endoscopy;  Laterality: N/A;   COLONOSCOPY WITH PROPOFOL N/A 01/03/2016   Procedure: COLONOSCOPY WITH PROPOFOL;  Surgeon: Manya Silvas, MD;  Location: [facility] ENDOSCOPY;  Service: Endoscopy;  Laterality: N/A;   ESOPHAGOGASTRODUODENOSCOPY (EGD) WITH PROPOFOL N/A 01/14/2015   Procedure:  ESOPHAGOGASTRODUODENOSCOPY (EGD) WITH PROPOFOL;  Surgeon: Manya Silvas, MD;  Location: [facility] ENDOSCOPY;  Service: Endoscopy;  Laterality: N/A;   IRRIGATION AND DEBRIDEMENT FOOT Left 03/13/2018   Procedure: IRRIGATION AND DEBRIDEMENT FOOT WITH BONE BIOPSY WITH MISONIX DEBRIDER;  Surgeon: Evelina Bucy, DPM;  Location: [facility];  Service: Podiatry;  Laterality: Left;   LUMBAR WOUND DEBRIDEMENT N/A 10/01/2015   Procedure: LUMBAR WOUND DEBRIDEMENT;  Surgeon: Ashok Pall, MD;  Location: [facility] NEURO ORS;  Service: Neurosurgery;  Laterality: N/A;  LUMBAR WOUND DEBRIDEMENT   SAVORY DILATION N/A 01/14/2015   Procedure: SAVORY DILATION;  Surgeon: Manya Silvas, MD;  Location: [facility] ENDOSCOPY;  Service: Endoscopy;  Laterality: N/A;   TONSILLECTOMY       OB History   No obstetric history on file.      Home Medications    Prior to Admission medications   Medication Sig Start Date End Date Taking? Authorizing Provider  acetaminophen (TYLENOL) 500 MG tablet Take 2 tablets (1,000 mg total) by mouth every 6 (six) hours as needed for mild pain. Maximum of 3,000 mg per day 09/17/18  Yes Lada, Satira Anis, MD  Albuterol Sulfate (PROAIR RESPICLICK) 270 (90 Base) MCG/ACT AEPB Inhale 1-2 puffs into the lungs every 6 (six) hours as needed (Shortness of breath or wheezing). 06/18/18  Yes [name], FNP  amoxicillin-clavulanate (AUGMENTIN) 875-125 MG tablet Take 1 tablet by mouth 2 (two) times daily. 11/28/18  Yes Trula Slade, DPM  aspirin EC 325 MG tablet Take 1 tablet (325 mg total) by mouth daily. 03/11/18  Yes Sowles, Drue Stager, MD  diclofenac sodium (VOLTAREN) 1 % GEL Apply 2 g topically 4 (four) times daily. Rub into affected area of foot 2 to 4 times daily 10/11/18  Yes Trula Slade, DPM  DULoxetine (CYMBALTA) 60 MG capsule Take 60 mg by mouth at bedtime. 10/31/16  Yes [provider]  fenofibrate (TRICOR) 145 MG tablet Take 145 mg by mouth daily. 12/05/16  Yes [provider]    gabapentin (NEURONTIN) 400 MG capsule Take 800 mg by mouth 3 (three) times daily.    Yes [provider]  glimepiride (AMARYL) 2 MG tablet Take 2 mg by mouth 2 (two) times daily.  10/29/15  Yes [provider]  hydrochlorothiazide (HYDRODIURIL) 12.5 MG tablet Take 12.5 mg by mouth daily.  02/06/18 02/06/19 Yes [provider]  Insulin Degludec (TRESIBA FLEXTOUCH) 200 UNIT/ML SOPN Inject 20 Units into the skin at bedtime.    Yes [provider]  metFORMIN (GLUCOPHAGE-XR) 500 MG 24 hr tablet Take 1,500 mg by mouth daily. 09/17/18 09/17/19 Yes Solum, Betsey Holiday, MD  Blood Glucose Monitoring Suppl (CONTOUR NEXT MONITOR) w/Device KIT as directed.  06/29/17   [provider]  pantoprazole (PROTONIX) 40 MG tablet Take 1 tablet (40 mg total) by mouth daily. Patient not taking: Reported on 12/04/2018 06/03/18   [name], FNP    Family History Family History  Problem Relation Age of Onset   Lung cancer Father    Hypertension Father  Arthritis Father    Other Mother        hardening of the arteries/renal cell carcenoma   Hypertension Mother    Stroke Mother    Kidney cancer Mother    Arthritis Brother    Rheum arthritis Maternal Uncle    Bladder Cancer Neg Hx     Social History Social History   Tobacco Use   Smoking status: Never Smoker   Smokeless tobacco: Never Used  Substance Use Topics   Alcohol use: Never    Frequency: Never   Drug use: No     Allergies   Eggs or egg-derived products, Atorvastatin, Flu virus vaccine, Latex, Pravastatin, and Tape   Review of Systems Review of Systems  Constitutional: Positive for fatigue and fever.  HENT: Negative for congestion and sore throat.   Eyes: Negative for visual disturbance.  Respiratory: Negative for cough and shortness of breath.   Cardiovascular: Negative for chest pain.  Gastrointestinal: Negative for abdominal pain, nausea and vomiting.  Genitourinary: Positive for  frequency. Negative for difficulty urinating.  Musculoskeletal: Positive for arthralgias and joint swelling. Negative for back pain and neck pain.  Skin: Negative for rash.  Neurological: Negative for syncope and headaches.     Physical Exam Updated Vital Signs BP (!) 124/57 (BP Location: Right Arm)    Pulse 86    Temp 99.8 F (37.7 C) (Oral)    Resp 17    SpO2 100%   Physical Exam Vitals signs and nursing note reviewed.  Constitutional:      General: She is not in acute distress.    Appearance: She is well-developed. She is not diaphoretic.  HENT:     Head: Normocephalic and atraumatic.  Eyes:     Conjunctiva/sclera: Conjunctivae normal.  Neck:     Musculoskeletal: Normal range of motion.  Cardiovascular:     Rate and Rhythm: Normal rate and regular rhythm.  Pulmonary:     Effort: Pulmonary effort is normal. No respiratory distress.     Breath sounds: Normal breath sounds. No wheezing.  Abdominal:     General: There is no distension.     Palpations: Abdomen is soft.     Tenderness: There is no abdominal tenderness. There is no guarding.  Musculoskeletal:        General: No tenderness.  Skin:    General: Skin is warm and dry.     Findings: Erythema present. No rash.     Comments: Left charcot foot with ulcer lateral with surrounding erythema, swelling, tenderness  Neurological:     Mental Status: She is alert and oriented to person, place, and time.      ED Treatments / Results  Labs (all labs ordered are listed, but only abnormal results are displayed) Labs Reviewed  LACTIC ACID, PLASMA - Abnormal; Notable for the following components:      Result Value   Lactic Acid, Venous 2.4 (*)    All other components within normal limits  LACTIC ACID, PLASMA - Abnormal; Notable for the following components:   Lactic Acid, Venous 2.1 (*)    All other components within normal limits  COMPREHENSIVE METABOLIC PANEL - Abnormal; Notable for the following components:   Sodium  132 (*)    CO2 21 (*)    Glucose, Bld 266 (*)    All other components within normal limits  CBC WITH DIFFERENTIAL/PLATELET - Abnormal; Notable for the following components:   WBC 17.2 (*)    Hemoglobin 11.4 (*)  MCH 25.9 (*)    Platelets 468 (*)    Neutro Abs 14.7 (*)    Abs Immature Granulocytes 0.11 (*)    All other components within normal limits  URINALYSIS, ROUTINE W REFLEX MICROSCOPIC - Abnormal; Notable for the following components:   APPearance HAZY (*)    Glucose, UA >=500 (*)    All other components within normal limits  BASIC METABOLIC PANEL - Abnormal; Notable for the following components:   Glucose, Bld 150 (*)    Calcium 8.8 (*)    All other components within normal limits  CBC - Abnormal; Notable for the following components:   WBC 16.0 (*)    RBC 3.70 (*)    Hemoglobin 9.6 (*)    HCT 30.1 (*)    MCH 25.9 (*)    Platelets 410 (*)    All other components within normal limits  GLUCOSE, CAPILLARY - Abnormal; Notable for the following components:   Glucose-Capillary 270 (*)    All other components within normal limits  GLUCOSE, CAPILLARY - Abnormal; Notable for the following components:   Glucose-Capillary 139 (*)    All other components within normal limits  CULTURE, BLOOD (ROUTINE X 2)  CULTURE, BLOOD (ROUTINE X 2)  SARS CORONAVIRUS 2 (HOSPITAL ORDER, [facility])  MRSA PCR SCREENING  URINE CULTURE  LACTIC ACID, PLASMA    EKG None  Radiology Dg Chest 2 View  Result Date: 12/04/2018 CLINICAL DATA:  Fevers EXAM: CHEST - 2 VIEW COMPARISON:  11/27/2018 FINDINGS: The heart size and mediastinal contours are within normal limits. Both lungs are clear. The visualized skeletal structures are unremarkable. IMPRESSION: No active cardiopulmonary disease. Electronically Signed   By: Inez Catalina M.D.   On: 12/04/2018 16:32   Mr Foot Left W Wo Contrast  Result Date: 12/05/2018 CLINICAL DATA:  Sepsis. Left foot ulcer. Evaluate for  osteomyelitis. EXAM: MRI OF THE LEFT FOREFOOT WITHOUT AND WITH CONTRAST TECHNIQUE: Multiplanar, multisequence MR imaging of the left foot was performed both before and after administration of intravenous contrast. CONTRAST:  7.5 cc Gadavist COMPARISON:  Radiographs 03/13/2018 and prior MRI 03/12/2018 FINDINGS: Marked soft tissue swelling along the lateral aspect of the foot mainly along the lateral aspect of the fifth metatarsal and cuboid area. There also appears to be some gas in the soft tissues and an open wound is likely. A 2 cm subcutaneous abscess is suspected on the T2 weighted images. After contrast administration this tissue demonstrates little if any enhancement and is worrisome for tissue necrosis. Myositis involving the short flexor muscles but no findings for pyomyositis. There is abnormal T1 and T2 signal intensity and enhancement in the bases of the fourth and fifth metatarsals and also along the lateral distal aspect of the cuboid. The joint space between these bones is slightly widened and contains fluid. Findings highly concerning for septic arthritis and osteomyelitis. There is also significant signal abnormality and enhancement in the talus and to a lesser extent the navicular bone. This seems distant from the site of the open wound and could be secondary to stress reaction and degenerative changes. Looking back at the prior MRI there was some similar areas of signal abnormality. Patient does have a significant hindfoot varus deformity and this may be stress related. Could not exclude the possibility of osteomyelitis. IMPRESSION: 1. 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 2. Cellulitis and myositis. Massive soft tissue thickening along the lateral aspect of the foot showing lack of enhancement and worrisome for necrotic tissue. There is  also a suspected 2 cm abscess in the soft tissues. 3. Nonspecific edema like signal abnormality and some enhancement in the talus and navicular bones. This may be stress related but could not totally exclude the possibility of osteomyelitis. Electronically Signed   By: Marijo Sanes M.D.   On: 12/05/2018 08:10   Dg Foot Complete Left  Result Date: 12/04/2018 Please see detailed radiograph report in office note.   Procedures .Critical Care Performed by: Gareth Morgan, MD Authorized by: Gareth Morgan, MD   Critical care provider statement:    Critical care time (minutes):  30   Critical care was time spent personally by me on the following activities:  Discussions with consultants, examination of patient, ordering and performing treatments and interventions, ordering and review of laboratory studies, pulse oximetry, obtaining history from patient or surrogate and review of old charts   (including critical care time)  Medications Ordered in ED Medications  sodium chloride flush (NS) 0.9 % injection 3 mL (has no administration in time range)  piperacillin-tazobactam (ZOSYN) IVPB 3.375 g (3.375 g Intravenous New Bag/Given 12/05/18 0706)  vancomycin (VANCOCIN) IVPB 1000 mg/200 mL premix (has no administration in time range)  hydrochlorothiazide (HYDRODIURIL) tablet 12.5 mg (12.5 mg Oral Given 12/05/18 0919)  DULoxetine (CYMBALTA) DR capsule 60 mg (60 mg Oral Given 12/04/18 2250)  pantoprazole (PROTONIX) EC tablet 40 mg (40 mg Oral Given 12/05/18 0919)  gabapentin (NEURONTIN) capsule 800 mg (800 mg Oral Given 12/05/18 0919)  albuterol (PROVENTIL) (2.5 MG/3ML) 0.083% nebulizer solution 3 mL (has no administration in time range)  heparin injection 5,000 Units (5,000 Units Subcutaneous Given 12/05/18 0706)  acetaminophen (TYLENOL) tablet 650 mg (650 mg Oral Given 12/05/18 0918)    Or  acetaminophen (TYLENOL) suppository 650 mg ( Rectal See Alternative 12/05/18 0918)  insulin aspart (novoLOG) injection  0-15 Units (2 Units Subcutaneous Given 12/05/18 0922)  insulin aspart (novoLOG) injection 0-5 Units (3 Units Subcutaneous Given 12/04/18 2251)  sodium chloride 0.9 % bolus 1,000 mL (0 mLs Intravenous Stopped 12/04/18 1951)  vancomycin (VANCOCIN) 2,000 mg in sodium chloride 0.9 % 500 mL IVPB (0 mg Intravenous Stopped 12/04/18 2207)  piperacillin-tazobactam (ZOSYN) IVPB 3.375 g (0 g Intravenous Stopped 12/04/18 1903)  gadobutrol (GADAVIST) 1 MMOL/ML injection 7.5 mL (7.5 mLs Intravenous Contrast Given 12/05/18 0735)     Initial Impression / Assessment and Plan / ED Course  I have reviewed the triage vital signs and the nursing notes.  Pertinent labs & imaging results that were available during my care of the patient were reviewed by me and considered in my medical decision making (see chart for details).         63 year old female with a history of diabetes, asthma, diabetic neuropathy, Charcot foot, diabetic ulcer of the foot seen by Dr. Jacqualyn Posey of podiatry, presents with concern for fever and increasing left foot pain and swelling.  Temperature 103, WBC 17, lactic acid 2.1 concerning for sepsis. By history and exam, suspect source is diabetic food wound. COVID19 testing negative. No respiratory symptoms. No UTI.  Consulted Podiatry and spoke with Dr. [name] who works with Dr. Jacqualyn Posey and reports they will see patient in the AM.  Gave vanc, zosyn and 1L of NS.  Admitted for further care.   Final Clinical Impressions(s) / ED Diagnoses   Final diagnoses:  Sepsis without  acute organ dysfunction, due to unspecified organism [facility])  Diabetic ulcer of left midfoot associated with type 2 diabetes mellitus, with other ulcer severity [facility])    ED Discharge Orders    None       Gareth Morgan, MD 12/05/18 1013

## 2018-12-04 NOTE — ED Notes (Signed)
Vancomycin not here from pharmacy

## 2018-12-04 NOTE — ED Notes (Signed)
Admitting doctor at the bedside 

## 2018-12-04 NOTE — H&P (Signed)
[facility] Admission History and Physical Service Pager: [phone]  Patient name: Olivia Romero Medical record number: [id number] Date of birth: Oct 18, 1955 Age: 63 y.o. Gender: female  Primary Care Provider: Arnetha Courser, MD Consultants: podiatry  Code Status: full Preferred Emergency Contact: daughter-in-law Garrel Ridgel  Chief Complaint: diabetic foot ulcer  Assessment and Plan: Olivia Romero is a 63 y.o. female presenting with sepsis. PMH is significant for poorly controlled T2DM, charcot foot, significant peripheral neuropathy, HTN, GERD, asthma  Sepsis 2/2 diabetic L foot ulcer and r/u osteomyelitis- present 1 year, acutely worsened, ~4days of augmentin. Xray at podiatrist office yesterday showed cortical changes concerning for osteomyelitis. Today, patient has foot MRI pending. Patient started on IV vanc and zosyn in ED. Other sources of infection were investigated with cxray and urinalysis which were negative for infection. Blood/urine cultures pending. CBC significant for elevated WBC of 17.2, with neutrophil predominance. LA elevated to 2.4, improved to 2.1 s/p 1L IV hydration. Physical exam positive for charcot deformity of left foot and large ulcer on lateral side (difficult to stage with amount of exudate and edema surrounding lesion). Most likely non-healing wound leading to systemic infection and possible osteomyelitis.  -admit to med-surg, attending Dr. Dannial Monarch - f/u podiatry consult, appreciate recs - consider ortho consult - continue IV vanc and zosyn - f/u foot MRI results - continue gabapentin 864m TID - tylenol PRN for pain - am LA (to trend) - am CBC (to monitor WBC) - am BMP (to monitor renal function with Abx)  Poorly controlled T2DM with peripheral neuropathy- A1c 7.9 09/2018. Endorses porr circulation and sensation in feet as well as numbness/tingling in hands. - continue gabapentin  - mSSI - CBG with meals and  QHS  Asthma- patient is currently asymptomatic and well controlled.  - continue home albuterol  HTN- normotensive on admission. Adherent with home medication - continue home HCTZ 12.566mdaily  Psych- - continue home duloxetine  FEN/GI: diabetic diet, s/p 1L bolus Prophylaxis: heparin subq in preparation for possible procedure  Disposition: med-surg. Dc pending ulcer and sepsis resolution  History of Present Illness:  Olivia CASTIGLIAs a [age].o. female presenting with 1 week intermittent fevers from likely osteomyelitis. patient has diabetic foot ulcer on L lateral foot. Has been present for about 1 year. She follows regularly with podiatry. About 1 week ago she developed a fever and was prescribed augmentin. Thought to be an infection originating at the ulcer with increased erythema surrounding the wound. With treatment, patient improved for a couple days. Today, she presented to ED after developing a new fever and at the instructions of her podiatrist. She took about 4 days of augmentin including this morning. She felt warm at home. Denies N/V, abdominal pain, diarrhea, new joint pains, polyuria, polydipsia, new weight loss, night sweats. She endorses having almost no feeling in bilateral feet which is chronic so does not feel pain with the lesion. She also has numbness tingling in feet and hands for which she takes gabapentin and venlafaxine.   Review Of Systems: Per HPI with the following additions:  Review of Systems  Constitutional: Positive for fever. Negative for weight loss.  Respiratory: Negative for cough.   Cardiovascular: Positive for leg swelling. Negative for chest pain.  Gastrointestinal: Negative for abdominal pain, diarrhea, nausea and vomiting.  Genitourinary: Negative for dysuria.  Musculoskeletal: Positive for back pain.  Skin: Negative for rash.    Patient Active Problem List   Diagnosis Date Noted  .  Charcot's joint of foot due to diabetes ([place]) 09/18/2018   . BMI 36.0-36.9,adult   . Myalgia due to statin 11/19/2017  . Morbid obesity ([place]) 11/19/2017  . Uncontrolled type 2 diabetes mellitus with hyperglycemia ([place]) 11/01/2017  . B12 deficiency 11/01/2017  . Skin ulcer of right ankle, limited to breakdown of skin ([place]) 12/31/2016  . Diabetic foot ulcer ([place]) 12/14/2016  . Diabetic polyneuropathy associated with type 2 diabetes mellitus ([place]) 12/13/2015  . Lumbar stenosis with neurogenic claudication 09/02/2015  . 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 ([place]) 07/13/2015  . Chronic constipation 07/13/2015  . OSA (obstructive sleep apnea) 06/25/2015  . 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/23/2015  . Hypertriglyceridemia 04/23/2015  . Statin intolerance 04/23/2015  . Asthma, mild intermittent 02/01/2015  . Type 2 diabetes mellitus with diabetic nephropathy ([place]) 12/31/2014  . Gastroesophageal reflux disease with esophagitis 12/31/2014  . Dysphagia 12/31/2014  . Bilateral carpal tunnel syndrome 12/01/2014  . Diverticulosis of colon (without mention of hemorrhage) 11/30/2012  . Essential hypertension, benign 11/28/2012    Past Medical History: Past Medical History:  Diagnosis Date  . Anemia   . Arthritis   . Asthma   . Carpal tunnel syndrome on right   . Colon polyps    adenomatous  . Diabetes mellitus without complication ([place])   . Diverticulosis of colon   . Esophagitis   . GERD (gastroesophageal reflux disease)   . Hemorrhoid    internal  . Hyperlipemia   . Hypertension   . Neuropathy   . Pneumonia   . PONV (postoperative nausea and vomiting)   . Sleep apnea    has had in the past lost 50 pounds and do longer uses cpap  . Stroke-like episode ([place]) 2009   TIA    Past Surgical History: Past Surgical History:  Procedure Laterality Date  . ABDOMINAL HYSTERECTOMY    . BACK SURGERY  09/02/2015  . CERVICAL FUSION    . CESAREAN SECTION  1986  . COLONOSCOPY N/A 11/30/2012   Procedure:  COLONOSCOPY;  Surgeon: Irene Shipper, MD;  Location: [facility] ENDOSCOPY;  Service: Endoscopy;  Laterality: N/A;  . COLONOSCOPY WITH PROPOFOL N/A 01/03/2016   Procedure: COLONOSCOPY WITH PROPOFOL;  Surgeon: Manya Silvas, MD;  Location: [facility] ENDOSCOPY;  Service: Endoscopy;  Laterality: N/A;  . ESOPHAGOGASTRODUODENOSCOPY (EGD) WITH PROPOFOL N/A 01/14/2015   Procedure: ESOPHAGOGASTRODUODENOSCOPY (EGD) WITH PROPOFOL;  Surgeon: Manya Silvas, MD;  Location: [facility] ENDOSCOPY;  Service: Endoscopy;  Laterality: N/A;  . IRRIGATION AND DEBRIDEMENT FOOT Left 03/13/2018   Procedure: IRRIGATION AND DEBRIDEMENT FOOT WITH BONE BIOPSY WITH MISONIX DEBRIDER;  Surgeon: Evelina Bucy, DPM;  Location: [place];  Service: Podiatry;  Laterality: Left;  . LUMBAR WOUND DEBRIDEMENT N/A 10/01/2015   Procedure: LUMBAR WOUND DEBRIDEMENT;  Surgeon: Ashok Pall, MD;  Location: [place] NEURO ORS;  Service: Neurosurgery;  Laterality: N/A;  LUMBAR WOUND DEBRIDEMENT  . SAVORY DILATION N/A 01/14/2015   Procedure: SAVORY DILATION;  Surgeon: Manya Silvas, MD;  Location[facility] END[facility]COPY;  Service: Endoscopy;  Laterality: N/A;  . TONSILLECTOMY      Social History: Social History   Tobacco Use  . Smoking status: Never Smoker  . Smokeless tobacco: Never Used  Substance Use Topics  . Alcohol use: Never    Frequency: Never  . Drug use: No   Please also refer to relevant sections of EMR.  Family History: Family History  Problem Relation Age of Onset  . Lung cancer Father   . Hypertension Father   .  Arthritis Father   . Other Mother        hardening of the arteries/renal cell carcenoma  . Hypertension Mother   . Stroke Mother   . Kidney cancer Mother   . Arthritis Brother   . Rheum arthritis Maternal Uncle   . Bladder Cancer Neg Hx    Allergies and Medications: Allergies  Allergen Reactions  . Eggs Or Egg-Derived Products Swelling and Other (See Comments)    Angioedema  . Atorvastatin Other (See Comments)    Liver  toxicity  . Flu Virus Vaccine Swelling    Arm swelled (site of injection)  . Latex Itching  . Pravastatin Itching and Rash  . Tape Rash and Other (See Comments)    TAPE PULLS OFF THE SKIN!!! Please use an alternative!!   No current facility-administered medications on file prior to encounter.    Current Outpatient Medications on File Prior to Encounter  Medication Sig Dispense Refill  . acetaminophen (TYLENOL) 500 MG tablet Take 2 tablets (1,000 mg total) by mouth every 6 (six) hours as needed for mild pain. Maximum of 3,000 mg per day    . Albuterol Sulfate (PROAIR RESPICLICK) 710 (90 Base) MCG/ACT AEPB Inhale 1-2 puffs into the lungs every 6 (six) hours as needed (Shortness of breath or wheezing). 1 each 2  . amoxicillin-clavulanate (AUGMENTIN) 875-125 MG tablet Take 1 tablet by mouth 2 (two) times daily. 20 tablet 0  . aspirin EC 325 MG tablet Take 1 tablet (325 mg total) by mouth daily. 30 tablet 0  . Blood Glucose Monitoring Suppl (CONTOUR NEXT MONITOR) w/Device KIT as directed.     . diclofenac sodium (VOLTAREN) 1 % GEL Apply 2 g topically 4 (four) times daily. Rub into affected area of foot 2 to 4 times daily 100 g 2  . DULoxetine (CYMBALTA) 60 MG capsule Take 60 mg by mouth at bedtime.    . fenofibrate (TRICOR) 145 MG tablet Take 145 mg by mouth daily.  10  . gabapentin (NEURONTIN) 400 MG capsule Take 800 mg by mouth 3 (three) times daily.     [name] glimepiride (AMARYL) 2 MG tablet Take 2 mg by mouth 2 (two) times daily.     . hydrochlorothiazide (HYDRODIURIL) 12.5 MG tablet Take 12.5 mg by mouth daily.     . Insulin Degludec (TRESIBA FLEXTOUCH) 200 UNIT/ML SOPN Inject 20 Units into the skin at bedtime.     . metFORMIN (GLUCOPHAGE-XR) 500 MG 24 hr tablet Take 1,500 mg by mouth daily.    . Multiple Vitamin (MULTIVITAMIN WITH MINERALS) TABS Take 1 tablet by mouth daily. Reported on 07/21/2015    . pantoprazole (PROTONIX) 40 MG tablet Take 1 tablet (40 mg total) by mouth daily. 90 tablet 1     Objective: BP 139/78 (BP Location: Left Arm)   Pulse (!) 114   Temp (!) 103.3 F (39.6 C) (Oral)   Resp 14   SpO2 96%  Exam: General: NAD, cooperative Eyes: EOM intact, negative injection ENTM: MMM Neck: soft, negative lymphadenopathy Cardiovascular: mild tachy, regular rhythm, no murmur appreciated Respiratory: CTAB, no increased WOB Gastrointestinal: soft abdomen, no masses, +BS diffusely MSK: charcot deformity left foot, otherwise normal development Derm: positive for ~2cm diameter ulcer with serosanguinous and exudative discharge. Obvious soft tissue edema without obvious fluid collection. Skin warm to the touch and erythematous- poorly demarcated borders. Patient does not have pain to palpation and no sensation. Can still ROM (please see clinical image for further detail) Neuro: alert and oriented, no  focal deficits Psych: normal affect and mood     Labs and Imaging: CBC BMET  Recent Labs  Lab 12/04/18 1615  WBC 17.2*  HGB 11.4*  HCT 36.1  PLT 468*   Recent Labs  Lab 12/04/18 1615  NA 132*  K 4.0  CL 98  CO2 21*  BUN 23  CREATININE 0.91  GLUCOSE 266*  CALCIUM 9.1    Dg Chest 2 View  Result Date: 12/04/2018 CLINICAL DATA:  Fevers EXAM: CHEST - 2 VIEW COMPARISON:  11/27/2018 FINDINGS: The heart size and mediastinal contours are within normal limits. Both lungs are clear. The visualized skeletal structures are unremarkable. IMPRESSION: No active cardiopulmonary disease. Electronically Signed   By: Inez Catalina M.D.   On: 12/04/2018 16:32   Dg Foot Complete Left  Result Date: 12/04/2018 Please see detailed radiograph report in office note.   Richarda Osmond, DO 12/04/2018, 6:18 PM PGY-2, [place] Intern pager: [phone], text pages welcome

## 2018-12-04 NOTE — Telephone Encounter (Signed)
-----   Message from Trula Slade, DPM sent at 12/03/2018  5:49 PM EDT ----- Can you please order an MRI of the left foot to rule out osteomyelitis of the 5th metatarsal base? She can do this at [facility]. Thanks.

## 2018-12-04 NOTE — Telephone Encounter (Signed)
Sent an epic order for an MRI to be done at [facility] at [place] address. [name]

## 2018-12-04 NOTE — ED Notes (Signed)
Lt foot rebandaged dsd

## 2018-12-04 NOTE — Addendum Note (Signed)
Addended by: Cranford Mon R on: 12/04/2018 08:39 AM   Modules accepted: Orders

## 2018-12-04 NOTE — ED Triage Notes (Signed)
Pt has an light pink area on lateral/bottom of left foot.  Pt states she has had problems with a pressure ulcer x 1 year in this area.  Pt concerned she a temp today, 100 on thermometer, took Tylenol 500 mg @ 1pm and 500 mg @ 2:20pm.  fe

## 2018-12-04 NOTE — ED Notes (Signed)
Wheeled pt in wheelchair to restroom to collect urine sample.

## 2018-12-04 NOTE — Progress Notes (Signed)
Pharmacy Antibiotic Note  Olivia Romero is a 63 y.o. female admitted on 12/04/2018 with osteomyelitis.  Pharmacy has been consulted for zosyn and vancomycin dosing. Scr 0.91 upon admit. Wt 87.1 kg  Plan: Zosyn 3.375g IV q8h (4 hour infusion) Vancomycin 2g IV once then 1g IV q24h F/u w/ cultures, clinical progression and renal function    Temp (24hrs), Avg:103.3 F (39.6 C), Min:103.3 F (39.6 C), Max:103.3 F (39.6 C)  Recent Labs  Lab 12/04/18 1615  WBC 17.2*  CREATININE 0.91  LATICACIDVEN 2.4*    Estimated Creatinine Clearance: 63.6 mL/min (by C-G formula based on SCr of 0.91 mg/dL).    Allergies  Allergen Reactions  . Eggs Or Egg-Derived Products Swelling and Other (See Comments)    Angioedema  . Atorvastatin Other (See Comments)    Liver toxicity  . Flu Virus Vaccine Swelling    Arm swelled (site of injection)  . Latex Itching  . Pravastatin Itching and Rash  . Tape Rash and Other (See Comments)    TAPE PULLS OFF THE SKIN!!! Please use an alternative!!    Antimicrobials this admission: Zosyn 3.375g 12/04/18 >>  Vancomycin 2g x1 12/04/18 Vancomycin 1g 12/05/18 >>  Dose adjustments this admission: n/a  Microbiology results: Pending  Thank you for allowing pharmacy to be a part of this patient's care.  Natale Lay 12/04/2018 6:23 PM

## 2018-12-05 ENCOUNTER — Encounter (HOSPITAL_COMMUNITY): Payer: Self-pay | Admitting: General Practice

## 2018-12-05 ENCOUNTER — Inpatient Hospital Stay (HOSPITAL_COMMUNITY): Payer: Medicare Other

## 2018-12-05 ENCOUNTER — Telehealth: Payer: Self-pay | Admitting: *Deleted

## 2018-12-05 DIAGNOSIS — Z8619 Personal history of other infectious and parasitic diseases: Secondary | ICD-10-CM

## 2018-12-05 DIAGNOSIS — L97524 Non-pressure chronic ulcer of other part of left foot with necrosis of bone: Secondary | ICD-10-CM

## 2018-12-05 DIAGNOSIS — L97529 Non-pressure chronic ulcer of other part of left foot with unspecified severity: Secondary | ICD-10-CM

## 2018-12-05 DIAGNOSIS — Z91040 Latex allergy status: Secondary | ICD-10-CM

## 2018-12-05 DIAGNOSIS — I10 Essential (primary) hypertension: Secondary | ICD-10-CM

## 2018-12-05 DIAGNOSIS — R7881 Bacteremia: Secondary | ICD-10-CM

## 2018-12-05 DIAGNOSIS — M86672 Other chronic osteomyelitis, left ankle and foot: Secondary | ICD-10-CM

## 2018-12-05 DIAGNOSIS — E1169 Type 2 diabetes mellitus with other specified complication: Secondary | ICD-10-CM

## 2018-12-05 DIAGNOSIS — Z887 Allergy status to serum and vaccine status: Secondary | ICD-10-CM

## 2018-12-05 DIAGNOSIS — M86172 Other acute osteomyelitis, left ankle and foot: Secondary | ICD-10-CM | POA: Diagnosis present

## 2018-12-05 DIAGNOSIS — L03116 Cellulitis of left lower limb: Secondary | ICD-10-CM

## 2018-12-05 DIAGNOSIS — E11610 Type 2 diabetes mellitus with diabetic neuropathic arthropathy: Secondary | ICD-10-CM

## 2018-12-05 DIAGNOSIS — G4733 Obstructive sleep apnea (adult) (pediatric): Secondary | ICD-10-CM

## 2018-12-05 DIAGNOSIS — Z91048 Other nonmedicinal substance allergy status: Secondary | ICD-10-CM

## 2018-12-05 DIAGNOSIS — A419 Sepsis, unspecified organism: Secondary | ICD-10-CM

## 2018-12-05 DIAGNOSIS — E1121 Type 2 diabetes mellitus with diabetic nephropathy: Secondary | ICD-10-CM

## 2018-12-05 DIAGNOSIS — B9562 Methicillin resistant Staphylococcus aureus infection as the cause of diseases classified elsewhere: Secondary | ICD-10-CM | POA: Diagnosis present

## 2018-12-05 DIAGNOSIS — K219 Gastro-esophageal reflux disease without esophagitis: Secondary | ICD-10-CM

## 2018-12-05 DIAGNOSIS — Z91012 Allergy to eggs: Secondary | ICD-10-CM

## 2018-12-05 DIAGNOSIS — E1142 Type 2 diabetes mellitus with diabetic polyneuropathy: Secondary | ICD-10-CM

## 2018-12-05 DIAGNOSIS — R011 Cardiac murmur, unspecified: Secondary | ICD-10-CM

## 2018-12-05 DIAGNOSIS — Z8631 Personal history of diabetic foot ulcer: Secondary | ICD-10-CM

## 2018-12-05 DIAGNOSIS — M869 Osteomyelitis, unspecified: Secondary | ICD-10-CM

## 2018-12-05 DIAGNOSIS — L0390 Cellulitis, unspecified: Secondary | ICD-10-CM

## 2018-12-05 DIAGNOSIS — I739 Peripheral vascular disease, unspecified: Secondary | ICD-10-CM | POA: Insufficient documentation

## 2018-12-05 DIAGNOSIS — E785 Hyperlipidemia, unspecified: Secondary | ICD-10-CM

## 2018-12-05 DIAGNOSIS — E1151 Type 2 diabetes mellitus with diabetic peripheral angiopathy without gangrene: Secondary | ICD-10-CM

## 2018-12-05 DIAGNOSIS — Z888 Allergy status to other drugs, medicaments and biological substances status: Secondary | ICD-10-CM

## 2018-12-05 DIAGNOSIS — K5790 Diverticulosis of intestine, part unspecified, without perforation or abscess without bleeding: Secondary | ICD-10-CM

## 2018-12-05 DIAGNOSIS — L97428 Non-pressure chronic ulcer of left heel and midfoot with other specified severity: Secondary | ICD-10-CM

## 2018-12-05 DIAGNOSIS — E11621 Type 2 diabetes mellitus with foot ulcer: Secondary | ICD-10-CM

## 2018-12-05 HISTORY — DX: Bacteremia: R78.81

## 2018-12-05 HISTORY — DX: Methicillin resistant Staphylococcus aureus infection as the cause of diseases classified elsewhere: B95.62

## 2018-12-05 LAB — CBC WITH DIFFERENTIAL/PLATELET
Basophils Absolute: 0.1 10*3/uL (ref 0.0–0.2)
Basos: 1 %
EOS (ABSOLUTE): 0.3 10*3/uL (ref 0.0–0.4)
Eos: 3 %
Hematocrit: 34.2 % (ref 34.0–46.6)
Hemoglobin: 11.2 g/dL (ref 11.1–15.9)
Immature Grans (Abs): 0.1 10*3/uL (ref 0.0–0.1)
Immature Granulocytes: 1 %
Lymphocytes Absolute: 2.9 10*3/uL (ref 0.7–3.1)
Lymphs: 23 %
MCH: 25.9 pg — ABNORMAL LOW (ref 26.6–33.0)
MCHC: 32.7 g/dL (ref 31.5–35.7)
MCV: 79 fL (ref 79–97)
Monocytes Absolute: 0.9 10*3/uL (ref 0.1–0.9)
Monocytes: 7 %
Neutrophils Absolute: 8.2 10*3/uL — ABNORMAL HIGH (ref 1.4–7.0)
Neutrophils: 65 %
Platelets: 498 10*3/uL — ABNORMAL HIGH (ref 150–450)
RBC: 4.33 x10E6/uL (ref 3.77–5.28)
RDW: 13.9 % (ref 11.7–15.4)
WBC: 12.5 10*3/uL — ABNORMAL HIGH (ref 3.4–10.8)

## 2018-12-05 LAB — BLOOD CULTURE ID PANEL (REFLEXED)

## 2018-12-05 LAB — CBC
HCT: 30.1 % — ABNORMAL LOW (ref 36.0–46.0)
Hemoglobin: 9.6 g/dL — ABNORMAL LOW (ref 12.0–15.0)
MCH: 25.9 pg — ABNORMAL LOW (ref 26.0–34.0)
MCHC: 31.9 g/dL (ref 30.0–36.0)
MCV: 81.4 fL (ref 80.0–100.0)
Platelets: 410 10*3/uL — ABNORMAL HIGH (ref 150–400)
RBC: 3.7 MIL/uL — ABNORMAL LOW (ref 3.87–5.11)
RDW: 14.1 % (ref 11.5–15.5)
WBC: 16 10*3/uL — ABNORMAL HIGH (ref 4.0–10.5)
nRBC: 0 % (ref 0.0–0.2)

## 2018-12-05 LAB — C-REACTIVE PROTEIN
CRP: 20 mg/L — ABNORMAL HIGH (ref 0–10)
CRP: 22 mg/dL — ABNORMAL HIGH (ref <1.0)

## 2018-12-05 LAB — BASIC METABOLIC PANEL
Anion gap: 10 (ref 5–15)
BUN/Creatinine Ratio: 31 — ABNORMAL HIGH (ref 12–28)
BUN: 21 mg/dL (ref 8–27)
BUN: 14 mg/dL (ref 8–23)
CO2: 23 mmol/L (ref 20–29)
CO2: 24 mmol/L (ref 22–32)
Calcium: 9.6 mg/dL (ref 8.7–10.3)
Calcium: 8.8 mg/dL — ABNORMAL LOW (ref 8.9–10.3)
Chloride: 97 mmol/L (ref 96–106)
Chloride: 103 mmol/L (ref 98–111)
Creatinine, Ser: 0.67 mg/dL (ref 0.57–1.00)
Creatinine, Ser: 0.78 mg/dL (ref 0.44–1.00)
GFR calc Af Amer: 110 mL/min/{1.73_m2} (ref >59)
GFR calc Af Amer: >60 mL/min (ref >60)
GFR calc non Af Amer: 95 mL/min/{1.73_m2} (ref >59)
GFR calc non Af Amer: >60 mL/min (ref >60)
Glucose, Bld: 150 mg/dL — ABNORMAL HIGH (ref 70–99)
Glucose: 108 mg/dL — ABNORMAL HIGH (ref 65–99)
Potassium: 5.5 mmol/L — ABNORMAL HIGH (ref 3.5–5.2)
Potassium: 3.9 mmol/L (ref 3.5–5.1)
Sodium: 137 mmol/L (ref 134–144)
Sodium: 137 mmol/L (ref 135–145)

## 2018-12-05 LAB — URINE CULTURE: Culture: NO GROWTH

## 2018-12-05 LAB — SEDIMENTATION RATE
Sed Rate: 41 mm/hr — ABNORMAL HIGH (ref 0–40)
Sed Rate: 60 mm/hr — ABNORMAL HIGH (ref 0–22)

## 2018-12-05 LAB — MRSA PCR SCREENING: MRSA by PCR: NEGATIVE

## 2018-12-05 LAB — GLUCOSE, CAPILLARY
Glucose-Capillary: 139 mg/dL — ABNORMAL HIGH (ref 70–99)
Glucose-Capillary: 275 mg/dL — ABNORMAL HIGH (ref 70–99)
Glucose-Capillary: 422 mg/dL — ABNORMAL HIGH (ref 70–99)
Glucose-Capillary: 218 mg/dL — ABNORMAL HIGH (ref 70–99)

## 2018-12-05 LAB — LACTIC ACID, PLASMA: Lactic Acid, Venous: 0.8 mmol/L (ref 0.5–1.9)

## 2018-12-05 MED ORDER — GADOBUTROL 1 MMOL/ML IV SOLN
7.5000 mL | Freq: Once | INTRAVENOUS | Status: AC | PRN
  Administered 2018-12-05: 7.5 mL via INTRAVENOUS

## 2018-12-05 MED ORDER — POVIDONE-IODINE 10 % EX SWAB: 2.0000 "application " | Freq: Once | CUTANEOUS | Status: DC

## 2018-12-05 NOTE — Consult Note (Signed)
Patient name: Olivia Romero        Medical record number: [id number] Date of birth: [birth year]        Age: 63 y.o.    Gender: female  Primary Care Provider: Arnetha Courser, MD Consultants: podiatry  Code Status: full Preferred Emergency Contact: daughter-in-law Garrel Ridgel  Reason for Consult: Diabetic foot ulcer left with infection.  Osteomyelitis left foot. HPI: Olivia Romero is a 63 y.o. female presenting with sepsis. PMH is significant for poorly controlled T2DM, charcot foot, significant peripheral neuropathy, HTN, GERD, asthma. Podiatry consulted after admission.  Patient has been followed closely by Dr. Celesta Gentile, another physician in our practice regarding the left foot diabetic ulcer.   Past Medical History:  Diagnosis Date  . Anemia   . Arthritis   . Asthma   . Carpal tunnel syndrome on right   . Colon polyps    adenomatous  . Diabetes mellitus without complication ([place])   . Diabetic infection of left foot ([place]) 12/2018  . Diverticulosis of colon   . Esophagitis   . GERD (gastroesophageal reflux disease)   . Hemorrhoid    internal  . Hyperlipemia   . Hypertension   . Myalgia due to statin 11/19/2017  . Neuropathy   . Pneumonia   . PONV (postoperative nausea and vomiting)   . Skin ulcer of right ankle, limited to breakdown of skin ([place]) 12/31/2016  . Sleep apnea    has had in the past lost 50 pounds and do longer uses cpap  . Statin intolerance 04/23/2015  . Stroke-like episode ([place]) 2009   TIA  . 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 ([place]) 07/13/2015  . Uncontrolled type 2 diabetes mellitus with hyperglycemia ([place]) 11/01/2017     Physical Exam: DP PT pulses palpable LLE.  Ulcer left lateral foot.  No significant malodor noted.  Serosanguineous drainage noted.  Erythema with edema encompassing the entire foot up to the level of the ankle with skin warm to touch.  Charcot deformity with cavovarus malposition.  Epicritic and  protective threshold absent left foot.  MRI Impression 12/05/2018:  1. MR findings consistent with septic arthritis at the cuboid articulation with the fourth and fifth metatarsal bases with osteomyelitis in the fourth and fifth metatarsals and the distal lateral cuboid. 2. Cellulitis and myositis. Massive soft tissue thickening along the lateral aspect of the foot showing lack of enhancement and worrisome for necrotic tissue. There is also a suspected 2 cm abscess in the soft tissues. 3. Nonspecific edema like signal abnormality and some enhancement in the talus and navicular bones. This may be stress related but could not totally exclude the possibility of osteomyelitis.  Assessment/Plan of Care  1.  Osteomyelitis left foot: MRI reviewed.  Recommend partial lateral foot amputation left foot.  I discussed the surgery with the patient and she agrees and is amenable to surgical partial foot amputation.  At the same time we will also perform bone biopsies to determine osteomyelitis versus Charcot, specifically of the navicular and talus.  Surgery scheduled for tomorrow, 12/06/2018, at 9:30 AM.  N.p.o. after midnight.  Orders placed  2.  Cellulitis left foot: Continue antibiotic management with infectious disease.      Edrick Kins, DPM [facility]  Dr. Edrick Kins, DPM    [address]  [place], [place] [address]                Office [phone]  Fax [phone]

## 2018-12-05 NOTE — Progress Notes (Signed)
I discussed the case with Dr. [name]. He will be by today to see the patient. Plan for surgery Friday.   Based on the pictures there is quite a bit more drainage and swelling than what was present when I saw her earlier this week. We will follow.  Celesta Gentile, DPM O: [phone] C: [phone]

## 2018-12-05 NOTE — Consult Note (Signed)
[facility] for Infectious Disease    Date of Admission:  12/04/2018   Total days of antibiotics 2        Day 2 Vanc, Zosyn               Reason for Consult: MRSA bacteremia    Referring Physician: Dr.McDiarmid, Blane Ohara, MD Primary Care Physician: Arnetha Courser, MD  Active Problems:   Diabetic foot ulcer ([place])   PAD (peripheral artery disease) ([place])   . DULoxetine  60 mg Oral QHS  . gabapentin  800 mg Oral TID  . heparin  5,000 Units Subcutaneous Q8H  . hydrochlorothiazide  12.5 mg Oral Daily  . insulin aspart  0-15 Units Subcutaneous TID WC  . insulin aspart  0-5 Units Subcutaneous QHS  . pantoprazole  40 mg Oral Daily  . sodium chloride flush  3 mL Intravenous Once   Recommendations: 1. MRSA bacteremia     - Repeat blood culture (received 2 days of abx)     - ESR, CRP     - C/w Vancomycin, d/c zosyn (can re-broaden following culture data)     - Consider echocardiogram after surgery to r/o endocarditis     - Will need PICC line when blood culture clears  2. Left metatarsal osteomyelitis     - Planned for surgery per podiatry on 12/06/18     - F/u OR sample for gram stain, wound culture  Assessment: Olivia Romero is a 63 yo F w/ PMH of T2DM and Chronic DFU admit for recurrent diabetic foot infection. She has had multiple prolonged antibiotic therapy for her recurrent infections that failed most likely due to poor source control as she has refused amputations. She needs definitive therapy for her diabetic foot ulcer, which would be amputation. Her recurrent infections and multiple rounds of abx therapy raises concerns for antibiotic resistance especially with staph aureus bacteremia. Will need to monitor closely and broaden antibiotics only if appropriate.  HPI: Olivia Romero is a 63 y.o. female  W/ PMh of Diverticulosis, GERD, HLD, HTN, T2DM and OSA presenting to [facility] with complaints of fever. She states that her disease course started 'years' ago when she  developed a 'pressure' ulcer on bottom of her feet. She mentions that she has pretty severe diabetic neuropathy and she has not been able to feel the bottom of her feet for some time. Her ulcer has never fully healed and she had recurrent infection of the ulcer with multiple visits to the hospital as well as podiatry office for repeat I&D and antimicrobial therapy. She was offered more definitive therapy with amputation in the past but she has repeatedly refused as she felt she was not emotionally ready. She states about a week ago, she had a febrile episode with worsening of her foot and she was started on Augmentin per Dr.Wagoner, her podiatrist. She initially felt improvement but yesterday night she had another episode of fever measured at home as 100F and she came to the ED for evaluation where she was admitted for infected diabetic foot ulcer. Since admission, she states she has not had any further episodes of fevers, chills, nausea, vomiting. She states her diabetic foot pain has been better controlled and was told that she will have surgery this Friday. She states that after some 'hard talks' with Dr.Wagoner she is now ready for amputation.  On chart review, she was noted to have had many recurrent visits to the ED  and hospital for recurrent infection of the same diabetic ulcer with one hospitalization on 03/12/19 requiring 5 weeks of IV antibiotic therapy with vancomycin, ceftriaxone and oral metronidazole for empiric coverage for long-standing diabetic foot ulcer with a PICC line. Prior wound cultures have grown multi-resistant staph haemolyticus, pseudomonas and klebsiella.  Her blood culture ID panel showed positive for MRSA and ID was consulted.  Review of Systems: Review of Systems  Constitutional: Positive for chills. Negative for fever and malaise/fatigue.  Respiratory: Negative for shortness of breath.   Cardiovascular: Negative for chest pain.  Gastrointestinal: Negative for constipation,  diarrhea, nausea and vomiting.  Musculoskeletal: Negative for back pain and joint pain.  Neurological: Positive for sensory change and headaches. Negative for dizziness and weakness.   Past Medical History:  Diagnosis Date  . Anemia   . Arthritis   . Asthma   . Carpal tunnel syndrome on right   . Colon polyps    adenomatous  . Diabetes mellitus without complication ([place])   . Diabetic infection of left foot ([place]) 12/2018  . Diverticulosis of colon   . Esophagitis   . GERD (gastroesophageal reflux disease)   . Hemorrhoid    internal  . Hyperlipemia   . Hypertension   . Neuropathy   . Pneumonia   . PONV (postoperative nausea and vomiting)   . Sleep apnea    has had in the past lost 50 pounds and do longer uses cpap  . Stroke-like episode ([place]) 2009   TIA   Social History   Tobacco Use  . Smoking status: Never Smoker  . Smokeless tobacco: Never Used  Substance Use Topics  . Alcohol use: Never    Frequency: Never  . Drug use: No    Family History  Problem Relation Age of Onset  . Lung cancer Father   . Hypertension Father   . Arthritis Father   . Other Mother        hardening of the arteries/renal cell carcenoma  . Hypertension Mother   . Stroke Mother   . Kidney cancer Mother   . Arthritis Brother   . Rheum arthritis Maternal Uncle   . Bladder Cancer Neg Hx    Allergies  Allergen Reactions  . Eggs Or Egg-Derived Products Swelling and Other (See Comments)    Angioedema  . Atorvastatin Other (See Comments)    Liver toxicity  . Flu Virus Vaccine Swelling    Arm swelled (site of injection)  . Latex Itching  . Pravastatin Itching and Rash  . Tape Rash and Other (See Comments)    TAPE PULLS OFF THE SKIN!!! Please use an alternative!!   OBJECTIVE: Blood pressure (!) 124/57, pulse 86, temperature 99.8 F (37.7 C), temperature source Oral, resp. rate 17, SpO2 100 %.  Physical Exam Constitutional:      General: She is not in acute distress.    Appearance:  She is obese.  HENT:     Mouth/Throat:     Mouth: Mucous membranes are moist.     Pharynx: Oropharynx is clear.  Eyes:     Extraocular Movements: Extraocular movements intact.     Conjunctiva/sclera: Conjunctivae normal.  Cardiovascular:     Rate and Rhythm: Normal rate and regular rhythm.     Heart sounds: Normal heart sounds. No murmur.  Pulmonary:     Effort: Pulmonary effort is normal.     Breath sounds: Normal breath sounds. No wheezing or rales.  Abdominal:     General: Bowel sounds are  normal.     Palpations: Abdomen is soft.     Tenderness: There is no abdominal tenderness.  Musculoskeletal: Normal range of motion.        General: Swelling (Swollen left foot with lateral plantar ulcer with purulent drainage and warmth to touch) present.     Right lower leg: No edema.  Skin:    General: Skin is warm and dry.  Neurological:     Mental Status: She is alert.     Comments: Lack of sensation on right lower extremity   Lab Results Lab Results  Component Value Date   WBC 16.0 (H) 12/05/2018   HGB 9.6 (L) 12/05/2018   HCT 30.1 (L) 12/05/2018   MCV 81.4 12/05/2018   PLT 410 (H) 12/05/2018    Lab Results  Component Value Date   CREATININE 0.78 12/05/2018   BUN 14 12/05/2018   NA 137 12/05/2018   K 3.9 12/05/2018   CL 103 12/05/2018   CO2 24 12/05/2018    Lab Results  Component Value Date   ALT 19 12/04/2018   AST 20 12/04/2018   ALKPHOS 68 12/04/2018   BILITOT 0.3 12/04/2018     Microbiology: Recent Results (from the past 240 hour(s))  Novel Coronavirus,NAA,(SEND-OUT TO REF LAB - TAT 24-48 hrs); Hosp Order     Status: None   Collection Time: 11/27/18 11:34 PM   Specimen: Nasopharyngeal Swab; Respiratory  Result Value Ref Range Status   SARS-CoV-2, NAA NOT DETECTED NOT DETECTED Final    Comment: (NOTE) This test was developed and its performance characteristics determined by [employer]. This test has not been FDA cleared or approved. This test has  been authorized by FDA under an Emergency Use Authorization (EUA). This test is only authorized for the duration of time the declaration that circumstances exist justifying the authorization of the emergency use of in vitro diagnostic tests for detection of SARS-CoV-2 virus and/or diagnosis of COVID-19 infection under section 564(b)(1) of the Act, 21 U.S.C. 283MOQ-9(U)(7), unless the authorization is terminated or revoked sooner. When diagnostic testing is negative, the possibility of a false negative result should be considered in the context of a patient's recent exposures and the presence of clinical signs and symptoms consistent with COVID-19. An individual without symptoms of COVID-19 and who is not shedding SARS-CoV-2 virus would expect to have a negative (not detected) result in this assay. Performed  At: [facility] [address] [place], [place] [id number] Rush Farmer MD SF:[id number]    [place]  Final    Comment: Performed at [facility], [address] [place], [place] [address]  WOUND CULTURE     Status: None (Preliminary result)   Collection Time: 12/03/18  4:36 PM   Specimen: Foot, Left; Wound  Result Value Ref Range Status   MICRO NUMBER: [id number]  Preliminary   SPECIMEN QUALITY: Adequate  Preliminary   SOURCE: FOOT, LEFT  Preliminary   STATUS: PRELIMINARY  Preliminary   GRAM STAIN:   Preliminary    No white blood cells seen Few epithelial cells Rare Gram positive cocci in pairs Rare Gram positive bacilli  Blood culture (routine x 2)     Status: None (Preliminary result)   Collection Time: 12/04/18  3:55 PM   Specimen: BLOOD  Result Value Ref Range Status   Specimen Description BLOOD RIGHT ANTECUBITAL  Final   Special Requests   Final    BOTTLES DRAWN AEROBIC AND ANAEROBIC Blood Culture results may not be optimal due  to an inadequate volume of blood received in culture bottles   Culture   Final    NO GROWTH < 24  HOURS Performed at [facility] [address], [place], [place] [address]    Report Status PENDING  Incomplete  Urine culture     Status: None   Collection Time: 12/04/18  5:12 PM   Specimen: Urine, Random  Result Value Ref Range Status   Specimen Description URINE, RANDOM  Final   Special Requests NONE  Final   Culture   Final    NO GROWTH Performed at [facility], [place] [address] [place], [place] [address]    Report Status 12/05/2018 FINAL  Final  SARS Coronavirus 2 (CEPHEID- Performed in [facility] hospital lab), Hosp Order     Status: None   Collection Time: 12/04/18  5:28 PM   Specimen: Nasopharyngeal Swab  Result Value Ref Range Status   SARS Coronavirus 2 NEGATIVE NEGATIVE Final    Comment: (NOTE) If result is NEGATIVE SARS-CoV-2 target nucleic acids are NOT DETECTED. The SARS-CoV-2 RNA is generally detectable in upper and lower  respiratory specimens during the acute phase of infection. The lowest  concentration of SARS-CoV-2 viral copies this assay can detect is 250  copies / mL. A negative result does not preclude SARS-CoV-2 infection  and should not be used as the sole basis for treatment or other  patient management decisions.  A negative result may occur with  improper specimen collection / handling, submission of specimen other  than nasopharyngeal swab, presence of viral mutation(s) within the  areas targeted by this assay, and inadequate number of viral copies  (<250 copies / mL). A negative result must be combined with clinical  observations, patient history, and epidemiological information. If result is POSITIVE SARS-CoV-2 target nucleic acids are DETECTED. The SARS-CoV-2 RNA is generally detectable in upper and lower  respiratory specimens dur ing the acute phase of infection.  Positive  results are indicative of active infection with SARS-CoV-2.  Clinical  correlation with patient history and other diagnostic information is  necessary to  determine patient infection status.  Positive results do  not rule out bacterial infection or co-infection with other viruses. If result is PRESUMPTIVE POSTIVE SARS-CoV-2 nucleic acids MAY BE PRESENT.   A presumptive positive result was obtained on the submitted specimen  and confirmed on repeat testing.  While 2019 novel coronavirus  (SARS-CoV-2) nucleic acids may be present in the submitted sample  additional confirmatory testing may be necessary for epidemiological  and / or clinical management purposes  to differentiate between  SARS-CoV-2 and other Sarbecovirus currently known to infect humans.  If clinically indicated additional testing with an alternate test  methodology [phone]) is advised. The SARS-CoV-2 RNA is generally  detectable in upper and lower respiratory sp ecimens during the acute  phase of infection. The expected result is Negative. Fact Sheet for Patients:  StrictlyIdeas.no Fact Sheet for Healthcare Providers: BankingDealers.co.za This test is not yet approved or cleared by the [place] FDA and has been authorized for detection and/or diagnosis of SARS-CoV-2 by FDA under an Emergency Use Authorization (EUA).  This EUA will remain in effect (meaning this test can be used) for the duration of the COVID-19 declaration under Section 564(b)(1) of the Act, 21 U.S.C. section 360bbb-3(b)(1), unless the authorization is terminated or revoked sooner. Performed at [facility], [place] [address], [place], [place] [address]   Blood culture (routine x 2)  Status: None (Preliminary result)   Collection Time: 12/04/18  5:37 PM   Specimen: BLOOD  Result Value Ref Range Status   Specimen Description BLOOD LEFT ANTECUBITAL  Final   Special Requests   Final    BOTTLES DRAWN AEROBIC AND ANAEROBIC Blood Culture adequate volume   Culture  Setup Time   Final    GRAM POSITIVE COCCI IN CLUSTERS IN BOTH AEROBIC AND ANAEROBIC  BOTTLES CRITICAL RESULT CALLED TO, READ BACK BY AND VERIFIED WITH: C[place] [id number] AT 58 BY CM Performed at [facility], [place] [address], [place], [place] [address]    Culture GRAM POSITIVE COCCI  Final   Report Status PENDING  Incomplete  Blood Culture ID Panel (Reflexed)     Status: Abnormal   Collection Time: 12/04/18  5:37 PM  Result Value Ref Range Status   Enterococcus species NOT DETECTED NOT DETECTED Final   Listeria monocytogenes NOT DETECTED NOT DETECTED Final   Staphylococcus species DETECTED (A) NOT DETECTED Final    Comment: CRITICAL RESULT CALLED TO, READ BACK BY AND VERIFIED WITH: PHARMD E SINCLAIRE [id number] AT 1310 BY CM    Staphylococcus aureus (BCID) DETECTED (A) NOT DETECTED Final    Comment: Methicillin (oxacillin)-resistant Staphylococcus aureus (MRSA). MRSA is predictably resistant to beta-lactam antibiotics (except ceftaroline). Preferred therapy is vancomycin unless clinically contraindicated. Patient requires contact precautions if  hospitalized. CRITICAL RESULT CALLED TO, READ BACK BY AND VERIFIED WITH: PHARMD E [place] [id number] AT 1310 BY CM    Methicillin resistance DETECTED (A) NOT DETECTED Final    Comment: CRITICAL RESULT CALLED TO, READ BACK BY AND VERIFIED WITH: PHARMD E [place] [id number] AT 1310 BY CM    Streptococcus species NOT DETECTED NOT DETECTED Final   Streptococcus agalactiae NOT DETECTED NOT DETECTED Final   Streptococcus pneumoniae NOT DETECTED NOT DETECTED Final   Streptococcus pyogenes NOT DETECTED NOT DETECTED Final   Acinetobacter baumannii NOT DETECTED NOT DETECTED Final   Enterobacteriaceae species NOT DETECTED NOT DETECTED Final   Enterobacter cloacae complex NOT DETECTED NOT DETECTED Final   Escherichia coli NOT DETECTED NOT DETECTED Final   Klebsiella oxytoca NOT DETECTED NOT DETECTED Final   Klebsiella pneumoniae NOT DETECTED NOT DETECTED Final   Proteus species NOT DETECTED NOT DETECTED Final   Serratia  marcescens NOT DETECTED NOT DETECTED Final   Haemophilus influenzae NOT DETECTED NOT DETECTED Final   Neisseria meningitidis NOT DETECTED NOT DETECTED Final   Pseudomonas aeruginosa NOT DETECTED NOT DETECTED Final   Candida albicans NOT DETECTED NOT DETECTED Final   Candida glabrata NOT DETECTED NOT DETECTED Final   Candida krusei NOT DETECTED NOT DETECTED Final   Candida parapsilosis NOT DETECTED NOT DETECTED Final   Candida tropicalis NOT DETECTED NOT DETECTED Final    Comment: Performed at [facility], [address], [place], [place] [address]  MRSA PCR Screening     Status: None   Collection Time: 12/05/18  5:22 AM   Specimen: Nasal Mucosa; Nasopharyngeal  Result Value Ref Range Status   MRSA by PCR NEGATIVE NEGATIVE Final    Comment:        The GeneXpert MRSA Assay (FDA approved for NASAL specimens only), is one component of a comprehensive MRSA colonization surveillance program. It is not intended to diagnose MRSA infection nor to guide or monitor treatment for MRSA infections. Performed at [facility], [place] [address] [place], [place] [address]     K , [facility] for Infectious Disease  [facility] [phone] pager   [phone] cell 12/05/2018, 3:31 PM

## 2018-12-05 NOTE — Progress Notes (Signed)
Family Medicine Teaching Service Daily Progress Note Intern Pager: [phone]  Patient name: Olivia Romero Medical record number: [id number] Date of birth: November 28, 1955 Age: 63 y.o. Gender: female  Primary Care Provider: Arnetha Courser, MD Consultants: Podiatry Code Status: Full  Pt Overview and Major Events to Date:  07/02 MRI Lt foot  Assessment and Plan:   Sepsis 2/2 diabetic L foot ulcer and r/u osteomyelitis  No temp overnight. V/s stable Continue Antibiotics Podiatry   Poorly controlled T2DM with peripheral neuropathy  CBG 91, 270  Continue CBG monitoring and s/s insulin Continue Gabapentin   FEN/GI: Protonix PPx: Heparin  Disposition: observe overnight, pt scheduled for OR 07/03 per podiatry  Subjective:  Pt seen and assessed.  Pleasant and cooperative.  Pain controlled with Acetaminophen. No decrease in appetite.   Objective: Temp:  [97.5 F (36.4 C)-103.3 F (39.6 C)] 97.5 F (36.4 C) (07/01 2225) Pulse Rate:  [83-114] 83 (07/01 2225) Resp:  [14] 14 (07/01 2225) BP: (124-139)/(49-78) 124/49 (07/01 2225) SpO2:  [96 %-98 %] 98 % (07/01 2225) Physical Exam: General: A7O x3, NAD Cardiovascular: RRR, no murmurs Respiratory: CTAB, on room air Abdomen: soft, nontender, BS present in all quadrants,  Extremities: distal pulses present, Lt foot edematous, small ulcer noted to Lt lateral area, sinus tracking to 9 o'clock and 3 o'clock area with some purulent drainage   Laboratory: Recent Labs  Lab 12/04/18 1615  WBC 17.2*  HGB 11.4*  HCT 36.1  PLT 468*   Recent Labs  Lab 12/04/18 1615  NA 132*  K 4.0  CL 98  CO2 21*  BUN 23  CREATININE 0.91  CALCIUM 9.1  PROT 7.8  BILITOT 0.3  ALKPHOS 68  ALT 19  AST 20  GLUCOSE 266*      Imaging/Diagnostic Tests: MRI Lt foot- IMPRESSION: 1. MR findings consistent with septic arthritis at the cuboid articulation with the fourth and fifth metatarsal bases with osteomyelitis in the fourth and fifth  metatarsals and the distal lateral cuboid. 2. Cellulitis and myositis. Massive soft tissue thickening along the lateral aspect of the foot showing lack of enhancement and worrisome for necrotic tissue. There is also a suspected 2 cm abscess in the soft tissues. 3. Nonspecific edema like signal abnormality and some enhancement in the talus and navicular bones. This may be stress related but could not totally exclude the possibility of osteomyelitis.   Electronically Signed   By: Marijo Sanes M.D.   On: 12/05/2018 08:10  Carollee Leitz, MD 12/05/2018, 6:09 AM PGY1, [place] Intern pager: [phone], text pages welcome

## 2018-12-05 NOTE — Telephone Encounter (Signed)
Ordered the MRI of the right foot and faxed over to [facility] and put in JQ's orange folder. [name]

## 2018-12-05 NOTE — Telephone Encounter (Signed)
-----   Message from Trula Slade, DPM sent at 12/03/2018  5:49 PM EDT ----- Can you please order an MRI of the left foot to rule out osteomyelitis of the 5th metatarsal base? She can do this at [facility]. Thanks.

## 2018-12-05 NOTE — Progress Notes (Signed)
ABI has been completed.   Preliminary results in CV Proc.   Olivia Romero 12/05/2018 2:28 PM

## 2018-12-05 NOTE — Progress Notes (Signed)
PHARMACY - PHYSICIAN COMMUNICATION CRITICAL VALUE ALERT - BLOOD CULTURE IDENTIFICATION (BCID)  Olivia Romero is an 63 y.o. female who presented to [facility] on 12/04/2018 with a chief complaint of diabetic foot ulcer.   Assessment: 63 year old female with left foot ulcer and osteomyelitis of the fourth and fifth metatarsals and abscess in lateral aspect of the foot. Now with MRSA in 1/4 blood cultures.   Name of physician (or Provider) Contacted: FM Service - Dr. Volanda Napoleon   Current antibiotics: Vanc/Zosyn   Changes to prescribed antibiotics recommended:   Keep Vancomycin and Stop Zosyn    Results for orders placed or performed during the hospital encounter of 12/04/18  Blood Culture ID Panel (Reflexed) (Collected: 12/04/2018  5:37 PM)  Result Value Ref Range   Enterococcus species NOT DETECTED NOT DETECTED   Listeria monocytogenes NOT DETECTED NOT DETECTED   Staphylococcus species DETECTED (A) NOT DETECTED   Staphylococcus aureus (BCID) DETECTED (A) NOT DETECTED   Methicillin resistance DETECTED (A) NOT DETECTED   Streptococcus species NOT DETECTED NOT DETECTED   Streptococcus agalactiae NOT DETECTED NOT DETECTED   Streptococcus pneumoniae NOT DETECTED NOT DETECTED   Streptococcus pyogenes NOT DETECTED NOT DETECTED   Acinetobacter baumannii NOT DETECTED NOT DETECTED   Enterobacteriaceae species NOT DETECTED NOT DETECTED   Enterobacter cloacae complex NOT DETECTED NOT DETECTED   Escherichia coli NOT DETECTED NOT DETECTED   Klebsiella oxytoca NOT DETECTED NOT DETECTED   Klebsiella pneumoniae NOT DETECTED NOT DETECTED   Proteus species NOT DETECTED NOT DETECTED   Serratia marcescens NOT DETECTED NOT DETECTED   Haemophilus influenzae NOT DETECTED NOT DETECTED   Neisseria meningitidis NOT DETECTED NOT DETECTED   Pseudomonas aeruginosa NOT DETECTED NOT DETECTED   Candida albicans NOT DETECTED NOT DETECTED   Candida glabrata NOT DETECTED NOT DETECTED   Candida krusei NOT DETECTED NOT  DETECTED   Candida parapsilosis NOT DETECTED NOT DETECTED   Candida tropicalis NOT DETECTED NOT DETECTED    Jimmy Footman, PharmD, BCPS, BCIDP Infectious Diseases Clinical Pharmacist Phone: [phone] 12/05/2018  1:59 PM

## 2018-12-06 ENCOUNTER — Inpatient Hospital Stay (HOSPITAL_COMMUNITY): Payer: Medicare Other | Admitting: Certified Registered"

## 2018-12-06 ENCOUNTER — Encounter (HOSPITAL_COMMUNITY)
Admission: EM | Disposition: A | Discharge status: Skilled Nursing Facility | Payer: Self-pay | Source: Home / Self Care | Attending: Family Medicine

## 2018-12-06 ENCOUNTER — Inpatient Hospital Stay (HOSPITAL_COMMUNITY): Payer: Medicare Other

## 2018-12-06 ENCOUNTER — Encounter (HOSPITAL_COMMUNITY): Payer: Self-pay | Admitting: *Deleted

## 2018-12-06 DIAGNOSIS — L03116 Cellulitis of left lower limb: Secondary | ICD-10-CM

## 2018-12-06 DIAGNOSIS — Z9889 Other specified postprocedural states: Secondary | ICD-10-CM

## 2018-12-06 DIAGNOSIS — R011 Cardiac murmur, unspecified: Secondary | ICD-10-CM

## 2018-12-06 DIAGNOSIS — M86672 Other chronic osteomyelitis, left ankle and foot: Secondary | ICD-10-CM

## 2018-12-06 HISTORY — PX: IRRIGATION AND DEBRIDEMENT FOOT: SHX6602

## 2018-12-06 HISTORY — PX: BONE BIOPSY: SHX375

## 2018-12-06 HISTORY — PX: AMPUTATION: SHX166

## 2018-12-06 LAB — CBC
HCT: 29.9 % — ABNORMAL LOW (ref 36.0–46.0)
Hemoglobin: 9.5 g/dL — ABNORMAL LOW (ref 12.0–15.0)
MCH: 26.4 pg (ref 26.0–34.0)
MCHC: 31.8 g/dL (ref 30.0–36.0)
MCV: 83.1 fL (ref 80.0–100.0)
Platelets: 400 10*3/uL (ref 150–400)
RBC: 3.6 MIL/uL — ABNORMAL LOW (ref 3.87–5.11)
RDW: 14.2 % (ref 11.5–15.5)
WBC: 9.8 10*3/uL (ref 4.0–10.5)
nRBC: 0 % (ref 0.0–0.2)

## 2018-12-06 LAB — ECHOCARDIOGRAM COMPLETE
Height: 60 in
Weight: 3068.8 oz

## 2018-12-06 LAB — GLUCOSE, CAPILLARY
Glucose-Capillary: 200 mg/dL — ABNORMAL HIGH (ref 70–99)
Glucose-Capillary: 167 mg/dL — ABNORMAL HIGH (ref 70–99)
Glucose-Capillary: 180 mg/dL — ABNORMAL HIGH (ref 70–99)
Glucose-Capillary: 347 mg/dL — ABNORMAL HIGH (ref 70–99)
Glucose-Capillary: 328 mg/dL — ABNORMAL HIGH (ref 70–99)

## 2018-12-06 LAB — CREATININE, SERUM
Creatinine, Ser: 0.94 mg/dL (ref 0.44–1.00)
GFR calc Af Amer: >60 mL/min (ref >60)
GFR calc non Af Amer: >60 mL/min (ref >60)

## 2018-12-06 LAB — WOUND CULTURE
MICRO NUMBER:: 622451
SPECIMEN QUALITY:: ADEQUATE

## 2018-12-06 SURGERY — AMPUTATION, FOOT, PARTIAL
Anesthesia: General | Site: Foot | Laterality: Left

## 2018-12-06 MED ORDER — EPHEDRINE SULFATE-NACL 50-0.9 MG/10ML-% IV SOSY
PREFILLED_SYRINGE | INTRAVENOUS | Status: DC | PRN
  Administered 2018-12-06 (×3): 15 mg via INTRAVENOUS
  Administered 2018-12-06: 10 mg via INTRAVENOUS

## 2018-12-06 MED ORDER — ONDANSETRON HCL 4 MG/2ML IJ SOLN
INTRAMUSCULAR | Status: DC | PRN
  Administered 2018-12-06: 4 mg via INTRAVENOUS

## 2018-12-06 MED ORDER — ONDANSETRON HCL 4 MG/2ML IJ SOLN
INTRAMUSCULAR | Status: AC
  Filled 2018-12-06: qty 2

## 2018-12-06 MED ORDER — PROPOFOL 10 MG/ML IV BOLUS
INTRAVENOUS | Status: DC | PRN
  Administered 2018-12-06: 150 mg via INTRAVENOUS
  Administered 2018-12-06: 50 mg via INTRAVENOUS

## 2018-12-06 MED ORDER — MIDAZOLAM HCL 5 MG/5ML IJ SOLN
INTRAMUSCULAR | Status: DC | PRN
  Administered 2018-12-06: 2 mg via INTRAVENOUS

## 2018-12-06 MED ORDER — LIDOCAINE 2% (20 MG/ML) 5 ML SYRINGE
INTRAMUSCULAR | Status: AC
  Filled 2018-12-06: qty 5

## 2018-12-06 MED ORDER — PROPOFOL 10 MG/ML IV BOLUS
INTRAVENOUS | Status: AC
  Filled 2018-12-06: qty 20

## 2018-12-06 MED ORDER — LIDOCAINE HCL (PF) 2 % IJ SOLN
INTRAMUSCULAR | Status: DC | PRN
  Administered 2018-12-06: 10 mL via INTRADERMAL

## 2018-12-06 MED ORDER — MIDAZOLAM HCL 2 MG/2ML IJ SOLN
INTRAMUSCULAR | Status: AC
  Filled 2018-12-06: qty 2

## 2018-12-06 MED ORDER — LIDOCAINE-EPINEPHRINE 2 %-1:100000 IJ SOLN
INTRAMUSCULAR | Status: AC
  Filled 2018-12-06: qty 1

## 2018-12-06 MED ORDER — FENTANYL CITRATE (PF) 250 MCG/5ML IJ SOLN
INTRAMUSCULAR | Status: AC
  Filled 2018-12-06: qty 5

## 2018-12-06 MED ORDER — SODIUM CHLORIDE 0.9 % IV SOLN
250.0000 mL | INTRAVENOUS | Status: DC | PRN
  Administered 2018-12-12: 250 mL via INTRAVENOUS

## 2018-12-06 MED ORDER — FENTANYL CITRATE (PF) 100 MCG/2ML IJ SOLN
INTRAMUSCULAR | Status: DC | PRN
  Administered 2018-12-06: 50 ug via INTRAVENOUS

## 2018-12-06 MED ORDER — SODIUM CHLORIDE 0.9% FLUSH: 3.0000 mL | INTRAVENOUS | Status: DC | PRN

## 2018-12-06 MED ORDER — SCOPOLAMINE 1 MG/3DAYS TD PT72
MEDICATED_PATCH | TRANSDERMAL | Status: AC
  Filled 2018-12-06: qty 1

## 2018-12-06 MED ORDER — BUPIVACAINE HCL (PF) 0.5 % IJ SOLN
INTRAMUSCULAR | Status: DC | PRN
  Administered 2018-12-06: 10 mL

## 2018-12-06 MED ORDER — DEXAMETHASONE SODIUM PHOSPHATE 10 MG/ML IJ SOLN
INTRAMUSCULAR | Status: AC
  Filled 2018-12-06: qty 1

## 2018-12-06 MED ORDER — 0.9 % SODIUM CHLORIDE (POUR BTL) OPTIME
TOPICAL | Status: DC | PRN
  Administered 2018-12-06: 1000 mL

## 2018-12-06 MED ORDER — ROCURONIUM BROMIDE 10 MG/ML (PF) SYRINGE
PREFILLED_SYRINGE | INTRAVENOUS | Status: AC
  Filled 2018-12-06: qty 10

## 2018-12-06 MED ORDER — LIDOCAINE 2% (20 MG/ML) 5 ML SYRINGE
INTRAMUSCULAR | Status: DC | PRN
  Administered 2018-12-06: 100 mg via INTRAVENOUS

## 2018-12-06 MED ORDER — SUGAMMADEX SODIUM 500 MG/5ML IV SOLN
INTRAVENOUS | Status: AC
  Filled 2018-12-06: qty 10

## 2018-12-06 MED ORDER — BUPIVACAINE HCL (PF) 0.5 % IJ SOLN
INTRAMUSCULAR | Status: AC
  Filled 2018-12-06: qty 30

## 2018-12-06 MED ORDER — ACETAMINOPHEN 500 MG PO TABS
1000.0000 mg | ORAL_TABLET | Freq: Once | ORAL | Status: AC
  Administered 2018-12-06: 1000 mg via ORAL

## 2018-12-06 MED ORDER — SCOPOLAMINE 1 MG/3DAYS TD PT72
1.0000 | MEDICATED_PATCH | Freq: Once | TRANSDERMAL | Status: DC
  Administered 2018-12-06: 1.5 mg via TRANSDERMAL

## 2018-12-06 MED ORDER — SODIUM CHLORIDE 0.9% FLUSH
3.0000 mL | Freq: Two times a day (BID) | INTRAVENOUS | Status: DC
  Administered 2018-12-07 (×2): 3 mL via INTRAVENOUS

## 2018-12-06 MED ORDER — DEXAMETHASONE SODIUM PHOSPHATE 10 MG/ML IJ SOLN
INTRAMUSCULAR | Status: DC | PRN
  Administered 2018-12-06: 10 mg via INTRAVENOUS

## 2018-12-06 MED ORDER — ACETAMINOPHEN 500 MG PO TABS
ORAL_TABLET | ORAL | Status: AC
  Filled 2018-12-06: qty 2

## 2018-12-06 MED ORDER — LIDOCAINE HCL 2 % IJ SOLN
INTRAMUSCULAR | Status: AC
  Filled 2018-12-06: qty 20

## 2018-12-06 MED ORDER — SODIUM CHLORIDE 0.9 % IV SOLN
INTRAVENOUS | Status: DC | PRN
  Administered 2018-12-06: 50 ug/min via INTRAVENOUS

## 2018-12-06 MED ORDER — PHENYLEPHRINE 40 MCG/ML (10ML) SYRINGE FOR IV PUSH (FOR BLOOD PRESSURE SUPPORT)
PREFILLED_SYRINGE | INTRAVENOUS | Status: DC | PRN
  Administered 2018-12-06: 200 ug via INTRAVENOUS
  Administered 2018-12-06: 80 ug via INTRAVENOUS
  Administered 2018-12-06: 200 ug via INTRAVENOUS
  Administered 2018-12-06: 120 ug via INTRAVENOUS

## 2018-12-06 MED ORDER — LACTATED RINGERS IV SOLN
INTRAVENOUS | Status: DC
  Administered 2018-12-06: 09:00:00 via INTRAVENOUS

## 2018-12-06 MED ORDER — EPHEDRINE 5 MG/ML INJ
INTRAVENOUS | Status: AC
  Filled 2018-12-06: qty 10

## 2018-12-06 SURGICAL SUPPLY — 54 items
BANDAGE ACE 4X5 VEL STRL LF (GAUZE/BANDAGES/DRESSINGS) ×3 IMPLANT
BENZOIN TINCTURE PRP APPL 2/3 (GAUZE/BANDAGES/DRESSINGS) ×3 IMPLANT
BLADE AVERAGE 25MMX9MM (BLADE)
BLADE AVERAGE 25X9 (BLADE) IMPLANT
BLADE OSCILLATING /SAGITTAL (BLADE) ×3 IMPLANT
BLADE SURG 15 STRL LF DISP TIS (BLADE) IMPLANT
BLADE SURG 15 STRL SS (BLADE)
BNDG GAUZE ELAST 4 BULKY (GAUZE/BANDAGES/DRESSINGS) ×3 IMPLANT
CHLORAPREP W/TINT 26 (MISCELLANEOUS) IMPLANT
CLOSURE WOUND 1/2 X4 (GAUZE/BANDAGES/DRESSINGS) ×1
CONT SPEC 4OZ CLIKSEAL STRL BL (MISCELLANEOUS) ×6 IMPLANT
COVER SURGICAL LIGHT HANDLE (MISCELLANEOUS) ×3 IMPLANT
COVER WAND RF STERILE (DRAPES) ×3 IMPLANT
CUFF TOURN SGL QUICK 18X4 (TOURNIQUET CUFF) ×3 IMPLANT
CUFF TOURN SGL QUICK 24 (TOURNIQUET CUFF)
CUFF TOURNIQUET SINGLE 18IN (TOURNIQUET CUFF) ×3 IMPLANT
CUFF TRNQT CYL 24X4X16.5-23 (TOURNIQUET CUFF) IMPLANT
DRAPE OEC MINIVIEW 54X84 (DRAPES) ×3 IMPLANT
DRSG PAD ABDOMINAL 8X10 ST (GAUZE/BANDAGES/DRESSINGS) ×3 IMPLANT
ELECT REM PT RETURN 9FT ADLT (ELECTROSURGICAL)
ELECTRODE REM PT RTRN 9FT ADLT (ELECTROSURGICAL) IMPLANT
GAUZE PACKING IODOFORM 1/4X15 (GAUZE/BANDAGES/DRESSINGS) ×3 IMPLANT
GAUZE SPONGE 4X4 12PLY STRL (GAUZE/BANDAGES/DRESSINGS) ×3 IMPLANT
GAUZE XEROFORM 1X8 LF (GAUZE/BANDAGES/DRESSINGS) ×3 IMPLANT
GLOVE BIO SURGEON STRL SZ8 (GLOVE) ×3 IMPLANT
GLOVE BIOGEL PI IND STRL 8 (GLOVE) ×1 IMPLANT
GLOVE BIOGEL PI INDICATOR 8 (GLOVE) ×2
GOWN STRL REUS W/ TWL LRG LVL3 (GOWN DISPOSABLE) ×2 IMPLANT
GOWN STRL REUS W/TWL LRG LVL3 (GOWN DISPOSABLE) ×4
KIT BASIN OR (CUSTOM PROCEDURE TRAY) ×3 IMPLANT
KIT TURNOVER KIT B (KITS) ×3 IMPLANT
NEEDLE 27GAX1X1/2 (NEEDLE) ×3 IMPLANT
NEEDLE BIOPSY JAMSHIDI 8X6 (NEEDLE) ×6 IMPLANT
NS IRRIG 1000ML POUR BTL (IV SOLUTION) ×3 IMPLANT
PACK ORTHO EXTREMITY (CUSTOM PROCEDURE TRAY) ×3 IMPLANT
PAD ARMBOARD 7.5X6 YLW CONV (MISCELLANEOUS) ×6 IMPLANT
PAD CAST 4YDX4 CTTN HI CHSV (CAST SUPPLIES) IMPLANT
PADDING CAST COTTON 4X4 STRL (CAST SUPPLIES)
SCRUB BETADINE 4OZ XXX (MISCELLANEOUS) ×3 IMPLANT
SOL PREP POV-IOD 4OZ 10% (MISCELLANEOUS) IMPLANT
STAPLER VISISTAT 35W (STAPLE) ×3 IMPLANT
STRIP CLOSURE SKIN 1/2X4 (GAUZE/BANDAGES/DRESSINGS) ×2 IMPLANT
SUT ETHILON 3 0 FSL (SUTURE) ×3 IMPLANT
SUT PROLENE 3 0 PS 2 (SUTURE) ×3 IMPLANT
SUT PROLENE 4 0 PS 2 18 (SUTURE) ×6 IMPLANT
SUT VIC AB 3-0 PS2 18 (SUTURE) IMPLANT
SUT VICRYL 4-0 PS2 18IN ABS (SUTURE) ×3 IMPLANT
SWAB COLLECTION DEVICE MRSA (MISCELLANEOUS) ×3 IMPLANT
SWAB CULTURE ESWAB REG 1ML (MISCELLANEOUS) ×3 IMPLANT
SYR CONTROL 10ML LL (SYRINGE) ×6 IMPLANT
TOWEL NATURAL 10PK STERILE (DISPOSABLE) ×3 IMPLANT
TUBE CONNECTING 12'X1/4 (SUCTIONS) ×1
TUBE CONNECTING 12X1/4 (SUCTIONS) ×2 IMPLANT
YANKAUER SUCT BULB TIP NO VENT (SUCTIONS) IMPLANT

## 2018-12-06 NOTE — Progress Notes (Signed)
Echocardiogram 2D Echocardiogram has been performed.  [name] Olivia Romero 12/06/2018, 3:35 PM

## 2018-12-06 NOTE — Brief Op Note (Signed)
12/06/2018  11:35 AM  PATIENT:  Olivia Romero  63 y.o. female  PRE-OPERATIVE DIAGNOSIS:  OSTEOMEYLITIS LEFT FOOT  POST-OPERATIVE DIAGNOSIS:  OSTEOMEYLITIS LEFT FOOT  PROCEDURE:  Procedure(s): PARTIAL AMPUTATION LEFT FOOT (Left) Irrigation And Debridement Foot (Left) Bone Biopsy (Left)  SURGEON:  Surgeon(s) and Role:    Edrick Kins, DPM - Primary  PHYSICIAN ASSISTANT:   ASSISTANTS: none   ANESTHESIA:   general  EBL:  Minimal   BLOOD ADMINISTERED:none  DRAINS: none   LOCAL MEDICATIONS USED:  MARCAINE    and LIDOCAINE   SPECIMEN:  Source of Specimen:  Bone biopsy, 2 separate. Talus. Navicular.  DISPOSITION OF SPECIMEN:  PATHOLOGY  COUNTS:  YES  TOURNIQUET:   Total Tourniquet Time Documented: Thigh (Left) - -[id number] minutes Total: Thigh (Left) - -[id number] minutes   DICTATION: .Viviann Spare Dictation  PLAN OF CARE: Admit to inpatient   PATIENT DISPOSITION:  PACU - hemodynamically stable.   Delay start of Pharmacological VTE agent (>24hrs) due to surgical blood loss or risk of bleeding: no

## 2018-12-06 NOTE — Transfer of Care (Signed)
Immediate Anesthesia Transfer of Care Note  Patient: Olivia Romero  Procedure(s) Performed: PARTIAL AMPUTATION LEFT FOOT (Left Foot) Irrigation And Debridement Foot (Left Foot) Bone Biopsy (Left Foot)  Patient Location: PACU  Anesthesia Type:General  Level of Consciousness: awake and alert   Airway & Oxygen Therapy: Patient Spontanous Breathing  Post-op Assessment: Report given to RN and Post -op Vital signs reviewed and stable  Post vital signs: Reviewed and stable  Last Vitals:  Vitals Value Taken Time  BP 94/33 12/06/18 1124  Temp    Pulse 104 12/06/18 1124  Resp 18 12/06/18 1124  SpO2 93 % 12/06/18 1124  Vitals shown include unvalidated device data.  Last Pain:  Vitals:   12/06/18 0800  TempSrc:   PainSc: 0-No pain      Patients Stated Pain Goal: 2 (38/37/79 3968)  Complications: No apparent anesthesia complications

## 2018-12-06 NOTE — Op Note (Signed)
OPERATIVE REPORT Patient name: Olivia Romero MRN: [id number] DOB: [birth year]  DOS:  12/06/2018  Preop Dx: Osteomyelitis left foot.  Cellulitis left foot.  Postop Dx: same  Procedure:  1.  Partial foot amputation left 2.  Bone biopsy left x2 3.  Irrigation and debridement left  Surgeon: Edrick Kins DPM  Anesthesia: General anesthesia  Hemostasis: Ankle tourniquet inflated to a pressure of 270mmHg without Esmarch exsanguination   EBL: Minimal mL Materials: None Injectables: 10 cc of 2% lidocaine with 10 cc of 0.5% Marcaine plain in an ankle block fashion  Pathology:  1. Deep wound cultures taken and sent to pathology for C&S 2.  Bone biopsy talar neck left 3.  Bone biopsy navicular left  Condition: The patient tolerated the procedure and anesthesia well. No complications noted or reported   Justification for procedure: The patient is a 63 y.o. female history of uncontrolled diabetes mellitus who presents today for surgical correction of osteomyelitis to the lateral aspect of the left foot complicated by history of Charcot. All conservative modalities of been unsuccessful in providing any sort of satisfactory alleviation of symptoms with the patient. The patient was told benefits as well as possible side effects of the surgery. The patient consented for surgical correction. The patient consent form was reviewed. All patient questions were answered. No guarantees were expressed or implied. The patient and the surgeon boson the patient consent form with the witness present and placed in the patient's chart.   Procedure in Detail: The patient was brought to the operating room, placed in the operating table in the supine position at which time an aseptic scrub and drape were performed about the patient's respective lower extremity after anesthesia was induced as described above. Attention was then directed to the surgical area where procedure number one commenced.  Procedure  #1: Partial foot amputation left Long incision was planned and made over the lateral aspect of the left foot encompassing digits 4 and 5 as well as the ulceration to the lateral aspect of the foot.  Incision was carried down to the level of bone with care taken to cut clamp ligate or retract well small neurovascular structures traversing the incision site.  The entire fourth and fifth rays were surgically removed in toto as well as a portion in toto as well as portion of the cuboid. as well as a portion of the cuboid using a combination of sharp soft tissue dissection and Saggittal blade.  Intra-Op x-ray fluoroscopy was utilized to visualize the removal bone which was satisfactory.  Procedure #2: Irrigation debridement left All necrotic nonviable tissue and tendons within the amputation site were visualized and sharply dissected away.  Copious irrigation using pulse lavage and 3 L of normal saline was utilized.  The soft tissue within the incision site appeared very healthy and viable and intraoperative decision was made for primary closure.  Stainless steel skin staples in combination with suture was utilized for primary closure.  Packing was applied within the incision site.  Procedure 3: Bone biopsy talus left 0.5 cm stab incision was planned and made overlying the neck of the talus left foot.  A curved mosquito was utilized for blunt dissection down to the level of the talar leg neck and bone.  A Jamshidi bone biopsy needle was utilized to obtain bone biopsy which was placed in sterile specimen container and sent to pathology.  Incision was primarily closed using Prolene suture  Procedure #4: Bone biopsy navicular left Another 0.5 cm  stab incision was planned and made overlying the navicular tuberosity of the left foot.  The curved mosquito was utilized for blunt dissection down the level of the navicular bone.  A Jamshidi bone biopsy needle was utilized to obtain bone biopsy which was placed in  separate sterile specimen container and sent to pathology.  Stab incision was primary closed using Prolene suture  Dry sterile compressive dressings were then applied to all previously mentioned incision sites about the patient's lower extremity. The tourniquet which was used for hemostasis was deflated. All normal neurovascular responses including pink color and warmth returned all the digits of patient's lower extremity.  The patient was then transferred from the operating room to the recovery room having tolerated the procedure and anesthesia well. All vital signs are stable. After a brief stay in the recovery room the patient was readmitted to inpatient room with standing orders analgesia. Verbal as well as written instructions were provided for the patient regarding wound care. The patient is to keep the dressings clean dry and intact until they are to follow surgeon Dr. Daylene Katayama.  Surgical impression: Soft tissue appeared healthy and viable with minimal amounts of purulence.  Bone did not appear to be as necrotic as anticipated.  I do believe that the patient suffering more from a Charcot pathology versus diffuse osteomyelitis.  Edrick Kins, DPM [facility]  Dr. Edrick Kins, [address]                                        [place], [place] [address]                Office [phone]  Fax [phone]

## 2018-12-06 NOTE — Progress Notes (Signed)
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 DULoxetine  60 mg Oral QHS  . gabapentin  800 mg Oral TID  . heparin  5,000 Units Subcutaneous Q8H  . hydrochlorothiazide  12.5 mg Oral Daily  . insulin aspart  0-15 Units Subcutaneous TID WC  . insulin aspart  0-5 Units Subcutaneous QHS  . pantoprazole  40 mg Oral Daily  . povidone-iodine  2 application Topical Once  . sodium chloride flush  3 mL Intravenous Once     ASSESSMENT: Olivia Romero is a 63 yo F w/ PMH of T2DM and Chronic DFU admit for diabetic foot infection and MRSA bacteremia on hospital day 3  PLAN: 1. MRSA bacteremia: BCID + MRSA, Blood culture with staph aureus but pending sensitivities. Repeat blood cultures drawn yesterday w/ NGTD @ 12 hrs. Temp peak of 101.69F overnight. WBC 16.0 -> 9.8. High likelihood of multi-resistance considering multiple rounds of antimicrobial therapy in the past for staph hemolyticus, pseudomonas and klebsiella  - F/u TTE, may also need TEE as she is high risk for endocarditis considering chronic infection  - F/u repeat blood culture  - C/w vancomcyin  - PICC line once bacteremia clears  2.   Left metatarsal osteomyelitis: Planned for partial lateral foot amputation this AM.  Bone biopsies planned in OR. Foot pain well controlled.  - F/u wound culture from OR  SUBJECTIVE: Mrs.Schouten was examined and evaluated at bedside this AM. She states she had a fever  overnight but mentions that she has been intermittently febrile ever since she has been in the hospital. She mentions she spent a lot of time thinking about her surgery but feels she is ready. Denies any nausea, vomiting, abdominal pain, diarrhea. Her foot pain is well controlled. Discussed about importance of source control with bacteremia and expected clinical improvement after amputation. Ms.Casebeer expressed understanding.  Review of Systems: Review of Systems  Constitutional: Positive for chills and fever. Negative for malaise/fatigue.  Respiratory: Negative for shortness of breath.   Cardiovascular: Negative for chest pain and palpitations.  Gastrointestinal: Negative for abdominal pain, diarrhea, nausea and vomiting.  Genitourinary: Negative for dysuria and frequency.   Past Medical History:  Diagnosis Date  . Anemia   . Arthritis   . Asthma   . Carpal tunnel syndrome on right   . Colon polyps    adenomatous  . Diabetes mellitus without complication ([place])   . Diabetic infection of left foot ([place]) 12/2018  . Diverticulosis of colon   . Esophagitis   . GERD (gastroesophageal reflux disease)   . Hemorrhoid    internal  . Hyperlipemia   . Hypertension   . Myalgia due to statin 11/19/2017  . Neuropathy   . Pneumonia   . PONV (postoperative nausea and vomiting)   . Skin ulcer of right ankle, limited to breakdown of skin ([place]) 12/31/2016  .  Sleep apnea    has had in the past lost 50 pounds and do longer uses cpap  . Statin intolerance 04/23/2015  . Stroke-like episode ([place]) 2009   TIA  . 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 ([place]) 07/13/2015  . Uncontrolled type 2 diabetes mellitus with hyperglycemia ([place]) 11/01/2017    Social History   Tobacco Use  . Smoking status: Never Smoker  . Smokeless tobacco: Never Used  Substance Use Topics  . Alcohol use: Never    Frequency: Never  . Drug use: No    Family History  Problem Relation Age of Onset  . Lung  cancer Father   . Hypertension Father   . Arthritis Father   . Other Mother        hardening of the arteries/renal cell carcenoma  . Hypertension Mother   . Stroke Mother   . Kidney cancer Mother   . Arthritis Brother   . Rheum arthritis Maternal Uncle   . Bladder Cancer Neg Hx    Allergies  Allergen Reactions  . Eggs Or Egg-Derived Products Swelling and Other (See Comments)    Angioedema  . Atorvastatin Other (See Comments)    Liver toxicity  . Flu Virus Vaccine Swelling    Arm swelled (site of injection)  . Latex Itching  . Pravastatin Itching and Rash  . Tape Rash and Other (See Comments)    TAPE PULLS OFF THE SKIN!!! Please use an alternative!!    OBJECTIVE: Vitals:   12/05/18 1640 12/05/18 2027 12/06/18 0305 12/06/18 0731  BP: (!) 112/48 (!) 142/33 (!) 131/53 (!) 116/57  Pulse: 80 85 72 77  Resp: 17 16 14 16   Temp: 98.2 F (36.8 C) (!) 101.3 F (38.5 C) 98.4 F (36.9 C) 98.3 F (36.8 C)  TempSrc: Oral Oral Oral Oral  SpO2: 100% 100% 99% 99%   There is no height or weight on file to calculate BMI.  Physical Exam Constitutional:      General: She is not in acute distress.    Appearance: She is obese.  Cardiovascular:     Rate and Rhythm: Normal rate and regular rhythm.     Pulses: Normal pulses.  Pulmonary:     Effort: Pulmonary effort is normal.     Breath sounds: Normal breath sounds. No wheezing or rales.  Abdominal:     General: Abdomen is flat. Bowel sounds are normal. There is no distension.     Tenderness: There is no abdominal tenderness.  Musculoskeletal: Normal range of motion.        General: Swelling (Left foot with non-pitting edema, warm to touch.) present.  Skin:    General: Skin is warm and dry.  Neurological:     Mental Status: She is alert.     Sensory: Sensory deficit (Left lower extremity numbness) present.    Lab Results Lab Results  Component Value Date   WBC 9.8 12/06/2018   HGB 9.5 (L) 12/06/2018   HCT 29.9 (L) 12/06/2018    MCV 83.1 12/06/2018   PLT 400 12/06/2018    Lab Results  Component Value Date   CREATININE 0.94 12/06/2018   BUN 14 12/05/2018   NA 137 12/05/2018   K 3.9 12/05/2018   CL 103 12/05/2018   CO2 24 12/05/2018    Lab Results  Component Value Date   ALT 19 12/04/2018   AST 20 12/04/2018   ALKPHOS 68 12/04/2018   BILITOT 0.3 12/04/2018     Microbiology: Recent Results (from  the past 240 hour(s))  Novel Coronavirus,NAA,(SEND-OUT TO REF LAB - TAT 24-48 hrs); Hosp Order     Status: None   Collection Time: 11/27/18 11:34 PM   Specimen: Nasopharyngeal Swab; Respiratory  Result Value Ref Range Status   SARS-CoV-2, NAA NOT DETECTED NOT DETECTED Final    Comment: (NOTE) This test was developed and its performance characteristics determined by [employer]. This test has not been FDA cleared or approved. This test has been authorized by FDA under an Emergency Use Authorization (EUA). This test is only authorized for the duration of time the declaration that circumstances exist justifying the authorization of the emergency use of in vitro diagnostic tests for detection of SARS-CoV-2 virus and/or diagnosis of COVID-19 infection under section 564(b)(1) of the Act, 21 U.S.C. 621HYQ-6(V)(7), unless the authorization is terminated or revoked sooner. When diagnostic testing is negative, the possibility of a false negative result should be considered in the context of a patient's recent exposures and the presence of clinical signs and symptoms consistent with COVID-19. An individual without symptoms of COVID-19 and who is not shedding SARS-CoV-2 virus would expect to have a negative (not detected) result in this assay. Performed  At: [facility] [place], [place] [id number] Rush Farmer MD WU:[id number]    [place]  Final    Comment: Performed at [facility], [address] [place], [place] [address]  WOUND CULTURE      Status: Abnormal   Collection Time: 12/03/18  4:36 PM   Specimen: Foot, Left; Wound  Result Value Ref Range Status   MICRO NUMBER: [id number]  Final   SPECIMEN QUALITY: Adequate  Final   SOURCE: FOOT, LEFT  Final   STATUS: FINAL  Final   GRAM STAIN:   Final    No white blood cells seen Few epithelial cells Rare Gram positive cocci in pairs Rare Gram positive bacilli   ISOLATE 1: methicillin resistant Staphylococcus aureus (A)  Final    Comment: Heavy growth of Methicillin resistant Staphylococcus aureus (MRSA) Negative for inducible clindamycin resistance.      Susceptibility   Methicillin resistant staphylococcus aureus - AEROBIC CULT, GRAM STAIN POSITIVE 1    VANCOMYCIN 1 Sensitive     CIPROFLOXACIN >=8 Resistant     CLINDAMYCIN <=0.25 Sensitive     LEVOFLOXACIN >=8 Resistant     ERYTHROMYCIN >=8 Resistant     GENTAMICIN <=0.5 Sensitive     OXACILLIN* NR Resistant      * Oxacillin-resistant staphylococci are resistant toall currently available beta-lactam antimicrobialagents including penicillins, beta lactam/beta-lactamase inhibitor combinations, and cephems withstaphylococcal indications, including Cefazolin.    TETRACYCLINE <=1 Sensitive     TRIMETH/SULFA* >=320 Resistant      * Oxacillin-resistant staphylococci are resistant toall currently available beta-lactam antimicrobialagents including penicillins, beta lactam/beta-lactamase inhibitor combinations, and cephems withstaphylococcal indications, including Cefazolin.Legend:S = Susceptible  I = IntermediateR = Resistant  NS = Not susceptible* = Not tested  NR = Not reported**NN = See antimicrobic comments  Blood culture (routine x 2)     Status: None (Preliminary result)   Collection Time: 12/04/18  3:55 PM   Specimen: BLOOD  Result Value Ref Range Status   Specimen Description BLOOD RIGHT ANTECUBITAL  Final   Special Requests   Final    BOTTLES DRAWN AEROBIC AND ANAEROBIC Blood Culture results may not be optimal due to an  inadequate volume of blood received in culture bottles   Culture   Final    NO GROWTH <  24 HOURS Performed at [facility], [place] [address] [place], [place] [address]    Report Status PENDING  Incomplete  Urine culture     Status: None   Collection Time: 12/04/18  5:12 PM   Specimen: Urine, Random  Result Value Ref Range Status   Specimen Description URINE, RANDOM  Final   Special Requests NONE  Final   Culture   Final    NO GROWTH Performed at [facility], [place] [address], [place], [place] [address]    Report Status 12/05/2018 FINAL  Final  SARS Coronavirus 2 (CEPHEID- Performed in [facility] hospital lab), Hosp Order     Status: None   Collection Time: 12/04/18  5:28 PM   Specimen: Nasopharyngeal Swab  Result Value Ref Range Status   SARS Coronavirus 2 NEGATIVE NEGATIVE Final    Comment: (NOTE) If result is NEGATIVE SARS-CoV-2 target nucleic acids are NOT DETECTED. The SARS-CoV-2 RNA is generally detectable in upper and lower  respiratory specimens during the acute phase of infection. The lowest  concentration of SARS-CoV-2 viral copies this assay can detect is 250  copies / mL. A negative result does not preclude SARS-CoV-2 infection  and should not be used as the sole basis for treatment or other  patient management decisions.  A negative result may occur with  improper specimen collection / handling, submission of specimen other  than nasopharyngeal swab, presence of viral mutation(s) within the  areas targeted by this assay, and inadequate number of viral copies  (<250 copies / mL). A negative result must be combined with clinical  observations, patient history, and epidemiological information. If result is POSITIVE SARS-CoV-2 target nucleic acids are DETECTED. The SARS-CoV-2 RNA is generally detectable in upper and lower  respiratory specimens dur ing the acute phase of infection.  Positive  results are indicative of active infection with SARS-CoV-2.   Clinical  correlation with patient history and other diagnostic information is  necessary to determine patient infection status.  Positive results do  not rule out bacterial infection or co-infection with other viruses. If result is PRESUMPTIVE POSTIVE SARS-CoV-2 nucleic acids MAY BE PRESENT.   A presumptive positive result was obtained on the submitted specimen  and confirmed on repeat testing.  While 2019 novel coronavirus  (SARS-CoV-2) nucleic acids may be present in the submitted sample  additional confirmatory testing may be necessary for epidemiological  and / or clinical management purposes  to differentiate between  SARS-CoV-2 and other Sarbecovirus currently known to infect humans.  If clinically indicated additional testing with an alternate test  methodology [phone]) is advised. The SARS-CoV-2 RNA is generally  detectable in upper and lower respiratory sp ecimens during the acute  phase of infection. The expected result is Negative. Fact Sheet for Patients:  StrictlyIdeas.no Fact Sheet for Healthcare Providers: BankingDealers.co.za This test is not yet approved or cleared by the [place] FDA and has been authorized for detection and/or diagnosis of SARS-CoV-2 by FDA under an Emergency Use Authorization (EUA).  This EUA will remain in effect (meaning this test can be used) for the duration of the COVID-19 declaration under Section 564(b)(1) of the Act, 21 U.S.C. section 360bbb-3(b)(1), unless the authorization is terminated or revoked sooner. Performed at [facility], [place] [address], [place], [place] [address]   Blood culture (routine x 2)     Status: None (Preliminary result)   Collection Time: 12/04/18  5:37 PM   Specimen: BLOOD  Result Value Ref  Range Status   Specimen Description BLOOD LEFT ANTECUBITAL  Final   Special Requests   Final    BOTTLES DRAWN AEROBIC AND ANAEROBIC Blood Culture adequate volume    Culture  Setup Time   Final    GRAM POSITIVE COCCI IN CLUSTERS IN BOTH AEROBIC AND ANAEROBIC BOTTLES CRITICAL RESULT CALLED TO, READ BACK BY AND VERIFIED WITH: [place] [id number] AT 63 BY CM Performed at [facility], [place] [address], [place], [place] [address]    Culture GRAM POSITIVE COCCI  Final   Report Status PENDING  Incomplete  Blood Culture ID Panel (Reflexed)     Status: Abnormal   Collection Time: 12/04/18  5:37 PM  Result Value Ref Range Status   Enterococcus species NOT DETECTED NOT DETECTED Final   Listeria monocytogenes NOT DETECTED NOT DETECTED Final   Staphylococcus species DETECTED (A) NOT DETECTED Final    Comment: CRITICAL RESULT CALLED TO, READ BACK BY AND VERIFIED WITH: PHARMD E SINCLAIRE [id number] AT 1310 BY CM    Staphylococcus aureus (BCID) DETECTED (A) NOT DETECTED Final    Comment: Methicillin (oxacillin)-resistant Staphylococcus aureus (MRSA). MRSA is predictably resistant to beta-lactam antibiotics (except ceftaroline). Preferred therapy is vancomycin unless clinically contraindicated. Patient requires contact precautions if  hospitalized. CRITICAL RESULT CALLED TO, READ BACK BY AND VERIFIED WITH: PHARMD E [place] [id number] AT 1310 BY CM    Methicillin resistance DETECTED (A) NOT DETECTED Final    Comment: CRITICAL RESULT CALLED TO, READ BACK BY AND VERIFIED WITH: PHARMD E [place] [id number] AT 1310 BY CM    Streptococcus species NOT DETECTED NOT DETECTED Final   Streptococcus agalactiae NOT DETECTED NOT DETECTED Final   Streptococcus pneumoniae NOT DETECTED NOT DETECTED Final   Streptococcus pyogenes NOT DETECTED NOT DETECTED Final   Acinetobacter baumannii NOT DETECTED NOT DETECTED Final   Enterobacteriaceae species NOT DETECTED NOT DETECTED Final   Enterobacter cloacae complex NOT DETECTED NOT DETECTED Final   Escherichia coli NOT DETECTED NOT DETECTED Final   Klebsiella oxytoca NOT DETECTED NOT DETECTED Final   Klebsiella pneumoniae NOT  DETECTED NOT DETECTED Final   Proteus species NOT DETECTED NOT DETECTED Final   Serratia marcescens NOT DETECTED NOT DETECTED Final   Haemophilus influenzae NOT DETECTED NOT DETECTED Final   Neisseria meningitidis NOT DETECTED NOT DETECTED Final   Pseudomonas aeruginosa NOT DETECTED NOT DETECTED Final   Candida albicans NOT DETECTED NOT DETECTED Final   Candida glabrata NOT DETECTED NOT DETECTED Final   Candida krusei NOT DETECTED NOT DETECTED Final   Candida parapsilosis NOT DETECTED NOT DETECTED Final   Candida tropicalis NOT DETECTED NOT DETECTED Final    Comment: Performed at [facility], [address], [place], [place] [address]  MRSA PCR Screening     Status: None   Collection Time: 12/05/18  5:22 AM   Specimen: Nasal Mucosa; Nasopharyngeal  Result Value Ref Range Status   MRSA by PCR NEGATIVE NEGATIVE Final    Comment:        The GeneXpert MRSA Assay (FDA approved for NASAL specimens only), is one component of a comprehensive MRSA colonization surveillance program. It is not intended to diagnose MRSA infection nor to guide or monitor treatment for MRSA infections. Performed at [facility], [place] [address] [place],  [address]    Mosetta Anis, [facility] [phone] pager   [phone] cell 12/06/2018, 7:44 AM

## 2018-12-06 NOTE — Anesthesia Preprocedure Evaluation (Signed)
Anesthesia Evaluation  Patient identified by MRN, date of birth, ID band Patient awake    Reviewed: Allergy & Precautions, NPO status , Patient's Chart, lab work & pertinent test results  History of Anesthesia Complications (+) PONV and history of anesthetic complications  Airway Mallampati: II  TM Distance: >3 FB Neck ROM: Full    Dental  (+) Teeth Intact, Dental Advisory Given   Pulmonary asthma , sleep apnea ,    Pulmonary exam normal breath sounds clear to auscultation       Cardiovascular hypertension, Pt. on medications (-) angina+ Peripheral Vascular Disease  (-) CAD, (-) Past MI and (-) Cardiac Stents Normal cardiovascular exam Rhythm:Regular Rate:Normal     Neuro/Psych TIA Neuromuscular disease    GI/Hepatic Neg liver ROS, GERD  Medicated and Controlled,  Endo/Other  negative endocrine ROSdiabetes, Type 2, Insulin Dependent, Oral Hypoglycemic AgentsObesity   Renal/GU Renal disease     Musculoskeletal  (+) Arthritis ,   Abdominal   Peds  Hematology  (+) Blood dyscrasia, anemia ,   Anesthesia Other Findings Day of surgery medications reviewed with the patient.  Reproductive/Obstetrics                             Anesthesia Physical Anesthesia Plan  ASA: III  Anesthesia Plan: General   Post-op Pain Management:    Induction: Intravenous  PONV Risk Score and Plan: 4 or greater and Midazolam, Dexamethasone, Ondansetron and Scopolamine patch - Pre-op  Airway Management Planned: LMA  Additional Equipment:   Intra-op Plan:   Post-operative Plan: Extubation in OR  Informed Consent: I have reviewed the patients History and Physical, chart, labs and discussed the procedure including the risks, benefits and alternatives for the proposed anesthesia with the patient or authorized representative who has indicated his/her understanding and acceptance.     Dental advisory  given  Plan Discussed with: CRNA  Anesthesia Plan Comments:         Anesthesia Quick Evaluation

## 2018-12-06 NOTE — Progress Notes (Signed)
Family Medicine Teaching Service Daily Progress Note Intern Pager: [phone]  Patient name: Olivia Romero Medical record number: [id number] Date of birth: June 29, 1955 Age: 63 y.o. Gender: female  Primary Care Provider: Arnetha Courser, MD Consultants: Podiatry Code Status: Full  Pt Overview and Major Events to Date:  07/02 MRI Lt foot  Assessment and Plan:   Sepsis 2/2 diabetic L foot ulcer and r/u osteomyelitis  No temp overnight. V/s stable Continue Antibiotics Podiatry   Poorly controlled T2DM with peripheral neuropathy  CBG 91, 270  Continue CBG monitoring and s/s insulin Continue Gabapentin   FEN/GI: Protonix PPx: Heparin  Disposition: observe overnight, pt scheduled for OR 07/03 per podiatry, PT/OT consulted   Subjective:  Pt seen and assessed.  Pleasant and cooperative.  Pain controlled with Acetaminophen. NPO for procedure scheduled at 0930.     Objective: Temp:  [98.2 F (36.8 C)-101.3 F (38.5 C)] 98.4 F (36.9 C) (07/03 0305) Pulse Rate:  [72-86] 72 (07/03 0305) Resp:  [14-17] 14 (07/03 0305) BP: (112-142)/(33-57) 131/53 (07/03 0305) SpO2:  [99 %-100 %] 99 % (07/03 0305) Physical Exam: General: A7O x3, NAD Cardiovascular: RRR, no murmurs Respiratory: CTAB, on room air Abdomen: soft, nontender, BS present in all quadrants,  Extremities: distal pulses present, Lt foot edematous, small ulcer noted to Lt lateral area, sinus tracking to 9 o'clock and 3 o'clock area with some purulent drainage   Laboratory: Recent Labs  Lab 12/04/18 1615 12/05/18 0803 12/06/18 0220  WBC 17.2* 16.0* 9.8  HGB 11.4* 9.6* 9.5*  HCT 36.1 30.1* 29.9*  PLT 468* 410* 400   Recent Labs  Lab 12/04/18 1134 12/04/18 1615 12/05/18 0803 12/06/18 0220  NA 137 132* 137  --   K 5.5* 4.0 3.9  --   CL 97 98 103  --   CO2 23 21* 24  --   BUN 21 23 14   --   CREATININE 0.67 0.91 0.78 0.94  CALCIUM 9.6 9.1 8.8*  --   PROT  --  7.8  --   --   BILITOT  --  0.3  --   --    ALKPHOS  --  68  --   --   ALT  --  19  --   --   AST  --  20  --   --   GLUCOSE 108* 266* 150*  --       Imaging/Diagnostic Tests: MRI Lt foot- IMPRESSION: 1. MR findings consistent with septic arthritis at the cuboid articulation with the fourth and fifth metatarsal bases with osteomyelitis in the fourth and fifth metatarsals and the distal lateral cuboid. 2. Cellulitis and myositis. Massive soft tissue thickening along the lateral aspect of the foot showing lack of enhancement and worrisome for necrotic tissue. There is also a suspected 2 cm abscess in the soft tissues. 3. Nonspecific edema like signal abnormality and some enhancement in the talus and navicular bones. This may be stress related but could not totally exclude the possibility of osteomyelitis.   Electronically Signed   By: Marijo Sanes M.D.   On: 12/05/2018 08:10  Carollee Leitz, MD 12/06/2018, 6:21 AM PGY1, [facility] Intern pager: [phone], text pages welcome

## 2018-12-06 NOTE — Anesthesia Postprocedure Evaluation (Signed)
Anesthesia Post Note  Patient: Olivia Romero  Procedure(s) Performed: PARTIAL AMPUTATION LEFT FOOT (Left Foot) Irrigation And Debridement Foot (Left Foot) Bone Biopsy (Left Foot)     Patient location during evaluation: PACU Anesthesia Type: General Level of consciousness: awake and alert, oriented and awake Pain management: pain level controlled Vital Signs Assessment: post-procedure vital signs reviewed and stable Respiratory status: spontaneous breathing, nonlabored ventilation, respiratory function stable and patient connected to nasal cannula oxygen Cardiovascular status: blood pressure returned to baseline and stable Postop Assessment: no apparent nausea or vomiting Anesthetic complications: no    Last Vitals:  Vitals:   12/06/18 1200 12/06/18 1220  BP:  (!) 101/50  Pulse:  86  Resp:  18  Temp: (!) 36.3 C 36.8 C  SpO2:  98%    Last Pain:  Vitals:   12/06/18 1220  TempSrc: Oral  PainSc:                  Catalina Gravel

## 2018-12-07 ENCOUNTER — Inpatient Hospital Stay: Payer: Self-pay

## 2018-12-07 ENCOUNTER — Encounter (HOSPITAL_COMMUNITY): Payer: Self-pay | Admitting: Podiatry

## 2018-12-07 DIAGNOSIS — L089 Local infection of the skin and subcutaneous tissue, unspecified: Secondary | ICD-10-CM

## 2018-12-07 DIAGNOSIS — Z89422 Acquired absence of other left toe(s): Secondary | ICD-10-CM

## 2018-12-07 DIAGNOSIS — E11628 Type 2 diabetes mellitus with other skin complications: Secondary | ICD-10-CM

## 2018-12-07 LAB — IRON AND TIBC
Iron: 8 ug/dL — ABNORMAL LOW (ref 28–170)
Saturation Ratios: 3 % — ABNORMAL LOW (ref 10.4–31.8)
TIBC: 319 ug/dL (ref 250–450)
UIBC: 311 ug/dL

## 2018-12-07 LAB — GLUCOSE, CAPILLARY
Glucose-Capillary: 207 mg/dL — ABNORMAL HIGH (ref 70–99)
Glucose-Capillary: 207 mg/dL — ABNORMAL HIGH (ref 70–99)
Glucose-Capillary: 212 mg/dL — ABNORMAL HIGH (ref 70–99)
Glucose-Capillary: 239 mg/dL — ABNORMAL HIGH (ref 70–99)

## 2018-12-07 LAB — BASIC METABOLIC PANEL
Anion gap: 8 (ref 5–15)
BUN: 18 mg/dL (ref 8–23)
CO2: 27 mmol/L (ref 22–32)
Calcium: 8.9 mg/dL (ref 8.9–10.3)
Chloride: 101 mmol/L (ref 98–111)
Creatinine, Ser: 0.85 mg/dL (ref 0.44–1.00)
GFR calc Af Amer: >60 mL/min (ref >60)
GFR calc non Af Amer: >60 mL/min (ref >60)
Glucose, Bld: 245 mg/dL — ABNORMAL HIGH (ref 70–99)
Potassium: 4.8 mmol/L (ref 3.5–5.1)
Sodium: 136 mmol/L (ref 135–145)

## 2018-12-07 LAB — FERRITIN: Ferritin: 144 ng/mL (ref 11–307)

## 2018-12-07 LAB — CBC
HCT: 28.4 % — ABNORMAL LOW (ref 36.0–46.0)
Hemoglobin: 8.8 g/dL — ABNORMAL LOW (ref 12.0–15.0)
MCH: 25.5 pg — ABNORMAL LOW (ref 26.0–34.0)
MCHC: 31 g/dL (ref 30.0–36.0)
MCV: 82.3 fL (ref 80.0–100.0)
Platelets: 430 10*3/uL — ABNORMAL HIGH (ref 150–400)
RBC: 3.45 MIL/uL — ABNORMAL LOW (ref 3.87–5.11)
RDW: 14 % (ref 11.5–15.5)
WBC: 13 10*3/uL — ABNORMAL HIGH (ref 4.0–10.5)
nRBC: 0 % (ref 0.0–0.2)

## 2018-12-07 LAB — CULTURE, BLOOD (ROUTINE X 2): Special Requests: ADEQUATE

## 2018-12-07 MED ORDER — INSULIN GLARGINE 100 UNIT/ML ~~LOC~~ SOLN
8.0000 [IU] | Freq: Every day | SUBCUTANEOUS | Status: DC
  Administered 2018-12-07 – 2018-12-12 (×6): 8 [IU] via SUBCUTANEOUS
  Filled 2018-12-07 (×6): qty 0.08

## 2018-12-07 NOTE — Plan of Care (Signed)
  Problem: Education: Goal: Knowledge of General Education information will improve Description Including pain rating scale, medication(s)/side effects and non-pharmacologic comfort measures Outcome: Progressing   

## 2018-12-07 NOTE — Plan of Care (Signed)
  Problem: Education: Goal: Knowledge of General Education information will improve Description: Including pain rating scale, medication(s)/side effects and non-pharmacologic comfort measures Outcome: Progressing   Problem: Activity: Goal: Risk for activity intolerance will decrease Outcome: Progressing   Problem: Nutrition: Goal: Adequate nutrition will be maintained Outcome: Progressing   Problem: Coping: Goal: Level of anxiety will decrease Outcome: Progressing   

## 2018-12-07 NOTE — Progress Notes (Signed)
Rehab Admissions Coordinator Note:  Patient was screened by Cleatrice Burke for appropriateness for an Inpatient Acute Rehab Consult per PT and OT recs..  At this time, we are recommending Inpatient Rehab consult if pt would like to be considered for admit. Our LOS will not be 4 weeks of her NWB status. If she needs placement for the entire time of her NWB status, then would recommend SNF , not CIR .   Cleatrice Burke RN MSN 12/07/2018, 11:12 AM  I can be reached at [phone].

## 2018-12-07 NOTE — Progress Notes (Signed)
Spoke to [place], Therapist, sports concerning PICC placement. Plan is to discharge on Monday. Considering this and patient has sufficient IV access for now, plan will to place in the am.

## 2018-12-07 NOTE — Progress Notes (Signed)
   HPI:  Status post partial fourth and fifth ray left foot with bone biopsy of the talus and navicular. DOS: 12/06/2018.  Patient doing well.  No pain.  Patient states that she rested comfortably last night.  CBC Latest Ref Rng & Units 12/07/2018 12/06/2018 12/05/2018  WBC 4.0 - 10.5 K/uL 13.0(H) 9.8 16.0(H)  Hemoglobin 12.0 - 15.0 g/dL 8.8(L) 9.5(L) 9.6(L)  Hematocrit 36.0 - 46.0 % 28.4(L) 29.9(L) 30.1(L)  Platelets 150 - 400 K/uL 430(H) 400 410(H)     Past Medical History:  Diagnosis Date  . Anemia   . Arthritis   . Asthma   . Carpal tunnel syndrome on right   . Colon polyps    adenomatous  . Diabetes mellitus without complication ([place])   . Diabetic infection of left foot ([place]) 12/2018  . Diverticulosis of colon   . Esophagitis   . GERD (gastroesophageal reflux disease)   . Hemorrhoid    internal  . Hyperlipemia   . Hypertension   . Myalgia due to statin 11/19/2017  . Neuropathy   . Pneumonia   . PONV (postoperative nausea and vomiting)   . Skin ulcer of right ankle, limited to breakdown of skin ([place]) 12/31/2016  . Sleep apnea    has had in the past lost 50 pounds and do longer uses cpap  . Statin intolerance 04/23/2015  . Stroke-like episode ([place]) 2009   TIA  . 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 ([place]) 07/13/2015  . Uncontrolled type 2 diabetes mellitus with hyperglycemia ([place]) 11/01/2017      Objective/Physical Exam Vascular status intact.  Capillary refill within normal limits..  Skin incisions appear to be well coapted with sutures and staples intact. No active bleeding noted. Moderate edema noted to the surgical extremity.  Assessment: 1. s/p fourth and fifth ray amputations left foot with irrigation and debridement. DOS: 12/06/2018 2.  Cellulitis left foot 3.  Osteomyelitis left foot   Plan of Care:  -Dressings were changed today.  The incision looks very good and well coapted. -Bone biopsies and intraoperative cultures pending.   -Continue  antibiotic regimen as per ID -Keep dressings clean dry and intact.  Will likely be inpatient through the weekend.  I will come for follow-up and dressing change on 12/09/2018 evening. -Patient discussed being discharged to a rehab facility.  I agree and believe this will tremendously help her.  Patient lives alone.  Patient needs to be strictly nonweightbearing to the surgical extremity x4 weeks.  Patient likely unable to perform activities of daily living alone in her home being nonweightbearing to her foot.  Edrick Kins, DPM [facility]  Dr. Edrick Kins, [address]                                        [place], [place] [address]                Office [phone]  Fax [phone]

## 2018-12-07 NOTE — Progress Notes (Signed)
Family Medicine Teaching Service Daily Progress Note Intern Pager: [phone]  Patient name: Olivia Romero Medical record number: [id number] Date of birth: 04-03-1956 Age: 63 y.o. Gender: female  Primary Care Provider: Arnetha Courser, MD Consultants: ID, podiatry Code Status: Full  Pt Overview and Major Events to Date:  7/1-admission for sepsis secondary to osteomyelitis, bacteremia 7/3-partial foot amputation with irrigation and debridement of left foot with bone biopsy  Antibiotics: Vancomycin (7/1- ) Zosyn (7/1-7/2)  Assessment and Plan: Olivia Romero is a 63 y.o. female presenting with sepsis. PMH is significant for poorly controlled T2DM, charcot foot, significant peripheral neuropathy, HTN, GERD, asthma  Bacteremia and osteomyelitis left foot-improving s/p amputation Left foot partial amputation with debridement and biopsy performed on 7/3 without complication.  Echo overnight showed no evidence of vegetations (normal EF with mild mitral valve thickening and calcifications) vitals overnight have been unremarkable, patient is afebrile.  Infectious disease is recommending treatment with vancomycin at present and following up with bone biopsies.  Once bacteremia is cleared, she will likely require placement of a PICC line for long-term antibiotic therapy.  Most recent blood cultures (drawn 7/2) show no growth to date at 12 hours. -Podiatry following, appreciate recommendations -Infectious disease following, appreciate recommendations -Consider PICC placement once blood cultures are negative for 48 hours -S/p Zosyn (7/1-7/2) -Vancomycin (7/1- ) -Follow-up blood cultures -Follow-up biopsy culture and sensitivities -Tylenol PRN  Anemia Baseline hemoglobin around 11.  Admission hemoglobin 11.2.  Hemoglobin dropped to 9.6 on hospital day 2 and then dropped to 8.8 following left metatarsal amputation.  This is likely related to acute blood loss in the setting of surgery although  there may be a chronic component. -Follow-up iron studies -Monitor hemoglobin -Transfuse for hemoglobin under 7  Diabetes Blood glucose in the past 24 hours from 167-347.  21 units aspart given in past 24 hours.  Blood sugars elevated in the setting of steroid use administered intraoperatively due to her risk of post op nausea/vomiting. -Start Lantus 8 mg daily -SSI moderate -At bedtime correction -Monitor CBGs  Hypertension-controlled Home medication includes HCTZ.  Blood pressure well controlled overnight (7/4). -Continue HCTZ 12.5 mg daily  Charcot foot, neuropathy -Duloxetine 60 mg daily -Gabapentin 800 mg 3 times daily  Post op nausea/vomiting risk Anesthesia noted that pt has a high risk of post operative nausea and vomiting.  She was prophylactically given, midazolam, dexamethasone, zofran and scopolamine in the pre operative setting.  -Discontinue scopolamine patch  GERD Home medication includes Protonix 40 mg daily. Protonix 40 mg daily  FEN/GI: Carb modified PPx: Heparin  Disposition: 1-2 additional days of hospitalization anticipated prior to discharge  Subjective:  No acute events overnight.  She reports minimal pain from her left foot.  She reports that her pain is currently well controlled with Tylenol.  She reports she has not yet had a bowel movement though she is passing flatus.  She is eating without nausea.  Objective: Temp:  [97.3 F (36.3 C)-98.4 F (36.9 C)] 98.4 F (36.9 C) (07/03 2325) Pulse Rate:  [77-103] 78 (07/03 2325) Resp:  [11-21] 18 (07/03 2325) BP: (99-133)/(47-64) 111/55 (07/03 2325) SpO2:  [93 %-100 %] 97 % (07/03 2325) Weight:  [87 kg] 87 kg (07/03 0853) Physical Exam: General: Alert and cooperative and appears to be in no acute distress Cardio: Normal S1 and S2, no S3 or S4. Rhythm is regular. No murmurs or rubs.   Pulm: Clear to auscultation bilaterally, no crackles, wheezing, or diminished breath sounds. Normal respiratory  effort Abdomen: Bowel sounds normal. Abdomen soft and non-tender.  Extremities: No peripheral edema. Warm/ well perfused.  Strong radial pulse.   Laboratory: Recent Labs  Lab 12/04/18 1615 12/05/18 0803 12/06/18 0220  WBC 17.2* 16.0* 9.8  HGB 11.4* 9.6* 9.5*  HCT 36.1 30.1* 29.9*  PLT 468* 410* 400   Recent Labs  Lab 12/04/18 1134 12/04/18 1615 12/05/18 0803 12/06/18 0220  NA 137 132* 137  --   K 5.5* 4.0 3.9  --   CL 97 98 103  --   CO2 23 21* 24  --   BUN 21 23 14   --   CREATININE 0.67 0.91 0.78 0.94  CALCIUM 9.6 9.1 8.8*  --   PROT  --  7.8  --   --   BILITOT  --  0.3  --   --   ALKPHOS  --  68  --   --   ALT  --  19  --   --   AST  --  20  --   --   GLUCOSE 108* 266* 150*  --     Imaging/Diagnostic Tests: Dg Foot Complete Left  Result Date: 12/06/2018 CLINICAL DATA:  Status post left fourth and fifth ray amputation in the left foot. Diabetes mellitus. EXAM: LEFT FOOT - COMPLETE 3+ VIEW COMPARISON:  12/05/2018 left foot MRI FINDINGS: Status post resection of the fourth and fifth toes and fourth and fifth metatarsals. Partial resection of distal/lateral left cuboid. Diffuse soft tissue swelling. Scattered soft tissue gas in the amputation bed adjacent to the distal left third metatarsal. Skin staples overlie the lateral left foot. Osteoarthritis throughout the tarsal and tarsometatarsal joints with fragmentation at the second and third tarsometatarsal joints. No acute appearing osseous erosions. Moderate plantar and Achilles left calcaneal spurs. Degenerative changes in the left ankle joint. IMPRESSION: Expected postsurgical changes status post left fourth and fifth toe, left fourth and fifth metatarsal and partial left cuboid resection. Degenerative changes in the left ankle joint, left tarsal joints and first through third tarsometatarsal joints as detailed. Electronically Signed   By: Ilona Sorrel M.D.   On: 12/06/2018 14:53      Matilde Haymaker, MD 12/07/2018, 3:38  AM PGY-2, [place] Intern pager: [phone], text pages welcome

## 2018-12-07 NOTE — Progress Notes (Signed)
Patient ID: Olivia Romero, female   DOB: Mar 15, 1956, 63 y.o.   MRN: [id number]         [facility]  Date of Admission:  12/04/2018           Day 4 vancomycin ASSESSMENT: She has a MRSA diabetic foot infection complicated by bacteremia.  Only 1 blood culture was positive upon admission.  I do not feel that we need to pursue a TEE given that she already has an indication for prolonged IV vancomycin.  Her repeat blood culture is negative at 48 hours.  I will order PICC placement.  PLAN: 1. Continue vancomycin 2. PICC placement  Diagnosis: Diabetic foot infection and bacteremia  Culture Result: MRSA  Allergies  Allergen Reactions  . Eggs Or Egg-Derived Products Swelling and Other (See Comments)    Angioedema  . Atorvastatin Other (See Comments)    Liver toxicity  . Flu Virus Vaccine Swelling    Arm swelled (site of injection)  . Latex Itching  . Pravastatin Itching and Rash  . Tape Rash and Other (See Comments)    TAPE PULLS OFF THE SKIN!!! Please use an alternative!!    OPAT Orders Discharge antibiotics: Per pharmacy protocol vancomycin Aim for Vancomycin trough 15-20 (unless otherwise indicated) Duration: 6 weeks End Date: 01/14/2019  [facility] Care Per Protocol:  Labs weekly while on IV antibiotics: _x_ CBC with differential _x_ BMP __ CMP _x_ CRP _x_ ESR _x_ Vancomycin trough __ CK  _x_ Please pull PIC at completion of IV antibiotics __ Please leave PIC in place until doctor has seen patient or been notified  Fax weekly labs to [phone]  Clinic Follow Up Appt: 01/14/2019   Principal Problem:   MRSA bacteremia Active Problems:   Osteomyelitis of ankle or foot, acute, left ([place])   Type 2 diabetes mellitus with diabetic nephropathy (HCC)   Post-operative state   Scheduled Meds: . DULoxetine  60 mg Oral QHS  . gabapentin  800 mg Oral TID  . heparin  5,000 Units Subcutaneous Q8H  . hydrochlorothiazide  12.5 mg Oral  Daily  . insulin aspart  0-15 Units Subcutaneous TID WC  . insulin aspart  0-5 Units Subcutaneous QHS  . insulin glargine  8 Units Subcutaneous Daily  . pantoprazole  40 mg Oral Daily  . sodium chloride flush  3 mL Intravenous Once  . sodium chloride flush  3 mL Intravenous Q12H   Continuous Infusions: . sodium chloride    . lactated ringers 10 mL/hr at 12/06/18 0919  . vancomycin 1,000 mg (12/06/18 1736)   PRN Meds:.sodium chloride, acetaminophen **OR** acetaminophen, albuterol, sodium chloride flush   SUBJECTIVE: She is feeling better today.  She underwent surgery yesterday.  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  No organisms were seen on operative Gram stain and operative cultures are pending.  She has grown MRSA from preop wound cultures and blood cultures.  No vegetations were seen on TTE.   Review of Systems: Review of Systems  Constitutional: Negative for fever.  Musculoskeletal: Positive for joint pain.    Allergies  Allergen Reactions  . Eggs Or Egg-Derived Products Swelling and Other (See Comments)    Angioedema  . Atorvastatin Other (See Comments)    Liver toxicity  . Flu Virus Vaccine Swelling    Arm swelled (site of injection)  . Latex Itching  . Pravastatin Itching and Rash  . Tape Rash and Other (  See Comments)    TAPE PULLS OFF THE SKIN!!! Please use an alternative!!    OBJECTIVE: Vitals:   12/06/18 2325 12/07/18 0416 12/07/18 1040 12/07/18 1207  BP: (!) 111/55 (!) 114/45 (!) 109/52 (!) 108/51  Pulse: 78 (!) 56 62 64  Resp: 18 16    Temp: 98.4 F (36.9 C) 97.7 F (36.5 C) 99.1 F (37.3 C) 98.5 F (36.9 C)  TempSrc: Oral Oral Oral Oral  SpO2: 97% 100% 99% 100%  Weight:      Height:       Body mass index is 37.46 kg/m.  Physical Exam Constitutional:      Comments: She is talkative and in good spirits.  She is sitting up in a chair.  Musculoskeletal:     Comments: She has an Ace wrap  dressing on her left foot.  Psychiatric:        Mood and Affect: Mood normal.     Lab Results Lab Results  Component Value Date   WBC 13.0 (H) 12/07/2018   HGB 8.8 (L) 12/07/2018   HCT 28.4 (L) 12/07/2018   MCV 82.3 12/07/2018   PLT 430 (H) 12/07/2018    Lab Results  Component Value Date   CREATININE 0.85 12/07/2018   BUN 18 12/07/2018   NA 136 12/07/2018   K 4.8 12/07/2018   CL 101 12/07/2018   CO2 27 12/07/2018    Lab Results  Component Value Date   ALT 19 12/04/2018   AST 20 12/04/2018   ALKPHOS 68 12/04/2018   BILITOT 0.3 12/04/2018     Microbiology: Recent Results (from the past 240 hour(s))  Novel Coronavirus,NAA,(SEND-OUT TO REF LAB - TAT 24-48 hrs); Hosp Order     Status: None   Collection Time: 11/27/18 11:34 PM   Specimen: Nasopharyngeal Swab; Respiratory  Result Value Ref Range Status   SARS-CoV-2, NAA NOT DETECTED NOT DETECTED Final    Comment: (NOTE) This test was developed and its performance characteristics determined by [employer]. This test has not been FDA cleared or approved. This test has been authorized by FDA under an Emergency Use Authorization (EUA). This test is only authorized for the duration of time the declaration that circumstances exist justifying the authorization of the emergency use of in vitro diagnostic tests for detection of SARS-CoV-2 virus and/or diagnosis of COVID-19 infection under section 564(b)(1) of the Act, 21 U.S.C. 629BMW-4(X)(3), unless the authorization is terminated or revoked sooner. When diagnostic testing is negative, the possibility of a false negative result should be considered in the context of a patient's recent exposures and the presence of clinical signs and symptoms consistent with COVID-19. An individual without symptoms of COVID-19 and who is not shedding SARS-CoV-2 virus would expect to have a negative (not detected) result in this assay. Performed  At: [facility] [place], [place] [id number] Rush Farmer MD ZD:[id number]    [place]  Final    Comment: Performed at [facility], [address] [place], [place] [address]  WOUND CULTURE     Status: Abnormal   Collection Time: 12/03/18  4:36 PM   Specimen: Foot, Left; Wound  Result Value Ref Range Status   MICRO NUMBER: [id number]  Final   SPECIMEN QUALITY: Adequate  Final   SOURCE: FOOT, LEFT  Final   STATUS: FINAL  Final   GRAM STAIN:   Final    No white blood cells seen Few epithelial cells Rare Gram positive cocci in pairs Rare  Gram positive bacilli   ISOLATE 1: methicillin resistant Staphylococcus aureus (A)  Final    Comment: Heavy growth of Methicillin resistant Staphylococcus aureus (MRSA) Negative for inducible clindamycin resistance.      Susceptibility   Methicillin resistant staphylococcus aureus - AEROBIC CULT, GRAM STAIN POSITIVE 1    VANCOMYCIN 1 Sensitive     CIPROFLOXACIN >=8 Resistant     CLINDAMYCIN <=0.25 Sensitive     LEVOFLOXACIN >=8 Resistant     ERYTHROMYCIN >=8 Resistant     GENTAMICIN <=0.5 Sensitive     OXACILLIN* NR Resistant      * Oxacillin-resistant staphylococci are resistant toall currently available beta-lactam antimicrobialagents including penicillins, beta lactam/beta-lactamase inhibitor combinations, and cephems withstaphylococcal indications, including Cefazolin.    TETRACYCLINE <=1 Sensitive     TRIMETH/SULFA* >=320 Resistant      * Oxacillin-resistant staphylococci are resistant toall currently available beta-lactam antimicrobialagents including penicillins, beta lactam/beta-lactamase inhibitor combinations, and cephems withstaphylococcal indications, including Cefazolin.Legend:S = Susceptible  I = IntermediateR = Resistant  NS = Not susceptible* = Not tested  NR = Not reported**NN = See antimicrobic comments  Blood culture (routine x 2)     Status: None (Preliminary result)   Collection Time: 12/04/18  3:55 PM    Specimen: BLOOD  Result Value Ref Range Status   Specimen Description BLOOD RIGHT ANTECUBITAL  Final   Special Requests   Final    BOTTLES DRAWN AEROBIC AND ANAEROBIC Blood Culture results may not be optimal due to an inadequate volume of blood received in culture bottles   Culture   Final    NO GROWTH 2 DAYS Performed at [facility] [address] [place], [place] [address]    Report Status PENDING  Incomplete  Urine culture     Status: None   Collection Time: 12/04/18  5:12 PM   Specimen: Urine, Random  Result Value Ref Range Status   Specimen Description URINE, RANDOM  Final   Special Requests NONE  Final   Culture   Final    NO GROWTH Performed at [facility], [place] [address], [place], [place] [address]    Report Status 12/05/2018 FINAL  Final  SARS Coronavirus 2 (CEPHEID- Performed in [place] hospital lab), Hosp Order     Status: None   Collection Time: 12/04/18  5:28 PM   Specimen: Nasopharyngeal Swab  Result Value Ref Range Status   SARS Coronavirus 2 NEGATIVE NEGATIVE Final    Comment: (NOTE) If result is NEGATIVE SARS-CoV-2 target nucleic acids are NOT DETECTED. The SARS-CoV-2 RNA is generally detectable in upper and lower  respiratory specimens during the acute phase of infection. The lowest  concentration of SARS-CoV-2 viral copies this assay can detect is 250  copies / mL. A negative result does not preclude SARS-CoV-2 infection  and should not be used as the sole basis for treatment or other  patient management decisions.  A negative result may occur with  improper specimen collection / handling, submission of specimen other  than nasopharyngeal swab, presence of viral mutation(s) within the  areas targeted by this assay, and inadequate number of viral copies  (<250 copies / mL). A negative result must be combined with clinical  observations, patient history, and epidemiological information. If result is POSITIVE SARS-CoV-2 target nucleic acids  are DETECTED. The SARS-CoV-2 RNA is generally detectable in upper and lower  respiratory specimens dur ing the acute phase of infection.  Positive  results are indicative of active infection with  SARS-CoV-2.  Clinical  correlation with patient history and other diagnostic information is  necessary to determine patient infection status.  Positive results do  not rule out bacterial infection or co-infection with other viruses. If result is PRESUMPTIVE POSTIVE SARS-CoV-2 nucleic acids MAY BE PRESENT.   A presumptive positive result was obtained on the submitted specimen  and confirmed on repeat testing.  While 2019 novel coronavirus  (SARS-CoV-2) nucleic acids may be present in the submitted sample  additional confirmatory testing may be necessary for epidemiological  and / or clinical management purposes  to differentiate between  SARS-CoV-2 and other Sarbecovirus currently known to infect humans.  If clinically indicated additional testing with an alternate test  methodology [phone]) is advised. The SARS-CoV-2 RNA is generally  detectable in upper and lower respiratory sp ecimens during the acute  phase of infection. The expected result is Negative. Fact Sheet for Patients:  StrictlyIdeas.no Fact Sheet for Healthcare Providers: BankingDealers.co.za This test is not yet approved or cleared by the [place] FDA and has been authorized for detection and/or diagnosis of SARS-CoV-2 by FDA under an Emergency Use Authorization (EUA).  This EUA will remain in effect (meaning this test can be used) for the duration of the COVID-19 declaration under Section 564(b)(1) of the Act, 21 U.S.C. section 360bbb-3(b)(1), unless the authorization is terminated or revoked sooner. Performed at [facility], [place] [address], [place], [place] [address]   Blood culture (routine x 2)     Status: Abnormal   Collection Time: 12/04/18  5:37 PM    Specimen: BLOOD  Result Value Ref Range Status   Specimen Description BLOOD LEFT ANTECUBITAL  Final   Special Requests   Final    BOTTLES DRAWN AEROBIC AND ANAEROBIC Blood Culture adequate volume   Culture  Setup Time   Final    GRAM POSITIVE COCCI IN CLUSTERS IN BOTH AEROBIC AND ANAEROBIC BOTTLES CRITICAL RESULT CALLED TO, READ BACK BY AND VERIFIED WITH: [place] [id number] AT 84 BY CM Performed at [facility], [place] [address], [place], [place] [address]    Culture METHICILLIN RESISTANT STAPHYLOCOCCUS AUREUS (A)  Final   Report Status 12/07/2018 FINAL  Final   Organism ID, Bacteria METHICILLIN RESISTANT STAPHYLOCOCCUS AUREUS  Final      Susceptibility   Methicillin resistant staphylococcus aureus - MIC*    CIPROFLOXACIN >=8 RESISTANT Resistant     ERYTHROMYCIN >=8 RESISTANT Resistant     GENTAMICIN <=0.5 SENSITIVE Sensitive     OXACILLIN >=4 RESISTANT Resistant     TETRACYCLINE <=1 SENSITIVE Sensitive     VANCOMYCIN 1 SENSITIVE Sensitive     TRIMETH/SULFA >=320 RESISTANT Resistant     CLINDAMYCIN <=0.25 SENSITIVE Sensitive     RIFAMPIN <=0.5 SENSITIVE Sensitive     Inducible Clindamycin NEGATIVE Sensitive     * METHICILLIN RESISTANT STAPHYLOCOCCUS AUREUS  Blood Culture ID Panel (Reflexed)     Status: Abnormal   Collection Time: 12/04/18  5:37 PM  Result Value Ref Range Status   Enterococcus species NOT DETECTED NOT DETECTED Final   Listeria monocytogenes NOT DETECTED NOT DETECTED Final   Staphylococcus species DETECTED (A) NOT DETECTED Final    Comment: CRITICAL RESULT CALLED TO, READ BACK BY AND VERIFIED WITH: PHARMD E SINCLAIRE [id number] AT 1310 BY CM    Staphylococcus aureus (BCID) DETECTED (A) NOT DETECTED Final    Comment: Methicillin (oxacillin)-resistant Staphylococcus aureus (MRSA). MRSA is predictably resistant to beta-lactam antibiotics (except ceftaroline). Preferred therapy is vancomycin unless  clinically contraindicated. Patient requires contact  precautions if  hospitalized. CRITICAL RESULT CALLED TO, READ BACK BY AND VERIFIED WITH: PHARMD E [place] [id number] AT 1310 BY CM    Methicillin resistance DETECTED (A) NOT DETECTED Final    Comment: CRITICAL RESULT CALLED TO, READ BACK BY AND VERIFIED WITH: PHARMD E [place] [id number] AT 1310 BY CM    Streptococcus species NOT DETECTED NOT DETECTED Final   Streptococcus agalactiae NOT DETECTED NOT DETECTED Final   Streptococcus pneumoniae NOT DETECTED NOT DETECTED Final   Streptococcus pyogenes NOT DETECTED NOT DETECTED Final   Acinetobacter baumannii NOT DETECTED NOT DETECTED Final   Enterobacteriaceae species NOT DETECTED NOT DETECTED Final   Enterobacter cloacae complex NOT DETECTED NOT DETECTED Final   Escherichia coli NOT DETECTED NOT DETECTED Final   Klebsiella oxytoca NOT DETECTED NOT DETECTED Final   Klebsiella pneumoniae NOT DETECTED NOT DETECTED Final   Proteus species NOT DETECTED NOT DETECTED Final   Serratia marcescens NOT DETECTED NOT DETECTED Final   Haemophilus influenzae NOT DETECTED NOT DETECTED Final   Neisseria meningitidis NOT DETECTED NOT DETECTED Final   Pseudomonas aeruginosa NOT DETECTED NOT DETECTED Final   Candida albicans NOT DETECTED NOT DETECTED Final   Candida glabrata NOT DETECTED NOT DETECTED Final   Candida krusei NOT DETECTED NOT DETECTED Final   Candida parapsilosis NOT DETECTED NOT DETECTED Final   Candida tropicalis NOT DETECTED NOT DETECTED Final    Comment: Performed at [facility], [address], [place], [place] [address]  MRSA PCR Screening     Status: None   Collection Time: 12/05/18  5:22 AM   Specimen: Nasal Mucosa; Nasopharyngeal  Result Value Ref Range Status   MRSA by PCR NEGATIVE NEGATIVE Final    Comment:        The GeneXpert MRSA Assay (FDA approved for NASAL specimens only), is one component of a comprehensive MRSA colonization surveillance program. It is not intended to diagnose MRSA infection nor to guide or  monitor treatment for MRSA infections. Performed at [facility], [place] [address] [place], [place] [address]   Blood culture (routine x 2)     Status: None (Preliminary result)   Collection Time: 12/05/18  7:32 PM   Specimen: BLOOD  Result Value Ref Range Status   Specimen Description BLOOD LEFT ANTECUBITAL  Final   Special Requests   Final    BOTTLES DRAWN AEROBIC ONLY Blood Culture adequate volume   Culture   Final    NO GROWTH < 12 HOURS Performed at [facility], [place] [address], [place], [place] [address]    Report Status PENDING  Incomplete  Aerobic/Anaerobic Culture (surgical/deep wound)     Status: None (Preliminary result)   Collection Time: 12/06/18 10:41 AM   Specimen: Foot, Left; Abscess  Result Value Ref Range Status   Specimen Description FOOT  Final   Special Requests LEFT SWAB  Final   Gram Stain   Final    RARE WBC PRESENT, PREDOMINANTLY PMN NO ORGANISMS SEEN    Culture   Final    CULTURE REINCUBATED FOR BETTER GROWTH Performed at [facility], [address] [place], [place] [address]    Report Status PENDING  Incomplete    Michel Bickers, MD [facility] [phone] pager   [phone] cell 12/07/2018, 2:13 PM

## 2018-12-07 NOTE — Progress Notes (Signed)
Occupational Therapy Evaluation Patient Details Name: Olivia Romero MRN: [id number] DOB: 05/30/1956 Today's Date: 12/07/2018    History of Present Illness Mrs. Mcintire is a 63 y.o. female presenting with sepsis diabetic L foot ulcer and r/u osteomyelitis. PMH is significant for poorly controlled T2DM, charcot foot, significant peripheral neuropathy, HTN, GERD, asthma.    Clinical Impression   Pt presented with above diagnosis. PTA pt PLOF requiring Mod I with use of AE and increased time with son and daughter in law available as needed. Pt currentty requires assist in functional mobility with toilet transfer due to instability and weakness. Pt will benefit from continued acute OT to address safety in functional transfers, strength, and sequencing prior to d/c to CIR or SNF to ensure safe transition to home environment.     Follow Up Recommendations  CIR;SNF;Supervision/Assistance - 24 hour    Equipment Recommendations  3 in 1 bedside commode    Recommendations for Other Services Rehab consult     Precautions / Restrictions Restrictions Weight Bearing Restrictions: No(no WB status at this moment. )      Mobility Bed Mobility               General bed mobility comments: Pt received sitting at EOB upon arrival.   Transfers Overall transfer level: Needs assistance Equipment used: Rolling walker (2 wheeled) Transfers: Stand Pivot Transfers   Stand pivot transfers: Mod assist;+2 safety/equipment;+2 physical assistance       General transfer comment: for safety, sequencing, and hand placement.     Balance Overall balance assessment: Needs assistance Sitting-balance support: Bilateral upper extremity supported;Feet supported Sitting balance-Leahy Scale: Fair     Standing balance support: Bilateral upper extremity supported Standing balance-Leahy Scale: Poor Standing balance comment: reliant of RW                           ADL either performed or  assessed with clinical judgement   ADL Overall ADL's : Needs assistance/impaired Eating/Feeding: Set up;Sitting   Grooming: Wash/dry hands;Wash/dry face;Oral care;Set up;Sitting   Upper Body Bathing: Independent;Sitting   Lower Body Bathing: Supervison/ safety;Sitting/lateral leans   Upper Body Dressing : Independent;Sitting   Lower Body Dressing: Supervision/safety;Sitting/lateral leans   Toilet Transfer: Moderate assistance;+2 for safety/equipment;Stand-pivot;Cueing for sequencing;Cueing for safety;RW;BSC Toilet Transfer Details (indicate cue type and reason): VCs required for safety awareness and safe use of RW during stand to sit transfers.         Functional mobility during ADLs: Moderate assistance;+2 for safety/equipment;Rolling walker;Cueing for safety;Cueing for sequencing General ADL Comments: Pt demonstrates decreased instability to stand with RW for toilet hygiene requiring reminders if assistance is needed. However, pt determined to attempt.       Vision Baseline Vision/History: Wears glasses       Perception     Praxis      Pertinent Vitals/Pain Pain Assessment: Faces Faces Pain Scale: Hurts a little bit Pain Location: R foot// back Pain Descriptors / Indicators: Dull;Discomfort Pain Intervention(s): Monitored during session;Repositioned     Hand Dominance Right   Extremity/Trunk Assessment Upper Extremity Assessment Upper Extremity Assessment: Generalized weakness   Lower Extremity Assessment Lower Extremity Assessment: Defer to PT evaluation       Communication Communication Communication: No difficulties   Cognition Arousal/Alertness: Awake/alert Behavior During Therapy: WFL for tasks assessed/performed Overall Cognitive Status: Within Functional Limits for tasks assessed  General Comments: decreased safety awareness when completing ADLs.. SLightly impulsive   General Comments  Pt reports major  concerns of returning home due to safety and decreased assistance/support    Exercises     Shoulder Instructions      Home Living Family/patient expects to be discharged to:: Private residence Living Arrangements: Children(son, daughter in law) Available Help at Discharge: Family;Available PRN/intermittently Type of Home: House Home Access: Stairs to enter [employer] of Steps: 2 Entrance Stairs-Rails: None Home Layout: One level     Bathroom Shower/Tub: Teacher, early years/pre: Standard     Home Equipment: Environmental consultant - 4 wheels;Adaptive equipment;Crutches;Walker - 2 wheels;Wheelchair - manual   Additional Comments: Son works during the day and daughter will currently receive back surgery decreasing her assistance in home setting.       Prior Functioning/Environment Level of Independence: Independent with assistive device(s)        Comments: used crutches for balance        OT Problem List: Decreased activity tolerance;Impaired balance (sitting and/or standing);Decreased safety awareness;Decreased knowledge of use of DME or AE;Pain      OT Treatment/Interventions: Self-care/ADL training;Therapeutic exercise;DME and/or AE instruction;Therapeutic activities;Patient/family education;Balance training    OT Goals(Current goals can be found in the care plan section) Acute Rehab OT Goals Patient Stated Goal: wants to be safe OT Goal Formulation: With patient Time For Goal Achievement: 12/21/18 Potential to Achieve Goals: Fair  OT Frequency: Min 2X/week   Barriers to D/C: Inaccessible home environment;Decreased caregiver support          Co-evaluation PT/OT/SLP Co-Evaluation/Treatment: Yes Reason for Co-Treatment: For patient/therapist safety   OT goals addressed during session: ADL's and self-care      AM-PAC OT "6 Clicks" Daily Activity     Outcome Measure Help from another person eating meals?: A Little Help from another person taking care  of personal grooming?: A Little Help from another person toileting, which includes using toliet, bedpan, or urinal?: A Lot Help from another person bathing (including washing, rinsing, drying)?: A Little Help from another person to put on and taking off regular upper body clothing?: None Help from another person to put on and taking off regular lower body clothing?: A Little 6 Click Score: 18   End of Session Equipment Utilized During Treatment: Gait belt;Rolling walker Nurse Communication: Mobility status  Activity Tolerance: Patient tolerated treatment well Patient left: in chair;with call bell/phone within reach  OT Visit Diagnosis: Unsteadiness on feet (R26.81);Pain;History of falling (Z91.81) Pain - Right/Left: Left Pain - part of body: Ankle and joints of foot                Time: 1610-9604 OT Time Calculation (min): 26 min Charges:  OT General Charges $OT Visit: 1 Visit OT Evaluation $OT Eval Low Complexity: [place], MSOT, OTR/L  Supplemental Rehabilitation Services  [phone]   Marius Ditch 12/07/2018, 10:11 AM

## 2018-12-07 NOTE — Evaluation (Addendum)
Physical Therapy Evaluation Patient Details Name: Olivia Romero MRN: [id number] DOB: [birth year] Today's Date: 12/07/2018   History of Present Illness  Mrs. Mcmichen is a 63 y.o. female presenting with sepsis diabetic L foot ulcer and r/u osteomyelitis. PMH is significant for poorly controlled T2DM, charcot foot, significant peripheral neuropathy, HTN, GERD, asthma. S/p partial left foot amputation and irrigation and debridement 7/3.   Clinical Impression  Pt admitted with above. Prior to admission, independent with ADL's and mobility using a cane, reports history of falls. She lives with her son and daughter in [place]. On PT evaluation, pt presenting with decreased functional mobility secondary to weakness and balance impairments. Requiring two person minimal assist for stand pivot using walker, unable to hop. Would benefit from further transfer, gait training and strengthening prior to d/c home. Recommending CIR to maximize functional independence and decrease caregiver burden.     Follow Up Recommendations CIR;Supervision for mobility/OOB    Equipment Recommendations  3in1 (PT)    Recommendations for Other Services Rehab consult     Precautions / Restrictions Precautions Precautions: Fall Restrictions Weight Bearing Restrictions: Yes LLE Weight Bearing: Non weight bearing Other Position/Activity Restrictions: assuming NWB, no orders in chart      Mobility  Bed Mobility               General bed mobility comments: Pt received sitting at EOB upon arrival.   Transfers Overall transfer level: Needs assistance Equipment used: Rolling walker (2 wheeled) Transfers: Sit to/from [facility] Sit to Stand: Min assist;+2 safety/equipment Stand pivot transfers: +2 physical assistance;Min assist       General transfer comment: for safety, sequencing, and hand placement. pt unable to hop, pivoting on right foot with increased trunk flexion   Ambulation/Gait                 Stairs            Wheelchair Mobility    Modified Rankin (Stroke Patients Only)       Balance Overall balance assessment: Needs assistance Sitting-balance support: Bilateral upper extremity supported;Feet supported Sitting balance-Leahy Scale: Fair     Standing balance support: Bilateral upper extremity supported Standing balance-Leahy Scale: Poor Standing balance comment: reliant of RW                             Pertinent Vitals/Pain Pain Assessment: Faces Faces Pain Scale: Hurts a little bit Pain Location: R foot// back Pain Descriptors / Indicators: Dull;Discomfort Pain Intervention(s): Monitored during session    Home Living Family/patient expects to be discharged to:: Private residence Living Arrangements: Children(son, daughter in [place]) Available Help at Discharge: Family;Available PRN/intermittently Type of Home: House Home Access: Stairs to enter Entrance Stairs-Rails: None Entrance Stairs-Number of Steps: 2 Home Layout: One level Home Equipment: Walker - 4 wheels;Adaptive equipment;Crutches;Walker - 2 wheels;Wheelchair - manual Additional Comments: Son works during the day and daughter will currently receive back surgery decreasing her assistance in home setting.     Prior Function Level of Independence: Independent with assistive device(s)         Comments: used cane      Hand Dominance   Dominant Hand: Right    Extremity/Trunk Assessment   Upper Extremity Assessment Upper Extremity Assessment: Generalized weakness    Lower Extremity Assessment Lower Extremity Assessment: LLE deficits/detail LLE Deficits / Details: s/p lateral foot resection. Proximally at least anti gravity strength, able to wiggle toes  Communication   Communication: No difficulties  Cognition Arousal/Alertness: Awake/alert Behavior During Therapy: Impulsive Overall Cognitive Status: Within Functional Limits for tasks assessed                                  General Comments: decreased safety awareness when completing ADLs.. SLightly impulsive      General Comments General comments (skin integrity, edema, etc.): Pt reports major concerns of returning home due to safety and decreased assistance/support    Exercises     Assessment/Plan    PT Assessment Patient needs continued PT services  PT Problem List Decreased strength;Decreased mobility;Decreased balance;Decreased safety awareness;Pain       PT Treatment Interventions DME instruction;Gait training;Stair training;Functional mobility training;Therapeutic activities;Therapeutic exercise;Balance training;Patient/family education    PT Goals (Current goals can be found in the Care Plan section)  Acute Rehab PT Goals Patient Stated Goal: wants to be safe PT Goal Formulation: With patient Time For Goal Achievement: 12/21/18 Potential to Achieve Goals: Good    Frequency Min 3X/week   Barriers to discharge        Co-evaluation PT/OT/SLP Co-Evaluation/Treatment: Yes Reason for Co-Treatment: For patient/therapist safety;To address functional/ADL transfers PT goals addressed during session: Mobility/safety with mobility OT goals addressed during session: ADL's and self-care       AM-PAC PT "6 Clicks" Mobility  Outcome Measure Help needed turning from your back to your side while in a flat bed without using bedrails?: None Help needed moving from lying on your back to sitting on the side of a flat bed without using bedrails?: A Little Help needed moving to and from a bed to a chair (including a wheelchair)?: A Little Help needed standing up from a chair using your arms (e.g., wheelchair or bedside chair)?: A Little Help needed to walk in hospital room?: A Lot Help needed climbing 3-5 steps with a railing? : Total 6 Click Score: 16    End of Session Equipment Utilized During Treatment: Gait belt Activity Tolerance: Patient tolerated  treatment well Patient left: in chair;with call bell/phone within reach Nurse Communication: Mobility status PT Visit Diagnosis: Difficulty in walking, not elsewhere classified (R26.2);Unsteadiness on feet (R26.81)    Time: 4599-7741 PT Time Calculation (min) (ACUTE ONLY): 28 min   Charges:   PT Evaluation $PT Eval Moderate Complexity: 1 Mod          Ellamae Sia, [place], DPT Acute Rehabilitation Services Pager [phone] Office [phone]   Willy Eddy 12/07/2018, 10:25 AM

## 2018-12-08 LAB — BASIC METABOLIC PANEL
Anion gap: 9 (ref 5–15)
BUN: 22 mg/dL (ref 8–23)
CO2: 25 mmol/L (ref 22–32)
Calcium: 9.4 mg/dL (ref 8.9–10.3)
Chloride: 103 mmol/L (ref 98–111)
Creatinine, Ser: 0.76 mg/dL (ref 0.44–1.00)
GFR calc Af Amer: >60 mL/min (ref >60)
GFR calc non Af Amer: >60 mL/min (ref >60)
Glucose, Bld: 170 mg/dL — ABNORMAL HIGH (ref 70–99)
Potassium: 4.3 mmol/L (ref 3.5–5.1)
Sodium: 137 mmol/L (ref 135–145)

## 2018-12-08 LAB — CBC
HCT: 28.8 % — ABNORMAL LOW (ref 36.0–46.0)
Hemoglobin: 8.9 g/dL — ABNORMAL LOW (ref 12.0–15.0)
MCH: 26.1 pg (ref 26.0–34.0)
MCHC: 30.9 g/dL (ref 30.0–36.0)
MCV: 84.5 fL (ref 80.0–100.0)
Platelets: 443 10*3/uL — ABNORMAL HIGH (ref 150–400)
RBC: 3.41 MIL/uL — ABNORMAL LOW (ref 3.87–5.11)
RDW: 14.2 % (ref 11.5–15.5)
WBC: 12.8 10*3/uL — ABNORMAL HIGH (ref 4.0–10.5)
nRBC: 0 % (ref 0.0–0.2)

## 2018-12-08 LAB — GLUCOSE, CAPILLARY
Glucose-Capillary: 131 mg/dL — ABNORMAL HIGH (ref 70–99)
Glucose-Capillary: 172 mg/dL — ABNORMAL HIGH (ref 70–99)
Glucose-Capillary: 249 mg/dL — ABNORMAL HIGH (ref 70–99)
Glucose-Capillary: 210 mg/dL — ABNORMAL HIGH (ref 70–99)

## 2018-12-08 LAB — VANCOMYCIN, TROUGH: Vancomycin Tr: 8 ug/mL — ABNORMAL LOW (ref 15–20)

## 2018-12-08 LAB — VANCOMYCIN, PEAK: Vancomycin Pk: 18 ug/mL — ABNORMAL LOW (ref 30–40)

## 2018-12-08 MED ORDER — SODIUM CHLORIDE 0.9% FLUSH: 10.0000 mL | INTRAVENOUS | Status: DC | PRN

## 2018-12-08 MED ORDER — SODIUM CHLORIDE 0.9% FLUSH
10.0000 mL | Freq: Two times a day (BID) | INTRAVENOUS | Status: DC
  Administered 2018-12-09 – 2018-12-12 (×6): 10 mL

## 2018-12-08 MED ORDER — FERROUS SULFATE 325 (65 FE) MG PO TABS: 325.0000 mg | ORAL_TABLET | Freq: Every day | ORAL | Status: DC

## 2018-12-08 MED ORDER — OXYCODONE-ACETAMINOPHEN 7.5-325 MG PO TABS
1.0000 | ORAL_TABLET | Freq: Three times a day (TID) | ORAL | Status: DC | PRN
  Administered 2018-12-08 – 2018-12-09 (×2): 1 via ORAL
  Filled 2018-12-08 (×3): qty 1

## 2018-12-08 NOTE — Plan of Care (Signed)
  Problem: Pain Managment: Goal: General experience of comfort will improve Outcome: Progressing   

## 2018-12-08 NOTE — Progress Notes (Addendum)
Family Medicine Teaching Service Daily Progress Note Intern Pager: [phone]  Patient name: Olivia Romero Medical record number: [id number] Date of birth: [birth year] Age: 63 y.o. Gender: female  Primary Care Provider: Arnetha Courser, MD Consultants: ID, podiatry Code Status: Full  Pt Overview and Major Events to Date:  7/1-admission for sepsis secondary to osteomyelitis, bacteremia 7/3-partial foot amputation with irrigation and debridement of left foot with bone biopsy  Antibiotics: Vancomycin (7/1- ) Zosyn (7/1-7/2)  Assessment and Plan: Olivia Romero is a 63 y.o. female presenting with sepsis. PMH is significant for poorly controlled T2DM, charcot foot, significant peripheral neuropathy, HTN, GERD, asthma  Left foot osteomyelitis, status post ray amputation Status post ray amp on 7/3.  Patient had episodes of hypotension on 7/4, but most recent blood pressure check 116/65.  Echo on 7/3 unremarkable.  Podiatry following for postop wound care. Culture still pending, reincubated for better growth.  Strict nonweightbearing left lower extremity for 4 weeks.  Not a candidate for inpatient rehab until weightbearing, will likely need SNF for a few weeks and then transition to rehab afterwards.  ID also following appreciate their recommendations -Per ID continue long-term vancomycin, PICC line to be placed this a.m. -Follow-up podiatry recs, per their last note will come for wound check p.m. of 7/6 -Follow-up surgical culture, reaccumulated better growth -Tylenol PRN  MRSA bacteremia ID following.  1 out of 2 initial or MRSA.  Repeat blood cultures negative at 2 days.  Per ID recs will need PICC line placed and IV vancomycin likely long-term.  Echo without any abnormality, no indication for TEE per ID.  Anemia Hemoglobin 8.9.  Admission hemoglobin 11.2, baseline around 11.  Likely secondary to blood loss anemia in setting of surgery.  Iron studies normal aside from low iron and low  sat ratio.  Will start on ferrous sulfate 325 mg as an outpatient. Will hold while inpatient 2/2 bacteremia.  Diabetes Blood glucose in the past 24 hours from 131-239.  22 units aspart given in past 24 hours.  Elevated blood sugars likely acute phase reactant secondary to stress from surgery.  Sugars with much better control since starting Lantus.  No adjustment to medications needed at this point. -Start Lantus 8 mg daily -SSI moderate -At bedtime correction -Monitor CBGs  Hypertension-controlled Home medication includes HCTZ.  Systolic BP 324 this a.m., did have 1 episode of hypotension 7/4 but is been much better since then. -Continue HCTZ 12.5 mg daily  Charcot foot, neuropathy -Duloxetine 60 mg daily -Gabapentin 800 mg 3 times daily  GERD Home medication includes Protonix 40 mg daily. Protonix 40 mg daily   Disposition Physical therapy recommending CIR.  Unfortunately patient is nonweightbearing for 4 weeks per podiatry note.  Will need to go to SNF until able to bear weight on left foot and have consulted social work for assistance with SNF placement.  FEN/GI: Carb modified PPx: Heparin  Disposition: 1-2 additional days of hospitalization anticipated prior to discharge  Subjective:  Doing well with no issues, no complaints.  Tolerating p.o.  Objective: Temp:  [98.4 F (36.9 C)-99.1 F (37.3 C)] 98.4 F (36.9 C) (07/05 0622) Pulse Rate:  [60-71] 64 (07/05 0622) Resp:  [14] 14 (07/05 0622) BP: (78-121)/(46-68) 116/65 (07/05 0622) SpO2:  [96 %-100 %] 96 % (07/05 0622) Physical Exam: General: Pleasant 63 year old Caucasian female, no acute distress Cardio: Regular rate and rhythm, no M/R/G. Pulm: Lungs clear to auscultation bilaterally, no accessory muscle use Abdomen: Bowel sounds normal. Abdomen soft  and non-tender.  Extremities: No peripheral edema. Warm/ well perfused.  Strong radial pulse. Left foot: Dressing clean dry and intact, warm and  dry.  Laboratory: Recent Labs  Lab 12/06/18 0220 12/07/18 0353 12/08/18 0306  WBC 9.8 13.0* 12.8*  HGB 9.5* 8.8* 8.9*  HCT 29.9* 28.4* 28.8*  PLT 400 430* 443*   Recent Labs  Lab 12/04/18 1615 12/05/18 0803 12/06/18 0220 12/07/18 0409 12/08/18 0306  NA 132* 137  --  136 137  K 4.0 3.9  --  4.8 4.3  CL 98 103  --  101 103  CO2 21* 24  --  27 25  BUN 23 14  --  18 22  CREATININE 0.91 0.78 0.94 0.85 0.76  CALCIUM 9.1 8.8*  --  8.9 9.4  PROT 7.8  --   --   --   --   BILITOT 0.3  --   --   --   --   ALKPHOS 68  --   --   --   --   ALT 19  --   --   --   --   AST 20  --   --   --   --   GLUCOSE 266* 150*  --  245* 170*    Imaging/Diagnostic Tests: [place] Ekg Site Rite  Result Date: 12/07/2018 If Site Rite image not attached, placement could not be confirmed due to current cardiac rhythm.   Guadalupe Dawn, MD 12/08/2018, 8:37 AM PGY-3, [place] Intern pager: [phone], text pages welcome

## 2018-12-08 NOTE — Progress Notes (Signed)
Pharmacy Antibiotic Note  Olivia Romero is a 63 y.o. female admitted on 12/04/2018 with diabetic foot infection and MRSA bacteremia. Pharmacy has been consulted for Vancomycin dosing.    Plan: Continue Vancomycin 1g IV q24h Vancomycin levels with next dose Plan to treat through 01/14/2019 per ID recs Monitor renal function, cultures, clinical course   Height: 5' (152.4 cm) Weight: 191 lb 12.8 oz (87 kg) IBW/kg (Calculated) : 45.5  Temp (24hrs), Avg:98.4 F (36.9 C), Min:97.8 F (36.6 C), Max:98.9 F (37.2 C)  Recent Labs  Lab 12/04/18 1615 12/04/18 1738 12/05/18 0758 12/05/18 0803 12/06/18 0220 12/07/18 0353 12/07/18 0409 12/08/18 0306  WBC 17.2*  --   --  16.0* 9.8 13.0*  --  12.8*  CREATININE 0.91  --   --  0.78 0.94  --  0.85 0.76  LATICACIDVEN 2.4* 2.1* 0.8  --   --   --   --   --     Estimated Creatinine Clearance: 72.4 mL/min (by C-G formula based on SCr of 0.76 mg/dL).    Allergies  Allergen Reactions  . Eggs Or Egg-Derived Products Swelling and Other (See Comments)    Angioedema  . Atorvastatin Other (See Comments)    Liver toxicity  . Flu Virus Vaccine Swelling    Arm swelled (site of injection)  . Latex Itching  . Pravastatin Itching and Rash  . Tape Rash and Other (See Comments)    TAPE PULLS OFF THE SKIN!!! Please use an alternative!!     Thank you for allowing pharmacy to be a part of this patient's care.   Lindell Spar, PharmD, BCPS Clinical Pharmacist  12/08/2018 3:25 PM

## 2018-12-08 NOTE — Progress Notes (Signed)
Peripherally Inserted Central Catheter/Midline Placement  The IV Nurse has discussed with the patient and/or persons authorized to consent for the patient, the purpose of this procedure and the potential benefits and risks involved with this procedure.  The benefits include less needle sticks, lab draws from the catheter, and the patient may be discharged home with the catheter. Risks include, but not limited to, infection, bleeding, blood clot (thrombus formation), and puncture of an artery; nerve damage and irregular heartbeat and possibility to perform a PICC exchange if needed/ordered by physician.  Alternatives to this procedure were also discussed.  Bard Power PICC patient education guide, fact sheet on infection prevention and patient information card has been provided to patient /or left at bedside.    PICC/Midline Placement Documentation  PICC Single Lumen 09/98/33 PICC Left Basilic 37 cm 1 cm (Active)     PICC Single Lumen 82/50/53 PICC Right Basilic 40 cm 2 cm (Active)  Indication for Insertion or Continuance of Line Home intravenous therapies (PICC only) 12/08/18 1400  Exposed Catheter (cm) 2 cm 12/08/18 1400  Site Assessment Clean;Dry;Intact 12/08/18 1400  Line Status Flushed;Blood return noted;Saline locked 12/08/18 1400  Dressing Type Transparent 12/08/18 1400  Dressing Status Clean;Dry;Intact;Antimicrobial disc in place 12/08/18 1400  [place] checked and tightened 12/08/18 1400  Dressing Change Due 12/16/18 12/08/18 [place] III, Keenan Bachelor 12/08/2018, 2:51 PM

## 2018-12-08 NOTE — Progress Notes (Signed)
RN paged IV team x 2 regarding vancomycin lab trough to be drawn. RN spoke to pharmacy and lab as well concerning the matter. Nursing will continue to monitor.

## 2018-12-09 DIAGNOSIS — Z89432 Acquired absence of left foot: Secondary | ICD-10-CM

## 2018-12-09 DIAGNOSIS — Z95828 Presence of other vascular implants and grafts: Secondary | ICD-10-CM

## 2018-12-09 LAB — CBC
HCT: 29.5 % — ABNORMAL LOW (ref 36.0–46.0)
Hemoglobin: 9 g/dL — ABNORMAL LOW (ref 12.0–15.0)
MCH: 25.6 pg — ABNORMAL LOW (ref 26.0–34.0)
MCHC: 30.5 g/dL (ref 30.0–36.0)
MCV: 83.8 fL (ref 80.0–100.0)
Platelets: 458 10*3/uL — ABNORMAL HIGH (ref 150–400)
RBC: 3.52 MIL/uL — ABNORMAL LOW (ref 3.87–5.11)
RDW: 13.9 % (ref 11.5–15.5)
WBC: 11 10*3/uL — ABNORMAL HIGH (ref 4.0–10.5)
nRBC: 0.2 % (ref 0.0–0.2)

## 2018-12-09 LAB — BASIC METABOLIC PANEL
Anion gap: 10 (ref 5–15)
BUN: 21 mg/dL (ref 8–23)
CO2: 26 mmol/L (ref 22–32)
Calcium: 9.1 mg/dL (ref 8.9–10.3)
Chloride: 102 mmol/L (ref 98–111)
Creatinine, Ser: 0.67 mg/dL (ref 0.44–1.00)
GFR calc Af Amer: >60 mL/min (ref >60)
GFR calc non Af Amer: >60 mL/min (ref >60)
Glucose, Bld: 123 mg/dL — ABNORMAL HIGH (ref 70–99)
Potassium: 4 mmol/L (ref 3.5–5.1)
Sodium: 138 mmol/L (ref 135–145)

## 2018-12-09 LAB — CULTURE, BLOOD (ROUTINE X 2): Culture: NO GROWTH

## 2018-12-09 LAB — GLUCOSE, CAPILLARY
Glucose-Capillary: 203 mg/dL — ABNORMAL HIGH (ref 70–99)
Glucose-Capillary: 156 mg/dL — ABNORMAL HIGH (ref 70–99)
Glucose-Capillary: 172 mg/dL — ABNORMAL HIGH (ref 70–99)
Glucose-Capillary: 222 mg/dL — ABNORMAL HIGH (ref 70–99)

## 2018-12-09 MED ORDER — HYDROCHLOROTHIAZIDE 12.5 MG PO TABS
6.2500 mg | ORAL_TABLET | Freq: Every day | ORAL | Status: DC
  Filled 2018-12-09: qty 1

## 2018-12-09 MED ORDER — VANCOMYCIN HCL 10 G IV SOLR
1500.0000 mg | INTRAVENOUS | Status: DC
  Administered 2018-12-09 – 2018-12-12 (×4): 1500 mg via INTRAVENOUS
  Filled 2018-12-09 (×6): qty 1500

## 2018-12-09 NOTE — Progress Notes (Signed)
Pharmacy Antibiotic Note  Olivia Romero is a 63 y.o. female admitted on 12/04/2018 with diabetic foot infection and MRSA bacteremia. Pharmacy has been consulted for Vancomycin dosing.   7/6 AM update: calculated AUC of 311 is sub-therapeutic, renal function stable  Plan: Increase vancomycin to 1500 mg IV q24h >>New estimated AUC: 466 Plan to treat through 01/14/2019 per ID recs   Height: 5' (152.4 cm) Weight: 191 lb 12.8 oz (87 kg) IBW/kg (Calculated) : 45.5  Temp (24hrs), Avg:98.5 F (36.9 C), Min:97.8 F (36.6 C), Max:99.2 F (37.3 C)  Recent Labs  Lab 12/04/18 1615 12/04/18 1738 12/05/18 0758 12/05/18 0803 12/06/18 0220 12/07/18 0353 12/07/18 0409 12/08/18 0306 12/08/18 1642 12/08/18 2310  WBC 17.2*  --   --  16.0* 9.8 13.0*  --  12.8*  --   --   CREATININE 0.91  --   --  0.78 0.94  --  0.85 0.76  --   --   LATICACIDVEN 2.4* 2.1* 0.8  --   --   --   --   --   --   --   VANCOTROUGH  --   --   --   --   --   --   --   --  8*  --   VANCOPEAK  --   --   --   --   --   --   --   --   --  18*    Estimated Creatinine Clearance: 72.4 mL/min (by C-G formula based on SCr of 0.76 mg/dL).    Allergies  Allergen Reactions  . Eggs Or Egg-Derived Products Swelling and Other (See Comments)    Angioedema  . Atorvastatin Other (See Comments)    Liver toxicity  . Flu Virus Vaccine Swelling    Arm swelled (site of injection)  . Latex Itching  . Pravastatin Itching and Rash  . Tape Rash and Other (See Comments)    TAPE PULLS OFF THE SKIN!!! Please use an alternative!!    Narda Bonds, PharmD, BCPS Clinical Pharmacist Phone: [phone]

## 2018-12-09 NOTE — Progress Notes (Addendum)
Family Medicine Teaching Service Daily Progress Note Intern Pager: [phone]  Patient name: Olivia Romero Medical record number: [id number] Date of birth: 04-12-1956 Age: 63 y.o. Gender: female  Primary Care Provider: Arnetha Courser, MD Consultants: ID, podiatry Code Status: Full  Pt Overview and Major Events to Date:  7/1-admission for sepsis secondary to osteomyelitis, bacteremia 7/3-partial foot amputation with irrigation and debridement of left foot with bone biopsy  Antibiotics: Vancomycin (7/1- ) Zosyn (7/1-7/2)  Assessment and Plan: Olivia Romero is a 63 y.o. female presenting with sepsis. PMH is significant for poorly controlled T2DM, charcot foot, significant peripheral neuropathy, HTN, GERD, asthma  Left foot osteomyelitis, status post ray amputation Status post ray amp on 7/3.  Patient had episodes of hypotension on 7/4, but most recent blood pressure check 116/65.  Echo on 7/3 unremarkable.  Podiatry following for postop wound care. Culture still pending, reincubated for better growth.  Strict nonweightbearing left lower extremity for 4 weeks.  Not a candidate for inpatient rehab until weightbearing, will likely need SNF for a few weeks and then transition to rehab afterwards.  ID also following appreciate their recommendations -Per ID continue long-term vancomycin, PICC line  placed 06/07 -Follow-up podiatry recs, per their last note will come for wound check p.m. of 7/6 -Follow-up surgical culture, reaccumulated better growth -Tylenol PRN  MRSA bacteremia ID following.  1 out of 2 initial or MRSA.  Repeat blood cultures negative at 2 days.  Per ID recs  IV vancomycin likely long-term.  Echo without any abnormality, no indication for TEE per ID.  Anemia Hemoglobin 9.  Admission hemoglobin 11.2, baseline around 11.  Likely secondary to blood loss anemia in setting of surgery. Monitor Hb, and order iron studies on discharge if still anemic. Follow up anemia on an OPD  basis.  Diabetes Blood glucose in the past 24 hours from 123-245. 16 units aspart given in past 24 hours.  Elevated blood sugars likely acute phase reactant secondary to stress from surgery.  Sugars with much better control since starting Lantus.  No adjustment to medications needed at this point. -Start Lantus 8 mg daily -SSI moderate -At bedtime correction -Monitor CBGs  Hypertension-controlled Home medication includes HCTZ.  Blood pressure has been labile. BP as low at 98/52 . And maximum 135/50 over past 24 hrs. --Reduce HCTZ to 6.25mg  daily   Charcot foot, neuropathy -Duloxetine 60 mg daily -Gabapentin 800 mg 3 times daily  GERD Patient was not taking Protonix at home.  -Protonix 40 mg daily stopped  Disposition Physical therapy recommending CIR.  Unfortunately patient is nonweightbearing for 4 weeks per podiatry note.  Will need to go to SNF until able to bear weight on left foot and have consulted social work for assistance with SNF placement.  FEN/GI: Carb modified PPx: Heparin  Disposition: 1-2 additional days of hospitalization anticipated prior to discharge  Subjective:  Sat up in chair chatting. Feels well post PICC line procedure. Denies chest pain, dyspnea, nausea or vomiting. No complaints.   Objective: Temp:  [97.8 F (36.6 C)-99.2 F (37.3 C)] 98.5 F (36.9 C) (07/06 0417) Pulse Rate:  [58-79] 58 (07/06 0417) Resp:  [14-18] 14 (07/06 0417) BP: (98-135)/(50-75) 135/50 (07/06 0417) SpO2:  [95 %-100 %] 95 % (07/06 0417)   General: Alert and cooperative and appears to be in no acute distress Cardio: Normal S1 and S2, no S3 or S4. Rhythm is regular No murmurs or rubs.   Pulm: Clear to auscultation bilaterally, no crackles, wheezing,  or diminished breath sounds. Normal respiratory effort Abdomen: Bowel sounds normal. Abdomen soft and non-tender.  Extremities: No peripheral edema. Warm/ well perfused. PICC line site: not erythematous, non tender   Neuro:  Cranial nerves grossly intact  Laboratory: Recent Labs  Lab 12/07/18 0353 12/08/18 0306 12/09/18 0454  WBC 13.0* 12.8* 11.0*  HGB 8.8* 8.9* 9.0*  HCT 28.4* 28.8* 29.5*  PLT 430* 443* 458*   Recent Labs  Lab 12/04/18 1615  12/07/18 0409 12/08/18 0306 12/09/18 0454  NA 132*   < > 136 137 138  K 4.0   < > 4.8 4.3 4.0  CL 98   < > 101 103 102  CO2 21*   < > 27 25 26   BUN 23   < > 18 22 21   CREATININE 0.91   < > 0.85 0.76 0.67  CALCIUM 9.1   < > 8.9 9.4 9.1  PROT 7.8  --   --   --   --   BILITOT 0.3  --   --   --   --   ALKPHOS 68  --   --   --   --   ALT 19  --   --   --   --   AST 20  --   --   --   --   GLUCOSE 266*   < > 245* 170* 123*   < > = values in this interval not displayed.    Imaging/Diagnostic Tests: No results found.  [name], MD 12/09/2018, 7:46 AM PGY-3, [place] Intern pager: [phone], text pages welcome

## 2018-12-09 NOTE — Progress Notes (Addendum)
Patient ID: Olivia Romero, female   DOB: [birth year], 63 y.o.   MRN: [id number]         [facility] for Infectious Disease  Date of Admission:  12/04/2018           Day 4 vancomycin ASSESSMENT: Olivia Romero has a MRSA diabetic foot infection complicated by bacteremia with 1 blood culture positive upon admission. Transthoracic echocardiogram does not show any large vegetation or valvular dysfunction. Deferred TEE given alternative source with prolonged treatment indication with osteomyelitis. Her repeat blood cultures continue to be negative for growth. Staph aureus is growing from her surgical biopsy with pathology and sensitivities pending.   She is doing well now POD3 partial amputation left foot Olivia Romero). PICC is in place and awaiting SNF placement. Unable to go to inpatient rehab given 4 weeks of non-weight bearing status per podiatry recommendations.  Baseline CRP 22   PLAN: 1. Continue vancomycin 2. OPAT orders as previously entered.  3. Follow up with Dr. Megan Salon scheduled for 01/14/2019 in ID clinic.    Principal Problem:   MRSA bacteremia Active Problems:   Osteomyelitis of ankle or foot, acute, left (HCC)   Type 2 diabetes mellitus with diabetic nephropathy (HCC)   Post-operative state   Scheduled Meds: . DULoxetine  60 mg Oral QHS  . gabapentin  800 mg Oral TID  . heparin  5,000 Units Subcutaneous Q8H  . [START ON 12/10/2018] hydrochlorothiazide  6.25 mg Oral Daily  . insulin aspart  0-15 Units Subcutaneous TID WC  . insulin aspart  0-5 Units Subcutaneous QHS  . insulin glargine  8 Units Subcutaneous Daily  . sodium chloride flush  10-40 mL Intracatheter Q12H  . sodium chloride flush  3 mL Intravenous Once   Continuous Infusions: . sodium chloride    . lactated ringers 10 mL/hr at 12/06/18 0919  . vancomycin 1,500 mg (12/09/18 0414)   PRN Meds:.sodium chloride, albuterol, oxyCODONE-acetaminophen, sodium chloride flush   SUBJECTIVE: She is doing well. She is  trying to ensure her blood sugars are better with dietary changes and focusing more on meat and non-starchy veggies and no processed carbs. No concerns for side effects to antibiotics.    Review of Systems: Review of Systems  Constitutional: Negative for fever.  Gastrointestinal: Negative for nausea and vomiting.  Musculoskeletal: Positive for joint pain.  Skin: Negative for itching and rash.    Allergies  Allergen Reactions  . Eggs Or Egg-Derived Products Swelling and Other (See Comments)    Angioedema  . Atorvastatin Other (See Comments)    Liver toxicity  . Flu Virus Vaccine Swelling    Arm swelled (site of injection)  . Latex Itching  . Pravastatin Itching and Rash  . Tape Rash and Other (See Comments)    TAPE PULLS OFF THE SKIN!!! Please use an alternative!!    OBJECTIVE: Vitals:   12/08/18 1500 12/08/18 2004 12/09/18 0417 12/09/18 0842  BP: 110/61 98/75 (!) 135/50 (!) 115/53  Pulse: 79 63 (!) 58 75  Resp: 16 16 14 18   Temp: 98.6 F (37 C) 99.2 F (37.3 C) 98.5 F (36.9 C) 99.2 F (37.3 C)  TempSrc: Oral Oral Oral Oral  SpO2: 100% 100% 95% 100%  Weight:      Height:       Body mass index is 37.46 kg/m.  Physical Exam Constitutional:      Comments: Resting comfortably in recliner getting ready for lunch. Talkative, well appearing and in good spirits.   Cardiovascular:  Rate and Rhythm: Normal rate.     Heart sounds: No murmur.  Pulmonary:     Effort: Pulmonary effort is normal.  Musculoskeletal:     Comments: Clean and dry Ace wrap dressing on her left foot.  Skin:    General: Skin is warm and dry.     Capillary Refill: Capillary refill takes less than 2 seconds.     Comments: RUE PICC line c/d/i dressing.   Psychiatric:        Mood and Affect: Mood normal.     Lab Results Lab Results  Component Value Date   WBC 11.0 (H) 12/09/2018   HGB 9.0 (L) 12/09/2018   HCT 29.5 (L) 12/09/2018   MCV 83.8 12/09/2018   PLT 458 (H) 12/09/2018    Lab  Results  Component Value Date   CREATININE 0.67 12/09/2018   BUN 21 12/09/2018   NA 138 12/09/2018   K 4.0 12/09/2018   CL 102 12/09/2018   CO2 26 12/09/2018    Lab Results  Component Value Date   ALT 19 12/04/2018   AST 20 12/04/2018   ALKPHOS 68 12/04/2018   BILITOT 0.3 12/04/2018     Microbiology: Recent Results (from the past 240 hour(s))  WOUND CULTURE     Status: Abnormal   Collection Time: 12/03/18  4:36 PM   Specimen: Foot, Left; Wound  Result Value Ref Range Status   MICRO NUMBER: [id number]  Final   SPECIMEN QUALITY: Adequate  Final   SOURCE: FOOT, LEFT  Final   STATUS: FINAL  Final   GRAM STAIN:   Final    No white blood cells seen Few epithelial cells Rare Gram positive cocci in pairs Rare Gram positive bacilli   ISOLATE 1: methicillin resistant Staphylococcus aureus (A)  Final    Comment: Heavy growth of Methicillin resistant Staphylococcus aureus (MRSA) Negative for inducible clindamycin resistance.      Susceptibility   Methicillin resistant staphylococcus aureus - AEROBIC CULT, GRAM STAIN POSITIVE 1    VANCOMYCIN 1 Sensitive     CIPROFLOXACIN >=8 Resistant     CLINDAMYCIN <=0.25 Sensitive     LEVOFLOXACIN >=8 Resistant     ERYTHROMYCIN >=8 Resistant     GENTAMICIN <=0.5 Sensitive     OXACILLIN* NR Resistant      * Oxacillin-resistant staphylococci are resistant toall currently available beta-lactam antimicrobialagents including penicillins, beta lactam/beta-lactamase inhibitor combinations, and cephems withstaphylococcal indications, including Cefazolin.    TETRACYCLINE <=1 Sensitive     TRIMETH/SULFA* >=320 Resistant      * Oxacillin-resistant staphylococci are resistant toall currently available beta-lactam antimicrobialagents including penicillins, beta lactam/beta-lactamase inhibitor combinations, and cephems withstaphylococcal indications, including Cefazolin.Legend:S = Susceptible  I = IntermediateR = Resistant  NS = Not susceptible* = Not tested  NR =  Not reported**NN = See antimicrobic comments  Blood culture (routine x 2)     Status: None (Preliminary result)   Collection Time: 12/04/18  3:55 PM   Specimen: BLOOD  Result Value Ref Range Status   Specimen Description BLOOD RIGHT ANTECUBITAL  Final   Special Requests   Final    BOTTLES DRAWN AEROBIC AND ANAEROBIC Blood Culture results may not be optimal due to an inadequate volume of blood received in culture bottles   Culture   Final    NO GROWTH 4 DAYS Performed at [facility], [place] [address] [place], [place] [address]    Report Status PENDING  Incomplete  Urine culture     Status:  None   Collection Time: 12/04/18  5:12 PM   Specimen: Urine, Random  Result Value Ref Range Status   Specimen Description URINE, RANDOM  Final   Special Requests NONE  Final   Culture   Final    NO GROWTH Performed at [facility], [address], [place], [place] [address]    Report Status 12/05/2018 FINAL  Final  SARS Coronavirus 2 (CEPHEID- Performed in [place] hospital lab), Hosp Order     Status: None   Collection Time: 12/04/18  5:28 PM   Specimen: Nasopharyngeal Swab  Result Value Ref Range Status   SARS Coronavirus 2 NEGATIVE NEGATIVE Final    Comment: (NOTE) If result is NEGATIVE SARS-CoV-2 target nucleic acids are NOT DETECTED. The SARS-CoV-2 RNA is generally detectable in upper and lower  respiratory specimens during the acute phase of infection. The lowest  concentration of SARS-CoV-2 viral copies this assay can detect is 250  copies / mL. A negative result does not preclude SARS-CoV-2 infection  and should not be used as the sole basis for treatment or other  patient management decisions.  A negative result may occur with  improper specimen collection / handling, submission of specimen other  than nasopharyngeal swab, presence of viral mutation(s) within the  areas targeted by this assay, and inadequate number of viral copies  (<250 copies / mL). A negative  result must be combined with clinical  observations, patient history, and epidemiological information. If result is POSITIVE SARS-CoV-2 target nucleic acids are DETECTED. The SARS-CoV-2 RNA is generally detectable in upper and lower  respiratory specimens dur ing the acute phase of infection.  Positive  results are indicative of active infection with SARS-CoV-2.  Clinical  correlation with patient history and other diagnostic information is  necessary to determine patient infection status.  Positive results do  not rule out bacterial infection or co-infection with other viruses. If result is PRESUMPTIVE POSTIVE SARS-CoV-2 nucleic acids MAY BE PRESENT.   A presumptive positive result was obtained on the submitted specimen  and confirmed on repeat testing.  While 2019 novel coronavirus  (SARS-CoV-2) nucleic acids may be present in the submitted sample  additional confirmatory testing may be necessary for epidemiological  and / or clinical management purposes  to differentiate between  SARS-CoV-2 and other Sarbecovirus currently known to infect humans.  If clinically indicated additional testing with an alternate test  methodology [phone]) is advised. The SARS-CoV-2 RNA is generally  detectable in upper and lower respiratory sp ecimens during the acute  phase of infection. The expected result is Negative. Fact Sheet for Patients:  StrictlyIdeas.no Fact Sheet for Healthcare Providers: BankingDealers.co.za This test is not yet approved or cleared by the [place] FDA and has been authorized for detection and/or diagnosis of SARS-CoV-2 by FDA under an Emergency Use Authorization (EUA).  This EUA will remain in effect (meaning this test can be used) for the duration of the COVID-19 declaration under Section 564(b)(1) of the Act, 21 U.S.C. section 360bbb-3(b)(1), unless the authorization is terminated or revoked sooner. Performed at [facility], [place] [address] [place], [place] [address]   Blood culture (routine x 2)     Status: Abnormal   Collection Time: 12/04/18  5:37 PM   Specimen: BLOOD  Result Value Ref Range Status   Specimen Description BLOOD LEFT ANTECUBITAL  Final   Special Requests   Final    BOTTLES DRAWN AEROBIC AND ANAEROBIC Blood Culture adequate volume  Culture  Setup Time   Final    GRAM POSITIVE COCCI IN CLUSTERS IN BOTH AEROBIC AND ANAEROBIC BOTTLES CRITICAL RESULT CALLED TO, READ BACK BY AND VERIFIED WITH: PHARMD E [place] [id number] AT 33 BY CM Performed at [facility], [place] [address] [place], [place] [address]    Culture METHICILLIN RESISTANT STAPHYLOCOCCUS AUREUS (A)  Final   Report Status 12/07/2018 FINAL  Final   Organism ID, Bacteria METHICILLIN RESISTANT STAPHYLOCOCCUS AUREUS  Final      Susceptibility   Methicillin resistant staphylococcus aureus - MIC*    CIPROFLOXACIN >=8 RESISTANT Resistant     ERYTHROMYCIN >=8 RESISTANT Resistant     GENTAMICIN <=0.5 SENSITIVE Sensitive     OXACILLIN >=4 RESISTANT Resistant     TETRACYCLINE <=1 SENSITIVE Sensitive     VANCOMYCIN 1 SENSITIVE Sensitive     TRIMETH/SULFA >=320 RESISTANT Resistant     CLINDAMYCIN <=0.25 SENSITIVE Sensitive     RIFAMPIN <=0.5 SENSITIVE Sensitive     Inducible Clindamycin NEGATIVE Sensitive     * METHICILLIN RESISTANT STAPHYLOCOCCUS AUREUS  Blood Culture ID Panel (Reflexed)     Status: Abnormal   Collection Time: 12/04/18  5:37 PM  Result Value Ref Range Status   Enterococcus species NOT DETECTED NOT DETECTED Final   Listeria monocytogenes NOT DETECTED NOT DETECTED Final   Staphylococcus species DETECTED (A) NOT DETECTED Final    Comment: CRITICAL RESULT CALLED TO, READ BACK BY AND VERIFIED WITH: PHARMD E [place] [id number] AT 1310 BY CM    Staphylococcus aureus (BCID) DETECTED (A) NOT DETECTED Final    Comment: Methicillin (oxacillin)-resistant Staphylococcus aureus (MRSA). MRSA is predictably  resistant to beta-lactam antibiotics (except ceftaroline). Preferred therapy is vancomycin unless clinically contraindicated. Patient requires contact precautions if  hospitalized. CRITICAL RESULT CALLED TO, READ BACK BY AND VERIFIED WITH: PHARMD E [place] [id number] AT 1310 BY CM    Methicillin resistance DETECTED (A) NOT DETECTED Final    Comment: CRITICAL RESULT CALLED TO, READ BACK BY AND VERIFIED WITH: PHARMD E [place] [id number] AT 1310 BY CM    Streptococcus species NOT DETECTED NOT DETECTED Final   Streptococcus agalactiae NOT DETECTED NOT DETECTED Final   Streptococcus pneumoniae NOT DETECTED NOT DETECTED Final   Streptococcus pyogenes NOT DETECTED NOT DETECTED Final   Acinetobacter baumannii NOT DETECTED NOT DETECTED Final   Enterobacteriaceae species NOT DETECTED NOT DETECTED Final   Enterobacter cloacae complex NOT DETECTED NOT DETECTED Final   Escherichia coli NOT DETECTED NOT DETECTED Final   Klebsiella oxytoca NOT DETECTED NOT DETECTED Final   Klebsiella pneumoniae NOT DETECTED NOT DETECTED Final   Proteus species NOT DETECTED NOT DETECTED Final   Serratia marcescens NOT DETECTED NOT DETECTED Final   Haemophilus influenzae NOT DETECTED NOT DETECTED Final   Neisseria meningitidis NOT DETECTED NOT DETECTED Final   Pseudomonas aeruginosa NOT DETECTED NOT DETECTED Final   Candida albicans NOT DETECTED NOT DETECTED Final   Candida glabrata NOT DETECTED NOT DETECTED Final   Candida krusei NOT DETECTED NOT DETECTED Final   Candida parapsilosis NOT DETECTED NOT DETECTED Final   Candida tropicalis NOT DETECTED NOT DETECTED Final    Comment: Performed at [facility], [address], [place], [place] [address]  MRSA PCR Screening     Status: None   Collection Time: 12/05/18  5:22 AM   Specimen: Nasal Mucosa; Nasopharyngeal  Result Value Ref Range Status   MRSA by PCR NEGATIVE NEGATIVE Final    Comment:  The GeneXpert MRSA Assay (FDA approved for NASAL specimens  only), is one component of a comprehensive MRSA colonization surveillance program. It is not intended to diagnose MRSA infection nor to guide or monitor treatment for MRSA infections. Performed at [facility], [place] [address] [place], [place] [address]   Blood culture (routine x 2)     Status: None (Preliminary result)   Collection Time: 12/05/18  7:32 PM   Specimen: BLOOD  Result Value Ref Range Status   Specimen Description BLOOD LEFT ANTECUBITAL  Final   Special Requests   Final    BOTTLES DRAWN AEROBIC ONLY Blood Culture adequate volume   Culture   Final    NO GROWTH 3 DAYS Performed at [facility], [place] [address], [place], [place] [address]    Report Status PENDING  Incomplete  Aerobic/Anaerobic Culture (surgical/deep wound)     Status: None (Preliminary result)   Collection Time: 12/06/18 10:41 AM   Specimen: Foot, Left; Abscess  Result Value Ref Range Status   Specimen Description FOOT  Final   Special Requests LEFT SWAB  Final   Gram Stain   Final    RARE WBC PRESENT, PREDOMINANTLY PMN NO ORGANISMS SEEN Performed at [facility], [address] [place], [place] [address]    Culture   Final    RARE STAPHYLOCOCCUS AUREUS SUSCEPTIBILITIES TO FOLLOW NO ANAEROBES ISOLATED; CULTURE IN PROGRESS FOR 5 DAYS    Report Status PENDING  Incomplete    Janene Madeira, [place] [facility] [phone] pager   [phone] cell 12/09/2018, 12:21 PM

## 2018-12-09 NOTE — Progress Notes (Signed)
PHARMACY CONSULT NOTE FOR:  OUTPATIENT  PARENTERAL ANTIBIOTIC THERAPY (OPAT)  Indication: Bacteremia Regimen: Vancomycin 1500mg  IV q24h End date: 01/14/2019  IV antibiotic discharge orders are pended. To discharging provider:  please sign these orders via discharge navigator,  Select New Orders & click on the button choice - Manage This Unsigned Work.     Deaire Mcwhirter A. Levada Dy, PharmD, [place] Please utilize Amion for appropriate phone number to reach the unit pharmacist ([place])   12/09/2018, 9:35 AM

## 2018-12-09 NOTE — Progress Notes (Signed)
   HPI:  Status post partial fourth and fifth ray left foot with bone biopsy of the talus and navicular. DOS: 12/06/2018.  Patient doing well.  No pain.  No new complaints at this time. Patient arranged for DC 12/10/2018 to SNF. PICC line placement and ID discharge orders completed.   CBC Latest Ref Rng & Units 12/09/2018 12/08/2018 12/07/2018  WBC 4.0 - 10.5 K/uL 11.0(H) 12.8(H) 13.0(H)  Hemoglobin 12.0 - 15.0 g/dL 9.0(L) 8.9(L) 8.8(L)  Hematocrit 36.0 - 46.0 % 29.5(L) 28.8(L) 28.4(L)  Platelets 150 - 400 K/uL 458(H) 443(H) 430(H)     Past Medical History:  Diagnosis Date  . Anemia   . Arthritis   . Asthma   . Carpal tunnel syndrome on right   . Colon polyps    adenomatous  . Diabetes mellitus without complication ([place])   . Diabetic infection of left foot ([place]) 12/2018  . Diverticulosis of colon   . Esophagitis   . GERD (gastroesophageal reflux disease)   . Hemorrhoid    internal  . Hyperlipemia   . Hypertension   . Myalgia due to statin 11/19/2017  . Neuropathy   . Pneumonia   . PONV (postoperative nausea and vomiting)   . Skin ulcer of right ankle, limited to breakdown of skin ([place]) 12/31/2016  . Sleep apnea    has had in the past lost 50 pounds and do longer uses cpap  . Statin intolerance 04/23/2015  . Stroke-like episode ([place]) 2009   TIA  . 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 ([place]) 07/13/2015  . Uncontrolled type 2 diabetes mellitus with hyperglycemia ([place]) 11/01/2017      Objective/Physical Exam Vascular status intact.  Capillary refill within normal limits..  Skin incisions appear to be well coapted with sutures and staples intact. No active bleeding noted. Moderate edema noted to the surgical extremity with some ecchymosis.  Assessment: 1. s/p fourth and fifth ray amputations left foot with irrigation and debridement. DOS: 12/06/2018 2.  Cellulitis left foot 3.  Osteomyelitis left foot   Plan of Care:  -Dressings were changed today.  The incision  looks very good and well coapted. Sutures and staples are intact. -Culture growth MRSA. Continue antibiotic regimen as per ID. ID management greatly appreciated.  -Orders placed for dressing changes every other day at SNF.  -Okay for discharge from a podiatry/surgical standpoint. Continue NWB in CAM.  - f/u in office next week.   Edrick Kins, DPM [facility]  Dr. Edrick Kins, [place]                                        [place], [place] [address]                Office [phone]  Fax [phone]

## 2018-12-09 NOTE — Progress Notes (Signed)
Physical Therapy Treatment Patient Details Name: Olivia Romero MRN: [id number] DOB: 10/12/1955 Today's Date: 12/09/2018    History of Present Illness Olivia Romero is a 63 y.o. female presenting with sepsis diabetic L foot ulcer and r/u osteomyelitis. PMH is significant for poorly controlled T2DM, charcot foot, significant peripheral neuropathy, HTN, GERD, asthma. S/p partial left foot amputation and irrigation and debridement 7/3.     PT Comments    Pt has been set up with PICC line and is anticipating a trip to rehab for further strengthening and mobility training with NWB on LLE.  Pt has a boot in her room which she previously used to protect the L foot but now is just in a bandage.  Given the inversion of LLE ankle, will need to protect the skin from pressure on the bony prominences on lateral ankle.  Follow acutely for transfers and strengthening as tolerated.  Had conversation with pt to discuss her history of care with PT and LLE.  Follow Up Recommendations  CIR;Supervision for mobility/OOB     Equipment Recommendations  3in1 (PT)    Recommendations for Other Services Rehab consult     Precautions / Restrictions Precautions Precautions: Fall Restrictions Weight Bearing Restrictions: Yes LLE Weight Bearing: Non weight bearing(L foot skin breakdown and Charcot foot) Other Position/Activity Restrictions: assuming NWB, no orders in chart    Mobility  Bed Mobility               General bed mobility comments: up in chair  Transfers                 General transfer comment: up in chair  Ambulation/Gait                 Stairs             Wheelchair Mobility    Modified Rankin (Stroke Patients Only)       Balance     Sitting balance-Leahy Scale: Fair                                      Cognition Arousal/Alertness: Awake/alert Behavior During Therapy: WFL for tasks assessed/performed Overall Cognitive Status: Within  Functional Limits for tasks assessed                                        Exercises General Exercises - Lower Extremity Ankle Circles/Pumps: AROM;Right;5 reps Quad Sets: AROM;Both;10 reps Gluteal Sets: AROM;Both;10 reps Hip ABduction/ADduction: AROM;Both;10 reps Straight Leg Raises: AROM;Both;10 reps    General Comments General comments (skin integrity, edema, etc.): pt is waiting to hear about her rehab placement      Pertinent Vitals/Pain Pain Assessment: Faces Faces Pain Scale: Hurts a little bit Pain Location: back Pain Descriptors / Indicators: Aching Pain Intervention(s): Limited activity within patient's tolerance;Premedicated before session;Repositioned    Home Living                      Prior Function            PT Goals (current goals can now be found in the care plan section) Acute Rehab PT Goals Patient Stated Goal: wants to be safe Progress towards PT goals: Progressing toward goals    Frequency    Min 3X/week  PT Plan Current plan remains appropriate    Co-evaluation              AM-PAC PT "6 Clicks" Mobility   Outcome Measure  Help needed turning from your back to your side while in a flat bed without using bedrails?: None Help needed moving from lying on your back to sitting on the side of a flat bed without using bedrails?: None Help needed moving to and from a bed to a chair (including a wheelchair)?: A Little Help needed standing up from a chair using your arms (e.g., wheelchair or bedside chair)?: A Little Help needed to walk in hospital room?: Total Help needed climbing 3-5 steps with a railing? : Total 6 Click Score: 16    End of Session   Activity Tolerance: Patient tolerated treatment well Patient left: in chair;with call bell/phone within reach Nurse Communication: Mobility status PT Visit Diagnosis: Difficulty in walking, not elsewhere classified (R26.2);Unsteadiness on feet (R26.81)      Time: 4481-8563 PT Time Calculation (min) (ACUTE ONLY): 30 min  Charges:  $Therapeutic Exercise: 8-22 mins $Therapeutic Activity: 8-22 mins                    Ramond Dial 12/09/2018, 9:50 AM   Mee Hives, PT MS Acute Rehab Dept. Number: [place] and [place]

## 2018-12-10 LAB — CBC
HCT: 30 % — ABNORMAL LOW (ref 36.0–46.0)
Hemoglobin: 9.5 g/dL — ABNORMAL LOW (ref 12.0–15.0)
MCH: 26.4 pg (ref 26.0–34.0)
MCHC: 31.7 g/dL (ref 30.0–36.0)
MCV: 83.3 fL (ref 80.0–100.0)
Platelets: 480 10*3/uL — ABNORMAL HIGH (ref 150–400)
RBC: 3.6 MIL/uL — ABNORMAL LOW (ref 3.87–5.11)
RDW: 13.8 % (ref 11.5–15.5)
WBC: 11.3 10*3/uL — ABNORMAL HIGH (ref 4.0–10.5)
nRBC: 0 % (ref 0.0–0.2)

## 2018-12-10 LAB — CULTURE, BLOOD (ROUTINE X 2)
Culture: NO GROWTH
Special Requests: ADEQUATE

## 2018-12-10 LAB — GLUCOSE, CAPILLARY
Glucose-Capillary: 185 mg/dL — ABNORMAL HIGH (ref 70–99)
Glucose-Capillary: 179 mg/dL — ABNORMAL HIGH (ref 70–99)
Glucose-Capillary: 164 mg/dL — ABNORMAL HIGH (ref 70–99)
Glucose-Capillary: 158 mg/dL — ABNORMAL HIGH (ref 70–99)

## 2018-12-10 MED ORDER — HYDROCHLOROTHIAZIDE 10 MG/ML ORAL SUSPENSION
6.2500 mg | Freq: Every day | ORAL | Status: DC
  Administered 2018-12-10 – 2018-12-12 (×3): 6.25 mg via ORAL
  Filled 2018-12-10 (×6): qty 1.25

## 2018-12-10 NOTE — Progress Notes (Signed)
Physical Therapy Treatment Patient Details Name: Olivia Romero MRN: [id number] DOB: 08-20-1955 Today's Date: 12/10/2018    History of Present Illness Pt is a 63 y.o. female admitted 12/04/18 with sepsis secondary to diabetic L foot ulcer. S/p partial L foot amputation and I&D on 7/3. PMH includes DM2, charcot foot, peripheral neuropathy, HTN, asthma.   PT Comments    Pt progressing well with mobility. Pt able to progress transfer and gait training with RW and minA, hopping on RLE remains limited by generalized weakness and decreased activity tolerance. Pt remains highly motivated to participate. Discussed recommendation for SNF (instead of CIR) since pt to be LLE NWB for four weeks, pt in agreement with this. Will continue to follow acutely.   Follow Up Recommendations  SNF;Supervision for mobility/OOB     Equipment Recommendations  (TBD next venue)    Recommendations for Other Services       Precautions / Restrictions Precautions Precautions: Fall Restrictions Weight Bearing Restrictions: Yes LLE Weight Bearing: Non weight bearing Other Position/Activity Restrictions: Per Podiatry MD note 7/4    Mobility  Bed Mobility               General bed mobility comments: Received sitting in recliner  Transfers Overall transfer level: Needs assistance Equipment used: Rolling walker (2 wheeled) Transfers: Sit to/from Stand Sit to Stand: Min assist;Min guard         General transfer comment: Pt able to perform multiple sit<>stands from recliner to RW, initial minA for balance progressing to min guad; heavy reliance on BUE support  Ambulation/Gait Ambulation/Gait assistance: Min assist Gait Distance (Feet): 6 Feet Assistive device: Rolling walker (2 wheeled)   Gait velocity: Decreased Gait velocity interpretation: <1.31 ft/sec, indicative of household ambulator General Gait Details: Pt able to hop 3' + 3' forward with RW and minA for balance, minimal clearance of R  foot; requiring 2x seated rest break secondary to RLE/BUE fatigue and DOE. Frequent cues for deep breathing as pt holding breath when hopping   Stairs             Wheelchair Mobility    Modified Rankin (Stroke Patients Only)       Balance Overall balance assessment: Needs assistance   Sitting balance-Leahy Scale: Fair     Standing balance support: Bilateral upper extremity supported Standing balance-Leahy Scale: Poor                              Cognition Arousal/Alertness: Awake/alert Behavior During Therapy: WFL for tasks assessed/performed Overall Cognitive Status: Within Functional Limits for tasks assessed                                        Exercises General Exercises - Lower Extremity Long Arc Quad: AROM;Both;Seated Hip ABduction/ADduction: AROM;Both;Seated Hip Flexion/Marching: AROM;Both;Seated    General Comments        Pertinent Vitals/Pain Pain Assessment: No/denies pain Pain Intervention(s): Monitored during session    Home Living                      Prior Function            PT Goals (current goals can now be found in the care plan section) Acute Rehab PT Goals Patient Stated Goal: wants to be safe PT Goal Formulation: With patient Time For Goal Achievement:  12/21/18 Potential to Achieve Goals: Good Progress towards PT goals: Progressing toward goals    Frequency    Min 3X/week      PT Plan Discharge plan needs to be updated;Equipment recommendations need to be updated    Co-evaluation              AM-PAC PT "6 Clicks" Mobility   Outcome Measure  Help needed turning from your back to your side while in a flat bed without using bedrails?: None Help needed moving from lying on your back to sitting on the side of a flat bed without using bedrails?: None Help needed moving to and from a bed to a chair (including a wheelchair)?: A Little Help needed standing up from a chair using  your arms (e.g., wheelchair or bedside chair)?: A Little Help needed to walk in hospital room?: A Lot Help needed climbing 3-5 steps with a railing? : Total 6 Click Score: 17    End of Session Equipment Utilized During Treatment: Gait belt Activity Tolerance: Patient tolerated treatment well Patient left: in chair;with call bell/phone within reach Nurse Communication: Mobility status PT Visit Diagnosis: Difficulty in walking, not elsewhere classified (R26.2);Unsteadiness on feet (R26.81)     Time: 5170-0174 PT Time Calculation (min) (ACUTE ONLY): 24 min  Charges:  $Gait Training: 8-22 mins $Therapeutic Exercise: 8-22 mins                    Mabeline Caras, PT, DPT Acute Rehabilitation Services  Pager [phone] Office [place] 12/10/2018, 12:10 PM

## 2018-12-10 NOTE — Plan of Care (Signed)
  Problem: Education: Goal: Knowledge of General Education information will improve Description Including pain rating scale, medication(s)/side effects and non-pharmacologic comfort measures Outcome: Progressing   

## 2018-12-10 NOTE — Progress Notes (Addendum)
Occupational Therapy Treatment Patient Details Name: Olivia Romero MRN: [id number] DOB: [birth year] Today's Date: 12/10/2018    History of present illness Pt is a 63 y.o. female admitted 12/04/18 with sepsis secondary to diabetic L foot ulcer. S/p partial L foot amputation and I&D on 7/3. PMH includes DM2, charcot foot, peripheral neuropathy, HTN, asthma.   OT comments  Pt slowly progressing toward OT goals and tolerated session well with no complaints of pain. Pt demonstrated min A for BSC transfer. Min guard for safety, VCs for sequencing and hand placement. Pt educated of UE exercises with use of theraband to increase strength for functional transfers with use of RW. DC and freq remains the same. OT will continue to follow acutely.     Follow Up Recommendations  CIR;SNF;Supervision/Assistance - 24 hour    Equipment Recommendations  3 in 1 bedside commode    Recommendations for Other Services Rehab consult    Precautions / Restrictions Precautions Precautions: Fall Restrictions Weight Bearing Restrictions: Yes LLE Weight Bearing: Non weight bearing Other Position/Activity Restrictions: Per Podiatry MD note 7/4       Mobility Bed Mobility               General bed mobility comments: Received sitting in recliner  Transfers Overall transfer level: Needs assistance Equipment used: Rolling walker (2 wheeled) Transfers: Sit to/from Stand Sit to Stand: Min assist Stand pivot transfers: Min assist       General transfer comment: transfer from chair to bsc. BSC had to be moved closer due to fatigue during transfer.     Balance Overall balance assessment: Needs assistance Sitting-balance support: Bilateral upper extremity supported;Feet supported Sitting balance-Leahy Scale: Fair     Standing balance support: Bilateral upper extremity supported Standing balance-Leahy Scale: Poor Standing balance comment: reliant of RW                           ADL  either performed or assessed with clinical judgement   ADL Overall ADL's : Needs assistance/impaired                         Toilet Transfer: Stand-pivot;Cueing for sequencing;Cueing for safety;RW;BSC;Minimal assistance Toilet Transfer Details (indicate cue type and reason): VCs required for safety awareness/sequencing and safe use of RW during stand to sit transfers.         Functional mobility during ADLs: Rolling walker;Cueing for safety;Cueing for sequencing;Minimal assistance(stand pivot to bsc) General ADL Comments: Min A bsc transfer for safety. Pt reports weakness of BUE during transfer limiting functional transfers with RW.      Vision Baseline Vision/History: Wears glasses     Perception     Praxis      Cognition Arousal/Alertness: Awake/alert Behavior During Therapy: WFL for tasks assessed/performed Overall Cognitive Status: Within Functional Limits for tasks assessed                                 General Comments: improved safety awareness from prior session.         Exercises Exercises: General Upper Extremity General Exercises - Upper Extremity Shoulder Horizontal ABduction: Strengthening;10 reps;Seated;Theraband Theraband Level (Shoulder Horizontal Abduction): Level 2 (Red) Elbow Flexion: Strengthening;Both;10 reps;Seated;Theraband Theraband Level (Elbow Flexion): Level 2 (Red) Elbow Extension: Strengthening;Both;10 reps;Seated;Theraband Theraband Level (Elbow Extension): Level 2 (Red) General Exercises - Lower Extremity Long Arc Quad: AROM;Both;Seated Hip ABduction/ADduction: AROM;Both;Seated  Hip Flexion/Marching: AROM;Both;Seated   Shoulder Instructions       General Comments pt educated of UE exercises with use orange therabanc    Pertinent Vitals/ Pain       Pain Assessment: No/denies pain Pain Descriptors / Indicators: Aching Pain Intervention(s): Monitored during session;Repositioned  Home Living Family/patient  expects to be discharged to:: Private residence Living Arrangements: Children(son, daughter in Sports coach) Available Help at Discharge: Family;Available PRN/intermittently Type of Home: House Home Access: Stairs to enter [employer] of Steps: 2 Entrance Stairs-Rails: None Home Layout: One level     Bathroom Shower/Tub: Teacher, early years/pre: Standard     Home Equipment: Environmental consultant - 4 wheels;Adaptive equipment;Crutches;Walker - 2 wheels;Wheelchair - manual   Additional Comments: Son works during the day and daughter will currently receive back surgery decreasing her assistance in home setting.       Prior Functioning/Environment Level of Independence: Independent with assistive device(s)        Comments: used cane    Frequency  Min 2X/week        Progress Toward Goals  OT Goals(current goals can now be found in the care plan section)  Progress towards OT goals: Progressing toward goals  Acute Rehab OT Goals Patient Stated Goal: wants to be safe OT Goal Formulation: With patient Time For Goal Achievement: 12/21/18 Potential to Achieve Goals: Fair ADL Goals Pt Will Transfer to Toilet: with supervision;ambulating;bedside commode Pt Will Perform Toileting - Clothing Manipulation and hygiene: with modified independence;sit to/from stand Pt Will Perform Tub/Shower Transfer: with supervision;Tub transfer;tub bench;rolling walker  Plan Discharge plan remains appropriate;Frequency remains appropriate    Co-evaluation                 AM-PAC OT "6 Clicks" Daily Activity     Outcome Measure   Help from another person eating meals?: A Little Help from another person taking care of personal grooming?: A Little Help from another person toileting, which includes using toliet, bedpan, or urinal?: A Lot Help from another person bathing (including washing, rinsing, drying)?: A Little Help from another person to put on and taking off regular upper body  clothing?: None Help from another person to put on and taking off regular lower body clothing?: A Little 6 Click Score: 18    End of Session Equipment Utilized During Treatment: Gait belt;Rolling walker  OT Visit Diagnosis: Unsteadiness on feet (R26.81);Pain;History of falling (Z91.81) Pain - Right/Left: Left Pain - part of body: Ankle and joints of foot   Activity Tolerance Patient tolerated treatment well   Patient Left in chair;with call bell/phone within reach   Nurse Communication Mobility status        Time: 1415-1440 OT Time Calculation (min): 25 min  Charges: OT General Charges $OT Visit: 1 Visit OT Treatments $Self Care/Home Management : 8-22 mins $Therapeutic Exercise: 8-22 mins  Minus Breeding, MSOT, OTR/L  Supplemental Rehabilitation Services  [phone]    Marius Ditch 12/10/2018, 2:58 PM

## 2018-12-10 NOTE — Care Management Important Message (Signed)
Important Message  Patient Details  Name: Olivia Romero MRN: [id number] Date of Birth: [birth year]   Medicare Important Message Given:  Yes     Memory Argue 12/10/2018, 3:10 PM     CONTACT PRECAUTION NO IM GIVEN

## 2018-12-10 NOTE — NC FL2 (Signed)
[place] LEVEL OF CARE SCREENING TOOL     IDENTIFICATION  Patient Name: Olivia Romero Birthdate: 08-26-1955 Sex: female Admission Date (Current Location): 12/04/2018  [facility] and [place] Number:  Herbalist and Address:  [facility] [place], [place] [address], [place], [place] [address]      Provider Number: [id number]  Attending Physician Name and Address:  McDiarmid, Blane Ohara, MD  Relative Name and Phone Number:  Merrily Pew [phone]    Current Level of Care: Hospital Recommended Level of Care: [place] Prior Approval Number:    Date Approved/Denied:   PASRR Number: [id number]  Discharge Plan: SNF    Current Diagnoses: Patient Active Problem List   Diagnosis Date Noted  . Post-operative state   . PAD (peripheral artery disease) ([place]) 12/05/2018  . Osteomyelitis of ankle or foot, acute, left ([place]) 12/05/2018  . MRSA bacteremia 12/05/2018  . Sepsis without acute organ dysfunction ([place])   . Charcot's joint of foot due to diabetes ([place]) 09/18/2018  . BMI 36.0-36.9,adult   . Morbid obesity ([place]) 11/19/2017  . B12 deficiency 11/01/2017  . Diabetic foot ulcer ([place]) 12/14/2016  . Diabetic polyneuropathy associated with type 2 diabetes mellitus ([place]) 12/13/2015  . Lumbar stenosis with neurogenic claudication 09/02/2015  . Chronic constipation 07/13/2015  . OSA (obstructive sleep apnea) 06/25/2015  . 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/23/2015  . Hypertriglyceridemia 04/23/2015  . Asthma, mild intermittent 02/01/2015  . Type 2 diabetes mellitus with diabetic nephropathy ([place]) 12/31/2014  . Gastroesophageal reflux disease with esophagitis 12/31/2014  . Dysphagia 12/31/2014  . Bilateral carpal tunnel syndrome 12/01/2014  . Diverticulosis of colon (without mention of hemorrhage) 11/30/2012  . Essential hypertension, benign 11/28/2012    Orientation RESPIRATION BLADDER Height & Weight     Self,  Time, Situation, Place  Normal Incontinent, External catheter Weight: 191 lb 12.8 oz (87 kg) Height:  5' (152.4 cm)  BEHAVIORAL SYMPTOMS/MOOD NEUROLOGICAL BOWEL NUTRITION STATUS      Continent Diet(see discharge summary)  AMBULATORY STATUS COMMUNICATION OF NEEDS Skin   Extensive Assist Verbally Other (Comment)(left foot closed surgical incision)                       Personal Care Assistance Level of Assistance  Bathing, Dressing, Total care, Feeding Bathing Assistance: Limited assistance Feeding assistance: Independent Dressing Assistance: Limited assistance Total Care Assistance: Maximum assistance   Functional Limitations Info  Sight, Hearing, Speech Sight Info: Adequate Hearing Info: Adequate Speech Info: Adequate    SPECIAL CARE FACTORS FREQUENCY  PT (By licensed PT), OT (By licensed OT)     PT Frequency: min 5x weekly OT Frequency: min 5x weekly            Contractures Contractures Info: Not present    Additional Factors Info  Code Status, Allergies, Isolation Precautions Code Status Info: Full Allergies Info: Eggs Or Egg-derived Products Atorvastatin Flu Virus Vaccine Latex Pravastatin Tape     Isolation Precautions Info: MRSA     Current Medications (12/10/2018):  This is the current hospital active medication list Current Facility-Administered Medications  Medication Dose Route Frequency Provider Last Rate Last Dose  . 0.9 %  sodium chloride infusion  250 mL Intravenous PRN Edrick Kins, DPM      . albuterol (PROVENTIL) (2.5 MG/3ML) 0.083% nebulizer solution 3 mL  3 mL Inhalation Q6H PRN Anderson, Chelsey L, DO      . DULoxetine (CYMBALTA) DR capsule 60 mg  60 mg Oral QHS Anderson, Chelsey L, DO   60 mg at 12/09/18 2115  . gabapentin (NEURONTIN) capsule 800 mg  800 mg Oral TID Ouida Sills, Chelsey L, DO   800 mg at 12/10/18 0913  . heparin injection 5,000 Units  5,000 Units Subcutaneous Q8H Anderson, Chelsey L, DO   5,000 Units at 12/10/18 2426  .  hydrochlorothiazide 10 mg/mL oral suspension 6.25 mg  6.25 mg Oral Daily McDiarmid, Blane Ohara, MD   6.25 mg at 12/10/18 0913  . insulin aspart (novoLOG) injection 0-15 Units  0-15 Units Subcutaneous TID WC Anderson, Chelsey L, DO   3 Units at 12/10/18 0912  . insulin aspart (novoLOG) injection 0-5 Units  0-5 Units Subcutaneous QHS Anderson, Chelsey L, DO   2 Units at 12/09/18 2256  . insulin glargine (LANTUS) injection 8 Units  8 Units Subcutaneous Daily Matilde Haymaker, MD   8 Units at 12/10/18 0913  . lactated ringers infusion   Intravenous Continuous Catalina Gravel, MD 10 mL/hr at 12/06/18 0919    . oxyCODONE-acetaminophen (PERCOCET) 7.5-325 MG per tablet 1 tablet  1 tablet Oral Q8H PRN Guadalupe Dawn, MD   1 tablet at 12/09/18 1826  . sodium chloride flush (NS) 0.9 % injection 10-40 mL  10-40 mL Intracatheter Q12H McDiarmid, Blane Ohara, MD   10 mL at 12/10/18 0914  . sodium chloride flush (NS) 0.9 % injection 10-40 mL  10-40 mL Intracatheter PRN McDiarmid, Blane Ohara, MD      . sodium chloride flush (NS) 0.9 % injection 3 mL  3 mL Intravenous Once Anderson, Chelsey L, DO      . vancomycin (VANCOCIN) 1,500 mg in sodium chloride 0.9 % 500 mL IVPB  1,500 mg Intravenous Q24H Erenest Blank, RPH 250 mL/hr at 12/10/18 0249 1,500 mg at 12/10/18 0249     Discharge Medications: Please see discharge summary for a list of discharge medications.  Relevant Imaging Results:  Relevant Lab Results:   Additional Information SSN: [id number]  Alberteen Sam, LCSW

## 2018-12-10 NOTE — Progress Notes (Addendum)
Family Medicine Teaching Service Daily Progress Note Intern Pager: [phone]  Patient name: Olivia Romero Medical record number: [id number] Date of birth: July 10, 1955 Age: 63 y.o. Gender: female  Primary Care Provider: Arnetha Courser, MD Consultants: ID, podiatry Code Status: Full  Pt Overview and Major Events to Date:  7/1-admission for sepsis secondary to osteomyelitis, bacteremia 7/3-partial foot amputation with irrigation and debridement of left foot with bone biopsy  Antibiotics: Vancomycin (7/1- ) Zosyn (7/1-7/2)  Assessment and Plan: Olivia Romero is a 63 y.o. female presenting with sepsis. PMH is significant for poorly controlled T2DM, charcot foot, significant peripheral neuropathy, HTN, GERD, asthma  Left foot osteomyelitis, status post ray amputation Status post ray amp on 7/3.  Patient had episodes of hypotension on 7/4, but most recent blood pressure check 116/65.  Echo on 7/3 unremarkable.  Podiatry following for postop wound care. Culture still pending, reincubated for better growth.  Strict nonweightbearing left lower extremity for 4 weeks.  Not a candidate for inpatient rehab until weightbearing, will likely need SNF for a few weeks and then transition to rehab afterwards.  ID also following appreciate their recommendations -Per ID continue long-term vancomycin, PICC line  placed 06/07, follow up ID clinic on 01/14/19 -Follow-up podiatry recs, per their last note will come for wound check p.m. of 7/6 -Follow-up surgical culture, reaccumulated better growth -Tylenol PRN  MRSA bacteremia ID following.  1 out of 2 initial or MRSA.  Repeat blood cultures negative at 2 days.  Per ID recs  IV vancomycin likely long-term.  Echo without any abnormality, no indication for TEE per ID.  Anemia Hemoglobin 9.5 today.  Admission hemoglobin 11.2, baseline around 11.  Likely secondary to blood loss anemia in setting of surgery. Monitor Hb, and order iron studies on discharge if  still anemic. Follow up anemia on an OPD basis.  Diabetes Blood glucose in the past 24 hours from 123-222. 16 units aspart given in past 24 hours.  Elevated blood sugars likely acute phase reactant secondary to stress from surgery.  Sugars with much better control since starting Lantus.  No adjustment to medications needed at this point. -Start Lantus 8 mg daily -SSI moderate -At bedtime correction -Monitor CBGs  Hypertension-controlled Home medication includes HCTZ.  Blood pressure has been labile. BP as low at 98/52 . And maximum 135/50 over past 24 hrs. --Reduce HCTZ to 6.25mg  daily   Charcot foot, neuropathy -Duloxetine 60 mg daily -Gabapentin 800 mg 3 times daily  GERD Patient was not taking Protonix at home.  -Protonix 40 mg daily stopped  Disposition Physical therapy recommending CIR.  Unfortunately patient is nonweightbearing for 4 weeks per podiatry note.  Will need to go to SNF until able to bear weight on left foot and have consulted social work for assistance with SNF placement.  FEN/GI: Carb modified PPx: Heparin  Disposition: awaiting discharge to SNF  Subjective:  Sat up in chair chatting. Feels well. Denies chest pain, dyspnea, nausea or vomiting. No complaints. Saw surgeon yesterday who said wound was improving.   Objective: Temp:  [98.1 F (36.7 C)-99.2 F (37.3 C)] 98.1 F (36.7 C) (07/07 0512) Pulse Rate:  [59-75] 71 (07/07 0512) Resp:  [16-18] 17 (07/07 0512) BP: (113-115)/(53-63) 113/53 (07/07 0512) SpO2:  [99 %-100 %] 99 % (07/07 0512)   General: Alert and cooperative and appears to be in no acute distress Cardio: Normal S1 and S2, no S3 or S4. Rhythm is regular No murmurs or rubs.   Pulm: Clear  to auscultation bilaterally, no crackles, wheezing, or diminished breath sounds. Normal respiratory effort Abdomen: Bowel sounds normal. Abdomen soft and non-tender.  Extremities: No peripheral edema. Warm/ well perfused. PICC line site: not erythematous,  non tender   Neuro: Cranial nerves grossly intact  Laboratory: Recent Labs  Lab 12/08/18 0306 12/09/18 0454 12/10/18 0341  WBC 12.8* 11.0* 11.3*  HGB 8.9* 9.0* 9.5*  HCT 28.8* 29.5* 30.0*  PLT 443* 458* 480*   Recent Labs  Lab 12/04/18 1615  12/07/18 0409 12/08/18 0306 12/09/18 0454  NA 132*   < > 136 137 138  K 4.0   < > 4.8 4.3 4.0  CL 98   < > 101 103 102  CO2 21*   < > 27 25 26   BUN 23   < > 18 22 21   CREATININE 0.91   < > 0.85 0.76 0.67  CALCIUM 9.1   < > 8.9 9.4 9.1  PROT 7.8  --   --   --   --   BILITOT 0.3  --   --   --   --   ALKPHOS 68  --   --   --   --   ALT 19  --   --   --   --   AST 20  --   --   --   --   GLUCOSE 266*   < > 245* 170* 123*   < > = values in this interval not displayed.    Imaging/Diagnostic Tests: No results found.  Olivia Haw, MD 12/10/2018, 7:39 AM PGY-3, Olivia Romero Intern pager: [phone], text pages welcome

## 2018-12-10 NOTE — TOC Progression Note (Signed)
ransition of Care [facility]) - Progression Note    Patient Details  Name: Olivia Romero MRN: [id number] Date of Birth: 08-29-1955  Transition of Care [facility]) CM/SW Contact  Ninfa Meeker, RN Phone Number: [phone] ( working remotely) 12/10/2018, 12:15 PM  Clinical Narrative:   63 yr old female s/p left foot partial amputation. Case manager spoke with patient concerning need for shortterm rehab at a facility. Received permission to fax her out to Skilled facilities. She requests to be near the [place] area. Case manager utilized [employer] ,Forensic scientist for facility search. Patient says she lives home with family and will return there after rehab.     Expected Discharge Plan: [place] Barriers to Discharge: No Barriers Identified  Expected Discharge Plan and Services Expected Discharge Plan: [place]   Discharge Planning Services: CM Consult Post Acute Care Choice: [place] Living arrangements for the past 2 months: Single Family Home                 DME Arranged: Access ramp, N/A         HH Arranged: NA           Social Determinants of Health (SDOH) Interventions    Readmission Risk Interventions No flowsheet data found.

## 2018-12-11 ENCOUNTER — Ambulatory Visit: Payer: Medicare Other | Admitting: Podiatry

## 2018-12-11 ENCOUNTER — Telehealth: Payer: Self-pay | Admitting: Podiatry

## 2018-12-11 ENCOUNTER — Encounter: Payer: Self-pay | Admitting: Podiatry

## 2018-12-11 DIAGNOSIS — M14672 Charcot's joint, left ankle and foot: Secondary | ICD-10-CM

## 2018-12-11 LAB — CBC
HCT: 31.3 % — ABNORMAL LOW (ref 36.0–46.0)
HCT: 30.8 % — ABNORMAL LOW (ref 36.0–46.0)
Hemoglobin: 9.8 g/dL — ABNORMAL LOW (ref 12.0–15.0)
Hemoglobin: 9.8 g/dL — ABNORMAL LOW (ref 12.0–15.0)
MCH: 26.1 pg (ref 26.0–34.0)
MCH: 26.3 pg (ref 26.0–34.0)
MCHC: 31.3 g/dL (ref 30.0–36.0)
MCHC: 31.8 g/dL (ref 30.0–36.0)
MCV: 83.2 fL (ref 80.0–100.0)
MCV: 82.6 fL (ref 80.0–100.0)
Platelets: 482 10*3/uL — ABNORMAL HIGH (ref 150–400)
Platelets: 503 10*3/uL — ABNORMAL HIGH (ref 150–400)
RBC: 3.76 MIL/uL — ABNORMAL LOW (ref 3.87–5.11)
RBC: 3.73 MIL/uL — ABNORMAL LOW (ref 3.87–5.11)
RDW: 13.6 % (ref 11.5–15.5)
RDW: 13.7 % (ref 11.5–15.5)
WBC: 11.6 10*3/uL — ABNORMAL HIGH (ref 4.0–10.5)
WBC: 10.6 10*3/uL — ABNORMAL HIGH (ref 4.0–10.5)
nRBC: 0 % (ref 0.0–0.2)
nRBC: 0 % (ref 0.0–0.2)

## 2018-12-11 LAB — BASIC METABOLIC PANEL
Anion gap: 11 (ref 5–15)
BUN: 18 mg/dL (ref 8–23)
CO2: 22 mmol/L (ref 22–32)
Calcium: 8.9 mg/dL (ref 8.9–10.3)
Chloride: 103 mmol/L (ref 98–111)
Creatinine, Ser: 0.89 mg/dL (ref 0.44–1.00)
GFR calc Af Amer: >60 mL/min (ref >60)
GFR calc non Af Amer: >60 mL/min (ref >60)
Glucose, Bld: 229 mg/dL — ABNORMAL HIGH (ref 70–99)
Potassium: 4.1 mmol/L (ref 3.5–5.1)
Sodium: 136 mmol/L (ref 135–145)

## 2018-12-11 LAB — CREATININE, SERUM
Creatinine, Ser: 0.75 mg/dL (ref 0.44–1.00)
GFR calc Af Amer: >60 mL/min (ref >60)
GFR calc non Af Amer: >60 mL/min (ref >60)

## 2018-12-11 LAB — SARS CORONAVIRUS 2 BY RT PCR (HOSPITAL ORDER, PERFORMED IN ~~LOC~~ HOSPITAL LAB): SARS Coronavirus 2: NEGATIVE

## 2018-12-11 LAB — GLUCOSE, CAPILLARY
Glucose-Capillary: 155 mg/dL — ABNORMAL HIGH (ref 70–99)
Glucose-Capillary: 179 mg/dL — ABNORMAL HIGH (ref 70–99)
Glucose-Capillary: 247 mg/dL — ABNORMAL HIGH (ref 70–99)
Glucose-Capillary: 164 mg/dL — ABNORMAL HIGH (ref 70–99)

## 2018-12-11 NOTE — TOC Progression Note (Addendum)
Transition of Care [facility]) - Progression Note    Patient Details  Name: Olivia Romero MRN: [id number] Date of Birth: 1955/07/20  Transition of Care [facility]) CM/SW [place], [place] Phone Number: 12/11/2018, 3:59 PM  Clinical Narrative:     [name] reports [facility] have a bed avail tomorrow 7/9. Please prepare for patient's discharge tomorrow 7/9 with discharge summary and orders. Facility requesting an updated COVID test (needs to be within 48 hours of admission), paged family medicine.     Expected Discharge Plan: [place] Barriers to Discharge: No Barriers Identified  Expected Discharge Plan and Services Expected Discharge Plan: [place]   Discharge Planning Services: CM Consult Post Acute Care Choice: [place] Living arrangements for the past 2 months: Single Family Home                 DME Arranged: Access ramp, N/A         HH Arranged: NA           Social Determinants of Health (SDOH) Interventions    Readmission Risk Interventions No flowsheet data found.

## 2018-12-11 NOTE — Telephone Encounter (Signed)
Pt has called and stated that she is still at [facility] from the sx that Dr. [name] performed. She is wondering about someone coming to speak to her about rehab. She asked the nurses this morning about what should she do. Dr. [name]  Has changed her bandages on 12/09/2018. Please call and let her know what she needs to do next.

## 2018-12-11 NOTE — Plan of Care (Signed)
  Problem: Education: Goal: Knowledge of General Education information will improve Description Including pain rating scale, medication(s)/side effects and non-pharmacologic comfort measures Outcome: Progressing   

## 2018-12-11 NOTE — Progress Notes (Signed)
Olivia Romero (daughter in law) [phone]  has been updated per the patient with the nurse at the bedside.  The contact person doe not have any questions or concerns at this time.

## 2018-12-11 NOTE — Progress Notes (Signed)
Family Medicine Teaching Service Daily Progress Note Intern Pager: [phone]  Patient name: Olivia Romero Medical record number: [id number] Date of birth: 06/22/1955 Age: 63 y.o. Gender: female  Primary Care Provider: Arnetha Courser, MD Consultants: ID, podiatry Code Status: Full  Pt Overview and Major Events to Date:  7/1-admission for sepsis secondary to osteomyelitis, bacteremia 7/3-partial foot amputation with irrigation and debridement of left foot with bone biopsy  Antibiotics: Vancomycin (7/1- ) Zosyn (7/1-7/2)  Assessment and Plan: Olivia Romero is a 63 y.o. female presenting with sepsis. PMH is significant for poorly controlled T2DM, charcot foot, significant peripheral neuropathy, HTN, GERD, asthma   Left foot osteomyelitis, status post ray amputation-stable Status post ray amp on 7/3.   -Strict nonweightbearing left lower extremity for 4 weeks.  Not a candidate for inpatient rehab until weightbearing, will likely need SNF for a few weeks and then transition to rehab afterwards.  ID also following appreciate their recommendations -Per ID continue long-term vancomycin, PICC line  placed 06/07, follow up ID clinic on 01/14/19 -Follow-up podiatry recs, per their last note will come for wound check p.m. of 7/6 -Follow-up surgical culture, reaccumulated better growth -Tylenol PRN  MRSA bacteremia ID following. 1 out of 2 initial or MRSA.  Repeat blood cultures negative at 2 days.  Per ID recs  IV vancomycin likely long-term.  Echo without any abnormality, no indication for TEE per ID.  Anemia-stable Hemoglobin 9.8. Admission hemoglobin 11.2, baseline around 11.  Likely secondary to blood loss anemia in setting of surgery. -monitor Hb, and order iron studies on discharge if still anemic.  -Follow up anemia on an OPD basis with PCP  Diabetes CBG 158.  Elevated blood sugars likely acute phase reactant secondary to stress from surgery.  Sugars with much better control  since starting Lantus.  No adjustment to medications needed at this point. -Lantus 8 mg daily -SSI moderate -At bedtime correction -Monitor CBGs  Hypertension-controlled Home medication includes HCTZ.  Blood pressure has been labile. BP113/41.  -Stopped HCTZ  Charcot foot, neuropathy -Duloxetine 60 mg daily -Gabapentin 800 mg 3 times daily  Disposition Awaiting placement to SNF  FEN/GI: Carb modified PPx: Heparin  Disposition: awaiting discharge to SNF. Medically stable for discharge. Subjective:  Sat up in bed talking. Feels well. Denies chest pain, dyspnea, nausea or vomiting. No new concerns. Objective: Temp:  [98.7 F (37.1 C)-99.1 F (37.3 C)] 98.7 F (37.1 C) (07/08 0422) Pulse Rate:  [60-66] 65 (07/08 0422) Resp:  [16-17] 16 (07/08 0422) BP: (113-128)/(41-59) 113/41 (07/08 0422) SpO2:  [92 %-100 %] 92 % (07/08 0422)   General: Alert and cooperative and appears to be in no acute distress Cardio: Normal S1 and S2, no S3 or S4. Rhythm is regular No murmurs or rubs.   Pulm: Clear to auscultation bilaterally, no crackles, wheezing, or diminished breath sounds. Normal respiratory effort Abdomen: Bowel sounds normal. Abdomen soft and non-tender.  Extremities: No peripheral edema. Warm/ well perfused.  Neuro: Cranial nerves grossly intact  Doing well with physical therapy.  Laboratory: Recent Labs  Lab 12/09/18 0454 12/10/18 0341 12/11/18 0437  WBC 11.0* 11.3* 11.6*  HGB 9.0* 9.5* 9.8*  HCT 29.5* 30.0* 31.3*  PLT 458* 480* 482*   Recent Labs  Lab 12/04/18 1615  12/07/18 0409 12/08/18 0306 12/09/18 0454 12/11/18 0437  NA 132*   < > 136 137 138  --   K 4.0   < > 4.8 4.3 4.0  --   CL 98   < >  101 103 102  --   CO2 21*   < > 27 25 26   --   BUN 23   < > 18 22 21   --   CREATININE 0.91   < > 0.85 0.76 0.67 0.75  CALCIUM 9.1   < > 8.9 9.4 9.1  --   PROT 7.8  --   --   --   --   --   BILITOT 0.3  --   --   --   --   --   ALKPHOS 68  --   --   --   --   --    ALT 19  --   --   --   --   --   AST 20  --   --   --   --   --   GLUCOSE 266*   < > 245* 170* 123*  --    < > = values in this interval not displayed.    Imaging/Diagnostic Tests: No results found.  [name], MD 12/11/2018, 6:59 AM PGY-3, [place] Intern pager: [phone], text pages welcome

## 2018-12-11 NOTE — Telephone Encounter (Signed)
Yes, dressing change orders were placed for discharge on Monday. Please set up a POV for Monday, 7/13 with Dr. Jacqualyn Posey on the nurse schedule.  Thanks, Dr. [name]

## 2018-12-11 NOTE — Discharge Summary (Addendum)
[facility] Discharge Summary  Patient name: Olivia Romero Medical record number: [id number] Date of birth: 03-05-1956 Age: 63 y.o. Gender: female Date of Admission: 12/04/2018  Date of Discharge: 12/12/18 Admitting Physician: Blane Ohara McDiarmid, MD  Primary Care Provider: Arnetha Courser, MD Consultants: Podiatry, Infectious disease  Indication for Hospitalization:  7/1-admission for sepsis secondary to osteomyelitis, bacteremia 7/3-partial foot amputation with irrigation and debridement of left foot with bone biopsy  Discharge Diagnoses/Problem List:  Essential hypertension, benign Type 2 diabetes mellitus with diabetic nephropathy ([place]) Diabetic polyneuropathy associated with type 2 diabetes mellitus ([place]) Diabetic foot ulcer ([place]) Charcot's joint of foot due to diabetes ([place]) PAD (peripheral artery disease) ([place]) MRSA bacteriemia  Anemia   Disposition: SNF placement   Discharge Condition: Acute osteomyeltis of left foot  Discharge Exam:  General: Pt looks well today. Alert and cooperative. Appears to be in no acute distress Cardio: Normal S1 and S2, no S3 or S4. Rhythm is regular. No murmur, rubs or gallops Pulm: Clear to auscultate bilaterally. No crackles, wheezing or diminished breath sounds. Normal respiratory effort. Abdomen: Abdomen soft non-tender, bowel sounds present. Extremities: No peripheral edema. Warm/well perfused.  Neuro: cranial nerves grossly in tact  Brief Hospital Course:  Olivia Romero presented on 12/05/18 with a temperature of 103.3 degrees. She was found to be septic secondary to a left foot ulcer and osteomyelitis.   The ulcer had developed over a year and acutely worsened just prior to her admission. She had been treated with Augmentin 1 week prior to this which had improved slightly. She attended ED after developing a fever at the instructions of her podiatrist. MRI showed evidence of osteomyelitis at cuboid articulation  with the 4th and 5th metatarsal, left foot cellulitis and myositis and a possible left foot abscess. In ED she was started on IV Vancomycin and IV Zosyn. Gram stain and cultures of the wound were taken which were positive for MRSA. She underwent surgery for a partial lateral foot amputation left foot. Bone biopsies (of healthy bone remaining in foot) were also taken to determine osteomyelitis versus charcot, the results of which are still pending. A PICC line was administered for 6 week Vancomycin. She has been doing well with the physical therapists and is ready for discharge to her SNF.  Issues for Follow Up:  1. Olivia Romero's baseline Hb is usually around 11. On admission the Hb ws 11.2 which dropped to 9.6 on day 2 of hospital admission and then dropped to Hb 8.8 following the amputation. On discharge the Hb is  Please kindly monitor Hb and consider investigating cause of anemia if Hb consistently low ie Iron studies.Hb on discharge is 9.8 2. Olivia Romero's blood pressure has been low in hospital BP 113/41. The diastolic pressure has been especially low ranging from 50-59. We have stopped the HCZT. Please monitor BP on discharge.   3. Follow up with podiatry 4. Monitor CBGs daily   Significant Procedures:  Partial lateral foot amputation of left foot on 12/06/18  Significant Labs and Imaging:  Recent Labs  Lab 12/10/18 0341 12/11/18 0437 12/11/18 1727  WBC 11.3* 11.6* 10.6*  HGB 9.5* 9.8* 9.8*  HCT 30.0* 31.3* 30.8*  PLT 480* 482* 503*   Recent Labs  Lab 12/07/18 0409 12/08/18 0306 12/09/18 0454 12/11/18 0437 12/11/18 1727 12/12/18 0343  NA 136 137 138  --  136 136  K 4.8 4.3 4.0  --  4.1 3.9  CL 101 103 102  --  103 104  CO2 27 25 26   --  22 24  GLUCOSE 245* 170* 123*  --  229* 170*  BUN 18 22 21   --  18 15  CREATININE 0.85 0.76 0.67 0.75 0.89 0.66  CALCIUM 8.9 9.4 9.1  --  8.9 8.9     Results/Tests Pending at Time of Discharge:  Bone biopsies were taken to determine  osteomyelitis versus charcot.   Discharge Medications:  Allergies as of 12/12/2018      Reactions   Eggs Or Egg-derived Products Swelling, Other (See Comments)   Angioedema   Atorvastatin Other (See Comments)   Liver toxicity   Flu Virus Vaccine Swelling   Arm swelled (site of injection)   Latex Itching   Pravastatin Itching, Rash   Tape Rash, Other (See Comments)   TAPE PULLS OFF THE SKIN!!! Please use an alternative!!      Medication List    STOP taking these medications   amoxicillin-clavulanate 875-125 MG tablet Commonly known as: AUGMENTIN   hydrochlorothiazide 12.5 MG tablet Commonly known as: HYDRODIURIL Replaced by: hydrochlorothiazide 10 mg/mL Susp     TAKE these medications   acetaminophen 500 MG tablet Commonly known as: TYLENOL Take 2 tablets (1,000 mg total) by mouth every 6 (six) hours as needed for mild pain. Maximum of 3,000 mg per day   Albuterol Sulfate 108 (90 Base) MCG/ACT Aepb Commonly known as: ProAir RespiClick Inhale 1-2 puffs into the lungs every 6 (six) hours as needed (Shortness of breath or wheezing).   aspirin EC 325 MG tablet Take 1 tablet (325 mg total) by mouth daily.   Contour Next Monitor w/Device Kit as directed.   diclofenac sodium 1 % Gel Commonly known as: VOLTAREN Apply 2 g topically 4 (four) times daily. Rub into affected area of foot 2 to 4 times daily   DULoxetine 60 MG capsule Commonly known as: CYMBALTA Take 60 mg by mouth at bedtime.   fenofibrate 145 MG tablet Commonly known as: TRICOR Take 145 mg by mouth daily.   gabapentin 400 MG capsule Commonly known as: NEURONTIN Take 800 mg by mouth 3 (three) times daily.   glimepiride 2 MG tablet Commonly known as: AMARYL Take 2 mg by mouth 2 (two) times daily.   hydrochlorothiazide 10 mg/mL Susp Take 0.63 mLs (6.25 mg total) by mouth daily. Start taking on: December 13, 2018 Replaces: hydrochlorothiazide 12.5 MG tablet   metFORMIN 500 MG 24 hr tablet Commonly known  as: GLUCOPHAGE-XR Take 1,500 mg by mouth daily.   oxyCODONE-acetaminophen 7.5-325 MG tablet Commonly known as: PERCOCET Take 1 tablet by mouth every 8 (eight) hours as needed for up to 7 days for severe pain.   pantoprazole 40 MG tablet Commonly known as: PROTONIX Take 1 tablet (40 mg total) by mouth daily.   Tyler Aas FlexTouch 200 UNIT/ML Sopn Generic drug: Insulin Degludec Inject 20 Units into the skin at bedtime.   vancomycin  IVPB Inject 1,500 mg into the vein daily. Indication:  Bacteremia Last Day of Therapy:  01/14/19 Labs - Sunday/Monday:  CBC/D, BMP, and vancomycin trough. Labs - Thursday:  BMP and vancomycin trough Labs - Every other week:  ESR and CRP   vancomycin 1,500 mg in sodium chloride 0.9 % 500 mL Inject 1,500 mg into the vein daily. Start taking on: December 13, 2018            Home Infusion Instuctions  (From admission, onward)         Start  Ordered   12/12/18 0000  Home infusion instructions [facility] May follow [facility] Dosing Protocol; May administer Cathflo as needed to maintain patency of vascular access device.; [place] of vascular access device: per [facility] Protocol: 0.9% NaCl pre/post medica...    Question Answer Comment  Instructions May follow [facility] [facility] Dosing Protocol   Instructions May administer Cathflo as needed to maintain patency of vascular access device.   Instructions Flushing of vascular access device: per [facility] Protocol: 0.9% NaCl pre/post medication administration and prn patency; Heparin 100 u/ml, 21m for implanted ports and Heparin 10u/ml, 510mfor all other central venous catheters.   Instructions May follow [facility] Anaphylaxis Protocol for First Dose Administration in the home: 0.9% NaCl at 25-50 ml/hr to maintain IV access for protocol meds. Epinephrine 0.3 ml IV/IM PRN and Benadryl 25-50 IV/IM PRN s/s of anaphylaxis.   Instructions [facility] Infusion Coordinator (RN) to assist per patient IV care needs in the home  PRN.      12/12/18 1359          Discharge Instructions: Please refer to Patient Instructions section of EMR for full details.  Patient was counseled important signs and symptoms that should prompt return to medical care, changes in medications, dietary instructions, activity restrictions, and follow up appointments.   Follow-Up Appointments: Contact information for after-discharge care    Destination    [facility] Preferred SNF .   Service: Skilled Nursing Contact information: [phone]          Podiatry  Infectious Disease  WeLurline DelMD 12/12/2018, 4:56 PM PGY-3, [place]

## 2018-12-12 ENCOUNTER — Telehealth: Payer: Self-pay | Admitting: *Deleted

## 2018-12-12 DIAGNOSIS — L97522 Non-pressure chronic ulcer of other part of left foot with fat layer exposed: Secondary | ICD-10-CM | POA: Diagnosis not present

## 2018-12-12 DIAGNOSIS — M86172 Other acute osteomyelitis, left ankle and foot: Secondary | ICD-10-CM | POA: Diagnosis not present

## 2018-12-12 DIAGNOSIS — R2689 Other abnormalities of gait and mobility: Secondary | ICD-10-CM | POA: Diagnosis not present

## 2018-12-12 DIAGNOSIS — Z89432 Acquired absence of left foot: Secondary | ICD-10-CM | POA: Diagnosis not present

## 2018-12-12 DIAGNOSIS — M6281 Muscle weakness (generalized): Secondary | ICD-10-CM | POA: Diagnosis not present

## 2018-12-12 DIAGNOSIS — R7881 Bacteremia: Secondary | ICD-10-CM | POA: Diagnosis not present

## 2018-12-12 DIAGNOSIS — E11621 Type 2 diabetes mellitus with foot ulcer: Secondary | ICD-10-CM | POA: Diagnosis not present

## 2018-12-12 DIAGNOSIS — A419 Sepsis, unspecified organism: Secondary | ICD-10-CM | POA: Diagnosis not present

## 2018-12-12 DIAGNOSIS — E1142 Type 2 diabetes mellitus with diabetic polyneuropathy: Secondary | ICD-10-CM | POA: Diagnosis not present

## 2018-12-12 DIAGNOSIS — M869 Osteomyelitis, unspecified: Secondary | ICD-10-CM | POA: Diagnosis not present

## 2018-12-12 DIAGNOSIS — Z09 Encounter for follow-up examination after completed treatment for conditions other than malignant neoplasm: Secondary | ICD-10-CM | POA: Diagnosis not present

## 2018-12-12 DIAGNOSIS — B9562 Methicillin resistant Staphylococcus aureus infection as the cause of diseases classified elsewhere: Secondary | ICD-10-CM | POA: Diagnosis not present

## 2018-12-12 DIAGNOSIS — D649 Anemia, unspecified: Secondary | ICD-10-CM | POA: Diagnosis not present

## 2018-12-12 DIAGNOSIS — Z4781 Encounter for orthopedic aftercare following surgical amputation: Secondary | ICD-10-CM | POA: Diagnosis not present

## 2018-12-12 DIAGNOSIS — F3289 Other specified depressive episodes: Secondary | ICD-10-CM | POA: Diagnosis not present

## 2018-12-12 LAB — BASIC METABOLIC PANEL
Anion gap: 8 (ref 5–15)
BUN: 15 mg/dL (ref 8–23)
CO2: 24 mmol/L (ref 22–32)
Calcium: 8.9 mg/dL (ref 8.9–10.3)
Chloride: 104 mmol/L (ref 98–111)
Creatinine, Ser: 0.66 mg/dL (ref 0.44–1.00)
GFR calc Af Amer: >60 mL/min (ref >60)
GFR calc non Af Amer: >60 mL/min (ref >60)
Glucose, Bld: 170 mg/dL — ABNORMAL HIGH (ref 70–99)
Potassium: 3.9 mmol/L (ref 3.5–5.1)
Sodium: 136 mmol/L (ref 135–145)

## 2018-12-12 LAB — AEROBIC/ANAEROBIC CULTURE W GRAM STAIN (SURGICAL/DEEP WOUND)

## 2018-12-12 LAB — GLUCOSE, CAPILLARY
Glucose-Capillary: 140 mg/dL — ABNORMAL HIGH (ref 70–99)
Glucose-Capillary: 257 mg/dL — ABNORMAL HIGH (ref 70–99)
Glucose-Capillary: 124 mg/dL — ABNORMAL HIGH (ref 70–99)

## 2018-12-12 LAB — VANCOMYCIN, TROUGH: Vancomycin Tr: 13 ug/mL — ABNORMAL LOW (ref 15–20)

## 2018-12-12 MED ORDER — VANCOMYCIN IV (FOR PTA / DISCHARGE USE ONLY): 1500.0000 mg | INTRAVENOUS | 0 refills | Status: DC

## 2018-12-12 MED ORDER — VANCOMYCIN HCL 10 G IV SOLR: 1500.0000 mg | INTRAVENOUS | Status: DC

## 2018-12-12 MED ORDER — HYDROCHLOROTHIAZIDE 10 MG/ML ORAL SUSPENSION: 6.2500 mg | Freq: Every day | ORAL | 0 refills | Status: DC

## 2018-12-12 MED ORDER — OXYCODONE-ACETAMINOPHEN 7.5-325 MG PO TABS: 1.0000 | ORAL_TABLET | Freq: Three times a day (TID) | ORAL | 0 refills | Status: DC | PRN

## 2018-12-12 MED ORDER — OXYCODONE-ACETAMINOPHEN 7.5-325 MG PO TABS: 1.0000 | ORAL_TABLET | Freq: Three times a day (TID) | ORAL | 0 refills | Status: AC | PRN

## 2018-12-12 NOTE — Progress Notes (Signed)
Patient has just left via [facility] to [facility]. All belongings has been sent with patient and room was double checked. Daughter in law Aniceto Boss called at [phone] to give an update on patients discharge. No answer. Left voicemail with call back number if there are any questions.

## 2018-12-12 NOTE — Plan of Care (Signed)
  Problem: Education: Goal: Knowledge of General Education information will improve Description: Including pain rating scale, medication(s)/side effects and non-pharmacologic comfort measures Outcome: Progressing   Problem: Clinical Measurements: Goal: Will remain free from infection Outcome: Progressing Goal: Diagnostic test results will improve Outcome: Progressing   Problem: Activity: Goal: Risk for activity intolerance will decrease Outcome: Progressing   Problem: Nutrition: Goal: Adequate nutrition will be maintained Outcome: Progressing   Problem: Safety: Goal: Ability to remain free from injury will improve Outcome: Progressing   Problem: Skin Integrity: Goal: Risk for impaired skin integrity will decrease Outcome: Progressing

## 2018-12-12 NOTE — TOC Transition Note (Signed)
Transition of Care [facility]) - CM/SW Discharge Note   Patient Details  Name: Olivia Romero MRN: [id number] Date of Birth: 1955-12-21  [facility] of [facility] CM/SW Contact:  Alberteen Sam, LCSW Phone Number: 12/12/2018, 2:43 PM   Clinical Narrative:     Patient will DC to: [facility] Anticipated DC date: 12/12/2018 Family notified: n/a- patient to notify Transport [id number]  Per MD patient ready for DC to [facility] . RN, patient, patient's family, and facility notified of DC. Discharge Summary sent to facility. RN given number for report  [phone] . DC packet on chart. Ambulance transport requested for patient for 4:00 pm per nurse request.  [place] signing off.  [place], [place]   Final next level of care: Skilled Nursing Facility Barriers to Discharge: No Barriers Identified   Patient Goals and CMS Choice Patient states their goals for this hospitalization and ongoing recovery are:: get better CMS Medicare.gov Compare Post Acute Care list provided to:: Patient Choice offered to / list presented to : Patient  Discharge Placement PASRR number recieved: 12/11/18            Patient [facility] bed at: [facility] Patient to be transferred to facility by: [place] Name of family member notified: n/a- patient to notify Patient and family notified of of transfer: 12/12/18  Discharge Plan and Services   Discharge Planning Services: CM Consult Post Acute Care Choice: [facility]          DME Arranged: Access ramp, N/A         HH Arranged: NA          Social Determinants of Health (SDOH) Interventions     Readmission Risk Interventions No flowsheet data found.

## 2018-12-12 NOTE — Progress Notes (Signed)
Physical Therapy Treatment Patient Details Name: Olivia Romero MRN: [id number] DOB: 1955/06/10 Today's Date: 12/12/2018    History of Present Illness Pt is a 63 y.o. female admitted 12/04/18 with sepsis secondary to diabetic L foot ulcer. S/p partial L foot amputation and I&D on 7/3. PMH includes DM2, charcot foot, peripheral neuropathy, HTN, asthma.    PT Comments    Pt was seen for mobility including transfers, gait training and there ex to add to her previous routine.  Pt is very compliant with instructions for NWB on LLE, very good recall and return on training with bed to chair transfers and is beginning to be ambulatory with help.  Follow up in SNF expected today, and will see her again if she is held up for DC to continue gait progression.     Follow Up Recommendations  SNF;Supervision for mobility/OOB     Equipment Recommendations  3in1 (PT)    Recommendations for Other Services Rehab consult     Precautions / Restrictions Precautions Precautions: Fall Restrictions Weight Bearing Restrictions: Yes LLE Weight Bearing: Non weight bearing Other Position/Activity Restrictions: Per Podiatry MD note 7/4    Mobility  Bed Mobility Overal bed mobility: Modified Independent                Transfers Overall transfer level: Needs assistance Equipment used: Rolling walker (2 wheeled) Transfers: Sit to/from [facility] Sit to Stand: Min guard;Min assist Stand pivot transfers: Min assist       General transfer comment: requires help with RW and controlling BSC  Ambulation/Gait Ambulation/Gait assistance: Min assist Gait Distance (Feet): 8 Feet Assistive device: Rolling walker (2 wheeled)   Gait velocity: Decreased Gait velocity interpretation: <1.8 ft/sec, indicate of risk for recurrent falls General Gait Details: hopping side of bed with help to maneuver walker   Stairs             Wheelchair Mobility    Modified Rankin (Stroke  Patients Only)       Balance   Sitting-balance support: Feet supported Sitting balance-Leahy Scale: Good     Standing balance support: Bilateral upper extremity supported;During functional activity Standing balance-Leahy Scale: Poor                              Cognition Arousal/Alertness: Awake/alert Behavior During Therapy: WFL for tasks assessed/performed Overall Cognitive Status: Within Functional Limits for tasks assessed                                        Exercises General Exercises - Upper Extremity Shoulder Horizontal ABduction: Strengthening;Seated;Theraband;15 reps Theraband Level (Shoulder Horizontal Abduction): Level 2 (Red) Elbow Flexion: Strengthening;Both;Seated;Theraband;15 reps Theraband Level (Elbow Flexion): Level 2 (Red) Elbow Extension: Strengthening;Both;Seated;Theraband;15 reps Theraband Level (Elbow Extension): Level 2 (Red) General Exercises - Lower Extremity Quad Sets: AROM;Both;10 reps Gluteal Sets: AROM;Both;10 reps Hip ABduction/ADduction: AROM;Both;10 reps;Supine Straight Leg Raises: AROM;Both;10 reps    General Comments General comments (skin integrity, edema, etc.): pt is asking to use BSC, was able to transition there with min guard to min assist with RW.  Maintenance of NWB is very good, not having to remind her even when moving on the bed.      Pertinent Vitals/Pain Pain Assessment: No/denies pain    Home Living  Prior Function            PT Goals (current goals can now be found in the care plan section) Acute Rehab PT Goals Patient Stated Goal: to get stronger Progress towards PT goals: Progressing toward goals    Frequency    Min 3X/week      PT Plan Current plan remains appropriate    Co-evaluation              AM-PAC PT "6 Clicks" Mobility   Outcome Measure  Help needed turning from your back to your side while in a flat bed without using  bedrails?: None Help needed moving from lying on your back to sitting on the side of a flat bed without using bedrails?: None Help needed moving to and from a bed to a chair (including a wheelchair)?: A Little Help needed standing up from a chair using your arms (e.g., wheelchair or bedside chair)?: A Little Help needed to walk in hospital room?: A Little Help needed climbing 3-5 steps with a railing? : Total 6 Click Score: 18    End of Session Equipment Utilized During Treatment: Gait belt Activity Tolerance: Patient tolerated treatment well Patient left: in bed;with call bell/phone within reach Nurse Communication: Mobility status PT Visit Diagnosis: Difficulty in walking, not elsewhere classified (R26.2);Unsteadiness on feet (R26.81)     Time: 2620-3559 PT Time Calculation (min) (ACUTE ONLY): 31 min  Charges:  $Gait Training: 8-22 mins $Therapeutic Exercise: 8-22 mins                    Ramond Dial 12/12/2018, 5:51 PM  Mee Hives, PT MS Acute Rehab Dept. Number: [place]

## 2018-12-12 NOTE — Progress Notes (Signed)
Pharmacy Antibiotic Note  Olivia Romero is a 63 y.o. female admitted on 12/04/2018 with diabetic foot infection and MRSA bacteremia. Pharmacy has been consulted for Vancomycin dosing.  Vancomycin levels 7/6 resulted in calculated AUC of 311, dose increased to 1500mg  IV every 24h for new estimated AUC 466  Vancomycin Trough 7/9 of 13:  Not supratherapeutic, no AUC calc without peak.  Renal function stable  Plan: Continue vancomycin 1500mg  IV every 24 hours Monitor renal function Vancomycin peak + trough for another 2 level AUC check if remains inpatient Plan to treat through 01/14/2019 per ID recs, OPAT orders pended 7/6 for discharge    Height: 5' (152.4 cm) Weight: 191 lb 12.8 oz (87 kg) IBW/kg (Calculated) : 45.5  Temp (24hrs), Avg:98.5 F (36.9 C), Min:98 F (36.7 C), Max:98.9 F (37.2 C)  Recent Labs  Lab 12/08/18 0306 12/08/18 1642 12/08/18 2310 12/09/18 0454 12/10/18 0341 12/11/18 0437 12/11/18 1727 12/11/18 2316 12/12/18 0343  WBC 12.8*  --   --  11.0* 11.3* 11.6* 10.6*  --   --   CREATININE 0.76  --   --  0.67  --  0.75 0.89  --  0.66  VANCOTROUGH  --  8*  --   --   --   --   --  13*  --   VANCOPEAK  --   --  18*  --   --   --   --   --   --     Estimated Creatinine Clearance: 72.4 mL/min (by C-G formula based on SCr of 0.66 mg/dL).    Allergies  Allergen Reactions  . Eggs Or Egg-Derived Products Swelling and Other (See Comments)    Angioedema  . Atorvastatin Other (See Comments)    Liver toxicity  . Flu Virus Vaccine Swelling    Arm swelled (site of injection)  . Latex Itching  . Pravastatin Itching and Rash  . Tape Rash and Other (See Comments)    TAPE PULLS OFF THE SKIN!!! Please use an alternative!!   Bertis Ruddy, PharmD Clinical Pharmacist Please check AMION for all [place] numbers 12/12/2018 1:39 PM

## 2018-12-12 NOTE — Telephone Encounter (Signed)
Patient is in hospital and I called to see how she was doing and patient stated that she was suppose to go home yesterday but has yet to go home and patient that she was doing fine and that Dr [name] called her yesterday and I stated to call if any concerns or questions at [phone]. Olivia Romero

## 2018-12-12 NOTE — Telephone Encounter (Signed)
Error. Olivia Romero

## 2018-12-12 NOTE — Progress Notes (Signed)
Occupational Therapy Treatment Patient Details Name: Olivia Romero MRN: [id number] DOB: 03/29/1956 Today's Date: 12/12/2018    History of present illness Pt is a 63 y.o. female admitted 12/04/18 with sepsis secondary to diabetic L foot ulcer. S/p partial L foot amputation and I&D on 7/3. PMH includes DM2, charcot foot, peripheral neuropathy, HTN, asthma.   OT comments  Focus of today's session on UE strengthening. Pt very eager to participate in therapy and reports she is enjoying her theraband exercises.  Pt able to recall 2/3 UE exercises from previous session.  Pt completed all 3 exercises sitting EOB.  She is mod I for bed mobility and required min guard to stand in order for therapist to reposition bed pads.  She is likely discharging to SNF later today, and she is very optimistic about her recovery. Will continue to follow acutely.  Follow Up Recommendations  CIR;SNF;Supervision/Assistance - 24 hour    Equipment Recommendations  3 in 1 bedside commode    Recommendations for Other Services      Precautions / Restrictions Precautions Precautions: Fall Restrictions Weight Bearing Restrictions: Yes LLE Weight Bearing: Non weight bearing       Mobility Bed Mobility Overal bed mobility: Modified Independent                Transfers Overall transfer level: Needs assistance Equipment used: None Transfers: Sit to/from Stand Sit to Stand: Min guard         General transfer comment: Sit <>stand at bed in order for therapist to reposition pad in bed.     Balance                                           ADL either performed or assessed with clinical judgement   ADL                                               Vision       Perception     Praxis      Cognition Arousal/Alertness: Awake/alert Behavior During Therapy: WFL for tasks assessed/performed Overall Cognitive Status: Within Functional Limits for tasks  assessed                                          Exercises Exercises: General Upper Extremity General Exercises - Upper Extremity Shoulder Horizontal ABduction: Strengthening;Seated;Theraband;15 reps Theraband Level (Shoulder Horizontal Abduction): Level 2 (Red) Elbow Flexion: Strengthening;Both;Seated;Theraband;15 reps Theraband Level (Elbow Flexion): Level 2 (Red) Elbow Extension: Strengthening;Both;Seated;Theraband;15 reps Theraband Level (Elbow Extension): Level 2 (Red)   Shoulder Instructions       General Comments Independently recalled shoulder abduction and elbow flexion theraband exercises, therapist modeling other exercise from previous session (elbow extension) and pt able to return demonstration. Min cues/modeling for technique for all exercises.    Pertinent Vitals/ Pain       Pain Assessment: No/denies pain  Home Living                                          Prior Functioning/Environment  Frequency  Min 2X/week        Progress Toward Goals  OT Goals(current goals can now be found in the care plan section)  Progress towards OT goals: Progressing toward goals  Acute Rehab OT Goals Patient Stated Goal: wants to be safe OT Goal Formulation: With patient Time For Goal Achievement: 12/21/18 Potential to Achieve Goals: Fair ADL Goals Pt Will Transfer to Toilet: with supervision;ambulating;bedside commode Pt Will Perform Toileting - Clothing Manipulation and hygiene: with modified independence;sit to/from stand Pt Will Perform Tub/Shower Transfer: with supervision;Tub transfer;tub bench;rolling walker  Plan Discharge plan remains appropriate;Frequency remains appropriate    Co-evaluation                 AM-PAC OT "6 Clicks" Daily Activity     Outcome Measure   Help from another person eating meals?: None Help from another person taking care of personal grooming?: A Little(standing) Help from  another person toileting, which includes using toliet, bedpan, or urinal?: A Lot Help from another person bathing (including washing, rinsing, drying)?: A Little Help from another person to put on and taking off regular upper body clothing?: None Help from another person to put on and taking off regular lower body clothing?: A Little 6 Click Score: 19    End of Session Equipment Utilized During Treatment: (theraband)  OT Visit Diagnosis: Unsteadiness on feet (R26.81);Pain;History of falling (Z91.81)   Activity Tolerance Patient tolerated treatment well   Patient Left in bed;with call bell/phone within reach   Nurse Communication          Time: 1355-1410 OT Time Calculation (min): 15 min  Charges: OT General Charges $OT Visit: 1 Visit OT Treatments $Therapeutic Exercise: 8-22 mins    Darrol Jump OTR/L [place] [phone] 12/12/2018, 2:37 PM

## 2018-12-12 NOTE — Progress Notes (Addendum)
Family Medicine Teaching Service Daily Progress Note Intern Pager: [phone]  Patient name: Olivia Romero Medical record number: [id number] Date of birth: 11-25-1955 Age: 63 y.o. Gender: female  Primary Care Provider: Arnetha Courser, MD Consultants: ID, podiatry Code Status: Full  Pt Overview and Major Events to Date:  7/1-admission for sepsis secondary to osteomyelitis, bacteremia 7/3-partial foot amputation with irrigation and debridement of left foot with bone biopsy  Antibiotics: Vancomycin (7/1- ) Zosyn (7/1-7/2)  Assessment and Plan: Olivia Romero is a 63 y.o. female presenting with sepsis. PMH is significant for poorly controlled T2DM, charcot foot, significant peripheral neuropathy, HTN, GERD, asthma   Left foot osteomyelitis, status post ray amputation-stable Status post ray amp on 7/3.   -Strict nonweightbearing left lower extremity for 4 weeks. (Not a candidate for inpatient rehab until weightbearing) -Awaiting discharge to SNF then will transition to rehab afterwards.  -Continue long-term vancomycin, PICC line  placed 06/07, follow up ID clinic on 01/14/19 -Follow-up podiatry rec: POV for 7/13 with Dr Jacqualyn Posey -Follow-up surgical culture, reaccumulated better growth -Tylenol PRN  MRSA bacteremia ID following. 1 out of 2 initial or MRSA.  Repeat blood cultures negative at 2 days.  Per ID recs  IV vancomycin likely long-term.  Echo without any abnormality, no indication for TEE per ID.  Anemia-stable Hemoglobin 9.8. Admission hemoglobin 11.2, baseline around 11.  Likely secondary to blood loss anemia in setting of surgery. -monitor Hb, and order iron studies on discharge if still anemic.  -Follow up anemia on an OPD basis with PCP  Diabetes CBG 164.  Elevated blood sugars likely acute phase reactant secondary to stress from surgery.  Sugars with much better control since starting Lantus.  No adjustment to medications needed at this point. -Lantus 8 mg  daily -SSI moderate -At bedtime correction -Monitor CBGs  Hypertension-controlled Home medication includes HCTZ.  Blood pressure has been labile. BP143/60.  -Stopped HCTZ  Charcot foot, neuropathy -Duloxetine 60 mg daily -Gabapentin 800 mg 3 times daily  Disposition Awaiting placement to SNF  FEN/GI: Carb modified PPx: Heparin  Disposition: awaiting discharge to SNF. Medically stable for discharge.  Subjective:  Sat up in bed talking. Feels well. Denies chest pain, dyspnea, nausea or vomiting. No new concerns overnight. Looking forward to going to SNF. I counseled her on PCP following up anema.  Objective: Temp:  [98 F (36.7 C)-98.9 F (37.2 C)] 98 F (36.7 C) (07/09 0604) Pulse Rate:  [61-67] 61 (07/09 0604) Resp:  [20] 20 (07/09 0604) BP: (114-125)/(60-74) 114/60 (07/09 0604) SpO2:  [96 %-97 %] 96 % (07/09 0604)   General: Alert and orientated, no acute distress Cardio: Normal S1 and S2, no S3 or S4. Rhythm is regular No murmurs or rubs.   Pulm: Clear to auscultation bilaterally, no crackles, wheezing, or diminished breath sounds. Normal respiratory effort Abdomen: Bowel sounds normal. Abdomen soft and non-tender.  Extremities: No peripheral edema. Warm/ well perfused.  Neuro: Cranial nerves grossly intact  Doing well with physical therapy.  Laboratory: Recent Labs  Lab 12/10/18 0341 12/11/18 0437 12/11/18 1727  WBC 11.3* 11.6* 10.6*  HGB 9.5* 9.8* 9.8*  HCT 30.0* 31.3* 30.8*  PLT 480* 482* 503*   Recent Labs  Lab 12/09/18 0454 12/11/18 0437 12/11/18 1727 12/12/18 0343  NA 138  --  136 136  K 4.0  --  4.1 3.9  CL 102  --  103 104  CO2 26  --  22 24  BUN 21  --  18 15  CREATININE 0.67 0.75 0.89 0.66  CALCIUM 9.1  --  8.9 8.9  GLUCOSE 123*  --  229* 170*    Imaging/Diagnostic Tests: No results found.  Olivia Haw, MD 12/12/2018, 6:19 AM PGY-1, [place] Intern pager: [phone], text pages welcome

## 2018-12-12 NOTE — Progress Notes (Addendum)
Patient can discharge to [facility] today, CSW attempting to reach family medicine residents to request DC summary and orders.  CSW spoke with Dr. Charlies Silvers to inform of this need. Still pending dc summary and orders.   [place], [place]

## 2018-12-12 NOTE — Progress Notes (Signed)
Talked over the phone to Aniceto Boss ([place] unit coordinator) and gave patient report.

## 2018-12-13 DIAGNOSIS — B9562 Methicillin resistant Staphylococcus aureus infection as the cause of diseases classified elsewhere: Secondary | ICD-10-CM | POA: Diagnosis not present

## 2018-12-13 DIAGNOSIS — R7881 Bacteremia: Secondary | ICD-10-CM | POA: Diagnosis not present

## 2018-12-13 DIAGNOSIS — E1142 Type 2 diabetes mellitus with diabetic polyneuropathy: Secondary | ICD-10-CM | POA: Diagnosis not present

## 2018-12-13 DIAGNOSIS — M869 Osteomyelitis, unspecified: Secondary | ICD-10-CM | POA: Diagnosis not present

## 2018-12-16 ENCOUNTER — Encounter: Payer: Self-pay | Admitting: Podiatry

## 2018-12-16 ENCOUNTER — Other Ambulatory Visit: Payer: Self-pay

## 2018-12-16 ENCOUNTER — Ambulatory Visit (INDEPENDENT_AMBULATORY_CARE_PROVIDER_SITE_OTHER): Payer: Medicare Other

## 2018-12-16 ENCOUNTER — Ambulatory Visit (INDEPENDENT_AMBULATORY_CARE_PROVIDER_SITE_OTHER): Payer: Medicare Other | Admitting: Podiatry

## 2018-12-16 VITALS — Temp 98.0°F

## 2018-12-16 DIAGNOSIS — L97522 Non-pressure chronic ulcer of other part of left foot with fat layer exposed: Secondary | ICD-10-CM

## 2018-12-16 DIAGNOSIS — Z09 Encounter for follow-up examination after completed treatment for conditions other than malignant neoplasm: Secondary | ICD-10-CM | POA: Diagnosis not present

## 2018-12-16 DIAGNOSIS — M86272 Subacute osteomyelitis, left ankle and foot: Secondary | ICD-10-CM

## 2018-12-17 DIAGNOSIS — M869 Osteomyelitis, unspecified: Secondary | ICD-10-CM | POA: Diagnosis not present

## 2018-12-17 DIAGNOSIS — Z89432 Acquired absence of left foot: Secondary | ICD-10-CM | POA: Diagnosis not present

## 2018-12-17 DIAGNOSIS — B9562 Methicillin resistant Staphylococcus aureus infection as the cause of diseases classified elsewhere: Secondary | ICD-10-CM | POA: Diagnosis not present

## 2018-12-17 DIAGNOSIS — E1142 Type 2 diabetes mellitus with diabetic polyneuropathy: Secondary | ICD-10-CM | POA: Diagnosis not present

## 2018-12-18 DIAGNOSIS — Z89432 Acquired absence of left foot: Secondary | ICD-10-CM | POA: Diagnosis not present

## 2018-12-18 DIAGNOSIS — D649 Anemia, unspecified: Secondary | ICD-10-CM | POA: Diagnosis not present

## 2018-12-18 DIAGNOSIS — M869 Osteomyelitis, unspecified: Secondary | ICD-10-CM | POA: Diagnosis not present

## 2018-12-18 NOTE — Progress Notes (Signed)
Subjective: Olivia Romero is a 63 y.o. is seen today in office s/p left foot fourth and fifth ray amputations with bone biopsy preformed on 12/06/2018 with Dr. [name].  She was discharged to rehab.  She had been doing well she is still getting antibiotics through PICC line.  She has not been putting any weight on her foot she reports however she states that the wound nurse states that she may have popped staple.  Denies any systemic complaints such as fevers, chills, nausea, vomiting. No calf pain, chest pain, shortness of breath.   Objective: General: No acute distress, AAOx3  DP/PT pulses palpable 2/4, CRT < 3 sec to all digits. [name]  LEFT foot: Incision is well coapted without any evidence of dehiscence.  On the central aspect there is a small scab where limited been some bleeding however there is no dehiscence of the incision.  There is noes significant surrounding erythema is no ascending cellulitis there is no fluctuation crepitation any drainage or pus identified today.  There is no increase in warmth of the foot.  The foot is seen in a more varus almost clublike position today.   No other areas of tenderness to bilateral lower extremities.  No other open lesions or pre-ulcerative lesions.  No pain with calf compression, swelling, warmth, erythema.   Assessment and Plan:  Status post left foot surgery, doing well with no complications   -Treatment options discussed including all alternatives, risks, and complications -X-rays were obtained and reviewed.  Status post orthopedic amputations.  No evidence of acute fracture. -Reviewed the culture of the bone biopsy with her.  Continue antibiotics as directed by infectious disease.  She is tolerating well.  Continue nonweightbearing.  Elevation. -Ultimately she is going to have a reconstruction given the deformity of her foot or a below the knee amputation. -Elevation -Pain medication as needed-pain is controlled -Monitor for any clinical signs or  symptoms of infection and DVT/PE and directed to call the office immediately should any occur or go to the ER. -Follow-up as scheduled or sooner if any problems arise. In the meantime, encouraged to call the office with any questions, concerns, change in symptoms.   Celesta Gentile, DPM

## 2018-12-19 ENCOUNTER — Telehealth: Payer: Self-pay | Admitting: Podiatry

## 2018-12-19 NOTE — Telephone Encounter (Signed)
Needs signed paperwork faxed back today for DME.

## 2018-12-23 NOTE — Telephone Encounter (Signed)
Apparently we keep getting faxes for a company that is tying to sell her a brace. I talked to [place] about this. I didn't complete the paperwork.

## 2018-12-24 ENCOUNTER — Other Ambulatory Visit: Payer: Self-pay

## 2018-12-24 ENCOUNTER — Ambulatory Visit (INDEPENDENT_AMBULATORY_CARE_PROVIDER_SITE_OTHER): Payer: Self-pay | Admitting: Podiatry

## 2018-12-24 DIAGNOSIS — M86272 Subacute osteomyelitis, left ankle and foot: Secondary | ICD-10-CM

## 2018-12-25 NOTE — Progress Notes (Signed)
Subjective: Olivia Romero is a 63 y.o. is seen today in office s/p left foot fourth and fifth ray amputations with bone biopsy preformed on 12/06/2018 with Dr. [name].  She was discharged to rehab.  She states that she is having some issues with the rehab facility she will go home.  She states that she has not been putting any weight on her foot.  The dressing then changed every other day.  She states she had a temperature of 99 for a day or 2 that resolved.  She still on IV antibiotics without any side effects.  She has no new concerns today. Denies any systemic complaints such as fevers, chills, nausea, vomiting. No calf pain, chest pain, shortness of breath.   Objective: General: No acute distress, AAOx3  DP/PT pulses palpable 2/4, CRT < 3 sec to all digits. [name]  LEFT foot: Incision is well coapted without any evidence of dehiscence.  Small scab still present on the central aspect there is no active bleeding there is no drainage or pus.  Incision appears to be well coapted.  There is no erythema or warmth.  Mild swelling to the foot. Almost clublike deformity of the foot. No other open lesions or pre-ulcerative lesions.  No pain with calf compression, swelling, warmth, erythema.   Assessment and Plan:  Status post left foot surgery, doing well with no complications   -Treatment options discussed including all alternatives, risks, and complications -Incision appears to be healing well.  No active signs of infection..  Pain over the incision followed by dry sterile dressing.  We will continue with every the day dressing changes.  Continue nonweightbearing for now.  Elevation.  Continue antibiotics as directed by infectious disease.  Return in about 1 week (around 12/31/2018).  Trula Slade DPM

## 2018-12-26 ENCOUNTER — Telehealth: Payer: Self-pay | Admitting: Podiatry

## 2018-12-26 NOTE — Telephone Encounter (Signed)
The company was calling to check and see if we received an Authorization for DME approval on July 8th 2020. Please call and inform when possible.

## 2018-12-27 ENCOUNTER — Telehealth: Payer: Self-pay | Admitting: Podiatry

## 2018-12-27 NOTE — Telephone Encounter (Signed)
Pt is calling to follow up and see if the doctor has received any results from her labs. Please give patient a call.

## 2018-12-31 ENCOUNTER — Other Ambulatory Visit: Payer: Self-pay

## 2018-12-31 ENCOUNTER — Telehealth: Payer: Self-pay | Admitting: *Deleted

## 2018-12-31 ENCOUNTER — Ambulatory Visit (INDEPENDENT_AMBULATORY_CARE_PROVIDER_SITE_OTHER): Payer: Medicare Other | Admitting: Podiatry

## 2018-12-31 ENCOUNTER — Encounter: Payer: Self-pay | Admitting: Podiatry

## 2018-12-31 VITALS — Temp 97.7°F

## 2018-12-31 DIAGNOSIS — Z09 Encounter for follow-up examination after completed treatment for conditions other than malignant neoplasm: Secondary | ICD-10-CM

## 2018-12-31 DIAGNOSIS — M86272 Subacute osteomyelitis, left ankle and foot: Secondary | ICD-10-CM

## 2018-12-31 NOTE — Telephone Encounter (Signed)
Called infectious disease and the patient does have an appointment on 01/14/2019. [name]

## 2018-12-31 NOTE — Telephone Encounter (Signed)
-----   Message from Trula Slade, DPM sent at 12/31/2018  8:13 AM EDT ----- Can you call Infectious Disease to see if they have the labs they can fax over or the home health company that draws them  ----- Message ----- From: Viviana Simpler, [facility] Sent: 12/31/2018   7:57 AM EDT To: Trula Slade, DPM  Hi-patient wanted to know if there was any labs that you got back and if so patient would like a call back or I can call patient. Thanks [name]

## 2018-12-31 NOTE — Telephone Encounter (Signed)
Called and spoke with [name] and was going to locate the labs and fax over to Dr Jacqualyn Posey. [name]

## 2018-12-31 NOTE — Telephone Encounter (Signed)
-----   Message from Trula Slade, DPM sent at 12/31/2018  8:13 AM EDT ----- Can you call Infectious Disease to see if they have the labs they can fax over or the home health company that draws them  ----- Message ----- From: Viviana Simpler, [facility] Sent: 12/31/2018   7:57 AM EDT To: Trula Slade, DPM  Hi-patient wanted to know if there was any labs that you got back and if so patient would like a call back or I can call patient. Thanks [name]

## 2019-01-01 DIAGNOSIS — M869 Osteomyelitis, unspecified: Secondary | ICD-10-CM | POA: Diagnosis not present

## 2019-01-01 DIAGNOSIS — Z89432 Acquired absence of left foot: Secondary | ICD-10-CM | POA: Diagnosis not present

## 2019-01-01 DIAGNOSIS — D649 Anemia, unspecified: Secondary | ICD-10-CM | POA: Diagnosis not present

## 2019-01-02 ENCOUNTER — Telehealth: Payer: Self-pay | Admitting: *Deleted

## 2019-01-02 DIAGNOSIS — Z6836 Body mass index (BMI) 36.0-36.9, adult: Secondary | ICD-10-CM | POA: Diagnosis not present

## 2019-01-02 DIAGNOSIS — E11610 Type 2 diabetes mellitus with diabetic neuropathic arthropathy: Secondary | ICD-10-CM | POA: Diagnosis not present

## 2019-01-02 DIAGNOSIS — D649 Anemia, unspecified: Secondary | ICD-10-CM | POA: Diagnosis not present

## 2019-01-02 DIAGNOSIS — E1140 Type 2 diabetes mellitus with diabetic neuropathy, unspecified: Secondary | ICD-10-CM | POA: Diagnosis not present

## 2019-01-02 DIAGNOSIS — Z89422 Acquired absence of other left toe(s): Secondary | ICD-10-CM | POA: Diagnosis not present

## 2019-01-02 DIAGNOSIS — E1122 Type 2 diabetes mellitus with diabetic chronic kidney disease: Secondary | ICD-10-CM | POA: Diagnosis not present

## 2019-01-02 DIAGNOSIS — B9562 Methicillin resistant Staphylococcus aureus infection as the cause of diseases classified elsewhere: Secondary | ICD-10-CM | POA: Diagnosis not present

## 2019-01-02 DIAGNOSIS — Z4801 Encounter for change or removal of surgical wound dressing: Secondary | ICD-10-CM | POA: Diagnosis not present

## 2019-01-02 DIAGNOSIS — Z4781 Encounter for orthopedic aftercare following surgical amputation: Secondary | ICD-10-CM | POA: Diagnosis not present

## 2019-01-02 DIAGNOSIS — R269 Unspecified abnormalities of gait and mobility: Secondary | ICD-10-CM | POA: Diagnosis not present

## 2019-01-02 DIAGNOSIS — M6281 Muscle weakness (generalized): Secondary | ICD-10-CM | POA: Diagnosis not present

## 2019-01-02 DIAGNOSIS — J45909 Unspecified asthma, uncomplicated: Secondary | ICD-10-CM | POA: Diagnosis not present

## 2019-01-02 DIAGNOSIS — I129 Hypertensive chronic kidney disease with stage 1 through stage 4 chronic kidney disease, or unspecified chronic kidney disease: Secondary | ICD-10-CM | POA: Diagnosis not present

## 2019-01-02 DIAGNOSIS — I69391 Dysphagia following cerebral infarction: Secondary | ICD-10-CM | POA: Diagnosis not present

## 2019-01-02 DIAGNOSIS — N189 Chronic kidney disease, unspecified: Secondary | ICD-10-CM | POA: Diagnosis not present

## 2019-01-02 DIAGNOSIS — E1151 Type 2 diabetes mellitus with diabetic peripheral angiopathy without gangrene: Secondary | ICD-10-CM | POA: Diagnosis not present

## 2019-01-02 DIAGNOSIS — M48062 Spinal stenosis, lumbar region with neurogenic claudication: Secondary | ICD-10-CM | POA: Diagnosis not present

## 2019-01-02 DIAGNOSIS — R1310 Dysphagia, unspecified: Secondary | ICD-10-CM | POA: Diagnosis not present

## 2019-01-02 DIAGNOSIS — M150 Primary generalized (osteo)arthritis: Secondary | ICD-10-CM | POA: Diagnosis not present

## 2019-01-02 DIAGNOSIS — E1169 Type 2 diabetes mellitus with other specified complication: Secondary | ICD-10-CM | POA: Diagnosis not present

## 2019-01-02 DIAGNOSIS — Z8631 Personal history of diabetic foot ulcer: Secondary | ICD-10-CM | POA: Diagnosis not present

## 2019-01-02 DIAGNOSIS — M869 Osteomyelitis, unspecified: Secondary | ICD-10-CM | POA: Diagnosis not present

## 2019-01-02 NOTE — Telephone Encounter (Signed)
Called and spoke with the patient and the patient got home yesterday at 12:00 pm and the nurse came by today and will be back tomorrow for the IV antibiotic and patient stated that everything was looking good and I stated to call the office if any concerns or questions. Olivia Romero

## 2019-01-02 NOTE — Telephone Encounter (Signed)
-----   Message from Trula Slade, DPM sent at 12/31/2018  8:13 AM EDT ----- Can you call Infectious Disease to see if they have the labs they can fax over or the home health company that draws them  ----- Message ----- From: Viviana Simpler, [facility] Sent: 12/31/2018   7:57 AM EDT To: Trula Slade, DPM  Hi-patient wanted to know if there was any labs that you got back and if so patient would like a call back or I can call patient. Thanks [name]

## 2019-01-03 ENCOUNTER — Telehealth: Payer: Self-pay | Admitting: Family Medicine

## 2019-01-03 ENCOUNTER — Telehealth: Payer: Self-pay | Admitting: *Deleted

## 2019-01-03 DIAGNOSIS — Z4801 Encounter for change or removal of surgical wound dressing: Secondary | ICD-10-CM | POA: Diagnosis not present

## 2019-01-03 DIAGNOSIS — E1169 Type 2 diabetes mellitus with other specified complication: Secondary | ICD-10-CM | POA: Diagnosis not present

## 2019-01-03 DIAGNOSIS — M869 Osteomyelitis, unspecified: Secondary | ICD-10-CM | POA: Diagnosis not present

## 2019-01-03 DIAGNOSIS — Z4781 Encounter for orthopedic aftercare following surgical amputation: Secondary | ICD-10-CM | POA: Diagnosis not present

## 2019-01-03 DIAGNOSIS — B9562 Methicillin resistant Staphylococcus aureus infection as the cause of diseases classified elsewhere: Secondary | ICD-10-CM | POA: Diagnosis not present

## 2019-01-03 DIAGNOSIS — Z89422 Acquired absence of other left toe(s): Secondary | ICD-10-CM | POA: Diagnosis not present

## 2019-01-03 NOTE — Telephone Encounter (Signed)
Patient is calling to cancel appt next week. Attempted to reach the office three times. With no answer.Thank you

## 2019-01-03 NOTE — Telephone Encounter (Signed)
Patient called and stated that she fell back on her surgery foot about 20 minutes and did not have the boot on and does not think that it is swelling and I stated to get off of it and elevate and ice and if the ace bandage was getting tight take off and re-wrap and to call the on call doctor at [phone]. [name]

## 2019-01-06 DIAGNOSIS — E1169 Type 2 diabetes mellitus with other specified complication: Secondary | ICD-10-CM | POA: Diagnosis not present

## 2019-01-06 DIAGNOSIS — Z89422 Acquired absence of other left toe(s): Secondary | ICD-10-CM | POA: Diagnosis not present

## 2019-01-06 DIAGNOSIS — M869 Osteomyelitis, unspecified: Secondary | ICD-10-CM | POA: Diagnosis not present

## 2019-01-06 DIAGNOSIS — B9562 Methicillin resistant Staphylococcus aureus infection as the cause of diseases classified elsewhere: Secondary | ICD-10-CM | POA: Diagnosis not present

## 2019-01-06 DIAGNOSIS — Z4781 Encounter for orthopedic aftercare following surgical amputation: Secondary | ICD-10-CM | POA: Diagnosis not present

## 2019-01-06 DIAGNOSIS — Z4801 Encounter for change or removal of surgical wound dressing: Secondary | ICD-10-CM | POA: Diagnosis not present

## 2019-01-07 ENCOUNTER — Ambulatory Visit (INDEPENDENT_AMBULATORY_CARE_PROVIDER_SITE_OTHER): Payer: Medicare Other

## 2019-01-07 ENCOUNTER — Ambulatory Visit (INDEPENDENT_AMBULATORY_CARE_PROVIDER_SITE_OTHER): Payer: Self-pay | Admitting: Podiatry

## 2019-01-07 ENCOUNTER — Ambulatory Visit: Payer: Medicare Other | Admitting: Family Medicine

## 2019-01-07 ENCOUNTER — Encounter: Payer: Self-pay | Admitting: Podiatry

## 2019-01-07 ENCOUNTER — Other Ambulatory Visit: Payer: Self-pay

## 2019-01-07 VITALS — Temp 97.3°F

## 2019-01-07 DIAGNOSIS — M86272 Subacute osteomyelitis, left ankle and foot: Secondary | ICD-10-CM

## 2019-01-07 DIAGNOSIS — M14672 Charcot's joint, left ankle and foot: Secondary | ICD-10-CM | POA: Diagnosis not present

## 2019-01-07 NOTE — Progress Notes (Signed)
Subjective: Olivia Romero is a 63 y.o. is seen today in office s/p left foot fourth and fifth ray amputations with bone biopsy preformed on 12/06/2018 with Dr. [name].  She feels that she is doing well the swelling is improved.  She is having no pain.  She is getting discharged from rehab tomorrow.  She has been nonweightbearing.  She is tolerating IV antibiotics well. Denies any systemic complaints such as fevers, chills, nausea, vomiting. No calf pain, chest pain, shortness of breath.   Objective: General: No acute distress, AAOx3  DP/PT pulses palpable CRT < 3 sec to all digits. [name]  LEFT foot: Incision is well coapted without any evidence of dehiscence.  Edema appears to be improved.  There is no surrounding erythema, ascending cellulitis.  No fracture crepitation malodor.  No other open lesions or pre-ulcerative lesions. No other open lesions or pre-ulcerative lesions.  No pain with calf compression, swelling, warmth, erythema.   Assessment and Plan:  Status post left foot surgery, doing well with no complications   -Treatment options discussed including all alternatives, risks, and complications -Incision appears to be healing well.  No active signs of infection.  She is very upset with the rehab facility she is eager to go home tomorrow.  Discussed that she still has been nonweightbearing.  She is not sure if home health is been set up.  We have discussed with [facility] to help make sure that she has everything she needs.  They will be coming to her house.  Continue IV antibiotics as directed by infectious disease.  I will do staples intact.  As she is going home tomorrow no evidence that she is going to start to not keep her foot is elevated or try to be more active.  Discussed this with her.  Return in about 1 week   Trula Slade DPM

## 2019-01-08 DIAGNOSIS — Z4801 Encounter for change or removal of surgical wound dressing: Secondary | ICD-10-CM | POA: Diagnosis not present

## 2019-01-08 DIAGNOSIS — Z89422 Acquired absence of other left toe(s): Secondary | ICD-10-CM | POA: Diagnosis not present

## 2019-01-08 DIAGNOSIS — M869 Osteomyelitis, unspecified: Secondary | ICD-10-CM | POA: Diagnosis not present

## 2019-01-08 DIAGNOSIS — E1169 Type 2 diabetes mellitus with other specified complication: Secondary | ICD-10-CM | POA: Diagnosis not present

## 2019-01-08 DIAGNOSIS — B9562 Methicillin resistant Staphylococcus aureus infection as the cause of diseases classified elsewhere: Secondary | ICD-10-CM | POA: Diagnosis not present

## 2019-01-08 DIAGNOSIS — Z4781 Encounter for orthopedic aftercare following surgical amputation: Secondary | ICD-10-CM | POA: Diagnosis not present

## 2019-01-10 DIAGNOSIS — E1169 Type 2 diabetes mellitus with other specified complication: Secondary | ICD-10-CM | POA: Diagnosis not present

## 2019-01-10 DIAGNOSIS — M869 Osteomyelitis, unspecified: Secondary | ICD-10-CM | POA: Diagnosis not present

## 2019-01-10 DIAGNOSIS — Z89422 Acquired absence of other left toe(s): Secondary | ICD-10-CM | POA: Diagnosis not present

## 2019-01-10 DIAGNOSIS — B9562 Methicillin resistant Staphylococcus aureus infection as the cause of diseases classified elsewhere: Secondary | ICD-10-CM | POA: Diagnosis not present

## 2019-01-10 DIAGNOSIS — Z4801 Encounter for change or removal of surgical wound dressing: Secondary | ICD-10-CM | POA: Diagnosis not present

## 2019-01-10 DIAGNOSIS — Z4781 Encounter for orthopedic aftercare following surgical amputation: Secondary | ICD-10-CM | POA: Diagnosis not present

## 2019-01-13 DIAGNOSIS — Z4801 Encounter for change or removal of surgical wound dressing: Secondary | ICD-10-CM | POA: Diagnosis not present

## 2019-01-13 DIAGNOSIS — Z4781 Encounter for orthopedic aftercare following surgical amputation: Secondary | ICD-10-CM | POA: Diagnosis not present

## 2019-01-13 DIAGNOSIS — B9562 Methicillin resistant Staphylococcus aureus infection as the cause of diseases classified elsewhere: Secondary | ICD-10-CM | POA: Diagnosis not present

## 2019-01-13 DIAGNOSIS — E1169 Type 2 diabetes mellitus with other specified complication: Secondary | ICD-10-CM | POA: Diagnosis not present

## 2019-01-13 DIAGNOSIS — Z89422 Acquired absence of other left toe(s): Secondary | ICD-10-CM | POA: Diagnosis not present

## 2019-01-13 DIAGNOSIS — M869 Osteomyelitis, unspecified: Secondary | ICD-10-CM | POA: Diagnosis not present

## 2019-01-14 ENCOUNTER — Telehealth: Payer: Self-pay | Admitting: Pharmacist

## 2019-01-14 ENCOUNTER — Encounter: Payer: Self-pay | Admitting: Internal Medicine

## 2019-01-14 ENCOUNTER — Ambulatory Visit (INDEPENDENT_AMBULATORY_CARE_PROVIDER_SITE_OTHER): Payer: Self-pay | Admitting: Podiatry

## 2019-01-14 ENCOUNTER — Ambulatory Visit (INDEPENDENT_AMBULATORY_CARE_PROVIDER_SITE_OTHER): Payer: Medicare Other | Admitting: Internal Medicine

## 2019-01-14 ENCOUNTER — Other Ambulatory Visit: Payer: Self-pay

## 2019-01-14 DIAGNOSIS — M86172 Other acute osteomyelitis, left ankle and foot: Secondary | ICD-10-CM

## 2019-01-14 DIAGNOSIS — M86272 Subacute osteomyelitis, left ankle and foot: Secondary | ICD-10-CM

## 2019-01-14 DIAGNOSIS — M14672 Charcot's joint, left ankle and foot: Secondary | ICD-10-CM

## 2019-01-14 NOTE — Telephone Encounter (Signed)
Called and spoke to [place] at [employer]. Gave verbal order to stop patient's vancomycin and pull her PICC today.  Also asked that her last set of labs be faxed to me at [phone] and her last inflammatory markers as well. Tiffany verbalized understanding.

## 2019-01-14 NOTE — Progress Notes (Signed)
[facility] for Infectious Disease  Patient Active Problem List   Diagnosis Date Noted   Osteomyelitis of ankle or foot, acute, left ([place]) 12/05/2018    Priority: High   MRSA bacteremia 12/05/2018    Priority: High   Charcot's joint of foot, left    Post-operative state    PAD (peripheral artery disease) ([place]) 12/05/2018   Sepsis without acute organ dysfunction ([place])    Charcot's joint of foot due to diabetes ([place]) 09/18/2018   BMI 36.0-36.9,adult    Morbid obesity ([place]) 11/19/2017   B12 deficiency 11/01/2017   Diabetic foot ulcer ([place]) 12/14/2016   Diabetic polyneuropathy associated with type 2 diabetes mellitus ([place]) 12/13/2015   Lumbar stenosis with neurogenic claudication 09/02/2015   Chronic constipation 07/13/2015   OSA (obstructive sleep apnea) 06/25/2015   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/23/2015   Hypertriglyceridemia 04/23/2015   Asthma, mild intermittent 02/01/2015   Type 2 diabetes mellitus with diabetic nephropathy ([place]) 12/31/2014   Gastroesophageal reflux disease with esophagitis 12/31/2014   Dysphagia 12/31/2014   Bilateral carpal tunnel syndrome 12/01/2014   Diverticulosis of colon (without mention of hemorrhage) 11/30/2012   Essential hypertension, benign 11/28/2012    Patient's Medications  New Prescriptions   No medications on file  Previous Medications   ACETAMINOPHEN (TYLENOL) 500 MG TABLET    Take 2 tablets (1,000 mg total) by mouth every 6 (six) hours as needed for mild pain. Maximum of 3,000 mg per day   ALBUTEROL SULFATE (PROAIR RESPICLICK) 443 (90 BASE) MCG/ACT AEPB    Inhale 1-2 puffs into the lungs every 6 (six) hours as needed (Shortness of breath or wheezing).   ASPIRIN EC 325 MG TABLET    Take 1 tablet (325 mg total) by mouth daily.   BLOOD GLUCOSE MONITORING SUPPL (CONTOUR NEXT MONITOR) W/DEVICE KIT    as directed.    DICLOFENAC SODIUM (VOLTAREN) 1 % GEL    Apply 2 g topically 4  (four) times daily. Rub into affected area of foot 2 to 4 times daily   DULOXETINE (CYMBALTA) 60 MG CAPSULE    Take 60 mg by mouth at bedtime.   FENOFIBRATE (TRICOR) 145 MG TABLET    Take 145 mg by mouth daily.   GABAPENTIN (NEURONTIN) 400 MG CAPSULE    Take 800 mg by mouth 3 (three) times daily.    GLIMEPIRIDE (AMARYL) 2 MG TABLET    Take 2 mg by mouth 2 (two) times daily.    HYDROCHLOROTHIAZIDE 10 MG/ML SUSP    Take 0.63 mLs (6.25 mg total) by mouth daily.   INSULIN DEGLUDEC (TRESIBA FLEXTOUCH) 200 UNIT/ML SOPN    Inject 20 Units into the skin at bedtime.    METFORMIN (GLUCOPHAGE-XR) 500 MG 24 HR TABLET    Take 1,500 mg by mouth daily.   PANTOPRAZOLE (PROTONIX) 40 MG TABLET    Take 1 tablet (40 mg total) by mouth daily.   VANCOMYCIN 1,500 MG IN SODIUM CHLORIDE 0.9 % 500 ML    Inject 1,500 mg into the vein daily.  Modified Medications   No medications on file  Discontinued Medications   VANCOMYCIN IVPB    Inject 1,500 mg into the vein daily. Indication:  Bacteremia Last Day of Therapy:  01/14/19 Labs - Sunday/Monday:  CBC/D, BMP, and vancomycin trough. Labs - Thursday:  BMP and vancomycin trough Labs - Every other week:  ESR and CRP    Subjective: [name] is in for her hospital follow-up  visit.  She was hospitalized in early July with MRSA bacteremia complicating a left diabetic foot infection. 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  No organisms were seen on operative Gram stain and operative cultures grew MRSA.  No vegetations were seen on TTE.  Repeat blood cultures became negative quickly.  I did not feel she needed a TEE since she already had an indication for prolonged IV vancomycin.  She is now completed 6 weeks of therapy.  She has had no problems tolerating her pick or vancomycin.  She feels like her left foot is doing much better.  She has not had any fever, nausea, vomiting or diarrhea.  Review of Systems: Review of  Systems  Constitutional: Negative for fever.  Gastrointestinal: Negative for abdominal pain, diarrhea, nausea and vomiting.  Musculoskeletal: Positive for joint pain.    Past Medical History:  Diagnosis Date   Anemia    Arthritis    Asthma    Carpal tunnel syndrome on right    Colon polyps    adenomatous   Diabetes mellitus without complication (HCC)    Diabetic infection of left foot ([place]) 12/2018   Diverticulosis of colon    Esophagitis    GERD (gastroesophageal reflux disease)    Hemorrhoid    internal   Hyperlipemia    Hypertension    Myalgia due to statin 11/19/2017   Neuropathy    Pneumonia    PONV (postoperative nausea and vomiting)    Skin ulcer of right ankle, limited to breakdown of skin ([place]) 12/31/2016   Sleep apnea    has had in the past lost 50 pounds and do longer uses cpap   Statin intolerance 04/23/2015   Stroke-like episode ([place]) 2009   TIA   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 ([place]) 07/13/2015   Uncontrolled type 2 diabetes mellitus with hyperglycemia ([place]) 11/01/2017    Social History   Tobacco Use   Smoking status: Never Smoker   Smokeless tobacco: Never Used  Substance Use Topics   Alcohol use: Never    Frequency: Never   Drug use: No    Family History  Problem Relation Age of Onset   Lung cancer Father    Hypertension Father    Arthritis Father    Other Mother        hardening of the arteries/renal cell carcenoma   Hypertension Mother    Stroke Mother    Kidney cancer Mother    Arthritis Brother    Rheum arthritis Maternal Uncle    Bladder Cancer Neg Hx     Allergies  Allergen Reactions   Eggs Or Egg-Derived Products Swelling and Other (See Comments)    Angioedema   Atorvastatin Other (See Comments)    Liver toxicity   Flu Virus Vaccine Swelling    Arm swelled (site of injection)   Latex Itching   Pravastatin Itching and Rash   Tape Rash and Other (See Comments)      TAPE PULLS OFF THE SKIN!!! Please use an alternative!!    Objective: Vitals:   01/14/19 0911  BP: 128/68  Pulse: 86  Temp: 98.7 F (37.1 C)   There is no height or weight on file to calculate BMI.  Physical Exam Constitutional:      Comments: She is in good spirits.  She is seated in a wheelchair.  Cardiovascular:     Rate and Rhythm: Normal rate and regular rhythm.  Heart sounds: No murmur.  Pulmonary:     Effort: Pulmonary effort is normal.     Breath sounds: Normal breath sounds.  Musculoskeletal:     Comments: Her left lateral foot incision is healing well.  There is no unusual erythema or swelling.  There is no wound drainage or odor.  Skin:    Comments: Her right arm PICC site looks good.       Lab Results Sed Rate (mm/hr)  Date Value  12/05/2018 60 (H)  12/04/2018 41 (H)  07/12/2018 30   CRP  Date Value  12/05/2018 22.0 mg/dL (H)  12/04/2018 20 mg/L (H)  07/12/2018 6 mg/L  03/12/2018 3.1 mg/dL (H)  03/05/2018 11.3 mg/L (H)     Problem List Items Addressed This Visit      High   Osteomyelitis of ankle or foot, acute, left ([place])    She is responded well to 6 weeks of IV vancomycin for MRSA left foot infection and bacteremia.  Have her PICC line removed and stop vancomycin now.  I will track down the results of her most recent blood work and determine if it is okay to stop all antibiotics or whether it is best to continue a few more weeks with oral doxycycline.  She will follow-up here in 4 weeks.          Michel Bickers, MD [facility] [place] [facility] [phone] pager   [phone] cell 01/14/2019, 9:37 AM

## 2019-01-14 NOTE — Assessment & Plan Note (Signed)
She is responded well to 6 weeks of IV vancomycin for MRSA left foot infection and bacteremia.  Have her PICC line removed and stop vancomycin now.  I will track down the results of her most recent blood work and determine if it is okay to stop all antibiotics or whether it is best to continue a few more weeks with oral doxycycline.  She will follow-up here in 4 weeks.

## 2019-01-15 ENCOUNTER — Telehealth: Payer: Self-pay | Admitting: Podiatry

## 2019-01-15 ENCOUNTER — Encounter: Payer: Self-pay | Admitting: Family Medicine

## 2019-01-15 DIAGNOSIS — Z4801 Encounter for change or removal of surgical wound dressing: Secondary | ICD-10-CM | POA: Diagnosis not present

## 2019-01-15 DIAGNOSIS — E1169 Type 2 diabetes mellitus with other specified complication: Secondary | ICD-10-CM | POA: Diagnosis not present

## 2019-01-15 DIAGNOSIS — B9562 Methicillin resistant Staphylococcus aureus infection as the cause of diseases classified elsewhere: Secondary | ICD-10-CM | POA: Diagnosis not present

## 2019-01-15 DIAGNOSIS — Z4781 Encounter for orthopedic aftercare following surgical amputation: Secondary | ICD-10-CM | POA: Diagnosis not present

## 2019-01-15 DIAGNOSIS — M869 Osteomyelitis, unspecified: Secondary | ICD-10-CM | POA: Diagnosis not present

## 2019-01-15 DIAGNOSIS — Z89422 Acquired absence of other left toe(s): Secondary | ICD-10-CM | POA: Diagnosis not present

## 2019-01-15 NOTE — Telephone Encounter (Signed)
Pt is wondering if she was getting an anti-biotic from infections disease. She is wondering if Dr. Jacqualyn Posey has spoken to the providers in that office. Please call patient

## 2019-01-15 NOTE — Telephone Encounter (Signed)
[name]- this is the last note from [facility] in the chart: "Called and spoke to [place] at [employer]. Gave verbal order to stop patient's vancomycin and pull her PICC today.  Also asked that her last set of labs be faxed to me at [phone] and her last inflammatory markers as well. Tiffany verbalized understanding."  Can you see if they can send [place] the lab work as well. It was dependent on that.

## 2019-01-15 NOTE — Progress Notes (Signed)
Subjective: Olivia Romero is a 63 y.o. is seen today in office s/p left foot fourth and fifth ray amputations with bone biopsy preformed on 12/06/2018 with Dr. [name].  Overall has been doing better.  No pain.  She finishes her IV antibiotics today.  She did follow-up with infectious disease today as well.  Awaiting blood work results to see if she is continue antibiotics.  Denies any systemic complaints such as fevers, chills, nausea, vomiting. No calf pain, chest pain, shortness of breath.   Objective: General: No acute distress, AAOx3  DP/PT pulses palpable CRT < 3 sec to all digits. [name]  LEFT foot: Incision is well coapted without any evidence of dehiscence.  Staples are intact.  Mild edema of the foot there is no erythema or warmth.  No fluctuation crepitation any clinical signs of infection noted today.  No other open lesions or pre-ulcerative lesions.  No pain with calf compression, swelling, warmth, erythema.   Assessment and Plan:  Status post left foot surgery, doing well with no complications   -Treatment options discussed including all alternatives, risks, and complications -Remove the sutures and staples removed today without complications incision remains well coapted.  I want her to continue nonweightbearing for now continue elevation.  Antibiotics per infectious disease.  Return in about 1 week (around 01/21/2019).  Trula Slade DPM

## 2019-01-16 DIAGNOSIS — M869 Osteomyelitis, unspecified: Secondary | ICD-10-CM | POA: Diagnosis not present

## 2019-01-16 DIAGNOSIS — Z4781 Encounter for orthopedic aftercare following surgical amputation: Secondary | ICD-10-CM | POA: Diagnosis not present

## 2019-01-16 DIAGNOSIS — Z4801 Encounter for change or removal of surgical wound dressing: Secondary | ICD-10-CM | POA: Diagnosis not present

## 2019-01-16 DIAGNOSIS — E1169 Type 2 diabetes mellitus with other specified complication: Secondary | ICD-10-CM | POA: Diagnosis not present

## 2019-01-16 DIAGNOSIS — B9562 Methicillin resistant Staphylococcus aureus infection as the cause of diseases classified elsewhere: Secondary | ICD-10-CM | POA: Diagnosis not present

## 2019-01-16 DIAGNOSIS — Z89422 Acquired absence of other left toe(s): Secondary | ICD-10-CM | POA: Diagnosis not present

## 2019-01-16 NOTE — Telephone Encounter (Signed)
Called the [facility] today and spoke with Sharyn Lull and Sharyn Lull stated that the nurse was going out to the patient's home to with draw the picc line today and the labs and the inflammatory markers were being done today and the results would be ready on Monday and I called the patient to let her know as well. [name]

## 2019-01-16 NOTE — Telephone Encounter (Signed)
[place], RN at [facility], called for verbal orders regarding patient's vancomycin, picc and any labs.  RN relayed previous verbal order given to [place] on 8/11.  Per [place], Jonelle Sidle is not able to take verbal orders and she will make sure this duplication of work is not an issue in the future.  [place] will draw labs today before pulling PICC and will send results to [phone]. Landis Gandy, RN

## 2019-01-16 NOTE — Telephone Encounter (Signed)
[name] at Dr Pasty Arch office is requesting final labs also be faxed to him at Fax: [phone].

## 2019-01-17 ENCOUNTER — Other Ambulatory Visit: Payer: Self-pay | Admitting: Internal Medicine

## 2019-01-17 ENCOUNTER — Telehealth: Payer: Self-pay | Admitting: Internal Medicine

## 2019-01-17 DIAGNOSIS — E1169 Type 2 diabetes mellitus with other specified complication: Secondary | ICD-10-CM | POA: Diagnosis not present

## 2019-01-17 DIAGNOSIS — B9562 Methicillin resistant Staphylococcus aureus infection as the cause of diseases classified elsewhere: Secondary | ICD-10-CM | POA: Diagnosis not present

## 2019-01-17 DIAGNOSIS — Z4801 Encounter for change or removal of surgical wound dressing: Secondary | ICD-10-CM | POA: Diagnosis not present

## 2019-01-17 DIAGNOSIS — Z4781 Encounter for orthopedic aftercare following surgical amputation: Secondary | ICD-10-CM | POA: Diagnosis not present

## 2019-01-17 DIAGNOSIS — M86172 Other acute osteomyelitis, left ankle and foot: Secondary | ICD-10-CM

## 2019-01-17 DIAGNOSIS — Z89422 Acquired absence of other left toe(s): Secondary | ICD-10-CM | POA: Diagnosis not present

## 2019-01-17 DIAGNOSIS — M869 Osteomyelitis, unspecified: Secondary | ICD-10-CM | POA: Diagnosis not present

## 2019-01-17 MED ORDER — DOXYCYCLINE HYCLATE 100 MG PO TABS: 100.0000 mg | ORAL_TABLET | Freq: Two times a day (BID) | ORAL | 1 refills | Status: DC

## 2019-01-17 NOTE — Telephone Encounter (Signed)
We were finally able to track down the results of her recent blood work from 01/16/2019.  Her C-reactive protein has returned to normal at 5 but her sed rate remains elevated at 42.  I discussed management options with Olivia Romero and she is in agreement with transitioning to oral doxycycline for 4 more weeks.

## 2019-01-20 NOTE — Progress Notes (Signed)
Subjective: Olivia Romero is a 63 y.o. is seen today in office s/p left foot fourth and fifth ray amputations with bone biopsy preformed on 12/06/2018 with Dr. [name].  She states that she is doing well to try to stay off of her foot as much as possible.  She presents today with a friend.  She is still on the IV antibiotics and tolerating well with any side effects.  She is now home from rehab.  Denies any systemic complaints such as fevers, chills, nausea, vomiting. No calf pain, chest pain, shortness of breath.   Objective: General: No acute distress, AAOx3  DP/PT pulses palpable CRT < 3 sec to all digits. [name]  LEFT foot: Incision is well coapted without any evidence of dehiscence.  Staples are intact edema appears to be improved.  There is no surrounding erythema, ascending cellulitis.  No fracture crepitation malodor.  No other open lesions or pre-ulcerative lesions. No other open lesions or pre-ulcerative lesions.  No pain with calf compression, swelling, warmth, erythema.   Assessment and Plan:  Status post left foot surgery, doing well with no complications   -Treatment options discussed including all alternatives, risks, and complications -I removed about a third of the staples today there are any complications.  After remainder intact.  Betadine was painted over incision followed by dry sterile dressing.  Continue home dressing changes as well by home health care.  Continue nonweightbearing for now.  Home physical therapy.  Will defer to infectious disease in regards to antibiotics.  I will see her back in 1 week to likely remove the sutures, staples.  No follow-ups on file.  Trula Slade DPM

## 2019-01-21 ENCOUNTER — Encounter: Payer: Self-pay | Admitting: Podiatry

## 2019-01-21 ENCOUNTER — Ambulatory Visit (INDEPENDENT_AMBULATORY_CARE_PROVIDER_SITE_OTHER): Payer: Medicare Other | Admitting: Podiatry

## 2019-01-21 ENCOUNTER — Other Ambulatory Visit: Payer: Self-pay

## 2019-01-21 DIAGNOSIS — M14672 Charcot's joint, left ankle and foot: Secondary | ICD-10-CM

## 2019-01-21 DIAGNOSIS — M86272 Subacute osteomyelitis, left ankle and foot: Secondary | ICD-10-CM

## 2019-01-22 DIAGNOSIS — Z4801 Encounter for change or removal of surgical wound dressing: Secondary | ICD-10-CM | POA: Diagnosis not present

## 2019-01-22 DIAGNOSIS — Z89422 Acquired absence of other left toe(s): Secondary | ICD-10-CM | POA: Diagnosis not present

## 2019-01-22 DIAGNOSIS — M869 Osteomyelitis, unspecified: Secondary | ICD-10-CM | POA: Diagnosis not present

## 2019-01-22 DIAGNOSIS — B9562 Methicillin resistant Staphylococcus aureus infection as the cause of diseases classified elsewhere: Secondary | ICD-10-CM | POA: Diagnosis not present

## 2019-01-22 DIAGNOSIS — Z4781 Encounter for orthopedic aftercare following surgical amputation: Secondary | ICD-10-CM | POA: Diagnosis not present

## 2019-01-22 DIAGNOSIS — E1169 Type 2 diabetes mellitus with other specified complication: Secondary | ICD-10-CM | POA: Diagnosis not present

## 2019-01-23 DIAGNOSIS — Z4801 Encounter for change or removal of surgical wound dressing: Secondary | ICD-10-CM | POA: Diagnosis not present

## 2019-01-23 DIAGNOSIS — Z89422 Acquired absence of other left toe(s): Secondary | ICD-10-CM | POA: Diagnosis not present

## 2019-01-23 DIAGNOSIS — E1169 Type 2 diabetes mellitus with other specified complication: Secondary | ICD-10-CM | POA: Diagnosis not present

## 2019-01-23 DIAGNOSIS — M869 Osteomyelitis, unspecified: Secondary | ICD-10-CM | POA: Diagnosis not present

## 2019-01-23 DIAGNOSIS — B9562 Methicillin resistant Staphylococcus aureus infection as the cause of diseases classified elsewhere: Secondary | ICD-10-CM | POA: Diagnosis not present

## 2019-01-23 DIAGNOSIS — Z4781 Encounter for orthopedic aftercare following surgical amputation: Secondary | ICD-10-CM | POA: Diagnosis not present

## 2019-01-24 ENCOUNTER — Encounter: Payer: Self-pay | Admitting: Family Medicine

## 2019-01-24 DIAGNOSIS — E1169 Type 2 diabetes mellitus with other specified complication: Secondary | ICD-10-CM | POA: Diagnosis not present

## 2019-01-24 DIAGNOSIS — Z4781 Encounter for orthopedic aftercare following surgical amputation: Secondary | ICD-10-CM | POA: Diagnosis not present

## 2019-01-24 DIAGNOSIS — Z89422 Acquired absence of other left toe(s): Secondary | ICD-10-CM | POA: Diagnosis not present

## 2019-01-24 DIAGNOSIS — B9562 Methicillin resistant Staphylococcus aureus infection as the cause of diseases classified elsewhere: Secondary | ICD-10-CM | POA: Diagnosis not present

## 2019-01-24 DIAGNOSIS — Z4801 Encounter for change or removal of surgical wound dressing: Secondary | ICD-10-CM | POA: Diagnosis not present

## 2019-01-24 DIAGNOSIS — M869 Osteomyelitis, unspecified: Secondary | ICD-10-CM | POA: Diagnosis not present

## 2019-01-27 NOTE — Progress Notes (Signed)
Subjective: Olivia Romero is a 63 y.o. is seen today in office s/p left foot fourth and fifth ray amputations with bone biopsy preformed on 12/06/2018 with Dr. [name].  She does state that she is continued to improve.  Having no pain or swelling or any redness.  Denies any open sores. Denies any systemic complaints such as fevers, chills, nausea, vomiting. No calf pain, chest pain, shortness of breath.   Objective: General: No acute distress, AAOx3 -presents in wheelchair with a friend. DP/PT pulses palpable CRT < 3 sec to all digits. [name]  LEFT foot: Incision is well coapted without any evidence of dehiscence.  Incisions well-healed.  Scars around the area.  There is no increase in warmth there is no erythema.  No tenderness.  Clubfoot-like deformities present.   No other open lesions or pre-ulcerative lesions.  No pain with calf compression, swelling, warmth, erythema.   Assessment and Plan:  Status post left foot surgery, doing well with no complications   -Treatment options discussed including all alternatives, risks, and complications -Overall she is doing well.  Discussed partial weightbearing to her foot but limited activity given the deformity.  We long discussion regards to reconstruction options.  For now he the nature of the infection is controlled and resolved for proceed with reconstruction.  Moisturizer therapy daily. -Monitor for any clinical signs or symptoms of infection and directed to call the office immediately should any occur or go to the ER.  Return in about 2 weeks (around 02/04/2019).  Trula Slade DPM

## 2019-01-29 DIAGNOSIS — B9562 Methicillin resistant Staphylococcus aureus infection as the cause of diseases classified elsewhere: Secondary | ICD-10-CM | POA: Diagnosis not present

## 2019-01-29 DIAGNOSIS — Z89422 Acquired absence of other left toe(s): Secondary | ICD-10-CM | POA: Diagnosis not present

## 2019-01-29 DIAGNOSIS — Z4781 Encounter for orthopedic aftercare following surgical amputation: Secondary | ICD-10-CM | POA: Diagnosis not present

## 2019-01-29 DIAGNOSIS — M869 Osteomyelitis, unspecified: Secondary | ICD-10-CM | POA: Diagnosis not present

## 2019-01-29 DIAGNOSIS — Z4801 Encounter for change or removal of surgical wound dressing: Secondary | ICD-10-CM | POA: Diagnosis not present

## 2019-01-29 DIAGNOSIS — E1169 Type 2 diabetes mellitus with other specified complication: Secondary | ICD-10-CM | POA: Diagnosis not present

## 2019-01-30 DIAGNOSIS — Z89422 Acquired absence of other left toe(s): Secondary | ICD-10-CM | POA: Diagnosis not present

## 2019-01-30 DIAGNOSIS — E1169 Type 2 diabetes mellitus with other specified complication: Secondary | ICD-10-CM | POA: Diagnosis not present

## 2019-01-30 DIAGNOSIS — Z4781 Encounter for orthopedic aftercare following surgical amputation: Secondary | ICD-10-CM | POA: Diagnosis not present

## 2019-01-30 DIAGNOSIS — M869 Osteomyelitis, unspecified: Secondary | ICD-10-CM | POA: Diagnosis not present

## 2019-01-30 DIAGNOSIS — Z4801 Encounter for change or removal of surgical wound dressing: Secondary | ICD-10-CM | POA: Diagnosis not present

## 2019-01-30 DIAGNOSIS — B9562 Methicillin resistant Staphylococcus aureus infection as the cause of diseases classified elsewhere: Secondary | ICD-10-CM | POA: Diagnosis not present

## 2019-01-31 DIAGNOSIS — Z89422 Acquired absence of other left toe(s): Secondary | ICD-10-CM | POA: Diagnosis not present

## 2019-01-31 DIAGNOSIS — Z4781 Encounter for orthopedic aftercare following surgical amputation: Secondary | ICD-10-CM | POA: Diagnosis not present

## 2019-01-31 DIAGNOSIS — B9562 Methicillin resistant Staphylococcus aureus infection as the cause of diseases classified elsewhere: Secondary | ICD-10-CM | POA: Diagnosis not present

## 2019-01-31 DIAGNOSIS — M869 Osteomyelitis, unspecified: Secondary | ICD-10-CM | POA: Diagnosis not present

## 2019-01-31 DIAGNOSIS — Z4801 Encounter for change or removal of surgical wound dressing: Secondary | ICD-10-CM | POA: Diagnosis not present

## 2019-01-31 DIAGNOSIS — E1169 Type 2 diabetes mellitus with other specified complication: Secondary | ICD-10-CM | POA: Diagnosis not present

## 2019-02-01 DIAGNOSIS — Z4781 Encounter for orthopedic aftercare following surgical amputation: Secondary | ICD-10-CM | POA: Diagnosis not present

## 2019-02-01 DIAGNOSIS — M869 Osteomyelitis, unspecified: Secondary | ICD-10-CM | POA: Diagnosis not present

## 2019-02-01 DIAGNOSIS — E1122 Type 2 diabetes mellitus with diabetic chronic kidney disease: Secondary | ICD-10-CM | POA: Diagnosis not present

## 2019-02-01 DIAGNOSIS — M6281 Muscle weakness (generalized): Secondary | ICD-10-CM | POA: Diagnosis not present

## 2019-02-01 DIAGNOSIS — I129 Hypertensive chronic kidney disease with stage 1 through stage 4 chronic kidney disease, or unspecified chronic kidney disease: Secondary | ICD-10-CM | POA: Diagnosis not present

## 2019-02-01 DIAGNOSIS — M150 Primary generalized (osteo)arthritis: Secondary | ICD-10-CM | POA: Diagnosis not present

## 2019-02-01 DIAGNOSIS — Z8631 Personal history of diabetic foot ulcer: Secondary | ICD-10-CM | POA: Diagnosis not present

## 2019-02-01 DIAGNOSIS — N189 Chronic kidney disease, unspecified: Secondary | ICD-10-CM | POA: Diagnosis not present

## 2019-02-01 DIAGNOSIS — M48062 Spinal stenosis, lumbar region with neurogenic claudication: Secondary | ICD-10-CM | POA: Diagnosis not present

## 2019-02-01 DIAGNOSIS — E1140 Type 2 diabetes mellitus with diabetic neuropathy, unspecified: Secondary | ICD-10-CM | POA: Diagnosis not present

## 2019-02-01 DIAGNOSIS — J45909 Unspecified asthma, uncomplicated: Secondary | ICD-10-CM | POA: Diagnosis not present

## 2019-02-01 DIAGNOSIS — B9562 Methicillin resistant Staphylococcus aureus infection as the cause of diseases classified elsewhere: Secondary | ICD-10-CM | POA: Diagnosis not present

## 2019-02-01 DIAGNOSIS — E1151 Type 2 diabetes mellitus with diabetic peripheral angiopathy without gangrene: Secondary | ICD-10-CM | POA: Diagnosis not present

## 2019-02-01 DIAGNOSIS — I69391 Dysphagia following cerebral infarction: Secondary | ICD-10-CM | POA: Diagnosis not present

## 2019-02-01 DIAGNOSIS — R269 Unspecified abnormalities of gait and mobility: Secondary | ICD-10-CM | POA: Diagnosis not present

## 2019-02-01 DIAGNOSIS — E1169 Type 2 diabetes mellitus with other specified complication: Secondary | ICD-10-CM | POA: Diagnosis not present

## 2019-02-01 DIAGNOSIS — E11610 Type 2 diabetes mellitus with diabetic neuropathic arthropathy: Secondary | ICD-10-CM | POA: Diagnosis not present

## 2019-02-01 DIAGNOSIS — R1310 Dysphagia, unspecified: Secondary | ICD-10-CM | POA: Diagnosis not present

## 2019-02-01 DIAGNOSIS — Z4801 Encounter for change or removal of surgical wound dressing: Secondary | ICD-10-CM | POA: Diagnosis not present

## 2019-02-01 DIAGNOSIS — Z89422 Acquired absence of other left toe(s): Secondary | ICD-10-CM | POA: Diagnosis not present

## 2019-02-01 DIAGNOSIS — Z6836 Body mass index (BMI) 36.0-36.9, adult: Secondary | ICD-10-CM | POA: Diagnosis not present

## 2019-02-01 DIAGNOSIS — D649 Anemia, unspecified: Secondary | ICD-10-CM | POA: Diagnosis not present

## 2019-02-03 ENCOUNTER — Telehealth: Payer: Self-pay | Admitting: Family Medicine

## 2019-02-03 NOTE — Telephone Encounter (Signed)
Olivia Romero from [facility] would like a call back regarding some orders for this patient. She is a patient of Dr Sanda Klein.

## 2019-02-03 NOTE — Telephone Encounter (Signed)
No phone number or details left for me to call back?

## 2019-02-04 ENCOUNTER — Other Ambulatory Visit: Payer: Self-pay

## 2019-02-04 ENCOUNTER — Ambulatory Visit (INDEPENDENT_AMBULATORY_CARE_PROVIDER_SITE_OTHER): Payer: Medicare Other | Admitting: Podiatry

## 2019-02-04 DIAGNOSIS — M14672 Charcot's joint, left ankle and foot: Secondary | ICD-10-CM

## 2019-02-04 DIAGNOSIS — M86272 Subacute osteomyelitis, left ankle and foot: Secondary | ICD-10-CM

## 2019-02-05 DIAGNOSIS — Z4781 Encounter for orthopedic aftercare following surgical amputation: Secondary | ICD-10-CM | POA: Diagnosis not present

## 2019-02-05 DIAGNOSIS — E1169 Type 2 diabetes mellitus with other specified complication: Secondary | ICD-10-CM | POA: Diagnosis not present

## 2019-02-05 DIAGNOSIS — M869 Osteomyelitis, unspecified: Secondary | ICD-10-CM | POA: Diagnosis not present

## 2019-02-05 DIAGNOSIS — Z4801 Encounter for change or removal of surgical wound dressing: Secondary | ICD-10-CM | POA: Diagnosis not present

## 2019-02-05 DIAGNOSIS — Z89422 Acquired absence of other left toe(s): Secondary | ICD-10-CM | POA: Diagnosis not present

## 2019-02-05 DIAGNOSIS — B9562 Methicillin resistant Staphylococcus aureus infection as the cause of diseases classified elsewhere: Secondary | ICD-10-CM | POA: Diagnosis not present

## 2019-02-06 DIAGNOSIS — M869 Osteomyelitis, unspecified: Secondary | ICD-10-CM | POA: Diagnosis not present

## 2019-02-06 DIAGNOSIS — Z4781 Encounter for orthopedic aftercare following surgical amputation: Secondary | ICD-10-CM | POA: Diagnosis not present

## 2019-02-06 DIAGNOSIS — E1169 Type 2 diabetes mellitus with other specified complication: Secondary | ICD-10-CM | POA: Diagnosis not present

## 2019-02-06 DIAGNOSIS — Z89422 Acquired absence of other left toe(s): Secondary | ICD-10-CM | POA: Diagnosis not present

## 2019-02-06 DIAGNOSIS — Z4801 Encounter for change or removal of surgical wound dressing: Secondary | ICD-10-CM | POA: Diagnosis not present

## 2019-02-06 DIAGNOSIS — B9562 Methicillin resistant Staphylococcus aureus infection as the cause of diseases classified elsewhere: Secondary | ICD-10-CM | POA: Diagnosis not present

## 2019-02-07 DIAGNOSIS — Z4781 Encounter for orthopedic aftercare following surgical amputation: Secondary | ICD-10-CM | POA: Diagnosis not present

## 2019-02-07 DIAGNOSIS — E1169 Type 2 diabetes mellitus with other specified complication: Secondary | ICD-10-CM | POA: Diagnosis not present

## 2019-02-07 DIAGNOSIS — M869 Osteomyelitis, unspecified: Secondary | ICD-10-CM | POA: Diagnosis not present

## 2019-02-07 DIAGNOSIS — Z4801 Encounter for change or removal of surgical wound dressing: Secondary | ICD-10-CM | POA: Diagnosis not present

## 2019-02-07 DIAGNOSIS — B9562 Methicillin resistant Staphylococcus aureus infection as the cause of diseases classified elsewhere: Secondary | ICD-10-CM | POA: Diagnosis not present

## 2019-02-07 DIAGNOSIS — Z89422 Acquired absence of other left toe(s): Secondary | ICD-10-CM | POA: Diagnosis not present

## 2019-02-11 NOTE — Progress Notes (Signed)
Subjective: Olivia Romero is a 63 y.o. is seen today in office s/p left foot fourth and fifth ray amputations with bone biopsy preformed on 12/06/2018 with Dr. [name].  She states that her foot is doing well.  She is had no open sores and she denies any increase in swelling or redness.  She has no new concerns.  She has been putting this monitor weight on her foot.  Denies any fevers, chills, nausea, vomiting.  No calf pain, chest pain, shortness of breath.  Objective: General: No acute distress, AAOx3 -presents in wheelchair with a friend. DP/PT pulses palpable CRT < 3 sec to all digits. [name]  LEFT foot: Incision is well coapted without any evidence of dehiscence.  The wound is completely healed there is no new ulcerations present.  There is almost a clubfoot foot like deformity present to her foot, ankle.  No signs of active infection. No pain with calf compression, swelling, warmth, erythema.   Assessment and Plan:  Status post left foot surgery, doing well with no complications   -Treatment options discussed including all alternatives, risks, and complications -Incision still has no new wound.  We discussed she can start to put some weight on her foot but I do not want her doing a lot of walking.  Ultimately she is going need to have a reconstruction to have a somewhat normal gait and help prevent any future ulcerations.  Ultimately discussed external fixation versus IM rod however we need to make sure the infection is controlled prior to any surgical invention.  Return in about 2 weeks (around 02/18/2019).  Trula Slade DPM

## 2019-02-12 DIAGNOSIS — Z4801 Encounter for change or removal of surgical wound dressing: Secondary | ICD-10-CM | POA: Diagnosis not present

## 2019-02-12 DIAGNOSIS — M869 Osteomyelitis, unspecified: Secondary | ICD-10-CM | POA: Diagnosis not present

## 2019-02-12 DIAGNOSIS — E1169 Type 2 diabetes mellitus with other specified complication: Secondary | ICD-10-CM | POA: Diagnosis not present

## 2019-02-12 DIAGNOSIS — Z89422 Acquired absence of other left toe(s): Secondary | ICD-10-CM | POA: Diagnosis not present

## 2019-02-12 DIAGNOSIS — Z4781 Encounter for orthopedic aftercare following surgical amputation: Secondary | ICD-10-CM | POA: Diagnosis not present

## 2019-02-12 DIAGNOSIS — B9562 Methicillin resistant Staphylococcus aureus infection as the cause of diseases classified elsewhere: Secondary | ICD-10-CM | POA: Diagnosis not present

## 2019-02-14 DIAGNOSIS — B9562 Methicillin resistant Staphylococcus aureus infection as the cause of diseases classified elsewhere: Secondary | ICD-10-CM | POA: Diagnosis not present

## 2019-02-14 DIAGNOSIS — E1169 Type 2 diabetes mellitus with other specified complication: Secondary | ICD-10-CM | POA: Diagnosis not present

## 2019-02-14 DIAGNOSIS — Z4801 Encounter for change or removal of surgical wound dressing: Secondary | ICD-10-CM | POA: Diagnosis not present

## 2019-02-14 DIAGNOSIS — M869 Osteomyelitis, unspecified: Secondary | ICD-10-CM | POA: Diagnosis not present

## 2019-02-14 DIAGNOSIS — Z4781 Encounter for orthopedic aftercare following surgical amputation: Secondary | ICD-10-CM | POA: Diagnosis not present

## 2019-02-14 DIAGNOSIS — Z89422 Acquired absence of other left toe(s): Secondary | ICD-10-CM | POA: Diagnosis not present

## 2019-02-18 ENCOUNTER — Other Ambulatory Visit: Payer: Self-pay

## 2019-02-18 ENCOUNTER — Ambulatory Visit (INDEPENDENT_AMBULATORY_CARE_PROVIDER_SITE_OTHER): Payer: Self-pay | Admitting: Podiatry

## 2019-02-18 VITALS — Temp 97.5°F

## 2019-02-18 DIAGNOSIS — Z4781 Encounter for orthopedic aftercare following surgical amputation: Secondary | ICD-10-CM | POA: Diagnosis not present

## 2019-02-18 DIAGNOSIS — E1169 Type 2 diabetes mellitus with other specified complication: Secondary | ICD-10-CM | POA: Diagnosis not present

## 2019-02-18 DIAGNOSIS — Z89422 Acquired absence of other left toe(s): Secondary | ICD-10-CM | POA: Diagnosis not present

## 2019-02-18 DIAGNOSIS — B9562 Methicillin resistant Staphylococcus aureus infection as the cause of diseases classified elsewhere: Secondary | ICD-10-CM | POA: Diagnosis not present

## 2019-02-18 DIAGNOSIS — M14672 Charcot's joint, left ankle and foot: Secondary | ICD-10-CM

## 2019-02-18 DIAGNOSIS — Z4801 Encounter for change or removal of surgical wound dressing: Secondary | ICD-10-CM | POA: Diagnosis not present

## 2019-02-18 DIAGNOSIS — M869 Osteomyelitis, unspecified: Secondary | ICD-10-CM | POA: Diagnosis not present

## 2019-02-19 NOTE — Progress Notes (Signed)
Subjective: Olivia Romero is a 63 y.o. is seen today in office s/p left foot fourth and fifth ray amputations with bone biopsy preformed on 12/06/2018 with Dr. [name].  She states that she is doing well in physical therapy is been working with her and she is started to walk a little bit more around the house.  She denies any open sores, pain, swelling or redness.  No other concerns.  She does finish her antibiotics this morning. Denies any fevers, chills, nausea, vomiting.  No calf pain, chest pain, shortness of breath.  Objective: General: No acute distress, AAOx3 -presents in wheelchair with a friend. DP/PT pulses palpable CRT < 3 sec to all digits. [name]  LEFT foot: Incision is well coapted without any evidence of dehiscence.  The wound is completely healed there is no new ulcerations present.  There a clubfoot foot like deformity present to her foot, ankle.  No signs of active infection. No pain with calf compression, swelling, warmth, erythema.   Assessment and Plan:  Status post left foot surgery, doing well with no complications   -Treatment options discussed including all alternatives, risks, and complications -Incision is healed no new sores are present.  No signs of infection.  Continue with offloading.  Follow with infectious disease as scheduled.  We can discuss surgical reconstruction biotics for the infections control.  Once infection seems to be controlled we will start preoperative planning for this.  RTC 2 weeks  Trula Slade DPM

## 2019-02-20 DIAGNOSIS — Z89422 Acquired absence of other left toe(s): Secondary | ICD-10-CM | POA: Diagnosis not present

## 2019-02-20 DIAGNOSIS — Z4781 Encounter for orthopedic aftercare following surgical amputation: Secondary | ICD-10-CM | POA: Diagnosis not present

## 2019-02-20 DIAGNOSIS — M869 Osteomyelitis, unspecified: Secondary | ICD-10-CM | POA: Diagnosis not present

## 2019-02-20 DIAGNOSIS — E1169 Type 2 diabetes mellitus with other specified complication: Secondary | ICD-10-CM | POA: Diagnosis not present

## 2019-02-20 DIAGNOSIS — B9562 Methicillin resistant Staphylococcus aureus infection as the cause of diseases classified elsewhere: Secondary | ICD-10-CM | POA: Diagnosis not present

## 2019-02-20 DIAGNOSIS — Z4801 Encounter for change or removal of surgical wound dressing: Secondary | ICD-10-CM | POA: Diagnosis not present

## 2019-02-24 ENCOUNTER — Ambulatory Visit (INDEPENDENT_AMBULATORY_CARE_PROVIDER_SITE_OTHER): Payer: Medicare Other | Admitting: Internal Medicine

## 2019-02-24 ENCOUNTER — Other Ambulatory Visit: Payer: Self-pay

## 2019-02-24 DIAGNOSIS — M86172 Other acute osteomyelitis, left ankle and foot: Secondary | ICD-10-CM

## 2019-02-24 NOTE — Progress Notes (Signed)
[facility] for Infectious Disease  Patient Active Problem List   Diagnosis Date Noted   Osteomyelitis of ankle or foot, acute, left ([place]) 12/05/2018    Priority: High   MRSA bacteremia 12/05/2018    Priority: High   Charcot's joint of foot, left    Post-operative state    PAD (peripheral artery disease) ([place]) 12/05/2018   Sepsis without acute organ dysfunction ([place])    Charcot's joint of foot due to diabetes ([place]) 09/18/2018   BMI 36.0-36.9,adult    Morbid obesity ([place]) 11/19/2017   B12 deficiency 11/01/2017   Diabetic foot ulcer ([place]) 12/14/2016   Diabetic polyneuropathy associated with type 2 diabetes mellitus ([place]) 12/13/2015   Lumbar stenosis with neurogenic claudication 09/02/2015   Chronic constipation 07/13/2015   OSA (obstructive sleep apnea) 06/25/2015   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/23/2015   Hypertriglyceridemia 04/23/2015   Asthma, mild intermittent 02/01/2015   Type 2 diabetes mellitus with diabetic nephropathy ([place]) 12/31/2014   Gastroesophageal reflux disease with esophagitis 12/31/2014   Dysphagia 12/31/2014   Bilateral carpal tunnel syndrome 12/01/2014   Diverticulosis of colon (without mention of hemorrhage) 11/30/2012   Essential hypertension, benign 11/28/2012    Patient's Medications  New Prescriptions   No medications on file  Previous Medications   ACETAMINOPHEN (TYLENOL) 500 MG TABLET    Take 2 tablets (1,000 mg total) by mouth every 6 (six) hours as needed for mild pain. Maximum of 3,000 mg per day   ALBUTEROL SULFATE (PROAIR RESPICLICK) 197 (90 BASE) MCG/ACT AEPB    Inhale 1-2 puffs into the lungs every 6 (six) hours as needed (Shortness of breath or wheezing).   ASPIRIN EC 325 MG TABLET    Take 1 tablet (325 mg total) by mouth daily.   BLOOD GLUCOSE MONITORING SUPPL (CONTOUR NEXT MONITOR) W/DEVICE KIT    as directed.    DICLOFENAC SODIUM (VOLTAREN) 1 % GEL    Apply 2 g topically 4  (four) times daily. Rub into affected area of foot 2 to 4 times daily   DULOXETINE (CYMBALTA) 60 MG CAPSULE    Take 60 mg by mouth at bedtime.   FENOFIBRATE (TRICOR) 145 MG TABLET    Take 145 mg by mouth daily.   GABAPENTIN (NEURONTIN) 400 MG CAPSULE    Take 800 mg by mouth 3 (three) times daily.    GLIMEPIRIDE (AMARYL) 2 MG TABLET    Take 2 mg by mouth 2 (two) times daily.    HYDROCHLOROTHIAZIDE 10 MG/ML SUSP    Take 0.63 mLs (6.25 mg total) by mouth daily.   INSULIN DEGLUDEC (TRESIBA FLEXTOUCH) 200 UNIT/ML SOPN    Inject 20 Units into the skin at bedtime.    METFORMIN (GLUCOPHAGE-XR) 750 MG 24 HR TABLET       PANTOPRAZOLE (PROTONIX) 40 MG TABLET    Take 1 tablet (40 mg total) by mouth daily.   ULTICARE SHORT PEN NEEDLES 31G X 8 MM MISC      Modified Medications   No medications on file  Discontinued Medications   DOXYCYCLINE (VIBRA-TABS) 100 MG TABLET    Take 1 tablet (100 mg total) by mouth 2 (two) times daily.   METFORMIN (GLUCOPHAGE-XR) 500 MG 24 HR TABLET    Take 1,500 mg by mouth daily.   VANCOMYCIN (VANCOCIN) 10 G SOLR INJECTION       VANCOMYCIN 1,500 MG IN SODIUM CHLORIDE 0.9 % 500 ML    Inject 1,500 mg into the vein  daily.    Subjective: Olivia Romero is in for her hospital follow-up visit.  She was hospitalized in early July with MRSA bacteremia complicating a left diabetic foot infection. 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 No organisms were seen on operative Gram stain and operative cultures grew MRSA. No vegetations were seen on TTE.  Repeat blood cultures became negative quickly.  I did not feel she needed a TEE since she already had an indication for prolonged IV vancomycin.  She completed 6 weeks of therapy. She has not had any fever, nausea, vomiting or diarrhea. Her C-reactive protein returned to normal at 5 but her sed rate remained elevated at 42.  I discussed management options with Olivia Romero and agreed with  transitioning to oral doxycycline for 4 more weeks.  She completed doxycycline therapy 1 week ago.  Review of Systems: Review of Systems  Constitutional: Negative for fever.  Gastrointestinal: Negative for abdominal pain, diarrhea, nausea and vomiting.  Musculoskeletal: Negative for joint pain.    Past Medical History:  Diagnosis Date   Anemia    Arthritis    Asthma    Carpal tunnel syndrome on right    Colon polyps    adenomatous   Diabetes mellitus without complication (HCC)    Diabetic infection of left foot ([place]) 12/2018   Diverticulosis of colon    Esophagitis    GERD (gastroesophageal reflux disease)    Hemorrhoid    internal   Hyperlipemia    Hypertension    Myalgia due to statin 11/19/2017   Neuropathy    Pneumonia    PONV (postoperative nausea and vomiting)    Skin ulcer of right ankle, limited to breakdown of skin ([place]) 12/31/2016   Sleep apnea    has had in the past lost 50 pounds and do longer uses cpap   Statin intolerance 04/23/2015   Stroke-like episode ([place]) 2009   TIA   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 ([place]) 07/13/2015   Uncontrolled type 2 diabetes mellitus with hyperglycemia ([place]) 11/01/2017    Social History   Tobacco Use   Smoking status: Never Smoker   Smokeless tobacco: Never Used  Substance Use Topics   Alcohol use: Never    Frequency: Never   Drug use: No    Family History  Problem Relation Age of Onset   Lung cancer Father    Hypertension Father    Arthritis Father    Other Mother        hardening of the arteries/renal cell carcenoma   Hypertension Mother    Stroke Mother    Kidney cancer Mother    Arthritis Brother    Rheum arthritis Maternal Uncle    Bladder Cancer Neg Hx     Allergies  Allergen Reactions   Eggs Or Egg-Derived Products Swelling and Other (See Comments)    Angioedema   Atorvastatin Other (See Comments)    Liver toxicity   Flu Virus Vaccine  Swelling    Arm swelled (site of injection)   Latex Itching   Pravastatin Itching and Rash   Tape Rash and Other (See Comments)    TAPE PULLS OFF THE SKIN!!! Please use an alternative!!    Objective: Vitals:   02/24/19 0906  BP: 122/74  Pulse: 81  Temp: 99.1 F (37.3 C)  TempSrc: Oral  SpO2: 99%   There is no height or weight on file to calculate BMI.  Physical Exam Constitutional:  Comments: She is pleasant and in good spirits.  She is seated in a wheelchair.  Musculoskeletal:     Comments: Her left fourth and fifth toes are surgically absent.  Her incision has healed nicely without any signs of active infection.       Problem List Items Addressed This Visit      High   Osteomyelitis of ankle or foot, acute, left ([place])    I am hopeful that her left foot osteomyelitis and MRSA bacteremia have been cured through combination of surgery and a lengthy course of antibiotics.  She will stay off of antibiotics and follow-up here in 6 weeks.          Michel Bickers, MD [facility] for Infectious [facility] [phone] pager   [phone] cell 02/24/2019, 9:15 AM

## 2019-02-24 NOTE — Assessment & Plan Note (Signed)
I am hopeful that her left foot osteomyelitis and MRSA bacteremia have been cured through combination of surgery and a lengthy course of antibiotics.  She will stay off of antibiotics and follow-up here in 6 weeks.

## 2019-02-27 DIAGNOSIS — Z89422 Acquired absence of other left toe(s): Secondary | ICD-10-CM | POA: Diagnosis not present

## 2019-02-27 DIAGNOSIS — E1169 Type 2 diabetes mellitus with other specified complication: Secondary | ICD-10-CM | POA: Diagnosis not present

## 2019-02-27 DIAGNOSIS — Z4801 Encounter for change or removal of surgical wound dressing: Secondary | ICD-10-CM | POA: Diagnosis not present

## 2019-02-27 DIAGNOSIS — Z4781 Encounter for orthopedic aftercare following surgical amputation: Secondary | ICD-10-CM | POA: Diagnosis not present

## 2019-02-27 DIAGNOSIS — B9562 Methicillin resistant Staphylococcus aureus infection as the cause of diseases classified elsewhere: Secondary | ICD-10-CM | POA: Diagnosis not present

## 2019-02-27 DIAGNOSIS — M869 Osteomyelitis, unspecified: Secondary | ICD-10-CM | POA: Diagnosis not present

## 2019-02-28 DIAGNOSIS — Z4801 Encounter for change or removal of surgical wound dressing: Secondary | ICD-10-CM | POA: Diagnosis not present

## 2019-02-28 DIAGNOSIS — Z89422 Acquired absence of other left toe(s): Secondary | ICD-10-CM | POA: Diagnosis not present

## 2019-02-28 DIAGNOSIS — Z4781 Encounter for orthopedic aftercare following surgical amputation: Secondary | ICD-10-CM | POA: Diagnosis not present

## 2019-02-28 DIAGNOSIS — E1169 Type 2 diabetes mellitus with other specified complication: Secondary | ICD-10-CM | POA: Diagnosis not present

## 2019-02-28 DIAGNOSIS — B9562 Methicillin resistant Staphylococcus aureus infection as the cause of diseases classified elsewhere: Secondary | ICD-10-CM | POA: Diagnosis not present

## 2019-02-28 DIAGNOSIS — M869 Osteomyelitis, unspecified: Secondary | ICD-10-CM | POA: Diagnosis not present

## 2019-03-04 ENCOUNTER — Other Ambulatory Visit: Payer: Self-pay

## 2019-03-04 ENCOUNTER — Ambulatory Visit (INDEPENDENT_AMBULATORY_CARE_PROVIDER_SITE_OTHER): Payer: Medicare Other | Admitting: Podiatry

## 2019-03-04 DIAGNOSIS — M14672 Charcot's joint, left ankle and foot: Secondary | ICD-10-CM | POA: Diagnosis not present

## 2019-03-04 DIAGNOSIS — Z09 Encounter for follow-up examination after completed treatment for conditions other than malignant neoplasm: Secondary | ICD-10-CM | POA: Diagnosis not present

## 2019-03-04 NOTE — Progress Notes (Signed)
Subjective: Olivia Romero is a 63 y.o. is seen today in office s/p left foot fourth and fifth ray amputations with bone biopsy preformed on 12/06/2018 with Dr. [name].  She follow-up with infectious disease.  She is no longer any antibiotics.  Again to monitor for 6 more weeks to see how she does.  She states that she is foot more and walking more.  The foot may be turning somewhat more but overall doing well.  No open sores that she can see no swelling or redness.  No pain.  Denies any fevers, chills, nausea, vomiting.  No calf pain, chest pain, shortness of breath.  Objective: General: No acute distress, AAOx3 -presents in wheelchair with a friend. DP/PT pulses palpable CRT < 3 sec to all digits. [name]  LEFT foot: Incision is well coapted without any evidence of dehiscence.  The wound is completely healed there is no new ulcerations present.  There a clubfoot foot like deformity present to her foot, ankle.  There is pressure along more the cuboid joint area but no open sore identified.  No signs of active infection. No pain with calf compression, swelling, warmth, erythema.   Assessment and Plan:  Status post left foot surgery, doing well with no complications   -Treatment options discussed including all alternatives, risks, and complications -At this point she is off of antibiotics tomorrow and see how she does over the next 6 weeks.  I do not continue offloading to the cuboid area to avoid any excess pressure which could also lead to ulceration.  Ultimately she is planning to have some tendon reconstruction in order to have somewhat normal gait.  Return in about 3 weeks (around 03/25/2019).  Trula Slade DPM

## 2019-03-21 DIAGNOSIS — E1169 Type 2 diabetes mellitus with other specified complication: Secondary | ICD-10-CM | POA: Diagnosis not present

## 2019-03-21 DIAGNOSIS — E669 Obesity, unspecified: Secondary | ICD-10-CM | POA: Diagnosis not present

## 2019-03-25 ENCOUNTER — Telehealth: Payer: Self-pay | Admitting: *Deleted

## 2019-03-25 ENCOUNTER — Other Ambulatory Visit: Payer: Self-pay

## 2019-03-25 ENCOUNTER — Ambulatory Visit (INDEPENDENT_AMBULATORY_CARE_PROVIDER_SITE_OTHER): Payer: Medicare Other | Admitting: Podiatry

## 2019-03-25 DIAGNOSIS — M14672 Charcot's joint, left ankle and foot: Secondary | ICD-10-CM

## 2019-03-25 DIAGNOSIS — Z09 Encounter for follow-up examination after completed treatment for conditions other than malignant neoplasm: Secondary | ICD-10-CM

## 2019-03-25 DIAGNOSIS — M86272 Subacute osteomyelitis, left ankle and foot: Secondary | ICD-10-CM

## 2019-03-25 NOTE — Telephone Encounter (Signed)
-----   Message from Trula Slade, DPM sent at 03/25/2019 12:56 PM EDT ----- Can you please order left tib-fib and foot x-rays? It was going to be hard for [place] to do in the office so can you order them for [facility]? Thanks.

## 2019-03-25 NOTE — Progress Notes (Signed)
Subjective: Olivia Romero is a 63 y.o. is seen today in office s/p left foot fourth and fifth ray amputations with bone biopsy preformed on 12/06/2018 with Dr. [name].  She states that she has been doing well.  She not had any skin breakdown no swelling no pain.  No redness or warmth.  She is not on any antibiotics.  She did hit her big toe last night, while on the toenail.  Denies any drainage or pus.  No swelling.  Denies any fevers, chills, nausea, vomiting.  No calf pain, chest pain, shortness of breath.  Objective: General: No acute distress, AAOx3 -presents in wheelchair with a friend. DP/PT pulses palpable CRT < 3 sec to all digits. [name]  LEFT foot: Incision is well coapted without any evidence of dehiscence and a scar is well formed.  There is no skin breakdown.  No swelling or redness surrounding warmth of the foot.  No active signs of infection.  There is mild erythema to the lateral hallux toenail where she hit the toe last night.  No signs of infection otherwise.  No drainage or pus.  No swelling.  No pain. No pain with calf compression, swelling, warmth, erythema.   Assessment and Plan:  Charcot  -Treatment options discussed including all alternatives, risks, and complications -From infection standpoint she seems be doing well.  She has an appointment to see infectious disease coming up.  I will see her after that point.  I am going to order x-rays and possibly a CT scan pending x-ray results for possible surgical planning. -Recommend a small amount of antibiotic ointment to the left hallux toenail daily.  Monitor for any signs or symptoms of infection.  Return in about 3 weeks (around 04/15/2019).  Trula Slade DPM

## 2019-03-28 DIAGNOSIS — Z794 Long term (current) use of insulin: Secondary | ICD-10-CM | POA: Diagnosis not present

## 2019-03-28 DIAGNOSIS — E11610 Type 2 diabetes mellitus with diabetic neuropathic arthropathy: Secondary | ICD-10-CM | POA: Diagnosis not present

## 2019-03-28 DIAGNOSIS — E1169 Type 2 diabetes mellitus with other specified complication: Secondary | ICD-10-CM | POA: Diagnosis not present

## 2019-03-28 DIAGNOSIS — E1142 Type 2 diabetes mellitus with diabetic polyneuropathy: Secondary | ICD-10-CM | POA: Diagnosis not present

## 2019-03-28 DIAGNOSIS — E669 Obesity, unspecified: Secondary | ICD-10-CM | POA: Diagnosis not present

## 2019-04-07 ENCOUNTER — Ambulatory Visit (INDEPENDENT_AMBULATORY_CARE_PROVIDER_SITE_OTHER): Payer: Medicare Other | Admitting: Podiatry

## 2019-04-07 ENCOUNTER — Other Ambulatory Visit: Payer: Self-pay

## 2019-04-07 ENCOUNTER — Encounter: Payer: Self-pay | Admitting: Podiatry

## 2019-04-07 DIAGNOSIS — M14672 Charcot's joint, left ankle and foot: Secondary | ICD-10-CM | POA: Diagnosis not present

## 2019-04-07 DIAGNOSIS — L02612 Cutaneous abscess of left foot: Secondary | ICD-10-CM

## 2019-04-07 DIAGNOSIS — L03032 Cellulitis of left toe: Secondary | ICD-10-CM

## 2019-04-07 DIAGNOSIS — L97521 Non-pressure chronic ulcer of other part of left foot limited to breakdown of skin: Secondary | ICD-10-CM | POA: Diagnosis not present

## 2019-04-07 MED ORDER — MUPIROCIN 2 % EX OINT: 1.0000 "application " | TOPICAL_OINTMENT | Freq: Two times a day (BID) | CUTANEOUS | 2 refills | Status: DC

## 2019-04-07 MED ORDER — CEPHALEXIN 500 MG PO CAPS: 500.0000 mg | ORAL_CAPSULE | Freq: Three times a day (TID) | ORAL | 0 refills | Status: DC

## 2019-04-07 NOTE — Progress Notes (Signed)
Subjective: 63 year old female presents the office today for same-day appointment.  She has noticed over the weekend that a small piece of skin did peel off the foot and she has a wound on her foot.  She denies any drainage or pus coming from the skin.  She states that upon her daughter-in-law walking at the foot she noticed there was drainage coming from the left big toe.  She has had some redness on the left big toe but no pain now she does have neuropathy.  No red streaks. Denies any systemic complaints such as fevers, chills, nausea, vomiting. No acute changes since last appointment, and no other complaints at this time.   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  There is a superficial wound present along the proximal aspect of the incision along the cuboid.  Measures 1.4 x 0.8 cm and superficial with a granular wound base.  There is no probing to bone, undermining or tunneling.  No surrounding erythema, ascending cellulitis.  No fluctuation crepitation or any malodor. There is drainage coming from the lateral aspect the left hallux toenail with localized edema and erythema and there is a small amount of purulence identified.  There is no ascending cellulitis. No pain with calf compression, swelling, warmth, erythema  Assessment: New ulceration left foot without signs of infection; abscess left hallux toenail  Plan: -All treatment options discussed with the patient including all alternatives, risks, complications.  -I cleaned the wound today with alcohol on the left hallux.  Given the infection I did sharply remove the lateral border of the toenail with any complications and wound culture was obtained for superficial abscess.  Will start Keflex.  Prescribed mupirocin ointment. -For the left foot ulceration will do Actisorb daily.  This was dispensed today.  Offloading at all times and try to limit  weightbearing. -Patient encouraged to call the office with any questions, concerns, change in symptoms.   RTC 1 week or sooner if needed  Trula Slade DPM

## 2019-04-08 ENCOUNTER — Ambulatory Visit (INDEPENDENT_AMBULATORY_CARE_PROVIDER_SITE_OTHER): Payer: Medicare Other | Admitting: Internal Medicine

## 2019-04-08 ENCOUNTER — Encounter: Payer: Self-pay | Admitting: Internal Medicine

## 2019-04-08 DIAGNOSIS — M86172 Other acute osteomyelitis, left ankle and foot: Secondary | ICD-10-CM | POA: Diagnosis not present

## 2019-04-08 NOTE — Progress Notes (Signed)
[name] for Infectious Disease  Patient Active Problem List   Diagnosis Date Noted  . Osteomyelitis of ankle or foot, acute, left ([place]) 12/05/2018    Priority: High  . MRSA bacteremia 12/05/2018    Priority: High  . Charcot's joint of foot, left   . Post-operative state   . PAD (peripheral artery disease) ([place]) 12/05/2018  . Sepsis without acute organ dysfunction ([place])   . Charcot's joint of foot due to diabetes ([place]) 09/18/2018  . BMI 36.0-36.9,adult   . Morbid obesity ([place]) 11/19/2017  . B12 deficiency 11/01/2017  . Diabetic foot ulcer ([place]) 12/14/2016  . Diabetic polyneuropathy associated with type 2 diabetes mellitus ([place]) 12/13/2015  . Lumbar stenosis with neurogenic claudication 09/02/2015  . Chronic constipation 07/13/2015  . OSA (obstructive sleep apnea) 06/25/2015  . 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/23/2015  . Hypertriglyceridemia 04/23/2015  . Asthma, mild intermittent 02/01/2015  . Type 2 diabetes mellitus with diabetic nephropathy ([place]) 12/31/2014  . Gastroesophageal reflux disease with esophagitis 12/31/2014  . Dysphagia 12/31/2014  . Bilateral carpal tunnel syndrome 12/01/2014  . Diverticulosis of colon (without mention of hemorrhage) 11/30/2012  . Essential hypertension, benign 11/28/2012    Patient's Medications  New Prescriptions   No medications on file  Previous Medications   ACETAMINOPHEN (TYLENOL) 500 MG TABLET    Take 2 tablets (1,000 mg total) by mouth every 6 (six) hours as needed for mild pain. Maximum of 3,000 mg per day   ALBUTEROL SULFATE (PROAIR RESPICLICK) 443 (90 BASE) MCG/ACT AEPB    Inhale 1-2 puffs into the lungs every 6 (six) hours as needed (Shortness of breath or wheezing).   ASPIRIN EC 325 MG TABLET    Take 1 tablet (325 mg total) by mouth daily.   BLOOD GLUCOSE MONITORING SUPPL (CONTOUR NEXT MONITOR) W/DEVICE KIT    as directed.    CEPHALEXIN (KEFLEX) 500 MG CAPSULE    Take 1 capsule (500 mg total)  by mouth 3 (three) times daily.   DICLOFENAC SODIUM (VOLTAREN) 1 % GEL    Apply 2 g topically 4 (four) times daily. Rub into affected area of foot 2 to 4 times daily   DULOXETINE (CYMBALTA) 60 MG CAPSULE    Take 60 mg by mouth at bedtime.   FENOFIBRATE (TRICOR) 145 MG TABLET    Take 145 mg by mouth daily.   GABAPENTIN (NEURONTIN) 400 MG CAPSULE    Take 800 mg by mouth 3 (three) times daily.    GLIMEPIRIDE (AMARYL) 2 MG TABLET    Take 2 mg by mouth 2 (two) times daily.    HYDROCHLOROTHIAZIDE 10 MG/ML SUSP    Take 0.63 mLs (6.25 mg total) by mouth daily.   INSULIN DEGLUDEC (TRESIBA FLEXTOUCH) 200 UNIT/ML SOPN    Inject 20 Units into the skin at bedtime.    METFORMIN (GLUCOPHAGE-XR) 750 MG 24 HR TABLET       MUPIROCIN OINTMENT (BACTROBAN) 2 %    Apply 1 application topically 2 (two) times daily.   PANTOPRAZOLE (PROTONIX) 40 MG TABLET    Take 1 tablet (40 mg total) by mouth daily.   ULTICARE SHORT PEN NEEDLES 31G X 8 MM MISC      Modified Medications   No medications on file  Discontinued Medications   No medications on file    Subjective: Olivia Romero is in for Olivia Romero routine follow-up visit.  Olivia Romero completed 10 weeks of antibiotic therapy for MRSA bacteremia and left foot infection  on 02/17/2019.  Olivia Romero recently developed a small scab on the proximal portion of Olivia Romero left foot incision and the scab peeled off.  Olivia Romero was seen yesterday by Olivia Romero podiatrist, Dr. Jacqualyn Posey.  He also noted a paronychia on the lateral aspect of Olivia Romero left great toenail.  It was incised and the nail was trimmed.  Olivia Romero was started on cephalexin.  Wound Gram stain and culture are pending.  Review of Systems: Review of Systems  Constitutional: Negative for fever.  Gastrointestinal: Negative for abdominal pain, diarrhea, nausea and vomiting.  Musculoskeletal: Negative for joint pain.    Past Medical History:  Diagnosis Date  . Anemia   . Arthritis   . Asthma   . Carpal tunnel syndrome on right   . Colon polyps    adenomatous  .  Diabetes mellitus without complication ([place])   . Diabetic infection of left foot ([place]) 12/2018  . Diverticulosis of colon   . Esophagitis   . GERD (gastroesophageal reflux disease)   . Hemorrhoid    internal  . Hyperlipemia   . Hypertension   . Myalgia due to statin 11/19/2017  . Neuropathy   . Pneumonia   . PONV (postoperative nausea and vomiting)   . Skin ulcer of right ankle, limited to breakdown of skin ([place]) 12/31/2016  . Sleep apnea    has had in the past lost 50 pounds and do longer uses cpap  . Statin intolerance 04/23/2015  . Stroke-like episode 2009   TIA  . 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 ([place]) 07/13/2015  . Uncontrolled type 2 diabetes mellitus with hyperglycemia ([place]) 11/01/2017    Social History   Tobacco Use  . Smoking status: Never Smoker  . Smokeless tobacco: Never Used  Substance Use Topics  . Alcohol use: Never    Frequency: Never  . Drug use: No    Family History  Problem Relation Age of Onset  . Lung cancer Father   . Hypertension Father   . Arthritis Father   . Other Mother        hardening of the arteries/renal cell carcenoma  . Hypertension Mother   . Stroke Mother   . Kidney cancer Mother   . Arthritis Brother   . Rheum arthritis Maternal Uncle   . Bladder Cancer Neg Hx     Allergies  Allergen Reactions  . Eggs Or Egg-Derived Products Swelling and Other (See Comments)    Angioedema  . Atorvastatin Other (See Comments)    Liver toxicity  . Flu Virus Vaccine Swelling    Arm swelled (site of injection)  . Latex Itching  . Pravastatin Itching and Rash  . Tape Rash and Other (See Comments)    TAPE PULLS OFF THE SKIN!!! Please use an alternative!!    Objective: Vitals:   04/08/19 0915  BP: 128/74  Pulse: 79  Temp: 98.6 F (37 C)   There is no height or weight on file to calculate BMI.  Physical Exam Constitutional:      Comments: Olivia Romero is pleasant and in good spirits.  Musculoskeletal:     Comments: Olivia Romero  has a small (less than dime sized) superficial ulcer on the lateral aspect of Olivia Romero left foot.  No odor or surrounding cellulitis.  Olivia Romero left great toe is wrapped.       Lab Results    Problem List Items Addressed This Visit      High   Osteomyelitis of ankle or foot, acute, left ([place])  I believe the osteomyelitis of Olivia Romero left foot has been cured through a combination of surgery and antibiotic therapy.  The small ulcer is not infected.  Olivia Romero is being followed by Dr. Jacqualyn Posey for the paronychia.  Olivia Romero can follow-up here as needed.          Michel Bickers, MD [facility] for [place] [facility] [phone] pager   [phone] cell 04/08/2019, 9:36 AM

## 2019-04-08 NOTE — Assessment & Plan Note (Signed)
I believe the osteomyelitis of her left foot has been cured through a combination of surgery and antibiotic therapy.  The small ulcer is not infected.  She is being followed by Dr. Jacqualyn Posey for the paronychia.  She can follow-up here as needed.

## 2019-04-10 ENCOUNTER — Telehealth: Payer: Self-pay | Admitting: Podiatry

## 2019-04-10 LAB — WOUND CULTURE
MICRO NUMBER:: 1055824
SPECIMEN QUALITY:: ADEQUATE

## 2019-04-10 NOTE — Telephone Encounter (Signed)
I informed pt [facility] generally contact pt to schedule for advanced imaging, but may not for x-rays. I informed pt she could schedule with [phone].

## 2019-04-10 NOTE — Telephone Encounter (Signed)
Patient called to say that shes suppose to hear from [facility] for an xray but no one has contacted her about that appt she is a [place] patient

## 2019-04-11 ENCOUNTER — Ambulatory Visit
Admission: RE | Admit: 2019-04-11 | Discharge: 2019-04-11 | Disposition: A | Discharge status: Home / Self Care | Payer: Medicare Other | Source: Ambulatory Visit | Attending: Podiatry | Admitting: Podiatry

## 2019-04-11 ENCOUNTER — Other Ambulatory Visit: Payer: Self-pay

## 2019-04-11 ENCOUNTER — Other Ambulatory Visit: Payer: Self-pay | Admitting: Podiatry

## 2019-04-11 ENCOUNTER — Telehealth: Payer: Self-pay | Admitting: *Deleted

## 2019-04-11 DIAGNOSIS — Z09 Encounter for follow-up examination after completed treatment for conditions other than malignant neoplasm: Secondary | ICD-10-CM | POA: Insufficient documentation

## 2019-04-11 DIAGNOSIS — M7989 Other specified soft tissue disorders: Secondary | ICD-10-CM | POA: Insufficient documentation

## 2019-04-11 DIAGNOSIS — L03116 Cellulitis of left lower limb: Secondary | ICD-10-CM | POA: Insufficient documentation

## 2019-04-11 DIAGNOSIS — M86272 Subacute osteomyelitis, left ankle and foot: Secondary | ICD-10-CM | POA: Diagnosis not present

## 2019-04-11 DIAGNOSIS — M14672 Charcot's joint, left ankle and foot: Secondary | ICD-10-CM

## 2019-04-11 MED ORDER — CLINDAMYCIN HCL 300 MG PO CAPS: 300.0000 mg | ORAL_CAPSULE | Freq: Three times a day (TID) | ORAL | 0 refills | Status: DC

## 2019-04-11 NOTE — Telephone Encounter (Signed)
I informed pt of Dr. Wagoner's review of results and orders. 

## 2019-04-11 NOTE — Telephone Encounter (Signed)
-----   Message from Trula Slade, DPM sent at 04/11/2019  7:19 AM EST ----- Val- let her know the culture did show MRSA. I have called in clindamycin for her. She can stop keflex.

## 2019-04-15 ENCOUNTER — Other Ambulatory Visit: Payer: Self-pay

## 2019-04-15 ENCOUNTER — Ambulatory Visit (INDEPENDENT_AMBULATORY_CARE_PROVIDER_SITE_OTHER): Payer: Medicare Other | Admitting: Podiatry

## 2019-04-15 ENCOUNTER — Ambulatory Visit: Payer: Medicare Other | Admitting: Podiatry

## 2019-04-15 DIAGNOSIS — E1142 Type 2 diabetes mellitus with diabetic polyneuropathy: Secondary | ICD-10-CM | POA: Diagnosis not present

## 2019-04-15 DIAGNOSIS — L02612 Cutaneous abscess of left foot: Secondary | ICD-10-CM

## 2019-04-15 DIAGNOSIS — M14672 Charcot's joint, left ankle and foot: Secondary | ICD-10-CM

## 2019-04-15 DIAGNOSIS — L97521 Non-pressure chronic ulcer of other part of left foot limited to breakdown of skin: Secondary | ICD-10-CM

## 2019-04-15 DIAGNOSIS — L03032 Cellulitis of left toe: Secondary | ICD-10-CM

## 2019-04-21 ENCOUNTER — Telehealth: Payer: Self-pay | Admitting: Podiatry

## 2019-04-21 DIAGNOSIS — Z01812 Encounter for preprocedural laboratory examination: Secondary | ICD-10-CM

## 2019-04-21 DIAGNOSIS — M86672 Other chronic osteomyelitis, left ankle and foot: Secondary | ICD-10-CM

## 2019-04-21 NOTE — Telephone Encounter (Signed)
Faxed CT orders for foot and ankle left with labs to [facility].

## 2019-04-21 NOTE — Telephone Encounter (Signed)
I informed pt of CT orders to be performed at [facility]. Pt states she will contact and schedule.

## 2019-04-21 NOTE — Addendum Note (Signed)
Addended by: Harriett Sine D on: 04/21/2019 04:53 PM   Modules accepted: Orders

## 2019-04-21 NOTE — Telephone Encounter (Signed)
Patient called to state that she was waiting on [facility] to set time to go for CT she still has not heard anything and [facility] does not have the order

## 2019-04-21 NOTE — Telephone Encounter (Signed)
Olivia Romero can you order a CT scan of her foot and ankle? This is for surgical planning. Has Charcot and history of osteomyelitis with previous 4th/5th ray amputaitons

## 2019-04-22 ENCOUNTER — Telehealth: Payer: Self-pay | Admitting: *Deleted

## 2019-04-22 NOTE — Telephone Encounter (Signed)
Pt states she is having a lot of bloody drainage from the foot Dr. Jacqualyn Posey is working on and wanted to know what she could do at home.

## 2019-04-22 NOTE — Telephone Encounter (Signed)
I called pt and she states the drainage came through the day before and then today was not so bad but looks more red than the day before, and the bottom area that he cut off is still leaking and wrapped with the gauze and the ace, but wasn't on the ace wrap.

## 2019-04-22 NOTE — Progress Notes (Signed)
Subjective: 63 year old female presents the office today for follow-up evaluation of left foot Charcot, ulcerations as well as an infected ingrown toenail.  She has been taking antibiotics but any side effects.  The wound to the lateral aspect of the foot is getting smaller.  She denies any increase in swelling or redness or any warmth of the foot. Denies any systemic complaints such as fevers, chills, nausea, vomiting. No acute changes since last appointment, and no other complaints at this time.   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  There is a superficial wound present along the proximal aspect of the incision along the cuboid.  Superficial wound still remains measuring possibly 1.2 x 0.5 cm and superficial with a granular wound base.  There is no probing, undermining or tunneling.  There is no surrounding erythema, ascending cellulitis.  No drainage or pus.  The left hallux toenail appears to be healed and there is currently no drainage or pus.  Some minimal erythema but overall improved.  No fluctuation crepitation. No pain with calf compression, swelling, warmth, erythema  Assessment: New ulceration left foot without signs of infection; abscess left hallux toenail with improvement  Plan: -All treatment options discussed with the patient including all alternatives, risks, complications.  -The toenail site is doing better.  Continue cleaning with soap and water and apply antibiotic ointment dressings.  Finish course of antibiotics. -We had a long discussion today regarding treatment options.  She is concerned that she still had a MRSA infection in her toe with her previous infection.  Will order CT scan of the foot and ankle for surgical plan as well as to rule out any underlying residual osteomyelitis.  We long discussion today in regards to the treatment options both amputation, below-knee oral  when tried to limb salvage with a reconstruction.  She is to consider her options.  Trula Slade DPM

## 2019-04-22 NOTE — Telephone Encounter (Signed)
Spoke with valery and patient will be put on antibiotic and also see Dr Posey Pronto tomorrow and Marcy Siren is calling the patient to let her know. [name]

## 2019-04-23 ENCOUNTER — Ambulatory Visit (INDEPENDENT_AMBULATORY_CARE_PROVIDER_SITE_OTHER): Payer: Medicare Other | Admitting: Podiatry

## 2019-04-23 ENCOUNTER — Other Ambulatory Visit: Payer: Self-pay

## 2019-04-23 ENCOUNTER — Other Ambulatory Visit: Payer: Self-pay | Admitting: Neurology

## 2019-04-23 DIAGNOSIS — L97521 Non-pressure chronic ulcer of other part of left foot limited to breakdown of skin: Secondary | ICD-10-CM | POA: Diagnosis not present

## 2019-04-23 DIAGNOSIS — E1149 Type 2 diabetes mellitus with other diabetic neurological complication: Secondary | ICD-10-CM

## 2019-04-23 DIAGNOSIS — M14672 Charcot's joint, left ankle and foot: Secondary | ICD-10-CM

## 2019-04-23 DIAGNOSIS — E1142 Type 2 diabetes mellitus with diabetic polyneuropathy: Secondary | ICD-10-CM

## 2019-04-24 ENCOUNTER — Other Ambulatory Visit: Payer: Self-pay

## 2019-04-24 ENCOUNTER — Ambulatory Visit
Admission: RE | Admit: 2019-04-24 | Discharge: 2019-04-24 | Disposition: A | Discharge status: Home / Self Care | Payer: Medicare Other | Source: Ambulatory Visit | Attending: Podiatry | Admitting: Podiatry

## 2019-04-24 DIAGNOSIS — Z01812 Encounter for preprocedural laboratory examination: Secondary | ICD-10-CM | POA: Diagnosis not present

## 2019-04-24 DIAGNOSIS — M86672 Other chronic osteomyelitis, left ankle and foot: Secondary | ICD-10-CM | POA: Diagnosis not present

## 2019-04-24 DIAGNOSIS — S91002A Unspecified open wound, left ankle, initial encounter: Secondary | ICD-10-CM | POA: Diagnosis not present

## 2019-04-24 MED ORDER — IOHEXOL 300 MG/ML  SOLN
50.0000 mL | Freq: Once | INTRAMUSCULAR | Status: AC | PRN
  Administered 2019-04-24: 75 mL via INTRAVENOUS

## 2019-04-25 ENCOUNTER — Encounter: Payer: Self-pay | Admitting: Podiatry

## 2019-04-25 NOTE — Progress Notes (Signed)
Subjective:  Patient ID: Olivia Romero, female    DOB: 1955/12/03,  MRN: [id number]  Chief Complaint  Patient presents with  . Wound Check    pt is here for a f/u on a wound check, pt is doing alot better since the last time she was here, pt shows no signs of infection, pt has also been keeping the wound nice and dry    63 y.o. female presents with the above complaint.  Patient presents with the wound on the lateral aspect of the left foot.  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  Patient is well-known to Dr. Earleen Newport who has been keeping out on the wound.  She states the wound is doing a lot better she has been doing local wound care herself.  She states that the pain infection is no longer present.  She has almost complete her antibiotic therapy.  She has been keeping her bandages clean dry and intact.  She denies any nausea fever chills vomiting.   Review of Systems: Negative except as noted in the HPI. Denies N/V/F/Ch.  Past Medical History:  Diagnosis Date  . Anemia   . Arthritis   . Asthma   . Carpal tunnel syndrome on right   . Colon polyps    adenomatous  . Diabetes mellitus without complication ([place])   . Diabetic infection of left foot ([place]) 12/2018  . Diverticulosis of colon   . Esophagitis   . GERD (gastroesophageal reflux disease)   . Hemorrhoid    internal  . Hyperlipemia   . Hypertension   . Myalgia due to statin 11/19/2017  . Neuropathy   . Pneumonia   . PONV (postoperative nausea and vomiting)   . Skin ulcer of right ankle, limited to breakdown of skin ([place]) 12/31/2016  . Sleep apnea    has had in the past lost 50 pounds and do longer uses cpap  . Statin intolerance 04/23/2015  . Stroke-like episode 2009   TIA  . 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 Uncontrolled type 2 diabetes mellitus with hyperglycemia ([place]) 11/01/2017    Current Outpatient Medications:  .  acetaminophen (TYLENOL) 500 MG  tablet, Take 2 tablets (1,000 mg total) by mouth every 6 (six) hours as needed for mild pain. Maximum of 3,000 mg per day, Disp: , Rfl:  .  Albuterol Sulfate (PROAIR RESPICLICK) 295 (90 Base) MCG/ACT AEPB, Inhale 1-2 puffs into the lungs every 6 (six) hours as needed (Shortness of breath or wheezing)., Disp: 1 each, Rfl: 2 .  aspirin EC 325 MG tablet, Take 1 tablet (325 mg total) by mouth daily., Disp: 30 tablet, Rfl: 0 .  Blood Glucose Monitoring Suppl (CONTOUR NEXT MONITOR) w/Device KIT, as directed. , Disp: , Rfl:  .  cephALEXin (KEFLEX) 500 MG capsule, Take 1 capsule (500 mg total) by mouth 3 (three) times daily., Disp: 21 capsule, Rfl: 0 .  clindamycin (CLEOCIN) 300 MG capsule, Take 1 capsule (300 mg total) by mouth 3 (three) times daily., Disp: 30 capsule, Rfl: 0 .  diclofenac sodium (VOLTAREN) 1 % GEL, Apply 2 g topically 4 (four) times daily. Rub into affected area of foot 2 to 4 times daily, Disp: 100 g, Rfl: 2 .  DULoxetine (CYMBALTA) 60 MG capsule, Take 60 mg by mouth at bedtime., Disp: , Rfl:  .  fenofibrate (TRICOR) 145 MG tablet, Take 145 mg by mouth daily., Disp: , Rfl: 10 .  gabapentin (NEURONTIN) 400 MG  capsule, Take 800 mg by mouth 3 (three) times daily. , Disp: , Rfl:  .  glimepiride (AMARYL) 2 MG tablet, Take 2 mg by mouth 2 (two) times daily. , Disp: , Rfl:  .  Insulin Degludec (TRESIBA FLEXTOUCH) 200 UNIT/ML SOPN, Inject 20 Units into the skin at bedtime. , Disp: , Rfl:  .  metFORMIN (GLUCOPHAGE-XR) 750 MG 24 hr tablet, , Disp: , Rfl:  .  mupirocin ointment (BACTROBAN) 2 %, Apply 1 application topically 2 (two) times daily., Disp: 30 g, Rfl: 2 .  oxyCODONE (OXY IR/ROXICODONE) 5 MG immediate release tablet, oxycodone 5 mg tablet, Disp: , Rfl:  .  pantoprazole (PROTONIX) 40 MG tablet, Take 1 tablet (40 mg total) by mouth daily., Disp: 90 tablet, Rfl: 1 .  ULTICARE SHORT PEN NEEDLES 31G X 8 MM MISC, , Disp: , Rfl:  .  hydrochlorothiazide 10 mg/mL SUSP, Take 0.63 mLs (6.25 mg  total) by mouth daily., Disp: 18.9 mL, Rfl: 0  Social History   Tobacco Use  Smoking Status Never Smoker  Smokeless Tobacco Never Used    Allergies  Allergen Reactions  . Eggs Or Egg-Derived Products Swelling and Other (See Comments)    Angioedema  . Atorvastatin Other (See Comments)    Liver toxicity  . Flu Virus Vaccine Swelling    Arm swelled (site of injection)  . Latex Itching  . Pravastatin Itching and Rash  . Tape Rash and Other (See Comments)    TAPE PULLS OFF THE SKIN!!! Please use an alternative!!   Objective:  There were no vitals filed for this visit. There is no height or weight on file to calculate BMI. Constitutional Well developed. Well nourished.  Vascular Dorsalis pedis pulses palpable bilaterally. Posterior tibial pulses palpable bilaterally. Capillary refill normal to all digits.  No cyanosis or clubbing noted. Pedal hair growth normal.  Neurologic Normal speech. Oriented to person, place, and time. Epicritic sensation to light touch grossly present bilaterally.  Dermatologic  left clubfoot deformity noted.  Left lateral 0.3 cm x 0.3 cm wound noted.  It is granular in nature without periwound maceration/hyperkeratotic skin.  It is considerably decreased in size.  No clinical signs of infection noted including malodor purulent drainage or crepitus.  Orthopedic: Normal joint ROM without pain or crepitus bilaterally. No visible deformities. No bony tenderness.   Radiographs: None Assessment:   1. Type II diabetes mellitus with neurological manifestations ([place])   2. Skin ulcer of left foot, limited to breakdown of skin ([place])   3. Charcot's joint of foot, left    Plan:  Patient was evaluated and treated and all questions answered.  Left lateral foot wound secondary to Charcot breakdown/congenital clubfoot deformity -The wound is considerably decreased in size.  Patient was worried that she was having some pain around the site so she wanted to make  sure to get looked at and rule out deep infection. -I explained to the patient that at this time there is no concern for infection as the wound stable and granular without clinical signs of infection. -I explained to the patient she can continue doing triple antibiotic/Neosporin and a Band-Aid to the wound until it heals up. -Patient will be seen by Dr. Jacqualyn Posey next week to assess the wound.  Return in about 1 week (around 04/30/2019).

## 2019-04-29 ENCOUNTER — Ambulatory Visit (INDEPENDENT_AMBULATORY_CARE_PROVIDER_SITE_OTHER): Payer: Medicare Other | Admitting: Podiatry

## 2019-04-29 ENCOUNTER — Other Ambulatory Visit: Payer: Self-pay

## 2019-04-29 DIAGNOSIS — L03032 Cellulitis of left toe: Secondary | ICD-10-CM | POA: Diagnosis not present

## 2019-04-29 DIAGNOSIS — L97521 Non-pressure chronic ulcer of other part of left foot limited to breakdown of skin: Secondary | ICD-10-CM

## 2019-04-29 DIAGNOSIS — M14672 Charcot's joint, left ankle and foot: Secondary | ICD-10-CM

## 2019-04-29 DIAGNOSIS — L02612 Cutaneous abscess of left foot: Secondary | ICD-10-CM | POA: Diagnosis not present

## 2019-04-29 DIAGNOSIS — E1149 Type 2 diabetes mellitus with other diabetic neurological complication: Secondary | ICD-10-CM

## 2019-04-29 NOTE — Progress Notes (Addendum)
Subjective: 63 year old female presents the office today for follow-up evaluation of left foot Charcot, ulceration to the lateral foot.  She states the wound is healing.  She wants to have a discussion in regards to the further treatment of her foot.  Had several discussions in regards to her foot.  Initially she developed wound to the fifth metatarsal base.  This has been an ongoing issue for quite some time.  She initially had surgery in order to try to help avoid pressure to the area and the wound.  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  Since undergoing ray resections and do the loss of the peroneal tendon she has continued to go into a cavus deformity.  At first it was flexible and discussed reconstruction once the infection she had resolved and after clearance of infectious disease.  However over the last couple of visits the foot is turned into more things becoming more rigid.  She wants to discuss treatment options including reconstruction as well as below-knee amputation.  We discussed amputation last appointment and after discussion with her family members she would like to proceed with this. Denies any systemic complaints such as fevers, chills, nausea, vomiting. No acute changes since last appointment, and no other complaints at this time.   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  There is a superficial wound present along the proximal aspect of the incision along the cuboid.  Superficial wound still remains measuring possibly 0.8 x 0.3 cm and superficial with a granular wound base.  There are some mild edema to the area but there is no erythema or warmth.  There is no fluctuation or crepitation.  There is no malodor. Toenail site is healed. No pain with calf compression,  swelling, warmth, erythema  MRI: IMPRESSION: 1. No evidence of active osteomyelitis. 2. Chronic arthritic changes of the ankle joint with a joint effusion. 3. a marked soft tissue thickening at the plantar lateral aspect of the foot at the calcaneocuboid articulation with a possible 12 mm abscess in the soft tissues.  Assessment: Healing ulceration left lateral foot with concern for abscess on MRI  Plan: -All treatment options discussed with the patient including all alternatives, risks, complications.  -The wound is doing better and clinically there is no active signs of infection.  However given the MRI did try to aspirate the area of concern for possible abscess was not able to identify any fluid.  As there is no clinical signs of infection we will hold off on antibiotics and continue monitor the area closely.  For now we will keep a small amount of antibiotic ointment and a dressing on the wound daily. -I had a long discussion today in regards to her treatment options.  We discussed trying to limb salvage her left leg versus below-knee amputation.  At this time she has had wounds on this left foot and will heal and then she will develop new ulcers.  She does not want to undergo a reconstruction at this point and have the risks. At this point her foot has quite a bit of deformity and is rigid.  She is ready to have a below-knee amputation in hopes that she will be able to move on from this with a better quality of life.  After  discussed that I cannot perform the surgery for her she was hesitant to see a different doctor. After we talked more she would like to proceed and see Dr. Sharol Given. I will put in a referral for him for surgical consultation.   Trula Slade DPM

## 2019-04-30 ENCOUNTER — Telehealth: Payer: Self-pay | Admitting: *Deleted

## 2019-04-30 DIAGNOSIS — M14672 Charcot's joint, left ankle and foot: Secondary | ICD-10-CM

## 2019-04-30 DIAGNOSIS — L97521 Non-pressure chronic ulcer of other part of left foot limited to breakdown of skin: Secondary | ICD-10-CM

## 2019-04-30 DIAGNOSIS — L03032 Cellulitis of left toe: Secondary | ICD-10-CM

## 2019-04-30 DIAGNOSIS — L02612 Cutaneous abscess of left foot: Secondary | ICD-10-CM

## 2019-04-30 DIAGNOSIS — E1149 Type 2 diabetes mellitus with other diabetic neurological complication: Secondary | ICD-10-CM

## 2019-04-30 DIAGNOSIS — M86672 Other chronic osteomyelitis, left ankle and foot: Secondary | ICD-10-CM

## 2019-04-30 NOTE — Telephone Encounter (Signed)
Faxed orders to Dr. Sharol Given.

## 2019-04-30 NOTE — Telephone Encounter (Signed)
-----   Message from Trula Slade, DPM sent at 04/29/2019  6:45 PM EST ----- Can you please put in a referral to see Dr. Sharol Given for surgery consult? thanks

## 2019-05-05 ENCOUNTER — Emergency Department (HOSPITAL_COMMUNITY): Payer: Medicare Other

## 2019-05-05 ENCOUNTER — Telehealth: Payer: Self-pay | Admitting: Podiatry

## 2019-05-05 ENCOUNTER — Emergency Department (HOSPITAL_COMMUNITY)
Admission: EM | Admit: 2019-05-05 | Discharge: 2019-05-05 | Disposition: A | Discharge status: Home / Self Care | Payer: Medicare Other | Attending: Emergency Medicine | Admitting: Emergency Medicine

## 2019-05-05 ENCOUNTER — Other Ambulatory Visit: Payer: Self-pay

## 2019-05-05 DIAGNOSIS — Y998 Other external cause status: Secondary | ICD-10-CM | POA: Insufficient documentation

## 2019-05-05 DIAGNOSIS — Z8673 Personal history of transient ischemic attack (TIA), and cerebral infarction without residual deficits: Secondary | ICD-10-CM | POA: Diagnosis not present

## 2019-05-05 DIAGNOSIS — I10 Essential (primary) hypertension: Secondary | ICD-10-CM | POA: Diagnosis not present

## 2019-05-05 DIAGNOSIS — S0003XA Contusion of scalp, initial encounter: Secondary | ICD-10-CM | POA: Diagnosis not present

## 2019-05-05 DIAGNOSIS — Y9301 Activity, walking, marching and hiking: Secondary | ICD-10-CM | POA: Diagnosis not present

## 2019-05-05 DIAGNOSIS — Z794 Long term (current) use of insulin: Secondary | ICD-10-CM | POA: Diagnosis not present

## 2019-05-05 DIAGNOSIS — Z91040 Latex allergy status: Secondary | ICD-10-CM | POA: Diagnosis not present

## 2019-05-05 DIAGNOSIS — Y92018 Other place in single-family (private) house as the place of occurrence of the external cause: Secondary | ICD-10-CM | POA: Diagnosis not present

## 2019-05-05 DIAGNOSIS — S199XXA Unspecified injury of neck, initial encounter: Secondary | ICD-10-CM | POA: Diagnosis not present

## 2019-05-05 DIAGNOSIS — E1165 Type 2 diabetes mellitus with hyperglycemia: Secondary | ICD-10-CM | POA: Insufficient documentation

## 2019-05-05 DIAGNOSIS — W19XXXA Unspecified fall, initial encounter: Secondary | ICD-10-CM

## 2019-05-05 DIAGNOSIS — W010XXA Fall on same level from slipping, tripping and stumbling without subsequent striking against object, initial encounter: Secondary | ICD-10-CM | POA: Diagnosis not present

## 2019-05-05 DIAGNOSIS — Z7982 Long term (current) use of aspirin: Secondary | ICD-10-CM | POA: Diagnosis not present

## 2019-05-05 DIAGNOSIS — S0990XA Unspecified injury of head, initial encounter: Secondary | ICD-10-CM | POA: Diagnosis present

## 2019-05-05 MED ORDER — ACETAMINOPHEN 500 MG PO TABS
1000.0000 mg | ORAL_TABLET | Freq: Once | ORAL | Status: AC
  Administered 2019-05-05: 1000 mg via ORAL
  Filled 2019-05-05: qty 2

## 2019-05-05 MED ORDER — ACETAMINOPHEN 500 MG PO TABS: 1000.0000 mg | ORAL_TABLET | Freq: Four times a day (QID) | ORAL | Status: DC | PRN

## 2019-05-05 MED ORDER — TETANUS-DIPHTH-ACELL PERTUSSIS 5-2.5-18.5 LF-MCG/0.5 IM SUSP: 0.5000 mL | Freq: Once | INTRAMUSCULAR | Status: DC

## 2019-05-05 NOTE — Telephone Encounter (Signed)
I called her and left VM to call if there was anything I could do for her.

## 2019-05-05 NOTE — ED Triage Notes (Signed)
Pt arrives via EMS from home with mechanical fall outside. Pt with abrasion to forehead. No blood thinners. Uses a walker at home, nonambulatory. Pt has neuropathy. Denies LOC. VSS. May have pressure sore to foot from fall.

## 2019-05-05 NOTE — ED Notes (Signed)
Discussed d/c instructions with pt. Pt understands instructions. Esignature pad not available.

## 2019-05-05 NOTE — Telephone Encounter (Signed)
pts relative Ronny Bacon called per pt and wanted to let you know her other leg gave out an pt fell and the ambulance is there now taking her to [facility]. Pt has busted her head open.

## 2019-05-05 NOTE — ED Provider Notes (Signed)
[facility] EMERGENCY DEPARTMENT Provider Note   CSN: [id number] Arrival date & time: 05/05/19  1317     History   Chief Complaint Chief Complaint  Patient presents with  . Fall    HPI Olivia Romero is a 63 y.o. female.     HPI  Pt is a 63 year old female with PMH of asthma, DM, HTN, HLD who presents to the ED after a fall. Patient reports she has Charcot right foot and typically ambulates with a walker.  She reports she was getting out of her car today to walk up her car into her home when she noticed a small stool that she wanted to carry in.  Patient states she took a few steps without her walker and felt herself falling down.  She reports she hit her head on the concrete but did not lose consciousness.  Patient caught herself with her right arm.  Patient endorses mild headache.  No vision changes.  No numbness, tingling, new weakness.  She reports she was in her normal state of health prior to this event.  She denies any chest pain or difficulty breathing.  No nausea vomiting or abdominal pain. She does not take any anticoagulation.   Past Medical History:  Diagnosis Date  . Anemia   . Arthritis   . Asthma   . Carpal tunnel syndrome on right   . Colon polyps    adenomatous  . Diabetes mellitus without complication ([place])   . Diabetic infection of left foot ([place]) 12/2018  . Diverticulosis of colon   . Esophagitis   . GERD (gastroesophageal reflux disease)   . Hemorrhoid    internal  . Hyperlipemia   . Hypertension   . Myalgia due to statin 11/19/2017  . Neuropathy   . Pneumonia   . PONV (postoperative nausea and vomiting)   . Skin ulcer of right ankle, limited to breakdown of skin ([place]) 12/31/2016  . Sleep apnea    has had in the past lost 50 pounds and do longer uses cpap  . Statin intolerance 04/23/2015  . Stroke-like episode 2009   TIA  . 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 Uncontrolled type 2 diabetes  mellitus with hyperglycemia ([place]) 11/01/2017    Patient Active Problem List   Diagnosis Date Noted  . Charcot's joint of foot, left   . Post-operative state   . PAD (peripheral artery disease) ([place]) 12/05/2018  . Osteomyelitis of ankle or foot, acute, left ([place]) 12/05/2018  . MRSA bacteremia 12/05/2018  . Sepsis without acute organ dysfunction ([place])   . Charcot's joint of foot due to diabetes ([place]) 09/18/2018  . BMI 36.0-36.9,adult   . Morbid obesity ([place]) 11/19/2017  . B12 deficiency 11/01/2017  . Diabetic foot ulcer ([place]) 12/14/2016  . Diabetic polyneuropathy associated with type 2 diabetes mellitus ([place]) 12/13/2015  . Lumbar stenosis with neurogenic claudication 09/02/2015  . Chronic constipation 07/13/2015  . OSA (obstructive sleep apnea) 06/25/2015  . 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/23/2015  . Hypertriglyceridemia 04/23/2015  . Asthma, mild intermittent 02/01/2015  . Type 2 diabetes mellitus with diabetic nephropathy ([place]) 12/31/2014  . Gastroesophageal reflux disease with esophagitis 12/31/2014  . Dysphagia 12/31/2014  . Bilateral carpal tunnel syndrome 12/01/2014  . Diverticulosis of colon (without mention of hemorrhage) 11/30/2012  . Essential hypertension, benign 11/28/2012    Past Surgical History:  Procedure Laterality Date  . ABDOMINAL HYSTERECTOMY    . AMPUTATION Left 12/06/2018  Procedure: PARTIAL AMPUTATION LEFT FOOT;  Surgeon: Edrick Kins, DPM;  Location: [facility];  Service: Podiatry;  Laterality: Left;  . BACK SURGERY  09/02/2015  . BONE BIOPSY Left 12/06/2018   Procedure: Bone Biopsy;  Surgeon: Edrick Kins, DPM;  Location: [facility];  Service: Podiatry;  Laterality: Left;  . CERVICAL FUSION    . CESAREAN SECTION  1986  . COLONOSCOPY N/A 11/30/2012   Procedure: COLONOSCOPY;  Surgeon: Irene Shipper, MD;  Location: [facility] ENDOSCOPY;  Service: Endoscopy;  Laterality: N/A;  . COLONOSCOPY WITH PROPOFOL N/A 01/03/2016   Procedure: COLONOSCOPY WITH  PROPOFOL;  Surgeon: Manya Silvas, MD;  Location: [facility] ENDOSCOPY;  Service: Endoscopy;  Laterality: N/A;  . ESOPHAGOGASTRODUODENOSCOPY (EGD) WITH PROPOFOL N/A 01/14/2015   Procedure: ESOPHAGOGASTRODUODENOSCOPY (EGD) WITH PROPOFOL;  Surgeon: Manya Silvas, MD;  Location: [facility] ENDOSCOPY;  Service: Endoscopy;  Laterality: N/A;  . IRRIGATION AND DEBRIDEMENT FOOT Left 03/13/2018   Procedure: IRRIGATION AND DEBRIDEMENT FOOT WITH BONE BIOPSY WITH MISONIX DEBRIDER;  Surgeon: Evelina Bucy, DPM;  Location: [facility];  Service: Podiatry;  Laterality: Left;  . IRRIGATION AND DEBRIDEMENT FOOT Left 12/06/2018   Procedure: Irrigation And Debridement Foot;  Surgeon: Edrick Kins, DPM;  Location: [place];  Service: Podiatry;  Laterality: Left;  . LUMBAR WOUND DEBRIDEMENT N/A 10/01/2015   Procedure: LUMBAR WOUND DEBRIDEMENT;  Surgeon: Ashok Pall, MD;  Location: [facility];  Service: Neurosurgery;  Laterality: N/A;  LUMBAR WOUND DEBRIDEMENT  . SAVORY DILATION N/A 01/14/2015   Procedure: SAVORY DILATION;  Surgeon: Manya Silvas, MD;  Location: [facility] ENDOSCOPY;  Service: Endoscopy;  Laterality: N/A;  . TONSILLECTOMY       OB History   No obstetric history on file.      Home Medications    Prior to Admission medications   Medication Sig Start Date End Date Taking? Authorizing Provider  acetaminophen (TYLENOL) 500 MG tablet Take 2 tablets (1,000 mg total) by mouth every 6 (six) hours as needed for mild pain. Maximum of 3,000 mg per day 09/17/18   Arnetha Courser, MD  Albuterol Sulfate (PROAIR RESPICLICK) 431 (90 Base) MCG/ACT AEPB Inhale 1-2 puffs into the lungs every 6 (six) hours as needed (Shortness of breath or wheezing). 06/18/18   [name], FNP  aspirin EC 325 MG tablet Take 1 tablet (325 mg total) by mouth daily. 03/11/18   Steele Sizer, MD  Blood Glucose Monitoring Suppl (CONTOUR NEXT MONITOR) w/Device KIT as directed.  06/29/17   [provider]  cephALEXin (KEFLEX) 500 MG capsule  Take 1 capsule (500 mg total) by mouth 3 (three) times daily. 04/07/19   Trula Slade, DPM  clindamycin (CLEOCIN) 300 MG capsule Take 1 capsule (300 mg total) by mouth 3 (three) times daily. 04/11/19   Trula Slade, DPM  diclofenac sodium (VOLTAREN) 1 % GEL Apply 2 g topically 4 (four) times daily. Rub into affected area of foot 2 to 4 times daily 10/11/18   Trula Slade, DPM  DULoxetine (CYMBALTA) 60 MG capsule Take 60 mg by mouth at bedtime. 10/31/16   [provider]  fenofibrate (TRICOR) 145 MG tablet Take 145 mg by mouth daily. 12/05/16   [provider]  gabapentin (NEURONTIN) 400 MG capsule Take 800 mg by mouth 3 (three) times daily.     [provider]  glimepiride (AMARYL) 2 MG tablet Take 2 mg by mouth 2 (two) times daily.  10/29/15   [provider]  hydrochlorothiazide  10 mg/mL SUSP Take 0.63 mLs (6.25 mg total) by mouth daily. 12/13/18 02/24/19  Sherene Sires, DO  Insulin Degludec (TRESIBA FLEXTOUCH) 200 UNIT/ML SOPN Inject 20 Units into the skin at bedtime.     [provider]  metFORMIN (GLUCOPHAGE-XR) 750 MG 24 hr tablet  01/01/19   [provider]  mupirocin ointment (BACTROBAN) 2 % Apply 1 application topically 2 (two) times daily. 04/07/19   Trula Slade, DPM  oxyCODONE (OXY IR/ROXICODONE) 5 MG immediate release tablet oxycodone 5 mg tablet    [provider]  pantoprazole (PROTONIX) 40 MG tablet Take 1 tablet (40 mg total) by mouth daily. 06/03/18   [name], FNP  ULTICARE SHORT PEN NEEDLES 31G X 8 MM MISC  01/09/19   [provider]    Family History Family History  Problem Relation Age of Onset  . Lung cancer Father   . Hypertension Father   . Arthritis Father   . Other Mother        hardening of the arteries/renal cell carcenoma  . Hypertension Mother   . Stroke Mother   . Kidney cancer Mother   . Arthritis Brother   . Rheum arthritis Maternal Uncle   . Bladder Cancer Neg Hx      Social History Social History   Tobacco Use  . Smoking status: Never Smoker  . Smokeless tobacco: Never Used  Substance Use Topics  . Alcohol use: Never    Frequency: Never  . Drug use: No     Allergies   Eggs or egg-derived products, Atorvastatin, Flu virus vaccine, Latex, Pravastatin, and Tape   Review of Systems Review of Systems  Constitutional: Negative for chills and fever.  HENT: Negative for congestion.   Respiratory: Negative for cough and shortness of breath.   Cardiovascular: Negative for chest pain.  Gastrointestinal: Negative for abdominal pain, nausea and vomiting.  Musculoskeletal: Negative for back pain and neck pain.  Skin: Positive for wound.  Neurological: Positive for headaches. Negative for syncope and weakness.  Psychiatric/Behavioral: Negative for agitation and behavioral problems.     Physical Exam Updated Vital Signs BP (!) 120/99   Pulse 75   Temp 99 F (37.2 C) (Oral)   Resp 16   SpO2 98%   Physical Exam Vitals signs and nursing note reviewed.  Constitutional:      General: She is not in acute distress.    Appearance: She is well-developed.  HENT:     Head: Normocephalic.     Comments: R forehead contusion and overlying hemostatic abrasion     Mouth/Throat:     Mouth: Mucous membranes are moist.     Pharynx: Oropharynx is clear.  Eyes:     Extraocular Movements: Extraocular movements intact.     Conjunctiva/sclera: Conjunctivae normal.     Pupils: Pupils are equal, round, and reactive to light.  Neck:     Musculoskeletal: Neck supple. No muscular tenderness (No C/T/L spine TTP).  Cardiovascular:     Rate and Rhythm: Normal rate and regular rhythm.     Heart sounds: No murmur.  Pulmonary:     Effort: Pulmonary effort is normal. No respiratory distress.     Breath sounds: Normal breath sounds.  Abdominal:     Palpations: Abdomen is soft.     Tenderness: There is no abdominal tenderness.  Musculoskeletal: Normal range of  motion.        General: No swelling, tenderness or signs of injury.     Comments:  Superficial abrasion to R 5th digit; FROM  Skin:    General: Skin is warm and dry.     Findings: Lesion present.  Neurological:     General: No focal deficit present.     Mental Status: She is alert and oriented to person, place, and time.     Motor: Weakness (4/5 groos strength of BLE, at baseline, 5/5 plantarflexion) present.  Psychiatric:        Mood and Affect: Mood normal.        Behavior: Behavior normal.      ED Treatments / Results  Labs (all labs ordered are listed, but only abnormal results are displayed) Labs Reviewed - No data to display  EKG None  Radiology Ct Head Wo Contrast  Result Date: 05/05/2019 CLINICAL DATA:  Fall EXAM: CT HEAD WITHOUT CONTRAST TECHNIQUE: Contiguous axial images were obtained from the base of the skull through the vertex without intravenous contrast. COMPARISON:  August 14, 2014 FINDINGS: Brain: There is no acute intracranial hemorrhage, mass-effect, or edema. Gray-white differentiation is preserved. There is no extra-axial fluid collection. Ventricles and sulci are within normal limits in size and configuration. Vascular: There is mild atherosclerotic calcification at the skull base. Skull: Calvarium is unremarkable. Sinuses/Orbits: No acute finding. Other: Right anterior frontal scalp hematoma. IMPRESSION: No evidence of acute intracranial injury. Electronically Signed   By: Macy Mis M.D.   On: 05/05/2019 15:36   Ct Cervical Spine Wo Contrast  Result Date: 05/05/2019 CLINICAL DATA:  Fall EXAM: CT CERVICAL SPINE WITHOUT CONTRAST TECHNIQUE: Multidetector CT imaging of the cervical spine was performed without intravenous contrast. Multiplanar CT image reconstructions were also generated. COMPARISON:  None. FINDINGS: Alignment: Non-specific straightening of the cervical lordosis. There is mild anterolisthesis at C4-C5. Skull base and vertebrae: Postsurgical  changes of anterior fusion at C5-C7 with plate and screw fixation and well incorporated interbody grafts. Hardware is intact. There is no acute fracture identified. Vertebral body heights are maintained. Soft tissues and spinal canal: No prevertebral fluid or swelling. No visible canal hematoma. Disc levels: Multilevel degenerative changes are present including facet and uncovertebral hypertrophy. Bridging bone endplate osteophytes are present at the operative levels. Upper chest: No apical lung mass. Other: None. IMPRESSION: No acute cervical spine fracture. Electronically Signed   By: Macy Mis M.D.   On: 05/05/2019 15:45    Procedures Procedures (including critical care time)  Medications Ordered in ED Medications  Tdap (BOOSTRIX) injection 0.5 mL (0.5 mLs Intramuscular Not Given 05/05/19 1609)  acetaminophen (TYLENOL) tablet 1,000 mg (1,000 mg Oral Given 05/05/19 1614)     Initial Impression / Assessment and Plan / ED Course  I have reviewed the triage vital signs and the nursing notes.  Pertinent labs & imaging results that were available during my care of the patient were reviewed by me and considered in my medical decision making (see chart for details).        On arrival, patient is afebrile, hemodynamically stable.  Patient has evidence of a contusion and overlying superficial hemostatic abrasion to her right forehead.  She is alert and oriented without gross neuro deficits.  CT head and CT C-spine obtained without acute findings.  Specifically, no evidence of skull fracture or intracranial bleeding/abnormality.  Patient is at her neurologic baseline. Tdap updated for facial abrasion.  No underlying lacerations.  Overall, concern for mechanical ground-level fall.  Patient given Tylenol for mild headache and endorses improvement upon reassessment.  She was counseled on concussive type symptoms and  close PCP follow-up.  She endorses understanding.  Stable for discharge at this  time.  Strict return precautions given.  Final Clinical Impressions(s) / ED Diagnoses   Final diagnoses:  Fall, initial encounter  Contusion of scalp, initial encounter    ED Discharge Orders    None       Burns Spain, MD 05/05/19 1834    Tegeler, Gwenyth Allegra, MD 05/06/19 1100

## 2019-05-06 ENCOUNTER — Ambulatory Visit: Payer: Medicare Other

## 2019-05-06 NOTE — Telephone Encounter (Signed)
Pt called this morning returning Dr.Wagoner's call. She wanted to let him know that they checked her foot at the hospital while she was there and everything seems to be fine and nothing has opened up on her foot.

## 2019-05-07 ENCOUNTER — Encounter: Payer: Self-pay | Admitting: Family Medicine

## 2019-05-07 ENCOUNTER — Ambulatory Visit (INDEPENDENT_AMBULATORY_CARE_PROVIDER_SITE_OTHER): Payer: Medicare Other | Admitting: Family Medicine

## 2019-05-07 ENCOUNTER — Other Ambulatory Visit: Payer: Self-pay

## 2019-05-07 DIAGNOSIS — S0083XD Contusion of other part of head, subsequent encounter: Secondary | ICD-10-CM

## 2019-05-07 DIAGNOSIS — W19XXXD Unspecified fall, subsequent encounter: Secondary | ICD-10-CM | POA: Diagnosis not present

## 2019-05-07 NOTE — Progress Notes (Signed)
Name: Olivia Romero   MRN: [id number]    DOB: 11-13-1955   Date:05/07/2019       Progress Note  Subjective  Chief Complaint  Chief Complaint  Patient presents with  . Concussion    follow up ER  . Fall    face first on cinder blocks  . Black Eye    Right, completely shut after fall    I connected with  [name]  on 05/07/19 at  8:20 AM EST by a video enabled telemedicine application and verified that I am speaking with the correct person using two identifiers.  I discussed the limitations of evaluation and management by telemedicine and the availability of in person appointments. The patient expressed understanding and agreed to proceed. Staff also discussed with the patient that there may be a patient responsible charge related to this service. Patient Location: Home Provider Location: Home Office Additional Individuals present: None  HPI  Pt presents for ER follow up - she had a fall and was seen in the ER 05/05/2019.  She has Charcot right foot and usually walks with walker, however she got out of the car to walk into her home, tried to pick up a stool that she wanted to carry in, and lost her balance due to ambulating without her walker.  She did hit her head, denies LOC.  She had CT head and CT C-spine both of which were negative.    Today she is feeling okay - very sore.  Her hands are very sore, right arm is sore (caught herself with her right arm).  Headache is a dull ache that is much better than at initial fall.  She is concerned about her right eye - it swelled upon waking yesterday morning (1 day after the fall) - is able to open just a bit, states vision is a bit blurry, but overall is able to see out of the right eye.  She has had some mild photosensitivity since the onset of her fall - no worsening of this. Her daughter in law is able to check her pupils.  Pt has been taking Tylenol around the clock, and is applying ice to right eye and right arm.       Patient Active Problem List   Diagnosis Date Noted  . Charcot's joint of foot, left   . Post-operative state   . PAD (peripheral artery disease) ([place]) 12/05/2018  . Osteomyelitis of ankle or foot, acute, left ([place]) 12/05/2018  . MRSA bacteremia 12/05/2018  . Sepsis without acute organ dysfunction ([place])   . Charcot's joint of foot due to diabetes ([place]) 09/18/2018  . BMI 36.0-36.9,adult   . Morbid obesity ([place]) 11/19/2017  . B12 deficiency 11/01/2017  . Diabetic foot ulcer ([place]) 12/14/2016  . Diabetic polyneuropathy associated with type 2 diabetes mellitus ([place]) 12/13/2015  . Lumbar stenosis with neurogenic claudication 09/02/2015  . Chronic constipation 07/13/2015  . OSA (obstructive sleep apnea) 06/25/2015  . 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/23/2015  . Hypertriglyceridemia 04/23/2015  . Asthma, mild intermittent 02/01/2015  . Type 2 diabetes mellitus with diabetic nephropathy ([place]) 12/31/2014  . Gastroesophageal reflux disease with esophagitis 12/31/2014  . Dysphagia 12/31/2014  . Bilateral carpal tunnel syndrome 12/01/2014  . Diverticulosis of colon (without mention of hemorrhage) 11/30/2012  . Essential hypertension, benign 11/28/2012    Social History   Tobacco Use  . Smoking status: Never Smoker  . Smokeless tobacco: Never Used  Substance Use Topics  . Alcohol  use: Never    Frequency: Never     Current Outpatient Medications:  .  acetaminophen (TYLENOL) 500 MG tablet, Take 2 tablets (1,000 mg total) by mouth every 6 (six) hours as needed for mild pain. Maximum of 3,000 mg per day, Disp: , Rfl:  .  Albuterol Sulfate (PROAIR RESPICLICK) 654 (90 Base) MCG/ACT AEPB, Inhale 1-2 puffs into the lungs every 6 (six) hours as needed (Shortness of breath or wheezing)., Disp: 1 each, Rfl: 2 .  aspirin EC 325 MG tablet, Take 1 tablet (325 mg total) by mouth daily., Disp: 30 tablet, Rfl: 0 .  Blood Glucose Monitoring Suppl (CONTOUR NEXT MONITOR) w/Device KIT, as  directed. , Disp: , Rfl:  .  cephALEXin (KEFLEX) 500 MG capsule, Take 1 capsule (500 mg total) by mouth 3 (three) times daily., Disp: 21 capsule, Rfl: 0 .  clindamycin (CLEOCIN) 300 MG capsule, Take 1 capsule (300 mg total) by mouth 3 (three) times daily., Disp: 30 capsule, Rfl: 0 .  diclofenac sodium (VOLTAREN) 1 % GEL, Apply 2 g topically 4 (four) times daily. Rub into affected area of foot 2 to 4 times daily, Disp: 100 g, Rfl: 2 .  DULoxetine (CYMBALTA) 60 MG capsule, Take 60 mg by mouth at bedtime., Disp: , Rfl:  .  fenofibrate (TRICOR) 145 MG tablet, Take 145 mg by mouth daily., Disp: , Rfl: 10 .  gabapentin (NEURONTIN) 400 MG capsule, Take 800 mg by mouth 3 (three) times daily. , Disp: , Rfl:  .  glimepiride (AMARYL) 2 MG tablet, Take 2 mg by mouth 2 (two) times daily. , Disp: , Rfl:  .  Insulin Degludec (TRESIBA FLEXTOUCH) 200 UNIT/ML SOPN, Inject 20 Units into the skin at bedtime. , Disp: , Rfl:  .  metFORMIN (GLUCOPHAGE-XR) 750 MG 24 hr tablet, , Disp: , Rfl:  .  mupirocin ointment (BACTROBAN) 2 %, Apply 1 application topically 2 (two) times daily., Disp: 30 g, Rfl: 2 .  pantoprazole (PROTONIX) 40 MG tablet, Take 1 tablet (40 mg total) by mouth daily., Disp: 90 tablet, Rfl: 1 .  ULTICARE SHORT PEN NEEDLES 31G X 8 MM MISC, , Disp: , Rfl:  .  hydrochlorothiazide 10 mg/mL SUSP, Take 0.63 mLs (6.25 mg total) by mouth daily., Disp: 18.9 mL, Rfl: 0 .  oxyCODONE (OXY IR/ROXICODONE) 5 MG immediate release tablet, oxycodone 5 mg tablet, Disp: , Rfl:   Allergies  Allergen Reactions  . Eggs Or Egg-Derived Products Swelling and Other (See Comments)    Angioedema  . Atorvastatin Other (See Comments)    Liver toxicity  . Flu Virus Vaccine Swelling    Arm swelled (site of injection)  . Latex Itching  . Pravastatin Itching and Rash  . Tape Rash and Other (See Comments)    TAPE PULLS OFF THE SKIN!!! Please use an alternative!!    I personally reviewed active problem list, medication list,  allergies, notes from last encounter, lab results with the patient/caregiver today.  ROS  Ten systems reviewed and is negative except as mentioned in HPI   Objective  Virtual encounter, vitals not obtained.  There is no height or weight on file to calculate BMI.  Nursing Note and Vital Signs reviewed.  Physical Exam  Constitutional: Patient appears well-developed and well-nourished. No distress.  HENT: Head: Normocephalic.  See skin assessment regarding traumatic injuries.  Neck: Normal range of motion. Pulmonary/Chest: Effort normal. No respiratory distress. Speaking in complete sentences Neurological: Pt is alert and oriented to person, place, and time.  Coordination, speech are normal. No facial droop. Psychiatric: Patient has a normal mood and affect. behavior is normal. Judgment and thought content normal. Skin: No racoon eyes sign or battle sign noted on visual inspection.  There is right forehead contusion with overlying abrasion, right eye is edematous with ecchymosis of the upper lid, there is also a very small area of ecchymosis to the upper inner left eyelid.    No results found for this or any previous visit (from the past 72 hour(s)).  Assessment & Plan  1. Fall, subsequent encounter - Discussed her symptoms at length, discussed re-imaging CT head at this point, and she declines for now, will call back if anything changes - if worsening headache, vision changes, worsening swelling, worsening bruising, confusion, weakness/numbness/tingling, facial droop, unequal pupils or pupil that does not contract with light.  I discussed these red flags in great detail with her and she verbalizes understanding.  Overall her pain is controlled with Tylenol.  She does not appear to have any neurologic changes aside from blurred vision which is more mechanical from swelling to the right eye.  Follow up with our clinic in 1-2 days if needed, if any red flags as above, she will go directly back  to ER.  Apply ice to facial injuries and to right arm, Tylenol PRN.  - I discussed the assessment and treatment plan with the patient. The patient was provided an opportunity to ask questions and all were answered. The patient agreed with the plan and demonstrated an understanding of the instructions.  I provided 16 minutes of non-face-to-face time during this encounter.  [name], FNP

## 2019-05-08 ENCOUNTER — Telehealth: Payer: Self-pay | Admitting: Emergency Medicine

## 2019-05-08 NOTE — Telephone Encounter (Signed)
Documentation reviewed 

## 2019-05-08 NOTE — Telephone Encounter (Signed)
Called patient to see how she is doing. Patient stated she is still very sore but she is able to open her eye today.

## 2019-05-09 ENCOUNTER — Ambulatory Visit (INDEPENDENT_AMBULATORY_CARE_PROVIDER_SITE_OTHER): Payer: Medicare Other | Admitting: Family Medicine

## 2019-05-09 ENCOUNTER — Encounter: Payer: Self-pay | Admitting: Family Medicine

## 2019-05-09 ENCOUNTER — Other Ambulatory Visit: Payer: Self-pay

## 2019-05-09 ENCOUNTER — Telehealth: Payer: Self-pay | Admitting: Emergency Medicine

## 2019-05-09 DIAGNOSIS — L03019 Cellulitis of unspecified finger: Secondary | ICD-10-CM

## 2019-05-09 DIAGNOSIS — Z8614 Personal history of Methicillin resistant Staphylococcus aureus infection: Secondary | ICD-10-CM | POA: Diagnosis not present

## 2019-05-09 MED ORDER — SULFAMETHOXAZOLE-TRIMETHOPRIM 800-160 MG PO TABS: 1.0000 | ORAL_TABLET | Freq: Two times a day (BID) | ORAL | 0 refills | Status: DC

## 2019-05-09 NOTE — Telephone Encounter (Signed)
Appointment today

## 2019-05-09 NOTE — Progress Notes (Signed)
Name: Olivia Romero   MRN: [id number]    DOB: 1955-09-10   Date:05/09/2019       Progress Note  Subjective  Chief Complaint  Chief Complaint  Patient presents with  . Hand Pain    pinky finger right hand injury green pus    I connected with  [name]  on 05/09/19 at  2:40 PM EST by a video enabled telemedicine application and verified that I am speaking with the correct person using two identifiers.  I discussed the limitations of evaluation and management by telemedicine and the availability of in person appointments. The patient expressed understanding and agreed to proceed. Staff also discussed with the patient that there may be a patient responsible charge related to this service. Patient Location: Home Provider Location: Office Additional Individuals present: None  HPI  Pt presents to follow up on recent fall.  She has bruising on her right eye and forehead and small amount above the left eye which was addressed a few days ago at our ER follow up and seems to be improving.  She also notes that she has 2 small open wounds on the right 5th finger - please see MyChart message for photos.  She is concerned that this could potentially be infected because there is some green thick drainage coming from both sides.  She also notes history of MRSA.  She has bactroban ointment that is newer and she is able to use on this.  No fevers/chills, no vision changes, headache, no joint swelling or red streaking up the hand or arm.  Interim history from 06/02/2019 visit: - Pt presents for ER follow up - she had a fall and was seen in the ER 05/05/2019.  She has Charcot right foot and usually walks with walker, however she got out of the car to walk into her home, tried to pick up a stool that she wanted to carry in, and lost her balance due to ambulating without her walker.  She did hit her head, denies LOC.  She had CT head and CT C-spine both of which were negative.   - Today she is  feeling okay - very sore.  Her hands are very sore, right arm is sore (caught herself with her right arm).  Headache is a dull ache that is much better than at initial fall.  She is concerned about her right eye - it swelled upon waking yesterday morning (1 day after the fall) - is able to open just a bit, states vision is a bit blurry, but overall is able to see out of the right eye.  She has had some mild photosensitivity since the onset of her fall - no worsening of this. Her daughter in law is able to check her pupils.  Pt has been taking Tylenol around the clock, and is applying ice to right eye and right arm.   Patient Active Problem List   Diagnosis Date Noted  . Charcot's joint of foot, left   . Post-operative state   . PAD (peripheral artery disease) ([place]) 12/05/2018  . Osteomyelitis of ankle or foot, acute, left ([place]) 12/05/2018  . MRSA bacteremia 12/05/2018  . Sepsis without acute organ dysfunction ([place])   . Charcot's joint of foot due to diabetes ([place]) 09/18/2018  . BMI 36.0-36.9,adult   . Morbid obesity ([place]) 11/19/2017  . B12 deficiency 11/01/2017  . Diabetic foot ulcer ([place]) 12/14/2016  . Diabetic polyneuropathy associated with type 2 diabetes mellitus ([place]) 12/13/2015  .  Lumbar stenosis with neurogenic claudication 09/02/2015  . Chronic constipation 07/13/2015  . OSA (obstructive sleep apnea) 06/25/2015  . 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/23/2015  . Hypertriglyceridemia 04/23/2015  . Asthma, mild intermittent 02/01/2015  . Type 2 diabetes mellitus with diabetic nephropathy ([place]) 12/31/2014  . Gastroesophageal reflux disease with esophagitis 12/31/2014  . Dysphagia 12/31/2014  . Bilateral carpal tunnel syndrome 12/01/2014  . Diverticulosis of colon (without mention of hemorrhage) 11/30/2012  . Essential hypertension, benign 11/28/2012    Social History   Tobacco Use  . Smoking status: Never Smoker  . Smokeless tobacco: Never Used  Substance Use  Topics  . Alcohol use: Never    Frequency: Never     Current Outpatient Medications:  .  acetaminophen (TYLENOL) 500 MG tablet, Take 2 tablets (1,000 mg total) by mouth every 6 (six) hours as needed for mild pain. Maximum of 3,000 mg per day, Disp: , Rfl:  .  Albuterol Sulfate (PROAIR RESPICLICK) 416 (90 Base) MCG/ACT AEPB, Inhale 1-2 puffs into the lungs every 6 (six) hours as needed (Shortness of breath or wheezing)., Disp: 1 each, Rfl: 2 .  aspirin EC 325 MG tablet, Take 1 tablet (325 mg total) by mouth daily., Disp: 30 tablet, Rfl: 0 .  Blood Glucose Monitoring Suppl (CONTOUR NEXT MONITOR) w/Device KIT, as directed. , Disp: , Rfl:  .  cephALEXin (KEFLEX) 500 MG capsule, Take 1 capsule (500 mg total) by mouth 3 (three) times daily., Disp: 21 capsule, Rfl: 0 .  clindamycin (CLEOCIN) 300 MG capsule, Take 1 capsule (300 mg total) by mouth 3 (three) times daily., Disp: 30 capsule, Rfl: 0 .  diclofenac sodium (VOLTAREN) 1 % GEL, Apply 2 g topically 4 (four) times daily. Rub into affected area of foot 2 to 4 times daily, Disp: 100 g, Rfl: 2 .  DULoxetine (CYMBALTA) 60 MG capsule, Take 60 mg by mouth at bedtime., Disp: , Rfl:  .  fenofibrate (TRICOR) 145 MG tablet, Take 145 mg by mouth daily., Disp: , Rfl: 10 .  gabapentin (NEURONTIN) 400 MG capsule, Take 800 mg by mouth 3 (three) times daily. , Disp: , Rfl:  .  glimepiride (AMARYL) 2 MG tablet, Take 2 mg by mouth 2 (two) times daily. , Disp: , Rfl:  .  Insulin Degludec (TRESIBA FLEXTOUCH) 200 UNIT/ML SOPN, Inject 20 Units into the skin at bedtime. , Disp: , Rfl:  .  metFORMIN (GLUCOPHAGE-XR) 750 MG 24 hr tablet, , Disp: , Rfl:  .  mupirocin ointment (BACTROBAN) 2 %, Apply 1 application topically 2 (two) times daily., Disp: 30 g, Rfl: 2 .  pantoprazole (PROTONIX) 40 MG tablet, Take 1 tablet (40 mg total) by mouth daily., Disp: 90 tablet, Rfl: 1 .  ULTICARE SHORT PEN NEEDLES 31G X 8 MM MISC, , Disp: , Rfl:  .  hydrochlorothiazide 10 mg/mL SUSP,  Take 0.63 mLs (6.25 mg total) by mouth daily., Disp: 18.9 mL, Rfl: 0 .  oxyCODONE (OXY IR/ROXICODONE) 5 MG immediate release tablet, oxycodone 5 mg tablet, Disp: , Rfl:   Allergies  Allergen Reactions  . Eggs Or Egg-Derived Products Swelling and Other (See Comments)    Angioedema  . Atorvastatin Other (See Comments)    Liver toxicity  . Flu Virus Vaccine Swelling    Arm swelled (site of injection)  . Latex Itching  . Pravastatin Itching and Rash  . Tape Rash and Other (See Comments)    TAPE PULLS OFF THE SKIN!!! Please use an alternative!!    I  personally reviewed active problem list, medication list, allergies, notes from last encounter with the patient/caregiver today.  ROS  Ten systems reviewed and is negative except as mentioned in HPI  Objective  Virtual encounter, vitals not obtained.  There is no height or weight on file to calculate BMI.  Nursing Note and Vital Signs reviewed.  Physical Exam  Constitutional: Patient appears well-developed and well-nourished. No distress.  HENT: Head: Normocephalic and atraumatic. Ecchymosis and edema around right and left eyes has significantly improved and faded.  Able to open eye completely. Neck: Normal range of motion. Pulmonary/Chest: Effort normal. No respiratory distress. Speaking in complete sentences Neurological: Pt is alert and oriented to person, place, and time. Coordination, speech and gait are normal.  Psychiatric: Patient has a normal mood and affect. behavior is normal. Judgment and thought content normal. Skin: See MyChart Message.  There are two small lesions to the anterior right 5th finger that have a green moist wound bed.   No results found for this or any previous visit (from the past 72 hour(s)).  Assessment & Plan 1. Cellulitis of little finger - sulfamethoxazole-trimethoprim (BACTRIM DS) 800-160 MG tablet; Take 1 tablet by mouth 2 (two) times daily for 7 days.  Dispense: 14 tablet; Refill: 0  2.  History of MRSA infection - She will apply bactroban ointment BID to the affected area for 14 days.  - sulfamethoxazole-trimethoprim (BACTRIM DS) 800-160 MG tablet; Take 1 tablet by mouth 2 (two) times daily for 7 days.  Dispense: 14 tablet; Refill: 0  -Red flags and when to present for emergency care or RTC including fever >101.63F, chest pain, shortness of breath, new/worsening/un-resolving symptoms, reviewed with patient at time of visit. Follow up and care instructions discussed and provided in AVS. - I discussed the assessment and treatment plan with the patient. The patient was provided an opportunity to ask questions and all were answered. The patient agreed with the plan and demonstrated an understanding of the instructions.  I provided 13 minutes of non-face-to-face time during this encounter.  [name], FNP

## 2019-05-09 NOTE — Telephone Encounter (Signed)
Needs to do a brief video visit - need to be able to see hand clearly, so please do send photos via mychart.  Work her in at the end of the day today please.

## 2019-05-09 NOTE — Telephone Encounter (Signed)
Still having right hand pain. The pinky finger has green pus. Keeping it clean with peroxide. Please advise. Patient is trying to upload a picture to [facility]

## 2019-05-09 NOTE — Telephone Encounter (Signed)
Copied from [place] [id number]. Topic: General - Other >> May 09, 2019  9:40 AM Reyne Dumas L wrote: Reason for CRM:   Pt states she is supposed to call and speak with Bonnita Nasuti about a fall earlier in the week.  Tried office, no answer.

## 2019-05-12 ENCOUNTER — Other Ambulatory Visit: Payer: Self-pay | Admitting: Physician Assistant

## 2019-05-12 ENCOUNTER — Ambulatory Visit (INDEPENDENT_AMBULATORY_CARE_PROVIDER_SITE_OTHER): Payer: Medicare Other | Admitting: Orthopedic Surgery

## 2019-05-12 ENCOUNTER — Encounter: Payer: Self-pay | Admitting: Orthopedic Surgery

## 2019-05-12 VITALS — Ht 60.0 in | Wt 191.0 lb

## 2019-05-12 DIAGNOSIS — M21962 Unspecified acquired deformity of left lower leg: Secondary | ICD-10-CM

## 2019-05-12 DIAGNOSIS — E11610 Type 2 diabetes mellitus with diabetic neuropathic arthropathy: Secondary | ICD-10-CM

## 2019-05-12 DIAGNOSIS — L02612 Cutaneous abscess of left foot: Secondary | ICD-10-CM | POA: Diagnosis not present

## 2019-05-12 NOTE — Progress Notes (Signed)
Office Visit Note   Patient: Olivia Romero           Date of Birth: 1955-09-14           MRN: [id number] Visit Date: 05/12/2019              Requested by: [name], [place],  [place] [address] PCP: [name], FNP  Chief Complaint  Patient presents with  . Left Foot - Pain      HPI: Patient is a 63 year old woman with diabetic insensate neuropathy she has had multiple infections of her left foot and has undergone excellent foot salvage intervention she has had fourth and fifth ray amputations left foot with partial cuboid excision.  Patient has had a MRSA infection of the great toe which is also been debrided.  Patient is status post a CT scan which shows fluid in the calcaneocuboid region consistent with an abscess.  Assessment & Plan: Visit Diagnoses:  1. Charcot's joint of foot due to diabetes ([place])   2. Foot deformity, acquired, left   3. Cutaneous abscess of left foot     Plan: Discussed with the patient she has 2 options we could either proceed with a tibial calcaneal fusion to realign her foot to provide a plantigrade foot however with patient's current fluid collection in the calcaneocuboid region as well as history of MRSA of the great toe patient is at increased risk of recurrent infection.  Discussed that her best option to have 1 surgery and proceed with activities of daily living without further restrictions would be to proceed with a transtibial amputation.  We discussed the pros and cons of tibial calcaneal fusion versus a transtibial amputation.  Patient states she would like to proceed with a transtibial amputation.  Discussed that she may need inpatient versus outpatient rehabilitation.  Patient states she understands wished to proceed with surgery on Friday.  Follow-Up Instructions: Return in about 2 weeks (around 05/26/2019).   Ortho Exam  Patient is alert, oriented, no adenopathy, well-dressed, normal affect, normal  respiratory effort. Examination patient has a fixed cavovarus deformity of the left foot secondary to over pulling of the anterior tibial tendon.  The hindfoot is in fixed varus.  Patient is ambulating on the calcaneocuboid joint.  She does have a good dorsalis pedis pulse.  There is no evidence of cellulitis of the great toe.  Patient has an ulcer over the base of the cuboid.  Her surgical incision is well-healed.  Review of the CT scan does not show any definite osteomyelitis but does show fluid collection beneath the calcaneocuboid joint which may be consistent with an abscess.  There is no redness or swelling in the left calf.  The foot has no drainage or cellulitis.  Imaging: No results found. No images are attached to the encounter.  Labs: Lab Results  Component Value Date   HGBA1C 7.9 09/17/2018   HGBA1C 7.0 05/08/2018   HGBA1C 6.9 (H) 03/05/2018   ESRSEDRATE 60 (H) 12/05/2018   ESRSEDRATE 41 (H) 12/04/2018   ESRSEDRATE 30 07/12/2018   CRP 22.0 (H) 12/05/2018   CRP 20 (H) 12/04/2018   CRP 6 07/12/2018   LABURIC 8.2 (H) 11/30/2015   REPTSTATUS 12/12/2018 FINAL 12/06/2018   GRAMSTAIN  12/06/2018    RARE WBC PRESENT, PREDOMINANTLY PMN NO ORGANISMS SEEN    CULT  12/06/2018    RARE METHICILLIN RESISTANT STAPHYLOCOCCUS AUREUS WITH NORMAL SKIN FLORA NO ANAEROBES ISOLATED Performed at  [facility], [place] [address], [place], [place] [address]    LABORGA METHICILLIN RESISTANT STAPHYLOCOCCUS AUREUS 12/06/2018     Lab Results  Component Value Date   ALBUMIN 3.7 12/04/2018   ALBUMIN 4.0 11/27/2018   ALBUMIN 3.8 03/11/2018   PREALBUMIN 21.5 03/12/2018   LABURIC 8.2 (H) 11/30/2015    No results found for: MG Lab Results  Component Value Date   VD25OH 11 (L) 09/20/2016    Lab Results  Component Value Date   PREALBUMIN 21.5 03/12/2018   CBC EXTENDED Latest Ref Rng & Units 12/11/2018 12/11/2018 12/10/2018  WBC 4.0 - 10.5 K/uL 10.6(H) 11.6(H) 11.3(H)  RBC 3.87 - 5.11 MIL/uL  3.73(L) 3.76(L) 3.60(L)  HGB 12.0 - 15.0 g/dL 9.8(L) 9.8(L) 9.5(L)  HCT 36.0 - 46.0 % 30.8(L) 31.3(L) 30.0(L)  PLT 150 - 400 K/uL 503(H) 482(H) 480(H)  NEUTROABS 1.7 - 7.7 K/uL - - -  LYMPHSABS 0.7 - 4.0 K/uL - - -     Body mass index is 37.3 kg/m.  Orders:  No orders of the defined types were placed in this encounter.  No orders of the defined types were placed in this encounter.    Procedures: No procedures performed  Clinical Data: No additional findings.  ROS:  All other systems negative, except as noted in the HPI. Review of Systems  Objective: Vital Signs: Ht 5' (1.524 m)   Wt 191 lb (86.6 kg)   BMI 37.30 kg/m   Specialty Comments:  No specialty comments available.  PMFS History: Patient Active Problem List   Diagnosis Date Noted  . Charcot's joint of foot, left   . Post-operative state   . PAD (peripheral artery disease) ([place]) 12/05/2018  . Osteomyelitis of ankle or foot, acute, left ([place]) 12/05/2018  . MRSA bacteremia 12/05/2018  . Sepsis without acute organ dysfunction ([place])   . Charcot's joint of foot due to diabetes ([place]) 09/18/2018  . BMI 36.0-36.9,adult   . Morbid obesity ([place]) 11/19/2017  . B12 deficiency 11/01/2017  . Diabetic foot ulcer ([place]) 12/14/2016  . Diabetic polyneuropathy associated with type 2 diabetes mellitus ([place]) 12/13/2015  . Lumbar stenosis with neurogenic claudication 09/02/2015  . Chronic constipation 07/13/2015  . OSA (obstructive sleep apnea) 06/25/2015  . 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/23/2015  . Hypertriglyceridemia 04/23/2015  . Asthma, mild intermittent 02/01/2015  . Type 2 diabetes mellitus with diabetic nephropathy ([place]) 12/31/2014  . Gastroesophageal reflux disease with esophagitis 12/31/2014  . Dysphagia 12/31/2014  . Bilateral carpal tunnel syndrome 12/01/2014  . Diverticulosis of colon (without mention of hemorrhage) 11/30/2012  . Essential hypertension, benign 11/28/2012   Past Medical  History:  Diagnosis Date  . Anemia   . Arthritis   . Asthma   . Carpal tunnel syndrome on right   . Colon polyps    adenomatous  . Diabetes mellitus without complication ([place])   . Diabetic infection of left foot ([place]) 12/2018  . Diverticulosis of colon   . Esophagitis   . GERD (gastroesophageal reflux disease)   . Hemorrhoid    internal  . Hyperlipemia   . Hypertension   . Myalgia due to statin 11/19/2017  . Neuropathy   . Pneumonia   . PONV (postoperative nausea and vomiting)   . Skin ulcer of right ankle, limited to breakdown of skin ([place]) 12/31/2016  . Sleep apnea    has had in the past lost 50 pounds and do longer uses cpap  . Statin intolerance 04/23/2015  . Stroke-like episode  2009   TIA  . 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 ([place]) 07/13/2015  . Uncontrolled type 2 diabetes mellitus with hyperglycemia ([place]) 11/01/2017    Family History  Problem Relation Age of Onset  . Lung cancer Father   . Hypertension Father   . Arthritis Father   . Other Mother        hardening of the arteries/renal cell carcenoma  . Hypertension Mother   . Stroke Mother   . Kidney cancer Mother   . Arthritis Brother   . Rheum arthritis Maternal Uncle   . Bladder Cancer Neg Hx     Past Surgical History:  Procedure Laterality Date  . ABDOMINAL HYSTERECTOMY    . AMPUTATION Left 12/06/2018   Procedure: PARTIAL AMPUTATION LEFT FOOT;  Surgeon: Edrick Kins, DPM;  Location: [facility];  Service: Podiatry;  Laterality: Left;  . BACK SURGERY  09/02/2015  . BONE BIOPSY Left 12/06/2018   Procedure: Bone Biopsy;  Surgeon: Edrick Kins, DPM;  Location: [facility];  Service: Podiatry;  Laterality: Left;  . CERVICAL FUSION    . CESAREAN SECTION  1986  . COLONOSCOPY N/A 11/30/2012   Procedure: COLONOSCOPY;  Surgeon: Irene Shipper, MD;  Location: [facility] ENDOSCOPY;  Service: Endoscopy;  Laterality: N/A;  . COLONOSCOPY WITH PROPOFOL N/A 01/03/2016   Procedure: COLONOSCOPY WITH PROPOFOL;  Surgeon:  Manya Silvas, MD;  Location: [facility] ENDOSCOPY;  Service: Endoscopy;  Laterality: N/A;  . ESOPHAGOGASTRODUODENOSCOPY (EGD) WITH PROPOFOL N/A 01/14/2015   Procedure: ESOPHAGOGASTRODUODENOSCOPY (EGD) WITH PROPOFOL;  Surgeon: Manya Silvas, MD;  Location: [facility] ENDOSCOPY;  Service: Endoscopy;  Laterality: N/A;  . IRRIGATION AND DEBRIDEMENT FOOT Left 03/13/2018   Procedure: IRRIGATION AND DEBRIDEMENT FOOT WITH BONE BIOPSY WITH MISONIX DEBRIDER;  Surgeon: Evelina Bucy, DPM;  Location: [facility];  Service: Podiatry;  Laterality: Left;  . IRRIGATION AND DEBRIDEMENT FOOT Left 12/06/2018   Procedure: Irrigation And Debridement Foot;  Surgeon: Edrick Kins, DPM;  Location: [facility];  Service: Podiatry;  Laterality: Left;  . LUMBAR WOUND DEBRIDEMENT N/A 10/01/2015   Procedure: LUMBAR WOUND DEBRIDEMENT;  Surgeon: Ashok Pall, MD;  Location: [facility]URO [facility]S;  Service: Neurosurgery;  Laterality: N/A;  LUMBAR WOUND DEBRIDEMENT  . SAVORY DILATION N/A 01/14/2015   Procedure: SAVORY DILATION;  Surgeon: Manya Silvas, MD;  Location: [facility] ENDOSCOPY;  Service: Endoscopy;  Laterality: N/A;  . TONSILLECTOMY     Social History   Occupational History  . Occupation: histology Engineer, production: [employer]  Tobacco Use  . Smoking status: Never Smoker  . Smokeless tobacco: Never Used  Substance and Sexual Activity  . Alcohol use: Never    Frequency: Never  . Drug use: No  . Sexual activity: Not Currently

## 2019-05-13 ENCOUNTER — Ambulatory Visit: Payer: Medicare Other | Admitting: Podiatry

## 2019-05-14 ENCOUNTER — Other Ambulatory Visit (HOSPITAL_COMMUNITY)
Admission: RE | Admit: 2019-05-14 | Discharge: 2019-05-14 | Disposition: A | Discharge status: Home / Self Care | Payer: Medicare Other | Source: Ambulatory Visit | Attending: Orthopedic Surgery | Admitting: Orthopedic Surgery

## 2019-05-14 ENCOUNTER — Other Ambulatory Visit: Payer: Self-pay

## 2019-05-14 ENCOUNTER — Telehealth: Payer: Self-pay | Admitting: Orthopedic Surgery

## 2019-05-14 ENCOUNTER — Encounter (HOSPITAL_COMMUNITY): Payer: Self-pay | Admitting: *Deleted

## 2019-05-14 DIAGNOSIS — Z01812 Encounter for preprocedural laboratory examination: Secondary | ICD-10-CM | POA: Insufficient documentation

## 2019-05-14 DIAGNOSIS — Z20828 Contact with and (suspected) exposure to other viral communicable diseases: Secondary | ICD-10-CM | POA: Insufficient documentation

## 2019-05-14 LAB — SARS CORONAVIRUS 2 (TAT 6-24 HRS): SARS Coronavirus 2: NEGATIVE

## 2019-05-14 NOTE — Telephone Encounter (Signed)
Yes  Stop aspirin

## 2019-05-14 NOTE — Progress Notes (Signed)
Patient denies shortness of breath, fever, cough and chest pain.  PCP - Raelyn Ensign, PA Cardiologist - denies Neurology - Dr Jennings Books Endocrinologist - Dr Lucilla Lame  Chest x-ray - 12/04/18 (2 view) EKG - 11/07/18 Stress Test - many yrs ago, unknown location ECHO - 12/06/18 Cardiac Cath - denies  Sleep Study - Yes CPAP - Does not use CPAP, Lost weight - 50 lbs  Fasting Blood Sugar - 110-120s Checks Blood Sugar _1  times a day  . Do not take oral diabetes medicines (Glimepliride, Metformin) the morning of surgery.  . THE NIGHT BEFORE SURGERY, take 10 units of Tresiba Insulin.     . If your blood sugar is less than 70 mg/dL, you will need to treat for low blood sugar: o Treat a low blood sugar (less than 70 mg/dL) with  cup of clear juice (cranberry or apple). Recheck blood sugar in 15 minutes after treatment (to make sure it is greater than 70 mg/dL). If your blood sugar is not greater than 70 mg/dL on recheck, call [phone] for further instructions.  Aspirin Instructions: Follow your surgeon's instructions on when to stop aspirin prior to surgery,  If no instructions were given by your surgeon then you will need to call the office for those instructions.  ERAS: Clears til 4:30 am DOS, no drink.  Anesthesia review: Yes  STOP now taking any Aspirin (unless otherwise instructed by your surgeon), Aleve, Naproxen, Ibuprofen, Motrin, Advil, Goody's, BC's, all herbal medications, fish oil, and all vitamins.   Coronavirus Screening Have you experienced the following symptoms:  Cough yes/no: No Fever (>100.66F)  yes/no: No Runny nose yes/no: No Sore throat yes/no: No Difficulty breathing/shortness of breath  yes/no: No  Have you traveled in the last 14 days and where? yes/no: No  Patient verbalized understanding of instructions that were given via phone.

## 2019-05-14 NOTE — Telephone Encounter (Signed)
Patient called asked if there is any medication she should stop prior to surgery? The number to contact patient is [phone]

## 2019-05-14 NOTE — Telephone Encounter (Signed)
Dr Sharol Given, patient was called and stated someone from the ED wanted to know if she needs to stop her Aspirin 325mg  before her surgery on 05/16/2019. Please advise, thank you.

## 2019-05-15 ENCOUNTER — Telehealth: Payer: Self-pay | Admitting: Podiatry

## 2019-05-15 NOTE — Progress Notes (Signed)
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 Well controlled, last A1c 6.7 on 03/21/19.  Hx of diabetic insensate neuropathy with multiple infections of her left foot s/p foot salvage intervention with fourth and fifth ray amputations and partial cuboid excision. Recent CT scan which showed fluid in the calcaneocuboid region consistent with an abscess. Transtibial amputation recommended.  Will need DOS labs and eval.  EKG 11/2418: Sinus bradycardia. Rate 50. Nonspecific septal t wave changes.  TTE 12/06/18:  1. The left ventricle has hyperdynamic systolic function, with an ejection fraction of >65%. The cavity size was normal. Left ventricular diastolic Doppler parameters are consistent with impaired relaxation. Elevated left ventricular end-diastolic  pressure The E/e' is >15. No evidence of left ventricular regional wall motion abnormalities.  2. The right ventricle has normal systolic function. The cavity was normal. There is no increase in right ventricular wall thickness.  3. The mitral valve is abnormal. Mild thickening of the mitral valve leaflet. There is mild mitral annular calcification present.  4. The tricuspid valve is grossly normal.  5. The aortic valve is abnormal. Mild sclerosis of the aortic valve. No stenosis of the aortic valve.  6. The inferior vena cava was dilated in size with >50% respiratory variability.   Wynonia Musty [facility] Short Stay Center/Anesthesiology Phone [phone] 05/15/2019 1:04 PM

## 2019-05-15 NOTE — Telephone Encounter (Signed)
I called the patient to wish her best of luck and to let me know if she needed anything.

## 2019-05-15 NOTE — Telephone Encounter (Signed)
Pt called to state that she wanted Dr.Wagoner to know that she is having surgery 05/16/2019 at [facility]

## 2019-05-15 NOTE — Anesthesia Preprocedure Evaluation (Addendum)
Anesthesia Evaluation  Patient identified by MRN, date of birth, ID band Patient awake    Reviewed: Allergy & Precautions, H&P , NPO status , Patient's Chart, lab work & pertinent test results, reviewed documented beta blocker date and time   History of Anesthesia Complications (+) PONV and history of anesthetic complications  Airway Mallampati: II  TM Distance: >3 FB Neck ROM: full    Dental no notable dental hx. (+) Missing, Chipped, Poor Dentition,    Pulmonary asthma , sleep apnea ,    Pulmonary exam normal breath sounds clear to auscultation       Cardiovascular Exercise Tolerance: Good hypertension, Pt. on medications + Peripheral Vascular Disease   Rhythm:regular Rate:Normal     Neuro/Psych  Neuromuscular disease CVA negative psych ROS   GI/Hepatic Neg liver ROS, GERD  Medicated,  Endo/Other  negative endocrine ROSdiabetes, Type 2  Renal/GU Renal disease  negative genitourinary   Musculoskeletal  (+) Arthritis , Osteoarthritis,    Abdominal   Peds  Hematology  (+) Blood dyscrasia, anemia ,   Anesthesia Other Findings   Reproductive/Obstetrics negative OB ROS                           Anesthesia Physical Anesthesia Plan  ASA: III  Anesthesia Plan: General   Post-op Pain Management:    Induction: Intravenous  PONV Risk Score and Plan: 3 and Ondansetron and Treatment may vary due to age or medical condition  Airway Management Planned: LMA  Additional Equipment:   Intra-op Plan:   Post-operative Plan:   Informed Consent: I have reviewed the patients History and Physical, chart, labs and discussed the procedure including the risks, benefits and alternatives for the proposed anesthesia with the patient or authorized representative who has indicated his/her understanding and acceptance.     Dental Advisory Given  Plan Discussed with: CRNA and  Anesthesiologist  Anesthesia Plan Comments: (Follows with endocrinology at [facility] for [facility]. Well controlled, last A1c 6.7 on 03/21/19.  Hx of diabetic insensate neuropathy with multiple infections of her left foot s/p foot salvage intervention with fourth and fifth ray amputations and partial cuboid excision. Recent CT scan which showed fluid in the calcaneocuboid region consistent with an abscess. Transtibial amputation recommended.  Will need DOS labs and eval.  EKG 11/2418: Sinus bradycardia. Rate 50. Nonspecific septal t wave changes.  TTE 12/06/18:  1. The left ventricle has hyperdynamic systolic function, with an ejection fraction of >65%. The cavity size was normal. Left ventricular diastolic Doppler parameters are consistent with impaired relaxation. Elevated left ventricular end-diastolic  pressure The E/e' is >15. No evidence of left ventricular regional wall motion abnormalities.  2. The right ventricle has normal systolic function. The cavity was normal. There is no increase in right ventricular wall thickness.  3. The mitral valve is abnormal. Mild thickening of the mitral valve leaflet. There is mild mitral annular calcification present.  4. The tricuspid valve is grossly normal.  5. The aortic valve is abnormal. Mild sclerosis of the aortic valve. No stenosis of the aortic valve.  6. The inferior vena cava was dilated in size with >50% respiratory variability. )       Anesthesia Quick Evaluation

## 2019-05-15 NOTE — Telephone Encounter (Signed)
Patient was called and informed and understood orders

## 2019-05-16 ENCOUNTER — Inpatient Hospital Stay (HOSPITAL_COMMUNITY): Payer: Medicare Other | Admitting: Physician Assistant

## 2019-05-16 ENCOUNTER — Encounter (HOSPITAL_COMMUNITY)
Admission: RE | Disposition: A | Discharge status: Skilled Nursing Facility | Payer: Self-pay | Source: Home / Self Care | Attending: Orthopedic Surgery

## 2019-05-16 ENCOUNTER — Other Ambulatory Visit: Payer: Self-pay

## 2019-05-16 ENCOUNTER — Inpatient Hospital Stay (HOSPITAL_COMMUNITY)
Admission: RE | Admit: 2019-05-16 | Discharge: 2019-05-21 | DRG: 908 | Disposition: A | Discharge status: Skilled Nursing Facility | Payer: Medicare Other | Attending: Orthopedic Surgery | Admitting: Orthopedic Surgery

## 2019-05-16 ENCOUNTER — Encounter (HOSPITAL_COMMUNITY): Payer: Self-pay | Admitting: Orthopedic Surgery

## 2019-05-16 DIAGNOSIS — M9689 Other intraoperative and postprocedural complications and disorders of the musculoskeletal system: Principal | ICD-10-CM | POA: Diagnosis present

## 2019-05-16 DIAGNOSIS — Z823 Family history of stroke: Secondary | ICD-10-CM | POA: Diagnosis not present

## 2019-05-16 DIAGNOSIS — Z8051 Family history of malignant neoplasm of kidney: Secondary | ICD-10-CM | POA: Diagnosis not present

## 2019-05-16 DIAGNOSIS — E11610 Type 2 diabetes mellitus with diabetic neuropathic arthropathy: Secondary | ICD-10-CM | POA: Diagnosis present

## 2019-05-16 DIAGNOSIS — Z8614 Personal history of Methicillin resistant Staphylococcus aureus infection: Secondary | ICD-10-CM | POA: Diagnosis not present

## 2019-05-16 DIAGNOSIS — I10 Essential (primary) hypertension: Secondary | ICD-10-CM | POA: Diagnosis present

## 2019-05-16 DIAGNOSIS — Z8673 Personal history of transient ischemic attack (TIA), and cerebral infarction without residual deficits: Secondary | ICD-10-CM | POA: Diagnosis not present

## 2019-05-16 DIAGNOSIS — Z20828 Contact with and (suspected) exposure to other viral communicable diseases: Secondary | ICD-10-CM | POA: Diagnosis present

## 2019-05-16 DIAGNOSIS — M86272 Subacute osteomyelitis, left ankle and foot: Secondary | ICD-10-CM | POA: Diagnosis present

## 2019-05-16 DIAGNOSIS — E1143 Type 2 diabetes mellitus with diabetic autonomic (poly)neuropathy: Secondary | ICD-10-CM | POA: Diagnosis present

## 2019-05-16 DIAGNOSIS — L97509 Non-pressure chronic ulcer of other part of unspecified foot with unspecified severity: Secondary | ICD-10-CM | POA: Diagnosis present

## 2019-05-16 DIAGNOSIS — E785 Hyperlipidemia, unspecified: Secondary | ICD-10-CM | POA: Diagnosis present

## 2019-05-16 DIAGNOSIS — Z981 Arthrodesis status: Secondary | ICD-10-CM

## 2019-05-16 DIAGNOSIS — E1152 Type 2 diabetes mellitus with diabetic peripheral angiopathy with gangrene: Secondary | ICD-10-CM | POA: Diagnosis present

## 2019-05-16 DIAGNOSIS — Z8719 Personal history of other diseases of the digestive system: Secondary | ICD-10-CM

## 2019-05-16 DIAGNOSIS — Z8261 Family history of arthritis: Secondary | ICD-10-CM | POA: Diagnosis not present

## 2019-05-16 DIAGNOSIS — Z90710 Acquired absence of both cervix and uterus: Secondary | ICD-10-CM

## 2019-05-16 DIAGNOSIS — K3184 Gastroparesis: Secondary | ICD-10-CM | POA: Diagnosis present

## 2019-05-16 DIAGNOSIS — E1169 Type 2 diabetes mellitus with other specified complication: Secondary | ICD-10-CM | POA: Diagnosis present

## 2019-05-16 DIAGNOSIS — Z801 Family history of malignant neoplasm of trachea, bronchus and lung: Secondary | ICD-10-CM

## 2019-05-16 DIAGNOSIS — Z8249 Family history of ischemic heart disease and other diseases of the circulatory system: Secondary | ICD-10-CM | POA: Diagnosis not present

## 2019-05-16 DIAGNOSIS — I96 Gangrene, not elsewhere classified: Secondary | ICD-10-CM | POA: Diagnosis present

## 2019-05-16 DIAGNOSIS — M21172 Varus deformity, not elsewhere classified, left ankle: Secondary | ICD-10-CM | POA: Diagnosis present

## 2019-05-16 DIAGNOSIS — R001 Bradycardia, unspecified: Secondary | ICD-10-CM | POA: Diagnosis present

## 2019-05-16 DIAGNOSIS — Z79899 Other long term (current) drug therapy: Secondary | ICD-10-CM

## 2019-05-16 DIAGNOSIS — L02612 Cutaneous abscess of left foot: Secondary | ICD-10-CM | POA: Diagnosis present

## 2019-05-16 DIAGNOSIS — K219 Gastro-esophageal reflux disease without esophagitis: Secondary | ICD-10-CM | POA: Diagnosis present

## 2019-05-16 DIAGNOSIS — E11621 Type 2 diabetes mellitus with foot ulcer: Secondary | ICD-10-CM | POA: Diagnosis present

## 2019-05-16 DIAGNOSIS — K5730 Diverticulosis of large intestine without perforation or abscess without bleeding: Secondary | ICD-10-CM | POA: Diagnosis present

## 2019-05-16 HISTORY — DX: Unspecified mononeuropathy of unspecified upper limb: G56.90

## 2019-05-16 HISTORY — DX: Irritable bowel syndrome, unspecified: K58.9

## 2019-05-16 HISTORY — DX: Unspecified mononeuropathy of bilateral lower limbs: G57.93

## 2019-05-16 HISTORY — PX: AMPUTATION: SHX166

## 2019-05-16 LAB — BASIC METABOLIC PANEL
Anion gap: 12 (ref 5–15)
BUN: 17 mg/dL (ref 8–23)
CO2: 22 mmol/L (ref 22–32)
Calcium: 9.5 mg/dL (ref 8.9–10.3)
Chloride: 104 mmol/L (ref 98–111)
Creatinine, Ser: 0.84 mg/dL (ref 0.44–1.00)
GFR calc Af Amer: >60 mL/min (ref >60)
GFR calc non Af Amer: >60 mL/min (ref >60)
Glucose, Bld: 153 mg/dL — ABNORMAL HIGH (ref 70–99)
Potassium: 4.7 mmol/L (ref 3.5–5.1)
Sodium: 138 mmol/L (ref 135–145)

## 2019-05-16 LAB — CBC
HCT: 36.1 % (ref 36.0–46.0)
Hemoglobin: 11.2 g/dL — ABNORMAL LOW (ref 12.0–15.0)
MCH: 25.7 pg — ABNORMAL LOW (ref 26.0–34.0)
MCHC: 31 g/dL (ref 30.0–36.0)
MCV: 83 fL (ref 80.0–100.0)
Platelets: 310 10*3/uL (ref 150–400)
RBC: 4.35 MIL/uL (ref 3.87–5.11)
RDW: 15.3 % (ref 11.5–15.5)
WBC: 8.4 10*3/uL (ref 4.0–10.5)
nRBC: 0 % (ref 0.0–0.2)

## 2019-05-16 LAB — SURGICAL PCR SCREEN
MRSA, PCR: NEGATIVE
Staphylococcus aureus: NEGATIVE

## 2019-05-16 LAB — HEMOGLOBIN A1C
Hgb A1c MFr Bld: 7.3 % — ABNORMAL HIGH (ref 4.8–5.6)
Mean Plasma Glucose: 162.81 mg/dL

## 2019-05-16 LAB — GLUCOSE, CAPILLARY
Glucose-Capillary: 148 mg/dL — ABNORMAL HIGH (ref 70–99)
Glucose-Capillary: 162 mg/dL — ABNORMAL HIGH (ref 70–99)
Glucose-Capillary: 200 mg/dL — ABNORMAL HIGH (ref 70–99)
Glucose-Capillary: 247 mg/dL — ABNORMAL HIGH (ref 70–99)

## 2019-05-16 SURGERY — AMPUTATION BELOW KNEE
Anesthesia: General | Site: Knee | Laterality: Left

## 2019-05-16 MED ORDER — HYDROMORPHONE HCL 1 MG/ML IJ SOLN
0.5000 mg | INTRAMUSCULAR | Status: DC | PRN
  Administered 2019-05-16 – 2019-05-19 (×4): 0.5 mg via INTRAVENOUS
  Filled 2019-05-16 (×3): qty 1

## 2019-05-16 MED ORDER — MIDAZOLAM HCL 5 MG/5ML IJ SOLN
INTRAMUSCULAR | Status: DC | PRN
  Administered 2019-05-16: 2 mg via INTRAVENOUS

## 2019-05-16 MED ORDER — OXYCODONE HCL 5 MG PO TABS
5.0000 mg | ORAL_TABLET | ORAL | Status: DC | PRN
  Administered 2019-05-16: 5 mg via ORAL
  Administered 2019-05-16: 10 mg via ORAL
  Administered 2019-05-16: 5 mg via ORAL
  Administered 2019-05-17 – 2019-05-21 (×17): 10 mg via ORAL
  Filled 2019-05-16 (×19): qty 2
  Filled 2019-05-16: qty 1

## 2019-05-16 MED ORDER — CEFAZOLIN SODIUM-DEXTROSE 2-4 GM/100ML-% IV SOLN
2.0000 g | INTRAVENOUS | Status: AC
  Administered 2019-05-16: 2 g via INTRAVENOUS
  Filled 2019-05-16: qty 100

## 2019-05-16 MED ORDER — FENOFIBRATE 54 MG PO TABS
54.0000 mg | ORAL_TABLET | Freq: Every day | ORAL | Status: DC
  Administered 2019-05-16 – 2019-05-21 (×6): 54 mg via ORAL
  Filled 2019-05-16 (×6): qty 1

## 2019-05-16 MED ORDER — INSULIN ASPART 100 UNIT/ML ~~LOC~~ SOLN
4.0000 [IU] | Freq: Three times a day (TID) | SUBCUTANEOUS | Status: DC
  Administered 2019-05-16 – 2019-05-21 (×7): 4 [IU] via SUBCUTANEOUS

## 2019-05-16 MED ORDER — GABAPENTIN 400 MG PO CAPS
800.0000 mg | ORAL_CAPSULE | Freq: Three times a day (TID) | ORAL | Status: DC
  Administered 2019-05-16 – 2019-05-21 (×15): 800 mg via ORAL
  Filled 2019-05-16 (×10): qty 2
  Filled 2019-05-16: qty 8
  Filled 2019-05-16 (×5): qty 2

## 2019-05-16 MED ORDER — SUCCINYLCHOLINE CHLORIDE 200 MG/10ML IV SOSY
PREFILLED_SYRINGE | INTRAVENOUS | Status: DC | PRN
  Administered 2019-05-16: 120 mg via INTRAVENOUS

## 2019-05-16 MED ORDER — HYDROMORPHONE HCL 1 MG/ML IJ SOLN
INTRAMUSCULAR | Status: AC
  Filled 2019-05-16: qty 1

## 2019-05-16 MED ORDER — PROPOFOL 10 MG/ML IV BOLUS
INTRAVENOUS | Status: AC
  Filled 2019-05-16: qty 20

## 2019-05-16 MED ORDER — ACETAMINOPHEN 325 MG PO TABS: 325.0000 mg | ORAL_TABLET | ORAL | Status: DC | PRN

## 2019-05-16 MED ORDER — GLIMEPIRIDE 2 MG PO TABS
2.0000 mg | ORAL_TABLET | Freq: Two times a day (BID) | ORAL | Status: DC
  Administered 2019-05-16 – 2019-05-21 (×10): 2 mg via ORAL
  Filled 2019-05-16 (×10): qty 1

## 2019-05-16 MED ORDER — ONDANSETRON HCL 4 MG PO TABS: 4.0000 mg | ORAL_TABLET | Freq: Four times a day (QID) | ORAL | Status: DC | PRN

## 2019-05-16 MED ORDER — METOCLOPRAMIDE HCL 5 MG/ML IJ SOLN: 5.0000 mg | Freq: Three times a day (TID) | INTRAMUSCULAR | Status: DC | PRN

## 2019-05-16 MED ORDER — MIDAZOLAM HCL 2 MG/2ML IJ SOLN
INTRAMUSCULAR | Status: AC
  Filled 2019-05-16: qty 2

## 2019-05-16 MED ORDER — OXYCODONE HCL 5 MG PO TABS
5.0000 mg | ORAL_TABLET | Freq: Once | ORAL | Status: AC | PRN
  Administered 2019-05-16: 5 mg via ORAL

## 2019-05-16 MED ORDER — FENTANYL CITRATE (PF) 100 MCG/2ML IJ SOLN
INTRAMUSCULAR | Status: DC | PRN
  Administered 2019-05-16: 150 ug via INTRAVENOUS

## 2019-05-16 MED ORDER — METFORMIN HCL ER 500 MG PO TB24
500.0000 mg | ORAL_TABLET | Freq: Three times a day (TID) | ORAL | Status: DC
  Administered 2019-05-16 – 2019-05-21 (×12): 500 mg via ORAL
  Filled 2019-05-16 (×15): qty 1

## 2019-05-16 MED ORDER — FENTANYL CITRATE (PF) 100 MCG/2ML IJ SOLN
INTRAMUSCULAR | Status: AC
  Filled 2019-05-16: qty 2

## 2019-05-16 MED ORDER — DEXAMETHASONE SODIUM PHOSPHATE 10 MG/ML IJ SOLN
INTRAMUSCULAR | Status: DC | PRN
  Administered 2019-05-16: 5 mg via INTRAVENOUS

## 2019-05-16 MED ORDER — ONDANSETRON HCL 4 MG/2ML IJ SOLN: 4.0000 mg | Freq: Four times a day (QID) | INTRAMUSCULAR | Status: DC | PRN

## 2019-05-16 MED ORDER — DOCUSATE SODIUM 100 MG PO CAPS
100.0000 mg | ORAL_CAPSULE | Freq: Two times a day (BID) | ORAL | Status: DC
  Administered 2019-05-16 – 2019-05-21 (×10): 100 mg via ORAL
  Filled 2019-05-16 (×10): qty 1

## 2019-05-16 MED ORDER — ALBUTEROL SULFATE (2.5 MG/3ML) 0.083% IN NEBU: 2.5000 mg | INHALATION_SOLUTION | Freq: Four times a day (QID) | RESPIRATORY_TRACT | Status: DC | PRN

## 2019-05-16 MED ORDER — FENTANYL CITRATE (PF) 100 MCG/2ML IJ SOLN
25.0000 ug | INTRAMUSCULAR | Status: AC | PRN
  Administered 2019-05-16 (×6): 25 ug via INTRAVENOUS

## 2019-05-16 MED ORDER — OXYCODONE HCL 5 MG PO TABS
ORAL_TABLET | ORAL | Status: AC
  Filled 2019-05-16: qty 1

## 2019-05-16 MED ORDER — LACTATED RINGERS IV SOLN
INTRAVENOUS | Status: DC | PRN
  Administered 2019-05-16: 07:00:00 via INTRAVENOUS

## 2019-05-16 MED ORDER — PROPOFOL 10 MG/ML IV BOLUS
INTRAVENOUS | Status: DC | PRN
  Administered 2019-05-16: 150 mg via INTRAVENOUS

## 2019-05-16 MED ORDER — ACETAMINOPHEN 325 MG PO TABS
325.0000 mg | ORAL_TABLET | Freq: Four times a day (QID) | ORAL | Status: DC | PRN
  Administered 2019-05-17 – 2019-05-18 (×6): 650 mg via ORAL
  Filled 2019-05-16 (×6): qty 2

## 2019-05-16 MED ORDER — INSULIN GLARGINE 100 UNIT/ML ~~LOC~~ SOLN
16.0000 [IU] | Freq: Every day | SUBCUTANEOUS | Status: DC
  Administered 2019-05-16 – 2019-05-20 (×4): 16 [IU] via SUBCUTANEOUS
  Filled 2019-05-16 (×6): qty 0.16

## 2019-05-16 MED ORDER — LIDOCAINE 2% (20 MG/ML) 5 ML SYRINGE
INTRAMUSCULAR | Status: DC | PRN
  Administered 2019-05-16: 80 mg via INTRAVENOUS

## 2019-05-16 MED ORDER — ONDANSETRON HCL 4 MG/2ML IJ SOLN: 4.0000 mg | Freq: Once | INTRAMUSCULAR | Status: DC | PRN

## 2019-05-16 MED ORDER — MEPERIDINE HCL 25 MG/ML IJ SOLN: 6.2500 mg | INTRAMUSCULAR | Status: DC | PRN

## 2019-05-16 MED ORDER — HYDROCHLOROTHIAZIDE 25 MG PO TABS
12.5000 mg | ORAL_TABLET | Freq: Every day | ORAL | Status: DC
  Administered 2019-05-17 – 2019-05-21 (×5): 12.5 mg via ORAL
  Filled 2019-05-16 (×5): qty 1

## 2019-05-16 MED ORDER — FENTANYL CITRATE (PF) 250 MCG/5ML IJ SOLN
INTRAMUSCULAR | Status: AC
  Filled 2019-05-16: qty 5

## 2019-05-16 MED ORDER — METOCLOPRAMIDE HCL 5 MG PO TABS: 5.0000 mg | ORAL_TABLET | Freq: Three times a day (TID) | ORAL | Status: DC | PRN

## 2019-05-16 MED ORDER — DULOXETINE HCL 60 MG PO CPEP
60.0000 mg | ORAL_CAPSULE | Freq: Every day | ORAL | Status: DC
  Administered 2019-05-16 – 2019-05-20 (×5): 60 mg via ORAL
  Filled 2019-05-16 (×5): qty 1

## 2019-05-16 MED ORDER — CEFAZOLIN SODIUM-DEXTROSE 2-4 GM/100ML-% IV SOLN
2.0000 g | Freq: Four times a day (QID) | INTRAVENOUS | Status: AC
  Administered 2019-05-16 – 2019-05-17 (×3): 2 g via INTRAVENOUS
  Filled 2019-05-16 (×2): qty 100

## 2019-05-16 MED ORDER — SODIUM CHLORIDE 0.9 % IV SOLN
INTRAVENOUS | Status: DC
  Administered 2019-05-16: 17:00:00 via INTRAVENOUS

## 2019-05-16 MED ORDER — INSULIN DEGLUDEC 200 UNIT/ML ~~LOC~~ SOPN: 15.0000 [IU] | PEN_INJECTOR | Freq: Every day | SUBCUTANEOUS | Status: DC

## 2019-05-16 MED ORDER — ALBUTEROL SULFATE 108 (90 BASE) MCG/ACT IN AEPB: 1.0000 | INHALATION_SPRAY | Freq: Four times a day (QID) | RESPIRATORY_TRACT | Status: DC | PRN

## 2019-05-16 MED ORDER — SODIUM CHLORIDE 0.9 % IR SOLN
Status: DC | PRN
  Administered 2019-05-16: 1000 mL

## 2019-05-16 MED ORDER — SENNOSIDES-DOCUSATE SODIUM 8.6-50 MG PO TABS: 1.0000 | ORAL_TABLET | Freq: Every evening | ORAL | Status: DC | PRN

## 2019-05-16 MED ORDER — CHLORHEXIDINE GLUCONATE 4 % EX LIQD: 60.0000 mL | Freq: Once | CUTANEOUS | Status: DC

## 2019-05-16 MED ORDER — INSULIN ASPART 100 UNIT/ML ~~LOC~~ SOLN
0.0000 [IU] | Freq: Three times a day (TID) | SUBCUTANEOUS | Status: DC
  Administered 2019-05-16 – 2019-05-19 (×2): 3 [IU] via SUBCUTANEOUS
  Administered 2019-05-20: 2 [IU] via SUBCUTANEOUS

## 2019-05-16 MED ORDER — ASPIRIN EC 325 MG PO TBEC
325.0000 mg | DELAYED_RELEASE_TABLET | Freq: Every day | ORAL | Status: DC
  Administered 2019-05-16 – 2019-05-21 (×6): 325 mg via ORAL
  Filled 2019-05-16 (×6): qty 1

## 2019-05-16 MED ORDER — BISACODYL 10 MG RE SUPP: 10.0000 mg | Freq: Every day | RECTAL | Status: DC | PRN

## 2019-05-16 MED ORDER — ACETAMINOPHEN 160 MG/5ML PO SOLN: 325.0000 mg | ORAL | Status: DC | PRN

## 2019-05-16 MED ORDER — ONDANSETRON HCL 4 MG/2ML IJ SOLN
INTRAMUSCULAR | Status: DC | PRN
  Administered 2019-05-16: 4 mg via INTRAVENOUS

## 2019-05-16 MED ORDER — OXYCODONE HCL 5 MG/5ML PO SOLN: 5.0000 mg | Freq: Once | ORAL | Status: AC | PRN

## 2019-05-16 SURGICAL SUPPLY — 37 items
BLADE SAW RECIP 87.9 MT (BLADE) ×2 IMPLANT
BLADE SURG 21 STRL SS (BLADE) ×2 IMPLANT
BNDG COHESIVE 6X5 TAN STRL LF (GAUZE/BANDAGES/DRESSINGS) ×2 IMPLANT
CANISTER WOUND CARE 500ML ATS (WOUND CARE) ×2 IMPLANT
COVER SURGICAL LIGHT HANDLE (MISCELLANEOUS) ×2 IMPLANT
COVER WAND RF STERILE (DRAPES) IMPLANT
CUFF TOURN SGL QUICK 34 (TOURNIQUET CUFF) ×1
CUFF TRNQT CYL 34X4.125X (TOURNIQUET CUFF) ×1 IMPLANT
DRAPE INCISE IOBAN 66X45 STRL (DRAPES) ×2 IMPLANT
DRAPE U-SHAPE 47X51 STRL (DRAPES) ×2 IMPLANT
DRESSING PREVENA PLUS CUSTOM (GAUZE/BANDAGES/DRESSINGS) ×1 IMPLANT
DRSG PREVENA PLUS CUSTOM (GAUZE/BANDAGES/DRESSINGS) ×2
DURAPREP 26ML APPLICATOR (WOUND CARE) ×2 IMPLANT
ELECT REM PT RETURN 9FT ADLT (ELECTROSURGICAL) ×2
ELECTRODE REM PT RTRN 9FT ADLT (ELECTROSURGICAL) ×1 IMPLANT
GLOVE BIOGEL PI IND STRL 9 (GLOVE) ×1 IMPLANT
GLOVE BIOGEL PI INDICATOR 9 (GLOVE) ×1
GLOVE SURG ORTHO 9.0 STRL STRW (GLOVE) ×2 IMPLANT
GOWN STRL REUS W/ TWL XL LVL3 (GOWN DISPOSABLE) ×2 IMPLANT
GOWN STRL REUS W/TWL XL LVL3 (GOWN DISPOSABLE) ×2
KIT BASIN OR (CUSTOM PROCEDURE TRAY) ×2 IMPLANT
KIT TURNOVER KIT B (KITS) ×2 IMPLANT
MANIFOLD NEPTUNE II (INSTRUMENTS) ×2 IMPLANT
NS IRRIG 1000ML POUR BTL (IV SOLUTION) ×2 IMPLANT
PACK ORTHO EXTREMITY (CUSTOM PROCEDURE TRAY) ×2 IMPLANT
PAD ARMBOARD 7.5X6 YLW CONV (MISCELLANEOUS) ×2 IMPLANT
PREVENA RESTOR ARTHOFORM 46X30 (CANNISTER) ×2 IMPLANT
SPONGE LAP 18X18 RF (DISPOSABLE) IMPLANT
STAPLER VISISTAT 35W (STAPLE) IMPLANT
STOCKINETTE IMPERVIOUS LG (DRAPES) ×2 IMPLANT
SUT ETHILON 2 0 PSLX (SUTURE) IMPLANT
SUT SILK 2 0 (SUTURE) ×1
SUT SILK 2-0 18XBRD TIE 12 (SUTURE) ×1 IMPLANT
SUT VIC AB 1 CTX 27 (SUTURE) ×4 IMPLANT
TOWEL GREEN STERILE (TOWEL DISPOSABLE) ×2 IMPLANT
TUBE CONNECTING 12X1/4 (SUCTIONS) ×2 IMPLANT
YANKAUER SUCT BULB TIP NO VENT (SUCTIONS) ×2 IMPLANT

## 2019-05-16 NOTE — Anesthesia Procedure Notes (Signed)
Procedure Name: Intubation Date/Time: 05/16/2019 7:40 AM Performed by: Imagene Riches, CRNA Pre-anesthesia Checklist: Patient identified, Emergency Drugs available, Suction available and Patient being monitored Patient Re-evaluated:Patient Re-evaluated prior to induction Oxygen Delivery Method: Circle System Utilized Preoxygenation: Pre-oxygenation with 100% oxygen Induction Type: IV induction Ventilation: Mask ventilation without difficulty Laryngoscope Size: Miller and 2 Grade View: Grade I Tube type: Oral Tube size: 7.0 mm Number of attempts: 1 Airway Equipment and Method: Stylet and Oral airway Placement Confirmation: ETT inserted through vocal cords under direct vision,  positive ETCO2 and breath sounds checked- equal and bilateral Secured at: 23 cm Tube secured with: Tape Dental Injury: Teeth and Oropharynx as per pre-operative assessment

## 2019-05-16 NOTE — Transfer of Care (Signed)
Immediate Anesthesia Transfer of Care Note  Patient: Olivia Romero  Procedure(s) Performed: LEFT BELOW KNEE AMPUTATION (Left Knee)  Patient Location: PACU  Anesthesia Type:General  Level of Consciousness: awake, alert  and oriented  Airway & Oxygen Therapy: Patient Spontanous Breathing and Patient connected to nasal cannula oxygen  Post-op Assessment: Report given to RN and Post -op Vital signs reviewed and stable  Post vital signs: Reviewed and stable  Last Vitals:  Vitals Value Taken Time  BP 154/70 05/16/19 0811  Temp    Pulse 108 05/16/19 0813  Resp 18 05/16/19 0813  SpO2 98 % 05/16/19 0813  Vitals shown include unvalidated device data.  Last Pain:  Vitals:   05/16/19 0637  PainSc: 0-No pain      Patients Stated Pain Goal: 5 ([id number])  Complications: No apparent anesthesia complications

## 2019-05-16 NOTE — H&P (Signed)
Olivia Romero is an 63 y.o. female.   Chief Complaint:  Left Foot Gangrene HPI: Patient is a 64 year old woman with diabetic insensate neuropathy she has had multiple infections of her left foot and has undergone excellent foot salvage intervention she has had fourth and fifth ray amputations left foot with partial cuboid excision.  Patient has had a MRSA infection of the great toe which is also been debrided.  Patient is status post a CT scan which shows fluid in the calcaneocuboid region consistent with an abscess.  Past Medical History:  Diagnosis Date  . Anemia   . Arthritis   . Asthma   . Colon polyps    adenomatous  . Diabetes mellitus without complication ([place])   . Diabetic infection of left foot ([place]) 12/2018  . Diverticulosis of colon   . Esophagitis   . GERD (gastroesophageal reflux disease)   . Hemorrhoid    internal  . Hyperlipemia   . Hypertension   . IBS (irritable bowel syndrome)    no current prob - diet controlled  . Myalgia due to statin 11/19/2017  . Neuropathy   . Neuropathy of both feet   . Neuropathy of hand   . Pneumonia    x 4  . PONV (postoperative nausea and vomiting)    on some surgeries but not all procedures  . Skin ulcer of right ankle, limited to breakdown of skin ([place]) 12/31/2016   resolved per patient 05/14/19  . Sleep apnea    has had in the past lost 50 pounds and do longer uses cpap  . Statin intolerance 04/23/2015  . Stroke [facility]) 2009   mini stroke per patient  . Stroke-like episode 2009   TIA - mini stroke per patient  . 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 ([place]) 07/13/2015  . Uncontrolled type 2 diabetes mellitus with hyperglycemia ([place]) 11/01/2017    Past Surgical History:  Procedure Laterality Date  . ABDOMINAL HYSTERECTOMY    . AMPUTATION Left 12/06/2018   Procedure: PARTIAL AMPUTATION LEFT FOOT;  Surgeon: Edrick Kins, DPM;  Location: [place];  Service: Podiatry;  Laterality: Left;  . BACK SURGERY  09/02/2015   . BONE BIOPSY Left 12/06/2018   Procedure: Bone Biopsy;  Surgeon: Edrick Kins, DPM;  Location: [place];  Service: Podiatry;  Laterality: Left;  . CERVICAL FUSION    . CESAREAN SECTION  1986  . COLONOSCOPY N/A 11/30/2012   Procedure: COLONOSCOPY;  Surgeon: Irene Shipper, MD;  Location: [facility] ENDOSCOPY;  Service: Endoscopy;  Laterality: N/A;  . COLONOSCOPY WITH PROPOFOL N/A 01/03/2016   Procedure: COLONOSCOPY WITH PROPOFOL;  Surgeon: Manya Silvas, MD;  Location: [facility] ENDOSCOPY;  Service: Endoscopy;  Laterality: N/A;  . ESOPHAGOGASTRODUODENOSCOPY (EGD) WITH PROPOFOL N/A 01/14/2015   Procedure: ESOPHAGOGASTRODUODENOSCOPY (EGD) WITH PROPOFOL;  Surgeon: Manya Silvas, MD;  Location: [facility] ENDOSCOPY;  Service: Endoscopy;  Laterality: N/A;  . IRRIGATION AND DEBRIDEMENT FOOT Left 03/13/2018   Procedure: IRRIGATION AND DEBRIDEMENT FOOT WITH BONE BIOPSY WITH MISONIX DEBRIDER;  Surgeon: Evelina Bucy, DPM;  Location: [place];  Service: Podiatry;  Laterality: Left;  . IRRIGATION AND DEBRIDEMENT FOOT Left 12/06/2018   Procedure: Irrigation And Debridement Foot;  Surgeon: Edrick Kins, DPM;  Location: [place];  Service: Podiatry;  Laterality: Left;  . LUMBAR WOUND DEBRIDEMENT N/A 10/01/2015   Procedure: LUMBAR WOUND DEBRIDEMENT;  Surgeon: Ashok Pall, MD;  Location: [place] NEURO ORS;  Service: Neurosurgery;  Laterality: N/A;  LUMBAR WOUND DEBRIDEMENT  .  NASAL SINUS SURGERY    . SAVORY DILATION N/A 01/14/2015   Procedure: SAVORY DILATION;  Surgeon: Manya Silvas, MD;  Location: [facility] ENDOSCOPY;  Service: Endoscopy;  Laterality: N/A;  . TONSILLECTOMY    . WISDOM TOOTH EXTRACTION      Family History  Problem Relation Age of Onset  . Lung cancer Father   . Hypertension Father   . Arthritis Father   . Other Mother        hardening of the arteries/renal cell carcenoma  . Hypertension Mother   . Stroke Mother   . Kidney cancer Mother   . Arthritis Brother   . Rheum arthritis Maternal Uncle   . Bladder Cancer  Neg Hx    Social History:  reports that she has never smoked. She has never used smokeless tobacco. She reports that she does not drink alcohol or use drugs.  Allergies:  Allergies  Allergen Reactions  . Eggs Or Egg-Derived Products Swelling and Other (See Comments)    Angioedema  . Atorvastatin Other (See Comments)    Liver toxicity  . Flu Virus Vaccine Swelling    Arm swelled (site of injection)  . Latex Itching  . Pravastatin Itching and Rash  . Tape Rash and Other (See Comments)    Adhesive on foot pull off skin.  Ok to use paper tape.    Medications Prior to Admission  Medication Sig Dispense Refill  . acetaminophen (TYLENOL) 500 MG tablet Take 1,000 mg by mouth every 6 (six) hours as needed for mild pain. Maximum of 3,000 mg per day    . Albuterol Sulfate (PROAIR RESPICLICK) 518 (90 Base) MCG/ACT AEPB Inhale 1-2 puffs into the lungs every 6 (six) hours as needed (Shortness of breath or wheezing). 1 each 2  . aspirin EC 325 MG tablet Take 1 tablet (325 mg total) by mouth daily. 30 tablet 0  . Chlorphen-Phenyleph-ASA (ALKA-SELTZER PLUS COLD PO) Take 2 tablets by mouth as needed (allergies). Day and night time    . DULoxetine (CYMBALTA) 60 MG capsule Take 60 mg by mouth at bedtime.    . fenofibrate (TRICOR) 145 MG tablet Take 145 mg by mouth daily.  10  . gabapentin (NEURONTIN) 400 MG capsule Take 800 mg by mouth 3 (three) times daily.     [name] glimepiride (AMARYL) 2 MG tablet Take 2 mg by mouth 2 (two) times daily.     . hydrochlorothiazide (HYDRODIURIL) 12.5 MG tablet Take 12.5 mg by mouth daily.    . Insulin Degludec (TRESIBA FLEXTOUCH) 200 UNIT/ML SOPN Inject 20 Units into the skin at bedtime.     . metFORMIN (GLUCOPHAGE-XR) 500 MG 24 hr tablet Take 500 mg by mouth 3 (three) times daily.     . pantoprazole (PROTONIX) 40 MG tablet Take 1 tablet (40 mg total) by mouth daily. (Patient taking differently: Take 40 mg by mouth daily as needed (Heartburn). ) 90 tablet 1  .  sulfamethoxazole-trimethoprim (BACTRIM DS) 800-160 MG tablet Take 1 tablet by mouth 2 (two) times daily for 7 days. 14 tablet 0  . Blood Glucose Monitoring Suppl (CONTOUR NEXT MONITOR) w/Device KIT as directed.     . cephALEXin (KEFLEX) 500 MG capsule Take 1 capsule (500 mg total) by mouth 3 (three) times daily. (Patient not taking: Reported on 05/13/2019) 21 capsule 0  . clindamycin (CLEOCIN) 300 MG capsule Take 1 capsule (300 mg total) by mouth 3 (three) times daily. (Patient not taking: Reported on 05/13/2019) 30 capsule 0  . diclofenac  sodium (VOLTAREN) 1 % GEL Apply 2 g topically 4 (four) times daily. Rub into affected area of foot 2 to 4 times daily (Patient taking differently: Apply 2 g topically daily as needed (pain). Rub into affected area of foot 2 to 4 times daily) 100 g 2  . hydrochlorothiazide 10 mg/mL SUSP Take 0.63 mLs (6.25 mg total) by mouth daily. 18.9 mL 0  . mupirocin ointment (BACTROBAN) 2 % Apply 1 application topically 2 (two) times daily. (Patient not taking: Reported on 05/13/2019) 30 g 2  . oxyCODONE (OXY IR/ROXICODONE) 5 MG immediate release tablet oxycodone 5 mg tablet    . ULTICARE SHORT PEN NEEDLES 31G X 8 MM MISC       Results for orders placed or performed during the hospital encounter of 05/14/19 (from the past 48 hour(s))  SARS CORONAVIRUS 2 (TAT 6-24 HRS) Nasopharyngeal Nasopharyngeal Swab     Status: None   Collection Time: 05/14/19  8:42 AM   Specimen: Nasopharyngeal Swab  Result Value Ref Range   SARS Coronavirus 2 NEGATIVE NEGATIVE    Comment: (NOTE) SARS-CoV-2 target nucleic acids are NOT DETECTED. The SARS-CoV-2 RNA is generally detectable in upper and lower respiratory specimens during the acute phase of infection. Negative results do not preclude SARS-CoV-2 infection, do not rule out co-infections with other pathogens, and should not be used as the sole basis for treatment or other patient management decisions. Negative results must be combined with  clinical observations, patient history, and epidemiological information. The expected result is Negative. Fact Sheet for Patients: SugarRoll.be Fact Sheet for Healthcare Providers: https://www.woods-mathews.com/ This test is not yet approved or cleared by the [place] FDA and  has been authorized for detection and/or diagnosis of SARS-CoV-2 by FDA under an Emergency Use Authorization (EUA). This EUA will remain  in effect (meaning this test can be used) for the duration of the COVID-19 declaration under Section 56 4(b)(1) of the Act, 21 U.S.C. section 360bbb-3(b)(1), unless the authorization is terminated or revoked sooner. Performed at [facility], [place] [address] [place], [place] [address]    No results found.  Review of Systems  All other systems reviewed and are negative.   Blood pressure (!) 136/55, pulse 73, temperature 98.9 F (37.2 C), resp. rate 17, height 5' (1.524 m), weight (!) 186 kg, SpO2 100 %. Physical Exam  Patient is alert, oriented, no adenopathy, well-dressed, normal affect, normal respiratory effort. Examination patient has a fixed cavovarus deformity of the left foot secondary to over pulling of the anterior tibial tendon.  The hindfoot is in fixed varus.  Patient is ambulating on the calcaneocuboid joint.  She does have a good dorsalis pedis pulse.  There is no evidence of cellulitis of the great toe.  Patient has an ulcer over the base of the cuboid.  Her surgical incision is well-healed.  Review of the CT scan does not show any definite osteomyelitis but does show fluid collection beneath the calcaneocuboid joint which may be consistent with an abscess.  There is no redness or swelling in the left calf.  The foot has no drainage or cellulitis.  Assessment/Plan 1. Charcot's joint of foot due to diabetes ([place])   2. Foot deformity, acquired, left   3. Cutaneous abscess of left foot     Plan: Discussed  with the patient she has 2 options we could either proceed with a tibial calcaneal fusion to realign her foot to provide a plantigrade foot however with patient's current fluid collection  in the calcaneocuboid region as well as history of MRSA of the great toe patient is at increased risk of recurrent infection.  Discussed that her best option to have 1 surgery and proceed with activities of daily living without further restrictions would be to proceed with a transtibial amputation.  We discussed the pros and cons of tibial calcaneal fusion versus a transtibial amputation.  Patient states she would like to proceed with a transtibial amputation.  Discussed that she may need inpatient versus outpatient rehabilitation.  Patient states she understands wished to proceed with surgery on Friday.   Bevely Palmer Rachard Isidro, PA 05/16/2019, 6:41 AM

## 2019-05-16 NOTE — Progress Notes (Signed)
Orthopedic Tech Progress Note Patient Details:  [name] 05/25/1956 [id number] Called in order to [employer] for a VIVE PROTOCOL BK with education  Patient ID: Olivia Romero, female   DOB: [birth year], 63 y.o.   MRN: [id number]   Janit Pagan 05/16/2019, 3:57 PM

## 2019-05-16 NOTE — Progress Notes (Signed)
1525 Received pt from PACU, A&O x4. Left BKA with wound vac dressing dressing dry and intact. C/o surgical pain, medicated for pain.

## 2019-05-16 NOTE — Op Note (Signed)
   Date of Surgery: 05/16/2019  INDICATIONS: [name] is a 63 y.o.-year-old female who patient is a 63 year old woman status post partial ray amputation with cavovarus collapse of the left foot.  She currently ambulates on the lateral aspect of her foot with chronic ulceration pain and deformity.[name]  PREOPERATIVE DIAGNOSIS: Osteomyelitis with cavovarus collapse left foot  POSTOPERATIVE DIAGNOSIS: Same.  PROCEDURE: Transtibial amputation Application of Prevena wound VAC  SURGEON: Sharol Given, M.D.  ANESTHESIA:  general  IV FLUIDS AND URINE: See anesthesia.  ESTIMATED BLOOD LOSS: Minimal mL.  COMPLICATIONS: None.  DESCRIPTION OF PROCEDURE: The patient was brought to the operating room and underwent a general anesthetic. After adequate levels of anesthesia were obtained patient's lower extremity was prepped using DuraPrep draped into a sterile field. A timeout was called. The foot was draped out of the sterile field with impervious stockinette. A transverse incision was made 11 cm distal to the tibial tubercle. This curved proximally and a large posterior flap was created. The tibia was transected 1 cm proximal to the skin incision. The fibula was transected just proximal to the tibial incision. The tibia was beveled anteriorly. A large posterior flap was created. The sciatic nerve was pulled cut and allowed to retract. The vascular bundles were suture ligated with 2-0 silk. The deep and superficial fascial layers were closed using #1 Vicryl. The skin was closed using staples and 2-0 nylon. The wound was covered with a Prevena wound VAC. There was a good suction fit. A prosthetic shrinker was applied. Patient was extubated taken to the PACU in stable condition.   DISCHARGE PLANNING:  Antibiotic duration: 24 hours  Weightbearing: Nonweightbearing on the left  Pain medication: Opioid pathway  Dressing care/ Wound VAC: Wound VAC for 1 week  Discharge to: Inpatient versus outpatient rehab   Follow-up: In the office 1 week post operative.  Meridee Score, MD [place] 8:13 AM

## 2019-05-17 LAB — GLUCOSE, CAPILLARY
Glucose-Capillary: 95 mg/dL (ref 70–99)
Glucose-Capillary: 59 mg/dL — ABNORMAL LOW (ref 70–99)
Glucose-Capillary: 70 mg/dL (ref 70–99)
Glucose-Capillary: 85 mg/dL (ref 70–99)
Glucose-Capillary: 164 mg/dL — ABNORMAL HIGH (ref 70–99)

## 2019-05-17 NOTE — Progress Notes (Signed)
Hypoglycemic Event  CBG: 59  Treatment: 4oz OJ  Symptoms: None  Follow-up CBG: Time: 1231  CBG Result: 70  Possible Reasons for Event: Meds + insulin  Comments/MD notified: Pt currently eating lunch. 12pm insulin and meds held.    Debbora Dus

## 2019-05-17 NOTE — Evaluation (Signed)
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  PMH includes: uncontrolled DM, CVA, sleep apnea, PNA, neuropathy bil. feet and hand, HTN, s/p cervical fusion, s/p partial amputation Lt foot, s/p back surgery    Clinical Impression  Pt presented supine in bed with HOB elevated, awake and willing to participate in therapy session. Prior to admission, pt reported that she was at a mod I level for mobility. She was able to ambulate short distances with a RW or mobilized in a w/c. Pt lives with her son and daughter-in-law in a single level house with a ramped entrance. She will have family support 24/7 upon d/c. At the time of evaluation, pt performing bed mobility with min guard and transfers with min A x2 with use of RW. Pt very eager and motivated to make progress and return to her independence. She is an excellent candidate for CIR to maximize her independence with functional mobility prior to returning home with family support. Pt would continue to benefit from skilled physical therapy services at this time while admitted and after d/c to address the below listed limitations in order to improve overall safety and independence with functional mobility.     Follow Up Recommendations CIR    Equipment Recommendations  None recommended by PT    Recommendations for Other Services Rehab consult     Precautions / Restrictions Precautions Precautions: Fall Precaution Comments: wound vac Required Braces or Orthoses: Other Brace Other Brace: limb protector Restrictions Weight Bearing Restrictions: Yes LLE Weight Bearing: Non weight bearing      Mobility  Bed Mobility Overal bed mobility: Needs Assistance Bed Mobility: Supine to Sit     Supine to sit: Min guard      General bed mobility comments: use of rail and momentum, no physical assist  Transfers Overall transfer level: Needs assistance Equipment used: Rolling walker (2 wheeled) Transfers: Sit to/from [facility] Sit to Stand: +2 physical assistance;Min assist Stand pivot transfers: +2 physical assistance;Min assist       General transfer comment: assist to rise and steady, hopped on R foot to pivot to chair  Ambulation/Gait             General Gait Details: able to hop on R LE for a few pivotal steps to chair  Stairs            Wheelchair Mobility    Modified Rankin (Stroke Patients Only)       Balance Overall balance assessment: Needs assistance Sitting-balance support: Feet supported;No upper extremity supported Sitting balance-Leahy Scale: Fair     Standing balance support: Bilateral upper extremity supported Standing balance-Leahy Scale: Poor                               Pertinent Vitals/Pain Pain Assessment: 0-10 Pain Score: 6  Pain Location: L LE Pain Descriptors / Indicators: Sore Pain Intervention(s): Monitored during session;Repositioned    Home Living Family/patient expects to be discharged to:: Private residence Living Arrangements: Children(daughter in law has back problems, cannot lift) Available Help at Discharge: Family;Available 24 hours/day Type of Home: House Home Access: Ramped entrance     Home Layout: One level Home Equipment: Walker - 4 wheels;Adaptive equipment;Crutches;Walker - 2 wheels;Wheelchair - [employer]  Prior Function Level of Independence: Independent with assistive device(s)         Comments: walking some, depending on the day with RW, could bathe and dress, used toilet aide, could even do some cleaning from w/c level      Hand Dominance   Dominant Hand: Right    Extremity/Trunk Assessment   Upper Extremity Assessment Upper Extremity Assessment: Overall WFL for  tasks assessed;Defer to OT evaluation    Lower Extremity Assessment Lower Extremity Assessment: LLE deficits/detail LLE Deficits / Details: pt with wound VAC and limb protector in place; very anxious and tearful throughout regarding the loss of her LE (she did not even want to look at her residual limb); therefore did not attempt assessment     Cervical / Trunk Assessment Cervical / Trunk Assessment: Other exceptions Cervical / Trunk Exceptions: obesity  Communication   Communication: No difficulties  Cognition Arousal/Alertness: Awake/alert Behavior During Therapy: WFL for tasks assessed/performed Overall Cognitive Status: Within Functional Limits for tasks assessed                                        General Comments      Exercises     Assessment/Plan    PT Assessment Patient needs continued PT services  PT Problem List Decreased strength;Decreased range of motion;Decreased activity tolerance;Decreased balance;Decreased mobility;Decreased coordination;Decreased safety awareness;Decreased knowledge of use of DME;Decreased knowledge of precautions;Pain       PT Treatment Interventions DME instruction;Gait training;Stair training;Functional mobility training;Therapeutic activities;Therapeutic exercise;Balance training;Neuromuscular re-education;Patient/family education    PT Goals (Current goals can be found in the Care Plan section)  Acute Rehab PT Goals Patient Stated Goal: to be more independent before going home PT Goal Formulation: With patient Time For Goal Achievement: 05/31/19 Potential to Achieve Goals: Good    Frequency Min 5X/week   Barriers to discharge        Co-evaluation PT/OT/SLP Co-Evaluation/Treatment: Yes Reason for Co-Treatment: To address functional/ADL transfers;For patient/therapist safety PT goals addressed during session: Mobility/safety with mobility;Proper use of DME;Balance;Strengthening/ROM OT goals addressed during  session: ADL's and self-care;Proper use of Adaptive equipment and DME       AM-PAC PT "6 Clicks" Mobility  Outcome Measure Help needed turning from your back to your side while in a flat bed without using bedrails?: None Help needed moving from lying on your back to sitting on the side of a flat bed without using bedrails?: None Help needed moving to and from a bed to a chair (including a wheelchair)?: A Lot Help needed standing up from a chair using your arms (e.g., wheelchair or bedside chair)?: A Little Help needed to walk in hospital room?: Total Help needed climbing 3-5 steps with a railing? : Total 6 Click Score: 15    End of Session Equipment Utilized During Treatment: Gait belt Activity Tolerance: Patient tolerated treatment well Patient left: in chair;with call bell/phone within reach Nurse Communication: Mobility status PT Visit Diagnosis: Other abnormalities of gait and mobility (R26.89);Pain Pain - Right/Left: Left Pain - part of body: Leg    Time: [id number] PT Time Calculation (min) (ACUTE ONLY): 26 min   Charges:   PT Evaluation $PT Eval Moderate Complexity: 1 Mod          Eduard Clos, PT, DPT  Acute Rehabilitation Services Pager [phone] Office [place] 05/17/2019, 1:17 PM

## 2019-05-17 NOTE — Evaluation (Signed)
Occupational Therapy Evaluation Patient Details Name: Olivia Romero MRN: [id number] DOB: 12/17/1955 Today's Date: 05/17/2019    History of Present Illness This 63 yo female admitted for transtibial amputation secondary to osteomyelitis with cavovarus collapse left foot.  PMH includes: uncontrolled DM, CVA, sleep apnea, PNA, neuropathy bil. feet and hand, HTN, s/p cervical fusion, s/p partial amputation Lt foot, s/p back surgery   Clinical Impression   Pt was functioning modified independently in ADL and some IADL from a w/c level primarily, but she was able to ambulate some with a RW. Pt presents with impaired balance and post operative pain. She was able to stand and use RW to hop and transfer bed>chair with 2 person min assist. Pt tearful at times, does not want to look at her residual limb yet. Pt requires min guard to mod assist for ADL. She has excellent potential to return to modified independence with intensive rehab. Recommending CIR. Will follow acutely.    Follow Up Recommendations  CIR    Equipment Recommendations  Tub/shower bench    Recommendations for Other Services Rehab consult     Precautions / Restrictions Precautions Precautions: Fall Required Braces or Orthoses: Other Brace Other Brace: limb protector Restrictions Weight Bearing Restrictions: Yes LLE Weight Bearing: Non weight bearing      Mobility Bed Mobility Overal bed mobility: Needs Assistance Bed Mobility: Supine to Sit     Supine to sit: Min guard     General bed mobility comments: use of rail and momentum, no physical assist  Transfers Overall transfer level: Needs assistance Equipment used: Rolling walker (2 wheeled) Transfers: Sit to/from [facility] Sit to Stand: +2 physical assistance;Min assist Stand pivot transfers: +2 physical assistance;Min assist       General transfer comment: assist to rise and steady, hopped on R foot to pivot to chair    Balance  Overall balance assessment: Needs assistance   Sitting balance-Leahy Scale: Fair       Standing balance-Leahy Scale: Poor                             ADL either performed or assessed with clinical judgement   ADL Overall ADL's : Needs assistance/impaired Eating/Feeding: Independent   Grooming: Set up;Sitting;Oral care   Upper Body Bathing: Min guard;Sitting   Lower Body Bathing: Moderate assistance;Sitting/lateral leans   Upper Body Dressing : Min guard;Sitting   Lower Body Dressing: Moderate assistance;Sitting/lateral leans   Toilet Transfer: +2 for physical assistance;Minimal assistance;Squat-pivot;BSC;RW             General ADL Comments: began educating pt in compensatory strategies for ADL leaning side to side     Vision Baseline Vision/History: Wears glasses Patient Visual Report: No change from baseline       Perception     Praxis      Pertinent Vitals/Pain Pain Assessment: 0-10 Pain Score: 6  Pain Location: L LE Pain Descriptors / Indicators: Sore Pain Intervention(s): Monitored during session;Repositioned     Hand Dominance Right   Extremity/Trunk Assessment Upper Extremity Assessment Upper Extremity Assessment: Overall WFL for tasks assessed   Lower Extremity Assessment Lower Extremity Assessment: Defer to PT evaluation   Cervical / Trunk Assessment Cervical / Trunk Assessment: Other exceptions Cervical / Trunk Exceptions: obesity   Communication Communication Communication: No difficulties   Cognition Arousal/Alertness: Awake/alert Behavior During Therapy: WFL for tasks assessed/performed Overall Cognitive Status: Within Functional Limits for tasks assessed  General Comments       Exercises     Shoulder Instructions      Home Living Family/patient expects to be discharged to:: Private residence Living Arrangements: Children(daughter in law has back problems, cannot  lift) Available Help at Discharge: Family;Available 24 hours/day Type of Home: House Home Access: Ramped entrance     Home Layout: One level     Bathroom Shower/Tub: Teacher, early years/pre: Standard     Home Equipment: Environmental consultant - 4 wheels;Adaptive equipment;Crutches;Walker - 2 wheels;Wheelchair - Technical sales engineer: Other (Comment);Reacher(toilet aide)        Prior Functioning/Environment Level of Independence: Independent with assistive device(s)        Comments: walking some, depending on the day with RW, could bathe and dress, used toilet aide, could even do some cleaning from w/c level         OT Problem List: Impaired balance (sitting and/or standing);Decreased knowledge of use of DME or AE      OT Treatment/Interventions: Self-care/ADL training;DME and/or AE instruction;Patient/family education;Balance training    OT Goals(Current goals can be found in the care plan section) Acute Rehab OT Goals Patient Stated Goal: to be independent before going home OT Goal Formulation: With patient Time For Goal Achievement: 05/31/19 Potential to Achieve Goals: Good ADL Goals Pt Will Perform Lower Body Bathing: with modified independence;sitting/lateral leans Pt Will Perform Lower Body Dressing: with modified independence;sitting/lateral leans Pt Will Transfer to Toilet: with modified independence;ambulating;bedside commode Pt Will Perform Toileting - Clothing Manipulation and hygiene: sitting/lateral leans;with modified independence Pt Will Perform Tub/Shower Transfer: Tub transfer;with modified independence;rolling walker;ambulating;tub bench  OT Frequency: Min 2X/week   Barriers to D/C:            Co-evaluation PT/OT/SLP Co-Evaluation/Treatment: Yes Reason for Co-Treatment: For patient/therapist safety   OT goals addressed during session: ADL's and self-care;Proper use of Adaptive equipment and DME      AM-PAC OT "6 Clicks" Daily  Activity     Outcome Measure Help from another person eating meals?: None Help from another person taking care of personal grooming?: None Help from another person toileting, which includes using toliet, bedpan, or urinal?: A Little Help from another person bathing (including washing, rinsing, drying)?: A Lot Help from another person to put on and taking off regular upper body clothing?: A Little Help from another person to put on and taking off regular lower body clothing?: A Lot 6 Click Score: 18   End of Session Equipment Utilized During Treatment: Gait belt;Rolling walker;Other (comment)(limb protector) Nurse Communication: Mobility status  Activity Tolerance: Patient tolerated treatment well Patient left: in chair;with call bell/phone within reach  OT Visit Diagnosis: Unsteadiness on feet (R26.81);Other abnormalities of gait and mobility (R26.89);Pain                Time: [id number] OT Time Calculation (min): 34 min Charges:  OT General Charges $OT Visit: 1 Visit OT Evaluation $OT Eval Moderate Complexity: 1 Mod  Nestor Lewandowsky, OTR/L Acute Rehabilitation Services Pager: [phone] Office: [phone]  Malka So 05/17/2019, 11:31 AM

## 2019-05-17 NOTE — Plan of Care (Signed)
  Problem: Activity: Goal: Risk for activity intolerance will decrease Outcome: Progressing   Problem: Safety: Goal: Ability to remain free from injury will improve Outcome: Progressing   

## 2019-05-17 NOTE — Progress Notes (Signed)
Patient ID: Olivia Romero, female   DOB: 10-Feb-1956, 63 y.o.   MRN: [id number] Postoperative day 1 left transtibial amputation.  No drainage in the wound VAC canister patient without complaints this morning.  [facility] has provided a limb protector.  Anticipate discharge to inpatient versus outpatient rehab.

## 2019-05-17 NOTE — Plan of Care (Signed)
  Problem: Skin Integrity: Goal: Risk for impaired skin integrity will decrease Outcome: Progressing   Problem: Pain Managment: Goal: General experience of comfort will improve Outcome: Progressing   Problem: Elimination: Goal: Will not experience complications related to bowel motility Outcome: Progressing   Problem: Safety: Goal: Ability to remain free from injury will improve Outcome: Progressing

## 2019-05-17 NOTE — Progress Notes (Signed)
CSW acknowledges consult for SNF. OT/PT is recommending CIR at this time. Please re-consult if disposition plan changes. Consult is being closed out. 

## 2019-05-18 LAB — GLUCOSE, CAPILLARY
Glucose-Capillary: 71 mg/dL (ref 70–99)
Glucose-Capillary: 75 mg/dL (ref 70–99)
Glucose-Capillary: 107 mg/dL — ABNORMAL HIGH (ref 70–99)
Glucose-Capillary: 182 mg/dL — ABNORMAL HIGH (ref 70–99)

## 2019-05-18 NOTE — Progress Notes (Signed)
Subjective: 2 Days Post-Op Procedure(s) (LRB): LEFT BELOW KNEE AMPUTATION (Left) Patient reports pain as mild to moderate.  No complaints oterwise.   Objective: Vital signs in last 24 hours: Temp:  [97.6 F (36.4 C)-98.2 F (36.8 C)] 98.1 F (36.7 C) (12/13 0841) Pulse Rate:  [77-91] 91 (12/13 0841) Resp:  [17-18] 18 (12/13 0423) BP: (117-131)/(49-60) 131/60 (12/13 0841) SpO2:  [96 %-100 %] 98 % (12/13 0841)  Intake/Output from previous day: 12/12 0701 - 12/13 0700 In: 588 [P.O.:588] Out: -  Intake/Output this shift: No intake/output data recorded.  Recent Labs    05/16/19 0635  HGB 11.2*   Recent Labs    05/16/19 0635  WBC 8.4  RBC 4.35  HCT 36.1  PLT 310   Recent Labs    05/16/19 0635  NA 138  K 4.7  CL 104  CO2 22  BUN 17  CREATININE 0.84  GLUCOSE 153*  CALCIUM 9.5   No results for input(s): LABPT, INR in the last 72 hours.  Left lower leg: Incision: dressing C/D/I Compartment soft   Assessment/Plan: 2 Days Post-Op Procedure(s) (LRB): LEFT BELOW KNEE AMPUTATION (Left) Up with therapy  Dispo inpatient vs outpatient rehab      [facility] 05/18/2019, 9:53 AM

## 2019-05-18 NOTE — Plan of Care (Signed)
  Problem: Health Behavior/Discharge Planning: Goal: Ability to manage health-related needs will improve Outcome: Progressing   Problem: Activity: Goal: Risk for activity intolerance will decrease Outcome: Progressing   

## 2019-05-18 NOTE — Anesthesia Postprocedure Evaluation (Signed)
Anesthesia Post Note  Patient: Olivia Romero  Procedure(s) Performed: LEFT BELOW KNEE AMPUTATION (Left Knee)     Anesthesia Post Evaluation  Last Vitals:  Vitals:   05/18/19 0841 05/18/19 1422  BP: 131/60 (!) 111/52  Pulse: 91 76  Resp:    Temp: 36.7 C 36.5 C  SpO2: 98% 100%    Last Pain:  Vitals:   05/18/19 1720  TempSrc:   PainSc: 2                  Kennith Morss

## 2019-05-18 NOTE — Progress Notes (Signed)
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  PMH includes: uncontrolled DM, CVA, sleep apnea, PNA, neuropathy bil. feet and hand, HTN, s/p cervical fusion, s/p partial amputation Lt foot, s/p back surgery    PT Comments    Pt performed supine and seated LE exercises in recliner chair.  Focus of session on exercises as she had just transferred from bed to recliner with RN staff.  Pt required AAROM on L side.  Pt fatigues quickly and required several rest periods due to pain.  She remains an excellent candidate for intensive therapies at CIR.  Plan next session for progression of functional mobility.      Follow Up Recommendations  CIR     Equipment Recommendations  None recommended by PT    Recommendations for Other Services Rehab consult     Precautions / Restrictions Precautions Precautions: Fall Precaution Comments: wound vac Required Braces or Orthoses: Other Brace Other Brace: limb protector Restrictions Weight Bearing Restrictions: Yes LLE Weight Bearing: Non weight bearing    Mobility  Bed Mobility               General bed mobility comments: Pt has just transferred to chair with nursing staff so focused on LE strengthening.  Transfers                    Ambulation/Gait                 Stairs             Wheelchair Mobility    Modified Rankin (Stroke Patients Only)       Balance                                            Cognition Arousal/Alertness: Awake/alert Behavior During Therapy: WFL for tasks assessed/performed Overall Cognitive Status: Within Functional Limits for tasks assessed                                        Exercises General Exercises - Lower  Extremity Quad Sets: AROM;Both;10 reps;Supine Long Arc Quad: AROM;Both;10 reps;Seated Hip ABduction/ADduction: AROM;Both;10 reps;Supine Straight Leg Raises: AROM;Both;10 reps;Supine;AAROM Other Exercises Other Exercises: 2x5 reps of chair push-ups.    General Comments        Pertinent Vitals/Pain Pain Assessment: 0-10 Pain Score: 6  Pain Location: L LE reports phatom pain to fit. Pain Descriptors / Indicators: Sore Pain Intervention(s): Monitored during session;Repositioned    Home Living                      Prior Function            PT Goals (current goals can now be found in the care plan section) Acute Rehab PT Goals Patient Stated Goal: to be more independent before going home Potential to Achieve Goals: Good Progress towards PT goals: Progressing toward goals    Frequency    Min 5X/week      PT Plan Current plan remains appropriate    Co-evaluation  AM-PAC PT "6 Clicks" Mobility   Outcome Measure  Help needed turning from your back to your side while in a flat bed without using bedrails?: None Help needed moving from lying on your back to sitting on the side of a flat bed without using bedrails?: None Help needed moving to and from a bed to a chair (including a wheelchair)?: A Lot Help needed standing up from a chair using your arms (e.g., wheelchair or bedside chair)?: A Little Help needed to walk in hospital room?: Total Help needed climbing 3-5 steps with a railing? : Total 6 Click Score: 15    End of Session Equipment Utilized During Treatment: Gait belt Activity Tolerance: Patient tolerated treatment well Patient left: in chair;with call bell/phone within reach Nurse Communication: Mobility status PT Visit Diagnosis: Other abnormalities of gait and mobility (R26.89);Pain Pain - Right/Left: Left Pain - part of body: Leg     Time: [id number] PT Time Calculation (min) (ACUTE ONLY): 24 min  Charges:  $Therapeutic  Exercise: 23-37 mins                     Erasmo Leventhal , PTA Acute Rehabilitation Services Pager [phone] Office [phone]     Emmerson Taddei Eli Hose 05/18/2019, 4:03 PM

## 2019-05-19 LAB — GLUCOSE, CAPILLARY
Glucose-Capillary: 188 mg/dL — ABNORMAL HIGH (ref 70–99)
Glucose-Capillary: 78 mg/dL (ref 70–99)
Glucose-Capillary: 99 mg/dL (ref 70–99)
Glucose-Capillary: 80 mg/dL (ref 70–99)

## 2019-05-19 LAB — SURGICAL PATHOLOGY

## 2019-05-19 NOTE — Plan of Care (Signed)

## 2019-05-19 NOTE — Plan of Care (Signed)

## 2019-05-19 NOTE — Progress Notes (Signed)
VSS afebrile.  0cc in vac. Very concerned about going home as she lives in an old house that would be difficult to [facility] around. Also has very limited help,  Reviewed Therapy notes which support CIR. Consulted. Can discharge as soon as CIR approved

## 2019-05-19 NOTE — Progress Notes (Signed)
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  PMH includes: uncontrolled DM, CVA, sleep apnea, PNA, neuropathy bil. feet and hand, HTN, s/p cervical fusion, s/p partial amputation Lt foot, s/p back surgery    PT Comments    Pt demonstrating improved transfers - progressed from assist of 2 to min A of 1.  She was able to hop or pivot on R LE 2' with RW to the bed.  RW in room is too tall, may be able to do more with shorter walker.  Pt did well with exercises - cued for technique and limited by pain.  Gave HEP handout.    Follow Up Recommendations  CIR     Equipment Recommendations  None recommended by PT    Recommendations for Other Services       Precautions / Restrictions Precautions Precautions: Fall Precaution Comments: wound vac Required Braces or Orthoses: Other Brace Other Brace: limb protector Restrictions LLE Weight Bearing: Non weight bearing    Mobility  Bed Mobility Overal bed mobility: Needs Assistance Bed Mobility: Sit to Supine       Sit to supine: Min assist   General bed mobility comments: in chair at arrival  Transfers Overall transfer level: Needs assistance Equipment used: Rolling walker (2 wheeled) Transfers: Sit to/from Stand Sit to Stand: Min assist         General transfer comment: cues for safe hand placement (had assist of 2 for safety today)  Ambulation/Gait Ambulation/Gait assistance: Min assist Gait Distance (Feet): 2 Feet Assistive device: Rolling walker (2 wheeled)       General Gait Details: able to hop 2-3 steps but then finished pivot to bed on R LE; cued for RW use; RW in room is too tall (may do more with shorter walker)   Stairs             Wheelchair Mobility    Modified Rankin (Stroke  Patients Only)       Balance Overall balance assessment: Needs assistance Sitting-balance support: Feet supported;No upper extremity supported Sitting balance-Leahy Scale: Good     Standing balance support: Bilateral upper extremity supported Standing balance-Leahy Scale: Fair Standing balance comment: stood 3 mins with RW without LOB                            Cognition Arousal/Alertness: Awake/alert Behavior During Therapy: WFL for tasks assessed/performed Overall Cognitive Status: Within Functional Limits for tasks assessed                                 General Comments: -Pt struggled when saying word "amputation" but otherwise tolerated therapy well      Exercises Amputee Exercises Quad Sets: AROM;Both;10 reps;Supine Gluteal Sets: AROM;Both;10 reps;Supine Hip ABduction/ADduction: AROM;10 reps;Supine Knee Flexion: AROM;Right;10 reps(limited range due to pain) Straight Leg Raises: AROM;Right;AAROM;Left;5 reps;Supine  Discussed importance of resting with leg straight - pt states she prefers this and even prefers pillow just under the end of residual limb which keeps leg straight.     General Comments        Pertinent Vitals/Pain Pain Assessment: Faces Faces Pain Scale: Hurts little more Pain Location: some pain with exercises L LE but no  pain rest Pain Intervention(s): Limited activity within patient's tolerance    Home Living                      Prior Function            PT Goals (current goals can now be found in the care plan section) Progress towards PT goals: Progressing toward goals    Frequency    Min 5X/week      PT Plan Current plan remains appropriate    Co-evaluation              AM-PAC PT "6 Clicks" Mobility   Outcome Measure  Help needed turning from your back to your side while in a flat bed without using bedrails?: None Help needed moving from lying on your back to sitting on the side of a  flat bed without using bedrails?: None Help needed moving to and from a bed to a chair (including a wheelchair)?: A Little Help needed standing up from a chair using your arms (e.g., wheelchair or bedside chair)?: A Little Help needed to walk in hospital room?: A Lot Help needed climbing 3-5 steps with a railing? : Total 6 Click Score: 17    End of Session Equipment Utilized During Treatment: Gait belt Activity Tolerance: Patient tolerated treatment well Patient left: in bed;with call bell/phone within reach;with bed alarm set Nurse Communication: Mobility status PT Visit Diagnosis: Other abnormalities of gait and mobility (R26.89);Pain     Time: [id number] PT Time Calculation (min) (ACUTE ONLY): 24 min  Charges:  $Therapeutic Exercise: 8-22 mins $Therapeutic Activity: 8-22 mins                     Maggie Font, PT Acute Rehab Services Pager [phone] [facility] [phone] [facility] [phone]    Karlton Lemon 05/19/2019, 4:50 PM

## 2019-05-19 NOTE — Progress Notes (Addendum)
Inpatient Rehabilitation Admissions Coordinator  Inpatient rehab consult received. I met with patient at bedside for rehab assessment. She was at [facility] rehab in July and would like SW to clarify if bed available there this week. I do not have a CIR bed today for this patient. She would like to look into [facility] and I will follow up tomorrow to clarify if there will be a chance at admitting to CIR or not in next 24 to 48 hrs. I have alerted SW, Shanita.  Danne Baxter, RN, MSN Rehab Admissions Coordinator [phone] 05/19/2019 12:47 PM

## 2019-05-20 LAB — SARS CORONAVIRUS 2 (TAT 6-24 HRS): SARS Coronavirus 2: NEGATIVE

## 2019-05-20 LAB — GLUCOSE, CAPILLARY
Glucose-Capillary: 99 mg/dL (ref 70–99)
Glucose-Capillary: 134 mg/dL — ABNORMAL HIGH (ref 70–99)
Glucose-Capillary: 58 mg/dL — ABNORMAL LOW (ref 70–99)
Glucose-Capillary: 94 mg/dL (ref 70–99)
Glucose-Capillary: 211 mg/dL — ABNORMAL HIGH (ref 70–99)

## 2019-05-20 NOTE — Progress Notes (Signed)
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  PMH includes: uncontrolled DM, CVA, sleep apnea, PNA, neuropathy bil. feet and hand, HTN, s/p cervical fusion, s/p partial amputation Lt foot, s/p back surgery    PT Comments    Patient received in recliner, reports pain in left leg, states she has asked for pain medicine, but agrees to PT. Patient instructed on LE exercises, then was able to transfer sit to stand with supervision. Cues needed for hand placement. She was able to ambulate about 5 feet using rolling walker and min guard. After brief seated rest she ambulated another 3 feet. Limited by BUE and right LE fatigue. She will continue to benefit from skilled PT while here to improve endurance and strength.       Follow Up Recommendations  SNF     Equipment Recommendations  None recommended by PT    Recommendations for Other Services       Precautions / Restrictions Precautions Precaution Comments: mod fall, wound vac Required Braces or Orthoses: Other Brace Other Brace: limb protector Restrictions Weight Bearing Restrictions: Yes LLE Weight Bearing: Non weight bearing    Mobility  Bed Mobility               General bed mobility comments: in chair at arrival  Transfers Overall transfer level: Needs assistance Equipment used: Rolling walker (2 wheeled) Transfers: Sit to/from Stand Sit to Stand: Supervision         General transfer comment: cues to push up from recliner  Ambulation/Gait Ambulation/Gait assistance: Min guard Gait Distance (Feet): 10 Feet Assistive device: Rolling walker (2 wheeled) Gait Pattern/deviations: Step-to pattern Gait velocity: decr   General Gait Details: able to hop 6 steps, then after seated rest, hopped  another 4 steps.   Stairs             Wheelchair Mobility    Modified Rankin (Stroke Patients Only)       Balance Overall balance assessment: Modified Independent Sitting-balance support: Feet supported Sitting balance-Leahy Scale: Good     Standing balance support: Bilateral upper extremity supported;During functional activity Standing balance-Leahy Scale: Good Standing balance comment: steady when using rolling walker                            Cognition Arousal/Alertness: Awake/alert Behavior During Therapy: WFL for tasks assessed/performed Overall Cognitive Status: Within Functional Limits for tasks assessed                                        Exercises Amputee Exercises Quad Sets: AROM;10 reps;Left Hip ABduction/ADduction: AROM;10 reps;Left Straight Leg Raises: AROM;10 reps;Left    General Comments        Pertinent Vitals/Pain Pain Score: 6  Pain Location: feels like her left leg is cramping Pain Descriptors / Indicators: Cramping;Grimacing;Guarding;Shooting Pain Intervention(s): Monitored during session;Repositioned;RN gave pain meds during session    Home Living                      Prior Function            PT Goals (current goals can now be found in the care plan  section) Acute Rehab PT Goals Patient Stated Goal: to be more independent before going home PT Goal Formulation: With patient Time For Goal Achievement: 05/31/19 Potential to Achieve Goals: Good Progress towards PT goals: Progressing toward goals    Frequency    Min 3X/week      PT Plan Frequency needs to be updated;Discharge plan needs to be updated    Co-evaluation              AM-PAC PT "6 Clicks" Mobility   Outcome Measure  Help needed turning from your back to your side while in a flat bed without using bedrails?: None Help needed moving from lying on your back to sitting on the side of a flat bed without using  bedrails?: A Little Help needed moving to and from a bed to a chair (including a wheelchair)?: A Little Help needed standing up from a chair using your arms (e.g., wheelchair or bedside chair)?: A Little Help needed to walk in hospital room?: A Lot Help needed climbing 3-5 steps with a railing? : Total 6 Click Score: 16    End of Session Equipment Utilized During Treatment: Gait belt Activity Tolerance: Patient tolerated treatment well;Patient limited by fatigue Patient left: in chair;with call bell/phone within reach Nurse Communication: Mobility status PT Visit Diagnosis: Difficulty in walking, not elsewhere classified (R26.2);Pain Pain - Right/Left: Left Pain - part of body: Leg     Time: 1215-1239 PT Time Calculation (min) (ACUTE ONLY): 24 min  Charges:  $Gait Training: 8-22 mins $Therapeutic Exercise: 8-22 mins                     Shaconda Hajduk, PT, GCS 05/20/19,12:51 PM

## 2019-05-20 NOTE — Plan of Care (Signed)

## 2019-05-20 NOTE — Progress Notes (Signed)
Hypoglycemic Event  CBG: 58  Treatment: Orange Juice  Symptoms: None  Follow-up CBG: Time:1714 CBG Result:94  Possible Reasons for Event: Meds/Insulin  Comments/MD notified: Patient eating dinner.    Royston Cowper Janelis Stelzer RN

## 2019-05-20 NOTE — Progress Notes (Signed)
Inpatient Rehabilitation Admissions Coordinator  CIR/inpt rehab bed is not available for this pt and she prefers SNF at [facility] where she was in July. I have alerted ortho PA as well as SW, Shanita. We will sign off at this time.  Danne Baxter, RN, MSN Rehab Admissions Coordinator [phone] 05/20/2019 10:07 AM

## 2019-05-20 NOTE — Progress Notes (Signed)
Patient has good pain control. VSS  Good glycemic control. 0cc in [facility]  Patient very concerned about being home. Would like to go to CIR if bed available but is worried that length of stay may be too short for her to be ready to go home.   COVID ordered today in case disposition changes to SNF. Patient is ready to transfer to either.

## 2019-05-20 NOTE — NC FL2 (Signed)
[place] LEVEL OF CARE SCREENING TOOL     IDENTIFICATION  Patient Name: Olivia Romero Birthdate: 1955-12-23 Sex: female Admission Date (Current Location): 05/16/2019  [facility] and [place] Number:  Herbalist and Address:  [facility], [place] [address], [place], [place] [address]      Provider Number: [id number]  Attending Physician Name and Address:  Newt Minion, MD  Relative Name and Phone Number:  Garrel Ridgel [phone]    Current Level of Care: Hospital Recommended Level of Care: [place] Prior Approval Number:    Date Approved/Denied:   PASRR Number: [id number]  Discharge Plan: SNF    Current Diagnoses: Patient Active Problem List   Diagnosis Date Noted  . Gangrene of left foot ([place]) 05/16/2019  . Subacute osteomyelitis of left foot ([place])   . Charcot's joint of foot, left   . Post-operative state   . PAD (peripheral artery disease) ([place]) 12/05/2018  . Osteomyelitis of ankle or foot, acute, left ([place]) 12/05/2018  . MRSA bacteremia 12/05/2018  . Sepsis without acute organ dysfunction ([place])   . Charcot's joint of foot due to diabetes ([place]) 09/18/2018  . BMI 36.0-36.9,adult   . Morbid obesity ([place]) 11/19/2017  . B12 deficiency 11/01/2017  . Diabetic foot ulcer ([place]) 12/14/2016  . Diabetic polyneuropathy associated with type 2 diabetes mellitus ([place]) 12/13/2015  . Lumbar stenosis with neurogenic claudication 09/02/2015  . Chronic constipation 07/13/2015  . OSA (obstructive sleep apnea) 06/25/2015  . 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/23/2015  . Hypertriglyceridemia 04/23/2015  . Asthma, mild intermittent 02/01/2015  . Type 2 diabetes mellitus with diabetic nephropathy ([place]) 12/31/2014  . Gastroesophageal reflux disease with esophagitis 12/31/2014  . Dysphagia 12/31/2014  . Bilateral carpal tunnel syndrome 12/01/2014  . Diverticulosis of colon (without mention of  hemorrhage) 11/30/2012  . Essential hypertension, benign 11/28/2012    Orientation RESPIRATION BLADDER Height & Weight     Self, Time, Situation, Place  Normal Continent Weight: (!) 410 lb 0.9 oz (186 kg) Height:  5' (152.4 cm)  BEHAVIORAL SYMPTOMS/MOOD NEUROLOGICAL BOWEL NUTRITION STATUS      Continent Diet(see discharge summary)  AMBULATORY STATUS COMMUNICATION OF NEEDS Skin   Extensive Assist Verbally Normal, Bruising                       Personal Care Assistance Level of Assistance  Bathing, Feeding, Dressing Bathing Assistance: Maximum assistance Feeding assistance: Maximum assistance Dressing Assistance: Maximum assistance     Functional Limitations Info             SPECIAL CARE FACTORS FREQUENCY  PT (By licensed PT), OT (By licensed OT)     PT Frequency: 5 times a week OT Frequency: 5 times a week            Contractures      Additional Factors Info  Code Status Code Status Info: Full             Current Medications (05/20/2019):  This is the current hospital active medication list Current Facility-Administered Medications  Medication Dose Route Frequency Provider Last Rate Last Admin  . 0.9 %  sodium chloride infusion   Intravenous Continuous Persons, Bevely Palmer, [place] 75 mL/hr at 05/17/19 0100 Rate Verify at 05/17/19 0100  . acetaminophen (TYLENOL) tablet 325-650 mg  325-650 mg Oral Q6H PRN Persons, Bevely Palmer, [place]   650 mg at 05/18/19 2204  . albuterol (PROVENTIL) (2.5 MG/3ML) 0.083%  nebulizer solution 2.5 mg  2.5 mg Nebulization Q6H PRN Newt Minion, MD      . aspirin EC tablet 325 mg  325 mg Oral Daily Persons, Bevely Palmer, [place]   325 mg at 05/20/19 0836  . bisacodyl (DULCOLAX) suppository 10 mg  10 mg Rectal Daily PRN Persons, Bevely Palmer, PA      . docusate sodium (COLACE) capsule 100 mg  100 mg Oral BID Persons, Bevely Palmer, PA   100 mg at 05/20/19 0836  . DULoxetine (CYMBALTA) DR capsule 60 mg  60 mg Oral QHS Persons, Bevely Palmer, [place]   60 mg at  05/19/19 2158  . fenofibrate tablet 54 mg  54 mg Oral Daily Persons, Bevely Palmer, [place]   54 mg at 05/20/19 0836  . gabapentin (NEURONTIN) capsule 800 mg  800 mg Oral TID Persons, Bevely Palmer, PA   800 mg at 05/20/19 0836  . glimepiride (AMARYL) tablet 2 mg  2 mg Oral BID WC Persons, Bevely Palmer, PA   2 mg at 05/20/19 [id number]  . hydrochlorothiazide (HYDRODIURIL) tablet 12.5 mg  12.5 mg Oral Daily Persons, Bevely Palmer, PA   12.5 mg at 05/20/19 0837  . HYDROmorphone (DILAUDID) injection 0.5 mg  0.5 mg Intravenous Q4H PRN Persons, Bevely Palmer, PA   0.5 mg at 05/19/19 0227  . insulin aspart (novoLOG) injection 0-15 Units  0-15 Units Subcutaneous TID WC Persons, Bevely Palmer, [place]   3 Units at 05/19/19 [phone]  . insulin aspart (novoLOG) injection 4 Units  4 Units Subcutaneous TID WC Persons, Bevely Palmer, [place]   4 Units at 05/20/19 0840  . insulin glargine (LANTUS) injection 16 Units  16 Units Subcutaneous QHS Newt Minion, MD   16 Units at 05/18/19 2204  . metFORMIN (GLUCOPHAGE-XR) 24 hr tablet 500 mg  500 mg Oral TID WC Persons, Bevely Palmer, PA   500 mg at 05/20/19 0836  . metoCLOPramide (REGLAN) tablet 5-10 mg  5-10 mg Oral Q8H PRN Persons, Bevely Palmer, PA       Or  . metoCLOPramide (REGLAN) injection 5-10 mg  5-10 mg Intravenous Q8H PRN Persons, Bevely Palmer, PA      . ondansetron [facility]) tablet 4 mg  4 mg Oral Q6H PRN Persons, Bevely Palmer, PA       Or  . ondansetron [facility]) injection 4 mg  4 mg Intravenous Q6H PRN Persons, Bevely Palmer, PA      . oxyCODONE (Oxy IR/ROXICODONE) immediate release tablet 5-10 mg  5-10 mg Oral Q4H PRN Persons, Bevely Palmer, PA   10 mg at 05/20/19 0837  . senna-docusate (Senokot-S) tablet 1 tablet  1 tablet Oral QHS PRN Persons, Bevely Palmer, [place]         Discharge Medications: Please see discharge summary for a list of discharge medications.  Relevant Imaging Results:  Relevant Lab Results:   Additional Information SSN: [id number]  Atilano Median, LCSW

## 2019-05-21 LAB — GLUCOSE, CAPILLARY: Glucose-Capillary: 99 mg/dL (ref 70–99)

## 2019-05-21 MED ORDER — OXYCODONE HCL 5 MG PO TABS: 5.0000 mg | ORAL_TABLET | ORAL | 0 refills | Status: DC | PRN

## 2019-05-21 NOTE — Care Management Important Message (Signed)
Important Message  Patient Details  Name: Olivia Romero MRN: [id number] Date of Birth: [birth year]   Medicare Important Message Given:  Yes     Memory Argue 05/21/2019, 1:53 PM    IM MAILED TO PATIENT

## 2019-05-21 NOTE — Discharge Summary (Signed)
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 female who was admitted 05/16/2019 with a chief complaint of left foot osteomyelitis, with a final diagnosis of Abscess and Deformity Left Foot.  Patient was brought to the operating room on 05/16/2019 and underwent Procedure(s): LEFT BELOW KNEE AMPUTATION.    Patient was given perioperative antibiotics:  Anti-infectives (From admission, onward)   Start     Dose/Rate Route Frequency Ordered Stop   05/16/19 1600  ceFAZolin (ANCEF) IVPB 2g/100 mL premix     2 g 200 mL/hr over 30 Minutes Intravenous Every 6 hours 05/16/19 1523 05/17/19 0422   05/16/19 0600  ceFAZolin (ANCEF) IVPB 2g/100 mL premix     2 g 200 mL/hr over 30 Minutes Intravenous On call to O.R. 05/16/19 3428 05/16/19 0815    .  Patient was given sequential compression devices, early ambulation, and aspirin for DVT prophylaxis.  Recent vital signs:  Patient Vitals for the past 24 hrs:  BP Temp Temp src Pulse Resp SpO2  05/21/19 0826 116/61 99.1 F (37.3 C) Oral 85 17 100 %  05/21/19 0338 (!) 123/57 98.1 F (36.7 C) Oral 80 17 96 %  05/20/19 1931 (!) 116/58 98.4 F (36.9 C) Oral 84 17 97 %  05/20/19 1313 (!) 106/58 -- -- 71 18 97 %  .  Recent laboratory studies: No results found.  Discharge Medications:   Allergies as of 05/21/2019      Reactions   Eggs Or Egg-derived Products Swelling, Other (See Comments)   Angioedema   Atorvastatin Other (See Comments)   Liver toxicity   Flu Virus Vaccine Swelling   Arm swelled (site of injection)   Latex Itching   Pravastatin Itching, Rash   Tape Rash, Other (See Comments)   Adhesive on foot pull off skin.  Ok to use paper tape.      Medication List    STOP taking these medications    cephALEXin 500 MG capsule Commonly known as: KEFLEX   clindamycin 300 MG capsule Commonly known as: Cleocin   diclofenac sodium 1 % Gel Commonly known as: VOLTAREN   mupirocin ointment 2 % Commonly known as: BACTROBAN   sulfamethoxazole-trimethoprim 800-160 MG tablet Commonly known as: BACTRIM DS     TAKE these medications   acetaminophen 500 MG tablet Commonly known as: TYLENOL Take 1,000 mg by mouth every 6 (six) hours as needed for mild pain. Maximum of 3,000 mg per day   Albuterol Sulfate 108 (90 Base) MCG/ACT Aepb Commonly known as: ProAir RespiClick Inhale 1-2 puffs into the lungs every 6 (six) hours as needed (Shortness of breath or wheezing).   ALKA-SELTZER PLUS COLD PO Take 2 tablets by mouth as needed (allergies). Day and night time   aspirin EC 325 MG tablet Take 1 tablet (325 mg total) by mouth daily.   Contour Next Monitor w/Device Kit as directed.   DULoxetine 60 MG capsule Commonly known as: CYMBALTA Take 60 mg by mouth at bedtime.   fenofibrate 145 MG tablet Commonly known as: TRICOR Take 145 mg by mouth daily.   gabapentin 400 MG capsule Commonly known as: NEURONTIN Take 800 mg by mouth 3 (three) times daily.   glimepiride 2 MG tablet Commonly known as: AMARYL Take 2 mg by mouth  2 (two) times daily.   hydrochlorothiazide 12.5 MG tablet Commonly known as: HYDRODIURIL Take 12.5 mg by mouth daily. What changed: Another medication with the same name was removed. Continue taking this medication, and follow the directions you see here.   metFORMIN 500 MG 24 hr tablet Commonly known as: GLUCOPHAGE-XR Take 500 mg by mouth 3 (three) times daily.   oxyCODONE 5 MG immediate release tablet Commonly known as: Oxy IR/ROXICODONE Take 1-2 tablets (5-10 mg total) by mouth every 4 (four) hours as needed for moderate pain (pain score 4-6). What changed: See the new instructions.   pantoprazole 40 MG tablet Commonly known as: PROTONIX Take 1 tablet (40  mg total) by mouth daily. What changed:   when to take this  reasons to take this   [place] FlexTouch 200 UNIT/ML Sopn Generic drug: Insulin Degludec Inject 20 Units into the skin at bedtime.   UltiCare Short Pen Needles 31G X 8 MM Misc Generic drug: Insulin Pen Needle       Diagnostic Studies: CT Head Wo Contrast  Result Date: 05/05/2019 CLINICAL DATA:  Fall EXAM: CT HEAD WITHOUT CONTRAST TECHNIQUE: Contiguous axial images were obtained from the base of the skull through the vertex without intravenous contrast. COMPARISON:  August 14, 2014 FINDINGS: Brain: There is no acute intracranial hemorrhage, mass-effect, or edema. Gray-white differentiation is preserved. There is no extra-axial fluid collection. Ventricles and sulci are within normal limits in size and configuration. Vascular: There is mild atherosclerotic calcification at the skull base. Skull: Calvarium is unremarkable. Sinuses/Orbits: No acute finding. Other: Right anterior frontal scalp hematoma. IMPRESSION: No evidence of acute intracranial injury. Electronically Signed   By: Macy Mis M.D.   On: 05/05/2019 15:36   CT CERVICAL SPINE WO CONTRAST  Result Date: 05/05/2019 CLINICAL DATA:  Fall EXAM: CT CERVICAL SPINE WITHOUT CONTRAST TECHNIQUE: Multidetector CT imaging of the cervical spine was performed without intravenous contrast. Multiplanar CT image reconstructions were also generated. COMPARISON:  None. FINDINGS: Alignment: Non-specific straightening of the cervical lordosis. There is mild anterolisthesis at C4-C5. Skull base and vertebrae: Postsurgical changes of anterior fusion at C5-C7 with plate and screw fixation and well incorporated interbody grafts. Hardware is intact. There is no acute fracture identified. Vertebral body heights are maintained. Soft tissues and spinal canal: No prevertebral fluid or swelling. No visible canal hematoma. Disc levels: Multilevel degenerative changes are present including facet and  uncovertebral hypertrophy. Bridging bone endplate osteophytes are present at the operative levels. Upper chest: No apical lung mass. Other: None. IMPRESSION: No acute cervical spine fracture. Electronically Signed   By: Macy Mis M.D.   On: 05/05/2019 15:45   CT ANKLE LEFT W CONTRAST  Result Date: 04/24/2019 CLINICAL DATA:  Chronic osteomyelitis of the left ankle. Chronic soft tissue wound. Charcot foot. Infected ingrown toenail EXAM: CT OF THE LEFT ANKLE WITH CONTRAST; CT OF THE LEFT FOOT WITHOUT CONTRAST TECHNIQUE: Multidetector CT imaging of the left foot was performed according to the standard protocol. Multiplanar CT image reconstructions were also generated. Multidetector CT imaging of the left ankle was performed following the standard protocol during bolus administration of intravenous contrast. CONTRAST:  75 cc Omnipaque COMPARISON:  Radiographs dated 04/11/2019 and MRI dated 12/05/2018 FINDINGS: Bones/Joint/Cartilage The fourth and fifth rays have been amputated. Has been a partial resection of the distal cuboid. There are chronic arthritic changes of the ankle joint and at the talonavicular joint and at the subtalar joint. There is an ankle effusion. There is a chronic varus deformity  of the foot. No discrete osteomyelitis. Prominent peroneal tubercle of the lateral aspect of the calcaneus. Muscles and Tendons There is chronic hypertrophy of the Achilles tendon with a Haglund deformity. Soft tissues There is marked soft tissue thickening at the posterolateral aspect of the foot adjacent to the calcaneocuboid joint. There is a 12 mm area of vague lucency within this soft tissue which could represent a small abscess. this is best seen on images 61 and 62 of series 18. IMPRESSION: 1. No evidence of active osteomyelitis. 2. Chronic arthritic changes of the ankle joint with a joint effusion. 3. a marked soft tissue thickening at the plantar lateral aspect of the foot at the calcaneocuboid  articulation with a possible 12 mm abscess in the soft tissues. Electronically Signed   By: Lorriane Shire M.D.   On: 04/24/2019 17:20   CT FOOT LEFT W CONTRAST  Result Date: 04/24/2019 CLINICAL DATA:  Chronic osteomyelitis of the left ankle. Chronic soft tissue wound. Charcot foot. Infected ingrown toenail EXAM: CT OF THE LEFT ANKLE WITH CONTRAST; CT OF THE LEFT FOOT WITHOUT CONTRAST TECHNIQUE: Multidetector CT imaging of the left foot was performed according to the standard protocol. Multiplanar CT image reconstructions were also generated. Multidetector CT imaging of the left ankle was performed following the standard protocol during bolus administration of intravenous contrast. CONTRAST:  75 cc Omnipaque COMPARISON:  Radiographs dated 04/11/2019 and MRI dated 12/05/2018 FINDINGS: Bones/Joint/Cartilage The fourth and fifth rays have been amputated. Has been a partial resection of the distal cuboid. There are chronic arthritic changes of the ankle joint and at the talonavicular joint and at the subtalar joint. There is an ankle effusion. There is a chronic varus deformity of the foot. No discrete osteomyelitis. Prominent peroneal tubercle of the lateral aspect of the calcaneus. Muscles and Tendons There is chronic hypertrophy of the Achilles tendon with a Haglund deformity. Soft tissues There is marked soft tissue thickening at the posterolateral aspect of the foot adjacent to the calcaneocuboid joint. There is a 12 mm area of vague lucency within this soft tissue which could represent a small abscess. this is best seen on images 61 and 62 of series 18. IMPRESSION: 1. No evidence of active osteomyelitis. 2. Chronic arthritic changes of the ankle joint with a joint effusion. 3. a marked soft tissue thickening at the plantar lateral aspect of the foot at the calcaneocuboid articulation with a possible 12 mm abscess in the soft tissues. Electronically Signed   By: Lorriane Shire M.D.   On: 04/24/2019 17:20     Patient benefited maximally from their hospital stay and there were no complications.     Disposition: Discharge disposition: 03-Skilled Nursing Facility      Discharge Instructions    Call MD / Call 911   Complete by: As directed    If you experience chest pain or shortness of breath, CALL 911 and be transported to the hospital emergency room.  If you develope a fever above 101 F, pus (white drainage) or increased drainage or redness at the wound, or calf pain, call your surgeon's office.   Constipation Prevention   Complete by: As directed    Drink plenty of fluids.  Prune juice may be helpful.  You may use a stool softener, such as Colace (over the counter) 100 mg twice a day.  Use MiraLax (over the counter) for constipation as needed.   Diet - low sodium heart healthy   Complete by: As directed    Discharge  instructions   Complete by: As directed    Do not remove Vac. Keep dressing Dry. Follow up Dr. Sharol Given 1 week   Increase activity slowly as tolerated   Complete by: As directed    Neg Press Wound Therapy / Incisional   Complete by: As directed    Show patient how to attach prevena pump   Neg Press Wound Therapy / Incisional   Complete by: As directed    Show Patient how to attach [place]    Newt Minion, MD In 1 week.   Specialty: Orthopedic Surgery Contact information: [address] [place] [address] [phone]            Signed: Bevely Palmer  05/21/2019, 9:06 AM

## 2019-05-21 NOTE — Plan of Care (Signed)
  Problem: Education: Goal: Knowledge of General Education information will improve Description: Including pain rating scale, medication(s)/side effects and non-pharmacologic comfort measures Outcome: Progressing   Problem: Safety: Goal: Ability to remain free from injury will improve Outcome: Progressing   

## 2019-05-21 NOTE — Plan of Care (Addendum)
Pt discharging to [facility]. Called report to Wallie Renshaw, RN at [phone]. Discharge instructions explained to pt and pt verbalized understanding. Packed all personal belongings. No further questions or concerns voiced. Awaiting transportation.   Problem: Education: Goal: Knowledge of General Education information will improve Description: Including pain rating scale, medication(s)/side effects and non-pharmacologic comfort measures Outcome: Completed/Met   Problem: Health Behavior/Discharge Planning: Goal: Ability to manage health-related needs will improve Outcome: Completed/Met   Problem: Clinical Measurements: Goal: Ability to maintain clinical measurements within normal limits will improve Outcome: Completed/Met Goal: Will remain free from infection Outcome: Completed/Met Goal: Diagnostic test results will improve Outcome: Completed/Met Goal: Respiratory complications will improve Outcome: Completed/Met Goal: Cardiovascular complication will be avoided Outcome: Completed/Met   Problem: Activity: Goal: Risk for activity intolerance will decrease Outcome: Completed/Met   Problem: Nutrition: Goal: Adequate nutrition will be maintained Outcome: Completed/Met   Problem: Coping: Goal: Level of anxiety will decrease Outcome: Completed/Met   Problem: Elimination: Goal: Will not experience complications related to bowel motility Outcome: Completed/Met Goal: Will not experience complications related to urinary retention Outcome: Completed/Met   Problem: Pain Managment: Goal: General experience of comfort will improve Outcome: Completed/Met   Problem: Safety: Goal: Ability to remain free from injury will improve Outcome: Completed/Met   Problem: Skin Integrity: Goal: Risk for impaired skin integrity will decrease Outcome: Completed/Met

## 2019-05-21 NOTE — Progress Notes (Signed)
VSS doing well this morning. No new drainage in [facility]  Discharge to SNF

## 2019-05-21 NOTE — TOC Transition Note (Signed)
Transition of Care [facility]) - CM/SW Discharge Note   Patient Details  Name: KHAMANI GOLUBSKI MRN: [id number] Date of Birth: Sep 23, 1955  [facility] of [facility]) CM/SW Contact:  Atilano Median, LCSW Phone Number: 05/21/2019, 9:38 AM   Clinical Narrative:    Discharged to SNF. Referral coordinated with Trey Sailors [phone]. Patient aware and agreeable to this plan. Number to call report [phone] given to unit RN Sophia. No other needs at this time. Case closed to this CSW.    Final next level of care: Skilled Nursing Facility Barriers to Discharge: Barriers Resolved   Patient Goals and CMS Choice Patient states their goals for this hospitalization and ongoing recovery are:: go to rehab so I can go home CMS Medicare.gov Compare Post Acute Care list provided to:: Patient Choice offered to / list presented to : Patient  Discharge Placement   Existing PASRR number confirmed : 05/20/19          Patient chooses bed at: [facility] Patient to be transferred to facility by: [place] Name of family member notified: patient Patient and family notified of of transfer: 05/21/19  Discharge Plan and Services                                     Social Determinants of Health (SDOH) Interventions     Readmission Risk Interventions No flowsheet data found.

## 2019-05-22 ENCOUNTER — Telehealth: Payer: Self-pay | Admitting: Orthopedic Surgery

## 2019-05-22 NOTE — Telephone Encounter (Signed)
I called and sw pt and she is very upset that [facility] has 80% covid pt's and feels that the nursing staff is over worked and short staffed and that she is not getting the care that she needs. She feels like if she can have therapy work with her on getting to a bedside commode that she can then take care of her toilet  needs and that would make her feel better. I advised that we could speak with therapy and she said that she will discuss with them once they come for eval. She wanted to know if she could change facilites and I advised that insurance would most likely not cover the change and could be an out of pocket expense. She would require a 23 hour observation at the hospital and then upon d/c could go to a facility that had a bed open.  No guarantee. Pt voiced understanding and said that if need be she does have friends that offered for her to come and stay with them while she recovers

## 2019-05-22 NOTE — Telephone Encounter (Signed)
Patient is requesting a call back from Dr. Sharol Given nurse. Patient phone number is [phone].

## 2019-05-28 ENCOUNTER — Telehealth: Payer: Self-pay | Admitting: Orthopedic Surgery

## 2019-05-28 ENCOUNTER — Encounter: Payer: Self-pay | Admitting: Physician Assistant

## 2019-05-28 ENCOUNTER — Ambulatory Visit (INDEPENDENT_AMBULATORY_CARE_PROVIDER_SITE_OTHER): Payer: Medicare Other | Admitting: Physician Assistant

## 2019-05-28 ENCOUNTER — Other Ambulatory Visit: Payer: Self-pay

## 2019-05-28 VITALS — Ht 60.0 in | Wt >= 6400 oz

## 2019-05-28 DIAGNOSIS — E11610 Type 2 diabetes mellitus with diabetic neuropathic arthropathy: Secondary | ICD-10-CM

## 2019-05-28 NOTE — Telephone Encounter (Signed)
I called and sw the pt to advise that the social worker from the SNF would coordinate that upon discharge from the facility. That it would depend on who participates whith her insurance and who would have the availability to see the pt. Voiced understanding and will speak with the facility. To call with any other questions.

## 2019-05-28 NOTE — Progress Notes (Signed)
Office Visit Note   Patient: ABBEE Romero           Date of Birth: Sep 15, 1955           MRN: [id number] Visit Date: 05/28/2019              Requested by: [name], [address] [place],  [place] [address] PCP: [name], FNP  Chief Complaint  Patient presents with  . Left Leg - Routine Post Op    05/16/19 left BKA       HPI: The patient presents today 12 days status post left below-knee amputation she is doing well  Assessment & Plan: Visit Diagnoses: No diagnosis found.  Plan: She will follow up in 2 weeks at this point she can wash her leg daily and place the shrinker over her leg  Follow-Up Instructions: No follow-ups on file.   Ortho Exam  Patient is alert, oriented, no adenopathy, well-dressed, normal affect, normal respiratory effort. Left amputation stump incision is healing quite well well approximated and healthy wound edges swelling is well controlled no cellulitis Imaging: No results found.   Labs: Lab Results  Component Value Date   HGBA1C 7.3 (H) 05/16/2019   HGBA1C 7.9 09/17/2018   HGBA1C 7.0 05/08/2018   ESRSEDRATE 60 (H) 12/05/2018   ESRSEDRATE 41 (H) 12/04/2018   ESRSEDRATE 30 07/12/2018   CRP 22.0 (H) 12/05/2018   CRP 20 (H) 12/04/2018   CRP 6 07/12/2018   LABURIC 8.2 (H) 11/30/2015   REPTSTATUS 12/12/2018 FINAL 12/06/2018   GRAMSTAIN  12/06/2018    RARE WBC PRESENT, PREDOMINANTLY PMN NO ORGANISMS SEEN    CULT  12/06/2018    RARE METHICILLIN RESISTANT STAPHYLOCOCCUS AUREUS WITH NORMAL SKIN FLORA NO ANAEROBES ISOLATED Performed at [facility], [place] [address], [place], [place] [address]    LABORGA METHICILLIN RESISTANT STAPHYLOCOCCUS AUREUS 12/06/2018     Lab Results  Component Value Date   ALBUMIN 3.7 12/04/2018   ALBUMIN 4.0 11/27/2018   ALBUMIN 3.8 03/11/2018   PREALBUMIN 21.5 03/12/2018   LABURIC 8.2 (H) 11/30/2015    No results found for: MG Lab Results  Component Value Date   VD25OH 11 (L) 09/20/2016    Lab Results  Component Value Date   PREALBUMIN 21.5 03/12/2018   CBC EXTENDED Latest Ref Rng & Units 05/16/2019 12/11/2018 12/11/2018  WBC 4.0 - 10.5 K/uL 8.4 10.6(H) 11.6(H)  RBC 3.87 - 5.11 MIL/uL 4.35 3.73(L) 3.76(L)  HGB 12.0 - 15.0 g/dL 11.2(L) 9.8(L) 9.8(L)  HCT 36.0 - 46.0 % 36.1 30.8(L) 31.3(L)  PLT 150 - 400 K/uL 310 503(H) 482(H)  NEUTROABS 1.7 - 7.7 K/uL - - -  LYMPHSABS 0.7 - 4.0 K/uL - - -     Body mass index is 80.07 kg/m.  Orders:  No orders of the defined types were placed in this encounter.  No orders of the defined types were placed in this encounter.    Procedures: No procedures performed  Clinical Data: No additional findings.  ROS:  All other systems negative, except as noted in the HPI. Review of Systems  Objective: Vital Signs: Ht 5' (1.524 m)   Wt (!) 410 lb (186 kg)   LMP  (LMP Unknown)   BMI 80.07 kg/m   Specialty Comments:  No specialty comments available.  PMFS History: Patient Active Problem List   Diagnosis Date Noted  . Gangrene of left foot ([place]) 05/16/2019  . Subacute osteomyelitis of left foot (  [place])   . Charcot's joint of foot, left   . Post-operative state   . PAD (peripheral artery disease) ([place]) 12/05/2018  . Osteomyelitis of ankle or foot, acute, left ([place]) 12/05/2018  . MRSA bacteremia 12/05/2018  . Sepsis without acute organ dysfunction ([place])   . Charcot's joint of foot due to diabetes ([place]) 09/18/2018  . BMI 36.0-36.9,adult   . Morbid obesity ([place]) 11/19/2017  . B12 deficiency 11/01/2017  . Diabetic foot ulcer ([place]) 12/14/2016  . Diabetic polyneuropathy associated with type 2 diabetes mellitus ([place]) 12/13/2015  . Lumbar stenosis with neurogenic claudication 09/02/2015  . Chronic constipation 07/13/2015  . OSA (obstructive sleep apnea) 06/25/2015  . 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/23/2015  . Hypertriglyceridemia 04/23/2015  . Asthma, mild intermittent 02/01/2015    . Type 2 diabetes mellitus with diabetic nephropathy ([place]) 12/31/2014  . Gastroesophageal reflux disease with esophagitis 12/31/2014  . Dysphagia 12/31/2014  . Bilateral carpal tunnel syndrome 12/01/2014  . Diverticulosis of colon (without mention of hemorrhage) 11/30/2012  . Essential hypertension, benign 11/28/2012   Past Medical History:  Diagnosis Date  . Anemia   . Arthritis   . Asthma   . Colon polyps    adenomatous  . Diabetes mellitus without complication ([place])   . Diabetic infection of left foot ([place]) 12/2018  . Diverticulosis of colon   . Esophagitis   . GERD (gastroesophageal reflux disease)   . Hemorrhoid    internal  . Hyperlipemia   . Hypertension   . IBS (irritable bowel syndrome)    no current prob - diet controlled  . Myalgia due to statin 11/19/2017  . Neuropathy   . Neuropathy of both feet   . Neuropathy of hand   . Pneumonia    x 4  . PONV (postoperative nausea and vomiting)    on some surgeries but not all procedures  . Skin ulcer of right ankle, limited to breakdown of skin ([place]) 12/31/2016   resolved per patient 05/14/19  . Sleep apnea    has had in the past lost 50 pounds and do longer uses cpap  . Statin intolerance 04/23/2015  . Stroke [facility]) 2009   mini stroke per patient  . Stroke-like episode 2009   TIA - mini stroke per patient  . 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 ([place]) 07/13/2015  . Uncontrolled type 2 diabetes mellitus with hyperglycemia ([place]) 11/01/2017    Family History  Problem Relation Age of Onset  . Lung cancer Father   . Hypertension Father   . Arthritis Father   . Other Mother        hardening of the arteries/renal cell carcenoma  . Hypertension Mother   . Stroke Mother   . Kidney cancer Mother   . Arthritis Brother   . Rheum arthritis Maternal Uncle   . Bladder Cancer Neg Hx     Past Surgical History:  Procedure Laterality Date  . ABDOMINAL HYSTERECTOMY    . AMPUTATION Left 12/06/2018   Procedure:  PARTIAL AMPUTATION LEFT FOOT;  Surgeon: Edrick Kins, DPM;  Location: [place];  Service: Podiatry;  Laterality: Left;  . AMPUTATION Left 05/16/2019   Procedure: LEFT BELOW KNEE AMPUTATION;  Surgeon: Newt Minion, MD;  Location: [place];  Service: Orthopedics;  Laterality: Left;  . BACK SURGERY  09/02/2015  . BONE BIOPSY Left 12/06/2018   Procedure: Bone Biopsy;  Surgeon: Edrick Kins, DPM;  Location: [place];  Service: Podiatry;  Laterality: Left;  . CERVICAL  FUSION    . CESAREAN SECTION  1986  . COLONOSCOPY N/A 11/30/2012   Procedure: COLONOSCOPY;  Surgeon: Irene Shipper, MD;  Location: [facility] ENDOSCOPY;  Service: Endoscopy;  Laterality: N/A;  . COLONOSCOPY WITH PROPOFOL N/A 01/03/2016   Procedure: COLONOSCOPY WITH PROPOFOL;  Surgeon: Manya Silvas, MD;  Location: [facility] ENDOSCOPY;  Service: Endoscopy;  Laterality: N/A;  . ESOPHAGOGASTRODUODENOSCOPY (EGD) WITH PROPOFOL N/A 01/14/2015   Procedure: ESOPHAGOGASTRODUODENOSCOPY (EGD) WITH PROPOFOL;  Surgeon: Manya Silvas, MD;  Location: [facility] ENDOSCOPY;  Service: Endoscopy;  Laterality: N/A;  . IRRIGATION AND DEBRIDEMENT FOOT Left 03/13/2018   Procedure: IRRIGATION AND DEBRIDEMENT FOOT WITH BONE BIOPSY WITH MISONIX DEBRIDER;  Surgeon: Evelina Bucy, DPM;  Location: [place];  Service: Podiatry;  Laterality: Left;  . IRRIGATION AND DEBRIDEMENT FOOT Left 12/06/2018   Procedure: Irrigation And Debridement Foot;  Surgeon: Edrick Kins, DPM;  Location: [place];  Service: Podiatry;  Laterality: Left;  . LUMBAR WOUND DEBRIDEMENT N/A 10/01/2015   Procedure: LUMBAR WOUND DEBRIDEMENT;  Surgeon: Ashok Pall, MD;  Location: [place] [facility]S;  Service: Neurosurgery;  Laterality: N/A;  LUMBAR WOUND DEBRIDEMENT  . NASAL SINUS SURGERY    . SAVORY DILATION N/A 01/14/2015   Procedure: SAVORY DILATION;  Surgeon: Manya Silvas, MD;  Location: [facility]COPY;  Service: Endoscopy;  Laterality: N/A;  . TONSILLECTOMY    . WISDOM TOOTH EXTRACTION     Social History    Occupational History  . Occupation: histology Engineer, production: [employer]  Tobacco Use  . Smoking status: Never Smoker  . Smokeless tobacco: Never Used  Substance and Sexual Activity  . Alcohol use: Never  . Drug use: No  . Sexual activity: Not Currently    Comment: Hysterectomy

## 2019-05-28 NOTE — Telephone Encounter (Signed)
Patient called. She would like to know who would be doing her home PT. Her call back number is [phone]

## 2019-06-02 ENCOUNTER — Other Ambulatory Visit: Payer: Self-pay

## 2019-06-02 ENCOUNTER — Ambulatory Visit (INDEPENDENT_AMBULATORY_CARE_PROVIDER_SITE_OTHER): Payer: Medicare Other | Admitting: Podiatry

## 2019-06-02 DIAGNOSIS — M779 Enthesopathy, unspecified: Secondary | ICD-10-CM | POA: Diagnosis not present

## 2019-06-02 DIAGNOSIS — M79674 Pain in right toe(s): Secondary | ICD-10-CM

## 2019-06-02 DIAGNOSIS — E1149 Type 2 diabetes mellitus with other diabetic neurological complication: Secondary | ICD-10-CM

## 2019-06-02 DIAGNOSIS — M7741 Metatarsalgia, right foot: Secondary | ICD-10-CM | POA: Diagnosis not present

## 2019-06-02 DIAGNOSIS — B351 Tinea unguium: Secondary | ICD-10-CM | POA: Diagnosis not present

## 2019-06-02 NOTE — Patient Instructions (Signed)
Patient was seen today for right foot pain and had toenails trimmed

## 2019-06-05 NOTE — Progress Notes (Signed)
Subjective: 63 year old female presents the office today to have her nails trimmed on her right foot as well as for right foot pain that she felt while doing physical therapy.  She recently underwent left below-knee amputation.  While doing therapy she started noticed pain in the ball of her foot.  She did stop walking.  Denies any falls.  No swelling.  She has no other concerns. Denies any systemic complaints such as fevers, chills, nausea, vomiting. No acute changes since last appointment, and no other complaints at this time.   Objective: AAO x3, NAD DP/PT pulses palpable bilaterally, CRT less than 3 seconds Status post left below-knee amputation On the right foot there is prominent metatarsal heads plantarly with atrophy of the fat pad.  This is where she had discomfort but there is no significant tenderness today.  There is no pain the dorsal MPJs or metatarsals.  There is no edema, erythema. Nails are hypertrophic, dystrophic with yellow discoloration of the nails 1 through 5 on the right foot causing discomfort with shoes. No pain with calf compression, swelling, warmth, erythema  Assessment: Right foot prominent metatarsal heads, capsulitis; symptomatic onychomycosis  Plan: -All treatment options discussed with the patient including all alternatives, risks, complications.  -Dispensed gel metatarsal pads for the right foot.  Also had Liliane Channel evaluate her and he added to diabetic insert inside the shoe to help offload the metatarsal heads.  Discussed importance of looking at her foot daily to ensure no skin breakdown.  Discussed wearing an ankle brace on the right side while doing therapy to help support her ankle. -Debrided the nails x5 Complications or bleeding -Patient encouraged to call the office with any questions, concerns, change in symptoms.   Trula Slade DPM

## 2019-06-09 ENCOUNTER — Telehealth: Payer: Self-pay | Admitting: Orthopedic Surgery

## 2019-06-09 ENCOUNTER — Telehealth: Payer: Self-pay | Admitting: Podiatry

## 2019-06-09 NOTE — Telephone Encounter (Signed)
Pt called wanted tpo speak to Dr.Wagoner about last visit 06/02/2019

## 2019-06-09 NOTE — Telephone Encounter (Signed)
Ronny Bacon, patient's daughter-in-law, called concerning Olivia Romero.  She is staying at [facility] and is positive for COVID 19.  She has a post op BKA follow up appointment on 06/11/19.  Since she is on the [place] [address] floor at her facility, Ronny Bacon is questioning how she will be examined since she cannot come in to our office for her appointment.  She would like for Dr. Jess Barters assistant to call [facility] and see what their plans are for her mother's treatment.

## 2019-06-10 ENCOUNTER — Telehealth: Payer: Self-pay | Admitting: Radiology

## 2019-06-10 NOTE — Telephone Encounter (Signed)
Ronny Bacon, Daughter-in-law, called again in regards to patients.   Patient is COVID positive at [facility], is suppose to come in tomorrow for appointment for staple removal however cannot leave facility.  States wound care specialists removed sleeve on Monday, with redness and drainage, was told it did not look infected more like staples trying to come out. States no has checked incision since. Also states has not been cleaned since last Thursday.  She has had trouble getting in touch with nurses at [facility] concerning care and what is going on.

## 2019-06-10 NOTE — Telephone Encounter (Signed)
Duplicate message I will keep the one I am working on and sign off on this.

## 2019-06-10 NOTE — Telephone Encounter (Signed)
I spoke with pt's daughter in law and let her know that I will call the facility and let them know that she was due for staple removal and that they can proceed with this. At last visit the incision was well approximated and healing well. They should wash the area with soap and water and apply shrinker daily. I tried to call [facility] was advised that the pt is a resident of [facility] [phone]. The phone rang without answer. I held for several minutes and was not able to leave a message. I will hold this and address tomorrow.

## 2019-06-11 ENCOUNTER — Ambulatory Visit: Payer: Medicare Other | Admitting: Physician Assistant

## 2019-06-11 NOTE — Telephone Encounter (Signed)
I called and sw Olivia Romero at [facility] # [phone] advised that staples can be removed today to wash the limb with soap and water and pt to wear the shrinker 24/7. If there are concerns about the incision at all to call me and they can email pictures to me to relay to Dr. Sharol Given. I also called the pt's daughter in law to advise of the contact and she will also call with any additional questions.

## 2019-06-12 ENCOUNTER — Encounter: Payer: Self-pay | Admitting: Podiatry

## 2019-06-13 ENCOUNTER — Ambulatory Visit: Payer: Medicare Other | Admitting: Podiatry

## 2019-06-13 ENCOUNTER — Telehealth: Payer: Self-pay | Admitting: Orthopedic Surgery

## 2019-06-13 NOTE — Telephone Encounter (Signed)
Patient called and stated she tested positive for Covid and wanted to know if the nurse at [facility] can remove staples.  Please call patient @ [phone]

## 2019-06-13 NOTE — Telephone Encounter (Signed)
I called and lm on vm for pt to advise that I had spoken with nursing staff Wednesday to give verbal order for this spoke with Olivia Romero to advise. They should be doing this for her today. To call with any questions.

## 2019-06-13 NOTE — Telephone Encounter (Signed)
I called nurse back and she had some concerns about the incision and wanted to share a picture. I gave her Dr. Jess Barters cell number to share picture and then he can advise if the staples are still ok to remove today.

## 2019-06-13 NOTE — Telephone Encounter (Signed)
Olivia Romero with the [facility] called in wanting a call back from someone regarding the pt's wound care and some things she's needing for the pt's care.  Please give her a call, she said just ask for her.   [phone]

## 2019-06-18 ENCOUNTER — Ambulatory Visit: Payer: Medicare Other | Admitting: Physician Assistant

## 2019-06-20 ENCOUNTER — Other Ambulatory Visit: Payer: Self-pay

## 2019-06-20 ENCOUNTER — Encounter: Payer: Self-pay | Admitting: Physician Assistant

## 2019-06-20 ENCOUNTER — Ambulatory Visit (INDEPENDENT_AMBULATORY_CARE_PROVIDER_SITE_OTHER): Payer: Medicare Other | Admitting: Physician Assistant

## 2019-06-20 VITALS — Ht 60.0 in | Wt >= 6400 oz

## 2019-06-20 DIAGNOSIS — M86172 Other acute osteomyelitis, left ankle and foot: Secondary | ICD-10-CM

## 2019-06-20 NOTE — Progress Notes (Signed)
Office Visit Note   Patient: Olivia Romero           Date of Birth: 07/03/1955           MRN: [id number] Visit Date: 06/20/2019              Requested by: [name], [address] [place],  [place] [address] PCP: [name], FNP  Chief Complaint  Patient presents with  . Left Leg - Routine Post Op    05/16/2019 LBKA      HPI: This is a pleasant woman who is 5 weeks status post below-knee amputation.  We have not seen her in a while because while in a nursing home she contracted Covid 19 and was in quarantine.  The nursing home removed most of the surgical staples and placed Steri-Strips.  She is being discharged in a few days  Assessment & Plan: Visit Diagnoses: No diagnosis found.  Plan: She will follow up in 1 week for removal of the final surgical staple  Follow-Up Instructions: No follow-ups on file.   Ortho Exam  Patient is alert, oriented, no adenopathy, well-dressed, normal affect, normal respiratory effort. Status post amputation the wound is healing well Steri-Strips were removed.  There is good control of the swelling no necrotic skin edges on the medial edge there is one area where I left a staple in place as the wound is still in the final stages of healing.  She also had an area of slight drainage which was explored and I believed to be a suture abscess.  I tried to remove most of the Vicryl that was visible.  There is no surrounding cellulitis or erythema  Imaging: No results found. No images are attached to the encounter.  Labs: Lab Results  Component Value Date   HGBA1C 7.3 (H) 05/16/2019   HGBA1C 7.9 09/17/2018   HGBA1C 7.0 05/08/2018   ESRSEDRATE 60 (H) 12/05/2018   ESRSEDRATE 41 (H) 12/04/2018   ESRSEDRATE 30 07/12/2018   CRP 22.0 (H) 12/05/2018   CRP 20 (H) 12/04/2018   CRP 6 07/12/2018   LABURIC 8.2 (H) 11/30/2015   REPTSTATUS 12/12/2018 FINAL 12/06/2018   GRAMSTAIN  12/06/2018    RARE WBC PRESENT, PREDOMINANTLY  PMN NO ORGANISMS SEEN    CULT  12/06/2018    RARE METHICILLIN RESISTANT STAPHYLOCOCCUS AUREUS WITH NORMAL SKIN FLORA NO ANAEROBES ISOLATED Performed at [facility], [place] [address], [place], [place] [address]    LABORGA METHICILLIN RESISTANT STAPHYLOCOCCUS AUREUS 12/06/2018     Lab Results  Component Value Date   ALBUMIN 3.7 12/04/2018   ALBUMIN 4.0 11/27/2018   ALBUMIN 3.8 03/11/2018   PREALBUMIN 21.5 03/12/2018   LABURIC 8.2 (H) 11/30/2015    No results found for: MG Lab Results  Component Value Date   VD25OH 11 (L) 09/20/2016    Lab Results  Component Value Date   PREALBUMIN 21.5 03/12/2018   CBC EXTENDED Latest Ref Rng & Units 05/16/2019 12/11/2018 12/11/2018  WBC 4.0 - 10.5 K/uL 8.4 10.6(H) 11.6(H)  RBC 3.87 - 5.11 MIL/uL 4.35 3.73(L) 3.76(L)  HGB 12.0 - 15.0 g/dL 11.2(L) 9.8(L) 9.8(L)  HCT 36.0 - 46.0 % 36.1 30.8(L) 31.3(L)  PLT 150 - 400 K/uL 310 503(H) 482(H)  NEUTROABS 1.7 - 7.7 K/uL - - -  LYMPHSABS 0.7 - 4.0 K/uL - - -     Body mass index is 80.07 kg/m.  Orders:  No orders of the defined types  were placed in this encounter.  No orders of the defined types were placed in this encounter.    Procedures: No procedures performed  Clinical Data: No additional findings.  ROS:  All other systems negative, except as noted in the HPI. Review of Systems  Objective: Vital Signs: Ht 5' (1.524 m)   Wt (!) 410 lb (186 kg)   LMP  (LMP Unknown)   BMI 80.07 kg/m   Specialty Comments:  No specialty comments available.  PMFS History: Patient Active Problem List   Diagnosis Date Noted  . Gangrene of left foot ([place]) 05/16/2019  . Subacute osteomyelitis of left foot ([place])   . Charcot's joint of foot, left   . Post-operative state   . PAD (peripheral artery disease) ([place]) 12/05/2018  . Osteomyelitis of ankle or foot, acute, left ([place]) 12/05/2018  . MRSA bacteremia 12/05/2018  . Sepsis without acute organ dysfunction ([place])   . Charcot's joint of  foot due to diabetes ([place]) 09/18/2018  . BMI 36.0-36.9,adult   . Morbid obesity ([place]) 11/19/2017  . B12 deficiency 11/01/2017  . Diabetic foot ulcer ([place]) 12/14/2016  . Diabetic polyneuropathy associated with type 2 diabetes mellitus ([place]) 12/13/2015  . Lumbar stenosis with neurogenic claudication 09/02/2015  . Chronic constipation 07/13/2015  . OSA (obstructive sleep apnea) 06/25/2015  . 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/23/2015  . Hypertriglyceridemia 04/23/2015  . Asthma, mild intermittent 02/01/2015  . Type 2 diabetes mellitus with diabetic nephropathy ([place]) 12/31/2014  . Gastroesophageal reflux disease with esophagitis 12/31/2014  . Dysphagia 12/31/2014  . Bilateral carpal tunnel syndrome 12/01/2014  . Diverticulosis of colon (without mention of hemorrhage) 11/30/2012  . Essential hypertension, benign 11/28/2012   Past Medical History:  Diagnosis Date  . Anemia   . Arthritis   . Asthma   . Colon polyps    adenomatous  . Diabetes mellitus without complication ([place])   . Diabetic infection of left foot ([place]) 12/2018  . Diverticulosis of colon   . Esophagitis   . GERD (gastroesophageal reflux disease)   . Hemorrhoid    internal  . Hyperlipemia   . Hypertension   . IBS (irritable bowel syndrome)    no current prob - diet controlled  . Myalgia due to statin 11/19/2017  . Neuropathy   . Neuropathy of both feet   . Neuropathy of hand   . Pneumonia    x 4  . PONV (postoperative nausea and vomiting)    on some surgeries but not all procedures  . Skin ulcer of right ankle, limited to breakdown of skin ([place]) 12/31/2016   resolved per patient 05/14/19  . Sleep apnea    has had in the past lost 50 pounds and do longer uses cpap  . Statin intolerance 04/23/2015  . Stroke [facility]) 2009   mini stroke per patient  . Stroke-like episode 2009   TIA - mini stroke per patient  . 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 ([place]) 07/13/2015  . Uncontrolled  type 2 diabetes mellitus with hyperglycemia ([place]) 11/01/2017    Family History  Problem Relation Age of Onset  . Lung cancer Father   . Hypertension Father   . Arthritis Father   . Other Mother        hardening of the arteries/renal cell carcenoma  . Hypertension Mother   . Stroke Mother   . Kidney cancer Mother   . Arthritis Brother   . Rheum arthritis Maternal Uncle   . Bladder Cancer Neg Hx  Past Surgical History:  Procedure Laterality Date  . ABDOMINAL HYSTERECTOMY    . AMPUTATION Left 12/06/2018   Procedure: PARTIAL AMPUTATION LEFT FOOT;  Surgeon: Edrick Kins, DPM;  Location: [place];  Service: Podiatry;  Laterality: Left;  . AMPUTATION Left 05/16/2019   Procedure: LEFT BELOW KNEE AMPUTATION;  Surgeon: Newt Minion, MD;  Location: [place];  Service: Orthopedics;  Laterality: Left;  . BACK SURGERY  09/02/2015  . BONE BIOPSY Left 12/06/2018   Procedure: Bone Biopsy;  Surgeon: Edrick Kins, DPM;  Location: [place];  Service: Podiatry;  Laterality: Left;  . CERVICAL FUSION    . CESAREAN SECTION  1986  . COLONOSCOPY N/A 11/30/2012   Procedure: COLONOSCOPY;  Surgeon: Irene Shipper, MD;  [facility] ENDOSCOPY;  Service: Endoscopy;  Laterality: N/A;  . COLONOSCOPY WITH PROPOFOL N/A 01/03/2016   Procedure: COLONOSCOPY WITH PROPOFOL;  Surgeon: Manya Silvas, MD;  Location: [facility] ENDOSCOPY;  Service: Endoscopy;  Laterality: N/A;  . ESOPHAGOGASTRODUODENOSCOPY (EGD) WITH PROPOFOL N/A 01/14/2015   Procedure: ESOPHAGOGASTRODUODENOSCOPY (EGD) WITH PROPOFOL;  Surgeon: Manya Silvas, MD;  Location: [facility] ENDOSCOPY;  Service: Endoscopy;  Laterality: N/A;  . IRRIGATION AND DEBRIDEMENT FOOT Left 03/13/2018   Procedure: IRRIGATION AND DEBRIDEMENT FOOT WITH BONE BIOPSY WITH MISONIX DEBRIDER;  Surgeon: Evelina Bucy, DPM;  Location: [place];  Service: Podiatry;  Laterality: Left;  . IRRIGATION AND DEBRIDEMENT FOOT Left 12/06/2018   Procedure: Irrigation And Debridement Foot;  Surgeon: Edrick Kins,  DPM;  Location: [place];  Service: Podiatry;  Laterality: Left;  . LUMBAR WOUND DEBRIDEMENT N/A 10/01/2015   Procedure: LUMBAR WOUND DEBRIDEMENT;  Surgeon: Ashok Pall, MD;  Location: [place] NEURO ORS;  Service: Neurosurgery;  Laterality: N/A;  LUMBAR WOUND DEBRIDEMENT  . NASAL SINUS SURGERY    . SAVORY DILATION N/A 01/14/2015   Procedure: SAVORY DILATION;  Surgeon: Manya Silvas, MD;  Location: [facility] ENDOSCOPY;  Service: Endoscopy;  Laterality: N/A;  . TONSILLECTOMY    . WISDOM TOOTH EXTRACTION     Social History   Occupational History  . Occupation: histology Engineer, production: [employer]  Tobacco Use  . Smoking status: Never Smoker  . Smokeless tobacco: Never Used  Substance and Sexual Activity  . Alcohol use: Never  . Drug use: No  . Sexual activity: Not Currently    Comment: Hysterectomy

## 2019-06-21 ENCOUNTER — Encounter: Payer: Self-pay | Admitting: Orthopedic Surgery

## 2019-06-22 ENCOUNTER — Encounter: Payer: Self-pay | Admitting: Orthopedic Surgery

## 2019-06-23 ENCOUNTER — Encounter: Payer: Self-pay | Admitting: Orthopedic Surgery

## 2019-06-23 ENCOUNTER — Other Ambulatory Visit: Payer: Self-pay

## 2019-06-23 ENCOUNTER — Ambulatory Visit (INDEPENDENT_AMBULATORY_CARE_PROVIDER_SITE_OTHER): Payer: Medicare Other | Admitting: Orthopedic Surgery

## 2019-06-23 VITALS — Ht 60.0 in | Wt >= 6400 oz

## 2019-06-23 DIAGNOSIS — M86172 Other acute osteomyelitis, left ankle and foot: Secondary | ICD-10-CM

## 2019-06-23 NOTE — Progress Notes (Signed)
Office Visit Note   Patient: Olivia Romero           Date of Birth: 1955-07-16           MRN: [id number] Visit Date: 06/23/2019              Requested by: [name], [address] [place],  [place] [id number] PCP: [name], FNP  Chief Complaint  Patient presents with  . Left Leg - Routine Post Op    05/16/19 left BKA       HPI: This is a pleasant woman who is following up because of a small wound dehiscence.  She was 5 weeks status post below-knee amputation and most of her staples were removed at the nursing home and Steri-Strips were placed in a confluent pattern.  More of the staples were removed on Friday and she called over the weekend because the medial side of her wound had "opened up.  She was seen by a nurse who applied thin Steri-Strips and placed a covering over it  Assessment & Plan: Visit Diagnoses: No diagnosis found.  Plan: I would like for her to use her shrinker against the skin.  She will follow up on Friday for reevaluation  Follow-Up Instructions: No follow-ups on file.   Ortho Exam  Patient is alert, oriented, no adenopathy, well-dressed, normal affect, normal respiratory effort. Focused examination demonstrates minimal to no soft tissue swelling over the stump the incision is completely healed with the exception of a medial area where there remains one staple.  There is just a small dehiscence with fibrous tissue in between there is no cellulitis there is no purulent drainage.  It is dehisced approximately a 2 cm x 1 cm area  Imaging: No results found. No images are attached to the encounter.  Labs: Lab Results  Component Value Date   HGBA1C 7.3 (H) 05/16/2019   HGBA1C 7.9 09/17/2018   HGBA1C 7.0 05/08/2018   ESRSEDRATE 60 (H) 12/05/2018   ESRSEDRATE 41 (H) 12/04/2018   ESRSEDRATE 30 07/12/2018   CRP 22.0 (H) 12/05/2018   CRP 20 (H) 12/04/2018   CRP 6 07/12/2018   LABURIC 8.2 (H) 11/30/2015   REPTSTATUS 12/12/2018  FINAL 12/06/2018   GRAMSTAIN  12/06/2018    RARE WBC PRESENT, PREDOMINANTLY PMN NO ORGANISMS SEEN    CULT  12/06/2018    RARE METHICILLIN RESISTANT STAPHYLOCOCCUS AUREUS WITH NORMAL SKIN FLORA NO ANAEROBES ISOLATED Performed at [facility], [place] [address] [place], [place] [address]    LABORGA METHICILLIN RESISTANT STAPHYLOCOCCUS AUREUS 12/06/2018     Lab Results  Component Value Date   ALBUMIN 3.7 12/04/2018   ALBUMIN 4.0 11/27/2018   ALBUMIN 3.8 03/11/2018   PREALBUMIN 21.5 03/12/2018   LABURIC 8.2 (H) 11/30/2015    No results found for: MG Lab Results  Component Value Date   VD25OH 11 (L) 09/20/2016    Lab Results  Component Value Date   PREALBUMIN 21.5 03/12/2018   CBC EXTENDED Latest Ref Rng & Units 05/16/2019 12/11/2018 12/11/2018  WBC 4.0 - 10.5 K/uL 8.4 10.6(H) 11.6(H)  RBC 3.87 - 5.11 MIL/uL 4.35 3.73(L) 3.76(L)  HGB 12.0 - 15.0 g/dL 11.2(L) 9.8(L) 9.8(L)  HCT 36.0 - 46.0 % 36.1 30.8(L) 31.3(L)  PLT 150 - 400 K/uL 310 503(H) 482(H)  NEUTROABS 1.7 - 7.7 K/uL - - -  LYMPHSABS 0.7 - 4.0 K/uL - - -     Body mass index is 80.07 kg/m.  Orders:  No orders of the defined types were placed in this encounter.  No orders of the defined types were placed in this encounter.    Procedures: No procedures performed  Clinical Data: No additional findings.  ROS:  All other systems negative, except as noted in the HPI. Review of Systems  Objective: Vital Signs: Ht 5' (1.524 m)   Wt (!) 410 lb (186 kg)   LMP  (LMP Unknown)   BMI 80.07 kg/m   Specialty Comments:  No specialty comments available.  PMFS History: Patient Active Problem List   Diagnosis Date Noted  . Gangrene of left foot ([place]) 05/16/2019  . Subacute osteomyelitis of left foot ([place])   . Charcot's joint of foot, left   . Post-operative state   . PAD (peripheral artery disease) ([place]) 12/05/2018  . Osteomyelitis of ankle or foot, acute, left ([place]) 12/05/2018  . MRSA bacteremia  12/05/2018  . Sepsis without acute organ dysfunction ([place])   . Charcot's joint of foot due to diabetes ([place]) 09/18/2018  . BMI 36.0-36.9,adult   . Morbid obesity ([place]) 11/19/2017  . B12 deficiency 11/01/2017  . Diabetic foot ulcer ([place]) 12/14/2016  . Diabetic polyneuropathy associated with type 2 diabetes mellitus ([place]) 12/13/2015  . Lumbar stenosis with neurogenic claudication 09/02/2015  . Chronic constipation 07/13/2015  . OSA (obstructive sleep apnea) 06/25/2015  . 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/23/2015  . Hypertriglyceridemia 04/23/2015  . Asthma, mild intermittent 02/01/2015  . Type 2 diabetes mellitus with diabetic nephropathy ([place]) 12/31/2014  . Gastroesophageal reflux disease with esophagitis 12/31/2014  . Dysphagia 12/31/2014  . Bilateral carpal tunnel syndrome 12/01/2014  . Diverticulosis of colon (without mention of hemorrhage) 11/30/2012  . Essential hypertension, benign 11/28/2012   Past Medical History:  Diagnosis Date  . Anemia   . Arthritis   . Asthma   . Colon polyps    adenomatous  . Diabetes mellitus without complication ([place])   . Diabetic infection of left foot ([place]) 12/2018  . Diverticulosis of colon   . Esophagitis   . GERD (gastroesophageal reflux disease)   . Hemorrhoid    internal  . Hyperlipemia   . Hypertension   . IBS (irritable bowel syndrome)    no current prob - diet controlled  . Myalgia due to statin 11/19/2017  . Neuropathy   . Neuropathy of both feet   . Neuropathy of hand   . Pneumonia    x 4  . PONV (postoperative nausea and vomiting)    on some surgeries but not all procedures  . Skin ulcer of right ankle, limited to breakdown of skin ([place]) 12/31/2016   resolved per patient 05/14/19  . Sleep apnea    has had in the past lost 50 pounds and do longer uses cpap  . Statin intolerance 04/23/2015  . Stroke [facility]) 2009   mini stroke per patient  . Stroke-like episode 2009   TIA - mini stroke per patient  .  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 ([place]) 07/13/2015  . Uncontrolled type 2 diabetes mellitus with hyperglycemia ([place]) 11/01/2017    Family History  Problem Relation Age of Onset  . Lung cancer Father   . Hypertension Father   . Arthritis Father   . Other Mother        hardening of the arteries/renal cell carcenoma  . Hypertension Mother   . Stroke Mother   . Kidney cancer Mother   . Arthritis Brother   . Rheum arthritis Maternal Uncle   .  Bladder Cancer Neg Hx     Past Surgical History:  Procedure Laterality Date  . ABDOMINAL HYSTERECTOMY    . AMPUTATION Left 12/06/2018   Procedure: PARTIAL AMPUTATION LEFT FOOT;  Surgeon: Edrick Kins, DPM;  Location: [facility];  Service: Podiatry;  Laterality: Left;  . AMPUTATION Left 05/16/2019   Procedure: LEFT BELOW KNEE AMPUTATION;  Surgeon: Newt Minion, MD;  Location: [place];  Service: Orthopedics;  Laterality: Left;  . BACK SURGERY  09/02/2015  . BONE BIOPSY Left 12/06/2018   Procedure: Bone Biopsy;  Surgeon: Edrick Kins, DPM;  Location: [place];  Service: Podiatry;  Laterality: Left;  . CERVICAL FUSION    . CESAREAN SECTION  1986  . COLONOSCOPY N/A 11/30/2012   Procedure: COLONOSCOPY;  Surgeon: Irene Shipper, MD;  Location: [facility];  Service: Endoscopy;  Laterality: N/A;  . COLONOSCOPY WITH PROPOFOL N/A 01/03/2016   Procedure: COLONOSCOPY WITH PROPOFOL;  Surgeon: Manya Silvas, MD;  Location: [facility] ENDOSCOPY;  Service: Endoscopy;  Laterality: N/A;  . ESOPHAGOGASTRODUODENOSCOPY (EGD) WITH PROPOFOL N/A 01/14/2015   Procedure: ESOPHAGOGASTRODUODENOSCOPY (EGD) WITH PROPOFOL;  Surgeon: Manya Silvas, MD;  Location: [facility] ENDOSCOPY;  Service: Endoscopy;  Laterality: N/A;  . IRRIGATION AND DEBRIDEMENT FOOT Left 03/13/2018   Procedure: IRRIGATION AND DEBRIDEMENT FOOT WITH BONE BIOPSY WITH MISONIX DEBRIDER;  Surgeon: Evelina Bucy, DPM;  Location: [place];  Service: Podiatry;  Laterality: Left;  . IRRIGATION AND DEBRIDEMENT  FOOT Left 12/06/2018   Procedure: Irrigation And Debridement Foot;  Surgeon: Edrick Kins, DPM;  Location: [place];  Service: Podiatry;  Laterality: Left;  . LUMBAR WOUND DEBRIDEMENT N/A 10/01/2015   Procedure: LUMBAR WOUND DEBRIDEMENT;  Surgeon: Ashok Pall, MD;  Location: [place] [facility];  Service: Neurosurgery;  Laterality: N/A;  LUMBAR WOUND DEBRIDEMENT  . NASAL SINUS SURGERY    . SAVORY DILATION N/A 01/14/2015   Procedure: SAVORY DILATION;  Surgeon: Manya Silvas, MD;  Location: [facility] ENDOSCOPY;  Service: Endoscopy;  Laterality: N/A;  . TONSILLECTOMY    . WISDOM TOOTH EXTRACTION     Social History   Occupational History  . Occupation: histology Engineer, production: [employer]  Tobacco Use  . Smoking status: Never Smoker  . Smokeless tobacco: Never Used  Substance and Sexual Activity  . Alcohol use: Never  . Drug use: No  . Sexual activity: Not Currently    Comment: Hysterectomy

## 2019-06-27 ENCOUNTER — Other Ambulatory Visit: Payer: Self-pay

## 2019-06-27 ENCOUNTER — Encounter: Payer: Self-pay | Admitting: Physician Assistant

## 2019-06-27 ENCOUNTER — Ambulatory Visit (INDEPENDENT_AMBULATORY_CARE_PROVIDER_SITE_OTHER): Payer: Medicare Other | Admitting: Physician Assistant

## 2019-06-27 DIAGNOSIS — M86172 Other acute osteomyelitis, left ankle and foot: Secondary | ICD-10-CM

## 2019-06-27 NOTE — Progress Notes (Signed)
Office Visit Note   Patient: Olivia Romero           Date of Birth: April 27, 1956           MRN: [id number] Visit Date: 06/27/2019              Requested by: [name], [place],  [place] [address] PCP: [name], FNP  Chief Complaint  Patient presents with  . Left Leg - Wound Check      HPI: This is a pleasant woman who is 5 weeks status post below-knee amputation.  She is overall doing well.  Assessment & Plan: Visit Diagnoses: No diagnosis found.  Plan: She will use her shrinker against the skin.  She will continue to follow-up on a weekly basis  Follow-Up Instructions: No follow-ups on file.   Ortho Exam  Patient is alert, oriented, no adenopathy, well-dressed, normal affect, normal respiratory effort. Focused examination demonstrates one retained suture medially which was not in both's wound edges and was removed.  Medially she still has 1 small 3 cm area that is healing it has some bloody drainage but no surrounding cellulitis no purulent drainage or foul odor the rest of the amputation stump wound is healing nicely   Imaging: No results found. No images are attached to the encounter.  Labs: Lab Results  Component Value Date   HGBA1C 7.3 (H) 05/16/2019   HGBA1C 7.9 09/17/2018   HGBA1C 7.0 05/08/2018   ESRSEDRATE 60 (H) 12/05/2018   ESRSEDRATE 41 (H) 12/04/2018   ESRSEDRATE 30 07/12/2018   CRP 22.0 (H) 12/05/2018   CRP 20 (H) 12/04/2018   CRP 6 07/12/2018   LABURIC 8.2 (H) 11/30/2015   REPTSTATUS 12/12/2018 FINAL 12/06/2018   GRAMSTAIN  12/06/2018    RARE WBC PRESENT, PREDOMINANTLY PMN NO ORGANISMS SEEN    CULT  12/06/2018    RARE METHICILLIN RESISTANT STAPHYLOCOCCUS AUREUS WITH NORMAL SKIN FLORA NO ANAEROBES ISOLATED Performed at [facility], [place] [address] [place], [place] [address]    LABORGA METHICILLIN RESISTANT STAPHYLOCOCCUS AUREUS 12/06/2018     Lab Results  Component Value Date   ALBUMIN  3.7 12/04/2018   ALBUMIN 4.0 11/27/2018   ALBUMIN 3.8 03/11/2018   PREALBUMIN 21.5 03/12/2018   LABURIC 8.2 (H) 11/30/2015    No results found for: MG Lab Results  Component Value Date   VD25OH 11 (L) 09/20/2016    Lab Results  Component Value Date   PREALBUMIN 21.5 03/12/2018   CBC EXTENDED Latest Ref Rng & Units 05/16/2019 12/11/2018 12/11/2018  WBC 4.0 - 10.5 K/uL 8.4 10.6(H) 11.6(H)  RBC 3.87 - 5.11 MIL/uL 4.35 3.73(L) 3.76(L)  HGB 12.0 - 15.0 g/dL 11.2(L) 9.8(L) 9.8(L)  HCT 36.0 - 46.0 % 36.1 30.8(L) 31.3(L)  PLT 150 - 400 K/uL 310 503(H) 482(H)  NEUTROABS 1.7 - 7.7 K/uL - - -  LYMPHSABS 0.7 - 4.0 K/uL - - -     There is no height or weight on file to calculate BMI.  Orders:  No orders of the defined types were placed in this encounter.  No orders of the defined types were placed in this encounter.    Procedures: No procedures performed  Clinical Data: No additional findings.  ROS:  All other systems negative, except as noted in the HPI. Review of Systems  Objective: Vital Signs: LMP  (LMP Unknown)   Specialty Comments:  No specialty comments available.  PMFS History: Patient Active Problem  List   Diagnosis Date Noted  . Gangrene of left foot ([place]) 05/16/2019  . Subacute osteomyelitis of left foot ([place])   . Charcot's joint of foot, left   . Post-operative state   . PAD (peripheral artery disease) ([place]) 12/05/2018  . Osteomyelitis of ankle or foot, acute, left ([place]) 12/05/2018  . MRSA bacteremia 12/05/2018  . Sepsis without acute organ dysfunction ([place])   . Charcot's joint of foot due to diabetes ([place]) 09/18/2018  . BMI 36.0-36.9,adult   . Morbid obesity ([place]) 11/19/2017  . B12 deficiency 11/01/2017  . Diabetic foot ulcer ([place]) 12/14/2016  . Diabetic polyneuropathy associated with type 2 diabetes mellitus ([place]) 12/13/2015  . Lumbar stenosis with neurogenic claudication 09/02/2015  . Chronic constipation 07/13/2015  . OSA (obstructive sleep  apnea) 06/25/2015  . 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/23/2015  . Hypertriglyceridemia 04/23/2015  . Asthma, mild intermittent 02/01/2015  . Type 2 diabetes mellitus with diabetic nephropathy ([place]) 12/31/2014  . Gastroesophageal reflux disease with esophagitis 12/31/2014  . Dysphagia 12/31/2014  . Bilateral carpal tunnel syndrome 12/01/2014  . Diverticulosis of colon (without mention of hemorrhage) 11/30/2012  . Essential hypertension, benign 11/28/2012   Past Medical History:  Diagnosis Date  . Anemia   . Arthritis   . Asthma   . Colon polyps    adenomatous  . Diabetes mellitus without complication ([place])   . Diabetic infection of left foot ([place]) 12/2018  . Diverticulosis of colon   . Esophagitis   . GERD (gastroesophageal reflux disease)   . Hemorrhoid    internal  . Hyperlipemia   . Hypertension   . IBS (irritable bowel syndrome)    no current prob - diet controlled  . Myalgia due to statin 11/19/2017  . Neuropathy   . Neuropathy of both feet   . Neuropathy of hand   . Pneumonia    x 4  . PONV (postoperative nausea and vomiting)    on some surgeries but not all procedures  . Skin ulcer of right ankle, limited to breakdown of skin ([place]) 12/31/2016   resolved per patient 05/14/19  . Sleep apnea    has had in the past lost 50 pounds and do longer uses cpap  . Statin intolerance 04/23/2015  . Stroke [facility]) 2009   mini stroke per patient  . Stroke-like episode 2009   TIA - mini stroke per patient  . 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 ([employer]) 07/13/2015  . Uncontrolled type 2 diabetes mellitus with hyperglycemia ([place]) 11/01/2017    Family History  Problem Relation Age of Onset  . Lung cancer Father   . Hypertension Father   . Arthritis Father   . Other Mother        hardening of the arteries/renal cell carcenoma  . Hypertension Mother   . Stroke Mother   . Kidney cancer Mother   . Arthritis Brother   . Rheum arthritis Maternal  Uncle   . Bladder Cancer Neg Hx     Past Surgical History:  Procedure Laterality Date  . ABDOMINAL HYSTERECTOMY    . AMPUTATION Left 12/06/2018   Procedure: PARTIAL AMPUTATION LEFT FOOT;  Surgeon: Edrick Kins, DPM;  Location: [place];  Service: Podiatry;  Laterality: Left;  . AMPUTATION Left 05/16/2019   Procedure: LEFT BELOW KNEE AMPUTATION;  Surgeon: Newt Minion, MD;  Location: [place];  Service: Orthopedics;  Laterality: Left;  . BACK SURGERY  09/02/2015  . BONE BIOPSY Left 12/06/2018   Procedure: Bone  Biopsy;  Surgeon: Edrick Kins, DPM;  [facility];  Service: Podiatry;  Laterality: Left;  . CERVICAL FUSION    . CESAREAN SECTION  1986  . COLONOSCOPY N/A 11/30/2012   Procedure: COLONOSCOPY;  Surgeon: Irene Shipper, MD;  [facility];  Service: Endoscopy;  Laterality: N/A;  . COLONOSCOPY WITH PROPOFOL N/A 01/03/2016   Procedure: COLONOSCOPY WITH PROPOFOL;  Surgeon: Manya Silvas, MD;  Location[facility] ENDOSCOPY;  Service: Endoscopy;  Laterality: N/A;  . ESOPHAGOGASTRODUODENOSCOPY (EGD) WITH PROPOFOL N/A 01/14/2015   Procedure: ESOPHAGOGASTRODUODENOSCOPY (EGD) WITH PROPOFOL;  Surgeon: Manya Silvas, MD;  Location: [facility] ENDOSCOPY;  Service: Endoscopy;  Laterality: N/A;  . IRRIGATION AND DEBRIDEMENT FOOT Left 03/13/2018   Procedure: IRRIGATION AND DEBRIDEMENT FOOT WITH BONE BIOPSY WITH MISONIX DEBRIDER;  Surgeon: Evelina Bucy, DPM;  Location: [place];  Service: Podiatry;  Laterality: Left;  . IRRIGATION AND DEBRIDEMENT FOOT Left 12/06/2018   Procedure: Irrigation And Debridement Foot;  Surgeon: Edrick Kins, DPM;  Location: [place];  Service: Podiatry;  Laterality: Left;  . LUMBAR WOUND DEBRIDEMENT N/A 10/01/2015   Procedure: LUMBAR WOUND DEBRIDEMENT;  Surgeon: Ashok Pall, MD;  Location: [place] [facility];  Service: Neurosurgery;  Laterality: N/A;  LUMBAR WOUND DEBRIDEMENT  . NASAL SINUS SURGERY    . SAVORY DILATION N/A 01/14/2015   Procedure: SAVORY DILATION;  Surgeon: Manya Silvas, MD;  Location: [facility] ENDOSCOPY;  Service: Endoscopy;  Laterality: N/A;  . TONSILLECTOMY    . WISDOM TOOTH EXTRACTION     Social History   Occupational History  . Occupation: histology Engineer, production: [employer]  Tobacco Use  . Smoking status: Never Smoker  . Smokeless tobacco: Never Used  Substance and Sexual Activity  . Alcohol use: Never  . Drug use: No  . Sexual activity: Not Currently    Comment: Hysterectomy

## 2019-06-30 ENCOUNTER — Other Ambulatory Visit: Payer: Self-pay

## 2019-06-30 ENCOUNTER — Ambulatory Visit (INDEPENDENT_AMBULATORY_CARE_PROVIDER_SITE_OTHER): Payer: Medicare Other | Admitting: Podiatry

## 2019-06-30 DIAGNOSIS — M7741 Metatarsalgia, right foot: Secondary | ICD-10-CM

## 2019-06-30 DIAGNOSIS — M14671 Charcot's joint, right ankle and foot: Secondary | ICD-10-CM

## 2019-06-30 DIAGNOSIS — E1149 Type 2 diabetes mellitus with other diabetic neurological complication: Secondary | ICD-10-CM | POA: Diagnosis not present

## 2019-06-30 DIAGNOSIS — M21541 Acquired clubfoot, right foot: Secondary | ICD-10-CM | POA: Diagnosis not present

## 2019-06-30 NOTE — Progress Notes (Signed)
Subjective: 64 year old female presents the office today for follow-up evaluation of right foot pain.  She states that she is no longer having the discomfort she had when she came in.  She is home now from rehab and she is recovered from [place].  Her main concern is she feels the right ankle started to turn in as she is using the side more.  She has been using Ace bandage which been helpful.  Denies any systemic complaints such as fevers, chills, nausea, vomiting. No acute changes since last appointment, and no other complaints at this time.   Objective: AAO x3, NAD DP/PT pulses palpable bilaterally, CRT less than 3 seconds Status post left below-knee amputation On the right foot there is prominent metatarsal heads plantarly with atrophy of the fat pad.  There is no area of tenderness and there is no edema, erythema.  There is no skin breakdown identified to the right side.  She is starting to have more of a cavovarus deformity although flexible. . No pain with calf compression, swelling, warmth, erythema  Assessment: Right foot prominent metatarsal heads, varus deformity right ankle with history of Charcot  Plan: -All treatment options discussed with the patient including all alternatives, risks, complications.  -Foot pain is resolved.  I do want her to continue offloading of the metatarsal heads.  I dispensed a Tri-Lock ankle brace to use for now but I do think try to make her more custom brace with a shoe would be helpful to avoid the right ankle turning more and to avoid any skin breakdown.  She will follow-up with Liliane Channel for this. -Discussed the importance of daily foot inspection.  Trula Slade DPM

## 2019-06-30 NOTE — Patient Instructions (Signed)
For instructions on how to put on your Tri-Lock Ankle Brace, please visit [email or web address] 

## 2019-07-03 ENCOUNTER — Telehealth: Payer: Self-pay | Admitting: *Deleted

## 2019-07-03 NOTE — Telephone Encounter (Signed)
Pt called and states yesterday afternoon and she was standing the right foot turned over and now it is turned to the outside and is red on the bottom. I told pt I would prefer her come in to be evaluated tomorrow and transferred to schedulers.

## 2019-07-04 ENCOUNTER — Other Ambulatory Visit: Payer: Self-pay

## 2019-07-04 ENCOUNTER — Encounter: Payer: Self-pay | Admitting: Podiatry

## 2019-07-04 ENCOUNTER — Ambulatory Visit (INDEPENDENT_AMBULATORY_CARE_PROVIDER_SITE_OTHER): Payer: Medicare Other

## 2019-07-04 ENCOUNTER — Ambulatory Visit (INDEPENDENT_AMBULATORY_CARE_PROVIDER_SITE_OTHER): Payer: Medicare Other | Admitting: Podiatry

## 2019-07-04 ENCOUNTER — Ambulatory Visit (INDEPENDENT_AMBULATORY_CARE_PROVIDER_SITE_OTHER): Payer: Medicare Other | Admitting: Physician Assistant

## 2019-07-04 ENCOUNTER — Encounter: Payer: Self-pay | Admitting: Physician Assistant

## 2019-07-04 VITALS — Temp 97.4°F

## 2019-07-04 VITALS — Ht 60.0 in | Wt >= 6400 oz

## 2019-07-04 DIAGNOSIS — S93401A Sprain of unspecified ligament of right ankle, initial encounter: Secondary | ICD-10-CM | POA: Diagnosis not present

## 2019-07-04 DIAGNOSIS — S9032XA Contusion of left foot, initial encounter: Secondary | ICD-10-CM | POA: Diagnosis not present

## 2019-07-04 DIAGNOSIS — S9002XA Contusion of left ankle, initial encounter: Secondary | ICD-10-CM

## 2019-07-04 DIAGNOSIS — L02612 Cutaneous abscess of left foot: Secondary | ICD-10-CM

## 2019-07-04 DIAGNOSIS — M21171 Varus deformity, not elsewhere classified, right ankle: Secondary | ICD-10-CM | POA: Diagnosis not present

## 2019-07-04 NOTE — Progress Notes (Signed)
Office Visit Note   Patient: Olivia Romero           Date of Birth: May 20, 1956           MRN: [id number] Visit Date: 07/04/2019              Requested by: Olivia Romero, [place],  [place] [address] PCP: Olivia Hartshorn, FNP  Chief Complaint  Patient presents with  . 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  Plan: She will continue to use the shrinker.  She will follow-up in 1 to 2 weeks. Follow-Up Instructions: No follow-ups on file.   Ortho Exam  Patient is alert, oriented, no adenopathy, well-dressed, normal affect, normal respiratory effort. Focused examination demonstrates stump is almost completely healed there is 1 area medially that still has some serous drainage but there is no dehiscence and the wound edges appear healthy no foul odor no cellulitis  Imaging: No results found. No images are attached to the encounter.  Labs: Lab Results  Component Value Date   HGBA1C 7.3 (H) 05/16/2019   HGBA1C 7.9 09/17/2018   HGBA1C 7.0 05/08/2018   ESRSEDRATE 60 (H) 12/05/2018   ESRSEDRATE 41 (H) 12/04/2018   ESRSEDRATE 30 07/12/2018   CRP 22.0 (H) 12/05/2018   CRP 20 (H) 12/04/2018   CRP 6 07/12/2018   LABURIC 8.2 (H) 11/30/2015   REPTSTATUS 12/12/2018 FINAL 12/06/2018   GRAMSTAIN  12/06/2018    RARE WBC PRESENT, PREDOMINANTLY PMN NO ORGANISMS SEEN    CULT  12/06/2018    RARE METHICILLIN RESISTANT STAPHYLOCOCCUS AUREUS WITH NORMAL SKIN FLORA NO ANAEROBES ISOLATED Performed at [facility], [place] [address] [place], [place] [address]    LABORGA METHICILLIN RESISTANT STAPHYLOCOCCUS AUREUS 12/06/2018     Lab Results  Component Value Date   ALBUMIN 3.7 12/04/2018   ALBUMIN 4.0 11/27/2018   ALBUMIN 3.8 03/11/2018   PREALBUMIN 21.5 03/12/2018   LABURIC 8.2 (H) 11/30/2015    No results found for: MG Lab Results  Component Value Date   VD25OH 11 (L) 09/20/2016    Lab Results  Component Value Date   PREALBUMIN 21.5 03/12/2018   CBC EXTENDED Latest Ref Rng & Units 05/16/2019 12/11/2018 12/11/2018  WBC 4.0 - 10.5 K/uL 8.4 10.6(H) 11.6(H)  RBC 3.87 - 5.11 MIL/uL 4.35 3.73(L) 3.76(L)  HGB 12.0 - 15.0 g/dL 11.2(L) 9.8(L) 9.8(L)  HCT 36.0 - 46.0 % 36.1 30.8(L) 31.3(L)  PLT 150 - 400 K/uL 310 503(H) 482(H)  NEUTROABS 1.7 - 7.7 K/uL - - -  LYMPHSABS 0.7 - 4.0 K/uL - - -     Body mass index is 80.07 kg/m.  Orders:  No orders of the defined types were placed in this encounter.  No orders of the defined types were placed in this encounter.    Procedures: No procedures performed  Clinical Data: No additional findings.  ROS:  All other systems negative, except as noted in the HPI. Review of Systems  Objective: Vital Signs: Ht 5' (1.524  m)   Wt (!) 410 lb (186 kg)   LMP  (LMP Unknown)   BMI 80.07 kg/m   Specialty Comments:  No specialty comments available.  PMFS History: Patient Active Problem List   Diagnosis Date Noted  . Gangrene of left foot ([place]) 05/16/2019  . Subacute osteomyelitis of left foot ([place])   . Charcot's joint of foot, left   . Post-operative state   . PAD (peripheral artery disease) ([place]) 12/05/2018  . Osteomyelitis of ankle or foot, acute, left ([place]) 12/05/2018  . MRSA bacteremia 12/05/2018  . Sepsis without acute organ dysfunction ([place])   . Charcot's joint of foot due to diabetes ([place]) 09/18/2018  . BMI 36.0-36.9,adult   . Morbid obesity ([place]) 11/19/2017  . B12 deficiency 11/01/2017  . Diabetic foot ulcer ([place]) 12/14/2016  . Diabetic polyneuropathy associated with type 2  diabetes mellitus ([place]) 12/13/2015  . Lumbar stenosis with neurogenic claudication 09/02/2015  . Chronic constipation 07/13/2015  . OSA (obstructive sleep apnea) 06/25/2015  . 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/23/2015  . Hypertriglyceridemia 04/23/2015  . Asthma, mild intermittent 02/01/2015  . Type 2 diabetes mellitus with diabetic nephropathy ([place]) 12/31/2014  . Gastroesophageal reflux disease with esophagitis 12/31/2014  . Dysphagia 12/31/2014  . Bilateral carpal tunnel syndrome 12/01/2014  . Diverticulosis of colon (without mention of hemorrhage) 11/30/2012  . Essential hypertension, benign 11/28/2012   Past Medical History:  Diagnosis Date  . Anemia   . Arthritis   . Asthma   . Colon polyps    adenomatous  . Diabetes mellitus without complication ([place])   . Diabetic infection of left foot ([place]) 12/2018  . Diverticulosis of colon   . Esophagitis   . GERD (gastroesophageal reflux disease)   . Hemorrhoid    internal  . Hyperlipemia   . Hypertension   . IBS (irritable bowel syndrome)    no current prob - diet controlled  . Myalgia due to statin 11/19/2017  . Neuropathy   . Neuropathy of both feet   . Neuropathy of hand   . Pneumonia    x 4  . PONV (postoperative nausea and vomiting)    on some surgeries but not all procedures  . Skin ulcer of right ankle, limited to breakdown of skin ([place]) 12/31/2016   resolved per patient 05/14/19  . Sleep apnea    has had in the past lost 50 pounds and do longer uses cpap  . Statin intolerance 04/23/2015  . Stroke [employer]) 2009   mini stroke per patient  . Stroke-like episode 2009   TIA - mini stroke per patient  . 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 ([place]) 07/13/2015  . Uncontrolled type 2 diabetes mellitus with hyperglycemia ([place]) 11/01/2017    Family History  Problem Relation Age of Onset  . Lung cancer Father   . Hypertension Father   . Arthritis Father   . Other Mother        hardening of  the arteries/renal cell carcenoma  . Hypertension Mother   . Stroke Mother   . Kidney cancer Mother   . Arthritis Brother   . Rheum arthritis Maternal Uncle   . Bladder Cancer Neg Hx     Past Surgical History:  Procedure Laterality Date  . ABDOMINAL HYSTERECTOMY    . AMPUTATION Left 12/06/2018   Procedure: PARTIAL AMPUTATION LEFT FOOT;  Surgeon: Edrick Kins, DPM;  Location: [facility];  Service: Podiatry;  Laterality: Left;  . AMPUTATION Left 05/16/2019   Procedure: LEFT  BELOW KNEE AMPUTATION;  Surgeon: Newt Minion, MD;  Location: [facility];  Service: Orthopedics;  Laterality: Left;  . BACK SURGERY  09/02/2015  . BONE BIOPSY Left 12/06/2018   Procedure: Bone Biopsy;  Surgeon: Edrick Kins, DPM;  Location: [facility];  Service: Podiatry;  Laterality: Left;  . CERVICAL FUSION    . CESAREAN SECTION  1986  . COLONOSCOPY N/A 11/30/2012   Procedure: COLONOSCOPY;  Surgeon: Irene Shipper, MD;  Location: [facility];  Service: Endoscopy;  Laterality: N/A;  . COLONOSCOPY WITH PROPOFOL N/A 01/03/2016   Procedure: COLONOSCOPY WITH PROPOFOL;  Surgeon: Manya Silvas, MD;  Location[facility] END[facility]COPY;  Service: Endoscopy;  Laterality: N/A;  . ESOPHAGOGASTRODUODENOSCOPY (EGD) WITH PROPOFOL N/A 01/14/2015   Procedure: ESOPHAGOGASTRODUODENOSCOPY (EGD) WITH PROPOFOL;  Surgeon: Manya Silvas, MD;  Location: [facility] ENDOSCOPY;  Service: Endoscopy;  Laterality: N/A;  . IRRIGATION AND DEBRIDEMENT FOOT Left 03/13/2018   Procedure: IRRIGATION AND DEBRIDEMENT FOOT WITH BONE BIOPSY WITH MISONIX DEBRIDER;  Surgeon: Evelina Bucy, DPM;  Location: [facility];  Service: Podiatry;  Laterality: Left;  . IRRIGATION AND DEBRIDEMENT FOOT Left 12/06/2018   Procedure: Irrigation And Debridement Foot;  Surgeon: Edrick Kins, DPM;  Location: [facility];  Service: Podiatry;  Laterality: Left;  . LUMBAR WOUND DEBRIDEMENT N/A 10/01/2015   Procedure: LUMBAR WOUND DEBRIDEMENT;  Surgeon: Ashok Pall, MD;  Location: [facility];  Service:  Neurosurgery;  Laterality: N/A;  LUMBAR WOUND DEBRIDEMENT  . NASAL SINUS SURGERY    . SAVORY DILATION N/A 01/14/2015   Procedure: SAVORY DILATION;  Surgeon: Manya Silvas, MD;  Location: [facility] ENDOSCOPY;  Service: Endoscopy;  Laterality: N/A;  . TONSILLECTOMY    . WISDOM TOOTH EXTRACTION     Social History   Occupational History  . Occupation: histology Engineer, production: [employer]  Tobacco Use  . Smoking status: Never Smoker  . Smokeless tobacco: Never Used  Substance and Sexual Activity  . Alcohol use: Never  . Drug use: No  . Sexual activity: Not Currently    Comment: Hysterectomy

## 2019-07-04 NOTE — Progress Notes (Signed)
Subjective: 64 year old female presents the office today for an acute appointment.  Yesterday she states that she was in the bathroom and her right ankle gave out and she injured her ankle.  She had some swelling this morning was feeling better.  She did put weight on her foot this morning she only felt some pressure but the swelling is much improved there is been no bruising. Denies any systemic complaints such as fevers, chills, nausea, vomiting. No acute changes since last appointment, and no other complaints at this time.   Objective: AAO x3, NAD DP/PT pulses palpable , CRT less than 3 seconds Flexible varus deformity present to the right lower extremity.  There is no pain to the ankle and there is minimal edema.  There is no pain in the foot.  No edema to the foot. No open lesions or pre-ulcerative lesions.  No pain with calf compression, swelling, warmth, erythema  Assessment: Right ankle sprain, flexible varus deformity  Plan: -All treatment options discussed with the patient including all alternatives, risks, complications.  -X-rays obtained reviewed.  There is no evidence of acute fracture or stress fracture identified today.  Chronic changes are present mild arthritic changes. -On her continue Tri-Lock ankle brace.  She is scheduled to see you back on Tuesday for bracing of the right ankle.  We need to make sure that we are controlling the varus deformity to prevent any worsening as all her weight is on her right side currently. -Patient encouraged to call the office with any questions, concerns, change in symptoms.   Trula Slade DPM

## 2019-07-08 ENCOUNTER — Other Ambulatory Visit: Payer: Self-pay

## 2019-07-08 ENCOUNTER — Ambulatory Visit: Payer: Medicare Other | Admitting: Orthotics

## 2019-07-08 DIAGNOSIS — M21171 Varus deformity, not elsewhere classified, right ankle: Secondary | ICD-10-CM

## 2019-07-08 DIAGNOSIS — E1149 Type 2 diabetes mellitus with other diabetic neurological complication: Secondary | ICD-10-CM

## 2019-07-08 DIAGNOSIS — S9032XA Contusion of left foot, initial encounter: Secondary | ICD-10-CM

## 2019-07-08 DIAGNOSIS — S93401A Sprain of unspecified ligament of right ankle, initial encounter: Secondary | ICD-10-CM

## 2019-07-08 NOTE — Progress Notes (Signed)
Patient presents today for evaluation/casting for AFO brace (r).   Patient has hx of the following conditions: Gait instability,  Ankle instabilty,  Gait analysis done and patient displays abnormality of gait in both sagittial and frontal planes, and could benefit in aggressive ankle support.   Patient also has a tendency to lay on outside of foot; so brace was cast in neurtral position w/ 4* valgus RF posting.   Patient chose [place] brace w/velcro.

## 2019-07-11 ENCOUNTER — Ambulatory Visit (INDEPENDENT_AMBULATORY_CARE_PROVIDER_SITE_OTHER): Payer: Medicare Other | Admitting: Physician Assistant

## 2019-07-11 ENCOUNTER — Other Ambulatory Visit: Payer: Self-pay

## 2019-07-11 ENCOUNTER — Encounter: Payer: Self-pay | Admitting: Physician Assistant

## 2019-07-11 DIAGNOSIS — M86172 Other acute osteomyelitis, left ankle and foot: Secondary | ICD-10-CM

## 2019-07-11 DIAGNOSIS — L02612 Cutaneous abscess of left foot: Secondary | ICD-10-CM

## 2019-07-11 NOTE — Progress Notes (Signed)
Office Visit Note   Patient: Olivia Romero           Date of Birth: 1956-01-26           MRN: [id number] Visit Date: 07/11/2019              Requested by: [name], [place],  [place] [address] PCP: [name], FNP  No chief complaint on file.     HPI Patient is 6 weeks s/p left below-knee amputation.  Overall she is doing quite well and improves every week.  Her only concern is of a very small area on the medial portion of the incision that is slightly dehisced and earlier today when she was bathing she pulled out what she believes to be a small piece of white string  Assessment & Plan: Visit Diagnoses: No diagnosis found.  Plan: Findings most consistent with a suture abscess.  I have placed the small amount of Silvadene with a Band-Aid over this area now.  I have instructed her to remove it tomorrow when she takes a shower and to continue using the sock will follow up with her in 1 week.  If she has any increased drainage or concerns she is to call us immediately.  I would expect she would be ready to go forward with fabrication of her prosthetic  Follow-Up Instructions: No follow-ups on file.   Ortho Exam  Patient is alert, oriented, no adenopathy, well-dressed, normal affect, normal respiratory effort. Focused examination demonstrates well-healed surgical incision.  Swelling is well controlled no erythema no fluctuance.  There is a small half centimeter area on the medial side of the wound that just has a small amount of drainage with some what looks like suture debris.  I did debride this to healthy tissue.  It does not probe deeply there is no foul odor  Imaging: No results found. No images are attached to the encounter.  Labs: Lab Results  Component Value Date   HGBA1C 7.3 (H) 05/16/2019   HGBA1C 7.9 09/17/2018   HGBA1C 7.0 05/08/2018   ESRSEDRATE 60 (H) 12/05/2018   ESRSEDRATE 41 (H) 12/04/2018   ESRSEDRATE 30 07/12/2018   CRP 22.0 (H) 12/05/2018   CRP 20 (H) 12/04/2018   CRP 6 07/12/2018   LABURIC 8.2 (H) 11/30/2015   REPTSTATUS 12/12/2018 FINAL 12/06/2018   GRAMSTAIN  12/06/2018    RARE WBC PRESENT, PREDOMINANTLY PMN NO ORGANISMS SEEN    CULT  12/06/2018    RARE METHICILLIN RESISTANT STAPHYLOCOCCUS AUREUS WITH NORMAL SKIN FLORA NO ANAEROBES ISOLATED Performed at [facility], [place] [address], [place], [place] [address]    LABORGA METHICILLIN RESISTANT STAPHYLOCOCCUS AUREUS 12/06/2018     Lab Results  Component Value Date   ALBUMIN 3.7 12/04/2018   ALBUMIN 4.0 11/27/2018   ALBUMIN 3.8 03/11/2018   PREALBUMIN 21.5 03/12/2018   LABURIC 8.2 (H) 11/30/2015    No results found for: MG Lab Results  Component Value Date   VD25OH 11 (L) 09/20/2016    Lab Results  Component Value Date   PREALBUMIN 21.5 03/12/2018   CBC EXTENDED Latest Ref Rng & Units 05/16/2019 12/11/2018 12/11/2018  WBC 4.0 - 10.5 K/uL 8.4 10.6(H) 11.6(H)  RBC 3.87 - 5.11 MIL/uL 4.35 3.73(L) 3.76(L)  HGB 12.0 - 15.0 g/dL 11.2(L) 9.8(L) 9.8(L)  HCT 36.0 - 46.0 % 36.1 30.8(L) 31.3(L)  PLT 150 - 400 K/uL 310 503(H) 482(H)  NEUTROABS 1.7 - 7.7 K/uL - - -  LYMPHSABS 0.7 - 4.0 K/uL - - -     There is no height or weight on file to calculate BMI.  Orders:  No orders of the defined types were placed in this encounter.  No orders of the defined types were placed in this encounter.    Procedures: No procedures performed  Clinical Data: No additional findings.  ROS:  All other systems negative, except as noted in the HPI. Review of Systems  Objective: Vital Signs: LMP  (LMP Unknown)   Specialty Comments:  No specialty comments available.  PMFS History: Patient Active Problem List   Diagnosis Date Noted  . Gangrene of left foot ([place]) 05/16/2019  . Subacute osteomyelitis of left foot ([place])   . Charcot's joint of foot, left   . Post-operative state   . PAD (peripheral artery disease) ([place]) 12/05/2018  .  Osteomyelitis of ankle or foot, acute, left ([place]) 12/05/2018  . MRSA bacteremia 12/05/2018  . Sepsis without acute organ dysfunction ([place])   . Charcot's joint of foot due to diabetes ([place]) 09/18/2018  . BMI 36.0-36.9,adult   . Morbid obesity ([place]) 11/19/2017  . B12 deficiency 11/01/2017  . Diabetic foot ulcer ([place]) 12/14/2016  . Diabetic polyneuropathy associated with type 2 diabetes mellitus ([place]) 12/13/2015  . Lumbar stenosis with neurogenic claudication 09/02/2015  . Chronic constipation 07/13/2015  . OSA (obstructive sleep apnea) 06/25/2015  . 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/23/2015  . Hypertriglyceridemia 04/23/2015  . Asthma, mild intermittent 02/01/2015  . Type 2 diabetes mellitus with diabetic nephropathy ([place]) 12/31/2014  . Gastroesophageal reflux disease with esophagitis 12/31/2014  . Dysphagia 12/31/2014  . Bilateral carpal tunnel syndrome 12/01/2014  . Diverticulosis of colon (without mention of hemorrhage) 11/30/2012  . Essential hypertension, benign 11/28/2012   Past Medical History:  Diagnosis Date  . Anemia   . Arthritis   . Asthma   . Colon polyps    adenomatous  . Diabetes mellitus without complication ([place])   . Diabetic infection of left foot ([place]) 12/2018  . Diverticulosis of colon   . Esophagitis   . GERD (gastroesophageal reflux disease)   . Hemorrhoid    internal  . Hyperlipemia   . Hypertension   . IBS (irritable bowel syndrome)    no current prob - diet controlled  . Myalgia due to statin 11/19/2017  . Neuropathy   . Neuropathy of both feet   . Neuropathy of hand   . Pneumonia    x 4  . PONV (postoperative nausea and vomiting)    on some surgeries but not all procedures  . Skin ulcer of right ankle, limited to breakdown of skin ([place]) 12/31/2016   resolved per patient 05/14/19  . Sleep apnea    has had in the past lost 50 pounds and do longer uses cpap  . Statin intolerance 04/23/2015  . Stroke [facility]) 2009   mini stroke  per patient  . Stroke-like episode 2009   TIA - mini stroke per patient  . 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 ([place]) 07/13/2015  . Uncontrolled type 2 diabetes mellitus with hyperglycemia ([place]) 11/01/2017    Family History  Problem Relation Age of Onset  . Lung cancer Father   . Hypertension Father   . Arthritis Father   . Other Mother        hardening of the arteries/renal cell carcenoma  . Hypertension Mother   . Stroke Mother   . Kidney cancer Mother   . Arthritis Brother   .  Rheum arthritis Maternal Uncle   . Bladder Cancer Neg Hx     Past Surgical History:  Procedure Laterality Date  . ABDOMINAL HYSTERECTOMY    . AMPUTATION Left 12/06/2018   Procedure: PARTIAL AMPUTATION LEFT FOOT;  Surgeon: Edrick Kins, DPM;  Location: [facility];  Service: Podiatry;  Laterality: Left;  . AMPUTATION Left 05/16/2019   Procedure: LEFT BELOW KNEE AMPUTATION;  Surgeon: Newt Minion, MD;  Location: [facility];  Service: Orthopedics;  Laterality: Left;  . BACK SURGERY  09/02/2015  . BONE BIOPSY Left 12/06/2018   Procedure: Bone Biopsy;  Surgeon: Edrick Kins, DPM;  Location: [facility];  Service: Podiatry;  Laterality: Left;  . CERVICAL FUSION    . CESAREAN SECTION  1986  . COLONOSCOPY N/A 11/30/2012   Procedure: COLONOSCOPY;  Surgeon: Irene Shipper, MD;  [facility]COPY;  Service: Endoscopy;  Laterality: N/A;  . COLONOSCOPY WITH PROPOFOL N/A 01/03/2016   Procedure: COLONOSCOPY WITH PROPOFOL;  Surgeon: Manya Silvas, MD;  Location: [facility] ENDOSCOPY;  Service: Endoscopy;  Laterality: N/A;  . ESOPHAGOGASTRODUODENOSCOPY (EGD) WITH PROPOFOL N/A 01/14/2015   Procedure: ESOPHAGOGASTRODUODENOSCOPY (EGD) WITH PROPOFOL;  Surgeon: Manya Silvas, MD;  Location[facility]COPY;  Service: Endoscopy;  Laterality: N/A;  . IRRIGATION AND DEBRIDEMENT FOOT Left 03/13/2018   Procedure: IRRIGATION AND DEBRIDEMENT FOOT WITH BONE BIOPSY WITH MISONIX DEBRIDER;  Surgeon: Evelina Bucy, DPM;  Location:  [facility];  Service: Podiatry;  Laterality: Left;  . IRRIGATION AND DEBRIDEMENT FOOT Left 12/06/2018   Procedure: Irrigation And Debridement Foot;  Surgeon: Edrick Kins, DPM;  Location: [facility];  Service: Podiatry;  Laterality: Left;  . LUMBAR WOUND DEBRIDEMENT N/A 10/01/2015   Procedure: LUMBAR WOUND DEBRIDEMENT;  Surgeon: Ashok Pall, MD;  Location: [facility]O [facility]S;  Service: Neurosurgery;  Laterality: N/A;  LUMBAR WOUND DEBRIDEMENT  . NASAL SINUS SURGERY    . SAVORY DILATION N/A 01/14/2015   Procedure: SAVORY DILATION;  Surgeon: Manya Silvas, MD;  Location: [facility] ENDOSCOPY;  Service: Endoscopy;  Laterality: N/A;  . TONSILLECTOMY    . WISDOM TOOTH EXTRACTION     Social History   Occupational History  . Occupation: histology Engineer, production: [employer]  Tobacco Use  . Smoking status: Never Smoker  . Smokeless tobacco: Never Used  Substance and Sexual Activity  . Alcohol use: Never  . Drug use: No  . Sexual activity: Not Currently    Comment: Hysterectomy

## 2019-07-14 ENCOUNTER — Encounter: Payer: Self-pay | Admitting: Orthopedic Surgery

## 2019-07-14 ENCOUNTER — Telehealth: Payer: Self-pay | Admitting: Orthopedic Surgery

## 2019-07-14 NOTE — Telephone Encounter (Signed)
Mychart message sent to [facility] to review photos

## 2019-07-14 NOTE — Telephone Encounter (Signed)
Pt called wanting to let Audrea Muscat know that Dr. Sharol Given received the pictures they discussed her sending.   [phone]

## 2019-07-17 ENCOUNTER — Ambulatory Visit: Payer: Medicare Other | Admitting: Podiatry

## 2019-07-17 ENCOUNTER — Ambulatory Visit (INDEPENDENT_AMBULATORY_CARE_PROVIDER_SITE_OTHER): Payer: Medicare Other | Admitting: Orthopedic Surgery

## 2019-07-17 ENCOUNTER — Encounter: Payer: Self-pay | Admitting: Physician Assistant

## 2019-07-17 ENCOUNTER — Telehealth: Payer: Self-pay | Admitting: Orthopedic Surgery

## 2019-07-17 ENCOUNTER — Other Ambulatory Visit: Payer: Self-pay

## 2019-07-17 VITALS — Ht 60.0 in | Wt >= 6400 oz

## 2019-07-17 DIAGNOSIS — Z89512 Acquired absence of left leg below knee: Secondary | ICD-10-CM

## 2019-07-17 NOTE — Telephone Encounter (Signed)
Pt called in requesting a call back from someone regarding her visit today and some options they discussed today.  Please give her a call    [phone]

## 2019-07-18 ENCOUNTER — Encounter: Payer: Self-pay | Admitting: Orthopedic Surgery

## 2019-07-18 ENCOUNTER — Ambulatory Visit: Payer: Medicare Other | Admitting: Physician Assistant

## 2019-07-18 NOTE — Telephone Encounter (Signed)
Please see below.

## 2019-07-18 NOTE — Progress Notes (Signed)
Office Visit Note   Patient: Olivia Romero           Date of Birth: [birth year]           MRN: [id number] Visit Date: 07/17/2019              Requested by: [name], [place] [place],  [place] [address] PCP: [name], FNP  Chief Complaint  Patient presents with  . Left Leg - Routine Post Op    05/16/19 left BKA       HPI: Patient is a 64 year old woman who is 2 months status post left transtibial amputation she is currently wearing a stump shrinker she states she has 1 small open area over the lateral aspect of the residual limb.  Patient is working with Museum/gallery curator for prosthetic fitting.  Assessment & Plan: Visit Diagnoses:  1. History of left below knee amputation ([place])     Plan: Will have patient follow-up with [facility] for the prosthetic fitting.  Patient states that with her insurance that is expensive to go to therapy.  Recommended once she obtains her leg that she could go to limited visits with Shirlean Mylar upstairs and work on the training at home on her own.  Follow-Up Instructions: Return in about 4 weeks (around 08/14/2019).   Ortho Exam  Patient is alert, oriented, no adenopathy, well-dressed, normal affect, normal respiratory effort. Examination the incision is well-healed there is good consolidation there is no cellulitis.  She has 1 small fibrinous wound that is about 3 mm in diameter 1 mm deep that was most likely a sterile abscess over the suture knot.  Imaging: No results found.   Labs: Lab Results  Component Value Date   HGBA1C 7.3 (H) 05/16/2019   HGBA1C 7.9 09/17/2018   HGBA1C 7.0 05/08/2018   ESRSEDRATE 60 (H) 12/05/2018   ESRSEDRATE 41 (H) 12/04/2018   ESRSEDRATE 30 07/12/2018   CRP 22.0 (H) 12/05/2018   CRP 20 (H) 12/04/2018   CRP 6 07/12/2018   LABURIC 8.2 (H) 11/30/2015   REPTSTATUS 12/12/2018 FINAL 12/06/2018   GRAMSTAIN  12/06/2018    RARE WBC PRESENT, PREDOMINANTLY PMN NO ORGANISMS SEEN    CULT  12/06/2018      RARE METHICILLIN RESISTANT STAPHYLOCOCCUS AUREUS WITH NORMAL SKIN FLORA NO ANAEROBES ISOLATED Performed at [facility], [place] [address] [place], [place] [address]    LABORGA METHICILLIN RESISTANT STAPHYLOCOCCUS AUREUS 12/06/2018     Lab Results  Component Value Date   ALBUMIN 3.7 12/04/2018   ALBUMIN 4.0 11/27/2018   ALBUMIN 3.8 03/11/2018   PREALBUMIN 21.5 03/12/2018   LABURIC 8.2 (H) 11/30/2015    No results found for: MG Lab Results  Component Value Date   VD25OH 11 (L) 09/20/2016    Lab Results  Component Value Date   PREALBUMIN 21.5 03/12/2018   CBC EXTENDED Latest Ref Rng & Units 05/16/2019 12/11/2018 12/11/2018  WBC 4.0 - 10.5 K/uL 8.4 10.6(H) 11.6(H)  RBC 3.87 - 5.11 MIL/uL 4.35 3.73(L) 3.76(L)  HGB 12.0 - 15.0 g/dL 11.2(L) 9.8(L) 9.8(L)  HCT 36.0 - 46.0 % 36.1 30.8(L) 31.3(L)  PLT 150 - 400 K/uL 310 503(H) 482(H)  NEUTROABS 1.7 - 7.7 K/uL - - -  LYMPHSABS 0.7 - 4.0 K/uL - - -     Body mass index is 80.07 kg/m.  Orders:  No orders of the defined types were placed in this encounter.  No orders of the defined types were placed  in this encounter.    Procedures: No procedures performed  Clinical Data: No additional findings.  ROS:  All other systems negative, except as noted in the HPI. Review of Systems  Objective: Vital Signs: Ht 5' (1.524 m)   Wt (!) 410 lb (186 kg)   LMP  (LMP Unknown)   BMI 80.07 kg/m   Specialty Comments:  No specialty comments available.  PMFS History: Patient Active Problem List   Diagnosis Date Noted  . Gangrene of left foot ([place]) 05/16/2019  . Subacute osteomyelitis of left foot ([place])   . Charcot's joint of foot, left   . Post-operative state   . PAD (peripheral artery disease) ([place]) 12/05/2018  . Osteomyelitis of ankle or foot, acute, left ([place]) 12/05/2018  . MRSA bacteremia 12/05/2018  . Sepsis without acute organ dysfunction ([place])   . Charcot's joint of foot due to diabetes ([place]) 09/18/2018  .  BMI 36.0-36.9,adult   . Morbid obesity ([place]) 11/19/2017  . B12 deficiency 11/01/2017  . Diabetic foot ulcer ([place]) 12/14/2016  . Diabetic polyneuropathy associated with type 2 diabetes mellitus ([place]) 12/13/2015  . Lumbar stenosis with neurogenic claudication 09/02/2015  . Chronic constipation 07/13/2015  . OSA (obstructive sleep apnea) 06/25/2015  . 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/23/2015  . Hypertriglyceridemia 04/23/2015  . Asthma, mild intermittent 02/01/2015  . Type 2 diabetes mellitus with diabetic nephropathy ([place]) 12/31/2014  . Gastroesophageal reflux disease with esophagitis 12/31/2014  . Dysphagia 12/31/2014  . Bilateral carpal tunnel syndrome 12/01/2014  . Diverticulosis of colon (without mention of hemorrhage) 11/30/2012  . Essential hypertension, benign 11/28/2012   Past Medical History:  Diagnosis Date  . Anemia   . Arthritis   . Asthma   . Colon polyps    adenomatous  . Diabetes mellitus without complication ([place])   . Diabetic infection of left foot ([place]) 12/2018  . Diverticulosis of colon   . Esophagitis   . GERD (gastroesophageal reflux disease)   . Hemorrhoid    internal  . Hyperlipemia   . Hypertension   . IBS (irritable bowel syndrome)    no current prob - diet controlled  . Myalgia due to statin 11/19/2017  . Neuropathy   . Neuropathy of both feet   . Neuropathy of hand   . Pneumonia    x 4  . PONV (postoperative nausea and vomiting)    on some surgeries but not all procedures  . Skin ulcer of right ankle, limited to breakdown of skin ([place]) 12/31/2016   resolved per patient 05/14/19  . Sleep apnea    has had in the past lost 50 pounds and do longer uses cpap  . Statin intolerance 04/23/2015  . Stroke [facility]) 2009   mini stroke per patient  . Stroke-like episode 2009   TIA - mini stroke per patient  . 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 ([place]) 07/13/2015  . Uncontrolled type 2 diabetes mellitus with  hyperglycemia ([place]) 11/01/2017    Family History  Problem Relation Age of Onset  . Lung cancer Father   . Hypertension Father   . Arthritis Father   . Other Mother        hardening of the arteries/renal cell carcenoma  . Hypertension Mother   . Stroke Mother   . Kidney cancer Mother   . Arthritis Brother   . Rheum arthritis Maternal Uncle   . Bladder Cancer Neg Hx     Past Surgical History:  Procedure Laterality Date  . ABDOMINAL HYSTERECTOMY    .  AMPUTATION Left 12/06/2018   Procedure: PARTIAL AMPUTATION LEFT FOOT;  Surgeon: Edrick Kins, DPM;  Location: [facility];  Service: Podiatry;  Laterality: Left;  . AMPUTATION Left 05/16/2019   Procedure: LEFT BELOW KNEE AMPUTATION;  Surgeon: Newt Minion, MD;  Location: [facility];  Service: Orthopedics;  Laterality: Left;  . BACK SURGERY  09/02/2015  . BONE BIOPSY Left 12/06/2018   Procedure: Bone Biopsy;  Surgeon: Edrick Kins, DPM;  Location: [facility];  Service: Podiatry;  Laterality: Left;  . CERVICAL FUSION    . CESAREAN SECTION  1986  . COLONOSCOPY N/A 11/30/2012   Procedure: COLONOSCOPY;  Surgeon: Irene Shipper, MD;  Location: [facility] ENDOSCOPY;  Service: Endoscopy;  Laterality: N/A;  . COLONOSCOPY WITH PROPOFOL N/A 01/03/2016   Procedure: COLONOSCOPY WITH PROPOFOL;  Surgeon: Manya Silvas, MD;  [facility];  Service: Endoscopy;  Laterality: N/A;  . ESOPHAGOGASTRODUODENOSCOPY (EGD) WITH PROPOFOL N/A 01/14/2015   Procedure: ESOPHAGOGASTRODUODENOSCOPY (EGD) WITH PROPOFOL;  Surgeon: Manya Silvas, MD;  Location: [facility] ENDOSCOPY;  Service: Endoscopy;  Laterality: N/A;  . IRRIGATION AND DEBRIDEMENT FOOT Left 03/13/2018   Procedure: IRRIGATION AND DEBRIDEMENT FOOT WITH BONE BIOPSY WITH MISONIX DEBRIDER;  Surgeon: Evelina Bucy, DPM;  Location: [facility];  Service: Podiatry;  Laterality: Left;  . IRRIGATION AND DEBRIDEMENT FOOT Left 12/06/2018   Procedure: Irrigation And Debridement Foot;  Surgeon: Edrick Kins, DPM;  Location: [facility];   Service: Podiatry;  Laterality: Left;  . LUMBAR WOUND DEBRIDEMENT N/A 10/01/2015   Procedure: LUMBAR WOUND DEBRIDEMENT;  Surgeon: Ashok Pall, MD;  Location: [facility] NEURO ORS;  Service: Neurosurgery;  Laterality: N/A;  LUMBAR WOUND DEBRIDEMENT  . NASAL SINUS SURGERY    . SAVORY DILATION N/A 01/14/2015   Procedure: SAVORY DILATION;  Surgeon: Manya Silvas, MD;  Location: [facility] ENDOSCOPY;  Service: Endoscopy;  Laterality: N/A;  . TONSILLECTOMY    . WISDOM TOOTH EXTRACTION     Social History   Occupational History  . Occupation: histology Engineer, production: [employer]  Tobacco Use  . Smoking status: Never Smoker  . Smokeless tobacco: Never Used  Substance and Sexual Activity  . Alcohol use: Never  . Drug use: No  . Sexual activity: Not Currently    Comment: Hysterectomy

## 2019-07-18 NOTE — Telephone Encounter (Signed)
Patient is worried about paying for PT . We discussed 3 options. Going to PT 1-2x per month to get a [place] or [place]. She is going to check with her insurance company to see what works best for her and call us back

## 2019-07-22 ENCOUNTER — Ambulatory Visit (INDEPENDENT_AMBULATORY_CARE_PROVIDER_SITE_OTHER): Payer: Medicare Other | Admitting: Podiatry

## 2019-07-22 ENCOUNTER — Other Ambulatory Visit: Payer: Self-pay

## 2019-07-22 DIAGNOSIS — M21171 Varus deformity, not elsewhere classified, right ankle: Secondary | ICD-10-CM

## 2019-07-22 DIAGNOSIS — S93401A Sprain of unspecified ligament of right ankle, initial encounter: Secondary | ICD-10-CM

## 2019-07-24 ENCOUNTER — Encounter: Payer: Self-pay | Admitting: Family Medicine

## 2019-07-24 ENCOUNTER — Other Ambulatory Visit: Payer: Self-pay

## 2019-07-24 ENCOUNTER — Ambulatory Visit (INDEPENDENT_AMBULATORY_CARE_PROVIDER_SITE_OTHER): Payer: Self-pay | Admitting: Family Medicine

## 2019-07-24 DIAGNOSIS — K2100 Gastro-esophageal reflux disease with esophagitis, without bleeding: Secondary | ICD-10-CM

## 2019-07-24 DIAGNOSIS — E781 Pure hyperglyceridemia: Secondary | ICD-10-CM

## 2019-07-24 DIAGNOSIS — Z89512 Acquired absence of left leg below knee: Secondary | ICD-10-CM

## 2019-07-24 DIAGNOSIS — Z09 Encounter for follow-up examination after completed treatment for conditions other than malignant neoplasm: Secondary | ICD-10-CM

## 2019-07-24 DIAGNOSIS — E1142 Type 2 diabetes mellitus with diabetic polyneuropathy: Secondary | ICD-10-CM

## 2019-07-24 MED ORDER — HYDROCHLOROTHIAZIDE 12.5 MG PO TABS: 12.5000 mg | ORAL_TABLET | Freq: Every day | ORAL | 3 refills | Status: DC

## 2019-07-24 MED ORDER — FENOFIBRATE 145 MG PO TABS: 145.0000 mg | ORAL_TABLET | Freq: Every day | ORAL | 3 refills | Status: DC

## 2019-07-24 MED ORDER — PANTOPRAZOLE SODIUM 40 MG PO TBEC: 40.0000 mg | DELAYED_RELEASE_TABLET | Freq: Every day | ORAL | 2 refills | Status: DC | PRN

## 2019-07-24 NOTE — Progress Notes (Signed)
Name: Olivia Romero   MRN: [id number]    DOB: Oct 15, 1955   Date:07/24/2019       Progress Note  Subjective:    Chief Complaint  Chief Complaint  Patient presents with  . Form    Handicap form completion due   . Medication Refill    Pantorazole    I connected with  [name] on 07/24/19 at 11:00 AM EST by telephone and verified that I am speaking with the correct person using two identifiers.   I discussed the limitations, risks, security and privacy concerns of performing an evaluation and management service by telephone and the availability of in person appointments. Staff also discussed with the patient that there may be a patient responsible charge related to this service. Patient Location:  home Provider Location: [facility]  Additional Individuals present: none  HPI Pt presents for surgery f/up -  05/16/2019 she had left BKA had been in rehab in [place] [facility] She said she never did PCP f/up like she was supposed to.  Her PCP is Raelyn Ensign who is out for [facility] currently, pt new to me.  Hospitalization 12/11-12/16 and then went to the [facility] rehab 05/21/2019 and she was supposed to discharge home about new years day but she did contract COVID while she was there for her rehab - she was tested neg at the hospital, tested neg going into rehab and she and 11 others in her area later tested positive for COVID, that delayed her going home for another week or so.  She has been home for more than a month, she has a temp handicap placard but it is about to expire.  She has seen ortho for f/up (had some wound healing issues/dehis/abscess) and she has seen endo and podiatry as well for her f/ups   She presents today to ask for handicap form to be completed and also needs several meds refilled.  She overall is feeling well and doing well, she recovered from COVID w/o any lingering or prolonged sx.  Needs refill on HCTZ.  She states BP at home have been around 125/70 Her BP goes  lower with pain meds when in hospital, but she has not had any lightheadedness, near syncope or syncopal episodes, dizziness. No hypotension at home.  Hx of HLD and high trigs, she needs a refill on Tricor which she takes daily, she is about to run out.  She denies any SE, sx or concerns.  I reviewed her most recent labs in this EMR and through care everywhere with labs 2/17 with lipid panel and CMP from [facility]o visit at [facility] high 413, but she did eat prior to labs, before that trigs were ~200.  No hx of pancreatitis DM managed by endo - she asks what her A1C was, A1C up just a little bit 7.4, was 6.9 before  GERD:  She takes protonix PRN on occasionally has sx, and meds are effective, but she doesn't need it every day.  No hx of PUD, gastric ulcer, GIB, she denies indigestion, bloating, reflux abd pain, melena, hematochezia.  Patient Active Problem List   Diagnosis Date Noted  . Gangrene of left foot ([place]) 05/16/2019  . Subacute osteomyelitis of left foot ([place])   . Charcot's joint of foot, left   . Post-operative state   . PAD (peripheral artery disease) ([place]) 12/05/2018  . Osteomyelitis of ankle or foot, acute, left ([place]) 12/05/2018  . MRSA bacteremia 12/05/2018  . Sepsis without  acute organ dysfunction ([place])   . Charcot's joint of foot due to diabetes ([place]) 09/18/2018  . BMI 36.0-36.9,adult   . Morbid obesity ([place]) 11/19/2017  . B12 deficiency 11/01/2017  . Diabetic foot ulcer ([place]) 12/14/2016  . Diabetic polyneuropathy associated with type 2 diabetes mellitus ([place]) 12/13/2015  . Lumbar stenosis with neurogenic claudication 09/02/2015  . Chronic constipation 07/13/2015  . OSA (obstructive sleep apnea) 06/25/2015  . 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/23/2015  . Hypertriglyceridemia 04/23/2015  . Asthma, mild intermittent 02/01/2015  . Type 2 diabetes mellitus with diabetic nephropathy ([place]) 12/31/2014  . Gastroesophageal reflux disease with esophagitis  12/31/2014  . Dysphagia 12/31/2014  . Bilateral carpal tunnel syndrome 12/01/2014  . Diverticulosis of colon (without mention of hemorrhage) 11/30/2012  . Essential hypertension, benign 11/28/2012    Social History   Tobacco Use  . Smoking status: Never Smoker  . Smokeless tobacco: Never Used  Substance Use Topics  . Alcohol use: Never     Current Outpatient Medications:  .  acetaminophen (TYLENOL) 500 MG tablet, Take 1,000 mg by mouth every 6 (six) hours as needed for mild pain. Maximum of 3,000 mg per day, Disp: , Rfl:  .  Albuterol Sulfate (PROAIR RESPICLICK) 938 (90 Base) MCG/ACT AEPB, Inhale 1-2 puffs into the lungs every 6 (six) hours as needed (Shortness of breath or wheezing)., Disp: 1 each, Rfl: 2 .  aspirin EC 325 MG tablet, Take 1 tablet (325 mg total) by mouth daily., Disp: 30 tablet, Rfl: 0 .  Blood Glucose Monitoring Suppl (CONTOUR NEXT MONITOR) w/Device KIT, as directed. , Disp: , Rfl:  .  Chlorphen-Phenyleph-ASA (ALKA-SELTZER PLUS COLD PO), Take 2 tablets by mouth as needed (allergies). Day and night time, Disp: , Rfl:  .  DULoxetine (CYMBALTA) 60 MG capsule, Take 60 mg by mouth at bedtime., Disp: , Rfl:  .  fenofibrate (TRICOR) 145 MG tablet, Take 145 mg by mouth daily., Disp: , Rfl: 10 .  gabapentin (NEURONTIN) 400 MG capsule, Take 800 mg by mouth 3 (three) times daily. , Disp: , Rfl:  .  glimepiride (AMARYL) 2 MG tablet, Take 2 mg by mouth 2 (two) times daily. , Disp: , Rfl:  .  hydrochlorothiazide (HYDRODIURIL) 12.5 MG tablet, Take 12.5 mg by mouth daily., Disp: , Rfl:  .  Insulin Degludec (TRESIBA FLEXTOUCH) 200 UNIT/ML SOPN, Inject 20 Units into the skin at bedtime. , Disp: , Rfl:  .  metFORMIN (GLUCOPHAGE-XR) 500 MG 24 hr tablet, Take 500 mg by mouth 3 (three) times daily. , Disp: , Rfl:  .  pantoprazole (PROTONIX) 40 MG tablet, Take 1 tablet (40 mg total) by mouth daily. (Patient taking differently: Take 40 mg by mouth daily as needed (Heartburn). ), Disp: 90  tablet, Rfl: 1 .  ULTICARE SHORT PEN NEEDLES 31G X 8 MM MISC, , Disp: , Rfl:  .  oxyCODONE (OXY IR/ROXICODONE) 5 MG immediate release tablet, Take 1-2 tablets (5-10 mg total) by mouth every 4 (four) hours as needed for moderate pain (pain score 4-6). (Patient not taking: Reported on 07/24/2019), Disp: 30 tablet, Rfl: 0  Allergies  Allergen Reactions  . Eggs Or Egg-Derived Products Swelling and Other (See Comments)    Angioedema  . Atorvastatin Other (See Comments)    Liver toxicity  . Flu Virus Vaccine Swelling    Arm swelled (site of injection)  . Latex Itching  . Pravastatin Itching and Rash  . Tape Rash and Other (See Comments)    Adhesive on foot  pull off skin.  Ok to use paper tape.    Chart Review: Extensive chart review as documented in EMR and HPI  Review of Systems 10 Systems reviewed and are negative for acute change except as noted in the HPI.   Objective:    Virtual encounter, vitals limited, only able to obtain the following There were no vitals filed for this visit. There is no height or weight on file to calculate BMI. Nursing Note and Vital Signs reviewed.  Physical Exam Pt alert, pleasant, phonation clear, talkative PE limited by telephone encounter  No results found for this or any previous visit (from the past 72 hour(s)).  Assessment and Plan:     ICD-10-CM   1. S/P BKA (below knee amputation), left [facility])  Z89.512    forms to be done in office tomorrow and pt's family member will pick up  2. Gastroesophageal reflux disease with esophagitis  K21.00 pantoprazole (PROTONIX) 40 MG tablet   well controlled with PRN use of protonix, refill sent in  3. Diabetic polyneuropathy associated with type 2 diabetes mellitus (HCC)  E11.42    per endo  4. Hypertriglyceridemia  E78.1    med refill, labs reviewed, last trig 41 - not fasting, encouraged fasting labs next time, LFTs normal, no SE or concerns,  5. Encounter for examination following treatment at  hospital  Z09    reviewed her hospitalizations, rehab, labs, VS, recent COVID, pt doing well    -Red flags and when to present for emergency care or RTC including but not limited to new/worsening/un-resolving symptoms,  reviewed with patient at time of visit. Follow up and care instructions discussed and provided in AVS. - I discussed the assessment and treatment plan with the patient. The patient was provided an opportunity to ask questions and all were answered. The patient agreed with the plan and demonstrated an understanding of the instructions.  - The patient was advised to call back or seek an in-person evaluation if the symptoms worsen or if the condition fails to improve as anticipated.  I provided 21 minutes of non-face-to-face time during this encounter.  Delsa Grana, PA-C 07/24/19 11:19 AM

## 2019-07-28 NOTE — Progress Notes (Signed)
Subjective: 64 year old female presents the office today for follow-up evaluation of right ankle sprain.  She said that she is doing much better and she is currently not experiencing any discomfort.  No swelling.  She is awaiting new brace for the right side.  She is been wearing the Tri-Lock ankle brace for now.  She has no new concerns. Denies any systemic complaints such as fevers, chills, nausea, vomiting. No acute changes since last appointment, and no other complaints at this time.   Objective: AAO x3, NAD DP/PT pulses palpable bilaterally, CRT less than 3 seconds Left below-knee amputation Flexible varus deformity present to right ankle.  There is no area of tenderness identified today there is no gross ankle instability present.  Flexor, extensor tendons appear to be intact. No open lesions or pre-ulcerative lesions.  No pain with calf compression, swelling, warmth, erythema  Assessment: Ankle sprain right side with flexible varus  Plan: -All treatment options discussed with the patient including all alternatives, risks, complications.  -Overall she is doing much better.  I want her to continue with a Tri-Lock ankle brace for now for stability.  Awaiting new custom brace for the right side. Discussed home rehab exercises. Unable to do formal PT right now due to cost.  -Patient encouraged to call the office with any questions, concerns, change in symptoms.   Trula Slade DPM

## 2019-07-29 ENCOUNTER — Other Ambulatory Visit: Payer: Self-pay

## 2019-07-29 ENCOUNTER — Ambulatory Visit (INDEPENDENT_AMBULATORY_CARE_PROVIDER_SITE_OTHER): Payer: Medicare Other | Admitting: Orthotics

## 2019-07-29 DIAGNOSIS — M21171 Varus deformity, not elsewhere classified, right ankle: Secondary | ICD-10-CM | POA: Diagnosis not present

## 2019-07-29 DIAGNOSIS — S93401A Sprain of unspecified ligament of right ankle, initial encounter: Secondary | ICD-10-CM

## 2019-07-29 DIAGNOSIS — M21541 Acquired clubfoot, right foot: Secondary | ICD-10-CM

## 2019-07-29 DIAGNOSIS — M14671 Charcot's joint, right ankle and foot: Secondary | ICD-10-CM

## 2019-07-29 NOTE — Progress Notes (Signed)
Patient came in today to pick up standard Afo brace.  Patient was evaluated for fit and function.   The brace fit very well and there were any complaints of the way it felt once donned.  The brace offered ankle stability in both saggital and coroneal planes.  Patient advised to always wear proper fitting shoes with brace. 

## 2019-07-30 DIAGNOSIS — Z89512 Acquired absence of left leg below knee: Secondary | ICD-10-CM | POA: Insufficient documentation

## 2019-08-11 ENCOUNTER — Ambulatory Visit: Payer: Medicare Other | Admitting: Podiatry

## 2019-08-13 LAB — HM DIABETES EYE EXAM

## 2019-08-14 ENCOUNTER — Ambulatory Visit (INDEPENDENT_AMBULATORY_CARE_PROVIDER_SITE_OTHER): Payer: Medicare Other | Admitting: Physician Assistant

## 2019-08-14 ENCOUNTER — Other Ambulatory Visit: Payer: Self-pay

## 2019-08-14 ENCOUNTER — Encounter: Payer: Self-pay | Admitting: Physician Assistant

## 2019-08-14 VITALS — Ht 60.0 in | Wt >= 6400 oz

## 2019-08-14 DIAGNOSIS — Z89512 Acquired absence of left leg below knee: Secondary | ICD-10-CM

## 2019-08-14 NOTE — Progress Notes (Signed)
Office Visit Note   Patient: Olivia Romero           Date of Birth: [birth year]           MRN: [id number] Visit Date: 08/14/2019              Requested by: [name], [place],  [place] [id number] PCP: [name], FNP  Chief Complaint  Patient presents with  . Left Leg - Routine Post Op    05/16/19 left BKA       HPI: This is a pleasant 64 year old woman who is 3 months status post left below-knee amputation she is doing very well.  She has had some difficulties with suture abscesses.  She initially had 1 on the medial aspect of the wound she now noticed a small one on the lateral side of the wound which she did try to pick out herself.  She is getting fitted for her prosthetic  Assessment & Plan: Visit Diagnoses:  1. History of left below knee amputation ([place])     Plan: She should obtain a smaller shrinker sock 2xl.  I have also given her a prescription for physical therapy.  After obtaining verbal consent the suture was removed.  There is no drainage no surrounding erythema Follow-Up Instructions: No follow-ups on file.   Ortho Exam  Patient is alert, oriented, no adenopathy, well-dressed, normal affect, normal respiratory effort. Below-knee amputation stump is completely healed.  There was one very small area of retained suture on the lateral side but did not have any drainage or surrounding erythema.  This was removed.  Her swelling is well controlled wound edges are healed and healthy  Imaging: No results found.   Labs: Lab Results  Component Value Date   HGBA1C 7.3 (H) 05/16/2019   HGBA1C 7.9 09/17/2018   HGBA1C 7.0 05/08/2018   ESRSEDRATE 60 (H) 12/05/2018   ESRSEDRATE 41 (H) 12/04/2018   ESRSEDRATE 30 07/12/2018   CRP 22.0 (H) 12/05/2018   CRP 20 (H) 12/04/2018   CRP 6 07/12/2018   LABURIC 8.2 (H) 11/30/2015   REPTSTATUS 12/12/2018 FINAL 12/06/2018   GRAMSTAIN  12/06/2018    RARE WBC PRESENT, PREDOMINANTLY PMN NO  ORGANISMS SEEN    CULT  12/06/2018    RARE METHICILLIN RESISTANT STAPHYLOCOCCUS AUREUS WITH NORMAL SKIN FLORA NO ANAEROBES ISOLATED Performed at [facility], [place] [address], [place], [place] [address]    LABORGA METHICILLIN RESISTANT STAPHYLOCOCCUS AUREUS 12/06/2018     Lab Results  Component Value Date   ALBUMIN 3.7 12/04/2018   ALBUMIN 4.0 11/27/2018   ALBUMIN 3.8 03/11/2018   PREALBUMIN 21.5 03/12/2018   LABURIC 8.2 (H) 11/30/2015    No results found for: MG Lab Results  Component Value Date   VD25OH 11 (L) 09/20/2016    Lab Results  Component Value Date   PREALBUMIN 21.5 03/12/2018   CBC EXTENDED Latest Ref Rng & Units 05/16/2019 12/11/2018 12/11/2018  WBC 4.0 - 10.5 K/uL 8.4 10.6(H) 11.6(H)  RBC 3.87 - 5.11 MIL/uL 4.35 3.73(L) 3.76(L)  HGB 12.0 - 15.0 g/dL 11.2(L) 9.8(L) 9.8(L)  HCT 36.0 - 46.0 % 36.1 30.8(L) 31.3(L)  PLT 150 - 400 K/uL 310 503(H) 482(H)  NEUTROABS 1.7 - 7.7 K/uL - - -  LYMPHSABS 0.7 - 4.0 K/uL - - -     Body mass index is 80.07 kg/m.  Orders:  Orders Placed This Encounter  Procedures  . Ambulatory referral to Physical  Therapy   No orders of the defined types were placed in this encounter.    Procedures: No procedures performed  Clinical Data: No additional findings.  ROS:  All other systems negative, except as noted in the HPI. Review of Systems  Objective: Vital Signs: Ht 5' (1.524 m)   Wt (!) 410 lb (186 kg)   LMP  (LMP Unknown)   BMI 80.07 kg/m   Specialty Comments:  No specialty comments available.  PMFS History: Patient Active Problem List   Diagnosis Date Noted  . Gangrene of left foot ([place]) 05/16/2019  . Subacute osteomyelitis of left foot ([place])   . Charcot's joint of foot, left   . Post-operative state   . PAD (peripheral artery disease) ([place]) 12/05/2018  . Osteomyelitis of ankle or foot, acute, left ([place]) 12/05/2018  . MRSA bacteremia 12/05/2018  . Sepsis without acute organ dysfunction ([place])   .  Charcot's joint of foot due to diabetes ([place]) 09/18/2018  . BMI 36.0-36.9,adult   . Morbid obesity ([place]) 11/19/2017  . B12 deficiency 11/01/2017  . Diabetic foot ulcer ([place]) 12/14/2016  . Diabetic polyneuropathy associated with type 2 diabetes mellitus ([place]) 12/13/2015  . Lumbar stenosis with neurogenic claudication 09/02/2015  . Chronic constipation 07/13/2015  . OSA (obstructive sleep apnea) 06/25/2015  . 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/23/2015  . Hypertriglyceridemia 04/23/2015  . Asthma, mild intermittent 02/01/2015  . Type 2 diabetes mellitus with diabetic nephropathy ([place]) 12/31/2014  . Gastroesophageal reflux disease with esophagitis 12/31/2014  . Dysphagia 12/31/2014  . Bilateral carpal tunnel syndrome 12/01/2014  . Diverticulosis of colon (without mention of hemorrhage) 11/30/2012  . Essential hypertension, benign 11/28/2012   Past Medical History:  Diagnosis Date  . Anemia   . Arthritis   . Asthma   . Colon polyps    adenomatous  . Diabetes mellitus without complication ([place])   . Diabetic infection of left foot ([place]) 12/2018  . Diverticulosis of colon   . Esophagitis   . GERD (gastroesophageal reflux disease)   . Hemorrhoid    internal  . Hyperlipemia   . Hypertension   . IBS (irritable bowel syndrome)    no current prob - diet controlled  . Myalgia due to statin 11/19/2017  . Neuropathy   . Neuropathy of both feet   . Neuropathy of hand   . Pneumonia    x 4  . PONV (postoperative nausea and vomiting)    on some surgeries but not all procedures  . Skin ulcer of right ankle, limited to breakdown of skin ([place]) 12/31/2016   resolved per patient 05/14/19  . Sleep apnea    has had in the past lost 50 pounds and do longer uses cpap  . Statin intolerance 04/23/2015  . Stroke [facility]) 2009   mini stroke per patient  . Stroke-like episode 2009   TIA - mini stroke per patient  . 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 ([place])  07/13/2015  . Uncontrolled type 2 diabetes mellitus with hyperglycemia ([place]) 11/01/2017    Family History  Problem Relation Age of Onset  . Lung cancer Father   . Hypertension Father   . Arthritis Father   . Other Mother        hardening of the arteries/renal cell carcenoma  . Hypertension Mother   . Stroke Mother   . Kidney cancer Mother   . Arthritis Brother   . Rheum arthritis Maternal Uncle   . Bladder Cancer Neg Hx  Past Surgical History:  Procedure Laterality Date  . ABDOMINAL HYSTERECTOMY    . AMPUTATION Left 12/06/2018   Procedure: PARTIAL AMPUTATION LEFT FOOT;  Surgeon: Edrick Kins, DPM;  Location: [facility];  Service: Podiatry;  Laterality: Left;  . AMPUTATION Left 05/16/2019   Procedure: LEFT BELOW KNEE AMPUTATION;  Surgeon: Newt Minion, MD;  Location: [facility];  Service: Orthopedics;  Laterality: Left;  . BACK SURGERY  09/02/2015  . BONE BIOPSY Left 12/06/2018   Procedure: Bone Biopsy;  Surgeon: Edrick Kins, DPM;  Location: [facility];  Service: Podiatry;  Laterality: Left;  . CERVICAL FUSION    . CESAREAN SECTION  1986  . COLONOSCOPY N/A 11/30/2012   Procedure: COLONOSCOPY;  Surgeon: Irene Shipper, MD;  Location: [facility];  Service: Endoscopy;  Laterality: N/A;  . COLONOSCOPY WITH PROPOFOL N/A 01/03/2016   Procedure: COLONOSCOPY WITH PROPOFOL;  Surgeon: Manya Silvas, MD;  Location[facility];  Service: Endoscopy;  Laterality: N/A;  . ESOPHAGOGASTRODUODENOSCOPY (EGD) WITH PROPOFOL N/A 01/14/2015   Procedure: ESOPHAGOGASTRODUODENOSCOPY (EGD) WITH PROPOFOL;  Surgeon: Manya Silvas, MD;  Location: [facility] ENDOSCOPY;  Service: Endoscopy;  Laterality: N/A;  . IRRIGATION AND DEBRIDEMENT FOOT Left 03/13/2018   Procedure: IRRIGATION AND DEBRIDEMENT FOOT WITH BONE BIOPSY WITH MISONIX DEBRIDER;  Surgeon: Evelina Bucy, DPM;  Location: [facility];  Service: Podiatry;  Laterality: Left;  . IRRIGATION AND DEBRIDEMENT FOOT Left 12/06/2018   Procedure: Irrigation And Debridement Foot;   Surgeon: Edrick Kins, DPM;  Location: [facility];  Service: Podiatry;  Laterality: Left;  . LEG AMPUTATION BELOW KNEE Left   . LUMBAR WOUND DEBRIDEMENT N/A 10/01/2015   Procedure: LUMBAR WOUND DEBRIDEMENT;  Surgeon: Ashok Pall, MD;  Location: [facility];  Service: Neurosurgery;  Laterality: N/A;  LUMBAR WOUND DEBRIDEMENT  . NASAL SINUS SURGERY    . SAVORY DILATION N/A 01/14/2015   Procedure: SAVORY DILATION;  Surgeon: Manya Silvas, MD;  Location: [facility] ENDOSCOPY;  Service: Endoscopy;  Laterality: N/A;  . TONSILLECTOMY    . WISDOM TOOTH EXTRACTION     Social History   Occupational History  . Occupation: histology Engineer, production: [employer]  Tobacco Use  . Smoking status: Never Smoker  . Smokeless tobacco: Never Used  Substance and Sexual Activity  . Alcohol use: Never  . Drug use: No  . Sexual activity: Not Currently    Comment: Hysterectomy

## 2019-08-21 ENCOUNTER — Ambulatory Visit (INDEPENDENT_AMBULATORY_CARE_PROVIDER_SITE_OTHER): Payer: Medicare Other

## 2019-08-21 VITALS — BP 116/62 | Ht 60.0 in | Wt 187.0 lb

## 2019-08-21 DIAGNOSIS — Z1231 Encounter for screening mammogram for malignant neoplasm of breast: Secondary | ICD-10-CM

## 2019-08-21 DIAGNOSIS — Z0000 Encounter for general adult medical examination without abnormal findings: Secondary | ICD-10-CM

## 2019-08-21 NOTE — Patient Instructions (Signed)
Olivia Romero , Thank you for taking time to come for your Medicare Wellness Visit. I appreciate your ongoing commitment to your health goals. Please review the following plan we discussed and let me know if I can assist you in the future.   Screening recommendations/referrals: Colonoscopy: done 01/03/16 Mammogram: done 10/17/16. Please call [phone] to schedule your mammogram.  Bone Density: due age 64 Recommended yearly ophthalmology/optometry visit for glaucoma screening and checkup Recommended yearly dental visit for hygiene and checkup  Vaccinations: Influenza vaccine: n/a Pneumococcal vaccine: done 01/20/14 Tdap vaccine: done 07/13/15 Shingles vaccine: Shingrix discussed. Please contact your pharmacy for coverage information.  Advanced directives: Advance directive discussed with you today. I have provided a copy for you to complete at home and have notarized. Once this is complete please bring a copy in to our office so we can scan it into your chart.  Conditions/risks identified: Recommend preventing falls by continuing to increase strength and stability with physical therapy.   Next appointment: Please follow up in one year for your Medicare Annual Wellness visit.    Preventive Care 40-64 Years, Female Preventive care refers to lifestyle choices and visits with your health care provider that can promote health and wellness. What does preventive care include?  A yearly physical exam. This is also called an annual well check.  Dental exams once or twice a year.  Routine eye exams. Ask your health care provider how often you should have your eyes checked.  Personal lifestyle choices, including:  Daily care of your teeth and gums.  Regular physical activity.  Eating a healthy diet.  Avoiding tobacco and drug use.  Limiting alcohol use.  Practicing safe sex.  Taking low-dose aspirin daily starting at age 34.  Taking vitamin and mineral supplements as recommended by  your health care provider. What happens during an annual well check? The services and screenings done by your health care provider during your annual well check will depend on your age, overall health, lifestyle risk factors, and family history of disease. Counseling  Your health care provider may ask you questions about your:  Alcohol use.  Tobacco use.  Drug use.  Emotional well-being.  Home and relationship well-being.  Sexual activity.  Eating habits.  Work and work Statistician.  Method of birth control.  Menstrual cycle.  Pregnancy history. Screening  You may have the following tests or measurements:  Height, weight, and BMI.  Blood pressure.  Lipid and cholesterol levels. These may be checked every 5 years, or more frequently if you are over 51 years old.  Skin check.  Lung cancer screening. You may have this screening every year starting at age 57 if you have a 30-pack-year history of smoking and currently smoke or have quit within the past 15 years.  Fecal occult blood test (FOBT) of the stool. You may have this test every year starting at age 55.  Flexible sigmoidoscopy or colonoscopy. You may have a sigmoidoscopy every 5 years or a colonoscopy every 10 years starting at age 28.  Hepatitis C blood test.  Hepatitis B blood test.  Sexually transmitted disease (STD) testing.  Diabetes screening. This is done by checking your blood sugar (glucose) after you have not eaten for a while (fasting). You may have this done every 1-3 years.  Mammogram. This may be done every 1-2 years. Talk to your health care provider about when you should start having regular mammograms. This may depend on whether you have a family history of breast cancer.  BRCA-related cancer screening. This may be done if you have a family history of breast, ovarian, tubal, or peritoneal cancers.  Pelvic exam and Pap test. This may be done every 3 years starting at age 73. Starting at age  31, this may be done every 5 years if you have a Pap test in combination with an HPV test.  Bone density scan. This is done to screen for osteoporosis. You may have this scan if you are at high risk for osteoporosis. Discuss your test results, treatment options, and if necessary, the need for more tests with your health care provider. Vaccines  Your health care provider may recommend certain vaccines, such as:  Influenza vaccine. This is recommended every year.  Tetanus, diphtheria, and acellular pertussis (Tdap, Td) vaccine. You may need a Td booster every 10 years.  Zoster vaccine. You may need this after age 54.  Pneumococcal 13-valent conjugate (PCV13) vaccine. You may need this if you have certain conditions and were not previously vaccinated.  Pneumococcal polysaccharide (PPSV23) vaccine. You may need one or two doses if you smoke cigarettes or if you have certain conditions. Talk to your health care provider about which screenings and vaccines you need and how often you need them. This information is not intended to replace advice given to you by your health care provider. Make sure you discuss any questions you have with your health care provider. Document Released: 06/18/2015 Document Revised: 02/09/2016 Document Reviewed: 03/23/2015 Elsevier Interactive Patient Education  2017 [place] Prevention in the Home Falls can cause injuries. They can happen to people of all ages. There are many things you can do to make your home safe and to help prevent falls. What can I do on the outside of my home?  Regularly fix the edges of walkways and driveways and fix any cracks.  Remove anything that might make you trip as you walk through a door, such as a raised step or threshold.  Trim any bushes or trees on the path to your home.  Use bright outdoor lighting.  Clear any walking paths of anything that might make someone trip, such as rocks or tools.  Regularly check to  see if handrails are loose or broken. Make sure that both sides of any steps have handrails.  Any raised decks and porches should have guardrails on the edges.  Have any leaves, snow, or ice cleared regularly.  Use sand or salt on walking paths during winter.  Clean up any spills in your garage right away. This includes oil or grease spills. What can I do in the bathroom?  Use night lights.  Install grab bars by the toilet and in the tub and shower. Do not use towel bars as grab bars.  Use non-skid mats or decals in the tub or shower.  If you need to sit down in the shower, use a plastic, non-slip stool.  Keep the floor dry. Clean up any water that spills on the floor as soon as it happens.  Remove soap buildup in the tub or shower regularly.  Attach bath mats securely with double-sided non-slip rug tape.  Do not have throw rugs and other things on the floor that can make you trip. What can I do in the bedroom?  Use night lights.  Make sure that you have a light by your bed that is easy to reach.  Do not use any sheets or blankets that are too big for your bed. They  should not hang down onto the floor.  Have a firm chair that has side arms. You can use this for support while you get dressed.  Do not have throw rugs and other things on the floor that can make you trip. What can I do in the kitchen?  Clean up any spills right away.  Avoid walking on wet floors.  Keep items that you use a lot in easy-to-reach places.  If you need to reach something above you, use a strong step stool that has a grab bar.  Keep electrical cords out of the way.  Do not use floor polish or wax that makes floors slippery. If you must use wax, use non-skid floor wax.  Do not have throw rugs and other things on the floor that can make you trip. What can I do with my stairs?  Do not leave any items on the stairs.  Make sure that there are handrails on both sides of the stairs and use  them. Fix handrails that are broken or loose. Make sure that handrails are as long as the stairways.  Check any carpeting to make sure that it is firmly attached to the stairs. Fix any carpet that is loose or worn.  Avoid having throw rugs at the top or bottom of the stairs. If you do have throw rugs, attach them to the floor with carpet tape.  Make sure that you have a light switch at the top of the stairs and the bottom of the stairs. If you do not have them, ask someone to add them for you. What else can I do to help prevent falls?  Wear shoes that:  Do not have high heels.  Have rubber bottoms.  Are comfortable and fit you well.  Are closed at the toe. Do not wear sandals.  If you use a stepladder:  Make sure that it is fully opened. Do not climb a closed stepladder.  Make sure that both sides of the stepladder are locked into place.  Ask someone to hold it for you, if possible.  Clearly mark and make sure that you can see:  Any grab bars or handrails.  First and last steps.  Where the edge of each step is.  Use tools that help you move around (mobility aids) if they are needed. These include:  Canes.  Walkers.  Scooters.  Crutches.  Turn on the lights when you go into a dark area. Replace any light bulbs as soon as they burn out.  Set up your furniture so you have a clear path. Avoid moving your furniture around.  If any of your floors are uneven, fix them.  If there are any pets around you, be aware of where they are.  Review your medicines with your doctor. Some medicines can make you feel dizzy. This can increase your chance of falling. Ask your doctor what other things that you can do to help prevent falls. This information is not intended to replace advice given to you by your health care provider. Make sure you discuss any questions you have with your health care provider. Document Released: 03/18/2009 Document Revised: 10/28/2015 Document Reviewed:  06/26/2014 Elsevier Interactive Patient Education  2017 [employer].

## 2019-08-21 NOTE — Progress Notes (Signed)
Subjective:   Olivia Romero is a 64 y.o. female who presents for an Initial Medicare Annual Wellness Visit.  Virtual Visit via Telephone Note  I connected with [name] on 08/21/19 at  8:40 AM EDT by telephone and verified that I am speaking with the correct person using two identifiers.  Medicare Annual Wellness visit completed telephonically due to Covid-19 pandemic.   Location: Patient: home Provider: office   I discussed the limitations, risks, security and privacy concerns of performing an evaluation and management service by telephone and the availability of in person appointments. The patient expressed understanding and agreed to proceed.  Some vital signs may be absent or patient reported.   Clemetine Marker, LPN    Review of Systems    Cardiac Risk Factors include: advanced age (>9mn, >>64women);diabetes mellitus;dyslipidemia;hypertension;obesity (BMI >30kg/m2);sedentary lifestyle     Objective:    Today's Vitals   08/21/19 0847  BP: 116/62  Weight: 187 lb (84.8 kg)  Height: 5' (1.524 m)   Body mass index is 36.52 kg/m.  Advanced Directives 08/21/2019 05/16/2019 12/04/2018 11/27/2018 03/11/2018 01/31/2017 01/19/2017  Does Patient Have a Medical Advance Directive? _0  No No  Type of Advance Directive - - - - - - -  Does patient want to make changes to medical advance directive? - - - - - - -  Copy of HBethanyin Chart? - - - - - - -  Would patient like information on creating a medical advance directive? Yes (MAU/Ambulatory/Procedural Areas - Information given) No - Patient declined Yes (Inpatient - patient defers creating a medical advance directive at this time - Information given) - No - Patient declined - -  Pre-existing out of facility DNR order (yellow form or pink MOST form) - - - - - - -    Current Medications (verified) Outpatient Encounter Medications as of 08/21/2019  Medication Sig  . acetaminophen (TYLENOL) 500  MG tablet Take 1,000 mg by mouth every 6 (six) hours as needed for mild pain. Maximum of 3,000 mg per day  . Albuterol Sulfate (PROAIR RESPICLICK) 1546(90 Base) MCG/ACT AEPB Inhale 1-2 puffs into the lungs every 6 (six) hours as needed (Shortness of breath or wheezing).  .Marland Kitchenaspirin EC 325 MG tablet Take 1 tablet (325 mg total) by mouth daily.  . Blood Glucose Monitoring Suppl (CONTOUR NEXT MONITOR) w/Device KIT as directed.   . Chlorphen-Phenyleph-ASA (ALKA-SELTZER PLUS COLD PO) Take 2 tablets by mouth as needed (allergies). Day and night time  . DULoxetine (CYMBALTA) 60 MG capsule Take 60 mg by mouth at bedtime.  . fenofibrate (TRICOR) 145 MG tablet Take 1 tablet (145 mg total) by mouth daily.  .Marland Kitchengabapentin (NEURONTIN) 400 MG capsule Take 800 mg by mouth 3 (three) times daily.   .Marland Kitchenglimepiride (AMARYL) 2 MG tablet Take 2 mg by mouth 2 (two) times daily.   . hydrochlorothiazide (HYDRODIURIL) 12.5 MG tablet Take 1 tablet (12.5 mg total) by mouth daily.  . Insulin Degludec (TRESIBA FLEXTOUCH) 200 UNIT/ML SOPN Inject 20 Units into the skin at bedtime.   . metFORMIN (GLUCOPHAGE-XR) 500 MG 24 hr tablet Take 500 mg by mouth 3 (three) times daily.   . pantoprazole (PROTONIX) 40 MG tablet Take 1 tablet (40 mg total) by mouth daily as needed (Heartburn).  .Marland KitchenULTICARE SHORT PEN NEEDLES 31G X 8 MM MISC   . [DISCONTINUED] oxyCODONE (OXY IR/ROXICODONE) 5 MG immediate release tablet Take 1-2 tablets (5-10  mg total) by mouth every 4 (four) hours as needed for moderate pain (pain score 4-6).   No facility-administered encounter medications on file as of 08/21/2019.    Allergies (verified) Eggs or egg-derived products, Atorvastatin, Flu virus vaccine, Latex, Pravastatin, and Tape   History: Past Medical History:  Diagnosis Date  . Anemia   . Arthritis   . Asthma   . Colon polyps    adenomatous  . Diabetes mellitus without complication ([place])   . Diabetic infection of left foot ([place]) 12/2018  .  Diverticulosis of colon   . Esophagitis   . GERD (gastroesophageal reflux disease)   . Hemorrhoid    internal  . Hyperlipemia   . Hypertension   . IBS (irritable bowel syndrome)    no current prob - diet controlled  . Myalgia due to statin 11/19/2017  . Neuropathy   . Neuropathy of both feet   . Neuropathy of hand   . Pneumonia    x 4  . PONV (postoperative nausea and vomiting)    on some surgeries but not all procedures  . Skin ulcer of right ankle, limited to breakdown of skin ([place]) 12/31/2016   resolved per patient 05/14/19  . Sleep apnea    has had in the past lost 50 pounds and do longer uses cpap  . Statin intolerance 04/23/2015  . Stroke [facility]) 2009   mini stroke per patient  . Stroke-like episode 2009   TIA - mini stroke per patient  . 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 Uncontrolled type 2 diabetes mellitus with hyperglycemia ([place]) 11/01/2017   Past Surgical History:  Procedure Laterality Date  . ABDOMINAL HYSTERECTOMY    . AMPUTATION Left 12/06/2018   Procedure: PARTIAL AMPUTATION LEFT FOOT;  Surgeon: Edrick Kins, DPM;  Location: [place];  Service: Podiatry;  Laterality: Left;  . AMPUTATION Left 05/16/2019   Procedure: LEFT BELOW KNEE AMPUTATION;  Surgeon: Newt Minion, MD;  Location: [place];  Service: Orthopedics;  Laterality: Left;  . BACK SURGERY  09/02/2015  . BONE BIOPSY Left 12/06/2018   Procedure: Bone Biopsy;  Surgeon: Edrick Kins, DPM;  Location: [place];  Service: Podiatry;  Laterality: Left;  . CERVICAL FUSION    . CESAREAN SECTION  1986  . COLONOSCOPY N/A 11/30/2012   Procedure: COLONOSCOPY;  Surgeon: Irene Shipper, MD;  Location: [facility] ENDOSCOPY;  Service: Endoscopy;  Laterality: N/A;  . COLONOSCOPY WITH PROPOFOL N/A 01/03/2016   Procedure: COLONOSCOPY WITH PROPOFOL;  Surgeon: Manya Silvas, MD;  Location: [facility] ENDOSCOPY;  Service: Endoscopy;  Laterality: N/A;  . ESOPHAGOGASTRODUODENOSCOPY (EGD) WITH PROPOFOL N/A  01/14/2015   Procedure: ESOPHAGOGASTRODUODENOSCOPY (EGD) WITH PROPOFOL;  Surgeon: Manya Silvas, MD;  Location: [facility] ENDOSCOPY;  Service: Endoscopy;  Laterality: N/A;  . IRRIGATION AND DEBRIDEMENT FOOT Left 03/13/2018   Procedure: IRRIGATION AND DEBRIDEMENT FOOT WITH BONE BIOPSY WITH MISONIX DEBRIDER;  Surgeon: Evelina Bucy, DPM;  Location: [place];  Service: Podiatry;  Laterality: Left;  . IRRIGATION AND DEBRIDEMENT FOOT Left 12/06/2018   Procedure: Irrigation And Debridement Foot;  Surgeon: Edrick Kins, DPM;  Location: [place];  Service: Podiatry;  Laterality: Left;  . LEG AMPUTATION BELOW KNEE Left   . LUMBAR WOUND DEBRIDEMENT N/A 10/01/2015   Procedure: LUMBAR WOUND DEBRIDEMENT;  Surgeon: Ashok Pall, MD;  Location: [place] NEURO ORS;  Service: Neurosurgery;  Laterality: N/A;  LUMBAR WOUND DEBRIDEMENT  . NASAL SINUS SURGERY    . SAVORY DILATION  N/A 01/14/2015   Procedure: SAVORY DILATION;  Surgeon: Manya Silvas, MD;  Location[facility] ENDOSCOPY;  Service: Endoscopy;  Laterality: N/A;  . TONSILLECTOMY    . WISDOM TOOTH EXTRACTION     Family History  Problem Relation Age of Onset  . Lung cancer Father   . Hypertension Father   . Arthritis Father   . Other Mother        hardening of the arteries/renal cell carcenoma  . Hypertension Mother   . Stroke Mother   . Kidney cancer Mother   . Arthritis Brother   . Rheum arthritis Maternal Uncle   . Bladder Cancer Neg Hx    Social History   Socioeconomic History  . Marital status: Single    Spouse name: Not on file  . Number of children: 1  . Years of education: Not on file  . Highest education level: Not on file  Occupational History  . Occupation: histology Engineer, production: [employer]  Tobacco Use  . Smoking status: Never Smoker  . Smokeless tobacco: Never Used  Substance and Sexual Activity  . Alcohol use: Never  . Drug use: No  . Sexual activity: Not Currently    Comment: Hysterectomy  Other Topics Concern  . Not on file    Social History Narrative  . Not on file   Social Determinants of Health   Financial Resource Strain: Low Risk   . Difficulty of Paying Living Expenses: Not very hard  Food Insecurity: No Food Insecurity  . Worried About Charity fundraiser in the Last Year: Never true  . Ran Out of Food in the Last Year: Never true  Transportation Needs: No Transportation Needs  . Lack of Transportation (Medical): No  . Lack of Transportation (Non-Medical): No  Physical Activity: Sufficiently Active  . Days of Exercise per Week: 7 days  . Minutes of Exercise per Session: 30 min  Stress: No Stress Concern Present  . Feeling of Stress : Not at all  Social Connections: Somewhat Isolated  . Frequency of Communication with Friends and Family: More than three times a week  . Frequency of Social Gatherings with Friends and Family: Never  . Attends Religious Services: More than 4 times per year  . Active Member of Clubs or Organizations: No  . Attends Archivist Meetings: Never  . Marital Status: Never married    Tobacco Counseling Counseling given: Not Answered   Clinical Intake:  Pre-visit preparation completed: Yes  Pain : No/denies pain     BMI - recorded: 36.52 Nutritional Status: BMI > 30  Obese Nutritional Risks: None Diabetes: Yes CBG done?: No Did pt. bring in CBG monitor from home?: No   Nutrition Risk Assessment:  Has the patient had any N/V/D within the last 2 months?  No  Does the patient have any non-healing wounds?  No  Has the patient had any unintentional weight loss or weight gain?  No   Diabetes:  Is the patient diabetic?  Yes  If diabetic, was a CBG obtained today?  No  Did the patient bring in their glucometer from home?  No  How often do you monitor your CBG's? daily.   Financial Strains and Diabetes Management:  Are you having any financial strains with the device, your supplies or your medication? No .  Does the patient want to be seen by  Chronic Care Management for management of their diabetes?  No  Would the patient like to be  referred to a Nutritionist or for Diabetic Management?  No   Diabetic Exams:  Diabetic Eye Exam: Completed 08/13/19. Record request sent to [facility]  Diabetic Foot Exam: Completed 07/26/18. Pt has been advised about the importance in completing this exam.   How often do you need to have someone help you when you read instructions, pamphlets, or other written materials from your doctor or pharmacy?: 1 - Never  Interpreter Needed?: No  Information entered by :: Clemetine Marker LPN   Activities of Daily Living In your present state of health, do you have any difficulty performing the following activities: 08/21/2019 07/24/2019  Hearing? Y Y  Comment declines hearing aids for now -  Vision? N N  Difficulty concentrating or making decisions? N N  Walking or climbing stairs? Y Y  Comment - Due to amputation  Dressing or bathing? N N  Doing errands, shopping? N N  Preparing Food and eating ? N -  Using the Toilet? N -  In the past six months, have you accidently leaked urine? Y -  Comment wears pads/depends for protection -  Do you have problems with loss of bowel control? N -  Managing your Medications? N -  Managing your Finances? N -  Housekeeping or managing your Housekeeping? N -  Some recent data might be hidden     Immunizations and Health Maintenance Immunization History  Administered Date(s) Administered  . Influenza-Unspecified 06/24/2015  . Pneumococcal Polysaccharide-23 01/20/2014  . Tdap 07/13/2015   Health Maintenance Due  Topic Date Due  . Hepatitis C Screening  Never done  . MAMMOGRAM  10/17/2017  . FOOT EXAM  07/27/2019    Patient Care Team: [name], FNP as PCP - General (Family Medicine) Gabriel Carina Betsey Holiday, MD as Physician Assistant (Endocrinology) Vladimir Crofts, MD as Consulting Physician (Neurology) Trula Slade, DPM as Consulting Physician  (Podiatry)  Indicate any recent Medical Services you may have received from other than [facility] providers in the past year (date may be approximate).     Assessment:   This is a routine wellness examination for [facility].  Hearing/Vision screen  Hearing Screening   _0  _1  _2  _3  _4  _5  _6  _7  _8   Right ear:           Left ear:           Comments: Pt states hearing is fair and needs hearing aids   Vision Screening Comments: Annual vision screenings at [place] issues and exercise activities discussed: Current Exercise Habits: Home exercise routine, Type of exercise: stretching;Other - see comments(PT exercises), Time (Minutes): 30, Frequency (Times/Week): 7, Weekly Exercise (Minutes/Week): 210, Intensity: Mild, Exercise limited by: neurologic condition(s);orthopedic condition(s)  Goals    . Patient Stated     Patient currently in physical therapy to be able to walk again with prosthesis.       Depression Screen PHQ 2/9 Scores 08/21/2019 07/24/2019 05/09/2019 05/07/2019 04/08/2019 02/24/2019 11/27/2018  PHQ - 2 Score 0 0 0 0 0 0 0  PHQ- 9 Score - 0 0 0 - - 0    Fall Risk Fall Risk  08/21/2019 07/24/2019 05/09/2019 05/07/2019 04/08/2019  Falls in the past year? _9 0  Number falls in past yr: - _10 -  Injury with Fall? _11 -  Risk for fall due to : Impaired mobility;Orthopedic patient;Impaired balance/gait - History of fall(s) - Impaired balance/gait;Impaired mobility  Follow up Falls  prevention discussed - Falls evaluation completed Falls evaluation completed Falls evaluation completed;Education provided    FALL RISK PREVENTION PERTAINING TO THE HOME:  Any stairs in or around the home? No  If so, do they handrails? No   Ramp outside  Home free of loose throw rugs in walkways, pet beds, electrical cords, etc? Yes  Adequate lighting in your home to reduce risk of falls? Yes   ASSISTIVE DEVICES UTILIZED TO PREVENT FALLS:  Life  alert? No  Use of a cane, walker or w/c? Yes  Grab bars in the bathroom? No  Shower chair or bench in shower? Yes  Elevated toilet seat or a handicapped toilet? No   DME ORDERS:  DME order needed?  No   TIMED UP AND GO:  Was the test performed? No . Telephonic visit.   Education: Fall risk prevention has been discussed.  Intervention(s) required? No   Cognitive Function: 6CIT deferred for 2021 AWV; pt feels like she is still having brain fog since having Covid     6CIT Screen 01/31/2017  What Year? 0 points  What month? 0 points  What time? 0 points  Count back from 20 0 points  Months in reverse 0 points  Repeat phrase 8 points  Total Score 8    Screening Tests Health Maintenance  Topic Date Due  . Hepatitis C Screening  Never done  . MAMMOGRAM  10/17/2017  . FOOT EXAM  07/27/2019  . HEMOGLOBIN A1C  11/14/2019  . PAP SMEAR-Modifier  11/30/2019  . OPHTHALMOLOGY EXAM  08/12/2020  . TETANUS/TDAP  07/12/2025  . COLONOSCOPY  01/02/2026  . PNEUMOCOCCAL POLYSACCHARIDE VACCINE AGE 43-64 HIGH RISK  Completed  . HIV Screening  Completed  . INFLUENZA VACCINE  Discontinued    Qualifies for Shingles Vaccine? Yes . Due for Shingrix. Education has been provided regarding the importance of this vaccine. Pt has been advised to call insurance company to determine out of pocket expense. Advised may also receive vaccine at local pharmacy or Health Dept. Verbalized acceptance and understanding.  Tdap: Up to date  Flu Vaccine: n/a pt allergic to flu vaccine  Pneumococcal Vaccine: Up to date   Cancer Screenings:  Colorectal Screening: Completed 01/03/16. Repeat every 10 years;   Mammogram: Completed 10/17/16. Repeat every year. Ordered today. Pt provided with contact information and advised to call to schedule appt.   Bone Density: due at age 64  Lung Cancer Screening: (Low Dose CT Chest recommended if Age 79-80 years, 30 pack-year currently smoking OR have quit w/in 15years.)  does not qualify.   Additional Screening:  Hepatitis C Screening: does qualify; postponed  Vision Screening: Recommended annual ophthalmology exams for early detection of glaucoma and other disorders of the eye. Is the patient up to date with their annual eye exam?  Yes  Who is the provider or what is the name of the office in which the pt attends annual eye exams? [place] Screening: Recommended annual dental exams for proper oral hygiene  Community Resource Referral:  CRR required this visit?  No     Plan:    I have personally reviewed and addressed the Medicare Annual Wellness questionnaire and have noted the following in the patient's chart:  A. Medical and social history B. Use of alcohol, tobacco or illicit drugs  C. Current medications and supplements D. Functional ability and status E.  Nutritional status F.  Physical activity G. Advance directives H. List of other physicians I.  Hospitalizations,  surgeries, and ER visits in previous 12 months [place] such as hearing and vision if needed, cognitive and depression L. Referrals and appointments   In addition, I have reviewed and discussed with patient certain preventive protocols, quality metrics, and best practice recommendations. A written personalized care plan for preventive services as well as general preventive health recommendations were provided to patient.   Signed,  Clemetine Marker, LPN Nurse Health Advisor   Nurse Notes: pt feels like neuropathy has worsened and has appt with Dr. Manuella Ghazi tomorrow. She also feels this may attribute to bladder leakage and control issues. Pt was previously scheduled to have lumbar MRI but had to cancel. She also plans to discuss this with Dr. Manuella Ghazi at appt tomorrow.

## 2019-08-25 ENCOUNTER — Other Ambulatory Visit: Payer: Self-pay | Admitting: Neurology

## 2019-08-25 ENCOUNTER — Encounter: Payer: Self-pay | Admitting: Podiatry

## 2019-08-25 ENCOUNTER — Other Ambulatory Visit: Payer: Self-pay

## 2019-08-25 ENCOUNTER — Other Ambulatory Visit (HOSPITAL_COMMUNITY): Payer: Self-pay | Admitting: Neurology

## 2019-08-25 ENCOUNTER — Ambulatory Visit: Payer: Medicare Other | Admitting: Podiatry

## 2019-08-25 VITALS — Temp 96.2°F

## 2019-08-25 DIAGNOSIS — R4189 Other symptoms and signs involving cognitive functions and awareness: Secondary | ICD-10-CM

## 2019-08-25 DIAGNOSIS — M14671 Charcot's joint, right ankle and foot: Secondary | ICD-10-CM

## 2019-08-25 DIAGNOSIS — E1149 Type 2 diabetes mellitus with other diabetic neurological complication: Secondary | ICD-10-CM | POA: Diagnosis not present

## 2019-08-25 DIAGNOSIS — M79674 Pain in right toe(s): Secondary | ICD-10-CM | POA: Diagnosis not present

## 2019-08-25 DIAGNOSIS — S93401A Sprain of unspecified ligament of right ankle, initial encounter: Secondary | ICD-10-CM | POA: Diagnosis not present

## 2019-08-25 DIAGNOSIS — R3915 Urgency of urination: Secondary | ICD-10-CM

## 2019-08-25 DIAGNOSIS — B351 Tinea unguium: Secondary | ICD-10-CM

## 2019-08-25 DIAGNOSIS — M21171 Varus deformity, not elsewhere classified, right ankle: Secondary | ICD-10-CM

## 2019-08-29 ENCOUNTER — Telehealth: Payer: Self-pay | Admitting: Orthopedic Surgery

## 2019-08-29 NOTE — Telephone Encounter (Signed)
Patient called. She would like to speak with someone about the options for physical therapy. She says she can not afford to pay the co pay each time of $35. Her call back number is [phone]

## 2019-08-31 NOTE — Progress Notes (Signed)
Subjective: 64 year old female presents the office today for follow-up evaluation of right ankle pain.  She did pick up the [place] brace was fitting well.  She has no problems with this.  She has no pain at the right foot or ankle today.  She denies any new calluses or wounds.  No falls.   Objective: AAO x3, NAD DP/PT pulses palpable bilaterally, CRT less than 3 seconds Left below-knee amputation Flexible varus deformity present to right ankle.  There is no area of tenderness identified today there is no gross ankle instability present.  Flexor, extensor tendons appear to be intact.  There is no significant callus formation present there is no open lesions.  There is prominence of metatarsal heads plantarly.  Nails on the right foot appear to be hypertrophic, dystrophic, elongated with yellow discoloration.  Nails are causing irritation inside shoes.  No drainage or pus No open lesions or pre-ulcerative lesions.  No pain with calf compression, swelling, warmth, erythema  Assessment: Cavovarus deformity right foot/ankle; symptomatic onychomycosis  Plan: -All treatment options discussed with the patient including all alternatives, risks, complications.  -Doing well on the right side.  Continue with the ankle brace.  Continue to monitor closely for any skin breakdown or callus formation. Debrided the nails with any complications or bleeding.  Return in about 2 months (around 10/25/2019).  Trula Slade DPM

## 2019-09-01 NOTE — Telephone Encounter (Signed)
I think Dr. Sharol Given thought it would be covered if she went to neuro. Not sure if home health PT would be covered

## 2019-09-01 NOTE — Telephone Encounter (Signed)
Do either of you know anything about this? She said it was discussed at last OV. Please advise. Thanks.

## 2019-09-01 NOTE — Telephone Encounter (Signed)
Please advise how you would like to proceed 

## 2019-09-01 NOTE — Telephone Encounter (Signed)
IC s/w patient and she states that last time she was seen  Dr Sharol Given discussed physical therapy with her. She said she has a copay with every visit and is unable to afford this. Can we get her seen at the other facility where Shirlean Mylar sees patients that would be an inpatient facility copay rather than an outpatient facility copay that she would have to pay here. Can you please

## 2019-09-02 ENCOUNTER — Other Ambulatory Visit: Payer: Self-pay | Admitting: Orthopedic Surgery

## 2019-09-02 DIAGNOSIS — S88119A Complete traumatic amputation at level between knee and ankle, unspecified lower leg, initial encounter: Secondary | ICD-10-CM

## 2019-09-02 NOTE — Telephone Encounter (Signed)
Order written for PT at neuro rehab

## 2019-09-04 NOTE — Telephone Encounter (Signed)
I called patient and advised referral has been entered. She will discuss copay with that facility.

## 2019-09-05 ENCOUNTER — Ambulatory Visit: Payer: Medicare Other

## 2019-09-12 ENCOUNTER — Ambulatory Visit
Admission: RE | Admit: 2019-09-12 | Discharge: 2019-09-12 | Disposition: A | Discharge status: Home / Self Care | Payer: Medicare Other | Source: Ambulatory Visit | Attending: Neurology | Admitting: Neurology

## 2019-09-12 ENCOUNTER — Other Ambulatory Visit: Payer: Self-pay

## 2019-09-12 DIAGNOSIS — R4189 Other symptoms and signs involving cognitive functions and awareness: Secondary | ICD-10-CM

## 2019-09-12 DIAGNOSIS — R3915 Urgency of urination: Secondary | ICD-10-CM | POA: Insufficient documentation

## 2019-09-12 MED ORDER — GADOBUTROL 1 MMOL/ML IV SOLN
7.5000 mL | Freq: Once | INTRAVENOUS | Status: AC | PRN
  Administered 2019-09-12: 7.5 mL via INTRAVENOUS

## 2019-09-22 ENCOUNTER — Encounter: Payer: Self-pay | Admitting: Physical Therapy

## 2019-09-22 ENCOUNTER — Other Ambulatory Visit: Payer: Self-pay

## 2019-09-22 ENCOUNTER — Ambulatory Visit (INDEPENDENT_AMBULATORY_CARE_PROVIDER_SITE_OTHER): Payer: Medicare Other | Admitting: Physical Therapy

## 2019-09-22 DIAGNOSIS — M25662 Stiffness of left knee, not elsewhere classified: Secondary | ICD-10-CM

## 2019-09-22 DIAGNOSIS — R531 Weakness: Secondary | ICD-10-CM | POA: Diagnosis not present

## 2019-09-22 DIAGNOSIS — R2681 Unsteadiness on feet: Secondary | ICD-10-CM

## 2019-09-22 DIAGNOSIS — R293 Abnormal posture: Secondary | ICD-10-CM | POA: Diagnosis not present

## 2019-09-22 DIAGNOSIS — R2689 Other abnormalities of gait and mobility: Secondary | ICD-10-CM | POA: Diagnosis not present

## 2019-09-22 DIAGNOSIS — Z9181 History of falling: Secondary | ICD-10-CM

## 2019-09-22 NOTE — Therapy (Signed)
[facility] [address] [place], [place], [address] Phone: [phone]   Fax:  [phone]  Physical Therapy Evaluation  Patient Details  Name: Olivia Romero MRN: [id number] Date of Birth: Mar 30, 1956 Referring Provider (PT): Meridee Score, MD   Encounter Date: 09/22/2019  PT End of Session - 09/22/19 1333    Visit Number  1    Number of Visits  25    Date for PT Re-Evaluation  12/21/19    Authorization Type  [employer] Medicare, $30 co-pay, no visit limit    PT Start Time  0802    PT Stop Time  0850    PT Time Calculation (min)  48 min    Equipment Utilized During Treatment  Gait belt    Activity Tolerance  Patient tolerated treatment well    Behavior During Therapy  [facility] for tasks assessed/performed       Past Medical History:  Diagnosis Date  . Anemia   . Arthritis   . Asthma   . Colon polyps    adenomatous  . Diabetes mellitus without complication ([place])   . Diabetic infection of left foot ([place]) 12/2018  . Diverticulosis of colon   . Esophagitis   . GERD (gastroesophageal reflux disease)   . Hemorrhoid    internal  . Hyperlipemia   . Hypertension   . IBS (irritable bowel syndrome)    no current prob - diet controlled  . Myalgia due to statin 11/19/2017  . Neuropathy   . Neuropathy of both feet   . Neuropathy of hand   . Pneumonia    x 4  . PONV (postoperative nausea and vomiting)    on some surgeries but not all procedures  . Skin ulcer of right ankle, limited to breakdown of skin ([place]) 12/31/2016   resolved per patient 05/14/19  . Sleep apnea    has had in the past lost 50 pounds and do longer uses cpap  . Statin intolerance 04/23/2015  . Stroke [facility]) 2009   mini stroke per patient  . Stroke-like episode 2009   TIA - mini stroke per patient  . 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 Uncontrolled type 2 diabetes mellitus with hyperglycemia ([place]) 11/01/2017    Past Surgical History:   Procedure Laterality Date  . ABDOMINAL HYSTERECTOMY    . AMPUTATION Left 12/06/2018   Procedure: PARTIAL AMPUTATION LEFT FOOT;  Surgeon: Edrick Kins, DPM;  Location: [place];  Service: Podiatry;  Laterality: Left;  . AMPUTATION Left 05/16/2019   Procedure: LEFT BELOW KNEE AMPUTATION;  Surgeon: Newt Minion, MD;  Location: [place];  Service: Orthopedics;  Laterality: Left;  . BACK SURGERY  09/02/2015  . BONE BIOPSY Left 12/06/2018   Procedure: Bone Biopsy;  Surgeon: Edrick Kins, DPM;  Location: [place];  Service: Podiatry;  Laterality: Left;  . CERVICAL FUSION    . CESAREAN SECTION  1986  . COLONOSCOPY N/A 11/30/2012   Procedure: COLONOSCOPY;  Surgeon: Irene Shipper, MD;  Location: [facility] ENDOSCOPY;  Service: Endoscopy;  Laterality: N/A;  . COLONOSCOPY WITH PROPOFOL N/A 01/03/2016   Procedure: COLONOSCOPY WITH PROPOFOL;  Surgeon: Manya Silvas, MD;  Location: [facility] ENDOSCOPY;  Service: Endoscopy;  Laterality: N/A;  . ESOPHAGOGASTRODUODENOSCOPY (EGD) WITH PROPOFOL N/A 01/14/2015   Procedure: ESOPHAGOGASTRODUODENOSCOPY (EGD) WITH PROPOFOL;  Surgeon: Manya Silvas, MD;  Location: [facility] ENDOSCOPY;  Service: Endoscopy;  Laterality: N/A;  . IRRIGATION AND DEBRIDEMENT FOOT Left 03/13/2018   Procedure: IRRIGATION  AND DEBRIDEMENT FOOT WITH BONE BIOPSY WITH MISONIX DEBRIDER;  Surgeon: Evelina Bucy, DPM;  Location: [place];  Service: Podiatry;  Laterality: Left;  . IRRIGATION AND DEBRIDEMENT FOOT Left 12/06/2018   Procedure: Irrigation And Debridement Foot;  Surgeon: Edrick Kins, DPM;  Location: [place];  Service: Podiatry;  Laterality: Left;  . LEG AMPUTATION BELOW KNEE Left   . LUMBAR WOUND DEBRIDEMENT N/A 10/01/2015   Procedure: LUMBAR WOUND DEBRIDEMENT;  Surgeon: Ashok Pall, MD;  Location: [place] NEURO ORS;  Service: Neurosurgery;  Laterality: N/A;  LUMBAR WOUND DEBRIDEMENT  . NASAL SINUS SURGERY    . SAVORY DILATION N/A 01/14/2015   Procedure: SAVORY DILATION;  Surgeon: Manya Silvas, MD;  Location:  [facility] ENDOSCOPY;  Service: Endoscopy;  Laterality: N/A;  . TONSILLECTOMY    . WISDOM TOOTH EXTRACTION      There were no vitals filed for this visit.   Subjective Assessment - 09/22/19 0805    Subjective  This 64yo female was referred on 09/02/2019 by Meridee Score, MD with BKA. She underwent a left transtibial amputation on 05/16/2019 secondary to Osteomyelitis with partial foot amputation 12/06/2018. She recieved her prosthesis on 09/19/2019 from [name] at [place].    Pertinent History  L TTA, DM, CVA, sleep apnea, PNA, neuropathy bil. 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  No/denies         [facility] PT Assessment - 09/22/19 0800      Assessment   Medical Diagnosis  Left Transtibial Amputation    Referring Provider (PT)  Meridee Score, MD    Onset Date/Surgical Date  09/19/19   prosthesis delivery   Hand Dominance  Right    Prior Therapy  [place]      Precautions   Precautions  Fall;Other (comment)   No lifting >15#     Restrictions   Weight Bearing Restrictions  No      Balance Screen   Has the patient fallen in the past 6 months  Yes    How many times?  1-3 weekly but none in last 3 weeks     Has the patient had a decrease in activity level because of a fear of falling?   Yes    Is the patient reluctant to leave their home because of a fear of falling?   No   uses w/c     Home Social worker  Private residence    [place]   currently son / dtr-law & 59yo grandson   Type of [place] entrance   back door has 2 steps + threshold no rails   Home Layout  One level    [place] - 2 wheels;Walker - 4 wheels;Cane - single point;Crutches;Bedside  commode;Tub bench;Hand held shower head;Wheelchair - manual      Prior Function   Level of Independence  Independent;Independent with household mobility without device;Independent with community mobility without device    Vocation  On disability    Leisure  active with grandson & with church youth group      Posture/Postural Control   Posture/Postural Control  Postural limitations    Postural Limitations  Rounded Shoulders;Forward head;Flexed trunk;Weight shift right      ROM / Strength   AROM / PROM / Strength  Strength;AROM      AROM   Overall AROM   Deficits    Overall AROM Comments  left knee extension seated -10      Strength   Overall Strength  Deficits    Overall Strength Comments  Gross UE Bilateral shoulders 3/5, elbows 4/5, grip fair    Strength Assessment Site  Hip;Knee;Ankle    Right Hip Flexion  3/5    Right Hip Extension  2+/5   gross testing seated   Right Hip ABduction  3-/5   gross testing seated   Right Hip ADduction  3+/5   gross testing seated   Left Hip Flexion  3-/5    Left Hip Extension  2+/5   gross testing seated   Left Hip ABduction  3-/5   gross testing seated   Left Hip ADduction  3/5   gross testing seated   Right/Left Knee  Right;Left    Right Knee Flexion  3+/5   gross testing seated   Right Knee Extension  4/5    Left Knee Flexion  3-/5   gross testing seated   Left Knee Extension  3/5    Right Ankle Dorsiflexion  2/5      Transfers   Transfers  Sit to Stand;Stand to Sit    Sit to Stand  5: Supervision;With upper extremity assist;With armrests;From chair/3-in-1   requires RW to stabilize, uses LLE > RLE   Stand to Sit  5: Supervision;With upper extremity assist;With armrests;To chair/3-in-1   from RW for stability, uses RLE > LLE     Ambulation/Gait   Ambulation/Gait  Yes    Ambulation/Gait Assistance  5: Supervision    Ambulation/Gait Assistance Details  excessive UE weight bearing on RW    Ambulation Distance (Feet)  5 Feet     Assistive device  Rolling walker;Prosthesis    Gait Pattern  Step-to pattern;Decreased step length - right;Decreased stance time - left;Decreased stride length;Decreased weight shift to left    Ambulation Surface  Level;Indoor      Balance   Balance Assessed  Yes      Static Standing Balance   Static Standing - Balance Support  Bilateral upper extremity supported;No upper extremity supported    Static Standing - Level of Assistance  5: Stand by assistance;4: Min assist   supervision with RW support & minA no UE support   Static Standing - Comment/# of Minutes  multiple attempts to stand up to 10 sec without UE support.  >2 minutes with RW / BUE support      Dynamic Standing Balance   Dynamic Standing - Balance Support  Left upper extremity supported    Dynamic Standing - Level of Assistance  5: Stand by assistance;4: Min assist   supervision scanning & minA reaching   Dynamic Standing - Balance Activities  Head nods;Head turns;Reaching for objects    Dynamic Standing - Comments  scans with RW support BUEs using cervical only motion;  reaches 2" with RUE with LUE support with minA      Prosthetics Assessment - 09/22/19 0800      Prosthetics   Prosthetic Care Dependent with  Skin check;Residual limb care;Care of non-amputated limb;Prosthetic cleaning;Ply sock cleaning;Correct ply sock adjustment;Proper wear schedule/adjustment;Proper weight-bearing schedule/adjustment    Donning prosthesis   Supervision    Doffing prosthesis   Supervision    Current  prosthetic wear tolerance (days/week)   3 of 3 days since delivery    Current prosthetic wear tolerance (#hours/day)   2hrs 2x/day; PT instructed plans to increase q5 days if no issues with skin or tenderness.     Current prosthetic weight-bearing tolerance (hours/day)   patient tolerated standing 3 minutes with UE support without c/o limb pain or tenderness    Edema  pitting    Residual limb condition   No open areas,  frail looking skin,  pale color, colder temperature, no hair growth (shaving PT advised against) clinderical shape    Prosthesis Description  Flexible keel foot, silicon gel liner with shuttle pin lock, total contact socket    K code/activity level with prosthetic use   K2 basic community level               Objective measurements completed on examination: See above findings.      [place] Adult PT Treatment/Exercise - 09/22/19 0800      Prosthetics   Prosthetic Care Comments   Sitting positioning with heel supported.  Right ankle SMO orthosis: PT instructed to donne compression sock upon arising to minimize edema and donning shoe over orthosis.      Education Provided  Skin check;Residual limb care;Prosthetic cleaning;Correct ply sock adjustment;Proper Donning;Proper wear schedule/adjustment;Other (comment)   seee prosthetic care comments   Person(s) Educated  Patient    Education Method  Explanation;Demonstration;Tactile cues;Verbal cues    Education Method  Verbalized understanding;Returned demonstration;Tactile cues required;Verbal cues required;Needs further instruction               PT Short Term Goals - 09/22/19 1341      PT SHORT TERM GOAL #1   Title  Patient demonstrates proper donning & verbalizes proper cleaning of prosthesis. (All STGs Target Date: 10/23/2019)    Time  1    Period  Months    Status  New    Target Date  10/23/19      PT SHORT TERM GOAL #2   Title  Patient tolerates prosthesis wear >10 hrs total/ day without skin or limb pain issues.    Time  1    Period  Months    Status  New    Target Date  10/23/19      PT SHORT TERM GOAL #3   Title  Patient standing balance static with eyes open 20 seconds within 3 attempts and reaches 7" anteriorly with RW support with supervision.    Time  1    Period  Months    Status  New    Target Date  10/23/19      PT SHORT TERM GOAL #4   Title  Patient ambulates 100' with RW & prosthesis with supervision.    Time  1     Period  Months    Status  New    Target Date  10/23/19      PT SHORT TERM GOAL #5   Title  patient negotiates ramps & curbs with rolling walker & prosthesis with supervision.    Time  1    Period  Months    Status  New    Target Date  10/23/19        PT Long Term Goals - 09/22/19 1335      PT LONG TERM GOAL #1   Title  Patient verbalizes & demonstrates proper prosthetic care to enable safe utilization of prosthesis.  (All LTGs Target Date 12/19/2019)    Time  3    Period  Months    Status  New    Target Date  12/19/19      PT LONG TERM GOAL #2   Title  Patient tolerates prosthesis wear & right ankle orthosis wear >90% of awake hours to enable function throughout her day.    Time  3    Period  Months    Status  New    Target Date  12/19/19      PT LONG TERM GOAL #3   Title  Standing balance: static without UE support eyes open for 30 seconds and dynamic with UE support reaching 10", pick up object from floor, managing clothes & static eyes closed 2 minutes safely modified independent.    Time  3    Period  Months    Status  New    Target Date  12/19/19      PT LONG TERM GOAL #4   Title  Patient ambulates 300' including grass with cane or less and prosthesis modified independent for community access.    Time  3    Period  Months    Status  New    Target Date  12/19/19      PT LONG TERM GOAL #5   Title  Patient negotiates ramps & curbs with cane or less & prosthesis modified independent for community access.    Time  3    Period  Months    Status  New    Target Date  12/19/19             Plan - 09/22/19 1345    Clinical Impression Statement  This 65yo female underwent a left Transtibial amputation on 05/16/2019 and recieved her prosthesis on 09/19/2019 (3 days prior to PT evaluation). She has limited wear of 2hrs 2x/day which limits function during her day. She is dependent in prosthetic care requiring skilled instruction to progress safe use.  She has right  ankle weakness & deformity with an orthosis for all weight bearing activities.  She has significant weakness from prolonged limited mobility.  Patient has impaired standing balance & posture with high fall risk.  She has dependency in prosthetic gait with limited distance of 5' with heavy rolling walker support and significant gait deviations.  She would benefit from skilled PT to progress her mobility & safety.    Personal Factors and Comorbidities  Comorbidity 3+;Fitness;Time since onset of injury/illness/exacerbation    Comorbidities  L TTA, DM, CVA, sleep apnea, PNA, neuropathy bil. feet and hand, HTN, s/p cervical fusion, s/p back surgery, IBS, asthma, Covid in Jan 2021    Examination-Activity Limitations  Hygiene/Grooming;Locomotion Level;Reach Overhead;Stairs;Stand;Transfers    Examination-Participation Restrictions  Recruitment consultant  Evolving/Moderate complexity    Clinical Decision Making  Moderate    Rehab Potential  Good    PT Frequency  2x / week    PT Duration  12 weeks    PT Treatment/Interventions  ADLs/Self Care Home Management;DME Instruction;Gait training;Stair training;Functional mobility training;Therapeutic activities;Therapeutic exercise;Balance training;Neuromuscular re-education;Patient/family education;Orthotic Fit/Training;Prosthetic Training;Vestibular    PT Next Visit Plan  review prosthetic care, HEP at sink for balance, prosthetic gait with RW    Consulted and Agree with Plan of Care  Patient       Patient will benefit from skilled therapeutic intervention in order to improve the following deficits and impairments:  Abnormal gait, Decreased activity tolerance, Decreased balance, Decreased endurance, Decreased knowledge of use of DME, Decreased mobility,  Decreased range of motion, Decreased safety awareness, Decreased strength, Increased edema, Impaired flexibility, Impaired sensation, Postural dysfunction, Prosthetic  Dependency, Obesity, Pain  Visit Diagnosis: Other abnormalities of gait and mobility  Unsteadiness on feet  Abnormal posture  Weakness generalized  History of fall  Stiffness of left knee, not elsewhere classified     Problem List Patient Active Problem List   Diagnosis Date Noted  . History of below knee amputation, left ([place]) 07/30/2019  . Gangrene of left foot ([place]) 05/16/2019  . Subacute osteomyelitis of left foot ([place])   . Charcot's joint of foot, left   . Post-operative state   . PAD (peripheral artery disease) ([place]) 12/05/2018  . Osteomyelitis of ankle or foot, acute, left ([place]) 12/05/2018  . MRSA bacteremia 12/05/2018  . Sepsis without acute organ dysfunction ([place])   . Charcot's joint of foot due to diabetes ([place]) 09/18/2018  . BMI 36.0-36.9,adult   . Morbid obesity ([place]) 11/19/2017  . B12 deficiency 11/01/2017  . Diabetic foot ulcer ([place]) 12/14/2016  . Diabetic polyneuropathy associated with type 2 diabetes mellitus ([place]) 12/13/2015  . Lumbar stenosis with neurogenic claudication 09/02/2015  . Chronic constipation 07/13/2015  . OSA (obstructive sleep apnea) 06/25/2015  . 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/23/2015  . Hypertriglyceridemia 04/23/2015  . Asthma, mild intermittent 02/01/2015  . Type 2 diabetes mellitus with diabetic nephropathy ([place]) 12/31/2014  . Gastroesophageal reflux disease with esophagitis 12/31/2014  . Dysphagia 12/31/2014  . Bilateral carpal tunnel syndrome 12/01/2014  . Diverticulosis of colon (without mention of hemorrhage) 11/30/2012  . Essential hypertension, benign 11/28/2012    Jamey Reas PT, DPT 09/22/2019, 2:42 PM  [facility] [address] [place], [place], [address] Phone: [phone]   Fax:  [phone]  Name: Olivia Romero MRN: [id number] Date of Birth: 02-12-1956

## 2019-09-26 ENCOUNTER — Other Ambulatory Visit: Payer: Self-pay

## 2019-09-26 ENCOUNTER — Ambulatory Visit (INDEPENDENT_AMBULATORY_CARE_PROVIDER_SITE_OTHER): Payer: Medicare Other | Admitting: Physical Therapy

## 2019-09-26 DIAGNOSIS — M25662 Stiffness of left knee, not elsewhere classified: Secondary | ICD-10-CM

## 2019-09-26 DIAGNOSIS — R2681 Unsteadiness on feet: Secondary | ICD-10-CM

## 2019-09-26 DIAGNOSIS — R293 Abnormal posture: Secondary | ICD-10-CM | POA: Diagnosis not present

## 2019-09-26 DIAGNOSIS — R2689 Other abnormalities of gait and mobility: Secondary | ICD-10-CM

## 2019-09-26 DIAGNOSIS — R531 Weakness: Secondary | ICD-10-CM

## 2019-09-26 DIAGNOSIS — Z9181 History of falling: Secondary | ICD-10-CM

## 2019-09-26 NOTE — Therapy (Signed)
[facility] Physical Therapy [address] [place], [place], [address] Phone: [phone]   Fax:  [phone]  Physical Therapy Treatment  Patient Details  Name: Olivia Romero MRN: [id number] Date of Birth: [birth year] Referring Provider (PT): Meridee Score, MD   Encounter Date: 09/26/2019  PT End of Session - 09/26/19 1624    Visit Number  2    Number of Visits  25    Date for PT Re-Evaluation  12/21/19    Authorization Type  [employer] Medicare, $30 co-pay, no visit limit    PT Start Time  1525    PT Stop Time  1610    PT Time Calculation (min)  45 min    Equipment Utilized During Treatment  Gait belt    Activity Tolerance  Patient tolerated treatment well    Behavior During Therapy  [facility] for tasks assessed/performed       Past Medical History:  Diagnosis Date  . Anemia   . Arthritis   . Asthma   . Colon polyps    adenomatous  . Diabetes mellitus without complication ([place])   . Diabetic infection of left foot ([place]) 12/2018  . Diverticulosis of colon   . Esophagitis   . GERD (gastroesophageal reflux disease)   . Hemorrhoid    internal  . Hyperlipemia   . Hypertension   . IBS (irritable bowel syndrome)    no current prob - diet controlled  . Myalgia due to statin 11/19/2017  . Neuropathy   . Neuropathy of both feet   . Neuropathy of hand   . Pneumonia    x 4  . PONV (postoperative nausea and vomiting)    on some surgeries but not all procedures  . Skin ulcer of right ankle, limited to breakdown of skin ([place]) 12/31/2016   resolved per patient 05/14/19  . Sleep apnea    has had in the past lost 50 pounds and do longer uses cpap  . Statin intolerance 04/23/2015  . Stroke [facility]) 2009   mini stroke per patient  . Stroke-like episode 2009   TIA - mini stroke per patient  . 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 Uncontrolled type 2 diabetes mellitus with hyperglycemia ([place]) 11/01/2017    Past Surgical History:   Procedure Laterality Date  . ABDOMINAL HYSTERECTOMY    . AMPUTATION Left 12/06/2018   Procedure: PARTIAL AMPUTATION LEFT FOOT;  Surgeon: Edrick Kins, DPM;  Location: [place];  Service: Podiatry;  Laterality: Left;  . AMPUTATION Left 05/16/2019   Procedure: LEFT BELOW KNEE AMPUTATION;  Surgeon: Newt Minion, MD;  Location: [place];  Service: Orthopedics;  Laterality: Left;  . BACK SURGERY  09/02/2015  . BONE BIOPSY Left 12/06/2018   Procedure: Bone Biopsy;  Surgeon: Edrick Kins, DPM;  Location: [place];  Service: Podiatry;  Laterality: Left;  . CERVICAL FUSION    . CESAREAN SECTION  1986  . COLONOSCOPY N/A 11/30/2012   Procedure: COLONOSCOPY;  Surgeon: Irene Shipper, MD;  Location: [facility];  Service: Endoscopy;  Laterality: N/A;  . COLONOSCOPY WITH PROPOFOL N/A 01/03/2016   Procedure: COLONOSCOPY WITH PROPOFOL;  Surgeon: Manya Silvas, MD;  Location: [facility] ENDOSCOPY;  Service: Endoscopy;  Laterality: N/A;  . ESOPHAGOGASTRODUODENOSCOPY (EGD) WITH PROPOFOL N/A 01/14/2015   Procedure: ESOPHAGOGASTRODUODENOSCOPY (EGD) WITH PROPOFOL;  Surgeon: Manya Silvas, MD;  Location: [facility] ENDOSCOPY;  Service: Endoscopy;  Laterality: N/A;  . IRRIGATION AND DEBRIDEMENT FOOT Left 03/13/2018   Procedure: IRRIGATION  AND DEBRIDEMENT FOOT WITH BONE BIOPSY WITH MISONIX DEBRIDER;  Surgeon: Evelina Bucy, DPM;  Location: [place];  Service: Podiatry;  Laterality: Left;  . IRRIGATION AND DEBRIDEMENT FOOT Left 12/06/2018   Procedure: Irrigation And Debridement Foot;  Surgeon: Edrick Kins, DPM;  Location: [place];  Service: Podiatry;  Laterality: Left;  . LEG AMPUTATION BELOW KNEE Left   . LUMBAR WOUND DEBRIDEMENT N/A 10/01/2015   Procedure: LUMBAR WOUND DEBRIDEMENT;  Surgeon: Ashok Pall, MD;  Location: [place] [facility];  Service: Neurosurgery;  Laterality: N/A;  LUMBAR WOUND DEBRIDEMENT  . NASAL SINUS SURGERY    . SAVORY DILATION N/A 01/14/2015   Procedure: SAVORY DILATION;  Surgeon: Manya Silvas, MD;  Location:  [facility] ENDOSCOPY;  Service: Endoscopy;  Laterality: N/A;  . TONSILLECTOMY    . WISDOM TOOTH EXTRACTION      There were no vitals filed for this visit.  Subjective Assessment - 09/26/19 1619    Subjective  relays some phantom pains today, relays she thinks her prosthesis is too loose as the foot is turned out more    Pertinent History  L TTA, DM, CVA, sleep apnea, PNA, neuropathy bil. 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, in community                       [facility] Adult PT Treatment/Exercise - 09/26/19 0001      Transfers   Transfers  Sit to Stand;Stand to Sit;Stand Pivot Transfers    Sit to Stand  5: Supervision;With upper extremity assist;With armrests;From chair/3-in-1    Stand to Sit  5: Supervision;With upper extremity assist;With armrests;To chair/3-in-1    Stand Pivot Transfers  4: Min guard    Transfer via [place]  Other/comments   RW   Number of Reps  --   5 reps total     Ambulation/Gait   Ambulation/Gait  Yes    Ambulation/Gait Assistance  4: Min guard    Ambulation/Gait Assistance Details  cues for proper step length and wt shift to her Lt    Ambulation Distance (Feet)  --   10 ft, then 10 ft, then 20 ft    Assistive device  Rolling walker;Prosthesis    Gait Pattern  Step-to pattern;Decreased step length - right;Decreased stance time - left;Decreased stride length;Decreased weight shift to left      Exercises   Exercises  Other Exercises    Other Exercises   standing at sink with bilat UE suppport for mini squats X 10 reps, then seated rest break and then performed standing weight lateral and ant-post weight shifting with bilat UE support progressed to bilat fingertip support. Then seated rest break and then performed  standing balance at sink no UE support but touched down as needed for 4 minutes total      Prosthetics   Prosthetic Care Comments   showed her how to add socks when socket is too loose and foot is rotated out               PT Short Term Goals - 09/22/19 1341      PT SHORT TERM GOAL #1   Title  Patient demonstrates proper donning & verbalizes proper cleaning of prosthesis. (All STGs Target Date: 10/23/2019)  Time  1    Period  Months    Status  New    Target Date  10/23/19      PT SHORT TERM GOAL #2   Title  Patient tolerates prosthesis wear >10 hrs total/ day without skin or limb pain issues.    Time  1    Period  Months    Status  New    Target Date  10/23/19      PT SHORT TERM GOAL #3   Title  Patient standing balance static with eyes open 20 seconds within 3 attempts and reaches 7" anteriorly with RW support with supervision.    Time  1    Period  Months    Status  New    Target Date  10/23/19      PT SHORT TERM GOAL #4   Title  Patient ambulates 100' with RW & prosthesis with supervision.    Time  1    Period  Months    Status  New    Target Date  10/23/19      PT SHORT TERM GOAL #5   Title  patient negotiates ramps & curbs with rolling walker & prosthesis with supervision.    Time  1    Period  Months    Status  New    Target Date  10/23/19        PT Long Term Goals - 09/22/19 1335      PT LONG TERM GOAL #1   Title  Patient verbalizes & demonstrates proper prosthetic care to enable safe utilization of prosthesis.  (All LTGs Target Date 12/19/2019)    Time  3    Period  Months    Status  New    Target Date  12/19/19      PT LONG TERM GOAL #2   Title  Patient tolerates prosthesis wear & right ankle orthosis wear >90% of awake hours to enable function throughout her day.    Time  3    Period  Months    Status  New    Target Date  12/19/19      PT LONG TERM GOAL #3   Title  Standing balance: static without UE support eyes open for 30 seconds  and dynamic with UE support reaching 10", pick up object from floor, managing clothes & static eyes closed 2 minutes safely modified independent.    Time  3    Period  Months    Status  New    Target Date  12/19/19      PT LONG TERM GOAL #4   Title  Patient ambulates 300' including grass with cane or less and prosthesis modified independent for community access.    Time  3    Period  Months    Status  New    Target Date  12/19/19      PT LONG TERM GOAL #5   Title  Patient negotiates ramps & curbs with cane or less & prosthesis modified independent for community access.    Time  3    Period  Months    Status  New    Target Date  12/19/19            Plan - 09/26/19 1624    Clinical Impression Statement  She arrived with prosthesis in ER, showed her how to don more socks going up to 5 Ply sock and this allowed for improved suspension. She was then able to  progress her standing/gait tolerance. Showed her 3 beginning exercises to add at home at sink with chair behind her and she showed good understading with this.    Personal Factors and Comorbidities  Comorbidity 3+;Fitness;Time since onset of injury/illness/exacerbation    Comorbidities  L TTA, DM, CVA, sleep apnea, PNA, neuropathy bil. feet and hand, HTN, s/p cervical fusion, s/p back surgery, IBS, asthma, Covid in Jan 2021    Examination-Activity Limitations  Hygiene/Grooming;Locomotion Level;Reach Overhead;Stairs;Stand;Transfers    Examination-Participation Restrictions  Recruitment consultant  Evolving/Moderate complexity    Rehab Potential  Good    PT Frequency  2x / week    PT Duration  12 weeks    PT Treatment/Interventions  ADLs/Self Care Home Management;DME Instruction;Gait training;Stair training;Functional mobility training;Therapeutic activities;Therapeutic exercise;Balance training;Neuromuscular re-education;Patient/family education;Orthotic Fit/Training;Prosthetic  Training;Vestibular    PT Next Visit Plan  review prosthetic care, HEP at sink for balance, prosthetic gait with RW    PT Home Exercise Plan  at sink for mini squats with UE support, weight shifts with UE support, and balance feet apart no Ue support all with chair behind her    Consulted and Agree with Plan of Care  Patient       Patient will benefit from skilled therapeutic intervention in order to improve the following deficits and impairments:  Abnormal gait, Decreased activity tolerance, Decreased balance, Decreased endurance, Decreased knowledge of use of DME, Decreased mobility, Decreased range of motion, Decreased safety awareness, Decreased strength, Increased edema, Impaired flexibility, Impaired sensation, Postural dysfunction, Prosthetic Dependency, Obesity, Pain  Visit Diagnosis: Other abnormalities of gait and mobility  Unsteadiness on feet  Abnormal posture  Weakness generalized  History of fall  Stiffness of left knee, not elsewhere classified     Problem List Patient Active Problem List   Diagnosis Date Noted  . History of below knee amputation, left ([place]) 07/30/2019  . Gangrene of left foot ([place]) 05/16/2019  . Subacute osteomyelitis of left foot ([place])   . Charcot's joint of foot, left   . Post-operative state   . PAD (peripheral artery disease) ([place]) 12/05/2018  . Osteomyelitis of ankle or foot, acute, left ([place]) 12/05/2018  . MRSA bacteremia 12/05/2018  . Sepsis without acute organ dysfunction ([place])   . Charcot's joint of foot due to diabetes ([place]) 09/18/2018  . BMI 36.0-36.9,adult   . Morbid obesity ([place]) 11/19/2017  . B12 deficiency 11/01/2017  . Diabetic foot ulcer ([place]) 12/14/2016  . Diabetic polyneuropathy associated with type 2 diabetes mellitus ([place]) 12/13/2015  . Lumbar stenosis with neurogenic claudication 09/02/2015  . Chronic constipation 07/13/2015  . OSA (obstructive sleep apnea) 06/25/2015  . Primary osteoarthritis involving multiple  joints 04/23/2015  . Hypertriglyceridemia 04/23/2015  . Asthma, mild intermittent 02/01/2015  . Type 2 diabetes mellitus with diabetic nephropathy ([place]) 12/31/2014  . Gastroesophageal reflux disease with esophagitis 12/31/2014  . Dysphagia 12/31/2014  . Bilateral carpal tunnel syndrome 12/01/2014  . Diverticulosis of colon (without mention of hemorrhage) 11/30/2012  . Essential hypertension, benign 11/28/2012    Silvestre Mesi 09/26/2019, 4:27 PM  [facility] Physical Therapy [address] [place], [place], [address] Phone: [phone]   Fax:  [phone]  Name: LORE LOPEZ MRN: [id number] Date of Birth: 1956-05-09

## 2019-09-30 ENCOUNTER — Ambulatory Visit (INDEPENDENT_AMBULATORY_CARE_PROVIDER_SITE_OTHER): Payer: Medicare Other | Admitting: Family Medicine

## 2019-09-30 ENCOUNTER — Encounter: Payer: Self-pay | Admitting: Family Medicine

## 2019-09-30 ENCOUNTER — Other Ambulatory Visit: Payer: Self-pay

## 2019-09-30 VITALS — BP 124/82 | HR 99 | Temp 97.9°F | Resp 14 | Ht 60.0 in

## 2019-09-30 DIAGNOSIS — H6093 Unspecified otitis externa, bilateral: Secondary | ICD-10-CM

## 2019-09-30 DIAGNOSIS — R32 Unspecified urinary incontinence: Secondary | ICD-10-CM

## 2019-09-30 DIAGNOSIS — R8290 Unspecified abnormal findings in urine: Secondary | ICD-10-CM

## 2019-09-30 DIAGNOSIS — R350 Frequency of micturition: Secondary | ICD-10-CM | POA: Diagnosis not present

## 2019-09-30 LAB — POCT URINALYSIS DIPSTICK
Bilirubin, UA: NEGATIVE
Blood, UA: NEGATIVE
Glucose, UA: POSITIVE — AB
Ketones, UA: NEGATIVE
Leukocytes, UA: NEGATIVE
Nitrite, UA: NEGATIVE
Protein, UA: NEGATIVE
Spec Grav, UA: 1.015 (ref 1.010–1.025)
Urobilinogen, UA: 0.2 E.U./dL
pH, UA: 5 (ref 5.0–8.0)

## 2019-09-30 MED ORDER — ASSURANCE FITTED BRIEF LARGE MISC: 5 refills | Status: DC

## 2019-09-30 MED ORDER — NEOMYCIN-POLYMYXIN-HC 3.5-10000-1 OT SOLN: 4.0000 [drp] | Freq: Four times a day (QID) | OTIC | 0 refills | Status: AC

## 2019-09-30 NOTE — Progress Notes (Signed)
Patient ID: Olivia Romero, female    DOB: [birth year], 64 y.o.   MRN: [id number]  PCP: [name], FNP  Chief Complaint  Patient presents with  . Urinary Frequency    urinary incontinence (odor) would like to see about getting rx for adult pullups  . MRI    review from neurology [facility])  . Ear Fullness    itching in ears    Subjective:   Olivia Romero is a 64 y.o. female, presents to clinic with CC of the following:  HPI  Patient presents for urinary symptoms which are not new but it seemed to become much more bothersome over the past 5 to 6 months. She reports that she is established with neurology and discussed this with them with a recent visit she states that they suspect neuropathy is spreading and urinary sx may be related to that She has some incontinence that she cannot feel -she often does not make it to the bathroom although she does try and get up once an hour and sit for extended period of time she tries to stop drinking later in the evening and tries to also keep her self plenty of time in the evening to empty bladder before going to bed.  She notes increased urinary odor which she notices associated with drinking less fluids.  She is afraid to push fluids more because she knows that she will likely have more incontinence.  She is tried several types of briefs and absorbable pads and she asks if were able to prescribe these for her today.  She does note that urinary frequency comes and goes overall her incontinence is much worse especially urge incontinence. Urine sample today shows positive glucose and otherwise unremarkable Results for orders placed or performed in visit on 09/30/19  POCT urinalysis dipstick  Result Value Ref Range   Color, UA yellow    Clarity, UA clear    Glucose, UA Positive (A) Negative   Bilirubin, UA neg    Ketones, UA neg    Spec Grav, UA 1.015 1.010 - 1.025   Blood, UA neg    pH, UA 5.0 5.0 - 8.0   Protein, UA Negative  Negative   Urobilinogen, UA 0.2 0.2 or 1.0 E.U./dL   Nitrite, UA neg    Leukocytes, UA Negative Negative   Appearance clear    Odor strong    Patient denies hematuria, pelvic pain abdominal pain, flank pain, nausea, vomiting, fever, chills, sweats, decrease energy, appetite, or change in gait or balance relative to her baseline. She believes she was referred to urology in the past but she cannot remember where, reviewing chart today through the referrals tab but cannot see any past urological referral or evaluation   She wants Rx for briefs  [employer] assurance brand Large - works for her, tried several other brands that did not fit or did not work well. She also requests chucks/pads  She states DM is well controlled on metformin 500 TID tresiba and glimepiride Sugars usually run 120  She also notes that her ears are itchy and she has even put a close pin inside of it to itch her ear she is not currently on any allergy medication she does not have any outer ear swelling redness or purulent drainage     Patient Active Problem List   Diagnosis Date Noted  . History of below knee amputation, left ([place]) 07/30/2019  . Gangrene of left foot ([place]) 05/16/2019  . Subacute  osteomyelitis of left foot ([place])   . Charcot's joint of foot, left   . Post-operative state   . PAD (peripheral artery disease) ([place]) 12/05/2018  . Osteomyelitis of ankle or foot, acute, left ([place]) 12/05/2018  . MRSA bacteremia 12/05/2018  . Sepsis without acute organ dysfunction ([place])   . 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 BMI 36.0-36.9,adult   . Morbid obesity ([place]) 11/19/2017  . B12 deficiency 11/01/2017  . Diabetic foot ulcer ([place]) 12/14/2016  . Diabetic polyneuropathy associated with type 2 diabetes mellitus ([place]) 12/13/2015  . Lumbar stenosis with neurogenic claudication 09/02/2015  . Chronic constipation 07/13/2015  . OSA (obstructive sleep apnea) 06/25/2015  . Primary osteoarthritis  involving multiple joints 04/23/2015  . Hypertriglyceridemia 04/23/2015  . Asthma, mild intermittent 02/01/2015  . Type 2 diabetes mellitus with diabetic nephropathy ([place]) 12/31/2014  . Gastroesophageal reflux disease with esophagitis 12/31/2014  . Dysphagia 12/31/2014  . Bilateral carpal tunnel syndrome 12/01/2014  . Diverticulosis of colon (without mention of hemorrhage) 11/30/2012  . Essential hypertension, benign 11/28/2012      Current Outpatient Medications:  .  acetaminophen (TYLENOL) 500 MG tablet, Take 1,000 mg by mouth every 6 (six) hours as needed for mild pain. Maximum of 3,000 mg per day, Disp: , Rfl:  .  Albuterol Sulfate (PROAIR RESPICLICK) 564 (90 Base) MCG/ACT AEPB, Inhale 1-2 puffs into the lungs every 6 (six) hours as needed (Shortness of breath or wheezing)., Disp: 1 each, Rfl: 2 .  aspirin EC 325 MG tablet, Take 1 tablet (325 mg total) by mouth daily., Disp: 30 tablet, Rfl: 0 .  Chlorphen-Phenyleph-ASA (ALKA-SELTZER PLUS COLD PO), Take 2 tablets by mouth as needed (allergies). Day and night time, Disp: , Rfl:  .  DULoxetine (CYMBALTA) 60 MG capsule, Take 60 mg by mouth at bedtime., Disp: , Rfl:  .  fenofibrate (TRICOR) 145 MG tablet, Take 1 tablet (145 mg total) by mouth daily., Disp: 90 tablet, Rfl: 3 .  gabapentin (NEURONTIN) 400 MG capsule, Take 800 mg by mouth 3 (three) times daily. , Disp: , Rfl:  .  glimepiride (AMARYL) 2 MG tablet, Take 2 mg by mouth 2 (two) times daily. , Disp: , Rfl:  .  hydrochlorothiazide (HYDRODIURIL) 12.5 MG tablet, Take 1 tablet (12.5 mg total) by mouth daily., Disp: 90 tablet, Rfl: 3 .  Insulin Degludec (TRESIBA FLEXTOUCH) 200 UNIT/ML SOPN, Inject 20 Units into the skin at bedtime. , Disp: , Rfl:  .  metFORMIN (GLUCOPHAGE-XR) 500 MG 24 hr tablet, Take 500 mg by mouth 3 (three) times daily. , Disp: , Rfl:  .  pantoprazole (PROTONIX) 40 MG tablet, Take 1 tablet (40 mg total) by mouth daily as needed (Heartburn)., Disp: 90 tablet, Rfl: 2 .   Blood Glucose Monitoring Suppl (CONTOUR NEXT MONITOR) w/Device KIT, as directed. , Disp: , Rfl:  .  ULTICARE SHORT PEN NEEDLES 31G X 8 MM MISC, , Disp: , Rfl:    Allergies  Allergen Reactions  . Eggs Or Egg-Derived Products Swelling and Other (See Comments)    Angioedema  . Atorvastatin Other (See Comments)    Liver toxicity  . Flu Virus Vaccine Swelling    Arm swelled (site of injection)  . Latex Itching  . Pravastatin Itching and Rash  . Tape Rash and Other (See Comments)    Adhesive on foot pull off skin.  Ok to use paper tape.     Family History  Problem Relation Age of Onset  . Lung  cancer Father   . Hypertension Father   . Arthritis Father   . Other Mother        hardening of the arteries/renal cell carcenoma  . Hypertension Mother   . Stroke Mother   . Kidney cancer Mother   . Arthritis Brother   . Rheum arthritis Maternal Uncle   . Bladder Cancer Neg Hx      Social History   Socioeconomic History  . Marital status: Single    Spouse name: Not on file  . Number of children: 1  . Years of education: Not on file  . Highest education level: Not on file  Occupational History  . Occupation: histology Engineer, production: [employer]  Tobacco Use  . Smoking status: Never Smoker  . Smokeless tobacco: Never Used  Substance and Sexual Activity  . Alcohol use: Never  . Drug use: No  . Sexual activity: Not Currently    Comment: Hysterectomy  Other Topics Concern  . Not on file  Social History Narrative  . Not on file   Social Determinants of Health   Financial Resource Strain: Low Risk   . Difficulty of Paying Living Expenses: Not very hard  Food Insecurity: No Food Insecurity  . Worried About Charity fundraiser in the Last Year: Never true  . Ran Out of Food in the Last Year: Never true  Transportation Needs: No Transportation Needs  . Lack of Transportation (Medical): No  . Lack of Transportation (Non-Medical): No  Physical Activity: Sufficiently Active    . Days of Exercise per Week: 7 days  . Minutes of Exercise per Session: 30 min  Stress: No Stress Concern Present  . Feeling of Stress : Not at all  Social Connections: Somewhat Isolated  . Frequency of Communication with Friends and Family: More than three times a week  . Frequency of Social Gatherings with Friends and Family: Never  . Attends Religious Services: More than 4 times per year  . Active Member of Clubs or Organizations: No  . Attends Archivist Meetings: Never  . Marital Status: Never married  Intimate Partner Violence: Not At Risk  . Fear of Current or Ex-Partner: No  . Emotionally Abused: No  . Physically Abused: No  . Sexually Abused: No    Chart Review Today: I personally reviewed active problem list, medication list, allergies, family history, social history, health maintenance, notes from last encounter, lab results, imaging with the patient/caregiver today.   Review of Systems 10 Systems reviewed and are negative for acute change except as noted in the HPI.     Objective:   Vitals:   09/30/19 1108  BP: 124/82  Pulse: 99  Resp: 14  Temp: 97.9 F (36.6 C)  SpO2: 98%  Height: 5' (1.524 m)    Body mass index is 36.52 kg/m.  Physical Exam Vitals and nursing note reviewed.  Constitutional:      General: She is not in acute distress.    Appearance: She is obese. She is not ill-appearing, toxic-appearing or diaphoretic.  HENT:     Right Ear: Tympanic membrane and external ear normal. Swelling and tenderness present. There is no impacted cerumen.     Left Ear: Tympanic membrane and external ear normal. Swelling and tenderness present. There is no impacted cerumen.     Ears:     Comments: Mild bilateral external auditory canal edema and erythema worse on the left than on the right, purulent discharge, TM intact  bilaterally, no outer ear tenderness to palpation Cardiovascular:     Rate and Rhythm: Normal rate and regular rhythm.     Pulses:  Normal pulses.     Heart sounds: Normal heart sounds.  Pulmonary:     Effort: Pulmonary effort is normal.     Breath sounds: Normal breath sounds.  Abdominal:     General: Bowel sounds are normal.     Palpations: Abdomen is soft.     Comments: Soft obese abdomen no tenderness to palpation no guarding no rebound, no CVA tenderness bilaterally  Skin:    General: Skin is dry.     Coloration: Skin is not jaundiced or pale.     Findings: No erythema or rash.  Neurological:     Mental Status: She is alert. Mental status is at baseline.     Comments: In wheelchair today  Psychiatric:        Mood and Affect: Mood normal.        Behavior: Behavior normal.      Results for orders placed or performed in visit on 09/30/19  POCT urinalysis dipstick  Result Value Ref Range   Color, UA yellow    Clarity, UA clear    Glucose, UA Positive (A) Negative   Bilirubin, UA neg    Ketones, UA neg    Spec Grav, UA 1.015 1.010 - 1.025   Blood, UA neg    pH, UA 5.0 5.0 - 8.0   Protein, UA Negative Negative   Urobilinogen, UA 0.2 0.2 or 1.0 E.U./dL   Nitrite, UA neg    Leukocytes, UA Negative Negative   Appearance clear    Odor strong         Assessment & Plan:      ICD-10-CM   1. Urinary frequency  R35.0 POCT urinalysis dipstick    Urine Culture    Ambulatory referral to Urology    Incontinence Supply Disposable (ASSURANCE FITTED BRIEF LARGE) MISC   Will screen for UTI, no other symptoms of infection, possible urinary retention or neurogenic bladder?  2. Abnormal urine odor  R82.90 Urine Culture    Ambulatory referral to Urology    Incontinence Supply Disposable (ASSURANCE FITTED BRIEF LARGE) MISC  3. Urinary incontinence, unspecified type  R32 Ambulatory referral to Urology    Incontinence Supply Disposable (ASSURANCE FITTED BRIEF LARGE) MISC   Worsening urge incontinence will try and help order briefs and pads but I do not know if insurance covers this discussed with patient  4.  Otitis externa of both ears, unspecified chronicity, unspecified type  H60.93    Start allergy medications do not put anything in ear, can use Cortisporin sparingly for severe ear irritation or discomfort        Delsa Grana, PA-C 09/30/19 11:31 AM

## 2019-09-30 NOTE — Patient Instructions (Addendum)
Follow up with urology - they should call you in the next 1-2 weeks.     Urinary Frequency, Adult Urinary frequency means urinating more often than usual. You may urinate every 1-2 hours even though you drink a normal amount of fluid and do not have a bladder infection or condition. Although you urinate more often than normal, the total amount of urine produced in a day is normal. With urinary frequency, you may have an urgent need to urinate often. The stress and anxiety of needing to find a bathroom quickly can make this urge worse. This condition may go away on its own or you may need treatment at home. Home treatment may include bladder training, exercises, taking medicines, or making changes to your diet. Follow these instructions at home: Bladder health   Keep a bladder diary if told by your health care provider. Keep track of: ? What you eat and drink. ? How often you urinate. ? How much you urinate.  Follow a bladder training program if told by your health care provider. This may include: ? Learning to delay going to the bathroom. ? Double urinating (voiding). This helps if you are not completely emptying your bladder. ? Scheduled voiding.  Do Kegel exercises as told by your health care provider. Kegel exercises strengthen the muscles that help control urination, which may help the condition. Eating and drinking  If told by your health care provider, make diet changes, such as: ? Avoiding caffeine. ? Drinking fewer fluids, especially alcohol. ? Not drinking in the evening. ? Avoiding foods or drinks that may irritate the bladder. These include coffee, tea, soda, artificial sweeteners, citrus, tomato-based foods, and chocolate. ? Eating foods that help prevent or ease constipation. Constipation can make this condition worse. Your health care provider may recommend that you:  Drink enough fluid to keep your urine pale yellow.  Take over-the-counter or prescription  medicines.  Eat foods that are high in fiber, such as beans, whole grains, and fresh fruits and vegetables.  Limit foods that are high in fat and processed sugars, such as fried or sweet foods. General instructions  Take over-the-counter and prescription medicines only as told by your health care provider.  Keep all follow-up visits as told by your health care provider. This is important. Contact a health care provider if:  You start urinating more often.  You feel pain or irritation when you urinate.  You notice blood in your urine.  Your urine looks cloudy.  You develop a fever.  You begin vomiting. Get help right away if:  You are unable to urinate. Summary  Urinary frequency means urinating more often than usual. With urinary frequency, you may urinate every 1-2 hours even though you drink a normal amount of fluid and do not have a bladder infection or other bladder condition.  Your health care provider may recommend that you keep a bladder diary, follow a bladder training program, or make dietary changes.  If told by your health care provider, do Kegel exercises to strengthen the muscles that help control urination.  Take over-the-counter and prescription medicines only as told by your health care provider.  Contact a health care provider if your symptoms do not improve or get worse. This information is not intended to replace advice given to you by your health care provider. Make sure you discuss any questions you have with your health care provider. Document Revised: 11/29/2017 Document Reviewed: 11/29/2017 Elsevier Patient Education  [place].

## 2019-10-01 ENCOUNTER — Ambulatory Visit (INDEPENDENT_AMBULATORY_CARE_PROVIDER_SITE_OTHER): Payer: Medicare Other | Admitting: Physical Therapy

## 2019-10-01 ENCOUNTER — Encounter: Payer: Self-pay | Admitting: Physical Therapy

## 2019-10-01 DIAGNOSIS — R531 Weakness: Secondary | ICD-10-CM | POA: Diagnosis not present

## 2019-10-01 DIAGNOSIS — Z9181 History of falling: Secondary | ICD-10-CM

## 2019-10-01 DIAGNOSIS — R293 Abnormal posture: Secondary | ICD-10-CM

## 2019-10-01 DIAGNOSIS — M25662 Stiffness of left knee, not elsewhere classified: Secondary | ICD-10-CM

## 2019-10-01 DIAGNOSIS — R2681 Unsteadiness on feet: Secondary | ICD-10-CM

## 2019-10-01 DIAGNOSIS — R2689 Other abnormalities of gait and mobility: Secondary | ICD-10-CM

## 2019-10-01 NOTE — Therapy (Signed)
[facility] Physical Therapy [address] [place], [place], [address] Phone: [phone]   Fax:  [phone]  Physical Therapy Treatment  Patient Details  Name: Olivia Romero MRN: [id number] Date of Birth: [birth year] Referring Provider (PT): Meridee Score, MD   Encounter Date: 10/01/2019  PT End of Session - 10/01/19 1419    Visit Number  3    Number of Visits  25    Date for PT Re-Evaluation  12/21/19    Authorization Type  [employer] Medicare, $30 co-pay, no visit limit    PT Start Time  1315    PT Stop Time  1400    PT Time Calculation (min)  45 min    Equipment Utilized During Treatment  Gait belt    Activity Tolerance  Patient tolerated treatment well    Behavior During Therapy  [facility] for tasks assessed/performed       Past Medical History:  Diagnosis Date  . Anemia   . Arthritis   . Asthma   . Colon polyps    adenomatous  . Diabetes mellitus without complication ([place])   . Diabetic infection of left foot ([place]) 12/2018  . Diverticulosis of colon   . Esophagitis   . GERD (gastroesophageal reflux disease)   . Hemorrhoid    internal  . Hyperlipemia   . Hypertension   . IBS (irritable bowel syndrome)    no current prob - diet controlled  . Myalgia due to statin 11/19/2017  . Neuropathy   . Neuropathy of both feet   . Neuropathy of hand   . Pneumonia    x 4  . PONV (postoperative nausea and vomiting)    on some surgeries but not all procedures  . Skin ulcer of right ankle, limited to breakdown of skin ([place]) 12/31/2016   resolved per patient 05/14/19  . Sleep apnea    has had in the past lost 50 pounds and do longer uses cpap  . Statin intolerance 04/23/2015  . Stroke [facility]) 2009   mini stroke per patient  . Stroke-like episode 2009   TIA - mini stroke per patient  . 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 ([place]) 07/13/2015  . Uncontrolled type 2 diabetes mellitus with hyperglycemia ([place]) 11/01/2017    Past Surgical History:   Procedure Laterality Date  . ABDOMINAL HYSTERECTOMY    . AMPUTATION Left 12/06/2018   Procedure: PARTIAL AMPUTATION LEFT FOOT;  Surgeon: Edrick Kins, DPM;  Location: [place];  Service: Podiatry;  Laterality: Left;  . AMPUTATION Left 05/16/2019   Procedure: LEFT BELOW KNEE AMPUTATION;  Surgeon: Newt Minion, MD;  Location: [place];  Service: Orthopedics;  Laterality: Left;  . BACK SURGERY  09/02/2015  . BONE BIOPSY Left 12/06/2018   Procedure: Bone Biopsy;  Surgeon: Edrick Kins, DPM;  Location: [place];  Service: Podiatry;  Laterality: Left;  . CERVICAL FUSION    . CESAREAN SECTION  1986  . COLONOSCOPY N/A 11/30/2012   Procedure: COLONOSCOPY;  Surgeon: Irene Shipper, MD;  Location: [facility] ENDOSCOPY;  Service: Endoscopy;  Laterality: N/A;  . COLONOSCOPY WITH PROPOFOL N/A 01/03/2016   Procedure: COLONOSCOPY WITH PROPOFOL;  Surgeon: Manya Silvas, MD;  Location: [facility] ENDOSCOPY;  Service: Endoscopy;  Laterality: N/A;  . ESOPHAGOGASTRODUODENOSCOPY (EGD) WITH PROPOFOL N/A 01/14/2015   Procedure: ESOPHAGOGASTRODUODENOSCOPY (EGD) WITH PROPOFOL;  Surgeon: Manya Silvas, MD;  Location: [facility] ENDOSCOPY;  Service: Endoscopy;  Laterality: N/A;  . IRRIGATION AND DEBRIDEMENT FOOT Left 03/13/2018   Procedure: IRRIGATION  AND DEBRIDEMENT FOOT WITH BONE BIOPSY WITH MISONIX DEBRIDER;  Surgeon: Evelina Bucy, DPM;  Location: [place];  Service: Podiatry;  Laterality: Left;  . IRRIGATION AND DEBRIDEMENT FOOT Left 12/06/2018   Procedure: Irrigation And Debridement Foot;  Surgeon: Edrick Kins, DPM;  Location: [place];  Service: Podiatry;  Laterality: Left;  . LEG AMPUTATION BELOW KNEE Left   . LUMBAR WOUND DEBRIDEMENT N/A 10/01/2015   Procedure: LUMBAR WOUND DEBRIDEMENT;  Surgeon: Ashok Pall, MD;  Location: [place] [facility]URO ORS;  Service: Neurosurgery;  Laterality: N/A;  LUMBAR WOUND DEBRIDEMENT  . NASAL SINUS SURGERY    . SAVORY DILATION N/A 01/14/2015   Procedure: SAVORY DILATION;  Surgeon: Manya Silvas, MD;  Location:  [facility] ENDOSCOPY;  Service: Endoscopy;  Laterality: N/A;  . TONSILLECTOMY    . WISDOM TOOTH EXTRACTION      There were no vitals filed for this visit.  Subjective Assessment - 10/01/19 1407    Subjective  relays she was sore after last time but its better now.    Pertinent History  L TTA, DM, CVA, sleep apnea, PNA, neuropathy bil. 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, in community        [facility] Adult PT Treatment/Exercise - 10/01/19 0001      Transfers   Transfers  Sit to Stand;Stand to Sit;Stand Pivot Transfers    Sit to Stand  5: Supervision;With upper extremity assist;With armrests;From chair/3-in-1    Stand to Sit  5: Supervision;With upper extremity assist;With armrests;To chair/3-in-1    Stand Pivot Transfers  4: Min guard    Transfer via Lift Equipment  Other/comments   RW     Ambulation/Gait   Ambulation/Gait  Yes    Ambulation/Gait Assistance  4: Min assist    Ambulation/Gait Assistance Details  cues for sequence and technique for both 3 point pattern and 4 point pattern (she preferred 3 point pattern with step to gait using bilat crutches    Ambulation Distance (Feet)  15 Feet   X3   Assistive device  Crutches    Ambulation Surface  Level;Indoor    Ramp  3: Mod assist    Ramp Details (indicate cue type and reason)  went down ramp using RW, verbal and demo for cues and sequencing. Did not yet perform ascending curb as she was too tired by this point    Curb  4: Min assist    Curb Details (indicate cue type and reason)  up/down using RW up with Rt leg first, down with Lt leg first, verbal and demo for cues and sequencing. started with 4.5 inch curb, then progressed to 6.5 inch curb then progressed to 8.5 inch curb      Prosthetics   Current  prosthetic wear tolerance (#hours/day)   3-4 hours 2Xday               PT Short Term Goals - 09/22/19 1341      PT SHORT TERM GOAL #1   Title  Patient demonstrates proper donning & verbalizes proper cleaning of prosthesis. (All STGs Target Date: 10/23/2019)    Time  1    Period  Months    Status  New    Target Date  10/23/19      PT SHORT TERM  GOAL #2   Title  Patient tolerates prosthesis wear >10 hrs total/ day without skin or limb pain issues.    Time  1    Period  Months    Status  New    Target Date  10/23/19      PT SHORT TERM GOAL #3   Title  Patient standing balance static with eyes open 20 seconds within 3 attempts and reaches 7" anteriorly with RW support with supervision.    Time  1    Period  Months    Status  New    Target Date  10/23/19      PT SHORT TERM GOAL #4   Title  Patient ambulates 100' with RW & prosthesis with supervision.    Time  1    Period  Months    Status  New    Target Date  10/23/19      PT SHORT TERM GOAL #5   Title  patient negotiates ramps & curbs with rolling walker & prosthesis with supervision.    Time  1    Period  Months    Status  New    Target Date  10/23/19        PT Long Term Goals - 09/22/19 1335      PT LONG TERM GOAL #1   Title  Patient verbalizes & demonstrates proper prosthetic care to enable safe utilization of prosthesis.  (All LTGs Target Date 12/19/2019)    Time  3    Period  Months    Status  New    Target Date  12/19/19      PT LONG TERM GOAL #2   Title  Patient tolerates prosthesis wear & right ankle orthosis wear >90% of awake hours to enable function throughout her day.    Time  3    Period  Months    Status  New    Target Date  12/19/19      PT LONG TERM GOAL #3   Title  Standing balance: static without UE support eyes open for 30 seconds and dynamic with UE support reaching 10", pick up object from floor, managing clothes & static eyes closed 2 minutes safely modified independent.    Time  3     Period  Months    Status  New    Target Date  12/19/19      PT LONG TERM GOAL #4   Title  Patient ambulates 300' including grass with cane or less and prosthesis modified independent for community access.    Time  3    Period  Months    Status  New    Target Date  12/19/19      PT LONG TERM GOAL #5   Title  Patient negotiates ramps & curbs with cane or less & prosthesis modified independent for community access.    Time  3    Period  Months    Status  New    Target Date  12/19/19            Plan - 10/01/19 1419    Clinical Impression Statement  Session focused on funcitonal mobility, gait training, and curb/ramp negotiation. She was able to ambulate with bilat crutches with step to pattern but requires min A thus PT recommending she not try this at home yet and continue with RW at home until she becomes more safe with crutches in clinic.    Personal Factors and Comorbidities  Comorbidity  3+;Fitness;Time since onset of injury/illness/exacerbation    Comorbidities  L TTA, DM, CVA, sleep apnea, PNA, neuropathy bil. feet and hand, HTN, s/p cervical fusion, s/p back surgery, IBS, asthma, Covid in Jan 2021    Examination-Activity Limitations  Hygiene/Grooming;Locomotion Level;Reach Overhead;Stairs;Stand;Transfers    Examination-Participation Restrictions  Recruitment consultant  Evolving/Moderate complexity    Rehab Potential  Good    PT Frequency  2x / week    PT Duration  12 weeks    PT Treatment/Interventions  ADLs/Self Care Home Management;DME Instruction;Gait training;Stair training;Functional mobility training;Therapeutic activities;Therapeutic exercise;Balance training;Neuromuscular re-education;Patient/family education;Orthotic Fit/Training;Prosthetic Training;Vestibular    PT Next Visit Plan  continue to work of gait with crutches but use RW if she appears unsafe. Need to work on ascending/descending ramp again with RW. Progress  balance/standing tolerance as able.    PT Home Exercise Plan  at sink for mini squats with UE support, weight shifts with UE support, and balance feet apart no Ue support all with chair behind her    Consulted and Agree with Plan of Care  Patient       Patient will benefit from skilled therapeutic intervention in order to improve the following deficits and impairments:  Abnormal gait, Decreased activity tolerance, Decreased balance, Decreased endurance, Decreased knowledge of use of DME, Decreased mobility, Decreased range of motion, Decreased safety awareness, Decreased strength, Increased edema, Impaired flexibility, Impaired sensation, Postural dysfunction, Prosthetic Dependency, Obesity, Pain  Visit Diagnosis: Other abnormalities of gait and mobility  Unsteadiness on feet  Abnormal posture  Weakness generalized  History of fall  Stiffness of left knee, not elsewhere classified     Problem List Patient Active Problem List   Diagnosis Date Noted  . History of below knee amputation, left ([place]) 07/30/2019  . Gangrene of left foot ([place]) 05/16/2019  . Subacute osteomyelitis of left foot ([place])   . Charcot's joint of foot, left   . Post-operative state   . PAD (peripheral artery disease) ([place]) 12/05/2018  . Osteomyelitis of ankle or foot, acute, left ([place]) 12/05/2018  . MRSA bacteremia 12/05/2018  . Sepsis without acute organ dysfunction ([place])   . Charcot's joint of foot due to diabetes ([place]) 09/18/2018  . BMI 36.0-36.9,adult   . Morbid obesity ([place]) 11/19/2017  . B12 deficiency 11/01/2017  . Diabetic foot ulcer ([place]) 12/14/2016  . Diabetic polyneuropathy associated with type 2 diabetes mellitus ([place]) 12/13/2015  . Lumbar stenosis with neurogenic claudication 09/02/2015  . Chronic constipation 07/13/2015  . OSA (obstructive sleep apnea) 06/25/2015  . 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/23/2015  . Hypertriglyceridemia 04/23/2015  . Asthma, mild intermittent  02/01/2015  . Type 2 diabetes mellitus with diabetic nephropathy ([place]) 12/31/2014  . Gastroesophageal reflux disease with esophagitis 12/31/2014  . Dysphagia 12/31/2014  . Bilateral carpal tunnel syndrome 12/01/2014  . Diverticulosis of colon (without mention of hemorrhage) 11/30/2012  . Essential hypertension, benign 11/28/2012    Silvestre Mesi 10/01/2019, 2:24 PM  [facility] Physical Therapy [address] [place], [place], [address] Phone: [phone]   Fax:  [phone]  Name: ARMONY WEINREICH MRN: [id number] Date of Birth: [birth year]

## 2019-10-02 LAB — URINE CULTURE
MICRO NUMBER:: 10411455
SPECIMEN QUALITY:: ADEQUATE

## 2019-10-02 MED ORDER — CEPHALEXIN 500 MG PO CAPS
500.0000 mg | ORAL_CAPSULE | Freq: Four times a day (QID) | ORAL | 0 refills | Status: DC
Start: 2019-10-02 — End: 2019-10-07

## 2019-10-02 NOTE — Addendum Note (Signed)
Addended by: Delsa Grana on: 10/02/2019 04:24 PM   Modules accepted: Orders

## 2019-10-03 ENCOUNTER — Ambulatory Visit (INDEPENDENT_AMBULATORY_CARE_PROVIDER_SITE_OTHER): Payer: Medicare Other | Admitting: Physical Therapy

## 2019-10-03 ENCOUNTER — Telehealth: Payer: Self-pay | Admitting: Physician Assistant

## 2019-10-03 ENCOUNTER — Telehealth: Payer: Self-pay | Admitting: Family Medicine

## 2019-10-03 ENCOUNTER — Other Ambulatory Visit: Payer: Self-pay

## 2019-10-03 DIAGNOSIS — R2689 Other abnormalities of gait and mobility: Secondary | ICD-10-CM | POA: Diagnosis not present

## 2019-10-03 DIAGNOSIS — R531 Weakness: Secondary | ICD-10-CM | POA: Diagnosis not present

## 2019-10-03 DIAGNOSIS — R293 Abnormal posture: Secondary | ICD-10-CM | POA: Diagnosis not present

## 2019-10-03 DIAGNOSIS — M25662 Stiffness of left knee, not elsewhere classified: Secondary | ICD-10-CM

## 2019-10-03 DIAGNOSIS — R2681 Unsteadiness on feet: Secondary | ICD-10-CM

## 2019-10-03 DIAGNOSIS — Z9181 History of falling: Secondary | ICD-10-CM

## 2019-10-03 NOTE — Telephone Encounter (Signed)
RX for cephALEXin (KEFLEX) 500 MG capsule  needs to be sent to [facility] instead of [facility]/ please send today due to pharmacy being closed on the weekend

## 2019-10-03 NOTE — Telephone Encounter (Signed)
Patient called stating that she is no longer able to get transportation from [place] to [place] for PT and is wanting to know if she can be set up for in home PT.  CB#[phone].  Thank you.

## 2019-10-03 NOTE — Therapy (Signed)
[facility] Physical Therapy [address] [place], [place], [address] Phone: [phone]   Fax:  [phone]  Physical Therapy Treatment  Patient Details  Name: Olivia Romero MRN: [id number] Date of Birth: [birth year] Referring Provider (PT): Meridee Score, MD   Encounter Date: 10/03/2019  PT End of Session - 10/03/19 1213    Visit Number  4    Number of Visits  25    Date for PT Re-Evaluation  12/21/19    Authorization Type  [employer] Medicare, $30 co-pay, no visit limit    PT Start Time  0930    PT Stop Time  1016    PT Time Calculation (min)  46 min    Equipment Utilized During Treatment  Gait belt    Activity Tolerance  Patient tolerated treatment well    Behavior During Therapy  [facility] for tasks assessed/performed       Past Medical History:  Diagnosis Date  . Anemia   . Arthritis   . Asthma   . Colon polyps    adenomatous  . Diabetes mellitus without complication ([place])   . Diabetic infection of left foot ([place]) 12/2018  . Diverticulosis of colon   . Esophagitis   . GERD (gastroesophageal reflux disease)   . Hemorrhoid    internal  . Hyperlipemia   . Hypertension   . IBS (irritable bowel syndrome)    no current prob - diet controlled  . Myalgia due to statin 11/19/2017  . Neuropathy   . Neuropathy of both feet   . Neuropathy of hand   . Pneumonia    x 4  . PONV (postoperative nausea and vomiting)    on some surgeries but not all procedures  . Skin ulcer of right ankle, limited to breakdown of skin ([place]) 12/31/2016   resolved per patient 05/14/19  . Sleep apnea    has had in the past lost 50 pounds and do longer uses cpap  . Statin intolerance 04/23/2015  . Stroke [facility]) 2009   mini stroke per patient  . Stroke-like episode 2009   TIA - mini stroke per patient  . 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 Uncontrolled type 2 diabetes mellitus with hyperglycemia ([place]) 11/01/2017    Past Surgical History:   Procedure Laterality Date  . ABDOMINAL HYSTERECTOMY    . AMPUTATION Left 12/06/2018   Procedure: PARTIAL AMPUTATION LEFT FOOT;  Surgeon: Edrick Kins, DPM;  Location: [place];  Service: Podiatry;  Laterality: Left;  . AMPUTATION Left 05/16/2019   Procedure: LEFT BELOW KNEE AMPUTATION;  Surgeon: Newt Minion, MD;  Location: [place];  Service: Orthopedics;  Laterality: Left;  . BACK SURGERY  09/02/2015  . BONE BIOPSY Left 12/06/2018   Procedure: Bone Biopsy;  Surgeon: Edrick Kins, DPM;  Location: [place];  Service: Podiatry;  Laterality: Left;  . CERVICAL FUSION    . CESAREAN SECTION  1986  . COLONOSCOPY N/A 11/30/2012   Procedure: COLONOSCOPY;  Surgeon: Irene Shipper, MD;  Location: [facility] ENDOSCOPY;  Service: Endoscopy;  Laterality: N/A;  . COLONOSCOPY WITH PROPOFOL N/A 01/03/2016   Procedure: COLONOSCOPY WITH PROPOFOL;  Surgeon: Manya Silvas, MD;  Location: [facility] ENDOSCOPY;  Service: Endoscopy;  Laterality: N/A;  . ESOPHAGOGASTRODUODENOSCOPY (EGD) WITH PROPOFOL N/A 01/14/2015   Procedure: ESOPHAGOGASTRODUODENOSCOPY (EGD) WITH PROPOFOL;  Surgeon: Manya Silvas, MD;  Location: [facility] ENDOSCOPY;  Service: Endoscopy;  Laterality: N/A;  . IRRIGATION AND DEBRIDEMENT FOOT Left 03/13/2018   Procedure: IRRIGATION  AND DEBRIDEMENT FOOT WITH BONE BIOPSY WITH MISONIX DEBRIDER;  Surgeon: Evelina Bucy, DPM;  Location: [place];  Service: Podiatry;  Laterality: Left;  . IRRIGATION AND DEBRIDEMENT FOOT Left 12/06/2018   Procedure: Irrigation And Debridement Foot;  Surgeon: Edrick Kins, DPM;  Location: [place];  Service: Podiatry;  Laterality: Left;  . LEG AMPUTATION BELOW KNEE Left   . LUMBAR WOUND DEBRIDEMENT N/A 10/01/2015   Procedure: LUMBAR WOUND DEBRIDEMENT;  Surgeon: Ashok Pall, MD;  Location: [place] [facility]URO [facility]S;  Service: Neurosurgery;  Laterality: N/A;  LUMBAR WOUND DEBRIDEMENT  . NASAL SINUS SURGERY    . SAVORY DILATION N/A 01/14/2015   Procedure: SAVORY DILATION;  Surgeon: Manya Silvas, MD;  Location:  [facility] ENDOSCOPY;  Service: Endoscopy;  Laterality: N/A;  . TONSILLECTOMY    . WISDOM TOOTH EXTRACTION      There were no vitals filed for this visit.  Subjective Assessment - 10/03/19 1158    Subjective  relays no new complaints, no issues with skin breakdown, wants to be able to visit her friend who has about 3 steps with one handrail    Pertinent History  L TTA, DM, CVA, sleep apnea, PNA, neuropathy bil. 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  No/denies         [facility] Adult PT Treatment/Exercise - 10/03/19 0001      Transfers   Transfers  Sit to Stand;Stand to Sit;Stand Pivot Transfers    Sit to Stand  5: Supervision;With upper extremity assist;With armrests;From chair/3-in-1    Stand to Sit  5: Supervision;With upper extremity assist;With armrests;To chair/3-in-1    Stand Pivot Transfers  4: Min guard    Transfer via Lift Equipment  --   crutches     Ambulation/Gait   Ambulation/Gait  Yes    Ambulation/Gait Assistance  4: Min guard    Ambulation/Gait Assistance Details  cues for step length and trying to clear her Rt foot past left foot    Ambulation Distance (Feet)  30 Feet   also 3 rounds of 15 ft   Ambulation Surface  Level;Indoor      Therapeutic Activites    Therapeutic Activities  Other Therapeutic Activities    Other Therapeutic Activities  worked on going up step with one handrail in bars and crutches placed on one side using 6 inch step stepping up with Rt leg and down with Lt leg X 3 reps, then progressed to using one handrail and PT HHA on the other side for 3 more reps then had her switch to the other side handrail and PT HHA for 3 more reps      Prosthetics   Prosthetic Care Comments   had her add more  ply socks as she had too much ER of presthesis in sitting/standing at beginning of session, she only had one ply sock and this was increased to 6 ply with improved suspension    Current prosthetic wear tolerance (#hours/day)   3-4 hours 2Xday               PT Short Term Goals - 09/22/19 1341      PT SHORT TERM GOAL #1   Title  Patient demonstrates proper donning & verbalizes proper  cleaning of prosthesis. (All STGs Target Date: 10/23/2019)    Time  1    Period  Months    Status  New    Target Date  10/23/19      PT SHORT TERM GOAL #2   Title  Patient tolerates prosthesis wear >10 hrs total/ day without skin or limb pain issues.    Time  1    Period  Months    Status  New    Target Date  10/23/19      PT SHORT TERM GOAL #3   Title  Patient standing balance static with eyes open 20 seconds within 3 attempts and reaches 7" anteriorly with RW support with supervision.    Time  1    Period  Months    Status  New    Target Date  10/23/19      PT SHORT TERM GOAL #4   Title  Patient ambulates 100' with RW & prosthesis with supervision.    Time  1    Period  Months    Status  New    Target Date  10/23/19      PT SHORT TERM GOAL #5   Title  patient negotiates ramps & curbs with rolling walker & prosthesis with supervision.    Time  1    Period  Months    Status  New    Target Date  10/23/19        PT Long Term Goals - 09/22/19 1335      PT LONG TERM GOAL #1   Title  Patient verbalizes & demonstrates proper prosthetic care to enable safe utilization of prosthesis.  (All LTGs Target Date 12/19/2019)    Time  3    Period  Months    Status  New    Target Date  12/19/19      PT LONG TERM GOAL #2   Title  Patient tolerates prosthesis wear & right ankle orthosis wear >90% of awake hours to enable function throughout her day.    Time  3    Period  Months    Status  New    Target Date  12/19/19      PT LONG TERM GOAL #3   Title  Standing balance: static without UE  support eyes open for 30 seconds and dynamic with UE support reaching 10", pick up object from floor, managing clothes & static eyes closed 2 minutes safely modified independent.    Time  3    Period  Months    Status  New    Target Date  12/19/19      PT LONG TERM GOAL #4   Title  Patient ambulates 300' including grass with cane or less and prosthesis modified independent for community access.    Time  3    Period  Months    Status  New    Target Date  12/19/19      PT LONG TERM GOAL #5   Title  Patient negotiates ramps & curbs with cane or less & prosthesis modified independent for community access.    Time  3    Period  Months    Status  New    Target Date  12/19/19            Plan - 10/03/19 1213    Clinical Impression Statement  She wants to be able to visit her friend who has 3 steps and one handrail but she is  unsure what side handrail is so practiced step ups with one handrail and crutches to one side progressed to one handrail and HHA on other side. Also continued to practice gait training with crutches and able to progress her step length some with this.    Personal Factors and Comorbidities  Comorbidity 3+;Fitness;Time since onset of injury/illness/exacerbation    Comorbidities  L TTA, DM, CVA, sleep apnea, PNA, neuropathy bil. feet and hand, HTN, s/p cervical fusion, s/p back surgery, IBS, asthma, Covid in Jan 2021    Examination-Activity Limitations  Hygiene/Grooming;Locomotion Level;Reach Overhead;Stairs;Stand;Transfers    Examination-Participation Restrictions  Recruitment consultant  Evolving/Moderate complexity    Rehab Potential  Good    PT Frequency  2x / week    PT Duration  12 weeks    PT Treatment/Interventions  ADLs/Self Care Home Management;DME Instruction;Gait training;Stair training;Functional mobility training;Therapeutic activities;Therapeutic exercise;Balance training;Neuromuscular  re-education;Patient/family education;Orthotic Fit/Training;Prosthetic Training;Vestibular    PT Next Visit Plan  standing balance activities, continue to work of gait with crutches but use RW if she appears unsafe. Need to work on ascending/descending ramp, curb, stairs. Progress balance/standing tolerance as able.    PT Home Exercise Plan  at sink for mini squats with UE support, weight shifts with UE support, and balance feet apart no Ue support all with chair behind her    Consulted and Agree with Plan of Care  Patient       Patient will benefit from skilled therapeutic intervention in order to improve the following deficits and impairments:  Abnormal gait, Decreased activity tolerance, Decreased balance, Decreased endurance, Decreased knowledge of use of DME, Decreased mobility, Decreased range of motion, Decreased safety awareness, Decreased strength, Increased edema, Impaired flexibility, Impaired sensation, Postural dysfunction, Prosthetic Dependency, Obesity, Pain  Visit Diagnosis: Other abnormalities of gait and mobility  Unsteadiness on feet  Abnormal posture  Weakness generalized  History of fall  Stiffness of left knee, not elsewhere classified     Problem List Patient Active Problem List   Diagnosis Date Noted  . History of below knee amputation, left ([place]) 07/30/2019  . Gangrene of left foot ([place]) 05/16/2019  . Subacute osteomyelitis of left foot ([place])   . Charcot's joint of foot, left   . Post-operative state   . PAD (peripheral artery disease) ([place]) 12/05/2018  . Osteomyelitis of ankle or foot, acute, left ([place]) 12/05/2018  . MRSA bacteremia 12/05/2018  . Sepsis without acute organ dysfunction ([place])   . Charcot's joint of foot due to diabetes ([place]) 09/18/2018  . BMI 36.0-36.9,adult   . Morbid obesity ([place]) 11/19/2017  . B12 deficiency 11/01/2017  . Diabetic foot ulcer ([place]) 12/14/2016  . Diabetic polyneuropathy associated with type 2 diabetes mellitus ([place])  12/13/2015  . Lumbar stenosis with neurogenic claudication 09/02/2015  . Chronic constipation 07/13/2015  . OSA (obstructive sleep apnea) 06/25/2015  . 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/23/2015  . Hypertriglyceridemia 04/23/2015  . Asthma, mild intermittent 02/01/2015  . Type 2 diabetes mellitus with diabetic nephropathy ([place]) 12/31/2014  . Gastroesophageal reflux disease with esophagitis 12/31/2014  . Dysphagia 12/31/2014  . Bilateral carpal tunnel syndrome 12/01/2014  . Diverticulosis of colon (without mention of hemorrhage) 11/30/2012  . Essential hypertension, benign 11/28/2012    Olivia Romero 10/03/2019, 12:18 PM  [facility] Physical Therapy [address] [place], [place], [address] Phone: [phone]   Fax:  [phone]  Name: Olivia Romero MRN: [id number] Date of Birth: 28-Dec-1955

## 2019-10-06 ENCOUNTER — Encounter: Payer: Medicare Other | Admitting: Physical Therapy

## 2019-10-06 NOTE — Telephone Encounter (Signed)
Will have someone to pick up at [facility]

## 2019-10-07 ENCOUNTER — Other Ambulatory Visit: Payer: Self-pay | Admitting: Family Medicine

## 2019-10-07 ENCOUNTER — Telehealth: Payer: Self-pay | Admitting: Emergency Medicine

## 2019-10-07 MED ORDER — CEPHALEXIN 500 MG PO CAPS: 500.0000 mg | ORAL_CAPSULE | Freq: Four times a day (QID) | ORAL | 0 refills | Status: AC

## 2019-10-07 NOTE — Telephone Encounter (Signed)
Patient called and stated she never got antibiotic for bladder infection. It should go to [facility]. Patient thougth it may have been sent to [facility] but there was nothing there

## 2019-10-07 NOTE — Telephone Encounter (Signed)
Sent to pharmacy requested (previous rx sent to wrong pharmacy per pt.) Requested Prescriptions  Pending Prescriptions Disp Refills  . cephALEXin (KEFLEX) 500 MG capsule 20 capsule 0    Sig: Take 1 capsule (500 mg total) by mouth 4 (four) times daily for 5 days.     Off-Protocol Failed - 10/07/2019  9:54 AM      Failed - Medication not assigned to a protocol, review manually.      Passed - Valid encounter within last 12 months    Recent Outpatient Visits          1 week ago Urinary frequency   [facility] Delsa Grana, PA-C   2 months ago S/P BKA (below knee amputation), left [facility])   [facility] Delsa Grana, PA-C   5 months ago Cellulitis of little finger   [place], [place]   5 months ago Fall, subsequent encounter   [place], FNP   10 months ago Diabetic polyneuropathy associated with type 2 diabetes mellitus [facility])   [place], NP      Future Appointments            In 1 week MacDiarmid, Nicki Reaper, [place]   In 1 month Persons, Bevely Palmer, [place]   In 1 month Uvaldo Rising, Astrid Divine, [facility], [place]   In 10 months  [facility], [facility]

## 2019-10-07 NOTE — Telephone Encounter (Signed)
Patient requesting call back to discuss further.

## 2019-10-07 NOTE — Telephone Encounter (Signed)
Looks like resent already

## 2019-10-07 NOTE — Telephone Encounter (Signed)
Medication Refill - Medication: cephALEXin (KEFLEX) 500 MG capsule  This was sent to the wrong pharmacy, please reroute to correct pharmacy listed below   Has the patient contacted their pharmacy? Yes.   (Agent: If no, request that the patient contact the pharmacy for the refill.) (Agent: If yes, when and what did the pharmacy advise?)  Preferred Pharmacy (with phone number or street name):  [facility] [place]  [place] [address]  Phone: [phone] Fax: [phone]     Agent: Please be advised that RX refills may take up to 3 business days. We ask that you follow-up with your pharmacy.

## 2019-10-07 NOTE — Telephone Encounter (Signed)
Copied from [place] [id number]. Topic: General - Inquiry >> Oct 06, 2019 11:41 AM Alease Frame wrote: Reason for CRM: Pt is requesting a call back from a nurse regarding medication supplies . Please advise

## 2019-10-08 ENCOUNTER — Other Ambulatory Visit: Payer: Self-pay

## 2019-10-08 DIAGNOSIS — R350 Frequency of micturition: Secondary | ICD-10-CM

## 2019-10-08 DIAGNOSIS — R32 Unspecified urinary incontinence: Secondary | ICD-10-CM

## 2019-10-08 DIAGNOSIS — R8290 Unspecified abnormal findings in urine: Secondary | ICD-10-CM

## 2019-10-08 MED ORDER — ASSURANCE FITTED BRIEF LARGE MISC: 5 refills | Status: DC

## 2019-10-08 NOTE — Telephone Encounter (Signed)
I called and sw pt and therapy has put her in touch with a transportation service. She willl continue to come to our office for therapy.

## 2019-10-09 ENCOUNTER — Telehealth: Payer: Self-pay | Admitting: Family Medicine

## 2019-10-09 ENCOUNTER — Encounter: Payer: Medicare Other | Admitting: Physical Therapy

## 2019-10-09 NOTE — Telephone Encounter (Signed)
   Olivia Romero DOB: [birth year] MRN: [id number]   RIDER WAIVER AND RELEASE OF LIABILITY  For purposes of improving physical access to our facilities, [facility] is pleased to partner with third parties to provide [place] patients or other authorized individuals the option of convenient, on-demand ground transportation services (the [place]") through use of the technology service that enables users to request on-demand ground transportation from independent third-party providers.  By opting to use and accept these [employer], I, the undersigned, hereby agree on behalf of myself, and on behalf of any minor child using the [employer] for whom I am the parent or legal guardian, as follows:  1. Government social research officer provided to me are provided by independent third-party transportation providers who are not [employer] or employees and who are unaffiliated with [employer]. 2. [facility] is neither a transportation carrier nor a common or public carrier. 3. [facility] has no control over the quality or safety of the transportation that occurs as a result of the [employer]. 4. [facility] cannot guarantee that any third-party transportation provider will complete any arranged transportation service. 5. [facility] makes no representation, warranty, or guarantee regarding the reliability, timeliness, quality, safety, suitability, or availability of any of the Transport Services or that they will be error free. 6. I fully understand that traveling by vehicle involves risks and dangers of serious bodily injury, including permanent disability, paralysis, and death. I agree, on behalf of myself and on behalf of any minor child using the Transport Services for whom I am the parent or legal guardian, that the entire risk arising out of my use of the [employer] remains solely with me, to the maximum extent permitted under applicable law. 7. The [employer] are provided "as is" and "as available." [facility] disclaims all representations and warranties, express, implied or statutory, not expressly set out in these terms, including the implied warranties of merchantability and fitness for a particular purpose. 8. I hereby waive and release [facility], its agents, employees, officers, directors, representatives, insurers, attorneys, assigns, successors, subsidiaries, and affiliates from any and all past, present, or future claims, demands, liabilities, actions, causes of action, or suits of any kind directly or indirectly arising from acceptance and use of the [employer]. 9. I further waive and release [facility] and its affiliates from all present and future liability and responsibility for any injury or death to persons or damages to property caused by or related to the use of the [employer]. 10. I have read this Waiver and Release of Liability, and I understand the terms used in it and their legal significance. This Waiver is freely and voluntarily given with the understanding that my right (as well as the right of any minor child for whom I am the parent or legal guardian using the [employer]) to legal recourse against [place] in connection with the [employer] is knowingly surrendered in return for use of these services.   I attest that I read the consent document to Olivia Romero, gave Olivia Romero the opportunity to ask questions and answered the questions asked (if any). I affirm that Olivia Romero then provided consent for she's participation in this program.     Olivia Romero

## 2019-10-10 ENCOUNTER — Telehealth: Payer: Self-pay | Admitting: Family Medicine

## 2019-10-10 NOTE — Telephone Encounter (Signed)
Tamera  from [place] is calling to follow up on referral regarding incontant supplies. The [facility] is Medicaid based. Patient has medicare. Please advise [phone]

## 2019-10-13 ENCOUNTER — Other Ambulatory Visit: Payer: Self-pay

## 2019-10-13 ENCOUNTER — Encounter: Payer: Self-pay | Admitting: Physical Therapy

## 2019-10-13 ENCOUNTER — Ambulatory Visit (INDEPENDENT_AMBULATORY_CARE_PROVIDER_SITE_OTHER): Payer: Medicare Other | Admitting: Physical Therapy

## 2019-10-13 DIAGNOSIS — Z9181 History of falling: Secondary | ICD-10-CM

## 2019-10-13 DIAGNOSIS — R531 Weakness: Secondary | ICD-10-CM

## 2019-10-13 DIAGNOSIS — R2681 Unsteadiness on feet: Secondary | ICD-10-CM | POA: Diagnosis not present

## 2019-10-13 DIAGNOSIS — R293 Abnormal posture: Secondary | ICD-10-CM

## 2019-10-13 DIAGNOSIS — M25662 Stiffness of left knee, not elsewhere classified: Secondary | ICD-10-CM

## 2019-10-13 DIAGNOSIS — R2689 Other abnormalities of gait and mobility: Secondary | ICD-10-CM

## 2019-10-13 NOTE — Telephone Encounter (Signed)
incontinent supplies sent to [facility] per pt and ins

## 2019-10-13 NOTE — Therapy (Signed)
[facility] Physical Therapy [address] [place], [place], [address] Phone: [phone]   Fax:  [phone]  Physical Therapy Treatment  Patient Details  Name: Olivia Romero MRN: [id number] Date of Birth: February 17, 1956 Referring Provider (PT): Meridee Score, MD   Encounter Date: 10/13/2019  PT End of Session - 10/13/19 1455    Visit Number  5    Number of Visits  25    Date for PT Re-Evaluation  12/21/19    Authorization Type  [employer] Medicare, $30 co-pay, no visit limit    PT Start Time  1455    PT Stop Time  1555    PT Time Calculation (min)  60 min    Equipment Utilized During Treatment  Gait belt    Activity Tolerance  Patient tolerated treatment well    Behavior During Therapy  [facility]: Lambert Rhonda W for tasks assessed/performed       Past Medical History:  Diagnosis Date  . Anemia   . Arthritis   . Asthma   . Colon polyps    adenomatous  . Diabetes mellitus without complication ([place])   . Diabetic infection of left foot ([place]) 12/2018  . Diverticulosis of colon   . Esophagitis   . GERD (gastroesophageal reflux disease)   . Hemorrhoid    internal  . Hyperlipemia   . Hypertension   . IBS (irritable bowel syndrome)    no current prob - diet controlled  . Myalgia due to statin 11/19/2017  . Neuropathy   . Neuropathy of both feet   . Neuropathy of hand   . Pneumonia    x 4  . PONV (postoperative nausea and vomiting)    on some surgeries but not all procedures  . Skin ulcer of right ankle, limited to breakdown of skin ([place]) 12/31/2016   resolved per patient 05/14/19  . Sleep apnea    has had in the past lost 50 pounds and do longer uses cpap  . Statin intolerance 04/23/2015  . Stroke [facility]) 2009   mini stroke per patient  . Stroke-like episode 2009   TIA - mini stroke per patient  . 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 ([place]) 07/13/2015  . Uncontrolled type 2 diabetes mellitus with hyperglycemia ([place]) 11/01/2017    Past Surgical History:   Procedure Laterality Date  . ABDOMINAL HYSTERECTOMY    . AMPUTATION Left 12/06/2018   Procedure: PARTIAL AMPUTATION LEFT FOOT;  Surgeon: Edrick Kins, DPM;  Location: [place];  Service: Podiatry;  Laterality: Left;  . AMPUTATION Left 05/16/2019   Procedure: LEFT BELOW KNEE AMPUTATION;  Surgeon: Newt Minion, MD;  Location: [place];  Service: Orthopedics;  Laterality: Left;  . BACK SURGERY  09/02/2015  . BONE BIOPSY Left 12/06/2018   Procedure: Bone Biopsy;  Surgeon: Edrick Kins, DPM;  Location: [place];  Service: Podiatry;  Laterality: Left;  . CERVICAL FUSION    . CESAREAN SECTION  1986  . COLONOSCOPY N/A 11/30/2012   Procedure: COLONOSCOPY;  Surgeon: Irene Shipper, MD;  Location: [facility] ENDOSCOPY;  Service: Endoscopy;  Laterality: N/A;  . COLONOSCOPY WITH PROPOFOL N/A 01/03/2016   Procedure: COLONOSCOPY WITH PROPOFOL;  Surgeon: Manya Silvas, MD;  Location: [facility] ENDOSCOPY;  Service: Endoscopy;  Laterality: N/A;  . ESOPHAGOGASTRODUODENOSCOPY (EGD) WITH PROPOFOL N/A 01/14/2015   Procedure: ESOPHAGOGASTRODUODENOSCOPY (EGD) WITH PROPOFOL;  Surgeon: Manya Silvas, MD;  Location: [facility] ENDOSCOPY;  Service: Endoscopy;  Laterality: N/A;  . IRRIGATION AND DEBRIDEMENT FOOT Left 03/13/2018   Procedure: IRRIGATION  AND DEBRIDEMENT FOOT WITH BONE BIOPSY WITH MISONIX DEBRIDER;  Surgeon: Evelina Bucy, DPM;  Location: [place];  Service: Podiatry;  Laterality: Left;  . IRRIGATION AND DEBRIDEMENT FOOT Left 12/06/2018   Procedure: Irrigation And Debridement Foot;  Surgeon: Edrick Kins, DPM;  Location: [place];  Service: Podiatry;  Laterality: Left;  . LEG AMPUTATION BELOW KNEE Left   . LUMBAR WOUND DEBRIDEMENT N/A 10/01/2015   Procedure: LUMBAR WOUND DEBRIDEMENT;  Surgeon: Ashok Pall, MD;  Location: [place] [facility];  Service: Neurosurgery;  Laterality: N/A;  LUMBAR WOUND DEBRIDEMENT  . NASAL SINUS SURGERY    . SAVORY DILATION N/A 01/14/2015   Procedure: SAVORY DILATION;  Surgeon: Manya Silvas, MD;  Location:  [facility] ENDOSCOPY;  Service: Endoscopy;  Laterality: N/A;  . TONSILLECTOMY    . WISDOM TOOTH EXTRACTION      There were no vitals filed for this visit.  Subjective Assessment - 10/13/19 1456    Subjective  She is wearing prosthesis from arising for 3 hrs 3x/day.  The prosthesis helps with going to bathroom a lot.    Pertinent History  L TTA, DM, CVA, sleep apnea, PNA, neuropathy bil. 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  No/denies                       [facility] Adult PT Treatment/Exercise - 10/13/19 1500      Transfers   Transfers  Sit to Stand;Stand to [employer] Transfers    Sit to Stand  5: Supervision;With upper extremity assist;With armrests;From chair/3-in-1   to locked rollator walker   Sit to Stand Details  Visual cues for safe use of DME/AE;Verbal cues for technique;Verbal cues for safe use of DME/AE    Stand to Sit  5: Supervision;With upper extremity assist;With armrests;To chair/3-in-1   from locked rollator walker   Stand to Sit Details (indicate cue type and reason)  Visual cues for safe use of DME/AE;Verbal cues for sequencing;Verbal cues for technique;Verbal cues for safe use of DME/AE    Stand Pivot Transfers  5: Supervision   turning 180* sit/stand to locked rollator walker   Stand Pivot Transfer Details (indicate cue type and reason)  demo & verbal cues on technique with rollator walker    Transfer via Lift Equipment  --      Ambulation/Gait   Ambulation/Gait  Yes    Ambulation/Gait Assistance  4: Min guard;5: Supervision    Ambulation/Gait Assistance Details  visual & verbal cues on technique & safety with rollator walker.  Benefits to rollator for transporting items around house like  plate of food/drink and resting point for longer walks.      Ambulation Distance (Feet)  75 Feet   75' X 1 and 50' X 2   Assistive device  Rollator;Prosthesis    Ambulation Surface  Level;Indoor    Ramp  4: Min assist   rollator walker & TTA prosthesis   Ramp Details (indicate cue type and reason)  PT demo & verbal prior and tactile / verbal cues on technique with TTA prosthesis and rollator walker.      Curb  4: Min assist   min guard TTA prosthesis & rollator walker  Curb Details (indicate cue type and reason)  PT demo & verbal prior and tactile / verbal cues on technique with TTA prosthesis and rollator walker.        Therapeutic Activites    Therapeutic Activities  --    Other Therapeutic Activities  --      Prosthetics   Prosthetic Care Comments   Use of antiperspirant to decrease sweating. Using warm wet clothe with doffing to decease risk of scratching with itching.     Demo & verbal cues on positioning in sitting / not letting prosthesis dangle in sitting.  For toileting at night she can donne prosthesis without socks but slow careful as socket cane rotate. Donning liner in sidelying or supine may help with timing for need to urinate.      Current prosthetic wear tolerance (days/week)   daily    Current prosthetic wear tolerance (#hours/day)   3hrs 3x/day PT recommended 4hrs 3x/day with 1-2 hr break to enable skin to dry.      Edema  pitting    Residual limb condition   No open areas,  frail looking skin, pale color, colder temperature, no hair growth (shaving PT advised against) clinderical shape    Education Provided  Skin check;Residual limb care;Correct ply sock adjustment;Other (comment)   see prosthetic care comments   Person(s) Educated  Patient    Education Method  Explanation;Demonstration;Verbal cues    Education Method  Verbalized understanding;Verbal cues required;Needs further instruction    Donning Prosthesis  Supervision    Doffing Prosthesis  Supervision                PT Short Term Goals - 09/22/19 1341      PT SHORT TERM GOAL #1   Title  Patient demonstrates proper donning & verbalizes proper cleaning of prosthesis. (All STGs Target Date: 10/23/2019)    Time  1    Period  Months    Status  New    Target Date  10/23/19      PT SHORT TERM GOAL #2   Title  Patient tolerates prosthesis wear >10 hrs total/ day without skin or limb pain issues.    Time  1    Period  Months    Status  New    Target Date  10/23/19      PT SHORT TERM GOAL #3   Title  Patient standing balance static with eyes open 20 seconds within 3 attempts and reaches 7" anteriorly with RW support with supervision.    Time  1    Period  Months    Status  New    Target Date  10/23/19      PT SHORT TERM GOAL #4   Title  Patient ambulates 100' with RW & prosthesis with supervision.    Time  1    Period  Months    Status  New    Target Date  10/23/19      PT SHORT TERM GOAL #5   Title  patient negotiates ramps & curbs with rolling walker & prosthesis with supervision.    Time  1    Period  Months    Status  New    Target Date  10/23/19        PT Long Term Goals - 09/22/19 1335      PT LONG TERM GOAL #1   Title  Patient verbalizes & demonstrates proper prosthetic care to enable safe utilization of prosthesis.  (All LTGs Target  Date 12/19/2019)    Time  3    Period  Months    Status  New    Target Date  12/19/19      PT LONG TERM GOAL #2   Title  Patient tolerates prosthesis wear & right ankle orthosis wear >90% of awake hours to enable function throughout her day.    Time  3    Period  Months    Status  New    Target Date  12/19/19      PT LONG TERM GOAL #3   Title  Standing balance: static without UE support eyes open for 30 seconds and dynamic with UE support reaching 10", pick up object from floor, managing clothes & static eyes closed 2 minutes safely modified independent.    Time  3    Period  Months    Status  New    Target Date  12/19/19       PT LONG TERM GOAL #4   Title  Patient ambulates 300' including grass with cane or less and prosthesis modified independent for community access.    Time  3    Period  Months    Status  New    Target Date  12/19/19      PT LONG TERM GOAL #5   Title  Patient negotiates ramps & curbs with cane or less & prosthesis modified independent for community access.    Time  3    Period  Months    Status  New    Target Date  12/19/19            Plan - 10/13/19 1455    Clinical Impression Statement  PT instructed patient in use of rollator walker including safety. Benefits of rollator walker vs RW is that rollator would enable carrying household items from one point to another, a resting point for longer walks outside and typically easier on gravel like her driveway.  PT alson instructed in increasing activity tolerance with short, medium & long walks and she seems to understand.    Personal Factors and Comorbidities  Comorbidity 3+;Fitness;Time since onset of injury/illness/exacerbation    Comorbidities  L TTA, DM, CVA, sleep apnea, PNA, neuropathy bil. feet and hand, HTN, s/p cervical fusion, s/p back surgery, IBS, asthma, Covid in Jan 2021    Examination-Activity Limitations  Hygiene/Grooming;Locomotion Level;Reach Overhead;Stairs;Stand;Transfers    Examination-Participation Restrictions  Recruitment consultant  Evolving/Moderate complexity    Rehab Potential  Good    PT Frequency  2x / week    PT Duration  12 weeks    PT Treatment/Interventions  ADLs/Self Care Home Management;DME Instruction;Gait training;Stair training;Functional mobility training;Therapeutic activities;Therapeutic exercise;Balance training;Neuromuscular re-education;Patient/family education;Orthotic Fit/Training;Prosthetic Training;Vestibular    PT Next Visit Plan  standing balance activities, continue to work of gait with rollator walker & RW including ramps & curbs    PT  Home Exercise Plan  at sink for mini squats with UE support, weight shifts with UE support, and balance feet apart no Ue support all with chair behind her    Consulted and Agree with Plan of Care  Patient       Patient will benefit from skilled therapeutic intervention in order to improve the following deficits and impairments:  Abnormal gait, Decreased activity tolerance, Decreased balance, Decreased endurance, Decreased knowledge of use of DME, Decreased mobility, Decreased range of motion, Decreased safety awareness, Decreased strength, Increased edema, Impaired flexibility, Impaired sensation, Postural dysfunction, Prosthetic Dependency,  Obesity, Pain  Visit Diagnosis: Unsteadiness on feet  Abnormal posture  Other abnormalities of gait and mobility  Weakness generalized  Stiffness of left knee, not elsewhere classified  History of fall     Problem List Patient Active Problem List   Diagnosis Date Noted  . History of below knee amputation, left ([place]) 07/30/2019  . Gangrene of left foot ([place]) 05/16/2019  . Subacute osteomyelitis of left foot ([place])   . Charcot's joint of foot, left   . Post-operative state   . PAD (peripheral artery disease) ([place]) 12/05/2018  . Osteomyelitis of ankle or foot, acute, left ([place]) 12/05/2018  . MRSA bacteremia 12/05/2018  . Sepsis without acute organ dysfunction ([place])   . Charcot's joint of foot due to diabetes ([place]) 09/18/2018  . BMI 36.0-36.9,adult   . Morbid obesity ([place]) 11/19/2017  . B12 deficiency 11/01/2017  . Diabetic foot ulcer ([place]) 12/14/2016  . Diabetic polyneuropathy associated with type 2 diabetes mellitus ([place]) 12/13/2015  . Lumbar stenosis with neurogenic claudication 09/02/2015  . Chronic constipation 07/13/2015  . OSA (obstructive sleep apnea) 06/25/2015  . 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/23/2015  . Hypertriglyceridemia 04/23/2015  . Asthma, mild intermittent 02/01/2015  . Type 2 diabetes mellitus with  diabetic nephropathy ([place]) 12/31/2014  . Gastroesophageal reflux disease with esophagitis 12/31/2014  . Dysphagia 12/31/2014  . Bilateral carpal tunnel syndrome 12/01/2014  . Diverticulosis of colon (without mention of hemorrhage) 11/30/2012  . Essential hypertension, benign 11/28/2012    Jamey Reas PT, DPT 10/13/2019, 11:36 PM  [facility] Physical Therapy [address] [place], [place], [address] Phone: [phone]   Fax:  [phone]  Name: Olivia Romero MRN: [id number] Date of Birth: 10-16-1955

## 2019-10-13 NOTE — Patient Instructions (Signed)
[email or web address]   Single shoe in search bar   Increasing your activity level is important. Short distances which is walking from one room to another. Work to increase frequency back to prior level. Medium distances are entering & exiting your home or community with limited distances. Start with 4 medium walks which is one outing to one location and increase number of tolerated amounts. Long distance is your highest tolerance for you. Walk until you feel you must rest. Back or leg pain or general fatigue are indicators to maximum tolerance. Monitor by distance or time. Try to walk your BEST distance 1-2 times per day. You should see this increase over time.

## 2019-10-14 NOTE — Telephone Encounter (Signed)
Left detailed voicemail [employer] does not accept her insurance

## 2019-10-14 NOTE — Telephone Encounter (Signed)
Tamera called requesting a call back from Delsa Grana, please advise

## 2019-10-15 NOTE — Telephone Encounter (Signed)
1. Pt following up on incontinent supplies.  Pt states she gave the person that took her message, 3 different places   [facility] [place]  But states she was advised her dr needs to send the order for the supplies. (Per note, [employer] will not accept insurance).  2.  Pt needs an order for a Rolator, with a seat. [employer] advised her pcp needs to call them and give them the order.  [employer] can order from their end.

## 2019-10-16 ENCOUNTER — Other Ambulatory Visit: Payer: Self-pay

## 2019-10-16 ENCOUNTER — Ambulatory Visit (INDEPENDENT_AMBULATORY_CARE_PROVIDER_SITE_OTHER): Payer: Medicare Other | Admitting: Physical Therapy

## 2019-10-16 DIAGNOSIS — Z9181 History of falling: Secondary | ICD-10-CM

## 2019-10-16 DIAGNOSIS — R2681 Unsteadiness on feet: Secondary | ICD-10-CM | POA: Diagnosis not present

## 2019-10-16 DIAGNOSIS — R2689 Other abnormalities of gait and mobility: Secondary | ICD-10-CM

## 2019-10-16 DIAGNOSIS — R350 Frequency of micturition: Secondary | ICD-10-CM

## 2019-10-16 DIAGNOSIS — R531 Weakness: Secondary | ICD-10-CM

## 2019-10-16 DIAGNOSIS — R32 Unspecified urinary incontinence: Secondary | ICD-10-CM

## 2019-10-16 DIAGNOSIS — R293 Abnormal posture: Secondary | ICD-10-CM

## 2019-10-16 DIAGNOSIS — R8290 Unspecified abnormal findings in urine: Secondary | ICD-10-CM

## 2019-10-16 DIAGNOSIS — M25662 Stiffness of left knee, not elsewhere classified: Secondary | ICD-10-CM

## 2019-10-16 MED ORDER — ASSURANCE FITTED BRIEF LARGE MISC: 5 refills | Status: AC

## 2019-10-16 NOTE — Telephone Encounter (Signed)
Called [facility] and ins will not cover incontience supplies there either, pt notified

## 2019-10-16 NOTE — Therapy (Signed)
[facility] Physical Therapy [address] [place], [place], [address] Phone: [phone]   Fax:  [phone]  Physical Therapy Treatment  Patient Details  Name: Olivia Romero MRN: [id number] Date of Birth: 10/30/1955 Referring Provider (PT): Meridee Score, MD   Encounter Date: 10/16/2019  PT End of Session - 10/16/19 1447    Visit Number  6    Number of Visits  25    Date for PT Re-Evaluation  12/21/19    Authorization Type  [employer] Medicare, $30 co-pay, no visit limit    PT Start Time  1147    PT Stop Time  1232    PT Time Calculation (min)  45 min    Equipment Utilized During Treatment  Gait belt    Activity Tolerance  Patient tolerated treatment well    Behavior During Therapy  [facility] for tasks assessed/performed       Past Medical History:  Diagnosis Date  . Anemia   . Arthritis   . Asthma   . Colon polyps    adenomatous  . Diabetes mellitus without complication ([place])   . Diabetic infection of left foot ([place]) 12/2018  . Diverticulosis of colon   . Esophagitis   . GERD (gastroesophageal reflux disease)   . Hemorrhoid    internal  . Hyperlipemia   . Hypertension   . IBS (irritable bowel syndrome)    no current prob - diet controlled  . Myalgia due to statin 11/19/2017  . Neuropathy   . Neuropathy of both feet   . Neuropathy of hand   . Pneumonia    x 4  . PONV (postoperative nausea and vomiting)    on some surgeries but not all procedures  . Skin ulcer of right ankle, limited to breakdown of skin ([place]) 12/31/2016   resolved per patient 05/14/19  . Sleep apnea    has had in the past lost 50 pounds and do longer uses cpap  . Statin intolerance 04/23/2015  . Stroke [facility]) 2009   mini stroke per patient  . Stroke-like episode 2009   TIA - mini stroke per patient  . 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 Uncontrolled type 2 diabetes mellitus with hyperglycemia ([place]) 11/01/2017    Past Surgical History:   Procedure Laterality Date  . ABDOMINAL HYSTERECTOMY    . AMPUTATION Left 12/06/2018   Procedure: PARTIAL AMPUTATION LEFT FOOT;  Surgeon: Edrick Kins, DPM;  Location: [place];  Service: Podiatry;  Laterality: Left;  . AMPUTATION Left 05/16/2019   Procedure: LEFT BELOW KNEE AMPUTATION;  Surgeon: Newt Minion, MD;  Location: [place];  Service: Orthopedics;  Laterality: Left;  . BACK SURGERY  09/02/2015  . BONE BIOPSY Left 12/06/2018   Procedure: Bone Biopsy;  Surgeon: Edrick Kins, DPM;  Location: [place];  Service: Podiatry;  Laterality: Left;  . CERVICAL FUSION    . CESAREAN SECTION  1986  . COLONOSCOPY N/A 11/30/2012   Procedure: COLONOSCOPY;  Surgeon: Irene Shipper, MD;  Location: [facility] ENDOSCOPY;  Service: Endoscopy;  Laterality: N/A;  . COLONOSCOPY WITH PROPOFOL N/A 01/03/2016   Procedure: COLONOSCOPY WITH PROPOFOL;  Surgeon: Manya Silvas, MD;  Location: [facility] ENDOSCOPY;  Service: Endoscopy;  Laterality: N/A;  . ESOPHAGOGASTRODUODENOSCOPY (EGD) WITH PROPOFOL N/A 01/14/2015   Procedure: ESOPHAGOGASTRODUODENOSCOPY (EGD) WITH PROPOFOL;  Surgeon: Manya Silvas, MD;  Location: [facility] ENDOSCOPY;  Service: Endoscopy;  Laterality: N/A;  . IRRIGATION AND DEBRIDEMENT FOOT Left 03/13/2018   Procedure: IRRIGATION  AND DEBRIDEMENT FOOT WITH BONE BIOPSY WITH MISONIX DEBRIDER;  Surgeon: Evelina Bucy, DPM;  Location: [place];  Service: Podiatry;  Laterality: Left;  . IRRIGATION AND DEBRIDEMENT FOOT Left 12/06/2018   Procedure: Irrigation And Debridement Foot;  Surgeon: Edrick Kins, DPM;  Location: [place];  Service: Podiatry;  Laterality: Left;  . LEG AMPUTATION BELOW KNEE Left   . LUMBAR WOUND DEBRIDEMENT N/A 10/01/2015   Procedure: LUMBAR WOUND DEBRIDEMENT;  Surgeon: Ashok Pall, MD;  Location: [place] NEURO ORS;  Service: Neurosurgery;  Laterality: N/A;  LUMBAR WOUND DEBRIDEMENT  . NASAL SINUS SURGERY    . SAVORY DILATION N/A 01/14/2015   Procedure: SAVORY DILATION;  Surgeon: Manya Silvas, MD;  Location:  [facility] ENDOSCOPY;  Service: Endoscopy;  Laterality: N/A;  . TONSILLECTOMY    . WISDOM TOOTH EXTRACTION      There were no vitals filed for this visit.  Subjective Assessment - 10/16/19 1444    Subjective  relays no skin breakdown or issues with prosthesis. Says she wants to visit her friend but they have 3 steps to enter with one Handrail, she thinks it is on the left side going up.    Pertinent History  L TTA, DM, CVA, sleep apnea, PNA, neuropathy bil. 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, in community         [facility] PT Assessment - 10/16/19 0001      Assessment   Medical Diagnosis  Left Transtibial Amputation      AROM   Overall AROM Comments  lumbar ROM WNL, more painful with flexion, sidebending Rt, and rotation, feels better with extension    AROM Assessment Site  Lumbar      Special Tests   Other special tests  + SLR test on Rt indicative of disc pathology, negative on Lt          [facility] Adult PT Treatment/Exercise - 10/16/19 0001      Transfers   Transfers  Sit to Stand;Stand to Sit;Stand Pivot Transfers    Sit to Stand  6: Modified independent (Device/Increase time)    Stand to Sit  6: Modified independent (Device/Increase time)    Stand Pivot Transfers  5: Supervision      Therapeutic Activites    Other Therapeutic Activities  Stair training 15 steps up/down total broken down 3 steps at a time trying first going up sideways using Lt handrail only, then peformed going up sideways using Lt handrail only, more diffculty with this, then performed going up straight and down straight using Lt handrail only and she felt more comfortable with this, all performed with CGA      Exercises   Other Exercises   Lumbar exercises: seated lumbar extension X  10 reps, supine pelvic rocks X 10 reps, supine piriformis stretch on Rt side 3X30 sec               PT Short Term Goals - 09/22/19 1341      PT SHORT TERM GOAL #1   Title  Patient demonstrates proper donning & verbalizes proper cleaning of prosthesis. (All STGs Target Date: 10/23/2019)    Time  1    Period  Months    Status  New    Target Date  10/23/19  PT SHORT TERM GOAL #2   Title  Patient tolerates prosthesis wear >10 hrs total/ day without skin or limb pain issues.    Time  1    Period  Months    Status  New    Target Date  10/23/19      PT SHORT TERM GOAL #3   Title  Patient standing balance static with eyes open 20 seconds within 3 attempts and reaches 7" anteriorly with RW support with supervision.    Time  1    Period  Months    Status  New    Target Date  10/23/19      PT SHORT TERM GOAL #4   Title  Patient ambulates 100' with RW & prosthesis with supervision.    Time  1    Period  Months    Status  New    Target Date  10/23/19      PT SHORT TERM GOAL #5   Title  patient negotiates ramps & curbs with rolling walker & prosthesis with supervision.    Time  1    Period  Months    Status  New    Target Date  10/23/19        PT Long Term Goals - 09/22/19 1335      PT LONG TERM GOAL #1   Title  Patient verbalizes & demonstrates proper prosthetic care to enable safe utilization of prosthesis.  (All LTGs Target Date 12/19/2019)    Time  3    Period  Months    Status  New    Target Date  12/19/19      PT LONG TERM GOAL #2   Title  Patient tolerates prosthesis wear & right ankle orthosis wear >90% of awake hours to enable function throughout her day.    Time  3    Period  Months    Status  New    Target Date  12/19/19      PT LONG TERM GOAL #3   Title  Standing balance: static without UE support eyes open for 30 seconds and dynamic with UE support reaching 10", pick up object from floor, managing clothes & static eyes closed 2 minutes safely  modified independent.    Time  3    Period  Months    Status  New    Target Date  12/19/19      PT LONG TERM GOAL #4   Title  Patient ambulates 300' including grass with cane or less and prosthesis modified independent for community access.    Time  3    Period  Months    Status  New    Target Date  12/19/19      PT LONG TERM GOAL #5   Title  Patient negotiates ramps & curbs with cane or less & prosthesis modified independent for community access.    Time  3    Period  Months    Status  New    Target Date  12/19/19            Plan - 10/16/19 1522    Clinical Impression Statement  Session focused on stair training so she can better access the community and visit with her friend. If handrail is on left she prefers to go up sideways and this was safest for her. If handrail is on left she prefers to go up/down straight on leading up with sound side and leading down with prosthetic side.  Then PT screened her back as she is having more complaints of back pain now that she is more active. She had signs and symptoms of disc pathology L3-L4 and this is also confirmed on MRI. Provided her with HEP to trial for her LBP.    Personal Factors and Comorbidities  Comorbidity 3+;Fitness;Time since onset of injury/illness/exacerbation    Comorbidities  L TTA, DM, CVA, sleep apnea, PNA, neuropathy bil. feet and hand, HTN, s/p cervical fusion, s/p back surgery, IBS, asthma, Covid in Jan 2021    Examination-Activity Limitations  Hygiene/Grooming;Locomotion Level;Reach Overhead;Stairs;Stand;Transfers    Examination-Participation Restrictions  Recruitment consultant  Evolving/Moderate complexity    Rehab Potential  Good    PT Frequency  2x / week    PT Duration  12 weeks    PT Treatment/Interventions  ADLs/Self Care Home Management;DME Instruction;Gait training;Stair training;Functional mobility training;Therapeutic activities;Therapeutic exercise;Balance  training;Neuromuscular re-education;Patient/family education;Orthotic Fit/Training;Prosthetic Training;Vestibular    PT Next Visit Plan  standing balance activities, continue to work of gait with rollator walker & RW including ramps & curbs, stairs. How is LBP    PT Home Exercise Plan  at sink for mini squats with UE support, weight shifts with UE support, and balance feet apart no Ue support all with chair behind her    Consulted and Agree with Plan of Care  Patient       Patient will benefit from skilled therapeutic intervention in order to improve the following deficits and impairments:  Abnormal gait, Decreased activity tolerance, Decreased balance, Decreased endurance, Decreased knowledge of use of DME, Decreased mobility, Decreased range of motion, Decreased safety awareness, Decreased strength, Increased edema, Impaired flexibility, Impaired sensation, Postural dysfunction, Prosthetic Dependency, Obesity, Pain  Visit Diagnosis: Unsteadiness on feet  Abnormal posture  Other abnormalities of gait and mobility  Weakness generalized  Stiffness of left knee, not elsewhere classified  History of fall     Problem List Patient Active Problem List   Diagnosis Date Noted  . History of below knee amputation, left ([place]) 07/30/2019  . Gangrene of left foot ([place]) 05/16/2019  . Subacute osteomyelitis of left foot ([place])   . Charcot's joint of foot, left   . Post-operative state   . PAD (peripheral artery disease) ([place]) 12/05/2018  . Osteomyelitis of ankle or foot, acute, left ([place]) 12/05/2018  . MRSA bacteremia 12/05/2018  . Sepsis without acute organ dysfunction ([place])   . Charcot's joint of foot due to diabetes ([place]) 09/18/2018  . BMI 36.0-36.9,adult   . Morbid obesity ([place]) 11/19/2017  . B12 deficiency 11/01/2017  . Diabetic foot ulcer ([place]) 12/14/2016  . Diabetic polyneuropathy associated with type 2 diabetes mellitus ([place]) 12/13/2015  . Lumbar stenosis with neurogenic  claudication 09/02/2015  . Chronic constipation 07/13/2015  . OSA (obstructive sleep apnea) 06/25/2015  . 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/23/2015  . Hypertriglyceridemia 04/23/2015  . Asthma, mild intermittent 02/01/2015  . Type 2 diabetes mellitus with diabetic nephropathy ([place]) 12/31/2014  . Gastroesophageal reflux disease with esophagitis 12/31/2014  . Dysphagia 12/31/2014  . Bilateral carpal tunnel syndrome 12/01/2014  . Diverticulosis of colon (without mention of hemorrhage) 11/30/2012  . Essential hypertension, benign 11/28/2012    Silvestre Mesi 10/16/2019, 3:35 PM  [facility] Physical Therapy [address] [place], [place], [address] Phone: [phone]   Fax:  [phone]  Name: JASPREET FUNT MRN: [id number] Date of Birth: [birth year]

## 2019-10-20 ENCOUNTER — Ambulatory Visit (INDEPENDENT_AMBULATORY_CARE_PROVIDER_SITE_OTHER): Payer: Medicare Other | Admitting: Urology

## 2019-10-20 ENCOUNTER — Encounter: Payer: Self-pay | Admitting: Urology

## 2019-10-20 ENCOUNTER — Other Ambulatory Visit: Payer: Self-pay

## 2019-10-20 VITALS — BP 122/69 | HR 81

## 2019-10-20 DIAGNOSIS — N3946 Mixed incontinence: Secondary | ICD-10-CM | POA: Diagnosis not present

## 2019-10-20 DIAGNOSIS — R32 Unspecified urinary incontinence: Secondary | ICD-10-CM

## 2019-10-20 LAB — BLADDER SCAN AMB NON-IMAGING: Scan Result: 20

## 2019-10-20 LAB — URINALYSIS, COMPLETE
Bilirubin, UA: NEGATIVE
Glucose, UA: NEGATIVE
Ketones, UA: NEGATIVE
Leukocytes,UA: NEGATIVE
Nitrite, UA: NEGATIVE
Protein,UA: NEGATIVE
RBC, UA: NEGATIVE
Specific Gravity, UA: 1.02 (ref 1.005–1.030)
Urobilinogen, Ur: 0.2 mg/dL (ref 0.2–1.0)
pH, UA: 5 (ref 5.0–7.5)

## 2019-10-20 LAB — MICROSCOPIC EXAMINATION: Bacteria, UA: NONE SEEN

## 2019-10-20 IMAGING — MR MR ANKLE*L* W/O CM
5 series · 36 of 40 positions shown · non-contrast
Comparison: None.

CLINICAL DATA: Lateral pain and weakness.

EXAM:
MRI OF THE LEFT ANKLE WITHOUT CONTRAST
TECHNIQUE: Multiplanar, multisequence MR imaging of the ankle was performed. No
intravenous contrast was administered.

[Series 4: T2 fat-sat · axial · 3.0mm · 0.50mm/px · z∈[-82,+45]mm · 9 of 34 slices shown (1 of 2)]
[im 1/34]
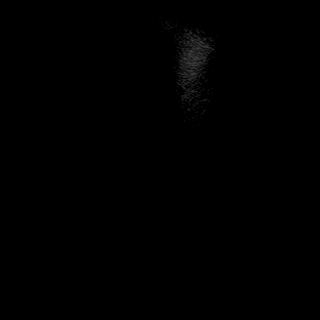
[im 5/34]
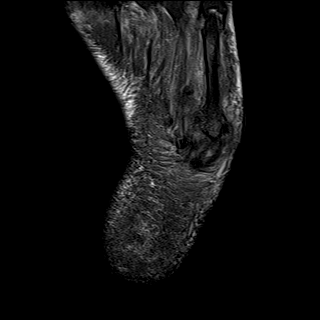
[im 9/34]
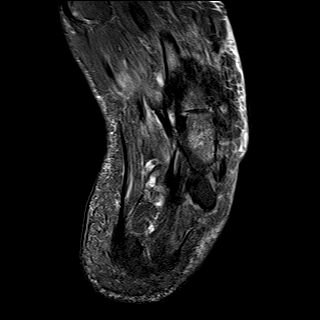
[im 13/34]
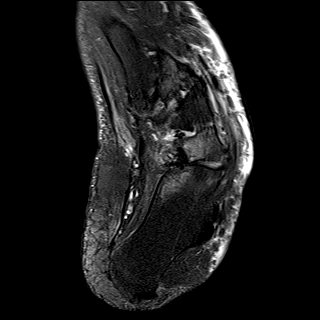
[im 17/34]
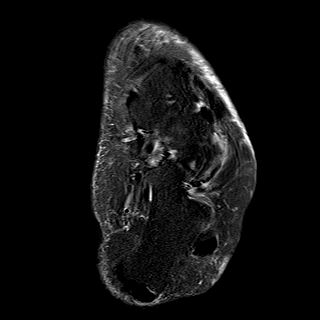
[im 21/34]
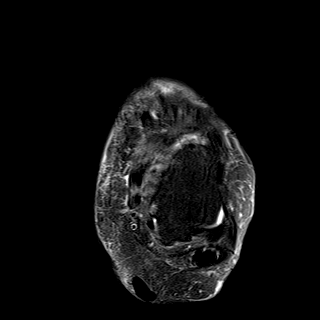
[im 25/34]
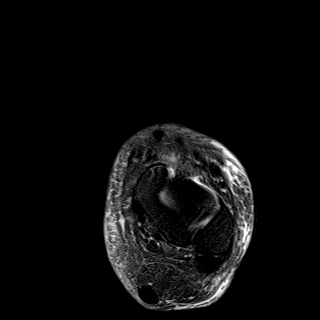
[im 29/34]
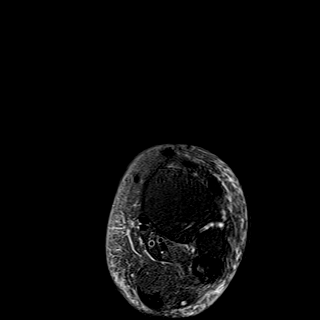
[im 34/34]
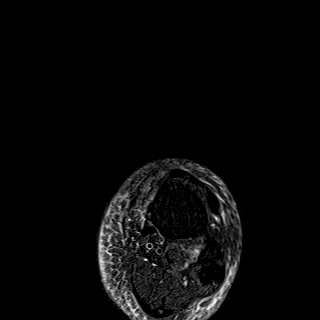

[Series 6: T1 · sagittal · 4.0mm · 0.56mm/px · 5 of 20 slices shown]
[im 1/20]
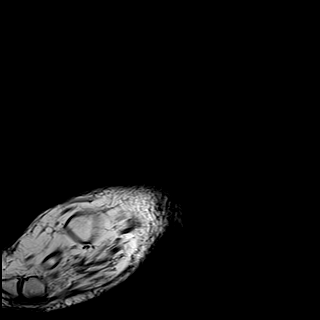
[im 5/20]
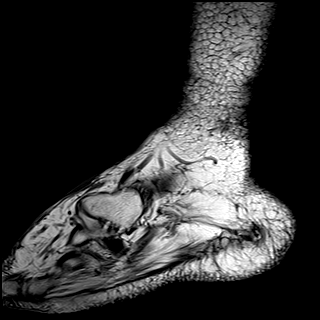
[im 10/20]
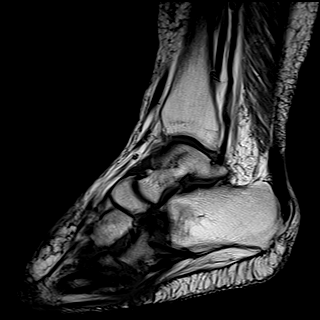
[im 15/20]
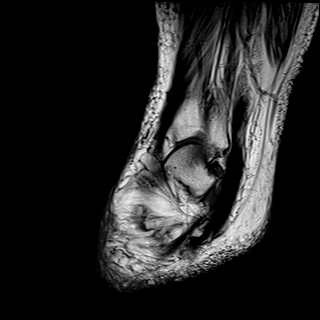
[im 20/20]
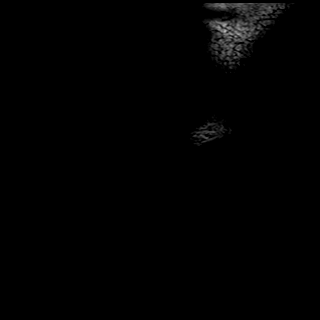

[Series 7: PD fat-sat · axial · 3.0mm · 0.42mm/px · z∈[-82,+45]mm · 9 of 34 slices shown]
[im 1/34]
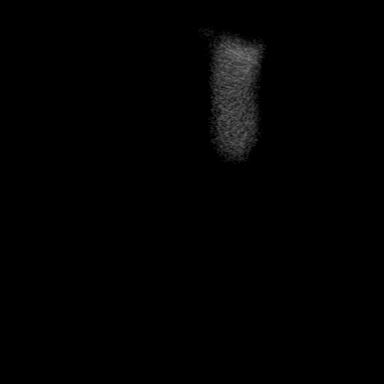
[im 5/34]
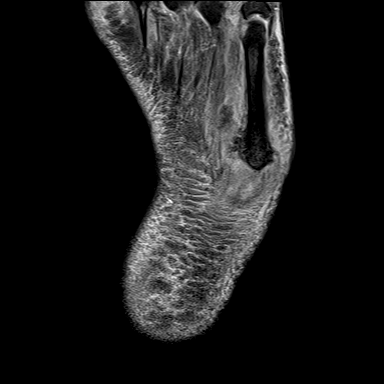
[im 9/34]
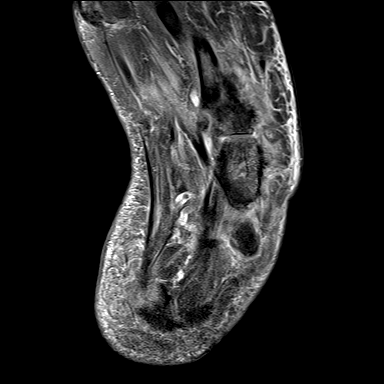
[im 13/34]
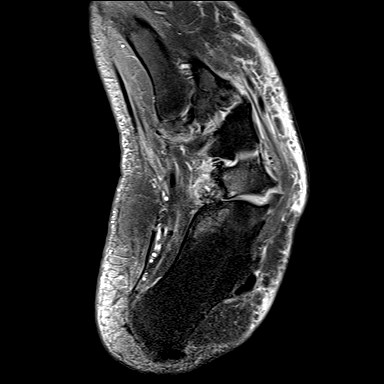
[im 17/34]
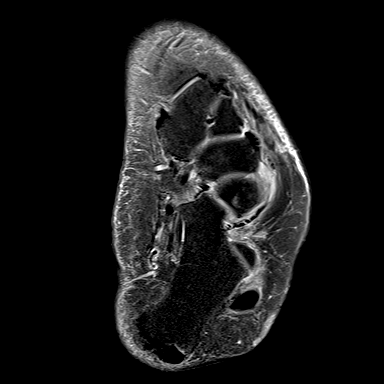
[im 21/34]
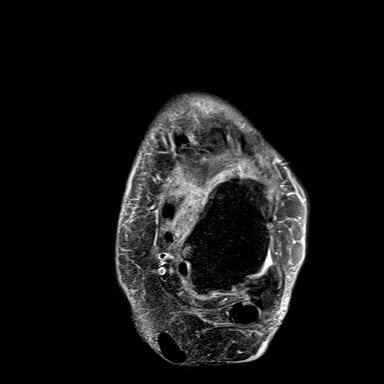
[im 25/34]
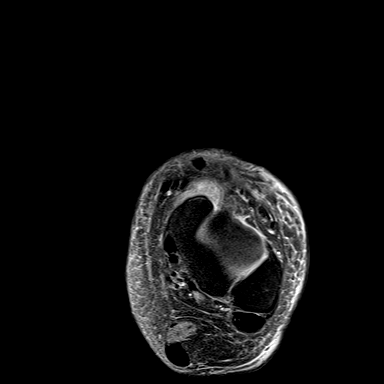
[im 29/34]
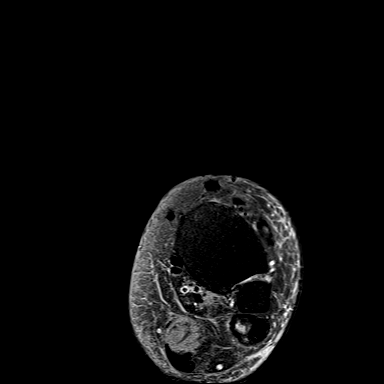
[im 34/34]
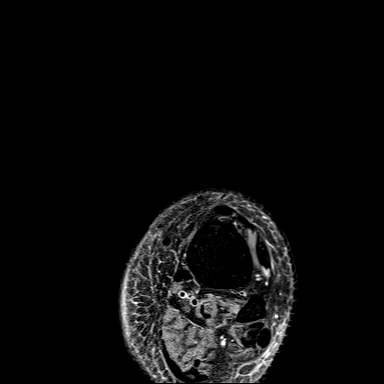

[Series 8: STIR · sagittal · 4.0mm · 0.35mm/px · 5 of 25 slices shown]
[im 1/25]
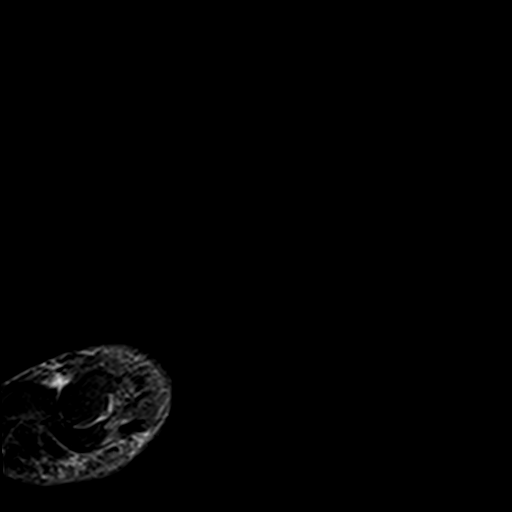
[im 5/25]
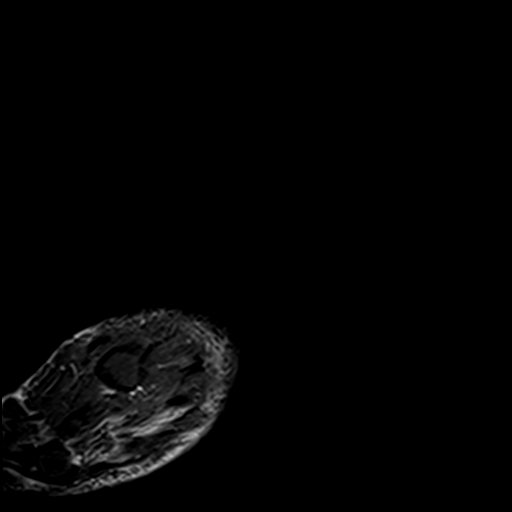
[im 9/25]
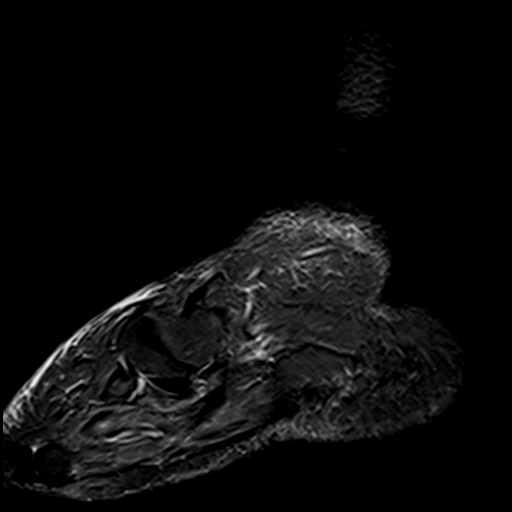
[im 13/25]
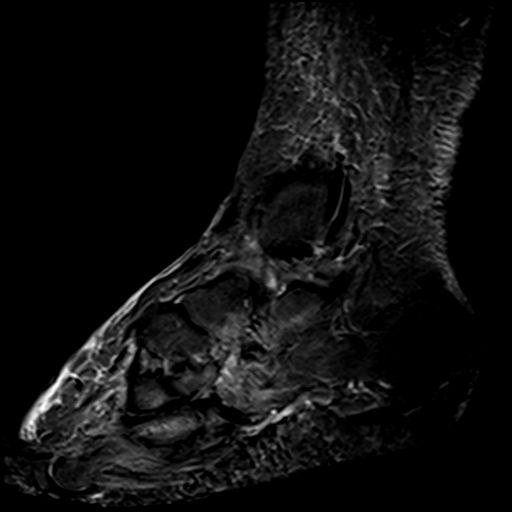
[im 17/25]
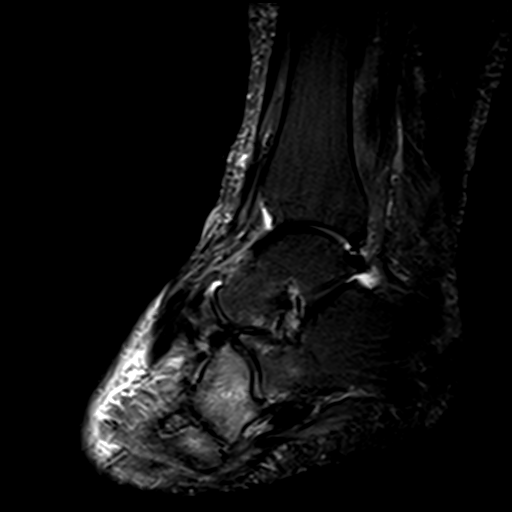

[Series 9: T2 fat-sat · coronal · 3.0mm · 0.53mm/px · 8 of 38 slices shown (2 of 2)]
[im 1/38]
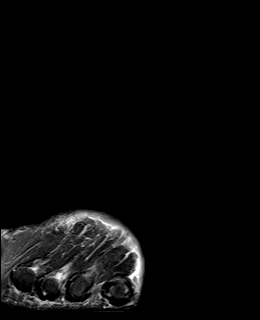
[im 5/38]
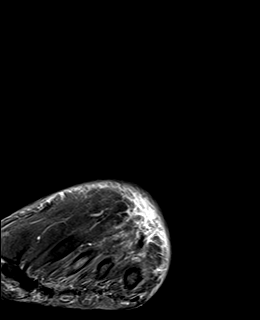
[im 13/38]
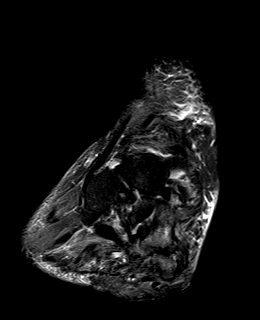
[im 17/38]
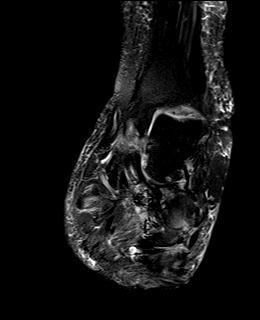
[im 21/38]
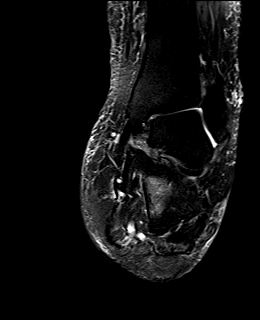
[im 25/38]
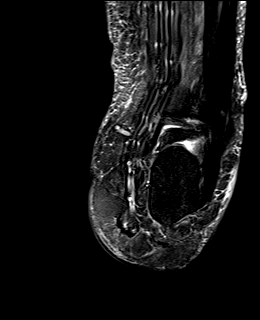
[im 33/38]
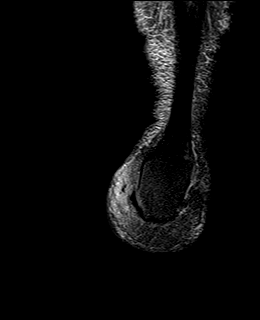
[im 38/38]
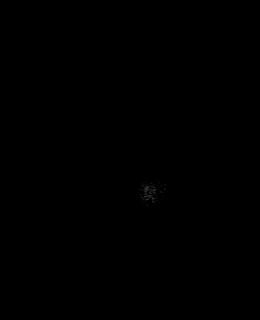

[36 of 40 positions shown; findings below may reference images not displayed]

FINDINGS: TENDONS

Peroneal: Severe tendinosis of the peroneus brevis with a high-grade
partial-thickness tear at the level of the distal fibula. Moderate
tendinosis of peroneus longus.

Posteromedial: Posterior tibial tendon intact. Flexor hallucis
longus tendon intact. Flexor digitorum longus tendon intact.

Anterior: Tibialis anterior tendon intact. Extensor hallucis longus
tendon intact Extensor digitorum longus tendon intact.

Achilles:  Intact.

Plantar Fascia: Intact.

LIGAMENTS

Lateral: Anterior talofibular ligament intact. Calcaneofibular
ligament intact. Posterior talofibular ligament intact. Anterior and
posterior tibiofibular ligaments intact.

Medial: Deltoid ligament intact. Spring ligament intact.

CARTILAGE

Ankle Joint: No joint effusion. Normal ankle mortise. No chondral
defect.

Subtalar Joints/Sinus Tarsi: Normal subtalar joints. No subtalar
joint effusion. Normal sinus tarsi.

Bones: Severe osteoarthritis of the calcaneocuboid joint with a
moderate joint effusion and subchondral marrow edema. Mild-moderate
osteoarthritis of the fourth and fifth tarsometatarsal joints.
Marrow edema in the proximal shaft of the third, fourth and fifth
metatarsals concerning for stress reaction.

Soft Tissue: No fluid collection or hematoma.
IMPRESSION: 1. Severe tendinosis of the peroneus brevis with a high-grade
partial-thickness tear at the level of the distal fibula.
2. Moderate tendinosis of peroneus longus.
3. Severe osteoarthritis of the calcaneocuboid joint with a moderate
joint effusion and subchondral marrow edema. Mild-moderate
osteoarthritis of the fourth and fifth tarsometatarsal joints.
Marrow edema in the proximal shaft of the third, fourth and fifth
metatarsals concerning for stress reaction.

## 2019-10-20 NOTE — Progress Notes (Signed)
10/20/2019 11:21 AM   [name] May 14, 1956 [id number]  Referring provider: Delsa Grana, PA-C [address] [place],  [place] [address]  Chief Complaint  Patient presents with  . Urinary Incontinence    HPI: I was consulted to assess the patient's urinary incontinence.  I believe her primary symptom is urge incontinence and she can soak 3 or 4 pads a day.  She sometimes leaks with coughing sneezing but not bending lifting.  No bedwetting.  She time voids every 60 to 90 minutes and gets up twice at night.  She has had a TIA.  She has had neck and lower back surgery.  She is an insulin-dependent diabetic.  She has had a hysterectomy  No history of bladder infections previous bladder surgery or kidney stones.  Normal bowel movements and no previous treatment   PMH: Past Medical History:  Diagnosis Date  . Anemia   . Arthritis   . Asthma   . Colon polyps    adenomatous  . Diabetes mellitus without complication ([place])   . Diabetic infection of left foot ([place]) 12/2018  . Diverticulosis of colon   . Esophagitis   . GERD (gastroesophageal reflux disease)   . Hemorrhoid    internal  . Hyperlipemia   . Hypertension   . IBS (irritable bowel syndrome)    no current prob - diet controlled  . Myalgia due to statin 11/19/2017  . Neuropathy   . Neuropathy of both feet   . Neuropathy of hand   . Pneumonia    x 4  . PONV (postoperative nausea and vomiting)    on some surgeries but not all procedures  . Skin ulcer of right ankle, limited to breakdown of skin ([place]) 12/31/2016   resolved per patient 05/14/19  . Sleep apnea    has had in the past lost 50 pounds and do longer uses cpap  . Statin intolerance 04/23/2015  . Stroke [facility]) 2009   mini stroke per patient  . Stroke-like episode 2009   TIA - mini stroke per patient  . 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 Uncontrolled type 2 diabetes mellitus with hyperglycemia ([place])  11/01/2017    Surgical History: Past Surgical History:  Procedure Laterality Date  . ABDOMINAL HYSTERECTOMY    . AMPUTATION Left 12/06/2018   Procedure: PARTIAL AMPUTATION LEFT FOOT;  Surgeon: Edrick Kins, DPM;  Location: [facility];  Service: Podiatry;  Laterality: Left;  . AMPUTATION Left 05/16/2019   Procedure: LEFT BELOW KNEE AMPUTATION;  Surgeon: Newt Minion, MD;  Location: [place];  Service: Orthopedics;  Laterality: Left;  . BACK SURGERY  09/02/2015  . BONE BIOPSY Left 12/06/2018   Procedure: Bone Biopsy;  Surgeon: Edrick Kins, DPM;  Location: [place];  Service: Podiatry;  Laterality: Left;  . CERVICAL FUSION    . CESAREAN SECTION  1986  . COLONOSCOPY N/A 11/30/2012   Procedure: COLONOSCOPY;  Surgeon: Irene Shipper, MD;  Location: [facility] ENDOSCOPY;  Service: Endoscopy;  Laterality: N/A;  . COLONOSCOPY WITH PROPOFOL N/A 01/03/2016   Procedure: COLONOSCOPY WITH PROPOFOL;  Surgeon: Manya Silvas, MD;  Location[facility] ENDOSCOPY;  Service: Endoscopy;  Laterality: N/A;  . ESOPHAGOGASTRODUODENOSCOPY (EGD) WITH PROPOFOL N/A 01/14/2015   Procedure: ESOPHAGOGASTRODUODENOSCOPY (EGD) WITH PROPOFOL;  Surgeon: Manya Silvas, MD;  Location: [facility] ENDOSCOPY;  Service: Endoscopy;  Laterality: N/A;  . IRRIGATION AND DEBRIDEMENT FOOT Left 03/13/2018   Procedure: IRRIGATION AND DEBRIDEMENT FOOT WITH BONE  BIOPSY WITH MISONIX DEBRIDER;  Surgeon: Evelina Bucy, DPM;  Location: [facility];  Service: Podiatry;  Laterality: Left;  . IRRIGATION AND DEBRIDEMENT FOOT Left 12/06/2018   Procedure: Irrigation And Debridement Foot;  Surgeon: Edrick Kins, DPM;  Location: [place];  Service: Podiatry;  Laterality: Left;  . LEG AMPUTATION BELOW KNEE Left   . LUMBAR WOUND DEBRIDEMENT N/A 10/01/2015   Procedure: LUMBAR WOUND DEBRIDEMENT;  Surgeon: Ashok Pall, MD;  Location: [place] NEURO ORS;  Service: Neurosurgery;  Laterality: N/A;  LUMBAR WOUND DEBRIDEMENT  . NASAL SINUS SURGERY    . SAVORY DILATION N/A 01/14/2015   Procedure:  SAVORY DILATION;  Surgeon: Manya Silvas, MD;  Location: [facility] ENDOSCOPY;  Service: Endoscopy;  Laterality: N/A;  . TONSILLECTOMY    . WISDOM TOOTH EXTRACTION      Home Medications:  Allergies as of 10/20/2019      Reactions   Eggs Or Egg-derived Products Swelling, Other (See Comments)   Angioedema   Atorvastatin Other (See Comments)   Liver toxicity   Flu Virus Vaccine Swelling   Arm swelled (site of injection)   Latex Itching   Pravastatin Itching, Rash   Tape Rash, Other (See Comments)   Adhesive on foot pull off skin.  Ok to use paper tape.      Medication List       Accurate as of Oct 20, 2019 11:21 AM. If you have any questions, ask your nurse or doctor.        acetaminophen 500 MG tablet Commonly known as: TYLENOL Take 1,000 mg by mouth every 6 (six) hours as needed for mild pain. Maximum of 3,000 mg per day   Albuterol Sulfate 108 (90 Base) MCG/ACT Aepb Commonly known as: ProAir RespiClick Inhale 1-2 puffs into the lungs every 6 (six) hours as needed (Shortness of breath or wheezing).   ALKA-SELTZER PLUS COLD PO Take 2 tablets by mouth as needed (allergies). Day and night time   aspirin EC 325 MG tablet Take 1 tablet (325 mg total) by mouth daily.   Assurance Fitted Brief Large Misc Use 1 brief up to 6 times a day as needed for urinary incontinence   Contour Next Monitor w/Device Kit as directed.   DULoxetine 60 MG capsule Commonly known as: CYMBALTA Take 60 mg by mouth at bedtime.   fenofibrate 145 MG tablet Commonly known as: TRICOR Take 1 tablet (145 mg total) by mouth daily.   gabapentin 400 MG capsule Commonly known as: NEURONTIN Take 800 mg by mouth 3 (three) times daily.   glimepiride 2 MG tablet Commonly known as: AMARYL Take 2 mg by mouth 2 (two) times daily.   hydrochlorothiazide 12.5 MG tablet Commonly known as: HYDRODIURIL Take 1 tablet (12.5 mg total) by mouth daily.   metFORMIN 500 MG 24 hr tablet Commonly known as:  GLUCOPHAGE-XR Take 500 mg by mouth 3 (three) times daily.   pantoprazole 40 MG tablet Commonly known as: PROTONIX Take 1 tablet (40 mg total) by mouth daily as needed (Heartburn).   Tyler Aas FlexTouch 200 UNIT/ML FlexTouch Pen Generic drug: insulin degludec Inject 20 Units into the skin at bedtime.   UltiCare Short Pen Needles 31G X 8 MM Misc Generic drug: Insulin Pen Needle       Allergies:  Allergies  Allergen Reactions  . Eggs Or Egg-Derived Products Swelling and Other (See Comments)    Angioedema  . Atorvastatin Other (See Comments)    Liver toxicity  . Flu Virus Vaccine Swelling  Arm swelled (site of injection)  . Latex Itching  . Pravastatin Itching and Rash  . Tape Rash and Other (See Comments)    Adhesive on foot pull off skin.  Ok to use paper tape.    Family History: Family History  Problem Relation Age of Onset  . Lung cancer Father   . Hypertension Father   . Arthritis Father   . Other Mother        hardening of the arteries/renal cell carcenoma  . Hypertension Mother   . Stroke Mother   . Kidney cancer Mother   . Arthritis Brother   . Rheum arthritis Maternal Uncle   . Bladder Cancer Neg Hx     Social History:  reports that she has never smoked. She has never used smokeless tobacco. She reports that she does not drink alcohol or use drugs.  ROS:                                        Physical Exam: BP 122/69   Pulse 81   LMP  (LMP Unknown)   Constitutional:  Alert and oriented, No acute distress. HEENT: [place] AT, moist mucus membranes.  Trachea midline, no masses. Cardiovascular: No clubbing, cyanosis, or edema. Respiratory: Normal respiratory effort, no increased work of breathing. GI: Abdomen is soft, nontender, nondistended, no abdominal masses GU: No CVA tenderness.  Skin: No rashes, bruises or suspicious lesions. Lymph: No cervical or inguinal adenopathy. Neurologic: Grossly intact, no focal deficits, moving all  4 extremities. Psychiatric: Normal mood and affect.  Laboratory Data: Lab Results  Component Value Date   WBC 8.4 05/16/2019   HGB 11.2 (L) 05/16/2019   HCT 36.1 05/16/2019   MCV 83.0 05/16/2019   PLT 310 05/16/2019    Lab Results  Component Value Date   CREATININE 0.84 05/16/2019    No results found for: PSA  No results found for: TESTOSTERONE  Lab Results  Component Value Date   HGBA1C 7.3 (H) 05/16/2019    Urinalysis    Component Value Date/Time   COLORURINE YELLOW 12/04/2018 1707   APPEARANCEUR HAZY (A) 12/04/2018 1707   LABSPEC 1.022 12/04/2018 1707   PHURINE 5.0 12/04/2018 1707   GLUCOSEU >=500 (A) 12/04/2018 1707   HGBUR NEGATIVE 12/04/2018 1707   BILIRUBINUR neg 09/30/2019 1128   [place] 12/04/2018 1707   PROTEINUR Negative 09/30/2019 1128   PROTEINUR NEGATIVE 12/04/2018 1707   UROBILINOGEN 0.2 09/30/2019 1128   NITRITE neg 09/30/2019 1128   NITRITE NEGATIVE 12/04/2018 1707   LEUKOCYTESUR Negative 09/30/2019 1128   LEUKOCYTESUR NEGATIVE 12/04/2018 1707    Pertinent Imaging: Urine reviewed.  Chart reviewed.  Urine sent for culture.   Assessment & Plan: Patient has mixed incontinence but primarily urge incontinence.  I will evaluate the patient in 8 weeks on Gemtesa and Mybetriq samples for for cystoscopy.  She may need urodynamics in the future  1. Urinary incontinence, unspecified type  - Urinalysis, Complete - Bladder Scan (Post Void Residual) in office   No follow-ups on file.  Reece Packer, MD  [facility] [address], [place], [place] [address] [phone]

## 2019-10-20 NOTE — Patient Instructions (Signed)
Try Myrbetriq for one month then start Gemtesa-follow up in 8 weeks

## 2019-10-21 ENCOUNTER — Encounter: Payer: Self-pay | Admitting: Physical Therapy

## 2019-10-21 ENCOUNTER — Telehealth: Payer: Self-pay | Admitting: Family Medicine

## 2019-10-21 ENCOUNTER — Ambulatory Visit (INDEPENDENT_AMBULATORY_CARE_PROVIDER_SITE_OTHER): Payer: Medicare Other | Admitting: Physical Therapy

## 2019-10-21 DIAGNOSIS — M25662 Stiffness of left knee, not elsewhere classified: Secondary | ICD-10-CM

## 2019-10-21 DIAGNOSIS — R293 Abnormal posture: Secondary | ICD-10-CM | POA: Diagnosis not present

## 2019-10-21 DIAGNOSIS — R2689 Other abnormalities of gait and mobility: Secondary | ICD-10-CM | POA: Diagnosis not present

## 2019-10-21 DIAGNOSIS — R2681 Unsteadiness on feet: Secondary | ICD-10-CM

## 2019-10-21 DIAGNOSIS — R531 Weakness: Secondary | ICD-10-CM | POA: Diagnosis not present

## 2019-10-21 NOTE — Telephone Encounter (Signed)
Copied from [place] [phone]. Topic: General - Other >> Oct 21, 2019 11:50 AM Leim Fabry wrote: Reason for CRM: Patient calling to get approval of incontinence materials([employer]) from PCP [phone] - [employer]

## 2019-10-21 NOTE — Telephone Encounter (Signed)
Patient returning call regarding incontinence materials, missed call from office today due to dr. appointment

## 2019-10-22 LAB — CULTURE, URINE COMPREHENSIVE

## 2019-10-22 NOTE — Telephone Encounter (Signed)
Pt notified again that when I called that ins would not cover.

## 2019-10-22 NOTE — Therapy (Signed)
[facility] [address] [place], [place], [address] Phone: [phone]   Fax:  [phone]  Physical Therapy Treatment  Patient Details  Name: Olivia Romero MRN: [id number] Date of Birth: [birth year] Referring Provider (PT): Meridee Score, MD   Encounter Date: 10/21/2019  PT End of Session - 10/21/19 1526    Visit Number  7    Number of Visits  25    Date for PT Re-Evaluation  12/21/19    Authorization Type  [employer] Medicare, $30 co-pay, no visit limit    PT Start Time  1520    PT Stop Time  1605    PT Time Calculation (min)  45 min    Equipment Utilized During Treatment  Gait belt    Activity Tolerance  Patient tolerated treatment well    Behavior During Therapy  [facility] for tasks assessed/performed       Past Medical History:  Diagnosis Date  . Anemia   . Arthritis   . Asthma   . Colon polyps    adenomatous  . Diabetes mellitus without complication ([place])   . Diabetic infection of left foot ([place]) 12/2018  . Diverticulosis of colon   . Esophagitis   . GERD (gastroesophageal reflux disease)   . Hemorrhoid    internal  . Hyperlipemia   . Hypertension   . IBS (irritable bowel syndrome)    no current prob - diet controlled  . Myalgia due to statin 11/19/2017  . Neuropathy   . Neuropathy of both feet   . Neuropathy of hand   . Pneumonia    x 4  . PONV (postoperative nausea and vomiting)    on some surgeries but not all procedures  . Skin ulcer of right ankle, limited to breakdown of skin ([place]) 12/31/2016   resolved per patient 05/14/19  . Sleep apnea    has had in the past lost 50 pounds and do longer uses cpap  . Statin intolerance 04/23/2015  . Stroke [facility]) 2009   mini stroke per patient  . Stroke-like episode 2009   TIA - mini stroke per patient  . 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 Uncontrolled type 2 diabetes mellitus with hyperglycemia ([place]) 11/01/2017    Past Surgical History:   Procedure Laterality Date  . ABDOMINAL HYSTERECTOMY    . AMPUTATION Left 12/06/2018   Procedure: PARTIAL AMPUTATION LEFT FOOT;  Surgeon: Edrick Kins, DPM;  Location: [place];  Service: Podiatry;  Laterality: Left;  . AMPUTATION Left 05/16/2019   Procedure: LEFT BELOW KNEE AMPUTATION;  Surgeon: Newt Minion, MD;  Location: [place];  Service: Orthopedics;  Laterality: Left;  . BACK SURGERY  09/02/2015  . BONE BIOPSY Left 12/06/2018   Procedure: Bone Biopsy;  Surgeon: Edrick Kins, DPM;  Location: [place];  Service: Podiatry;  Laterality: Left;  . CERVICAL FUSION    . CESAREAN SECTION  1986  . COLONOSCOPY N/A 11/30/2012   Procedure: COLONOSCOPY;  Surgeon: Irene Shipper, MD;  Location: [facility] ENDOSCOPY;  Service: Endoscopy;  Laterality: N/A;  . COLONOSCOPY WITH PROPOFOL N/A 01/03/2016   Procedure: COLONOSCOPY WITH PROPOFOL;  Surgeon: Manya Silvas, MD;  Location: [facility] ENDOSCOPY;  Service: Endoscopy;  Laterality: N/A;  . ESOPHAGOGASTRODUODENOSCOPY (EGD) WITH PROPOFOL N/A 01/14/2015   Procedure: ESOPHAGOGASTRODUODENOSCOPY (EGD) WITH PROPOFOL;  Surgeon: Manya Silvas, MD;  Location: [facility] ENDOSCOPY;  Service: Endoscopy;  Laterality: N/A;  . IRRIGATION AND DEBRIDEMENT FOOT Left 03/13/2018   Procedure: IRRIGATION  AND DEBRIDEMENT FOOT WITH BONE BIOPSY WITH MISONIX DEBRIDER;  Surgeon: Evelina Bucy, DPM;  Location: [place];  Service: Podiatry;  Laterality: Left;  . IRRIGATION AND DEBRIDEMENT FOOT Left 12/06/2018   Procedure: Irrigation And Debridement Foot;  Surgeon: Edrick Kins, DPM;  Location: [place];  Service: Podiatry;  Laterality: Left;  . LEG AMPUTATION BELOW KNEE Left   . LUMBAR WOUND DEBRIDEMENT N/A 10/01/2015   Procedure: LUMBAR WOUND DEBRIDEMENT;  Surgeon: Ashok Pall, MD;  Location: [place] NEURO ORS;  Service: Neurosurgery;  Laterality: N/A;  LUMBAR WOUND DEBRIDEMENT  . NASAL SINUS SURGERY    . SAVORY DILATION N/A 01/14/2015   Procedure: SAVORY DILATION;  Surgeon: Manya Silvas, MD;  Location:  [facility] ENDOSCOPY;  Service: Endoscopy;  Laterality: N/A;  . TONSILLECTOMY    . WISDOM TOOTH EXTRACTION      There were no vitals filed for this visit.  Subjective Assessment - 10/21/19 1522    Subjective  She has been doing the exercises and her back is feeling better.    Pertinent History  L TTA, DM, CVA, sleep apnea, PNA, neuropathy bil. 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  Yes    Pain Score  5     Pain Location  Back    Pain Orientation  Right    Pain Descriptors / Indicators  Aching    Pain Type  Chronic pain    Pain Onset  Other (comment)   has history but flare up in last couple weeks   Pain Frequency  Intermittent    Aggravating Factors   getting up in morning    Pain Relieving Factors  stretches                        [facility] Adult PT Treatment/Exercise - 10/21/19 1530      Bed Mobility   Bed Mobility  Rolling Right;Rolling Left;Left Sidelying to Sit;Sit to Sidelying Left    Rolling Right  Supervision/verbal cueing    Rolling Right Details (indicate cue type and reason)  PT demo & verbal cues on technique with knees flexed    Rolling Left  Supervision/Verbal cueing    Rolling Left Details (indicate cue type and reason)  PT demo & verbal cues on technique with knees flexed    Left Sidelying to Sit  Supervision/Verbal cueing    Left Sidelying to Sit Details (indicate cue type and reason)  PT demo & verbal cues on technique to not strain her back.     Sit to Sidelying Left  Supervision/Verbal cueing    Sit to Sidelying Left Details (indicate cue type and reason)  PT demo & verbal cues on technique to not strain her back.       Transfers   Transfers  Sit to Stand;Stand to [employer] Transfers     Sit to Stand  6: Modified independent (Device/Increase time)    Stand to Sit  6: Modified independent (Device/Increase time)      Ambulation/Gait   Ambulation/Gait  Yes    Ambulation/Gait Assistance  5: Supervision    Ambulation/Gait Assistance Details  verbal & tactile cues on upright posture / looking forward / not staring at floor, step  width & wt shift over prosthesis in stance.     Ambulation Distance (Feet)  125 Feet    Assistive device  Rolling walker;Prosthesis    Gait Pattern  Step-to pattern;Decreased step length - right;Decreased stance time - left;Decreased stride length;Decreased weight shift to left    Ambulation Surface  Level;Indoor      Self-Care   Self-Care  ADL's    ADL's  PT demo & verbal cues on positioning with pillows between LEs in sidelying and for left sidelying place another pillow where ampuated foot so right foot supported.  PT also recommended placing pillow in bottom corners of bed to "tent" cover sheets off foot.  Pt verbalized understanding.       Therapeutic Activites    Therapeutic Activities  ADL's      Exercises   Other Exercises   --      Lumbar Exercises: Stretches   Passive Hamstring Stretch  Right;2 reps;30 seconds    Passive Hamstring Stretch Limitations  seated with RLE in 2nd chair anteriorly with forward flexion    Piriformis Stretch  Right;2 reps;30 seconds    Piriformis Stretch Limitations  modified technique with right foot in another chair anteriorly and light pushing knee downward      Prosthetics   Current prosthetic wear tolerance (days/week)   daily    Current prosthetic wear tolerance (#hours/day)   4 hrs 3x/day with 2 hr break between wears.  PT recommending increase to all awake hours drying q4hrs.      Edema  pitting    Residual limb condition   No open areas,  frail looking skin, pale color, colder temperature, no hair growth (shaving PT advised against) clinderical shape    Education Provided  Skin check;Residual limb  care;Correct ply sock adjustment;Proper Donning;Proper wear schedule/adjustment    Person(s) Educated  Patient    Education Method  Explanation;Verbal cues    Education Method  Verbalized understanding;Needs further instruction    Donning Prosthesis  Supervision    Doffing Prosthesis  Supervision             PT Education - 10/21/19 1615    Education Details  per pt request: UTIs vs bladder infection and water consumption / dehydration.  Using phone to time toileting to assist with incontinence    Person(s) Educated  Patient    Methods  Explanation;Verbal cues    Comprehension  Verbalized understanding       PT Short Term Goals - 10/21/19 2210      PT SHORT TERM GOAL #1   Title  Patient demonstrates proper donning & verbalizes proper cleaning of prosthesis. (All STGs Target Date: 10/23/2019)    Baseline  MET 10/21/2019    Time  1    Period  Months    Status  Achieved    Target Date  10/23/19      PT SHORT TERM GOAL #2   Title  Patient tolerates prosthesis wear >10 hrs total/ day without skin or limb pain issues.    Baseline  MET 10/21/2019    Time  1    Period  Months    Status  Achieved    Target Date  10/23/19      PT SHORT TERM GOAL #3   Title  Patient standing balance static with eyes open 20 seconds within 3 attempts and reaches 7" anteriorly with RW support with supervision.    Time  1    Period  Months    Status  On-going    Target Date  10/23/19      PT SHORT TERM GOAL #4   Title  Patient ambulates 100' with RW & prosthesis with supervision.    Baseline  MET 10/21/2019    Time  1    Period  Months    Status  Achieved    Target Date  10/23/19      PT SHORT TERM GOAL #5   Title  patient negotiates ramps & curbs with rolling walker & prosthesis with supervision.    Time  1    Period  Months    Status  On-going    Target Date  10/23/19        PT Long Term Goals - 10/21/19 2211      PT LONG TERM GOAL #1   Title  Patient verbalizes & demonstrates  proper prosthetic care to enable safe utilization of prosthesis.  (All LTGs Target Date 12/19/2019)    Time  3    Period  Months    Status  On-going    Target Date  12/19/19      PT LONG TERM GOAL #2   Title  Patient tolerates prosthesis wear & right ankle orthosis wear >90% of awake hours to enable function throughout her day.    Time  3    Period  Months    Status  On-going    Target Date  12/19/19      PT LONG TERM GOAL #3   Title  Standing balance: static without UE support eyes open for 30 seconds and dynamic with UE support reaching 10", pick up object from floor, managing clothes & static eyes closed 2 minutes safely modified independent.    Time  3    Period  Months    Status  On-going    Target Date  12/19/19      PT LONG TERM GOAL #4   Title  Patient ambulates 300' including grass with cane or less and prosthesis modified independent for community access.    Time  3    Period  Months    Status  On-going    Target Date  12/19/19      PT LONG TERM GOAL #5   Title  Patient negotiates ramps & curbs with cane or less & prosthesis modified independent for community access.    Time  3    Period  Months    Status  On-going    Target Date  12/19/19            Plan - 10/21/19 1522    Clinical Impression Statement  PT instructed patient in bed mobility and positioning in bed to help decrease LBP which is limiting her mobility.  Patient met 3 STGs that PT checked today.  She is tolerating longer prosthesis wear without skin issues.    Personal Factors and Comorbidities  Comorbidity 3+;Fitness;Time since onset of injury/illness/exacerbation    Comorbidities  L TTA, DM, CVA, sleep apnea, PNA, neuropathy bil. feet and hand, HTN, s/p cervical fusion, s/p back surgery, IBS, asthma, Covid in Jan 2021    Examination-Activity Limitations  Hygiene/Grooming;Locomotion Level;Reach Overhead;Stairs;Stand;Transfers    Examination-Participation Restrictions  Journalist, newspaper  Evolving/Moderate complexity    Rehab Potential  Good    PT Frequency  2x / week    PT Duration  12 weeks    PT Treatment/Interventions  ADLs/Self Care Home Management;DME Instruction;Gait training;Stair training;Functional mobility training;Therapeutic activities;Therapeutic exercise;Balance  training;Neuromuscular re-education;Patient/family education;Orthotic Fit/Training;Prosthetic Training;Vestibular    PT Next Visit Plan  check remaining STGs, standing balance activities, continue to work of gait with rollator walker & RW including ramps & curbs, stairs. How is LBP    PT Home Exercise Plan  at sink for mini squats with UE support, weight shifts with UE support, and balance feet apart no Ue support all with chair behind her    Consulted and Agree with Plan of Care  Patient       Patient will benefit from skilled therapeutic intervention in order to improve the following deficits and impairments:  Abnormal gait, Decreased activity tolerance, Decreased balance, Decreased endurance, Decreased knowledge of use of DME, Decreased mobility, Decreased range of motion, Decreased safety awareness, Decreased strength, Increased edema, Impaired flexibility, Impaired sensation, Postural dysfunction, Prosthetic Dependency, Obesity, Pain  Visit Diagnosis: Other abnormalities of gait and mobility  Unsteadiness on feet  Abnormal posture  Weakness generalized  Stiffness of left knee, not elsewhere classified     Problem List Patient Active Problem List   Diagnosis Date Noted  . History of below knee amputation, left ([place]) 07/30/2019  . Gangrene of left foot ([place]) 05/16/2019  . Subacute osteomyelitis of left foot ([place])   . Charcot's joint of foot, left   . Post-operative state   . PAD (peripheral artery disease) ([place]) 12/05/2018  . Osteomyelitis of ankle or foot, acute, left ([place]) 12/05/2018  . MRSA bacteremia 12/05/2018  . Sepsis without acute organ  dysfunction ([place])   . Charcot's joint of foot due to diabetes ([place]) 09/18/2018  . BMI 36.0-36.9,adult   . Morbid obesity ([place]) 11/19/2017  . B12 deficiency 11/01/2017  . Diabetic foot ulcer ([place]) 12/14/2016  . Diabetic polyneuropathy associated with type 2 diabetes mellitus ([place]) 12/13/2015  . Lumbar stenosis with neurogenic claudication 09/02/2015  . Chronic constipation 07/13/2015  . OSA (obstructive sleep apnea) 06/25/2015  . 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/23/2015  . Hypertriglyceridemia 04/23/2015  . Asthma, mild intermittent 02/01/2015  . Type 2 diabetes mellitus with diabetic nephropathy ([place]) 12/31/2014  . Gastroesophageal reflux disease with esophagitis 12/31/2014  . Dysphagia 12/31/2014  . Bilateral carpal tunnel syndrome 12/01/2014  . Diverticulosis of colon (without mention of hemorrhage) 11/30/2012  . Essential hypertension, benign 11/28/2012    Jamey Reas PT, DPT 10/22/2019, 10:15 PM  [facility] Physical Therapy [address] [place], [place], [address] Phone: [phone]   Fax:  [phone]  Name: MARIAN MENEELY MRN: [id number] Date of Birth: [birth year]

## 2019-10-27 ENCOUNTER — Ambulatory Visit (INDEPENDENT_AMBULATORY_CARE_PROVIDER_SITE_OTHER): Payer: Medicare Other | Admitting: Podiatry

## 2019-10-27 ENCOUNTER — Other Ambulatory Visit: Payer: Self-pay

## 2019-10-27 ENCOUNTER — Encounter: Payer: Self-pay | Admitting: Podiatry

## 2019-10-27 DIAGNOSIS — M79674 Pain in right toe(s): Secondary | ICD-10-CM | POA: Diagnosis not present

## 2019-10-27 DIAGNOSIS — E1149 Type 2 diabetes mellitus with other diabetic neurological complication: Secondary | ICD-10-CM

## 2019-10-27 DIAGNOSIS — B351 Tinea unguium: Secondary | ICD-10-CM | POA: Diagnosis not present

## 2019-10-28 ENCOUNTER — Encounter: Payer: Medicare Other | Admitting: Physical Therapy

## 2019-10-28 NOTE — Progress Notes (Signed)
Subjective: 64 y.o. returns the office today for painful, elongated, thickened toenails which she cannot trim herself as well as her right foot check.  She states that she is doing well does not see any open sores.  She does try heat moisturizer on her feet daily. Denies any systemic complaints such as fevers, chills, nausea, vomiting.   PCP: Delsa Grana, PA-C  Objective: AAO 3, NAD DP/PT pulses palpable, CRT less than 3 seconds Left below-knee amputation Protective sensation decreased with Derrel Nip monofilament, Nails hypertrophic, dystrophic, elongated, brittle, discolored 5. There is tenderness overlying the nails 1-5 on the right. There is no surrounding erythema or drainage along the nail sites. Prominence of the fourth metatarsal head plantarly with very minimal hyperkeratotic tissue.  There is no open lesions identified. No pain with calf compression, swelling, warmth, erythema.  Assessment: Patient presents with symptomatic onychomycosis  Plan: -Treatment options including alternatives, risks, complications were discussed -Nails sharply debrided 5 without complication/bleeding. -Continue to monitor right foot daily.  Moisturizer daily.  If there is any significant callus formation or any open lesions toenail immediately. -Continue [place] brace on the right side for stability and offloading. -Discussed daily foot inspection. If there are any changes, to call the office immediately.  -Follow-up in 2 to 3 months or sooner if any problems are to arise. In the meantime, encouraged to call the office with any questions, concerns, changes symptoms.  Celesta Gentile, DPM

## 2019-10-30 ENCOUNTER — Ambulatory Visit (INDEPENDENT_AMBULATORY_CARE_PROVIDER_SITE_OTHER): Payer: Medicare Other | Admitting: Physical Therapy

## 2019-10-30 ENCOUNTER — Other Ambulatory Visit: Payer: Self-pay

## 2019-10-30 DIAGNOSIS — R293 Abnormal posture: Secondary | ICD-10-CM

## 2019-10-30 DIAGNOSIS — Z9181 History of falling: Secondary | ICD-10-CM

## 2019-10-30 DIAGNOSIS — R2689 Other abnormalities of gait and mobility: Secondary | ICD-10-CM

## 2019-10-30 DIAGNOSIS — R2681 Unsteadiness on feet: Secondary | ICD-10-CM | POA: Diagnosis not present

## 2019-10-30 DIAGNOSIS — M25662 Stiffness of left knee, not elsewhere classified: Secondary | ICD-10-CM

## 2019-10-30 DIAGNOSIS — R531 Weakness: Secondary | ICD-10-CM | POA: Diagnosis not present

## 2019-10-30 NOTE — Therapy (Signed)
[facility] Physical Therapy [address] [place], [place], [address] Phone: [phone]   Fax:  [phone]  Physical Therapy Treatment  Patient Details  Name: Olivia Romero MRN: [id number] Date of Birth: [birth year] Referring Provider (PT): Meridee Score, MD   Encounter Date: 10/30/2019  PT End of Session - 10/30/19 1846    Visit Number  8    Number of Visits  25    Date for PT Re-Evaluation  12/21/19    Authorization Type  [employer] Medicare, $30 co-pay, no visit limit    PT Start Time  1300    PT Stop Time  1345    PT Time Calculation (min)  45 min    Equipment Utilized During Treatment  Gait belt    Activity Tolerance  Patient tolerated treatment well    Behavior During Therapy  [facility] for tasks assessed/performed       Past Medical History:  Diagnosis Date  . Anemia   . Arthritis   . Asthma   . Colon polyps    adenomatous  . Diabetes mellitus without complication ([place])   . Diabetic infection of left foot ([place]) 12/2018  . Diverticulosis of colon   . Esophagitis   . GERD (gastroesophageal reflux disease)   . Hemorrhoid    internal  . Hyperlipemia   . Hypertension   . IBS (irritable bowel syndrome)    no current prob - diet controlled  . Myalgia due to statin 11/19/2017  . Neuropathy   . Neuropathy of both feet   . Neuropathy of hand   . Pneumonia    x 4  . PONV (postoperative nausea and vomiting)    on some surgeries but not all procedures  . Skin ulcer of right ankle, limited to breakdown of skin ([place]) 12/31/2016   resolved per patient 05/14/19  . Sleep apnea    has had in the past lost 50 pounds and do longer uses cpap  . Statin intolerance 04/23/2015  . Stroke [facility]) 2009   mini stroke per patient  . Stroke-like episode 2009   TIA - mini stroke per patient  . 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 Uncontrolled type 2 diabetes mellitus with hyperglycemia ([place]) 11/01/2017    Past Surgical History:   Procedure Laterality Date  . ABDOMINAL HYSTERECTOMY    . AMPUTATION Left 12/06/2018   Procedure: PARTIAL AMPUTATION LEFT FOOT;  Surgeon: Edrick Kins, DPM;  Location: [place];  Service: Podiatry;  Laterality: Left;  . AMPUTATION Left 05/16/2019   Procedure: LEFT BELOW KNEE AMPUTATION;  Surgeon: Newt Minion, MD;  Location: [place];  Service: Orthopedics;  Laterality: Left;  . BACK SURGERY  09/02/2015  . BONE BIOPSY Left 12/06/2018   Procedure: Bone Biopsy;  Surgeon: Edrick Kins, DPM;  Location: [place];  Service: Podiatry;  Laterality: Left;  . CERVICAL FUSION    . CESAREAN SECTION  1986  . COLONOSCOPY N/A 11/30/2012   Procedure: COLONOSCOPY;  Surgeon: Irene Shipper, MD;  Location: [facility]OSCOPY;  Service: Endoscopy;  Laterality: N/A;  . COLONOSCOPY WITH PROPOFOL N/A 01/03/2016   Procedure: COLONOSCOPY WITH PROPOFOL;  Surgeon: Manya Silvas, MD;  Location: [facility] ENDOSCOPY;  Service: Endoscopy;  Laterality: N/A;  . ESOPHAGOGASTRODUODENOSCOPY (EGD) WITH PROPOFOL N/A 01/14/2015   Procedure: ESOPHAGOGASTRODUODENOSCOPY (EGD) WITH PROPOFOL;  Surgeon: Manya Silvas, MD;  Location: [facility] ENDOSCOPY;  Service: Endoscopy;  Laterality: N/A;  . IRRIGATION AND DEBRIDEMENT FOOT Left 03/13/2018   Procedure: IRRIGATION  AND DEBRIDEMENT FOOT WITH BONE BIOPSY WITH MISONIX DEBRIDER;  Surgeon: Evelina Bucy, DPM;  Location: [place];  Service: Podiatry;  Laterality: Left;  . IRRIGATION AND DEBRIDEMENT FOOT Left 12/06/2018   Procedure: Irrigation And Debridement Foot;  Surgeon: Edrick Kins, DPM;  Location: [place];  Service: Podiatry;  Laterality: Left;  . LEG AMPUTATION BELOW KNEE Left   . LUMBAR WOUND DEBRIDEMENT N/A 10/01/2015   Procedure: LUMBAR WOUND DEBRIDEMENT;  Surgeon: Ashok Pall, MD;  Location: [place] [facility];  Service: Neurosurgery;  Laterality: N/A;  LUMBAR WOUND DEBRIDEMENT  . NASAL SINUS SURGERY    . SAVORY DILATION N/A 01/14/2015   Procedure: SAVORY DILATION;  Surgeon: Manya Silvas, MD;  Location:  [facility] ENDOSCOPY;  Service: Endoscopy;  Laterality: N/A;  . TONSILLECTOMY    . WISDOM TOOTH EXTRACTION      There were no vitals filed for this visit.  Subjective Assessment - 10/30/19 1842    Subjective  Her back is not bothering her much. She does relay some itching in her residual limb.    Pertinent History  L TTA, DM, CVA, sleep apnea, PNA, neuropathy bil. 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, in community    Pain Onset  Other (comment)   has history but flare up in last couple weeks                       [facility] Adult PT Treatment/Exercise - 10/30/19 0001      Transfers   Transfers  Sit to Stand;Stand to Sit;Stand Pivot Transfers    Sit to Stand  6: Modified independent (Device/Increase time)    Stand to Sit  6: Modified independent (Device/Increase time)      Ambulation/Gait   Ambulation/Gait  Yes    Ambulation/Gait Assistance  5: Supervision    Ambulation/Gait Assistance Details  verbal cues to loook up    Ambulation Distance (Feet)  150 Feet    Assistive device  Rolling walker;Prosthesis    Gait Pattern  Step-to pattern;Decreased step length - right;Decreased stance time - left;Decreased stride length;Decreased weight shift to left    Ambulation Surface  Level;Indoor      Self-Care   Self-Care  Other Self-Care Comments    Other Self-Care Comments   wicking layer under liner self care education provided by Joycie Peek PT      Exercises   Other Exercises   Nu step L4 X 6 min for endurance and knee ROM, Leg press Double leg 75 lbs 3X10 then dropped down to single leg 25 lbs 3X10. Sit to stands from raised mat table without UE support X 10 reps with close supervision               PT Short Term Goals - 10/21/19 2210       PT SHORT TERM GOAL #1   Title  Patient demonstrates proper donning & verbalizes proper cleaning of prosthesis. (All STGs Target Date: 10/23/2019)    Baseline  MET 10/21/2019    Time  1    Period  Months    Status  Achieved    Target Date  10/23/19      PT SHORT TERM GOAL #2   Title  Patient tolerates prosthesis wear >10  hrs total/ day without skin or limb pain issues.    Baseline  MET 10/21/2019    Time  1    Period  Months    Status  Achieved    Target Date  10/23/19      PT SHORT TERM GOAL #3   Title  Patient standing balance static with eyes open 20 seconds within 3 attempts and reaches 7" anteriorly with RW support with supervision.    Time  1    Period  Months    Status  On-going    Target Date  10/23/19      PT SHORT TERM GOAL #4   Title  Patient ambulates 100' with RW & prosthesis with supervision.    Baseline  MET 10/21/2019    Time  1    Period  Months    Status  Achieved    Target Date  10/23/19      PT SHORT TERM GOAL #5   Title  patient negotiates ramps & curbs with rolling walker & prosthesis with supervision.    Time  1    Period  Months    Status  On-going    Target Date  10/23/19        PT Long Term Goals - 10/21/19 2211      PT LONG TERM GOAL #1   Title  Patient verbalizes & demonstrates proper prosthetic care to enable safe utilization of prosthesis.  (All LTGs Target Date 12/19/2019)    Time  3    Period  Months    Status  On-going    Target Date  12/19/19      PT LONG TERM GOAL #2   Title  Patient tolerates prosthesis wear & right ankle orthosis wear >90% of awake hours to enable function throughout her day.    Time  3    Period  Months    Status  On-going    Target Date  12/19/19      PT LONG TERM GOAL #3   Title  Standing balance: static without UE support eyes open for 30 seconds and dynamic with UE support reaching 10", pick up object from floor, managing clothes & static eyes closed 2 minutes safely modified independent.    Time  3     Period  Months    Status  On-going    Target Date  12/19/19      PT LONG TERM GOAL #4   Title  Patient ambulates 300' including grass with cane or less and prosthesis modified independent for community access.    Time  3    Period  Months    Status  On-going    Target Date  12/19/19      PT LONG TERM GOAL #5   Title  Patient negotiates ramps & curbs with cane or less & prosthesis modified independent for community access.    Time  3    Period  Months    Status  On-going    Target Date  12/19/19            Plan - 10/30/19 1846    Clinical Impression Statement  Session focused on overall endurance, gait tolerance, leg strength/ROM, and prosthetic self care. She is progressing well with PT overall.    Personal Factors and Comorbidities  Comorbidity 3+;Fitness;Time since onset of injury/illness/exacerbation    Comorbidities  L TTA, DM, CVA, sleep apnea, PNA, neuropathy bil. feet and hand, HTN, s/p cervical fusion, s/p back  surgery, IBS, asthma, Covid in Jan 2021    Examination-Activity Limitations  Hygiene/Grooming;Locomotion Level;Reach Overhead;Stairs;Stand;Transfers    Examination-Participation Restrictions  Recruitment consultant  Evolving/Moderate complexity    Rehab Potential  Good    PT Frequency  2x / week    PT Duration  12 weeks    PT Treatment/Interventions  ADLs/Self Care Home Management;DME Instruction;Gait training;Stair training;Functional mobility training;Therapeutic activities;Therapeutic exercise;Balance training;Neuromuscular re-education;Patient/family education;Orthotic Fit/Training;Prosthetic Training;Vestibular    PT Next Visit Plan  check remaining STGs, standing balance activities, continue to work of gait with rollator walker & RW including ramps & curbs, stairs. How is LBP    PT Home Exercise Plan  at sink for mini squats with UE support, weight shifts with UE support, and balance feet apart no Ue support all  with chair behind her    Consulted and Agree with Plan of Care  Patient       Patient will benefit from skilled therapeutic intervention in order to improve the following deficits and impairments:  Abnormal gait, Decreased activity tolerance, Decreased balance, Decreased endurance, Decreased knowledge of use of DME, Decreased mobility, Decreased range of motion, Decreased safety awareness, Decreased strength, Increased edema, Impaired flexibility, Impaired sensation, Postural dysfunction, Prosthetic Dependency, Obesity, Pain  Visit Diagnosis: Other abnormalities of gait and mobility  Unsteadiness on feet  Abnormal posture  Weakness generalized  Stiffness of left knee, not elsewhere classified  History of fall     Problem List Patient Active Problem List   Diagnosis Date Noted  . History of below knee amputation, left ([place]) 07/30/2019  . Gangrene of left foot ([place]) 05/16/2019  . Subacute osteomyelitis of left foot ([place])   . Charcot's joint of foot, left   . Post-operative state   . PAD (peripheral artery disease) ([place]) 12/05/2018  . Osteomyelitis of ankle or foot, acute, left ([place]) 12/05/2018  . MRSA bacteremia 12/05/2018  . Sepsis without acute organ dysfunction ([place])   . Charcot's joint of foot due to diabetes ([place]) 09/18/2018  . BMI 36.0-36.9,adult   . Morbid obesity ([place]) 11/19/2017  . B12 deficiency 11/01/2017  . Diabetic foot ulcer ([place]) 12/14/2016  . Diabetic polyneuropathy associated with type 2 diabetes mellitus ([place]) 12/13/2015  . Lumbar stenosis with neurogenic claudication 09/02/2015  . Chronic constipation 07/13/2015  . OSA (obstructive sleep apnea) 06/25/2015  . 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/23/2015  . Hypertriglyceridemia 04/23/2015  . Asthma, mild intermittent 02/01/2015  . Type 2 diabetes mellitus with diabetic nephropathy ([place]) 12/31/2014  . Gastroesophageal reflux disease with esophagitis 12/31/2014  . Dysphagia 12/31/2014  .  Bilateral carpal tunnel syndrome 12/01/2014  . Diverticulosis of colon (without mention of hemorrhage) 11/30/2012  . Essential hypertension, benign 11/28/2012    Silvestre Mesi 10/30/2019, 6:48 PM  [facility] Physical Therapy [address] [place], [place], [address] Phone: [phone]   Fax:  [phone]  Name: ZANARIA MORELL MRN: [id number] Date of Birth: [birth year]

## 2019-11-04 ENCOUNTER — Other Ambulatory Visit: Payer: Self-pay

## 2019-11-04 ENCOUNTER — Ambulatory Visit (INDEPENDENT_AMBULATORY_CARE_PROVIDER_SITE_OTHER): Payer: Medicare Other | Admitting: Physical Therapy

## 2019-11-04 ENCOUNTER — Encounter: Payer: Self-pay | Admitting: Physical Therapy

## 2019-11-04 DIAGNOSIS — Z9181 History of falling: Secondary | ICD-10-CM

## 2019-11-04 DIAGNOSIS — R531 Weakness: Secondary | ICD-10-CM

## 2019-11-04 DIAGNOSIS — R293 Abnormal posture: Secondary | ICD-10-CM | POA: Diagnosis not present

## 2019-11-04 DIAGNOSIS — R2681 Unsteadiness on feet: Secondary | ICD-10-CM | POA: Diagnosis not present

## 2019-11-04 DIAGNOSIS — R2689 Other abnormalities of gait and mobility: Secondary | ICD-10-CM

## 2019-11-04 DIAGNOSIS — M25662 Stiffness of left knee, not elsewhere classified: Secondary | ICD-10-CM

## 2019-11-04 NOTE — Therapy (Signed)
[facility] Physical Therapy [address] [place], [place], [address] Phone: [phone]   Fax:  [phone]  Physical Therapy Treatment  Patient Details  Name: Olivia Romero MRN: [id number] Date of Birth: [birth year] Referring Provider (PT): Meridee Score, MD   Encounter Date: 11/04/2019  PT End of Session - 11/04/19 1621    Visit Number  9    Number of Visits  25    Date for PT Re-Evaluation  12/21/19    Authorization Type  [employer] Medicare, $30 co-pay, no visit limit    PT Start Time  1515    PT Stop Time  1609    PT Time Calculation (min)  54 min    Equipment Utilized During Treatment  Gait belt    Activity Tolerance  Patient tolerated treatment well    Behavior During Therapy  [facility] for tasks assessed/performed       Past Medical History:  Diagnosis Date  . Anemia   . Arthritis   . Asthma   . Colon polyps    adenomatous  . Diabetes mellitus without complication ([place])   . Diabetic infection of left foot ([place]) 12/2018  . Diverticulosis of colon   . Esophagitis   . GERD (gastroesophageal reflux disease)   . Hemorrhoid    internal  . Hyperlipemia   . Hypertension   . IBS (irritable bowel syndrome)    no current prob - diet controlled  . Myalgia due to statin 11/19/2017  . Neuropathy   . Neuropathy of both feet   . Neuropathy of hand   . Pneumonia    x 4  . PONV (postoperative nausea and vomiting)    on some surgeries but not all procedures  . Skin ulcer of right ankle, limited to breakdown of skin ([place]) 12/31/2016   resolved per patient 05/14/19  . Sleep apnea    has had in the past lost 50 pounds and do longer uses cpap  . Statin intolerance 04/23/2015  . Stroke [facility]) 2009   mini stroke per patient  . Stroke-like episode 2009   TIA - mini stroke per patient  . 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 ([place]) 07/13/2015  . Uncontrolled type 2 diabetes mellitus with hyperglycemia ([place]) 11/01/2017    Past Surgical History:   Procedure Laterality Date  . ABDOMINAL HYSTERECTOMY    . AMPUTATION Left 12/06/2018   Procedure: PARTIAL AMPUTATION LEFT FOOT;  Surgeon: Edrick Kins, DPM;  Location: [place];  Service: Podiatry;  Laterality: Left;  . AMPUTATION Left 05/16/2019   Procedure: LEFT BELOW KNEE AMPUTATION;  Surgeon: Newt Minion, MD;  Location: [place];  Service: Orthopedics;  Laterality: Left;  . BACK SURGERY  09/02/2015  . BONE BIOPSY Left 12/06/2018   Procedure: Bone Biopsy;  Surgeon: Edrick Kins, DPM;  Location: [place];  Service: Podiatry;  Laterality: Left;  . CERVICAL FUSION    . CESAREAN SECTION  1986  . COLONOSCOPY N/A 11/30/2012   Procedure: COLONOSCOPY;  Surgeon: Irene Shipper, MD;  Location: [facility] ENDOSCOPY;  Service: Endoscopy;  Laterality: N/A;  . COLONOSCOPY WITH PROPOFOL N/A 01/03/2016   Procedure: COLONOSCOPY WITH PROPOFOL;  Surgeon: Manya Silvas, MD;  Location: [facility] ENDOSCOPY;  Service: Endoscopy;  Laterality: N/A;  . ESOPHAGOGASTRODUODENOSCOPY (EGD) WITH PROPOFOL N/A 01/14/2015   Procedure: ESOPHAGOGASTRODUODENOSCOPY (EGD) WITH PROPOFOL;  Surgeon: Manya Silvas, MD;  Location: [facility] ENDOSCOPY;  Service: Endoscopy;  Laterality: N/A;  . IRRIGATION AND DEBRIDEMENT FOOT Left 03/13/2018   Procedure: IRRIGATION  AND DEBRIDEMENT FOOT WITH BONE BIOPSY WITH MISONIX DEBRIDER;  Surgeon: Evelina Bucy, DPM;  Location: [place];  Service: Podiatry;  Laterality: Left;  . IRRIGATION AND DEBRIDEMENT FOOT Left 12/06/2018   Procedure: Irrigation And Debridement Foot;  Surgeon: Edrick Kins, DPM;  Location: [place];  Service: Podiatry;  Laterality: Left;  . LEG AMPUTATION BELOW KNEE Left   . LUMBAR WOUND DEBRIDEMENT N/A 10/01/2015   Procedure: LUMBAR WOUND DEBRIDEMENT;  Surgeon: Ashok Pall, MD;  Location: [place] NEURO ORS;  Service: Neurosurgery;  Laterality: N/A;  LUMBAR WOUND DEBRIDEMENT  . NASAL SINUS SURGERY    . SAVORY DILATION N/A 01/14/2015   Procedure: SAVORY DILATION;  Surgeon: Manya Silvas, MD;  Location:  [facility] ENDOSCOPY;  Service: Endoscopy;  Laterality: N/A;  . TONSILLECTOMY    . WISDOM TOOTH EXTRACTION      There were no vitals filed for this visit.  Subjective Assessment - 11/04/19 1517    Subjective  She fell 3 days ago when moving with prosthesis off. She has bruising on right forearm & left knee.  She wears liner from arising to bedtime and pops prosthesis off 30-35 minutes 3-4 times / day.    Pertinent History  L TTA, DM, CVA, sleep apnea, PNA, neuropathy bil. 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  No/denies    Pain Onset  Other (comment)   has history but flare up in last couple weeks                       [facility] Adult PT Treatment/Exercise - 11/04/19 1515      Transfers   Transfers  Sit to Stand;Stand to [employer] Transfers    Sit to Stand  5: Supervision;With upper extremity assist;With armrests;From chair/3-in-1   to forearm crutches   Stand to Sit  5: Supervision;With upper extremity assist;With armrests;To chair/3-in-1   from forearm crutches     Ambulation/Gait   Ambulation/Gait  Yes    Ambulation/Gait Assistance  4: Min guard    Ambulation/Gait Assistance Details  verbal & tactile cues on forearm crutch use with 3-point pattern. Verbal cues on upright posture (not staring at floor)    Ambulation Distance (Feet)  150 Feet    Assistive device  Prosthesis;Rolling walker;Lofstrands   enter with RW & exit with forearm crutches   Gait Pattern  Step-to pattern;Decreased step length - right;Decreased stance time - left;Decreased stride length;Decreased weight shift to left    Ambulation Surface  Level;Indoor      Self-Care   Self-Care  --    Other Self-Care Comments   --      Exercises    Other Exercises   --      Prosthetics   Prosthetic Care Comments   PT recommending pre-tibial pads. Pt to set up appt with prosthetist.  fall risk when prosthesis off - recommend not "popping" prosthesis off when resting. Only remove if getting in bed to rest so subconcious movements do not result in fall.      Current prosthetic wear tolerance (days/week)   daily    Current prosthetic wear tolerance (#hours/day)   liner most of awake hours. prosthesis awake hours except removes 30-40 minutes ~4  x/day    Residual limb condition   No open areas,  frail looking skin, pale color, colder temperature, no hair growth (shaving PT advised against) clinderical shape    Education Provided  Skin check;Residual limb care;Other (comment)   see prosthetic care comments   Person(s) Educated  Patient    Education Method  Explanation;Verbal cues    Education Method  Verbalized understanding;Needs further instruction    Donning Prosthesis  Supervision    Doffing Prosthesis  Supervision             PT Education - 11/04/19 1624    Education Details  HEP for posture & balance - see pt instructions    Person(s) Educated  Patient    Methods  Explanation;Demonstration;Tactile cues;Verbal cues;Handout    Comprehension  Verbalized understanding;Returned demonstration;Verbal cues required;Tactile cues required;Need further instruction       PT Short Term Goals - 11/04/19 1623      PT SHORT TERM GOAL #1   Title  Patient demonstrates proper donning & verbalizes proper cleaning of prosthesis. (All STGs Target Date: 10/23/2019)    Baseline  MET 10/21/2019    Time  1    Period  Months    Status  Achieved    Target Date  10/23/19      PT SHORT TERM GOAL #2   Title  Patient tolerates prosthesis wear >10 hrs total/ day without skin or limb pain issues.    Baseline  MET 10/21/2019    Time  1    Period  Months    Status  Achieved    Target Date  10/23/19      PT SHORT TERM GOAL #3   Title  Patient  standing balance static with eyes open 20 seconds within 3 attempts and reaches 7" anteriorly with RW support with supervision.    Baseline  Partially MET 11/04/2019 static eyes open 16 seconds & reaches 7" with RW support.    Time  1    Period  Months    Status  Partially Met    Target Date  10/23/19      PT SHORT TERM GOAL #4   Title  Patient ambulates 100' with RW & prosthesis with supervision.    Baseline  MET 10/21/2019    Time  1    Period  Months    Status  Achieved    Target Date  10/23/19      PT SHORT TERM GOAL #5   Title  patient negotiates ramps & curbs with rolling walker & prosthesis with supervision.    Time  1    Period  Months    Status  Achieved    Target Date  10/23/19        PT Short Term Goals - 11/04/19 1634      PT SHORT TERM GOAL #1   Title  Patient verbalizes understanding of adjusting ply socks with prosthesis wear. (All STGs Target Date: 11/28/2019)    Time  1    Period  Months    Status  Revised    Target Date  11/28/19      PT SHORT TERM GOAL #2   Title  Patient tolerates prosthesis wear >12 hrs total/ day without skin or limb pain issues.    Time  1    Period  Months    Status  Revised    Target Date  11/28/19      PT SHORT TERM GOAL #3   Title  Patient standing balance static with eyes open 20 seconds within 3 attempts and turns head right/left/up/down without UE support with supervision.    Time  1    Period  Months    Status  Revised    Target Date  11/28/19      PT SHORT TERM GOAL #4   Title  Patient ambulates 200' with forearm crutches & prosthesis with supervision.    Time  1    Period  Months    Status  Revised    Target Date  11/28/19      PT SHORT TERM GOAL #5   Title  patient negotiates ramps & curbs with forearm crutches & prosthesis with supervision.    Time  1    Period  Months    Status  Revised    Target Date  11/28/19        PT Long Term Goals - 10/21/19 2211      PT LONG TERM GOAL #1   Title  Patient  verbalizes & demonstrates proper prosthetic care to enable safe utilization of prosthesis.  (All LTGs Target Date 12/19/2019)    Time  3    Period  Months    Status  On-going    Target Date  12/19/19      PT LONG TERM GOAL #2   Title  Patient tolerates prosthesis wear & right ankle orthosis wear >90% of awake hours to enable function throughout her day.    Time  3    Period  Months    Status  On-going    Target Date  12/19/19      PT LONG TERM GOAL #3   Title  Standing balance: static without UE support eyes open for 30 seconds and dynamic with UE support reaching 10", pick up object from floor, managing clothes & static eyes closed 2 minutes safely modified independent.    Time  3    Period  Months    Status  On-going    Target Date  12/19/19      PT LONG TERM GOAL #4   Title  Patient ambulates 300' including grass with cane or less and prosthesis modified independent for community access.    Time  3    Period  Months    Status  On-going    Target Date  12/19/19      PT LONG TERM GOAL #5   Title  Patient negotiates ramps & curbs with cane or less & prosthesis modified independent for community access.    Time  3    Period  Months    Status  On-going    Target Date  12/19/19            Plan - 11/04/19 1622    Clinical Impression Statement  PT session focused on standing balance & posture activities that PT added to her HEP.  PT also worked on prosthetic gait with forearm crutches to increase her balance reactions & facilitate upright step through pattern. Also this gait will progress to single UE support better.    Personal Factors and Comorbidities  Comorbidity 3+;Fitness;Time since onset of injury/illness/exacerbation    Comorbidities  L TTA, DM, CVA, sleep apnea, PNA, neuropathy bil. feet and hand, HTN, s/p cervical fusion, s/p back surgery, IBS, asthma, Covid in Jan 2021    Examination-Activity Limitations  Hygiene/Grooming;Locomotion Level;Reach  Overhead;Stairs;Stand;Transfers    Examination-Participation Restrictions  Community Activity;Volunteer    Stability/Clinical Decision Making  Evolving/Moderate complexity  Rehab Potential  Good    PT Frequency  2x / week    PT Duration  12 weeks    PT Treatment/Interventions  ADLs/Self Care Home Management;DME Instruction;Gait training;Stair training;Functional mobility training;Therapeutic activities;Therapeutic exercise;Balance training;Neuromuscular re-education;Patient/family education;Orthotic Fit/Training;Prosthetic Training;Vestibular    PT Next Visit Plan  do 10th visit note,  standing balance activities, continue to work of gait with rollator walker & RW including ramps & curbs, stairs. How is LBP    PT Home Exercise Plan  at sink for mini squats with UE support, weight shifts with UE support, and balance feet apart no Ue support all with chair behind her    Consulted and Agree with Plan of Care  Patient       Patient will benefit from skilled therapeutic intervention in order to improve the following deficits and impairments:  Abnormal gait, Decreased activity tolerance, Decreased balance, Decreased endurance, Decreased knowledge of use of DME, Decreased mobility, Decreased range of motion, Decreased safety awareness, Decreased strength, Increased edema, Impaired flexibility, Impaired sensation, Postural dysfunction, Prosthetic Dependency, Obesity, Pain  Visit Diagnosis: Unsteadiness on feet  Other abnormalities of gait and mobility  Abnormal posture  Weakness generalized  History of fall  Stiffness of left knee, not elsewhere classified     Problem List Patient Active Problem List   Diagnosis Date Noted  . History of below knee amputation, left ([place]) 07/30/2019  . Gangrene of left foot ([place]) 05/16/2019  . Subacute osteomyelitis of left foot ([place])   . Charcot's joint of foot, left   . Post-operative state   . PAD (peripheral artery disease) ([place]) 12/05/2018  .  Osteomyelitis of ankle or foot, acute, left ([place]) 12/05/2018  . MRSA bacteremia 12/05/2018  . Sepsis without acute organ dysfunction ([place])   . Charcot's joint of foot due to diabetes ([place]) 09/18/2018  . BMI 36.0-36.9,adult   . Morbid obesity ([place]) 11/19/2017  . B12 deficiency 11/01/2017  . Diabetic foot ulcer ([place]) 12/14/2016  . Diabetic polyneuropathy associated with type 2 diabetes mellitus ([place]) 12/13/2015  . Lumbar stenosis with neurogenic claudication 09/02/2015  . Chronic constipation 07/13/2015  . OSA (obstructive sleep apnea) 06/25/2015  . 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/23/2015  . Hypertriglyceridemia 04/23/2015  . Asthma, mild intermittent 02/01/2015  . Type 2 diabetes mellitus with diabetic nephropathy ([place]) 12/31/2014  . Gastroesophageal reflux disease with esophagitis 12/31/2014  . Dysphagia 12/31/2014  . Bilateral carpal tunnel syndrome 12/01/2014  . Diverticulosis of colon (without mention of hemorrhage) 11/30/2012  . Essential hypertension, benign 11/28/2012    Jamey Reas PT, DPT 11/04/2019, 4:34 PM  [facility] Physical Therapy [address] [place], [place], [address] Phone: [phone]   Fax:  [phone]  Name: Olivia Romero MRN: [id number] Date of Birth: 01-08-1956

## 2019-11-04 NOTE — Patient Instructions (Signed)
Stand in corner with walker in front of you. 1. Stand without holding. Goal is 60 seconds without touching. Do 3-5 reps. 2. Touch one finger tip per hand on outside of walker, turn head to look right & left. 10 reps 3. Touch one finger tip per hand on outside of walker, turn head to look up & down. 10 reps 4. Touch one finger tip per hand on outside of walker, close your eyes for up to 10 seconds. 5-10 reps  Stand with back side to counter top & walker in front of you, place hands on counter beside your hips. Push up tall, look forward, keep heels on floor. Try to hold for 60 seconds 2-3 reps.  Stand at door frame with walker in front of you. Reach right hand behind your head & slide up if able, hold 2-3 deep breaths. Reach left hand behind your head & slide up if able, hold 2-3 deep breaths. Repeat on each arm for 2 reps.  Reach both hands behind your head, hold 2-3 deep breaths. Do 2 reps.

## 2019-11-06 ENCOUNTER — Ambulatory Visit (INDEPENDENT_AMBULATORY_CARE_PROVIDER_SITE_OTHER): Payer: Medicare Other | Admitting: Physical Therapy

## 2019-11-06 ENCOUNTER — Other Ambulatory Visit: Payer: Self-pay

## 2019-11-06 ENCOUNTER — Encounter: Payer: Self-pay | Admitting: Physical Therapy

## 2019-11-06 DIAGNOSIS — R2689 Other abnormalities of gait and mobility: Secondary | ICD-10-CM | POA: Diagnosis not present

## 2019-11-06 DIAGNOSIS — R293 Abnormal posture: Secondary | ICD-10-CM | POA: Diagnosis not present

## 2019-11-06 DIAGNOSIS — M25662 Stiffness of left knee, not elsewhere classified: Secondary | ICD-10-CM

## 2019-11-06 DIAGNOSIS — R2681 Unsteadiness on feet: Secondary | ICD-10-CM

## 2019-11-06 DIAGNOSIS — R531 Weakness: Secondary | ICD-10-CM | POA: Diagnosis not present

## 2019-11-06 DIAGNOSIS — Z9181 History of falling: Secondary | ICD-10-CM

## 2019-11-06 NOTE — Therapy (Addendum)
[facility] [address] [place], [place], [address] Phone: [phone]   Fax:  [phone]  Physical Therapy Treatment & 10th Visit Progress Note  Patient Details  Name: Olivia Romero MRN: [id number] Date of Birth: 1955-06-21 Referring Provider (PT): Meridee Score, MD   Encounter Date: 11/06/2019  Progress Note Reporting Period 09/22/2019 to 11/06/2019  See note below for Objective Data and Assessment of Progress/Goals.       PT End of Session - 11/06/19 0939    Visit Number  10    Number of Visits  25    Date for PT Re-Evaluation  12/21/19    Authorization Type  [employer] Medicare, $30 co-pay, no visit limit    PT Start Time  0930    PT Stop Time  1015    PT Time Calculation (min)  45 min    Equipment Utilized During Treatment  Gait belt    Activity Tolerance  Patient tolerated treatment well    Behavior During Therapy  WFL for tasks assessed/performed       Past Medical History:  Diagnosis Date  . Anemia   . Arthritis   . Asthma   . Colon polyps    adenomatous  . Diabetes mellitus without complication ([place])   . Diabetic infection of left foot ([place]) 12/2018  . Diverticulosis of colon   . Esophagitis   . GERD (gastroesophageal reflux disease)   . Hemorrhoid    internal  . Hyperlipemia   . Hypertension   . IBS (irritable bowel syndrome)    no current prob - diet controlled  . Myalgia due to statin 11/19/2017  . Neuropathy   . Neuropathy of both feet   . Neuropathy of hand   . Pneumonia    x 4  . PONV (postoperative nausea and vomiting)    on some surgeries but not all procedures  . Skin ulcer of right ankle, limited to breakdown of skin ([place]) 12/31/2016   resolved per patient 05/14/19  . Sleep apnea    has had in the past lost 50 pounds and do longer uses cpap  . Statin intolerance 04/23/2015  . Stroke [facility]) 2009   mini stroke per patient  . Stroke-like episode 2009   TIA - mini stroke per patient  . Uncontrolled type 2  diabetes mellitus with gastroparesis ([place]) 07/13/2015  . Uncontrolled type 2 diabetes mellitus with hyperglycemia ([place]) 11/01/2017    Past Surgical History:  Procedure Laterality Date  . ABDOMINAL HYSTERECTOMY    . AMPUTATION Left 12/06/2018   Procedure: PARTIAL AMPUTATION LEFT FOOT;  Surgeon: Edrick Kins, DPM;  Location: [place];  Service: Podiatry;  Laterality: Left;  . AMPUTATION Left 05/16/2019   Procedure: LEFT BELOW KNEE AMPUTATION;  Surgeon: Newt Minion, MD;  Location: [place];  Service: Orthopedics;  Laterality: Left;  . BACK SURGERY  09/02/2015  . BONE BIOPSY Left 12/06/2018   Procedure: Bone Biopsy;  Surgeon: Edrick Kins, DPM;  Location: [place];  Service: Podiatry;  Laterality: Left;  . CERVICAL FUSION    . CESAREAN SECTION  1986  . COLONOSCOPY N/A 11/30/2012   Procedure: COLONOSCOPY;  Surgeon: Irene Shipper, MD;  Location: [facility] ENDOSCOPY;  Service: Endoscopy;  Laterality: N/A;  . COLONOSCOPY WITH PROPOFOL N/A 01/03/2016   Procedure: COLONOSCOPY WITH PROPOFOL;  Surgeon: Manya Silvas, MD;  Location: [facility] ENDOSCOPY;  Service: Endoscopy;  Laterality: N/A;  . ESOPHAGOGASTRODUODENOSCOPY (EGD) WITH PROPOFOL N/A 01/14/2015   Procedure: ESOPHAGOGASTRODUODENOSCOPY (EGD) WITH  PROPOFOL;  Surgeon: Manya Silvas, MD;  Location: [facility] ENDOSCOPY;  Service: Endoscopy;  Laterality: N/A;  . IRRIGATION AND DEBRIDEMENT FOOT Left 03/13/2018   Procedure: IRRIGATION AND DEBRIDEMENT FOOT WITH BONE BIOPSY WITH MISONIX DEBRIDER;  Surgeon: Evelina Bucy, DPM;  Location: [place];  Service: Podiatry;  Laterality: Left;  . IRRIGATION AND DEBRIDEMENT FOOT Left 12/06/2018   Procedure: Irrigation And Debridement Foot;  Surgeon: Edrick Kins, DPM;  Location: [place];  Service: Podiatry;  Laterality: Left;  . LEG AMPUTATION BELOW KNEE Left   . LUMBAR WOUND DEBRIDEMENT N/A 10/01/2015   Procedure: LUMBAR WOUND DEBRIDEMENT;  Surgeon: Ashok Pall, MD;  Location: [place] [facility]URO ORS;  Service: Neurosurgery;  Laterality: N/A;   LUMBAR WOUND DEBRIDEMENT  . NASAL SINUS SURGERY    . SAVORY DILATION N/A 01/14/2015   Procedure: SAVORY DILATION;  Surgeon: Manya Silvas, MD;  Location: [facility] ENDOSCOPY;  Service: Endoscopy;  Laterality: N/A;  . TONSILLECTOMY    . WISDOM TOOTH EXTRACTION      There were no vitals filed for this visit.  Subjective Assessment - 11/06/19 0931    Subjective  She has some soreness in arms from exercises that she learned last PT session.  She is wearing prosthesis most of awake hours drying every 4 hrs    Pertinent History  L TTA, DM, CVA, sleep apnea, PNA, neuropathy bil. 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  No/denies    Pain Onset  Other (comment)   has history but flare up in last couple weeks                       [facility] Adult PT Treatment/Exercise - 11/06/19 0930      Transfers   Transfers  Sit to Stand;Stand to Sit;Stand Pivot Transfers    Sit to Stand  5: Supervision;With upper extremity assist;With armrests;From chair/3-in-1   to forearm crutches   Stand to Sit  5: Supervision;With upper extremity assist;With armrests;To chair/3-in-1   from forearm crutches   Stand Pivot Transfers  5: Supervision   turning 180* sit /stand rollator seat   Stand Pivot Transfer Details (indicate cue type and reason)  verbal cues on rollator control for safety      Ambulation/Gait   Ambulation/Gait  Yes    Ambulation/Gait Assistance  5: Supervision    Ambulation/Gait Assistance Details  verbal cues on position within rollator for upright posture.  PT adjusted height of her new rollator     Ambulation Distance (Feet)  150 Feet    Assistive device  Prosthesis;Rollator    Gait Pattern  Step-to pattern;Decreased step  length - right;Decreased stance time - left;Decreased stride length;Decreased weight shift to left    Ambulation Surface  Level;Indoor    Ramp  5: Supervision   rollator walker & TTA prosthesis   Ramp Details (indicate cue type and reason)  verbal cues on rollator control & wt shift on prosthesis    Curb  5: Supervision   rollator walker & TTA prosthesis   Curb Details (indicate cue type and reason)  verbal cues on sequence with rollator      Knee/Hip Exercises: Aerobic   Nustep  level 5 with BUEs &  BLEs 5 minutes PT cues on technique & full motion      Prosthetics   Prosthetic Care Comments   PT instructed with written handout on sweat management including signs of sweat in liner and adjusting ply socks     Current prosthetic wear tolerance (days/week)   daily    Current prosthetic wear tolerance (#hours/day)   liner most of awake hours. prosthesis awake hours except removes 30-40 minutes ~4 x/day    Residual limb condition   No open areas,  frail looking skin, pale color, colder temperature, no hair growth (shaving PT advised against) clinderical shape    Education Provided  Skin check;Residual limb care;Other (comment);Proper Donning;Proper wear schedule/adjustment;Correct ply sock adjustment   see prosthetic care comments   Person(s) Educated  Patient    Education Method  Explanation;Demonstration;Tactile cues;Verbal cues;Handout    Education Method  Verbalized understanding;Returned demonstration;Tactile cues required;Verbal cues required;Needs further instruction    Donning Prosthesis  Supervision    Doffing Prosthesis  Supervision               PT Short Term Goals - 11/06/19 1115      PT SHORT TERM GOAL #1   Title  Patient verbalizes understanding of adjusting ply socks with prosthesis wear. (All STGs Target Date: 11/28/2019)    Time  1    Period  Months    Status  On-going    Target Date  11/28/19      PT SHORT TERM GOAL #2   Title  Patient tolerates prosthesis  wear >12 hrs total/ day without skin or limb pain issues.    Time  1    Period  Months    Status  On-going    Target Date  11/28/19      PT SHORT TERM GOAL #3   Title  Patient standing balance static with eyes open 20 seconds within 3 attempts and turns head right/left/up/down without UE support with supervision.    Time  1    Period  Months    Status  On-going    Target Date  11/28/19      PT SHORT TERM GOAL #4   Title  Patient ambulates 200' with forearm crutches & prosthesis with supervision.    Time  1    Period  Months    Status  On-going    Target Date  11/28/19      PT SHORT TERM GOAL #5   Title  patient negotiates ramps & curbs with forearm crutches & prosthesis with supervision.    Time  1    Period  Months    Status  On-going    Target Date  11/28/19        PT Long Term Goals - 10/21/19 2211      PT LONG TERM GOAL #1   Title  Patient verbalizes & demonstrates proper prosthetic care to enable safe utilization of prosthesis.  (All LTGs Target Date 12/19/2019)    Time  3    Period  Months    Status  On-going    Target Date  12/19/19      PT LONG TERM GOAL #2   Title  Patient tolerates prosthesis wear & right ankle orthosis wear >90% of awake hours to enable function throughout her day.    Time  3    Period  Months    Status  On-going    Target Date  12/19/19      PT LONG TERM GOAL #  3   Title  Standing balance: static without UE support eyes open for 30 seconds and dynamic with UE support reaching 10", pick up object from floor, managing clothes & static eyes closed 2 minutes safely modified independent.    Time  3    Period  Months    Status  On-going    Target Date  12/19/19      PT LONG TERM GOAL #4   Title  Patient ambulates 300' including grass with cane or less and prosthesis modified independent for community access.    Time  3    Period  Months    Status  On-going    Target Date  12/19/19      PT LONG TERM GOAL #5   Title  Patient negotiates  ramps & curbs with cane or less & prosthesis modified independent for community access.    Time  3    Period  Months    Status  On-going    Target Date  12/19/19            Plan - 11/06/19 0940    Clinical Impression Statement  PT educated patient on sweat management & adjusting ply socks which she appears to have better understanding.  Patient was challenged by Nustep exercise due to impaired activity tolerance.  Patient purchased a rollator walker & appears to be using safely for basic community activities.    Personal Factors and Comorbidities  Comorbidity 3+;Fitness;Time since onset of injury/illness/exacerbation    Comorbidities  L TTA, DM, CVA, sleep apnea, PNA, neuropathy bil. feet and hand, HTN, s/p cervical fusion, s/p back surgery, IBS, asthma, Covid in Jan 2021    Examination-Activity Limitations  Hygiene/Grooming;Locomotion Level;Reach Overhead;Stairs;Stand;Transfers    Examination-Participation Restrictions  Recruitment consultant  Evolving/Moderate complexity    Rehab Potential  Good    PT Frequency  2x / week    PT Duration  12 weeks    PT Treatment/Interventions  ADLs/Self Care Home Management;DME Instruction;Gait training;Stair training;Functional mobility training;Therapeutic activities;Therapeutic exercise;Balance training;Neuromuscular re-education;Patient/family education;Orthotic Fit/Training;Prosthetic Training;Vestibular    PT Next Visit Plan  work towards updated STGs, prosthetic gait with forearm crutches, therapuetic exercise for endurance & strength, standing balance with decreased UE support & facilitating upright posture    PT Home Exercise Plan  at sink for mini squats with UE support, weight shifts with UE support, and balance feet apart no Ue support all with chair behind her    Consulted and Agree with Plan of Care  Patient       Patient will benefit from skilled therapeutic intervention in order to improve  the following deficits and impairments:  Abnormal gait, Decreased activity tolerance, Decreased balance, Decreased endurance, Decreased knowledge of use of DME, Decreased mobility, Decreased range of motion, Decreased safety awareness, Decreased strength, Increased edema, Impaired flexibility, Impaired sensation, Postural dysfunction, Prosthetic Dependency, Obesity, Pain  Visit Diagnosis: Other abnormalities of gait and mobility  Unsteadiness on feet  Abnormal posture  Weakness generalized  History of fall  Stiffness of left knee, not elsewhere classified     Problem List Patient Active Problem List   Diagnosis Date Noted  . History of below knee amputation, left ([place]) 07/30/2019  . Gangrene of left foot ([place]) 05/16/2019  . Subacute osteomyelitis of left foot ([place])   . Charcot's joint of foot, left   . Post-operative state   . PAD (peripheral artery disease) ([place]) 12/05/2018  . Osteomyelitis of ankle or foot, acute,  left ([place]) 12/05/2018  . MRSA bacteremia 12/05/2018  . Sepsis without acute organ dysfunction ([place])   . Charcot's joint of foot due to diabetes ([place]) 09/18/2018  . BMI 36.0-36.9,adult   . Morbid obesity ([place]) 11/19/2017  . B12 deficiency 11/01/2017  . Diabetic foot ulcer ([place]) 12/14/2016  . Diabetic polyneuropathy associated with type 2 diabetes mellitus ([place]) 12/13/2015  . Lumbar stenosis with neurogenic claudication 09/02/2015  . Chronic constipation 07/13/2015  . OSA (obstructive sleep apnea) 06/25/2015  . 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/23/2015  . Hypertriglyceridemia 04/23/2015  . Asthma, mild intermittent 02/01/2015  . Type 2 diabetes mellitus with diabetic nephropathy ([place]) 12/31/2014  . Gastroesophageal reflux disease with esophagitis 12/31/2014  . Dysphagia 12/31/2014  . Bilateral carpal tunnel syndrome 12/01/2014  . Diverticulosis of colon (without mention of hemorrhage) 11/30/2012  . Essential hypertension, benign 11/28/2012     Jamey Reas PT, DPT 11/06/2019, 11:20 AM  [facility] [address] [place], [place], [address] Phone: [phone]   Fax:  [phone]  Name: Olivia Romero MRN: [id number] Date of Birth: Oct 21, 1955

## 2019-11-06 NOTE — Patient Instructions (Signed)
Sweating increases with an amputation. Your body is trying to regulate your temperature & without an extremity, you sweat more easily to cool off. Also prosthetic material like liners do not breath and add hot layers which causes even more sweating. With time your body typically will accommodate to prosthesis and your sweat level will come closer to level with amputation but not pre-amputation level.   You need to pat your limb & liner dry when you notice sweating. If you leave sweat trapped inside your liner, then it can result in a blister.   Signs of sweating in your liner: 1. You are sweating elsewhere on your body or you notice sweat running / dripping.  2. Take note of how high your liner comes up on your limb when you first put your liner on your limb. If you notice that your liner has slipped down, then you probably have sweat inside your liner. A good time to check for liner slippage is when toileting.  3. You feel air bubbles inside your liner. When you liner slips, then air is allowed in bottom. As you put weight on prosthesis, the air is burp or pushed out. 4. You feel something crawling or moving inside your liner. When sweat runs inside the closed system of liner, it often feels like a bug or something crawling inside your liner.  If any of above symptoms are noted, you need to remove your prosthesis & liner to pat your limb & liner dry. This is permanent need as leaving sweat or water trapped can result in a blister or wound.    [employer] Socks:  1-ply is thin no color at top,  3-ply is yellow at top,  5-ply is green at top [employer]: 1-ply is yellow color at top, 3-ply is green at top, 5-ply is navy blue at top How many ply you need depends on your limb size.  You should have even pressure on your limb when standing & walking.  Guidance points: 1. How ease it goes on? Should be some resistance. Too few it goes on too easily. Too many it takes a lot of work to get it on. 2. How many  clicks you get. Especially clicks in sitting. 3. After standing or walking, check knee cap. Bottom should be just under the front lip.  Too few bottom of knee cap sits on indention. Too many bottom is above front lip. 4. Have your feet beside each other & hips over feet. Place hands on your waist. Pelvis Should be level. Too few prosthetic side will be low. Too many prosthetic side will be high.    Get ply socks correct before you leave the house. Take extra socks with you. Take one 3-ply and two 1-ply with you. This is in addition to what you are wearing.

## 2019-11-11 ENCOUNTER — Other Ambulatory Visit: Payer: Self-pay

## 2019-11-11 ENCOUNTER — Ambulatory Visit (INDEPENDENT_AMBULATORY_CARE_PROVIDER_SITE_OTHER): Payer: Medicare Other | Admitting: Physical Therapy

## 2019-11-11 ENCOUNTER — Encounter: Payer: Self-pay | Admitting: Physical Therapy

## 2019-11-11 DIAGNOSIS — M25662 Stiffness of left knee, not elsewhere classified: Secondary | ICD-10-CM

## 2019-11-11 DIAGNOSIS — R2689 Other abnormalities of gait and mobility: Secondary | ICD-10-CM

## 2019-11-11 DIAGNOSIS — Z9181 History of falling: Secondary | ICD-10-CM

## 2019-11-11 DIAGNOSIS — R531 Weakness: Secondary | ICD-10-CM | POA: Diagnosis not present

## 2019-11-11 DIAGNOSIS — R293 Abnormal posture: Secondary | ICD-10-CM

## 2019-11-11 DIAGNOSIS — R2681 Unsteadiness on feet: Secondary | ICD-10-CM

## 2019-11-12 NOTE — Therapy (Signed)
[facility] [address] [place], [place], [address] Phone: [phone]   Fax:  [phone]  Physical Therapy Treatment  Patient Details  Name: Olivia Romero MRN: [id number] Date of Birth: [birth year] Referring Provider (PT): Meridee Score, MD   Encounter Date: 11/11/2019  PT End of Session - 11/11/19 1056    Visit Number  11    Number of Visits  25    Date for PT Re-Evaluation  12/21/19    Authorization Type  [employer] Medicare, $30 co-pay, no visit limit    Progress Note Due on Visit  20    PT Start Time  1059    PT Stop Time  1144    PT Time Calculation (min)  45 min    Equipment Utilized During Treatment  Gait belt    Activity Tolerance  Patient tolerated treatment well    Behavior During Therapy  [facility] for tasks assessed/performed       Past Medical History:  Diagnosis Date  . Anemia   . Arthritis   . Asthma   . Colon polyps    adenomatous  . Diabetes mellitus without complication ([place])   . Diabetic infection of left foot ([place]) 12/2018  . Diverticulosis of colon   . Esophagitis   . GERD (gastroesophageal reflux disease)   . Hemorrhoid    internal  . Hyperlipemia   . Hypertension   . IBS (irritable bowel syndrome)    no current prob - diet controlled  . Myalgia due to statin 11/19/2017  . Neuropathy   . Neuropathy of both feet   . Neuropathy of hand   . Pneumonia    x 4  . PONV (postoperative nausea and vomiting)    on some surgeries but not all procedures  . Skin ulcer of right ankle, limited to breakdown of skin ([place]) 12/31/2016   resolved per patient 05/14/19  . Sleep apnea    has had in the past lost 50 pounds and do longer uses cpap  . Statin intolerance 04/23/2015  . Stroke [name]) 2009   mini stroke per patient  . Stroke-like episode 2009   TIA - mini stroke per patient  . 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 ([place]) 07/13/2015  . Uncontrolled type 2 diabetes mellitus with hyperglycemia ([place]) 11/01/2017     Past Surgical History:  Procedure Laterality Date  . ABDOMINAL HYSTERECTOMY    . AMPUTATION Left 12/06/2018   Procedure: PARTIAL AMPUTATION LEFT FOOT;  Surgeon: Edrick Kins, DPM;  Location: [place];  Service: Podiatry;  Laterality: Left;  . AMPUTATION Left 05/16/2019   Procedure: LEFT BELOW KNEE AMPUTATION;  Surgeon: Newt Minion, MD;  Location: [place];  Service: Orthopedics;  Laterality: Left;  . BACK SURGERY  09/02/2015  . BONE BIOPSY Left 12/06/2018   Procedure: Bone Biopsy;  Surgeon: Edrick Kins, DPM;  Location: [place];  Service: Podiatry;  Laterality: Left;  . CERVICAL FUSION    . CESAREAN SECTION  1986  . COLONOSCOPY N/A 11/30/2012   Procedure: COLONOSCOPY;  Surgeon: Irene Shipper, MD;  Location: [facility] ENDOSCOPY;  Service: Endoscopy;  Laterality: N/A;  . COLONOSCOPY WITH PROPOFOL N/A 01/03/2016   Procedure: COLONOSCOPY WITH PROPOFOL;  Surgeon: Manya Silvas, MD;  Location: [facility] ENDOSCOPY;  Service: Endoscopy;  Laterality: N/A;  . ESOPHAGOGASTRODUODENOSCOPY (EGD) WITH PROPOFOL N/A 01/14/2015   Procedure: ESOPHAGOGASTRODUODENOSCOPY (EGD) WITH PROPOFOL;  Surgeon: Manya Silvas, MD;  Location: [facility] ENDOSCOPY;  Service: Endoscopy;  Laterality: N/A;  .  IRRIGATION AND DEBRIDEMENT FOOT Left 03/13/2018   Procedure: IRRIGATION AND DEBRIDEMENT FOOT WITH BONE BIOPSY WITH MISONIX DEBRIDER;  Surgeon: Evelina Bucy, DPM;  Location: [place];  Service: Podiatry;  Laterality: Left;  . IRRIGATION AND DEBRIDEMENT FOOT Left 12/06/2018   Procedure: Irrigation And Debridement Foot;  Surgeon: Edrick Kins, DPM;  Location: [place];  Service: Podiatry;  Laterality: Left;  . LEG AMPUTATION BELOW KNEE Left   . LUMBAR WOUND DEBRIDEMENT N/A 10/01/2015   Procedure: LUMBAR WOUND DEBRIDEMENT;  Surgeon: Ashok Pall, MD;  Location: [place] NEURO ORS;  Service: Neurosurgery;  Laterality: N/A;  LUMBAR WOUND DEBRIDEMENT  . NASAL SINUS SURGERY    . SAVORY DILATION N/A 01/14/2015   Procedure: SAVORY DILATION;  Surgeon: Manya Silvas, MD;  Location[facility] ENDOSCOPY;  Service: Endoscopy;  Laterality: N/A;  . TONSILLECTOMY    . WISDOM TOOTH EXTRACTION      There were no vitals filed for this visit.  Subjective Assessment - 11/11/19 1056    Subjective  She is wearing prosthesis from arising to bedtime. She has been using antiperspirant which has helped. She has been managing sweat. She was excited that she was able to sit outside under carport.    Pertinent History  L TTA, DM, CVA, sleep apnea, PNA, neuropathy bil. 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  No/denies    Pain Onset  --                        [facility] Adult PT Treatment/Exercise - 11/11/19 1100      Transfers   Transfers  Sit to Stand;Stand to [employer]    Sit to Stand  5: Supervision;With upper extremity assist;With armrests;From chair/3-in-1   to rollator walker   Sit to Stand Details  Visual cues for safe use of DME/AE;Verbal cues for precautions/safety    Sit to Stand Details (indicate cue type and reason)  cues on locking rollator walker prior to standing for safety    Stand to Sit  5: Supervision;With upper extremity assist;With armrests;To chair/3-in-1   from rollator walker   Stand to Sit Details (indicate cue type and reason)  Visual cues for safe use of DME/AE;Verbal cues for precautions/safety    Stand to Sit Details  cues on locking rollator walker prior to sitting for safety    Stand Pivot Transfers  --      Ambulation/Gait   Ambulation/Gait  Yes    Ambulation/Gait Assistance  5: Supervision    Ambulation/Gait Assistance Details  manual / tactile / HHA and verbal cues on technique with cane stand alone tip    Ambulation Distance (Feet)  150  Feet   150' enter/exit rollator, 53' & 20' cane/HHA   Assistive device  Prosthesis;Rollator;Straight cane;1 person hand held assist   cane stand alone tip & HHA   Gait Pattern  --    Ambulation Surface  Level;Indoor    Ramp  --    Curb  --      Neuro Re-ed    Neuro Re-ed Details   standing with intermittent UE support & PT minA for stabilization: alternating UEs & BUEs 5 reps ea rows, forward punch & biceps  curls.       Knee/Hip Exercises: Aerobic   Nustep  level 5 with BUEs & BLEs 8 minutes PT cues on technique & full motion      Knee/Hip Exercises: Machines for Strengthening   Cybex Knee Extension  10# BLEs 10 reps   verbal cues on technique   Cybex Knee Flexion  seated hamstring curls 20# BLEs 10 reps   verbal cues on technique     Knee/Hip Exercises: Standing   Hip Flexion  Stengthening;Right;Left;5 reps;Knee straight   BUE support level 2 theraband   Hip Flexion Limitations  demo, verbal & tactile cues on upright posture & controlled motion.  Facilitating BLE weight bearing & core stabilization.     Hip ADduction  Strengthening;Right;Left;5 reps   BUE support level 2 theraband   Hip ADduction Limitations  demo, verbal & tactile cues on upright posture & controlled motion.  Facilitating BLE weight bearing & core stabilization.     Hip Abduction  Stengthening;Right;Left;5 reps;Knee straight   BUE support level 2 theraband   Abduction Limitations  demo, verbal & tactile cues on upright posture & controlled motion.  Facilitating BLE weight bearing & core stabilization.     Hip Extension  Stengthening;Right;Left;5 reps;Knee straight   BUE support level 2 theraband   Extension Limitations  demo, verbal & tactile cues on upright posture & controlled motion.  Facilitating BLE weight bearing & core stabilization.       Prosthetics   Prosthetic Care Comments   Donning compression sock on RLE - recommendation to discuss compression socks with Dr. Sharol Given at appt next week.     Current  prosthetic wear tolerance (days/week)   daily    Current prosthetic wear tolerance (#hours/day)   prosthesis most of awake hours drying 4x/day    Residual limb condition   No open areas,  frail looking skin, pale color, colder temperature, no hair growth (shaving PT advised against) clinderical shape    Education Provided  Skin check;Residual limb care;Other (comment);Proper Donning;Proper wear schedule/adjustment;Correct ply sock adjustment   see prosthetic care comments   Person(s) Educated  Patient    Education Method  Explanation;Verbal cues;Demonstration;Tactile cues    Education Method  Verbalized understanding;Returned demonstration;Verbal cues required;Needs further instruction    Donning Prosthesis  Supervision    Doffing Prosthesis  Supervision               PT Short Term Goals - 11/06/19 1115      PT SHORT TERM GOAL #1   Title  Patient verbalizes understanding of adjusting ply socks with prosthesis wear. (All STGs Target Date: 11/28/2019)    Time  1    Period  Months    Status  On-going    Target Date  11/28/19      PT SHORT TERM GOAL #2   Title  Patient tolerates prosthesis wear >12 hrs total/ day without skin or limb pain issues.    Time  1    Period  Months    Status  On-going    Target Date  11/28/19      PT SHORT TERM GOAL #3   Title  Patient standing balance static with eyes open 20 seconds within 3 attempts and turns head right/left/up/down without UE support with supervision.    Time  1    Period  Months    Status  On-going    Target Date  11/28/19      PT SHORT TERM GOAL #4   Title  Patient ambulates 200' with forearm crutches & prosthesis with supervision.    Time  1    Period  Months    Status  On-going    Target Date  11/28/19      PT SHORT TERM GOAL #5   Title  patient negotiates ramps & curbs with forearm crutches & prosthesis with supervision.    Time  1    Period  Months    Status  On-going    Target Date  11/28/19        PT Long  Term Goals - 10/21/19 2211      PT LONG TERM GOAL #1   Title  Patient verbalizes & demonstrates proper prosthetic care to enable safe utilization of prosthesis.  (All LTGs Target Date 12/19/2019)    Time  3    Period  Months    Status  On-going    Target Date  12/19/19      PT LONG TERM GOAL #2   Title  Patient tolerates prosthesis wear & right ankle orthosis wear >90% of awake hours to enable function throughout her day.    Time  3    Period  Months    Status  On-going    Target Date  12/19/19      PT LONG TERM GOAL #3   Title  Standing balance: static without UE support eyes open for 30 seconds and dynamic with UE support reaching 10", pick up object from floor, managing clothes & static eyes closed 2 minutes safely modified independent.    Time  3    Period  Months    Status  On-going    Target Date  12/19/19      PT LONG TERM GOAL #4   Title  Patient ambulates 300' including grass with cane or less and prosthesis modified independent for community access.    Time  3    Period  Months    Status  On-going    Target Date  12/19/19      PT LONG TERM GOAL #5   Title  Patient negotiates ramps & curbs with cane or less & prosthesis modified independent for community access.    Time  3    Period  Months    Status  On-going    Target Date  12/19/19            Plan - 11/11/19 1056    Clinical Impression Statement  PT introduced weight machines for knees and standing LE kicks with BUE support to facilitate core stabilization, balance & strengthening.  PT began prosthetic training with cane & hand hold assist with no pain but will need additional skilled work before attempting outside of PT.    Personal Factors and Comorbidities  Comorbidity 3+;Fitness;Time since onset of injury/illness/exacerbation    Comorbidities  L TTA, DM, CVA, sleep apnea, PNA, neuropathy bil. feet and hand, HTN, s/p cervical fusion, s/p back surgery, IBS, asthma, Covid in Jan 2021    Examination-Activity  Limitations  Hygiene/Grooming;Locomotion Level;Reach Overhead;Stairs;Stand;Transfers    Examination-Participation Restrictions  Recruitment consultant  Evolving/Moderate complexity    Rehab Potential  Good    PT Frequency  2x / week    PT Duration  12 weeks    PT Treatment/Interventions  ADLs/Self Care Home Management;DME Instruction;Gait training;Stair training;Functional mobility training;Therapeutic activities;Therapeutic exercise;Balance training;Neuromuscular re-education;Patient/family education;Orthotic Fit/Training;Prosthetic Training;Vestibular    PT Next Visit Plan  work towards updated STGs, prosthetic gait with forearm  crutches community activities & cane household activities, therapuetic exercise for endurance & strength, standing balance with decreased UE support & facilitating upright posture    PT Home Exercise Plan  at sink for mini squats with UE support, weight shifts with UE support, and balance feet apart no Ue support all with chair behind her    Consulted and Agree with Plan of Care  Patient       Patient will benefit from skilled therapeutic intervention in order to improve the following deficits and impairments:  Abnormal gait, Decreased activity tolerance, Decreased balance, Decreased endurance, Decreased knowledge of use of DME, Decreased mobility, Decreased range of motion, Decreased safety awareness, Decreased strength, Increased edema, Impaired flexibility, Impaired sensation, Postural dysfunction, Prosthetic Dependency, Obesity, Pain  Visit Diagnosis: Unsteadiness on feet  Other abnormalities of gait and mobility  Abnormal posture  Weakness generalized  History of fall  Stiffness of left knee, not elsewhere classified     Problem List Patient Active Problem List   Diagnosis Date Noted  . History of below knee amputation, left ([place]) 07/30/2019  . Gangrene of left foot ([place]) 05/16/2019  . Subacute  osteomyelitis of left foot ([place])   . Charcot's joint of foot, left   . Post-operative state   . PAD (peripheral artery disease) ([place]) 12/05/2018  . Osteomyelitis of ankle or foot, acute, left ([place]) 12/05/2018  . MRSA bacteremia 12/05/2018  . Sepsis without acute organ dysfunction ([place])   . Charcot's joint of foot due to diabetes ([place]) 09/18/2018  . BMI 36.0-36.9,adult   . Morbid obesity ([place]) 11/19/2017  . B12 deficiency 11/01/2017  . Diabetic foot ulcer ([place]) 12/14/2016  . Diabetic polyneuropathy associated with type 2 diabetes mellitus ([place]) 12/13/2015  . Lumbar stenosis with neurogenic claudication 09/02/2015  . Chronic constipation 07/13/2015  . OSA (obstructive sleep apnea) 06/25/2015  . 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/23/2015  . Hypertriglyceridemia 04/23/2015  . Asthma, mild intermittent 02/01/2015  . Type 2 diabetes mellitus with diabetic nephropathy ([place]) 12/31/2014  . Gastroesophageal reflux disease with esophagitis 12/31/2014  . Dysphagia 12/31/2014  . Bilateral carpal tunnel syndrome 12/01/2014  . Diverticulosis of colon (without mention of hemorrhage) 11/30/2012  . Essential hypertension, benign 11/28/2012    Jamey Reas PT, DPT 11/12/2019, 7:38 AM  [facility] [address] [place], [place], [address] Phone: [phone]   Fax:  [phone]  Name: Olivia Romero MRN: [id number] Date of Birth: 1955-08-15

## 2019-11-13 ENCOUNTER — Ambulatory Visit (INDEPENDENT_AMBULATORY_CARE_PROVIDER_SITE_OTHER): Payer: Medicare Other | Admitting: Physical Therapy

## 2019-11-13 ENCOUNTER — Encounter: Payer: Self-pay | Admitting: Physical Therapy

## 2019-11-13 ENCOUNTER — Other Ambulatory Visit: Payer: Self-pay

## 2019-11-13 DIAGNOSIS — R531 Weakness: Secondary | ICD-10-CM

## 2019-11-13 DIAGNOSIS — R2689 Other abnormalities of gait and mobility: Secondary | ICD-10-CM | POA: Diagnosis not present

## 2019-11-13 DIAGNOSIS — R2681 Unsteadiness on feet: Secondary | ICD-10-CM | POA: Diagnosis not present

## 2019-11-13 DIAGNOSIS — R293 Abnormal posture: Secondary | ICD-10-CM

## 2019-11-13 DIAGNOSIS — M25662 Stiffness of left knee, not elsewhere classified: Secondary | ICD-10-CM

## 2019-11-13 NOTE — Therapy (Signed)
[facility] [address] [place], [place], [address] Phone: [phone]   Fax:  [phone]  Physical Therapy Treatment  Patient Details  Name: Olivia Romero MRN: [id number] Date of Birth: 08-May-1956 Referring Provider (PT): Meridee Score, MD   Encounter Date: 11/13/2019   PT End of Session - 11/13/19 0910    Visit Number 12    Number of Visits 25    Date for PT Re-Evaluation 12/21/19    Authorization Type [employer] Medicare, $30 co-pay, no visit limit    Progress Note Due on Visit 20    PT Start Time 0911    PT Stop Time 1005    PT Time Calculation (min) 54 min    Equipment Utilized During Treatment Gait belt    Activity Tolerance Patient tolerated treatment well    Behavior During Therapy [facility] for tasks assessed/performed           Past Medical History:  Diagnosis Date  . Anemia   . Arthritis   . Asthma   . Colon polyps    adenomatous  . Diabetes mellitus without complication ([place])   . Diabetic infection of left foot ([place]) 12/2018  . Diverticulosis of colon   . Esophagitis   . GERD (gastroesophageal reflux disease)   . Hemorrhoid    internal  . Hyperlipemia   . Hypertension   . IBS (irritable bowel syndrome)    no current prob - diet controlled  . Myalgia due to statin 11/19/2017  . Neuropathy   . Neuropathy of both feet   . Neuropathy of hand   . Pneumonia    x 4  . PONV (postoperative nausea and vomiting)    on some surgeries but not all procedures  . Skin ulcer of right ankle, limited to breakdown of skin ([place]) 12/31/2016   resolved per patient 05/14/19  . Sleep apnea    has had in the past lost 50 pounds and do longer uses cpap  . Statin intolerance 04/23/2015  . Stroke [facility]) 2009   mini stroke per patient  . Stroke-like episode 2009   TIA - mini stroke per patient  . 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 ([place]) 07/13/2015  . Uncontrolled type 2 diabetes mellitus with hyperglycemia ([place]) 11/01/2017     Past Surgical History:  Procedure Laterality Date  . ABDOMINAL HYSTERECTOMY    . AMPUTATION Left 12/06/2018   Procedure: PARTIAL AMPUTATION LEFT FOOT;  Surgeon: Edrick Kins, DPM;  Location: [place];  Service: Podiatry;  Laterality: Left;  . AMPUTATION Left 05/16/2019   Procedure: LEFT BELOW KNEE AMPUTATION;  Surgeon: Newt Minion, MD;  Location: [place];  Service: Orthopedics;  Laterality: Left;  . BACK SURGERY  09/02/2015  . BONE BIOPSY Left 12/06/2018   Procedure: Bone Biopsy;  Surgeon: Edrick Kins, DPM;  Location: [place];  Service: Podiatry;  Laterality: Left;  . CERVICAL FUSION    . CESAREAN SECTION  1986  . COLONOSCOPY N/A 11/30/2012   Procedure: COLONOSCOPY;  Surgeon: Irene Shipper, MD;  Location: [facility] ENDOSCOPY;  Service: Endoscopy;  Laterality: N/A;  . COLONOSCOPY WITH PROPOFOL N/A 01/03/2016   Procedure: COLONOSCOPY WITH PROPOFOL;  Surgeon: Manya Silvas, MD;  Location: [facility] ENDOSCOPY;  Service: Endoscopy;  Laterality: N/A;  . ESOPHAGOGASTRODUODENOSCOPY (EGD) WITH PROPOFOL N/A 01/14/2015   Procedure: ESOPHAGOGASTRODUODENOSCOPY (EGD) WITH PROPOFOL;  Surgeon: Manya Silvas, MD;  Location: [facility] ENDOSCOPY;  Service: Endoscopy;  Laterality: N/A;  . IRRIGATION AND DEBRIDEMENT FOOT Left 03/13/2018  Procedure: IRRIGATION AND DEBRIDEMENT FOOT WITH BONE BIOPSY WITH MISONIX DEBRIDER;  Surgeon: Evelina Bucy, DPM;  Location: [place];  Service: Podiatry;  Laterality: Left;  . IRRIGATION AND DEBRIDEMENT FOOT Left 12/06/2018   Procedure: Irrigation And Debridement Foot;  Surgeon: Edrick Kins, DPM;  Location: [place];  Service: Podiatry;  Laterality: Left;  . LEG AMPUTATION BELOW KNEE Left   . LUMBAR WOUND DEBRIDEMENT N/A 10/01/2015   Procedure: LUMBAR WOUND DEBRIDEMENT;  Surgeon: Ashok Pall, MD;  Location: [place] NEURO ORS;  Service: Neurosurgery;  Laterality: N/A;  LUMBAR WOUND DEBRIDEMENT  . NASAL SINUS SURGERY    . SAVORY DILATION N/A 01/14/2015   Procedure: SAVORY DILATION;  Surgeon: Manya Silvas, MD;  Location: [facility] ENDOSCOPY;  Service: Endoscopy;  Laterality: N/A;  . TONSILLECTOMY    . WISDOM TOOTH EXTRACTION      There were no vitals filed for this visit.   Subjective Assessment - 11/13/19 0911    Subjective She had significant muscle pain from PT exercises last session but better.    Pertinent History L TTA, DM, CVA, sleep apnea, PNA, neuropathy bil. 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 Yes    Pain Score 5     Pain Location Leg    Pain Orientation Left;Posterior;Anterior    Pain Descriptors / Indicators Aching    Pain Type Acute pain    Pain Onset In the past 7 days    Pain Frequency Intermittent    Aggravating Factors  s/p new exercises muscle soreness    Pain Relieving Factors moving & medications                             [facility] Adult PT Treatment/Exercise - 11/13/19 0915      Transfers   Transfers Sit to Stand;Stand to Sit;Stand Pivot Transfers    Sit to Stand 5: Supervision;With upper extremity assist;With armrests;From chair/3-in-1   to rollator walker   Sit to Stand Details Visual cues for safe use of DME/AE;Verbal cues for precautions/safety    Stand to Sit 5: Supervision;With upper extremity assist;With armrests;To chair/3-in-1   from rollator walker   Stand to Sit Details (indicate cue type and reason) Visual cues for safe use of DME/AE;Verbal cues for precautions/safety      Ambulation/Gait   Ambulation/Gait Yes    Ambulation/Gait Assistance 5: Supervision    Ambulation/Gait Assistance Details verbal cues on upright posture, weight shift over prosthesis in stance and sequence.     Ambulation Distance (Feet) 200 Feet   50' X 2 & 200'   Assistive device  Prosthesis;Lofstrands    Ambulation Surface Level;Indoor;Outdoor;Paved    Stairs Yes    Stairs Assistance 5: Supervision    Stairs Assistance Details (indicate cue type and reason) demo & verbal cues on technique with 2 wide rails or single rail using single forearm crutch opposite rail with instruction in technique    Stair Management Technique One rail Right;One rail Left;With crutches;Step to pattern;Forwards    Number of Stairs 7    Height of Stairs 6    Ramp 4: Min assist;5: Supervision   Min guard to supervision w/bil forearm crutches & prosthesis   Ramp  Details (indicate cue type and reason) demo & verbal cues on sequence, wt shift, upright posture and step length.     Curb 5: Supervision   bil forearm crutches & prostheses   Curb Details (indicate cue type and reason) demo & verbal cues on sequence & technique with forearm crutches, prosthesis & AFO      Neuro Re-ed    Neuro Re-ed Details  --      Knee/Hip Exercises: Stretches   Active Hamstring Stretch Right;Left;2 reps;20 seconds    Active Hamstring Stretch Limitations seated with leg extended into 2nd chair    Piriformis Stretch Right;Left;2 reps;20 seconds    Piriformis Stretch Limitations seated with leg positioned in second chair anterior    Other Knee/Hip Stretches Hip extensor stretch: seated with foot positioned at edge of chair anterior and trunk forward lean.  RLE & LLE 2 reps ea 20 sec hold      Knee/Hip Exercises: Aerobic   Nustep level 5 with BUEs & BLEs 5 minutes PT cues on technique & full motion      Knee/Hip Exercises: Machines for Strengthening   Cybex Knee Extension 10# BLEs 10 reps Batca   verbal cues on technique   Cybex Knee Flexion Batca seated hamstring curls 20# BLEs 10 reps   verbal cues on technique     Knee/Hip Exercises: Standing   Hip Flexion Stengthening;Right;Left;5 reps;Knee straight   BUE support level 2 theraband   Hip Flexion Limitations demo, verbal & tactile cues on upright posture &  controlled motion.  Facilitating BLE weight bearing & core stabilization.     Hip ADduction Strengthening;Right;Left;5 reps   BUE support level 2 theraband   Hip ADduction Limitations demo, verbal & tactile cues on upright posture & controlled motion.  Facilitating BLE weight bearing & core stabilization.     Hip Abduction Stengthening;Right;Left;5 reps;Knee straight   BUE support level 2 theraband   Abduction Limitations demo, verbal & tactile cues on upright posture & controlled motion.  Facilitating BLE weight bearing & core stabilization.     Hip Extension Stengthening;Right;Left;5 reps;Knee straight   BUE support level 2 theraband   Extension Limitations demo, verbal & tactile cues on upright posture & controlled motion.  Facilitating BLE weight bearing & core stabilization.       Prosthetics   Prosthetic Care Comments  --    Current prosthetic wear tolerance (days/week)  daily    Current prosthetic wear tolerance (#hours/day)  prosthesis most of awake hours drying 4x/day    Residual limb condition  No open areas,  frail looking skin, pale color, colder temperature, no hair growth (shaving PT advised against) clinderical shape    Education Provided Skin check;Residual limb care;Other (comment);Proper Donning;Proper wear schedule/adjustment;Correct ply sock adjustment   see prosthetic care comments   Person(s) Educated Patient    Education Method Explanation;Verbal cues    Education Method Verbalized understanding;Needs further instruction    Donning Prosthesis Supervision    Doffing Prosthesis Supervision                  PT Education - 11/13/19 1010    Education Details Seated hip stretches & standing door frame posture    Person(s) Educated Patient    Methods Explanation;Demonstration;Tactile cues;Verbal cues;Handout    Comprehension Verbalized understanding;Returned demonstration;Verbal cues required;Tactile cues required;Need further instruction            PT Short  Term Goals - 11/06/19 1115      PT SHORT TERM GOAL #  1   Title Patient verbalizes understanding of adjusting ply socks with prosthesis wear. (All STGs Target Date: 11/28/2019)    Time 1    Period Months    Status On-going    Target Date 11/28/19      PT SHORT TERM GOAL #2   Title Patient tolerates prosthesis wear >12 hrs total/ day without skin or limb pain issues.    Time 1    Period Months    Status On-going    Target Date 11/28/19      PT SHORT TERM GOAL #3   Title Patient standing balance static with eyes open 20 seconds within 3 attempts and turns head right/left/up/down without UE support with supervision.    Time 1    Period Months    Status On-going    Target Date 11/28/19      PT SHORT TERM GOAL #4   Title Patient ambulates 200' with forearm crutches & prosthesis with supervision.    Time 1    Period Months    Status On-going    Target Date 11/28/19      PT SHORT TERM GOAL #5   Title patient negotiates ramps & curbs with forearm crutches & prosthesis with supervision.    Time 1    Period Months    Status On-going    Target Date 11/28/19             PT Long Term Goals - 10/21/19 2211      PT LONG TERM GOAL #1   Title Patient verbalizes & demonstrates proper prosthetic care to enable safe utilization of prosthesis.  (All LTGs Target Date 12/19/2019)    Time 3    Period Months    Status On-going    Target Date 12/19/19      PT LONG TERM GOAL #2   Title Patient tolerates prosthesis wear & right ankle orthosis wear >90% of awake hours to enable function throughout her day.    Time 3    Period Months    Status On-going    Target Date 12/19/19      PT LONG TERM GOAL #3   Title Standing balance: static without UE support eyes open for 30 seconds and dynamic with UE support reaching 10", pick up object from floor, managing clothes & static eyes closed 2 minutes safely modified independent.    Time 3    Period Months    Status On-going    Target Date  12/19/19      PT LONG TERM GOAL #4   Title Patient ambulates 300' including grass with cane or less and prosthesis modified independent for community access.    Time 3    Period Months    Status On-going    Target Date 12/19/19      PT LONG TERM GOAL #5   Title Patient negotiates ramps & curbs with cane or less & prosthesis modified independent for community access.    Time 3    Period Months    Status On-going    Target Date 12/19/19                 Plan - 11/13/19 0910    Clinical Impression Statement PT added standing posture and seated hip stretches to HEP.  PT continued with resistive exercises including standing theraband.  PT worked on prosthetic gait with forearms which will be more functional on uneven terrain like her gravel driveway.    Personal Factors and Comorbidities Comorbidity  3+;Fitness;Time since onset of injury/illness/exacerbation    Comorbidities L TTA, DM, CVA, sleep apnea, PNA, neuropathy bil. feet and hand, HTN, s/p cervical fusion, s/p back surgery, IBS, asthma, Covid in Jan 2021    Examination-Activity Limitations Hygiene/Grooming;Locomotion Level;Reach Overhead;Stairs;Stand;Transfers    Examination-Participation Restrictions Recruitment consultant Evolving/Moderate complexity    Rehab Potential Good    PT Frequency 2x / week    PT Duration 12 weeks    PT Treatment/Interventions ADLs/Self Care Home Management;DME Instruction;Gait training;Stair training;Functional mobility training;Therapeutic activities;Therapeutic exercise;Balance training;Neuromuscular re-education;Patient/family education;Orthotic Fit/Training;Prosthetic Training;Vestibular    PT Next Visit Plan work towards updated STGs, prosthetic gait with forearm crutches community activities & cane household activities, therapuetic exercise for endurance & strength, standing balance with decreased UE support & facilitating upright posture    PT Home  Exercise Plan Access Code: [email or web address]    at sink for mini squats with UE support, weight shifts with UE support, and balance feet apart no Ue support all with chair behind her    Consulted and Agree with Plan of Care Patient           Patient will benefit from skilled therapeutic intervention in order to improve the following deficits and impairments:  Abnormal gait, Decreased activity tolerance, Decreased balance, Decreased endurance, Decreased knowledge of use of DME, Decreased mobility, Decreased range of motion, Decreased safety awareness, Decreased strength, Increased edema, Impaired flexibility, Impaired sensation, Postural dysfunction, Prosthetic Dependency, Obesity, Pain  Visit Diagnosis: Other abnormalities of gait and mobility  Unsteadiness on feet  Abnormal posture  Weakness generalized  Stiffness of left knee, not elsewhere classified     Problem List Patient Active Problem List   Diagnosis Date Noted  . History of below knee amputation, left ([place]) 07/30/2019  . Gangrene of left foot ([place]) 05/16/2019  . Subacute osteomyelitis of left foot ([place])   . Charcot's joint of foot, left   . Post-operative state   . PAD (peripheral artery disease) ([place]) 12/05/2018  . Osteomyelitis of ankle or foot, acute, left ([place]) 12/05/2018  . MRSA bacteremia 12/05/2018  . Sepsis without acute organ dysfunction ([place])   . Charcot's joint of foot due to diabetes ([place]) 09/18/2018  . BMI 36.0-36.9,adult   . Morbid obesity ([place]) 11/19/2017  . B12 deficiency 11/01/2017  . Diabetic foot ulcer ([place]) 12/14/2016  . Diabetic polyneuropathy associated with type 2 diabetes mellitus ([place]) 12/13/2015  . Lumbar stenosis with neurogenic claudication 09/02/2015  . Chronic constipation 07/13/2015  . OSA (obstructive sleep apnea) 06/25/2015  . 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/23/2015  . Hypertriglyceridemia 04/23/2015  . Asthma, mild intermittent 02/01/2015  . Type 2 diabetes mellitus  with diabetic nephropathy ([place]) 12/31/2014  . Gastroesophageal reflux disease with esophagitis 12/31/2014  . Dysphagia 12/31/2014  . Bilateral carpal tunnel syndrome 12/01/2014  . Diverticulosis of colon (without mention of hemorrhage) 11/30/2012  . Essential hypertension, benign 11/28/2012    Jamey Reas  PT, DPT 11/13/2019, 3:55 PM  [facility] [address] [place], [place], [address] Phone: [phone]   Fax:  [phone]  Name: WILMARIE SPARLIN MRN: [id number] Date of Birth: 1955-10-30

## 2019-11-13 NOTE — Patient Instructions (Signed)
Access Code: [email or web address] URL: [email or web address] Date: 11/13/2019 Prepared by: [facility]  Exercises Seated Hip External Rotation Stretch - 1-3 x daily - 7 x weekly - 1 sets - 2-3 reps - 10-20 seconds hold Seated Knee Extension Stretch with Chair - 1-3 x daily - 7 x weekly - 1 sets - 2-3 reps - 10-20 seconds hold Seated Flexion Stretch - 1-3 x daily - 7 x weekly - 1 sets - 2-3 reps - 10-20 seconds hold Upright Stance at Door Frame Single Arm - 1-3 x daily - 7 x weekly - 1 sets - 2 reps - 2 deep breathes hold Upright Stance at Door Frame with Both Arms - 1-3 x daily - 7 x weekly - 1 sets - 2 reps - 2 deep breathes hold

## 2019-11-14 ENCOUNTER — Encounter: Payer: Self-pay | Admitting: Physician Assistant

## 2019-11-14 ENCOUNTER — Ambulatory Visit (INDEPENDENT_AMBULATORY_CARE_PROVIDER_SITE_OTHER): Payer: Medicare Other | Admitting: Physician Assistant

## 2019-11-14 VITALS — Ht 60.0 in | Wt 187.0 lb

## 2019-11-14 DIAGNOSIS — S88119A Complete traumatic amputation at level between knee and ankle, unspecified lower leg, initial encounter: Secondary | ICD-10-CM

## 2019-11-14 NOTE — Progress Notes (Signed)
Office Visit Note   Patient: Olivia Romero           Date of Birth: 28-Nov-1955           MRN: [id number] Visit Date: 11/14/2019              Requested by: [name], [place],  [place] [address] PCP: Delsa Grana, PA-C  Chief Complaint  Patient presents with  . Left Leg - Follow-up    05/16/19 left BKA       HPI: This is a pleasant 64 year old woman who is 6 months status post left below-knee amputation.  She is doing extremely well and working with physical therapy she has no complaints she is also wondering about a compression stocking for her right lower extremity  Assessment & Plan: Visit Diagnoses: No diagnosis found.  Plan: She will continue with physical therapy.  She may follow-up with [place] as needed She was fitted for an extra-large vive compression stocking Follow-Up Instructions: No follow-ups on file.   Ortho Exam  Patient is alert, oriented, no adenopathy, well-dressed, normal affect, normal respiratory effort. Left lower extremity well-healed amputation stump.  No erythema no swelling excellent knee range of motion no cellulitis  Imaging: No results found. No images are attached to the encounter.  Labs: Lab Results  Component Value Date   HGBA1C 7.3 (H) 05/16/2019   HGBA1C 7.9 09/17/2018   HGBA1C 7.0 05/08/2018   ESRSEDRATE 60 (H) 12/05/2018   ESRSEDRATE 41 (H) 12/04/2018   ESRSEDRATE 30 07/12/2018   CRP 22.0 (H) 12/05/2018   CRP 20 (H) 12/04/2018   CRP 6 07/12/2018   LABURIC 8.2 (H) 11/30/2015   REPTSTATUS 12/12/2018 FINAL 12/06/2018   GRAMSTAIN  12/06/2018    RARE WBC PRESENT, PREDOMINANTLY PMN NO ORGANISMS SEEN    CULT  12/06/2018    RARE METHICILLIN RESISTANT STAPHYLOCOCCUS AUREUS WITH NORMAL SKIN FLORA NO ANAEROBES ISOLATED Performed at [facility], [place] [address] [place], [place] [address]    LABORGA METHICILLIN RESISTANT STAPHYLOCOCCUS AUREUS 12/06/2018     Lab Results  Component Value  Date   ALBUMIN 3.7 12/04/2018   ALBUMIN 4.0 11/27/2018   ALBUMIN 3.8 03/11/2018   PREALBUMIN 21.5 03/12/2018   LABURIC 8.2 (H) 11/30/2015    No results found for: MG Lab Results  Component Value Date   VD25OH 11 (L) 09/20/2016    Lab Results  Component Value Date   PREALBUMIN 21.5 03/12/2018   CBC EXTENDED Latest Ref Rng & Units 05/16/2019 12/11/2018 12/11/2018  WBC 4.0 - 10.5 K/uL 8.4 10.6(H) 11.6(H)  RBC 3.87 - 5.11 MIL/uL 4.35 3.73(L) 3.76(L)  HGB 12.0 - 15.0 g/dL 11.2(L) 9.8(L) 9.8(L)  HCT 36 - 46 % 36.1 30.8(L) 31.3(L)  PLT 150 - 400 K/uL 310 503(H) 482(H)  NEUTROABS 1.7 - 7.7 K/uL - - -  LYMPHSABS 0.7 - 4.0 K/uL - - -     Body mass index is 36.52 kg/m.  Orders:  No orders of the defined types were placed in this encounter.  No orders of the defined types were placed in this encounter.    Procedures: No procedures performed  Clinical Data: No additional findings.  ROS:  All other systems negative, except as noted in the HPI. Review of Systems  Objective: Vital Signs: Ht 5' (1.524 m)   Wt 187 lb (84.8 kg)   LMP  (LMP Unknown)   BMI 36.52 kg/m   Specialty Comments:  No specialty  comments available.  PMFS History: Patient Active Problem List   Diagnosis Date Noted  . History of below knee amputation, left ([place]) 07/30/2019  . Gangrene of left foot ([place]) 05/16/2019  . Subacute osteomyelitis of left foot ([place])   . Charcot's joint of foot, left   . Post-operative state   . PAD (peripheral artery disease) ([place]) 12/05/2018  . Osteomyelitis of ankle or foot, acute, left ([place]) 12/05/2018  . MRSA bacteremia 12/05/2018  . Sepsis without acute organ dysfunction ([place])   . Charcot's joint of foot due to diabetes ([place]) 09/18/2018  . BMI 36.0-36.9,adult   . Morbid obesity ([place]) 11/19/2017  . B12 deficiency 11/01/2017  . Diabetic foot ulcer ([place]) 12/14/2016  . Diabetic polyneuropathy associated with type 2 diabetes mellitus ([place]) 12/13/2015  . Lumbar  stenosis with neurogenic claudication 09/02/2015  . Chronic constipation 07/13/2015  . OSA (obstructive sleep apnea) 06/25/2015  . 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/23/2015  . Hypertriglyceridemia 04/23/2015  . Asthma, mild intermittent 02/01/2015  . Type 2 diabetes mellitus with diabetic nephropathy ([place]) 12/31/2014  . Gastroesophageal reflux disease with esophagitis 12/31/2014  . Dysphagia 12/31/2014  . Bilateral carpal tunnel syndrome 12/01/2014  . Diverticulosis of colon (without mention of hemorrhage) 11/30/2012  . Essential hypertension, benign 11/28/2012   Past Medical History:  Diagnosis Date  . Anemia   . Arthritis   . Asthma   . Colon polyps    adenomatous  . Diabetes mellitus without complication ([place])   . Diabetic infection of left foot ([place]) 12/2018  . Diverticulosis of colon   . Esophagitis   . GERD (gastroesophageal reflux disease)   . Hemorrhoid    internal  . Hyperlipemia   . Hypertension   . IBS (irritable bowel syndrome)    no current prob - diet controlled  . Myalgia due to statin 11/19/2017  . Neuropathy   . Neuropathy of both feet   . Neuropathy of hand   . Pneumonia    x 4  . PONV (postoperative nausea and vomiting)    on some surgeries but not all procedures  . Skin ulcer of right ankle, limited to breakdown of skin ([place]) 12/31/2016   resolved per patient 05/14/19  . Sleep apnea    has had in the past lost 50 pounds and do longer uses cpap  . Statin intolerance 04/23/2015  . Stroke [facility]) 2009   mini stroke per patient  . Stroke-like episode 2009   TIA - mini stroke per patient  . 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 ([place]) 07/13/2015  . Uncontrolled type 2 diabetes mellitus with hyperglycemia ([place]) 11/01/2017    Family History  Problem Relation Age of Onset  . Lung cancer Father   . Hypertension Father   . Arthritis Father   . Other Mother        hardening of the arteries/renal cell carcenoma  .  Hypertension Mother   . Stroke Mother   . Kidney cancer Mother   . Arthritis Brother   . Rheum arthritis Maternal Uncle   . Bladder Cancer Neg Hx     Past Surgical History:  Procedure Laterality Date  . ABDOMINAL HYSTERECTOMY    . AMPUTATION Left 12/06/2018   Procedure: PARTIAL AMPUTATION LEFT FOOT;  Surgeon: Edrick Kins, DPM;  Location: [place];  Service: Podiatry;  Laterality: Left;  . AMPUTATION Left 05/16/2019   Procedure: LEFT BELOW KNEE AMPUTATION;  Surgeon: Newt Minion, MD;  Location: [place];  Service: Orthopedics;  Laterality: Left;  . BACK SURGERY  09/02/2015  . BONE BIOPSY Left 12/06/2018   Procedure: Bone Biopsy;  Surgeon: Edrick Kins, DPM;  Location: [place];  Service: Podiatry;  Laterality: Left;  . CERVICAL FUSION    . CESAREAN SECTION  1986  . COLONOSCOPY N/A 11/30/2012   Procedure: COLONOSCOPY;  Surgeon: Irene Shipper, MD;  Location: [facility]COPY;  Service: Endoscopy;  Laterality: N/A;  . COLONOSCOPY WITH PROPOFOL N/A 01/03/2016   Procedure: COLONOSCOPY WITH PROPOFOL;  Surgeon: Manya Silvas, MD;  Location: [facility]COPY;  Service: Endoscopy;  Laterality: N/A;  . ESOPHAGOGASTRODUODENOSCOPY (EGD) WITH PROPOFOL N/A 01/14/2015   Procedure: ESOPHAGOGASTRODUODENOSCOPY (EGD) WITH PROPOFOL;  Surgeon: Manya Silvas, MD;  Location: [facility] ENDOSCOPY;  Service: Endoscopy;  Laterality: N/A;  . IRRIGATION AND DEBRIDEMENT FOOT Left 03/13/2018   Procedure: IRRIGATION AND DEBRIDEMENT FOOT WITH BONE BIOPSY WITH MISONIX DEBRIDER;  Surgeon: Evelina Bucy, DPM;  Location: [place];  Service: Podiatry;  Laterality: Left;  . IRRIGATION AND DEBRIDEMENT FOOT Left 12/06/2018   Procedure: Irrigation And Debridement Foot;  Surgeon: Edrick Kins, DPM;  Location: [place];  Service: Podiatry;  Laterality: Left;  . LEG AMPUTATION BELOW KNEE Left   . LUMBAR WOUND DEBRIDEMENT N/A 10/01/2015   Procedure: LUMBAR WOUND DEBRIDEMENT;  Surgeon: Ashok Pall, MD;  Location: [place] NEURO ORS;  Service: Neurosurgery;   Laterality: N/A;  LUMBAR WOUND DEBRIDEMENT  . NASAL SINUS SURGERY    . SAVORY DILATION N/A 01/14/2015   Procedure: SAVORY DILATION;  Surgeon: Manya Silvas, MD;  Location: [facility]COPY;  Service: Endoscopy;  Laterality: N/A;  . TONSILLECTOMY    . WISDOM TOOTH EXTRACTION     Social History   Occupational History  . Occupation: histology Engineer, production: [employer]  Tobacco Use  . Smoking status: Never Smoker  . Smokeless tobacco: Never Used  Vaping Use  . Vaping Use: Never used  Substance and Sexual Activity  . Alcohol use: Never  . Drug use: No  . Sexual activity: Not Currently    Comment: Hysterectomy

## 2019-11-18 ENCOUNTER — Other Ambulatory Visit: Payer: Self-pay

## 2019-11-18 ENCOUNTER — Ambulatory Visit (INDEPENDENT_AMBULATORY_CARE_PROVIDER_SITE_OTHER): Payer: Medicare Other | Admitting: Physical Therapy

## 2019-11-18 ENCOUNTER — Encounter: Payer: Self-pay | Admitting: Physical Therapy

## 2019-11-18 DIAGNOSIS — M25662 Stiffness of left knee, not elsewhere classified: Secondary | ICD-10-CM

## 2019-11-18 DIAGNOSIS — R293 Abnormal posture: Secondary | ICD-10-CM

## 2019-11-18 DIAGNOSIS — R2681 Unsteadiness on feet: Secondary | ICD-10-CM | POA: Diagnosis not present

## 2019-11-18 DIAGNOSIS — R531 Weakness: Secondary | ICD-10-CM

## 2019-11-18 DIAGNOSIS — R2689 Other abnormalities of gait and mobility: Secondary | ICD-10-CM

## 2019-11-18 DIAGNOSIS — Z9181 History of falling: Secondary | ICD-10-CM

## 2019-11-18 NOTE — Therapy (Signed)
[facility] [address] [place], [place], [address] Phone: [phone]   Fax:  [phone]  Physical Therapy Treatment  Patient Details  Name: Olivia Romero MRN: [id number] Date of Birth: Aug 09, 1955 Referring Provider (PT): Meridee Score, MD   Encounter Date: 11/18/2019   PT End of Session - 11/18/19 1100    Visit Number 13    Number of Visits 25    Date for PT Re-Evaluation 12/21/19    Authorization Type UHC Medicare, $30 co-pay, no visit limit    Progress Note Due on Visit 20    PT Start Time 1059    PT Stop Time 1144    PT Time Calculation (min) 45 min    Equipment Utilized During Treatment Gait belt    Activity Tolerance Patient tolerated treatment well    Behavior During Therapy [facility] for tasks assessed/performed           Past Medical History:  Diagnosis Date  . Anemia   . Arthritis   . Asthma   . Colon polyps    adenomatous  . Diabetes mellitus without complication ([place])   . Diabetic infection of left foot ([place]) 12/2018  . Diverticulosis of colon   . Esophagitis   . GERD (gastroesophageal reflux disease)   . Hemorrhoid    internal  . Hyperlipemia   . Hypertension   . IBS (irritable bowel syndrome)    no current prob - diet controlled  . Myalgia due to statin 11/19/2017  . Neuropathy   . Neuropathy of both feet   . Neuropathy of hand   . Pneumonia    x 4  . PONV (postoperative nausea and vomiting)    on some surgeries but not all procedures  . Skin ulcer of right ankle, limited to breakdown of skin ([place]) 12/31/2016   resolved per patient 05/14/19  . Sleep apnea    has had in the past lost 50 pounds and do longer uses cpap  . Statin intolerance 04/23/2015  . Stroke [facility]) 2009   mini stroke per patient  . Stroke-like episode 2009   TIA - mini stroke per patient  . 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 ([place]) 07/13/2015  . Uncontrolled type 2 diabetes mellitus with hyperglycemia ([place]) 11/01/2017     Past Surgical History:  Procedure Laterality Date  . ABDOMINAL HYSTERECTOMY    . AMPUTATION Left 12/06/2018   Procedure: PARTIAL AMPUTATION LEFT FOOT;  Surgeon: Edrick Kins, DPM;  Location: [place];  Service: Podiatry;  Laterality: Left;  . AMPUTATION Left 05/16/2019   Procedure: LEFT BELOW KNEE AMPUTATION;  Surgeon: Newt Minion, MD;  Location: [place];  Service: Orthopedics;  Laterality: Left;  . BACK SURGERY  09/02/2015  . BONE BIOPSY Left 12/06/2018   Procedure: Bone Biopsy;  Surgeon: Edrick Kins, DPM;  Location: [place];  Service: Podiatry;  Laterality: Left;  . CERVICAL FUSION    . CESAREAN SECTION  1986  . COLONOSCOPY N/A 11/30/2012   Procedure: COLONOSCOPY;  Surgeon: Irene Shipper, MD;  [facility];  Service: Endoscopy;  Laterality: N/A;  . COLONOSCOPY WITH PROPOFOL N/A 01/03/2016   Procedure: COLONOSCOPY WITH PROPOFOL;  Surgeon: Manya Silvas, MD;  Location: [facility] ENDOSCOPY;  Service: Endoscopy;  Laterality: N/A;  . ESOPHAGOGASTRODUODENOSCOPY (EGD) WITH PROPOFOL N/A 01/14/2015   Procedure: ESOPHAGOGASTRODUODENOSCOPY (EGD) WITH PROPOFOL;  Surgeon: Manya Silvas, MD;  Location: [facility] ENDOSCOPY;  Service: Endoscopy;  Laterality: N/A;  . IRRIGATION AND DEBRIDEMENT FOOT Left 03/13/2018  Procedure: IRRIGATION AND DEBRIDEMENT FOOT WITH BONE BIOPSY WITH MISONIX DEBRIDER;  Surgeon: Evelina Bucy, DPM;  Location: [place];  Service: Podiatry;  Laterality: Left;  . IRRIGATION AND DEBRIDEMENT FOOT Left 12/06/2018   Procedure: Irrigation And Debridement Foot;  Surgeon: Edrick Kins, DPM;  Location: [place];  Service: Podiatry;  Laterality: Left;  . LEG AMPUTATION BELOW KNEE Left   . LUMBAR WOUND DEBRIDEMENT N/A 10/01/2015   Procedure: LUMBAR WOUND DEBRIDEMENT;  Surgeon: Ashok Pall, MD;  Location: [place] NEURO ORS;  Service: Neurosurgery;  Laterality: N/A;  LUMBAR WOUND DEBRIDEMENT  . NASAL SINUS SURGERY    . SAVORY DILATION N/A 01/14/2015   Procedure: SAVORY DILATION;  Surgeon: Manya Silvas, MD;  Location: [facility] ENDOSCOPY;  Service: Endoscopy;  Laterality: N/A;  . TONSILLECTOMY    . WISDOM TOOTH EXTRACTION      There were no vitals filed for this visit.   Subjective Assessment - 11/18/19 1059    Subjective She has been doing her exercises including stretches. She got prescription for compression sock. She plans to order them.    Pertinent History L TTA, DM, CVA, sleep apnea, PNA, neuropathy bil. 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 No/denies    Pain Onset In the past 7 days                             [facility] Adult PT Treatment/Exercise - 11/18/19 1059      Transfers   Transfers Sit to Stand;Stand to [employer] Transfers    Sit to Stand 5: Supervision;With upper extremity assist;With armrests;From chair/3-in-1   to rollator walker   Sit to Stand Details Visual cues for safe use of DME/AE;Verbal cues for precautions/safety    Stand to Sit 5: Supervision;With upper extremity assist;With armrests;To chair/3-in-1   from rollator walker   Stand to Sit Details (indicate cue type and reason) Visual cues for safe use of DME/AE;Verbal cues for precautions/safety      Ambulation/Gait   Ambulation/Gait Yes    Ambulation/Gait Assistance 4: Min assist   cane prosthetic gait   Ambulation/Gait Assistance Details tactile / manual & verbal cues on upright posture, sequence and wt shift.     Ambulation Distance (Feet) 50 Feet   50' X 2 cane, 100' rollator walker / prosthesis   Assistive device Prosthesis;Rollator;Straight cane;1 person hand held assist   cane stand alone tip & HHA   Ambulation Surface Level;Indoor    Stairs --    [employer] --    Stair Management Technique --    Number  of Stairs --    Height of Stairs --    Ramp 5: Supervision   rollator walker & TTA prosthesis   Ramp Details (indicate cue type and reason) demo & verbal cues on sequence, wt shift, upright posture and step length.     Curb 5: Supervision   rollator walker & prosthesis   Curb Details (indicate cue type and reason) demo & verbal cues on sequence & technique with forearm crutches, prosthesis & AFO      Self-Care   ADL's elevation with BLEs higher than heart with demo & verbal cues. Pt verbalized understanding.  PT  reocmmended donning compression stocking upon arising to minimze edema & thereby increased difficulty.       Lumbar Exercises: Stretches   Active Hamstring Stretch Right;Left;2 reps;20 seconds    Active Hamstring Stretch Limitations seated with LE extended in 2nd chair anterior    Single Knee to Chest Stretch Right;Left;2 reps;20 seconds    Single Knee to Chest Stretch Limitations seated with foot at edge of 2nd chair anterior    Piriformis Stretch Right;Left;2 reps;20 seconds    Piriformis Stretch Limitations seated with LE positioned on 2nd chair anterior      Knee/Hip Exercises: Stretches   Active Hamstring Stretch --    Active Hamstring Stretch Limitations --    Piriformis Stretch --    Piriformis Stretch Limitations --    Other Knee/Hip Stretches Hip extensor stretch: seated with foot positioned at edge of chair anterior and trunk forward lean.  RLE & LLE 2 reps ea 20 sec hold      Knee/Hip Exercises: Aerobic   Nustep level 5 with BUEs & BLEs 10 minutes PT cues on technique & full motion      Knee/Hip Exercises: Machines for Strengthening   Cybex Knee Extension --    Cybex Knee Flexion --    Cybex Leg Press shuttle leg press BLEs 87# 15 reps, LLE 31# 10 reps, RLE 37# 10 reps with verbal cues on technique      Knee/Hip Exercises: Standing   Hip Flexion --    Hip Flexion Limitations --    Hip ADduction --    Hip ADduction Limitations --    Hip Abduction --     Abduction Limitations --    Hip Extension --    Extension Limitations --      Prosthetics   Current prosthetic wear tolerance (days/week)  daily    Current prosthetic wear tolerance (#hours/day)  prosthesis most of awake hours drying 4x/day    Residual limb condition  No open areas,  frail looking skin, pale color, colder temperature, no hair growth (shaving PT advised against) clinderical shape    Education Provided --                    PT Short Term Goals - 11/06/19 1115      PT SHORT TERM GOAL #1   Title Patient verbalizes understanding of adjusting ply socks with prosthesis wear. (All STGs Target Date: 11/28/2019)    Time 1    Period Months    Status On-going    Target Date 11/28/19      PT SHORT TERM GOAL #2   Title Patient tolerates prosthesis wear >12 hrs total/ day without skin or limb pain issues.    Time 1    Period Months    Status On-going    Target Date 11/28/19      PT SHORT TERM GOAL #3   Title Patient standing balance static with eyes open 20 seconds within 3 attempts and turns head right/left/up/down without UE support with supervision.    Time 1    Period Months    Status On-going    Target Date 11/28/19      PT SHORT TERM GOAL #4   Title Patient ambulates 200' with forearm crutches & prosthesis with supervision.    Time 1    Period Months    Status On-going    Target Date 11/28/19      PT SHORT TERM GOAL #5   Title patient negotiates ramps & curbs  with forearm crutches & prosthesis with supervision.    Time 1    Period Months    Status On-going    Target Date 11/28/19             PT Long Term Goals - 10/21/19 2211      PT LONG TERM GOAL #1   Title Patient verbalizes & demonstrates proper prosthetic care to enable safe utilization of prosthesis.  (All LTGs Target Date 12/19/2019)    Time 3    Period Months    Status On-going    Target Date 12/19/19      PT LONG TERM GOAL #2   Title Patient tolerates prosthesis wear & right  ankle orthosis wear >90% of awake hours to enable function throughout her day.    Time 3    Period Months    Status On-going    Target Date 12/19/19      PT LONG TERM GOAL #3   Title Standing balance: static without UE support eyes open for 30 seconds and dynamic with UE support reaching 10", pick up object from floor, managing clothes & static eyes closed 2 minutes safely modified independent.    Time 3    Period Months    Status On-going    Target Date 12/19/19      PT LONG TERM GOAL #4   Title Patient ambulates 300' including grass with cane or less and prosthesis modified independent for community access.    Time 3    Period Months    Status On-going    Target Date 12/19/19      PT LONG TERM GOAL #5   Title Patient negotiates ramps & curbs with cane or less & prosthesis modified independent for community access.    Time 3    Period Months    Status On-going    Target Date 12/19/19                 Plan - 11/18/19 1059    Clinical Impression Statement PT session worked on BLE stretches & leg press for strengthening. PT educated patient on proper elevation to address LE edema.  PT initiated household like gait with cane to enable free hand to carry items.    Personal Factors and Comorbidities Comorbidity 3+;Fitness;Time since onset of injury/illness/exacerbation    Comorbidities L TTA, DM, CVA, sleep apnea, PNA, neuropathy bil. feet and hand, HTN, s/p cervical fusion, s/p back surgery, IBS, asthma, Covid in Jan 2021    Examination-Activity Limitations Hygiene/Grooming;Locomotion Level;Reach Overhead;Stairs;Stand;Transfers    Examination-Participation Restrictions Recruitment consultant Evolving/Moderate complexity    Rehab Potential Good    PT Frequency 2x / week    PT Duration 12 weeks    PT Treatment/Interventions ADLs/Self Care Home Management;DME Instruction;Gait training;Stair training;Functional mobility  training;Therapeutic activities;Therapeutic exercise;Balance training;Neuromuscular re-education;Patient/family education;Orthotic Fit/Training;Prosthetic Training;Vestibular    PT Next Visit Plan work towards updated STGs, prosthetic gait with forearm crutches community activities & cane household activities, therapuetic exercise for endurance & strength, standing balance with decreased UE support & facilitating upright posture    PT Home Exercise Plan Access Code: [email or web address]    at sink for mini squats with UE support, weight shifts with UE support, and balance feet apart no Ue support all with chair behind her    Consulted and Agree with Plan of Care Patient           Patient will benefit from skilled therapeutic intervention in order to improve  the following deficits and impairments:  Abnormal gait, Decreased activity tolerance, Decreased balance, Decreased endurance, Decreased knowledge of use of DME, Decreased mobility, Decreased range of motion, Decreased safety awareness, Decreased strength, Increased edema, Impaired flexibility, Impaired sensation, Postural dysfunction, Prosthetic Dependency, Obesity, Pain  Visit Diagnosis: Other abnormalities of gait and mobility  Unsteadiness on feet  Abnormal posture  Weakness generalized  Stiffness of left knee, not elsewhere classified  History of fall     Problem List Patient Active Problem List   Diagnosis Date Noted  . History of below knee amputation, left ([place]) 07/30/2019  . Gangrene of left foot ([place]) 05/16/2019  . Subacute osteomyelitis of left foot ([place])   . Charcot's joint of foot, left   . Post-operative state   . PAD (peripheral artery disease) ([place]) 12/05/2018  . Osteomyelitis of ankle or foot, acute, left ([place]) 12/05/2018  . MRSA bacteremia 12/05/2018  . Sepsis without acute organ dysfunction ([place])   . Charcot's joint of foot due to diabetes ([place]) 09/18/2018  . BMI 36.0-36.9,adult   . Morbid obesity ([place]) 11/19/2017   . B12 deficiency 11/01/2017  . Diabetic foot ulcer ([place]) 12/14/2016  . Diabetic polyneuropathy associated with type 2 diabetes mellitus ([place]) 12/13/2015  . Lumbar stenosis with neurogenic claudication 09/02/2015  . Chronic constipation 07/13/2015  . OSA (obstructive sleep apnea) 06/25/2015  . 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/23/2015  . Hypertriglyceridemia 04/23/2015  . Asthma, mild intermittent 02/01/2015  . Type 2 diabetes mellitus with diabetic nephropathy ([place]) 12/31/2014  . Gastroesophageal reflux disease with esophagitis 12/31/2014  . Dysphagia 12/31/2014  . Bilateral carpal tunnel syndrome 12/01/2014  . Diverticulosis of colon (without mention of hemorrhage) 11/30/2012  . Essential hypertension, benign 11/28/2012    Jamey Reas  PT, DPT 11/18/2019, 1:38 PM  [facility] [address] [place], [place], [address] Phone: [phone]   Fax:  [phone]  Name: WESLIE RASMUS MRN: [id number] Date of Birth: July 05, 1955

## 2019-11-20 ENCOUNTER — Encounter: Payer: Self-pay | Admitting: Physical Therapy

## 2019-11-20 ENCOUNTER — Other Ambulatory Visit: Payer: Self-pay

## 2019-11-20 ENCOUNTER — Ambulatory Visit: Payer: Medicare Other | Admitting: Physical Therapy

## 2019-11-20 DIAGNOSIS — R293 Abnormal posture: Secondary | ICD-10-CM

## 2019-11-20 DIAGNOSIS — R2689 Other abnormalities of gait and mobility: Secondary | ICD-10-CM | POA: Diagnosis not present

## 2019-11-20 DIAGNOSIS — R2681 Unsteadiness on feet: Secondary | ICD-10-CM | POA: Diagnosis not present

## 2019-11-20 DIAGNOSIS — R531 Weakness: Secondary | ICD-10-CM

## 2019-11-20 DIAGNOSIS — M25662 Stiffness of left knee, not elsewhere classified: Secondary | ICD-10-CM

## 2019-11-20 NOTE — Therapy (Signed)
[facility] [address] [place], [place], [address] Phone: [phone]   Fax:  [phone]  Physical Therapy Treatment  Patient Details  Name: Olivia Romero MRN: [id number] Date of Birth: 03/13/1956 Referring Provider (PT): Meridee Score, MD   Encounter Date: 11/20/2019   PT End of Session - 11/20/19 0930    Visit Number 14    Number of Visits 25    Date for PT Re-Evaluation 12/21/19    Authorization Type [employer] Medicare, $30 co-pay, no visit limit    Progress Note Due on Visit 20    PT Start Time 0930    PT Stop Time 1018    PT Time Calculation (min) 48 min    Equipment Utilized During Treatment Gait belt    Activity Tolerance Patient tolerated treatment well    Behavior During Therapy [facility] for tasks assessed/performed           Past Medical History:  Diagnosis Date  . Anemia   . Arthritis   . Asthma   . Colon polyps    adenomatous  . Diabetes mellitus without complication ([place])   . Diabetic infection of left foot ([place]) 12/2018  . Diverticulosis of colon   . Esophagitis   . GERD (gastroesophageal reflux disease)   . Hemorrhoid    internal  . Hyperlipemia   . Hypertension   . IBS (irritable bowel syndrome)    no current prob - diet controlled  . Myalgia due to statin 11/19/2017  . Neuropathy   . Neuropathy of both feet   . Neuropathy of hand   . Pneumonia    x 4  . PONV (postoperative nausea and vomiting)    on some surgeries but not all procedures  . Skin ulcer of right ankle, limited to breakdown of skin ([place]) 12/31/2016   resolved per patient 05/14/19  . Sleep apnea    has had in the past lost 50 pounds and do longer uses cpap  . Statin intolerance 04/23/2015  . Stroke [facility]) 2009   mini stroke per patient  . Stroke-like episode 2009   TIA - mini stroke per patient  . 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 ([place]) 07/13/2015  . Uncontrolled type 2 diabetes mellitus with hyperglycemia ([place]) 11/01/2017     Past Surgical History:  Procedure Laterality Date  . ABDOMINAL HYSTERECTOMY    . AMPUTATION Left 12/06/2018   Procedure: PARTIAL AMPUTATION LEFT FOOT;  Surgeon: Edrick Kins, DPM;  Location: [place];  Service: Podiatry;  Laterality: Left;  . AMPUTATION Left 05/16/2019   Procedure: LEFT BELOW KNEE AMPUTATION;  Surgeon: Newt Minion, MD;  Location: [place];  Service: Orthopedics;  Laterality: Left;  . BACK SURGERY  09/02/2015  . BONE BIOPSY Left 12/06/2018   Procedure: Bone Biopsy;  Surgeon: Edrick Kins, DPM;  Location: [place];  Service: Podiatry;  Laterality: Left;  . CERVICAL FUSION    . CESAREAN SECTION  1986  . COLONOSCOPY N/A 11/30/2012   Procedure: COLONOSCOPY;  Surgeon: Irene Shipper, MD;  Location: [facility] ENDOSCOPY;  Service: Endoscopy;  Laterality: N/A;  . COLONOSCOPY WITH PROPOFOL N/A 01/03/2016   Procedure: COLONOSCOPY WITH PROPOFOL;  Surgeon: Manya Silvas, MD;  Location: [facility] ENDOSCOPY;  Service: Endoscopy;  Laterality: N/A;  . ESOPHAGOGASTRODUODENOSCOPY (EGD) WITH PROPOFOL N/A 01/14/2015   Procedure: ESOPHAGOGASTRODUODENOSCOPY (EGD) WITH PROPOFOL;  Surgeon: Manya Silvas, MD;  Location: [facility] ENDOSCOPY;  Service: Endoscopy;  Laterality: N/A;  . IRRIGATION AND DEBRIDEMENT FOOT Left 03/13/2018  Procedure: IRRIGATION AND DEBRIDEMENT FOOT WITH BONE BIOPSY WITH MISONIX DEBRIDER;  Surgeon: Evelina Bucy, DPM;  Location: [place];  Service: Podiatry;  Laterality: Left;  . IRRIGATION AND DEBRIDEMENT FOOT Left 12/06/2018   Procedure: Irrigation And Debridement Foot;  Surgeon: Edrick Kins, DPM;  Location: [place];  Service: Podiatry;  Laterality: Left;  . LEG AMPUTATION BELOW KNEE Left   . LUMBAR WOUND DEBRIDEMENT N/A 10/01/2015   Procedure: LUMBAR WOUND DEBRIDEMENT;  Surgeon: Ashok Pall, MD;  Location: [place] NEURO ORS;  Service: Neurosurgery;  Laterality: N/A;  LUMBAR WOUND DEBRIDEMENT  . NASAL SINUS SURGERY    . SAVORY DILATION N/A 01/14/2015   Procedure: SAVORY DILATION;  Surgeon: Manya Silvas, MD;  Location: [facility] ENDOSCOPY;  Service: Endoscopy;  Laterality: N/A;  . TONSILLECTOMY    . WISDOM TOOTH EXTRACTION      There were no vitals filed for this visit.   Subjective Assessment - 11/20/19 0930    Subjective Her muscles soreness is going well. She has been doing some elevation.    Pertinent History L TTA, DM, CVA, sleep apnea, PNA, neuropathy bil. 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 No/denies    Pain Onset In the past 7 days                             [facility] Adult PT Treatment/Exercise - 11/20/19 0930      Transfers   Transfers Sit to Stand;Stand to Sit;Stand Pivot Transfers    Sit to Stand 5: Supervision;With upper extremity assist;With armrests;From chair/3-in-1   to forearm crutches   Sit to Stand Details Visual cues for safe use of DME/AE;Verbal cues for precautions/safety;Verbal cues for technique    Stand to Sit 5: Supervision;With upper extremity assist;With armrests;To chair/3-in-1   from forearm crutches   Stand to Sit Details (indicate cue type and reason) Visual cues for safe use of DME/AE;Verbal cues for precautions/safety;Verbal cues for technique      Ambulation/Gait   Ambulation/Gait Yes    Ambulation/Gait Assistance 4: Min assist;4: Min guard   MinA single crutch & minGuard 2 forearm crutches, minA grass   Ambulation/Gait Assistance Details trialled cane stand alone tip vs single forearm crutch with better balance / gait with single forearm crutch.  Tactile & verbal cues on sequence, step length, upright posture & wt shift.  Progressed to ambulating with forearm cructhes on grass at end of session.     Ambulation Distance (Feet) 150 Feet   cane 15' & 25' 2 crutches 150'  1 crutch 30' X 2, grass 50'   Assistive device Prosthesis;Rollator;Straight cane;1 person hand held assist;R Forearm Crutch;Lofstrands    Ambulation Surface Level;Indoor    Ramp 4: Min assist   forearm crutches & TTA prosthesis   Ramp Details (indicate cue type and reason) demo & verbal cues on sequence, wt shift, upright posture and step length.     Curb 4: Min assist   forearm crutches & prosthesis   Curb Details (indicate cue type and reason) demo & verbal cues on sequence & technique with forearm crutches, prosthesis & AFO      Self-Care   ADL's use of basket on rollator seat  Lumbar Exercises: Stretches   Active Hamstring Stretch --    Active Hamstring Stretch Limitations --    Single Knee to Chest Stretch --    Single Knee to Chest Stretch Limitations --    Piriformis Stretch --    Piriformis Stretch Limitations --      Knee/Hip Exercises: Stretches   Other Knee/Hip Stretches --      Knee/Hip Exercises: Aerobic   Nustep level 5 with BUEs & BLEs 8 minutes PT cues on technique & full motion      Knee/Hip Exercises: Machines for Strengthening   Cybex Knee Extension 10# BLEs 10 reps Batca    Cybex Knee Flexion Batca seated hamstring curls 20# BLEs 10 reps    Cybex Leg Press shuttle leg press BLEs 87# 15 reps, LLE 37# 5 reps, RLE 37# 5 reps with verbal cues on technique      Prosthetics   Current prosthetic wear tolerance (days/week)  daily    Current prosthetic wear tolerance (#hours/day)  prosthesis most of awake hours drying 4x/day    Residual limb condition  No open areas,  frail looking skin, pale color, colder temperature, no hair growth (shaving PT advised against) clinderical shape                    PT Short Term Goals - 11/06/19 1115      PT SHORT TERM GOAL #1   Title Patient verbalizes understanding of adjusting ply socks with prosthesis wear. (All STGs Target Date: 11/28/2019)    Time 1    Period Months    Status On-going    Target Date 11/28/19       PT SHORT TERM GOAL #2   Title Patient tolerates prosthesis wear >12 hrs total/ day without skin or limb pain issues.    Time 1    Period Months    Status On-going    Target Date 11/28/19      PT SHORT TERM GOAL #3   Title Patient standing balance static with eyes open 20 seconds within 3 attempts and turns head right/left/up/down without UE support with supervision.    Time 1    Period Months    Status On-going    Target Date 11/28/19      PT SHORT TERM GOAL #4   Title Patient ambulates 200' with forearm crutches & prosthesis with supervision.    Time 1    Period Months    Status On-going    Target Date 11/28/19      PT SHORT TERM GOAL #5   Title patient negotiates ramps & curbs with forearm crutches & prosthesis with supervision.    Time 1    Period Months    Status On-going    Target Date 11/28/19             PT Long Term Goals - 10/21/19 2211      PT LONG TERM GOAL #1   Title Patient verbalizes & demonstrates proper prosthetic care to enable safe utilization of prosthesis.  (All LTGs Target Date 12/19/2019)    Time 3    Period Months    Status On-going    Target Date 12/19/19      PT LONG TERM GOAL #2   Title Patient tolerates prosthesis wear & right ankle orthosis wear >90% of awake hours to enable function throughout her day.    Time 3    Period Months    Status On-going    Target Date  12/19/19      PT LONG TERM GOAL #3   Title Standing balance: static without UE support eyes open for 30 seconds and dynamic with UE support reaching 10", pick up object from floor, managing clothes & static eyes closed 2 minutes safely modified independent.    Time 3    Period Months    Status On-going    Target Date 12/19/19      PT LONG TERM GOAL #4   Title Patient ambulates 300' including grass with cane or less and prosthesis modified independent for community access.    Time 3    Period Months    Status On-going    Target Date 12/19/19      PT LONG TERM GOAL  #5   Title Patient negotiates ramps & curbs with cane or less & prosthesis modified independent for community access.    Time 3    Period Months    Status On-going    Target Date 12/19/19                 Plan - 11/20/19 0934    Clinical Impression Statement PT worked on prosthetic gait with forearm crutches including on grass surface. PT assessed gait with single UE assistive device as potential for carrying items in home and appears a single forearm crutch was best for her.  PT progressing strengthening exercises.    Personal Factors and Comorbidities Comorbidity 3+;Fitness;Time since onset of injury/illness/exacerbation    Comorbidities L TTA, DM, CVA, sleep apnea, PNA, neuropathy bil. feet and hand, HTN, s/p cervical fusion, s/p back surgery, IBS, asthma, Covid in Jan 2021    Examination-Activity Limitations Hygiene/Grooming;Locomotion Level;Reach Overhead;Stairs;Stand;Transfers    Examination-Participation Restrictions Recruitment consultant Evolving/Moderate complexity    Rehab Potential Good    PT Frequency 2x / week    PT Duration 12 weeks    PT Treatment/Interventions ADLs/Self Care Home Management;DME Instruction;Gait training;Stair training;Functional mobility training;Therapeutic activities;Therapeutic exercise;Balance training;Neuromuscular re-education;Patient/family education;Orthotic Fit/Training;Prosthetic Training;Vestibular    PT Next Visit Plan check STGs, prosthetic gait with forearm crutches community activities & single crutch household activities, therapuetic exercise for endurance & strength, standing balance with decreased UE support & facilitating upright posture    PT Home Exercise Plan Access Code: [email or web address]    at sink for mini squats with UE support, weight shifts with UE support, and balance feet apart no Ue support all with chair behind her    Consulted and Agree with Plan of Care Patient           Patient  will benefit from skilled therapeutic intervention in order to improve the following deficits and impairments:  Abnormal gait, Decreased activity tolerance, Decreased balance, Decreased endurance, Decreased knowledge of use of DME, Decreased mobility, Decreased range of motion, Decreased safety awareness, Decreased strength, Increased edema, Impaired flexibility, Impaired sensation, Postural dysfunction, Prosthetic Dependency, Obesity, Pain  Visit Diagnosis: Other abnormalities of gait and mobility  Unsteadiness on feet  Abnormal posture  Weakness generalized  Stiffness of left knee, not elsewhere classified     Problem List Patient Active Problem List   Diagnosis Date Noted  . History of below knee amputation, left ([place]) 07/30/2019  . Gangrene of left foot ([place]) 05/16/2019  . Subacute osteomyelitis of left foot ([place])   . Charcot's joint of foot, left   . Post-operative state   . PAD (peripheral artery disease) ([place]) 12/05/2018  . Osteomyelitis of ankle or foot, acute, left ([place]) 12/05/2018  .  MRSA bacteremia 12/05/2018  . Sepsis without acute organ dysfunction ([place])   . Charcot's joint of foot due to diabetes ([place]) 09/18/2018  . BMI 36.0-36.9,adult   . Morbid obesity ([place]) 11/19/2017  . B12 deficiency 11/01/2017  . Diabetic foot ulcer ([place]) 12/14/2016  . Diabetic polyneuropathy associated with type 2 diabetes mellitus ([place]) 12/13/2015  . Lumbar stenosis with neurogenic claudication 09/02/2015  . Chronic constipation 07/13/2015  . OSA (obstructive sleep apnea) 06/25/2015  . 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/23/2015  . Hypertriglyceridemia 04/23/2015  . Asthma, mild intermittent 02/01/2015  . Type 2 diabetes mellitus with diabetic nephropathy ([place]) 12/31/2014  . Gastroesophageal reflux disease with esophagitis 12/31/2014  . Dysphagia 12/31/2014  . Bilateral carpal tunnel syndrome 12/01/2014  . Diverticulosis of colon (without mention of hemorrhage)  11/30/2012  . Essential hypertension, benign 11/28/2012    Jamey Reas PT, DPT 11/20/2019, 11:39 AM  [facility] [address] [place], [place], [address] Phone: [phone]   Fax:  [phone]  Name: Olivia Romero MRN: [id number] Date of Birth: 1955/09/19

## 2019-11-24 ENCOUNTER — Ambulatory Visit (INDEPENDENT_AMBULATORY_CARE_PROVIDER_SITE_OTHER): Payer: Medicare Other | Admitting: Physical Therapy

## 2019-11-24 ENCOUNTER — Encounter: Payer: Self-pay | Admitting: Physical Therapy

## 2019-11-24 ENCOUNTER — Other Ambulatory Visit: Payer: Self-pay

## 2019-11-24 DIAGNOSIS — R531 Weakness: Secondary | ICD-10-CM

## 2019-11-24 DIAGNOSIS — Z9181 History of falling: Secondary | ICD-10-CM

## 2019-11-24 DIAGNOSIS — R293 Abnormal posture: Secondary | ICD-10-CM

## 2019-11-24 DIAGNOSIS — R2681 Unsteadiness on feet: Secondary | ICD-10-CM | POA: Diagnosis not present

## 2019-11-24 DIAGNOSIS — R2689 Other abnormalities of gait and mobility: Secondary | ICD-10-CM

## 2019-11-24 NOTE — Therapy (Signed)
[facility] [address] [place], [place], [address] Phone: [phone]   Fax:  [phone]  Physical Therapy Treatment  Patient Details  Name: Olivia Romero MRN: [id number] Date of Birth: 1956/02/03 Referring Provider (PT): Meridee Score, MD   Encounter Date: 11/24/2019   PT End of Session - 11/24/19 1256    Visit Number 15    Number of Visits 25    Date for PT Re-Evaluation 12/21/19    Authorization Type [employer] Medicare, $30 co-pay, no visit limit    Progress Note Due on Visit 20    PT Start Time 1050   transportation made her late   PT Stop Time 1115    PT Time Calculation (min) 25 min    Equipment Utilized During Treatment Gait belt    Activity Tolerance Patient tolerated treatment well    Behavior During Therapy [facility] for tasks assessed/performed           Past Medical History:  Diagnosis Date  . Anemia   . Arthritis   . Asthma   . Colon polyps    adenomatous  . Diabetes mellitus without complication ([place])   . Diabetic infection of left foot ([place]) 12/2018  . Diverticulosis of colon   . Esophagitis   . GERD (gastroesophageal reflux disease)   . Hemorrhoid    internal  . Hyperlipemia   . Hypertension   . IBS (irritable bowel syndrome)    no current prob - diet controlled  . Myalgia due to statin 11/19/2017  . Neuropathy   . Neuropathy of both feet   . Neuropathy of hand   . Pneumonia    x 4  . PONV (postoperative nausea and vomiting)    on some surgeries but not all procedures  . Skin ulcer of right ankle, limited to breakdown of skin ([place]) 12/31/2016   resolved per patient 05/14/19  . Sleep apnea    has had in the past lost 50 pounds and do longer uses cpap  . Statin intolerance 04/23/2015  . Stroke [facility]) 2009   mini stroke per patient  . Stroke-like episode 2009   TIA - mini stroke per patient  . 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 ([place]) 07/13/2015  . Uncontrolled type 2 diabetes mellitus with  hyperglycemia ([place]) 11/01/2017    Past Surgical History:  Procedure Laterality Date  . ABDOMINAL HYSTERECTOMY    . AMPUTATION Left 12/06/2018   Procedure: PARTIAL AMPUTATION LEFT FOOT;  Surgeon: Edrick Kins, DPM;  Location: [place];  Service: Podiatry;  Laterality: Left;  . AMPUTATION Left 05/16/2019   Procedure: LEFT BELOW KNEE AMPUTATION;  Surgeon: Newt Minion, MD;  Location: [place];  Service: Orthopedics;  Laterality: Left;  . BACK SURGERY  09/02/2015  . BONE BIOPSY Left 12/06/2018   Procedure: Bone Biopsy;  Surgeon: Edrick Kins, DPM;  Location: [place];  Service: Podiatry;  Laterality: Left;  . CERVICAL FUSION    . CESAREAN SECTION  1986  . COLONOSCOPY N/A 11/30/2012   Procedure: COLONOSCOPY;  Surgeon: Irene Shipper, MD;  Location: [facility] ENDOSCOPY;  Service: Endoscopy;  Laterality: N/A;  . COLONOSCOPY WITH PROPOFOL N/A 01/03/2016   Procedure: COLONOSCOPY WITH PROPOFOL;  Surgeon: Manya Silvas, MD;  Location: [facility] ENDOSCOPY;  Service: Endoscopy;  Laterality: N/A;  . ESOPHAGOGASTRODUODENOSCOPY (EGD) WITH PROPOFOL N/A 01/14/2015   Procedure: ESOPHAGOGASTRODUODENOSCOPY (EGD) WITH PROPOFOL;  Surgeon: Manya Silvas, MD;  Location: [facility] ENDOSCOPY;  Service: Endoscopy;  Laterality: N/A;  . IRRIGATION  AND DEBRIDEMENT FOOT Left 03/13/2018   Procedure: IRRIGATION AND DEBRIDEMENT FOOT WITH BONE BIOPSY WITH MISONIX DEBRIDER;  Surgeon: Evelina Bucy, DPM;  Location: [place];  Service: Podiatry;  Laterality: Left;  . IRRIGATION AND DEBRIDEMENT FOOT Left 12/06/2018   Procedure: Irrigation And Debridement Foot;  Surgeon: Edrick Kins, DPM;  Location: [place];  Service: Podiatry;  Laterality: Left;  . LEG AMPUTATION BELOW KNEE Left   . LUMBAR WOUND DEBRIDEMENT N/A 10/01/2015   Procedure: LUMBAR WOUND DEBRIDEMENT;  Surgeon: Ashok Pall, MD;  Location: [place] NEURO ORS;  Service: Neurosurgery;  Laterality: N/A;  LUMBAR WOUND DEBRIDEMENT  . NASAL SINUS SURGERY    . SAVORY DILATION N/A 01/14/2015   Procedure:  SAVORY DILATION;  Surgeon: Manya Silvas, MD;  Location: [facility] ENDOSCOPY;  Service: Endoscopy;  Laterality: N/A;  . TONSILLECTOMY    . WISDOM TOOTH EXTRACTION      There were no vitals filed for this visit.                      [place] Adult PT Treatment/Exercise - 11/24/19 1050      Transfers   Transfers Sit to Stand;Stand to [employer]    Sit to Stand 5: Supervision;With upper extremity assist;With armrests;From chair/3-in-1   to forearm crutches   Sit to Stand Details Visual cues for safe use of DME/AE;Verbal cues for precautions/safety;Verbal cues for technique    Stand to Sit 5: Supervision;With upper extremity assist;With armrests;To chair/3-in-1   from forearm crutches   Stand to Sit Details (indicate cue type and reason) Visual cues for safe use of DME/AE;Verbal cues for precautions/safety;Verbal cues for technique      Ambulation/Gait   Ambulation/Gait Yes    Ambulation/Gait Assistance 4: Min guard;5: Supervision    Ambulation/Gait Assistance Details tactile / manual & verbal cues on upright posture, sequence and wt shift.     Ambulation Distance (Feet) 200 Feet    Assistive device Prosthesis;Straight cane;1 person hand held assist;Lofstrands;Rollator   enter/ exit with rollator   Ambulation Surface Level;Indoor    Ramp 4: Min assist   forearm crutches & TTA prosthesis   Ramp Details (indicate cue type and reason) demo & verbal cues on sequence, wt shift, upright posture and step length.    Curb 4: Min assist   forearm crutches & prosthesis   Curb Details (indicate cue type and reason) demo & verbal cues on sequence & technique with forearm crutches, prosthesis & AFO      Self-Care   ADL's seated on 24" bar stool with feet supported on floor. PT recommended for kitchen ADLs with less UE support & will facilitate core stability that carries over into standing.        Knee/Hip Exercises: Aerobic   Nustep --      Knee/Hip Exercises: Machines  for Strengthening   Cybex Knee Extension --    Cybex Knee Flexion --    Cybex Leg Press shuttle leg press BLEs 87# 15 reps, LLE 37# 5 reps, RLE 37# 10 reps with verbal cues on technique      Knee/Hip Exercises: Standing   Other Standing Knee Exercises sit to / from stand from 24" bar stool without UE assist 5 reps with close supervision.       Prosthetics   Current prosthetic wear tolerance (days/week)  daily    Current prosthetic wear tolerance (#hours/day)  prosthesis most of awake hours drying 4x/day    Residual limb condition  No open areas,  frail looking skin, pale color, colder temperature, no hair growth (shaving PT advised against) clinderical shape                    PT Short Term Goals - 11/24/19 1258      PT SHORT TERM GOAL #1   Title Patient verbalizes understanding of adjusting ply socks with prosthesis wear. (All STGs Target Date: 11/28/2019)    Baseline MET 11/24/2019    Time 1    Period Months    Status Achieved    Target Date 11/28/19      PT SHORT TERM GOAL #2   Title Patient tolerates prosthesis wear >12 hrs total/ day without skin or limb pain issues.    Baseline MET 11/24/2019    Time 1    Period Months    Status Achieved    Target Date 11/28/19      PT SHORT TERM GOAL #3   Title Patient standing balance static with eyes open 20 seconds within 3 attempts and turns head right/left/up/down without UE support with supervision.    Time 1    Period Months    Status On-going    Target Date 11/28/19      PT SHORT TERM GOAL #4   Title Patient ambulates 200' with forearm crutches & prosthesis with supervision.    Baseline MET 11/24/2019    Time 1    Period Months    Status Achieved    Target Date 11/28/19      PT SHORT TERM GOAL #5   Title patient negotiates ramps & curbs with forearm crutches & prosthesis with supervision.    Time 1    Period Months    Status On-going    Target Date 11/28/19             PT Long Term Goals - 10/21/19 2211       PT LONG TERM GOAL #1   Title Patient verbalizes & demonstrates proper prosthetic care to enable safe utilization of prosthesis.  (All LTGs Target Date 12/19/2019)    Time 3    Period Months    Status On-going    Target Date 12/19/19      PT LONG TERM GOAL #2   Title Patient tolerates prosthesis wear & right ankle orthosis wear >90% of awake hours to enable function throughout her day.    Time 3    Period Months    Status On-going    Target Date 12/19/19      PT LONG TERM GOAL #3   Title Standing balance: static without UE support eyes open for 30 seconds and dynamic with UE support reaching 10", pick up object from floor, managing clothes & static eyes closed 2 minutes safely modified independent.    Time 3    Period Months    Status On-going    Target Date 12/19/19      PT LONG TERM GOAL #4   Title Patient ambulates 300' including grass with cane or less and prosthesis modified independent for community access.    Time 3    Period Months    Status On-going    Target Date 12/19/19      PT LONG TERM GOAL #5   Title Patient negotiates ramps & curbs with cane or less & prosthesis modified independent for community access.    Time 3    Period Months    Status On-going    Target Date 12/19/19  Plan - 11/24/19 1259    Clinical Impression Statement Patient met 3 of 5 STGs checked today. PT introduced using 24" bar stool as modified stand for ADLs like kitchen and sit/stand without UE assist for strength & balance.    Personal Factors and Comorbidities Comorbidity 3+;Fitness;Time since onset of injury/illness/exacerbation    Comorbidities L TTA, DM, CVA, sleep apnea, PNA, neuropathy bil. feet and hand, HTN, s/p cervical fusion, s/p back surgery, IBS, asthma, Covid in Jan 2021    Examination-Activity Limitations Hygiene/Grooming;Locomotion Level;Reach Overhead;Stairs;Stand;Transfers    Examination-Participation Restrictions Journalist, newspaper Evolving/Moderate complexity    Rehab Potential Good    PT Frequency 2x / week    PT Duration 12 weeks    PT Treatment/Interventions ADLs/Self Care Home Management;DME Instruction;Gait training;Stair training;Functional mobility training;Therapeutic activities;Therapeutic exercise;Balance training;Neuromuscular re-education;Patient/family education;Orthotic Fit/Training;Prosthetic Training;Vestibular    PT Next Visit Plan check remaining 2 STGs, prosthetic gait with forearm crutches community activities & single crutch household activities, therapuetic exercise for endurance & strength, standing balance with decreased UE support & facilitating upright posture    PT Home Exercise Plan Access Code: [email or web address]    at sink for mini squats with UE support, weight shifts with UE support, and balance feet apart no Ue support all with chair behind her    Consulted and Agree with Plan of Care Patient           Patient will benefit from skilled therapeutic intervention in order to improve the following deficits and impairments:  Abnormal gait, Decreased activity tolerance, Decreased balance, Decreased endurance, Decreased knowledge of use of DME, Decreased mobility, Decreased range of motion, Decreased safety awareness, Decreased strength, Increased edema, Impaired flexibility, Impaired sensation, Postural dysfunction, Prosthetic Dependency, Obesity, Pain  Visit Diagnosis: Unsteadiness on feet  Abnormal posture  Other abnormalities of gait and mobility  Weakness generalized  History of fall     Problem List Patient Active Problem List   Diagnosis Date Noted  . History of below knee amputation, left ([place]) 07/30/2019  . Gangrene of left foot ([place]) 05/16/2019  . Subacute osteomyelitis of left foot ([place])   . Charcot's joint of foot, left   . Post-operative state   . PAD (peripheral artery disease) ([place]) 12/05/2018  . Osteomyelitis of ankle or foot, acute, left  ([place]) 12/05/2018  . MRSA bacteremia 12/05/2018  . Sepsis without acute organ dysfunction ([place])   . Charcot's joint of foot due to diabetes ([place]) 09/18/2018  . BMI 36.0-36.9,adult   . Morbid obesity ([place]) 11/19/2017  . B12 deficiency 11/01/2017  . Diabetic foot ulcer ([place]) 12/14/2016  . Diabetic polyneuropathy associated with type 2 diabetes mellitus ([place]) 12/13/2015  . Lumbar stenosis with neurogenic claudication 09/02/2015  . Chronic constipation 07/13/2015  . OSA (obstructive sleep apnea) 06/25/2015  . 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/23/2015  . Hypertriglyceridemia 04/23/2015  . Asthma, mild intermittent 02/01/2015  . Type 2 diabetes mellitus with diabetic nephropathy ([place]) 12/31/2014  . Gastroesophageal reflux disease with esophagitis 12/31/2014  . Dysphagia 12/31/2014  . Bilateral carpal tunnel syndrome 12/01/2014  . Diverticulosis of colon (without mention of hemorrhage) 11/30/2012  . Essential hypertension, benign 11/28/2012    Jamey Reas PT, DPT 11/24/2019, 1:10 PM  [facility] [address] [place], [place], [address] Phone: [phone]   Fax:  [phone]  Name: MALIEA GRANDMAISON MRN: [id number] Date of Birth: 11/14/1955

## 2019-11-27 ENCOUNTER — Ambulatory Visit (INDEPENDENT_AMBULATORY_CARE_PROVIDER_SITE_OTHER): Payer: Medicare Other | Admitting: Physical Therapy

## 2019-11-27 ENCOUNTER — Other Ambulatory Visit: Payer: Self-pay

## 2019-11-27 ENCOUNTER — Ambulatory Visit: Payer: Medicare Other | Admitting: Family Medicine

## 2019-11-27 ENCOUNTER — Encounter: Payer: Self-pay | Admitting: Physical Therapy

## 2019-11-27 DIAGNOSIS — R531 Weakness: Secondary | ICD-10-CM

## 2019-11-27 DIAGNOSIS — M25662 Stiffness of left knee, not elsewhere classified: Secondary | ICD-10-CM

## 2019-11-27 DIAGNOSIS — R2689 Other abnormalities of gait and mobility: Secondary | ICD-10-CM | POA: Diagnosis not present

## 2019-11-27 DIAGNOSIS — R293 Abnormal posture: Secondary | ICD-10-CM

## 2019-11-27 DIAGNOSIS — R2681 Unsteadiness on feet: Secondary | ICD-10-CM

## 2019-11-27 NOTE — Therapy (Signed)
[facility] [address] [place], [place], [address] Phone: [phone]   Fax:  [phone]  Physical Therapy Treatment  Patient Details  Name: Olivia Romero MRN: [id number] Date of Birth: 04-Feb-1956 Referring Provider (PT): Meridee Score, MD   Encounter Date: 11/27/2019   PT End of Session - 11/27/19 1158    Visit Number 16    Number of Visits 25    Date for PT Re-Evaluation 12/21/19    Authorization Type [employer] Medicare, $30 co-pay, no visit limit    Progress Note Due on Visit 20    PT Start Time 1145    PT Stop Time 1230    PT Time Calculation (min) 45 min    Equipment Utilized During Treatment Gait belt    Activity Tolerance Patient tolerated treatment well    Behavior During Therapy [facility] for tasks assessed/performed           Past Medical History:  Diagnosis Date  . Anemia   . Arthritis   . Asthma   . Colon polyps    adenomatous  . Diabetes mellitus without complication ([place])   . Diabetic infection of left foot ([place]) 12/2018  . Diverticulosis of colon   . Esophagitis   . GERD (gastroesophageal reflux disease)   . Hemorrhoid    internal  . Hyperlipemia   . Hypertension   . IBS (irritable bowel syndrome)    no current prob - diet controlled  . Myalgia due to statin 11/19/2017  . Neuropathy   . Neuropathy of both feet   . Neuropathy of hand   . Pneumonia    x 4  . PONV (postoperative nausea and vomiting)    on some surgeries but not all procedures  . Skin ulcer of right ankle, limited to breakdown of skin ([place]) 12/31/2016   resolved per patient 05/14/19  . Sleep apnea    has had in the past lost 50 pounds and do longer uses cpap  . Statin intolerance 04/23/2015  . Stroke [facility]) 2009   mini stroke per patient  . Stroke-like episode 2009   TIA - mini stroke per patient  . 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 Uncontrolled type 2 diabetes mellitus with hyperglycemia ([place]) 11/01/2017     Past Surgical History:  Procedure Laterality Date  . ABDOMINAL HYSTERECTOMY    . AMPUTATION Left 12/06/2018   Procedure: PARTIAL AMPUTATION LEFT FOOT;  Surgeon: Edrick Kins, DPM;  Location: [place];  Service: Podiatry;  Laterality: Left;  . AMPUTATION Left 05/16/2019   Procedure: LEFT BELOW KNEE AMPUTATION;  Surgeon: Newt Minion, MD;  Location: [place];  Service: Orthopedics;  Laterality: Left;  . BACK SURGERY  09/02/2015  . BONE BIOPSY Left 12/06/2018   Procedure: Bone Biopsy;  Surgeon: Edrick Kins, DPM;  Location: [place];  Service: Podiatry;  Laterality: Left;  . CERVICAL FUSION    . CESAREAN SECTION  1986  . COLONOSCOPY N/A 11/30/2012   Procedure: COLONOSCOPY;  Surgeon: Irene Shipper, MD;  Location: [facility] ENDOSCOPY;  Service: Endoscopy;  Laterality: N/A;  . COLONOSCOPY WITH PROPOFOL N/A 01/03/2016   Procedure: COLONOSCOPY WITH PROPOFOL;  Surgeon: Manya Silvas, MD;  Location: [facility] ENDOSCOPY;  Service: Endoscopy;  Laterality: N/A;  . ESOPHAGOGASTRODUODENOSCOPY (EGD) WITH PROPOFOL N/A 01/14/2015   Procedure: ESOPHAGOGASTRODUODENOSCOPY (EGD) WITH PROPOFOL;  Surgeon: Manya Silvas, MD;  Location: [facility] ENDOSCOPY;  Service: Endoscopy;  Laterality: N/A;  . IRRIGATION AND DEBRIDEMENT FOOT Left 03/13/2018  Procedure: IRRIGATION AND DEBRIDEMENT FOOT WITH BONE BIOPSY WITH MISONIX DEBRIDER;  Surgeon: Evelina Bucy, DPM;  Location: [place];  Service: Podiatry;  Laterality: Left;  . IRRIGATION AND DEBRIDEMENT FOOT Left 12/06/2018   Procedure: Irrigation And Debridement Foot;  Surgeon: Edrick Kins, DPM;  Location: [place];  Service: Podiatry;  Laterality: Left;  . LEG AMPUTATION BELOW KNEE Left   . LUMBAR WOUND DEBRIDEMENT N/A 10/01/2015   Procedure: LUMBAR WOUND DEBRIDEMENT;  Surgeon: Ashok Pall, MD;  Location: [place] NEURO ORS;  Service: Neurosurgery;  Laterality: N/A;  LUMBAR WOUND DEBRIDEMENT  . NASAL SINUS SURGERY    . SAVORY DILATION N/A 01/14/2015   Procedure: SAVORY DILATION;  Surgeon: Manya Silvas, MD;  Location: [facility] ENDOSCOPY;  Service: Endoscopy;  Laterality: N/A;  . TONSILLECTOMY    . WISDOM TOOTH EXTRACTION      There were no vitals filed for this visit.   Subjective Assessment - 11/27/19 1145    Subjective She removes her prosthesis during day due to overheating. She sits with prosthesis off & no shrinker for 30 minutes    Pertinent History L TTA, DM, CVA, sleep apnea, PNA, neuropathy bil. 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 No/denies    Pain Onset In the past 7 days                             [facility] Adult PT Treatment/Exercise - 11/27/19 1145      Transfers   Transfers Sit to Stand;Stand to [employer] Transfers    Sit to Stand 5: Supervision;With upper extremity assist;With armrests;From chair/3-in-1   to forearm crutches   Sit to Stand Details Visual cues for safe use of DME/AE;Verbal cues for precautions/safety;Verbal cues for technique    Stand to Sit 5: Supervision;With upper extremity assist;With armrests;To chair/3-in-1   from forearm crutches   Stand to Sit Details (indicate cue type and reason) Visual cues for safe use of DME/AE;Verbal cues for precautions/safety;Verbal cues for technique      Ambulation/Gait   Ambulation/Gait Yes    Ambulation/Gait Assistance 4: Min guard;5: Supervision   min guard single crutch & supervision 2 crutches   Ambulation/Gait Assistance Details Verbal cues on reciprocal gait pattern with 2 crutches and not staring at floor for upright posture.  With single forearm crutch tactile & verbal cues on upright posture, sequence & step length / initial contact with prosthetic heel.  Carrying bottle of water in free hand.     Ambulation Distance  (Feet) 200 Feet   200' X 2 with bil. crutches, 50' X 2 with single crutch   Assistive device Prosthesis;Lofstrands;Rollator;R Forearm Crutch   enter/ exit with rollator   Gait Pattern Step-through pattern;Decreased arm swing - left;Decreased step length - right;Decreased stance time - left;Antalgic;Trunk flexed    Ramp 5: Supervision   forearm crutches & TTA prosthesis   Ramp Details (indicate cue type and reason) demo & verbal cues on sequence, wt shift, upright posture and step length.    Curb 4: Min assist   forearm crutches & prosthesis   Curb Details (indicate cue type and reason) demo & verbal cues on sequence & technique with forearm crutches, prosthesis &  AFO      Self-Care   ADL's --      Neuro Re-ed    Neuro Re-ed Details  standing without UE support with minA / manual cues for upright posture & balance BUEs red theraband rows & forward punch 10 reps ea.       Knee/Hip Exercises: Machines for Strengthening   Cybex Leg Press shuttle leg press BLEs 87# 15 reps, LLE 37# 5 reps, RLE 37# 10 reps with verbal cues on technique      Knee/Hip Exercises: Standing   Other Standing Knee Exercises sit to / from stand from 24" bar stool without UE assist 5 reps with close supervision.       Prosthetics   Prosthetic Care Comments  wear shrinker sock at all times not wearing prosthesis.     Current prosthetic wear tolerance (days/week)  daily    Current prosthetic wear tolerance (#hours/day)  prosthesis most of awake hours drying 4x/day    Residual limb condition  No open areas,  frail looking skin, pale color, colder temperature, no hair growth (shaving PT advised against) clinderical shape    Education Provided Residual limb care    Person(s) Educated Patient    Education Method Explanation;Verbal cues    Education Method Verbalized understanding;Verbal cues required;Needs further instruction    Donning Prosthesis Modified independent (device/increased time)    Doffing Prosthesis Modified  independent (device/increased time)                  PT Education - 11/27/19 1230    Education Details use of frozen water bottles under thigh / knee or armpits to cool off and continue use of antiperspirant    Person(s) Educated Patient    Methods Explanation;Demonstration;Verbal cues    Comprehension Verbalized understanding            PT Short Term Goals - 11/27/19 1239      PT SHORT TERM GOAL #1   Title Patient verbalizes understanding of adjusting ply socks with prosthesis wear. (All STGs Target Date: 11/28/2019)    Baseline MET 11/24/2019    Time 1    Period Months    Status Achieved    Target Date 11/28/19      PT SHORT TERM GOAL #2   Title Patient tolerates prosthesis wear >12 hrs total/ day without skin or limb pain issues.    Baseline MET 11/24/2019    Time 1    Period Months    Status Achieved    Target Date 11/28/19      PT SHORT TERM GOAL #3   Title Patient standing balance static with eyes open 20 seconds within 3 attempts and turns head right/left/up/down without UE support with supervision.    Baseline MET 11/27/2019    Time 1    Period Months    Status Achieved    Target Date 11/28/19      PT SHORT TERM GOAL #4   Title Patient ambulates 200' with forearm crutches & prosthesis with supervision.    Baseline MET 11/24/2019    Time 1    Period Months    Status Achieved    Target Date 11/28/19      PT SHORT TERM GOAL #5   Title patient negotiates ramps & curbs with forearm crutches & prosthesis with supervision.    Baseline MET 11/27/2019    Time 1    Period Months    Status Achieved    Target Date 11/28/19  PT Long Term Goals - 11/27/19 1307      PT LONG TERM GOAL #1   Title Patient verbalizes & demonstrates proper prosthetic care to enable safe utilization of prosthesis.  (All LTGs Target Date 12/19/2019)    Time 3    Period Months    Status On-going    Target Date 12/19/19      PT LONG TERM GOAL #2   Title Patient  tolerates prosthesis wear & right ankle orthosis wear >90% of awake hours to enable function throughout her day.    Time 3    Period Months    Status On-going    Target Date 12/19/19      PT LONG TERM GOAL #3   Title Standing balance: static without UE support eyes open for 30 seconds and dynamic with UE support reaching 10", pick up object from floor, managing clothes & static eyes closed 30 seconds safely modified independent.    Time 3    Period Months    Status On-going    Target Date 12/19/19      PT LONG TERM GOAL #4   Title Patient ambulates 300' including grass with 2 forearm crutches or rollator walker and prosthesisAFO modified independent for community access.    Time 3    Period Months    Status Revised    Target Date 12/19/19      PT LONG TERM GOAL #5   Title Patient negotiates ramps & curbs with 2 forearm crutches or rollator walker & prosthesis modified independent for community access.    Time 3    Period Months    Status Revised    Target Date 12/19/19      Additional Long Term Goals   Additional Long Term Goals Yes      PT LONG TERM GOAL #6   Title Patient ambulates 100' around furniture carrying item in free UE with single forearm crutch, prosthesis & AFO modified independent.    Time 4    Period Weeks    Status New    Target Date 12/19/19                 Plan - 11/27/19 1158    Clinical Impression Statement Patient met all STGs set for this 30-day period.  PT updated LTGs for community function with 2 forearm crutches or rollator walker and household with single forearm crutch.  Patient seems to understand PT recommendations to no sit with nothing on residual limb as fluid retention effects prosthesis fit. She like PT recommendation to use frozen water bottle under thigh or knee or armpits to cool off.  PT worked on carrying bottle of water with single crutch which surprised patient what she could do.    Personal Factors and Comorbidities  Comorbidity 3+;Fitness;Time since onset of injury/illness/exacerbation    Comorbidities L TTA, DM, CVA, sleep apnea, PNA, neuropathy bil. feet and hand, HTN, s/p cervical fusion, s/p back surgery, IBS, asthma, Covid in Jan 2021    Examination-Activity Limitations Hygiene/Grooming;Locomotion Level;Reach Overhead;Stairs;Stand;Transfers    Examination-Participation Restrictions Recruitment consultant Evolving/Moderate complexity    Rehab Potential Good    PT Frequency 2x / week    PT Duration 12 weeks    PT Treatment/Interventions ADLs/Self Care Home Management;DME Instruction;Gait training;Stair training;Functional mobility training;Therapeutic activities;Therapeutic exercise;Balance training;Neuromuscular re-education;Patient/family education;Orthotic Fit/Training;Prosthetic Training;Vestibular    PT Next Visit Plan work towards LTGs,  prosthetic gait with forearm crutches community activities & single crutch household activities,  therapuetic exercise for endurance & strength, standing balance with decreased UE support & facilitating upright posture    PT Home Exercise Plan Access Code: [email or web address]    at sink for mini squats with UE support, weight shifts with UE support, and balance feet apart no Ue support all with chair behind her    Consulted and Agree with Plan of Care Patient           Patient will benefit from skilled therapeutic intervention in order to improve the following deficits and impairments:  Abnormal gait, Decreased activity tolerance, Decreased balance, Decreased endurance, Decreased knowledge of use of DME, Decreased mobility, Decreased range of motion, Decreased safety awareness, Decreased strength, Increased edema, Impaired flexibility, Impaired sensation, Postural dysfunction, Prosthetic Dependency, Obesity, Pain  Visit Diagnosis: Other abnormalities of gait and mobility  Unsteadiness on feet  Abnormal posture  Weakness  generalized  Stiffness of left knee, not elsewhere classified     Problem List Patient Active Problem List   Diagnosis Date Noted  . History of below knee amputation, left ([place]) 07/30/2019  . Gangrene of left foot ([place]) 05/16/2019  . Subacute osteomyelitis of left foot ([place])   . Charcot's joint of foot, left   . Post-operative state   . PAD (peripheral artery disease) ([place]) 12/05/2018  . Osteomyelitis of ankle or foot, acute, left ([place]) 12/05/2018  . MRSA bacteremia 12/05/2018  . Sepsis without acute organ dysfunction ([place])   . Charcot's joint of foot due to diabetes ([place]) 09/18/2018  . BMI 36.0-36.9,adult   . Morbid obesity ([place]) 11/19/2017  . B12 deficiency 11/01/2017  . Diabetic foot ulcer ([place]) 12/14/2016  . Diabetic polyneuropathy associated with type 2 diabetes mellitus ([place]) 12/13/2015  . Lumbar stenosis with neurogenic claudication 09/02/2015  . Chronic constipation 07/13/2015  . OSA (obstructive sleep apnea) 06/25/2015  . 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/23/2015  . Hypertriglyceridemia 04/23/2015  . Asthma, mild intermittent 02/01/2015  . Type 2 diabetes mellitus with diabetic nephropathy ([place]) 12/31/2014  . Gastroesophageal reflux disease with esophagitis 12/31/2014  . Dysphagia 12/31/2014  . Bilateral carpal tunnel syndrome 12/01/2014  . Diverticulosis of colon (without mention of hemorrhage) 11/30/2012  . Essential hypertension, benign 11/28/2012    Jamey Reas PT, DPT 11/27/2019, 6:40 PM  [facility] [address] [place], [place], [address] Phone: [phone]   Fax:  [phone]  Name: DELANO SCARDINO MRN: [id number] Date of Birth: 01/16/1956

## 2019-12-01 ENCOUNTER — Ambulatory Visit (INDEPENDENT_AMBULATORY_CARE_PROVIDER_SITE_OTHER): Payer: Medicare Other | Admitting: Physical Therapy

## 2019-12-01 ENCOUNTER — Encounter: Payer: Self-pay | Admitting: Physical Therapy

## 2019-12-01 ENCOUNTER — Other Ambulatory Visit: Payer: Self-pay

## 2019-12-01 ENCOUNTER — Encounter: Payer: Self-pay | Admitting: Family Medicine

## 2019-12-01 DIAGNOSIS — R293 Abnormal posture: Secondary | ICD-10-CM

## 2019-12-01 DIAGNOSIS — R531 Weakness: Secondary | ICD-10-CM | POA: Diagnosis not present

## 2019-12-01 DIAGNOSIS — M25662 Stiffness of left knee, not elsewhere classified: Secondary | ICD-10-CM

## 2019-12-01 DIAGNOSIS — R2689 Other abnormalities of gait and mobility: Secondary | ICD-10-CM | POA: Diagnosis not present

## 2019-12-01 DIAGNOSIS — Z888 Allergy status to other drugs, medicaments and biological substances status: Secondary | ICD-10-CM | POA: Insufficient documentation

## 2019-12-01 DIAGNOSIS — R2681 Unsteadiness on feet: Secondary | ICD-10-CM

## 2019-12-01 DIAGNOSIS — Z9181 History of falling: Secondary | ICD-10-CM

## 2019-12-01 NOTE — Therapy (Signed)
[facility] Physical Therapy [address] [place], [place], [address] Phone: [phone]   Fax:  [phone]  Physical Therapy Treatment  Patient Details  Name: Olivia Romero MRN: [id number] Date of Birth: 03-06-1956 Referring Provider (PT): Meridee Score, MD   Encounter Date: 12/01/2019   PT End of Session - 12/01/19 1255    Visit Number 17    Number of Visits 25    Date for PT Re-Evaluation 12/21/19    Authorization Type [employer] Medicare, $30 co-pay, no visit limit    Progress Note Due on Visit 65    PT Start Time [place], PT started session for primary PT   PT Stop Time 1100    PT Time Calculation (min) 45 min    Equipment Utilized During Treatment Gait belt    Activity Tolerance Patient tolerated treatment well    Behavior During Therapy [facility] for tasks assessed/performed           Past Medical History:  Diagnosis Date  . Anemia   . Arthritis   . Asthma   . Colon polyps    adenomatous  . Diabetes mellitus without complication ([place])   . Diabetic infection of left foot ([place]) 12/2018  . Diverticulosis of colon   . Esophagitis   . GERD (gastroesophageal reflux disease)   . Hemorrhoid    internal  . Hyperlipemia   . Hypertension   . IBS (irritable bowel syndrome)    no current prob - diet controlled  . Myalgia due to statin 11/19/2017  . Neuropathy   . Neuropathy of both feet   . Neuropathy of hand   . Pneumonia    x 4  . PONV (postoperative nausea and vomiting)    on some surgeries but not all procedures  . Skin ulcer of right ankle, limited to breakdown of skin ([place]) 12/31/2016   resolved per patient 05/14/19  . Sleep apnea    has had in the past lost 50 pounds and do longer uses cpap  . Statin intolerance 04/23/2015  . Stroke [facility]) 2009   mini stroke per patient  . Stroke-like episode 2009   TIA - mini stroke per patient  . 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 ([place]) 07/13/2015  . Uncontrolled type 2  diabetes mellitus with hyperglycemia ([place]) 11/01/2017    Past Surgical History:  Procedure Laterality Date  . ABDOMINAL HYSTERECTOMY    . AMPUTATION Left 12/06/2018   Procedure: PARTIAL AMPUTATION LEFT FOOT;  Surgeon: Edrick Kins, DPM;  Location: [place];  Service: Podiatry;  Laterality: Left;  . AMPUTATION Left 05/16/2019   Procedure: LEFT BELOW KNEE AMPUTATION;  Surgeon: Newt Minion, MD;  Location: [place];  Service: Orthopedics;  Laterality: Left;  . BACK SURGERY  09/02/2015  . BONE BIOPSY Left 12/06/2018   Procedure: Bone Biopsy;  Surgeon: Edrick Kins, DPM;  Location: [place];  Service: Podiatry;  Laterality: Left;  . CERVICAL FUSION    . CESAREAN SECTION  1986  . COLONOSCOPY N/A 11/30/2012   Procedure: COLONOSCOPY;  Surgeon: Irene Shipper, MD;  Location: [facility] ENDOSCOPY;  Service: Endoscopy;  Laterality: N/A;  . COLONOSCOPY WITH PROPOFOL N/A 01/03/2016   Procedure: COLONOSCOPY WITH PROPOFOL;  Surgeon: Manya Silvas, MD;  Location: [facility] ENDOSCOPY;  Service: Endoscopy;  Laterality: N/A;  . ESOPHAGOGASTRODUODENOSCOPY (EGD) WITH PROPOFOL N/A 01/14/2015   Procedure: ESOPHAGOGASTRODUODENOSCOPY (EGD) WITH PROPOFOL;  Surgeon: Manya Silvas, MD;  [facility] ENDOSCOPY;  Service: Endoscopy;  Laterality:  N/A;  . IRRIGATION AND DEBRIDEMENT FOOT Left 03/13/2018   Procedure: IRRIGATION AND DEBRIDEMENT FOOT WITH BONE BIOPSY WITH MISONIX DEBRIDER;  Surgeon: Evelina Bucy, DPM;  Location: [place];  Service: Podiatry;  Laterality: Left;  . IRRIGATION AND DEBRIDEMENT FOOT Left 12/06/2018   Procedure: Irrigation And Debridement Foot;  Surgeon: Edrick Kins, DPM;  Location: [place];  Service: Podiatry;  Laterality: Left;  . LEG AMPUTATION BELOW KNEE Left   . LUMBAR WOUND DEBRIDEMENT N/A 10/01/2015   Procedure: LUMBAR WOUND DEBRIDEMENT;  Surgeon: Ashok Pall, MD;  Location: [place] [facility];  Service: Neurosurgery;  Laterality: N/A;  LUMBAR WOUND DEBRIDEMENT  . NASAL SINUS SURGERY    . SAVORY DILATION N/A  01/14/2015   Procedure: SAVORY DILATION;  Surgeon: Manya Silvas, MD;  Location: [facility] ENDOSCOPY;  Service: Endoscopy;  Laterality: N/A;  . TONSILLECTOMY    . WISDOM TOOTH EXTRACTION      There were no vitals filed for this visit.   Subjective Assessment - 12/01/19 1022    Subjective She says her Lt knee was sore and huring but on Friday night she realized it was because she had forgot to wear socks with her prosthesis. She now is wearing appropriate socks and that this feels much better. Denies pain upon arival today or any other issues with her prosthesis    Pertinent History L TTA, DM, CVA, sleep apnea, PNA, neuropathy bil. 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, in community    Pain Onset In the past 7 days                             [facility] Adult PT Treatment/Exercise - 12/01/19 1015      Transfers   Transfers Sit to Stand;Stand to [employer] Transfers    Sit to Stand 5: Supervision;With upper extremity assist;With armrests;From chair/3-in-1   to forearm crutches   Sit to Stand Details Visual cues for safe use of DME/AE;Verbal cues for precautions/safety;Verbal cues for technique    Stand to Sit 5: Supervision;With upper extremity assist;With armrests;To chair/3-in-1   from forearm crutches   Stand to Sit Details (indicate cue type and reason) Visual cues for safe use of DME/AE;Verbal cues for precautions/safety;Verbal cues for technique      Ambulation/Gait   Ambulation/Gait Yes    Ambulation/Gait Assistance 4: Min guard;5: Supervision   min guard single crutch & supervision 2 crutches   Ambulation/Gait Assistance Details Verbal cues on reciprocal gait pattern with 2 crutches and not staring at floor for upright posture.  With single  forearm crutch tactile & verbal cues on upright posture, sequence & step length / initial contact with prosthetic heel.  Carrying bottle of water in free hand.    Ambulation Distance (Feet) 200 Feet   200'  with bil. crutches, 50' with single crutch   Assistive device Prosthesis;Lofstrands;Rollator;R Forearm Crutch   enter/ exit with rollator   Gait Pattern Step-through pattern;Decreased arm swing - left;Decreased step length - right;Decreased stance time - left;Antalgic;Trunk flexed    Ambulation Surface Level;Indoor    Ramp 4: Min assist;5: Supervision   forearm crutches & TTA prosthesis   Ramp Details (indicate cue type and reason) demo & verbal cues on sequence, wt shift,  upright posture and step length.    Curb 5: Supervision   forearm crutches & prosthesis   Curb Details (indicate cue type and reason) demo & verbal cues on sequence & technique with forearm crutches, prosthesis & AFO      Knee/Hip Exercises: Machines for Strengthening   Cybex Leg Press shuttle leg press BLEs 87# 15 reps, LLE 37# 5 reps, RLE 37# 10 reps with verbal cues on technique      Prosthetics   Current prosthetic wear tolerance (days/week)  daily    Current prosthetic wear tolerance (#hours/day)  prosthesis most of awake hours drying 4x/day    Residual limb condition  No open areas,  frail looking skin, pale color, colder temperature, no hair growth (shaving PT advised against) clinderical shape                  PT Education - 12/01/19 1100    Education Details Need to keep drink & snack in her rollator walker for low blood glucose levels.  Glycemic Index to balance meal foods so digest at different times to balance blood glucose.    Person(s) Educated Patient    Methods Explanation;Verbal cues    Comprehension Verbalized understanding;Tactile cues required            PT Short Term Goals - 11/27/19 1239      PT SHORT TERM GOAL #1   Title Patient verbalizes understanding of adjusting ply socks with  prosthesis wear. (All STGs Target Date: 11/28/2019)    Baseline MET 11/24/2019    Time 1    Period Months    Status Achieved    Target Date 11/28/19      PT SHORT TERM GOAL #2   Title Patient tolerates prosthesis wear >12 hrs total/ day without skin or limb pain issues.    Baseline MET 11/24/2019    Time 1    Period Months    Status Achieved    Target Date 11/28/19      PT SHORT TERM GOAL #3   Title Patient standing balance static with eyes open 20 seconds within 3 attempts and turns head right/left/up/down without UE support with supervision.    Baseline MET 11/27/2019    Time 1    Period Months    Status Achieved    Target Date 11/28/19      PT SHORT TERM GOAL #4   Title Patient ambulates 200' with forearm crutches & prosthesis with supervision.    Baseline MET 11/24/2019    Time 1    Period Months    Status Achieved    Target Date 11/28/19      PT SHORT TERM GOAL #5   Title patient negotiates ramps & curbs with forearm crutches & prosthesis with supervision.    Baseline MET 11/27/2019    Time 1    Period Months    Status Achieved    Target Date 11/28/19             PT Long Term Goals - 11/27/19 1307      PT LONG TERM GOAL #1   Title Patient verbalizes & demonstrates proper prosthetic care to enable safe utilization of prosthesis.  (All LTGs Target Date 12/19/2019)    Time 3    Period Months    Status On-going    Target Date 12/19/19      PT LONG TERM GOAL #2   Title Patient tolerates prosthesis wear & right ankle orthosis wear >90% of awake hours  to enable function throughout her day.    Time 3    Period Months    Status On-going    Target Date 12/19/19      PT LONG TERM GOAL #3   Title Standing balance: static without UE support eyes open for 30 seconds and dynamic with UE support reaching 10", pick up object from floor, managing clothes & static eyes closed 30 seconds safely modified independent.    Time 3    Period Months    Status On-going    Target  Date 12/19/19      PT LONG TERM GOAL #4   Title Patient ambulates 300' including grass with 2 forearm crutches or rollator walker and prosthesisAFO modified independent for community access.    Time 3    Period Months    Status Revised    Target Date 12/19/19      PT LONG TERM GOAL #5   Title Patient negotiates ramps & curbs with 2 forearm crutches or rollator walker & prosthesis modified independent for community access.    Time 3    Period Months    Status Revised    Target Date 12/19/19      Additional Long Term Goals   Additional Long Term Goals Yes      PT LONG TERM GOAL #6   Title Patient ambulates 100' around furniture carrying item in free UE with single forearm crutch, prosthesis & AFO modified independent.    Time 4    Period Weeks    Status New    Target Date 12/19/19                 Plan - 12/01/19 1257    Clinical Impression Statement Patient had symptoms of low blood glucose as did not take her medicine or eat this morning because ride arrived early.  Pt improved her ability to negotiate ramps & curbs with forearm crutches.    Personal Factors and Comorbidities Comorbidity 3+;Fitness;Time since onset of injury/illness/exacerbation    Comorbidities L TTA, DM, CVA, sleep apnea, PNA, neuropathy bil. feet and hand, HTN, s/p cervical fusion, s/p back surgery, IBS, asthma, Covid in Jan 2021    Examination-Activity Limitations Hygiene/Grooming;Locomotion Level;Reach Overhead;Stairs;Stand;Transfers    Examination-Participation Restrictions Recruitment consultant Evolving/Moderate complexity    Rehab Potential Good    PT Frequency 2x / week    PT Duration 12 weeks    PT Treatment/Interventions ADLs/Self Care Home Management;DME Instruction;Gait training;Stair training;Functional mobility training;Therapeutic activities;Therapeutic exercise;Balance training;Neuromuscular re-education;Patient/family education;Orthotic  Fit/Training;Prosthetic Training;Vestibular    PT Next Visit Plan work towards LTGs,  prosthetic gait with forearm crutches community activities & single crutch household activities, therapuetic exercise for endurance & strength, standing balance with decreased UE support & facilitating upright posture    PT Home Exercise Plan Access Code: [email or web address]    at sink for mini squats with UE support, weight shifts with UE support, and balance feet apart no Ue support all with chair behind her    Consulted and Agree with Plan of Care Patient           Patient will benefit from skilled therapeutic intervention in order to improve the following deficits and impairments:  Abnormal gait, Decreased activity tolerance, Decreased balance, Decreased endurance, Decreased knowledge of use of DME, Decreased mobility, Decreased range of motion, Decreased safety awareness, Decreased strength, Increased edema, Impaired flexibility, Impaired sensation, Postural dysfunction, Prosthetic Dependency, Obesity, Pain  Visit Diagnosis: Other abnormalities of gait and  mobility  Unsteadiness on feet  Abnormal posture  Weakness generalized  Stiffness of left knee, not elsewhere classified  History of fall     Problem List Patient Active Problem List   Diagnosis Date Noted  . Allergy to statin medication 12/01/2019  . History of below knee amputation, left ([place]) 07/30/2019  . Gangrene of left foot ([place]) 05/16/2019  . Subacute osteomyelitis of left foot ([place])   . Charcot's joint of foot, left   . Post-operative state   . PAD (peripheral artery disease) ([place]) 12/05/2018  . Osteomyelitis of ankle or foot, acute, left ([place]) 12/05/2018  . MRSA bacteremia 12/05/2018  . Sepsis without acute organ dysfunction ([place])   . Charcot's joint of foot due to diabetes ([place]) 09/18/2018  . BMI 36.0-36.9,adult   . Morbid obesity ([place]) 11/19/2017  . B12 deficiency 11/01/2017  . Diabetic foot ulcer ([place]) 12/14/2016  . Diabetic  polyneuropathy associated with type 2 diabetes mellitus ([place]) 12/13/2015  . Lumbar stenosis with neurogenic claudication 09/02/2015  . Chronic constipation 07/13/2015  . OSA (obstructive sleep apnea) 06/25/2015  . 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/23/2015  . Hypertriglyceridemia 04/23/2015  . Asthma, mild intermittent 02/01/2015  . Type 2 diabetes mellitus with diabetic nephropathy ([place]) 12/31/2014  . Gastroesophageal reflux disease with esophagitis 12/31/2014  . Dysphagia 12/31/2014  . Bilateral carpal tunnel syndrome 12/01/2014  . Diverticulosis of colon (without mention of hemorrhage) 11/30/2012  . Essential hypertension, benign 11/28/2012    Jamey Reas PT, DPT 12/01/2019, 9:29 PM  [facility] Physical Therapy [address] [place], [place], [address] Phone: [phone]   Fax:  [phone]  Name: Olivia Romero MRN: [id number] Date of Birth: May 08, 1956

## 2019-12-04 ENCOUNTER — Ambulatory Visit (INDEPENDENT_AMBULATORY_CARE_PROVIDER_SITE_OTHER): Payer: Medicare Other | Admitting: Physical Therapy

## 2019-12-04 ENCOUNTER — Other Ambulatory Visit: Payer: Self-pay

## 2019-12-04 ENCOUNTER — Encounter: Payer: Self-pay | Admitting: Physical Therapy

## 2019-12-04 DIAGNOSIS — R293 Abnormal posture: Secondary | ICD-10-CM

## 2019-12-04 DIAGNOSIS — M6281 Muscle weakness (generalized): Secondary | ICD-10-CM

## 2019-12-04 DIAGNOSIS — Z9181 History of falling: Secondary | ICD-10-CM | POA: Diagnosis not present

## 2019-12-04 DIAGNOSIS — R2681 Unsteadiness on feet: Secondary | ICD-10-CM

## 2019-12-04 DIAGNOSIS — R2689 Other abnormalities of gait and mobility: Secondary | ICD-10-CM

## 2019-12-04 DIAGNOSIS — M25662 Stiffness of left knee, not elsewhere classified: Secondary | ICD-10-CM

## 2019-12-04 NOTE — Therapy (Signed)
[facility] Physical Therapy [address] [place], [place], [address] Phone: [phone]   Fax:  [phone]  Physical Therapy Treatment  Patient Details  Name: Olivia Romero MRN: [id number] Date of Birth: 01-09-1956 Referring Provider (PT): Meridee Score, MD   Encounter Date: 12/04/2019   PT End of Session - 12/04/19 1059    Visit Number 18    Number of Visits 25    Date for PT Re-Evaluation 12/21/19    Authorization Type [employer] Medicare, $30 co-pay, no visit limit    Progress Note Due on Visit 20    PT Start Time 1100    Equipment Utilized During Treatment Gait belt    Activity Tolerance Patient tolerated treatment well    Behavior During Therapy [facility] for tasks assessed/performed           Past Medical History:  Diagnosis Date  . Anemia   . Arthritis   . Asthma   . Colon polyps    adenomatous  . Diabetes mellitus without complication ([place])   . Diabetic infection of left foot ([place]) 12/2018  . Diverticulosis of colon   . Esophagitis   . GERD (gastroesophageal reflux disease)   . Hemorrhoid    internal  . Hyperlipemia   . Hypertension   . IBS (irritable bowel syndrome)    no current prob - diet controlled  . Myalgia due to statin 11/19/2017  . Neuropathy   . Neuropathy of both feet   . Neuropathy of hand   . Pneumonia    x 4  . PONV (postoperative nausea and vomiting)    on some surgeries but not all procedures  . Skin ulcer of right ankle, limited to breakdown of skin ([place] [address]) 12/31/2016   resolved per patient 05/14/19  . Sleep apnea    has had in the past lost 50 pounds and do longer uses cpap  . Statin intolerance 04/23/2015  . Stroke [facility]) 2009   mini stroke per patient  . Stroke-like episode 2009   TIA - mini stroke per patient  . 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 ([place]) 07/13/2015  . Uncontrolled type 2 diabetes mellitus with hyperglycemia ([place]) 11/01/2017    Past Surgical History:  Procedure Laterality Date  .  ABDOMINAL HYSTERECTOMY    . AMPUTATION Left 12/06/2018   Procedure: PARTIAL AMPUTATION LEFT FOOT;  Surgeon: Edrick Kins, DPM;  Location: [place];  Service: Podiatry;  Laterality: Left;  . AMPUTATION Left 05/16/2019   Procedure: LEFT BELOW KNEE AMPUTATION;  Surgeon: Newt Minion, MD;  Location: [place];  Service: Orthopedics;  Laterality: Left;  . BACK SURGERY  09/02/2015  . BONE BIOPSY Left 12/06/2018   Procedure: Bone Biopsy;  Surgeon: Edrick Kins, DPM;  Location: [place];  Service: Podiatry;  Laterality: Left;  . CERVICAL FUSION    . CESAREAN SECTION  1986  . COLONOSCOPY N/A 11/30/2012   Procedure: COLONOSCOPY;  Surgeon: Irene Shipper, MD;  Location: [facility] ENDOSCOPY;  Service: Endoscopy;  Laterality: N/A;  . COLONOSCOPY WITH PROPOFOL N/A 01/03/2016   Procedure: COLONOSCOPY WITH PROPOFOL;  Surgeon: Manya Silvas, MD;  Location: [facility] ENDOSCOPY;  Service: Endoscopy;  Laterality: N/A;  . ESOPHAGOGASTRODUODENOSCOPY (EGD) WITH PROPOFOL N/A 01/14/2015   Procedure: ESOPHAGOGASTRODUODENOSCOPY (EGD) WITH PROPOFOL;  Surgeon: Manya Silvas, MD;  Location: [facility] ENDOSCOPY;  Service: Endoscopy;  Laterality: N/A;  . IRRIGATION AND DEBRIDEMENT FOOT Left 03/13/2018   Procedure: IRRIGATION AND DEBRIDEMENT FOOT WITH BONE BIOPSY WITH MISONIX DEBRIDER;  Surgeon: March Rummage,  Christian Mate, DPM;  Location: [place];  Service: Podiatry;  Laterality: Left;  . IRRIGATION AND DEBRIDEMENT FOOT Left 12/06/2018   Procedure: Irrigation And Debridement Foot;  Surgeon: Edrick Kins, DPM;  Location: [place];  Service: Podiatry;  Laterality: Left;  . LEG AMPUTATION BELOW KNEE Left   . LUMBAR WOUND DEBRIDEMENT N/A 10/01/2015   Procedure: LUMBAR WOUND DEBRIDEMENT;  Surgeon: Ashok Pall, MD;  Location: [place] NEURO ORS;  Service: Neurosurgery;  Laterality: N/A;  LUMBAR WOUND DEBRIDEMENT  . NASAL SINUS SURGERY    . SAVORY DILATION N/A 01/14/2015   Procedure: SAVORY DILATION;  Surgeon: Manya Silvas, MD;  Location: [facility] ENDOSCOPY;  Service:  Endoscopy;  Laterality: N/A;  . TONSILLECTOMY    . WISDOM TOOTH EXTRACTION      There were no vitals filed for this visit.   Subjective Assessment - 12/04/19 1059    Subjective Her knee has been hurting at times with prosthesis pushing on wound at knee cap.    Pertinent History L TTA, DM, CVA, sleep apnea, PNA, neuropathy bil. 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 No/denies    Pain Onset In the past 7 days                             [facility] Adult PT Treatment/Exercise - 12/04/19 1100      Transfers   Transfers Sit to Stand;Stand to [employer] Transfers    Sit to Stand 5: Supervision;With upper extremity assist;With armrests;From chair/3-in-1   to forearm crutches   Sit to Stand Details Visual cues for safe use of DME/AE;Verbal cues for precautions/safety;Verbal cues for technique    Stand to Sit 5: Supervision;With upper extremity assist;With armrests;To chair/3-in-1   from forearm crutches   Stand to Sit Details (indicate cue type and reason) Visual cues for safe use of DME/AE;Verbal cues for precautions/safety;Verbal cues for technique      Ambulation/Gait   Ambulation/Gait Yes    Ambulation/Gait Assistance 5: Supervision    Ambulation Distance (Feet) 100 Feet   100' X 2   Assistive device Prosthesis;Lofstrands;Rollator   enter/ exit with rollator   Gait Pattern Step-through pattern;Decreased arm swing - left;Decreased step length - right;Decreased stance time - left;Antalgic;Trunk flexed    Ambulation Surface Level;Indoor    Ramp 5: Supervision   forearm crutches & TTA prosthesis   Ramp Details (indicate cue type and reason) demo & verbal cues on sequence, wt shift, upright posture and step length.     Curb 5: Supervision   forearm crutches & prosthesis   Curb Details (indicate cue type and reason) demo & verbal cues on sequence & technique with forearm crutches, prosthesis & AFO      Knee/Hip Exercises: Aerobic   Nustep level 5 with BUEs & BLEs 15 minutes PT cues on technique & full motion    PT provided patient education while patient using Nustep.       Knee/Hip Exercises: Machines for Strengthening   Cybex Leg Press shuttle leg press BLEs 87# 15 reps, LLE 37# 5 reps, RLE 37# 10 reps with verbal cues on technique      Prosthetics   Prosthetic Care Comments  use of Tegaderm  for wounds / skin issues within area of socket & bandaid if above socket area.  PT donned Tegaderm to demo for patient how to apply but could use bandaid for wound on patella as above socket trim line.  PT instructed that patient can pump fluid out of limb after standing & walking in mornings or increases in her normal activity level.  She may need to increase ply socks 30-60 minutes after arising. If feeling pressure on knee cap not tendon below knee, this is indication she needs to add a ply sock.  Also have prosthetist check alignment while ambulating with crutches and check if shrinker is correct size / compression.      Current prosthetic wear tolerance (days/week)  daily    Current prosthetic wear tolerance (#hours/day)  prosthesis most of awake hours drying 4x/day    Residual limb condition  patella wound with redness perimeter, distal limb intact with no issues.     Education Provided Residual limb care;Correct ply sock adjustment;Other (comment)   see prosthetic care comments   Person(s) Educated Patient    Education Method Explanation;Verbal cues    Education Method Verbalized understanding;Verbal cues required;Needs further instruction    Donning Prosthesis Modified independent (device/increased time)    Doffing Prosthesis Modified independent (device/increased time)                  PT Education  - 12/04/19 1140    Education Details choosing Assistive device 1) single crutch for short distances like household only to enable free hand to carry items.  2) forearm crutches for uneven terrain like gravel or grass  3) rollator if longer distances with need for rests like shopping at craft fair.   Recommendation for aluminum forearm crutches as lighter.  Use of cooling cloth when outside in heat.    Person(s) Educated Patient    Methods Explanation;Verbal cues    Comprehension Verbalized understanding            PT Short Term Goals - 11/27/19 1239      PT SHORT TERM GOAL #1   Title Patient verbalizes understanding of adjusting ply socks with prosthesis wear. (All STGs Target Date: 11/28/2019)    Baseline MET 11/24/2019    Time 1    Period Months    Status Achieved    Target Date 11/28/19      PT SHORT TERM GOAL #2   Title Patient tolerates prosthesis wear >12 hrs total/ day without skin or limb pain issues.    Baseline MET 11/24/2019    Time 1    Period Months    Status Achieved    Target Date 11/28/19      PT SHORT TERM GOAL #3   Title Patient standing balance static with eyes open 20 seconds within 3 attempts and turns head right/left/up/down without UE support with supervision.    Baseline MET 11/27/2019    Time 1    Period Months    Status Achieved    Target Date 11/28/19      PT SHORT TERM GOAL #4   Title Patient ambulates 200' with forearm crutches & prosthesis with supervision.    Baseline MET 11/24/2019    Time 1    Period Months    Status Achieved    Target Date 11/28/19      PT SHORT TERM GOAL #5   Title patient negotiates ramps & curbs with forearm crutches & prosthesis with supervision.    Baseline MET 11/27/2019  Time 1    Period Months    Status Achieved    Target Date 11/28/19             PT Long Term Goals - 11/27/19 1307      PT LONG TERM GOAL #1   Title Patient verbalizes & demonstrates proper prosthetic care to enable safe utilization of  prosthesis.  (All LTGs Target Date 12/19/2019)    Time 3    Period Months    Status On-going    Target Date 12/19/19      PT LONG TERM GOAL #2   Title Patient tolerates prosthesis wear & right ankle orthosis wear >90% of awake hours to enable function throughout her day.    Time 3    Period Months    Status On-going    Target Date 12/19/19      PT LONG TERM GOAL #3   Title Standing balance: static without UE support eyes open for 30 seconds and dynamic with UE support reaching 10", pick up object from floor, managing clothes & static eyes closed 30 seconds safely modified independent.    Time 3    Period Months    Status On-going    Target Date 12/19/19      PT LONG TERM GOAL #4   Title Patient ambulates 300' including grass with 2 forearm crutches or rollator walker and prosthesisAFO modified independent for community access.    Time 3    Period Months    Status Revised    Target Date 12/19/19      PT LONG TERM GOAL #5   Title Patient negotiates ramps & curbs with 2 forearm crutches or rollator walker & prosthesis modified independent for community access.    Time 3    Period Months    Status Revised    Target Date 12/19/19      Additional Long Term Goals   Additional Long Term Goals Yes      PT LONG TERM GOAL #6   Title Patient ambulates 100' around furniture carrying item in free UE with single forearm crutch, prosthesis & AFO modified independent.    Time 4    Period Weeks    Status New    Target Date 12/19/19                 Plan - 12/04/19 1059    Clinical Impression Statement PT educated patient on choosing which assistive device to use in different circumstances and she appears to understand.  Patient is on target to meet LTGs in next two weeks.    Personal Factors and Comorbidities Comorbidity 3+;Fitness;Time since onset of injury/illness/exacerbation    Comorbidities L TTA, DM, CVA, sleep apnea, PNA, neuropathy bil. feet and hand, HTN, s/p cervical  fusion, s/p back surgery, IBS, asthma, Covid in Jan 2021    Examination-Activity Limitations Hygiene/Grooming;Locomotion Level;Reach Overhead;Stairs;Stand;Transfers    Examination-Participation Restrictions Recruitment consultant Evolving/Moderate complexity    Rehab Potential Good    PT Frequency 2x / week    PT Duration 12 weeks    PT Treatment/Interventions ADLs/Self Care Home Management;DME Instruction;Gait training;Stair training;Functional mobility training;Therapeutic activities;Therapeutic exercise;Balance training;Neuromuscular re-education;Patient/family education;Orthotic Fit/Training;Prosthetic Training;Vestibular    PT Next Visit Plan work towards [place],  prosthetic gait with forearm crutches community activities & single crutch household activities, therapuetic exercise for endurance & strength, standing balance with decreased UE support & facilitating upright posture    PT Home Exercise Plan Access Code: [email or web address]  at sink for mini squats with UE support, weight shifts with UE support, and balance feet apart no Ue support all with chair behind her    Consulted and Agree with Plan of Care Patient           Patient will benefit from skilled therapeutic intervention in order to improve the following deficits and impairments:  Abnormal gait, Decreased activity tolerance, Decreased balance, Decreased endurance, Decreased knowledge of use of DME, Decreased mobility, Decreased range of motion, Decreased safety awareness, Decreased strength, Increased edema, Impaired flexibility, Impaired sensation, Postural dysfunction, Prosthetic Dependency, Obesity, Pain  Visit Diagnosis: Other abnormalities of gait and mobility  Unsteadiness on feet  Abnormal posture  History of fall  Stiffness of left knee, not elsewhere classified  Muscle weakness (generalized)     Problem List Patient Active Problem List   Diagnosis Date Noted  . Allergy  to statin medication 12/01/2019  . History of below knee amputation, left ([place]) 07/30/2019  . Gangrene of left foot ([place]) 05/16/2019  . Subacute osteomyelitis of left foot ([place])   . Charcot's joint of foot, left   . Post-operative state   . PAD (peripheral artery disease) ([place]) 12/05/2018  . Osteomyelitis of ankle or foot, acute, left ([place]) 12/05/2018  . MRSA bacteremia 12/05/2018  . Sepsis without acute organ dysfunction ([place])   . Charcot's joint of foot due to diabetes ([place]) 09/18/2018  . BMI 36.0-36.9,adult   . Morbid obesity ([place]) 11/19/2017  . B12 deficiency 11/01/2017  . Diabetic foot ulcer ([place]) 12/14/2016  . Diabetic polyneuropathy associated with type 2 diabetes mellitus ([place]) 12/13/2015  . Lumbar stenosis with neurogenic claudication 09/02/2015  . Chronic constipation 07/13/2015  . OSA (obstructive sleep apnea) 06/25/2015  . 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/23/2015  . Hypertriglyceridemia 04/23/2015  . Asthma, mild intermittent 02/01/2015  . Type 2 diabetes mellitus with diabetic nephropathy ([place]) 12/31/2014  . Gastroesophageal reflux disease with esophagitis 12/31/2014  . Dysphagia 12/31/2014  . Bilateral carpal tunnel syndrome 12/01/2014  . Diverticulosis of colon (without mention of hemorrhage) 11/30/2012  . Essential hypertension, benign 11/28/2012    Jamey Reas PT, DPT 12/04/2019, 2:10 PM  [facility] Physical Therapy [address] [place], [place], [address] Phone: [phone]   Fax:  [phone]  Name: MYKAL KIRCHMAN MRN: [id number] Date of Birth: [birth year]

## 2019-12-09 ENCOUNTER — Ambulatory Visit (INDEPENDENT_AMBULATORY_CARE_PROVIDER_SITE_OTHER): Payer: Medicare Other | Admitting: Physical Therapy

## 2019-12-09 ENCOUNTER — Encounter: Payer: Self-pay | Admitting: Physical Therapy

## 2019-12-09 ENCOUNTER — Other Ambulatory Visit: Payer: Self-pay

## 2019-12-09 DIAGNOSIS — R2681 Unsteadiness on feet: Secondary | ICD-10-CM

## 2019-12-09 DIAGNOSIS — Z9181 History of falling: Secondary | ICD-10-CM

## 2019-12-09 DIAGNOSIS — R293 Abnormal posture: Secondary | ICD-10-CM

## 2019-12-09 DIAGNOSIS — R2689 Other abnormalities of gait and mobility: Secondary | ICD-10-CM

## 2019-12-09 DIAGNOSIS — M6281 Muscle weakness (generalized): Secondary | ICD-10-CM

## 2019-12-09 DIAGNOSIS — M25662 Stiffness of left knee, not elsewhere classified: Secondary | ICD-10-CM

## 2019-12-09 NOTE — Therapy (Signed)
[facility] [address] [place], [place], [address] Phone: [phone]   Fax:  [phone]  Physical Therapy Treatment  Patient Details  Name: Olivia Romero MRN: [id number] Date of Birth: August 12, 1955 Referring Provider (PT): Meridee Score, MD   Encounter Date: 12/09/2019   PT End of Session - 12/09/19 1101    Visit Number 19    Number of Visits 25    Date for PT Re-Evaluation 12/21/19    Authorization Type [employer] Medicare, $30 co-pay, no visit limit    Progress Note Due on Visit 20    PT Start Time 1100    PT Stop Time 1145    PT Time Calculation (min) 45 min    Equipment Utilized During Treatment Gait belt    Activity Tolerance Patient tolerated treatment well    Behavior During Therapy [facility] for tasks assessed/performed           Past Medical History:  Diagnosis Date  . Anemia   . Arthritis   . Asthma   . Colon polyps    adenomatous  . Diabetes mellitus without complication ([place])   . Diabetic infection of left foot ([place]) 12/2018  . Diverticulosis of colon   . Esophagitis   . GERD (gastroesophageal reflux disease)   . Hemorrhoid    internal  . Hyperlipemia   . Hypertension   . IBS (irritable bowel syndrome)    no current prob - diet controlled  . Myalgia due to statin 11/19/2017  . Neuropathy   . Neuropathy of both feet   . Neuropathy of hand   . Pneumonia    x 4  . PONV (postoperative nausea and vomiting)    on some surgeries but not all procedures  . Skin ulcer of right ankle, limited to breakdown of skin ([place]) 12/31/2016   resolved per patient 05/14/19  . Sleep apnea    has had in the past lost 50 pounds and do longer uses cpap  . Statin intolerance 04/23/2015  . Stroke [facility]) 2009   mini stroke per patient  . Stroke-like episode 2009   TIA - mini stroke per patient  . 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 ([place]) 07/13/2015  . Uncontrolled type 2 diabetes mellitus with hyperglycemia ([place]) 11/01/2017     Past Surgical History:  Procedure Laterality Date  . ABDOMINAL HYSTERECTOMY    . AMPUTATION Left 12/06/2018   Procedure: PARTIAL AMPUTATION LEFT FOOT;  Surgeon: Edrick Kins, DPM;  Location: [place];  Service: Podiatry;  Laterality: Left;  . AMPUTATION Left 05/16/2019   Procedure: LEFT BELOW KNEE AMPUTATION;  Surgeon: Newt Minion, MD;  Location: [place];  Service: Orthopedics;  Laterality: Left;  . BACK SURGERY  09/02/2015  . BONE BIOPSY Left 12/06/2018   Procedure: Bone Biopsy;  Surgeon: Edrick Kins, DPM;  Location: [place];  Service: Podiatry;  Laterality: Left;  . CERVICAL FUSION    . CESAREAN SECTION  1986  . COLONOSCOPY N/A 11/30/2012   Procedure: COLONOSCOPY;  Surgeon: Irene Shipper, MD;  Location: [facility] ENDOSCOPY;  Service: Endoscopy;  Laterality: N/A;  . COLONOSCOPY WITH PROPOFOL N/A 01/03/2016   Procedure: COLONOSCOPY WITH PROPOFOL;  Surgeon: Manya Silvas, MD;  Location: [facility] ENDOSCOPY;  Service: Endoscopy;  Laterality: N/A;  . ESOPHAGOGASTRODUODENOSCOPY (EGD) WITH PROPOFOL N/A 01/14/2015   Procedure: ESOPHAGOGASTRODUODENOSCOPY (EGD) WITH PROPOFOL;  Surgeon: Manya Silvas, MD;  Location: [facility] ENDOSCOPY;  Service: Endoscopy;  Laterality: N/A;  . IRRIGATION AND DEBRIDEMENT FOOT Left 03/13/2018  Procedure: IRRIGATION AND DEBRIDEMENT FOOT WITH BONE BIOPSY WITH MISONIX DEBRIDER;  Surgeon: Evelina Bucy, DPM;  Location: [place];  Service: Podiatry;  Laterality: Left;  . IRRIGATION AND DEBRIDEMENT FOOT Left 12/06/2018   Procedure: Irrigation And Debridement Foot;  Surgeon: Edrick Kins, DPM;  Location: [place];  Service: Podiatry;  Laterality: Left;  . LEG AMPUTATION BELOW KNEE Left   . LUMBAR WOUND DEBRIDEMENT N/A 10/01/2015   Procedure: LUMBAR WOUND DEBRIDEMENT;  Surgeon: Ashok Pall, MD;  Location: [place] [facility];  Service: Neurosurgery;  Laterality: N/A;  LUMBAR WOUND DEBRIDEMENT  . NASAL SINUS SURGERY    . SAVORY DILATION N/A 01/14/2015   Procedure: SAVORY DILATION;  Surgeon: Manya Silvas, MD;  Location: [facility] ENDOSCOPY;  Service: Endoscopy;  Laterality: N/A;  . TONSILLECTOMY    . WISDOM TOOTH EXTRACTION      There were no vitals filed for this visit.   Subjective Assessment - 12/09/19 1100    Subjective She has been using the bandaid on knee wound. She has appt with prosthetist on Thursday after PT.    Pertinent History L TTA, DM, CVA, sleep apnea, PNA, neuropathy bil. 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 No/denies    Pain Onset In the past 7 days                             [facility] Adult PT Treatment/Exercise - 12/09/19 1100      Transfers   Transfers Sit to Stand;Stand to [employer] Transfers    Sit to Stand 5: Supervision;With upper extremity assist;With armrests;From chair/3-in-1   to forearm crutches   Sit to Stand Details Visual cues for safe use of DME/AE;Verbal cues for precautions/safety;Verbal cues for technique    Stand to Sit 5: Supervision;With upper extremity assist;With armrests;To chair/3-in-1   from forearm crutches   Stand to Sit Details (indicate cue type and reason) Visual cues for safe use of DME/AE;Verbal cues for precautions/safety;Verbal cues for technique      Ambulation/Gait   Ambulation/Gait Yes    Ambulation/Gait Assistance 5: Supervision;4: Min assist   MinA single crutch, supervision 2 crutches   Ambulation/Gait Assistance Details tactile & verbal cues on upright posture, sequencing and balance reactions.      Ambulation Distance (Feet) 150 Feet   150' 2 forearm crutches; 25' & 40' single crutch   Assistive device Prosthesis;Lofstrands;Rollator;L Forearm Crutch   enter/ exit with rollator   Gait Pattern Step-through pattern;Decreased arm swing -  left;Decreased step length - right;Decreased stance time - left;Antalgic;Trunk flexed    Ambulation Surface Level;Indoor    Ramp 5: Supervision   forearm crutches & prosthesis   Ramp Details (indicate cue type and reason) demo & verbal cues on sequence, wt shift, upright posture and step length.    Curb 5: Supervision   forearm crutches & prosthesis   Curb Details (indicate cue type and reason) verbal & tactile cues on sequence & technique      Neuro Re-ed    Neuro Re-ed Details  standing without UE support for 62 seconds with supervision.  Standing with single UE support on rollator walker reaching 7" anteriorly, picking up items from floor with cues  on technique and static stance with eyes closed.  standing at sink with single forearm crutches reaching to back of sink with intermittent touch on sink to stabilize.        Knee/Hip Exercises: Aerobic   Nustep --      Knee/Hip Exercises: Machines for Strengthening   Cybex Leg Press --      Prosthetics   Prosthetic Care Comments  bandaid over wound on patella.     Current prosthetic wear tolerance (days/week)  daily    Current prosthetic wear tolerance (#hours/day)  prosthesis most of awake hours drying 4x/day    Residual limb condition  patella wound healed with callous granulation.      Education Provided Residual limb care;Correct ply sock adjustment;Other (comment)   see prosthetic care comments   Person(s) Educated Patient    Education Method Explanation;Verbal cues    Education Method Verbalized understanding;Verbal cues required;Needs further instruction                    PT Short Term Goals - 11/27/19 1239      PT SHORT TERM GOAL #1   Title Patient verbalizes understanding of adjusting ply socks with prosthesis wear. (All STGs Target Date: 11/28/2019)    Baseline MET 11/24/2019    Time 1    Period Months    Status Achieved    Target Date 11/28/19      PT SHORT TERM GOAL #2   Title Patient tolerates prosthesis wear  >12 hrs total/ day without skin or limb pain issues.    Baseline MET 11/24/2019    Time 1    Period Months    Status Achieved    Target Date 11/28/19      PT SHORT TERM GOAL #3   Title Patient standing balance static with eyes open 20 seconds within 3 attempts and turns head right/left/up/down without UE support with supervision.    Baseline MET 11/27/2019    Time 1    Period Months    Status Achieved    Target Date 11/28/19      PT SHORT TERM GOAL #4   Title Patient ambulates 200' with forearm crutches & prosthesis with supervision.    Baseline MET 11/24/2019    Time 1    Period Months    Status Achieved    Target Date 11/28/19      PT SHORT TERM GOAL #5   Title patient negotiates ramps & curbs with forearm crutches & prosthesis with supervision.    Baseline MET 11/27/2019    Time 1    Period Months    Status Achieved    Target Date 11/28/19             PT Long Term Goals - 11/27/19 1307      PT LONG TERM GOAL #1   Title Patient verbalizes & demonstrates proper prosthetic care to enable safe utilization of prosthesis.  (All LTGs Target Date 12/19/2019)    Time 3    Period Months    Status On-going    Target Date 12/19/19      PT LONG TERM GOAL #2   Title Patient tolerates prosthesis wear & right ankle orthosis wear >90% of awake hours to enable function throughout her day.    Time 3    Period Months    Status On-going    Target Date 12/19/19      PT LONG TERM GOAL #3   Title Standing balance: static without UE  support eyes open for 30 seconds and dynamic with UE support reaching 10", pick up object from floor, managing clothes & static eyes closed 30 seconds safely modified independent.    Time 3    Period Months    Status On-going    Target Date 12/19/19      PT LONG TERM GOAL #4   Title Patient ambulates 300' including grass with 2 forearm crutches or rollator walker and prosthesisAFO modified independent for community access.    Time 3    Period Months     Status Revised    Target Date 12/19/19      PT LONG TERM GOAL #5   Title Patient negotiates ramps & curbs with 2 forearm crutches or rollator walker & prosthesis modified independent for community access.    Time 3    Period Months    Status Revised    Target Date 12/19/19      Additional Long Term Goals   Additional Long Term Goals Yes      PT LONG TERM GOAL #6   Title Patient ambulates 100' around furniture carrying item in free UE with single forearm crutch, prosthesis & AFO modified independent.    Time 4    Period Weeks    Status New    Target Date 12/19/19                 Plan - 12/09/19 1101    Clinical Impression Statement PT & patient reviewed LTGs and functional level that they represent. Both feel that she has potential to meet these LTGs but will need additional PT beyond current plan of care that ends next week.    Personal Factors and Comorbidities Comorbidity 3+;Fitness;Time since onset of injury/illness/exacerbation    Comorbidities L TTA, DM, CVA, sleep apnea, PNA, neuropathy bil. feet and hand, HTN, s/p cervical fusion, s/p back surgery, IBS, asthma, Covid in Jan 2021    Examination-Activity Limitations Hygiene/Grooming;Locomotion Level;Reach Overhead;Stairs;Stand;Transfers    Examination-Participation Restrictions Recruitment consultant Evolving/Moderate complexity    Rehab Potential Good    PT Frequency 2x / week    PT Duration 12 weeks    PT Treatment/Interventions ADLs/Self Care Home Management;DME Instruction;Gait training;Stair training;Functional mobility training;Therapeutic activities;Therapeutic exercise;Balance training;Neuromuscular re-education;Patient/family education;Orthotic Fit/Training;Prosthetic Training;Vestibular    PT Next Visit Plan Do 10th visit progress note, work towards LTGs,  prosthetic gait with forearm crutches community activities & single crutch household activities, therapuetic  exercise for endurance & strength, standing balance with decreased UE support & facilitating upright posture    PT Home Exercise Plan Access Code: [email or web address]    at sink for mini squats with UE support, weight shifts with UE support, and balance feet apart no Ue support all with chair behind her    Consulted and Agree with Plan of Care Patient           Patient will benefit from skilled therapeutic intervention in order to improve the following deficits and impairments:  Abnormal gait, Decreased activity tolerance, Decreased balance, Decreased endurance, Decreased knowledge of use of DME, Decreased mobility, Decreased range of motion, Decreased safety awareness, Decreased strength, Increased edema, Impaired flexibility, Impaired sensation, Postural dysfunction, Prosthetic Dependency, Obesity, Pain  Visit Diagnosis: Other abnormalities of gait and mobility  Unsteadiness on feet  Abnormal posture  History of fall  Muscle weakness (generalized)  Stiffness of left knee, not elsewhere classified     Problem List Patient Active Problem List   Diagnosis Date Noted  .  Allergy to statin medication 12/01/2019  . History of below knee amputation, left ([place]) 07/30/2019  . Gangrene of left foot ([place]) 05/16/2019  . Subacute osteomyelitis of left foot ([place])   . Charcot's joint of foot, left   . Post-operative state   . PAD (peripheral artery disease) ([place]) 12/05/2018  . Osteomyelitis of ankle or foot, acute, left ([place]) 12/05/2018  . MRSA bacteremia 12/05/2018  . Sepsis without acute organ dysfunction ([place])   . Charcot's joint of foot due to diabetes ([place]) 09/18/2018  . BMI 36.0-36.9,adult   . Morbid obesity ([place]) 11/19/2017  . B12 deficiency 11/01/2017  . Diabetic foot ulcer ([place]) 12/14/2016  . Diabetic polyneuropathy associated with type 2 diabetes mellitus ([place]) 12/13/2015  . Lumbar stenosis with neurogenic claudication 09/02/2015  . Chronic constipation 07/13/2015  . OSA (obstructive  sleep apnea) 06/25/2015  . 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/23/2015  . Hypertriglyceridemia 04/23/2015  . Asthma, mild intermittent 02/01/2015  . Type 2 diabetes mellitus with diabetic nephropathy ([place]) 12/31/2014  . Gastroesophageal reflux disease with esophagitis 12/31/2014  . Dysphagia 12/31/2014  . Bilateral carpal tunnel syndrome 12/01/2014  . Diverticulosis of colon (without mention of hemorrhage) 11/30/2012  . Essential hypertension, benign 11/28/2012    Jamey Reas PT, DPT 12/09/2019, 10:48 PM  [facility] [address] [place], [place], [address] Phone: [phone]   Fax:  [phone]  Name: Olivia Romero MRN: [id number] Date of Birth: 11/13/1955

## 2019-12-11 ENCOUNTER — Ambulatory Visit (INDEPENDENT_AMBULATORY_CARE_PROVIDER_SITE_OTHER): Payer: Medicare Other | Admitting: Physical Therapy

## 2019-12-11 ENCOUNTER — Encounter: Payer: Self-pay | Admitting: Physical Therapy

## 2019-12-11 ENCOUNTER — Other Ambulatory Visit: Payer: Self-pay

## 2019-12-11 DIAGNOSIS — R2689 Other abnormalities of gait and mobility: Secondary | ICD-10-CM | POA: Diagnosis not present

## 2019-12-11 DIAGNOSIS — R2681 Unsteadiness on feet: Secondary | ICD-10-CM | POA: Diagnosis not present

## 2019-12-11 DIAGNOSIS — M25662 Stiffness of left knee, not elsewhere classified: Secondary | ICD-10-CM

## 2019-12-11 DIAGNOSIS — Z9181 History of falling: Secondary | ICD-10-CM | POA: Diagnosis not present

## 2019-12-11 DIAGNOSIS — R293 Abnormal posture: Secondary | ICD-10-CM

## 2019-12-11 DIAGNOSIS — M6281 Muscle weakness (generalized): Secondary | ICD-10-CM

## 2019-12-11 NOTE — Therapy (Signed)
[facility] [address] [place], [place], [address] Phone: [phone]   Fax:  [phone]  Physical Therapy Treatment & 10th Visit Progress Note  Patient Details  Name: Olivia Romero MRN: [id number] Date of Birth: 08/23/1955 Referring Provider (PT): Marcus Duda, MD   Encounter Date: 12/11/2019   Progress Note Reporting Period 11/11/2019 to 12/11/2019  See note below for Objective Data and Assessment of Progress/Goals.        PT End of Session - 12/11/19 1104    Visit Number 20    Number of Visits 25    Date for PT Re-Evaluation 12/21/19    Authorization Type [employer] Medicare, $30 co-pay, no visit limit    Progress Note Due on Visit 30    PT Start Time 1100    PT Stop Time 1145    PT Time Calculation (min) 45 min    Equipment Utilized During Treatment Gait belt    Activity Tolerance Patient tolerated treatment well    Behavior During Therapy WFL for tasks assessed/performed           Past Medical History:  Diagnosis Date  . Anemia   . Arthritis   . Asthma   . Colon polyps    adenomatous  . Diabetes mellitus without complication (HCC)   . Diabetic infection of left foot (HCC) 12/2018  . Diverticulosis of colon   . Esophagitis   . GERD (gastroesophageal reflux disease)   . Hemorrhoid    internal  . Hyperlipemia   . Hypertension   . IBS (irritable bowel syndrome)    no current prob - diet controlled  . Myalgia due to statin 11/19/2017  . Neuropathy   . Neuropathy of both feet   . Neuropathy of hand   . Pneumonia    x 4  . PONV (postoperative nausea and vomiting)    on some surgeries but not all procedures  . Skin ulcer of right ankle, limited to breakdown of skin (HCC) 12/31/2016   resolved per patient 05/14/19  . Sleep apnea    has had in the past lost 50 pounds and do longer uses cpap  . Statin intolerance 04/23/2015  . Stroke (HCC) 2009   mini stroke per patient  . Stroke-like episode 2009   TIA - mini stroke per  patient  . 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 Uncontrolled type 2 diabetes mellitus with hyperglycemia (HCC) 11/01/2017    Past Surgical History:  Procedure Laterality Date  . ABDOMINAL HYSTERECTOMY    . AMPUTATION Left 12/06/2018   Procedure: PARTIAL AMPUTATION LEFT FOOT;  Surgeon: Evans, Brent M, DPM;  Location: [facility] OR;  Service: Podiatry;  Laterality: Left;  . AMPUTATION Left 05/16/2019   Procedure: LEFT BELOW KNEE AMPUTATION;  Surgeon: Duda, Marcus V, MD;  Location: [facility] OR;  Service: Orthopedics;  Laterality: Left;  . BACK SURGERY  09/02/2015  . BONE BIOPSY Left 12/06/2018   Procedure: Bone Biopsy;  Surgeon: Evans, Brent M, DPM;  Location: [facility] OR;  Service: Podiatry;  Laterality: Left;  . CERVICAL FUSION    . CESAREAN SECTION  1986  . COLONOSCOPY N/A 11/30/2012   Procedure: COLONOSCOPY;  Surgeon: John N Perry, MD;  Location: [facility]OSCOPY;  Service: Endoscopy;  Laterality: N/A;  . COLONOSCOPY WITH PROPOFOL N/A 01/03/2016   Procedure: COLONOSCOPY WITH PROPOFOL;  Surgeon: Robert T Elliott, MD;  Location: [facility] ENDOSCOPY;  Service: Endoscopy;  Laterality: N/A;  . ESOPHAGOGASTRODUODENOSCOPY (EGD) WITH PROPOFOL N/A 01/14/2015     Procedure: ESOPHAGOGASTRODUODENOSCOPY (EGD) WITH PROPOFOL;  Surgeon: Manya Silvas, MD;  Location: [facility] ENDOSCOPY;  Service: Endoscopy;  Laterality: N/A;  . IRRIGATION AND DEBRIDEMENT FOOT Left 03/13/2018   Procedure: IRRIGATION AND DEBRIDEMENT FOOT WITH BONE BIOPSY WITH MISONIX DEBRIDER;  Surgeon: Evelina Bucy, DPM;  Location: [place];  Service: Podiatry;  Laterality: Left;  . IRRIGATION AND DEBRIDEMENT FOOT Left 12/06/2018   Procedure: Irrigation And Debridement Foot;  Surgeon: Edrick Kins, DPM;  Location: [place];  Service: Podiatry;  Laterality: Left;  . LEG AMPUTATION BELOW KNEE Left   . LUMBAR WOUND DEBRIDEMENT N/A 10/01/2015   Procedure: LUMBAR WOUND DEBRIDEMENT;  Surgeon: Ashok Pall, MD;  Location: [facility];  Service:  Neurosurgery;  Laterality: N/A;  LUMBAR WOUND DEBRIDEMENT  . NASAL SINUS SURGERY    . SAVORY DILATION N/A 01/14/2015   Procedure: SAVORY DILATION;  Surgeon: Manya Silvas, MD;  Location: [facility] END[facility]COPY;  Service: Endoscopy;  Laterality: N/A;  . TONSILLECTOMY    . WISDOM TOOTH EXTRACTION      There were no vitals filed for this visit.   Subjective Assessment - 12/11/19 1100    Subjective She goes to [employer] this afternoon. She has been using bandaid until she sees [name].  She will be able to purchase forearm crutches next Wednesday when gets her check.    Pertinent History L TTA, DM, CVA, sleep apnea, PNA, neuropathy bil. 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 No/denies    Pain Onset In the past 7 days                             [facility] Adult PT Treatment/Exercise - 12/11/19 1100      Transfers   Transfers Sit to Stand;Stand to [employer] Transfers    Sit to Stand 5: Supervision;With upper extremity assist;With armrests;From chair/3-in-1   to forearm crutches   Sit to Stand Details Visual cues for safe use of DME/AE;Verbal cues for precautions/safety;Verbal cues for technique    Stand to Sit 5: Supervision;With upper extremity assist;With armrests;To chair/3-in-1   from forearm crutches   Stand to Sit Details (indicate cue type and reason) Visual cues for safe use of DME/AE;Verbal cues for precautions/safety;Verbal cues for technique      Ambulation/Gait   Ambulation/Gait Yes    Ambulation/Gait Assistance 5: Supervision;4: Min assist   MinA single crutch, supervision 2 crutches   Ambulation Distance (Feet) 150 Feet   150' 2 forearm crutches; 25' & 40' single crutch   Assistive device  Prosthesis;Lofstrands;Rollator;L Forearm Crutch   enter/ exit with rollator   Gait Pattern Step-through pattern;Decreased arm swing - left;Decreased step length - right;Decreased stance time - left;Antalgic;Trunk flexed    Ambulation Surface Level;Indoor    Ramp --    Curb --      Neuro Re-ed    Neuro Re-ed Details  --      Knee/Hip Exercises: Aerobic   Nustep level 6 with BUEs & BLEs 10 minutes PT cues on technique & full motion    PT provided patient education while patient using Nustep.       Prosthetics   Prosthetic Care Comments  bandaid over wound on patella.  Current prosthetic wear tolerance (days/week)  daily    Current prosthetic wear tolerance (#hours/day)  prosthesis most of awake hours drying 4x/day    Residual limb condition  patella wound healed with callous granulation.      Education Provided Residual limb care;Correct ply sock adjustment;Other (comment)   see prosthetic care comments                 PT Education - 12/11/19 1100    Education Details Use of towel roll in supine along spine & perpendicular mid-thoracic and sitting along spine including UE & LE movements. Positioning BLEs with 4" block for support & posture.  Use of 2 tennis balls in stockinette as self message in low back & gluteal area.    Person(s) Educated Patient    Methods Explanation;Demonstration;Tactile cues;Verbal cues    Comprehension Verbalized understanding;Returned demonstration            PT Short Term Goals - 11/27/19 1239      PT SHORT TERM GOAL #1   Title Patient verbalizes understanding of adjusting ply socks with prosthesis wear. (All STGs Target Date: 11/28/2019)    Baseline MET 11/24/2019    Time 1    Period Months    Status Achieved    Target Date 11/28/19      PT SHORT TERM GOAL #2   Title Patient tolerates prosthesis wear >12 hrs total/ day without skin or limb pain issues.    Baseline MET 11/24/2019    Time 1    Period Months    Status Achieved    Target  Date 11/28/19      PT SHORT TERM GOAL #3   Title Patient standing balance static with eyes open 20 seconds within 3 attempts and turns head right/left/up/down without UE support with supervision.    Baseline MET 11/27/2019    Time 1    Period Months    Status Achieved    Target Date 11/28/19      PT SHORT TERM GOAL #4   Title Patient ambulates 200' with forearm crutches & prosthesis with supervision.    Baseline MET 11/24/2019    Time 1    Period Months    Status Achieved    Target Date 11/28/19      PT SHORT TERM GOAL #5   Title patient negotiates ramps & curbs with forearm crutches & prosthesis with supervision.    Baseline MET 11/27/2019    Time 1    Period Months    Status Achieved    Target Date 11/28/19             PT Long Term Goals - 11/27/19 1307      PT LONG TERM GOAL #1   Title Patient verbalizes & demonstrates proper prosthetic care to enable safe utilization of prosthesis.  (All LTGs Target Date 12/19/2019)    Time 3    Period Months    Status On-going    Target Date 12/19/19      PT LONG TERM GOAL #2   Title Patient tolerates prosthesis wear & right ankle orthosis wear >90% of awake hours to enable function throughout her day.    Time 3    Period Months    Status On-going    Target Date 12/19/19      PT LONG TERM GOAL #3   Title Standing balance: static without UE support eyes open for 30 seconds and dynamic with UE support reaching 10", pick up object from floor,  managing clothes & static eyes closed 30 seconds safely modified independent.    Time 3    Period Months    Status On-going    Target Date 12/19/19      PT LONG TERM GOAL #4   Title Patient ambulates 300' including grass with 2 forearm crutches or rollator walker and prosthesisAFO modified independent for community access.    Time 3    Period Months    Status Revised    Target Date 12/19/19      PT LONG TERM GOAL #5   Title Patient negotiates ramps & curbs with 2 forearm crutches or  rollator walker & prosthesis modified independent for community access.    Time 3    Period Months    Status Revised    Target Date 12/19/19      Additional Long Term Goals   Additional Long Term Goals Yes      PT LONG TERM GOAL #6   Title Patient ambulates 100' around furniture carrying item in free UE with single forearm crutch, prosthesis & AFO modified independent.    Time 4    Period Weeks    Status New    Target Date 12/19/19                 Plan - 12/11/19 1104    Clinical Impression Statement Patient is making progress with her mobility with her prosthesis & AFO but will probably need recertification next week at end of 90-day plan of care. Patient reported feeling tight today and PT instructed in using towel roll & 2 tennis balls in stockinette for stretching / posture.    Personal Factors and Comorbidities Comorbidity 3+;Fitness;Time since onset of injury/illness/exacerbation    Comorbidities L TTA, DM, CVA, sleep apnea, PNA, neuropathy bil. feet and hand, HTN, s/p cervical fusion, s/p back surgery, IBS, asthma, Covid in Jan 2021    Examination-Activity Limitations Hygiene/Grooming;Locomotion Level;Reach Overhead;Stairs;Stand;Transfers    Examination-Participation Restrictions Community Activity;Volunteer    Stability/Clinical Decision Making Evolving/Moderate complexity    Rehab Potential Good    PT Frequency 2x / week    PT Duration 12 weeks    PT Treatment/Interventions ADLs/Self Care Home Management;DME Instruction;Gait training;Stair training;Functional mobility training;Therapeutic activities;Therapeutic exercise;Balance training;Neuromuscular re-education;Patient/family education;Orthotic Fit/Training;Prosthetic Training;Vestibular    PT Next Visit Plan check LTGs & recertify 7/15,  prosthetic gait with forearm crutches community activities & single crutch household activities, therapuetic exercise for endurance & strength, standing balance with decreased UE  support & facilitating upright posture    PT Home Exercise Plan Access Code: [email or web address]    at sink for mini squats with UE support, weight shifts with UE support, and balance feet apart no Ue support all with chair behind her    Consulted and Agree with Plan of Care Patient           Patient will benefit from skilled therapeutic intervention in order to improve the following deficits and impairments:  Abnormal gait, Decreased activity tolerance, Decreased balance, Decreased endurance, Decreased knowledge of use of DME, Decreased mobility, Decreased range of motion, Decreased safety awareness, Decreased strength, Increased edema, Impaired flexibility, Impaired sensation, Postural dysfunction, Prosthetic Dependency, Obesity, Pain  Visit Diagnosis: Other abnormalities of gait and mobility  Unsteadiness on feet  Abnormal posture  History of fall  Muscle weakness (generalized)  Stiffness of left knee, not elsewhere classified     Problem List Patient Active Problem List   Diagnosis Date Noted  . Allergy to statin medication 12/01/2019  .   History of below knee amputation, left (HCC) 07/30/2019  . Gangrene of left foot (HCC) 05/16/2019  . Subacute osteomyelitis of left foot (HCC)   . Charcot's joint of foot, left   . Post-operative state   . PAD (peripheral artery disease) (HCC) 12/05/2018  . Osteomyelitis of ankle or foot, acute, left (HCC) 12/05/2018  . MRSA bacteremia 12/05/2018  . Sepsis without acute organ dysfunction (HCC)   . 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) 09/18/2018  . BMI 36.0-36.9,adult   . Morbid obesity (HCC) 11/19/2017  . B12 deficiency 11/01/2017  . Diabetic foot ulcer (HCC) 12/14/2016  . Diabetic polyneuropathy associated with type 2 diabetes mellitus (HCC) 12/13/2015  . Lumbar stenosis with neurogenic claudication 09/02/2015  . Chronic constipation 07/13/2015  . OSA (obstructive sleep apnea) 06/25/2015  . Primary osteoarthritis involving multiple  joints 04/23/2015  . Hypertriglyceridemia 04/23/2015  . Asthma, mild intermittent 02/01/2015  . Type 2 diabetes mellitus with diabetic nephropathy (HCC) 12/31/2014  . Gastroesophageal reflux disease with esophagitis 12/31/2014  . Dysphagia 12/31/2014  . Bilateral carpal tunnel syndrome 12/01/2014  . Diverticulosis of colon (without mention of hemorrhage) 11/30/2012  . Essential hypertension, benign 11/28/2012      PT, DPT 12/11/2019, 5:26 PM  [facility] [address] [place], [place], [address] Phone: [phone]   Fax:  [phone]  Name: Olivia Romero MRN: [id number] Date of Birth: 04/18/1956   

## 2019-12-16 ENCOUNTER — Encounter: Payer: Self-pay | Admitting: Physical Therapy

## 2019-12-16 ENCOUNTER — Other Ambulatory Visit: Payer: Self-pay

## 2019-12-16 ENCOUNTER — Ambulatory Visit (INDEPENDENT_AMBULATORY_CARE_PROVIDER_SITE_OTHER): Payer: Medicare Other | Admitting: Physical Therapy

## 2019-12-16 DIAGNOSIS — R2681 Unsteadiness on feet: Secondary | ICD-10-CM

## 2019-12-16 DIAGNOSIS — R2689 Other abnormalities of gait and mobility: Secondary | ICD-10-CM

## 2019-12-16 DIAGNOSIS — R293 Abnormal posture: Secondary | ICD-10-CM | POA: Diagnosis not present

## 2019-12-16 DIAGNOSIS — Z9181 History of falling: Secondary | ICD-10-CM | POA: Diagnosis not present

## 2019-12-16 DIAGNOSIS — M6281 Muscle weakness (generalized): Secondary | ICD-10-CM

## 2019-12-16 DIAGNOSIS — M25662 Stiffness of left knee, not elsewhere classified: Secondary | ICD-10-CM

## 2019-12-16 NOTE — Therapy (Signed)
[facility] Physical Therapy [address] [place], [place], [address] Phone: [phone]   Fax:  [phone]  Physical Therapy Treatment  Patient Details  Name: Olivia Romero MRN: [id number] Date of Birth: 12-Jun-1955 Referring Provider (PT): Meridee Score, MD   Encounter Date: 12/16/2019   PT End of Session - 12/16/19 1408    Visit Number 21    Number of Visits 25    Date for PT Re-Evaluation 12/21/19    Authorization Type [employer] Medicare, $30 co-pay, no visit limit    Progress Note Due on Visit 30    PT Start Time 1052    PT Stop Time 1145    PT Time Calculation (min) 53 min    Equipment Utilized During Treatment Gait belt    Activity Tolerance Patient tolerated treatment well    Behavior During Therapy [facility] for tasks assessed/performed           Past Medical History:  Diagnosis Date  . Anemia   . Arthritis   . Asthma   . Colon polyps    adenomatous  . Diabetes mellitus without complication ([place])   . Diabetic infection of left foot ([place]) 12/2018  . Diverticulosis of colon   . Esophagitis   . GERD (gastroesophageal reflux disease)   . Hemorrhoid    internal  . Hyperlipemia   . Hypertension   . IBS (irritable bowel syndrome)    no current prob - diet controlled  . Myalgia due to statin 11/19/2017  . Neuropathy   . Neuropathy of both feet   . Neuropathy of hand   . Pneumonia    x 4  . PONV (postoperative nausea and vomiting)    on some surgeries but not all procedures  . Skin ulcer of right ankle, limited to breakdown of skin ([place]) 12/31/2016   resolved per patient 05/14/19  . Sleep apnea    has had in the past lost 50 pounds and do longer uses cpap  . Statin intolerance 04/23/2015  . Stroke [facility]) 2009   mini stroke per patient  . Stroke-like episode 2009   TIA - mini stroke per patient  . 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 Uncontrolled type 2 diabetes mellitus with hyperglycemia ([place]) 11/01/2017     Past Surgical History:  Procedure Laterality Date  . ABDOMINAL HYSTERECTOMY    . AMPUTATION Left 12/06/2018   Procedure: PARTIAL AMPUTATION LEFT FOOT;  Surgeon: Edrick Kins, DPM;  Location: [place];  Service: Podiatry;  Laterality: Left;  . AMPUTATION Left 05/16/2019   Procedure: LEFT BELOW KNEE AMPUTATION;  Surgeon: Newt Minion, MD;  Location: [place];  Service: Orthopedics;  Laterality: Left;  . BACK SURGERY  09/02/2015  . BONE BIOPSY Left 12/06/2018   Procedure: Bone Biopsy;  Surgeon: Edrick Kins, DPM;  Location: [place];  Service: Podiatry;  Laterality: Left;  . CERVICAL FUSION    . CESAREAN SECTION  1986  . COLONOSCOPY N/A 11/30/2012   Procedure: COLONOSCOPY;  Surgeon: Irene Shipper, MD;  Location: [facility] ENDOSCOPY;  Service: Endoscopy;  Laterality: N/A;  . COLONOSCOPY WITH PROPOFOL N/A 01/03/2016   Procedure: COLONOSCOPY WITH PROPOFOL;  Surgeon: Manya Silvas, MD;  Location: [facility] ENDOSCOPY;  Service: Endoscopy;  Laterality: N/A;  . ESOPHAGOGASTRODUODENOSCOPY (EGD) WITH PROPOFOL N/A 01/14/2015   Procedure: ESOPHAGOGASTRODUODENOSCOPY (EGD) WITH PROPOFOL;  Surgeon: Manya Silvas, MD;  Location: [facility] ENDOSCOPY;  Service: Endoscopy;  Laterality: N/A;  . IRRIGATION AND DEBRIDEMENT FOOT Left 03/13/2018  Procedure: IRRIGATION AND DEBRIDEMENT FOOT WITH BONE BIOPSY WITH MISONIX DEBRIDER;  Surgeon: Evelina Bucy, DPM;  Location: [place];  Service: Podiatry;  Laterality: Left;  . IRRIGATION AND DEBRIDEMENT FOOT Left 12/06/2018   Procedure: Irrigation And Debridement Foot;  Surgeon: Edrick Kins, DPM;  Location: [place];  Service: Podiatry;  Laterality: Left;  . LEG AMPUTATION BELOW KNEE Left   . LUMBAR WOUND DEBRIDEMENT N/A 10/01/2015   Procedure: LUMBAR WOUND DEBRIDEMENT;  Surgeon: Ashok Pall, MD;  Location: [place] NEURO ORS;  Service: Neurosurgery;  Laterality: N/A;  LUMBAR WOUND DEBRIDEMENT  . NASAL SINUS SURGERY    . SAVORY DILATION N/A 01/14/2015   Procedure: SAVORY DILATION;  Surgeon: Manya Silvas, MD;  Location: [facility] ENDOSCOPY;  Service: Endoscopy;  Laterality: N/A;  . TONSILLECTOMY    . WISDOM TOOTH EXTRACTION      There were no vitals filed for this visit.   Subjective Assessment - 12/16/19 1050    Subjective She went to church for first time using std RW. She is questioning getting in & out mini van.    Pertinent History L TTA, DM, CVA, sleep apnea, PNA, neuropathy bil. 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 No/denies    Pain Onset In the past 7 days              [facility] PT Assessment - 12/16/19 1050      Assessment   Medical Diagnosis Left Transtibial Amputation    Referring Provider (PT) Meridee Score, MD      Standardized Balance Assessment   Standardized Balance Assessment Timed Up and Go Test      Timed Up and Go Test   Normal TUG (seconds) 24.25   w/rollator 24.25"  w/2 crutches 28.07"  w/1 crutch 45.37"                        [facility] Adult PT Treatment/Exercise - 12/16/19 1050      Transfers   Transfers Sit to Stand;Stand to [employer]    Sit to Stand 5: Supervision;With upper extremity assist;With armrests;From chair/3-in-1   to forearm crutches   Sit to Stand Details Visual cues for safe use of DME/AE;Verbal cues for precautions/safety;Verbal cues for technique    Stand to Sit 5: Supervision;With upper extremity assist;With armrests;To chair/3-in-1   from forearm crutches   Stand to Sit Details (indicate cue type and reason) Visual cues for safe use of DME/AE;Verbal cues for precautions/safety;Verbal cues for technique      Ambulation/Gait   Ambulation/Gait Yes    Ambulation/Gait Assistance 5: Supervision;4: Min assist   MinA single crutch, supervision 2  crutches   Ambulation/Gait Assistance Details tactile & verbal cues on balance reactions    Ambulation Distance (Feet) 50 Feet   50' 2 crutches & 20' X 2 single crutch   Assistive device Prosthesis;Lofstrands;Rollator;L Forearm Crutch   enter/ exit with rollator   Gait Pattern Step-through pattern;Decreased arm swing - left;Decreased step length - right;Decreased stance time - left;Antalgic;Trunk flexed    Ambulation Surface Level;Indoor      Therapeutic Activites    Other Therapeutic Activities PT demo using step stool ascending backwards with RW & prosthesis to get in/out of  Lucianne Lei with higher seat like church van.  Pt return demo understanding.        Neuro Re-ed    Neuro Re-ed Details  standing with single UE support on rail of treadmill:  turning head side to side 3 reps rt & lt, looking to side with head & shoulder motion 3 reps rt & lt, looking behind shoulders with head, shoulder, wt shift & hip rotation 3 reps rt & lt.       Knee/Hip Exercises: Aerobic   Nustep level 6 with BUEs & BLEs 8 minutes PT cues on technique & full motion    PT provided patient education while patient using Nustep.       Prosthetics   Prosthetic Care Comments  Prosthetist modified anterior proximal liner and now more comfortable.      Current prosthetic wear tolerance (days/week)  daily    Current prosthetic wear tolerance (#hours/day)  prosthesis most of awake hours drying 4x/day    Residual limb condition  wound on patella healed.      Education Provided Residual limb care;Correct ply sock adjustment;Other (comment)   see prosthetic care comments   Person(s) Educated Patient    Education Method Explanation;Verbal cues    Education Method Verbalized understanding;Verbal cues required;Needs further instruction    Donning Prosthesis Supervision    Doffing Prosthesis Supervision                    PT Short Term Goals - 11/27/19 1239      PT SHORT TERM GOAL #1   Title Patient verbalizes  understanding of adjusting ply socks with prosthesis wear. (All STGs Target Date: 11/28/2019)    Baseline MET 11/24/2019    Time 1    Period Months    Status Achieved    Target Date 11/28/19      PT SHORT TERM GOAL #2   Title Patient tolerates prosthesis wear >12 hrs total/ day without skin or limb pain issues.    Baseline MET 11/24/2019    Time 1    Period Months    Status Achieved    Target Date 11/28/19      PT SHORT TERM GOAL #3   Title Patient standing balance static with eyes open 20 seconds within 3 attempts and turns head right/left/up/down without UE support with supervision.    Baseline MET 11/27/2019    Time 1    Period Months    Status Achieved    Target Date 11/28/19      PT SHORT TERM GOAL #4   Title Patient ambulates 200' with forearm crutches & prosthesis with supervision.    Baseline MET 11/24/2019    Time 1    Period Months    Status Achieved    Target Date 11/28/19      PT SHORT TERM GOAL #5   Title patient negotiates ramps & curbs with forearm crutches & prosthesis with supervision.    Baseline MET 11/27/2019    Time 1    Period Months    Status Achieved    Target Date 11/28/19             PT Long Term Goals - 12/16/19 2314      PT LONG TERM GOAL #1   Title Patient verbalizes & demonstrates proper prosthetic care to enable safe utilization of prosthesis.  (All LTGs Target Date 12/19/2019)    Baseline Progressing but not fully met 12/16/2019  Patient has basic understanding of prosthetic care but  needs PT cues to problem solve issues    Time 3    Period Months    Status Not Met      PT LONG TERM GOAL #2   Title Patient tolerates prosthesis wear & right ankle orthosis wear >90% of awake hours to enable function throughout her day.    Baseline MET 12/16/2019    Time 3    Period Months    Status Achieved      PT LONG TERM GOAL #3   Title Standing balance: static without UE support eyes open for 30 seconds and dynamic with UE support reaching 10",  pick up object from floor, managing clothes & static eyes closed 30 seconds safely modified independent.    Baseline Progressing but not met 12/16/2019  Static balance 38seconds with close supervision.  Standing with single crutch: reaching 5" and to knee level with close supervision; closes eyes and balance loss requiring assistance to prevent fall.    Time 3    Period Months    Status Not Met      PT LONG TERM GOAL #4   Title Patient ambulates 300' including grass with 2 forearm crutches or rollator walker and prosthesisAFO modified independent for community access.    Time 3    Period Months    Status Revised      PT LONG TERM GOAL #5   Title Patient negotiates ramps & curbs with 2 forearm crutches or rollator walker & prosthesis modified independent for community access.    Time 3    Period Months    Status Revised      PT LONG TERM GOAL #6   Title Patient ambulates 100' around furniture carrying item in free UE with single forearm crutch, prosthesis & AFO modified independent.    Time 4    Period Weeks    Status New                 Plan - 12/16/19 2323    Clinical Impression Statement Patient met LTG wear and progressed but did not fully meet prosthetic care or balance goal.  Patient appears to need further skilled training to meet her potential for function & safety.  PT worked on balance to look over her shoulders and improved with skilled instruction.  PT also instructed in using step stool to get in / out of van with higher seat.    Personal Factors and Comorbidities Comorbidity 3+;Fitness;Time since onset of injury/illness/exacerbation    Comorbidities L TTA, DM, CVA, sleep apnea, PNA, neuropathy bil. feet and hand, HTN, s/p cervical fusion, s/p back surgery, IBS, asthma, Covid in Jan 2021    Examination-Activity Limitations Hygiene/Grooming;Locomotion Level;Reach Overhead;Stairs;Stand;Transfers    Examination-Participation Restrictions Journalist, newspaper Evolving/Moderate complexity    Rehab Potential Good    PT Frequency 2x / week    PT Duration 12 weeks    PT Treatment/Interventions ADLs/Self Care Home Management;DME Instruction;Gait training;Stair training;Functional mobility training;Therapeutic activities;Therapeutic exercise;Balance training;Neuromuscular re-education;Patient/family education;Orthotic Fit/Training;Prosthetic Training;Vestibular    PT Next Visit Plan check remaining LTGs & recertify 0/09,  prosthetic gait with forearm crutches community activities & single crutch household activities, therapuetic exercise for endurance & strength, standing balance with decreased UE support & facilitating upright posture    PT Home Exercise Plan Access Code: [email or web address]    at sink for mini squats with UE support, weight shifts with UE support, and balance feet apart no Ue support all with chair behind her  Consulted and Agree with Plan of Care Patient           Patient will benefit from skilled therapeutic intervention in order to improve the following deficits and impairments:  Abnormal gait, Decreased activity tolerance, Decreased balance, Decreased endurance, Decreased knowledge of use of DME, Decreased mobility, Decreased range of motion, Decreased safety awareness, Decreased strength, Increased edema, Impaired flexibility, Impaired sensation, Postural dysfunction, Prosthetic Dependency, Obesity, Pain  Visit Diagnosis: Unsteadiness on feet  Other abnormalities of gait and mobility  Abnormal posture  History of fall  Muscle weakness (generalized)  Stiffness of left knee, not elsewhere classified     Problem List Patient Active Problem List   Diagnosis Date Noted  . Allergy to statin medication 12/01/2019  . History of below knee amputation, left ([place]) 07/30/2019  . Gangrene of left foot ([place]) 05/16/2019  . Subacute osteomyelitis of left foot ([place])   . Charcot's joint of foot, left   .  Post-operative state   . PAD (peripheral artery disease) ([place]) 12/05/2018  . Osteomyelitis of ankle or foot, acute, left ([place]) 12/05/2018  . MRSA bacteremia 12/05/2018  . Sepsis without acute organ dysfunction ([place])   . Charcot's joint of foot due to diabetes ([place]) 09/18/2018  . BMI 36.0-36.9,adult   . Morbid obesity ([place]) 11/19/2017  . B12 deficiency 11/01/2017  . Diabetic foot ulcer ([place]) 12/14/2016  . Diabetic polyneuropathy associated with type 2 diabetes mellitus ([place]) 12/13/2015  . Lumbar stenosis with neurogenic claudication 09/02/2015  . Chronic constipation 07/13/2015  . OSA (obstructive sleep apnea) 06/25/2015  . 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/23/2015  . Hypertriglyceridemia 04/23/2015  . Asthma, mild intermittent 02/01/2015  . Type 2 diabetes mellitus with diabetic nephropathy ([place]) 12/31/2014  . Gastroesophageal reflux disease with esophagitis 12/31/2014  . Dysphagia 12/31/2014  . Bilateral carpal tunnel syndrome 12/01/2014  . Diverticulosis of colon (without mention of hemorrhage) 11/30/2012  . Essential hypertension, benign 11/28/2012    Jamey Reas PT, DPT 12/16/2019, 11:27 PM  [facility] Physical Therapy [address] [place], [place], [address] Phone: [phone]   Fax:  [phone]  Name: Olivia Romero MRN: [id number] Date of Birth: 12-Mar-1956

## 2019-12-17 ENCOUNTER — Telehealth: Payer: Self-pay

## 2019-12-17 NOTE — Telephone Encounter (Signed)
Copied from [place] [phone]. Topic: General - Other >> Dec 17, 2019  1:20 PM Rainey Pines A wrote: Patient would like to know what Lucio Edward would advise in regards to patient taking the covid vaccine considering her underlying health conditions. Patient isnt able to take the flu shot so she would like to know [facility] recommendation.

## 2019-12-17 NOTE — Telephone Encounter (Signed)
Leisa recommends appointment to address any concerns regarding vaccine

## 2019-12-18 ENCOUNTER — Encounter: Payer: Self-pay | Admitting: Physical Therapy

## 2019-12-18 ENCOUNTER — Ambulatory Visit (INDEPENDENT_AMBULATORY_CARE_PROVIDER_SITE_OTHER): Payer: Medicare Other | Admitting: Physical Therapy

## 2019-12-18 ENCOUNTER — Other Ambulatory Visit: Payer: Self-pay

## 2019-12-18 DIAGNOSIS — M25662 Stiffness of left knee, not elsewhere classified: Secondary | ICD-10-CM

## 2019-12-18 DIAGNOSIS — R293 Abnormal posture: Secondary | ICD-10-CM | POA: Diagnosis not present

## 2019-12-18 DIAGNOSIS — R2681 Unsteadiness on feet: Secondary | ICD-10-CM

## 2019-12-18 DIAGNOSIS — R2689 Other abnormalities of gait and mobility: Secondary | ICD-10-CM

## 2019-12-18 DIAGNOSIS — Z9181 History of falling: Secondary | ICD-10-CM

## 2019-12-18 DIAGNOSIS — M6281 Muscle weakness (generalized): Secondary | ICD-10-CM

## 2019-12-18 NOTE — Therapy (Signed)
[facility] Physical Therapy [address] [place], [place], [address] Phone: [phone]   Fax:  [phone]  Physical Therapy Treatment & Recertification  Patient Details  Name: Olivia Romero MRN: [id number] Date of Birth: [birth year] Referring Provider (PT): Meridee Score, MD   Encounter Date: 12/18/2019   PT End of Session - 12/18/19 1109    Visit Number 22    Number of Visits 26    Date for PT Re-Evaluation 03/11/20    Authorization Type [employer] Medicare, $30 co-pay, no visit limit    Progress Note Due on Visit 30    PT Start Time 1100    PT Stop Time 1145    PT Time Calculation (min) 45 min    Equipment Utilized During Treatment Gait belt    Activity Tolerance Patient tolerated treatment well    Behavior During Therapy [facility] for tasks assessed/performed           Past Medical History:  Diagnosis Date  . Anemia   . Arthritis   . Asthma   . Colon polyps    adenomatous  . Diabetes mellitus without complication ([place])   . Diabetic infection of left foot ([place]) 12/2018  . Diverticulosis of colon   . Esophagitis   . GERD (gastroesophageal reflux disease)   . Hemorrhoid    internal  . Hyperlipemia   . Hypertension   . IBS (irritable bowel syndrome)    no current prob - diet controlled  . Myalgia due to statin 11/19/2017  . Neuropathy   . Neuropathy of both feet   . Neuropathy of hand   . Pneumonia    x 4  . PONV (postoperative nausea and vomiting)    on some surgeries but not all procedures  . Skin ulcer of right ankle, limited to breakdown of skin ([place]) 12/31/2016   resolved per patient 05/14/19  . Sleep apnea    has had in the past lost 50 pounds and do longer uses cpap  . Statin intolerance 04/23/2015  . Stroke [facility]) 2009   mini stroke per patient  . Stroke-like episode 2009   TIA - mini stroke per patient  . 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 Uncontrolled type 2 diabetes mellitus with hyperglycemia  ([place]) 11/01/2017    Past Surgical History:  Procedure Laterality Date  . ABDOMINAL HYSTERECTOMY    . AMPUTATION Left 12/06/2018   Procedure: PARTIAL AMPUTATION LEFT FOOT;  Surgeon: Edrick Kins, DPM;  Location: [place];  Service: Podiatry;  Laterality: Left;  . AMPUTATION Left 05/16/2019   Procedure: LEFT BELOW KNEE AMPUTATION;  Surgeon: Newt Minion, MD;  Location: [place];  Service: Orthopedics;  Laterality: Left;  . BACK SURGERY  09/02/2015  . BONE BIOPSY Left 12/06/2018   Procedure: Bone Biopsy;  Surgeon: Edrick Kins, DPM;  Location: [place];  Service: Podiatry;  Laterality: Left;  . CERVICAL FUSION    . CESAREAN SECTION  1986  . COLONOSCOPY N/A 11/30/2012   Procedure: COLONOSCOPY;  Surgeon: Irene Shipper, MD;  Location: [facility] ENDOSCOPY;  Service: Endoscopy;  Laterality: N/A;  . COLONOSCOPY WITH PROPOFOL N/A 01/03/2016   Procedure: COLONOSCOPY WITH PROPOFOL;  Surgeon: Manya Silvas, MD;  Location: [facility] ENDOSCOPY;  Service: Endoscopy;  Laterality: N/A;  . ESOPHAGOGASTRODUODENOSCOPY (EGD) WITH PROPOFOL N/A 01/14/2015   Procedure: ESOPHAGOGASTRODUODENOSCOPY (EGD) WITH PROPOFOL;  Surgeon: Manya Silvas, MD;  Location: [facility] ENDOSCOPY;  Service: Endoscopy;  Laterality: N/A;  . IRRIGATION AND DEBRIDEMENT FOOT  Left 03/13/2018   Procedure: IRRIGATION AND DEBRIDEMENT FOOT WITH BONE BIOPSY WITH MISONIX DEBRIDER;  Surgeon: Evelina Bucy, DPM;  Location: [place];  Service: Podiatry;  Laterality: Left;  . IRRIGATION AND DEBRIDEMENT FOOT Left 12/06/2018   Procedure: Irrigation And Debridement Foot;  Surgeon: Edrick Kins, DPM;  Location: [place];  Service: Podiatry;  Laterality: Left;  . LEG AMPUTATION BELOW KNEE Left   . LUMBAR WOUND DEBRIDEMENT N/A 10/01/2015   Procedure: LUMBAR WOUND DEBRIDEMENT;  Surgeon: Ashok Pall, MD;  Location: [place] NEURO ORS;  Service: Neurosurgery;  Laterality: N/A;  LUMBAR WOUND DEBRIDEMENT  . NASAL SINUS SURGERY    . SAVORY DILATION N/A 01/14/2015   Procedure: SAVORY DILATION;   Surgeon: Manya Silvas, MD;  Location: [facility] ENDOSCOPY;  Service: Endoscopy;  Laterality: N/A;  . TONSILLECTOMY    . WISDOM TOOTH EXTRACTION      There were no vitals filed for this visit.   Subjective Assessment - 12/18/19 1100    Subjective She is ordering crutches next week. She feels PT has improved her mobility but needs more PT to do more.  She exercised this morning and it feels better.    Pertinent History L TTA, DM, CVA, sleep apnea, PNA, neuropathy bil. 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 No/denies    Pain Onset In the past 7 days              [facility] PT Assessment - 12/18/19 1100      Assessment   Medical Diagnosis Left Transtibial Amputation    Referring Provider (PT) Meridee Score, MD    Onset Date/Surgical Date 09/19/19   prosthesis delivery     Ambulation/Gait   Ramp 5: Supervision   Min guard w/2 forearm crutches, AFO & prosthesis   Ramp Details (indicate cue type and reason) verbal cues on weight shift, sequence & upright posture    Curb 5: Supervision   min guard w/2 forearm crutches, AFO & prosthesis   Curb Details (indicate cue type and reason) tactile & verbal cues on sequence & balance reactions      Static Standing Balance   Static Standing - Balance Support No upper extremity supported    Static Standing - Level of Assistance 5: Stand by assistance    Static Standing - Comment/# of Minutes 36 seconds      Dynamic Standing Balance   Dynamic Standing - Balance Support Left upper extremity supported   forearm crutch   Dynamic Standing - Level of Assistance 5: Stand by assistance;4: Min assist    Dynamic Standing - Comments see Berg Balance section - task performed with forearm crutch support.         Standardized Balance Assessment   Standardized Balance Assessment Berg Balance Test;Timed Up and Go Test;Dynamic Gait Index      Berg Balance Test   Sit to Stand Able to stand  independently using hands   with single forearm crutch support   Standing Unsupported Able to stand 2 minutes with supervision   with single forearm crutch support   Sitting with Back Unsupported but Feet Supported on Floor or Stool Able to sit safely and securely 2 minutes    Stand to Sit Controls descent by using  hands   with single forearm crutch support   Transfers Able to transfer safely, definite need of hands    Standing Unsupported with Eyes Closed Unable to keep eyes closed 3 seconds but stays steady   with single forearm crutch support   Standing Unsupported with Feet Together Needs help to attain position and unable to hold for 15 seconds   with single forearm crutch support   From Standing, Reach Forward with Outstretched Arm Reaches forward but needs supervision   with single forearm crutch support, 5" w/minA   From Standing Position, Pick up Object from Floor Unable to pick up and needs supervision   with single forearm crutch support, reaches knee w/min guard   From Standing Position, Turn to Look Behind Over each Shoulder Needs assist to keep from losing balance and falling   w/single forearm crutch, minA to look to side & behind   Turn 360 Degrees Needs assistance while turning   modA with single forearm crutch support   Standing Unsupported, Alternately Place Feet on Step/Stool Needs assistance to keep from falling or unable to try   modA with single forearm crutch support   Standing Unsupported, One Foot in Front Needs help to step but can hold 15 seconds   with single forearm crutch support   Standing on One Leg Tries to lift leg/unable to hold 3 seconds but remains standing independently   with single forearm crutch support   Total Score 21    Berg comment: tasks of Berg performed with single  forearm crutch      Dynamic Gait Index   Level Surface Moderate Impairment   with bilaateral forearm crutches   Change in Gait Speed Severe Impairment   with bilaateral forearm crutches   Gait with Horizontal Head Turns Moderate Impairment   with bilaateral forearm crutches   Gait with Vertical Head Turns Moderate Impairment   with bilaateral forearm crutches   Gait and Pivot Turn Moderate Impairment   with bilaateral forearm crutches   Step Over Obstacle Moderate Impairment   with bilaateral forearm crutches   Step Around Obstacles Moderate Impairment   with bilaateral forearm crutches   Steps Moderate Impairment   with 2 rails   Total Score 7    DGI comment: DGI performed with bilaateral forearm crutches      Timed Up and Go Test   Normal TUG (seconds) 24.25   24.25" w/rollator, 28.07" w/2 crutches, 45.37" w/1 crutch          Prosthetics Assessment - 12/18/19 1100      Prosthetics   Prosthetic Care Independent with Prosthetic cleaning;Proper wear schedule/adjustment    Prosthetic Care Dependent with Skin check;Residual limb care;Correct ply sock adjustment    Donning prosthesis  Modified independent (Device/Increase time)    Doffing prosthesis  Modified independent (Device/Increase time)                        [facility] Adult PT Treatment/Exercise - 12/18/19 1100      Transfers   Transfers Sit to Stand;Stand to [employer] Transfers    Sit to Stand 5: Supervision;With upper extremity assist;With armrests;From chair/3-in-1;4: Min assist   SBA to 2 forearm crutches, minA to 1 crutch   Sit to Stand Details Visual cues for safe use of DME/AE;Verbal cues for precautions/safety;Verbal cues for technique    Stand to Sit 5: Supervision;With upper extremity assist;With armrests;To chair/3-in-1;4: Min guard   supervision from 2 forearm crutches, min  guard from 1 crutch   Stand to Sit Details (indicate cue type and reason) Visual cues for safe use of DME/AE;Verbal cues for  precautions/safety;Verbal cues for technique      Ambulation/Gait   Ambulation/Gait Yes    Ambulation/Gait Assistance 5: Supervision;4: Min assist;3: Mod assist   MinA straight & modA neg single crutch, SBA 2 crutches   Ambulation/Gait Assistance Details see DGI for gait with 2 crutches,  manual assist for balance with single crutch esp. negotiating obstacles.     Ambulation Distance (Feet) 350 Feet   350' 2 crutches, 15' X 2 (2nd around obstacles) w/1 crutch   Assistive device Prosthesis;Lofstrands;Rollator;L Forearm Crutch   enter/ exit with rollator   Gait Pattern Step-through pattern;Decreased arm swing - left;Decreased step length - right;Decreased stance time - left;Antalgic;Trunk flexed    Ambulation Surface Level;Indoor      Therapeutic Activites    Other Therapeutic Activities --      Neuro Re-ed    Neuro Re-ed Details  --      Knee/Hip Exercises: Aerobic   Nustep level 5 with BUEs & BLEs 8 minutes PT cues on technique & full motion    PT provided patient education while patient using Nustep.       Prosthetics   Prosthetic Care Comments  --    Current prosthetic wear tolerance (days/week)  daily    Current prosthetic wear tolerance (#hours/day)  prosthesis most of awake hours drying 4x/day    Residual limb condition  wound on patella healed.      Education Provided Other (comment)                    PT Short Term Goals - 12/18/19 1732      PT SHORT TERM GOAL #1   Title Patient demonstrates and verbalizes understanding of updated HEP.   (All STGs Target Date: 01/15/2020)    Time 4    Period Weeks    Status New    Target Date 01/15/20      PT SHORT TERM GOAL #2   Title Patient verbalizes understanding signs & management of sweating with prosthetic wear & use.    Time 4    Period Weeks    Status New    Target Date 01/15/20      PT SHORT TERM GOAL #3   Title Timed Up & Go with rollator walker, AFO & prosthesis <20 seconds.    Time 4    Period Weeks     Status New    Target Date 01/15/20      PT SHORT TERM GOAL #4   Title Patient ambulates 54' around furniture / obstacles with single forearm crutches & prosthesis with minimal guard.    Time 4    Period Weeks    Status New    Target Date 01/15/20      PT SHORT TERM GOAL #5   Title Patient negotiates ramps & curbs with 2 forearm crutches, AFO & prosthesis with supervision.    Time 4    Period Weeks    Status Revised    Target Date 01/15/20             PT Long Term Goals - 12/18/19 1739      PT LONG TERM GOAL #1   Title Patient verbalizes & demonstrates proper prosthetic care to enable safe utilization of prosthesis.  (All LTGs Target Date 12/19/2019)    Baseline Progressing but not fully met 12/16/2019  Patient has basic understanding of prosthetic care but needs PT cues to problem solve issues    Time 3    Period Months    Status Not Met      PT LONG TERM GOAL #2   Title Patient tolerates prosthesis wear & right ankle orthosis wear >90% of awake hours to enable function throughout her day.    Baseline MET 12/16/2019    Time 3    Period Months    Status Achieved      PT LONG TERM GOAL #3   Title Standing balance: static without UE support eyes open for 30 seconds and dynamic with UE support reaching 10", pick up object from floor, managing clothes & static eyes closed 30 seconds safely modified independent.    Baseline Progressing but not met 12/16/2019  Static balance 38seconds with close supervision.  Standing with single crutch: reaching 5" and to knee level with close supervision; closes eyes and balance loss requiring assistance to prevent fall.    Time 3    Period Months    Status Not Met      PT LONG TERM GOAL #4   Title Patient ambulates 300' including grass with 2 forearm crutches or rollator walker and prosthesisAFO modified independent for community access.    Baseline Partially MET 12/18/2019  Patient ambulates 350' indoor surfaces with 2 forearm crutches with  supervision and with rollator walker modified independent.    Time 3    Period Months    Status Partially Met      PT LONG TERM GOAL #5   Title Patient negotiates ramps & curbs with 2 forearm crutches or rollator walker & prosthesis modified independent for community access.    Baseline Partially Met 12/18/2019  Patient negotiates ramps & curbs with 2 crutches with min guard & with rollator walker modified independent.    Time 3    Period Months    Status Partially Met      PT LONG TERM GOAL #6   Title Patient ambulates 100' around furniture carrying item in free UE with single forearm crutch, prosthesis & AFO modified independent.    Baseline NOT MET 12/18/2019  Patient ambulates 20' with single crutch, AFO & prosthesis with minA straight path & [place] negotiating furniture or obstacles.    Time 4    Period Weeks    Status Not Met              PT Long Term Goals - 12/18/19 1759      PT LONG TERM GOAL #1   Title Patient verbalizes & demonstrates proper prosthetic care and tolerates wear >90% of awake hours without skin issues to enable safe utilization of prosthesis.  (All LTGs Target Date 12/19/2019)    Time 12    Period Weeks    Status Revised    Target Date 03/11/20      PT LONG TERM GOAL #2   Title Tasks of Berg Balance Test with single forearm crutch support >36/56 indicating improved function with standing ADLs    Time 12    Period Weeks    Status New    Target Date 03/11/20      PT LONG TERM GOAL #3   Title Patient ambulates 500' with rollator walker or 2 forearm crutches with 50' on grass with AFO & prosthesis modified independent to enable community mobility.    Time 12    Period Weeks    Status Revised    Target Date  03/11/20      PT LONG TERM GOAL #4   Title Dynamic Gait Index with 2 forearm crutches >/= 14/24    Time 12    Period Weeks    Status New    Target Date 03/11/20      PT LONG TERM GOAL #5   Title Patient negotiates ramps & curbs with 2 forearm  crutches or rollator walker & prosthesis modified independent for community access.    Time 12    Period Weeks    Status On-going    Target Date 03/11/20      PT LONG TERM GOAL #6   Title Patient ambulates 46' around furniture carrying item in free UE with single forearm crutch, prosthesis & AFO modified independent.    Time 12    Period Weeks    Status Revised    Target Date 03/11/20      PT LONG TERM GOAL #7   Title Timed Up & Go with single forearm crutch <35 seconds.    Time 12    Period Weeks    Status New    Target Date 03/11/20                Plan - 12/18/19 1109    Clinical Impression Statement Patient partially met 3 of 6 long term goals and progressed with other 3 LTGs. She is wearing prosthesis most of her awake hours. She developed a wound over patella but it healed ~1 week ago.  Patient's balance improved but is still at high risk for falls.Tasks of [employer] Test performed single forearm crutch 21/56 indicates limitations with standing ADLs. Dynamic Gait Index with 2 forerm crutches 7/24 indicates limitations with community gait.  PT is working with patient on ability to ambulate in community with either rollator walker or bilateral forearm crutches (better on grass or gravel surfaces) and household with single forearm crutch to enable ability to carry items.  Patient has potential to function at higher level with further PT.    Personal Factors and Comorbidities Comorbidity 3+;Fitness;Time since onset of injury/illness/exacerbation    Comorbidities L TTA, DM, CVA, sleep apnea, PNA, neuropathy bil. feet and hand, HTN, s/p cervical fusion, s/p back surgery, IBS, asthma, Covid in Jan 2021    Examination-Activity Limitations Hygiene/Grooming;Locomotion Level;Reach Overhead;Stairs;Stand;Transfers    Examination-Participation Restrictions Recruitment consultant Evolving/Moderate complexity    Rehab Potential Good    PT  Frequency 2x / week    PT Duration 12 weeks    PT Treatment/Interventions ADLs/Self Care Home Management;DME Instruction;Gait training;Stair training;Functional mobility training;Therapeutic activities;Therapeutic exercise;Balance training;Neuromuscular re-education;Patient/family education;Orthotic Fit/Training;Prosthetic Training;Vestibular;Manual techniques    PT Next Visit Plan work towards updated STGs, balance & gait activities, therapuetic exercise    PT Home Exercise Plan Access Code: [email or web address]    at sink for mini squats with UE support, weight shifts with UE support, and balance feet apart no Ue support all with chair behind her    Consulted and Agree with Plan of Care Patient           Patient will benefit from skilled therapeutic intervention in order to improve the following deficits and impairments:  Abnormal gait, Decreased activity tolerance, Decreased balance, Decreased endurance, Decreased knowledge of use of DME, Decreased mobility, Decreased range of motion, Decreased safety awareness, Decreased strength, Increased edema, Impaired flexibility, Impaired sensation, Postural dysfunction, Prosthetic Dependency, Obesity, Pain  Visit Diagnosis: Unsteadiness on feet  Other abnormalities of gait and mobility  Abnormal  posture  History of fall  Muscle weakness (generalized)  Stiffness of left knee, not elsewhere classified     Problem List Patient Active Problem List   Diagnosis Date Noted  . Allergy to statin medication 12/01/2019  . History of below knee amputation, left ([place]) 07/30/2019  . Gangrene of left foot ([place]) 05/16/2019  . Subacute osteomyelitis of left foot ([place])   . Charcot's joint of foot, left   . Post-operative state   . PAD (peripheral artery disease) ([place]) 12/05/2018  . Osteomyelitis of ankle or foot, acute, left ([place]) 12/05/2018  . MRSA bacteremia 12/05/2018  . Sepsis without acute organ dysfunction ([place])   . Charcot's joint of foot due to  diabetes ([place]) 09/18/2018  . BMI 36.0-36.9,adult   . Morbid obesity ([place]) 11/19/2017  . B12 deficiency 11/01/2017  . Diabetic foot ulcer ([place]) 12/14/2016  . Diabetic polyneuropathy associated with type 2 diabetes mellitus ([place]) 12/13/2015  . Lumbar stenosis with neurogenic claudication 09/02/2015  . Chronic constipation 07/13/2015  . OSA (obstructive sleep apnea) 06/25/2015  . 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/23/2015  . Hypertriglyceridemia 04/23/2015  . Asthma, mild intermittent 02/01/2015  . Type 2 diabetes mellitus with diabetic nephropathy ([place]) 12/31/2014  . Gastroesophageal reflux disease with esophagitis 12/31/2014  . Dysphagia 12/31/2014  . Bilateral carpal tunnel syndrome 12/01/2014  . Diverticulosis of colon (without mention of hemorrhage) 11/30/2012  . Essential hypertension, benign 11/28/2012    Jamey Reas PT, DPT 12/18/2019, 5:55 PM  [facility] [address] [place], [place], [address] Phone: [phone]   Fax:  [phone]  Name: Olivia Romero MRN: [id number] Date of Birth: 1955/12/09

## 2019-12-19 ENCOUNTER — Telehealth (INDEPENDENT_AMBULATORY_CARE_PROVIDER_SITE_OTHER): Payer: Medicare Other | Admitting: Family Medicine

## 2019-12-19 ENCOUNTER — Encounter: Payer: Self-pay | Admitting: Family Medicine

## 2019-12-19 VITALS — Ht 60.0 in | Wt 187.0 lb

## 2019-12-19 DIAGNOSIS — Z91012 Allergy to eggs: Secondary | ICD-10-CM | POA: Diagnosis not present

## 2019-12-19 DIAGNOSIS — Z7185 Encounter for immunization safety counseling: Secondary | ICD-10-CM

## 2019-12-19 DIAGNOSIS — Z7189 Other specified counseling: Secondary | ICD-10-CM | POA: Diagnosis not present

## 2019-12-19 NOTE — Progress Notes (Signed)
Name: Olivia Romero   MRN: [id number]    DOB: 15-Nov-1955   Date:12/19/2019       Progress Note  Subjective:    Chief Complaint  Chief Complaint  Patient presents with   Vacccine questions    COVID-19    I connected with  [name]  on 12/19/19 at 10:40 AM EDT by a video enabled telemedicine application and verified that I am speaking with the correct person using two identifiers.  I discussed the limitations of evaluation and management by telemedicine and the availability of in person appointments. The patient expressed understanding and agreed to proceed. Staff also discussed with the patient that there may be a patient responsible charge related to this service. Patient Location: home Provider Location: [facility] clinic Additional Individuals present: none  HPI     COVID vaccine concerns and questions She got COVID Dec 2020 while in rehab at [facility]   With flu shot and egg allergy she has experienced local swelling at injection site, she reports no flu shot since she was a child due to this.  She reports that Egg allergy causes swelling to lips/airway - has resolved with benadryl, has never been hospitalized, use an EpiPen.  She is concerned about this allergy and whether or not she should proceed with getting the Covid vaccination. She has no known allergies to vaccine ingredients or components  Per chart she has gotten a Tdap, flu shot in 2017 and pneumococcal 23 in 2015         Patient Active Problem List   Diagnosis Date Noted   Allergy to statin medication 12/01/2019   History of below knee amputation, left ([place]) 07/30/2019   Gangrene of left foot ([place]) 05/16/2019   Subacute osteomyelitis of left foot ([place])    Charcot's joint of foot, left    Post-operative state    PAD (peripheral artery disease) ([place]) 12/05/2018   Osteomyelitis of ankle or foot, acute, left ([place]) 12/05/2018   MRSA bacteremia 12/05/2018   Sepsis without acute organ  dysfunction ([place])    Charcot's joint of foot due to diabetes ([place]) 09/18/2018   BMI 36.0-36.9,adult    Morbid obesity ([place]) 11/19/2017   B12 deficiency 11/01/2017   Diabetic foot ulcer ([place]) 12/14/2016   Diabetic polyneuropathy associated with type 2 diabetes mellitus ([place]) 12/13/2015   Lumbar stenosis with neurogenic claudication 09/02/2015   Chronic constipation 07/13/2015   OSA (obstructive sleep apnea) 06/25/2015   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/23/2015   Hypertriglyceridemia 04/23/2015   Asthma, mild intermittent 02/01/2015   Type 2 diabetes mellitus with diabetic nephropathy ([place]) 12/31/2014   Gastroesophageal reflux disease with esophagitis 12/31/2014   Dysphagia 12/31/2014   Bilateral carpal tunnel syndrome 12/01/2014   Diverticulosis of colon (without mention of hemorrhage) 11/30/2012   Essential hypertension, benign 11/28/2012    Social History   Tobacco Use   Smoking status: Never Smoker   Smokeless tobacco: Never Used  Substance Use Topics   Alcohol use: Never     Current Outpatient Medications:    acetaminophen (TYLENOL) 500 MG tablet, Take 1,000 mg by mouth every 6 (six) hours as needed for mild pain. Maximum of 3,000 mg per day, Disp: , Rfl:    Albuterol Sulfate (PROAIR RESPICLICK) 269 (90 Base) MCG/ACT AEPB, Inhale 1-2 puffs into the lungs every 6 (six) hours as needed (Shortness of breath or wheezing)., Disp: 1 each, Rfl: 2   aspirin EC 325 MG tablet, Take 1 tablet (325 mg total)  by mouth daily., Disp: 30 tablet, Rfl: 0   Blood Glucose Monitoring Suppl (CONTOUR NEXT MONITOR) w/Device KIT, as directed. , Disp: , Rfl:    Chlorphen-Phenyleph-ASA (ALKA-SELTZER PLUS COLD PO), Take 2 tablets by mouth as needed (allergies). Day and night time, Disp: , Rfl:    DULoxetine (CYMBALTA) 60 MG capsule, Take 60 mg by mouth at bedtime., Disp: , Rfl:    fenofibrate (TRICOR) 145 MG tablet, Take 1 tablet (145 mg total) by mouth  daily., Disp: 90 tablet, Rfl: 3   gabapentin (NEURONTIN) 400 MG capsule, Take 800 mg by mouth 3 (three) times daily. , Disp: , Rfl:    glimepiride (AMARYL) 2 MG tablet, Take 2 mg by mouth 2 (two) times daily. , Disp: , Rfl:    hydrochlorothiazide (HYDRODIURIL) 12.5 MG tablet, Take 1 tablet (12.5 mg total) by mouth daily., Disp: 90 tablet, Rfl: 3   Incontinence Supply Disposable (ASSURANCE FITTED BRIEF LARGE) MISC, Use 1 brief up to 6 times a day as needed for urinary incontinence, Disp: 180 each, Rfl: 5   Insulin Degludec (TRESIBA FLEXTOUCH) 200 UNIT/ML SOPN, Inject 20 Units into the skin at bedtime. , Disp: , Rfl:    metFORMIN (GLUCOPHAGE-XR) 500 MG 24 hr tablet, Take 500 mg by mouth 3 (three) times daily. , Disp: , Rfl:    pantoprazole (PROTONIX) 40 MG tablet, Take 1 tablet (40 mg total) by mouth daily as needed (Heartburn)., Disp: 90 tablet, Rfl: 2   ULTICARE SHORT PEN NEEDLES 31G X 8 MM MISC, , Disp: , Rfl:   Allergies  Allergen Reactions   Eggs Or Egg-Derived Products Swelling and Other (See Comments)    Angioedema   Atorvastatin Other (See Comments)    Liver toxicity   Flu Virus Vaccine Swelling    Arm swelled (site of injection)   Latex Itching   Pravastatin Itching and Rash   Tape Rash and Other (See Comments)    Adhesive on foot pull off skin.  Ok to use paper tape.    I personally reviewed active problem list, medication list, allergies, family history, social history, health maintenance, notes from last encounter, lab results, imaging with the patient/caregiver today.   Review of Systems  10 Systems reviewed and are negative for acute change except as noted in the HPI.   Objective:   Virtual encounter, vitals limited, only able to obtain the following Today's Vitals   12/19/19 0910  Weight: 187 lb (84.8 kg)  Height: 5' (1.524 m)  PainSc: 0-No pain   Body mass index is 36.52 kg/m. Nursing Note and Vital Signs reviewed.  Physical Exam Pleasant,  alert, phonation clear PE limited by telephone encounter  No results found for this or any previous visit (from the past 72 hour(s)).  Assessment and Plan:     ICD-10-CM   1. Vaccine counseling  Z71.89    reviewed contraindications, vaccine ingredients, pt fairly high risk to COVID - I do not see reason why she should not get COVID vaccine - offered referal  2. Allergy history, eggs  Z91.012    per pt report, concerns about COVID vaccinations     Offered referral to allergy specialists to help pt further Lengthy discussion with pt today, reviewed current contraindication reviewed vaccine ingredients.  Her history of egg allergy and allergic reaction to flu shot is slightly ambiguous because described reaction is consistent with normal localized side effects of many vaccines.  I did offer to research what else I could further provide that information  on MyChart.  Offered referral at this time but she declined.  -Red flags and when to present for emergency care or RTC including fever >101.2F, chest pain, shortness of breath, new/worsening/un-resolving symptoms, reviewed with patient at time of visit. Follow up and care instructions discussed and provided in AVS. - I discussed the assessment and treatment plan with the patient. The patient was provided an opportunity to ask questions and all were answered. The patient agreed with the plan and demonstrated an understanding of the instructions.  I provided 20+ minutes of non-face-to-face time during this encounter.  Delsa Grana, PA-C 12/19/19 10:35 AM

## 2019-12-22 ENCOUNTER — Ambulatory Visit (INDEPENDENT_AMBULATORY_CARE_PROVIDER_SITE_OTHER): Payer: Medicare Other | Admitting: Urology

## 2019-12-22 ENCOUNTER — Other Ambulatory Visit: Payer: Self-pay

## 2019-12-22 ENCOUNTER — Ambulatory Visit: Payer: Self-pay | Admitting: Urology

## 2019-12-22 ENCOUNTER — Encounter: Payer: Self-pay | Admitting: Urology

## 2019-12-22 VITALS — BP 153/77 | HR 106 | Ht 60.0 in | Wt 199.2 lb

## 2019-12-22 DIAGNOSIS — N3946 Mixed incontinence: Secondary | ICD-10-CM | POA: Diagnosis not present

## 2019-12-22 DIAGNOSIS — R319 Hematuria, unspecified: Secondary | ICD-10-CM

## 2019-12-22 LAB — URINALYSIS, COMPLETE
Bilirubin, UA: NEGATIVE
Glucose, UA: NEGATIVE
Ketones, UA: NEGATIVE
Leukocytes,UA: NEGATIVE
Nitrite, UA: NEGATIVE
Protein,UA: NEGATIVE
RBC, UA: NEGATIVE
Specific Gravity, UA: >1.03 — ABNORMAL HIGH (ref 1.005–1.030)
Urobilinogen, Ur: 0.2 mg/dL (ref 0.2–1.0)
pH, UA: 5.5 (ref 5.0–7.5)

## 2019-12-22 LAB — MICROSCOPIC EXAMINATION

## 2019-12-22 MED ORDER — MIRABEGRON ER 50 MG PO TB24
50.0000 mg | ORAL_TABLET | Freq: Every day | ORAL | 11 refills | Status: DC
Start: 2019-12-22 — End: 2019-12-22

## 2019-12-22 MED ORDER — MIRABEGRON ER 50 MG PO TB24
50.0000 mg | ORAL_TABLET | Freq: Every day | ORAL | 11 refills | Status: DC
Start: 2019-12-22 — End: 2020-07-27

## 2019-12-22 NOTE — Progress Notes (Signed)
12/22/2019 10:15 AM   Olivia Romero [birth year] [id number]  Referring provider: [name], [place] [place] [address] [place] [place],  [place] [id number]  Chief Complaint  Patient presents with  . Cysto    HPI: I was consulted to assess the patient's urinary incontinence.  I believe her primary symptom is urge incontinence and she can soak 3 or 4 pads a day.  She sometimes leaks with coughing sneezing but not bending lifting.  No bedwetting.  She time voids every 60 to 90 minutes and gets up twice at night.  She has had a TIA.  She has had neck and lower back surgery.  She is an insulin-dependent diabetic.  She has had a hysterectomy  Patient has mixed incontinence but primarily urge incontinence.  I will evaluate the patient in 8 weeks on Gemtesa and Mybetriq samples for for cystoscopy.  She may need urodynamics in the future  Day Patient took the Myrbetriq and was 80% or greater better.  Greatly reduced frequency and urge incontinence.  The new beta 3 agent helped but less so.  Clinically not infected On pelvic examination patient had a well supported bladder neck with no prolapse or stress incontinence  Patient underwent flexible cystoscopy.  Bladder mucosa and trigone were normal.  No stitch foreign body or carcinoma.  Few white flecks in the urine and urine was sent for culture.  She did have mild diffuse changes in keeping with some cystitis cystica.  Tolerated procedure well    PMH: Past Medical History:  Diagnosis Date  . Anemia   . Arthritis   . Asthma   . Colon polyps    adenomatous  . Diabetes mellitus without complication ([place])   . Diabetic infection of left foot ([place]) 12/2018  . Diverticulosis of colon   . Esophagitis   . GERD (gastroesophageal reflux disease)   . Hemorrhoid    internal  . Hyperlipemia   . Hypertension   . IBS (irritable bowel syndrome)    no current prob - diet controlled  . Myalgia due to statin 11/19/2017  . Neuropathy   .  Neuropathy of both feet   . Neuropathy of hand   . Pneumonia    x 4  . PONV (postoperative nausea and vomiting)    on some surgeries but not all procedures  . Skin ulcer of right ankle, limited to breakdown of skin ([place]) 12/31/2016   resolved per patient 05/14/19  . Sleep apnea    has had in the past lost 50 pounds and do longer uses cpap  . Statin intolerance 04/23/2015  . Stroke [facility]) 2009   mini stroke per patient  . Stroke-like episode 2009   TIA - mini stroke per patient  . 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 ([place]) 07/13/2015  . Uncontrolled type 2 diabetes mellitus with hyperglycemia ([place]) 11/01/2017    Surgical History: Past Surgical History:  Procedure Laterality Date  . ABDOMINAL HYSTERECTOMY    . AMPUTATION Left 12/06/2018   Procedure: PARTIAL AMPUTATION LEFT FOOT;  Surgeon: Edrick Kins, DPM;  Location: [place];  Service: Podiatry;  Laterality: Left;  . AMPUTATION Left 05/16/2019   Procedure: LEFT BELOW KNEE AMPUTATION;  Surgeon: Newt Minion, MD;  Location: [place];  Service: Orthopedics;  Laterality: Left;  . BACK SURGERY  09/02/2015  . BONE BIOPSY Left 12/06/2018   Procedure: Bone Biopsy;  Surgeon: Edrick Kins, DPM;  Location: [place];  Service: Podiatry;  Laterality: Left;  .  CERVICAL FUSION    . CESAREAN SECTION  1986  . COLONOSCOPY N/A 11/30/2012   Procedure: COLONOSCOPY;  Surgeon: Irene Shipper, MD;  Location: [facility] ENDOSCOPY;  Service: Endoscopy;  Laterality: N/A;  . COLONOSCOPY WITH PROPOFOL N/A 01/03/2016   Procedure: COLONOSCOPY WITH PROPOFOL;  Surgeon: Manya Silvas, MD;  Location[facility] ENDOSCOPY;  Service: Endoscopy;  Laterality: N/A;  . ESOPHAGOGASTRODUODENOSCOPY (EGD) WITH PROPOFOL N/A 01/14/2015   Procedure: ESOPHAGOGASTRODUODENOSCOPY (EGD) WITH PROPOFOL;  Surgeon: Manya Silvas, MD;  [facility];  Service: Endoscopy;  Laterality: N/A;  . IRRIGATION AND DEBRIDEMENT FOOT Left 03/13/2018   Procedure: IRRIGATION AND DEBRIDEMENT  FOOT WITH BONE BIOPSY WITH MISONIX DEBRIDER;  Surgeon: Evelina Bucy, DPM;  Location: [place];  Service: Podiatry;  Laterality: Left;  . IRRIGATION AND DEBRIDEMENT FOOT Left 12/06/2018   Procedure: Irrigation And Debridement Foot;  Surgeon: Edrick Kins, DPM;  Location: [place];  Service: Podiatry;  Laterality: Left;  . LEG AMPUTATION BELOW KNEE Left   . LUMBAR WOUND DEBRIDEMENT N/A 10/01/2015   Procedure: LUMBAR WOUND DEBRIDEMENT;  Surgeon: Ashok Pall, MD;  Location: [place] NEURO ORS;  Service: Neurosurgery;  Laterality: N/A;  LUMBAR WOUND DEBRIDEMENT  . NASAL SINUS SURGERY    . SAVORY DILATION N/A 01/14/2015   Procedure: SAVORY DILATION;  Surgeon: Manya Silvas, MD;  Location: [facility] ENDOSCOPY;  Service: Endoscopy;  Laterality: N/A;  . TONSILLECTOMY    . WISDOM TOOTH EXTRACTION      Home Medications:  Allergies as of 12/22/2019      Reactions   Eggs Or Egg-derived Products Swelling, Other (See Comments)   Angioedema   Atorvastatin Other (See Comments)   Liver toxicity   Flu Virus Vaccine Swelling   Arm swelled (site of injection)   Latex Itching   Pravastatin Itching, Rash   Tape Rash, Other (See Comments)   Adhesive on foot pull off skin.  Ok to use paper tape.      Medication List       Accurate as of December 22, 2019 10:15 AM. If you have any questions, ask your nurse or doctor.        acetaminophen 500 MG tablet Commonly known as: TYLENOL Take 1,000 mg by mouth every 6 (six) hours as needed for mild pain. Maximum of 3,000 mg per day   Albuterol Sulfate 108 (90 Base) MCG/ACT Aepb Commonly known as: ProAir RespiClick Inhale 1-2 puffs into the lungs every 6 (six) hours as needed (Shortness of breath or wheezing).   ALKA-SELTZER PLUS COLD PO Take 2 tablets by mouth as needed (allergies). Day and night time   aspirin EC 325 MG tablet Take 1 tablet (325 mg total) by mouth daily.   Assurance Fitted Brief Large Misc Use 1 brief up to 6 times a day as needed for urinary  incontinence   Contour Next Monitor w/Device Kit as directed.   DULoxetine 60 MG capsule Commonly known as: CYMBALTA Take 60 mg by mouth at bedtime.   fenofibrate 145 MG tablet Commonly known as: TRICOR Take 1 tablet (145 mg total) by mouth daily.   gabapentin 400 MG capsule Commonly known as: NEURONTIN Take 800 mg by mouth 3 (three) times daily.   glimepiride 2 MG tablet Commonly known as: AMARYL Take 2 mg by mouth 2 (two) times daily.   hydrochlorothiazide 12.5 MG tablet Commonly known as: HYDRODIURIL Take 1 tablet (12.5 mg total) by mouth daily.   metFORMIN 500 MG 24 hr tablet Commonly known as: GLUCOPHAGE-XR  Take 500 mg by mouth 3 (three) times daily.   pantoprazole 40 MG tablet Commonly known as: PROTONIX Take 1 tablet (40 mg total) by mouth daily as needed (Heartburn).   Tyler Aas FlexTouch 200 UNIT/ML FlexTouch Pen Generic drug: insulin degludec Inject 20 Units into the skin at bedtime.   UltiCare Short Pen Needles 31G X 8 MM Misc Generic drug: Insulin Pen Needle       Allergies:  Allergies  Allergen Reactions  . Eggs Or Egg-Derived Products Swelling and Other (See Comments)    Angioedema  . Atorvastatin Other (See Comments)    Liver toxicity  . Flu Virus Vaccine Swelling    Arm swelled (site of injection)  . Latex Itching  . Pravastatin Itching and Rash  . Tape Rash and Other (See Comments)    Adhesive on foot pull off skin.  Ok to use paper tape.    Family History: Family History  Problem Relation Age of Onset  . Lung cancer Father   . Hypertension Father   . Arthritis Father   . Other Mother        hardening of the arteries/renal cell carcenoma  . Hypertension Mother   . Stroke Mother   . Kidney cancer Mother   . Arthritis Brother   . Rheum arthritis Maternal Uncle   . Bladder Cancer Neg Hx     Social History:  reports that she has never smoked. She has never used smokeless tobacco. She reports that she does not drink alcohol and does  not use drugs.  ROS:                                        Physical Exam: BP (!) 153/77 (BP Location: Left Arm, Patient Position: Sitting, Cuff Size: Normal)   Pulse (!) 106   Ht 5' (1.524 m)   Wt 199 lb 3.2 oz (90.4 kg)   LMP  (LMP Unknown)   BMI 38.90 kg/m     Laboratory Data: Lab Results  Component Value Date   WBC 8.4 05/16/2019   HGB 11.2 (L) 05/16/2019   HCT 36.1 05/16/2019   MCV 83.0 05/16/2019   PLT 310 05/16/2019    Lab Results  Component Value Date   CREATININE 0.84 05/16/2019    No results found for: PSA  No results found for: TESTOSTERONE  Lab Results  Component Value Date   HGBA1C 7.3 (H) 05/16/2019    Urinalysis    Component Value Date/Time   COLORURINE YELLOW 12/04/2018 1707   APPEARANCEUR Clear 10/20/2019 1108   LABSPEC 1.022 12/04/2018 1707   PHURINE 5.0 12/04/2018 1707   GLUCOSEU Negative 10/20/2019 1108   HGBUR NEGATIVE 12/04/2018 1707   BILIRUBINUR Negative 10/20/2019 1108   KETONESUR NEGATIVE 12/04/2018 1707   PROTEINUR Negative 10/20/2019 1108   PROTEINUR NEGATIVE 12/04/2018 1707   UROBILINOGEN 0.2 09/30/2019 1128   NITRITE Negative 10/20/2019 1108   NITRITE NEGATIVE 12/04/2018 1707   LEUKOCYTESUR Negative 10/20/2019 1108   LEUKOCYTESUR NEGATIVE 12/04/2018 1707    Pertinent Imaging:   Assessment & Plan: Clinically patient has overactive bladder.  Reassess in 4 months on Myrbetriq samples and prescription 50 mg  1. Hematuria, unspecified type  - Urinalysis, Complete   No follow-ups on file.  Reece Packer, MD  [place] [address] [place], [place] [address] [phone]

## 2019-12-23 ENCOUNTER — Encounter: Payer: Self-pay | Admitting: Physical Therapy

## 2019-12-23 ENCOUNTER — Ambulatory Visit (INDEPENDENT_AMBULATORY_CARE_PROVIDER_SITE_OTHER): Payer: Medicare Other | Admitting: Physical Therapy

## 2019-12-23 ENCOUNTER — Ambulatory Visit: Payer: Medicare Other | Admitting: Podiatry

## 2019-12-23 DIAGNOSIS — Z9181 History of falling: Secondary | ICD-10-CM | POA: Diagnosis not present

## 2019-12-23 DIAGNOSIS — M6281 Muscle weakness (generalized): Secondary | ICD-10-CM

## 2019-12-23 DIAGNOSIS — R2689 Other abnormalities of gait and mobility: Secondary | ICD-10-CM

## 2019-12-23 DIAGNOSIS — R2681 Unsteadiness on feet: Secondary | ICD-10-CM | POA: Diagnosis not present

## 2019-12-23 DIAGNOSIS — R293 Abnormal posture: Secondary | ICD-10-CM

## 2019-12-23 DIAGNOSIS — M25662 Stiffness of left knee, not elsewhere classified: Secondary | ICD-10-CM

## 2019-12-23 NOTE — Therapy (Signed)
[facility] [address] [place], [place], [address] Phone: [phone]   Fax:  [phone]  Physical Therapy Treatment  Patient Details  Name: Olivia Romero MRN: [id number] Date of Birth: Jul 28, 1955 Referring Provider (PT): Meridee Score, MD   Encounter Date: 12/23/2019   PT End of Session - 12/23/19 1630    Visit Number 23    Number of Visits 58    Date for PT Re-Evaluation 03/11/20    Authorization Type [employer] Medicare, $30 co-pay, no visit limit    Progress Note Due on Visit 30    PT Start Time 1109    PT Stop Time 1150    PT Time Calculation (min) 41 min    Equipment Utilized During Treatment Gait belt    Activity Tolerance Patient tolerated treatment well    Behavior During Therapy [facility] for tasks assessed/performed           Past Medical History:  Diagnosis Date  . Anemia   . Arthritis   . Asthma   . Colon polyps    adenomatous  . Diabetes mellitus without complication ([place])   . Diabetic infection of left foot ([place]) 12/2018  . Diverticulosis of colon   . Esophagitis   . GERD (gastroesophageal reflux disease)   . Hemorrhoid    internal  . Hyperlipemia   . Hypertension   . IBS (irritable bowel syndrome)    no current prob - diet controlled  . Myalgia due to statin 11/19/2017  . Neuropathy   . Neuropathy of both feet   . Neuropathy of hand   . Pneumonia    x 4  . PONV (postoperative nausea and vomiting)    on some surgeries but not all procedures  . Skin ulcer of right ankle, limited to breakdown of skin ([place]) 12/31/2016   resolved per patient 05/14/19  . Sleep apnea    has had in the past lost 50 pounds and do longer uses cpap  . Statin intolerance 04/23/2015  . Stroke [facility]) 2009   mini stroke per patient  . Stroke-like episode 2009   TIA - mini stroke per patient  . 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 ([place]) 07/13/2015  . Uncontrolled type 2 diabetes mellitus with hyperglycemia ([place]) 11/01/2017     Past Surgical History:  Procedure Laterality Date  . ABDOMINAL HYSTERECTOMY    . AMPUTATION Left 12/06/2018   Procedure: PARTIAL AMPUTATION LEFT FOOT;  Surgeon: Edrick Kins, DPM;  Location: [place];  Service: Podiatry;  Laterality: Left;  . AMPUTATION Left 05/16/2019   Procedure: LEFT BELOW KNEE AMPUTATION;  Surgeon: Newt Minion, MD;  Location: [place];  Service: Orthopedics;  Laterality: Left;  . BACK SURGERY  09/02/2015  . BONE BIOPSY Left 12/06/2018   Procedure: Bone Biopsy;  Surgeon: Edrick Kins, DPM;  Location: [place];  Service: Podiatry;  Laterality: Left;  . CERVICAL FUSION    . CESAREAN SECTION  1986  . COLONOSCOPY N/A 11/30/2012   Procedure: COLONOSCOPY;  Surgeon: Irene Shipper, MD;  Location: [facility] ENDOSCOPY;  Service: Endoscopy;  Laterality: N/A;  . COLONOSCOPY WITH PROPOFOL N/A 01/03/2016   Procedure: COLONOSCOPY WITH PROPOFOL;  Surgeon: Manya Silvas, MD;  Location: [facility]COPY;  Service: Endoscopy;  Laterality: N/A;  . ESOPHAGOGASTRODUODENOSCOPY (EGD) WITH PROPOFOL N/A 01/14/2015   Procedure: ESOPHAGOGASTRODUODENOSCOPY (EGD) WITH PROPOFOL;  Surgeon: Manya Silvas, MD;  Location: [facility] ENDOSCOPY;  Service: Endoscopy;  Laterality: N/A;  . IRRIGATION AND DEBRIDEMENT FOOT Left 03/13/2018  Procedure: IRRIGATION AND DEBRIDEMENT FOOT WITH BONE BIOPSY WITH MISONIX DEBRIDER;  Surgeon: Evelina Bucy, DPM;  Location: [place];  Service: Podiatry;  Laterality: Left;  . IRRIGATION AND DEBRIDEMENT FOOT Left 12/06/2018   Procedure: Irrigation And Debridement Foot;  Surgeon: Edrick Kins, DPM;  Location: [place];  Service: Podiatry;  Laterality: Left;  . LEG AMPUTATION BELOW KNEE Left   . LUMBAR WOUND DEBRIDEMENT N/A 10/01/2015   Procedure: LUMBAR WOUND DEBRIDEMENT;  Surgeon: Ashok Pall, MD;  Location: [place] NEURO ORS;  Service: Neurosurgery;  Laterality: N/A;  LUMBAR WOUND DEBRIDEMENT  . NASAL SINUS SURGERY    . SAVORY DILATION N/A 01/14/2015   Procedure: SAVORY DILATION;  Surgeon: Manya Silvas, MD;  Location: [facility] ENDOSCOPY;  Service: Endoscopy;  Laterality: N/A;  . TONSILLECTOMY    . WISDOM TOOTH EXTRACTION      There were no vitals filed for this visit.   Subjective Assessment - 12/23/19 1112    Subjective She walked further than usual and bottom of limb began to hurt.    Pertinent History L TTA, DM, CVA, sleep apnea, PNA, neuropathy bil. 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 No/denies    Pain Onset In the past 7 days                             [facility] Adult PT Treatment/Exercise - 12/23/19 1115      Transfers   Transfers Sit to Stand;Stand to Sit;Stand Pivot Transfers    Sit to Stand 5: Supervision;With upper extremity assist;With armrests;From chair/3-in-1;4: Min assist   SBA to 2 forearm crutches, minA to 1 crutch   Sit to Stand Details Visual cues for safe use of DME/AE;Verbal cues for precautions/safety;Verbal cues for technique    Stand to Sit 5: Supervision;With upper extremity assist;With armrests;To chair/3-in-1;4: Min guard   supervision from 2 forearm crutches, min guard from 1 crutch   Stand to Sit Details (indicate cue type and reason) Visual cues for safe use of DME/AE;Verbal cues for precautions/safety;Verbal cues for technique      Ambulation/Gait   Ambulation/Gait Yes    Ambulation/Gait Assistance 5: Supervision;4: Min assist   supervision 2 crutches & minA 1 crutch   Ambulation/Gait Assistance Details tactile & verbal cues on upright posture, step length with initial contact heel just past contralateral toes.      Ambulation Distance (Feet) 200 Feet   200' w/2 crutches & 20' X 2 with single crutch   Assistive device Prosthesis;Lofstrands;Rollator;L Forearm Crutch    enter/ exit with rollator   Gait Pattern Step-through pattern;Decreased arm swing - left;Decreased step length - right;Decreased stance time - left;Antalgic;Trunk flexed    Ambulation Surface Level;Indoor    Ramp 5: Supervision   Min guard w/2 forearm crutches, AFO & prosthesis   Ramp Details (indicate cue type and reason) verbal cues on weight shift, sequence & upright posture    Curb 5: Supervision   min guard w/2 forearm crutches, AFO & prosthesis   Curb Details (indicate cue type and reason) tactile & verbal cues on sequence & balance reactions      Neuro Re-ed    Neuro Re-ed Details  standing with LUE on  treadmill bar & RUE forearm crutch: stepping RLE & LLE over markers at toes for step through pattern.Paula Compton facing bar with stepping over yardstick lateral to foot with 90* turn & wt shift.       Knee/Hip Exercises: Machines for Strengthening   Cybex Knee Extension 10# BLEs 10 reps 2 sets Batca    Cybex Knee Flexion Batca seated hamstring curls 20# BLEs 10 reps 2 sets    Cybex Leg Press Shuttle leg press BLEs 87# 10 reps 2 sets      Prosthetics   Current prosthetic wear tolerance (days/week)  daily    Current prosthetic wear tolerance (#hours/day)  prosthesis most of awake hours drying 4x/day    Residual limb condition  wound on patella healed.      Education Provided Other (comment)                  PT Education - 12/23/19 1126    Education Details phantom sensation vs phantom pain vs residual limb pain, potential triggers & relief techniques    Person(s) Educated Patient    Methods Explanation;Verbal cues;Demonstration;Tactile cues    Comprehension Verbalized understanding            PT Short Term Goals - 12/18/19 1732      PT SHORT TERM GOAL #1   Title Patient demonstrates and verbalizes understanding of updated HEP.   (All STGs Target Date: 01/15/2020)    Time 4    Period Weeks    Status New    Target Date 01/15/20      PT SHORT TERM GOAL #2   Title  Patient verbalizes understanding signs & management of sweating with prosthetic wear & use.    Time 4    Period Weeks    Status New    Target Date 01/15/20      PT SHORT TERM GOAL #3   Title Timed Up & Go with rollator walker, AFO & prosthesis <20 seconds.    Time 4    Period Weeks    Status New    Target Date 01/15/20      PT SHORT TERM GOAL #4   Title Patient ambulates 50' around furniture / obstacles with single forearm crutches & prosthesis with minimal guard.    Time 4    Period Weeks    Status New    Target Date 01/15/20      PT SHORT TERM GOAL #5   Title Patient negotiates ramps & curbs with 2 forearm crutches, AFO & prosthesis with supervision.    Time 4    Period Weeks    Status Revised    Target Date 01/15/20             PT Long Term Goals - 12/18/19 1759      PT LONG TERM GOAL #1   Title Patient verbalizes & demonstrates proper prosthetic care and tolerates wear >90% of awake hours without skin issues to enable safe utilization of prosthesis.  (All LTGs Target Date 12/19/2019)    Time 12    Period Weeks    Status Revised    Target Date 03/11/20      PT LONG TERM GOAL #2   Title Tasks of [place] Balance Test with single forearm crutch support >36/56 indicating improved function with standing ADLs    Time 12    Period Weeks    Status New    Target Date 03/11/20      PT LONG TERM  GOAL #3   Title Patient ambulates 500' with rollator walker or 2 forearm crutches with 50' on grass with AFO & prosthesis modified independent to enable community mobility.    Time 12    Period Weeks    Status Revised    Target Date 03/11/20      PT LONG TERM GOAL #4   Title Dynamic Gait Index with 2 forearm crutches >/= 14/24    Time 12    Period Weeks    Status New    Target Date 03/11/20      PT LONG TERM GOAL #5   Title Patient negotiates ramps & curbs with 2 forearm crutches or rollator walker & prosthesis modified independent for community access.    Time 12     Period Weeks    Status On-going    Target Date 03/11/20      PT LONG TERM GOAL #6   Title Patient ambulates 42' around furniture carrying item in free UE with single forearm crutch, prosthesis & AFO modified independent.    Time 12    Period Weeks    Status Revised    Target Date 03/11/20      PT LONG TERM GOAL #7   Title Timed Up & Go with single forearm crutch <35 seconds.    Time 12    Period Weeks    Status New    Target Date 03/11/20                 Plan - 12/23/19 1632    Clinical Impression Statement PT worked on gait with visual & tactile cues for step through pattern and turning 90* with hip motion & wt shift. PT also worked on strengthening exercises with increased weights.    Personal Factors and Comorbidities Comorbidity 3+;Fitness;Time since onset of injury/illness/exacerbation    Comorbidities L TTA, DM, CVA, sleep apnea, PNA, neuropathy bil. feet and hand, HTN, s/p cervical fusion, s/p back surgery, IBS, asthma, Covid in Jan 2021    Examination-Activity Limitations Hygiene/Grooming;Locomotion Level;Reach Overhead;Stairs;Stand;Transfers    Examination-Participation Restrictions Recruitment consultant Evolving/Moderate complexity    Rehab Potential Good    PT Frequency 2x / week    PT Duration 12 weeks    PT Treatment/Interventions ADLs/Self Care Home Management;DME Instruction;Gait training;Stair training;Functional mobility training;Therapeutic activities;Therapeutic exercise;Balance training;Neuromuscular re-education;Patient/family education;Orthotic Fit/Training;Prosthetic Training;Vestibular;Manual techniques    PT Next Visit Plan work towards updated STGs, balance & gait activities, therapuetic exercise    PT Home Exercise Plan Access Code: [email or web address]    at sink for mini squats with UE support, weight shifts with UE support, and balance feet apart no Ue support all with chair behind her    Consulted and Agree with  Plan of Care Patient           Patient will benefit from skilled therapeutic intervention in order to improve the following deficits and impairments:  Abnormal gait, Decreased activity tolerance, Decreased balance, Decreased endurance, Decreased knowledge of use of DME, Decreased mobility, Decreased range of motion, Decreased safety awareness, Decreased strength, Increased edema, Impaired flexibility, Impaired sensation, Postural dysfunction, Prosthetic Dependency, Obesity, Pain  Visit Diagnosis: Unsteadiness on feet  Other abnormalities of gait and mobility  Abnormal posture  History of fall  Muscle weakness (generalized)  Stiffness of left knee, not elsewhere classified     Problem List Patient Active Problem List   Diagnosis Date Noted  . Allergy to statin medication 12/01/2019  . History of below  knee amputation, left ([place]) 07/30/2019  . Gangrene of left foot ([place]) 05/16/2019  . Subacute osteomyelitis of left foot ([place])   . Charcot's joint of foot, left   . Post-operative state   . PAD (peripheral artery disease) ([place]) 12/05/2018  . Osteomyelitis of ankle or foot, acute, left ([place]) 12/05/2018  . MRSA bacteremia 12/05/2018  . Sepsis without acute organ dysfunction ([place])   . Charcot's joint of foot due to diabetes ([place]) 09/18/2018  . BMI 36.0-36.9,adult   . Morbid obesity ([place]) 11/19/2017  . B12 deficiency 11/01/2017  . Diabetic foot ulcer ([place]) 12/14/2016  . Diabetic polyneuropathy associated with type 2 diabetes mellitus ([place]) 12/13/2015  . Lumbar stenosis with neurogenic claudication 09/02/2015  . Chronic constipation 07/13/2015  . OSA (obstructive sleep apnea) 06/25/2015  . 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/23/2015  . Hypertriglyceridemia 04/23/2015  . Asthma, mild intermittent 02/01/2015  . Type 2 diabetes mellitus with diabetic nephropathy ([place]) 12/31/2014  . Gastroesophageal reflux disease with esophagitis 12/31/2014  . Dysphagia  12/31/2014  . Bilateral carpal tunnel syndrome 12/01/2014  . Diverticulosis of colon (without mention of hemorrhage) 11/30/2012  . Essential hypertension, benign 11/28/2012    Jamey Reas PT, DPT 12/23/2019, 4:36 PM  [facility] [address] [place], [place], [address] Phone: [phone]   Fax:  [phone]  Name: Olivia Romero MRN: [id number] Date of Birth: 1955-06-07

## 2019-12-24 LAB — CULTURE, URINE COMPREHENSIVE

## 2019-12-25 ENCOUNTER — Telehealth: Payer: Self-pay

## 2019-12-25 ENCOUNTER — Ambulatory Visit: Payer: Medicare Other | Admitting: Physical Therapy

## 2019-12-25 ENCOUNTER — Other Ambulatory Visit: Payer: Self-pay

## 2019-12-25 ENCOUNTER — Ambulatory Visit: Payer: Medicare Other | Admitting: Family Medicine

## 2019-12-25 ENCOUNTER — Encounter: Payer: Self-pay | Admitting: Physical Therapy

## 2019-12-25 DIAGNOSIS — M25662 Stiffness of left knee, not elsewhere classified: Secondary | ICD-10-CM

## 2019-12-25 DIAGNOSIS — R293 Abnormal posture: Secondary | ICD-10-CM | POA: Diagnosis not present

## 2019-12-25 DIAGNOSIS — R2681 Unsteadiness on feet: Secondary | ICD-10-CM

## 2019-12-25 DIAGNOSIS — M6281 Muscle weakness (generalized): Secondary | ICD-10-CM

## 2019-12-25 DIAGNOSIS — R2689 Other abnormalities of gait and mobility: Secondary | ICD-10-CM | POA: Diagnosis not present

## 2019-12-25 DIAGNOSIS — Z9181 History of falling: Secondary | ICD-10-CM | POA: Diagnosis not present

## 2019-12-25 MED ORDER — NITROFURANTOIN MONOHYD MACRO 100 MG PO CAPS
100.0000 mg | ORAL_CAPSULE | Freq: Two times a day (BID) | ORAL | 0 refills | Status: DC
Start: 2019-12-25 — End: 2020-04-26

## 2019-12-25 NOTE — Telephone Encounter (Signed)
Left pt a message to notify and sent mychart notification, abx sent

## 2019-12-25 NOTE — Therapy (Signed)
[facility] Physical Therapy [address] [place], [place], [address] Phone: [phone]   Fax:  [phone]  Physical Therapy Treatment  Patient Details  Name: Olivia Romero MRN: [id number] Date of Birth: 04-14-1956 Referring Provider (PT): Meridee Score, MD   Encounter Date: 12/25/2019   PT End of Session - 12/25/19 2211    Visit Number 24    Number of Visits 72    Date for PT Re-Evaluation 03/11/20    Authorization Type [employer] Medicare, $30 co-pay, no visit limit    Progress Note Due on Visit 30    PT Start Time 1102    PT Stop Time 1145    PT Time Calculation (min) 43 min    Equipment Utilized During Treatment Gait belt    Activity Tolerance Patient tolerated treatment well    Behavior During Therapy [facility] for tasks assessed/performed           Past Medical History:  Diagnosis Date  . Anemia   . Arthritis   . Asthma   . Colon polyps    adenomatous  . Diabetes mellitus without complication ([place])   . Diabetic infection of left foot ([place]) 12/2018  . Diverticulosis of colon   . Esophagitis   . GERD (gastroesophageal reflux disease)   . Hemorrhoid    internal  . Hyperlipemia   . Hypertension   . IBS (irritable bowel syndrome)    no current prob - diet controlled  . Myalgia due to statin 11/19/2017  . Neuropathy   . Neuropathy of both feet   . Neuropathy of hand   . Pneumonia    x 4  . PONV (postoperative nausea and vomiting)    on some surgeries but not all procedures  . Skin ulcer of right ankle, limited to breakdown of skin ([place]) 12/31/2016   resolved per patient 05/14/19  . Sleep apnea    has had in the past lost 50 pounds and do longer uses cpap  . Statin intolerance 04/23/2015  . Stroke [facility]) 2009   mini stroke per patient  . Stroke-like episode 2009   TIA - mini stroke per patient  . 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 Uncontrolled type 2 diabetes mellitus with hyperglycemia ([place]) 11/01/2017     Past Surgical History:  Procedure Laterality Date  . ABDOMINAL HYSTERECTOMY    . AMPUTATION Left 12/06/2018   Procedure: PARTIAL AMPUTATION LEFT FOOT;  Surgeon: Edrick Kins, DPM;  Location: [place];  Service: Podiatry;  Laterality: Left;  . AMPUTATION Left 05/16/2019   Procedure: LEFT BELOW KNEE AMPUTATION;  Surgeon: Newt Minion, MD;  Location: [place];  Service: Orthopedics;  Laterality: Left;  . BACK SURGERY  09/02/2015  . BONE BIOPSY Left 12/06/2018   Procedure: Bone Biopsy;  Surgeon: Edrick Kins, DPM;  Location: [place];  Service: Podiatry;  Laterality: Left;  . CERVICAL FUSION    . CESAREAN SECTION  1986  . COLONOSCOPY N/A 11/30/2012   Procedure: COLONOSCOPY;  Surgeon: Irene Shipper, MD;  Location: [facility] ENDOSCOPY;  Service: Endoscopy;  Laterality: N/A;  . COLONOSCOPY WITH PROPOFOL N/A 01/03/2016   Procedure: COLONOSCOPY WITH PROPOFOL;  Surgeon: Manya Silvas, MD;  Location: [facility] ENDOSCOPY;  Service: Endoscopy;  Laterality: N/A;  . ESOPHAGOGASTRODUODENOSCOPY (EGD) WITH PROPOFOL N/A 01/14/2015   Procedure: ESOPHAGOGASTRODUODENOSCOPY (EGD) WITH PROPOFOL;  Surgeon: Manya Silvas, MD;  Location: [facility] ENDOSCOPY;  Service: Endoscopy;  Laterality: N/A;  . IRRIGATION AND DEBRIDEMENT FOOT Left 03/13/2018  Procedure: IRRIGATION AND DEBRIDEMENT FOOT WITH BONE BIOPSY WITH MISONIX DEBRIDER;  Surgeon: Evelina Bucy, DPM;  Location: [place];  Service: Podiatry;  Laterality: Left;  . IRRIGATION AND DEBRIDEMENT FOOT Left 12/06/2018   Procedure: Irrigation And Debridement Foot;  Surgeon: Edrick Kins, DPM;  Location: [place];  Service: Podiatry;  Laterality: Left;  . LEG AMPUTATION BELOW KNEE Left   . LUMBAR WOUND DEBRIDEMENT N/A 10/01/2015   Procedure: LUMBAR WOUND DEBRIDEMENT;  Surgeon: Ashok Pall, MD;  Location: [place] NEURO ORS;  Service: Neurosurgery;  Laterality: N/A;  LUMBAR WOUND DEBRIDEMENT  . NASAL SINUS SURGERY    . SAVORY DILATION N/A 01/14/2015   Procedure: SAVORY DILATION;  Surgeon: Manya Silvas, MD;  Location: [facility] ENDOSCOPY;  Service: Endoscopy;  Laterality: N/A;  . TONSILLECTOMY    . WISDOM TOOTH EXTRACTION      There were no vitals filed for this visit.   Subjective Assessment - 12/25/19 1105    Subjective She is ordering crutches today. She is waiting on compression stockings.    Pertinent History L TTA, DM, CVA, sleep apnea, PNA, neuropathy bil. 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 No/denies    Pain Onset In the past 7 days                             [facility] Adult PT Treatment/Exercise - 12/25/19 1105      Transfers   Transfers Sit to Stand;Stand to [employer] Transfers    Sit to Stand 5: Supervision;With upper extremity assist;With armrests;From chair/3-in-1;4: Min assist   SBA to 2 forearm crutches, minA 1 crutch   Sit to Stand Details Visual cues for safe use of DME/AE;Verbal cues for precautions/safety;Verbal cues for technique    Stand to Sit 5: Supervision;With upper extremity assist;With armrests;To chair/3-in-1;4: Min guard   supervision from 2 forearm crutches, min guard from 1 crutch   Stand to Sit Details (indicate cue type and reason) Visual cues for safe use of DME/AE;Verbal cues for precautions/safety;Verbal cues for technique      Ambulation/Gait   Ambulation/Gait Yes    Ambulation/Gait Assistance 5: Supervision;4: Min assist   supervision 2 crutches & minA 1 crutch   Ambulation/Gait Assistance Details Verbal, visual (mirror), demo & tactile cues on technique for upright posture, step length, weight shift onto prosthesis in stance.      Ambulation Distance (Feet) 40 Feet   40' X 5 w/ 1 crutch, 100' 2 crutches   Assistive device Prosthesis;Lofstrands;Rollator;L  Forearm Crutch   enter/ exit with rollator   Gait Pattern Step-through pattern;Decreased arm swing - left;Decreased step length - right;Decreased stance time - left;Antalgic;Trunk flexed    Ambulation Surface Level;Indoor      High Level Balance   High Level Balance Activities Side stepping;Backward walking;Direction changes;Turns   single crutch inside //bars w/ intermittent touch   High Level Balance Comments verbal, visual (mirror), demo & tactile cues on movement, technique and wt shift - backwards, sidestep, turning 90* rt/lt       Self-Care   Self-Care Lifting    Lifting PT demo & verbal cues on technique with TTA prosthesis & balance.  Picking up bottle 10" tall then  cone 6" tall with min guard. Picking wash cloth with minA.       Neuro Re-ed    Neuro Re-ed Details  --      Knee/Hip Exercises: Machines for Strengthening   Cybex Knee Extension --    Cybex Knee Flexion --    Cybex Leg Press Shuttle leg press BLEs 87# 10 reps 2 sets      Prosthetics   Current prosthetic wear tolerance (days/week)  daily    Current prosthetic wear tolerance (#hours/day)  prosthesis most of awake hours drying 4x/day    Residual limb condition  no open areas, rash on posterior proximal liner area from sweating.  PT instructed to use stockinetter under proximal liner.     Education Provided Skin check;Residual limb care;Proper Donning    Person(s) Educated Patient    Education Method Explanation;Demonstration;Verbal cues;Tactile cues    Education Method Verbalized understanding;Returned demonstration;Tactile cues required;Verbal cues required;Needs further instruction    Donning Prosthesis Supervision    Doffing Prosthesis Modified independent (device/increased time)                    PT Short Term Goals - 12/18/19 1732      PT SHORT TERM GOAL #1   Title Patient demonstrates and verbalizes understanding of updated HEP.   (All STGs Target Date: 01/15/2020)    Time 4    Period Weeks     Status New    Target Date 01/15/20      PT SHORT TERM GOAL #2   Title Patient verbalizes understanding signs & management of sweating with prosthetic wear & use.    Time 4    Period Weeks    Status New    Target Date 01/15/20      PT SHORT TERM GOAL #3   Title Timed Up & Go with rollator walker, AFO & prosthesis <20 seconds.    Time 4    Period Weeks    Status New    Target Date 01/15/20      PT SHORT TERM GOAL #4   Title Patient ambulates 62' around furniture / obstacles with single forearm crutches & prosthesis with minimal guard.    Time 4    Period Weeks    Status New    Target Date 01/15/20      PT SHORT TERM GOAL #5   Title Patient negotiates ramps & curbs with 2 forearm crutches, AFO & prosthesis with supervision.    Time 4    Period Weeks    Status Revised    Target Date 01/15/20             PT Long Term Goals - 12/18/19 1759      PT LONG TERM GOAL #1   Title Patient verbalizes & demonstrates proper prosthetic care and tolerates wear >90% of awake hours without skin issues to enable safe utilization of prosthesis.  (All LTGs Target Date 12/19/2019)    Time 12    Period Weeks    Status Revised    Target Date 03/11/20      PT LONG TERM GOAL #2   Title Tasks of [place] Balance Test with single forearm crutch support >36/56 indicating improved function with standing ADLs    Time 12    Period Weeks    Status New    Target Date 03/11/20      PT LONG TERM GOAL #3   Title Patient ambulates 500' with rollator walker or 2 forearm crutches  with 63' on grass with AFO & prosthesis modified independent to enable community mobility.    Time 12    Period Weeks    Status Revised    Target Date 03/11/20      PT LONG TERM GOAL #4   Title Dynamic Gait Index with 2 forearm crutches >/= 14/24    Time 12    Period Weeks    Status New    Target Date 03/11/20      PT LONG TERM GOAL #5   Title Patient negotiates ramps & curbs with 2 forearm crutches or rollator walker &  prosthesis modified independent for community access.    Time 12    Period Weeks    Status On-going    Target Date 03/11/20      PT LONG TERM GOAL #6   Title Patient ambulates 19' around furniture carrying item in free UE with single forearm crutch, prosthesis & AFO modified independent.    Time 12    Period Weeks    Status Revised    Target Date 03/11/20      PT LONG TERM GOAL #7   Title Timed Up & Go with single forearm crutch <35 seconds.    Time 12    Period Weeks    Status New    Target Date 03/11/20                 Plan - 12/25/19 2212    Clinical Impression Statement PT session worked on prosthetic gait with single crutch including changing directions, backwards & side stepping with tactile, visual (mirror), demo & verbal cues on technique.    Personal Factors and Comorbidities Comorbidity 3+;Fitness;Time since onset of injury/illness/exacerbation    Comorbidities L TTA, DM, CVA, sleep apnea, PNA, neuropathy bil. feet and hand, HTN, s/p cervical fusion, s/p back surgery, IBS, asthma, Covid in Jan 2021    Examination-Activity Limitations Hygiene/Grooming;Locomotion Level;Reach Overhead;Stairs;Stand;Transfers    Examination-Participation Restrictions Recruitment consultant Evolving/Moderate complexity    Rehab Potential Good    PT Frequency 2x / week    PT Duration 12 weeks    PT Treatment/Interventions ADLs/Self Care Home Management;DME Instruction;Gait training;Stair training;Functional mobility training;Therapeutic activities;Therapeutic exercise;Balance training;Neuromuscular re-education;Patient/family education;Orthotic Fit/Training;Prosthetic Training;Vestibular;Manual techniques    PT Next Visit Plan work towards updated STGs, balance & gait activities, therapuetic exercise    PT Home Exercise Plan Access Code: [email or web address]    at sink for mini squats with UE support, weight shifts with UE support, and balance feet apart no  Ue support all with chair behind her    Consulted and Agree with Plan of Care Patient           Patient will benefit from skilled therapeutic intervention in order to improve the following deficits and impairments:  Abnormal gait, Decreased activity tolerance, Decreased balance, Decreased endurance, Decreased knowledge of use of DME, Decreased mobility, Decreased range of motion, Decreased safety awareness, Decreased strength, Increased edema, Impaired flexibility, Impaired sensation, Postural dysfunction, Prosthetic Dependency, Obesity, Pain  Visit Diagnosis: Unsteadiness on feet  Other abnormalities of gait and mobility  Abnormal posture  History of fall  Muscle weakness (generalized)  Stiffness of left knee, not elsewhere classified     Problem List Patient Active Problem List   Diagnosis Date Noted  . Allergy to statin medication 12/01/2019  . History of below knee amputation, left ([place]) 07/30/2019  . Gangrene of left foot ([place]) 05/16/2019  . Subacute osteomyelitis of left foot (  [place])   . Charcot's joint of foot, left   . Post-operative state   . PAD (peripheral artery disease) ([place]) 12/05/2018  . Osteomyelitis of ankle or foot, acute, left ([place]) 12/05/2018  . MRSA bacteremia 12/05/2018  . Sepsis without acute organ dysfunction ([place])   . Charcot's joint of foot due to diabetes ([name]) 09/18/2018  . BMI 36.0-36.9,adult   . Morbid obesity ([place]) 11/19/2017  . B12 deficiency 11/01/2017  . Diabetic foot ulcer ([place]) 12/14/2016  . Diabetic polyneuropathy associated with type 2 diabetes mellitus ([place]) 12/13/2015  . Lumbar stenosis with neurogenic claudication 09/02/2015  . Chronic constipation 07/13/2015  . OSA (obstructive sleep apnea) 06/25/2015  . 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/23/2015  . Hypertriglyceridemia 04/23/2015  . Asthma, mild intermittent 02/01/2015  . Type 2 diabetes mellitus with diabetic nephropathy ([place]) 12/31/2014  . Gastroesophageal  reflux disease with esophagitis 12/31/2014  . Dysphagia 12/31/2014  . Bilateral carpal tunnel syndrome 12/01/2014  . Diverticulosis of colon (without mention of hemorrhage) 11/30/2012  . Essential hypertension, benign 11/28/2012    Jamey Reas, PT, DPT 12/25/2019, 10:26 PM  [facility] Physical Therapy [address] [place], [place], [address] Phone: [phone]   Fax:  [phone]  Name: Olivia Romero MRN: [id number] Date of Birth: Dec 24, 1955

## 2019-12-25 NOTE — Telephone Encounter (Signed)
-----   Message from Bjorn Loser, MD sent at 12/25/2019 10:18 AM EDT ----- Macrodantin 100 mg twice a day for 7 days ----- Message ----- From: Royanne Foots, CMA Sent: 12/24/2019   4:43 PM EDT To: Bjorn Loser, MD   ----- Message ----- From: Interface, [facility] Lab Results In Sent: 12/22/2019   4:38 PM EDT To: [facility]

## 2019-12-29 DIAGNOSIS — M5020 Other cervical disc displacement, unspecified cervical region: Secondary | ICD-10-CM | POA: Insufficient documentation

## 2019-12-29 DIAGNOSIS — J45909 Unspecified asthma, uncomplicated: Secondary | ICD-10-CM | POA: Insufficient documentation

## 2019-12-29 DIAGNOSIS — Z6836 Body mass index (BMI) 36.0-36.9, adult: Secondary | ICD-10-CM

## 2019-12-29 DIAGNOSIS — A419 Sepsis, unspecified organism: Secondary | ICD-10-CM | POA: Insufficient documentation

## 2019-12-29 DIAGNOSIS — M79603 Pain in arm, unspecified: Secondary | ICD-10-CM | POA: Insufficient documentation

## 2019-12-29 DIAGNOSIS — M86272 Subacute osteomyelitis, left ankle and foot: Secondary | ICD-10-CM

## 2019-12-29 DIAGNOSIS — M542 Cervicalgia: Secondary | ICD-10-CM | POA: Insufficient documentation

## 2019-12-29 DIAGNOSIS — R293 Abnormal posture: Secondary | ICD-10-CM | POA: Insufficient documentation

## 2019-12-29 DIAGNOSIS — Z9889 Other specified postprocedural states: Secondary | ICD-10-CM

## 2019-12-29 DIAGNOSIS — I10 Essential (primary) hypertension: Secondary | ICD-10-CM | POA: Insufficient documentation

## 2019-12-29 DIAGNOSIS — M5412 Radiculopathy, cervical region: Secondary | ICD-10-CM | POA: Insufficient documentation

## 2019-12-29 DIAGNOSIS — S134XXA Sprain of ligaments of cervical spine, initial encounter: Secondary | ICD-10-CM | POA: Insufficient documentation

## 2019-12-29 DIAGNOSIS — M5090 Cervical disc disorder, unspecified, unspecified cervical region: Secondary | ICD-10-CM | POA: Insufficient documentation

## 2019-12-30 ENCOUNTER — Encounter: Payer: Self-pay | Admitting: Physical Therapy

## 2019-12-30 ENCOUNTER — Ambulatory Visit (INDEPENDENT_AMBULATORY_CARE_PROVIDER_SITE_OTHER): Payer: Medicare Other | Admitting: Physical Therapy

## 2019-12-30 ENCOUNTER — Other Ambulatory Visit: Payer: Self-pay

## 2019-12-30 ENCOUNTER — Telehealth: Payer: Self-pay | Admitting: Physical Therapy

## 2019-12-30 DIAGNOSIS — Z9181 History of falling: Secondary | ICD-10-CM

## 2019-12-30 DIAGNOSIS — M25662 Stiffness of left knee, not elsewhere classified: Secondary | ICD-10-CM

## 2019-12-30 DIAGNOSIS — M6281 Muscle weakness (generalized): Secondary | ICD-10-CM

## 2019-12-30 DIAGNOSIS — R293 Abnormal posture: Secondary | ICD-10-CM | POA: Diagnosis not present

## 2019-12-30 DIAGNOSIS — R2681 Unsteadiness on feet: Secondary | ICD-10-CM | POA: Diagnosis not present

## 2019-12-30 DIAGNOSIS — R2689 Other abnormalities of gait and mobility: Secondary | ICD-10-CM | POA: Diagnosis not present

## 2019-12-30 NOTE — Telephone Encounter (Signed)
Olivia Romero is ambulating with bilateral forearm crutches and needs a pair for home. Can you please write a prescription for bilateral forearm crutches & place in Epic? Thank you Shirlean Mylar

## 2019-12-30 NOTE — Patient Instructions (Signed)
Rheumatoid Arthritis Rheumatoid arthritis (RA) is a long-term (chronic) disease that causes inflammation in your joints. RA may start slowly. It most often affects the small joints of the hands and feet. Usually, the same joints are affected on both sides of your body. Inflammation from RA can also affect other parts of your body, including your heart, eyes, or lungs. There is no cure for RA, but medicines can help your symptoms and halt or slow down the progression of the disease. What are the causes? RA is an autoimmune disease. When you have an autoimmune disease, your body's defense system (immune system) mistakenly attacks healthy body tissues. The exact cause of RA is not known. What increases the risk? You are more likely to develop this condition if you:  Are a woman.  Have a family history of RA or other autoimmune diseases.  Have a history of smoking.  Are obese.  Have been exposed to pollutants or chemicals. What are the signs or symptoms? The first symptom of this condition may be morning stiffness that lasts longer than 30 minutes.  Symptoms usually start gradually. They are often worse in the morning. As RA progresses, symptoms may include:  Pain, stiffness, swelling, warmth, and tenderness in joints on both sides of your body.  Loss of energy.  Loss of appetite.  Weight loss.  Low-grade fever.  Dry eyes and dry mouth.  Firm lumps (rheumatoid nodules) that grow beneath your skin in areas such as your forearm bones near your elbows and on your hands.  Changes in the appearance of joints (deformity) and loss of joint function. Symptoms of this condition vary from person to person.  Symptoms of RA often come and go.  Sometimes, symptoms get worse for a period of time. These are called flares. How is this diagnosed? This condition is diagnosed based on your symptoms, medical history, and physical exam.  You may have X-rays or an MRI to check for the type of  joint changes that are caused by RA. You may also have blood tests to look for:  Proteins (antibodies) that your immune system may make if you have RA. These include rheumatoid factor (RF) and anti-CCP. ? When blood tests show these proteins, you are said to have "seropositive RA." ? When blood tests do not show these proteins, you may have "seronegative RA."  Inflammation in your blood.  A low number of red blood cells (anemia). How is this treated? The goals of treatment are to relieve pain, reduce inflammation, and slow down or stop joint damage and disability. Treatment may include:  Lifestyle changes. It is important to rest as needed, eat a healthy diet, and exercise.  Medicines. Your health care provider may adjust your medicines every 3 months until treatment goals are reached. Common medicines include: ? Pain relievers (analgesics). ? Corticosteroids and NSAIDs to reduce inflammation. ? Disease-modifying antirheumatic drugs (DMARDs) to try to slow the course of the disease. ? Biologic response modifiers to reduce inflammation and damage.  Physical therapy and occupational therapy.  Surgery, if you have severe joint damage. Joint replacement or fusing of joints may be needed. Your health care provider will work with you to identify the best treatment option for you based on assessment of the overall disease activity in your body. Follow these instructions at home: Activity  Return to your normal activities as told by your health care provider. Ask your health care provider what activities are safe for you.  Rest when you are having a   flare.  Start an exercise program as told by your health care provider. General instructions  Keep all follow-up visits as told by your health care provider. This is important.  Take over-the-counter and prescription medicines only as told by your health care provider. Where to find more information  American College of Rheumatology:  www.rheumatology.org  Arthritis Foundation: www.arthritis.org Contact a health care provider if:  You have a flare-up of RA symptoms.  You have a fever.  You have side effects from your medicines. Get help right away if:  You have chest pain.  You have trouble breathing.  You quickly develop a hot, painful joint that is more severe than your usual joint aches. Summary  Rheumatoid arthritis (RA) is a long-term (chronic) disease that causes inflammation in your joints.  RA is an autoimmune disease.  The goals of treatment are to relieve pain, reduce inflammation, and slow down or stop joint damage and disability. This information is not intended to replace advice given to you by your health care provider. Make sure you discuss any questions you have with your health care provider. Document Revised: 12/03/2018 Document Reviewed: 01/22/2018 Elsevier Patient Education  2020 [employer]  

## 2019-12-30 NOTE — Therapy (Signed)
[facility] Physical Therapy [address] [place], [place], [address] Phone: [phone]   Fax:  [phone]  Physical Therapy Treatment  Patient Details  Name: Olivia Romero MRN: [id number] Date of Birth: 08/02/1955 Referring Provider (PT): Meridee Score, MD   Encounter Date: 12/30/2019   PT End of Session - 12/30/19 1245    Visit Number 25    Number of Visits 76    Date for PT Re-Evaluation 03/11/20    Authorization Type [employer] Medicare, $30 co-pay, no visit limit    Progress Note Due on Visit 30    PT Start Time 1100    PT Stop Time 1145    PT Time Calculation (min) 45 min    Equipment Utilized During Treatment Gait belt    Activity Tolerance Patient tolerated treatment well    Behavior During Therapy [facility] for tasks assessed/performed           Past Medical History:  Diagnosis Date  . Anemia   . Arthritis   . Asthma   . Colon polyps    adenomatous  . Diabetes mellitus without complication ([place])   . Diabetic infection of left foot ([place]) 12/2018  . Diverticulosis of colon   . Esophagitis   . GERD (gastroesophageal reflux disease)   . Hemorrhoid    internal  . Hyperlipemia   . Hypertension   . IBS (irritable bowel syndrome)    no current prob - diet controlled  . Myalgia due to statin 11/19/2017  . Neuropathy   . Neuropathy of both feet   . Neuropathy of hand   . Pneumonia    x 4  . PONV (postoperative nausea and vomiting)    on some surgeries but not all procedures  . Skin ulcer of right ankle, limited to breakdown of skin ([place]) 12/31/2016   resolved per patient 05/14/19  . Sleep apnea    has had in the past lost 50 pounds and do longer uses cpap  . Statin intolerance 04/23/2015  . Stroke [facility]) 2009   mini stroke per patient  . Stroke-like episode 2009   TIA - mini stroke per patient  . 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 Uncontrolled type 2 diabetes mellitus with hyperglycemia ([place]) 11/01/2017     Past Surgical History:  Procedure Laterality Date  . ABDOMINAL HYSTERECTOMY    . AMPUTATION Left 12/06/2018   Procedure: PARTIAL AMPUTATION LEFT FOOT;  Surgeon: Edrick Kins, DPM;  Location: [place];  Service: Podiatry;  Laterality: Left;  . AMPUTATION Left 05/16/2019   Procedure: LEFT BELOW KNEE AMPUTATION;  Surgeon: Newt Minion, MD;  Location: [place];  Service: Orthopedics;  Laterality: Left;  . BACK SURGERY  09/02/2015  . BONE BIOPSY Left 12/06/2018   Procedure: Bone Biopsy;  Surgeon: Edrick Kins, DPM;  Location: [place];  Service: Podiatry;  Laterality: Left;  . CERVICAL FUSION    . CESAREAN SECTION  1986  . COLONOSCOPY N/A 11/30/2012   Procedure: COLONOSCOPY;  Surgeon: Irene Shipper, MD;  Location: [facility] ENDOSCOPY;  Service: Endoscopy;  Laterality: N/A;  . COLONOSCOPY WITH PROPOFOL N/A 01/03/2016   Procedure: COLONOSCOPY WITH PROPOFOL;  Surgeon: Manya Silvas, MD;  Location: [facility] ENDOSCOPY;  Service: Endoscopy;  Laterality: N/A;  . ESOPHAGOGASTRODUODENOSCOPY (EGD) WITH PROPOFOL N/A 01/14/2015   Procedure: ESOPHAGOGASTRODUODENOSCOPY (EGD) WITH PROPOFOL;  Surgeon: Manya Silvas, MD;  Location: [facility] ENDOSCOPY;  Service: Endoscopy;  Laterality: N/A;  . IRRIGATION AND DEBRIDEMENT FOOT Left 03/13/2018  Procedure: IRRIGATION AND DEBRIDEMENT FOOT WITH BONE BIOPSY WITH MISONIX DEBRIDER;  Surgeon: Evelina Bucy, DPM;  Location: [place];  Service: Podiatry;  Laterality: Left;  . IRRIGATION AND DEBRIDEMENT FOOT Left 12/06/2018   Procedure: Irrigation And Debridement Foot;  Surgeon: Edrick Kins, DPM;  Location: [place];  Service: Podiatry;  Laterality: Left;  . LEG AMPUTATION BELOW KNEE Left   . LUMBAR WOUND DEBRIDEMENT N/A 10/01/2015   Procedure: LUMBAR WOUND DEBRIDEMENT;  Surgeon: Ashok Pall, MD;  Location: [place] NEURO ORS;  Service: Neurosurgery;  Laterality: N/A;  LUMBAR WOUND DEBRIDEMENT  . NASAL SINUS SURGERY    . SAVORY DILATION N/A 01/14/2015   Procedure: SAVORY DILATION;  Surgeon: Manya Silvas, MD;  Location: [facility] ENDOSCOPY;  Service: Endoscopy;  Laterality: N/A;  . TONSILLECTOMY    . WISDOM TOOTH EXTRACTION      There were no vitals filed for this visit.   Subjective Assessment - 12/30/19 1100    Subjective She developed rash on residual limb over weekend.  Her hands are hurting and doctor feels probably RA.    Pertinent History L TTA, DM, CVA, sleep apnea, PNA, neuropathy bil. 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 Yes    Pain Score 6     Pain Location Hand    Pain Orientation Right;Left    Pain Descriptors / Indicators Aching;Sore    Pain Type Chronic pain    Pain Onset In the past 7 days    Pain Frequency Constant    Aggravating Factors  RA flare up    Pain Relieving Factors heat                             [facility] Adult PT Treatment/Exercise - 12/30/19 1100      Transfers   Transfers Sit to Stand;Stand to [employer] Transfers    Sit to Stand 5: Supervision;With upper extremity assist;With armrests;From chair/3-in-1;4: Min assist   SBA to 2 forearm crutches, minA 1 crutch   Sit to Stand Details Visual cues for safe use of DME/AE;Verbal cues for precautions/safety;Verbal cues for technique    Stand to Sit 5: Supervision;With upper extremity assist;With armrests;To chair/3-in-1;4: Min guard   supervision from 2 forearm crutches, min guard from 1 crutch   Stand to Sit Details (indicate cue type and reason) Visual cues for safe use of DME/AE;Verbal cues for precautions/safety;Verbal cues for technique      Ambulation/Gait   Ambulation/Gait Yes    Ambulation/Gait Assistance 5: Supervision    Ambulation/Gait Assistance Details verbal cues on upright posture    Ambulation Distance  (Feet) 150 Feet   150' X 1   Assistive device Prosthesis;Lofstrands;Rollator   enter/ exit with rollator   Gait Pattern Step-through pattern;Decreased arm swing - left;Decreased step length - right;Decreased stance time - left;Antalgic;Trunk flexed    Ambulation Surface Level;Indoor      Self-Care   Heat/Ice Application PT educated including handout of contrast bath to hands.       Knee/Hip Exercises: Aerobic   Nustep level 5 with BUEs & BLEs 15 minutes PT cues on technique & full motion    PT provided patient education while patient using Nustep.  PT educated patient while working on [employer] Comments  use of Hydrocortisone cream to rash on limb. She has changed body wash from white Dove liquid to pink. PT recommended switching back as rash is possible reaction. Rash could also be part of RA flare up which increases intensity with heat of prosthetic liner.  PT reviewed proper cleaning & storage of liner when not wearing.      Current prosthetic wear tolerance (days/week)  daily    Current prosthetic wear tolerance (#hours/day)  prosthesis most of awake hours drying 4x/day    Residual limb condition  no open areas, rash on posterior proximal liner area from sweating.  PT instructed to use stockinetter under proximal liner.     Education Provided Skin check;Residual limb care;Proper Donning;Prosthetic cleaning;Proper wear schedule/adjustment    Person(s) Educated Patient    Education Method Explanation;Demonstration;Verbal cues    Education Method Verbalized understanding;Verbal cues required;Needs further instruction    Donning Prosthesis Supervision    Doffing Prosthesis Modified independent (device/increased time)                  PT Education - 12/30/19 1145    Education Details diet can effect RA flare ups and PT recommended looking further into foods that she should avoid,  contrast bath & paraffin bath (already has one at home) for managing  hand pain    Person(s) Educated Patient    Methods Explanation;Verbal cues;Handout    Comprehension Verbalized understanding;Need further instruction            PT Short Term Goals - 12/18/19 1732      PT SHORT TERM GOAL #1   Title Patient demonstrates and verbalizes understanding of updated HEP.   (All STGs Target Date: 01/15/2020)    Time 4    Period Weeks    Status New    Target Date 01/15/20      PT SHORT TERM GOAL #2   Title Patient verbalizes understanding signs & management of sweating with prosthetic wear & use.    Time 4    Period Weeks    Status New    Target Date 01/15/20      PT SHORT TERM GOAL #3   Title Timed Up & Go with rollator walker, AFO & prosthesis <20 seconds.    Time 4    Period Weeks    Status New    Target Date 01/15/20      PT SHORT TERM GOAL #4   Title Patient ambulates 85' around furniture / obstacles with single forearm crutches & prosthesis with minimal guard.    Time 4    Period Weeks    Status New    Target Date 01/15/20      PT SHORT TERM GOAL #5   Title Patient negotiates ramps & curbs with 2 forearm crutches, AFO & prosthesis with supervision.    Time 4    Period Weeks    Status Revised    Target Date 01/15/20             PT Long Term Goals - 12/18/19 1759      PT LONG TERM GOAL #1   Title Patient verbalizes & demonstrates proper prosthetic care and tolerates wear >90% of awake hours without skin issues to enable safe utilization of prosthesis.  (All LTGs Target Date 12/19/2019)    Time 12    Period Weeks    Status Revised  Target Date 03/11/20      PT LONG TERM GOAL #2   Title Tasks of [place] Balance Test with single forearm crutch support >36/56 indicating improved function with standing ADLs    Time 12    Period Weeks    Status New    Target Date 03/11/20      PT LONG TERM GOAL #3   Title Patient ambulates 500' with rollator walker or 2 forearm crutches with 50' on grass with AFO & prosthesis modified independent  to enable community mobility.    Time 12    Period Weeks    Status Revised    Target Date 03/11/20      PT LONG TERM GOAL #4   Title Dynamic Gait Index with 2 forearm crutches >/= 14/24    Time 12    Period Weeks    Status New    Target Date 03/11/20      PT LONG TERM GOAL #5   Title Patient negotiates ramps & curbs with 2 forearm crutches or rollator walker & prosthesis modified independent for community access.    Time 12    Period Weeks    Status On-going    Target Date 03/11/20      PT LONG TERM GOAL #6   Title Patient ambulates 53' around furniture carrying item in free UE with single forearm crutch, prosthesis & AFO modified independent.    Time 12    Period Weeks    Status Revised    Target Date 03/11/20      PT LONG TERM GOAL #7   Title Timed Up & Go with single forearm crutch <35 seconds.    Time 12    Period Weeks    Status New    Target Date 03/11/20                 Plan - 12/30/19 1246    Clinical Impression Statement Patient has rash on residual limb that could be allergic reaction to different Dove body wash or part of RA flare up.  Patient reports that Medicare has not paid for walking device and wants to file for loftstrand crutches.    Personal Factors and Comorbidities Comorbidity 3+;Fitness;Time since onset of injury/illness/exacerbation    Comorbidities L TTA, DM, CVA, sleep apnea, PNA, neuropathy bil. feet and hand, HTN, s/p cervical fusion, s/p back surgery, IBS, asthma, Covid in Jan 2021    Examination-Activity Limitations Hygiene/Grooming;Locomotion Level;Reach Overhead;Stairs;Stand;Transfers    Examination-Participation Restrictions Recruitment consultant Evolving/Moderate complexity    Rehab Potential Good    PT Frequency 2x / week    PT Duration 12 weeks    PT Treatment/Interventions ADLs/Self Care Home Management;DME Instruction;Gait training;Stair training;Functional mobility  training;Therapeutic activities;Therapeutic exercise;Balance training;Neuromuscular re-education;Patient/family education;Orthotic Fit/Training;Prosthetic Training;Vestibular;Manual techniques    PT Next Visit Plan check rash on residual limb, work towards updated STGs, balance & gait activities, therapuetic exercise    PT Home Exercise Plan Access Code: [email or web address]    at sink for mini squats with UE support, weight shifts with UE support, and balance feet apart no Ue support all with chair behind her    Consulted and Agree with Plan of Care Patient           Patient will benefit from skilled therapeutic intervention in order to improve the following deficits and impairments:  Abnormal gait, Decreased activity tolerance, Decreased balance, Decreased endurance, Decreased knowledge of use of DME, Decreased mobility, Decreased range of motion,  Decreased safety awareness, Decreased strength, Increased edema, Impaired flexibility, Impaired sensation, Postural dysfunction, Prosthetic Dependency, Obesity, Pain  Visit Diagnosis: Unsteadiness on feet  Other abnormalities of gait and mobility  Abnormal posture  History of fall  Muscle weakness (generalized)  Stiffness of left knee, not elsewhere classified     Problem List Patient Active Problem List   Diagnosis Date Noted  . Allergy to statin medication 12/01/2019  . History of below knee amputation, left ([place]) 07/30/2019  . Gangrene of left foot ([place]) 05/16/2019  . Subacute osteomyelitis of left foot ([place])   . Charcot's joint of foot, left   . Post-operative state   . PAD (peripheral artery disease) ([place]) 12/05/2018  . Osteomyelitis of ankle or foot, acute, left ([place]) 12/05/2018  . MRSA bacteremia 12/05/2018  . Sepsis without acute organ dysfunction ([place])   . Charcot's joint of foot due to diabetes ([place]) 09/18/2018  . BMI 36.0-36.9,adult   . Morbid obesity ([place]) 11/19/2017  . B12 deficiency 11/01/2017  . Diabetic foot ulcer ([place])  12/14/2016  . Diabetic polyneuropathy associated with type 2 diabetes mellitus ([place]) 12/13/2015  . Lumbar stenosis with neurogenic claudication 09/02/2015  . Chronic constipation 07/13/2015  . OSA (obstructive sleep apnea) 06/25/2015  . 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/23/2015  . Hypertriglyceridemia 04/23/2015  . Asthma, mild intermittent 02/01/2015  . Type 2 diabetes mellitus with diabetic nephropathy ([place]) 12/31/2014  . Gastroesophageal reflux disease with esophagitis 12/31/2014  . Dysphagia 12/31/2014  . Bilateral carpal tunnel syndrome 12/01/2014  . Diverticulosis of colon (without mention of hemorrhage) 11/30/2012  . Essential hypertension, benign 11/28/2012    Jamey Reas, PT, DPT 12/30/2019, 10:11 PM  [facility] [address] [place], [place], [address] Phone: [phone]   Fax:  [phone]  Name: ROOPA GRAVER MRN: [id number] Date of Birth: Aug 08, 1955

## 2019-12-31 ENCOUNTER — Other Ambulatory Visit: Payer: Self-pay | Admitting: Orthopedic Surgery

## 2019-12-31 DIAGNOSIS — S88119A Complete traumatic amputation at level between knee and ankle, unspecified lower leg, initial encounter: Secondary | ICD-10-CM

## 2019-12-31 NOTE — Telephone Encounter (Signed)
Thank you :)

## 2019-12-31 NOTE — Telephone Encounter (Signed)
Order written

## 2020-01-01 ENCOUNTER — Ambulatory Visit (INDEPENDENT_AMBULATORY_CARE_PROVIDER_SITE_OTHER): Payer: Medicare Other | Admitting: Physical Therapy

## 2020-01-01 ENCOUNTER — Encounter: Payer: Self-pay | Admitting: Physical Therapy

## 2020-01-01 ENCOUNTER — Other Ambulatory Visit: Payer: Self-pay

## 2020-01-01 DIAGNOSIS — Z9181 History of falling: Secondary | ICD-10-CM

## 2020-01-01 DIAGNOSIS — R2681 Unsteadiness on feet: Secondary | ICD-10-CM | POA: Diagnosis not present

## 2020-01-01 DIAGNOSIS — M25662 Stiffness of left knee, not elsewhere classified: Secondary | ICD-10-CM

## 2020-01-01 DIAGNOSIS — R293 Abnormal posture: Secondary | ICD-10-CM | POA: Diagnosis not present

## 2020-01-01 DIAGNOSIS — R2689 Other abnormalities of gait and mobility: Secondary | ICD-10-CM | POA: Diagnosis not present

## 2020-01-01 DIAGNOSIS — M6281 Muscle weakness (generalized): Secondary | ICD-10-CM

## 2020-01-01 NOTE — Therapy (Signed)
[facility] [address] [place], [place], [address] Phone: [phone]   Fax:  [phone]  Physical Therapy Treatment  Patient Details  Name: Olivia Romero MRN: [id number] Date of Birth: 20-Dec-1955 Referring Provider (PT): Meridee Score, MD   Encounter Date: 01/01/2020   PT End of Session - 01/01/20 1857    Visit Number 26    Number of Visits 54    Date for PT Re-Evaluation 03/11/20    Authorization Type [employer] Medicare, $30 co-pay, no visit limit    Progress Note Due on Visit 30    PT Start Time 1100    PT Stop Time 1145    PT Time Calculation (min) 45 min    Equipment Utilized During Treatment Gait belt    Activity Tolerance Patient tolerated treatment well    Behavior During Therapy [facility] for tasks assessed/performed           Past Medical History:  Diagnosis Date  . Anemia   . Arthritis   . Asthma   . Colon polyps    adenomatous  . Diabetes mellitus without complication ([place])   . Diabetic infection of left foot ([place]) 12/2018  . Diverticulosis of colon   . Esophagitis   . GERD (gastroesophageal reflux disease)   . Hemorrhoid    internal  . Hyperlipemia   . Hypertension   . IBS (irritable bowel syndrome)    no current prob - diet controlled  . Myalgia due to statin 11/19/2017  . Neuropathy   . Neuropathy of both feet   . Neuropathy of hand   . Pneumonia    x 4  . PONV (postoperative nausea and vomiting)    on some surgeries but not all procedures  . Skin ulcer of right ankle, limited to breakdown of skin ([place]) 12/31/2016   resolved per patient 05/14/19  . Sleep apnea    has had in the past lost 50 pounds and do longer uses cpap  . Statin intolerance 04/23/2015  . Stroke [facility]) 2009   mini stroke per patient  . Stroke-like episode 2009   TIA - mini stroke per patient  . 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 Uncontrolled type 2 diabetes mellitus with hyperglycemia ([place]) 11/01/2017     Past Surgical History:  Procedure Laterality Date  . ABDOMINAL HYSTERECTOMY    . AMPUTATION Left 12/06/2018   Procedure: PARTIAL AMPUTATION LEFT FOOT;  Surgeon: Edrick Kins, DPM;  Location: [place];  Service: Podiatry;  Laterality: Left;  . AMPUTATION Left 05/16/2019   Procedure: LEFT BELOW KNEE AMPUTATION;  Surgeon: Newt Minion, MD;  Location: [place];  Service: Orthopedics;  Laterality: Left;  . BACK SURGERY  09/02/2015  . BONE BIOPSY Left 12/06/2018   Procedure: Bone Biopsy;  Surgeon: Edrick Kins, DPM;  Location: [place];  Service: Podiatry;  Laterality: Left;  . CERVICAL FUSION    . CESAREAN SECTION  1986  . COLONOSCOPY N/A 11/30/2012   Procedure: COLONOSCOPY;  Surgeon: Irene Shipper, MD;  Location: [facility] ENDOSCOPY;  Service: Endoscopy;  Laterality: N/A;  . COLONOSCOPY WITH PROPOFOL N/A 01/03/2016   Procedure: COLONOSCOPY WITH PROPOFOL;  Surgeon: Manya Silvas, MD;  Location: [facility] ENDOSCOPY;  Service: Endoscopy;  Laterality: N/A;  . ESOPHAGOGASTRODUODENOSCOPY (EGD) WITH PROPOFOL N/A 01/14/2015   Procedure: ESOPHAGOGASTRODUODENOSCOPY (EGD) WITH PROPOFOL;  Surgeon: Manya Silvas, MD;  Location: [facility] ENDOSCOPY;  Service: Endoscopy;  Laterality: N/A;  . IRRIGATION AND DEBRIDEMENT FOOT Left 03/13/2018  Procedure: IRRIGATION AND DEBRIDEMENT FOOT WITH BONE BIOPSY WITH MISONIX DEBRIDER;  Surgeon: Evelina Bucy, DPM;  Location: [place];  Service: Podiatry;  Laterality: Left;  . IRRIGATION AND DEBRIDEMENT FOOT Left 12/06/2018   Procedure: Irrigation And Debridement Foot;  Surgeon: Edrick Kins, DPM;  Location: [place];  Service: Podiatry;  Laterality: Left;  . LEG AMPUTATION BELOW KNEE Left   . LUMBAR WOUND DEBRIDEMENT N/A 10/01/2015   Procedure: LUMBAR WOUND DEBRIDEMENT;  Surgeon: Ashok Pall, MD;  Location: [place] NEURO ORS;  Service: Neurosurgery;  Laterality: N/A;  LUMBAR WOUND DEBRIDEMENT  . NASAL SINUS SURGERY    . SAVORY DILATION N/A 01/14/2015   Procedure: SAVORY DILATION;  Surgeon: Manya Silvas, MD;  Location: [facility] ENDOSCOPY;  Service: Endoscopy;  Laterality: N/A;  . TONSILLECTOMY    . WISDOM TOOTH EXTRACTION      There were no vitals filed for this visit.   Subjective Assessment - 01/01/20 1104    Subjective She has used hydrocortisone on limb and helps. She also tried contrast bath for hands and uncertain if helps but will keep trying.    Pertinent History L TTA, DM, CVA, sleep apnea, PNA, neuropathy bil. 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 Yes    Pain Score 4     Pain Location Hand    Pain Orientation Right;Left    Pain Descriptors / Indicators Tingling;Aching    Pain Onset 1 to 4 weeks ago    Pain Frequency Constant    Aggravating Factors  later in day most days,    Pain Relieving Factors crossing arms in front of chest,  wearing gloves                             [facility] Adult PT Treatment/Exercise - 01/01/20 1105      Transfers   Transfers Sit to Stand;Stand to [employer] Transfers    Sit to Stand 5: Supervision;With upper extremity assist;With armrests;From chair/3-in-1;Other (comment)   to single forearm crutch   Sit to Stand Details Visual cues for safe use of DME/AE;Verbal cues for precautions/safety;Verbal cues for technique    Stand to Sit 5: Supervision;With upper extremity assist;With armrests;To chair/3-in-1   from single forearm crutch   Stand to Sit Details (indicate cue type and reason) Visual cues for safe use of DME/AE;Verbal cues for precautions/safety;Verbal cues for technique      Ambulation/Gait   Ambulation/Gait Yes    Ambulation/Gait Assistance 5: Supervision;4: Min assist   supervsiion bilateral crutches & MinA single crutch   Ambulation/Gait  Assistance Details tactile & verbal cues on balance reactions, upright posture and step length.     Ambulation Distance (Feet) 175 Feet   30' & 40' single crutch, 175' 2 crtuches   Assistive device Prosthesis;Lofstrands;Rollator;R Forearm Crutch   enter/ exit with rollator   Gait Pattern Step-through pattern;Decreased arm swing - left;Decreased step length - right;Decreased stance time - left;Antalgic;Trunk flexed    Ambulation Surface Level;Indoor    Stairs Yes    Stairs Assistance 5: Supervision    Stairs Assistance Details (indicate cue type and reason) demo & verbal cues on negotiating stairs with single rail & single forearm crutch carrying  2nd crutch.      Stair Management Technique One rail Right;One rail Left;With crutches;Step to pattern;Forwards   3 steps rt rail/lt crutch & 3 steps lt rail/rt crutch   Number of Stairs 6   3 right rail & 3 left rail   Height of Stairs 6    Ramp 5: Supervision   bilateral crutches, prosthesis & AFO   Ramp Details (indicate cue type and reason) supervision for safety. Pt able to verbalize proper technique    Curb 5: Supervision   bilaeral forearm crutches, prosthesis & AFO   Curb Details (indicate cue type and reason) supervision for safety. Pt able to verbalize proper technique      Self-Care   Heat/Ice Application --      Knee/Hip Exercises: Aerobic   Nustep level 6 with BUEs & BLEs 5 minutes PT cues on technique & full motion    PT provided patient education while patient using Nustep.    PT educated patient while working on [employer]     Knee/Hip Exercises: Health visitor Extension 10# BLEs 10 reps 2 sets Batca    Cybex Knee Flexion Batca seated hamstring curls 20# BLEs 10 reps 2 sets    Cybex Leg Press Shuttle leg press BLEs 87# 10 reps 2 sets      Prosthetics   Prosthetic Care Comments  continue use of Hydrocortisone cream to rash on limb.     Current prosthetic wear tolerance (days/week)  daily    Current prosthetic  wear tolerance (#hours/day)  prosthesis most of awake hours drying 4x/day    Residual limb condition  no open areas, light pink circular under liner area,     Education Provided Skin check;Residual limb care;Proper Donning;Prosthetic cleaning;Proper wear schedule/adjustment;Other (comment)   see prosthetic care comments   Person(s) Educated Patient    Education Method Explanation;Verbal cues    Education Method Verbalized understanding;Needs further instruction    Donning Prosthesis Supervision    Doffing Prosthesis Modified independent (device/increased time)                    PT Short Term Goals - 12/18/19 1732      PT SHORT TERM GOAL #1   Title Patient demonstrates and verbalizes understanding of updated HEP.   (All STGs Target Date: 01/15/2020)    Time 4    Period Weeks    Status New    Target Date 01/15/20      PT SHORT TERM GOAL #2   Title Patient verbalizes understanding signs & management of sweating with prosthetic wear & use.    Time 4    Period Weeks    Status New    Target Date 01/15/20      PT SHORT TERM GOAL #3   Title Timed Up & Go with rollator walker, AFO & prosthesis <20 seconds.    Time 4    Period Weeks    Status New    Target Date 01/15/20      PT SHORT TERM GOAL #4   Title Patient ambulates 23' around furniture / obstacles with single forearm crutches & prosthesis with minimal guard.    Time 4    Period Weeks    Status New    Target Date 01/15/20      PT SHORT TERM GOAL #5   Title Patient negotiates ramps & curbs with 2 forearm crutches, AFO & prosthesis with supervision.    Time 4  Period Weeks    Status Revised    Target Date 01/15/20             PT Long Term Goals - 12/18/19 1759      PT LONG TERM GOAL #1   Title Patient verbalizes & demonstrates proper prosthetic care and tolerates wear >90% of awake hours without skin issues to enable safe utilization of prosthesis.  (All LTGs Target Date 12/19/2019)    Time 12    Period  Weeks    Status Revised    Target Date 03/11/20      PT LONG TERM GOAL #2   Title Tasks of [place] Balance Test with single forearm crutch support >36/56 indicating improved function with standing ADLs    Time 12    Period Weeks    Status New    Target Date 03/11/20      PT LONG TERM GOAL #3   Title Patient ambulates 500' with rollator walker or 2 forearm crutches with 50' on grass with AFO & prosthesis modified independent to enable community mobility.    Time 12    Period Weeks    Status Revised    Target Date 03/11/20      PT LONG TERM GOAL #4   Title Dynamic Gait Index with 2 forearm crutches >/= 14/24    Time 12    Period Weeks    Status New    Target Date 03/11/20      PT LONG TERM GOAL #5   Title Patient negotiates ramps & curbs with 2 forearm crutches or rollator walker & prosthesis modified independent for community access.    Time 12    Period Weeks    Status On-going    Target Date 03/11/20      PT LONG TERM GOAL #6   Title Patient ambulates 73' around furniture carrying item in free UE with single forearm crutch, prosthesis & AFO modified independent.    Time 12    Period Weeks    Status Revised    Target Date 03/11/20      PT LONG TERM GOAL #7   Title Timed Up & Go with single forearm crutch <35 seconds.    Time 12    Period Weeks    Status New    Target Date 03/11/20                 Plan - 01/01/20 1858    Clinical Impression Statement Patient;s rash is improved with use of hydrocortisone in evenings on rash.  Dr Sharol Given placed order for bilateral forearm crutches.  PT worked on household type gait with single forearm crutch and community with 2 crutches with improved level of function.  PT also continues to work on strength & endruance.    Personal Factors and Comorbidities Comorbidity 3+;Fitness;Time since onset of injury/illness/exacerbation    Comorbidities L TTA, DM, CVA, sleep apnea, PNA, neuropathy bil. feet and hand, HTN, s/p cervical fusion,  s/p back surgery, IBS, asthma, Covid in Jan 2021    Examination-Activity Limitations Hygiene/Grooming;Locomotion Level;Reach Overhead;Stairs;Stand;Transfers    Examination-Participation Restrictions Recruitment consultant Evolving/Moderate complexity    Rehab Potential Good    PT Frequency 2x / week    PT Duration 12 weeks    PT Treatment/Interventions ADLs/Self Care Home Management;DME Instruction;Gait training;Stair training;Functional mobility training;Therapeutic activities;Therapeutic exercise;Balance training;Neuromuscular re-education;Patient/family education;Orthotic Fit/Training;Prosthetic Training;Vestibular;Manual techniques    PT Next Visit Plan check rash on residual limb, work towards updated  STGs, balance & gait activities, therapuetic exercise    PT Home Exercise Plan Access Code: [email or web address]    at sink for mini squats with UE support, weight shifts with UE support, and balance feet apart no Ue support all with chair behind her    Consulted and Agree with Plan of Care Patient           Patient will benefit from skilled therapeutic intervention in order to improve the following deficits and impairments:  Abnormal gait, Decreased activity tolerance, Decreased balance, Decreased endurance, Decreased knowledge of use of DME, Decreased mobility, Decreased range of motion, Decreased safety awareness, Decreased strength, Increased edema, Impaired flexibility, Impaired sensation, Postural dysfunction, Prosthetic Dependency, Obesity, Pain  Visit Diagnosis: Unsteadiness on feet  Other abnormalities of gait and mobility  Abnormal posture  History of fall  Muscle weakness (generalized)  Stiffness of left knee, not elsewhere classified     Problem List Patient Active Problem List   Diagnosis Date Noted  . Allergy to statin medication 12/01/2019  . History of below knee amputation, left ([place]) 07/30/2019  . Gangrene of left foot ([place])  05/16/2019  . Subacute osteomyelitis of left foot ([place])   . Charcot's joint of foot, left   . Post-operative state   . PAD (peripheral artery disease) ([place]) 12/05/2018  . Osteomyelitis of ankle or foot, acute, left ([place]) 12/05/2018  . MRSA bacteremia 12/05/2018  . Sepsis without acute organ dysfunction ([place])   . Charcot's joint of foot due to diabetes ([place]) 09/18/2018  . BMI 36.0-36.9,adult   . Morbid obesity ([place]) 11/19/2017  . B12 deficiency 11/01/2017  . Diabetic foot ulcer ([place]) 12/14/2016  . Diabetic polyneuropathy associated with type 2 diabetes mellitus ([place]) 12/13/2015  . Lumbar stenosis with neurogenic claudication 09/02/2015  . Chronic constipation 07/13/2015  . OSA (obstructive sleep apnea) 06/25/2015  . 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/23/2015  . Hypertriglyceridemia 04/23/2015  . Asthma, mild intermittent 02/01/2015  . Type 2 diabetes mellitus with diabetic nephropathy ([place]) 12/31/2014  . Gastroesophageal reflux disease with esophagitis 12/31/2014  . Dysphagia 12/31/2014  . Bilateral carpal tunnel syndrome 12/01/2014  . Diverticulosis of colon (without mention of hemorrhage) 11/30/2012  . Essential hypertension, benign 11/28/2012    Jamey Reas PT, DPT 01/01/2020, 7:13 PM  [facility] [address] [place], [place], [address] Phone: [phone]   Fax:  [phone]  Name: Olivia Romero MRN: [id number] Date of Birth: 21-Nov-1955

## 2020-01-06 ENCOUNTER — Encounter: Payer: Self-pay | Admitting: Physical Therapy

## 2020-01-06 ENCOUNTER — Ambulatory Visit (INDEPENDENT_AMBULATORY_CARE_PROVIDER_SITE_OTHER): Payer: Medicare Other | Admitting: Physical Therapy

## 2020-01-06 DIAGNOSIS — R2689 Other abnormalities of gait and mobility: Secondary | ICD-10-CM | POA: Diagnosis not present

## 2020-01-06 DIAGNOSIS — R2681 Unsteadiness on feet: Secondary | ICD-10-CM

## 2020-01-06 DIAGNOSIS — R293 Abnormal posture: Secondary | ICD-10-CM | POA: Diagnosis not present

## 2020-01-06 DIAGNOSIS — Z9181 History of falling: Secondary | ICD-10-CM | POA: Diagnosis not present

## 2020-01-06 DIAGNOSIS — R531 Weakness: Secondary | ICD-10-CM

## 2020-01-06 DIAGNOSIS — M6281 Muscle weakness (generalized): Secondary | ICD-10-CM

## 2020-01-06 DIAGNOSIS — M25662 Stiffness of left knee, not elsewhere classified: Secondary | ICD-10-CM

## 2020-01-06 NOTE — Therapy (Signed)
[facility] Physical Therapy [address] [place], [place], [address] Phone: [phone]   Fax:  [phone]  Physical Therapy Treatment  Patient Details  Name: Olivia Romero MRN: [id number] Date of Birth: [birth year] Referring Provider (PT): Meridee Score, MD   Encounter Date: 01/06/2020   PT End of Session - 01/06/20 1312    Visit Number 27    Number of Visits 84    Date for PT Re-Evaluation 03/11/20    Authorization Type [employer] Medicare, $30 co-pay, no visit limit    Progress Note Due on Visit 30    PT Start Time 1300    PT Stop Time 1340    PT Time Calculation (min) 40 min    Equipment Utilized During Treatment Gait belt    Activity Tolerance Patient tolerated treatment well    Behavior During Therapy [facility] for tasks assessed/performed           Past Medical History:  Diagnosis Date  . Anemia   . Arthritis   . Asthma   . Colon polyps    adenomatous  . Diabetes mellitus without complication ([place])   . Diabetic infection of left foot ([place]) 12/2018  . Diverticulosis of colon   . Esophagitis   . GERD (gastroesophageal reflux disease)   . Hemorrhoid    internal  . Hyperlipemia   . Hypertension   . IBS (irritable bowel syndrome)    no current prob - diet controlled  . Myalgia due to statin 11/19/2017  . Neuropathy   . Neuropathy of both feet   . Neuropathy of hand   . Pneumonia    x 4  . PONV (postoperative nausea and vomiting)    on some surgeries but not all procedures  . Skin ulcer of right ankle, limited to breakdown of skin ([place]) 12/31/2016   resolved per patient 05/14/19  . Sleep apnea    has had in the past lost 50 pounds and do longer uses cpap  . Statin intolerance 04/23/2015  . Stroke [facility]) 2009   mini stroke per patient  . Stroke-like episode 2009   TIA - mini stroke per patient  . 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 Uncontrolled type 2 diabetes mellitus with hyperglycemia ([place]) 11/01/2017     Past Surgical History:  Procedure Laterality Date  . ABDOMINAL HYSTERECTOMY    . AMPUTATION Left 12/06/2018   Procedure: PARTIAL AMPUTATION LEFT FOOT;  Surgeon: Edrick Kins, DPM;  Location: [place];  Service: Podiatry;  Laterality: Left;  . AMPUTATION Left 05/16/2019   Procedure: LEFT BELOW KNEE AMPUTATION;  Surgeon: Newt Minion, MD;  Location: [place];  Service: Orthopedics;  Laterality: Left;  . BACK SURGERY  09/02/2015  . BONE BIOPSY Left 12/06/2018   Procedure: Bone Biopsy;  Surgeon: Edrick Kins, DPM;  Location: [place];  Service: Podiatry;  Laterality: Left;  . CERVICAL FUSION    . CESAREAN SECTION  1986  . COLONOSCOPY N/A 11/30/2012   Procedure: COLONOSCOPY;  Surgeon: Irene Shipper, MD;  Location: [facility] ENDOSCOPY;  Service: Endoscopy;  Laterality: N/A;  . COLONOSCOPY WITH PROPOFOL N/A 01/03/2016   Procedure: COLONOSCOPY WITH PROPOFOL;  Surgeon: Manya Silvas, MD;  Location: [facility] ENDOSCOPY;  Service: Endoscopy;  Laterality: N/A;  . ESOPHAGOGASTRODUODENOSCOPY (EGD) WITH PROPOFOL N/A 01/14/2015   Procedure: ESOPHAGOGASTRODUODENOSCOPY (EGD) WITH PROPOFOL;  Surgeon: Manya Silvas, MD;  Location: [facility] ENDOSCOPY;  Service: Endoscopy;  Laterality: N/A;  . IRRIGATION AND DEBRIDEMENT FOOT Left 03/13/2018  Procedure: IRRIGATION AND DEBRIDEMENT FOOT WITH BONE BIOPSY WITH MISONIX DEBRIDER;  Surgeon: Evelina Bucy, DPM;  Location: [place];  Service: Podiatry;  Laterality: Left;  . IRRIGATION AND DEBRIDEMENT FOOT Left 12/06/2018   Procedure: Irrigation And Debridement Foot;  Surgeon: Edrick Kins, DPM;  Location: [place];  Service: Podiatry;  Laterality: Left;  . LEG AMPUTATION BELOW KNEE Left   . LUMBAR WOUND DEBRIDEMENT N/A 10/01/2015   Procedure: LUMBAR WOUND DEBRIDEMENT;  Surgeon: Ashok Pall, MD;  Location: [place] NEURO ORS;  Service: Neurosurgery;  Laterality: N/A;  LUMBAR WOUND DEBRIDEMENT  . NASAL SINUS SURGERY    . SAVORY DILATION N/A 01/14/2015   Procedure: SAVORY DILATION;  Surgeon: Manya Silvas, MD;  Location: [facility] ENDOSCOPY;  Service: Endoscopy;  Laterality: N/A;  . TONSILLECTOMY    . WISDOM TOOTH EXTRACTION      There were no vitals filed for this visit.   Subjective Assessment - 01/06/20 1312    Subjective She says rash looks a lot better, not much pain overall today but she does feel tired    Pertinent History L TTA, DM, CVA, sleep apnea, PNA, neuropathy bil. 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, in community    Pain Onset 1 to 4 weeks ago             [facility] Adult PT Treatment/Exercise - 01/06/20 0001      Transfers   Transfers Sit to Stand;Stand to [employer] Transfers    Sit to Stand 5: Supervision;With upper extremity assist;With armrests;From chair/3-in-1;Other (comment)    Sit to Stand Details Visual cues for safe use of DME/AE;Verbal cues for precautions/safety;Verbal cues for technique    Stand to Sit 5: Supervision;With upper extremity assist;With armrests;To chair/3-in-1    Stand to Sit Details (indicate cue type and reason) Visual cues for safe use of DME/AE;Verbal cues for precautions/safety;Verbal cues for technique      Ambulation/Gait   Ambulation/Gait Yes    Ambulation/Gait Assistance 5: Supervision    Ambulation Distance (Feet) 150 Feet   then 75   Assistive device R Forearm Crutch;L Forearm Crutch;Prosthesis    Gait Pattern Step-through pattern;Decreased arm swing - left;Decreased step length - right;Decreased stance time - left;Antalgic;Trunk flexed      Knee/Hip Exercises: Aerobic   Nustep L6 X 10 min UE/LE      Knee/Hip Exercises: Machines for Strengthening   Cybex Knee Extension 10# BLEs 10 reps 2 sets Batca    Cybex Knee Flexion Batca seated hamstring curls 20# BLEs 10 reps 2 sets    Cybex Leg Press  Shuttle leg press BLEs 87# 10 reps 3 sets                    PT Short Term Goals - 12/18/19 1732      PT SHORT TERM GOAL #1   Title Patient demonstrates and verbalizes understanding of updated HEP.   (All STGs Target Date: 01/15/2020)    Time 4    Period Weeks    Status New    Target Date 01/15/20      PT SHORT TERM GOAL #2   Title Patient verbalizes understanding signs & management of sweating with prosthetic wear & use.    Time 4    Period Weeks  Status New    Target Date 01/15/20      PT SHORT TERM GOAL #3   Title Timed Up & Go with rollator walker, AFO & prosthesis <20 seconds.    Time 4    Period Weeks    Status New    Target Date 01/15/20      PT SHORT TERM GOAL #4   Title Patient ambulates 59' around furniture / obstacles with single forearm crutches & prosthesis with minimal guard.    Time 4    Period Weeks    Status New    Target Date 01/15/20      PT SHORT TERM GOAL #5   Title Patient negotiates ramps & curbs with 2 forearm crutches, AFO & prosthesis with supervision.    Time 4    Period Weeks    Status Revised    Target Date 01/15/20             PT Long Term Goals - 12/18/19 1759      PT LONG TERM GOAL #1   Title Patient verbalizes & demonstrates proper prosthetic care and tolerates wear >90% of awake hours without skin issues to enable safe utilization of prosthesis.  (All LTGs Target Date 12/19/2019)    Time 12    Period Weeks    Status Revised    Target Date 03/11/20      PT LONG TERM GOAL #2   Title Tasks of [place] Balance Test with single forearm crutch support >36/56 indicating improved function with standing ADLs    Time 12    Period Weeks    Status New    Target Date 03/11/20      PT LONG TERM GOAL #3   Title Patient ambulates 500' with rollator walker or 2 forearm crutches with 50' on grass with AFO & prosthesis modified independent to enable community mobility.    Time 12    Period Weeks    Status Revised    Target Date  03/11/20      PT LONG TERM GOAL #4   Title Dynamic Gait Index with 2 forearm crutches >/= 14/24    Time 12    Period Weeks    Status New    Target Date 03/11/20      PT LONG TERM GOAL #5   Title Patient negotiates ramps & curbs with 2 forearm crutches or rollator walker & prosthesis modified independent for community access.    Time 12    Period Weeks    Status On-going    Target Date 03/11/20      PT LONG TERM GOAL #6   Title Patient ambulates 67' around furniture carrying item in free UE with single forearm crutch, prosthesis & AFO modified independent.    Time 12    Period Weeks    Status Revised    Target Date 03/11/20      PT LONG TERM GOAL #7   Title Timed Up & Go with single forearm crutch <35 seconds.    Time 12    Period Weeks    Status New    Target Date 03/11/20                 Plan - 01/06/20 1336    Clinical Impression Statement Rash has improved, session focused on gait, endurance, and bilat leg strength. Due to fatigue she preferred to ambulate with bilat forearm crutches vs single. PT will continue to progress her as able.    Personal  Factors and Comorbidities Comorbidity 3+;Fitness;Time since onset of injury/illness/exacerbation    Comorbidities L TTA, DM, CVA, sleep apnea, PNA, neuropathy bil. feet and hand, HTN, s/p cervical fusion, s/p back surgery, IBS, asthma, Covid in Jan 2021    Examination-Activity Limitations Hygiene/Grooming;Locomotion Level;Reach Overhead;Stairs;Stand;Transfers    Examination-Participation Restrictions Recruitment consultant Evolving/Moderate complexity    Rehab Potential Good    PT Frequency 2x / week    PT Duration 12 weeks    PT Treatment/Interventions ADLs/Self Care Home Management;DME Instruction;Gait training;Stair training;Functional mobility training;Therapeutic activities;Therapeutic exercise;Balance training;Neuromuscular re-education;Patient/family education;Orthotic  Fit/Training;Prosthetic Training;Vestibular;Manual techniques    PT Next Visit Plan check rash on residual limb, work towards updated STGs, balance & gait activities, therapuetic exercise    PT Home Exercise Plan Access Code: [email or web address]    at sink for mini squats with UE support, weight shifts with UE support, and balance feet apart no Ue support all with chair behind her    Consulted and Agree with Plan of Care Patient           Patient will benefit from skilled therapeutic intervention in order to improve the following deficits and impairments:  Abnormal gait, Decreased activity tolerance, Decreased balance, Decreased endurance, Decreased knowledge of use of DME, Decreased mobility, Decreased range of motion, Decreased safety awareness, Decreased strength, Increased edema, Impaired flexibility, Impaired sensation, Postural dysfunction, Prosthetic Dependency, Obesity, Pain  Visit Diagnosis: Unsteadiness on feet  Other abnormalities of gait and mobility  Abnormal posture  History of fall  Muscle weakness (generalized)  Stiffness of left knee, not elsewhere classified  Weakness generalized     Problem List Patient Active Problem List   Diagnosis Date Noted  . Allergy to statin medication 12/01/2019  . History of below knee amputation, left ([place]) 07/30/2019  . Gangrene of left foot ([place]) 05/16/2019  . Subacute osteomyelitis of left foot ([place])   . Charcot's joint of foot, left   . Post-operative state   . PAD (peripheral artery disease) ([place]) 12/05/2018  . Osteomyelitis of ankle or foot, acute, left ([place]) 12/05/2018  . MRSA bacteremia 12/05/2018  . Sepsis without acute organ dysfunction ([place])   . Charcot's joint of foot due to diabetes ([place]) 09/18/2018  . BMI 36.0-36.9,adult   . Morbid obesity ([place]) 11/19/2017  . B12 deficiency 11/01/2017  . Diabetic foot ulcer ([place]) 12/14/2016  . Diabetic polyneuropathy associated with type 2 diabetes mellitus ([place]) 12/13/2015  . Lumbar  stenosis with neurogenic claudication 09/02/2015  . Chronic constipation 07/13/2015  . OSA (obstructive sleep apnea) 06/25/2015  . 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/23/2015  . Hypertriglyceridemia 04/23/2015  . Asthma, mild intermittent 02/01/2015  . Type 2 diabetes mellitus with diabetic nephropathy ([place]) 12/31/2014  . Gastroesophageal reflux disease with esophagitis 12/31/2014  . Dysphagia 12/31/2014  . Bilateral carpal tunnel syndrome 12/01/2014  . Diverticulosis of colon (without mention of hemorrhage) 11/30/2012  . Essential hypertension, benign 11/28/2012    Silvestre Mesi 01/06/2020, 1:42 PM  [facility] Physical Therapy [address] [place], [place], [address] Phone: [phone]   Fax:  [phone]  Name: CICILIA CLINGER MRN: [id number] Date of Birth: 12-03-1955

## 2020-01-08 ENCOUNTER — Encounter: Payer: Self-pay | Admitting: Physical Therapy

## 2020-01-08 ENCOUNTER — Other Ambulatory Visit: Payer: Self-pay

## 2020-01-08 ENCOUNTER — Ambulatory Visit (INDEPENDENT_AMBULATORY_CARE_PROVIDER_SITE_OTHER): Payer: Medicare Other | Admitting: Physical Therapy

## 2020-01-08 DIAGNOSIS — M25662 Stiffness of left knee, not elsewhere classified: Secondary | ICD-10-CM

## 2020-01-08 DIAGNOSIS — R2689 Other abnormalities of gait and mobility: Secondary | ICD-10-CM

## 2020-01-08 DIAGNOSIS — R293 Abnormal posture: Secondary | ICD-10-CM

## 2020-01-08 DIAGNOSIS — M6281 Muscle weakness (generalized): Secondary | ICD-10-CM

## 2020-01-08 DIAGNOSIS — Z9181 History of falling: Secondary | ICD-10-CM

## 2020-01-08 DIAGNOSIS — R2681 Unsteadiness on feet: Secondary | ICD-10-CM | POA: Diagnosis not present

## 2020-01-08 NOTE — Therapy (Signed)
[facility] [address] [place], [place], [address] Phone: [phone]   Fax:  [phone]  Physical Therapy Treatment  Patient Details  Name: Olivia Romero MRN: [id number] Date of Birth: Aug 20, 1955 Referring Provider (PT): Meridee Score, MD   Encounter Date: 01/08/2020   PT End of Session - 01/08/20 1336    Visit Number 28    Number of Visits 20    Date for PT Re-Evaluation 03/11/20    Authorization Type [employer] Medicare, $30 co-pay, no visit limit    Progress Note Due on Visit 30    PT Start Time 1300    PT Stop Time 1338    PT Time Calculation (min) 38 min    Equipment Utilized During Treatment Gait belt    Activity Tolerance Patient tolerated treatment well    Behavior During Therapy WFL for tasks assessed/performed           Past Medical History:  Diagnosis Date  . Anemia   . Arthritis   . Asthma   . Colon polyps    adenomatous  . Diabetes mellitus without complication ([place])   . Diabetic infection of left foot ([place]) 12/2018  . Diverticulosis of colon   . Esophagitis   . GERD (gastroesophageal reflux disease)   . Hemorrhoid    internal  . Hyperlipemia   . Hypertension   . IBS (irritable bowel syndrome)    no current prob - diet controlled  . Myalgia due to statin 11/19/2017  . Neuropathy   . Neuropathy of both feet   . Neuropathy of hand   . Pneumonia    x 4  . PONV (postoperative nausea and vomiting)    on some surgeries but not all procedures  . Skin ulcer of right ankle, limited to breakdown of skin ([place]) 12/31/2016   resolved per patient 05/14/19  . Sleep apnea    has had in the past lost 50 pounds and do longer uses cpap  . Statin intolerance 04/23/2015  . Stroke [facility]) 2009   mini stroke per patient  . Stroke-like episode 2009   TIA - mini stroke per patient  . 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 ([place]) 07/13/2015  . Uncontrolled type 2 diabetes mellitus with hyperglycemia ([place]) 11/01/2017     Past Surgical History:  Procedure Laterality Date  . ABDOMINAL HYSTERECTOMY    . AMPUTATION Left 12/06/2018   Procedure: PARTIAL AMPUTATION LEFT FOOT;  Surgeon: Edrick Kins, DPM;  Location: [place];  Service: Podiatry;  Laterality: Left;  . AMPUTATION Left 05/16/2019   Procedure: LEFT BELOW KNEE AMPUTATION;  Surgeon: Newt Minion, MD;  Location: [place];  Service: Orthopedics;  Laterality: Left;  . BACK SURGERY  09/02/2015  . BONE BIOPSY Left 12/06/2018   Procedure: Bone Biopsy;  Surgeon: Edrick Kins, DPM;  Location: [place];  Service: Podiatry;  Laterality: Left;  . CERVICAL FUSION    . CESAREAN SECTION  1986  . COLONOSCOPY N/A 11/30/2012   Procedure: COLONOSCOPY;  Surgeon: Irene Shipper, MD;  Location: [facility]COPY;  Service: Endoscopy;  Laterality: N/A;  . COLONOSCOPY WITH PROPOFOL N/A 01/03/2016   Procedure: COLONOSCOPY WITH PROPOFOL;  Surgeon: Manya Silvas, MD;  Location: [place] [facility];  Service: Endoscopy;  Laterality: N/A;  . ESOPHAGOGASTRODUODENOSCOPY (EGD) WITH PROPOFOL N/A 01/14/2015   Procedure: ESOPHAGOGASTRODUODENOSCOPY (EGD) WITH PROPOFOL;  Surgeon: Manya Silvas, MD;  Location: [facility] ENDOSCOPY;  Service: Endoscopy;  Laterality: N/A;  . IRRIGATION AND DEBRIDEMENT FOOT Left 03/13/2018  Procedure: IRRIGATION AND DEBRIDEMENT FOOT WITH BONE BIOPSY WITH MISONIX DEBRIDER;  Surgeon: Evelina Bucy, DPM;  Location: [place];  Service: Podiatry;  Laterality: Left;  . IRRIGATION AND DEBRIDEMENT FOOT Left 12/06/2018   Procedure: Irrigation And Debridement Foot;  Surgeon: Edrick Kins, DPM;  Location: [place];  Service: Podiatry;  Laterality: Left;  . LEG AMPUTATION BELOW KNEE Left   . LUMBAR WOUND DEBRIDEMENT N/A 10/01/2015   Procedure: LUMBAR WOUND DEBRIDEMENT;  Surgeon: Ashok Pall, MD;  Location: [place] [facility];  Service: Neurosurgery;  Laterality: N/A;  LUMBAR WOUND DEBRIDEMENT  . NASAL SINUS SURGERY    . SAVORY DILATION N/A 01/14/2015   Procedure: SAVORY DILATION;  Surgeon: Manya Silvas, MD;  Location: [facility] ENDOSCOPY;  Service: Endoscopy;  Laterality: N/A;  . TONSILLECTOMY    . WISDOM TOOTH EXTRACTION      There were no vitals filed for this visit.   Subjective Assessment - 01/08/20 1308    Subjective She continues to report fatigue. She says she got her bloodwork back and she is deficient in B12 and Vit D. Other than that denies any pain or complaints with prosthesis    Pertinent History L TTA, DM, CVA, sleep apnea, PNA, neuropathy bil. 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, in community    Pain Onset 1 to 4 weeks ago             [facility] Adult PT Treatment/Exercise - 01/08/20 1323      Transfers   Transfers Sit to Stand;Stand to [employer] Transfers    Sit to Stand 5: Supervision;With upper extremity assist;With armrests;From chair/3-in-1;Other (comment)    Sit to Stand Details Visual cues for safe use of DME/AE;Verbal cues for precautions/safety;Verbal cues for technique    Stand to Sit 5: Supervision;With upper extremity assist;With armrests;To chair/3-in-1    Stand to Sit Details (indicate cue type and reason) Visual cues for safe use of DME/AE;Verbal cues for precautions/safety;Verbal cues for technique      Ambulation/Gait   Ambulation/Gait Yes    Ambulation/Gait Assistance 5: Supervision    Ambulation Distance (Feet) 150 Feet   then 75   Assistive device R Forearm Crutch;L Forearm Crutch;Prosthesis    Gait Pattern Step-through pattern;Decreased arm swing - left;Decreased step length - right;Decreased stance time - left;Antalgic;Trunk flexed    Ramp 5: Supervision    Ramp Details (indicate cue type and reason) using bilat Forarm crutches    Curb 5: Supervision    Curb Details (indicate cue type and reason) using bilat  forearm cruches, 6.5 inch      Knee/Hip Exercises: Aerobic   Nustep L6 X 7 min UE/LE then one minute rest, then one more minute      Knee/Hip Exercises: Machines for Strengthening   Cybex Knee Extension 5# BLEs 10 reps (was too fatiqued to do what she had done previous session with wt and reps)    Cybex Knee Flexion [place] seated hamstring curls 20# BLEs 15 reps    Cybex Leg Press Shuttle leg press BLEs 87# 10 reps 3 sets                    PT Short Term Goals - 12/18/19 1732      PT SHORT TERM GOAL #1  Title Patient demonstrates and verbalizes understanding of updated HEP.   (All STGs Target Date: 01/15/2020)    Time 4    Period Weeks    Status New    Target Date 01/15/20      PT SHORT TERM GOAL #2   Title Patient verbalizes understanding signs & management of sweating with prosthetic wear & use.    Time 4    Period Weeks    Status New    Target Date 01/15/20      PT SHORT TERM GOAL #3   Title Timed Up & Go with rollator walker, AFO & prosthesis <20 seconds.    Time 4    Period Weeks    Status New    Target Date 01/15/20      PT SHORT TERM GOAL #4   Title Patient ambulates 32' around furniture / obstacles with single forearm crutches & prosthesis with minimal guard.    Time 4    Period Weeks    Status New    Target Date 01/15/20      PT SHORT TERM GOAL #5   Title Patient negotiates ramps & curbs with 2 forearm crutches, AFO & prosthesis with supervision.    Time 4    Period Weeks    Status Revised    Target Date 01/15/20             PT Long Term Goals - 12/18/19 1759      PT LONG TERM GOAL #1   Title Patient verbalizes & demonstrates proper prosthetic care and tolerates wear >90% of awake hours without skin issues to enable safe utilization of prosthesis.  (All LTGs Target Date 12/19/2019)    Time 12    Period Weeks    Status Revised    Target Date 03/11/20      PT LONG TERM GOAL #2   Title Tasks of [place] Balance Test with single forearm crutch  support >36/56 indicating improved function with standing ADLs    Time 12    Period Weeks    Status New    Target Date 03/11/20      PT LONG TERM GOAL #3   Title Patient ambulates 500' with rollator walker or 2 forearm crutches with 50' on grass with AFO & prosthesis modified independent to enable community mobility.    Time 12    Period Weeks    Status Revised    Target Date 03/11/20      PT LONG TERM GOAL #4   Title Dynamic Gait Index with 2 forearm crutches >/= 14/24    Time 12    Period Weeks    Status New    Target Date 03/11/20      PT LONG TERM GOAL #5   Title Patient negotiates ramps & curbs with 2 forearm crutches or rollator walker & prosthesis modified independent for community access.    Time 12    Period Weeks    Status On-going    Target Date 03/11/20      PT LONG TERM GOAL #6   Title Patient ambulates 4' around furniture carrying item in free UE with single forearm crutch, prosthesis & AFO modified independent.    Time 12    Period Weeks    Status Revised    Target Date 03/11/20      PT LONG TERM GOAL #7   Title Timed Up & Go with single forearm crutch <35 seconds.    Time 12  Period Weeks    Status New    Target Date 03/11/20                 Plan - 01/08/20 1336    Clinical Impression Statement Due to fatigue, not able to perform normal reps/resistance today on weight machines. She was however able to negotiate ramp and curb with bilat forearm crutches requiring only supervision.    Personal Factors and Comorbidities Comorbidity 3+;Fitness;Time since onset of injury/illness/exacerbation    Comorbidities L TTA, DM, CVA, sleep apnea, PNA, neuropathy bil. feet and hand, HTN, s/p cervical fusion, s/p back surgery, IBS, asthma, Covid in Jan 2021    Examination-Activity Limitations Hygiene/Grooming;Locomotion Level;Reach Overhead;Stairs;Stand;Transfers    Examination-Participation Restrictions Engineer, materials Evolving/Moderate complexity    Rehab Potential Good    PT Frequency 2x / week    PT Duration 12 weeks    PT Treatment/Interventions ADLs/Self Care Home Management;DME Instruction;Gait training;Stair training;Functional mobility training;Therapeutic activities;Therapeutic exercise;Balance training;Neuromuscular re-education;Patient/family education;Orthotic Fit/Training;Prosthetic Training;Vestibular;Manual techniques    PT Next Visit Plan check rash on residual limb, work towards updated STGs, balance & gait activities, therapuetic exercise    PT Home Exercise Plan Access Code: [email or web address]    at sink for mini squats with UE support, weight shifts with UE support, and balance feet apart no Ue support all with chair behind her    Consulted and Agree with Plan of Care Patient           Patient will benefit from skilled therapeutic intervention in order to improve the following deficits and impairments:  Abnormal gait, Decreased activity tolerance, Decreased balance, Decreased endurance, Decreased knowledge of use of DME, Decreased mobility, Decreased range of motion, Decreased safety awareness, Decreased strength, Increased edema, Impaired flexibility, Impaired sensation, Postural dysfunction, Prosthetic Dependency, Obesity, Pain  Visit Diagnosis: Unsteadiness on feet  Other abnormalities of gait and mobility  Abnormal posture  History of fall  Muscle weakness (generalized)  Stiffness of left knee, not elsewhere classified     Problem List Patient Active Problem List   Diagnosis Date Noted  . Allergy to statin medication 12/01/2019  . History of below knee amputation, left ([place]) 07/30/2019  . Gangrene of left foot ([place]) 05/16/2019  . Subacute osteomyelitis of left foot ([place])   . Charcot's joint of foot, left   . Post-operative state   . PAD (peripheral artery disease) ([place]) 12/05/2018  . Osteomyelitis of ankle or foot, acute, left ([place]) 12/05/2018  . MRSA  bacteremia 12/05/2018  . Sepsis without acute organ dysfunction ([place])   . Charcot's joint of foot due to diabetes ([place]) 09/18/2018  . BMI 36.0-36.9,adult   . Morbid obesity ([place]) 11/19/2017  . B12 deficiency 11/01/2017  . Diabetic foot ulcer ([place]) 12/14/2016  . Diabetic polyneuropathy associated with type 2 diabetes mellitus ([place]) 12/13/2015  . Lumbar stenosis with neurogenic claudication 09/02/2015  . Chronic constipation 07/13/2015  . OSA (obstructive sleep apnea) 06/25/2015  . 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/23/2015  . Hypertriglyceridemia 04/23/2015  . Asthma, mild intermittent 02/01/2015  . Type 2 diabetes mellitus with diabetic nephropathy ([place]) 12/31/2014  . Gastroesophageal reflux disease with esophagitis 12/31/2014  . Dysphagia 12/31/2014  . Bilateral carpal tunnel syndrome 12/01/2014  . Diverticulosis of colon (without mention of hemorrhage) 11/30/2012  . Essential hypertension, benign 11/28/2012    Silvestre Mesi 01/08/2020, 1:42 PM  [facility] Physical Therapy [address] [place], [place], [address] Phone: [phone]   Fax:  [phone]  Name: Olivia Romero MRN: [id number] Date of Birth: May 08, 1956

## 2020-01-12 ENCOUNTER — Ambulatory Visit: Payer: Medicare Other | Admitting: Podiatry

## 2020-01-13 ENCOUNTER — Encounter: Payer: Self-pay | Admitting: Physical Therapy

## 2020-01-13 ENCOUNTER — Ambulatory Visit (INDEPENDENT_AMBULATORY_CARE_PROVIDER_SITE_OTHER): Payer: Medicare Other | Admitting: Physical Therapy

## 2020-01-13 DIAGNOSIS — M6281 Muscle weakness (generalized): Secondary | ICD-10-CM

## 2020-01-13 DIAGNOSIS — R293 Abnormal posture: Secondary | ICD-10-CM | POA: Diagnosis not present

## 2020-01-13 DIAGNOSIS — R2681 Unsteadiness on feet: Secondary | ICD-10-CM

## 2020-01-13 DIAGNOSIS — R2689 Other abnormalities of gait and mobility: Secondary | ICD-10-CM | POA: Diagnosis not present

## 2020-01-13 DIAGNOSIS — Z9181 History of falling: Secondary | ICD-10-CM

## 2020-01-13 DIAGNOSIS — M25662 Stiffness of left knee, not elsewhere classified: Secondary | ICD-10-CM

## 2020-01-13 NOTE — Therapy (Signed)
[facility] [place], [place], [address] Phone: [phone]   Fax:  [phone]  Physical Therapy Treatment  Patient Details  Name: Olivia Romero MRN: [id number] Date of Birth: 1956-04-03 Referring Provider (PT): Meridee Score, MD   Encounter Date: 01/13/2020   PT End of Session - 01/13/20 0917    Visit Number 29    Number of Visits 35    Date for PT Re-Evaluation 03/11/20    Authorization Type [employer] Medicare, $30 co-pay, no visit limit    Progress Note Due on Visit 30    PT Start Time 0920    PT Stop Time 1005    PT Time Calculation (min) 45 min    Equipment Utilized During Treatment Gait belt    Activity Tolerance Patient tolerated treatment well    Behavior During Therapy WFL for tasks assessed/performed           Past Medical History:  Diagnosis Date   Anemia    Arthritis    Asthma    Colon polyps    adenomatous   Diabetes mellitus without complication ([place])    Diabetic infection of left foot ([place]) 12/2018   Diverticulosis of colon    Esophagitis    GERD (gastroesophageal reflux disease)    Hemorrhoid    internal   Hyperlipemia    Hypertension    IBS (irritable bowel syndrome)    no current prob - diet controlled   Myalgia due to statin 11/19/2017   Neuropathy    Neuropathy of both feet    Neuropathy of hand    Pneumonia    x 4   PONV (postoperative nausea and vomiting)    on some surgeries but not all procedures   Skin ulcer of right ankle, limited to breakdown of skin ([place]) 12/31/2016   resolved per patient 05/14/19   Sleep apnea    has had in the past lost 50 pounds and do longer uses cpap   Statin intolerance 04/23/2015   Stroke ([place]) 2009   mini stroke per patient   Stroke-like episode 2009   TIA - mini stroke per patient   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 ([place]) 07/13/2015   Uncontrolled type 2 diabetes mellitus with hyperglycemia ([place]) 11/01/2017     Past Surgical History:  Procedure Laterality Date   ABDOMINAL HYSTERECTOMY     AMPUTATION Left 12/06/2018   Procedure: PARTIAL AMPUTATION LEFT FOOT;  Surgeon: Edrick Kins, DPM;  Location: [facility];  Service: Podiatry;  Laterality: Left;   AMPUTATION Left 05/16/2019   Procedure: LEFT BELOW KNEE AMPUTATION;  Surgeon: Newt Minion, MD;  Location: [facility];  Service: Orthopedics;  Laterality: Left;   BACK SURGERY  09/02/2015   BONE BIOPSY Left 12/06/2018   Procedure: Bone Biopsy;  Surgeon: Edrick Kins, DPM;  Location: [facility];  Service: Podiatry;  Laterality: Left;   [place]   COLONOSCOPY N/A 11/30/2012   Procedure: COLONOSCOPY;  Surgeon: Irene Shipper, MD;  Location: [facility] ENDOSCOPY;  Service: Endoscopy;  Laterality: N/A;   COLONOSCOPY WITH PROPOFOL N/A 01/03/2016   Procedure: COLONOSCOPY WITH PROPOFOL;  Surgeon: Manya Silvas, MD;  Location: [facility] ENDOSCOPY;  Service: Endoscopy;  Laterality: N/A;   ESOPHAGOGASTRODUODENOSCOPY (EGD) WITH PROPOFOL N/A 01/14/2015   Procedure: ESOPHAGOGASTRODUODENOSCOPY (EGD) WITH PROPOFOL;  Surgeon: Manya Silvas, MD;  Location: [facility] ENDOSCOPY;  Service: Endoscopy;  Laterality: N/A;   IRRIGATION AND DEBRIDEMENT FOOT Left 03/13/2018  Procedure: IRRIGATION AND DEBRIDEMENT FOOT WITH BONE BIOPSY WITH MISONIX DEBRIDER;  Surgeon: Evelina Bucy, DPM;  Location: [facility];  Service: Podiatry;  Laterality: Left;   IRRIGATION AND DEBRIDEMENT FOOT Left 12/06/2018   Procedure: Irrigation And Debridement Foot;  Surgeon: Edrick Kins, DPM;  Location: [facility];  Service: Podiatry;  Laterality: Left;   LEG AMPUTATION BELOW KNEE Left    LUMBAR WOUND DEBRIDEMENT N/A 10/01/2015   Procedure: LUMBAR WOUND DEBRIDEMENT;  Surgeon: Ashok Pall, MD;  Location: [place] NEURO ORS;  Service: Neurosurgery;  Laterality: N/A;  LUMBAR WOUND DEBRIDEMENT   NASAL SINUS SURGERY     SAVORY DILATION N/A 01/14/2015   Procedure: SAVORY DILATION;  Surgeon: Manya Silvas, MD;  Location[facility] ENDOSCOPY;  Service: Endoscopy;  Laterality: N/A;   TONSILLECTOMY     WISDOM TOOTH EXTRACTION      There were no vitals filed for this visit.   Subjective Assessment - 01/13/20 0918    Subjective Her rash is much improved but comes back when sweats a lot but quickly disappates. She is low on Vitamin D and B12. She has started taking prescription.    Pertinent History L TTA, DM, CVA, sleep apnea, PNA, neuropathy bil. 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 No/denies    Pain Onset 1 to 4 weeks ago                             [facility] Adult PT Treatment/Exercise - 01/13/20 0920      Transfers   Transfers Sit to Stand;Stand to Sit;Stand Pivot Transfers    Sit to Stand 5: Supervision;With upper extremity assist;With armrests;From chair/3-in-1;Other (comment)   to forearm crutch(es) or rollator walker   Sit to Stand Details --    Stand to Sit 5: Supervision;With upper extremity assist;With armrests;To chair/3-in-1;Other (comment)   from forearm crutch(es) or rollator walker   Stand to Sit Details (indicate cue type and reason) --      Ambulation/Gait   Ambulation/Gait Yes    Ambulation/Gait Assistance 5: Supervision;4: Min assist   MinA single crutch & supervision 2 crutches or rollator   Ambulation/Gait Assistance Details tactile cues on balance reactions. Verbal cues on sequence, not staring at floor creating flexed posture and step width    Ambulation Distance (Feet) 150 Feet   150' 2 forearm crutches, 20' X 3 single crutch   Assistive device R Forearm Crutch;Prosthesis;Lofstrands;Rollator   enter / exit rollator & worked on Geophysical data processor Step-through pattern;Decreased arm  swing - left;Decreased step length - right;Decreased stance time - left;Antalgic;Trunk flexed    Ambulation Surface Level;Indoor    Ramp 5: Supervision   2 crutches & prosthesis   Ramp Details (indicate cue type and reason) supervision for safety. Pt able to verbalize proper technique    Curb 5: Supervision   crutches & prosthesis   Curb Details (indicate cue type and reason) supervision for safety. Pt able to verbalize proper technique    Gait Comments worked on negotiating around obstacles with single crutch      Knee/Hip Exercises: Aerobic   Nustep L6 X 7 min BUEs & BLEs       Knee/Hip Exercises:  Machines for Strengthening   Cybex Knee Extension Batca 5# BLEs 10 reps    Cybex Knee Flexion Batca seated hamstring curls 20# BLEs 15 reps    Cybex Leg Press Shuttle leg press BLEs 87# 15 reps 2 sets      Prosthetics   Current prosthetic wear tolerance (days/week)  daily    Current prosthetic wear tolerance (#hours/day)  prosthesis most of awake hours drying 3-4x/day    Edema pitting    Residual limb condition  no open areas    Education Provided Skin check;Residual limb care;Proper Donning;Prosthetic cleaning;Proper wear schedule/adjustment    Person(s) Educated Patient    Education Method Explanation;Verbal cues    Education Method Verbalized understanding;Verbal cues required    Donning Prosthesis Supervision    Doffing Prosthesis Modified independent (device/increased time)                    PT Short Term Goals - 01/13/20 1007      PT SHORT TERM GOAL #1   Title Patient demonstrates and verbalizes understanding of updated HEP.   (All STGs Target Date: 01/15/2020)    Baseline MET 01/13/2020    Time 4    Period Weeks    Status Achieved    Target Date 01/15/20      PT SHORT TERM GOAL #2   Title Patient verbalizes understanding signs & management of sweating with prosthetic wear & use.    Baseline MET 01/13/2020    Time 4    Period Weeks    Status Achieved    Target  Date 01/15/20      PT SHORT TERM GOAL #3   Title Timed Up & Go with rollator walker, AFO & prosthesis <20 seconds.    Time 4    Period Weeks    Status On-going    Target Date 01/15/20      PT SHORT TERM GOAL #4   Title Patient ambulates 74' around furniture / obstacles with single forearm crutches & prosthesis with minimal guard.    Time 4    Period Weeks    Status On-going    Target Date 01/15/20      PT SHORT TERM GOAL #5   Title Patient negotiates ramps & curbs with 2 forearm crutches, AFO & prosthesis with supervision.    Time 4    Period Weeks    Status On-going    Target Date 01/15/20             PT Long Term Goals - 01/13/20 1008      PT LONG TERM GOAL #1   Title Patient verbalizes & demonstrates proper prosthetic care and tolerates wear >90% of awake hours without skin issues to enable safe utilization of prosthesis.  (All LTGs Target Date 12/19/2019)    Time 12    Period Weeks    Status On-going    Target Date 03/11/20      PT LONG TERM GOAL #2   Title Tasks of [place] Balance Test with single forearm crutch support >36/56 indicating improved function with standing ADLs    Time 12    Period Weeks    Status On-going    Target Date 03/11/20      PT LONG TERM GOAL #3   Title Patient ambulates 500' with rollator walker or 2 forearm crutches with 50' on grass with AFO & prosthesis modified independent to enable community mobility.    Time 12    Period Weeks  Status On-going    Target Date 03/11/20      PT LONG TERM GOAL #4   Title Dynamic Gait Index with 2 forearm crutches >/= 14/24    Time 12    Period Weeks    Status On-going    Target Date 03/11/20      PT LONG TERM GOAL #5   Title Patient negotiates ramps & curbs with 2 forearm crutches or rollator walker & prosthesis modified independent for community access.    Time 12    Period Weeks    Status On-going    Target Date 03/11/20      PT LONG TERM GOAL #6   Title Patient ambulates 57' around  furniture carrying item in free UE with single forearm crutch, prosthesis & AFO modified independent.    Time 12    Period Weeks    Status On-going    Target Date 03/11/20      PT LONG TERM GOAL #7   Title Timed Up & Go with single forearm crutch <35 seconds.    Time 12    Period Weeks    Status On-going    Target Date 03/11/20                 Plan - 01/13/20 0917    Clinical Impression Statement PT session focused on therapeutic exercise for strength & muscle endurance. PT also working on gait & balance with single forearm crutch for household activities. She met STG #1 & #2.    Personal Factors and Comorbidities Comorbidity 3+;Fitness;Time since onset of injury/illness/exacerbation    Comorbidities L TTA, DM, CVA, sleep apnea, PNA, neuropathy bil. feet and hand, HTN, s/p cervical fusion, s/p back surgery, IBS, asthma, Covid in Jan 2021    Examination-Activity Limitations Hygiene/Grooming;Locomotion Level;Reach Overhead;Stairs;Stand;Transfers    Examination-Participation Restrictions Recruitment consultant Evolving/Moderate complexity    Rehab Potential Good    PT Frequency 2x / week    PT Duration 12 weeks    PT Treatment/Interventions ADLs/Self Care Home Management;DME Instruction;Gait training;Stair training;Functional mobility training;Therapeutic activities;Therapeutic exercise;Balance training;Neuromuscular re-education;Patient/family education;Orthotic Fit/Training;Prosthetic Training;Vestibular;Manual techniques    PT Next Visit Plan Do 10th visit note. check rash on residual limb, check remaining updated STGs, balance & gait activities, therapuetic exercise    PT Home Exercise Plan Access Code: [email or web address]    at sink for mini squats with UE support, weight shifts with UE support, and balance feet apart no Ue support all with chair behind her    Consulted and Agree with Plan of Care Patient           Patient will benefit from  skilled therapeutic intervention in order to improve the following deficits and impairments:  Abnormal gait, Decreased activity tolerance, Decreased balance, Decreased endurance, Decreased knowledge of use of DME, Decreased mobility, Decreased range of motion, Decreased safety awareness, Decreased strength, Increased edema, Impaired flexibility, Impaired sensation, Postural dysfunction, Prosthetic Dependency, Obesity, Pain  Visit Diagnosis: Other abnormalities of gait and mobility  Unsteadiness on feet  Abnormal posture  History of fall  Muscle weakness (generalized)  Stiffness of left knee, not elsewhere classified     Problem List Patient Active Problem List   Diagnosis Date Noted   Allergy to statin medication 12/01/2019   History of below knee amputation, left ([place]) 07/30/2019   Gangrene of left foot ([place]) 05/16/2019   Subacute osteomyelitis of left foot ([place])    Charcot's joint of foot, left    Post-operative  state    PAD (peripheral artery disease) ([place]) 12/05/2018   Osteomyelitis of ankle or foot, acute, left ([place]) 12/05/2018   MRSA bacteremia 12/05/2018   Sepsis without acute organ dysfunction ([place])    Charcot's joint of foot due to diabetes ([place]) 09/18/2018   BMI 36.0-36.9,adult    Morbid obesity ([place]) 11/19/2017   B12 deficiency 11/01/2017   Diabetic foot ulcer ([place]) 12/14/2016   Diabetic polyneuropathy associated with type 2 diabetes mellitus ([place]) 12/13/2015   Lumbar stenosis with neurogenic claudication 09/02/2015   Chronic constipation 07/13/2015   OSA (obstructive sleep apnea) 06/25/2015   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/23/2015   Hypertriglyceridemia 04/23/2015   Asthma, mild intermittent 02/01/2015   Type 2 diabetes mellitus with diabetic nephropathy ([place]) 12/31/2014   Gastroesophageal reflux disease with esophagitis 12/31/2014   Dysphagia 12/31/2014   Bilateral carpal tunnel syndrome 12/01/2014    Diverticulosis of colon (without mention of hemorrhage) 11/30/2012   Essential hypertension, benign 11/28/2012    Jamey Reas PT, DPT 01/13/2020, 3:02 PM  [facility] Physical Therapy [address] [place], [place], [address] Phone: [phone]   Fax:  [phone]  Name: JOSSELIN GAULIN MRN: [id number] Date of Birth: 1955-12-02

## 2020-01-15 ENCOUNTER — Encounter: Payer: Self-pay | Admitting: Physical Therapy

## 2020-01-15 ENCOUNTER — Ambulatory Visit (INDEPENDENT_AMBULATORY_CARE_PROVIDER_SITE_OTHER): Payer: Medicare Other | Admitting: Physical Therapy

## 2020-01-15 ENCOUNTER — Other Ambulatory Visit: Payer: Self-pay

## 2020-01-15 DIAGNOSIS — M25662 Stiffness of left knee, not elsewhere classified: Secondary | ICD-10-CM

## 2020-01-15 DIAGNOSIS — R2681 Unsteadiness on feet: Secondary | ICD-10-CM | POA: Diagnosis not present

## 2020-01-15 DIAGNOSIS — R2689 Other abnormalities of gait and mobility: Secondary | ICD-10-CM

## 2020-01-15 DIAGNOSIS — R293 Abnormal posture: Secondary | ICD-10-CM | POA: Diagnosis not present

## 2020-01-15 DIAGNOSIS — Z9181 History of falling: Secondary | ICD-10-CM | POA: Diagnosis not present

## 2020-01-15 DIAGNOSIS — M6281 Muscle weakness (generalized): Secondary | ICD-10-CM

## 2020-01-15 NOTE — Therapy (Signed)
[facility] [address] [place], [place], [address] Phone: [phone]   Fax:  [phone]  Physical Therapy Treatment/Progress note  Progress Note reporting period 12/11/19 to 01/15/20  See below for objective and subjective measurements relating to patients progress with PT.   Patient Details  Name: Olivia Romero MRN: [id number] Date of Birth: Jul 11, 1955 Referring Provider (PT): Meridee Score, MD   Encounter Date: 01/15/2020   PT End of Session - 01/15/20 1157    Visit Number 30    Number of Visits 91    Date for PT Re-Evaluation 03/11/20    Authorization Type [employer] Medicare, $30 co-pay, no visit limit    Progress Note Due on Visit 40    PT Start Time 1100    PT Stop Time 1145    PT Time Calculation (min) 45 min    Equipment Utilized During Treatment Gait belt    Activity Tolerance Patient tolerated treatment well    Behavior During Therapy WFL for tasks assessed/performed           Past Medical History:  Diagnosis Date  . Anemia   . Arthritis   . Asthma   . Colon polyps    adenomatous  . Diabetes mellitus without complication ([place])   . Diabetic infection of left foot ([place]) 12/2018  . Diverticulosis of colon   . Esophagitis   . GERD (gastroesophageal reflux disease)   . Hemorrhoid    internal  . Hyperlipemia   . Hypertension   . IBS (irritable bowel syndrome)    no current prob - diet controlled  . Myalgia due to statin 11/19/2017  . Neuropathy   . Neuropathy of both feet   . Neuropathy of hand   . Pneumonia    x 4  . PONV (postoperative nausea and vomiting)    on some surgeries but not all procedures  . Skin ulcer of right ankle, limited to breakdown of skin ([place]) 12/31/2016   resolved per patient 05/14/19  . Sleep apnea    has had in the past lost 50 pounds and do longer uses cpap  . Statin intolerance 04/23/2015  . Stroke [facility]) 2009   mini stroke per patient  . Stroke-like episode 2009   TIA - mini stroke per  patient  . 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 ([place]) 07/13/2015  . Uncontrolled type 2 diabetes mellitus with hyperglycemia ([place]) 11/01/2017    Past Surgical History:  Procedure Laterality Date  . ABDOMINAL HYSTERECTOMY    . AMPUTATION Left 12/06/2018   Procedure: PARTIAL AMPUTATION LEFT FOOT;  Surgeon: Edrick Kins, DPM;  Location: [facility];  Service: Podiatry;  Laterality: Left;  . AMPUTATION Left 05/16/2019   Procedure: LEFT BELOW KNEE AMPUTATION;  Surgeon: Newt Minion, MD;  Location: [place];  Service: Orthopedics;  Laterality: Left;  . BACK SURGERY  09/02/2015  . BONE BIOPSY Left 12/06/2018   Procedure: Bone Biopsy;  Surgeon: Edrick Kins, DPM;  Location: [place];  Service: Podiatry;  Laterality: Left;  . CERVICAL FUSION    . CESAREAN SECTION  1986  . COLONOSCOPY N/A 11/30/2012   Procedure: COLONOSCOPY;  Surgeon: Irene Shipper, MD;  Location: [facility] ENDOSCOPY;  Service: Endoscopy;  Laterality: N/A;  . COLONOSCOPY WITH PROPOFOL N/A 01/03/2016   Procedure: COLONOSCOPY WITH PROPOFOL;  Surgeon: Manya Silvas, MD;  Location: [facility] ENDOSCOPY;  Service: Endoscopy;  Laterality: N/A;  . ESOPHAGOGASTRODUODENOSCOPY (EGD) WITH PROPOFOL N/A 01/14/2015   Procedure: ESOPHAGOGASTRODUODENOSCOPY (EGD) WITH PROPOFOL;  Surgeon: Manya Silvas, MD;  Location: [facility] ENDOSCOPY;  Service: Endoscopy;  Laterality: N/A;  . IRRIGATION AND DEBRIDEMENT FOOT Left 03/13/2018   Procedure: IRRIGATION AND DEBRIDEMENT FOOT WITH BONE BIOPSY WITH MISONIX DEBRIDER;  Surgeon: Evelina Bucy, DPM;  Location: [place];  Service: Podiatry;  Laterality: Left;  . IRRIGATION AND DEBRIDEMENT FOOT Left 12/06/2018   Procedure: Irrigation And Debridement Foot;  Surgeon: Edrick Kins, DPM;  Location: [place];  Service: Podiatry;  Laterality: Left;  . LEG AMPUTATION BELOW KNEE Left   . LUMBAR WOUND DEBRIDEMENT N/A 10/01/2015   Procedure: LUMBAR WOUND DEBRIDEMENT;  Surgeon: Ashok Pall, MD;  Location: [facility];  Service:  Neurosurgery;  Laterality: N/A;  LUMBAR WOUND DEBRIDEMENT  . NASAL SINUS SURGERY    . SAVORY DILATION N/A 01/14/2015   Procedure: SAVORY DILATION;  Surgeon: Manya Silvas, MD;  Location: [facility] ENDOSCOPY;  Service: Endoscopy;  Laterality: N/A;  . TONSILLECTOMY    . WISDOM TOOTH EXTRACTION      There were no vitals filed for this visit.   Subjective Assessment - 01/15/20 1151    Subjective relays no pain, still fatigued but not as bad    Pertinent History L TTA, DM, CVA, sleep apnea, PNA, neuropathy bil. 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, in community    Pain Onset 1 to 4 weeks ago                             [facility] Adult PT Treatment/Exercise - 01/15/20 0001      Transfers   Transfers Sit to Stand;Stand to [employer] Transfers    Sit to Stand 5: Supervision;With upper extremity assist;With armrests;From chair/3-in-1;Other (comment)   with Forearm crutches   Stand to Sit 5: Supervision;With upper extremity assist;With armrests;To chair/3-in-1;Other (comment)      Ambulation/Gait   Ambulation/Gait Yes    Ambulation/Gait Assistance 5: Supervision;4: Min assist    Ambulation/Gait Assistance Details cues to begin Rt crutch and Lt arm    Ambulation Distance (Feet) 150 Feet   with bilat forearm crutches then 20 ft X 4 with single   Gait Comments carried bottle last round of ambulation       Exercises   Other Exercises  Nu step L5-4 for 8 minutes for endurance. Leg press 87 lbs 3X15 reps                    PT Short Term Goals - 01/15/20 1159      PT SHORT TERM GOAL #1   Title s    Baseline MET 01/13/2020    Time 4    Period Weeks    Status Achieved    Target Date 01/15/20      PT SHORT TERM GOAL #2   Title Patient  verbalizes understanding signs & management of sweating with prosthetic wear & use.    Baseline MET 01/13/2020    Time 4    Period Weeks    Status Achieved    Target Date 01/15/20      PT SHORT TERM GOAL #3   Title Timed Up & Go with rollator walker, AFO & prosthesis <20 seconds.    Time 4    Period Weeks  Status On-going    Target Date 01/15/20      PT SHORT TERM GOAL #4   Title Patient ambulates 20' around furniture / obstacles with single forearm crutches & prosthesis with minimal guard.    Time 4    Period Weeks    Status Achieved    Target Date 01/15/20      PT SHORT TERM GOAL #5   Title Patient negotiates ramps & curbs with 2 forearm crutches, AFO & prosthesis with supervision.    Time 4    Period Weeks    Status Achieved    Target Date 01/15/20             PT Long Term Goals - 01/13/20 1008      PT LONG TERM GOAL #1   Title Patient verbalizes & demonstrates proper prosthetic care and tolerates wear >90% of awake hours without skin issues to enable safe utilization of prosthesis.  (All LTGs Target Date 12/19/2019)    Time 12    Period Weeks    Status On-going    Target Date 03/11/20      PT LONG TERM GOAL #2   Title Tasks of [place] Balance Test with single forearm crutch support >36/56 indicating improved function with standing ADLs    Time 12    Period Weeks    Status On-going    Target Date 03/11/20      PT LONG TERM GOAL #3   Title Patient ambulates 500' with rollator walker or 2 forearm crutches with 50' on grass with AFO & prosthesis modified independent to enable community mobility.    Time 12    Period Weeks    Status On-going    Target Date 03/11/20      PT LONG TERM GOAL #4   Title Dynamic Gait Index with 2 forearm crutches >/= 14/24    Time 12    Period Weeks    Status On-going    Target Date 03/11/20      PT LONG TERM GOAL #5   Title Patient negotiates ramps & curbs with 2 forearm crutches or rollator walker & prosthesis modified  independent for community access.    Time 12    Period Weeks    Status On-going    Target Date 03/11/20      PT LONG TERM GOAL #6   Title Patient ambulates 45' around furniture carrying item in free UE with single forearm crutch, prosthesis & AFO modified independent.    Time 12    Period Weeks    Status On-going    Target Date 03/11/20      PT LONG TERM GOAL #7   Title Timed Up & Go with single forearm crutch <35 seconds.    Time 12    Period Weeks    Status On-going    Target Date 03/11/20                 Plan - 01/15/20 1200    Clinical Impression Statement She has not met all short term goals and is progressing well overall with PT. She will continue to benefit from PT to address her functional deficits in gait, balance, strength.    Personal Factors and Comorbidities Comorbidity 3+;Fitness;Time since onset of injury/illness/exacerbation    Comorbidities L TTA, DM, CVA, sleep apnea, PNA, neuropathy bil. feet and hand, HTN, s/p cervical fusion, s/p back surgery, IBS, asthma, Covid in Jan 2021    Examination-Activity Limitations Hygiene/Grooming;Locomotion Level;Reach Overhead;Stairs;Stand;Transfers  Examination-Participation Restrictions Community Activity;Volunteer    Stability/Clinical Decision Making Evolving/Moderate complexity    Rehab Potential Good    PT Frequency 2x / week    PT Duration 12 weeks    PT Treatment/Interventions ADLs/Self Care Home Management;DME Instruction;Gait training;Stair training;Functional mobility training;Therapeutic activities;Therapeutic exercise;Balance training;Neuromuscular re-education;Patient/family education;Orthotic Fit/Training;Prosthetic Training;Vestibular;Manual techniques    PT Next Visit Plan check rash on residual limb, check remaining updated STGs, balance & gait activities, therapuetic exercise    PT Home Exercise Plan Access Code: [email or web address]    at sink for mini squats with UE support, weight shifts with UE support, and  balance feet apart no Ue support all with chair behind her    Consulted and Agree with Plan of Care Patient           Patient will benefit from skilled therapeutic intervention in order to improve the following deficits and impairments:  Abnormal gait, Decreased activity tolerance, Decreased balance, Decreased endurance, Decreased knowledge of use of DME, Decreased mobility, Decreased range of motion, Decreased safety awareness, Decreased strength, Increased edema, Impaired flexibility, Impaired sensation, Postural dysfunction, Prosthetic Dependency, Obesity, Pain  Visit Diagnosis: Other abnormalities of gait and mobility  Unsteadiness on feet  Abnormal posture  History of fall  Muscle weakness (generalized)  Stiffness of left knee, not elsewhere classified     Problem List Patient Active Problem List   Diagnosis Date Noted  . Allergy to statin medication 12/01/2019  . History of below knee amputation, left ([place]) 07/30/2019  . Gangrene of left foot ([place]) 05/16/2019  . Subacute osteomyelitis of left foot ([place])   . Charcot's joint of foot, left   . Post-operative state   . PAD (peripheral artery disease) ([place]) 12/05/2018  . Osteomyelitis of ankle or foot, acute, left ([place]) 12/05/2018  . MRSA bacteremia 12/05/2018  . Sepsis without acute organ dysfunction ([place])   . Charcot's joint of foot due to diabetes ([place]) 09/18/2018  . BMI 36.0-36.9,adult   . Morbid obesity ([place]) 11/19/2017  . B12 deficiency 11/01/2017  . Diabetic foot ulcer ([place]) 12/14/2016  . Diabetic polyneuropathy associated with type 2 diabetes mellitus ([place]) 12/13/2015  . Lumbar stenosis with neurogenic claudication 09/02/2015  . Chronic constipation 07/13/2015  . OSA (obstructive sleep apnea) 06/25/2015  . 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/23/2015  . Hypertriglyceridemia 04/23/2015  . Asthma, mild intermittent 02/01/2015  . Type 2 diabetes mellitus with diabetic nephropathy ([place]) 12/31/2014   . Gastroesophageal reflux disease with esophagitis 12/31/2014  . Dysphagia 12/31/2014  . Bilateral carpal tunnel syndrome 12/01/2014  . Diverticulosis of colon (without mention of hemorrhage) 11/30/2012  . Essential hypertension, benign 11/28/2012    Silvestre Mesi 01/15/2020, 12:01 PM  [facility] [address] [place], [place], [address] Phone: [phone]   Fax:  [phone]  Name: SAIDI SANTACROCE MRN: [id number] Date of Birth: [birth year]

## 2020-01-20 ENCOUNTER — Encounter: Payer: Medicare Other | Admitting: Physical Therapy

## 2020-01-22 ENCOUNTER — Other Ambulatory Visit: Payer: Self-pay

## 2020-01-22 ENCOUNTER — Ambulatory Visit (INDEPENDENT_AMBULATORY_CARE_PROVIDER_SITE_OTHER): Payer: Medicare Other | Admitting: Physical Therapy

## 2020-01-22 DIAGNOSIS — M25662 Stiffness of left knee, not elsewhere classified: Secondary | ICD-10-CM

## 2020-01-22 DIAGNOSIS — R2689 Other abnormalities of gait and mobility: Secondary | ICD-10-CM

## 2020-01-22 DIAGNOSIS — M6281 Muscle weakness (generalized): Secondary | ICD-10-CM

## 2020-01-22 DIAGNOSIS — R2681 Unsteadiness on feet: Secondary | ICD-10-CM

## 2020-01-22 DIAGNOSIS — R293 Abnormal posture: Secondary | ICD-10-CM | POA: Diagnosis not present

## 2020-01-22 DIAGNOSIS — Z9181 History of falling: Secondary | ICD-10-CM | POA: Diagnosis not present

## 2020-01-22 NOTE — Therapy (Signed)
[facility] [address] [place], [place], [address] Phone: [phone]   Fax:  [phone]  Physical Therapy Treatment  Patient Details  Name: Olivia Romero MRN: [id number] Date of Birth: May 26, 1956 Referring Provider (PT): Meridee Score, MD   Encounter Date: 01/22/2020   PT End of Session - 01/22/20 1152    Visit Number 31    Number of Visits 49    Date for PT Re-Evaluation 03/11/20    Authorization Type [employer] Medicare, $30 co-pay, no visit limit    Progress Note Due on Visit 40    PT Start Time 1100    PT Stop Time 1145    PT Time Calculation (min) 45 min    Equipment Utilized During Treatment Gait belt    Activity Tolerance Patient tolerated treatment well    Behavior During Therapy [facility] for tasks assessed/performed           Past Medical History:  Diagnosis Date  . Anemia   . Arthritis   . Asthma   . Colon polyps    adenomatous  . Diabetes mellitus without complication ([place])   . Diabetic infection of left foot ([place]) 12/2018  . Diverticulosis of colon   . Esophagitis   . GERD (gastroesophageal reflux disease)   . Hemorrhoid    internal  . Hyperlipemia   . Hypertension   . IBS (irritable bowel syndrome)    no current prob - diet controlled  . Myalgia due to statin 11/19/2017  . Neuropathy   . Neuropathy of both feet   . Neuropathy of hand   . Pneumonia    x 4  . PONV (postoperative nausea and vomiting)    on some surgeries but not all procedures  . Skin ulcer of right ankle, limited to breakdown of skin ([place]) 12/31/2016   resolved per patient 05/14/19  . Sleep apnea    has had in the past lost 50 pounds and do longer uses cpap  . Statin intolerance 04/23/2015  . Stroke [facility]) 2009   mini stroke per patient  . Stroke-like episode 2009   TIA - mini stroke per patient  . 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 Uncontrolled type 2 diabetes mellitus with hyperglycemia ([place]) 11/01/2017     Past Surgical History:  Procedure Laterality Date  . ABDOMINAL HYSTERECTOMY    . AMPUTATION Left 12/06/2018   Procedure: PARTIAL AMPUTATION LEFT FOOT;  Surgeon: Edrick Kins, DPM;  Location: [place];  Service: Podiatry;  Laterality: Left;  . AMPUTATION Left 05/16/2019   Procedure: LEFT BELOW KNEE AMPUTATION;  Surgeon: Newt Minion, MD;  Location: [place];  Service: Orthopedics;  Laterality: Left;  . BACK SURGERY  09/02/2015  . BONE BIOPSY Left 12/06/2018   Procedure: Bone Biopsy;  Surgeon: Edrick Kins, DPM;  Location: [place];  Service: Podiatry;  Laterality: Left;  . CERVICAL FUSION    . CESAREAN SECTION  1986  . COLONOSCOPY N/A 11/30/2012   Procedure: COLONOSCOPY;  Surgeon: Irene Shipper, MD;  Location: [facility];  Service: Endoscopy;  Laterality: N/A;  . COLONOSCOPY WITH PROPOFOL N/A 01/03/2016   Procedure: COLONOSCOPY WITH PROPOFOL;  Surgeon: Manya Silvas, MD;  Location: [facility]COPY;  Service: Endoscopy;  Laterality: N/A;  . ESOPHAGOGASTRODUODENOSCOPY (EGD) WITH PROPOFOL N/A 01/14/2015   Procedure: ESOPHAGOGASTRODUODENOSCOPY (EGD) WITH PROPOFOL;  Surgeon: Manya Silvas, MD;  Location: [facility] END[facility]COPY;  Service: Endoscopy;  Laterality: N/A;  . IRRIGATION AND DEBRIDEMENT FOOT Left 03/13/2018  Procedure: IRRIGATION AND DEBRIDEMENT FOOT WITH BONE BIOPSY WITH MISONIX DEBRIDER;  Surgeon: Evelina Bucy, DPM;  Location: [place];  Service: Podiatry;  Laterality: Left;  . IRRIGATION AND DEBRIDEMENT FOOT Left 12/06/2018   Procedure: Irrigation And Debridement Foot;  Surgeon: Edrick Kins, DPM;  Location: [place];  Service: Podiatry;  Laterality: Left;  . LEG AMPUTATION BELOW KNEE Left   . LUMBAR WOUND DEBRIDEMENT N/A 10/01/2015   Procedure: LUMBAR WOUND DEBRIDEMENT;  Surgeon: Ashok Pall, MD;  Location: [place] [facility];  Service: Neurosurgery;  Laterality: N/A;  LUMBAR WOUND DEBRIDEMENT  . NASAL SINUS SURGERY    . SAVORY DILATION N/A 01/14/2015   Procedure: SAVORY DILATION;  Surgeon: Manya Silvas, MD;  Location: [facility] ENDOSCOPY;  Service: Endoscopy;  Laterality: N/A;  . TONSILLECTOMY    . WISDOM TOOTH EXTRACTION      There were no vitals filed for this visit.   Subjective Assessment - 01/22/20 1114    Subjective she saw prosthetist who added pads and spacer due to anterior knee pain/pressure. She relays less pain now with this. She relays her energy levels are slowly getting better.    Pertinent History L TTA, DM, CVA, sleep apnea, PNA, neuropathy bil. 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, in community    Pain Onset 1 to 4 weeks ago              [facility] PT Assessment - 01/22/20 0001      Assessment   Medical Diagnosis Left Transtibial Amputation    Referring Provider (PT) Meridee Score, MD      Timed Up and Go Test   Normal TUG (seconds) 18   with rollator   TUG Comments 19.53 with bilat FA crutches, 28.5 with single FA crutch                         [facility] Adult PT Treatment/Exercise - 01/22/20 0001      Transfers   Transfers Sit to Stand;Stand to Sit;Stand Pivot Transfers    Sit to Stand 5: Supervision;With upper extremity assist;With armrests;From chair/3-in-1;Other (comment)   with Forearm crutches   Stand to Sit 5: Supervision;With upper extremity assist;With armrests;To chair/3-in-1;Other (comment)      Ambulation/Gait   Ambulation/Gait Yes    Ambulation/Gait Assistance 5: Supervision;4: Min assist    Ambulation Distance (Feet) 150 Feet   with bilat forearm crutches then 30 ft X 3 with single   Gait Comments carried bottle last round of ambulation       Exercises   Other Exercises  Nu step L5-4 for 10 minutes for endurance. Leg press 87 lbs 3X15 reps      Knee/Hip Exercises: Machines for Strengthening   Cybex  Leg Press Shuttle leg press BLEs 87# 15 reps 3 sets                    PT Short Term Goals - 01/22/20 1151      PT SHORT TERM GOAL #1   Title Patient demonstrates and verbalizes understanding of updated HEP. (All STGs Target Date: 01/15/2020)    Baseline MET 01/13/2020    Time 4    Period Weeks    Status Achieved    Target Date 01/15/20  PT SHORT TERM GOAL #2   Title Patient verbalizes understanding signs & management of sweating with prosthetic wear & use.    Baseline MET 01/13/2020    Time 4    Period Weeks    Status Achieved    Target Date 01/15/20      PT SHORT TERM GOAL #3   Title Timed Up & Go with rollator walker, AFO & prosthesis <20 seconds.    Baseline Met 8/19, 18.5 sec    Time 4    Period Weeks    Status Achieved    Target Date 01/15/20      PT SHORT TERM GOAL #4   Title Patient ambulates 3' around furniture / obstacles with single forearm crutches & prosthesis with minimal guard.    Time 4    Period Weeks    Status Achieved    Target Date 01/15/20      PT SHORT TERM GOAL #5   Title Patient negotiates ramps & curbs with 2 forearm crutches, AFO & prosthesis with supervision.    Time 4    Period Weeks    Status Achieved    Target Date 01/15/20             PT Long Term Goals - 01/13/20 1008      PT LONG TERM GOAL #1   Title Patient verbalizes & demonstrates proper prosthetic care and tolerates wear >90% of awake hours without skin issues to enable safe utilization of prosthesis.  (All LTGs Target Date 12/19/2019)    Time 12    Period Weeks    Status On-going    Target Date 03/11/20      PT LONG TERM GOAL #2   Title Tasks of [place] Balance Test with single forearm crutch support >36/56 indicating improved function with standing ADLs    Time 12    Period Weeks    Status On-going    Target Date 03/11/20      PT LONG TERM GOAL #3   Title Patient ambulates 500' with rollator walker or 2 forearm crutches with 50' on grass with AFO &  prosthesis modified independent to enable community mobility.    Time 12    Period Weeks    Status On-going    Target Date 03/11/20      PT LONG TERM GOAL #4   Title Dynamic Gait Index with 2 forearm crutches >/= 14/24    Time 12    Period Weeks    Status On-going    Target Date 03/11/20      PT LONG TERM GOAL #5   Title Patient negotiates ramps & curbs with 2 forearm crutches or rollator walker & prosthesis modified independent for community access.    Time 12    Period Weeks    Status On-going    Target Date 03/11/20      PT LONG TERM GOAL #6   Title Patient ambulates 10' around furniture carrying item in free UE with single forearm crutch, prosthesis & AFO modified independent.    Time 12    Period Weeks    Status On-going    Target Date 03/11/20      PT LONG TERM GOAL #7   Title Timed Up & Go with single forearm crutch <35 seconds.    Time 12    Period Weeks    Status On-going    Target Date 03/11/20  Plan - 01/22/20 1153    Clinical Impression Statement Met last remaining short term goal which was TUG test. Overall improving endurance, balance and strength with PT but still limited functionally by these. PT will continue to progress as able.    Personal Factors and Comorbidities Comorbidity 3+;Fitness;Time since onset of injury/illness/exacerbation    Comorbidities L TTA, DM, CVA, sleep apnea, PNA, neuropathy bil. feet and hand, HTN, s/p cervical fusion, s/p back surgery, IBS, asthma, Covid in Jan 2021    Examination-Activity Limitations Hygiene/Grooming;Locomotion Level;Reach Overhead;Stairs;Stand;Transfers    Examination-Participation Restrictions Recruitment consultant Evolving/Moderate complexity    Rehab Potential Good    PT Frequency 2x / week    PT Duration 12 weeks    PT Treatment/Interventions ADLs/Self Care Home Management;DME Instruction;Gait training;Stair training;Functional mobility  training;Therapeutic activities;Therapeutic exercise;Balance training;Neuromuscular re-education;Patient/family education;Orthotic Fit/Training;Prosthetic Training;Vestibular;Manual techniques    PT Next Visit Plan check rash on residual limb, check remaining updated STGs, balance & gait activities, therapuetic exercise    PT Home Exercise Plan Access Code: [email or web address]    at sink for mini squats with UE support, weight shifts with UE support, and balance feet apart no Ue support all with chair behind her    Consulted and Agree with Plan of Care Patient           Patient will benefit from skilled therapeutic intervention in order to improve the following deficits and impairments:  Abnormal gait, Decreased activity tolerance, Decreased balance, Decreased endurance, Decreased knowledge of use of DME, Decreased mobility, Decreased range of motion, Decreased safety awareness, Decreased strength, Increased edema, Impaired flexibility, Impaired sensation, Postural dysfunction, Prosthetic Dependency, Obesity, Pain  Visit Diagnosis: Other abnormalities of gait and mobility  Unsteadiness on feet  Abnormal posture  History of fall  Muscle weakness (generalized)  Stiffness of left knee, not elsewhere classified     Problem List Patient Active Problem List   Diagnosis Date Noted  . Allergy to statin medication 12/01/2019  . History of below knee amputation, left ([place]) 07/30/2019  . Gangrene of left foot ([place]) 05/16/2019  . Subacute osteomyelitis of left foot ([place])   . Charcot's joint of foot, left   . Post-operative state   . PAD (peripheral artery disease) ([place]) 12/05/2018  . Osteomyelitis of ankle or foot, acute, left ([place]) 12/05/2018  . MRSA bacteremia 12/05/2018  . Sepsis without acute organ dysfunction ([place])   . Charcot's joint of foot due to diabetes ([place]) 09/18/2018  . BMI 36.0-36.9,adult   . Morbid obesity ([place]) 11/19/2017  . B12 deficiency 11/01/2017  . Diabetic foot ulcer ([place])  12/14/2016  . Diabetic polyneuropathy associated with type 2 diabetes mellitus ([place]) 12/13/2015  . Lumbar stenosis with neurogenic claudication 09/02/2015  . Chronic constipation 07/13/2015  . OSA (obstructive sleep apnea) 06/25/2015  . 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/23/2015  . Hypertriglyceridemia 04/23/2015  . Asthma, mild intermittent 02/01/2015  . Type 2 diabetes mellitus with diabetic nephropathy ([place]) 12/31/2014  . Gastroesophageal reflux disease with esophagitis 12/31/2014  . Dysphagia 12/31/2014  . Bilateral carpal tunnel syndrome 12/01/2014  . Diverticulosis of colon (without mention of hemorrhage) 11/30/2012  . Essential hypertension, benign 11/28/2012    Silvestre Mesi 01/22/2020, 11:54 AM  [facility] [address] [place], [place], [address] Phone: [phone]   Fax:  [phone]  Name: Olivia Romero MRN: [id number] Date of Birth: [birth year]

## 2020-01-27 ENCOUNTER — Other Ambulatory Visit: Payer: Self-pay

## 2020-01-27 ENCOUNTER — Encounter: Payer: Self-pay | Admitting: Physical Therapy

## 2020-01-27 ENCOUNTER — Ambulatory Visit (INDEPENDENT_AMBULATORY_CARE_PROVIDER_SITE_OTHER): Payer: Medicare Other | Admitting: Physical Therapy

## 2020-01-27 DIAGNOSIS — M25662 Stiffness of left knee, not elsewhere classified: Secondary | ICD-10-CM

## 2020-01-27 DIAGNOSIS — R2681 Unsteadiness on feet: Secondary | ICD-10-CM

## 2020-01-27 DIAGNOSIS — R2689 Other abnormalities of gait and mobility: Secondary | ICD-10-CM | POA: Diagnosis not present

## 2020-01-27 DIAGNOSIS — R293 Abnormal posture: Secondary | ICD-10-CM

## 2020-01-27 DIAGNOSIS — M6281 Muscle weakness (generalized): Secondary | ICD-10-CM

## 2020-01-27 DIAGNOSIS — Z9181 History of falling: Secondary | ICD-10-CM | POA: Diagnosis not present

## 2020-01-27 NOTE — Patient Instructions (Signed)
Access Code: [email or web address] URL: [email or web address] Date: 01/27/2020 Prepared by: Jamey Reas  Exercises Seated Hip External Rotation Stretch - 1-3 x daily - 7 x weekly - 1 sets - 2-3 reps - 10-20 seconds hold Seated Knee Extension Stretch with Chair - 1-3 x daily - 7 x weekly - 1 sets - 2-3 reps - 10-20 seconds hold Seated Flexion Stretch - 1-3 x daily - 7 x weekly - 1 sets - 2-3 reps - 10-20 seconds hold Upright Stance at Door Frame Single Arm - 1-3 x daily - 7 x weekly - 1 sets - 2 reps - 2 deep breathes hold Upright Stance at Door Frame with Both Arms - 1-3 x daily - 7 x weekly - 1 sets - 2 reps - 2 deep breathes hold Hooklying Single Knee to Chest Stretch - 1 x daily - 7 x weekly - 1 sets - 2-3 reps - 15-30 seconds hold Supine Single Knee to Chest Stretch - 1 x daily - 7 x weekly - 1 sets - 2-3 reps - 15-30 seconds hold Supine Lower Trunk Rotation - 1 x daily - 7 x weekly - 1 sets - 2-3 reps - 15-30 seconds hold Supine Posterior Pelvic Tilt - 1 x daily - 7 x weekly - 1 sets - 10 reps - 5 seconds hold Supine Bridge - 1 x daily - 7 x weekly - 1 sets - 10 reps - 5 seconds hold Seated Pelvic Tilts - 1 x daily - 7 x weekly - 1-3 sets - 10 reps - 5 seconds hold

## 2020-01-27 NOTE — Therapy (Signed)
[facility] Physical Therapy [address] [place], [place], [address] Phone: [phone]   Fax:  [phone]  Physical Therapy Treatment  Patient Details  Name: Olivia Romero MRN: [id number] Date of Birth: [birth year] Referring Provider (PT): Meridee Score, MD   Encounter Date: 01/27/2020   PT End of Session - 01/27/20 1013    Visit Number 32    Number of Visits 76    Date for PT Re-Evaluation 03/11/20    Authorization Type [employer] Medicare, $30 co-pay, no visit limit    Progress Note Due on Visit 40    PT Start Time 1010    PT Stop Time 1100    PT Time Calculation (min) 50 min    Equipment Utilized During Treatment Gait belt    Activity Tolerance Patient tolerated treatment well    Behavior During Therapy [facility] for tasks assessed/performed           Past Medical History:  Diagnosis Date  . Anemia   . Arthritis   . Asthma   . Colon polyps    adenomatous  . Diabetes mellitus without complication ([place])   . Diabetic infection of left foot ([place]) 12/2018  . Diverticulosis of colon   . Esophagitis   . GERD (gastroesophageal reflux disease)   . Hemorrhoid    internal  . Hyperlipemia   . Hypertension   . IBS (irritable bowel syndrome)    no current prob - diet controlled  . Myalgia due to statin 11/19/2017  . Neuropathy   . Neuropathy of both feet   . Neuropathy of hand   . Pneumonia    x 4  . PONV (postoperative nausea and vomiting)    on some surgeries but not all procedures  . Skin ulcer of right ankle, limited to breakdown of skin ([place]) 12/31/2016   resolved per patient 05/14/19  . Sleep apnea    has had in the past lost 50 pounds and do longer uses cpap  . Statin intolerance 04/23/2015  . Stroke [facility]) 2009   mini stroke per patient  . Stroke-like episode 2009   TIA - mini stroke per patient  . 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 ([place]) 07/13/2015  . Uncontrolled type 2 diabetes mellitus with hyperglycemia ([place]) 11/01/2017     Past Surgical History:  Procedure Laterality Date  . ABDOMINAL HYSTERECTOMY    . AMPUTATION Left 12/06/2018   Procedure: PARTIAL AMPUTATION LEFT FOOT;  Surgeon: Edrick Kins, DPM;  Location: [place];  Service: Podiatry;  Laterality: Left;  . AMPUTATION Left 05/16/2019   Procedure: LEFT BELOW KNEE AMPUTATION;  Surgeon: Newt Minion, MD;  Location: [place];  Service: Orthopedics;  Laterality: Left;  . BACK SURGERY  09/02/2015  . BONE BIOPSY Left 12/06/2018   Procedure: Bone Biopsy;  Surgeon: Edrick Kins, DPM;  Location: [place];  Service: Podiatry;  Laterality: Left;  . CERVICAL FUSION    . CESAREAN SECTION  1986  . COLONOSCOPY N/A 11/30/2012   Procedure: COLONOSCOPY;  Surgeon: Irene Shipper, MD;  Location: [facility] ENDOSCOPY;  Service: Endoscopy;  Laterality: N/A;  . COLONOSCOPY WITH PROPOFOL N/A 01/03/2016   Procedure: COLONOSCOPY WITH PROPOFOL;  Surgeon: Manya Silvas, MD;  Location: [facility] ENDOSCOPY;  Service: Endoscopy;  Laterality: N/A;  . ESOPHAGOGASTRODUODENOSCOPY (EGD) WITH PROPOFOL N/A 01/14/2015   Procedure: ESOPHAGOGASTRODUODENOSCOPY (EGD) WITH PROPOFOL;  Surgeon: Manya Silvas, MD;  Location: [facility] ENDOSCOPY;  Service: Endoscopy;  Laterality: N/A;  . IRRIGATION AND DEBRIDEMENT FOOT Left 03/13/2018  Procedure: IRRIGATION AND DEBRIDEMENT FOOT WITH BONE BIOPSY WITH MISONIX DEBRIDER;  Surgeon: Evelina Bucy, DPM;  Location: [place];  Service: Podiatry;  Laterality: Left;  . IRRIGATION AND DEBRIDEMENT FOOT Left 12/06/2018   Procedure: Irrigation And Debridement Foot;  Surgeon: Edrick Kins, DPM;  Location: [place];  Service: Podiatry;  Laterality: Left;  . LEG AMPUTATION BELOW KNEE Left   . LUMBAR WOUND DEBRIDEMENT N/A 10/01/2015   Procedure: LUMBAR WOUND DEBRIDEMENT;  Surgeon: Ashok Pall, MD;  Location: [place] NEURO ORS;  Service: Neurosurgery;  Laterality: N/A;  LUMBAR WOUND DEBRIDEMENT  . NASAL SINUS SURGERY    . SAVORY DILATION N/A 01/14/2015   Procedure: SAVORY DILATION;  Surgeon: Manya Silvas, MD;  Location: [facility] ENDOSCOPY;  Service: Endoscopy;  Laterality: N/A;  . TONSILLECTOMY    . WISDOM TOOTH EXTRACTION      There were no vitals filed for this visit.   Subjective Assessment - 01/27/20 1010    Subjective She slept wrong so woke up with her back hurting    Pertinent History L TTA, DM, CVA, sleep apnea, PNA, neuropathy bil. 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 Yes    Pain Score 6     Pain Location Back    Pain Orientation Lower    Pain Descriptors / Indicators Aching;Sore    Pain Type Chronic pain    Pain Onset In the past 7 days    Pain Frequency Intermittent    Aggravating Factors  slept wrong,    Pain Relieving Factors stretches & gentle mobility                   Access Code: [email or web address] URL: [email or web address] Date: 01/27/2020 Prepared by: Jamey Reas  Exercises Hooklying Single Knee to Chest Stretch - 1 x daily - 7 x weekly - 1 sets - 2-3 reps - 15-30 seconds hold Supine Single Knee to Chest Stretch - 1 x daily - 7 x weekly - 1 sets - 2-3 reps - 15-30 seconds hold Supine Lower Trunk Rotation - 1 x daily - 7 x weekly - 1 sets - 2-3 reps - 15-30 seconds hold Supine Posterior Pelvic Tilt - 1 x daily - 7 x weekly - 1 sets - 10 reps - 5 seconds hold Supine Bridge - 1 x daily - 7 x weekly - 1 sets - 10 reps - 5 seconds hold Seated Pelvic Tilts - 1 x daily - 7 x weekly - 1-3 sets - 10 reps - 5 seconds hold   PT demo supine to/from sit via sidelying and protecting her back. Pt return demo understanding.         [place] Adult PT Treatment/Exercise - 01/27/20 1010      Transfers   Transfers Sit to Stand;Stand to Sit    Sit to Stand 5: Supervision;With upper  extremity assist;With armrests;From chair/3-in-1;Other (comment)   to forearm crutches   Stand to Sit 5: Supervision;With upper extremity assist;With armrests;To chair/3-in-1;Other (comment)   from forearm crutches     Ambulation/Gait   Ambulation/Gait Yes    Ambulation/Gait Assistance 5: Supervision    Ambulation Distance (Feet) 100 Feet    Assistive device Lofstrands;Prosthesis    Gait Pattern Step-through pattern;Decreased arm swing -  left;Decreased step length - right;Decreased stance time - left;Antalgic;Trunk flexed    Ambulation Surface Level;Indoor    Stairs Yes    Stairs Assistance 5: Supervision    Stairs Assistance Details (indicate cue type and reason) cues on sequence and technique with single crutch and single rail     Stair Management Technique One rail Right;One rail Left;With crutches;Step to pattern;Forwards    Number of Stairs 5   3 with right rail & 2 with left rail   Height of Stairs 6                  PT Education - 01/27/20 1042    Education Details added back exercises to HEP    Person(s) Educated Patient    Methods Explanation;Demonstration;Tactile cues;Verbal cues;Handout    Comprehension Verbalized understanding;Returned demonstration;Need further instruction            PT Short Term Goals - 01/27/20 1706      PT SHORT TERM GOAL #1   Title Patient demonstrates and verbalizes understanding of updated HEP. (All STGs Target Date: 02/13/2020)    Time 4    Period Weeks    Status On-going    Target Date 02/13/20      PT SHORT TERM GOAL #2   Title Patient verbalizes proper prosthetic care.    Time 4    Period Weeks    Status Revised    Target Date 02/13/20      PT SHORT TERM GOAL #3   Title Timed Up & Go with single crutch, AFO & prosthesis <25 seconds.    Time 4    Period Weeks    Status Revised    Target Date 02/13/20      PT SHORT TERM GOAL #4   Title Patient ambulates 18' around furniture / obstacles with single forearm crutches &  prosthesis with supervision.    Time 4    Period Weeks    Status Revised    Target Date 02/13/20      PT SHORT TERM GOAL #5   Title --    Time --    Period --    Status --    Target Date --             PT Long Term Goals - 01/27/20 1709      PT LONG TERM GOAL #1   Title Patient verbalizes & demonstrates proper prosthetic care and tolerates wear >90% of awake hours without skin issues to enable safe utilization of prosthesis.  (All LTGs Target Date 03/11/2020)    Time 12    Period Weeks    Status On-going    Target Date 03/11/20      PT LONG TERM GOAL #2   Title Tasks of [place] Balance Test with single forearm crutch support >36/56 indicating improved function with standing ADLs    Time 12    Period Weeks    Status On-going    Target Date 03/11/20      PT LONG TERM GOAL #3   Title Patient ambulates 500' with rollator walker or 2 forearm crutches with 50' on grass with AFO & prosthesis modified independent to enable community mobility.    Time 12    Period Weeks    Status On-going    Target Date 03/11/20      PT LONG TERM GOAL #4   Title Dynamic Gait Index with 2 forearm crutches >/= 14/24    Time 12    Period  Weeks    Status On-going    Target Date 03/11/20      PT LONG TERM GOAL #5   Title Patient negotiates ramps & curbs with 2 forearm crutches or rollator walker & prosthesis modified independent for community access.    Time 12    Period Weeks    Status On-going    Target Date 03/11/20      PT LONG TERM GOAL #6   Title Patient ambulates 45' around furniture carrying item in free UE with single forearm crutch, prosthesis & AFO modified independent.    Time 12    Period Weeks    Status On-going    Target Date 03/11/20      PT LONG TERM GOAL #7   Title Timed Up & Go with single forearm crutch <20 seconds.    Time 12    Period Weeks    Status Revised    Target Date 03/11/20                 Plan - 01/27/20 1010    Clinical Impression Statement  PT instructed in HEP to manage low back pain and she appears to understand. She reported her low back pain decreased from 6/10 to 2/10 with exercises.    Personal Factors and Comorbidities Comorbidity 3+;Fitness;Time since onset of injury/illness/exacerbation    Comorbidities L TTA, DM, CVA, sleep apnea, PNA, neuropathy bil. feet and hand, HTN, s/p cervical fusion, s/p back surgery, IBS, asthma, Covid in Jan 2021    Examination-Activity Limitations Hygiene/Grooming;Locomotion Level;Reach Overhead;Stairs;Stand;Transfers    Examination-Participation Restrictions Recruitment consultant Evolving/Moderate complexity    Rehab Potential Good    PT Frequency 2x / week    PT Duration 12 weeks    PT Treatment/Interventions ADLs/Self Care Home Management;DME Instruction;Gait training;Stair training;Functional mobility training;Therapeutic activities;Therapeutic exercise;Balance training;Neuromuscular re-education;Patient/family education;Orthotic Fit/Training;Prosthetic Training;Vestibular;Manual techniques    PT Next Visit Plan balance & gait activities, therapuetic exercise, work towards updated STGs    PT Home Exercise Plan Access Code: [email or web address]    at sink for mini squats with UE support, weight shifts with UE support, and balance feet apart no Ue support all with chair behind her    Consulted and Agree with Plan of Care Patient           Patient will benefit from skilled therapeutic intervention in order to improve the following deficits and impairments:  Abnormal gait, Decreased activity tolerance, Decreased balance, Decreased endurance, Decreased knowledge of use of DME, Decreased mobility, Decreased range of motion, Decreased safety awareness, Decreased strength, Increased edema, Impaired flexibility, Impaired sensation, Postural dysfunction, Prosthetic Dependency, Obesity, Pain  Visit Diagnosis: Other abnormalities of gait and mobility  Unsteadiness on  feet  Abnormal posture  History of fall  Muscle weakness (generalized)  Stiffness of left knee, not elsewhere classified     Problem List Patient Active Problem List   Diagnosis Date Noted  . Allergy to statin medication 12/01/2019  . History of below knee amputation, left ([place]) 07/30/2019  . Gangrene of left foot ([place]) 05/16/2019  . Subacute osteomyelitis of left foot ([place])   . Charcot's joint of foot, left   . Post-operative state   . PAD (peripheral artery disease) ([place]) 12/05/2018  . Osteomyelitis of ankle or foot, acute, left ([place]) 12/05/2018  . MRSA bacteremia 12/05/2018  . Sepsis without acute organ dysfunction ([place])   . Charcot's joint of foot due to diabetes ([place]) 09/18/2018  . BMI 36.0-36.9,adult   .  Morbid obesity ([place]) 11/19/2017  . B12 deficiency 11/01/2017  . Diabetic foot ulcer ([place]) 12/14/2016  . Diabetic polyneuropathy associated with type 2 diabetes mellitus ([place]) 12/13/2015  . Lumbar stenosis with neurogenic claudication 09/02/2015  . Chronic constipation 07/13/2015  . OSA (obstructive sleep apnea) 06/25/2015  . 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/23/2015  . Hypertriglyceridemia 04/23/2015  . Asthma, mild intermittent 02/01/2015  . Type 2 diabetes mellitus with diabetic nephropathy ([place]) 12/31/2014  . Gastroesophageal reflux disease with esophagitis 12/31/2014  . Dysphagia 12/31/2014  . Bilateral carpal tunnel syndrome 12/01/2014  . Diverticulosis of colon (without mention of hemorrhage) 11/30/2012  . Essential hypertension, benign 11/28/2012    Jamey Reas PT, DPT 01/27/2020, 5:17 PM  [facility] Physical Therapy [address] [place], [place], [address] Phone: [phone]   Fax:  [phone]  Name: CELICA KOTOWSKI MRN: [id number] Date of Birth: Sep 20, 1955

## 2020-01-29 ENCOUNTER — Ambulatory Visit (INDEPENDENT_AMBULATORY_CARE_PROVIDER_SITE_OTHER): Payer: Medicare Other | Admitting: Physical Therapy

## 2020-01-29 ENCOUNTER — Other Ambulatory Visit: Payer: Self-pay

## 2020-01-29 DIAGNOSIS — R2689 Other abnormalities of gait and mobility: Secondary | ICD-10-CM

## 2020-01-29 DIAGNOSIS — Z9181 History of falling: Secondary | ICD-10-CM

## 2020-01-29 DIAGNOSIS — R293 Abnormal posture: Secondary | ICD-10-CM | POA: Diagnosis not present

## 2020-01-29 DIAGNOSIS — M6281 Muscle weakness (generalized): Secondary | ICD-10-CM

## 2020-01-29 DIAGNOSIS — R2681 Unsteadiness on feet: Secondary | ICD-10-CM | POA: Diagnosis not present

## 2020-01-29 DIAGNOSIS — R531 Weakness: Secondary | ICD-10-CM

## 2020-01-29 DIAGNOSIS — M25662 Stiffness of left knee, not elsewhere classified: Secondary | ICD-10-CM

## 2020-01-29 NOTE — Therapy (Signed)
[facility] [address] [place], [place], [address] Phone: [phone]   Fax:  [phone]  Physical Therapy Treatment  Patient Details  Name: [name] MRN: [id number] Date of Birth: 18-Jun-1955 Referring Provider (PT): Meridee Score, MD   Encounter Date: 01/29/2020   PT End of Session - 01/29/20 1142    Visit Number 33    Number of Visits 52    Date for PT Re-Evaluation 03/11/20    Authorization Type [employer] Medicare, $30 co-pay, no visit limit    Progress Note Due on Visit 40    PT Start Time 1100    PT Stop Time 1144    PT Time Calculation (min) 44 min    Equipment Utilized During Treatment Gait belt    Activity Tolerance Patient tolerated treatment well    Behavior During Therapy WFL for tasks assessed/performed           Past Medical History:  Diagnosis Date  . Anemia   . Arthritis   . Asthma   . Colon polyps    adenomatous  . Diabetes mellitus without complication ([place])   . Diabetic infection of left foot ([place]) 12/2018  . Diverticulosis of colon   . Esophagitis   . GERD (gastroesophageal reflux disease)   . Hemorrhoid    internal  . Hyperlipemia   . Hypertension   . IBS (irritable bowel syndrome)    no current prob - diet controlled  . Myalgia due to statin 11/19/2017  . Neuropathy   . Neuropathy of both feet   . Neuropathy of hand   . Pneumonia    x 4  . PONV (postoperative nausea and vomiting)    on some surgeries but not all procedures  . Skin ulcer of right ankle, limited to breakdown of skin ([place]) 12/31/2016   resolved per patient 05/14/19  . Sleep apnea    has had in the past lost 50 pounds and do longer uses cpap  . Statin intolerance 04/23/2015  . Stroke [facility]) 2009   mini stroke per patient  . Stroke-like episode 2009   TIA - mini stroke per patient  . 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 Uncontrolled type 2 diabetes mellitus with hyperglycemia ([place]) 11/01/2017     Past Surgical History:  Procedure Laterality Date  . ABDOMINAL HYSTERECTOMY    . AMPUTATION Left 12/06/2018   Procedure: PARTIAL AMPUTATION LEFT FOOT;  Surgeon: Edrick Kins, DPM;  Location: [place];  Service: Podiatry;  Laterality: Left;  . AMPUTATION Left 05/16/2019   Procedure: LEFT BELOW KNEE AMPUTATION;  Surgeon: Newt Minion, MD;  Location: [place];  Service: Orthopedics;  Laterality: Left;  . BACK SURGERY  09/02/2015  . BONE BIOPSY Left 12/06/2018   Procedure: Bone Biopsy;  Surgeon: Edrick Kins, DPM;  Location: [place];  Service: Podiatry;  Laterality: Left;  . CERVICAL FUSION    . CESAREAN SECTION  1986  . COLONOSCOPY N/A 11/30/2012   Procedure: COLONOSCOPY;  Surgeon: Irene Shipper, MD;  Location: [facility] ENDOSCOPY;  Service: Endoscopy;  Laterality: N/A;  . COLONOSCOPY WITH PROPOFOL N/A 01/03/2016   Procedure: COLONOSCOPY WITH PROPOFOL;  Surgeon: Manya Silvas, MD;  Location: [facility] ENDOSCOPY;  Service: Endoscopy;  Laterality: N/A;  . ESOPHAGOGASTRODUODENOSCOPY (EGD) WITH PROPOFOL N/A 01/14/2015   Procedure: ESOPHAGOGASTRODUODENOSCOPY (EGD) WITH PROPOFOL;  Surgeon: Manya Silvas, MD;  Location: [facility] ENDOSCOPY;  Service: Endoscopy;  Laterality: N/A;  . IRRIGATION AND DEBRIDEMENT FOOT Left 03/13/2018  Procedure: IRRIGATION AND DEBRIDEMENT FOOT WITH BONE BIOPSY WITH MISONIX DEBRIDER;  Surgeon: Evelina Bucy, DPM;  Location: [place];  Service: Podiatry;  Laterality: Left;  . IRRIGATION AND DEBRIDEMENT FOOT Left 12/06/2018   Procedure: Irrigation And Debridement Foot;  Surgeon: Edrick Kins, DPM;  Location: [place];  Service: Podiatry;  Laterality: Left;  . LEG AMPUTATION BELOW KNEE Left   . LUMBAR WOUND DEBRIDEMENT N/A 10/01/2015   Procedure: LUMBAR WOUND DEBRIDEMENT;  Surgeon: Ashok Pall, MD;  Location: [place] NEURO ORS;  Service: Neurosurgery;  Laterality: N/A;  LUMBAR WOUND DEBRIDEMENT  . NASAL SINUS SURGERY    . SAVORY DILATION N/A 01/14/2015   Procedure: SAVORY DILATION;  Surgeon: Manya Silvas, MD;  Location: [facility] ENDOSCOPY;  Service: Endoscopy;  Laterality: N/A;  . TONSILLECTOMY    . WISDOM TOOTH EXTRACTION      There were no vitals filed for this visit.   Subjective Assessment - 01/29/20 1120    Subjective She relays her back is better today, she still has not received her crutches for home. Denies pain, has been feeling bettter since spacer was applied in socket    Pertinent History L TTA, DM, CVA, sleep apnea, PNA, neuropathy bil. 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, in community    Pain Onset In the past 7 days                             [facility] Adult PT Treatment/Exercise - 01/29/20 0001      Transfers   Transfers Sit to Stand;Stand to Sit    Sit to Stand 5: Supervision;With upper extremity assist;With armrests;From chair/3-in-1;Other (comment)    Stand to Sit 5: Supervision;With upper extremity assist;With armrests;To chair/3-in-1;Other (comment)      Ambulation/Gait   Ambulation/Gait Yes    Ambulation/Gait Assistance 5: Supervision    Ambulation Distance (Feet) 200 Feet   with bilat loftstands, then 30 X 4 one crutch   Assistive device Lofstrands;Prosthesis    Gait Pattern Step-through pattern;Decreased arm swing - left;Decreased step length - right;Decreased stance time - left;Antalgic;Trunk flexed    Ramp 5: Supervision    Ramp Details (indicate cue type and reason) 2 reps    Curb 5: Supervision    Curb Details (indicate cue type and reason) 6.5 inch curb X 2, cues and demo for technique    Gait Comments carried bottle and plate for single crutch ambulation       Exercises   Other Exercises  Nu step L5 for 10 min LE/UE. Standing at sink retrieving cans of juice and placing back into top cabinient                     PT Short Term Goals - 01/27/20 1706      PT SHORT TERM GOAL #1   Title Patient demonstrates and verbalizes understanding of updated HEP. (All STGs Target Date: 02/13/2020)    Time 4    Period Weeks    Status On-going    Target Date 02/13/20      PT SHORT TERM GOAL #2   Title Patient verbalizes proper prosthetic care.    Time 4    Period Weeks    Status Revised  Target Date 02/13/20      PT SHORT TERM GOAL #3   Title Timed Up & Go with single crutch, AFO & prosthesis <25 seconds.    Time 4    Period Weeks    Status Revised    Target Date 02/13/20      PT SHORT TERM GOAL #4   Title Patient ambulates 72' around furniture / obstacles with single forearm crutches & prosthesis with supervision.    Time 4    Period Weeks    Status Revised    Target Date 02/13/20      PT SHORT TERM GOAL #5   Title --    Time --    Period --    Status --    Target Date --             PT Long Term Goals - 01/27/20 1709      PT LONG TERM GOAL #1   Title Patient verbalizes & demonstrates proper prosthetic care and tolerates wear >90% of awake hours without skin issues to enable safe utilization of prosthesis.  (All LTGs Target Date 03/11/2020)    Time 12    Period Weeks    Status On-going    Target Date 03/11/20      PT LONG TERM GOAL #2   Title Tasks of [place] Balance Test with single forearm crutch support >36/56 indicating improved function with standing ADLs    Time 12    Period Weeks    Status On-going    Target Date 03/11/20      PT LONG TERM GOAL #3   Title Patient ambulates 500' with rollator walker or 2 forearm crutches with 50' on grass with AFO & prosthesis modified independent to enable community mobility.    Time 12    Period Weeks    Status On-going    Target Date 03/11/20      PT LONG TERM GOAL #4   Title Dynamic Gait Index with 2 forearm crutches >/= 14/24    Time 12    Period Weeks    Status On-going    Target Date 03/11/20      PT LONG  TERM GOAL #5   Title Patient negotiates ramps & curbs with 2 forearm crutches or rollator walker & prosthesis modified independent for community access.    Time 12    Period Weeks    Status On-going    Target Date 03/11/20      PT LONG TERM GOAL #6   Title Patient ambulates 93' around furniture carrying item in free UE with single forearm crutch, prosthesis & AFO modified independent.    Time 12    Period Weeks    Status On-going    Target Date 03/11/20      PT LONG TERM GOAL #7   Title Timed Up & Go with single forearm crutch <20 seconds.    Time 12    Period Weeks    Status Revised    Target Date 03/11/20                 Plan - 01/29/20 1143    Clinical Impression Statement Her back pain was better today so focused more on gait, stairs, curb with forearm crutches as well as progressing to single crutch while carring items. She is showing good progress with ambulation and balance but still with deficits in these areas. PT will continue to progress as able.    Personal Factors and Comorbidities  Comorbidity 3+;Fitness;Time since onset of injury/illness/exacerbation    Comorbidities L TTA, DM, CVA, sleep apnea, PNA, neuropathy bil. feet and hand, HTN, s/p cervical fusion, s/p back surgery, IBS, asthma, Covid in Jan 2021    Examination-Activity Limitations Hygiene/Grooming;Locomotion Level;Reach Overhead;Stairs;Stand;Transfers    Examination-Participation Restrictions Recruitment consultant Evolving/Moderate complexity    Rehab Potential Good    PT Frequency 2x / week    PT Duration 12 weeks    PT Treatment/Interventions ADLs/Self Care Home Management;DME Instruction;Gait training;Stair training;Functional mobility training;Therapeutic activities;Therapeutic exercise;Balance training;Neuromuscular re-education;Patient/family education;Orthotic Fit/Training;Prosthetic Training;Vestibular;Manual techniques    PT Next Visit Plan balance &  gait activities, therapuetic exercise, work towards updated STGs    PT Home Exercise Plan Access Code: [email or web address]    at sink for mini squats with UE support, weight shifts with UE support, and balance feet apart no Ue support all with chair behind her    Consulted and Agree with Plan of Care Patient           Patient will benefit from skilled therapeutic intervention in order to improve the following deficits and impairments:  Abnormal gait, Decreased activity tolerance, Decreased balance, Decreased endurance, Decreased knowledge of use of DME, Decreased mobility, Decreased range of motion, Decreased safety awareness, Decreased strength, Increased edema, Impaired flexibility, Impaired sensation, Postural dysfunction, Prosthetic Dependency, Obesity, Pain  Visit Diagnosis: Other abnormalities of gait and mobility  Unsteadiness on feet  Abnormal posture  History of fall  Muscle weakness (generalized)  Stiffness of left knee, not elsewhere classified  Weakness generalized     Problem List Patient Active Problem List   Diagnosis Date Noted  . Allergy to statin medication 12/01/2019  . History of below knee amputation, left ([place]) 07/30/2019  . Gangrene of left foot ([place]) 05/16/2019  . Subacute osteomyelitis of left foot ([place])   . Charcot's joint of foot, left   . Post-operative state   . PAD (peripheral artery disease) ([place]) 12/05/2018  . Osteomyelitis of ankle or foot, acute, left ([place]) 12/05/2018  . MRSA bacteremia 12/05/2018  . Sepsis without acute organ dysfunction ([place])   . Charcot's joint of foot due to diabetes ([place]) 09/18/2018  . BMI 36.0-36.9,adult   . Morbid obesity ([place]) 11/19/2017  . B12 deficiency 11/01/2017  . Diabetic foot ulcer ([place]) 12/14/2016  . Diabetic polyneuropathy associated with type 2 diabetes mellitus ([place]) 12/13/2015  . Lumbar stenosis with neurogenic claudication 09/02/2015  . Chronic constipation 07/13/2015  . OSA (obstructive sleep apnea)  06/25/2015  . 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/23/2015  . Hypertriglyceridemia 04/23/2015  . Asthma, mild intermittent 02/01/2015  . Type 2 diabetes mellitus with diabetic nephropathy ([place]) 12/31/2014  . Gastroesophageal reflux disease with esophagitis 12/31/2014  . Dysphagia 12/31/2014  . Bilateral carpal tunnel syndrome 12/01/2014  . Diverticulosis of colon (without mention of hemorrhage) 11/30/2012  . Essential hypertension, benign 11/28/2012    Silvestre Mesi 01/29/2020, 11:58 AM  [facility] [address] [place], [place], [address] Phone: [phone]   Fax:  [phone]  Name: Olivia Romero MRN: [id number] Date of Birth: 1955/10/22

## 2020-02-02 NOTE — Patient Instructions (Signed)
COVID-19 Vaccine Information can be found at: [email or web address] For questions related to vaccine distribution or appointments, please email [email or web address] or call [phone].   Contraindications and precautions  While rare, anaphylactic reactions have been reported following vaccination with mRNA COVID-19 vaccines. Although investigations are ongoing, persons with a history of an immediate allergic reaction (of any severity) to an mRNA COVID-19 vaccine or any of its components might be at greater risk for anaphylaxis upon re-exposure to either of the currently authorized mRNA COVID-19 vaccines. For the purposes of this guidance, an immediate allergic reaction to a vaccine or medication is defined as any hypersensitivity-related signs or symptoms such as urticaria, angioedema, respiratory distress (e.g., wheezing, stridor), or anaphylaxis that occur within four hours following administration.  Recommendations for contraindications and precautions are described below and summarized in Appendix A. The following recommendations may change as further information becomes available.  Contraindications  CDC considers a history of the following to be a contraindication to vaccination with both the Pfizer-BioNTech and Moderna COVID-19 vaccines:  Severe allergic reaction (e.g., anaphylaxis) after a previous dose of an mRNA COVID-19 vaccine or any of its components Immediate allergic reaction of any severity to a previous dose of an mRNA COVID-19 vaccine or any of its components (including polyethylene glycol [PEG])* Immediate allergic reaction of any severity to polysorbate (due to potential cross-reactive hypersensitivity with the vaccine ingredient PEG)* * These persons should not receive mRNA COVID-19 vaccination at this time unless they have been evaluated by an allergist-immunologist and it is determined that the person can safely receive  the vaccine (e.g., under observation, in a setting with advanced medical care available). See Appendix B for more information on ingredients included in mRNA COVID-19 vaccines.  Persons with an immediate allergic reaction to the first dose of an mRNA COVID-19 vaccine should not receive additional doses of either of the mRNA COVID-19 vaccines. Providers should attempt to determine whether reactions reported following vaccination are consistent with immediate allergic reactions versus other types of reactions commonly observed following vaccination, such as a vasovagal reaction or post-vaccination side effects (which are not contraindications to receiving the second vaccine dose) (Appendix C).

## 2020-02-03 ENCOUNTER — Ambulatory Visit (INDEPENDENT_AMBULATORY_CARE_PROVIDER_SITE_OTHER): Payer: Medicare Other | Admitting: Physical Therapy

## 2020-02-03 ENCOUNTER — Encounter: Payer: Self-pay | Admitting: Physical Therapy

## 2020-02-03 ENCOUNTER — Other Ambulatory Visit: Payer: Self-pay

## 2020-02-03 DIAGNOSIS — R2681 Unsteadiness on feet: Secondary | ICD-10-CM | POA: Diagnosis not present

## 2020-02-03 DIAGNOSIS — M6281 Muscle weakness (generalized): Secondary | ICD-10-CM

## 2020-02-03 DIAGNOSIS — M25662 Stiffness of left knee, not elsewhere classified: Secondary | ICD-10-CM

## 2020-02-03 DIAGNOSIS — R293 Abnormal posture: Secondary | ICD-10-CM

## 2020-02-03 DIAGNOSIS — Z9181 History of falling: Secondary | ICD-10-CM

## 2020-02-03 DIAGNOSIS — R2689 Other abnormalities of gait and mobility: Secondary | ICD-10-CM

## 2020-02-03 NOTE — Therapy (Signed)
[facility] [address] [place], [place], [address] Phone: [phone]   Fax:  [phone]  Physical Therapy Treatment  Patient Details  Name: Olivia Romero MRN: [id number] Date of Birth: [birth year] Referring Provider (PT): Meridee Score, MD   Encounter Date: 02/03/2020   PT End of Session - 02/03/20 0940    Visit Number 34    Number of Visits 46    Date for PT Re-Evaluation 03/11/20    Authorization Type [employer] Medicare, $30 co-pay, no visit limit    Progress Note Due on Visit 40    PT Start Time 0945    PT Stop Time 1045    PT Time Calculation (min) 60 min    Equipment Utilized During Treatment Gait belt    Activity Tolerance Patient tolerated treatment well    Behavior During Therapy [facility] for tasks assessed/performed           Past Medical History:  Diagnosis Date  . Anemia   . Arthritis   . Asthma   . Colon polyps    adenomatous  . Diabetes mellitus without complication ([place])   . Diabetic infection of left foot ([place]) 12/2018  . Diverticulosis of colon   . Esophagitis   . GERD (gastroesophageal reflux disease)   . Hemorrhoid    internal  . Hyperlipemia   . Hypertension   . IBS (irritable bowel syndrome)    no current prob - diet controlled  . Myalgia due to statin 11/19/2017  . Neuropathy   . Neuropathy of both feet   . Neuropathy of hand   . Pneumonia    x 4  . PONV (postoperative nausea and vomiting)    on some surgeries but not all procedures  . Skin ulcer of right ankle, limited to breakdown of skin ([place]) 12/31/2016   resolved per patient 05/14/19  . Sleep apnea    has had in the past lost 50 pounds and do longer uses cpap  . Statin intolerance 04/23/2015  . Stroke [facility]) 2009   mini stroke per patient  . Stroke-like episode 2009   TIA - mini stroke per patient  . 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 Uncontrolled type 2 diabetes mellitus with hyperglycemia ([place]) 11/01/2017     Past Surgical History:  Procedure Laterality Date  . ABDOMINAL HYSTERECTOMY    . AMPUTATION Left 12/06/2018   Procedure: PARTIAL AMPUTATION LEFT FOOT;  Surgeon: Edrick Kins, DPM;  Location: [place];  Service: Podiatry;  Laterality: Left;  . AMPUTATION Left 05/16/2019   Procedure: LEFT BELOW KNEE AMPUTATION;  Surgeon: Newt Minion, MD;  Location: [place];  Service: Orthopedics;  Laterality: Left;  . BACK SURGERY  09/02/2015  . BONE BIOPSY Left 12/06/2018   Procedure: Bone Biopsy;  Surgeon: Edrick Kins, DPM;  Location: [place];  Service: Podiatry;  Laterality: Left;  . CERVICAL FUSION    . CESAREAN SECTION  1986  . COLONOSCOPY N/A 11/30/2012   Procedure: COLONOSCOPY;  Surgeon: Irene Shipper, MD;  Location: [facility] ENDOSCOPY;  Service: Endoscopy;  Laterality: N/A;  . COLONOSCOPY WITH PROPOFOL N/A 01/03/2016   Procedure: COLONOSCOPY WITH PROPOFOL;  Surgeon: Manya Silvas, MD;  Location: [facility] ENDOSCOPY;  Service: Endoscopy;  Laterality: N/A;  . ESOPHAGOGASTRODUODENOSCOPY (EGD) WITH PROPOFOL N/A 01/14/2015   Procedure: ESOPHAGOGASTRODUODENOSCOPY (EGD) WITH PROPOFOL;  Surgeon: Manya Silvas, MD;  Location: [facility] ENDOSCOPY;  Service: Endoscopy;  Laterality: N/A;  . IRRIGATION AND DEBRIDEMENT FOOT Left 03/13/2018  Procedure: IRRIGATION AND DEBRIDEMENT FOOT WITH BONE BIOPSY WITH MISONIX DEBRIDER;  Surgeon: Evelina Bucy, DPM;  Location: [place];  Service: Podiatry;  Laterality: Left;  . IRRIGATION AND DEBRIDEMENT FOOT Left 12/06/2018   Procedure: Irrigation And Debridement Foot;  Surgeon: Edrick Kins, DPM;  Location: [place];  Service: Podiatry;  Laterality: Left;  . LEG AMPUTATION BELOW KNEE Left   . LUMBAR WOUND DEBRIDEMENT N/A 10/01/2015   Procedure: LUMBAR WOUND DEBRIDEMENT;  Surgeon: Ashok Pall, MD;  Location: [place] NEURO ORS;  Service: Neurosurgery;  Laterality: N/A;  LUMBAR WOUND DEBRIDEMENT  . NASAL SINUS SURGERY    . SAVORY DILATION N/A 01/14/2015   Procedure: SAVORY DILATION;  Surgeon: Manya Silvas, MD;  Location: [facility] ENDOSCOPY;  Service: Endoscopy;  Laterality: N/A;  . TONSILLECTOMY    . WISDOM TOOTH EXTRACTION      There were no vitals filed for this visit.   Subjective Assessment - 02/03/20 0940    Subjective She got her forearm crutches Friday and walked on grass with son's supervision.  She was aware of liner slippage this morning and need to dry limb/liner.    Pertinent History L TTA, DM, CVA, sleep apnea, PNA, neuropathy bil. 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 No/denies    Pain Onset In the past 7 days                             [facility] Adult PT Treatment/Exercise - 02/03/20 0945      Transfers   Transfers Sit to Stand;Stand to Sit    Sit to Stand 5: Supervision;With upper extremity assist;With armrests;From chair/3-in-1;Other (comment)    Stand to Sit 5: Supervision;With upper extremity assist;With armrests;To chair/3-in-1;Other (comment)      Ambulation/Gait   Ambulation/Gait Yes    Ambulation/Gait Assistance 5: Supervision;4: Min guard   min guard with single crutch, supervision 2 crutches   Ambulation/Gait Assistance Details using ASIS (headlights) to orient pelvis in turns.  Worked on carrying item in free hand with single crutch gait negotiating furniture.  PT adjusted patient's crutches.           Ambulation Distance (Feet) 200 Feet   200' w/2 crutches, 50' X 3 with single crutch   Assistive device Lofstrands;Prosthesis;R Forearm Crutch    Gait Pattern Step-through pattern;Decreased arm swing - left;Decreased step length - right;Decreased stance time - left;Antalgic;Trunk flexed    Ambulation Surface Level;Indoor    Ramp --    Curb --    Gait Comments --      Knee/Hip  Exercises: Aerobic   Nustep L6 X 7 min BUEs & BLEs       Knee/Hip Exercises: Machines for Strengthening   Cybex Leg Press Shuttle leg press BLEs 87# 15 reps 2 sets                  PT Education - 02/03/20 1045    Education Details ongoing fitness addressing endurance, strength, flexibility & balance;  added standing kicks & quarter turns to HEP    Person(s) Educated Patient    Methods Explanation;Demonstration;Tactile cues;Verbal cues;Handout    Comprehension Verbalized understanding;Returned demonstration;Verbal cues required;Tactile cues required;Need further instruction  PT Short Term Goals - 01/27/20 1706      PT SHORT TERM GOAL #1   Title Patient demonstrates and verbalizes understanding of updated HEP. (All STGs Target Date: 02/13/2020)    Time 4    Period Weeks    Status On-going    Target Date 02/13/20      PT SHORT TERM GOAL #2   Title Patient verbalizes proper prosthetic care.    Time 4    Period Weeks    Status Revised    Target Date 02/13/20      PT SHORT TERM GOAL #3   Title Timed Up & Go with single crutch, AFO & prosthesis <25 seconds.    Time 4    Period Weeks    Status Revised    Target Date 02/13/20      PT SHORT TERM GOAL #4   Title Patient ambulates 104' around furniture / obstacles with single forearm crutches & prosthesis with supervision.    Time 4    Period Weeks    Status Revised    Target Date 02/13/20      PT SHORT TERM GOAL #5   Title --    Time --    Period --    Status --    Target Date --             PT Long Term Goals - 01/27/20 1709      PT LONG TERM GOAL #1   Title Patient verbalizes & demonstrates proper prosthetic care and tolerates wear >90% of awake hours without skin issues to enable safe utilization of prosthesis.  (All LTGs Target Date 03/11/2020)    Time 12    Period Weeks    Status On-going    Target Date 03/11/20      PT LONG TERM GOAL #2   Title Tasks of [place] Balance Test with single  forearm crutch support >36/56 indicating improved function with standing ADLs    Time 12    Period Weeks    Status On-going    Target Date 03/11/20      PT LONG TERM GOAL #3   Title Patient ambulates 500' with rollator walker or 2 forearm crutches with 50' on grass with AFO & prosthesis modified independent to enable community mobility.    Time 12    Period Weeks    Status On-going    Target Date 03/11/20      PT LONG TERM GOAL #4   Title Dynamic Gait Index with 2 forearm crutches >/= 14/24    Time 12    Period Weeks    Status On-going    Target Date 03/11/20      PT LONG TERM GOAL #5   Title Patient negotiates ramps & curbs with 2 forearm crutches or rollator walker & prosthesis modified independent for community access.    Time 12    Period Weeks    Status On-going    Target Date 03/11/20      PT LONG TERM GOAL #6   Title Patient ambulates 65' around furniture carrying item in free UE with single forearm crutch, prosthesis & AFO modified independent.    Time 12    Period Weeks    Status On-going    Target Date 03/11/20      PT LONG TERM GOAL #7   Title Timed Up & Go with single forearm crutch <20 seconds.    Time 12    Period Weeks  Status Revised    Target Date 03/11/20                 Plan - 02/03/20 0940    Clinical Impression Statement PT session instructed in components of ongoing fitness plan and added standing exercises that work on strength & balance.  PT also continued to work on prosthetic gait with single crutch carrying item in free hand. PT instructed in using ASIS to orient pelvis in turns.    Personal Factors and Comorbidities Comorbidity 3+;Fitness;Time since onset of injury/illness/exacerbation    Comorbidities L TTA, DM, CVA, sleep apnea, PNA, neuropathy bil. feet and hand, HTN, s/p cervical fusion, s/p back surgery, IBS, asthma, Covid in Jan 2021    Examination-Activity Limitations Hygiene/Grooming;Locomotion Level;Reach  Overhead;Stairs;Stand;Transfers    Examination-Participation Restrictions Recruitment consultant Evolving/Moderate complexity    Rehab Potential Good    PT Frequency 2x / week    PT Duration 12 weeks    PT Treatment/Interventions ADLs/Self Care Home Management;DME Instruction;Gait training;Stair training;Functional mobility training;Therapeutic activities;Therapeutic exercise;Balance training;Neuromuscular re-education;Patient/family education;Orthotic Fit/Training;Prosthetic Training;Vestibular;Manual techniques    PT Next Visit Plan balance & gait activities, therapuetic exercise, work towards updated STGs    PT Home Exercise Plan Access Code: [email or web address]    at sink for mini squats with UE support, weight shifts with UE support, and balance feet apart no Ue support all with chair behind her    Consulted and Agree with Plan of Care Patient           Patient will benefit from skilled therapeutic intervention in order to improve the following deficits and impairments:  Abnormal gait, Decreased activity tolerance, Decreased balance, Decreased endurance, Decreased knowledge of use of DME, Decreased mobility, Decreased range of motion, Decreased safety awareness, Decreased strength, Increased edema, Impaired flexibility, Impaired sensation, Postural dysfunction, Prosthetic Dependency, Obesity, Pain  Visit Diagnosis: Unsteadiness on feet  Other abnormalities of gait and mobility  Abnormal posture  History of fall  Muscle weakness (generalized)  Stiffness of left knee, not elsewhere classified     Problem List Patient Active Problem List   Diagnosis Date Noted  . Allergy to statin medication 12/01/2019  . History of below knee amputation, left ([place]) 07/30/2019  . Gangrene of left foot ([place]) 05/16/2019  . Subacute osteomyelitis of left foot ([place])   . Charcot's joint of foot, left   . Post-operative state   . PAD (peripheral artery disease)  ([place]) 12/05/2018  . Osteomyelitis of ankle or foot, acute, left ([place]) 12/05/2018  . MRSA bacteremia 12/05/2018  . Sepsis without acute organ dysfunction ([place])   . Charcot's joint of foot due to diabetes ([place]) 09/18/2018  . BMI 36.0-36.9,adult   . Morbid obesity ([place]) 11/19/2017  . B12 deficiency 11/01/2017  . Diabetic foot ulcer ([place]) 12/14/2016  . Diabetic polyneuropathy associated with type 2 diabetes mellitus ([place]) 12/13/2015  . Lumbar stenosis with neurogenic claudication 09/02/2015  . Chronic constipation 07/13/2015  . OSA (obstructive sleep apnea) 06/25/2015  . 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/23/2015  . Hypertriglyceridemia 04/23/2015  . Asthma, mild intermittent 02/01/2015  . Type 2 diabetes mellitus with diabetic nephropathy ([place]) 12/31/2014  . Gastroesophageal reflux disease with esophagitis 12/31/2014  . Dysphagia 12/31/2014  . Bilateral carpal tunnel syndrome 12/01/2014  . Diverticulosis of colon (without mention of hemorrhage) 11/30/2012  . Essential hypertension, benign 11/28/2012    Jamey Reas PT, DPT 02/03/2020, 12:59 PM  [facility] [address] [place], [place], [address]  Phone: [phone]   Fax:  [phone]  Name: Olivia Romero MRN: [id number] Date of Birth: 1956/03/19

## 2020-02-03 NOTE — Patient Instructions (Addendum)
Fitness Plan has 4 components.  1. Endurance - Goal is 20-30 minutes. You can break time up between machines. You can do sets with rest between sets if need. Example 5 minutes work, 2 minutes rest for 3 sets. Recommend machines that are sitting with back support that uses both your arms and your legs at the same time. Or can do walking program & can use assistive device if increases time & quality of your walking.  Treadmill safety- step on & off machine with foot on solid portion not treadmill belt. Use safety lead so belt will stop if you get in trouble. Straddle belt. Turn treadmill on & set to desired speed while you are still straddling the belt. Step on & off belt leading with your good leg. Step off the belt first to adjust speed or stop.  2. Strength - Goal is form & control. Weight machines should have enough weight to have resistance but not so much that you have to strain or cheat. If you have to cheat (poor form), strain or the weight stack hits hard / out of control, then you probably have too much weight.   Goal is to do 15 repetitions for 1-3 sets. Start with 1 set and build up. Rest 30-60 seconds between sets.  Do 2-4 leg machines, 2-4 arm machines & 2-4 trunk machines. Build up to 4-6 leg machines & 4-6 arm machines. Look for pictures to make sure you exercise both sides of arm, leg or trunk. You want to balance out muscle groups that you working.   Leg machines like leg press (can do both legs or each leg by themselves),   At home can do theraband exercises for arms or legs, floor transfers, sit to stand to sit using arms as little as possible, Yoga positions 3.Flexibility - make sure you include arms, legs & trunk. Can do Yoga also 4. Balance- can work in corner with chair in front - head turns, arm motions, eyes closed; place one foot inside cabinet or on 4-6" block to work on one-legged stance,   Try to get to each component 3-5 times per week.  Going to [facility] or fitness center you  want do things you can not do at home.  For example use bikes at [facility] if you don't have one at home. Then on days you don't go to [facility], do exercises at home. Having 2 or more programs like one program for [facility] & one program for home / days that you do not go to [facility].   Recommendations are at least 150 minutes a week of moderate intensity exercise. Try not to go more than 2 days in a row without being active for at least 30 minutes a day. Any activity where you are up and moving is good- Walking, bicycling, stationary bicycling, dancing.  (moderate intensity means to get  a little out of breath). To start you may not tolerate moderate but if it is challenging to you then it is helping.    Do resistance exercise at least 2 times a week.  This can be yoga poses or strength training where you lift your own weight (think leg lifts or toe raises)  or light weights like cans of beans with your arms.    Flexibility and balance exercise- safe stretching and practicing balance is very important to health.    Access Code: [email or web address] URL: [email or web address] Date: 02/03/2020 Prepared by: Jamey Reas  Exercises Seated Hip External Rotation Stretch - 1-3 x  daily - 3-7 x weekly - 1 sets - 2-3 reps - 10-20 seconds hold Seated Knee Extension Stretch with Chair - 1-3 x daily - 3-7 x weekly - 1 sets - 2-3 reps - 10-20 seconds hold Seated Flexion Stretch - 1-3 x daily - 3-7 x weekly - 1 sets - 2-3 reps - 10-20 seconds hold Upright Stance at Door Frame Single Arm - 1-3 x daily - 3-7 x weekly - 1 sets - 2 reps - 2 deep breathes hold Upright Stance at Door Frame with Both Arms - 1-3 x daily - 3-7 x weekly - 1 sets - 2 reps - 2 deep breathes hold Hooklying Single Knee to Chest Stretch - 1 x daily - 3-7 x weekly - 1 sets - 2-3 reps - 15-30 seconds hold Supine Single Knee to Chest Stretch - 1 x daily - 3-7 x weekly - 1 sets - 2-3 reps - 15-30 seconds hold Supine Lower Trunk Rotation - 1 x daily -  3-7 x weekly - 1 sets - 2-3 reps - 15-30 seconds hold Supine Posterior Pelvic Tilt - 1 x daily - 3-7 x weekly - 1 sets - 10 reps - 5 seconds hold Supine Bridge - 1 x daily - 3-7 x weekly - 1 sets - 10 reps - 5 seconds hold Seated Pelvic Tilts - 1 x daily - 3-7 x weekly - 1-3 sets - 10 reps - 5 seconds hold Standing Hip Abduction with Counter Support - 1 x daily - 3-5 x weekly - 1 sets - 5-10 reps - 2-5 seconds hold Standing Hip Extension with Counter Support - 1 x daily - 3-5 x weekly - 1 sets - 5-10 reps - 2-5 seconds hold Standing Hip Flexion with Counter Support - 1 x daily - 3-5 x weekly - 1 sets - 5-10 reps - 2-5 seconds hold Standing Quarter Turn with Counter Support - 1 x daily - 3-5 x weekly - 1 sets - 5-10 reps - 2-5 seconds hold

## 2020-02-05 ENCOUNTER — Other Ambulatory Visit: Payer: Self-pay

## 2020-02-05 ENCOUNTER — Ambulatory Visit (INDEPENDENT_AMBULATORY_CARE_PROVIDER_SITE_OTHER): Payer: Medicare Other | Admitting: Physical Therapy

## 2020-02-05 DIAGNOSIS — Z9181 History of falling: Secondary | ICD-10-CM | POA: Diagnosis not present

## 2020-02-05 DIAGNOSIS — R293 Abnormal posture: Secondary | ICD-10-CM

## 2020-02-05 DIAGNOSIS — R2681 Unsteadiness on feet: Secondary | ICD-10-CM | POA: Diagnosis not present

## 2020-02-05 DIAGNOSIS — M25662 Stiffness of left knee, not elsewhere classified: Secondary | ICD-10-CM

## 2020-02-05 DIAGNOSIS — M6281 Muscle weakness (generalized): Secondary | ICD-10-CM

## 2020-02-05 DIAGNOSIS — R2689 Other abnormalities of gait and mobility: Secondary | ICD-10-CM | POA: Diagnosis not present

## 2020-02-05 DIAGNOSIS — R531 Weakness: Secondary | ICD-10-CM

## 2020-02-05 NOTE — Therapy (Signed)
[facility] [address] [place], [place], [address] Phone: [phone]   Fax:  [phone]  Physical Therapy Treatment  Patient Details  Name: Olivia Romero MRN: [id number] Date of Birth: 1955-07-20 Referring Provider (PT): Meridee Score, MD   Encounter Date: 02/05/2020   PT End of Session - 02/05/20 1151    Visit Number 35    Number of Visits 84    Date for PT Re-Evaluation 03/11/20    Authorization Type [employer] Medicare, $30 co-pay, no visit limit    Progress Note Due on Visit 40    PT Start Time 1100    PT Stop Time 1146    PT Time Calculation (min) 46 min    Equipment Utilized During Treatment Gait belt    Activity Tolerance Patient tolerated treatment well    Behavior During Therapy [facility] for tasks assessed/performed           Past Medical History:  Diagnosis Date  . Anemia   . Arthritis   . Asthma   . Colon polyps    adenomatous  . Diabetes mellitus without complication ([place])   . Diabetic infection of left foot ([place]) 12/2018  . Diverticulosis of colon   . Esophagitis   . GERD (gastroesophageal reflux disease)   . Hemorrhoid    internal  . Hyperlipemia   . Hypertension   . IBS (irritable bowel syndrome)    no current prob - diet controlled  . Myalgia due to statin 11/19/2017  . Neuropathy   . Neuropathy of both feet   . Neuropathy of hand   . Pneumonia    x 4  . PONV (postoperative nausea and vomiting)    on some surgeries but not all procedures  . Skin ulcer of right ankle, limited to breakdown of skin ([place]) 12/31/2016   resolved per patient 05/14/19  . Sleep apnea    has had in the past lost 50 pounds and do longer uses cpap  . Statin intolerance 04/23/2015  . Stroke [facility]) 2009   mini stroke per patient  . Stroke-like episode 2009   TIA - mini stroke per patient  . 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 ([place]) 07/13/2015  . Uncontrolled type 2 diabetes mellitus with hyperglycemia ([place]) 11/01/2017     Past Surgical History:  Procedure Laterality Date  . ABDOMINAL HYSTERECTOMY    . AMPUTATION Left 12/06/2018   Procedure: PARTIAL AMPUTATION LEFT FOOT;  Surgeon: Edrick Kins, DPM;  Location: [place];  Service: Podiatry;  Laterality: Left;  . AMPUTATION Left 05/16/2019   Procedure: LEFT BELOW KNEE AMPUTATION;  Surgeon: Newt Minion, MD;  Location: [place];  Service: Orthopedics;  Laterality: Left;  . BACK SURGERY  09/02/2015  . BONE BIOPSY Left 12/06/2018   Procedure: Bone Biopsy;  Surgeon: Edrick Kins, DPM;  Location: [place];  Service: Podiatry;  Laterality: Left;  . CERVICAL FUSION    . CESAREAN SECTION  1986  . COLONOSCOPY N/A 11/30/2012   Procedure: COLONOSCOPY;  Surgeon: Irene Shipper, MD;  Location: [facility];  Service: Endoscopy;  Laterality: N/A;  . COLONOSCOPY WITH PROPOFOL N/A 01/03/2016   Procedure: COLONOSCOPY WITH PROPOFOL;  Surgeon: Manya Silvas, MD;  Location: [facility] ENDOSCOPY;  Service: Endoscopy;  Laterality: N/A;  . ESOPHAGOGASTRODUODENOSCOPY (EGD) WITH PROPOFOL N/A 01/14/2015   Procedure: ESOPHAGOGASTRODUODENOSCOPY (EGD) WITH PROPOFOL;  Surgeon: Manya Silvas, MD;  Location: [facility] ENDOSCOPY;  Service: Endoscopy;  Laterality: N/A;  . IRRIGATION AND DEBRIDEMENT FOOT Left 03/13/2018  Procedure: IRRIGATION AND DEBRIDEMENT FOOT WITH BONE BIOPSY WITH MISONIX DEBRIDER;  Surgeon: Evelina Bucy, DPM;  Location: [place];  Service: Podiatry;  Laterality: Left;  . IRRIGATION AND DEBRIDEMENT FOOT Left 12/06/2018   Procedure: Irrigation And Debridement Foot;  Surgeon: Edrick Kins, DPM;  Location: [place];  Service: Podiatry;  Laterality: Left;  . LEG AMPUTATION BELOW KNEE Left   . LUMBAR WOUND DEBRIDEMENT N/A 10/01/2015   Procedure: LUMBAR WOUND DEBRIDEMENT;  Surgeon: Ashok Pall, MD;  Location: [place] [facility]URO [facility];  Service: Neurosurgery;  Laterality: N/A;  LUMBAR WOUND DEBRIDEMENT  . NASAL SINUS SURGERY    . SAVORY DILATION N/A 01/14/2015   Procedure: SAVORY DILATION;  Surgeon: Manya Silvas, MD;  Location: [facility] ENDOSCOPY;  Service: Endoscopy;  Laterality: N/A;  . TONSILLECTOMY    . WISDOM TOOTH EXTRACTION      There were no vitals filed for this visit.   Subjective Assessment - 02/05/20 1134    Subjective relays no pain or any complaints with prosthesis, she says it has been feeling really good    Pertinent History L TTA, DM, CVA, sleep apnea, PNA, neuropathy bil. 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, in community    Pain Onset In the past 7 days            [facility] Adult PT Treatment/Exercise - 02/05/20 0001      Transfers   Transfers Sit to Stand;Stand to Sit    Sit to Stand 5: Supervision;With upper extremity assist;With armrests;From chair/3-in-1;Other (comment)    Stand to Sit 5: Supervision;With upper extremity assist;With armrests;To chair/3-in-1;Other (comment)      Ambulation/Gait   Ambulation/Gait Yes    Ambulation/Gait Assistance 5: Supervision;4: Min guard    Ambulation Distance (Feet) 200 Feet   bilat FA crutches, then 40 ft X 3 single with turns   Assistive device Lofstrands;Prosthesis;R Forearm Crutch    Gait Pattern Step-through pattern;Decreased arm swing - left;Decreased step length - right;Decreased stance time - left;Antalgic;Trunk flexed    Gait Comments carried bottle and plate for single crutch ambulation       Exercises   Other Exercises  recumbant bike for endurance and knee ROM 15 min total with work/rest intervals, performed about 10 minutes of work       Knee/Hip Exercises: Field seismologist for Strengthening   [employer] Shuttle leg press BLEs 112# [id number]                    PT Short Term Goals - 01/27/20 1706      PT SHORT TERM GOAL #1   Title Patient demonstrates and verbalizes understanding of  updated HEP. (All STGs Target Date: 02/13/2020)    Time 4    Period Weeks    Status On-going    Target Date 02/13/20      PT SHORT TERM GOAL #2   Title Patient verbalizes proper prosthetic care.    Time 4    Period Weeks    Status Revised    Target Date 02/13/20      PT SHORT TERM GOAL #3   Title Timed Up & Go with single crutch, AFO & prosthesis <25 seconds.    Time 4    Period Weeks    Status Revised  Target Date 02/13/20      PT SHORT TERM GOAL #4   Title Patient ambulates 68' around furniture / obstacles with single forearm crutches & prosthesis with supervision.    Time 4    Period Weeks    Status Revised    Target Date 02/13/20      PT SHORT TERM GOAL #5   Title --    Time --    Period --    Status --    Target Date --             PT Long Term Goals - 01/27/20 1709      PT LONG TERM GOAL #1   Title Patient verbalizes & demonstrates proper prosthetic care and tolerates wear >90% of awake hours without skin issues to enable safe utilization of prosthesis.  (All LTGs Target Date 03/11/2020)    Time 12    Period Weeks    Status On-going    Target Date 03/11/20      PT LONG TERM GOAL #2   Title Tasks of [place] Balance Test with single forearm crutch support >36/56 indicating improved function with standing ADLs    Time 12    Period Weeks    Status On-going    Target Date 03/11/20      PT LONG TERM GOAL #3   Title Patient ambulates 500' with rollator walker or 2 forearm crutches with 50' on grass with AFO & prosthesis modified independent to enable community mobility.    Time 12    Period Weeks    Status On-going    Target Date 03/11/20      PT LONG TERM GOAL #4   Title Dynamic Gait Index with 2 forearm crutches >/= 14/24    Time 12    Period Weeks    Status On-going    Target Date 03/11/20      PT LONG TERM GOAL #5   Title Patient negotiates ramps & curbs with 2 forearm crutches or rollator walker & prosthesis modified independent for community  access.    Time 12    Period Weeks    Status On-going    Target Date 03/11/20      PT LONG TERM GOAL #6   Title Patient ambulates 27' around furniture carrying item in free UE with single forearm crutch, prosthesis & AFO modified independent.    Time 12    Period Weeks    Status On-going    Target Date 03/11/20      PT LONG TERM GOAL #7   Title Timed Up & Go with single forearm crutch <20 seconds.    Time 12    Period Weeks    Status Revised    Target Date 03/11/20                 Plan - 02/05/20 1152    Clinical Impression Statement Continued to work on endurance, leg strength, and gait with single crutch with focus on carrying objects and turns. She showed good tolerance to exercises. Continue to progress as able.    Personal Factors and Comorbidities Comorbidity 3+;Fitness;Time since onset of injury/illness/exacerbation    Comorbidities L TTA, DM, CVA, sleep apnea, PNA, neuropathy bil. feet and hand, HTN, s/p cervical fusion, s/p back surgery, IBS, asthma, Covid in Jan 2021    Examination-Activity Limitations Hygiene/Grooming;Locomotion Level;Reach Overhead;Stairs;Stand;Transfers    Examination-Participation Restrictions Community Activity;Volunteer    Stability/Clinical Decision Making Evolving/Moderate complexity    Rehab Potential Good  PT Frequency 2x / week    PT Duration 12 weeks    PT Treatment/Interventions ADLs/Self Care Home Management;DME Instruction;Gait training;Stair training;Functional mobility training;Therapeutic activities;Therapeutic exercise;Balance training;Neuromuscular re-education;Patient/family education;Orthotic Fit/Training;Prosthetic Training;Vestibular;Manual techniques    PT Next Visit Plan balance & gait activities, therapuetic exercise, work towards updated STGs    PT Home Exercise Plan Access Code: [email or web address]    at sink for mini squats with UE support, weight shifts with UE support, and balance feet apart no Ue support all with chair behind  her    Consulted and Agree with Plan of Care Patient           Patient will benefit from skilled therapeutic intervention in order to improve the following deficits and impairments:  Abnormal gait, Decreased activity tolerance, Decreased balance, Decreased endurance, Decreased knowledge of use of DME, Decreased mobility, Decreased range of motion, Decreased safety awareness, Decreased strength, Increased edema, Impaired flexibility, Impaired sensation, Postural dysfunction, Prosthetic Dependency, Obesity, Pain  Visit Diagnosis: Unsteadiness on feet  Other abnormalities of gait and mobility  Abnormal posture  History of fall  Muscle weakness (generalized)  Stiffness of left knee, not elsewhere classified  Weakness generalized     Problem List Patient Active Problem List   Diagnosis Date Noted  . Allergy to statin medication 12/01/2019  . History of below knee amputation, left ([place]) 07/30/2019  . Gangrene of left foot ([place]) 05/16/2019  . Subacute osteomyelitis of left foot ([place])   . Charcot's joint of foot, left   . Post-operative state   . PAD (peripheral artery disease) ([place]) 12/05/2018  . Osteomyelitis of ankle or foot, acute, left ([place]) 12/05/2018  . MRSA bacteremia 12/05/2018  . Sepsis without acute organ dysfunction ([place])   . Charcot's joint of foot due to diabetes ([place]) 09/18/2018  . BMI 36.0-36.9,adult   . Morbid obesity ([place]) 11/19/2017  . B12 deficiency 11/01/2017  . Diabetic foot ulcer ([place]) 12/14/2016  . Diabetic polyneuropathy associated with type 2 diabetes mellitus ([place]) 12/13/2015  . Lumbar stenosis with neurogenic claudication 09/02/2015  . Chronic constipation 07/13/2015  . OSA (obstructive sleep apnea) 06/25/2015  . 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/23/2015  . Hypertriglyceridemia 04/23/2015  . Asthma, mild intermittent 02/01/2015  . Type 2 diabetes mellitus with diabetic nephropathy ([place]) 12/31/2014  . Gastroesophageal reflux  disease with esophagitis 12/31/2014  . Dysphagia 12/31/2014  . Bilateral carpal tunnel syndrome 12/01/2014  . Diverticulosis of colon (without mention of hemorrhage) 11/30/2012  . Essential hypertension, benign 11/28/2012    Debbe Odea, PT,DPT 02/05/2020, 11:54 AM  [facility] [address] [place], [place], [address] Phone: [phone]   Fax:  [phone]  Name: WAYNETTA METHENY MRN: [id number] Date of Birth: 1956-05-27

## 2020-02-10 ENCOUNTER — Encounter: Payer: Self-pay | Admitting: Physical Therapy

## 2020-02-10 ENCOUNTER — Other Ambulatory Visit: Payer: Self-pay

## 2020-02-10 ENCOUNTER — Ambulatory Visit (INDEPENDENT_AMBULATORY_CARE_PROVIDER_SITE_OTHER): Payer: Medicare Other | Admitting: Physical Therapy

## 2020-02-10 DIAGNOSIS — M25662 Stiffness of left knee, not elsewhere classified: Secondary | ICD-10-CM

## 2020-02-10 DIAGNOSIS — M6281 Muscle weakness (generalized): Secondary | ICD-10-CM

## 2020-02-10 DIAGNOSIS — R2689 Other abnormalities of gait and mobility: Secondary | ICD-10-CM

## 2020-02-10 DIAGNOSIS — Z9181 History of falling: Secondary | ICD-10-CM | POA: Diagnosis not present

## 2020-02-10 DIAGNOSIS — R2681 Unsteadiness on feet: Secondary | ICD-10-CM | POA: Diagnosis not present

## 2020-02-10 DIAGNOSIS — R293 Abnormal posture: Secondary | ICD-10-CM | POA: Diagnosis not present

## 2020-02-10 NOTE — Therapy (Signed)
[facility] [address] [place], [place], [address] Phone: [phone]   Fax:  [phone]  Physical Therapy Treatment  Patient Details  Name: Olivia Romero MRN: [id number] Date of Birth: 12/31/1955 Referring Provider (PT): Meridee Score, MD   Encounter Date: 02/10/2020   PT End of Session - 02/10/20 1012    Visit Number 36    Number of Visits 12    Date for PT Re-Evaluation 03/11/20    Authorization Type [employer] Medicare, $30 co-pay, no visit limit    Progress Note Due on Visit 46    PT Start Time 1012    PT Stop Time 1100    PT Time Calculation (min) 48 min    Equipment Utilized During Treatment Gait belt    Activity Tolerance Patient tolerated treatment well    Behavior During Therapy [facility] for tasks assessed/performed           Past Medical History:  Diagnosis Date  . Anemia   . Arthritis   . Asthma   . Colon polyps    adenomatous  . Diabetes mellitus without complication ([place])   . Diabetic infection of left foot ([place]) 12/2018  . Diverticulosis of colon   . Esophagitis   . GERD (gastroesophageal reflux disease)   . Hemorrhoid    internal  . Hyperlipemia   . Hypertension   . IBS (irritable bowel syndrome)    no current prob - diet controlled  . Myalgia due to statin 11/19/2017  . Neuropathy   . Neuropathy of both feet   . Neuropathy of hand   . Pneumonia    x 4  . PONV (postoperative nausea and vomiting)    on some surgeries but not all procedures  . Skin ulcer of right ankle, limited to breakdown of skin ([place]) 12/31/2016   resolved per patient 05/14/19  . Sleep apnea    has had in the past lost 50 pounds and do longer uses cpap  . Statin intolerance 04/23/2015  . Stroke [facility]) 2009   mini stroke per patient  . Stroke-like episode 2009   TIA - mini stroke per patient  . 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 ([place]) 07/13/2015  . Uncontrolled type 2 diabetes mellitus with hyperglycemia ([place]) 11/01/2017     Past Surgical History:  Procedure Laterality Date  . ABDOMINAL HYSTERECTOMY    . AMPUTATION Left 12/06/2018   Procedure: PARTIAL AMPUTATION LEFT FOOT;  Surgeon: Edrick Kins, DPM;  Location: [place];  Service: Podiatry;  Laterality: Left;  . AMPUTATION Left 05/16/2019   Procedure: LEFT BELOW KNEE AMPUTATION;  Surgeon: Newt Minion, MD;  Location: [place];  Service: Orthopedics;  Laterality: Left;  . BACK SURGERY  09/02/2015  . BONE BIOPSY Left 12/06/2018   Procedure: Bone Biopsy;  Surgeon: Edrick Kins, DPM;  Location: [place];  Service: Podiatry;  Laterality: Left;  . CERVICAL FUSION    . CESAREAN SECTION  1986  . COLONOSCOPY N/A 11/30/2012   Procedure: COLONOSCOPY;  Surgeon: Irene Shipper, MD;  Location: [facility] ENDOSCOPY;  Service: Endoscopy;  Laterality: N/A;  . COLONOSCOPY WITH PROPOFOL N/A 01/03/2016   Procedure: COLONOSCOPY WITH PROPOFOL;  Surgeon: Manya Silvas, MD;  Location: [facility] ENDOSCOPY;  Service: Endoscopy;  Laterality: N/A;  . ESOPHAGOGASTRODUODENOSCOPY (EGD) WITH PROPOFOL N/A 01/14/2015   Procedure: ESOPHAGOGASTRODUODENOSCOPY (EGD) WITH PROPOFOL;  Surgeon: Manya Silvas, MD;  Location: [facility] ENDOSCOPY;  Service: Endoscopy;  Laterality: N/A;  . IRRIGATION AND DEBRIDEMENT FOOT Left 03/13/2018  Procedure: IRRIGATION AND DEBRIDEMENT FOOT WITH BONE BIOPSY WITH MISONIX DEBRIDER;  Surgeon: Evelina Bucy, DPM;  Location: [place];  Service: Podiatry;  Laterality: Left;  . IRRIGATION AND DEBRIDEMENT FOOT Left 12/06/2018   Procedure: Irrigation And Debridement Foot;  Surgeon: Edrick Kins, DPM;  Location: [place];  Service: Podiatry;  Laterality: Left;  . LEG AMPUTATION BELOW KNEE Left   . LUMBAR WOUND DEBRIDEMENT N/A 10/01/2015   Procedure: LUMBAR WOUND DEBRIDEMENT;  Surgeon: Ashok Pall, MD;  Location: [place] NEURO ORS;  Service: Neurosurgery;  Laterality: N/A;  LUMBAR WOUND DEBRIDEMENT  . NASAL SINUS SURGERY    . SAVORY DILATION N/A 01/14/2015   Procedure: SAVORY DILATION;  Surgeon: Manya Silvas, MD;  Location: [facility] ENDOSCOPY;  Service: Endoscopy;  Laterality: N/A;  . TONSILLECTOMY    . WISDOM TOOTH EXTRACTION      There were no vitals filed for this visit.   Subjective Assessment - 02/10/20 1012    Subjective She turned quickly when entering the elevator and got vertigo for couple of minutes.  She was able to remain standing without fall until vertigo cleared. She was able to walk in yard with crutches with supervision. She was able to clean carport picking up items holding rollator. She is also able to make her bed.    Pertinent History L TTA, DM, CVA, sleep apnea, PNA, neuropathy bil. 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 No/denies    Pain Onset In the past 7 days              [facility] PT Assessment - 02/10/20 1012      Assessment   Medical Diagnosis Left Transtibial Amputation    Referring Provider (PT) Meridee Score, MD      Timed Up and Go Test   Normal TUG (seconds) 19.78   single crutch                        [facility] Adult PT Treatment/Exercise - 02/10/20 1012      Transfers   Transfers Sit to Stand;Stand to Sit    Sit to Stand 5: Supervision;With upper extremity assist;With armrests;From chair/3-in-1;Other (comment)    Stand to Sit 5: Supervision;With upper extremity assist;With armrests;To chair/3-in-1;Other (comment)      Ambulation/Gait   Ambulation/Gait Yes    Ambulation/Gait Assistance 5: Supervision;4: Min guard    Ambulation Distance (Feet) 200 Feet   bilat FA crutches, then 40 ft X 3 single with turns   Assistive device Lofstrands;Prosthesis;R Forearm Crutch    Gait Pattern Step-through pattern;Decreased arm swing - left;Decreased step length - right;Decreased stance time  - left;Antalgic;Trunk flexed    Gait Comments carried bottle and plate for single crutch ambulation       Self-Care   Self-Care ADL's    ADL's pt education on shopping including bagging items with cold vs nonrefrigerator together and altenating with shopping cart & power cart to build endurance.       Knee/Hip Exercises: Aerobic   Nustep L6 X 12 min BUEs & BLEs    patient education on ADLs     Knee/Hip Exercises: Machines for Strengthening   Cybex Leg Press Shuttle leg press BLEs 112# 2X10  Balance Exercises - 02/10/20 1012      Balance Exercises: Standing   Standing Eyes Opened Wide (BOA);Solid surface;5 reps;Head turns    Standing Eyes Opened Limitations head turns right/left & up/down with cues to focus on stationary object at midline.  Pt standing in corner with chair back in front.                PT Short Term Goals - 01/27/20 1706      PT SHORT TERM GOAL #1   Title Patient demonstrates and verbalizes understanding of updated HEP. (All STGs Target Date: 02/13/2020)    Time 4    Period Weeks    Status On-going    Target Date 02/13/20      PT SHORT TERM GOAL #2   Title Patient verbalizes proper prosthetic care.    Time 4    Period Weeks    Status Revised    Target Date 02/13/20      PT SHORT TERM GOAL #3   Title Timed Up & Go with single crutch, AFO & prosthesis <25 seconds.    Time 4    Period Weeks    Status Revised    Target Date 02/13/20      PT SHORT TERM GOAL #4   Title Patient ambulates 61' around furniture / obstacles with single forearm crutches & prosthesis with supervision.    Time 4    Period Weeks    Status Revised    Target Date 02/13/20      PT SHORT TERM GOAL #5   Title --    Time --    Period --    Status --    Target Date --             PT Long Term Goals - 01/27/20 1709      PT LONG TERM GOAL #1   Title Patient verbalizes & demonstrates proper prosthetic care and tolerates wear >90% of awake hours without  skin issues to enable safe utilization of prosthesis.  (All LTGs Target Date 03/11/2020)    Time 12    Period Weeks    Status On-going    Target Date 03/11/20      PT LONG TERM GOAL #2   Title Tasks of [place] Balance Test with single forearm crutch support >36/56 indicating improved function with standing ADLs    Time 12    Period Weeks    Status On-going    Target Date 03/11/20      PT LONG TERM GOAL #3   Title Patient ambulates 500' with rollator walker or 2 forearm crutches with 50' on grass with AFO & prosthesis modified independent to enable community mobility.    Time 12    Period Weeks    Status On-going    Target Date 03/11/20      PT LONG TERM GOAL #4   Title Dynamic Gait Index with 2 forearm crutches >/= 14/24    Time 12    Period Weeks    Status On-going    Target Date 03/11/20      PT LONG TERM GOAL #5   Title Patient negotiates ramps & curbs with 2 forearm crutches or rollator walker & prosthesis modified independent for community access.    Time 12    Period Weeks    Status On-going    Target Date 03/11/20      PT LONG TERM GOAL #6   Title Patient ambulates 2' around furniture carrying item  in free UE with single forearm crutch, prosthesis & AFO modified independent.    Time 12    Period Weeks    Status On-going    Target Date 03/11/20      PT LONG TERM GOAL #7   Title Timed Up & Go with single forearm crutch <20 seconds.    Time 12    Period Weeks    Status Revised    Target Date 03/11/20                 Plan - 02/10/20 1012    Clinical Impression Statement Patient met STGs for TUG & gait with single crutch today. PT added standing in corner for head turn / balance / vestibular exercises today.  PT instructed in ADLs. She reports that she may move to live alone in [facility] senior center in future.    Personal Factors and Comorbidities Comorbidity 3+;Fitness;Time since onset of injury/illness/exacerbation    Comorbidities L TTA, DM, CVA, sleep  apnea, PNA, neuropathy bil. feet and hand, HTN, s/p cervical fusion, s/p back surgery, IBS, asthma, Covid in Jan 2021    Examination-Activity Limitations Hygiene/Grooming;Locomotion Level;Reach Overhead;Stairs;Stand;Transfers    Examination-Participation Restrictions Recruitment consultant Evolving/Moderate complexity    Rehab Potential Good    PT Frequency 2x / week    PT Duration 12 weeks    PT Treatment/Interventions ADLs/Self Care Home Management;DME Instruction;Gait training;Stair training;Functional mobility training;Therapeutic activities;Therapeutic exercise;Balance training;Neuromuscular re-education;Patient/family education;Orthotic Fit/Training;Prosthetic Training;Vestibular;Manual techniques    PT Next Visit Plan balance & gait activities, therapuetic exercise, check remaining updated STGs    PT Home Exercise Plan Access Code: [email or web address]    at sink for mini squats with UE support, weight shifts with UE support, and balance feet apart no Ue support all with chair behind her    Consulted and Agree with Plan of Care Patient           Patient will benefit from skilled therapeutic intervention in order to improve the following deficits and impairments:  Abnormal gait, Decreased activity tolerance, Decreased balance, Decreased endurance, Decreased knowledge of use of DME, Decreased mobility, Decreased range of motion, Decreased safety awareness, Decreased strength, Increased edema, Impaired flexibility, Impaired sensation, Postural dysfunction, Prosthetic Dependency, Obesity, Pain  Visit Diagnosis: Unsteadiness on feet  Other abnormalities of gait and mobility  Abnormal posture  History of fall  Muscle weakness (generalized)  Stiffness of left knee, not elsewhere classified     Problem List Patient Active Problem List   Diagnosis Date Noted  . Allergy to statin medication 12/01/2019  . History of below knee amputation, left ([place])  07/30/2019  . Gangrene of left foot ([place]) 05/16/2019  . Subacute osteomyelitis of left foot ([place])   . Charcot's joint of foot, left   . Post-operative state   . PAD (peripheral artery disease) ([place]) 12/05/2018  . Osteomyelitis of ankle or foot, acute, left ([place]) 12/05/2018  . MRSA bacteremia 12/05/2018  . Sepsis without acute organ dysfunction ([place])   . Charcot's joint of foot due to diabetes ([place]) 09/18/2018  . BMI 36.0-36.9,adult   . Morbid obesity ([place]) 11/19/2017  . B12 deficiency 11/01/2017  . Diabetic foot ulcer ([place]) 12/14/2016  . Diabetic polyneuropathy associated with type 2 diabetes mellitus ([place]) 12/13/2015  . Lumbar stenosis with neurogenic claudication 09/02/2015  . Chronic constipation 07/13/2015  . OSA (obstructive sleep apnea) 06/25/2015  . 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/23/2015  . Hypertriglyceridemia 04/23/2015  . Asthma, mild intermittent  02/01/2015  . Type 2 diabetes mellitus with diabetic nephropathy ([place]) 12/31/2014  . Gastroesophageal reflux disease with esophagitis 12/31/2014  . Dysphagia 12/31/2014  . Bilateral carpal tunnel syndrome 12/01/2014  . Diverticulosis of colon (without mention of hemorrhage) 11/30/2012  . Essential hypertension, benign 11/28/2012    Jamey Reas PT, DPT 02/10/2020, 11:44 AM  [facility] [address] [place], [place], [address] Phone: [phone]   Fax:  [phone]  Name: Olivia Romero MRN: [id number] Date of Birth: 20-Jun-1955

## 2020-02-12 ENCOUNTER — Ambulatory Visit (INDEPENDENT_AMBULATORY_CARE_PROVIDER_SITE_OTHER): Payer: Medicare Other | Admitting: Physical Therapy

## 2020-02-12 ENCOUNTER — Other Ambulatory Visit: Payer: Self-pay

## 2020-02-12 ENCOUNTER — Encounter: Payer: Self-pay | Admitting: Physical Therapy

## 2020-02-12 DIAGNOSIS — R2681 Unsteadiness on feet: Secondary | ICD-10-CM

## 2020-02-12 DIAGNOSIS — Z9181 History of falling: Secondary | ICD-10-CM | POA: Diagnosis not present

## 2020-02-12 DIAGNOSIS — R293 Abnormal posture: Secondary | ICD-10-CM | POA: Diagnosis not present

## 2020-02-12 DIAGNOSIS — R2689 Other abnormalities of gait and mobility: Secondary | ICD-10-CM

## 2020-02-12 DIAGNOSIS — M6281 Muscle weakness (generalized): Secondary | ICD-10-CM

## 2020-02-12 DIAGNOSIS — M25662 Stiffness of left knee, not elsewhere classified: Secondary | ICD-10-CM

## 2020-02-12 NOTE — Therapy (Signed)
[facility] [address] [place], [place], [address] Phone: [phone]   Fax:  [phone]  Physical Therapy Treatment  Patient Details  Name: Olivia Romero MRN: [id number] Date of Birth: [birth year] Referring Provider (PT): Meridee Score, MD   Encounter Date: 02/12/2020   PT End of Session - 02/12/20 0930    Visit Number 37    Number of Visits 69    Date for PT Re-Evaluation 03/11/20    Authorization Type [employer] Medicare, $30 co-pay, no visit limit    Progress Note Due on Visit 40    PT Start Time 0930    PT Stop Time 1018    PT Time Calculation (min) 48 min    Equipment Utilized During Treatment Gait belt    Activity Tolerance Patient tolerated treatment well    Behavior During Therapy [facility] for tasks assessed/performed           Past Medical History:  Diagnosis Date  . Anemia   . Arthritis   . Asthma   . Colon polyps    adenomatous  . Diabetes mellitus without complication ([place])   . Diabetic infection of left foot ([place]) 12/2018  . Diverticulosis of colon   . Esophagitis   . GERD (gastroesophageal reflux disease)   . Hemorrhoid    internal  . Hyperlipemia   . Hypertension   . IBS (irritable bowel syndrome)    no current prob - diet controlled  . Myalgia due to statin 11/19/2017  . Neuropathy   . Neuropathy of both feet   . Neuropathy of hand   . Pneumonia    x 4  . PONV (postoperative nausea and vomiting)    on some surgeries but not all procedures  . Skin ulcer of right ankle, limited to breakdown of skin ([place]) 12/31/2016   resolved per patient 05/14/19  . Sleep apnea    has had in the past lost 50 pounds and do longer uses cpap  . Statin intolerance 04/23/2015  . Stroke [facility]) 2009   mini stroke per patient  . Stroke-like episode 2009   TIA - mini stroke per patient  . 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 Uncontrolled type 2 diabetes mellitus with hyperglycemia ([place]) 11/01/2017     Past Surgical History:  Procedure Laterality Date  . ABDOMINAL HYSTERECTOMY    . AMPUTATION Left 12/06/2018   Procedure: PARTIAL AMPUTATION LEFT FOOT;  Surgeon: Edrick Kins, DPM;  Location: [place];  Service: Podiatry;  Laterality: Left;  . AMPUTATION Left 05/16/2019   Procedure: LEFT BELOW KNEE AMPUTATION;  Surgeon: Newt Minion, MD;  Location: [place];  Service: Orthopedics;  Laterality: Left;  . BACK SURGERY  09/02/2015  . BONE BIOPSY Left 12/06/2018   Procedure: Bone Biopsy;  Surgeon: Edrick Kins, DPM;  Location: [place];  Service: Podiatry;  Laterality: Left;  . CERVICAL FUSION    . CESAREAN SECTION  1986  . COLONOSCOPY N/A 11/30/2012   Procedure: COLONOSCOPY;  Surgeon: Irene Shipper, MD;  Location: [facility] ENDOSCOPY;  Service: Endoscopy;  Laterality: N/A;  . COLONOSCOPY WITH PROPOFOL N/A 01/03/2016   Procedure: COLONOSCOPY WITH PROPOFOL;  Surgeon: Manya Silvas, MD;  Location: [facility] ENDOSCOPY;  Service: Endoscopy;  Laterality: N/A;  . ESOPHAGOGASTRODUODENOSCOPY (EGD) WITH PROPOFOL N/A 01/14/2015   Procedure: ESOPHAGOGASTRODUODENOSCOPY (EGD) WITH PROPOFOL;  Surgeon: Manya Silvas, MD;  Location: [facility] ENDOSCOPY;  Service: Endoscopy;  Laterality: N/A;  . IRRIGATION AND DEBRIDEMENT FOOT Left 03/13/2018  Procedure: IRRIGATION AND DEBRIDEMENT FOOT WITH BONE BIOPSY WITH MISONIX DEBRIDER;  Surgeon: Evelina Bucy, DPM;  Location: [place];  Service: Podiatry;  Laterality: Left;  . IRRIGATION AND DEBRIDEMENT FOOT Left 12/06/2018   Procedure: Irrigation And Debridement Foot;  Surgeon: Edrick Kins, DPM;  Location: [place];  Service: Podiatry;  Laterality: Left;  . LEG AMPUTATION BELOW KNEE Left   . LUMBAR WOUND DEBRIDEMENT N/A 10/01/2015   Procedure: LUMBAR WOUND DEBRIDEMENT;  Surgeon: Ashok Pall, MD;  Location: [place] NEURO ORS;  Service: Neurosurgery;  Laterality: N/A;  LUMBAR WOUND DEBRIDEMENT  . NASAL SINUS SURGERY    . SAVORY DILATION N/A 01/14/2015   Procedure: SAVORY DILATION;  Surgeon: Manya Silvas, MD;  Location: [facility] ENDOSCOPY;  Service: Endoscopy;  Laterality: N/A;  . TONSILLECTOMY    . WISDOM TOOTH EXTRACTION      There were no vitals filed for this visit.   Subjective Assessment - 02/12/20 0930    Subjective She did not try the new corner exercises yet standing but has done head turns sitting with feet on floor.    Pertinent History L TTA, DM, CVA, sleep apnea, PNA, neuropathy bil. 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 No/denies    Pain Onset In the past 7 days                             [facility] Adult PT Treatment/Exercise - 02/12/20 0930      Transfers   Transfers Sit to Stand;Stand to Sit    Sit to Stand 5: Supervision;With upper extremity assist;With armrests;From chair/3-in-1;Other (comment)    Stand to Sit 5: Supervision;With upper extremity assist;With armrests;To chair/3-in-1;Other (comment)      Ambulation/Gait   Ambulation/Gait Yes    Ambulation/Gait Assistance 5: Supervision;4: Min guard    Ambulation/Gait Assistance Details when walking single crutch carrying an item, verbal cues to alternate glancing at floor, forward to stationary object and item that she is carrying.     Ambulation Distance (Feet) 200 Feet   50' X 2 and 30' X 1 with single crutch   Assistive device Lofstrands;Prosthesis;R Forearm Crutch    Gait Pattern Step-through pattern;Decreased arm swing - left;Decreased step length - right;Decreased stance time - left;Antalgic;Trunk flexed    Ambulation Surface Level;Indoor    Gait Comments carried bottle and plate for single crutch ambulation       High Level Balance   High Level Balance Activities Negotiating over obstacles    High Level Balance Comments verbal  & demo cues on technique and pt return demo 3 reps with supervision last rep.       Self-Care   Self-Care --    ADL's --      Knee/Hip Exercises: Aerobic   Nustep L6 X 10 min BUEs & BLEs    patient education on ADLs     Knee/Hip Exercises: Machines for Strengthening   Cybex Leg Press Shuttle leg press BLEs 112# 2X10               Balance Exercises - 02/12/20 0930      Balance Exercises: Standing   Standing Eyes Opened Wide (BOA);Solid surface;5 reps;Head turns  Standing Eyes Opened Limitations head turns right/left & up/down with cues to focus on stationary object at midline.  Pt standing in corner with chair back in front.     Standing Eyes Closed Wide (BOA);Solid surface;5 reps;10 secs   standing corner with chair back anteriorly   Standing Eyes Closed Limitations verbal cues on technique & rational. Pt verbalized understanding.                PT Short Term Goals - 02/12/20 1021      PT SHORT TERM GOAL #1   Title Patient demonstrates and verbalizes understanding of updated HEP. (All STGs Target Date: 02/13/2020)    Baseline MET 02/12/2020    Time 4    Period Weeks    Status Achieved    Target Date 02/13/20      PT SHORT TERM GOAL #2   Title Patient verbalizes proper prosthetic care.    Baseline MET 02/12/2020    Time 4    Period Weeks    Status Achieved    Target Date 02/13/20      PT SHORT TERM GOAL #3   Title Timed Up & Go with single crutch, AFO & prosthesis <25 seconds.    Baseline MET 02/10/2020    Time 4    Period Weeks    Status Revised    Target Date 02/13/20      PT SHORT TERM GOAL #4   Title Patient ambulates 10' around furniture / obstacles with single forearm crutches & prosthesis with supervision.    Baseline MET 02/12/2020    Time 4    Period Weeks    Status Achieved    Target Date 02/13/20             PT Long Term Goals - 01/27/20 1709      PT LONG TERM GOAL #1   Title Patient verbalizes & demonstrates proper prosthetic care and  tolerates wear >90% of awake hours without skin issues to enable safe utilization of prosthesis.  (All LTGs Target Date 03/11/2020)    Time 12    Period Weeks    Status On-going    Target Date 03/11/20      PT LONG TERM GOAL #2   Title Tasks of [place] Balance Test with single forearm crutch support >36/56 indicating improved function with standing ADLs    Time 12    Period Weeks    Status On-going    Target Date 03/11/20      PT LONG TERM GOAL #3   Title Patient ambulates 500' with rollator walker or 2 forearm crutches with 50' on grass with AFO & prosthesis modified independent to enable community mobility.    Time 12    Period Weeks    Status On-going    Target Date 03/11/20      PT LONG TERM GOAL #4   Title Dynamic Gait Index with 2 forearm crutches >/= 14/24    Time 12    Period Weeks    Status On-going    Target Date 03/11/20      PT LONG TERM GOAL #5   Title Patient negotiates ramps & curbs with 2 forearm crutches or rollator walker & prosthesis modified independent for community access.    Time 12    Period Weeks    Status On-going    Target Date 03/11/20      PT LONG TERM GOAL #6   Title Patient ambulates 95' around furniture carrying item in free  UE with single forearm crutch, prosthesis & AFO modified independent.    Time 12    Period Weeks    Status On-going    Target Date 03/11/20      PT LONG TERM GOAL #7   Title Timed Up & Go with single forearm crutch <20 seconds.    Time 12    Period Weeks    Status Revised    Target Date 03/11/20                 Plan - 02/12/20 0935    Clinical Impression Statement Patient met all STGs set for this 30-day period and is on target to meet LTGs in next 30 days.  PT instructed in stepping over obstacles with 2 crutches and she appears to have a general understanding.    Personal Factors and Comorbidities Comorbidity 3+;Fitness;Time since onset of injury/illness/exacerbation    Comorbidities L TTA, DM, CVA, sleep  apnea, PNA, neuropathy bil. feet and hand, HTN, s/p cervical fusion, s/p back surgery, IBS, asthma, Covid in Jan 2021    Examination-Activity Limitations Hygiene/Grooming;Locomotion Level;Reach Overhead;Stairs;Stand;Transfers    Examination-Participation Restrictions Recruitment consultant Evolving/Moderate complexity    Rehab Potential Good    PT Frequency 2x / week    PT Duration 12 weeks    PT Treatment/Interventions ADLs/Self Care Home Management;DME Instruction;Gait training;Stair training;Functional mobility training;Therapeutic activities;Therapeutic exercise;Balance training;Neuromuscular re-education;Patient/family education;Orthotic Fit/Training;Prosthetic Training;Vestibular;Manual techniques    PT Next Visit Plan balance & gait activities, therapuetic exercise, work towards [place] Access Code: [email or web address]    at sink for mini squats with UE support, weight shifts with UE support, and balance feet apart no Ue support all with chair behind her    Consulted and Agree with Plan of Care Patient           Patient will benefit from skilled therapeutic intervention in order to improve the following deficits and impairments:  Abnormal gait, Decreased activity tolerance, Decreased balance, Decreased endurance, Decreased knowledge of use of DME, Decreased mobility, Decreased range of motion, Decreased safety awareness, Decreased strength, Increased edema, Impaired flexibility, Impaired sensation, Postural dysfunction, Prosthetic Dependency, Obesity, Pain  Visit Diagnosis: Other abnormalities of gait and mobility  Unsteadiness on feet  Abnormal posture  History of fall  Muscle weakness (generalized)  Stiffness of left knee, not elsewhere classified     Problem List Patient Active Problem List   Diagnosis Date Noted  . Allergy to statin medication 12/01/2019  . History of below knee amputation, left ([place]) 07/30/2019    . Gangrene of left foot ([place]) 05/16/2019  . Subacute osteomyelitis of left foot ([place])   . Charcot's joint of foot, left   . Post-operative state   . PAD (peripheral artery disease) ([place]) 12/05/2018  . Osteomyelitis of ankle or foot, acute, left ([place]) 12/05/2018  . MRSA bacteremia 12/05/2018  . Sepsis without acute organ dysfunction ([place])   . Charcot's joint of foot due to diabetes ([place]) 09/18/2018  . BMI 36.0-36.9,adult   . Morbid obesity ([place]) 11/19/2017  . B12 deficiency 11/01/2017  . Diabetic foot ulcer ([place]) 12/14/2016  . Diabetic polyneuropathy associated with type 2 diabetes mellitus ([place]) 12/13/2015  . Lumbar stenosis with neurogenic claudication 09/02/2015  . Chronic constipation 07/13/2015  . OSA (obstructive sleep apnea) 06/25/2015  . 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/23/2015  . Hypertriglyceridemia 04/23/2015  . Asthma, mild intermittent 02/01/2015  . Type 2 diabetes mellitus with diabetic nephropathy (  [place]) 12/31/2014  . Gastroesophageal reflux disease with esophagitis 12/31/2014  . Dysphagia 12/31/2014  . Bilateral carpal tunnel syndrome 12/01/2014  . Diverticulosis of colon (without mention of hemorrhage) 11/30/2012  . Essential hypertension, benign 11/28/2012    Jamey Reas PT, DPT 02/12/2020, 10:25 AM  [facility] Physical Therapy [address] [place], [place], [address] Phone: [phone]   Fax:  [phone]  Name: TAKEILA THAYNE MRN: [id number] Date of Birth: 20-Jul-1955

## 2020-02-17 ENCOUNTER — Encounter: Payer: Self-pay | Admitting: Physical Therapy

## 2020-02-17 ENCOUNTER — Other Ambulatory Visit: Payer: Self-pay

## 2020-02-17 ENCOUNTER — Ambulatory Visit (INDEPENDENT_AMBULATORY_CARE_PROVIDER_SITE_OTHER): Payer: Medicare Other | Admitting: Physical Therapy

## 2020-02-17 DIAGNOSIS — M6281 Muscle weakness (generalized): Secondary | ICD-10-CM | POA: Diagnosis not present

## 2020-02-17 DIAGNOSIS — R2681 Unsteadiness on feet: Secondary | ICD-10-CM | POA: Diagnosis not present

## 2020-02-17 DIAGNOSIS — R293 Abnormal posture: Secondary | ICD-10-CM

## 2020-02-17 DIAGNOSIS — M25662 Stiffness of left knee, not elsewhere classified: Secondary | ICD-10-CM

## 2020-02-17 DIAGNOSIS — R2689 Other abnormalities of gait and mobility: Secondary | ICD-10-CM

## 2020-02-17 NOTE — Therapy (Signed)
[facility] [place], [place], [address] Phone: [phone]   Fax:  [phone]  Physical Therapy Treatment  Patient Details  Name: Olivia Romero MRN: [id number] Date of Birth: 1956-01-17 Referring Provider (PT): Meridee Score, MD   Encounter Date: 02/17/2020   PT End of Session - 02/17/20 1018    Visit Number 38    Number of Visits 56    Date for PT Re-Evaluation 03/11/20    Authorization Type [employer] Medicare, $30 co-pay, no visit limit    Progress Note Due on Visit 56    PT Start Time 1018    PT Stop Time 1103    PT Time Calculation (min) 45 min    Equipment Utilized During Treatment Gait belt    Activity Tolerance Patient tolerated treatment well    Behavior During Therapy WFL for tasks assessed/performed           Past Medical History:  Diagnosis Date   Anemia    Arthritis    Asthma    Colon polyps    adenomatous   Diabetes mellitus without complication ([place])    Diabetic infection of left foot ([place]) 12/2018   Diverticulosis of colon    Esophagitis    GERD (gastroesophageal reflux disease)    Hemorrhoid    internal   Hyperlipemia    Hypertension    IBS (irritable bowel syndrome)    no current prob - diet controlled   Myalgia due to statin 11/19/2017   Neuropathy    Neuropathy of both feet    Neuropathy of hand    Pneumonia    x 4   PONV (postoperative nausea and vomiting)    on some surgeries but not all procedures   Skin ulcer of right ankle, limited to breakdown of skin ([place]) 12/31/2016   resolved per patient 05/14/19   Sleep apnea    has had in the past lost 50 pounds and do longer uses cpap   Statin intolerance 04/23/2015   Stroke ([place]) 2009   mini stroke per patient   Stroke-like episode 2009   TIA - mini stroke per patient   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 ([place]) 07/13/2015   Uncontrolled type 2 diabetes mellitus with hyperglycemia ([place]) 11/01/2017     Past Surgical History:  Procedure Laterality Date   ABDOMINAL HYSTERECTOMY     AMPUTATION Left 12/06/2018   Procedure: PARTIAL AMPUTATION LEFT FOOT;  Surgeon: Edrick Kins, DPM;  Location: [facility];  Service: Podiatry;  Laterality: Left;   AMPUTATION Left 05/16/2019   Procedure: LEFT BELOW KNEE AMPUTATION;  Surgeon: Newt Minion, MD;  Location: [facility];  Service: Orthopedics;  Laterality: Left;   BACK SURGERY  09/02/2015   BONE BIOPSY Left 12/06/2018   Procedure: Bone Biopsy;  Surgeon: Edrick Kins, DPM;  Location: [facility];  Service: Podiatry;  Laterality: Left;   [place]   COLONOSCOPY N/A 11/30/2012   Procedure: COLONOSCOPY;  Surgeon: Irene Shipper, MD;  Location: [facility] ENDOSCOPY;  Service: Endoscopy;  Laterality: N/A;   COLONOSCOPY WITH PROPOFOL N/A 01/03/2016   Procedure: COLONOSCOPY WITH PROPOFOL;  Surgeon: Manya Silvas, MD;  Location: [facility] ENDOSCOPY;  Service: Endoscopy;  Laterality: N/A;   ESOPHAGOGASTRODUODENOSCOPY (EGD) WITH PROPOFOL N/A 01/14/2015   Procedure: ESOPHAGOGASTRODUODENOSCOPY (EGD) WITH PROPOFOL;  Surgeon: Manya Silvas, MD;  Location: [facility] ENDOSCOPY;  Service: Endoscopy;  Laterality: N/A;   IRRIGATION AND DEBRIDEMENT FOOT Left 03/13/2018  Procedure: IRRIGATION AND DEBRIDEMENT FOOT WITH BONE BIOPSY WITH MISONIX DEBRIDER;  Surgeon: Evelina Bucy, DPM;  Location: [facility];  Service: Podiatry;  Laterality: Left;   IRRIGATION AND DEBRIDEMENT FOOT Left 12/06/2018   Procedure: Irrigation And Debridement Foot;  Surgeon: Edrick Kins, DPM;  Location: [facility];  Service: Podiatry;  Laterality: Left;   LEG AMPUTATION BELOW KNEE Left    LUMBAR WOUND DEBRIDEMENT N/A 10/01/2015   Procedure: LUMBAR WOUND DEBRIDEMENT;  Surgeon: Ashok Pall, MD;  Location: [facility]UR[facility];  Service: Neurosurgery;  Laterality: N/A;  LUMBAR WOUND DEBRIDEMENT   NASAL SINUS SURGERY     SAVORY DILATION N/A 01/14/2015   Procedure: SAVORY DILATION;  Surgeon: Manya Silvas, MD;  Location: [facility] ENDOSCOPY;  Service: Endoscopy;  Laterality: N/A;   TONSILLECTOMY     WISDOM TOOTH EXTRACTION      There were no vitals filed for this visit.   Subjective Assessment - 02/17/20 1018    Subjective She walked with single crutch in home small amounts and rollator outside as needed to sit on it to go thru her shed.    Pertinent History L TTA, DM, CVA, sleep apnea, PNA, neuropathy bil. 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 No/denies    Pain Onset In the past 7 days                             [facility] Adult PT Treatment/Exercise - 02/17/20 1020      Transfers   Transfers Sit to Stand;Stand to Sit    Sit to Stand 5: Supervision;With upper extremity assist;With armrests;From chair/3-in-1;Other (comment)    Stand to Sit 5: Supervision;With upper extremity assist;With armrests;To chair/3-in-1;Other (comment)      Ambulation/Gait   Ambulation/Gait Yes    Ambulation/Gait Assistance 5: Supervision;4: Min guard    Ambulation/Gait Assistance Details worked on balance in gait while scanning right, left & upward ambulating with single crutch. PT explained that we look at noises & light changes instinctly and need to be able to maintain balance.     Ambulation Distance (Feet) 200 Feet   50' X 4 single crutch & 200' X 2 with 2 crutches enter/exit   Assistive device Lofstrands;Prosthesis;R Forearm Crutch    Gait Pattern Step-through pattern;Decreased arm swing - left;Decreased step length - right;Decreased stance time - left;Antalgic;Trunk flexed    Ambulation Surface Level;Indoor    Gait Comments carried bottle and plate around furniture for single crutch ambulation       High Level Balance    High Level Balance Activities --    High Level Balance Comments --      Neuro Re-ed    Neuro Re-ed Details  standing with PT assistance for upright & balance: red theraband alternating UEs & BUEs 5 reps ea. for rows & forward punch.       Knee/Hip Exercises: Aerobic   Nustep L6 X 10 min BUEs & BLEs    patient education on ADLs     Knee/Hip Exercises: Machines for Strengthening   Cybex Leg Press Shuttle leg press BLEs 118# 2X10  PT Short Term Goals - 02/12/20 1021      PT SHORT TERM GOAL #1   Title Patient demonstrates and verbalizes understanding of updated HEP. (All STGs Target Date: 02/13/2020)    Baseline MET 02/12/2020    Time 4    Period Weeks    Status Achieved    Target Date 02/13/20      PT SHORT TERM GOAL #2   Title Patient verbalizes proper prosthetic care.    Baseline MET 02/12/2020    Time 4    Period Weeks    Status Achieved    Target Date 02/13/20      PT SHORT TERM GOAL #3   Title Timed Up & Go with single crutch, AFO & prosthesis <25 seconds.    Baseline MET 02/10/2020    Time 4    Period Weeks    Status Revised    Target Date 02/13/20      PT SHORT TERM GOAL #4   Title Patient ambulates 42' around furniture / obstacles with single forearm crutches & prosthesis with supervision.    Baseline MET 02/12/2020    Time 4    Period Weeks    Status Achieved    Target Date 02/13/20             PT Long Term Goals - 01/27/20 1709      PT LONG TERM GOAL #1   Title Patient verbalizes & demonstrates proper prosthetic care and tolerates wear >90% of awake hours without skin issues to enable safe utilization of prosthesis.  (All LTGs Target Date 03/11/2020)    Time 12    Period Weeks    Status On-going    Target Date 03/11/20      PT LONG TERM GOAL #2   Title Tasks of [place] Balance Test with single forearm crutch support >36/56 indicating improved function with standing ADLs    Time 12    Period Weeks    Status On-going    Target  Date 03/11/20      PT LONG TERM GOAL #3   Title Patient ambulates 500' with rollator walker or 2 forearm crutches with 50' on grass with AFO & prosthesis modified independent to enable community mobility.    Time 12    Period Weeks    Status On-going    Target Date 03/11/20      PT LONG TERM GOAL #4   Title Dynamic Gait Index with 2 forearm crutches >/= 14/24    Time 12    Period Weeks    Status On-going    Target Date 03/11/20      PT LONG TERM GOAL #5   Title Patient negotiates ramps & curbs with 2 forearm crutches or rollator walker & prosthesis modified independent for community access.    Time 12    Period Weeks    Status On-going    Target Date 03/11/20      PT LONG TERM GOAL #6   Title Patient ambulates 61' around furniture carrying item in free UE with single forearm crutch, prosthesis & AFO modified independent.    Time 12    Period Weeks    Status On-going    Target Date 03/11/20      PT LONG TERM GOAL #7   Title Timed Up & Go with single forearm crutch <20 seconds.    Time 12    Period Weeks    Status Revised    Target Date 03/11/20  Plan - 02/17/20 1022    Clinical Impression Statement PT increased difficulty of standing balance with resistive upper body activities and single crutch gait with scanning. She was challenged by increase requiring PT assistance but improved with repetition    Personal Factors and Comorbidities Comorbidity 3+;Fitness;Time since onset of injury/illness/exacerbation    Comorbidities L TTA, DM, CVA, sleep apnea, PNA, neuropathy bil. feet and hand, HTN, s/p cervical fusion, s/p back surgery, IBS, asthma, Covid in Jan 2021    Examination-Activity Limitations Hygiene/Grooming;Locomotion Level;Reach Overhead;Stairs;Stand;Transfers    Examination-Participation Restrictions Recruitment consultant Evolving/Moderate complexity    Rehab Potential Good    PT Frequency 2x / week     PT Duration 12 weeks    PT Treatment/Interventions ADLs/Self Care Home Management;DME Instruction;Gait training;Stair training;Functional mobility training;Therapeutic activities;Therapeutic exercise;Balance training;Neuromuscular re-education;Patient/family education;Orthotic Fit/Training;Prosthetic Training;Vestibular;Manual techniques    PT Next Visit Plan balance & gait activities, therapuetic exercise, work towards [place] Access Code: [email or web address]    at sink for mini squats with UE support, weight shifts with UE support, and balance feet apart no Ue support all with chair behind her    Consulted and Agree with Plan of Care Patient           Patient will benefit from skilled therapeutic intervention in order to improve the following deficits and impairments:  Abnormal gait, Decreased activity tolerance, Decreased balance, Decreased endurance, Decreased knowledge of use of DME, Decreased mobility, Decreased range of motion, Decreased safety awareness, Decreased strength, Increased edema, Impaired flexibility, Impaired sensation, Postural dysfunction, Prosthetic Dependency, Obesity, Pain  Visit Diagnosis: Other abnormalities of gait and mobility  Unsteadiness on feet  Abnormal posture  Muscle weakness (generalized)  Stiffness of left knee, not elsewhere classified     Problem List Patient Active Problem List   Diagnosis Date Noted   Allergy to statin medication 12/01/2019   History of below knee amputation, left ([place]) 07/30/2019   Gangrene of left foot ([place]) 05/16/2019   Subacute osteomyelitis of left foot ([place])    Charcot's joint of foot, left    Post-operative state    PAD (peripheral artery disease) ([place]) 12/05/2018   Osteomyelitis of ankle or foot, acute, left ([place]) 12/05/2018   MRSA bacteremia 12/05/2018   Sepsis without acute organ dysfunction ([place])    Charcot's joint of foot due to diabetes ([place]) 09/18/2018   BMI 36.0-36.9,adult     Morbid obesity ([place]) 11/19/2017   B12 deficiency 11/01/2017   Diabetic foot ulcer ([place]) 12/14/2016   Diabetic polyneuropathy associated with type 2 diabetes mellitus ([place]) 12/13/2015   Lumbar stenosis with neurogenic claudication 09/02/2015   Chronic constipation 07/13/2015   OSA (obstructive sleep apnea) 06/25/2015   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/23/2015   Hypertriglyceridemia 04/23/2015   Asthma, mild intermittent 02/01/2015   Type 2 diabetes mellitus with diabetic nephropathy ([place]) 12/31/2014   Gastroesophageal reflux disease with esophagitis 12/31/2014   Dysphagia 12/31/2014   Bilateral carpal tunnel syndrome 12/01/2014   Diverticulosis of colon (without mention of hemorrhage) 11/30/2012   Essential hypertension, benign 11/28/2012    Jamey Reas, PT, DPT 02/17/2020, 11:12 AM  [facility] [address] [place], [place], [address] Phone: [phone]   Fax:  [phone]  Name: Olivia Romero MRN: [id number] Date of Birth: [birth year]

## 2020-02-19 ENCOUNTER — Ambulatory Visit: Payer: Medicare Other | Admitting: Physical Therapy

## 2020-02-19 ENCOUNTER — Encounter: Payer: Self-pay | Admitting: Physical Therapy

## 2020-02-19 ENCOUNTER — Other Ambulatory Visit: Payer: Self-pay

## 2020-02-19 DIAGNOSIS — M6281 Muscle weakness (generalized): Secondary | ICD-10-CM | POA: Diagnosis not present

## 2020-02-19 DIAGNOSIS — M25662 Stiffness of left knee, not elsewhere classified: Secondary | ICD-10-CM

## 2020-02-19 DIAGNOSIS — R2689 Other abnormalities of gait and mobility: Secondary | ICD-10-CM

## 2020-02-19 DIAGNOSIS — R2681 Unsteadiness on feet: Secondary | ICD-10-CM | POA: Diagnosis not present

## 2020-02-19 DIAGNOSIS — R293 Abnormal posture: Secondary | ICD-10-CM | POA: Diagnosis not present

## 2020-02-19 NOTE — Therapy (Signed)
[facility] Physical Therapy [address] [place], [place], [address] Phone: [phone]   Fax:  [phone]  Physical Therapy Treatment  Patient Details  Name: SHANNAH CONTEH MRN: [id number] Date of Birth: 03/17/1956 Referring Provider (PT): Meridee Score, MD   Encounter Date: 02/19/2020   PT End of Session - 02/19/20 0929    Visit Number 42    Number of Visits 63    Date for PT Re-Evaluation 03/11/20    Authorization Type [employer] Medicare, $30 co-pay, no visit limit    Progress Note Due on Visit 71    PT Start Time 0929    PT Stop Time 1015    PT Time Calculation (min) 46 min    Equipment Utilized During Treatment Gait belt    Activity Tolerance Patient tolerated treatment well    Behavior During Therapy [facility] for tasks assessed/performed           Past Medical History:  Diagnosis Date  . Anemia   . Arthritis   . Asthma   . Colon polyps    adenomatous  . Diabetes mellitus without complication ([place])   . Diabetic infection of left foot ([place]) 12/2018  . Diverticulosis of colon   . Esophagitis   . GERD (gastroesophageal reflux disease)   . Hemorrhoid    internal  . Hyperlipemia   . Hypertension   . IBS (irritable bowel syndrome)    no current prob - diet controlled  . Myalgia due to statin 11/19/2017  . Neuropathy   . Neuropathy of both feet   . Neuropathy of hand   . Pneumonia    x 4  . PONV (postoperative nausea and vomiting)    on some surgeries but not all procedures  . Skin ulcer of right ankle, limited to breakdown of skin ([place]) 12/31/2016   resolved per patient 05/14/19  . Sleep apnea    has had in the past lost 50 pounds and do longer uses cpap  . Statin intolerance 04/23/2015  . Stroke [facility]) 2009   mini stroke per patient  . Stroke-like episode 2009   TIA - mini stroke per patient  . 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 ([place]) 07/13/2015  . Uncontrolled type 2 diabetes mellitus with hyperglycemia ([place]) 11/01/2017     Past Surgical History:  Procedure Laterality Date  . ABDOMINAL HYSTERECTOMY    . AMPUTATION Left 12/06/2018   Procedure: PARTIAL AMPUTATION LEFT FOOT;  Surgeon: Edrick Kins, DPM;  Location: [place];  Service: Podiatry;  Laterality: Left;  . AMPUTATION Left 05/16/2019   Procedure: LEFT BELOW KNEE AMPUTATION;  Surgeon: Newt Minion, MD;  Location: [place];  Service: Orthopedics;  Laterality: Left;  . BACK SURGERY  09/02/2015  . BONE BIOPSY Left 12/06/2018   Procedure: Bone Biopsy;  Surgeon: Edrick Kins, DPM;  Location: [place];  Service: Podiatry;  Laterality: Left;  . CERVICAL FUSION    . CESAREAN SECTION  1986  . COLONOSCOPY N/A 11/30/2012   Procedure: COLONOSCOPY;  Surgeon: Irene Shipper, MD;  Location: [facility] ENDOSCOPY;  Service: Endoscopy;  Laterality: N/A;  . COLONOSCOPY WITH PROPOFOL N/A 01/03/2016   Procedure: COLONOSCOPY WITH PROPOFOL;  Surgeon: Manya Silvas, MD;  Location: [facility] ENDOSCOPY;  Service: Endoscopy;  Laterality: N/A;  . ESOPHAGOGASTRODUODENOSCOPY (EGD) WITH PROPOFOL N/A 01/14/2015   Procedure: ESOPHAGOGASTRODUODENOSCOPY (EGD) WITH PROPOFOL;  Surgeon: Manya Silvas, MD;  Location: [facility] ENDOSCOPY;  Service: Endoscopy;  Laterality: N/A;  . IRRIGATION AND DEBRIDEMENT FOOT Left 03/13/2018  Procedure: IRRIGATION AND DEBRIDEMENT FOOT WITH BONE BIOPSY WITH MISONIX DEBRIDER;  Surgeon: Evelina Bucy, DPM;  Location: [place];  Service: Podiatry;  Laterality: Left;  . IRRIGATION AND DEBRIDEMENT FOOT Left 12/06/2018   Procedure: Irrigation And Debridement Foot;  Surgeon: Edrick Kins, DPM;  Location: [facility];  Service: Podiatry;  Laterality: Left;  . LEG AMPUTATION BELOW KNEE Left   . LUMBAR WOUND DEBRIDEMENT N/A 10/01/2015   Procedure: LUMBAR WOUND DEBRIDEMENT;  Surgeon: Ashok Pall, MD;  Location: [place] NEURO ORS;  Service: Neurosurgery;  Laterality: N/A;  LUMBAR WOUND DEBRIDEMENT  . NASAL SINUS SURGERY    . SAVORY DILATION N/A 01/14/2015   Procedure: SAVORY DILATION;  Surgeon: Manya Silvas, MD;  Location: [facility] ENDOSCOPY;  Service: Endoscopy;  Laterality: N/A;  . TONSILLECTOMY    . WISDOM TOOTH EXTRACTION      There were no vitals filed for this visit.   Subjective Assessment - 02/19/20 0929    Subjective She has been tired over last 2 days which maybe related to increased PT activities &/or increased activities at home.    Pertinent History L TTA, DM, CVA, sleep apnea, PNA, neuropathy bil. 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 No/denies    Pain Onset In the past 7 days                             [facility] Adult PT Treatment/Exercise - 02/19/20 0929      Transfers   Transfers Sit to Stand;Stand to Sit    Sit to Stand 5: Supervision;With upper extremity assist;With armrests;From chair/3-in-1;Other (comment)    Stand to Sit 5: Supervision;With upper extremity assist;With armrests;To chair/3-in-1;Other (comment)      Ambulation/Gait   Ambulation/Gait Yes    Ambulation/Gait Assistance 5: Supervision;4: Min guard    Ambulation Distance (Feet) 200 Feet   50' X 4 single crutch & 200' X 2 with 2 crutches enter/exit   Assistive device Lofstrands;Prosthesis;R Forearm Crutch    Gait Pattern Step-through pattern;Decreased arm swing - left;Decreased step length - right;Decreased stance time - left;Antalgic;Trunk flexed    Gait Comments carried bottle and plate around furniture while conversing for single crutch ambulation with supervision.       High Level Balance   High Level Balance Activities Side stepping;Backward walking;Head turns;Negotiating over obstacles   ambulating with single crutch   High Level Balance Comments tactile & verbal cues on technique and min to modA for balance reactions.        Neuro Re-ed    Neuro Re-ed Details  standing with PT assistance for upright & balance: red theraband alternating UEs & BUEs 5 reps ea. for rows & forward punch.       Knee/Hip Exercises: Aerobic   Nustep L6 X 10 min BUEs & BLEs    patient education on ADLs     Knee/Hip Exercises: Machines for Strengthening   Cybex Leg Press Shuttle leg press BLEs 118# 2X10                    PT Short Term Goals - 02/12/20 1021      PT SHORT TERM GOAL #1   Title Patient demonstrates  and verbalizes understanding of updated HEP. (All STGs Target Date: 02/13/2020)    Baseline MET 02/12/2020    Time 4    Period Weeks    Status Achieved    Target Date 02/13/20      PT SHORT TERM GOAL #2   Title Patient verbalizes proper prosthetic care.    Baseline MET 02/12/2020    Time 4    Period Weeks    Status Achieved    Target Date 02/13/20      PT SHORT TERM GOAL #3   Title Timed Up & Go with single crutch, AFO & prosthesis <25 seconds.    Baseline MET 02/10/2020    Time 4    Period Weeks    Status Revised    Target Date 02/13/20      PT SHORT TERM GOAL #4   Title Patient ambulates 69' around furniture / obstacles with single forearm crutches & prosthesis with supervision.    Baseline MET 02/12/2020    Time 4    Period Weeks    Status Achieved    Target Date 02/13/20             PT Long Term Goals - 01/27/20 1709      PT LONG TERM GOAL #1   Title Patient verbalizes & demonstrates proper prosthetic care and tolerates wear >90% of awake hours without skin issues to enable safe utilization of prosthesis.  (All LTGs Target Date 03/11/2020)    Time 12    Period Weeks    Status On-going    Target Date 03/11/20      PT LONG TERM GOAL #2   Title Tasks of [place] Balance Test with single forearm crutch support >36/56 indicating improved function with standing ADLs    Time 12    Period Weeks    Status On-going    Target Date 03/11/20      PT LONG TERM GOAL #3   Title Patient ambulates  500' with rollator walker or 2 forearm crutches with 50' on grass with AFO & prosthesis modified independent to enable community mobility.    Time 12    Period Weeks    Status On-going    Target Date 03/11/20      PT LONG TERM GOAL #4   Title Dynamic Gait Index with 2 forearm crutches >/= 14/24    Time 12    Period Weeks    Status On-going    Target Date 03/11/20      PT LONG TERM GOAL #5   Title Patient negotiates ramps & curbs with 2 forearm crutches or rollator walker & prosthesis modified independent for community access.    Time 12    Period Weeks    Status On-going    Target Date 03/11/20      PT LONG TERM GOAL #6   Title Patient ambulates 38' around furniture carrying item in free UE with single forearm crutch, prosthesis & AFO modified independent.    Time 12    Period Weeks    Status On-going    Target Date 03/11/20      PT LONG TERM GOAL #7   Title Timed Up & Go with single forearm crutch <20 seconds.    Time 12    Period Weeks    Status Revised    Target Date 03/11/20                 Plan - 02/19/20 0932    Clinical Impression  Statement PT added another component of conversing while carrying item or scanning to tasks ambulating with single crutch.  PT also added side stepping, backward gait & walking with eyes closed to simulate walking in dark with single crutch and required min to modA for balance.    Personal Factors and Comorbidities Comorbidity 3+;Fitness;Time since onset of injury/illness/exacerbation    Comorbidities L TTA, DM, CVA, sleep apnea, PNA, neuropathy bil. feet and hand, HTN, s/p cervical fusion, s/p back surgery, IBS, asthma, Covid in Jan 2021    Examination-Activity Limitations Hygiene/Grooming;Locomotion Level;Reach Overhead;Stairs;Stand;Transfers    Examination-Participation Restrictions Recruitment consultant Evolving/Moderate complexity    Rehab Potential Good    PT Frequency 2x / week     PT Duration 12 weeks    PT Treatment/Interventions ADLs/Self Care Home Management;DME Instruction;Gait training;Stair training;Functional mobility training;Therapeutic activities;Therapeutic exercise;Balance training;Neuromuscular re-education;Patient/family education;Orthotic Fit/Training;Prosthetic Training;Vestibular;Manual techniques    PT Next Visit Plan continue with increasing complexity of gait activities and work on side, back & eyes closed gait,  balance & gait activities, therapuetic exercise, work towards [place] Access Code: [email or web address]    at sink for mini squats with UE support, weight shifts with UE support, and balance feet apart no Ue support all with chair behind her    Consulted and Agree with Plan of Care Patient           Patient will benefit from skilled therapeutic intervention in order to improve the following deficits and impairments:  Abnormal gait, Decreased activity tolerance, Decreased balance, Decreased endurance, Decreased knowledge of use of DME, Decreased mobility, Decreased range of motion, Decreased safety awareness, Decreased strength, Increased edema, Impaired flexibility, Impaired sensation, Postural dysfunction, Prosthetic Dependency, Obesity, Pain  Visit Diagnosis: Other abnormalities of gait and mobility  Unsteadiness on feet  Muscle weakness (generalized)  Abnormal posture  Stiffness of left knee, not elsewhere classified     Problem List Patient Active Problem List   Diagnosis Date Noted  . Allergy to statin medication 12/01/2019  . History of below knee amputation, left ([place]) 07/30/2019  . Gangrene of left foot ([place]) 05/16/2019  . Subacute osteomyelitis of left foot ([place])   . Charcot's joint of foot, left   . Post-operative state   . PAD (peripheral artery disease) ([place]) 12/05/2018  . Osteomyelitis of ankle or foot, acute, left ([place]) 12/05/2018  . MRSA bacteremia 12/05/2018  . Sepsis without acute organ dysfunction  ([place])   . Charcot's joint of foot due to diabetes ([place]) 09/18/2018  . BMI 36.0-36.9,adult   . Morbid obesity ([place]) 11/19/2017  . B12 deficiency 11/01/2017  . Diabetic foot ulcer ( [place]) 12/14/2016  . Diabetic polyneuropathy associated with type 2 diabetes mellitus ([place]) 12/13/2015  . Lumbar stenosis with neurogenic claudication 09/02/2015  . Chronic constipation 07/13/2015  . OSA (obstructive sleep apnea) 06/25/2015  . 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/23/2015  . Hypertriglyceridemia 04/23/2015  . Asthma, mild intermittent 02/01/2015  . Type 2 diabetes mellitus with diabetic nephropathy ([place]) 12/31/2014  . Gastroesophageal reflux disease with esophagitis 12/31/2014  . Dysphagia 12/31/2014  . Bilateral carpal tunnel syndrome 12/01/2014  . Diverticulosis of colon (without mention of hemorrhage) 11/30/2012  . Essential hypertension, benign 11/28/2012    Jamey Reas, PT, DPT 02/19/2020, 10:20 AM  [facility] [address] [place], [place], [address] Phone: [phone]   Fax:  [phone]  Name: JESSIE COWHER MRN: [id number] Date of Birth: Oct 18, 1955

## 2020-02-23 ENCOUNTER — Ambulatory Visit: Payer: Medicare Other | Admitting: Podiatry

## 2020-02-23 ENCOUNTER — Other Ambulatory Visit: Payer: Self-pay

## 2020-02-23 DIAGNOSIS — B351 Tinea unguium: Secondary | ICD-10-CM | POA: Diagnosis not present

## 2020-02-23 DIAGNOSIS — E1149 Type 2 diabetes mellitus with other diabetic neurological complication: Secondary | ICD-10-CM | POA: Diagnosis not present

## 2020-02-23 DIAGNOSIS — M79674 Pain in right toe(s): Secondary | ICD-10-CM

## 2020-02-24 ENCOUNTER — Encounter: Payer: Self-pay | Admitting: Physical Therapy

## 2020-02-24 ENCOUNTER — Ambulatory Visit (INDEPENDENT_AMBULATORY_CARE_PROVIDER_SITE_OTHER): Payer: Medicare Other | Admitting: Physical Therapy

## 2020-02-24 DIAGNOSIS — R2681 Unsteadiness on feet: Secondary | ICD-10-CM

## 2020-02-24 DIAGNOSIS — R2689 Other abnormalities of gait and mobility: Secondary | ICD-10-CM

## 2020-02-24 DIAGNOSIS — M6281 Muscle weakness (generalized): Secondary | ICD-10-CM | POA: Diagnosis not present

## 2020-02-24 DIAGNOSIS — R293 Abnormal posture: Secondary | ICD-10-CM

## 2020-02-24 DIAGNOSIS — M25662 Stiffness of left knee, not elsewhere classified: Secondary | ICD-10-CM

## 2020-02-24 NOTE — Therapy (Signed)
[facility] Physical Therapy [address] [place], [place], [address] Phone: [phone]   Fax:  [phone]  Physical Therapy Treatment & 10th Visit Progress Note  Patient Details  Name: Olivia Romero MRN: [id number] Date of Birth: March 02, 1956 Referring Provider (PT): Meridee Score, MD   Encounter Date: 02/24/2020   Progress Note Reporting Period 01/22/2020 to 02/24/2020  See note below for Objective Data and Assessment of Progress/Goals.        PT End of Session - 02/24/20 1105    Visit Number 40    Number of Visits 72    Date for PT Re-Evaluation 03/11/20    Authorization Type [employer] Medicare, $30 co-pay, no visit limit    Progress Note Due on Visit 67    PT Start Time 1102    PT Stop Time 1145    PT Time Calculation (min) 43 min    Equipment Utilized During Treatment Gait belt    Activity Tolerance Patient tolerated treatment well    Behavior During Therapy WFL for tasks assessed/performed           Past Medical History:  Diagnosis Date   Anemia    Arthritis    Asthma    Colon polyps    adenomatous   Diabetes mellitus without complication ([place])    Diabetic infection of left foot ([place]) 12/2018   Diverticulosis of colon    Esophagitis    GERD (gastroesophageal reflux disease)    Hemorrhoid    internal   Hyperlipemia    Hypertension    IBS (irritable bowel syndrome)    no current prob - diet controlled   Myalgia due to statin 11/19/2017   Neuropathy    Neuropathy of both feet    Neuropathy of hand    Pneumonia    x 4   PONV (postoperative nausea and vomiting)    on some surgeries but not all procedures   Skin ulcer of right ankle, limited to breakdown of skin ([place]) 12/31/2016   resolved per patient 05/14/19   Sleep apnea    has had in the past lost 50 pounds and do longer uses cpap   Statin intolerance 04/23/2015   Stroke (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 ([place]) 07/13/2015   Uncontrolled type 2 diabetes mellitus with hyperglycemia ([place]) 11/01/2017    Past Surgical History:  Procedure Laterality Date   ABDOMINAL HYSTERECTOMY     AMPUTATION Left 12/06/2018   Procedure: PARTIAL AMPUTATION LEFT FOOT;  Surgeon: Edrick Kins, DPM;  Location: [place];  Service: Podiatry;  Laterality: Left;   AMPUTATION Left 05/16/2019   Procedure: LEFT BELOW KNEE AMPUTATION;  Surgeon: Newt Minion, MD;  Location: [place];  Service: Orthopedics;  Laterality: Left;   BACK SURGERY  09/02/2015   BONE BIOPSY Left 12/06/2018   Procedure: Bone Biopsy;  Surgeon: Edrick Kins, DPM;  Location: [place];  Service: Podiatry;  Laterality: Left;   [place]   COLONOSCOPY N/A 11/30/2012   Procedure: COLONOSCOPY;  Surgeon: Irene Shipper, MD;  Location: [facility] ENDOSCOPY;  Service: Endoscopy;  Laterality: N/A;   COLONOSCOPY WITH PROPOFOL N/A 01/03/2016   Procedure: COLONOSCOPY WITH PROPOFOL;  Surgeon: Manya Silvas, MD;  Location: [facility] ENDOSCOPY;  Service: Endoscopy;  Laterality: N/A;   ESOPHAGOGASTRODUODENOSCOPY (EGD) WITH PROPOFOL N/A 01/14/2015  Procedure: ESOPHAGOGASTRODUODENOSCOPY (EGD) WITH PROPOFOL;  Surgeon: Manya Silvas, MD;  Location: [facility] ENDOSCOPY;  Service: Endoscopy;  Laterality: N/A;   IRRIGATION AND DEBRIDEMENT FOOT Left 03/13/2018   Procedure: IRRIGATION AND DEBRIDEMENT FOOT WITH BONE BIOPSY WITH MISONIX DEBRIDER;  Surgeon: Evelina Bucy, DPM;  Location: [place];  Service: Podiatry;  Laterality: Left;   IRRIGATION AND DEBRIDEMENT FOOT Left 12/06/2018   Procedure: Irrigation And Debridement Foot;  Surgeon: Edrick Kins, DPM;  Location: [place];  Service: Podiatry;  Laterality: Left;   LEG AMPUTATION BELOW KNEE Left    LUMBAR WOUND DEBRIDEMENT N/A 10/01/2015   Procedure: LUMBAR WOUND DEBRIDEMENT;  Surgeon: Ashok Pall, MD;  Location: [place] [facility]URO ORS;  Service:  Neurosurgery;  Laterality: N/A;  LUMBAR WOUND DEBRIDEMENT   NASAL SINUS SURGERY     SAVORY DILATION N/A 01/14/2015   Procedure: SAVORY DILATION;  Surgeon: Manya Silvas, MD;  Location: [facility]COPY;  Service: Endoscopy;  Laterality: N/A;   TONSILLECTOMY     WISDOM TOOTH EXTRACTION      There were no vitals filed for this visit.   Subjective Assessment - 02/24/20 1102    Subjective She walked to neighbor's house with crutches without issues.  She feels that her long walks have improved her overall mobility.    Pertinent History L TTA, DM, CVA, sleep apnea, PNA, neuropathy bil. 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 No/denies    Pain Onset In the past 7 days                             [facility] Adult PT Treatment/Exercise - 02/24/20 1102      Transfers   Transfers Sit to Stand;Stand to Sit    Sit to Stand 5: Supervision;With upper extremity assist;With armrests;From chair/3-in-1;Other (comment)    Stand to Sit 5: Supervision;With upper extremity assist;With armrests;To chair/3-in-1;Other (comment)      Ambulation/Gait   Ambulation/Gait Yes    Ambulation/Gait Assistance 5: Supervision;4: Min guard    Ambulation Distance (Feet) 200 Feet   50' X 4 single crutch & 200' X 2 with 2 crutches enter/exit   Assistive device Lofstrands;Prosthesis;R Forearm Crutch    Gait Pattern Step-through pattern;Decreased arm swing - left;Decreased step length - right;Decreased stance time - left;Antalgic;Trunk flexed    Ambulation Surface Level;Indoor    Gait Comments carried bottle and plate around furniture while conversing for single crutch ambulation with supervision.       High Level Balance   High Level Balance  Activities Side stepping;Backward walking;Head turns;Other (comment)   walking with EC, single left crutch & light RUE counter   High Level Balance Comments tactile & verbal cues on technique. Used light RUE on counter to enable to add to HEP.       Neuro Re-ed    Neuro Re-ed Details  --      Knee/Hip Exercises: Stretches   Active Hamstring Stretch Right;Left;2 reps;20 seconds    Active Hamstring Stretch Limitations seated with leg extended with strap for gastroc stretch      Knee/Hip Exercises: Aerobic   Nustep L6 X 6 min BUEs & BLEs    patient education on ADLs  Knee/Hip Exercises: Machines for Strengthening   Cybex Leg Press Shuttle leg press BLEs 118# 10 reps marching with terminal stance 2 sets      Prosthetics   Current prosthetic wear tolerance (days/week)  daily    Current prosthetic wear tolerance (#hours/day)  prosthesis most of awake hours drying 3-4x/day    Residual limb condition  no issues per patient report                    PT Short Term Goals - 02/12/20 1021      PT SHORT TERM GOAL #1   Title Patient demonstrates and verbalizes understanding of updated HEP. (All STGs Target Date: 02/13/2020)    Baseline MET 02/12/2020    Time 4    Period Weeks    Status Achieved    Target Date 02/13/20      PT SHORT TERM GOAL #2   Title Patient verbalizes proper prosthetic care.    Baseline MET 02/12/2020    Time 4    Period Weeks    Status Achieved    Target Date 02/13/20      PT SHORT TERM GOAL #3   Title Timed Up & Go with single crutch, AFO & prosthesis <25 seconds.    Baseline MET 02/10/2020    Time 4    Period Weeks    Status Revised    Target Date 02/13/20      PT SHORT TERM GOAL #4   Title Patient ambulates 62' around furniture / obstacles with single forearm crutches & prosthesis with supervision.    Baseline MET 02/12/2020    Time 4    Period Weeks    Status Achieved    Target Date 02/13/20             PT Long Term Goals - 01/27/20 1709       PT LONG TERM GOAL #1   Title Patient verbalizes & demonstrates proper prosthetic care and tolerates wear >90% of awake hours without skin issues to enable safe utilization of prosthesis.  (All LTGs Target Date 03/11/2020)    Time 12    Period Weeks    Status On-going    Target Date 03/11/20      PT LONG TERM GOAL #2   Title Tasks of [place] Balance Test with single forearm crutch support >36/56 indicating improved function with standing ADLs    Time 12    Period Weeks    Status On-going    Target Date 03/11/20      PT LONG TERM GOAL #3   Title Patient ambulates 500' with rollator walker or 2 forearm crutches with 50' on grass with AFO & prosthesis modified independent to enable community mobility.    Time 12    Period Weeks    Status On-going    Target Date 03/11/20      PT LONG TERM GOAL #4   Title Dynamic Gait Index with 2 forearm crutches >/= 14/24    Time 12    Period Weeks    Status On-going    Target Date 03/11/20      PT LONG TERM GOAL #5   Title Patient negotiates ramps & curbs with 2 forearm crutches or rollator walker & prosthesis modified independent for community access.    Time 12    Period Weeks    Status On-going    Target Date 03/11/20      PT LONG TERM GOAL #6   Title Patient ambulates  66' around furniture carrying item in free UE with single forearm crutch, prosthesis & AFO modified independent.    Time 12    Period Weeks    Status On-going    Target Date 03/11/20      PT LONG TERM GOAL #7   Title Timed Up & Go with single forearm crutch <20 seconds.    Time 12    Period Weeks    Status Revised    Target Date 03/11/20                 Plan - 02/24/20 1105    Clinical Impression Statement Patient continues to progress her mobility & balance with prosthesis with PT. She reports that she is noticing ability to do things like look at plane when walking with 2 crutches with less balance issues. She is on target to meet LTGs by end of plan of care.     Personal Factors and Comorbidities Comorbidity 3+;Fitness;Time since onset of injury/illness/exacerbation    Comorbidities L TTA, DM, CVA, sleep apnea, PNA, neuropathy bil. feet and hand, HTN, s/p cervical fusion, s/p back surgery, IBS, asthma, Covid in Jan 2021    Examination-Activity Limitations Hygiene/Grooming;Locomotion Level;Reach Overhead;Stairs;Stand;Transfers    Examination-Participation Restrictions Recruitment consultant Evolving/Moderate complexity    Rehab Potential Good    PT Frequency 2x / week    PT Duration 12 weeks    PT Treatment/Interventions ADLs/Self Care Home Management;DME Instruction;Gait training;Stair training;Functional mobility training;Therapeutic activities;Therapeutic exercise;Balance training;Neuromuscular re-education;Patient/family education;Orthotic Fit/Training;Prosthetic Training;Vestibular;Manual techniques    PT Next Visit Plan continue with increasing complexity of gait activities and work on side, back & eyes closed gait,  balance & gait activities, therapuetic exercise, work towards [place] Access Code: [email or web address]    at sink for mini squats with UE support, weight shifts with UE support, and balance feet apart no Ue support all with chair behind her  9/21 added hamstring / gastroc combo stretch, counter side stepping, backward & eyes closed with left crutch & RUE counter or rail.    Consulted and Agree with Plan of Care Patient           Patient will benefit from skilled therapeutic intervention in order to improve the following deficits and impairments:  Abnormal gait, Decreased activity tolerance, Decreased balance, Decreased endurance, Decreased knowledge of use of DME, Decreased mobility, Decreased range of motion, Decreased safety awareness, Decreased strength, Increased edema, Impaired flexibility, Impaired sensation, Postural dysfunction, Prosthetic Dependency, Obesity, Pain  Visit  Diagnosis: Unsteadiness on feet  Other abnormalities of gait and mobility  Muscle weakness (generalized)  Abnormal posture  Stiffness of left knee, not elsewhere classified     Problem List Patient Active Problem List   Diagnosis Date Noted   Allergy to statin medication 12/01/2019   History of below knee amputation, left ([place]) 07/30/2019   Gangrene of left foot ([place]) 05/16/2019   Subacute osteomyelitis of left foot ([place])    Charcot's joint of foot, left    Post-operative state    PAD (peripheral artery disease) ([place]) 12/05/2018   Osteomyelitis of ankle or foot, acute, left ([place]) 12/05/2018   MRSA bacteremia 12/05/2018   Sepsis without acute organ dysfunction ([place])    Charcot's joint of foot due to diabetes ([place]) 09/18/2018   BMI 36.0-36.9,adult    Morbid obesity ([place]) 11/19/2017   B12 deficiency 11/01/2017   Diabetic foot ulcer ([place]) 12/14/2016   Diabetic polyneuropathy associated with type  2 diabetes mellitus ([place]) 12/13/2015   Lumbar stenosis with neurogenic claudication 09/02/2015   Chronic constipation 07/13/2015   OSA (obstructive sleep apnea) 06/25/2015   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/23/2015   Hypertriglyceridemia 04/23/2015   Asthma, mild intermittent 02/01/2015   Type 2 diabetes mellitus with diabetic nephropathy ([place]) 12/31/2014   Gastroesophageal reflux disease with esophagitis 12/31/2014   Dysphagia 12/31/2014   Bilateral carpal tunnel syndrome 12/01/2014   Diverticulosis of colon (without mention of hemorrhage) 11/30/2012   Essential hypertension, benign 11/28/2012    Jamey Reas, PT, DPT 02/24/2020, 12:47 PM  [facility] Physical Therapy [address] [place], [place], [address] Phone: [phone]   Fax:  [phone]  Name: Olivia Romero MRN: [id number] Date of Birth: 1955/12/22

## 2020-02-25 NOTE — Progress Notes (Signed)
Subjective: 64 y.o. returns the office today for painful, elongated, thickened toenails which she cannot trim herself as well as her right foot check.  States that she has been doing well no new sores identified in the right foot.  She presents today walking with a prosthetic on the left side with crutches. She has no concerns otherwise   PCP: Delsa Grana, PA-C  Objective: AAO 3, NAD DP/PT pulses palpable, CRT less than 3 seconds Left below-knee amputation Protective sensation decreased with Simms Weinstein monofilament, Nails hypertrophic, dystrophic, elongated, brittle, discolored 5. There is tenderness overlying the nails 1-5 on the right. There is no surrounding erythema or drainage along the nail sites. Prominence of the fourth metatarsal head plantarly without any hyperkeratotic tissue.  There is no open lesions identified. No pain with calf compression, swelling, warmth, erythema.  Assessment: Patient presents with symptomatic onychomycosis  Plan: -Treatment options including alternatives, risks, complications were discussed -Nails sharply debrided 5 without complication/bleeding. -Continue to monitor right foot daily.  Moisturizer daily. -Continue [place] brace on the right side for stability and offloading. -Discussed daily foot inspection. If there are any changes, to call the office immediately.  -Follow-up as scheduled or sooner if any problems are to arise. In the meantime, encouraged to call the office with any questions, concerns, changes symptoms.  Celesta Gentile, DPM

## 2020-02-26 ENCOUNTER — Other Ambulatory Visit: Payer: Self-pay

## 2020-02-26 ENCOUNTER — Encounter: Payer: Self-pay | Admitting: Physical Therapy

## 2020-02-26 ENCOUNTER — Ambulatory Visit (INDEPENDENT_AMBULATORY_CARE_PROVIDER_SITE_OTHER): Payer: Medicare Other | Admitting: Physical Therapy

## 2020-02-26 DIAGNOSIS — M6281 Muscle weakness (generalized): Secondary | ICD-10-CM

## 2020-02-26 DIAGNOSIS — R2681 Unsteadiness on feet: Secondary | ICD-10-CM

## 2020-02-26 DIAGNOSIS — R2689 Other abnormalities of gait and mobility: Secondary | ICD-10-CM | POA: Diagnosis not present

## 2020-02-26 DIAGNOSIS — R293 Abnormal posture: Secondary | ICD-10-CM | POA: Diagnosis not present

## 2020-02-26 DIAGNOSIS — M25662 Stiffness of left knee, not elsewhere classified: Secondary | ICD-10-CM

## 2020-02-26 NOTE — Therapy (Signed)
[facility] [address] [place], [place], [address] Phone: [phone]   Fax:  [phone]  Physical Therapy Treatment  Patient Details  Name: Olivia Romero MRN: [id number] Date of Birth: 1955/11/20 Referring Provider (PT): Meridee Score, MD   Encounter Date: 02/26/2020   PT End of Session - 02/26/20 1104    Visit Number 41    Number of Visits 3    Date for PT Re-Evaluation 03/11/20    Authorization Type [employer] Medicare, $30 co-pay, no visit limit    Progress Note Due on Visit 19    PT Start Time 1015    PT Stop Time 1058    PT Time Calculation (min) 43 min    Equipment Utilized During Treatment Gait belt    Activity Tolerance Patient tolerated treatment well    Behavior During Therapy WFL for tasks assessed/performed           Past Medical History:  Diagnosis Date  . Anemia   . Arthritis   . Asthma   . Colon polyps    adenomatous  . Diabetes mellitus without complication ([place])   . Diabetic infection of left foot ([place]) 12/2018  . Diverticulosis of colon   . Esophagitis   . GERD (gastroesophageal reflux disease)   . Hemorrhoid    internal  . Hyperlipemia   . Hypertension   . IBS (irritable bowel syndrome)    no current prob - diet controlled  . Myalgia due to statin 11/19/2017  . Neuropathy   . Neuropathy of both feet   . Neuropathy of hand   . Pneumonia    x 4  . PONV (postoperative nausea and vomiting)    on some surgeries but not all procedures  . Skin ulcer of right ankle, limited to breakdown of skin ([place]) 12/31/2016   resolved per patient 05/14/19  . Sleep apnea    has had in the past lost 50 pounds and do longer uses cpap  . Statin intolerance 04/23/2015  . Stroke [facility]) 2009   mini stroke per patient  . Stroke-like episode 2009   TIA - mini stroke per patient  . 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 ([place]) 07/13/2015  . Uncontrolled type 2 diabetes mellitus with hyperglycemia ([place]) 11/01/2017     Past Surgical History:  Procedure Laterality Date  . ABDOMINAL HYSTERECTOMY    . AMPUTATION Left 12/06/2018   Procedure: PARTIAL AMPUTATION LEFT FOOT;  Surgeon: Edrick Kins, DPM;  Location: [place];  Service: Podiatry;  Laterality: Left;  . AMPUTATION Left 05/16/2019   Procedure: LEFT BELOW KNEE AMPUTATION;  Surgeon: Newt Minion, MD;  Location: [place];  Service: Orthopedics;  Laterality: Left;  . BACK SURGERY  09/02/2015  . BONE BIOPSY Left 12/06/2018   Procedure: Bone Biopsy;  Surgeon: Edrick Kins, DPM;  Location: [place];  Service: Podiatry;  Laterality: Left;  . CERVICAL FUSION    . CESAREAN SECTION  1986  . COLONOSCOPY N/A 11/30/2012   Procedure: COLONOSCOPY;  Surgeon: Irene Shipper, MD;  Location: [facility]OSCOPY;  Service: Endoscopy;  Laterality: N/A;  . COLONOSCOPY WITH PROPOFOL N/A 01/03/2016   Procedure: COLONOSCOPY WITH PROPOFOL;  Surgeon: Manya Silvas, MD;  Location: [facility] ENDOSCOPY;  Service: Endoscopy;  Laterality: N/A;  . ESOPHAGOGASTRODUODENOSCOPY (EGD) WITH PROPOFOL N/A 01/14/2015   Procedure: ESOPHAGOGASTRODUODENOSCOPY (EGD) WITH PROPOFOL;  Surgeon: Manya Silvas, MD;  Location: [facility] ENDOSCOPY;  Service: Endoscopy;  Laterality: N/A;  . IRRIGATION AND DEBRIDEMENT FOOT Left 03/13/2018  Procedure: IRRIGATION AND DEBRIDEMENT FOOT WITH BONE BIOPSY WITH MISONIX DEBRIDER;  Surgeon: Evelina Bucy, DPM;  Location: [place];  Service: Podiatry;  Laterality: Left;  . IRRIGATION AND DEBRIDEMENT FOOT Left 12/06/2018   Procedure: Irrigation And Debridement Foot;  Surgeon: Edrick Kins, DPM;  Location: [place];  Service: Podiatry;  Laterality: Left;  . LEG AMPUTATION BELOW KNEE Left   . LUMBAR WOUND DEBRIDEMENT N/A 10/01/2015   Procedure: LUMBAR WOUND DEBRIDEMENT;  Surgeon: Ashok Pall, MD;  Location: [place] NEURO ORS;  Service: Neurosurgery;  Laterality: N/A;  LUMBAR WOUND DEBRIDEMENT  . NASAL SINUS SURGERY    . SAVORY DILATION N/A 01/14/2015   Procedure: SAVORY DILATION;  Surgeon: Manya Silvas, MD;  Location: [facility] ENDOSCOPY;  Service: Endoscopy;  Laterality: N/A;  . TONSILLECTOMY    . WISDOM TOOTH EXTRACTION      There were no vitals filed for this visit.   Subjective Assessment - 02/26/20 1015    Subjective She was a little tired & sore after PT on Tuesday but not bad. She continues to do her exercises at home.    Pertinent History L TTA, DM, CVA, sleep apnea, PNA, neuropathy bil. 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 No/denies                             [facility] Adult PT Treatment/Exercise - 02/26/20 1015      Transfers   Transfers Sit to Stand;Stand to Sit    Sit to Stand 5: Supervision;With upper extremity assist;With armrests;From chair/3-in-1;Other (comment)    Stand to Sit 5: Supervision;With upper extremity assist;With armrests;To chair/3-in-1;Other (comment)      Ambulation/Gait   Ambulation/Gait Yes    Ambulation/Gait Assistance 5: Supervision    Ambulation Distance (Feet) 200 Feet   50' X 4 single crutch & 200' X 2 with 2 crutches enter/exit   Assistive device Lofstrands;Prosthesis;R Forearm Crutch    Gait Pattern Step-through pattern;Decreased arm swing - left;Decreased step length - right;Decreased stance time - left;Antalgic;Trunk flexed    Ambulation Surface Level;Indoor    Ramp 6: Modified independent (Device)   2 crutches, prosthesis & AFO   Curb 5: Supervision   2 crutches, prosthesis & AFO   Curb Details (indicate cue type and reason) cues to remember sequence.  PT recommended doing curb >/= 1x/wk to help remember technique & maintain strentgh for function. Pt verbalized understanding.     Gait Comments carried bottle and plate around furniture while conversing for  single crutch ambulation with supervision.       High Level Balance   High Level Balance Activities Side stepping;Backward walking;Head turns;Other (comment)   walking with EC, single left crutch & light RUE counter   High Level Balance Comments tactile & verbal cues on technique. Used light RUE on counter to enable to add to HEP.       Self-Care   Lifting Moving items so near chair with single crutch & bil. crutches. Pt able to pick up item with LUE support on chair seat.       Knee/Hip Exercises: Stretches   Active Hamstring Stretch Right;Left;2 reps;20 seconds    Active Hamstring Stretch Limitations combo: seated  with leg extended with looped sheet (less hand strength / arthritic irritation) with gastroc stretch.       Knee/Hip Exercises: Aerobic   Nustep --      Knee/Hip Exercises: Machines for Strengthening   Cybex Leg Press Shuttle leg press BLEs 100# 10 reps marching with terminal stance 2 sets      Prosthetics   Current prosthetic wear tolerance (days/week)  daily    Current prosthetic wear tolerance (#hours/day)  prosthesis most of awake hours drying 3-4x/day    Residual limb condition  no issues per patient report                    PT Short Term Goals - 02/12/20 1021      PT SHORT TERM GOAL #1   Title Patient demonstrates and verbalizes understanding of updated HEP. (All STGs Target Date: 02/13/2020)    Baseline MET 02/12/2020    Time 4    Period Weeks    Status Achieved    Target Date 02/13/20      PT SHORT TERM GOAL #2   Title Patient verbalizes proper prosthetic care.    Baseline MET 02/12/2020    Time 4    Period Weeks    Status Achieved    Target Date 02/13/20      PT SHORT TERM GOAL #3   Title Timed Up & Go with single crutch, AFO & prosthesis <25 seconds.    Baseline MET 02/10/2020    Time 4    Period Weeks    Status Revised    Target Date 02/13/20      PT SHORT TERM GOAL #4   Title Patient ambulates 62' around furniture / obstacles with  single forearm crutches & prosthesis with supervision.    Baseline MET 02/12/2020    Time 4    Period Weeks    Status Achieved    Target Date 02/13/20             PT Long Term Goals - 01/27/20 1709      PT LONG TERM GOAL #1   Title Patient verbalizes & demonstrates proper prosthetic care and tolerates wear >90% of awake hours without skin issues to enable safe utilization of prosthesis.  (All LTGs Target Date 03/11/2020)    Time 12    Period Weeks    Status On-going    Target Date 03/11/20      PT LONG TERM GOAL #2   Title Tasks of [place] Balance Test with single forearm crutch support >36/56 indicating improved function with standing ADLs    Time 12    Period Weeks    Status On-going    Target Date 03/11/20      PT LONG TERM GOAL #3   Title Patient ambulates 500' with rollator walker or 2 forearm crutches with 50' on grass with AFO & prosthesis modified independent to enable community mobility.    Time 12    Period Weeks    Status On-going    Target Date 03/11/20      PT LONG TERM GOAL #4   Title Dynamic Gait Index with 2 forearm crutches >/= 14/24    Time 12    Period Weeks    Status On-going    Target Date 03/11/20      PT LONG TERM GOAL #5   Title Patient negotiates ramps & curbs with 2 forearm crutches or rollator walker & prosthesis modified independent for community access.  Time 12    Period Weeks    Status On-going    Target Date 03/11/20      PT LONG TERM GOAL #6   Title Patient ambulates 53' around furniture carrying item in free UE with single forearm crutch, prosthesis & AFO modified independent.    Time 12    Period Weeks    Status On-going    Target Date 03/11/20      PT LONG TERM GOAL #7   Title Timed Up & Go with single forearm crutch <20 seconds.    Time 12    Period Weeks    Status Revised    Target Date 03/11/20                 Plan - 02/26/20 1105    Clinical Impression Statement PT session worked on functional task towards  [place]. She appears on target to meet LTGs in next 2 weeks.  PT worked on [employer] with 2 crutches. She had no issues with ramp as does this at home regularly but needed cues for sequence on curb as she has not performed lately.    Personal Factors and Comorbidities Comorbidity 3+;Fitness;Time since onset of injury/illness/exacerbation    Comorbidities L TTA, DM, CVA, sleep apnea, PNA, neuropathy bil. feet and hand, HTN, s/p cervical fusion, s/p back surgery, IBS, asthma, Covid in Jan 2021    Examination-Activity Limitations Hygiene/Grooming;Locomotion Level;Reach Overhead;Stairs;Stand;Transfers    Examination-Participation Restrictions Recruitment consultant Evolving/Moderate complexity    Rehab Potential Good    PT Frequency 2x / week    PT Duration 12 weeks    PT Treatment/Interventions ADLs/Self Care Home Management;DME Instruction;Gait training;Stair training;Functional mobility training;Therapeutic activities;Therapeutic exercise;Balance training;Neuromuscular re-education;Patient/family education;Orthotic Fit/Training;Prosthetic Training;Vestibular;Manual techniques    PT Next Visit Plan continue with increasing complexity of gait activities and work on side, back & eyes closed gait,  balance & gait activities, therapuetic exercise, work towards [place] Access Code: [email or web address]    at sink for mini squats with UE support, weight shifts with UE support, and balance feet apart no Ue support all with chair behind her  9/21 added hamstring / gastroc combo stretch, counter side stepping, backward & eyes closed with left crutch & RUE counter or rail.    Consulted and Agree with Plan of Care Patient           Patient will benefit from skilled therapeutic intervention in order to improve the following deficits and impairments:  Abnormal gait, Decreased activity tolerance, Decreased balance, Decreased endurance, Decreased knowledge of use  of DME, Decreased mobility, Decreased range of motion, Decreased safety awareness, Decreased strength, Increased edema, Impaired flexibility, Impaired sensation, Postural dysfunction, Prosthetic Dependency, Obesity, Pain  Visit Diagnosis: Unsteadiness on feet  Other abnormalities of gait and mobility  Muscle weakness (generalized)  Abnormal posture  Stiffness of left knee, not elsewhere classified     Problem List Patient Active Problem List   Diagnosis Date Noted  . Allergy to statin medication 12/01/2019  . History of below knee amputation, left ([place]) 07/30/2019  . Gangrene of left foot ([place]) 05/16/2019  . Subacute osteomyelitis of left foot ([place])   . Charcot's joint of foot, left   . Post-operative state   . PAD (peripheral artery disease) ([place]) 12/05/2018  . Osteomyelitis of ankle or foot, acute, left ([place]) 12/05/2018  . MRSA bacteremia 12/05/2018  . Sepsis without acute organ dysfunction ([place])   .  Charcot's joint of foot due to diabetes ([place]) 09/18/2018  . BMI 36.0-36.9,adult   . Morbid obesity ([place]) 11/19/2017  . B12 deficiency 11/01/2017  . Diabetic foot ulcer ([place]) 12/14/2016  . Diabetic polyneuropathy associated with type 2 diabetes mellitus ([place]) 12/13/2015  . Lumbar stenosis with neurogenic claudication 09/02/2015  . Chronic constipation 07/13/2015  . OSA (obstructive sleep apnea) 06/25/2015  . 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/23/2015  . Hypertriglyceridemia 04/23/2015  . Asthma, mild intermittent 02/01/2015  . Type 2 diabetes mellitus with diabetic nephropathy ([place]) 12/31/2014  . Gastroesophageal reflux disease with esophagitis 12/31/2014  . Dysphagia 12/31/2014  . Bilateral carpal tunnel syndrome 12/01/2014  . Diverticulosis of colon (without mention of hemorrhage) 11/30/2012  . Essential hypertension, benign 11/28/2012    Jamey Reas PT, DPT 02/26/2020, 11:08 AM  [facility] [address] [place], [place], [address] Phone: [phone]   Fax:  [phone]  Name: Olivia Romero MRN: [id number] Date of Birth: 1955/08/26

## 2020-03-01 ENCOUNTER — Encounter: Payer: Self-pay | Admitting: Physical Therapy

## 2020-03-01 ENCOUNTER — Other Ambulatory Visit: Payer: Self-pay

## 2020-03-01 ENCOUNTER — Ambulatory Visit (INDEPENDENT_AMBULATORY_CARE_PROVIDER_SITE_OTHER): Payer: Medicare Other | Admitting: Physical Therapy

## 2020-03-01 DIAGNOSIS — M25662 Stiffness of left knee, not elsewhere classified: Secondary | ICD-10-CM

## 2020-03-01 DIAGNOSIS — Z9181 History of falling: Secondary | ICD-10-CM

## 2020-03-01 DIAGNOSIS — R293 Abnormal posture: Secondary | ICD-10-CM

## 2020-03-01 DIAGNOSIS — R2681 Unsteadiness on feet: Secondary | ICD-10-CM | POA: Diagnosis not present

## 2020-03-01 DIAGNOSIS — M6281 Muscle weakness (generalized): Secondary | ICD-10-CM

## 2020-03-01 DIAGNOSIS — R2689 Other abnormalities of gait and mobility: Secondary | ICD-10-CM | POA: Diagnosis not present

## 2020-03-01 NOTE — Therapy (Signed)
[facility] Physical Therapy [address] [place], [place], [address] Phone: [phone]   Fax:  [phone]  Physical Therapy Treatment  Patient Details  Name: Olivia Romero MRN: [id number] Date of Birth: [birth year] Referring Provider (PT): Meridee Score, MD   Encounter Date: 03/01/2020   PT End of Session - 03/01/20 1350    Visit Number 42    Number of Visits 59    Date for PT Re-Evaluation 03/11/20    Authorization Type [employer] Medicare, $30 co-pay, no visit limit    Progress Note Due on Visit 74    PT Start Time 1350    PT Stop Time 1430    PT Time Calculation (min) 40 min    Equipment Utilized During Treatment Gait belt    Activity Tolerance Patient tolerated treatment well    Behavior During Therapy [facility] for tasks assessed/performed           Past Medical History:  Diagnosis Date  . Anemia   . Arthritis   . Asthma   . Colon polyps    adenomatous  . Diabetes mellitus without complication ([place])   . Diabetic infection of left foot ([place]) 12/2018  . Diverticulosis of colon   . Esophagitis   . GERD (gastroesophageal reflux disease)   . Hemorrhoid    internal  . Hyperlipemia   . Hypertension   . IBS (irritable bowel syndrome)    no current prob - diet controlled  . Myalgia due to statin 11/19/2017  . Neuropathy   . Neuropathy of both feet   . Neuropathy of hand   . Pneumonia    x 4  . PONV (postoperative nausea and vomiting)    on some surgeries but not all procedures  . Skin ulcer of right ankle, limited to breakdown of skin ([place]) 12/31/2016   resolved per patient 05/14/19  . Sleep apnea    has had in the past lost 50 pounds and do longer uses cpap  . Statin intolerance 04/23/2015  . Stroke [facility]) 2009   mini stroke per patient  . Stroke-like episode 2009   TIA - mini stroke per patient  . 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 ([place]) 07/13/2015  . Uncontrolled type 2 diabetes mellitus with hyperglycemia ([place]) 11/01/2017     Past Surgical History:  Procedure Laterality Date  . ABDOMINAL HYSTERECTOMY    . AMPUTATION Left 12/06/2018   Procedure: PARTIAL AMPUTATION LEFT FOOT;  Surgeon: Edrick Kins, DPM;  Location: [place];  Service: Podiatry;  Laterality: Left;  . AMPUTATION Left 05/16/2019   Procedure: LEFT BELOW KNEE AMPUTATION;  Surgeon: Newt Minion, MD;  Location: [place];  Service: Orthopedics;  Laterality: Left;  . BACK SURGERY  09/02/2015  . BONE BIOPSY Left 12/06/2018   Procedure: Bone Biopsy;  Surgeon: Edrick Kins, DPM;  Location: [place];  Service: Podiatry;  Laterality: Left;  . CERVICAL FUSION    . CESAREAN SECTION  1986  . COLONOSCOPY N/A 11/30/2012   Procedure: COLONOSCOPY;  Surgeon: Irene Shipper, MD;  Location: [facility] ENDOSCOPY;  Service: Endoscopy;  Laterality: N/A;  . COLONOSCOPY WITH PROPOFOL N/A 01/03/2016   Procedure: COLONOSCOPY WITH PROPOFOL;  Surgeon: Manya Silvas, MD;  Location: [facility]OSCOPY;  Service: Endoscopy;  Laterality: N/A;  . ESOPHAGOGASTRODUODENOSCOPY (EGD) WITH PROPOFOL N/A 01/14/2015   Procedure: ESOPHAGOGASTRODUODENOSCOPY (EGD) WITH PROPOFOL;  Surgeon: Manya Silvas, MD;  Location: [facility] ENDOSCOPY;  Service: Endoscopy;  Laterality: N/A;  . IRRIGATION AND DEBRIDEMENT FOOT Left 03/13/2018  Procedure: IRRIGATION AND DEBRIDEMENT FOOT WITH BONE BIOPSY WITH MISONIX DEBRIDER;  Surgeon: Evelina Bucy, DPM;  Location: [place];  Service: Podiatry;  Laterality: Left;  . IRRIGATION AND DEBRIDEMENT FOOT Left 12/06/2018   Procedure: Irrigation And Debridement Foot;  Surgeon: Edrick Kins, DPM;  Location: [place];  Service: Podiatry;  Laterality: Left;  . LEG AMPUTATION BELOW KNEE Left   . LUMBAR WOUND DEBRIDEMENT N/A 10/01/2015   Procedure: LUMBAR WOUND DEBRIDEMENT;  Surgeon: Ashok Pall, MD;  Location: [place] NEURO ORS;  Service: Neurosurgery;  Laterality: N/A;  LUMBAR WOUND DEBRIDEMENT  . NASAL SINUS SURGERY    . SAVORY DILATION N/A 01/14/2015   Procedure: SAVORY DILATION;  Surgeon: Manya Silvas, MD;  Location: [facility] ENDOSCOPY;  Service: Endoscopy;  Laterality: N/A;  . TONSILLECTOMY    . WISDOM TOOTH EXTRACTION      There were no vitals filed for this visit.   Subjective Assessment - 03/01/20 1350    Subjective She developed a spot on her leg on Saturday that got bigger each day & is very sore.    Pertinent History L TTA, DM, CVA, sleep apnea, PNA, neuropathy bil. 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 No/denies                             [facility] Adult PT Treatment/Exercise - 03/01/20 1350      Bed Mobility   Bed Mobility Right Sidelying to Sit;Sit to Sidelying Right    Right Sidelying to Sit Supervision/Verbal cueing    Right Sidelying to Sit Details (indicate cue type and reason) demo & verbal cues to technique to protect back    Sit to Sidelying Right Supervision/Verbal cueing    Sit to Sidelying Right Details (indicate cue type and reason) demo & verbal cues to technique to protect back      Transfers   Transfers Sit to Stand;Stand to Sit    Sit to Stand 5: Supervision;With upper extremity assist;With armrests;From chair/3-in-1;Other (comment)    Stand to Sit 5: Supervision;With upper extremity assist;With armrests;To chair/3-in-1;Other (comment)      Ambulation/Gait   Ambulation/Gait Yes    Ambulation/Gait Assistance 5: Supervision    Ambulation/Gait Assistance Details cues on upright posture    Ambulation Distance (Feet) 200 Feet   50' X 2 single crutch & 200' with 2 crutches enter/exit   Assistive device Lofstrands;Prosthesis;R Forearm Crutch    Gait Pattern Step-through pattern;Decreased arm swing - left;Decreased step length - right;Decreased stance time - left;Antalgic;Trunk  flexed    Ambulation Surface Level;Indoor    Ramp --    Curb --    Gait Comments --      High Level Balance   High Level Balance Activities --    High Level Balance Comments --      Self-Care   Lifting --      Knee/Hip Exercises: Stretches   Active Hamstring Stretch Right;Left;2 reps;20 seconds    Active Hamstring Stretch Limitations combo: seated with leg extended with looped sheet (less hand strength / arthritic irritation) with gastroc stretch.     Quad Stretch Right;Left;2 reps;20 seconds    Quad Stretch Limitations supine with leg over edge  of mat with sheet tied around ankle      Knee/Hip Exercises: Machines for Strengthening   Cybex Leg Press Shuttle leg press BLEs 100# 10 reps marching with terminal stance 2 sets      Manual Therapy   Manual Therapy Soft tissue mobilization    Soft tissue mobilization deep tissue massage gun to hamstring & soft tissue to stretch to hamstring with muscle spasms.       Prosthetics   Prosthetic Care Comments  sore area on anterior limb appears to be folliculitis from sweating.  Dr. Sharol Given assessed and agrees. Both Dr. Sharol Given & PT recommend use of Vive shrinker under liner folded to just proximal to knee until area heals.  Pt verbalized understanding.      Current prosthetic wear tolerance (days/week)  daily    Current prosthetic wear tolerance (#hours/day)  prosthesis most of awake hours drying 3-4x/day    Residual limb condition  folliculitis on lateral tibia area.      Education Provided Skin check;Residual limb care;Other (comment)   see prosthetic care comments.    Person(s) Educated Patient    Education Method Explanation;Demonstration;Verbal cues    Education Method Verbalized understanding;Verbal cues required                    PT Short Term Goals - 02/12/20 1021      PT SHORT TERM GOAL #1   Title Patient demonstrates and verbalizes understanding of updated HEP. (All STGs Target Date: 02/13/2020)    Baseline MET 02/12/2020      Time 4    Period Weeks    Status Achieved    Target Date 02/13/20      PT SHORT TERM GOAL #2   Title Patient verbalizes proper prosthetic care.    Baseline MET 02/12/2020    Time 4    Period Weeks    Status Achieved    Target Date 02/13/20      PT SHORT TERM GOAL #3   Title Timed Up & Go with single crutch, AFO & prosthesis <25 seconds.    Baseline MET 02/10/2020    Time 4    Period Weeks    Status Revised    Target Date 02/13/20      PT SHORT TERM GOAL #4   Title Patient ambulates 61' around furniture / obstacles with single forearm crutches & prosthesis with supervision.    Baseline MET 02/12/2020    Time 4    Period Weeks    Status Achieved    Target Date 02/13/20             PT Long Term Goals - 01/27/20 1709      PT LONG TERM GOAL #1   Title Patient verbalizes & demonstrates proper prosthetic care and tolerates wear >90% of awake hours without skin issues to enable safe utilization of prosthesis.  (All LTGs Target Date 03/11/2020)    Time 12    Period Weeks    Status On-going    Target Date 03/11/20      PT LONG TERM GOAL #2   Title Tasks of [place] Balance Test with single forearm crutch support >36/56 indicating improved function with standing ADLs    Time 12    Period Weeks    Status On-going    Target Date 03/11/20      PT LONG TERM GOAL #3   Title Patient ambulates 500' with rollator walker or 2 forearm crutches with 50' on grass with  AFO & prosthesis modified independent to enable community mobility.    Time 12    Period Weeks    Status On-going    Target Date 03/11/20      PT LONG TERM GOAL #4   Title Dynamic Gait Index with 2 forearm crutches >/= 14/24    Time 12    Period Weeks    Status On-going    Target Date 03/11/20      PT LONG TERM GOAL #5   Title Patient negotiates ramps & curbs with 2 forearm crutches or rollator walker & prosthesis modified independent for community access.    Time 12    Period Weeks    Status On-going    Target  Date 03/11/20      PT LONG TERM GOAL #6   Title Patient ambulates 25' around furniture carrying item in free UE with single forearm crutch, prosthesis & AFO modified independent.    Time 12    Period Weeks    Status On-going    Target Date 03/11/20      PT LONG TERM GOAL #7   Title Timed Up & Go with single forearm crutch <20 seconds.    Time 12    Period Weeks    Status Revised    Target Date 03/11/20                 Plan - 03/01/20 1354    Clinical Impression Statement Patient developed area on residual limb that appears to be follucilits from sweating and verbalizes understanding of care.  PT instructed pt in quad stretch for home. She developed muscle spasms in right hamstring that PT used manual techniques to relax.    Personal Factors and Comorbidities Comorbidity 3+;Fitness;Time since onset of injury/illness/exacerbation    Comorbidities L TTA, DM, CVA, sleep apnea, PNA, neuropathy bil. feet and hand, HTN, s/p cervical fusion, s/p back surgery, IBS, asthma, Covid in Jan 2021    Examination-Activity Limitations Hygiene/Grooming;Locomotion Level;Reach Overhead;Stairs;Stand;Transfers    Examination-Participation Restrictions Recruitment consultant Evolving/Moderate complexity    Rehab Potential Good    PT Frequency 2x / week    PT Duration 12 weeks    PT Treatment/Interventions ADLs/Self Care Home Management;DME Instruction;Gait training;Stair training;Functional mobility training;Therapeutic activities;Therapeutic exercise;Balance training;Neuromuscular re-education;Patient/family education;Orthotic Fit/Training;Prosthetic Training;Vestibular;Manual techniques    PT Next Visit Plan continue with increasing complexity of gait activities and work on side, back & eyes closed gait,  balance & gait activities, therapuetic exercise, work towards [place] Access Code: [email or web address]    at sink for mini squats with UE  support, weight shifts with UE support, and balance feet apart no Ue support all with chair behind her  9/21 added hamstring / gastroc combo stretch, counter side stepping, backward & eyes closed with left crutch & RUE counter or rail.    Consulted and Agree with Plan of Care Patient           Patient will benefit from skilled therapeutic intervention in order to improve the following deficits and impairments:  Abnormal gait, Decreased activity tolerance, Decreased balance, Decreased endurance, Decreased knowledge of use of DME, Decreased mobility, Decreased range of motion, Decreased safety awareness, Decreased strength, Increased edema, Impaired flexibility, Impaired sensation, Postural dysfunction, Prosthetic Dependency, Obesity, Pain  Visit Diagnosis: Unsteadiness on feet  Other abnormalities of gait and mobility  Muscle weakness (generalized)  Abnormal posture  Stiffness of left knee, not elsewhere classified  History  of fall     Problem List Patient Active Problem List   Diagnosis Date Noted  . Allergy to statin medication 12/01/2019  . History of below knee amputation, left ([place]) 07/30/2019  . Gangrene of left foot ([place]) 05/16/2019  . Subacute osteomyelitis of left foot ([place])   . Charcot's joint of foot, left   . Post-operative state   . PAD (peripheral artery disease) ([place]) 12/05/2018  . Osteomyelitis of ankle or foot, acute, left ([place]) 12/05/2018  . MRSA bacteremia 12/05/2018  . Sepsis without acute organ dysfunction ([place])   . Charcot's joint of foot due to diabetes ([place]) 09/18/2018  . BMI 36.0-36.9,adult   . Morbid obesity ([place]) 11/19/2017  . B12 deficiency 11/01/2017  . Diabetic foot ulcer ([place]) 12/14/2016  . Diabetic polyneuropathy associated with type 2 diabetes mellitus ([place]) 12/13/2015  . Lumbar stenosis with neurogenic claudication 09/02/2015  . Chronic constipation 07/13/2015  . OSA (obstructive sleep apnea) 06/25/2015  . Primary osteoarthritis  involving multiple joints 04/23/2015  . Hypertriglyceridemia 04/23/2015  . Asthma, mild intermittent 02/01/2015  . Type 2 diabetes mellitus with diabetic nephropathy ([place]) 12/31/2014  . Gastroesophageal reflux disease with esophagitis 12/31/2014  . Dysphagia 12/31/2014  . Bilateral carpal tunnel syndrome 12/01/2014  . Diverticulosis of colon (without mention of hemorrhage) 11/30/2012  . Essential hypertension, benign 11/28/2012    Jamey Reas PT, DPT 03/01/2020, 7:27 PM  [facility] Physical Therapy [address] [place], [place], [address] Phone: [phone]   Fax:  [phone]  Name: Olivia Romero MRN: [id number] Date of Birth: Mar 01, 1956

## 2020-03-04 ENCOUNTER — Ambulatory Visit (INDEPENDENT_AMBULATORY_CARE_PROVIDER_SITE_OTHER): Payer: Medicare Other | Admitting: Physical Therapy

## 2020-03-04 ENCOUNTER — Encounter: Payer: Self-pay | Admitting: Physical Therapy

## 2020-03-04 ENCOUNTER — Other Ambulatory Visit: Payer: Self-pay

## 2020-03-04 DIAGNOSIS — R293 Abnormal posture: Secondary | ICD-10-CM | POA: Diagnosis not present

## 2020-03-04 DIAGNOSIS — R2681 Unsteadiness on feet: Secondary | ICD-10-CM | POA: Diagnosis not present

## 2020-03-04 DIAGNOSIS — R2689 Other abnormalities of gait and mobility: Secondary | ICD-10-CM

## 2020-03-04 DIAGNOSIS — M6281 Muscle weakness (generalized): Secondary | ICD-10-CM

## 2020-03-04 DIAGNOSIS — M25662 Stiffness of left knee, not elsewhere classified: Secondary | ICD-10-CM

## 2020-03-04 NOTE — Therapy (Signed)
[facility] [address] [place], [place], [address] Phone: [phone]   Fax:  [phone]  Physical Therapy Treatment  Patient Details  Name: Olivia Romero MRN: [id number] Date of Birth: [birth year] Referring Provider (PT): Meridee Score, MD   Encounter Date: 03/04/2020   PT End of Session - 03/04/20 1030    Visit Number 43    Number of Visits 103    Date for PT Re-Evaluation 03/11/20    Authorization Type [employer] Medicare, $30 co-pay, no visit limit    Progress Note Due on Visit 45    PT Start Time 1024    PT Stop Time 1102    PT Time Calculation (min) 38 min    Equipment Utilized During Treatment Gait belt    Activity Tolerance Patient tolerated treatment well    Behavior During Therapy WFL for tasks assessed/performed           Past Medical History:  Diagnosis Date  . Anemia   . Arthritis   . Asthma   . Colon polyps    adenomatous  . Diabetes mellitus without complication ([place])   . Diabetic infection of left foot ([place]) 12/2018  . Diverticulosis of colon   . Esophagitis   . GERD (gastroesophageal reflux disease)   . Hemorrhoid    internal  . Hyperlipemia   . Hypertension   . IBS (irritable bowel syndrome)    no current prob - diet controlled  . Myalgia due to statin 11/19/2017  . Neuropathy   . Neuropathy of both feet   . Neuropathy of hand   . Pneumonia    x 4  . PONV (postoperative nausea and vomiting)    on some surgeries but not all procedures  . Skin ulcer of right ankle, limited to breakdown of skin ([place]) 12/31/2016   resolved per patient 05/14/19  . Sleep apnea    has had in the past lost 50 pounds and do longer uses cpap  . Statin intolerance 04/23/2015  . Stroke [facility]) 2009   mini stroke per patient  . Stroke-like episode 2009   TIA - mini stroke per patient  . 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 ([place]) 07/13/2015  . Uncontrolled type 2 diabetes mellitus with hyperglycemia ([place]) 11/01/2017     Past Surgical History:  Procedure Laterality Date  . ABDOMINAL HYSTERECTOMY    . AMPUTATION Left 12/06/2018   Procedure: PARTIAL AMPUTATION LEFT FOOT;  Surgeon: Edrick Kins, DPM;  Location: [place];  Service: Podiatry;  Laterality: Left;  . AMPUTATION Left 05/16/2019   Procedure: LEFT BELOW KNEE AMPUTATION;  Surgeon: Newt Minion, MD;  Location: [place];  Service: Orthopedics;  Laterality: Left;  . BACK SURGERY  09/02/2015  . BONE BIOPSY Left 12/06/2018   Procedure: Bone Biopsy;  Surgeon: Edrick Kins, DPM;  Location: [place];  Service: Podiatry;  Laterality: Left;  . CERVICAL FUSION    . CESAREAN SECTION  1986  . COLONOSCOPY N/A 11/30/2012   Procedure: COLONOSCOPY;  Surgeon: Irene Shipper, MD;  Location: [facility] ENDOSCOPY;  Service: Endoscopy;  Laterality: N/A;  . COLONOSCOPY WITH PROPOFOL N/A 01/03/2016   Procedure: COLONOSCOPY WITH PROPOFOL;  Surgeon: Manya Silvas, MD;  Location: [facility] ENDOSCOPY;  Service: Endoscopy;  Laterality: N/A;  . ESOPHAGOGASTRODUODENOSCOPY (EGD) WITH PROPOFOL N/A 01/14/2015   Procedure: ESOPHAGOGASTRODUODENOSCOPY (EGD) WITH PROPOFOL;  Surgeon: Manya Silvas, MD;  Location: [facility] ENDOSCOPY;  Service: Endoscopy;  Laterality: N/A;  . IRRIGATION AND DEBRIDEMENT FOOT Left 03/13/2018  Procedure: IRRIGATION AND DEBRIDEMENT FOOT WITH BONE BIOPSY WITH MISONIX DEBRIDER;  Surgeon: Evelina Bucy, DPM;  Location: [place];  Service: Podiatry;  Laterality: Left;  . IRRIGATION AND DEBRIDEMENT FOOT Left 12/06/2018   Procedure: Irrigation And Debridement Foot;  Surgeon: Edrick Kins, DPM;  Location: [place];  Service: Podiatry;  Laterality: Left;  . LEG AMPUTATION BELOW KNEE Left   . LUMBAR WOUND DEBRIDEMENT N/A 10/01/2015   Procedure: LUMBAR WOUND DEBRIDEMENT;  Surgeon: Ashok Pall, MD;  Location: [place] NEURO ORS;  Service: Neurosurgery;  Laterality: N/A;  LUMBAR WOUND DEBRIDEMENT  . NASAL SINUS SURGERY    . SAVORY DILATION N/A 01/14/2015   Procedure: SAVORY DILATION;  Surgeon: Manya Silvas, MD;  Location: [facility] ENDOSCOPY;  Service: Endoscopy;  Laterality: N/A;  . TONSILLECTOMY    . WISDOM TOOTH EXTRACTION      There were no vitals filed for this visit.   Subjective Assessment - 03/04/20 1024    Subjective She pulled empty trash can to road.  The area on her limb appears to healing using ViveWear shrinker under liner.    Pertinent History L TTA, DM, CVA, sleep apnea, PNA, neuropathy bil. 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 No/denies                             [facility] Adult PT Treatment/Exercise - 03/04/20 1025      Bed Mobility   Bed Mobility Right Sidelying to Sit;Sit to Sidelying Right    Right Sidelying to Sit Supervision/Verbal cueing    Sit to Sidelying Right Supervision/Verbal cueing      Transfers   Transfers Sit to Stand;Stand to Sit    Sit to Stand 5: Supervision;With upper extremity assist;With armrests;From chair/3-in-1;Other (comment)    Stand to Sit 5: Supervision;With upper extremity assist;With armrests;To chair/3-in-1;Other (comment)      Ambulation/Gait   Ambulation/Gait Yes    Ambulation/Gait Assistance 5: Supervision    Ambulation Distance (Feet) 200 Feet   50' X 2 single crutch & 200' with 2 crutches enter/exit   Assistive device Lofstrands;Prosthesis;R Forearm Crutch    Gait Pattern Step-through pattern;Decreased arm swing - left;Decreased step length - right;Decreased stance time - left;Antalgic;Trunk flexed    Curb 6: Modified independent (Device/increase time)   bil. forearm crutches, AFO & TTA prosthesis   Gait Comments carried open cup 3/4 full water without spilling.       Knee/Hip Exercises: Stretches   Active Hamstring Stretch Right;Left;2 reps;20  seconds    Active Hamstring Stretch Limitations combo: seated with leg extended with looped sheet (less hand strength / arthritic irritation) with gastroc stretch.     Contractor Limitations --      Knee/Hip Exercises: Aerobic   Nustep L7 X 8 min BUEs & BLEs       Knee/Hip Exercises: Machines for Strengthening   Cybex Leg Press Shuttle leg press BLEs 100# 10 reps marching with terminal stance 2 sets      Manual Therapy   Manual Therapy --    Soft tissue mobilization --      Prosthetics   Prosthetic Care Comments  area has come  to a head with isolated redness at hair follicle.  PT used tweezer to pluck that hair from wound.  Continue use of shrinker under liner until wound heals.     Current prosthetic wear tolerance (days/week)  daily    Current prosthetic wear tolerance (#hours/day)  prosthesis most of awake hours drying 3-4x/day    Residual limb condition  folliculitis on lateral tibia area.      Education Provided Skin check;Residual limb care;Other (comment)   see prosthetic care comments.    Person(s) Educated Patient    Education Method Explanation;Verbal cues    Education Method Verbalized understanding;Verbal cues required    Donning Prosthesis Modified independent (device/increased time)    Doffing Prosthesis Modified independent (device/increased time)                    PT Short Term Goals - 02/12/20 1021      PT SHORT TERM GOAL #1   Title Patient demonstrates and verbalizes understanding of updated HEP. (All STGs Target Date: 02/13/2020)    Baseline MET 02/12/2020    Time 4    Period Weeks    Status Achieved    Target Date 02/13/20      PT SHORT TERM GOAL #2   Title Patient verbalizes proper prosthetic care.    Baseline MET 02/12/2020    Time 4    Period Weeks    Status Achieved    Target Date 02/13/20      PT SHORT TERM GOAL #3   Title Timed Up & Go with single crutch, AFO & prosthesis <25 seconds.    Baseline MET 02/10/2020     Time 4    Period Weeks    Status Revised    Target Date 02/13/20      PT SHORT TERM GOAL #4   Title Patient ambulates 30' around furniture / obstacles with single forearm crutches & prosthesis with supervision.    Baseline MET 02/12/2020    Time 4    Period Weeks    Status Achieved    Target Date 02/13/20             PT Long Term Goals - 01/27/20 1709      PT LONG TERM GOAL #1   Title Patient verbalizes & demonstrates proper prosthetic care and tolerates wear >90% of awake hours without skin issues to enable safe utilization of prosthesis.  (All LTGs Target Date 03/11/2020)    Time 12    Period Weeks    Status On-going    Target Date 03/11/20      PT LONG TERM GOAL #2   Title Tasks of [place] Balance Test with single forearm crutch support >36/56 indicating improved function with standing ADLs    Time 12    Period Weeks    Status On-going    Target Date 03/11/20      PT LONG TERM GOAL #3   Title Patient ambulates 500' with rollator walker or 2 forearm crutches with 50' on grass with AFO & prosthesis modified independent to enable community mobility.    Time 12    Period Weeks    Status On-going    Target Date 03/11/20      PT LONG TERM GOAL #4   Title Dynamic Gait Index with 2 forearm crutches >/= 14/24    Time 12    Period Weeks    Status On-going    Target Date 03/11/20      PT LONG TERM  GOAL #5   Title Patient negotiates ramps & curbs with 2 forearm crutches or rollator walker & prosthesis modified independent for community access.    Time 12    Period Weeks    Status On-going    Target Date 03/11/20      PT LONG TERM GOAL #6   Title Patient ambulates 52' around furniture carrying item in free UE with single forearm crutch, prosthesis & AFO modified independent.    Time 12    Period Weeks    Status On-going    Target Date 03/11/20      PT LONG TERM GOAL #7   Title Timed Up & Go with single forearm crutch <20 seconds.    Time 12    Period Weeks    Status  Revised    Target Date 03/11/20                 Plan - 03/04/20 1036    Clinical Impression Statement Patient appears on target to meet LTGs next week and discharge PT.  Patient reports that she notices that she can do a lot more activities after training with PT.    Personal Factors and Comorbidities Comorbidity 3+;Fitness;Time since onset of injury/illness/exacerbation    Comorbidities L TTA, DM, CVA, sleep apnea, PNA, neuropathy bil. feet and hand, HTN, s/p cervical fusion, s/p back surgery, IBS, asthma, Covid in Jan 2021    Examination-Activity Limitations Hygiene/Grooming;Locomotion Level;Reach Overhead;Stairs;Stand;Transfers    Examination-Participation Restrictions Recruitment consultant Evolving/Moderate complexity    Rehab Potential Good    PT Frequency 2x / week    PT Duration 12 weeks    PT Treatment/Interventions ADLs/Self Care Home Management;DME Instruction;Gait training;Stair training;Functional mobility training;Therapeutic activities;Therapeutic exercise;Balance training;Neuromuscular re-education;Patient/family education;Orthotic Fit/Training;Prosthetic Training;Vestibular;Manual techniques    PT Next Visit Plan begin to assess LTGs with plans to discharge 2nd visit next week.    PT Home Exercise Plan Access Code: [email or web address]    at sink for mini squats with UE support, weight shifts with UE support, and balance feet apart no Ue support all with chair behind her  9/21 added hamstring / gastroc combo stretch, counter side stepping, backward & eyes closed with left crutch & RUE counter or rail.    Consulted and Agree with Plan of Care Patient           Patient will benefit from skilled therapeutic intervention in order to improve the following deficits and impairments:  Abnormal gait, Decreased activity tolerance, Decreased balance, Decreased endurance, Decreased knowledge of use of DME, Decreased mobility, Decreased range of  motion, Decreased safety awareness, Decreased strength, Increased edema, Impaired flexibility, Impaired sensation, Postural dysfunction, Prosthetic Dependency, Obesity, Pain  Visit Diagnosis: Unsteadiness on feet  Other abnormalities of gait and mobility  Muscle weakness (generalized)  Abnormal posture  Stiffness of left knee, not elsewhere classified     Problem List Patient Active Problem List   Diagnosis Date Noted  . Allergy to statin medication 12/01/2019  . History of below knee amputation, left ([place]) 07/30/2019  . Gangrene of left foot ([place]) 05/16/2019  . Subacute osteomyelitis of left foot ([place])   . Charcot's joint of foot, left   . Post-operative state   . PAD (peripheral artery disease) ([place]) 12/05/2018  . Osteomyelitis of ankle or foot, acute, left ([place]) 12/05/2018  . MRSA bacteremia 12/05/2018  . Sepsis without acute organ dysfunction ([place])   . Charcot's joint of foot due to diabetes ([place]) 09/18/2018  .  BMI 36.0-36.9,adult   . Morbid obesity ([place]) 11/19/2017  . B12 deficiency 11/01/2017  . Diabetic foot ulcer ([place]) 12/14/2016  . Diabetic polyneuropathy associated with type 2 diabetes mellitus ([place]) 12/13/2015  . Lumbar stenosis with neurogenic claudication 09/02/2015  . Chronic constipation 07/13/2015  . OSA (obstructive sleep apnea) 06/25/2015  . 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/23/2015  . Hypertriglyceridemia 04/23/2015  . Asthma, mild intermittent 02/01/2015  . Type 2 diabetes mellitus with diabetic nephropathy ([place]) 12/31/2014  . Gastroesophageal reflux disease with esophagitis 12/31/2014  . Dysphagia 12/31/2014  . Bilateral carpal tunnel syndrome 12/01/2014  . Diverticulosis of colon (without mention of hemorrhage) 11/30/2012  . Essential hypertension, benign 11/28/2012    Jamey Reas PT, DPT 03/04/2020, 11:10 AM  [facility] Physical [facility] [address] [place], [place], [address] Phone: [phone]    Fax:  [phone]  Name: PRACHI OFTEDAHL MRN: [id number] Date of Birth: June 27, 1955

## 2020-03-08 ENCOUNTER — Ambulatory Visit (INDEPENDENT_AMBULATORY_CARE_PROVIDER_SITE_OTHER): Payer: Medicare Other | Admitting: Physical Therapy

## 2020-03-08 ENCOUNTER — Other Ambulatory Visit: Payer: Self-pay

## 2020-03-08 ENCOUNTER — Encounter: Payer: Self-pay | Admitting: Physical Therapy

## 2020-03-08 DIAGNOSIS — R2681 Unsteadiness on feet: Secondary | ICD-10-CM | POA: Diagnosis not present

## 2020-03-08 DIAGNOSIS — R2689 Other abnormalities of gait and mobility: Secondary | ICD-10-CM | POA: Diagnosis not present

## 2020-03-08 DIAGNOSIS — R293 Abnormal posture: Secondary | ICD-10-CM

## 2020-03-08 DIAGNOSIS — M6281 Muscle weakness (generalized): Secondary | ICD-10-CM | POA: Diagnosis not present

## 2020-03-08 DIAGNOSIS — M25662 Stiffness of left knee, not elsewhere classified: Secondary | ICD-10-CM | POA: Diagnosis not present

## 2020-03-08 NOTE — Therapy (Signed)
[facility] [address] [place], [place], [address] Phone: [phone]   Fax:  [phone]  Physical Therapy Treatment  Patient Details  Name: Olivia Romero MRN: [id number] Date of Birth: 28-Feb-1956 Referring Provider (PT): Meridee Score, MD   Encounter Date: 03/08/2020   PT End of Session - 03/08/20 1124    Visit Number 68    Number of Visits 21    Date for PT Re-Evaluation 03/11/20    Authorization Type [employer] Medicare, $30 co-pay, no visit limit    Progress Note Due on Visit 78    PT Start Time 1125    PT Stop Time 1220    PT Time Calculation (min) 55 min    Equipment Utilized During Treatment Gait belt    Activity Tolerance Patient tolerated treatment well    Behavior During Therapy WFL for tasks assessed/performed           Past Medical History:  Diagnosis Date  . Anemia   . Arthritis   . Asthma   . Colon polyps    adenomatous  . Diabetes mellitus without complication ([place])   . Diabetic infection of left foot ([place]) 12/2018  . Diverticulosis of colon   . Esophagitis   . GERD (gastroesophageal reflux disease)   . Hemorrhoid    internal  . Hyperlipemia   . Hypertension   . IBS (irritable bowel syndrome)    no current prob - diet controlled  . Myalgia due to statin 11/19/2017  . Neuropathy   . Neuropathy of both feet   . Neuropathy of hand   . Pneumonia    x 4  . PONV (postoperative nausea and vomiting)    on some surgeries but not all procedures  . Skin ulcer of right ankle, limited to breakdown of skin ([place]) 12/31/2016   resolved per patient 05/14/19  . Sleep apnea    has had in the past lost 50 pounds and do longer uses cpap  . Statin intolerance 04/23/2015  . Stroke [facility]) 2009   mini stroke per patient  . Stroke-like episode 2009   TIA - mini stroke per patient  . 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 Uncontrolled type 2 diabetes mellitus with hyperglycemia ([place]) 11/01/2017     Past Surgical History:  Procedure Laterality Date  . ABDOMINAL HYSTERECTOMY    . AMPUTATION Left 12/06/2018   Procedure: PARTIAL AMPUTATION LEFT FOOT;  Surgeon: Edrick Kins, DPM;  Location: [place];  Service: Podiatry;  Laterality: Left;  . AMPUTATION Left 05/16/2019   Procedure: LEFT BELOW KNEE AMPUTATION;  Surgeon: Newt Minion, MD;  Location: [place];  Service: Orthopedics;  Laterality: Left;  . BACK SURGERY  09/02/2015  . BONE BIOPSY Left 12/06/2018   Procedure: Bone Biopsy;  Surgeon: Edrick Kins, DPM;  Location: [place];  Service: Podiatry;  Laterality: Left;  . CERVICAL FUSION    . CESAREAN SECTION  1986  . COLONOSCOPY N/A 11/30/2012   Procedure: COLONOSCOPY;  Surgeon: Irene Shipper, MD;  Location: [facility] ENDOSCOPY;  Service: Endoscopy;  Laterality: N/A;  . COLONOSCOPY WITH PROPOFOL N/A 01/03/2016   Procedure: COLONOSCOPY WITH PROPOFOL;  Surgeon: Manya Silvas, MD;  Location: [facility]OSCOPY;  Service: Endoscopy;  Laterality: N/A;  . ESOPHAGOGASTRODUODENOSCOPY (EGD) WITH PROPOFOL N/A 01/14/2015   Procedure: ESOPHAGOGASTRODUODENOSCOPY (EGD) WITH PROPOFOL;  Surgeon: Manya Silvas, MD;  Location: [facility] ENDOSCOPY;  Service: Endoscopy;  Laterality: N/A;  . IRRIGATION AND DEBRIDEMENT FOOT Left 03/13/2018  Procedure: IRRIGATION AND DEBRIDEMENT FOOT WITH BONE BIOPSY WITH MISONIX DEBRIDER;  Surgeon: Evelina Bucy, DPM;  Location: [place];  Service: Podiatry;  Laterality: Left;  . IRRIGATION AND DEBRIDEMENT FOOT Left 12/06/2018   Procedure: Irrigation And Debridement Foot;  Surgeon: Edrick Kins, DPM;  Location: [place];  Service: Podiatry;  Laterality: Left;  . LEG AMPUTATION BELOW KNEE Left   . LUMBAR WOUND DEBRIDEMENT N/A 10/01/2015   Procedure: LUMBAR WOUND DEBRIDEMENT;  Surgeon: Ashok Pall, MD;  Location: [place] NEURO ORS;  Service: Neurosurgery;  Laterality: N/A;  LUMBAR WOUND DEBRIDEMENT  . NASAL SINUS SURGERY    . SAVORY DILATION N/A 01/14/2015   Procedure: SAVORY DILATION;  Surgeon: Manya Silvas, MD;  Location: [facility] ENDOSCOPY;  Service: Endoscopy;  Laterality: N/A;  . TONSILLECTOMY    . WISDOM TOOTH EXTRACTION      There were no vitals filed for this visit.   Subjective Assessment - 03/08/20 1124    Subjective She has been using Vive shrinker under liner & that area is almost healed but 2nd small area started.    Pertinent History L TTA, DM, CVA, sleep apnea, PNA, neuropathy bil. 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 No/denies              [facility] PT Assessment - 03/08/20 1125      Assessment   Medical Diagnosis Left Transtibial Amputation    Referring Provider (PT) Meridee Score, MD      Ambulation/Gait   Gait velocity 1.80 ft/sec with 2 crutches      Standardized Balance Assessment   Standardized Balance Assessment Berg Balance Test;Timed Up and Go Test;Dynamic Gait Index      Berg Balance Test   Sit to Stand Able to stand  independently using hands   using single cruch support    Standing Unsupported Able to stand safely 2 minutes   using single cruch support    Sitting with Back Unsupported but Feet Supported on Floor or Stool Able to sit safely and securely 2 minutes    Stand to Sit Sits safely with minimal use of hands   using single cruch support    Transfers Able to transfer safely, minor use of hands    Standing Unsupported with Eyes Closed Able to stand 10 seconds with supervision   using single cruch support    Standing Unsupported with Feet Together Needs help to attain position but able to stand for 30 seconds with feet together   using single cruch support    From Standing, Reach Forward with Outstretched Arm Can reach forward >12 cm safely (5")   using single cruch support    From  Standing Position, Pick up Object from Floor Able to pick up shoe, needs supervision   using single cruch support    From Standing Position, Turn to Look Behind Over each Shoulder Turn sideways only but maintains balance   using single cruch support    Turn 360 Degrees Able to turn 360 degrees safely but slowly   using single cruch support    Standing Unsupported, Alternately Place Feet on Step/Stool Able to complete >2 steps/needs minimal assist   using single cruch support    Standing Unsupported, One Foot  in [place] to take small step independently and hold 30 seconds   using single cruch support    Standing on One Leg Tries to lift leg/unable to hold 3 seconds but remains standing independently   using single cruch support    Total Score 37    Berg comment: on 7/15 Berg w/single crutch was 21/56      Dynamic Gait Index   Level Surface Mild Impairment    Change in Gait Speed Moderate Impairment    Gait with Horizontal Head Turns Mild Impairment    Gait with Vertical Head Turns Mild Impairment    Gait and Pivot Turn Mild Impairment    Step Over Obstacle Mild Impairment    Step Around Obstacles Mild Impairment    Steps Moderate Impairment    Total Score 14    DGI comment: DGI performed with bilateral forearm crutches      Timed Up and Go Test   Normal TUG (seconds) 15.53   single forearmc crutch                        [facility] Adult PT Treatment/Exercise - 03/08/20 1125      Bed Mobility   Bed Mobility --    Right Sidelying to Sit --    Sit to Sidelying Right --      Transfers   Transfers Sit to Stand;Stand to Sit    Sit to Stand 5: Supervision;With upper extremity assist;With armrests;From chair/3-in-1;Other (comment)    Stand to Sit 5: Supervision;With upper extremity assist;With armrests;To chair/3-in-1;Other (comment)      Ambulation/Gait   Ambulation/Gait Yes    Ambulation/Gait Assistance --    Ambulation Distance (Feet) --    Assistive device  Lofstrands;Prosthesis;R Forearm Crutch    Gait Pattern Step-through pattern;Decreased arm swing - left;Decreased step length - right;Decreased stance time - left;Antalgic;Trunk flexed    Stairs Yes    Stairs Assistance 6: Modified independent (Device/Increase time)    Stair Management Technique One rail Right;One rail Left;With crutches;Step to pattern;Forwards    Curb --    Gait Comments --      Knee/Hip Exercises: Stretches   Active Hamstring Stretch Right;Left;2 reps;20 seconds    Active Hamstring Stretch Limitations combo: seated with leg extended with looped sheet (less hand strength / arthritic irritation) with gastroc stretch.       Knee/Hip Exercises: Aerobic   Nustep L6 X 5 min BUEs & BLEs       Knee/Hip Exercises: Machines for Strengthening   Cybex Leg Press Shuttle leg press BLEs 100# 10 reps marching with terminal stance 2 sets      Prosthetics   Prosthetic Care Comments  --    Current prosthetic wear tolerance (days/week)  daily    Current prosthetic wear tolerance (#hours/day)  prosthesis most of awake hours drying 3-4x/day    Residual limb condition  2 small redness with folliculitis on lateral tibia area    Education Provided Skin check;Residual limb care    Person(s) Educated Patient    Education Method Explanation;Verbal cues    Education Method Verbalized understanding;Verbal cues required                    PT Short Term Goals - 02/12/20 1021      PT SHORT TERM GOAL #1   Title Patient demonstrates and verbalizes understanding of updated HEP. (All STGs Target Date: 02/13/2020)    Baseline MET 02/12/2020  Time 4    Period Weeks    Status Achieved    Target Date 02/13/20      PT SHORT TERM GOAL #2   Title Patient verbalizes proper prosthetic care.    Baseline MET 02/12/2020    Time 4    Period Weeks    Status Achieved    Target Date 02/13/20      PT SHORT TERM GOAL #3   Title Timed Up & Go with single crutch, AFO & prosthesis <25 seconds.     Baseline MET 02/10/2020    Time 4    Period Weeks    Status Revised    Target Date 02/13/20      PT SHORT TERM GOAL #4   Title Patient ambulates 42' around furniture / obstacles with single forearm crutches & prosthesis with supervision.    Baseline MET 02/12/2020    Time 4    Period Weeks    Status Achieved    Target Date 02/13/20             PT Long Term Goals - 03/08/20 1226      PT LONG TERM GOAL #1   Title Patient verbalizes & demonstrates proper prosthetic care and tolerates wear >90% of awake hours without skin issues to enable safe utilization of prosthesis.  (All LTGs Target Date 03/11/2020)    Time 12    Period Weeks    Status On-going      PT LONG TERM GOAL #2   Title Tasks of [employer] with single forearm crutch support >36/56 indicating improved function with standing ADLs    Baseline MET 03/08/2020 Merrilee Jansky Task with single crutch 37/56    Time 12    Period Weeks    Status Achieved      PT LONG TERM GOAL #3   Title Patient ambulates 500' with rollator walker or 2 forearm crutches with 50' on grass with AFO & prosthesis modified independent to enable community mobility.    Time 12    Period Weeks    Status On-going      PT LONG TERM GOAL #4   Title Dynamic Gait Index with 2 forearm crutches >/= 14/24    Baseline MET 03/08/2020 DGI w/2 crutches 14/24    Time 12    Period Weeks    Status Achieved      PT LONG TERM GOAL #5   Title Patient negotiates ramps & curbs with 2 forearm crutches or rollator walker & prosthesis modified independent for community access.    Time 12    Period Weeks    Status On-going      PT LONG TERM GOAL #6   Title Patient ambulates 20' around furniture carrying item in free UE with single forearm crutch, prosthesis & AFO modified independent.    Time 12    Period Weeks    Status On-going      PT LONG TERM GOAL #7   Title Timed Up & Go with single forearm crutch <20 seconds.    Baseline MET 03/08/2020 TUG with single crutch  15.53 seconds    Time 12    Period Weeks    Status Achieved                 Plan - 03/08/20 1124    Clinical Impression Statement Patient improved scores & met LTGs of Berg with single crutch improved to 37/56, TUG with single crutch 15.53sec and Dynamic Gait Index with 2 forearm  crutches 14/24. This indicates higher level of function & lower fall risk.    Personal Factors and Comorbidities Comorbidity 3+;Fitness;Time since onset of injury/illness/exacerbation    Comorbidities L TTA, DM, CVA, sleep apnea, PNA, neuropathy bil. feet and hand, HTN, s/p cervical fusion, s/p back surgery, IBS, asthma, Covid in Jan 2021    Examination-Activity Limitations Hygiene/Grooming;Locomotion Level;Reach Overhead;Stairs;Stand;Transfers    Examination-Participation Restrictions Recruitment consultant Evolving/Moderate complexity    Rehab Potential Good    PT Frequency 2x / week    PT Duration 12 weeks    PT Treatment/Interventions ADLs/Self Care Home Management;DME Instruction;Gait training;Stair training;Functional mobility training;Therapeutic activities;Therapeutic exercise;Balance training;Neuromuscular re-education;Patient/family education;Orthotic Fit/Training;Prosthetic Training;Vestibular;Manual techniques    PT Next Visit Plan assess remaining LTGs & discharge    PT Home Exercise Plan Access Code: [email or web address]    at sink for mini squats with UE support, weight shifts with UE support, and balance feet apart no Ue support all with chair behind her  9/21 added hamstring / gastroc combo stretch, counter side stepping, backward & eyes closed with left crutch & RUE counter or rail.    Consulted and Agree with Plan of Care Patient           Patient will benefit from skilled therapeutic intervention in order to improve the following deficits and impairments:  Abnormal gait, Decreased activity tolerance, Decreased balance, Decreased endurance, Decreased  knowledge of use of DME, Decreased mobility, Decreased range of motion, Decreased safety awareness, Decreased strength, Increased edema, Impaired flexibility, Impaired sensation, Postural dysfunction, Prosthetic Dependency, Obesity, Pain  Visit Diagnosis: Unsteadiness on feet  Other abnormalities of gait and mobility  Muscle weakness (generalized)  Stiffness of left knee, not elsewhere classified  Abnormal posture     Problem List Patient Active Problem List   Diagnosis Date Noted  . Allergy to statin medication 12/01/2019  . History of below knee amputation, left ([place]) 07/30/2019  . Gangrene of left foot ([place]) 05/16/2019  . Subacute osteomyelitis of left foot ([place])   . Charcot's joint of foot, left   . Post-operative state   . PAD (peripheral artery disease) ([place]) 12/05/2018  . Osteomyelitis of ankle or foot, acute, left ([place]) 12/05/2018  . MRSA bacteremia 12/05/2018  . Sepsis without acute organ dysfunction ([place])   . Charcot's joint of foot due to diabetes ([facility]) 09/18/2018  . BMI 36.0-36.9,adult   . Morbid obesity ([place]) 11/19/2017  . B12 deficiency 11/01/2017  . Diabetic foot ulcer ([place]) 12/14/2016  . Diabetic polyneuropathy associated with type 2 diabetes mellitus ([place]) 12/13/2015  . Lumbar stenosis with neurogenic claudication 09/02/2015  . Chronic constipation 07/13/2015  . OSA (obstructive sleep apnea) 06/25/2015  . 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/23/2015  . Hypertriglyceridemia 04/23/2015  . Asthma, mild intermittent 02/01/2015  . Type 2 diabetes mellitus with diabetic nephropathy ([place]) 12/31/2014  . Gastroesophageal reflux disease with esophagitis 12/31/2014  . Dysphagia 12/31/2014  . Bilateral carpal tunnel syndrome 12/01/2014  . Diverticulosis of colon (without mention of hemorrhage) 11/30/2012  . Essential hypertension, benign 11/28/2012    Jamey Reas PT, DPT 03/08/2020, 12:32 PM  [facility] [address] [place], [place], [address] Phone: [phone]   Fax:  [phone]  Name: ELDORIS BEISER MRN: [id number] Date of Birth: April 26, 1956

## 2020-03-10 ENCOUNTER — Ambulatory Visit (INDEPENDENT_AMBULATORY_CARE_PROVIDER_SITE_OTHER): Payer: Medicare Other | Admitting: Physical Therapy

## 2020-03-10 ENCOUNTER — Other Ambulatory Visit: Payer: Self-pay

## 2020-03-10 ENCOUNTER — Encounter: Payer: Self-pay | Admitting: Physical Therapy

## 2020-03-10 DIAGNOSIS — R293 Abnormal posture: Secondary | ICD-10-CM

## 2020-03-10 DIAGNOSIS — M25662 Stiffness of left knee, not elsewhere classified: Secondary | ICD-10-CM

## 2020-03-10 DIAGNOSIS — M6281 Muscle weakness (generalized): Secondary | ICD-10-CM

## 2020-03-10 DIAGNOSIS — R2689 Other abnormalities of gait and mobility: Secondary | ICD-10-CM | POA: Diagnosis not present

## 2020-03-10 NOTE — Therapy (Signed)
[facility] Physical Therapy [address] [place], [place], [address] Phone: [phone]   Fax:  [phone]  Physical Therapy Treatment & Discharge Summary  Patient Details  Name: Olivia Romero MRN: [id number] Date of Birth: Mar 04, 1956 Referring Provider (PT): Meridee Score, MD   Encounter Date: 03/10/2020   PHYSICAL THERAPY DISCHARGE SUMMARY  Visits from Start of Care: 45  Current functional level related to goals / functional outcomes: See below   Remaining deficits: See below   Education / Equipment: Prosthetic care & HEP  Plan: Patient agrees to discharge.  Patient goals were met. Patient is being discharged due to meeting the stated rehab goals.  ?????            PT End of Session - 03/10/20 1025    Visit Number 45    Number of Visits 50    Date for PT Re-Evaluation 03/11/20    Authorization Type U[employer] Medicare, $30 co-pay, no visit limit    Progress Note Due on Visit 33    PT Start Time 1018    PT Stop Time 1056    PT Time Calculation (min) 38 min    Equipment Utilized During Treatment Gait belt    Activity Tolerance Patient tolerated treatment well    Behavior During Therapy WFL for tasks assessed/performed           Past Medical History:  Diagnosis Date   Anemia    Arthritis    Asthma    Colon polyps    adenomatous   Diabetes mellitus without complication ([place])    Diabetic infection of left foot ([place]) 12/2018   Diverticulosis of colon    Esophagitis    GERD (gastroesophageal reflux disease)    Hemorrhoid    internal   Hyperlipemia    Hypertension    IBS (irritable bowel syndrome)    no current prob - diet controlled   Myalgia due to statin 11/19/2017   Neuropathy    Neuropathy of both feet    Neuropathy of hand    Pneumonia    x 4   PONV (postoperative nausea and vomiting)    on some surgeries but not all procedures   Skin ulcer of right ankle, limited to breakdown of skin ([place]) 12/31/2016    resolved per patient 05/14/19   Sleep apnea    has had in the past lost 50 pounds and do longer uses cpap   Statin intolerance 04/23/2015   Stroke ([place]) 2009   mini stroke per patient   Stroke-like episode 2009   TIA - mini stroke per patient   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 ([place]) 07/13/2015   Uncontrolled type 2 diabetes mellitus with hyperglycemia ([place]) 11/01/2017    Past Surgical History:  Procedure Laterality Date   ABDOMINAL HYSTERECTOMY     AMPUTATION Left 12/06/2018   Procedure: PARTIAL AMPUTATION LEFT FOOT;  Surgeon: Edrick Kins, DPM;  Location: [place];  Service: Podiatry;  Laterality: Left;   AMPUTATION Left 05/16/2019   Procedure: LEFT BELOW KNEE AMPUTATION;  Surgeon: Newt Minion, MD;  Location: [place];  Service: Orthopedics;  Laterality: Left;   BACK SURGERY  09/02/2015   BONE BIOPSY Left 12/06/2018   Procedure: Bone Biopsy;  Surgeon: Edrick Kins, DPM;  Location: [place];  Service: Podiatry;  Laterality: Left;   [place]   COLONOSCOPY N/A 11/30/2012   Procedure: COLONOSCOPY;  Surgeon: Irene Shipper, MD;  Location:  [facility] ENDOSCOPY;  Service: Endoscopy;  Laterality: N/A;   COLONOSCOPY WITH PROPOFOL N/A 01/03/2016   Procedure: COLONOSCOPY WITH PROPOFOL;  Surgeon: Manya Silvas, MD;  Location: [facility] ENDOSCOPY;  Service: Endoscopy;  Laterality: N/A;   ESOPHAGOGASTRODUODENOSCOPY (EGD) WITH PROPOFOL N/A 01/14/2015   Procedure: ESOPHAGOGASTRODUODENOSCOPY (EGD) WITH PROPOFOL;  Surgeon: Manya Silvas, MD;  Location: [facility] ENDOSCOPY;  Service: Endoscopy;  Laterality: N/A;   IRRIGATION AND DEBRIDEMENT FOOT Left 03/13/2018   Procedure: IRRIGATION AND DEBRIDEMENT FOOT WITH BONE BIOPSY WITH MISONIX DEBRIDER;  Surgeon: Evelina Bucy, DPM;  Location: [place];  Service: Podiatry;  Laterality: Left;   IRRIGATION AND DEBRIDEMENT FOOT Left 12/06/2018   Procedure: Irrigation And Debridement Foot;  Surgeon: Edrick Kins,  DPM;  Location: [place];  Service: Podiatry;  Laterality: Left;   LEG AMPUTATION BELOW KNEE Left    LUMBAR WOUND DEBRIDEMENT N/A 10/01/2015   Procedure: LUMBAR WOUND DEBRIDEMENT;  Surgeon: Ashok Pall, MD;  Location: [place] NEURO ORS;  Service: Neurosurgery;  Laterality: N/A;  LUMBAR WOUND DEBRIDEMENT   NASAL SINUS SURGERY     SAVORY DILATION N/A 01/14/2015   Procedure: SAVORY DILATION;  Surgeon: Manya Silvas, MD;  Location: [facility] ENDOSCOPY;  Service: Endoscopy;  Laterality: N/A;   TONSILLECTOMY     WISDOM TOOTH EXTRACTION      There were no vitals filed for this visit.   Subjective Assessment - 03/10/20 1026    Subjective No new issues. She feels that she has improved a great deal through PT.    Pertinent History L TTA, DM, CVA, sleep apnea, PNA, neuropathy bil. 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 No/denies               Prosthetics Assessment - 03/10/20 1018      Prosthetics   Prosthetic Care Independent with Skin check;Residual limb care;Care of non-amputated limb;Prosthetic cleaning;Correct ply sock adjustment;Proper wear schedule/adjustment;Proper weight-bearing schedule/adjustment    Donning prosthesis  Modified independent (Device/Increase time)    Doffing prosthesis  Modified independent (Device/Increase time)    Current prosthetic weight-bearing tolerance (hours/day)  patient tolerated intermittent standing 30 minutes with UE support without c/o limb pain or tenderness    Residual limb condition  --                        [facility] Adult PT Treatment/Exercise - 03/10/20 1018      Transfers   Transfers Sit to Stand;Stand to Sit    Sit to Stand 6: Modified independent (Device/Increase time);With  upper extremity assist;From chair/3-in-1;Other (comment)   to single crutch   Stand to Sit 6: Modified independent (Device/Increase time);With upper extremity assist;To chair/3-in-1;Other (comment)   from single crutch     Ambulation/Gait   Ambulation/Gait Yes    Ambulation/Gait Assistance 6: Modified independent (Device/Increase time)    Ambulation Distance (Feet) 500 Feet   100' on grass   Assistive device Lofstrands;Prosthesis;R Forearm Crutch;Other (Comment)   AFO RLE,  single crutch household & 2 crutches community   Gait Pattern Step-through pattern;Antalgic;Trunk flexed    Ambulation Surface Level;Indoor;Outdoor;Paved;Grass;Unlevel    Gait velocity 1.82 ft/sec with 1 crutch    Stairs Yes    Stairs Assistance 6: Modified independent (Device/Increase time)  Stair Management Technique One rail Right;One rail Left;With crutches;Step to pattern;Forwards    Ramp 6: Modified independent (Device)   2 crutches, prosthesis & AFO   Curb 6: Modified independent (Device/increase time)   2 crutches, prosthesis & AFO   Gait Comments carried open cup 3/4 full water without spilling.       Knee/Hip Exercises: Stretches   Active Hamstring Stretch --    Active Hamstring Stretch Limitations --      Knee/Hip Exercises: Aerobic   Nustep L6 X 8 min BUEs & BLEs       Knee/Hip Exercises: Machines for Strengthening   Cybex Leg Press --      Prosthetics   Current prosthetic wear tolerance (days/week)  daily    Current prosthetic wear tolerance (#hours/day)  prosthesis most of awake hours drying 3-4x/day    Education Provided Skin check;Residual limb care                    PT Short Term Goals - 02/12/20 1021      PT SHORT TERM GOAL #1   Title Patient demonstrates and verbalizes understanding of updated HEP. (All STGs Target Date: 02/13/2020)    Baseline MET 02/12/2020    Time 4    Period Weeks    Status Achieved    Target Date 02/13/20      PT SHORT TERM GOAL #2   Title Patient  verbalizes proper prosthetic care.    Baseline MET 02/12/2020    Time 4    Period Weeks    Status Achieved    Target Date 02/13/20      PT SHORT TERM GOAL #3   Title Timed Up & Go with single crutch, AFO & prosthesis <25 seconds.    Baseline MET 02/10/2020    Time 4    Period Weeks    Status Revised    Target Date 02/13/20      PT SHORT TERM GOAL #4   Title Patient ambulates 11' around furniture / obstacles with single forearm crutches & prosthesis with supervision.    Baseline MET 02/12/2020    Time 4    Period Weeks    Status Achieved    Target Date 02/13/20             PT Long Term Goals - 03/10/20 1058      PT LONG TERM GOAL #1   Title Patient verbalizes & demonstrates proper prosthetic care and tolerates wear >90% of awake hours without skin issues to enable safe utilization of prosthesis.  (All LTGs Target Date 03/11/2020)    Baseline MET 03/10/2020    Time 12    Period Weeks    Status Achieved      PT LONG TERM GOAL #2   Title Tasks of [employer] Test with single forearm crutch support >36/56 indicating improved function with standing ADLs    Baseline MET 03/08/2020 Merrilee Jansky Task with single crutch 37/56    Time 12    Period Weeks    Status Achieved      PT LONG TERM GOAL #3   Title Patient ambulates 500' with rollator walker or 2 forearm crutches with 50' on grass with AFO & prosthesis modified independent to enable community mobility.    Baseline MET 03/10/2020    Time 12    Period Weeks    Status Achieved      PT LONG TERM GOAL #4   Title Dynamic Gait Index with 2 forearm crutches >/=  14/24    Baseline MET 03/08/2020 DGI w/2 crutches 14/24    Time 12    Period Weeks    Status Achieved      PT LONG TERM GOAL #5   Title Patient negotiates ramps & curbs with 2 forearm crutches or rollator walker & prosthesis modified independent for community access.    Baseline MET 03/10/2020    Time 12    Period Weeks    Status Achieved      PT LONG TERM GOAL #6   Title  Patient ambulates 91' around furniture carrying item in free UE with single forearm crutch, prosthesis & AFO modified independent.    Baseline MET 03/10/2020    Time 12    Period Weeks    Status Achieved      PT LONG TERM GOAL #7   Title Timed Up & Go with single forearm crutch <20 seconds.    Baseline MET 03/08/2020 TUG with single crutch 15.53 seconds    Time 12    Period Weeks    Status Achieved                 Plan - 03/10/20 1025    Clinical Impression Statement Patient met all LTGs. She appears to be safely functioning with her prosthesis with single crutch for household activities and 2 forearm crutches or rollator walker for community activities.  She is wearing her prosthesis most of her awake hours and understands prosthetic care.    Personal Factors and Comorbidities Comorbidity 3+;Fitness;Time since onset of injury/illness/exacerbation    Comorbidities L TTA, DM, CVA, sleep apnea, PNA, neuropathy bil. feet and hand, HTN, s/p cervical fusion, s/p back surgery, IBS, asthma, Covid in Jan 2021    Examination-Activity Limitations Hygiene/Grooming;Locomotion Level;Reach Overhead;Stairs;Stand;Transfers    Examination-Participation Restrictions Recruitment consultant Evolving/Moderate complexity    Rehab Potential Good    PT Frequency 2x / week    PT Duration 12 weeks    PT Treatment/Interventions ADLs/Self Care Home Management;DME Instruction;Gait training;Stair training;Functional mobility training;Therapeutic activities;Therapeutic exercise;Balance training;Neuromuscular re-education;Patient/family education;Orthotic Fit/Training;Prosthetic Training;Vestibular;Manual techniques    PT Next Visit Plan discharge    PT Home Exercise Plan Access Code: [email or web address]    at sink for mini squats with UE support, weight shifts with UE support, and balance feet apart no Ue support all with chair behind her  9/21 added hamstring / gastroc combo  stretch, counter side stepping, backward & eyes closed with left crutch & RUE counter or rail.    Consulted and Agree with Plan of Care Patient           Patient will benefit from skilled therapeutic intervention in order to improve the following deficits and impairments:  Abnormal gait, Decreased activity tolerance, Decreased balance, Decreased endurance, Decreased knowledge of use of DME, Decreased mobility, Decreased range of motion, Decreased safety awareness, Decreased strength, Increased edema, Impaired flexibility, Impaired sensation, Postural dysfunction, Prosthetic Dependency, Obesity, Pain  Visit Diagnosis: Other abnormalities of gait and mobility  Muscle weakness (generalized)  Stiffness of left knee, not elsewhere classified  Abnormal posture     Problem List Patient Active Problem List   Diagnosis Date Noted   Allergy to statin medication 12/01/2019   History of below knee amputation, left ([place]) 07/30/2019   Gangrene of left foot ([place]) 05/16/2019   Subacute osteomyelitis of left foot ([place])    Charcot's joint of foot, left    Post-operative state    PAD (peripheral artery disease) ([place])  12/05/2018   Osteomyelitis of ankle or foot, acute, left ([place]) 12/05/2018   MRSA bacteremia 12/05/2018   Sepsis without acute organ dysfunction ([place])    Charcot's joint of foot due to diabetes ([place]) 09/18/2018   BMI 36.0-36.9,adult    Morbid obesity ([place]) 11/19/2017   B12 deficiency 11/01/2017   Diabetic foot ulcer ([place]) 12/14/2016   Diabetic polyneuropathy associated with type 2 diabetes mellitus ([place]) 12/13/2015   Lumbar stenosis with neurogenic claudication 09/02/2015   Chronic constipation 07/13/2015   OSA (obstructive sleep apnea) 06/25/2015   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/23/2015   Hypertriglyceridemia 04/23/2015   Asthma, mild intermittent 02/01/2015   Type 2 diabetes mellitus with diabetic nephropathy ([place]) 12/31/2014    Gastroesophageal reflux disease with esophagitis 12/31/2014   Dysphagia 12/31/2014   Bilateral carpal tunnel syndrome 12/01/2014   Diverticulosis of colon (without mention of hemorrhage) 11/30/2012   Essential hypertension, benign 11/28/2012    Jamey Reas PT, DPT 03/10/2020, 12:51 PM  [facility] Physical Therapy [address] [place], [place], [address] Phone: [phone]   Fax:  [phone]  Name: Olivia Romero MRN: [id number] Date of Birth: 1956-02-19

## 2020-03-15 ENCOUNTER — Ambulatory Visit (INDEPENDENT_AMBULATORY_CARE_PROVIDER_SITE_OTHER): Payer: Medicare Other | Admitting: Physician Assistant

## 2020-03-15 ENCOUNTER — Encounter: Payer: Self-pay | Admitting: Orthopedic Surgery

## 2020-03-15 DIAGNOSIS — M86172 Other acute osteomyelitis, left ankle and foot: Secondary | ICD-10-CM

## 2020-03-15 NOTE — Progress Notes (Signed)
Office Visit Note   Patient: Olivia Romero           Date of Birth: 1956/05/07           MRN: [id number] Visit Date: 03/15/2020              Requested by: Delsa Grana, PA-C [address] [place],  [place] [address] PCP: Delsa Grana, PA-C  Chief Complaint  Patient presents with  . Left Knee - Pain      HPI: Patient is 10 months status post left below-knee amputation.  She is doing quite well.  She is here today because she has had significant volume loss with her stump and her socket no longer fits.  She is wearing 9 ply sock plus shrinker.  She still is very unstable because of this.  Assessment & Plan: Visit Diagnoses: No diagnosis found.  Plan: Patient may follow-up as needed a prescription was provided for her new socket and supplies Patient is an existing left transtibial  amputee.  Patient's current comorbidities are not expected to impact the ability to function with the prescribed prosthesis. Patient verbally communicates a strong desire to use a prosthesis. Patient currently requires mobility aids to ambulate without a prosthesis.  Expects not to use mobility aids with a new prosthesis.  Patient is a K2 level ambulator that will use a prosthesis to walk around their home and the community over low level environmental barriers.     Follow-Up Instructions: No follow-ups on file.   Ortho Exam  Patient is alert, oriented, no adenopathy, well-dressed, normal affect, normal respiratory effort. Focused examination amputation stump is well-healed without any erythema or swelling.  She has no cellulitis.  She does have significant volume loss and obvious that the socket is currently no longer fitting properly  Imaging: No results found. No images are attached to the encounter.  Labs: Lab Results  Component Value Date   HGBA1C 7.3 (H) 05/16/2019   HGBA1C 7.9 09/17/2018   HGBA1C 7.0 05/08/2018   ESRSEDRATE 60 (H) 12/05/2018   ESRSEDRATE 41 (H)  12/04/2018   ESRSEDRATE 30 07/12/2018   CRP 22.0 (H) 12/05/2018   CRP 20 (H) 12/04/2018   CRP 6 07/12/2018   LABURIC 8.2 (H) 11/30/2015   REPTSTATUS 12/12/2018 FINAL 12/06/2018   GRAMSTAIN  12/06/2018    RARE WBC PRESENT, PREDOMINANTLY PMN NO ORGANISMS SEEN    CULT  12/06/2018    RARE METHICILLIN RESISTANT STAPHYLOCOCCUS AUREUS WITH NORMAL SKIN FLORA NO ANAEROBES ISOLATED Performed at [facility], [place] [address], [place], [place] [address]    LABORGA METHICILLIN RESISTANT STAPHYLOCOCCUS AUREUS 12/06/2018     Lab Results  Component Value Date   ALBUMIN 3.7 12/04/2018   ALBUMIN 4.0 11/27/2018   ALBUMIN 3.8 03/11/2018   PREALBUMIN 21.5 03/12/2018   LABURIC 8.2 (H) 11/30/2015    No results found for: MG Lab Results  Component Value Date   VD25OH 11 (L) 09/20/2016    Lab Results  Component Value Date   PREALBUMIN 21.5 03/12/2018   CBC EXTENDED Latest Ref Rng & Units 05/16/2019 12/11/2018 12/11/2018  WBC 4.0 - 10.5 K/uL 8.4 10.6(H) 11.6(H)  RBC 3.87 - 5.11 MIL/uL 4.35 3.73(L) 3.76(L)  HGB 12.0 - 15.0 g/dL 11.2(L) 9.8(L) 9.8(L)  HCT 36 - 46 % 36.1 30.8(L) 31.3(L)  PLT 150 - 400 K/uL 310 503(H) 482(H)  NEUTROABS 1.7 - 7.7 K/uL - - -  LYMPHSABS 0.7 - 4.0 K/uL - - -  There is no height or weight on file to calculate BMI.  Orders:  No orders of the defined types were placed in this encounter.  No orders of the defined types were placed in this encounter.    Procedures: No procedures performed  Clinical Data: No additional findings.  ROS:  All other systems negative, except as noted in the HPI. Review of Systems  Objective: Vital Signs: LMP  (LMP Unknown)   Specialty Comments:  No specialty comments available.  PMFS History: Patient Active Problem List   Diagnosis Date Noted  . Allergy to statin medication 12/01/2019  . History of below knee amputation, left ([place]) 07/30/2019  . Gangrene of left foot ([place]) 05/16/2019  . Subacute osteomyelitis  of left foot ([place])   . Charcot's joint of foot, left   . Post-operative state   . PAD (peripheral artery disease) ([place]) 12/05/2018  . Osteomyelitis of ankle or foot, acute, left ([place]) 12/05/2018  . MRSA bacteremia 12/05/2018  . Sepsis without acute organ dysfunction ([place])   . Charcot's joint of foot due to diabetes ([place]) 09/18/2018  . BMI 36.0-36.9,adult   . Morbid obesity ([place]) 11/19/2017  . B12 deficiency 11/01/2017  . Diabetic foot ulcer ([place]) 12/14/2016  . Diabetic polyneuropathy associated with type 2 diabetes mellitus ([place]) 12/13/2015  . Lumbar stenosis with neurogenic claudication 09/02/2015  . Chronic constipation 07/13/2015  . OSA (obstructive sleep apnea) 06/25/2015  . 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/23/2015  . Hypertriglyceridemia 04/23/2015  . Asthma, mild intermittent 02/01/2015  . Type 2 diabetes mellitus with diabetic nephropathy ([place]) 12/31/2014  . Gastroesophageal reflux disease with esophagitis 12/31/2014  . Dysphagia 12/31/2014  . Bilateral carpal tunnel syndrome 12/01/2014  . Diverticulosis of colon (without mention of hemorrhage) 11/30/2012  . Essential hypertension, benign 11/28/2012   Past Medical History:  Diagnosis Date  . Anemia   . Arthritis   . Asthma   . Colon polyps    adenomatous  . Diabetes mellitus without complication ([place])   . Diabetic infection of left foot ([place]) 12/2018  . Diverticulosis of colon   . Esophagitis   . GERD (gastroesophageal reflux disease)   . Hemorrhoid    internal  . Hyperlipemia   . Hypertension   . IBS (irritable bowel syndrome)    no current prob - diet controlled  . Myalgia due to statin 11/19/2017  . Neuropathy   . Neuropathy of both feet   . Neuropathy of hand   . Pneumonia    x 4  . PONV (postoperative nausea and vomiting)    on some surgeries but not all procedures  . Skin ulcer of right ankle, limited to breakdown of skin ([place]) 12/31/2016   resolved per patient 05/14/19  . Sleep  apnea    has had in the past lost 50 pounds and do longer uses cpap  . Statin intolerance 04/23/2015  . Stroke [facility]) 2009   mini stroke per patient  . Stroke-like episode 2009   TIA - mini stroke per patient  . 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 Uncontrolled type 2 diabetes mellitus with hyperglycemia ([place]) 11/01/2017    Family History  Problem Relation Age of Onset  . Lung cancer Father   . Hypertension Father   . Arthritis Father   . Other Mother        hardening of the arteries/renal cell carcenoma  . Hypertension Mother   . Stroke Mother   . Kidney cancer Mother   .  Arthritis Brother   . Rheum arthritis Maternal Uncle   . Bladder Cancer Neg Hx     Past Surgical History:  Procedure Laterality Date  . ABDOMINAL HYSTERECTOMY    . AMPUTATION Left 12/06/2018   Procedure: PARTIAL AMPUTATION LEFT FOOT;  Surgeon: Edrick Kins, DPM;  Location: [place];  Service: Podiatry;  Laterality: Left;  . AMPUTATION Left 05/16/2019   Procedure: LEFT BELOW KNEE AMPUTATION;  Surgeon: Newt Minion, MD;  Location: [place];  Service: Orthopedics;  Laterality: Left;  . BACK SURGERY  09/02/2015  . BONE BIOPSY Left 12/06/2018   Procedure: Bone Biopsy;  Surgeon: Edrick Kins, DPM;  Location: [place];  Service: Podiatry;  Laterality: Left;  . CERVICAL FUSION    . CESAREAN SECTION  1986  . COLONOSCOPY N/A 11/30/2012   Procedure: COLONOSCOPY;  Surgeon: Irene Shipper, MD;  [facility];  Service: Endoscopy;  Laterality: N/A;  . COLONOSCOPY WITH PROPOFOL N/A 01/03/2016   Procedure: COLONOSCOPY WITH PROPOFOL;  Surgeon: Manya Silvas, MD;  Location: [facility] ENDOSCOPY;  Service: Endoscopy;  Laterality: N/A;  . ESOPHAGOGASTRODUODENOSCOPY (EGD) WITH PROPOFOL N/A 01/14/2015   Procedure: ESOPHAGOGASTRODUODENOSCOPY (EGD) WITH PROPOFOL;  Surgeon: Manya Silvas, MD;  Location: [facility] ENDOSCOPY;  Service: Endoscopy;  Laterality: N/A;  . IRRIGATION AND DEBRIDEMENT FOOT Left  03/13/2018   Procedure: IRRIGATION AND DEBRIDEMENT FOOT WITH BONE BIOPSY WITH MISONIX DEBRIDER;  Surgeon: Evelina Bucy, DPM;  Location: [place];  Service: Podiatry;  Laterality: Left;  . IRRIGATION AND DEBRIDEMENT FOOT Left 12/06/2018   Procedure: Irrigation And Debridement Foot;  Surgeon: Edrick Kins, DPM;  Location: [place];  Service: Podiatry;  Laterality: Left;  . LEG AMPUTATION BELOW KNEE Left   . LUMBAR WOUND DEBRIDEMENT N/A 10/01/2015   Procedure: LUMBAR WOUND DEBRIDEMENT;  Surgeon: Ashok Pall, MD;  Location: [place] NEURO ORS;  Service: Neurosurgery;  Laterality: N/A;  LUMBAR WOUND DEBRIDEMENT  . NASAL SINUS SURGERY    . SAVORY DILATION N/A 01/14/2015   Procedure: SAVORY DILATION;  Surgeon: Manya Silvas, MD;  Location:  [facility] ENDOSCOPY;  Service: Endoscopy;  Laterality: N/A;  . TONSILLECTOMY    . WISDOM TOOTH EXTRACTION     Social History   Occupational History  . Occupation: histology Engineer, production: [employer]  Tobacco Use  . Smoking status: Never Smoker  . Smokeless tobacco: Never Used  Vaping Use  . Vaping Use: Never used  Substance and Sexual Activity  . Alcohol use: Never  . Drug use: No  . Sexual activity: Not Currently    Comment: Hysterectomy

## 2020-03-19 NOTE — Telephone Encounter (Signed)
error 

## 2020-03-25 ENCOUNTER — Ambulatory Visit: Payer: Self-pay

## 2020-03-25 ENCOUNTER — Ambulatory Visit: Payer: Medicare Other | Admitting: Orthopedic Surgery

## 2020-03-25 ENCOUNTER — Encounter: Payer: Self-pay | Admitting: Orthopedic Surgery

## 2020-03-25 VITALS — Ht 60.0 in | Wt 199.0 lb

## 2020-03-25 DIAGNOSIS — M25531 Pain in right wrist: Secondary | ICD-10-CM

## 2020-03-25 DIAGNOSIS — M1A0310 Idiopathic chronic gout, right wrist, without tophus (tophi): Secondary | ICD-10-CM

## 2020-03-25 MED ORDER — COLCHICINE 0.6 MG PO CAPS: 0.6000 mg | ORAL_CAPSULE | Freq: Two times a day (BID) | ORAL | 1 refills | Status: DC | PRN

## 2020-03-25 MED ORDER — ALLOPURINOL 100 MG PO TABS: 100.0000 mg | ORAL_TABLET | Freq: Two times a day (BID) | ORAL | 3 refills | Status: DC

## 2020-03-25 NOTE — Addendum Note (Signed)
Addended by: Pamella Pert on: 03/25/2020 09:04 AM   Modules accepted: Orders

## 2020-03-25 NOTE — Progress Notes (Signed)
Office Visit Note   Patient: Olivia Romero           Date of Birth: 1955-06-25           MRN: [id number] Visit Date: 03/25/2020              Requested by: Delsa Grana, PA-C [address] [place],  [place] [address] PCP: Delsa Grana, PA-C  Chief Complaint  Patient presents with  . Right Wrist - Pain      HPI: Patient is a 64 year old woman who presents with acute pain right wrist complains of pain dorsally over the entire wrist she states she has been using her rolling walker as well as [place] crutches and feels like this may have flared up her problem. She states she has had carpal tunnel injections which have helped in the past she has used ice and a Velcro splint.  Patient states she has had a history of gout in her foot.  Assessment & Plan: Visit Diagnoses:  1. Pain in right wrist   2. 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 Follow-up in 4 weeks.  Follow-Up Instructions: No follow-ups on file.   Ortho Exam  Patient is alert, oriented, no adenopathy, well-dressed, normal affect, normal respiratory effort. Examination patient has swelling and pain globally to palpation dorsally over the wrist pain over the scaphoid scapholunate and TFCC she is also painful over the first dorsal extensor compartment. Radiographs show periarticular cystic changes. Patient's renal function is normal.  Imaging: XR Wrist 2 Views Right  Result Date: 03/25/2020 Two view radiographs of the right wrist shows some degenerative periarticular cystic changes old evulsion off the ulnar styloid.  No images are attached to the encounter.  Labs: Lab Results  Component Value Date   HGBA1C 7.3 (H) 05/16/2019   HGBA1C 7.9 09/17/2018   HGBA1C 7.0 05/08/2018   ESRSEDRATE 60 (H) 12/05/2018   ESRSEDRATE 41 (H) 12/04/2018   ESRSEDRATE 30 07/12/2018   CRP 22.0 (H) 12/05/2018   CRP 20  (H) 12/04/2018   CRP 6 07/12/2018   LABURIC 8.2 (H) 11/30/2015   REPTSTATUS 12/12/2018 FINAL 12/06/2018   GRAMSTAIN  12/06/2018    RARE WBC PRESENT, PREDOMINANTLY PMN NO ORGANISMS SEEN    CULT  12/06/2018    RARE METHICILLIN RESISTANT STAPHYLOCOCCUS AUREUS WITH NORMAL SKIN FLORA NO ANAEROBES ISOLATED Performed at [facility], [place] [address], [place], [place] [address]    LABORGA METHICILLIN RESISTANT STAPHYLOCOCCUS AUREUS 12/06/2018     Lab Results  Component Value Date   ALBUMIN 3.7 12/04/2018   ALBUMIN 4.0 11/27/2018   ALBUMIN 3.8 03/11/2018   PREALBUMIN 21.5 03/12/2018   LABURIC 8.2 (H) 11/30/2015    No results found for: MG Lab Results  Component Value Date   VD25OH 11 (L) 09/20/2016    Lab Results  Component Value Date   PREALBUMIN 21.5 03/12/2018   CBC EXTENDED Latest Ref Rng & Units 05/16/2019 12/11/2018 12/11/2018  WBC 4.0 - 10.5 K/uL 8.4 10.6(H) 11.6(H)  RBC 3.87 - 5.11 MIL/uL 4.35 3.73(L) 3.76(L)  HGB 12.0 - 15.0 g/dL 11.2(L) 9.8(L) 9.8(L)  HCT 36 - 46 % 36.1 30.8(L) 31.3(L)  PLT 150 - 400 K/uL 310 503(H) 482(H)  NEUTROABS 1.7 - 7.7 K/uL - - -  LYMPHSABS 0.7 - 4.0 K/uL - - -     Body mass index is 38.86 kg/m.  Orders:  Orders Placed This Encounter  Procedures  . XR Wrist 2 Views Right   No orders of the defined types were placed in this encounter.    Procedures: No procedures performed  Clinical Data: No additional findings.  ROS:  All other systems negative, except as noted in the HPI. Review of Systems  Objective: Vital Signs: Ht 5' (1.524 m)   Wt 199 lb (90.3 kg)   LMP  (LMP Unknown)   BMI 38.86 kg/m   Specialty Comments:  No specialty comments available.  PMFS History: Patient Active Problem List   Diagnosis Date Noted  . Allergy to statin medication 12/01/2019  . History of below knee amputation, left ([place]) 07/30/2019  . Gangrene of left foot ([place]) 05/16/2019  . Subacute osteomyelitis of left foot ([place])   .  Charcot's joint of foot, left   . Post-operative state   . PAD (peripheral artery disease) ([place]) 12/05/2018  . Osteomyelitis of ankle or foot, acute, left ([place]) 12/05/2018  . MRSA bacteremia 12/05/2018  . Sepsis without acute organ dysfunction ([place])   . Charcot's joint of foot due to diabetes ([place]) 09/18/2018  . BMI 36.0-36.9,adult   . Morbid obesity ([place]) 11/19/2017  . B12 deficiency 11/01/2017  . Diabetic foot ulcer ([place]) 12/14/2016  . Diabetic polyneuropathy associated with type 2 diabetes mellitus ([place]) 12/13/2015  . Lumbar stenosis with neurogenic claudication 09/02/2015  . Chronic constipation 07/13/2015  . OSA (obstructive sleep apnea) 06/25/2015  . 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/23/2015  . Hypertriglyceridemia 04/23/2015  . Asthma, mild intermittent 02/01/2015  . Type 2 diabetes mellitus with diabetic nephropathy ([place]) 12/31/2014  . Gastroesophageal reflux disease with esophagitis 12/31/2014  . Dysphagia 12/31/2014  . Bilateral carpal tunnel syndrome 12/01/2014  . Diverticulosis of colon (without mention of hemorrhage) 11/30/2012  . Essential hypertension, benign 11/28/2012   Past Medical History:  Diagnosis Date  . Anemia   . Arthritis   . Asthma   . Colon polyps    adenomatous  . Diabetes mellitus without complication ([place])   . Diabetic infection of left foot ([place]) 12/2018  . Diverticulosis of colon   . Esophagitis   . GERD (gastroesophageal reflux disease)   . Hemorrhoid    internal  . Hyperlipemia   . Hypertension   . IBS (irritable bowel syndrome)    no current prob - diet controlled  . Myalgia due to statin 11/19/2017  . Neuropathy   . Neuropathy of both feet   . Neuropathy of hand   . Pneumonia    x 4  . PONV (postoperative nausea and vomiting)    on some surgeries but not all procedures  . Skin ulcer of right ankle, limited to breakdown of skin ([place]) 12/31/2016   resolved per patient 05/14/19  . Sleep apnea    has had in the  past lost 50 pounds and do longer uses cpap  . Statin intolerance 04/23/2015  . Stroke [facility]) 2009   mini stroke per patient  . Stroke-like episode 2009   TIA - mini stroke per patient  . 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 ([place]) 07/13/2015  . Uncontrolled type 2 diabetes mellitus with hyperglycemia ([place]) 11/01/2017    Family History  Problem Relation Age of Onset  . Lung cancer Father   . Hypertension Father   . Arthritis Father   . Other Mother        hardening of the arteries/renal cell carcenoma  . Hypertension Mother   . Stroke  Mother   . Kidney cancer Mother   . Arthritis Brother   . Rheum arthritis Maternal Uncle   . Bladder Cancer Neg Hx     Past Surgical History:  Procedure Laterality Date  . ABDOMINAL HYSTERECTOMY    . AMPUTATION Left 12/06/2018   Procedure: PARTIAL AMPUTATION LEFT FOOT;  Surgeon: Edrick Kins, DPM;  Location: [facility];  Service: Podiatry;  Laterality: Left;  . AMPUTATION Left 05/16/2019   Procedure: LEFT BELOW KNEE AMPUTATION;  Surgeon: Newt Minion, MD;  Location: [facility];  Service: Orthopedics;  Laterality: Left;  . BACK SURGERY  09/02/2015  . BONE BIOPSY Left 12/06/2018   Procedure: Bone Biopsy;  Surgeon: Edrick Kins, DPM;  Location: [facility];  Service: Podiatry;  Laterality: Left;  . CERVICAL FUSION    . CESAREAN SECTION  1986  . COLONOSCOPY N/A 11/30/2012   Procedure: COLONOSCOPY;  Surgeon: Irene Shipper, MD;  Location: [facility] ENDOSCOPY;  Service: Endoscopy;  Laterality: N/A;  . COLONOSCOPY WITH PROPOFOL N/A 01/03/2016   Procedure: COLONOSCOPY WITH PROPOFOL;  Surgeon: Manya Silvas, MD;  Location: [facility] ENDOSCOPY;  Service: Endoscopy;  Laterality: N/A;  . ESOPHAGOGASTRODUODENOSCOPY (EGD) WITH PROPOFOL N/A 01/14/2015   Procedure: ESOPHAGOGASTRODUODENOSCOPY (EGD) WITH PROPOFOL;  Surgeon: Manya Silvas, MD;  Location: [facility] ENDOSCOPY;  Service: Endoscopy;  Laterality: N/A;  . IRRIGATION AND DEBRIDEMENT FOOT Left 03/13/2018   Procedure:  IRRIGATION AND DEBRIDEMENT FOOT WITH BONE BIOPSY WITH MISONIX DEBRIDER;  Surgeon: Evelina Bucy, DPM;  Location: [facility];  Service: Podiatry;  Laterality: Left;  . IRRIGATION AND DEBRIDEMENT FOOT Left 12/06/2018   Procedure: Irrigation And Debridement Foot;  Surgeon: Edrick Kins, DPM;  Location: [place];  Service: Podiatry;  Laterality: Left;  . LEG AMPUTATION BELOW KNEE Left   . LUMBAR WOUND DEBRIDEMENT N/A 10/01/2015   Procedure: LUMBAR WOUND DEBRIDEMENT;  Surgeon: Ashok Pall, MD;  Location: [place] [facility]S;  Service: Neurosurgery;  Laterality: N/A;  LUMBAR WOUND DEBRIDEMENT  . NASAL SINUS SURGERY    . SAVORY DILATION N/A 01/14/2015   Procedure: SAVORY DILATION;  Surgeon: Manya Silvas, MD;  Location: [facility] ENDOSCOPY;  Service: Endoscopy;  Laterality: N/A;  . TONSILLECTOMY    . WISDOM TOOTH EXTRACTION     Social History   Occupational History  . Occupation: histology Engineer, production: [employer]  Tobacco Use  . Smoking status: Never Smoker  . Smokeless tobacco: Never Used  Vaping Use  . Vaping Use: Never used  Substance and Sexual Activity  . Alcohol use: Never  . Drug use: No  . Sexual activity: Not Currently    Comment: Hysterectomy

## 2020-03-26 LAB — URIC ACID: Uric Acid, Serum: 8.7 mg/dL — ABNORMAL HIGH (ref 2.5–7.0)

## 2020-04-22 ENCOUNTER — Ambulatory Visit (INDEPENDENT_AMBULATORY_CARE_PROVIDER_SITE_OTHER): Payer: Medicare Other | Admitting: Physician Assistant

## 2020-04-22 ENCOUNTER — Ambulatory Visit: Payer: Medicare Other | Admitting: Orthopedic Surgery

## 2020-04-22 ENCOUNTER — Encounter: Payer: Self-pay | Admitting: Orthopedic Surgery

## 2020-04-22 VITALS — Ht 60.0 in | Wt 199.0 lb

## 2020-04-22 DIAGNOSIS — M1A0310 Idiopathic chronic gout, right wrist, without tophus (tophi): Secondary | ICD-10-CM

## 2020-04-22 DIAGNOSIS — M25531 Pain in right wrist: Secondary | ICD-10-CM | POA: Diagnosis not present

## 2020-04-22 NOTE — Progress Notes (Signed)
Office Visit Note   Patient: CAPRICIA SERDA           Date of Birth: 1956/01/20           MRN: [id number] Visit Date: 04/22/2020              Requested by: Delsa Grana, PA-C [address] [place],  [place] [address] PCP: Delsa Grana, PA-C  Chief Complaint  Patient presents with  . Right Wrist - Follow-up      HPI: This is a pleasant woman who follows up for her right wrist pain today.  At her last visit suspicions were high for gout.  She was placed on allopurinol.  Her uric acid was 8.7.  She feels much better and is here for follow-up  Assessment & Plan: Visit Diagnoses:  1. 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 Pain in right wrist     Plan: Uric acid was drawn today we will discussed the results with her once available.  Follow-up will also be based on this.  Follow-Up Instructions: No follow-ups on file.   Ortho Exam  Patient is alert, oriented, no adenopathy, well-dressed, normal affect, normal respiratory effort. Examination of her right wrist she has no erythema no swelling fairly easy painless range of motion.  No signs of infection  Imaging: No results found. No images are attached to the encounter.  Labs: Lab Results  Component Value Date   HGBA1C 7.3 (H) 05/16/2019   HGBA1C 7.9 09/17/2018   HGBA1C 7.0 05/08/2018   ESRSEDRATE 60 (H) 12/05/2018   ESRSEDRATE 41 (H) 12/04/2018   ESRSEDRATE 30 07/12/2018   CRP 22.0 (H) 12/05/2018   CRP 20 (H) 12/04/2018   CRP 6 07/12/2018   LABURIC 8.7 (H) 03/25/2020   LABURIC 8.2 (H) 11/30/2015   REPTSTATUS 12/12/2018 FINAL 12/06/2018   GRAMSTAIN  12/06/2018    RARE WBC PRESENT, PREDOMINANTLY PMN NO ORGANISMS SEEN    CULT  12/06/2018    RARE METHICILLIN RESISTANT STAPHYLOCOCCUS AUREUS WITH NORMAL SKIN FLORA NO ANAEROBES ISOLATED Performed at [facility], [place] [address], [place], [place] [address]    LABORGA METHICILLIN RESISTANT STAPHYLOCOCCUS AUREUS 12/06/2018      Lab Results  Component Value Date   ALBUMIN 3.7 12/04/2018   ALBUMIN 4.0 11/27/2018   ALBUMIN 3.8 03/11/2018   PREALBUMIN 21.5 03/12/2018   LABURIC 8.7 (H) 03/25/2020   LABURIC 8.2 (H) 11/30/2015    No results found for: MG Lab Results  Component Value Date   VD25OH 11 (L) 09/20/2016    Lab Results  Component Value Date   PREALBUMIN 21.5 03/12/2018   CBC EXTENDED Latest Ref Rng & Units 05/16/2019 12/11/2018 12/11/2018  WBC 4.0 - 10.5 K/uL 8.4 10.6(H) 11.6(H)  RBC 3.87 - 5.11 MIL/uL 4.35 3.73(L) 3.76(L)  HGB 12.0 - 15.0 g/dL 11.2(L) 9.8(L) 9.8(L)  HCT 36 - 46 % 36.1 30.8(L) 31.3(L)  PLT 150 - 400 K/uL 310 503(H) 482(H)  NEUTROABS 1.7 - 7.7 K/uL - - -  LYMPHSABS 0.7 - 4.0 K/uL - - -     Body mass index is 38.86 kg/m.  Orders:  Orders Placed This Encounter  Procedures  . Uric acid   No orders of the defined types were placed in this encounter.    Procedures: No procedures performed  Clinical Data: No additional findings.  ROS:  All other systems negative, except as noted in the HPI. Review of Systems  Objective: Vital Signs: Ht  5' (1.524 m)   Wt 199 lb (90.3 kg)   LMP  (LMP Unknown)   BMI 38.86 kg/m   Specialty Comments:  No specialty comments available.  PMFS History: Patient Active Problem List   Diagnosis Date Noted  . Allergy to statin medication 12/01/2019  . History of below knee amputation, left ([place]) 07/30/2019  . Gangrene of left foot ([place]) 05/16/2019  . Subacute osteomyelitis of left foot ([place])   . Charcot's joint of foot, left   . Post-operative state   . PAD (peripheral artery disease) ([place]) 12/05/2018  . Osteomyelitis of ankle or foot, acute, left ([place]) 12/05/2018  . MRSA bacteremia 12/05/2018  . Sepsis without acute organ dysfunction ([place])   . Charcot's joint of foot due to diabetes ([place]) 09/18/2018  . BMI 36.0-36.9,adult   . Morbid obesity ([place]) 11/19/2017  . B12 deficiency 11/01/2017  . Diabetic foot ulcer ([place])  12/14/2016  . Diabetic polyneuropathy associated with type 2 diabetes mellitus ([place]) 12/13/2015  . Lumbar stenosis with neurogenic claudication 09/02/2015  . Chronic constipation 07/13/2015  . OSA (obstructive sleep apnea) 06/25/2015  . 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/23/2015  . Hypertriglyceridemia 04/23/2015  . Asthma, mild intermittent 02/01/2015  . Type 2 diabetes mellitus with diabetic nephropathy ([place]) 12/31/2014  . Gastroesophageal reflux disease with esophagitis 12/31/2014  . Dysphagia 12/31/2014  . Bilateral carpal tunnel syndrome 12/01/2014  . Diverticulosis of colon (without mention of hemorrhage) 11/30/2012  . Essential hypertension, benign 11/28/2012   Past Medical History:  Diagnosis Date  . Anemia   . Arthritis   . Asthma   . Colon polyps    adenomatous  . Diabetes mellitus without complication ([place])   . Diabetic infection of left foot ([place]) 12/2018  . Diverticulosis of colon   . Esophagitis   . GERD (gastroesophageal reflux disease)   . Hemorrhoid    internal  . Hyperlipemia   . Hypertension   . IBS (irritable bowel syndrome)    no current prob - diet controlled  . Myalgia due to statin 11/19/2017  . Neuropathy   . Neuropathy of both feet   . Neuropathy of hand   . Pneumonia    x 4  . PONV (postoperative nausea and vomiting)    on some surgeries but not all procedures  . Skin ulcer of right ankle, limited to breakdown of skin ([place]) 12/31/2016   resolved per patient 05/14/19  . Sleep apnea    has had in the past lost 50 pounds and do longer uses cpap  . Statin intolerance 04/23/2015  . Stroke [facility]) 2009   mini stroke per patient  . Stroke-like episode 2009   TIA - mini stroke per patient  . 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 ([place]) 07/13/2015  . Uncontrolled type 2 diabetes mellitus with hyperglycemia ([place]) 11/01/2017    Family History  Problem Relation Age of Onset  . Lung cancer Father   . Hypertension Father    . Arthritis Father   . Other Mother        hardening of the arteries/renal cell carcenoma  . Hypertension Mother   . Stroke Mother   . Kidney cancer Mother   . Arthritis Brother   . Rheum arthritis Maternal Uncle   . Bladder Cancer Neg Hx     Past Surgical History:  Procedure Laterality Date  . ABDOMINAL HYSTERECTOMY    . AMPUTATION Left 12/06/2018   Procedure: PARTIAL AMPUTATION LEFT FOOT;  Surgeon: Edrick Kins, DPM;  Location: [facility];  Service: Podiatry;  Laterality: Left;  . AMPUTATION Left 05/16/2019   Procedure: LEFT BELOW KNEE AMPUTATION;  Surgeon: Newt Minion, MD;  Location: [facility];  Service: Orthopedics;  Laterality: Left;  . BACK SURGERY  09/02/2015  . BONE BIOPSY Left 12/06/2018   Procedure: Bone Biopsy;  Surgeon: Edrick Kins, DPM;  Location: [facility];  Service: Podiatry;  Laterality: Left;  . CERVICAL FUSION    . CESAREAN SECTION  1986  . COLONOSCOPY N/A 11/30/2012   Procedure: COLONOSCOPY;  Surgeon: Irene Shipper, MD;  Location[facility]OSCOPY;  Service: Endoscopy;  Laterality: N/A;  . COLONOSCOPY WITH PROPOFOL N/A 01/03/2016   Procedure: COLONOSCOPY WITH PROPOFOL;  Surgeon: Manya Silvas, MD;  Location: [facility] ENDOSCOPY;  Service: Endoscopy;  Laterality: N/A;  . ESOPHAGOGASTRODUODENOSCOPY (EGD) WITH PROPOFOL N/A 01/14/2015   Procedure: ESOPHAGOGASTRODUODENOSCOPY (EGD) WITH PROPOFOL;  Surgeon: Manya Silvas, MD;  Location: [facility] ENDOSCOPY;  Service: Endoscopy;  Laterality: N/A;  . IRRIGATION AND DEBRIDEMENT FOOT Left 03/13/2018   Procedure: IRRIGATION AND DEBRIDEMENT FOOT WITH BONE BIOPSY WITH MISONIX DEBRIDER;  Surgeon: Evelina Bucy, DPM;  Location: [facility];  Service: Podiatry;  Laterality: Left;  . IRRIGATION AND DEBRIDEMENT FOOT Left 12/06/2018   Procedure: Irrigation And Debridement Foot;  Surgeon: Edrick Kins, DPM;  Location: [facility];  Service: Podiatry;  Laterality: Left;  . LEG AMPUTATION BELOW KNEE Left   . LUMBAR WOUND DEBRIDEMENT N/A 10/01/2015   Procedure:  LUMBAR WOUND DEBRIDEMENT;  Surgeon: Ashok Pall, MD;  Location: [facility] NEURO ORS;  Service: Neurosurgery;  Laterality: N/A;  LUMBAR WOUND DEBRIDEMENT  . NASAL SINUS SURGERY    . SAVORY DILATION N/A 01/14/2015   Procedure: SAVORY DILATION;  Surgeon: Manya Silvas, MD;  Location: [facility] ENDOSCOPY;  Service: Endoscopy;  Laterality: N/A;  . TONSILLECTOMY    . WISDOM TOOTH EXTRACTION     Social History   Occupational History  . Occupation: histology Engineer, production: [employer]  Tobacco Use  . Smoking status: Never Smoker  . Smokeless tobacco: Never Used  Vaping Use  . Vaping Use: Never used  Substance and Sexual Activity  . Alcohol use: Never  . Drug use: No  . Sexual activity: Not Currently    Comment: Hysterectomy

## 2020-04-23 LAB — URIC ACID: Uric Acid, Serum: 5.4 mg/dL (ref 2.5–7.0)

## 2020-04-26 ENCOUNTER — Encounter: Payer: Self-pay | Admitting: Urology

## 2020-04-26 ENCOUNTER — Other Ambulatory Visit: Payer: Self-pay

## 2020-04-26 ENCOUNTER — Ambulatory Visit (INDEPENDENT_AMBULATORY_CARE_PROVIDER_SITE_OTHER): Payer: Medicare Other | Admitting: Urology

## 2020-04-26 VITALS — BP 141/79 | HR 93 | Wt 199.0 lb

## 2020-04-26 DIAGNOSIS — N3946 Mixed incontinence: Secondary | ICD-10-CM | POA: Diagnosis not present

## 2020-04-26 NOTE — Progress Notes (Signed)
04/26/2020 10:22 AM   Olivia Romero [birth year] [id number]  Referring provider: Delsa Grana, PA-C [address] [place],  [place] [address]  Chief Complaint  Patient presents with  . Hematuria    HPI: I was consulted to assess the patient's urinary incontinence. I believe her primary symptom is urge incontinence and she can soak 3 or 4 pads a day. She sometimes leaks with coughing sneezing but not bending lifting. No bedwetting.  She time voids every 60 to 90 minutes and gets up twice at night.  She has had a TIA. She has had neck and lower back surgery. She is an insulin-dependent diabetic. She has had a hysterectomy  Patient has mixed incontinence but primarily urge incontinence. I will evaluate the patient in 8weeks on Gemtesa and Mybetriqsamples forfor cystoscopy. She may need urodynamics in the future  Patient took the Myrbetriq and was 80% or greater better.  Greatly reduced frequency and urge incontinence.  The new beta 3 agent helped but less so.  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  Reassess in 4 months on Myrbetriq samples and prescription 50 mg  Today Frequency stable Now that she times voids she can go without medication.  Myrbetriq was working well but was expensive.  She really does not want to start take medication because she think she is doing so much better.  Clinically not infected    PMH: Past Medical History:  Diagnosis Date  . Anemia   . Arthritis   . Asthma   . Colon polyps    adenomatous  . Diabetes mellitus without complication ([place])   . Diabetic infection of left foot ([place]) 12/2018  . Diverticulosis of colon   . Esophagitis   . GERD (gastroesophageal reflux disease)   . Hemorrhoid    internal  . Hyperlipemia   .  Hypertension   . IBS (irritable bowel syndrome)    no current prob - diet controlled  . Myalgia due to statin 11/19/2017  . Neuropathy   . Neuropathy of both feet   . Neuropathy of hand   . Pneumonia    x 4  . PONV (postoperative nausea and vomiting)    on some surgeries but not all procedures  . Skin ulcer of right ankle, limited to breakdown of skin ([place]) 12/31/2016   resolved per patient 05/14/19  . Sleep apnea    has had in the past lost 50 pounds and do longer uses cpap  . Statin intolerance 04/23/2015  . Stroke [facility]) 2009   mini stroke per patient  . Stroke-like episode 2009   TIA - mini stroke per patient  . 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 Uncontrolled type 2 diabetes mellitus with hyperglycemia ([place]) 11/01/2017    Surgical History: Past Surgical History:  Procedure Laterality Date  . ABDOMINAL HYSTERECTOMY    . AMPUTATION Left 12/06/2018   Procedure: PARTIAL AMPUTATION LEFT FOOT;  Surgeon: Edrick Kins, DPM;  Location: [facility];  Service: Podiatry;  Laterality: Left;  . AMPUTATION Left 05/16/2019   Procedure: LEFT BELOW KNEE AMPUTATION;  Surgeon: Newt Minion, MD;  Location: [place];  Service: Orthopedics;  Laterality: Left;  . BACK SURGERY  09/02/2015  . BONE BIOPSY Left 12/06/2018   Procedure: Bone Biopsy;  Surgeon: Edrick Kins, DPM;  Location: [place];  Service: Podiatry;  Laterality: Left;  . CERVICAL FUSION    . CESAREAN SECTION  1986  . COLONOSCOPY N/A 11/30/2012   Procedure: COLONOSCOPY;  Surgeon: Irene Shipper, MD;  Location: [facility] ENDOSCOPY;  Service: Endoscopy;  Laterality: N/A;  . COLONOSCOPY WITH PROPOFOL N/A 01/03/2016   Procedure: COLONOSCOPY WITH PROPOFOL;  Surgeon: Manya Silvas, MD;  Location: [facility] ENDOSCOPY;  Service: Endoscopy;  Laterality: N/A;  . ESOPHAGOGASTRODUODENOSCOPY (EGD) WITH PROPOFOL N/A 01/14/2015   Procedure: ESOPHAGOGASTRODUODENOSCOPY (EGD) WITH PROPOFOL;  Surgeon: Manya Silvas, MD;  Location: [facility]  ENDOSCOPY;  Service: Endoscopy;  Laterality: N/A;  . IRRIGATION AND DEBRIDEMENT FOOT Left 03/13/2018   Procedure: IRRIGATION AND DEBRIDEMENT FOOT WITH BONE BIOPSY WITH MISONIX DEBRIDER;  Surgeon: Evelina Bucy, DPM;  Location: [facility];  Service: Podiatry;  Laterality: Left;  . IRRIGATION AND DEBRIDEMENT FOOT Left 12/06/2018   Procedure: Irrigation And Debridement Foot;  Surgeon: Edrick Kins, DPM;  Location: [place];  Service: Podiatry;  Laterality: Left;  . LEG AMPUTATION BELOW KNEE Left   . LUMBAR WOUND DEBRIDEMENT N/A 10/01/2015   Procedure: LUMBAR WOUND DEBRIDEMENT;  Surgeon: Ashok Pall, MD;  Location: [place] NEURO ORS;  Service: Neurosurgery;  Laterality: N/A;  LUMBAR WOUND DEBRIDEMENT  . NASAL SINUS SURGERY    . SAVORY DILATION N/A 01/14/2015   Procedure: SAVORY DILATION;  Surgeon: Manya Silvas, MD;  Location: [facility] ENDOSCOPY;  Service: Endoscopy;  Laterality: N/A;  . TONSILLECTOMY    . WISDOM TOOTH EXTRACTION      Home Medications:  Allergies as of 04/26/2020      Reactions   Eggs Or Egg-derived Products Swelling, Other (See Comments)   Angioedema   Atorvastatin Other (See Comments)   Liver toxicity   Flu Virus Vaccine Swelling   Arm swelled (site of injection)   Latex Itching   Pravastatin Itching, Rash   Tape Rash, Other (See Comments)   Adhesive on foot pull off skin.  Ok to use paper tape.      Medication List       Accurate as of April 26, 2020 10:22 AM. If you have any questions, ask your nurse or doctor.        STOP taking these medications   nitrofurantoin (macrocrystal-monohydrate) 100 MG capsule Commonly known as: MACROBID Stopped by: Reece Packer, MD     TAKE these medications   acetaminophen 500 MG tablet Commonly known as: TYLENOL Take 1,000 mg by mouth every 6 (six) hours as needed for mild pain. Maximum of 3,000 mg per day   Albuterol Sulfate 108 (90 Base) MCG/ACT Aepb Commonly known as: ProAir RespiClick Inhale 1-2 puffs into the lungs  every 6 (six) hours as needed (Shortness of breath or wheezing).   ALKA-SELTZER PLUS COLD PO Take 2 tablets by mouth as needed (allergies). Day and night time   allopurinol 100 MG tablet Commonly known as: ZYLOPRIM Take 1 tablet (100 mg total) by mouth 2 (two) times daily.   aspirin EC 325 MG tablet Take 1 tablet (325 mg total) by mouth daily.   Assurance Fitted Brief Large Misc Use 1 brief up to 6 times a day as needed for urinary incontinence   Cholecalciferol 25 MCG (1000 UT) tablet Take by mouth.   Colchicine 0.6 MG Caps Take 0.6 mg by mouth 2 (two) times daily as needed.   Contour Next Monitor w/Device Kit as directed.   cyanocobalamin 1000 MCG tablet Take by mouth.   DULoxetine 60 MG  capsule Commonly known as: CYMBALTA Take by mouth. What changed: Another medication with the same name was removed. Continue taking this medication, and follow the directions you see here. Changed by: Reece Packer, MD   fenofibrate 145 MG tablet Commonly known as: TRICOR Take 1 tablet (145 mg total) by mouth daily.   gabapentin 400 MG capsule Commonly known as: NEURONTIN Take 800 mg by mouth 3 (three) times daily.   glimepiride 2 MG tablet Commonly known as: AMARYL Take 2 mg by mouth 2 (two) times daily.   hydrochlorothiazide 12.5 MG tablet Commonly known as: HYDRODIURIL Take 1 tablet (12.5 mg total) by mouth daily.   metFORMIN 500 MG 24 hr tablet Commonly known as: GLUCOPHAGE-XR Take 500 mg by mouth 3 (three) times daily.   mirabegron ER 50 MG Tb24 tablet Commonly known as: MYRBETRIQ Take 1 tablet (50 mg total) by mouth daily.   pantoprazole 40 MG tablet Commonly known as: PROTONIX Take 1 tablet (40 mg total) by mouth daily as needed (Heartburn).   Precision QID Test test strip Generic drug: glucose blood 1 each (1 strip total) by XX route once daily Use as instructed.   Tyler Aas FlexTouch 200 UNIT/ML FlexTouch Pen Generic drug: insulin degludec Inject 20  Units into the skin at bedtime.   UltiCare Short Pen Needles 31G X 8 MM Misc Generic drug: Insulin Pen Needle   Vitamin D (Ergocalciferol) 1.25 MG (50000 UNIT) Caps capsule Commonly known as: DRISDOL Take by mouth.       Allergies:  Allergies  Allergen Reactions  . Eggs Or Egg-Derived Products Swelling and Other (See Comments)    Angioedema  . Atorvastatin Other (See Comments)    Liver toxicity  . Flu Virus Vaccine Swelling    Arm swelled (site of injection)  . Latex Itching  . Pravastatin Itching and Rash  . Tape Rash and Other (See Comments)    Adhesive on foot pull off skin.  Ok to use paper tape.    Family History: Family History  Problem Relation Age of Onset  . Lung cancer Father   . Hypertension Father   . Arthritis Father   . Other Mother        hardening of the arteries/renal cell carcenoma  . Hypertension Mother   . Stroke Mother   . Kidney cancer Mother   . Arthritis Brother   . Rheum arthritis Maternal Uncle   . Bladder Cancer Neg Hx     Social History:  reports that she has never smoked. She has never used smokeless tobacco. She reports that she does not drink alcohol and does not use drugs.  ROS:                                        Physical Exam: BP (!) 141/79   Pulse 93   Wt 199 lb (90.3 kg)   LMP  (LMP Unknown)   BMI 38.86 kg/m   Constitutional:  Alert and oriented, No acute distress. HEENT: [place] AT, moist mucus membranes.  Trachea midline, no masses.   Laboratory Data: Lab Results  Component Value Date   WBC 8.4 05/16/2019   HGB 11.2 (L) 05/16/2019   HCT 36.1 05/16/2019   MCV 83.0 05/16/2019   PLT 310 05/16/2019    Lab Results  Component Value Date   CREATININE 0.84 05/16/2019    No results found for: PSA  No  results found for: TESTOSTERONE  Lab Results  Component Value Date   HGBA1C 7.3 (H) 05/16/2019    Urinalysis    Component Value Date/Time   COLORURINE YELLOW 12/04/2018 1707    APPEARANCEUR Hazy (A) 12/22/2019 0956   LABSPEC 1.022 12/04/2018 1707   PHURINE 5.0 12/04/2018 1707   GLUCOSEU Negative 12/22/2019 0956   HGBUR NEGATIVE 12/04/2018 1707   BILIRUBINUR Negative 12/22/2019 0956   KETONESUR NEGATIVE 12/04/2018 1707   PROTEINUR Negative 12/22/2019 0956   PROTEINUR NEGATIVE 12/04/2018 1707   UROBILINOGEN 0.2 09/30/2019 1128   NITRITE Negative 12/22/2019 0956   NITRITE NEGATIVE 12/04/2018 1707   LEUKOCYTESUR Negative 12/22/2019 0956   LEUKOCYTESUR NEGATIVE 12/04/2018 1707    Pertinent Imaging:   Assessment & Plan: See patient as needed  There are no diagnoses linked to this encounter.  No follow-ups on file.  Reece Packer, MD  [employer] [address], [place], [place] [address] [phone]

## 2020-05-24 ENCOUNTER — Ambulatory Visit (INDEPENDENT_AMBULATORY_CARE_PROVIDER_SITE_OTHER): Payer: Medicare Other | Admitting: Podiatry

## 2020-05-24 ENCOUNTER — Other Ambulatory Visit: Payer: Self-pay

## 2020-05-24 DIAGNOSIS — E1149 Type 2 diabetes mellitus with other diabetic neurological complication: Secondary | ICD-10-CM

## 2020-05-24 DIAGNOSIS — B351 Tinea unguium: Secondary | ICD-10-CM | POA: Diagnosis not present

## 2020-05-24 DIAGNOSIS — M79674 Pain in right toe(s): Secondary | ICD-10-CM | POA: Diagnosis not present

## 2020-05-27 NOTE — Progress Notes (Signed)
Subjective: 64 y.o. returns the office today for painful, elongated, thickened toenails which she cannot trim herself as well as her right foot check.  States that she has been doing well no new sores identified in the right foot.  She has been walking better using forearm crutches and also using the [place] brace on the right side.  PCP: Delsa Grana, PA-C  Objective: AAO 3, NAD DP/PT pulses palpable, CRT less than 3 seconds Left below-knee amputation Protective sensation decreased with Derrel Nip monofilament, Nails hypertrophic, dystrophic, elongated, brittle, discolored 5. There is tenderness overlying the nails 1-5 on the right. There is no surrounding erythema or drainage along the nail sites. Prominence of the fourth metatarsal head plantarly without any hyperkeratotic tissue.  There is no open lesions identified. No pain with calf compression, swelling, warmth, erythema.  Assessment: Patient presents with symptomatic onychomycosis  Plan: -Treatment options including alternatives, risks, complications were discussed -Nails sharply debrided 5 without complication/bleeding. -Continue to monitor right foot daily.  Moisturizer daily. -Continue [place] brace on the right side for stability and offloading. -Discussed daily foot inspection. If there are any changes, to call the office immediately.  -Follow-up as scheduled or sooner if any problems are to arise. In the meantime, encouraged to call the office with any questions, concerns, changes symptoms.  Celesta Gentile, DPM

## 2020-06-07 ENCOUNTER — Ambulatory Visit: Payer: Self-pay | Admitting: *Deleted

## 2020-06-07 NOTE — Telephone Encounter (Signed)
Patient calling in with complaints of chronic lower back pain that worsened today. Hx of back surgery in 2017 Patient sates that she has been able to walk a little but feels like she has muscles spasms that come and go.  Patient states that she is currently in the bed lying flat on her back and is not experiencing any pain. Patient states she is being seen by a neurologist and recently had dosages Cymbalta increased and Gabapentin decreased. Patient states that she was told by neurologist that she needs to be seen by primary provider before any additional changes made by neurology to medications. Patient requesting to be seen for virtual visit. Earliest appt with PCP not until 06/18/19. Patient scheduled for appt with Dr. Dorris Fetch on 06/10/19. Care advice given. Patient advised to return call to office for worsening pain or seek treatment in the ED or urgent care. Reason for Disposition . Back pain is a chronic symptom (recurrent or ongoing AND present > 4 weeks)  Answer Assessment - Initial Assessment Questions 1. ONSET: "When did the pain begin?"      Worsened this morning 2. LOCATION: "Where does it hurt?" (upper, mid or lower back)     Centered a little to the left of lower back 3. SEVERITY: "How bad is the pain?"  (e.g., Scale 1-10; mild, moderate, or severe)   - MILD (1-3): doesn't interfere with normal activities    - MODERATE (4-7): interferes with normal activities or awakens from sleep    - SEVERE (8-10): excruciating pain, unable to do any normal activities      Patient denies pain at this time, but states that when pain occurs it is a muscle spasm.  4. PATTERN: "Is the pain constant?" (e.g., yes, no; constant, intermittent)      Comes and goes 5. RADIATION: "Does the pain shoot into your legs or elsewhere?"     Radiates at left side 6. CAUSE:  "What do you think is causing the back pain?"      unknown 7. BACK OVERUSE:  "Any recent lifting of heavy objects, strenuous work or  exercise?"     no 8. MEDICATIONS: "What have you taken so far for the pain?" (e.g., nothing, acetaminophen, NSAIDS)     Have not taken any medications today 9. NEUROLOGIC SYMPTOMS: "Do you have any weakness, numbness, or problems with bowel/bladder control?"     Nothing out of the normal 10. OTHER SYMPTOMS: "Do you have any other symptoms?" (e.g., fever, abdominal pain, burning with urination, blood in urine)       n/a 11. PREGNANCY: "Is there any chance you are pregnant?" (e.g., yes, no; LMP)      n/a  Protocols used: BACK PAIN-A-AH

## 2020-06-08 NOTE — Progress Notes (Signed)
Name: Olivia Romero   MRN: [id number]    DOB: 1955/09/03   Date:06/09/2020       Progress Note  Subjective  Chief Complaint  Chief Complaint  Patient presents with  . Back Pain    I connected with  [name] on 06/09/20 at 10:40 AM EST by telephone and verified that I am speaking with the correct person using two identifiers.  I discussed the limitations, risks, security and privacy concerns of performing an evaluation and management service by telephone and the availability of in person appointments. The patient expressed understanding and agreed to proceed. Staff also discussed with the patient that there may be a patient responsible charge related to this service. Patient Location: Home Provider Location: [facility] Additional Individuals present: none  HPI  Patient is a 65 year old female patient of Delsa Grana Patient is an insulin-dependent diabetic, followed by endocrinology, status post BKA on the left. Has been followed recently by orthopedics and podiatry for pain in the right toe as well as right wrist pain with chronic gout noted.  Last saw neurology 03/30/2020 with the following noted:  1. Peripheral neuropathy -  - Increase Cymbalta to 120 mg daily  We will re-write for 3 months at a time Take 2 caps daily  Potential side effects of Cymbalta/duloxetine may include nausea, dry mouth, sleepiness, fatigue, constipation, dizziness, decreased appetite, increased sweating, headache, insomnia, increase in blood pressure, sexual dysfunction, and others. This medication is used with caution in patients that have underlying depression as it may increase suicidal thoughts, if this happens stop medication right away and contact your doctor. There is also a chance of serotonin syndrome when using this medication with certain other medications. If you experience symptoms such as agitation, changes in blood pressure, loose stools, a fast heartbeat, hallucinations, upset  stomach, throwing up, change in balance, and change in thinking clearly, please let your primary care physician OR [place] know. Do not stop this medication abruptly.  - Decrease Gabapentin from 800 to 600 mg three times a day.  New Rx for 100 mg cap  Take 2 caps (200 mg) with 400 mg cap for total for 600 mg x 4x per day.  2. Obstructive sleep apnea - Continue CPAP   3. Cognitive impairment complicated by [id number]   4. Asthma, possible moderate persistent additional evaluation  5. Urinary urgency and patient has concern regarding  6. Possible autoimmune or inflammatory arthropathy characterized by swelling and erythema that comes and goes affecting primarily hands, wrists, ankles and toes, unclear if seronegative rheumatoid arthritis, defer to Rheumatology Reviewed Vit B12, Vit B1, Vit B6, Vit D, ESR, ANA, RF, Sjogren antibodies, celiac disease panel, Angiotensin-Converting Enzyme, ANCA panel, CCP   Return in about 3 months (around 06/30/2020)  Also with a degenerative disease history as follows: degenerative joint disease: Patient had cervical surgery C3/C4/C5 by [facility] orthopedic on 01/19/2014.  Patient had back surgery at L4-5 September 02 2015 at [facility] with Dr. Christella Noa.  She left the following message 06/07/2020: Patient calling in with complaints of chronic lower back pain that worsened today. Hx of back surgery in 2017 Patient sates that she has been able to walk a little but feels like she has muscles spasms that come and go.  Patient states that she is currently in the bed lying flat on her back and is not experiencing any pain. Patient states she is being seen by a neurologist and recently had dosages Cymbalta increased and  Gabapentin decreased. Patient states that she was told by neurologist that she needs to be seen by primary provider before any additional changes made by neurology to medications. Patient requesting to be seen for virtual visit. Earliest appt with PCP not  until 06/18/19. Patient scheduled for appt with Dr. Roxan Hockey on 06/10/19. Care advice given. Patient advised to return call to office for worsening pain or seek treatment in the ED or urgent care.  On Monday, when got up was fine and as the day progressed had increasing low back pain. On Saturday walked a lot more than normal, and questioned if overdid it.  She noted her history of back surgery and was concerned with her back symptoms coming back again like prior to the surgery that was needed. Has been putting ice on it, and resting. Also using her walker more and not the crutches that puts more stress on her back. Not taking any medicines for pain. Pain not problematic at rest, more so when up and about. Noted goes down right side and back of the right leg to mid thigh level, not further, no foot drop No one time trauma before sx's started Is definitely better in last couple days since resting and applying the ice topically. No loss of bowel or bladder control All in all, she notes has improved significantly since she made the phone call a couple days prior.    Patient Active Problem List   Diagnosis Date Noted  . Allergy to statin medication 12/01/2019  . History of below knee amputation, left ([place]) 07/30/2019  . Gangrene of left foot ([place]) 05/16/2019  . Subacute osteomyelitis of left foot ([place])   . Charcot's joint of foot, left   . Post-operative state   . PAD (peripheral artery disease) ([place]) 12/05/2018  . Osteomyelitis of ankle or foot, acute, left ([place]) 12/05/2018  . MRSA bacteremia 12/05/2018  . Sepsis without acute organ dysfunction ([place])   . Charcot's joint of foot due to diabetes ([place]) 09/18/2018  . BMI 36.0-36.9,adult   . Morbid obesity ([place]) 11/19/2017  . B12 deficiency 11/01/2017  . Diabetic foot ulcer ([place]) 12/14/2016  . Diabetic polyneuropathy associated with type 2 diabetes mellitus ([place]) 12/13/2015  . Lumbar stenosis with neurogenic claudication 09/02/2015  .  Chronic constipation 07/13/2015  . OSA (obstructive sleep apnea) 06/25/2015  . 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/23/2015  . Hypertriglyceridemia 04/23/2015  . Asthma, mild intermittent 02/01/2015  . Type 2 diabetes mellitus with diabetic nephropathy ([place]) 12/31/2014  . Gastroesophageal reflux disease with esophagitis 12/31/2014  . Dysphagia 12/31/2014  . Bilateral carpal tunnel syndrome 12/01/2014  . Diverticulosis of colon (without mention of hemorrhage) 11/30/2012  . Essential hypertension, benign 11/28/2012    Past Surgical History:  Procedure Laterality Date  . ABDOMINAL HYSTERECTOMY    . AMPUTATION Left 12/06/2018   Procedure: PARTIAL AMPUTATION LEFT FOOT;  Surgeon: Edrick Kins, DPM;  Location: [place];  Service: Podiatry;  Laterality: Left;  . AMPUTATION Left 05/16/2019   Procedure: LEFT BELOW KNEE AMPUTATION;  Surgeon: Newt Minion, MD;  Location: [place];  Service: Orthopedics;  Laterality: Left;  . BACK SURGERY  09/02/2015  . BONE BIOPSY Left 12/06/2018   Procedure: Bone Biopsy;  Surgeon: Edrick Kins, DPM;  Location: [place];  Service: Podiatry;  Laterality: Left;  . CERVICAL FUSION    . CESAREAN SECTION  1986  . COLONOSCOPY N/A 11/30/2012   Procedure: COLONOSCOPY;  Surgeon: Irene Shipper, MD;  Location: [facility] ENDOSCOPY;  Service:  Endoscopy;  Laterality: N/A;  . COLONOSCOPY WITH PROPOFOL N/A 01/03/2016   Procedure: COLONOSCOPY WITH PROPOFOL;  Surgeon: Manya Silvas, MD;  Location: [facility] ENDOSCOPY;  Service: Endoscopy;  Laterality: N/A;  . ESOPHAGOGASTRODUODENOSCOPY (EGD) WITH PROPOFOL N/A 01/14/2015   Procedure: ESOPHAGOGASTRODUODENOSCOPY (EGD) WITH PROPOFOL;  Surgeon: Manya Silvas, MD;  Location[facility] END[facility]COPY;  Service: Endoscopy;  Laterality: N/A;  . IRRIGATION AND DEBRIDEMENT FOOT Left 03/13/2018   Procedure: IRRIGATION AND DEBRIDEMENT FOOT WITH BONE BIOPSY WITH MISONIX DEBRIDER;  Surgeon: Evelina Bucy, DPM;  Location: [place];  Service: Podiatry;   Laterality: Left;  . IRRIGATION AND DEBRIDEMENT FOOT Left 12/06/2018   Procedure: Irrigation And Debridement Foot;  Surgeon: Edrick Kins, DPM;  Location: [place];  Service: Podiatry;  Laterality: Left;  . LEG AMPUTATION BELOW KNEE Left   . LUMBAR WOUND DEBRIDEMENT N/A 10/01/2015   Procedure: LUMBAR WOUND DEBRIDEMENT;  Surgeon: Ashok Pall, MD;  Location: [place] NEURO ORS;  Service: Neurosurgery;  Laterality: N/A;  LUMBAR WOUND DEBRIDEMENT  . NASAL SINUS SURGERY    . SAVORY DILATION N/A 01/14/2015   Procedure: SAVORY DILATION;  Surgeon: Manya Silvas, MD;  Location: [facility] ENDOSCOPY;  Service: Endoscopy;  Laterality: N/A;  . TONSILLECTOMY    . WISDOM TOOTH EXTRACTION      Family History  Problem Relation Age of Onset  . Lung cancer Father   . Hypertension Father   . Arthritis Father   . Other Mother        hardening of the arteries/renal cell carcenoma  . Hypertension Mother   . Stroke Mother   . Kidney cancer Mother   . Arthritis Brother   . Rheum arthritis Maternal Uncle   . Bladder Cancer Neg Hx     Social History   Tobacco Use  . Smoking status: Never Smoker  . Smokeless tobacco: Never Used  Substance Use Topics  . Alcohol use: Never     Current Outpatient Medications:  .  acetaminophen (TYLENOL) 500 MG tablet, Take 1,000 mg by mouth every 6 (six) hours as needed for mild pain. Maximum of 3,000 mg per day, Disp: , Rfl:  .  Albuterol Sulfate (PROAIR RESPICLICK) 737 (90 Base) MCG/ACT AEPB, Inhale 1-2 puffs into the lungs every 6 (six) hours as needed (Shortness of breath or wheezing)., Disp: 1 each, Rfl: 2 .  allopurinol (ZYLOPRIM) 100 MG tablet, Take 1 tablet (100 mg total) by mouth 2 (two) times daily., Disp: 60 tablet, Rfl: 3 .  aspirin EC 325 MG tablet, Take 1 tablet (325 mg total) by mouth daily., Disp: 30 tablet, Rfl: 0 .  Blood Glucose Monitoring Suppl (CONTOUR NEXT MONITOR) w/Device KIT, as directed. , Disp: , Rfl:  .  Chlorphen-Phenyleph-ASA (ALKA-SELTZER PLUS COLD  PO), Take 2 tablets by mouth as needed (allergies). Day and night time, Disp: , Rfl:  .  Cholecalciferol 25 MCG (1000 UT) tablet, Take by mouth., Disp: , Rfl:  .  Colchicine 0.6 MG CAPS, Take 0.6 mg by mouth 2 (two) times daily as needed., Disp: 60 capsule, Rfl: 1 .  cyanocobalamin 1000 MCG tablet, Take by mouth., Disp: , Rfl:  .  DULoxetine (CYMBALTA) 60 MG capsule, Take by mouth 2 (two) times daily., Disp: , Rfl:  .  fenofibrate (TRICOR) 145 MG tablet, Take 1 tablet (145 mg total) by mouth daily., Disp: 90 tablet, Rfl: 3 .  gabapentin (NEURONTIN) 100 MG capsule, Take by mouth., Disp: , Rfl:  .  gabapentin (NEURONTIN) 400 MG capsule, Take 400  mg by mouth 3 (three) times daily., Disp: , Rfl:  .  glimepiride (AMARYL) 2 MG tablet, Take 2 mg by mouth 2 (two) times daily. , Disp: , Rfl:  .  glucose blood (PRECISION QID TEST) test strip, 1 each (1 strip total) by XX route once daily Use as instructed., Disp: , Rfl:  .  hydrochlorothiazide (HYDRODIURIL) 12.5 MG tablet, Take 1 tablet (12.5 mg total) by mouth daily., Disp: 90 tablet, Rfl: 3 .  Incontinence Supply Disposable (ASSURANCE FITTED BRIEF LARGE) MISC, Use 1 brief up to 6 times a day as needed for urinary incontinence, Disp: 180 each, Rfl: 5 .  insulin degludec (TRESIBA) 200 UNIT/ML FlexTouch Pen, Inject 20 Units into the skin at bedtime. , Disp: , Rfl:  .  metFORMIN (GLUCOPHAGE-XR) 500 MG 24 hr tablet, Take 500 mg by mouth 3 (three) times daily. , Disp: , Rfl:  .  mirabegron ER (MYRBETRIQ) 50 MG TB24 tablet, Take 1 tablet (50 mg total) by mouth daily., Disp: 30 tablet, Rfl: 11 .  pantoprazole (PROTONIX) 40 MG tablet, Take 1 tablet (40 mg total) by mouth daily as needed (Heartburn)., Disp: 90 tablet, Rfl: 2 .  ULTICARE SHORT PEN NEEDLES 31G X 8 MM MISC, , Disp: , Rfl:  .  Vitamin D, Ergocalciferol, (DRISDOL) 1.25 MG (50000 UNIT) CAPS capsule, Take by mouth., Disp: , Rfl:   Allergies  Allergen Reactions  . Eggs Or Egg-Derived Products Swelling  and Other (See Comments)    Angioedema  . Atorvastatin Other (See Comments)    Liver toxicity  . Influenza Virus Vaccine Swelling    Arm swelled (site of injection)  . Latex Itching  . Pravastatin Itching and Rash  . Tape Rash and Other (See Comments)    Adhesive on foot pull off skin.  Ok to use paper tape.    With staff assistance, above reviewed with the patient today.  ROS: As per HPI, otherwise no specific complaints on a limited and focused system review   Objective  Virtual encounter, vitals not obtained.  There is no height or weight on file to calculate BMI.  Physical Exam   Appears in NAD via conversation, very pleasant Pulmonary/Chest: No obvious respiratory distress. Speaking in complete sentences Neurological: Pt is alert, Speech is normal Psychiatric: Patient has a normal mood and affect, behavior is normal. Judgment and thought content normal.   No results found for this or any previous visit (from the past 72 hour(s)).  PHQ2/9: Depression screen [facility] 2/9 06/09/2020 12/19/2019 09/30/2019 08/21/2019 07/24/2019  Decreased Interest 0 0 0 0 0  Down, Depressed, Hopeless 0 0 0 0 0  PHQ - 2 Score 0 0 0 0 0  Altered sleeping - 0 0 - 0  Tired, decreased energy - 0 0 - 0  Change in appetite - 0 0 - 0  Feeling bad or failure about yourself  - 0 0 - 0  Trouble concentrating - 0 0 - 0  Moving slowly or fidgety/restless - 0 0 - 0  Suicidal thoughts - 0 0 - 0  PHQ-9 Score - 0 0 - 0  Difficult doing work/chores - - Not difficult at all - Not difficult at all  Some recent data might be hidden   PHQ-2/9 Result reviewed  Fall Risk: Fall Risk  06/09/2020 12/19/2019 09/30/2019 08/21/2019 07/24/2019  Falls in the past year? 0 _0 Number falls in past yr: 0 1 1 - 1  Injury with Fall? 0 0  _0 Risk for fall due to : - History of fall(s);Impaired balance/gait;Impaired mobility History of fall(s);Impaired balance/gait;Impaired mobility;Orthopedic patient Impaired  mobility;Orthopedic patient;Impaired balance/gait -  Follow up - - - Falls prevention discussed -     Assessment & Plan  1. Acute right-sided low back pain with right-sided sciatica 2. History of back surgery 3. Chronic low back pain, unspecified back pain laterality, unspecified whether sciatica present There are marked marked limitations noted with this being a phone visit. The patient with acute low back pain on top of chronic back pain issues previously.  She had prior surgery years ago (2017) for lumbar disc disease, and also had prior disc disease in the cervical spine with surgery in 2015 for this.  She is also followed by neurology currently for neuropathy concerns. She notes her symptoms worsened soon after she had done more activities than usual, using her crutches with arm supports to help with more walking as she is status post BKA of the left lower extremity, and do feel more stresses were put on the back as a result.  Could be a possible source to a strain/spasm component.  She has applied ice topically and rested, and symptoms have improved. Felt best to proceed as follows: Can change to a more contrast therapy approach with applying some warm compresses or letting warm water in the shower run over the back, and then doing some gentle range of motion exercises after the area is warm.  Then should apply ice topically to help lessen inflammation. Can use a Tylenol product as needed in the short-term for increased pain. Continue with relative rest, and do think using a walker presently is helping. As symptoms further improved, can gradually start to increase activities again and assess her response. If symptoms not improving further or worsening again, follow-up as needed, and likely will need a referral to the surgeons again to help reassess. She is very much in agreement with this approach.  4. Diabetic polyneuropathy associated with type 2 diabetes mellitus ([place]) 5. S/P BKA  (below knee amputation), left ([place]) As above noted, does see the neurologist presently who has been making medication adjustments in her regimen, and she is status post BKA.  Did recommend she follow-up with her PCP, Delsa Grana in the next 2 to 3 weeks to help reassess, and can follow-up sooner as needed as noted above.  I discussed the assessment and treatment plan with the patient. The patient was provided an opportunity to ask questions and all were answered. The patient agreed with the plan and demonstrated an understanding of the instructions.  Red flags and when to present for emergency care or RTC including fever >101.80F, chest pain, shortness of breath, new/worsening/un-resolving symptoms reviewed with patient at time of visit.   The patient was advised to call back or seek an in-person evaluation if the symptoms worsen or if the condition fails to improve as anticipated.  I provided 25 minutes of non-face-to-face time during this encounter that included discussing at length patient's sx/history, pertinent pmhx, medications, treatment and follow up plan. This time also included the necessary documentation, orders, and chart review.  Towanda Malkin, MD

## 2020-06-08 NOTE — Telephone Encounter (Signed)
Left message for  patient she has appointment 1/5. Inform her to go to [facility] or ED if any issues come up prior to appointment

## 2020-06-09 ENCOUNTER — Ambulatory Visit (INDEPENDENT_AMBULATORY_CARE_PROVIDER_SITE_OTHER): Payer: Medicare Other | Admitting: Internal Medicine

## 2020-06-09 ENCOUNTER — Encounter: Payer: Self-pay | Admitting: Internal Medicine

## 2020-06-09 DIAGNOSIS — Z89512 Acquired absence of left leg below knee: Secondary | ICD-10-CM | POA: Diagnosis not present

## 2020-06-09 DIAGNOSIS — G8929 Other chronic pain: Secondary | ICD-10-CM

## 2020-06-09 DIAGNOSIS — E1142 Type 2 diabetes mellitus with diabetic polyneuropathy: Secondary | ICD-10-CM

## 2020-06-09 DIAGNOSIS — Z9889 Other specified postprocedural states: Secondary | ICD-10-CM

## 2020-06-09 DIAGNOSIS — M545 Low back pain, unspecified: Secondary | ICD-10-CM

## 2020-06-09 DIAGNOSIS — M5441 Lumbago with sciatica, right side: Secondary | ICD-10-CM | POA: Diagnosis not present

## 2020-06-22 ENCOUNTER — Telehealth (INDEPENDENT_AMBULATORY_CARE_PROVIDER_SITE_OTHER): Payer: Medicare Other | Admitting: Family Medicine

## 2020-06-22 ENCOUNTER — Encounter: Payer: Self-pay | Admitting: Family Medicine

## 2020-06-22 VITALS — BP 147/73 | HR 93 | Ht 60.0 in | Wt 199.0 lb

## 2020-06-22 DIAGNOSIS — M5441 Lumbago with sciatica, right side: Secondary | ICD-10-CM

## 2020-06-22 MED ORDER — PREDNISONE 20 MG PO TABS: 40.0000 mg | ORAL_TABLET | Freq: Every day | ORAL | 0 refills | Status: AC

## 2020-06-22 MED ORDER — TIZANIDINE HCL 4 MG PO TABS
2.0000 mg | ORAL_TABLET | Freq: Three times a day (TID) | ORAL | 0 refills | Status: DC | PRN
Start: 2020-06-22 — End: 2020-09-09

## 2020-06-22 NOTE — Progress Notes (Signed)
Name: Olivia Romero   MRN: [id number]    DOB: 05/21/1956   Date:06/22/2020       Progress Note  Subjective:    Chief Complaint  Chief Complaint  Patient presents with  . Sciatica    Radiates down right side down butt area, got better but now worse again    I connected with  [name] on 06/22/20 at  1:40 PM EST by telephone and verified that I am speaking with the correct person using two identifiers.   I discussed the limitations, risks, security and privacy concerns of performing an evaluation and management service by telephone and the availability of in person appointments. Staff also discussed with the patient that there may be a patient responsible charge related to this service.  Patient verbalized understanding and agreed to proceed with encounter. Patient Location: home Provider Location: [facility] Additional Individuals present: noe  HPI  Pt with low back/buttock pain  Waking her from sleep some night, onset 2-3 weeks ago with doing her exercises - she did more than her normal walking exercises Severe associated with spasms Pain ranges 5-10/10  Trying heat/ice and did a visit with Dr. Roxan Hockey.  [facility] [place] - hx of back surgery - 5 years ago, pain is reminding her of when pain is severe back in 2017   Patient Active Problem List   Diagnosis Date Noted  . Allergy to statin medication 12/01/2019  . History of below knee amputation, left ([place]) 07/30/2019  . Gangrene of left foot ([place]) 05/16/2019  . Subacute osteomyelitis of left foot ([place])   . Charcot's joint of foot, left   . Post-operative state   . PAD (peripheral artery disease) ([place]) 12/05/2018  . Osteomyelitis of ankle or foot, acute, left ([place]) 12/05/2018  . MRSA bacteremia 12/05/2018  . Sepsis without acute organ dysfunction ([place])   . Charcot's joint of foot due to diabetes ([place]) 09/18/2018  . BMI 36.0-36.9,adult   . Morbid obesity ([place]) 11/19/2017  . B12 deficiency 11/01/2017   . Diabetic foot ulcer ([place]) 12/14/2016  . Diabetic polyneuropathy associated with type 2 diabetes mellitus ([place]) 12/13/2015  . Lumbar stenosis with neurogenic claudication 09/02/2015  . Chronic constipation 07/13/2015  . OSA (obstructive sleep apnea) 06/25/2015  . 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/23/2015  . Hypertriglyceridemia 04/23/2015  . Asthma, mild intermittent 02/01/2015  . Type 2 diabetes mellitus with diabetic nephropathy ([place]) 12/31/2014  . Gastroesophageal reflux disease with esophagitis 12/31/2014  . Dysphagia 12/31/2014  . Bilateral carpal tunnel syndrome 12/01/2014  . Diverticulosis of colon (without mention of hemorrhage) 11/30/2012  . Essential hypertension, benign 11/28/2012    Social History   Tobacco Use  . Smoking status: Never Smoker  . Smokeless tobacco: Never Used  Substance Use Topics  . Alcohol use: Never     Current Outpatient Medications:  .  acetaminophen (TYLENOL) 500 MG tablet, Take 1,000 mg by mouth every 6 (six) hours as needed for mild pain. Maximum of 3,000 mg per day, Disp: , Rfl:  .  Albuterol Sulfate (PROAIR RESPICLICK) 188 (90 Base) MCG/ACT AEPB, Inhale 1-2 puffs into the lungs every 6 (six) hours as needed (Shortness of breath or wheezing)., Disp: 1 each, Rfl: 2 .  allopurinol (ZYLOPRIM) 100 MG tablet, Take 1 tablet (100 mg total) by mouth 2 (two) times daily., Disp: 60 tablet, Rfl: 3 .  aspirin EC 325 MG tablet, Take 1 tablet (325 mg total) by mouth daily., Disp: 30 tablet, Rfl: 0 .  Blood Glucose Monitoring Suppl (CONTOUR NEXT MONITOR) w/Device KIT, as directed. , Disp: , Rfl:  .  Chlorphen-Phenyleph-ASA (ALKA-SELTZER PLUS COLD PO), Take 2 tablets by mouth as needed (allergies). Day and night time, Disp: , Rfl:  .  Cholecalciferol 25 MCG (1000 UT) tablet, Take by mouth., Disp: , Rfl:  .  Colchicine 0.6 MG CAPS, Take 0.6 mg by mouth 2 (two) times daily as needed., Disp: 60 capsule, Rfl: 1 .  cyanocobalamin 1000 MCG  tablet, Take by mouth., Disp: , Rfl:  .  DULoxetine (CYMBALTA) 60 MG capsule, Take by mouth 2 (two) times daily., Disp: , Rfl:  .  fenofibrate (TRICOR) 145 MG tablet, Take 1 tablet (145 mg total) by mouth daily., Disp: 90 tablet, Rfl: 3 .  gabapentin (NEURONTIN) 100 MG capsule, Take by mouth., Disp: , Rfl:  .  gabapentin (NEURONTIN) 400 MG capsule, Take 400 mg by mouth 4 (four) times daily., Disp: , Rfl:  .  glimepiride (AMARYL) 2 MG tablet, Take 2 mg by mouth 2 (two) times daily. , Disp: , Rfl:  .  glucose blood (PRECISION QID TEST) test strip, 1 each (1 strip total) by XX route once daily Use as instructed., Disp: , Rfl:  .  hydrochlorothiazide (HYDRODIURIL) 12.5 MG tablet, Take 1 tablet (12.5 mg total) by mouth daily., Disp: 90 tablet, Rfl: 3 .  Incontinence Supply Disposable (ASSURANCE FITTED BRIEF LARGE) MISC, Use 1 brief up to 6 times a day as needed for urinary incontinence, Disp: 180 each, Rfl: 5 .  insulin degludec (TRESIBA) 200 UNIT/ML FlexTouch Pen, Inject 20 Units into the skin at bedtime. , Disp: , Rfl:  .  metFORMIN (GLUCOPHAGE-XR) 500 MG 24 hr tablet, Take 500 mg by mouth 3 (three) times daily. , Disp: , Rfl:  .  mirabegron ER (MYRBETRIQ) 50 MG TB24 tablet, Take 1 tablet (50 mg total) by mouth daily., Disp: 30 tablet, Rfl: 11 .  pantoprazole (PROTONIX) 40 MG tablet, Take 1 tablet (40 mg total) by mouth daily as needed (Heartburn)., Disp: 90 tablet, Rfl: 2 .  predniSONE (DELTASONE) 20 MG tablet, Take 2 tablets (40 mg total) by mouth daily with breakfast for 5 days., Disp: 10 tablet, Rfl: 0 .  tiZANidine (ZANAFLEX) 4 MG tablet, Take 0.5-1.5 tablets (2-6 mg total) by mouth every 8 (eight) hours as needed for muscle spasms (muscle tightness)., Disp: 90 tablet, Rfl: 0 .  ULTICARE SHORT PEN NEEDLES 31G X 8 MM MISC, , Disp: , Rfl:  .  Vitamin D, Ergocalciferol, (DRISDOL) 1.25 MG (50000 UNIT) CAPS capsule, Take by mouth., Disp: , Rfl:   Allergies  Allergen Reactions  . Eggs Or Egg-Derived  Products Swelling and Other (See Comments)    Angioedema  . Atorvastatin Other (See Comments)    Liver toxicity  . Influenza Virus Vaccine Swelling    Arm swelled (site of injection)  . Latex Itching  . Pravastatin Itching and Rash  . Tape Rash and Other (See Comments)    Adhesive on foot pull off skin.  Ok to use paper tape.    Chart Review: I personally reviewed active problem list, medication list, allergies, family history, social history, health maintenance, notes from last encounter, lab results, imaging with the patient/caregiver today.   Review of Systems 10 Systems reviewed and are negative for acute change except as noted in the HPI.   Objective:    Virtual encounter, vitals limited, only able to obtain the following Today's Vitals   06/22/20 1310  BP: (!) 147/73  Pulse: 93  Weight: 199 lb (90.3 kg)  Height: 5' (1.524 m)   Body mass index is 38.86 kg/m. Nursing Note and Vital Signs reviewed.  Physical Exam Phonation clear, pt pleasant, alert PE limited by telephone encounter  No results found for this or any previous visit (from the past 72 hour(s)).  Assessment and Plan:     ICD-10-CM   1. Acute right-sided low back pain with right-sided sciatica  M54.41 tiZANidine (ZANAFLEX) 4 MG tablet    predniSONE (DELTASONE) 20 MG tablet   acute on chronic, suspect a flare, tx with steroids, muscle relaxers, NSAIDs and tylenol with other conservative tx, f/up in 3-4 weeks, f/up spine if not better    -Red flags and when to present for emergency care or RTC including but not limited to new/worsening/un-resolving symptoms,  reviewed with patient at time of visit. Follow up and care instructions discussed and provided in AVS. - I discussed the assessment and treatment plan with the patient. The patient was provided an opportunity to ask questions and all were answered. The patient agreed with the plan and demonstrated an understanding of the instructions.  - The patient  was advised to call back or seek an in-person evaluation if the symptoms worsen or if the condition fails to improve as anticipated.  I provided 20+ minutes of non-face-to-face time during this encounter.  Delsa Grana, PA-C 06/22/20 2:10 PM

## 2020-06-29 ENCOUNTER — Ambulatory Visit: Payer: Medicare Other | Admitting: Family Medicine

## 2020-07-01 LAB — HEMOGLOBIN A1C: Hemoglobin A1C: 7.5

## 2020-07-12 ENCOUNTER — Other Ambulatory Visit: Payer: Self-pay

## 2020-07-12 ENCOUNTER — Ambulatory Visit (INDEPENDENT_AMBULATORY_CARE_PROVIDER_SITE_OTHER): Payer: Medicare Other | Admitting: Family Medicine

## 2020-07-12 ENCOUNTER — Encounter: Payer: Self-pay | Admitting: Family Medicine

## 2020-07-12 VITALS — BP 120/82 | Temp 98.5°F | Resp 16 | Ht 60.0 in | Wt 190.5 lb

## 2020-07-12 DIAGNOSIS — T466X5A Adverse effect of antihyperlipidemic and antiarteriosclerotic drugs, initial encounter: Secondary | ICD-10-CM

## 2020-07-12 DIAGNOSIS — J4520 Mild intermittent asthma, uncomplicated: Secondary | ICD-10-CM

## 2020-07-12 DIAGNOSIS — G8929 Other chronic pain: Secondary | ICD-10-CM

## 2020-07-12 DIAGNOSIS — I739 Peripheral vascular disease, unspecified: Secondary | ICD-10-CM | POA: Diagnosis not present

## 2020-07-12 DIAGNOSIS — Z794 Long term (current) use of insulin: Secondary | ICD-10-CM

## 2020-07-12 DIAGNOSIS — Z1159 Encounter for screening for other viral diseases: Secondary | ICD-10-CM

## 2020-07-12 DIAGNOSIS — E538 Deficiency of other specified B group vitamins: Secondary | ICD-10-CM

## 2020-07-12 DIAGNOSIS — E781 Pure hyperglyceridemia: Secondary | ICD-10-CM

## 2020-07-12 DIAGNOSIS — M545 Low back pain, unspecified: Secondary | ICD-10-CM

## 2020-07-12 DIAGNOSIS — E785 Hyperlipidemia, unspecified: Secondary | ICD-10-CM

## 2020-07-12 DIAGNOSIS — K2100 Gastro-esophageal reflux disease with esophagitis, without bleeding: Secondary | ICD-10-CM | POA: Diagnosis not present

## 2020-07-12 DIAGNOSIS — K719 Toxic liver disease, unspecified: Secondary | ICD-10-CM

## 2020-07-12 DIAGNOSIS — I10 Essential (primary) hypertension: Secondary | ICD-10-CM | POA: Diagnosis not present

## 2020-07-12 DIAGNOSIS — Z5181 Encounter for therapeutic drug level monitoring: Secondary | ICD-10-CM

## 2020-07-12 DIAGNOSIS — M5441 Lumbago with sciatica, right side: Secondary | ICD-10-CM

## 2020-07-12 DIAGNOSIS — Z6837 Body mass index (BMI) 37.0-37.9, adult: Secondary | ICD-10-CM

## 2020-07-12 DIAGNOSIS — E1121 Type 2 diabetes mellitus with diabetic nephropathy: Secondary | ICD-10-CM

## 2020-07-12 DIAGNOSIS — E559 Vitamin D deficiency, unspecified: Secondary | ICD-10-CM

## 2020-07-12 MED ORDER — VITAMIN D (ERGOCALCIFEROL) 1.25 MG (50000 UNIT) PO CAPS: 50000.0000 [IU] | ORAL_CAPSULE | ORAL | 0 refills | Status: DC

## 2020-07-12 NOTE — Progress Notes (Signed)
Name: Olivia Romero   MRN: [id number]    DOB: 1956/02/11   Date:07/12/2020       Progress Note  Chief Complaint  Patient presents with  . Back Pain     Subjective:   Olivia Romero is a 65 y.o. female, presents to clinic for f/up on back pain and routine f/up as well  F/up on low back pain with sciatica -acute on chronic Virtual visit a few weeks ago, for back pain, clinically sounded like sciatica - muscle relaxer helped a little, but still worse than normal She did steroids and muscle relaxers - changed to tylenol arthritis - that helps some She has hx of back surgery, still very sx, pain not resolved - she needs to get back with specialists - requests refill on zanaflex Referral - Dr. Christella Noa - she previously saw him - would like to get back for worsening right low back pain and sciatica x several months  DM - IDDM, uncontrolled - per [facility] endo - last A1C done at [facility] 7.3 - on insulin, metformin glimepiride? Neuropathy - managed by endo and neuro - on cymbalta, gabapentin  Hypertension:  Currently managed on HCTZ Pt reports good med compliance and denies any SE.   Blood pressure today is well controlled. BP Readings from Last 3 Encounters:  07/12/20 120/82  06/22/20 (!) 147/73  04/26/20 (!) 141/79   Pt denies CP, SOB, exertional sx, LE edema, palpitation, Ha's, visual disturbances, lightheadedness, hypotension, syncope. Dietary efforts for BP?  Trying to be healthy   Hyperlipidemia:  Statin allergy on fenofibrate - liver toxicity - added to Dx, has been on allergy list Last Lipids:  due Lab Results  Component Value Date   CHOL 296 (H) 09/20/2016   HDL 34 (L) 09/20/2016   LDLCALC NOT CALC 09/20/2016   TRIG 886 (H) 09/20/2016   CHOLHDL 8.7 (H) 09/20/2016   - Denies: Chest pain, shortness of breath, myalgias, claudication  B12 and Vit D deficiency  Asthma - currently well controlled, not using rescue inhaler much  GERD:  Doing well, currently well  controlled with diet/OTC meds   Current Outpatient Medications:  .  acetaminophen (TYLENOL) 500 MG tablet, Take 1,000 mg by mouth every 6 (six) hours as needed for mild pain. Maximum of 3,000 mg per day, Disp: , Rfl:  .  Albuterol Sulfate (PROAIR RESPICLICK) 797 (90 Base) MCG/ACT AEPB, Inhale 1-2 puffs into the lungs every 6 (six) hours as needed (Shortness of breath or wheezing)., Disp: 1 each, Rfl: 2 .  allopurinol (ZYLOPRIM) 100 MG tablet, Take 1 tablet (100 mg total) by mouth 2 (two) times daily., Disp: 60 tablet, Rfl: 3 .  aspirin EC 325 MG tablet, Take 1 tablet (325 mg total) by mouth daily., Disp: 30 tablet, Rfl: 0 .  Cholecalciferol 25 MCG (1000 UT) tablet, Take by mouth., Disp: , Rfl:  .  Colchicine 0.6 MG CAPS, Take 0.6 mg by mouth 2 (two) times daily as needed., Disp: 60 capsule, Rfl: 1 .  cyanocobalamin 1000 MCG tablet, Take by mouth., Disp: , Rfl:  .  fenofibrate (TRICOR) 145 MG tablet, Take 1 tablet (145 mg total) by mouth daily., Disp: 90 tablet, Rfl: 3 .  gabapentin (NEURONTIN) 100 MG capsule, Take by mouth., Disp: , Rfl:  .  gabapentin (NEURONTIN) 400 MG capsule, Take 400 mg by mouth 4 (four) times daily., Disp: , Rfl:  .  glimepiride (AMARYL) 2 MG tablet, Take 2 mg by mouth 2 (two) times daily. ,  Disp: , Rfl:  .  hydrochlorothiazide (HYDRODIURIL) 12.5 MG tablet, Take 1 tablet (12.5 mg total) by mouth daily., Disp: 90 tablet, Rfl: 3 .  insulin degludec (TRESIBA) 200 UNIT/ML FlexTouch Pen, Inject 20 Units into the skin at bedtime. , Disp: , Rfl:  .  metFORMIN (GLUCOPHAGE-XR) 500 MG 24 hr tablet, Take 500 mg by mouth 3 (three) times daily. , Disp: , Rfl:  .  mirabegron ER (MYRBETRIQ) 50 MG TB24 tablet, Take 1 tablet (50 mg total) by mouth daily., Disp: 30 tablet, Rfl: 11 .  pantoprazole (PROTONIX) 40 MG tablet, Take 1 tablet (40 mg total) by mouth daily as needed (Heartburn)., Disp: 90 tablet, Rfl: 2 .  tiZANidine (ZANAFLEX) 4 MG tablet, Take 0.5-1.5 tablets (2-6 mg total) by mouth  every 8 (eight) hours as needed for muscle spasms (muscle tightness)., Disp: 90 tablet, Rfl: 0 .  Blood Glucose Monitoring Suppl (CONTOUR NEXT MONITOR) w/Device KIT, as directed.  (Patient not taking: Reported on 07/12/2020), Disp: , Rfl:  .  Chlorphen-Phenyleph-ASA (ALKA-SELTZER PLUS COLD PO), Take 2 tablets by mouth as needed (allergies). Day and night time (Patient not taking: Reported on 07/12/2020), Disp: , Rfl:  .  glucose blood (PRECISION QID TEST) test strip, 1 each (1 strip total) by XX route once daily Use as instructed. (Patient not taking: Reported on 07/12/2020), Disp: , Rfl:  .  Incontinence Supply Disposable (ASSURANCE FITTED BRIEF LARGE) MISC, Use 1 brief up to 6 times a day as needed for urinary incontinence (Patient not taking: Reported on 07/12/2020), Disp: 180 each, Rfl: 5 .  ULTICARE SHORT PEN NEEDLES 31G X 8 MM MISC, , Disp: , Rfl:  .  Vitamin D, Ergocalciferol, (DRISDOL) 1.25 MG (50000 UNIT) CAPS capsule, Take by mouth. (Patient not taking: Reported on 07/12/2020), Disp: , Rfl:   Patient Active Problem List   Diagnosis Date Noted  . Allergy to statin medication 12/01/2019  . History of below knee amputation, left ([place]) 07/30/2019  . Gangrene of left foot ([place]) 05/16/2019  . Subacute osteomyelitis of left foot ([place])   . Charcot's joint of foot, left   . Post-operative state   . PAD (peripheral artery disease) ([place]) 12/05/2018  . Osteomyelitis of ankle or foot, acute, left ([place]) 12/05/2018  . MRSA bacteremia 12/05/2018  . Sepsis without acute organ dysfunction ([place])   . 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 BMI 36.0-36.9,adult   . Morbid obesity ([place]) 11/19/2017  . B12 deficiency 11/01/2017  . Diabetic foot ulcer ([place]) 12/14/2016  . Diabetic polyneuropathy associated with type 2 diabetes mellitus ([place]) 12/13/2015  . Lumbar stenosis with neurogenic claudication 09/02/2015  . Chronic constipation 07/13/2015  . OSA (obstructive sleep apnea) 06/25/2015  .  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/23/2015  . Hypertriglyceridemia 04/23/2015  . Asthma, mild intermittent 02/01/2015  . Type 2 diabetes mellitus with diabetic nephropathy ([place]) 12/31/2014  . Gastroesophageal reflux disease with esophagitis 12/31/2014  . Dysphagia 12/31/2014  . Bilateral carpal tunnel syndrome 12/01/2014  . Diverticulosis of colon (without mention of hemorrhage) 11/30/2012  . Essential hypertension, benign 11/28/2012    Past Surgical History:  Procedure Laterality Date  . ABDOMINAL HYSTERECTOMY    . AMPUTATION Left 12/06/2018   Procedure: PARTIAL AMPUTATION LEFT FOOT;  Surgeon: Edrick Kins, DPM;  Location: [place];  Service: Podiatry;  Laterality: Left;  . AMPUTATION Left 05/16/2019   Procedure: LEFT BELOW KNEE AMPUTATION;  Surgeon: Newt Minion, MD;  Location: [place];  Service: Orthopedics;  Laterality: Left;  . BACK SURGERY  09/02/2015  . BONE BIOPSY Left 12/06/2018   Procedure: Bone Biopsy;  Surgeon: Edrick Kins, DPM;  Location: [place];  Service: Podiatry;  Laterality: Left;  . CERVICAL FUSION    . CESAREAN SECTION  1986  . COLONOSCOPY N/A 11/30/2012   Procedure: COLONOSCOPY;  Surgeon: Irene Shipper, MD;  Location: [facility] ENDOSCOPY;  Service: Endoscopy;  Laterality: N/A;  . COLONOSCOPY WITH PROPOFOL N/A 01/03/2016   Procedure: COLONOSCOPY WITH PROPOFOL;  Surgeon: Manya Silvas, MD;  Location[facility] ENDOSCOPY;  Service: Endoscopy;  Laterality: N/A;  . ESOPHAGOGASTRODUODENOSCOPY (EGD) WITH PROPOFOL N/A 01/14/2015   Procedure: ESOPHAGOGASTRODUODENOSCOPY (EGD) WITH PROPOFOL;  Surgeon: Manya Silvas, MD;  Location: [facility] ENDOSCOPY;  Service: Endoscopy;  Laterality: N/A;  . IRRIGATION AND DEBRIDEMENT FOOT Left 03/13/2018   Procedure: IRRIGATION AND DEBRIDEMENT FOOT WITH BONE BIOPSY WITH MISONIX DEBRIDER;  Surgeon: Evelina Bucy, DPM;  Location: [place];  Service: Podiatry;  Laterality: Left;  . IRRIGATION AND DEBRIDEMENT FOOT Left 12/06/2018   Procedure:  Irrigation And Debridement Foot;  Surgeon: Edrick Kins, DPM;  Location: [place];  Service: Podiatry;  Laterality: Left;  . LEG AMPUTATION BELOW KNEE Left   . LUMBAR WOUND DEBRIDEMENT N/A 10/01/2015   Procedure: LUMBAR WOUND DEBRIDEMENT;  Surgeon: Ashok Pall, MD;  Location: [place] [facility]URO [facility];  Service: Neurosurgery;  Laterality: N/A;  LUMBAR WOUND DEBRIDEMENT  . NASAL SINUS SURGERY    . SAVORY DILATION N/A 01/14/2015   Procedure: SAVORY DILATION;  Surgeon: Manya Silvas, MD;  Location: [facility] ENDOSCOPY;  Service: Endoscopy;  Laterality: N/A;  . TONSILLECTOMY    . Romero TOOTH EXTRACTION      Family History  Problem Relation Age of Onset  . Lung cancer Father   . Hypertension Father   . Arthritis Father   . Other Mother        hardening of the arteries/renal cell carcenoma  . Hypertension Mother   . Stroke Mother   . Kidney cancer Mother   . Arthritis Brother   . Rheum arthritis Maternal Uncle   . Bladder Cancer Neg Hx     Social History   Tobacco Use  . Smoking status: Never Smoker  . Smokeless tobacco: Never Used  Vaping Use  . Vaping Use: Never used  Substance Use Topics  . Alcohol use: Never  . Drug use: No     Allergies  Allergen Reactions  . Eggs Or Egg-Derived Products Swelling and Other (See Comments)    Angioedema  . Atorvastatin Other (See Comments)    Liver toxicity  . Influenza Virus Vaccine Swelling    Arm swelled (site of injection)  . Latex Itching  . Pravastatin Itching and Rash  . Tape Rash and Other (See Comments)    Adhesive on foot pull off skin.  Ok to use paper tape.    Health Maintenance  Topic Date Due  . Hepatitis C Screening  Never done  . MAMMOGRAM  10/17/2017  . FOOT EXAM  07/27/2019  . HEMOGLOBIN A1C  11/14/2019  . PAP SMEAR-Modifier  11/30/2019  . COVID-19 Vaccine (1) 07/28/2020 (Originally 11/18/1960)  . OPHTHALMOLOGY EXAM  08/12/2020  . TETANUS/TDAP  07/12/2025  . COLONOSCOPY (Pts 45-2yr Insurance coverage will need to be  confirmed)  01/02/2026  . PNEUMOCOCCAL POLYSACCHARIDE VACCINE AGE 71-64 HIGH RISK  Completed  . HIV Screening  Completed  . INFLUENZA VACCINE  Discontinued    Chart Review Today: I personally reviewed active problem  list, medication list, allergies, family history, social history, health maintenance, notes from last encounter, lab results, imaging with the patient/caregiver today.   Review of Systems  Constitutional: Negative.   HENT: Negative.   Eyes: Negative.   Respiratory: Negative.   Cardiovascular: Negative.   Gastrointestinal: Negative.   Endocrine: Negative.   Genitourinary: Negative.   Musculoskeletal: Negative.   Skin: Negative.   Allergic/Immunologic: Negative.   Neurological: Negative.   Hematological: Negative.   Psychiatric/Behavioral: Negative.   All other systems reviewed and are negative.    Objective:   Vitals:   07/12/20 1009  BP: 120/82  Resp: 16  Temp: 98.5 F (36.9 C)  TempSrc: Oral  SpO2: 98%  Weight: 190 lb 8 oz (86.4 kg)  Height: 5' (1.524 m)    Body mass index is 37.2 kg/m.  Physical Exam Vitals and nursing note reviewed.  Constitutional:      General: She is not in acute distress.    Appearance: Normal appearance. She is obese. She is not ill-appearing, toxic-appearing or diaphoretic.  HENT:     Head: Normocephalic and atraumatic.     Right Ear: External ear normal.     Left Ear: External ear normal.  Eyes:     General: No scleral icterus.       Right eye: No discharge.        Left eye: No discharge.     Conjunctiva/sclera: Conjunctivae normal.  Cardiovascular:     Rate and Rhythm: Normal rate and regular rhythm.     Pulses: Normal pulses.     Heart sounds: Normal heart sounds. No murmur heard. No friction rub. No gallop.   Pulmonary:     Effort: Pulmonary effort is normal. No respiratory distress.     Breath sounds: Normal breath sounds. No stridor. No wheezing, rhonchi or rales.  Abdominal:     General: Bowel sounds are  normal. There is no distension.     Palpations: Abdomen is soft.     Tenderness: There is no abdominal tenderness.  Musculoskeletal:     Comments: Left BKA with prosthesis   Skin:    General: Skin is warm and dry.     Capillary Refill: Capillary refill takes less than 2 seconds.     Coloration: Skin is not jaundiced or pale.     Findings: No rash.  Neurological:     Mental Status: She is alert.  Psychiatric:        Mood and Affect: Mood normal.        Behavior: Behavior normal.          Assessment & Plan:     ICD-10-CM   1. Essential hypertension, benign  Y22 COMPLETE METABOLIC PANEL WITH GFR   stable, well controlled, BP at goal today  2. PAD (peripheral artery disease) (HCC)  V36.1 COMPLETE METABOLIC PANEL WITH GFR    Lipid panel   not able to take statin, no new pain, atrophy, wounds to right leg  3. Type 2 diabetes mellitus with diabetic nephropathy, with long-term current use of insulin (HCC)  Q24.49 COMPLETE METABOLIC PANEL WITH GFR   Z79.4 Lipid panel    DULoxetine (CYMBALTA) 60 MG capsule    Lipid Pane with Rflx to Direct LDL, CardioIQ(R)   per endo - IDDM uncontrolled  4. Gastroesophageal reflux disease with esophagitis, unspecified whether hemorrhage  K21.00    stable, sx well controlled, no Rx, managing with diet/OTC meds  5. Mild intermittent asthma without complication  P53.00  stable well controlled  6. B12 deficiency  E53.8 CBC with Differential/Platelet   will screen cbc   7. Acute right-sided low back pain with right-sided sciatica  M54.41 Ambulatory referral to Spine Surgery   acute on chronic back pain, flared up several weeks ago, not much improvement with conservative management, past surgery, f/up referral to surgeon  8. Chronic low back pain, unspecified back pain laterality, unspecified whether sciatica present  M54.50 Ambulatory referral to Spine Surgery   G89.29    see above, ok to use muscle relaxers prn- discussed MOA, SE/RISKS, encouraged  stretching, NSAIDs, heat therapy - f/up spine specialist  9. Encounter for medication monitoring  T55.73 COMPLETE METABOLIC PANEL WITH GFR    CBC with Differential/Platelet    Lipid panel    Lipid Pane with Rflx to Direct LDL, CardioIQ(R)  10. Vitamin D deficiency  U20.2 COMPLETE METABOLIC PANEL WITH GFR    Vitamin D, Ergocalciferol, (DRISDOL) 1.25 MG (50000 UNIT) CAPS capsule   refill supplement and f/up  - will monitor calcium  11. Encounter for hepatitis C screening test for low risk patient  Z11.59 Hepatitis C antibody  12. Hypertriglyceridemia  R42.7 COMPLETE METABOLIC PANEL WITH GFR    Lipid panel    Lipid Pane with Rflx to Direct LDL, CardioIQ(R)   overdue for lipid panel  13. Hyperlipidemia, unspecified hyperlipidemia type  C62.3 COMPLETE METABOLIC PANEL WITH GFR    Lipid panel    Lipid Pane with Rflx to Direct LDL, CardioIQ(R)   overdue for lipid panel, statin allergy/liver toxicity, on fenofibrate - recheck labs  14. Hepatotoxicity due to statin drug  J62.8 COMPLETE METABOLIC PANEL WITH GFR   T46.6X5A   15. Class 2 severe obesity with serious comorbidity and body mass index (BMI) of 37.0 to 37.9 in adult, unspecified obesity type ([place])  E66.01    Z68.37    comorbidities IDDM, HTN, HLD, asthma - limited activity/mobility, work on diet efforts - nutritionist may be helpful for obesity/IDDM uncontrolled     Return in about 3 months (around 10/09/2020) for in office Vit D, HTN, other chronic conditions.   Delsa Grana, PA-C 07/12/20 10:43 AM

## 2020-07-13 ENCOUNTER — Other Ambulatory Visit: Payer: Self-pay | Admitting: Family Medicine

## 2020-07-13 DIAGNOSIS — Z1231 Encounter for screening mammogram for malignant neoplasm of breast: Secondary | ICD-10-CM

## 2020-07-13 LAB — CBC WITH DIFFERENTIAL/PLATELET
Absolute Monocytes: 479 cells/uL (ref 200–950)
Basophils Absolute: 53 cells/uL (ref 0–200)
Basophils Relative: 0.7 %
Eosinophils Absolute: 228 cells/uL (ref 15–500)
Eosinophils Relative: 3 %
HCT: 35.6 % (ref 35.0–45.0)
Hemoglobin: 11.3 g/dL — ABNORMAL LOW (ref 11.7–15.5)
Lymphs Abs: 2622 cells/uL (ref 850–3900)
MCH: 25.7 pg — ABNORMAL LOW (ref 27.0–33.0)
MCHC: 31.7 g/dL — ABNORMAL LOW (ref 32.0–36.0)
MCV: 80.9 fL (ref 80.0–100.0)
MPV: 11.5 fL (ref 7.5–12.5)
Monocytes Relative: 6.3 %
Neutro Abs: 4218 cells/uL (ref 1500–7800)
Neutrophils Relative %: 55.5 %
Platelets: 344 10*3/uL (ref 140–400)
RBC: 4.4 10*6/uL (ref 3.80–5.10)
RDW: 13.9 % (ref 11.0–15.0)
Total Lymphocyte: 34.5 %
WBC: 7.6 10*3/uL (ref 3.8–10.8)

## 2020-07-13 LAB — COMPLETE METABOLIC PANEL WITH GFR
AG Ratio: 1.3 (calc) (ref 1.0–2.5)
ALT: 18 U/L (ref 6–29)
AST: 14 U/L (ref 10–35)
Albumin: 3.9 g/dL (ref 3.6–5.1)
Alkaline phosphatase (APISO): 63 U/L (ref 37–153)
BUN: 14 mg/dL (ref 7–25)
CO2: 29 mmol/L (ref 20–32)
Calcium: 9.9 mg/dL (ref 8.6–10.4)
Chloride: 102 mmol/L (ref 98–110)
Creat: 0.56 mg/dL (ref 0.50–0.99)
GFR, Est African American: 116 mL/min/{1.73_m2} (ref >=60)
GFR, Est Non African American: 100 mL/min/{1.73_m2} (ref >=60)
Globulin: 3 g/dL (calc) (ref 1.9–3.7)
Glucose, Bld: 126 mg/dL — ABNORMAL HIGH (ref 65–99)
Potassium: 4.9 mmol/L (ref 3.5–5.3)
Sodium: 139 mmol/L (ref 135–146)
Total Bilirubin: 0.2 mg/dL (ref 0.2–1.2)
Total Protein: 6.9 g/dL (ref 6.1–8.1)

## 2020-07-13 LAB — HEPATITIS C ANTIBODY
Hepatitis C Ab: NONREACTIVE
SIGNAL TO CUT-OFF: 0.01 (ref <1.00)

## 2020-07-13 LAB — LIPID PANEL
Cholesterol: 252 mg/dL — ABNORMAL HIGH (ref <200)
HDL: 36 mg/dL — ABNORMAL LOW (ref >=50)
Non-HDL Cholesterol (Calc): 216 mg/dL (calc) — ABNORMAL HIGH (ref <130)
Total CHOL/HDL Ratio: 7 (calc) — ABNORMAL HIGH (ref <5.0)
Triglycerides: 718 mg/dL — ABNORMAL HIGH (ref <150)

## 2020-07-15 ENCOUNTER — Encounter: Payer: Self-pay | Admitting: Family Medicine

## 2020-07-15 ENCOUNTER — Telehealth: Payer: Self-pay

## 2020-07-15 DIAGNOSIS — K719 Toxic liver disease, unspecified: Secondary | ICD-10-CM | POA: Insufficient documentation

## 2020-07-15 DIAGNOSIS — T466X5A Adverse effect of antihyperlipidemic and antiarteriosclerotic drugs, initial encounter: Secondary | ICD-10-CM | POA: Insufficient documentation

## 2020-07-15 NOTE — Telephone Encounter (Signed)
Copied from [place] [phone]. Topic: Referral - Status >> Jul 15, 2020  3:18 PM Greggory Keen D wrote: Pt called checking on the status of the referral to [facility] .  She said she checked with them and they told her t has not been sent as of today.  They said it can be faxed to [phone]

## 2020-07-15 NOTE — Telephone Encounter (Signed)
Referral could not be sent until the note was completed. Leisa did not completed the note until 12:52pm today.  Referral has been processed and sent as requested.

## 2020-07-16 NOTE — Telephone Encounter (Signed)
Pt.notified

## 2020-07-19 ENCOUNTER — Other Ambulatory Visit: Payer: Self-pay

## 2020-07-19 ENCOUNTER — Ambulatory Visit: Payer: Self-pay | Admitting: *Deleted

## 2020-07-19 ENCOUNTER — Ambulatory Visit
Admission: EM | Admit: 2020-07-19 | Discharge: 2020-07-19 | Disposition: A | Discharge status: Home / Self Care | Payer: Medicare Other | Attending: Emergency Medicine | Admitting: Emergency Medicine

## 2020-07-19 DIAGNOSIS — R21 Rash and other nonspecific skin eruption: Secondary | ICD-10-CM | POA: Diagnosis not present

## 2020-07-19 MED ORDER — TRIAMCINOLONE ACETONIDE 0.1 % EX CREA: 1.0000 "application " | TOPICAL_CREAM | Freq: Two times a day (BID) | CUTANEOUS | 0 refills | Status: DC

## 2020-07-19 MED ORDER — CETIRIZINE HCL 10 MG PO TABS: 10.0000 mg | ORAL_TABLET | Freq: Every day | ORAL | 0 refills | Status: DC

## 2020-07-19 NOTE — ED Triage Notes (Signed)
Patient presents to Urgent Care with complaints of neck swelling and rash since Friday. Patient reports rash appeared on neck and has spread all over body. Treating irritation with cool compresses. She reports today she noticed some difficulty swallowing her fish oil pill.   Denies fever.

## 2020-07-19 NOTE — ED Provider Notes (Signed)
Roderic Palau    CSN: [id number] Arrival date & time: 07/19/20  1501      History   Chief Complaint Chief Complaint  Patient presents with  . Rash    HPI Olivia Romero is a 65 y.o. female.   The history is provided by the patient.  Rash Location:  Head/neck Head/neck rash location:  L neck and R neck Quality: itchiness and redness   Severity:  Moderate Onset quality:  Gradual Duration:  3 days Timing:  Constant Progression:  Unchanged Chronicity:  New Context: not animal contact, not chemical exposure, not exposure to similar rash, not food, not insect bite/sting, not medications, not new detergent/soap and not plant contact   Relieved by:  Moisturizers Worsened by:  Nothing Ineffective treatments:  None tried   Past Medical History:  Diagnosis Date  . Anemia   . Arthritis   . Asthma   . Colon polyps    adenomatous  . Diabetes mellitus without complication ([place])   . Diabetic infection of left foot ([place]) 12/2018  . Diverticulosis of colon   . Esophagitis   . GERD (gastroesophageal reflux disease)   . Hemorrhoid    internal  . Hyperlipemia   . Hypertension   . IBS (irritable bowel syndrome)    no current prob - diet controlled  . Myalgia due to statin 11/19/2017  . Neuropathy   . Neuropathy of both feet   . Neuropathy of hand   . Pneumonia    x 4  . PONV (postoperative nausea and vomiting)    on some surgeries but not all procedures  . Skin ulcer of right ankle, limited to breakdown of skin ([place]) 12/31/2016   resolved per patient 05/14/19  . Sleep apnea    has had in the past lost 50 pounds and do longer uses cpap  . Statin intolerance 04/23/2015  . Stroke [facility]) 2009   mini stroke per patient  . Stroke-like episode 2009   TIA - mini stroke per patient  . 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 Uncontrolled type 2 diabetes mellitus with hyperglycemia ([place]) 11/01/2017    Patient Active Problem List    Diagnosis Date Noted  . Hepatotoxicity due to statin drug 07/15/2020  . Vitamin D deficiency 07/12/2020  . Chronic low back pain 07/12/2020  . BMI 36.0-36.9,adult 12/29/2019  . Sepsis without acute organ dysfunction ([place]) 12/29/2019  . Post-operative state 12/29/2019  . Asthma 12/29/2019  . Cervical disc disorder 12/29/2019  . Displacement of cervical intervertebral disc 12/29/2019  . Allergy to statin medication 12/01/2019  . History of below knee amputation, left ([place]) 07/30/2019  . Gangrene of left foot ([place]) 05/16/2019  . Charcot's joint of foot, left   . PAD (peripheral artery disease) ([place]) 12/05/2018  . MRSA bacteremia 12/05/2018  . Charcot's joint of foot due to diabetes ([place]) 09/18/2018  . Class 2 severe obesity with serious comorbidity and body mass index (BMI) of 37.0 to 37.9 in adult ([place]) 11/19/2017  . B12 deficiency 11/01/2017  . Diabetic polyneuropathy associated with type 2 diabetes mellitus ([place]) 12/13/2015  . Lumbar stenosis with neurogenic claudication 09/02/2015  . Chronic constipation 07/13/2015  . OSA (obstructive sleep apnea) 06/25/2015  . 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/23/2015  . Hyperlipidemia 04/23/2015  . Asthma, mild intermittent 02/01/2015  . Type 2 diabetes mellitus with diabetic nephropathy ([place]) 12/31/2014  . Gastroesophageal reflux disease with esophagitis 12/31/2014  . Dysphagia 12/31/2014  . Bilateral carpal  tunnel syndrome 12/01/2014  . Diverticulosis of colon 11/30/2012  . Benign essential hypertension 11/28/2012    Past Surgical History:  Procedure Laterality Date  . ABDOMINAL HYSTERECTOMY    . AMPUTATION Left 12/06/2018   Procedure: PARTIAL AMPUTATION LEFT FOOT;  Surgeon: Edrick Kins, DPM;  Location: [place];  Service: Podiatry;  Laterality: Left;  . AMPUTATION Left 05/16/2019   Procedure: LEFT BELOW KNEE AMPUTATION;  Surgeon: Newt Minion, MD;  Location: [place];  Service: Orthopedics;  Laterality: Left;  . BACK  SURGERY  09/02/2015  . BONE BIOPSY Left 12/06/2018   Procedure: Bone Biopsy;  Surgeon: Edrick Kins, DPM;  Location: [place];  Service: Podiatry;  Laterality: Left;  . CERVICAL FUSION    . CESAREAN SECTION  1986  . COLONOSCOPY N/A 11/30/2012   Procedure: COLONOSCOPY;  Surgeon: Irene Shipper, MD;  Location: [facility] ENDOSCOPY;  Service: Endoscopy;  Laterality: N/A;  . COLONOSCOPY WITH PROPOFOL N/A 01/03/2016   Procedure: COLONOSCOPY WITH PROPOFOL;  Surgeon: Manya Silvas, MD;  Location: [facility] END[facility]COPY;  Service: Endoscopy;  Laterality: N/A;  . ESOPHAGOGASTRODUODENOSCOPY (EGD) WITH PROPOFOL N/A 01/14/2015   Procedure: ESOPHAGOGASTRODUODENOSCOPY (EGD) WITH PROPOFOL;  Surgeon: Manya Silvas, MD;  Location: [facility] ENDOSCOPY;  Service: Endoscopy;  Laterality: N/A;  . IRRIGATION AND DEBRIDEMENT FOOT Left 03/13/2018   Procedure: IRRIGATION AND DEBRIDEMENT FOOT WITH BONE BIOPSY WITH MISONIX DEBRIDER;  Surgeon: Evelina Bucy, DPM;  Location: [place];  Service: Podiatry;  Laterality: Left;  . IRRIGATION AND DEBRIDEMENT FOOT Left 12/06/2018   Procedure: Irrigation And Debridement Foot;  Surgeon: Edrick Kins, DPM;  Location: [place];  Service: Podiatry;  Laterality: Left;  . LEG AMPUTATION BELOW KNEE Left   . LUMBAR WOUND DEBRIDEMENT N/A 10/01/2015   Procedure: LUMBAR WOUND DEBRIDEMENT;  Surgeon: Ashok Pall, MD;  Location: [place] NEURO ORS;  Service: Neurosurgery;  Laterality: N/A;  LUMBAR WOUND DEBRIDEMENT  . NASAL SINUS SURGERY    . SAVORY DILATION N/A 01/14/2015   Procedure: SAVORY DILATION;  Surgeon: Manya Silvas, MD;  Location: [facility] ENDOSCOPY;  Service: Endoscopy;  Laterality: N/A;  . TONSILLECTOMY    . WISDOM TOOTH EXTRACTION      OB History   No obstetric history on file.      Home Medications    Prior to Admission medications   Medication Sig Start Date End Date Taking? Authorizing Provider  cetirizine (ZYRTEC ALLERGY) 10 MG tablet Take 1 tablet (10 mg total) by mouth daily. 07/19/20  Yes Pearson Forster, NP  triamcinolone (KENALOG) 0.1 % Apply 1 application topically 2 (two) times daily. 07/19/20  Yes Pearson Forster, NP  acetaminophen (TYLENOL) 500 MG tablet Take 1,000 mg by mouth every 6 (six) hours as needed for mild pain. Maximum of 3,000 mg per day 09/17/18   Arnetha Courser, MD  Albuterol Sulfate (PROAIR RESPICLICK) 161 (90 Base) MCG/ACT AEPB Inhale 1-2 puffs into the lungs every 6 (six) hours as needed (Shortness of breath or wheezing). 06/18/18   [name], FNP  allopurinol (ZYLOPRIM) 100 MG tablet Take 1 tablet (100 mg total) by mouth 2 (two) times daily. 03/25/20   Newt Minion, MD  aspirin EC 325 MG tablet Take 1 tablet (325 mg total) by mouth daily. 03/11/18   Steele Sizer, MD  Blood Glucose Monitoring Suppl (CONTOUR NEXT MONITOR) w/Device KIT as directed.  Patient not taking: Reported on 07/12/2020 06/29/17   [provider]  Chlorphen-Phenyleph-ASA (ALKA-SELTZER PLUS COLD PO) Take 2  tablets by mouth as needed (allergies). Day and night time Patient not taking: Reported on 07/12/2020    [provider]  Cholecalciferol 25 MCG (1000 UT) tablet Take by mouth. 01/09/20   [provider]  Colchicine 0.6 MG CAPS Take 0.6 mg by mouth 2 (two) times daily as needed. 03/25/20   Newt Minion, MD  cyanocobalamin 1000 MCG tablet Take by mouth. 01/09/20 01/08/21  [provider]  DULoxetine (CYMBALTA) 60 MG capsule Take 120 mg by mouth daily. 07/01/20   [provider]  fenofibrate (TRICOR) 145 MG tablet Take 1 tablet (145 mg total) by mouth daily. 07/24/19   Delsa Grana, PA-C  gabapentin (NEURONTIN) 100 MG capsule Take by mouth. 05/19/20   [provider]  gabapentin (NEURONTIN) 400 MG capsule Take 400 mg by mouth 4 (four) times daily.    [provider]  glimepiride (AMARYL) 2 MG tablet Take 2 mg by mouth 2 (two) times daily.  10/29/15   [provider]  glucose blood (PRECISION QID TEST) test strip 1 each (1 strip  total) by XX route once daily Use as instructed. Patient not taking: Reported on 07/12/2020 02/20/20 02/19/21  [provider]  hydrochlorothiazide (HYDRODIURIL) 12.5 MG tablet Take 1 tablet (12.5 mg total) by mouth daily. 07/24/19   Delsa Grana, PA-C  Incontinence Supply Disposable (ASSURANCE FITTED BRIEF LARGE) MISC Use 1 brief up to 6 times a day as needed for urinary incontinence Patient not taking: Reported on 07/12/2020 10/16/19   Delsa Grana, PA-C  insulin degludec (TRESIBA) 200 UNIT/ML FlexTouch Pen Inject 20 Units into the skin at bedtime.     [provider]  metFORMIN (GLUCOPHAGE-XR) 500 MG 24 hr tablet Take 500 mg by mouth 3 (three) times daily.  01/01/19   [provider]  mirabegron ER (MYRBETRIQ) 50 MG TB24 tablet Take 1 tablet (50 mg total) by mouth daily. 12/22/19   Bjorn Loser, MD  pantoprazole (PROTONIX) 40 MG tablet Take 1 tablet (40 mg total) by mouth daily as needed (Heartburn). 07/24/19   Delsa Grana, PA-C  tiZANidine (ZANAFLEX) 4 MG tablet Take 0.5-1.5 tablets (2-6 mg total) by mouth every 8 (eight) hours as needed for muscle spasms (muscle tightness). 06/22/20   Delsa Grana, PA-C  ULTICARE SHORT PEN NEEDLES 31G X 8 MM MISC  01/09/19   [provider]  Vitamin D, Ergocalciferol, (DRISDOL) 1.25 MG (50000 UNIT) CAPS capsule Take by mouth. Patient not taking: Reported on 07/12/2020 01/08/20   [provider]  Vitamin D, Ergocalciferol, (DRISDOL) 1.25 MG (50000 UNIT) CAPS capsule Take 1 capsule (50,000 Units total) by mouth every 7 (seven) days. x12 weeks. 07/12/20   Delsa Grana, PA-C    Family History Family History  Problem Relation Age of Onset  . Lung cancer Father   . Hypertension Father   . Arthritis Father   . Other Mother        hardening of the arteries/renal cell carcenoma  . Hypertension Mother   . Stroke Mother   . Kidney cancer Mother   . Arthritis Brother   . Rheum arthritis Maternal Uncle   . Bladder Cancer Neg Hx      Social History Social History   Tobacco Use  . Smoking status: Never Smoker  . Smokeless tobacco: Never Used  Vaping Use  . Vaping Use: Never used  Substance Use Topics  . Alcohol use: Never  . Drug use: No     Allergies   Eggs or egg-derived products,  Atorvastatin, Influenza virus vaccine, Latex, Pravastatin, and Tape   Review of Systems Review of Systems  Skin: Positive for rash.     Physical Exam Triage Vital Signs ED Triage Vitals  Enc Vitals Group     BP 07/19/20 1514 (!) 157/81     Pulse Rate 07/19/20 1514 99     Resp 07/19/20 1514 18     Temp 07/19/20 1514 99.2 F (37.3 C)     Temp Source 07/19/20 1514 Oral     SpO2 07/19/20 1514 95 %     Weight --      Height --      Head Circumference --      Peak Flow --      Pain Score 07/19/20 1518 0     Pain Loc --      Pain Edu? --      Excl. in [place]? --    No data found.  Updated Vital Signs BP (!) 157/81 (BP Location: Left Arm)   Pulse 99   Temp 99.2 F (37.3 C) (Oral)   Resp 18   LMP  (LMP Unknown)   SpO2 95%   Visual Acuity Right Eye Distance:   Left Eye Distance:   Bilateral Distance:    Right Eye Near:   Left Eye Near:    Bilateral Near:     Physical Exam Vitals and nursing note reviewed.  Constitutional:      General: She is not in acute distress.    Appearance: She is not ill-appearing or diaphoretic.  HENT:     Head: Normocephalic and atraumatic.  Eyes:     Conjunctiva/sclera: Conjunctivae normal.  Pulmonary:     Effort: Pulmonary effort is normal.  Musculoskeletal:        General: Normal range of motion.  Skin:    General: Skin is warm.     Findings: Rash present. Rash is urticarial.       Neurological:     Mental Status: She is alert.  Psychiatric:        Mood and Affect: Mood normal.      UC Treatments / Results  Labs (all labs ordered are listed, but only abnormal results are displayed) Labs Reviewed - No data to display  EKG   Radiology No results  found.  Procedures Procedures (including critical care time)  Medications Ordered in UC Medications - No data to display  Initial Impression / Assessment and Plan / UC Course  I have reviewed the triage vital signs and the nursing notes.  Pertinent labs & imaging results that were available during my care of the patient were reviewed by me and considered in my medical decision making (see chart for details).     Rash to bilateral neck since Friday. Assessment not concerning for infection or shingles.  Treating for what most likely is contact dermatitis.   Patient given kenalog cream and zyrtec to treat symptoms.   Patient to follow up in a few days if symptoms do not improve for worsen.    Final Clinical Impressions(s) / UC Diagnoses   Final diagnoses:  Rash and nonspecific skin eruption     Discharge Instructions     As discussed, use thin layer of cream on affected body parts twice a day for 1 week.  Take Zyrtec 1 pill a day.  You can continue to use cool compress to help with itching.    Follow up if symptoms worsen or do not improve over the next  several days.      ED Prescriptions    Medication Sig Dispense Auth. Provider   triamcinolone (KENALOG) 0.1 % Apply 1 application topically 2 (two) times daily. 30 g Pearson Forster, NP   cetirizine (ZYRTEC ALLERGY) 10 MG tablet Take 1 tablet (10 mg total) by mouth daily. 15 tablet Pearson Forster, NP     PDMP not reviewed this encounter.   Pearson Forster, NP 07/19/20 [id number]

## 2020-07-19 NOTE — Discharge Instructions (Addendum)
As discussed, use thin layer of cream on affected body parts twice a day for 1 week.  Take Zyrtec 1 pill a day.  You can continue to use cool compress to help with itching.    Follow up if symptoms worsen or do not improve over the next several days.

## 2020-07-19 NOTE — Telephone Encounter (Signed)
Called patient no available appts this week, directed to urgent care

## 2020-07-19 NOTE — Telephone Encounter (Signed)
Pt called in c/o swelling in her neck.   It's more swollen on the left than the right but it's swollen on both sides and comes around the front of her neck.   She has a swollen tender lump on the left side about the middle of her neck.   This all started Friday afternoon.   Denies shortness of breath, difficulty swallow, no sore throat, no fever.   She does have splotches and a lot of itching.   Some of the places are pink-red pimples that itch also. She does not know what is causing this.  She prefers to be seen in the office if possible due to the covid pandemic and long ED wait times.  I am sending a high priority note to [facility] for Delsa Grana, PA-C to see if they can see her today.   She was agreeable to this.    I went over the s/s to go to the ED:  Difficulty swallowing, difficulty breathing or talking or she feels like her throat is swelling closed.   She verbalized understanding and was agreeable to this plan.  She can be reached at [phone].  Reason for Disposition . Patient sounds very sick or weak to the triager  Answer Assessment - Initial Assessment Questions 1. APPEARANCE of RASH: "Describe the rash." (e.g., spots, blisters, raised areas, skin peeling, scaly)     Friday afternoon it looked like I had a swollen lump and splotches worse on left but it's on the right side too. Left ear without pain.   The lump on left side of neck is on the middle and it's swollen and tender.   Friday the swelling started.   Having swelling on right side but it's not as bad as the left.   I'm itching a lot and have pimples.   2. SIZE: "How big are the spots?" (e.g., tip of pen, eraser, coin; inches, centimeters)     The splotches are dime sized and others are pimples.  No pain but they itch.   I also found some on my leg and both my wrists. 3. LOCATION: "Where is the rash located?"     See above. 4. COLOR: "What color is the rash?" (Note: It is difficult to assess rash color  in people with darker-colored skin. When this situation occurs, simply ask the caller to describe what they see.)     Pinkish red.  No drainage 5. ONSET: "When did the rash begin?"     Friday afternoon on my neck.    The splotches on my wrists are not itching. 6. FEVER: "Do you have a fever?" If Yes, ask: "What is your temperature, how was it measured, and when did it start?"     I had an amputation of left leg and had issues with infections including MRSA in the past.   7. ITCHING: "Does the rash itch?" If Yes, ask: "How bad is the itch?" (Scale 1-10; or mild, moderate, severe)     Yes 8. CAUSE: "What do you think is causing the rash?"     I'm clueless.   The swelling around my neck is concerning. 9. MEDICATION FACTORS: "Have you started any new medications within the last 2 weeks?" (e.g., antibiotics)      No 10. OTHER SYMPTOMS: "Do you have any other symptoms?" (e.g., dizziness, headache, sore throat, joint pain)       No fever 11. PREGNANCY: "Is there any chance you are pregnant?" "When was  your last menstrual period?"       N/A due to age  Protocols used: [place]

## 2020-07-20 ENCOUNTER — Encounter: Payer: Self-pay | Admitting: Family Medicine

## 2020-07-21 ENCOUNTER — Telehealth: Payer: Self-pay

## 2020-07-21 NOTE — Telephone Encounter (Signed)
Copied from [place] [id number]. Topic: General - Other >> Jul 21, 2020 10:37 AM Lennox Solders wrote: Reason for CRM: Pt is calling and would like helen to return her call regarding medication. Pt stated she just talk with helen

## 2020-07-21 NOTE — Telephone Encounter (Signed)
Spoke to patient, she was confused about name brand received generic Zyrtec.

## 2020-07-27 ENCOUNTER — Other Ambulatory Visit: Payer: Self-pay

## 2020-07-27 ENCOUNTER — Other Ambulatory Visit: Payer: Self-pay | Admitting: Neurosurgery

## 2020-07-27 ENCOUNTER — Encounter: Payer: Self-pay | Admitting: Family Medicine

## 2020-07-27 ENCOUNTER — Ambulatory Visit (INDEPENDENT_AMBULATORY_CARE_PROVIDER_SITE_OTHER): Payer: Medicare Other | Admitting: Family Medicine

## 2020-07-27 DIAGNOSIS — R21 Rash and other nonspecific skin eruption: Secondary | ICD-10-CM | POA: Diagnosis not present

## 2020-07-27 DIAGNOSIS — M48061 Spinal stenosis, lumbar region without neurogenic claudication: Secondary | ICD-10-CM

## 2020-07-27 MED ORDER — METHYLPREDNISOLONE 4 MG PO TBPK: ORAL_TABLET | ORAL | 0 refills | Status: DC

## 2020-07-27 NOTE — Assessment & Plan Note (Addendum)
Appears allergic in nature, no known trigger, with some relief with kenalog cream and zyrtec use. No findings to concern for MRSA, other infection, or malignancy at this time. Will provide short steroid course. If rash persists despite steroid course or returns, would pursue derm referral for possible biopsy at that point.

## 2020-07-27 NOTE — Patient Instructions (Signed)
It was great to see you!  Our plans for today:  - Take the steroids as prescribed.  - If the rash doesn't get better with the steroids or if it comes back after completing, let us know.  Take care and seek immediate care sooner if you develop any concerns.   Dr. Ky Barban

## 2020-07-27 NOTE — Progress Notes (Signed)
    SUBJECTIVE:   CHIEF COMPLAINT / HPI:   Patient Active Problem List   Diagnosis Date Noted  . Rash and nonspecific skin eruption 07/27/2020  . Hepatotoxicity due to statin drug 07/15/2020  . Vitamin D deficiency 07/12/2020  . Chronic low back pain 07/12/2020  . BMI 36.0-36.9,adult 12/29/2019  . Sepsis without acute organ dysfunction ([place]) 12/29/2019  . Post-operative state 12/29/2019  . Asthma 12/29/2019  . Cervical disc disorder 12/29/2019  . Displacement of cervical intervertebral disc 12/29/2019  . Allergy to statin medication 12/01/2019  . History of below knee amputation, left ([place]) 07/30/2019  . Gangrene of left foot ([place]) 05/16/2019  . Charcot's joint of foot, left   . PAD (peripheral artery disease) ([place]) 12/05/2018  . MRSA bacteremia 12/05/2018  . Charcot's joint of foot due to diabetes ([place]) 09/18/2018  . Class 2 severe obesity with serious comorbidity and body mass index (BMI) of 37.0 to 37.9 in adult ([place]) 11/19/2017  . B12 deficiency 11/01/2017  . Diabetic polyneuropathy associated with type 2 diabetes mellitus ([place]) 12/13/2015  . Lumbar stenosis with neurogenic claudication 09/02/2015  . Chronic constipation 07/13/2015  . OSA (obstructive sleep apnea) 06/25/2015  . 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/23/2015  . Hyperlipidemia 04/23/2015  . Asthma, mild intermittent 02/01/2015  . Type 2 diabetes mellitus with diabetic nephropathy ([place]) 12/31/2014  . Gastroesophageal reflux disease with esophagitis 12/31/2014  . Dysphagia 12/31/2014  . Bilateral carpal tunnel syndrome 12/01/2014  . Diverticulosis of colon 11/30/2012  . Benign essential hypertension 11/28/2012   RASH - seen previously at [facility] for same 2/14, thought 2/2 contact dermatitis. Rx kenalog cream and zyrtec.   Duration:  ~1.5 weeks  Location: b/l neck, improved then spread to R arm and L leg. Neck now feels swollen. Starts out as small pimple. Itching: yes Burning: no Redness:  yes Oozing: no Scaling: no Blisters: no Painful: no Fevers: no, Tmax 99.37F Change in detergents/soaps/personal care products: no Recent illness: no Recent travel:no History of same: no Context: fluctuating Alleviating factors: zyrtec and hydrocortisone cream Treatments attempted:zyrtec and hydrocortisone cream Shortness of breath: no  Throat/tongue swelling: no Myalgias/arthralgias: nothing new  Does have h/o allergies.  CBGs 130-140s.   OBJECTIVE:   BP 140/60   Pulse (!) 106   Temp 98.6 F (37 C) (Oral)   Resp 16   Ht 5' (1.524 m)   Wt 185 lb 3.2 oz (84 kg)   LMP  (LMP Unknown)   SpO2 97%   BMI 36.17 kg/m   Gen: well appearing, in NAD Skin: diffuse scattered welts to inside of L arm with excoriations noted to outside of upper L arm, few erythematous papules to lower L arm. Few welts to R thigh. No fluctuance, discharge, scale, bleeding, generalized swelling or sloughing noted.   ASSESSMENT/PLAN:   Rash and nonspecific skin eruption Appears allergic in nature, no known trigger, with some relief with kenalog cream and zyrtec use. No findings to concern for MRSA, other infection, or malignancy at this time. Will provide short steroid course. If rash persists despite steroid course or returns, would pursue derm referral for possible biopsy at that point.     Myles Gip, DO

## 2020-07-29 LAB — HM DIABETES EYE EXAM

## 2020-08-04 ENCOUNTER — Other Ambulatory Visit: Payer: Self-pay

## 2020-08-04 ENCOUNTER — Ambulatory Visit
Admission: RE | Admit: 2020-08-04 | Discharge: 2020-08-04 | Disposition: A | Discharge status: Home / Self Care | Payer: Medicare Other | Source: Ambulatory Visit | Attending: Family Medicine | Admitting: Family Medicine

## 2020-08-04 DIAGNOSIS — Z1231 Encounter for screening mammogram for malignant neoplasm of breast: Secondary | ICD-10-CM | POA: Insufficient documentation

## 2020-08-12 ENCOUNTER — Ambulatory Visit
Admission: RE | Admit: 2020-08-12 | Discharge: 2020-08-12 | Disposition: A | Discharge status: Home / Self Care | Payer: Medicare Other | Source: Ambulatory Visit | Attending: Neurosurgery | Admitting: Neurosurgery

## 2020-08-12 ENCOUNTER — Other Ambulatory Visit: Payer: Self-pay

## 2020-08-12 DIAGNOSIS — M48061 Spinal stenosis, lumbar region without neurogenic claudication: Secondary | ICD-10-CM | POA: Insufficient documentation

## 2020-08-12 MED ORDER — GADOBUTROL 1 MMOL/ML IV SOLN
7.5000 mL | Freq: Once | INTRAVENOUS | Status: AC | PRN
  Administered 2020-08-12: 7.5 mL via INTRAVENOUS

## 2020-08-24 ENCOUNTER — Ambulatory Visit (INDEPENDENT_AMBULATORY_CARE_PROVIDER_SITE_OTHER): Payer: Medicare Other

## 2020-08-24 VITALS — BP 132/80 | Ht 60.0 in | Wt 185.0 lb

## 2020-08-24 DIAGNOSIS — Z0000 Encounter for general adult medical examination without abnormal findings: Secondary | ICD-10-CM

## 2020-08-24 NOTE — Patient Instructions (Signed)
Olivia Romero , Thank you for taking time to come for your Medicare Wellness Visit. I appreciate your ongoing commitment to your health goals. Please review the following plan we discussed and let me know if I can assist you in the future.   Screening recommendations/referrals: Colonoscopy: Done 01/03/2016 Mammogram: Done 08/04/2020 Bone Density: Due at age 65 Recommended yearly ophthalmology/optometry visit for glaucoma screening and checkup Recommended yearly dental visit for hygiene and checkup  Vaccinations: Influenza vaccine: Allergic - not recommended Pneumococcal vaccine: Done 01/20/2014, 09/22/2013 Tdap vaccine: Done 07/13/2015 Shingles vaccine: Due - Shingrix discussed. Please contact your pharmacy for coverage information.   Covid-19: Declined  Advanced directives: Advance directive discussed with you today. I have provided a copy for you to complete at home and have notarized. Once this is complete please bring a copy in to our office so we can scan it into your chart.  Conditions/risks identified: Keep up the great work! Continue fall prevention.  Next appointment: Follow up in one year for your annual wellness visit.   Preventive Care 40-64 Years, Female Preventive care refers to lifestyle choices and visits with your health care provider that can promote health and wellness. What does preventive care include?  A yearly physical exam. This is also called an annual well check.  Dental exams once or twice a year.  Routine eye exams. Ask your health care provider how often you should have your eyes checked.  Personal lifestyle choices, including:  Daily care of your teeth and gums.  Regular physical activity.  Eating a healthy diet.  Avoiding tobacco and drug use.  Limiting alcohol use.  Practicing safe sex.  Taking low-dose aspirin daily starting at age 66.  Taking vitamin and mineral supplements as recommended by your health care provider. What happens  during an annual well check? The services and screenings done by your health care provider during your annual well check will depend on your age, overall health, lifestyle risk factors, and family history of disease. Counseling  Your health care provider may ask you questions about your:  Alcohol use.  Tobacco use.  Drug use.  Emotional well-being.  Home and relationship well-being.  Sexual activity.  Eating habits.  Work and work Statistician.  Method of birth control.  Menstrual cycle.  Pregnancy history. Screening  You may have the following tests or measurements:  Height, weight, and BMI.  Blood pressure.  Lipid and cholesterol levels. These may be checked every 5 years, or more frequently if you are over 59 years old.  Skin check.  Lung cancer screening. You may have this screening every year starting at age 77 if you have a 30-pack-year history of smoking and currently smoke or have quit within the past 15 years.  Fecal occult blood test (FOBT) of the stool. You may have this test every year starting at age 66.  Flexible sigmoidoscopy or colonoscopy. You may have a sigmoidoscopy every 5 years or a colonoscopy every 10 years starting at age 83.  Hepatitis C blood test.  Hepatitis B blood test.  Sexually transmitted disease (STD) testing.  Diabetes screening. This is done by checking your blood sugar (glucose) after you have not eaten for a while (fasting). You may have this done every 1-3 years.  Mammogram. This may be done every 1-2 years. Talk to your health care provider about when you should start having regular mammograms. This may depend on whether you have a family history of breast cancer.  BRCA-related cancer screening. This may  be done if you have a family history of breast, ovarian, tubal, or peritoneal cancers.  Pelvic exam and Pap test. This may be done every 3 years starting at age 81. Starting at age 32, this may be done every 5 years if you  have a Pap test in combination with an HPV test.  Bone density scan. This is done to screen for osteoporosis. You may have this scan if you are at high risk for osteoporosis. Discuss your test results, treatment options, and if necessary, the need for more tests with your health care provider. Vaccines  Your health care provider may recommend certain vaccines, such as:  Influenza vaccine. This is recommended every year.  Tetanus, diphtheria, and acellular pertussis (Tdap, Td) vaccine. You may need a Td booster every 10 years.  Zoster vaccine. You may need this after age 36.  Pneumococcal 13-valent conjugate (PCV13) vaccine. You may need this if you have certain conditions and were not previously vaccinated.  Pneumococcal polysaccharide (PPSV23) vaccine. You may need one or two doses if you smoke cigarettes or if you have certain conditions. Talk to your health care provider about which screenings and vaccines you need and how often you need them. This information is not intended to replace advice given to you by your health care provider. Make sure you discuss any questions you have with your health care provider. Document Released: 06/18/2015 Document Revised: 02/09/2016 Document Reviewed: 03/23/2015 Elsevier Interactive Patient Education  2017 [employer] Prevention in the Home Falls can cause injuries. They can happen to people of all ages. There are many things you can do to make your home safe and to help prevent falls. What can I do on the outside of my home?  Regularly fix the edges of walkways and driveways and fix any cracks.  Remove anything that might make you trip as you walk through a door, such as a raised step or threshold.  Trim any bushes or trees on the path to your home.  Use bright outdoor lighting.  Clear any walking paths of anything that might make someone trip, such as rocks or tools.  Regularly check to see if handrails are loose or broken.  Make sure that both sides of any steps have handrails.  Any raised decks and porches should have guardrails on the edges.  Have any leaves, snow, or ice cleared regularly.  Use sand or salt on walking paths during winter.  Clean up any spills in your garage right away. This includes oil or grease spills. What can I do in the bathroom?  Use night lights.  Install grab bars by the toilet and in the tub and shower. Do not use towel bars as grab bars.  Use non-skid mats or decals in the tub or shower.  If you need to sit down in the shower, use a plastic, non-slip stool.  Keep the floor dry. Clean up any water that spills on the floor as soon as it happens.  Remove soap buildup in the tub or shower regularly.  Attach bath mats securely with double-sided non-slip rug tape.  Do not have throw rugs and other things on the floor that can make you trip. What can I do in the bedroom?  Use night lights.  Make sure that you have a light by your bed that is easy to reach.  Do not use any sheets or blankets that are too big for your bed. They should not hang down onto  the floor.  Have a firm chair that has side arms. You can use this for support while you get dressed.  Do not have throw rugs and other things on the floor that can make you trip. What can I do in the kitchen?  Clean up any spills right away.  Avoid walking on wet floors.  Keep items that you use a lot in easy-to-reach places.  If you need to reach something above you, use a strong step stool that has a grab bar.  Keep electrical cords out of the way.  Do not use floor polish or wax that makes floors slippery. If you must use wax, use non-skid floor wax.  Do not have throw rugs and other things on the floor that can make you trip. What can I do with my stairs?  Do not leave any items on the stairs.  Make sure that there are handrails on both sides of the stairs and use them. Fix handrails that are broken or  loose. Make sure that handrails are as long as the stairways.  Check any carpeting to make sure that it is firmly attached to the stairs. Fix any carpet that is loose or worn.  Avoid having throw rugs at the top or bottom of the stairs. If you do have throw rugs, attach them to the floor with carpet tape.  Make sure that you have a light switch at the top of the stairs and the bottom of the stairs. If you do not have them, ask someone to add them for you. What else can I do to help prevent falls?  Wear shoes that:  Do not have high heels.  Have rubber bottoms.  Are comfortable and fit you well.  Are closed at the toe. Do not wear sandals.  If you use a stepladder:  Make sure that it is fully opened. Do not climb a closed stepladder.  Make sure that both sides of the stepladder are locked into place.  Ask someone to hold it for you, if possible.  Clearly mark and make sure that you can see:  Any grab bars or handrails.  First and last steps.  Where the edge of each step is.  Use tools that help you move around (mobility aids) if they are needed. These include:  Canes.  Walkers.  Scooters.  Crutches.  Turn on the lights when you go into a dark area. Replace any light bulbs as soon as they burn out.  Set up your furniture so you have a clear path. Avoid moving your furniture around.  If any of your floors are uneven, fix them.  If there are any pets around you, be aware of where they are.  Review your medicines with your doctor. Some medicines can make you feel dizzy. This can increase your chance of falling. Ask your doctor what other things that you can do to help prevent falls. This information is not intended to replace advice given to you by your health care provider. Make sure you discuss any questions you have with your health care provider. Document Released: 03/18/2009 Document Revised: 10/28/2015 Document Reviewed: 06/26/2014 Elsevier Interactive  Patient Education  2017 [employer].

## 2020-08-24 NOTE — Progress Notes (Signed)
Subjective:   Olivia Romero is a 65 y.o. female who presents for Medicare Annual (Subsequent) preventive examination.  Virtual Visit via Telephone Note  I connected with  Olivia Romero on 08/24/20 at  8:40 AM EDT by telephone and verified that I am speaking with the correct person using two identifiers.  Location: Patient: Home Provider: [place] Persons participating in the virtual visit: patient/Nurse Health Advisor   I discussed the limitations, risks, security and privacy concerns of performing an evaluation and management service by telephone and the availability of in person appointments. The patient expressed understanding and agreed to proceed.  Interactive audio and video telecommunications were attempted between this nurse and patient, however failed, due to patient having technical difficulties OR patient did not have access to video capability.  We continued and completed visit with audio only.  Some vital signs may be absent or patient reported.   Clemetine Marker, LPN    Review of Systems     Cardiac Risk Factors include: advanced age (>50mn, >>18women);diabetes mellitus;dyslipidemia;obesity (BMI >30kg/m2);hypertension;sedentary lifestyle     Objective:    Today's Vitals   08/24/20 0849 08/24/20 0851  BP: 132/80   Weight: 185 lb (83.9 kg)   Height: 5' (1.524 m)   PainSc:  4    Body mass index is 36.13 kg/m.  Advanced Directives 08/24/2020 09/22/2019 08/21/2019 05/16/2019 12/04/2018 11/27/2018 03/11/2018  Does Patient Have a Medical Advance Directive? No No No No No No No  Type of Advance Directive - - - - - - -  Does patient want to make changes to medical advance directive? Yes (MAU/Ambulatory/Procedural Areas - Information given) - - - - - -  Copy of Healthcare Power of Attorney in Chart? - - - - - - -  Would patient like information on creating a medical advance directive? - No - Patient declined Yes (MAU/Ambulatory/Procedural Areas - Information given) No -  Patient declined Yes (Inpatient - patient defers creating a medical advance directive at this time - Information given) - No - Patient declined  Pre-existing out of facility DNR order (yellow form or pink MOST form) - - - - - - -    Current Medications (verified) Outpatient Encounter Medications as of 08/24/2020  Medication Sig  . acetaminophen (TYLENOL) 500 MG tablet Take 1,000 mg by mouth every 6 (six) hours as needed for mild pain. Maximum of 3,000 mg per day  . Albuterol Sulfate (PROAIR RESPICLICK) 1027(90 Base) MCG/ACT AEPB Inhale 1-2 puffs into the lungs every 6 (six) hours as needed (Shortness of breath or wheezing).  .Marland Kitchenallopurinol (ZYLOPRIM) 100 MG tablet Take 1 tablet (100 mg total) by mouth 2 (two) times daily.  .Marland Kitchenaspirin EC 325 MG tablet Take 1 tablet (325 mg total) by mouth daily.  . Blood Glucose Monitoring Suppl (CONTOUR NEXT MONITOR) w/Device KIT as directed.  . cetirizine (ZYRTEC ALLERGY) 10 MG tablet Take 1 tablet (10 mg total) by mouth daily.  . cyanocobalamin 1000 MCG tablet Take by mouth.  . DULoxetine (CYMBALTA) 60 MG capsule Take 120 mg by mouth daily.  . fenofibrate (TRICOR) 145 MG tablet Take 1 tablet (145 mg total) by mouth daily.  .Marland Kitchengabapentin (NEURONTIN) 100 MG capsule Take by mouth.  . gabapentin (NEURONTIN) 400 MG capsule Take 400 mg by mouth 4 (four) times daily.  .Marland Kitchenglimepiride (AMARYL) 2 MG tablet Take 2 mg by mouth 2 (two) times daily.   .Marland Kitchenglucose blood (PRECISION QID TEST) test strip   .  hydrochlorothiazide (HYDRODIURIL) 12.5 MG tablet Take 1 tablet (12.5 mg total) by mouth daily.  . Incontinence Supply Disposable (ASSURANCE FITTED BRIEF LARGE) MISC Use 1 brief up to 6 times a day as needed for urinary incontinence  . insulin degludec (TRESIBA) 200 UNIT/ML FlexTouch Pen Inject 20 Units into the skin at bedtime.   . metFORMIN (GLUCOPHAGE-XR) 500 MG 24 hr tablet Take 500 mg by mouth 3 (three) times daily.   . pantoprazole (PROTONIX) 40 MG tablet Take 1 tablet (40  mg total) by mouth daily as needed (Heartburn).  [name] tiZANidine (ZANAFLEX) 4 MG tablet Take 0.5-1.5 tablets (2-6 mg total) by mouth every 8 (eight) hours as needed for muscle spasms (muscle tightness).  [name] ULTICARE SHORT PEN NEEDLES 31G X 8 MM MISC   . Cholecalciferol 25 MCG (1000 UT) tablet Take by mouth. (Patient not taking: Reported on 08/24/2020)  . Colchicine 0.6 MG CAPS Take 0.6 mg by mouth 2 (two) times daily as needed. (Patient not taking: Reported on 08/24/2020)  . methylPREDNISolone (MEDROL DOSEPAK) 4 MG TBPK tablet Day 1: Take 8 mg (2 tablets) before breakfast, 4 mg (1 tablet) after lunch, 4 mg (1 tablet) after supper, and 8 mg (2 tablets) at bedtime. Day 2:Take 4 mg (1 tablet) before breakfast, 4 mg (1 tablet) after lunch, 4 mg (1 tablet) after supper, and 8 mg (2 tablets) at bedtime. Day 3: Take 4 mg (1 tablet) before breakfast, 4 mg (1 tablet) after lunch, 4 mg (1 tablet) after supper, and 4 mg (1 tablet) at bedtime. Day 4: Take 4 mg (1 tablet) before breakfast, 4 mg (1 tablet) after lunch, and 4 mg (1 tablet) at bedtime. Day 5: Take 4 mg (1 tablet) before breakfast and 4 mg (1 tablet) at bedtime. Day 6: Take 4 mg (1 tablet) before breakfast. (Patient not taking: Reported on 08/24/2020)  . triamcinolone (KENALOG) 0.1 % Apply 1 application topically 2 (two) times daily. (Patient not taking: Reported on 08/24/2020)  . Vitamin D, Ergocalciferol, (DRISDOL) 1.25 MG (50000 UNIT) CAPS capsule Take 1 capsule (50,000 Units total) by mouth every 7 (seven) days. x12 weeks.  . [DISCONTINUED] Vitamin D, Ergocalciferol, (DRISDOL) 1.25 MG (50000 UNIT) CAPS capsule Take by mouth.   No facility-administered encounter medications on file as of 08/24/2020.    Allergies (verified) Eggs or egg-derived products, Atorvastatin, Influenza virus vaccine, Latex, Pravastatin, and Tape   History: Past Medical History:  Diagnosis Date  . Anemia   . Arthritis   . Asthma   . Colon polyps    adenomatous  . Diabetes  mellitus without complication ([place])   . Diabetic infection of left foot ([place]) 12/2018  . Diverticulosis of colon   . Esophagitis   . GERD (gastroesophageal reflux disease)   . Hemorrhoid    internal  . Hyperlipemia   . Hypertension   . IBS (irritable bowel syndrome)    no current prob - diet controlled  . Myalgia due to statin 11/19/2017  . Neuropathy   . Neuropathy of both feet   . Neuropathy of hand   . Pneumonia    x 4  . PONV (postoperative nausea and vomiting)    on some surgeries but not all procedures  . Skin ulcer of right ankle, limited to breakdown of skin ([place]) 12/31/2016   resolved per patient 05/14/19  . Sleep apnea    has had in the past lost 50 pounds and do longer uses cpap  . Statin intolerance 04/23/2015  . Stroke [facility])  2009   mini stroke per patient  . Stroke-like episode 2009   TIA - mini stroke per patient  . 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 Uncontrolled type 2 diabetes mellitus with hyperglycemia ([place]) 11/01/2017   Past Surgical History:  Procedure Laterality Date  . ABDOMINAL HYSTERECTOMY    . AMPUTATION Left 12/06/2018   Procedure: PARTIAL AMPUTATION LEFT FOOT;  Surgeon: Edrick Kins, DPM;  Location: [place];  Service: Podiatry;  Laterality: Left;  . AMPUTATION Left 05/16/2019   Procedure: LEFT BELOW KNEE AMPUTATION;  Surgeon: Newt Minion, MD;  Location: [place];  Service: Orthopedics;  Laterality: Left;  . BACK SURGERY  09/02/2015  . BONE BIOPSY Left 12/06/2018   Procedure: Bone Biopsy;  Surgeon: Edrick Kins, DPM;  Location: [place];  Service: Podiatry;  Laterality: Left;  . CERVICAL FUSION    . CESAREAN SECTION  1986  . COLONOSCOPY N/A 11/30/2012   Procedure: COLONOSCOPY;  Surgeon: Irene Shipper, MD;  Location: [facility] ENDOSCOPY;  Service: Endoscopy;  Laterality: N/A;  . COLONOSCOPY WITH PROPOFOL N/A 01/03/2016   Procedure: COLONOSCOPY WITH PROPOFOL;  Surgeon: Manya Silvas, MD;  Location: [facility] ENDOSCOPY;   Service: Endoscopy;  Laterality: N/A;  . ESOPHAGOGASTRODUODENOSCOPY (EGD) WITH PROPOFOL N/A 01/14/2015   Procedure: ESOPHAGOGASTRODUODENOSCOPY (EGD) WITH PROPOFOL;  Surgeon: Manya Silvas, MD;  Location: [facility] ENDOSCOPY;  Service: Endoscopy;  Laterality: N/A;  . IRRIGATION AND DEBRIDEMENT FOOT Left 03/13/2018   Procedure: IRRIGATION AND DEBRIDEMENT FOOT WITH BONE BIOPSY WITH MISONIX DEBRIDER;  Surgeon: Evelina Bucy, DPM;  Location: [place];  Service: Podiatry;  Laterality: Left;  . IRRIGATION AND DEBRIDEMENT FOOT Left 12/06/2018   Procedure: Irrigation And Debridement Foot;  Surgeon: Edrick Kins, DPM;  Location: [place];  Service: Podiatry;  Laterality: Left;  . LEG AMPUTATION BELOW KNEE Left   . LUMBAR WOUND DEBRIDEMENT N/A 10/01/2015   Procedure: LUMBAR WOUND DEBRIDEMENT;  Surgeon: Ashok Pall, MD;  Location: [place] NEURO ORS;  Service: Neurosurgery;  Laterality: N/A;  LUMBAR WOUND DEBRIDEMENT  . NASAL SINUS SURGERY    . SAVORY DILATION N/A 01/14/2015   Procedure: SAVORY DILATION;  Surgeon: Manya Silvas, MD;  Location: [facility] ENDOSCOPY;  Service: Endoscopy;  Laterality: N/A;  . TONSILLECTOMY    . WISDOM TOOTH EXTRACTION     Family History  Problem Relation Age of Onset  . Lung cancer Father   . Hypertension Father   . Arthritis Father   . Other Mother        hardening of the arteries/renal cell carcenoma  . Hypertension Mother   . Stroke Mother   . Kidney cancer Mother   . Arthritis Brother   . Rheum arthritis Maternal Uncle   . Bladder Cancer Neg Hx    Social History   Socioeconomic History  . Marital status: Single    Spouse name: Not on file  . Number of children: 1  . Years of education: Not on file  . Highest education level: Not on file  Occupational History  . Occupation: disabled  Tobacco Use  . Smoking status: Never Smoker  . Smokeless tobacco: Never Used  Vaping Use  . Vaping Use: Never used  Substance and Sexual Activity  . Alcohol use: Never  . Drug use: No   . Sexual activity: Not Currently    Comment: Hysterectomy  Other Topics Concern  . Not on file  Social History Narrative  . Not on file   Social Determinants  of Health   Financial Resource Strain: Low Risk   . Difficulty of Paying Living Expenses: Not very hard  Food Insecurity: No Food Insecurity  . Worried About Charity fundraiser in the Last Year: Never true  . Ran Out of Food in the Last Year: Never true  Transportation Needs: No Transportation Needs  . Lack of Transportation (Medical): No  . Lack of Transportation (Non-Medical): No  Physical Activity: Insufficiently Active  . Days of Exercise per Week: 7 days  . Minutes of Exercise per Session: 20 min  Stress: No Stress Concern Present  . Feeling of Stress : Not at all  Social Connections: Unknown  . Frequency of Communication with Friends and Family: More than three times a week  . Frequency of Social Gatherings with Friends and Family: Once a week  . Attends Religious Services: More than 4 times per year  . Active Member of Clubs or Organizations: Yes  . Attends Archivist Meetings: More than 4 times per year  . Marital Status: Not on file    Tobacco Counseling Counseling given: Not Answered   Clinical Intake:  Pre-visit preparation completed: Yes  Pain : 0-10 Pain Score: 4  Pain Type: Chronic pain Pain Location: Back Pain Descriptors / Indicators: Aching,Constant Pain Onset: More than a month ago Pain Frequency: Intermittent     BMI - recorded: 36.13 Nutritional Status: BMI > 30  Obese Nutritional Risks: None Diabetes: Yes CBG done?: No Did pt. bring in CBG monitor from home?: No  How often do you need to have someone help you when you read instructions, pamphlets, or other written materials from your doctor or pharmacy?: 1 - Never  Nutrition Risk Assessment:  Has the patient had any N/V/D within the last 2 months?  No  Does the patient have any non-healing wounds?  No  Has the  patient had any unintentional weight loss or weight gain?  No   Diabetes:  Is the patient diabetic?  Yes  If diabetic, was a CBG obtained today?  No  Did the patient bring in their glucometer from home?  No  How often do you monitor your CBG's? Fasting daily.   Financial Strains and Diabetes Management:  Are you having any financial strains with the device, your supplies or your medication? No .  Does the patient want to be seen by Chronic Care Management for management of their diabetes?  No  Would the patient like to be referred to a Nutritionist or for Diabetic Management?  No   Diabetic Exams:  Diabetic Eye Exam: Completed 07/29/2020. Overdue for diabetic eye exam. Pt has been advised about the importance in completing this exam. A referral has been placed today. Message sent to referral coordinator for scheduling purposes. Advised pt to expect a call from office referred to regarding appt.  Diabetic Foot Exam: Completed 07/26/2018. Pt has been advised about the importance in completing this exam. Pt is scheduled for diabetic foot exam on 10/11/2020.    Interpreter Needed?: No  Information entered by :: [name] Hopkins, LPN   Activities of Daily Living In your present state of health, do you have any difficulty performing the following activities: 08/24/2020 07/27/2020  Hearing? Y Y  Comment c/o mild hearing loss -  Vision? N Y  Difficulty concentrating or making decisions? N Y  Walking or climbing stairs? Y Y  Comment using crutches - she has a ramp -  Dressing or bathing? N N  Doing errands, shopping?  N Y  Conservation officer, nature and eating ? N -  Using the Toilet? N -  In the past six months, have you accidently leaked urine? Y -  Do you have problems with loss of bowel control? N -  Managing your Medications? N -  Managing your Finances? N -  Housekeeping or managing your Housekeeping? N -  Some recent data might be hidden    Patient Care Team: Delsa Grana, PA-C as PCP -  General (Family Medicine) Gabriel Carina Betsey Holiday, MD as Physician Assistant (Endocrinology) Vladimir Crofts, MD as Consulting Physician (Neurology) Trula Slade, DPM as Consulting Physician (Podiatry) Newt Minion, MD as Consulting Physician (Orthopedic Surgery)  Indicate any recent Medical Services you may have received from other than [facility] providers in the past year (date may be approximate).     Assessment:   This is a routine wellness examination for [facility].  Hearing/Vision screen No exam data present  Dietary issues and exercise activities discussed:    Goals    . Exercise 3x per week (30 min per time)    . Patient Stated     Patient currently in physical therapy to be able to walk again with prosthesis.       Depression Screen PHQ 2/9 Scores 08/24/2020 07/12/2020 06/22/2020 06/09/2020 12/19/2019 09/30/2019 08/21/2019  PHQ - 2 Score 0 0 0 0 0 0 0  PHQ- 9 Score - - 0 - 0 0 -    Fall Risk Fall Risk  07/27/2020 07/27/2020 07/12/2020 06/22/2020 06/09/2020  Falls in the past year? 0 0 0 0 0  Number falls in past yr: 0 0 0 0 0  Injury with Fall? 0 0 0 0 0  Risk for fall due to : - - Impaired mobility - -  Follow up - - Falls evaluation completed - -    FALL RISK PREVENTION PERTAINING TO THE HOME:  Any stairs in or around the home? No If so, are there any without handrails? No  Home free of loose throw rugs in walkways, pet beds, electrical cords, etc? Yes  Adequate lighting in your home to reduce risk of falls? Yes   ASSISTIVE DEVICES UTILIZED TO PREVENT FALLS:  Life alert? No  Use of a cane, walker or w/c? Yes  Grab bars in the bathroom? Yes  Shower chair or bench in shower? Yes  Elevated toilet seat or a handicapped toilet? Yes   TIMED UP AND GO:  Was the test performed? No .  Telephonic visit   Cognitive Function:    Normal cognitive status assessed by direct observation by this Nurse Health Advisor. No abnormalities found.   - she has been evaluated by Neurologist    6CIT Screen 01/31/2017  What Year? 0 points  What month? 0 points  What time? 0 points  Count back from 20 0 points  Months in reverse 0 points  Repeat phrase 8 points  Total Score 8    Immunizations Immunization History  Administered Date(s) Administered  . Influenza-Unspecified 06/24/2015  . Pneumococcal Polysaccharide-23 01/20/2014  . Tdap 07/13/2015    TDAP status: Up to date  Flu Vaccine status: Declined, Education has been provided regarding the importance of this vaccine but patient still declined. Advised may receive this vaccine at local pharmacy or Health Dept. Aware to provide a copy of the vaccination record if obtained from local pharmacy or Health Dept. Verbalized acceptance and understanding.  Pneumococcal vaccine status: Up to date  Covid-19 vaccine status: Declined, Education  has been provided regarding the importance of this vaccine but patient still declined. Advised may receive this vaccine at local pharmacy or Health Dept.or vaccine clinic. Aware to provide a copy of the vaccination record if obtained from local pharmacy or Health Dept. Verbalized acceptance and understanding.  Qualifies for Shingles Vaccine? Yes   Zostavax completed No   Shingrix Completed?: No.    Education has been provided regarding the importance of this vaccine. Patient has been advised to call insurance company to determine out of pocket expense if they have not yet received this vaccine. Advised may also receive vaccine at local pharmacy or Health Dept. Verbalized acceptance and understanding.  Screening Tests Health Maintenance  Topic Date Due  . COVID-19 Vaccine (1) Never done  . FOOT EXAM  07/27/2019  . PAP SMEAR-Modifier  11/30/2019  . HEMOGLOBIN A1C  12/29/2020  . OPHTHALMOLOGY EXAM  07/29/2021  . MAMMOGRAM  08/04/2021  . TETANUS/TDAP  07/12/2025  . COLONOSCOPY (Pts 45-65yr Insurance coverage will need to be confirmed)  01/02/2026  . PNEUMOCOCCAL POLYSACCHARIDE VACCINE AGE  22-64 HIGH RISK  Completed  . Hepatitis C Screening  Completed  . HIV Screening  Completed  . HPV VACCINES  Aged Out  . INFLUENZA VACCINE  Discontinued    Health Maintenance  Health Maintenance Due  Topic Date Due  . COVID-19 Vaccine (1) Never done  . FOOT EXAM  07/27/2019  . PAP SMEAR-Modifier  11/30/2019    Colorectal cancer screening: Type of screening: Colonoscopy. Completed 01/03/2016. Repeat every 10 years  Mammogram status: Completed 08/04/2020. Repeat every year  Bone Density: recommend at age 65 Lung Cancer Screening: (Low Dose CT Chest recommended if Age 709-80years, 30 pack-year currently smoking OR have quit w/in 15years.) does not qualify.   Additional Screening:  Hepatitis C Screening: does qualify; Completed 07/12/2020  Vision Screening: Recommended annual ophthalmology exams for early detection of glaucoma and other disorders of the eye. Is the patient up to date with their annual eye exam?  Yes  Who is the provider or what is the name of the office in which the patient attends annual eye exams[facility] not [facility] with a provider, would they like to be referred to a provider to establish care? No .   Dental Screening: Recommended annual dental exams for proper oral hygiene  Community Resource Referral / Chronic Care Management: CRR required this visit?  No   CCM required this visit?  No      Plan:     I have personally reviewed and noted the following in the patient's chart:   . Medical and social history . Use of alcohol, tobacco or illicit drugs  . Current medications and supplements . Functional ability and status . Nutritional status . Physical activity . Advanced directives . List of other physicians . Hospitalizations, surgeries, and ER visits in previous 12 months . Vitals . Screenings to include cognitive, depression, and falls . Referrals and appointments  In addition, I have reviewed and discussed with  patient certain preventive protocols, quality metrics, and best practice recommendations. A written personalized care plan for preventive services as well as general preventive health recommendations were provided to patient.     KClemetine Marker LPN   [id number]  Nurse Notes: None

## 2020-08-26 ENCOUNTER — Ambulatory Visit: Payer: Medicare Other | Admitting: Podiatry

## 2020-09-07 ENCOUNTER — Other Ambulatory Visit: Payer: Self-pay | Admitting: Family Medicine

## 2020-09-07 DIAGNOSIS — M5441 Lumbago with sciatica, right side: Secondary | ICD-10-CM

## 2020-09-23 ENCOUNTER — Ambulatory Visit: Payer: Medicare Other | Admitting: Podiatry

## 2020-09-28 ENCOUNTER — Ambulatory Visit: Payer: Medicare Other | Admitting: Podiatry

## 2020-09-28 ENCOUNTER — Other Ambulatory Visit: Payer: Self-pay

## 2020-09-28 DIAGNOSIS — E119 Type 2 diabetes mellitus without complications: Secondary | ICD-10-CM | POA: Diagnosis not present

## 2020-09-28 DIAGNOSIS — B351 Tinea unguium: Secondary | ICD-10-CM | POA: Diagnosis not present

## 2020-09-28 DIAGNOSIS — M79674 Pain in right toe(s): Secondary | ICD-10-CM

## 2020-09-28 DIAGNOSIS — E1149 Type 2 diabetes mellitus with other diabetic neurological complication: Secondary | ICD-10-CM | POA: Diagnosis not present

## 2020-10-02 NOTE — Progress Notes (Signed)
Subjective: 65 y.o. returns the office today for painful, elongated, thickened toenails which she cannot trim herself.  Also presents today for diabetic foot exam.  States that she has been doing well and she is not seeing any open sores.  No swelling or changes.  No injuries.  She still continue the [place] brace on the right side.  PCP: Delsa Grana, PA-C  Objective: AAO 3, NAD DP/PT pulses palpable, CRT less than 3 seconds Left below-knee amputation Sensation absent with [place] 1 to monofilament. Nails hypertrophic, dystrophic, elongated, brittle, discolored 5. There is tenderness overlying the nails 1-5 on the right. There is no surrounding erythema or drainage along the nail sites. Prominence of the fourth metatarsal head plantarly without any hyperkeratotic tissue.  There is no open lesions identified. No pain with calf compression, swelling, warmth, erythema.  Assessment: Patient presents with symptomatic onychomycosis  Plan: -Treatment options including alternatives, risks, complications were discussed -Nails sharply debrided 5 without complication/bleeding. -Continue to monitor right foot daily.  Moisturizer daily. -Continue [place] brace on the right side for stability and offloading. -Discussed daily foot inspection. If there are any changes, to call the office immediately.  -Follow-up as scheduled or sooner if any problems are to arise. In the meantime, encouraged to call the office with any questions, concerns, changes symptoms.  Celesta Gentile, DPM

## 2020-10-06 ENCOUNTER — Other Ambulatory Visit: Payer: Self-pay | Admitting: Family Medicine

## 2020-10-06 DIAGNOSIS — E559 Vitamin D deficiency, unspecified: Secondary | ICD-10-CM

## 2020-10-11 ENCOUNTER — Ambulatory Visit: Payer: Medicare Other | Admitting: Family Medicine

## 2020-10-12 ENCOUNTER — Ambulatory Visit: Payer: Medicare Other | Admitting: Family Medicine

## 2020-10-19 ENCOUNTER — Ambulatory Visit: Payer: Self-pay | Admitting: Unknown Physician Specialty

## 2020-10-20 ENCOUNTER — Other Ambulatory Visit: Payer: Self-pay | Admitting: Family Medicine

## 2020-10-21 ENCOUNTER — Other Ambulatory Visit: Payer: Self-pay

## 2020-10-21 ENCOUNTER — Ambulatory Visit (INDEPENDENT_AMBULATORY_CARE_PROVIDER_SITE_OTHER): Payer: Medicare Other | Admitting: Unknown Physician Specialty

## 2020-10-21 ENCOUNTER — Encounter: Payer: Self-pay | Admitting: Unknown Physician Specialty

## 2020-10-21 DIAGNOSIS — I10 Essential (primary) hypertension: Secondary | ICD-10-CM

## 2020-10-21 DIAGNOSIS — J45909 Unspecified asthma, uncomplicated: Secondary | ICD-10-CM | POA: Diagnosis not present

## 2020-10-21 DIAGNOSIS — L509 Urticaria, unspecified: Secondary | ICD-10-CM | POA: Diagnosis not present

## 2020-10-21 DIAGNOSIS — E785 Hyperlipidemia, unspecified: Secondary | ICD-10-CM | POA: Diagnosis not present

## 2020-10-21 MED ORDER — CETIRIZINE HCL 10 MG PO TABS: 10.0000 mg | ORAL_TABLET | Freq: Every day | ORAL | 1 refills | Status: DC

## 2020-10-21 NOTE — Progress Notes (Signed)
BP 128/72   Pulse 88   Temp 99 F (37.2 C) (Oral)   Resp 16   Ht 5' (1.524 m)   Wt 185 lb (83.9 kg)   LMP  (LMP Unknown)   SpO2 99%   BMI 36.13 kg/m    Subjective:    Patient ID: Olivia Romero, female    DOB: July 14, 1955, 65 y.o.   MRN: [id number]  HPI: Olivia Romero is a 65 y.o. female  Chief Complaint  Patient presents with  . Rash    Low grade fever, seen at [facility]  . Hyperlipidemia    Follow up  . Hypertension    Diabetes: Endocrinology is following.  Pt states last reading was low 7's  Rash Improved with meds from urgent care.  Finds that the Zyrtec prescribed is very helpful and would like to contineu  Hypertension  Taking HCTZ Using medications without difficulty Average home BPs 120's   Using medication without problems or lightheadedness No chest pain with exertion or shortness of breath No Edema  Elevated Cholesterol Statin intolerant.  Takes a Fenobibrate Exercise: Walks with crutches  Not fasting today  Asthma Takes inhaler on occasion when she needs it.  No problems other than occasionally   Relevant past medical, surgical, family and social history reviewed and updated as indicated. Interim medical history since our last visit reviewed. Allergies and medications reviewed and updated.  Review of Systems  Per HPI unless specifically indicated above     Objective:    BP 128/72   Pulse 88   Temp 99 F (37.2 C) (Oral)   Resp 16   Ht 5' (1.524 m)   Wt 185 lb (83.9 kg)   LMP  (LMP Unknown)   SpO2 99%   BMI 36.13 kg/m   Wt Readings from Last 3 Encounters:  10/21/20 185 lb (83.9 kg)  08/24/20 185 lb (83.9 kg)  07/27/20 185 lb 3.2 oz (84 kg)    Physical Exam Constitutional:      General: She is not in acute distress.    Appearance: Normal appearance. She is well-developed.  HENT:     Head: Normocephalic and atraumatic.  Eyes:     General: Lids are normal. No scleral icterus.       Right eye: No discharge.        Left eye:  No discharge.     Conjunctiva/sclera: Conjunctivae normal.  Neck:     Vascular: No carotid bruit or JVD.  Cardiovascular:     Rate and Rhythm: Normal rate and regular rhythm.     Heart sounds: Normal heart sounds.  Pulmonary:     Effort: Pulmonary effort is normal.     Breath sounds: Normal breath sounds.  Abdominal:     Palpations: There is no hepatomegaly or splenomegaly.  Musculoskeletal:        General: Normal range of motion.     Cervical back: Normal range of motion and neck supple.  Skin:    General: Skin is warm and dry.     Coloration: Skin is not pale.     Findings: No rash.  Neurological:     Mental Status: She is alert and oriented to person, place, and time.  Psychiatric:        Behavior: Behavior normal.        Thought Content: Thought content normal.        Judgment: Judgment normal.     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        Benign essential hypertension    Pt not on an ACE.  Last microalbumin was <30 and checking at endocrinology.  Continue present meds      Hyperlipidemia    Statin intolerant.  Taking Fenobibrate but not fasting today.  Hopefully they will check with endocrinology where she does go fasting.  Recheck 3 months to review labs      Urticaria    Doing well.  Will rx cetirizine as that has helped quite a bit         Check Microalbumin and lipids from endocrinology in 3 months  Follow up plan: Return in about 3 months (around 01/21/2021).

## 2020-10-21 NOTE — Assessment & Plan Note (Signed)
Pt not on an ACE.  Last microalbumin was <30 and checking at endocrinology.  Continue present meds

## 2020-10-21 NOTE — Assessment & Plan Note (Signed)
Stable, continue present medications.   

## 2020-10-21 NOTE — Assessment & Plan Note (Signed)
Doing well.  Will rx cetirizine as that has helped quite a bit

## 2020-10-21 NOTE — Assessment & Plan Note (Signed)
Statin intolerant.  Taking Fenobibrate but not fasting today.  Hopefully they will check with endocrinology where she does go fasting.  Recheck 3 months to review labs

## 2020-11-10 ENCOUNTER — Other Ambulatory Visit (HOSPITAL_COMMUNITY): Payer: Self-pay | Admitting: Neurology

## 2020-11-10 ENCOUNTER — Other Ambulatory Visit: Payer: Self-pay | Admitting: Neurology

## 2020-11-10 DIAGNOSIS — E1142 Type 2 diabetes mellitus with diabetic polyneuropathy: Secondary | ICD-10-CM

## 2020-11-10 DIAGNOSIS — R200 Anesthesia of skin: Secondary | ICD-10-CM

## 2020-11-11 ENCOUNTER — Other Ambulatory Visit: Payer: Self-pay

## 2020-11-11 ENCOUNTER — Ambulatory Visit
Admission: RE | Admit: 2020-11-11 | Discharge: 2020-11-11 | Disposition: A | Discharge status: Home / Self Care | Payer: Medicare Other | Source: Ambulatory Visit | Attending: Neurology | Admitting: Neurology

## 2020-11-11 DIAGNOSIS — E1142 Type 2 diabetes mellitus with diabetic polyneuropathy: Secondary | ICD-10-CM | POA: Diagnosis present

## 2020-11-11 DIAGNOSIS — R200 Anesthesia of skin: Secondary | ICD-10-CM | POA: Diagnosis present

## 2020-11-11 MED ORDER — GADOBUTROL 1 MMOL/ML IV SOLN
8.0000 mL | Freq: Once | INTRAVENOUS | Status: AC | PRN
  Administered 2020-11-11: 8 mL via INTRAVENOUS

## 2020-12-22 ENCOUNTER — Telehealth (INDEPENDENT_AMBULATORY_CARE_PROVIDER_SITE_OTHER): Payer: Medicare Other | Admitting: Family Medicine

## 2020-12-22 ENCOUNTER — Encounter: Payer: Self-pay | Admitting: Family Medicine

## 2020-12-22 VITALS — Ht 62.0 in | Wt 185.0 lb

## 2020-12-22 DIAGNOSIS — J069 Acute upper respiratory infection, unspecified: Secondary | ICD-10-CM

## 2020-12-22 NOTE — Patient Instructions (Signed)
You have a cold and it should start to get better about 7 - 10 days after it started.    For your nasal congestion and runny nose, try using Afrin (generic is Oxymetazoline) twice daily for 3 days.  Do not use for longer that 3 days.    Some other therapies you can try are: push fluids, rest, use vaporizer or mist prn, use antihistamine-decongestant of choice prn, apply heat to sinuses prn, and return office visit prn if symptoms persist or worsen.   Drinking warm liquids such as teas and soups can help with secretions and cough. A mist humidifier or vaporizer can work well to help with secretions and cough.  It is very important to clean the humidifier between use according to the instructions.    It was good to see you.  If you're still having trouble in the next week, come back and see [place].    Of course, if you start having trouble breathing, worsening fevers, vomiting and unable to hold down any fluids, or you have other concerns, don't hesitate to come back or go to the ED after hours.   Make sure you continue to wear your mask when you are around others.

## 2020-12-22 NOTE — Progress Notes (Signed)
Virtual Visit via Video Note  I connected with [name] on 12/22/20 at  9:40 AM EDT by a video enabled telemedicine application and verified that I am speaking with the correct person using two identifiers.  Location: Patient: home Provider: [facility]   I discussed the limitations of evaluation and management by telemedicine and the availability of in person appointments. The patient expressed understanding and agreed to proceed.  History of Present Illness:  UPPER RESPIRATORY TRACT INFECTION - symptom onset: 12/16/20 - scratchy throat, drainage, hoarseness  Worst symptom: Fever: yes, Tmax 101F Cough: yes Shortness of breath: no Wheezing: no Chest pain: no Chest tightness: no Chest congestion: yes Nasal congestion: yes Runny nose: yes Sore throat: no Sinus pressure: yes Ear pain: yes bilateral Ear pressure: yes bilateral Eyes red/itching:no Eye drainage/crusting: no  Vomiting: no Sick contacts: no Relief with OTC cold/cough medications:  hasn't tried   Treatments attempted:  tylenol, zyrtec.    Observations/Objective:  Patient had trouble connecting to video visit, entirety of visit conducted over the phone.  Speaks in full sentences, no respiratory distress. Congested sounding.   Assessment and Plan:  Viral URI Mild sx. Not a candidate for COVID tx due to duration of symptoms, also outside quarantine window, no current need for testing.. Reviewed OTC symptom relief with decongestant and emergency precautions. F/u if still with copious secretions and fever >10 days, may warrant antibiotics at that time for bacterial sinusitis, can avoid abx at this time though.    I discussed the assessment and treatment plan with the patient. The patient was provided an opportunity to ask questions and all were answered. The patient agreed with the plan and demonstrated an understanding of the instructions.   The patient was advised to call back or seek an in-person evaluation  if the symptoms worsen or if the condition fails to improve as anticipated.  I provided 13 minutes of non-face-to-face time during this encounter.   Myles Gip, DO

## 2020-12-27 ENCOUNTER — Telehealth: Payer: Self-pay

## 2020-12-27 ENCOUNTER — Other Ambulatory Visit: Payer: Self-pay | Admitting: Family Medicine

## 2020-12-27 MED ORDER — AMOXICILLIN 875 MG PO TABS: 875.0000 mg | ORAL_TABLET | Freq: Two times a day (BID) | ORAL | 0 refills | Status: AC

## 2020-12-27 NOTE — Telephone Encounter (Signed)
Please advise 

## 2020-12-27 NOTE — Telephone Encounter (Signed)
Patient notified

## 2020-12-27 NOTE — Telephone Encounter (Signed)
Copied from [place] [id number] [id number]. Topic: General - Inquiry >> Dec 27, 2020  8:38 AM Scherrie Gerlach wrote: Reason for CRM: pt had video visit with Dr Ky Barban on 7/20.  Pt is not better, worse with sinus pain and pressure.  Pt advised to call back if not better.  Pt is not better.  Please advise

## 2020-12-28 ENCOUNTER — Ambulatory Visit: Payer: Medicare Other | Admitting: Podiatry

## 2021-01-06 ENCOUNTER — Other Ambulatory Visit: Payer: Self-pay | Admitting: Family Medicine

## 2021-01-10 ENCOUNTER — Ambulatory Visit: Payer: Medicare Other | Admitting: Podiatry

## 2021-01-12 ENCOUNTER — Other Ambulatory Visit: Payer: Self-pay | Admitting: Family Medicine

## 2021-01-12 DIAGNOSIS — E781 Pure hyperglyceridemia: Secondary | ICD-10-CM

## 2021-01-12 DIAGNOSIS — E785 Hyperlipidemia, unspecified: Secondary | ICD-10-CM

## 2021-01-12 NOTE — Telephone Encounter (Signed)
Next appt is 01/21/21

## 2021-01-12 NOTE — Telephone Encounter (Signed)
Notes to clinic:  Patient has appt schedule for 01/21/2021 Requesting short supply until then  Due for labs    Requested Prescriptions  Pending Prescriptions Disp Refills   fenofibrate (TRICOR) 145 MG tablet 90 tablet 3    Sig: Take 1 tablet (145 mg total) by mouth daily.      Cardiovascular:  Antilipid - Fibric Acid Derivatives Failed - 01/12/2021  8:13 AM      Failed - Total Cholesterol in normal range and within 360 days    Cholesterol, Total  Date Value Ref Range Status  02/22/2015 292 (H) 100 - 199 mg/dL Final   Cholesterol  Date Value Ref Range Status  07/12/2020 252 (H) <200 mg/dL Final          Failed - LDL in normal range and within 360 days    LDL Cholesterol (Calc)  Date Value Ref Range Status  07/12/2020  mg/dL (calc) Final    Comment:    . LDL cholesterol not calculated. Triglyceride levels greater than 400 mg/dL invalidate calculated LDL results. . Reference range: <100 . Desirable range <100 mg/dL for primary prevention;   <70 mg/dL for patients with CHD or diabetic patients  with > or = 2 CHD risk factors. [name] LDL-C is now calculated using the Martin-Hopkins  calculation, which is a validated novel method providing  better accuracy than the Friedewald equation in the  estimation of LDL-C.  Cresenciano Genre et al. Annamaria Helling. 9326;712(45): 2061-2068  (http://education.[employer].com/faq/FAQ164)           Failed - HDL in normal range and within 360 days    HDL  Date Value Ref Range Status  07/12/2020 36 (L) > OR = 50 mg/dL Final  02/22/2015 24 (L) >39 mg/dL Final    Comment:    According to ATP-III Guidelines, HDL-C >59 mg/dL is considered a negative risk factor for CHD.           Failed - Triglycerides in normal range and within 360 days    Triglycerides  Date Value Ref Range Status  07/12/2020 718 (H) <150 mg/dL Final    Comment:    . If a non-fasting specimen was collected, consider repeat triglyceride testing on a fasting specimen if  clinically indicated.  Yates Decamp et al. J. of Clin. Lipidol. 8099;8:338-250. . . There is increased risk of pancreatitis when the  triglyceride concentration is very high  (> or = 500 mg/dL, especially if > or = 1000 mg/dL).  Yates Decamp et al. J. of Clin. Lipidol. 5397;6:734-193. .           Failed - ALT in normal range and within 180 days    ALT  Date Value Ref Range Status  07/12/2020 18 6 - 29 U/L Final          Failed - AST in normal range and within 180 days    AST  Date Value Ref Range Status  07/12/2020 14 10 - 35 U/L Final          Failed - Cr in normal range and within 180 days    Creat  Date Value Ref Range Status  07/12/2020 0.56 0.50 - 0.99 mg/dL Final    Comment:    For patients >37 years of age, the reference limit for Creatinine is approximately 13% higher for people identified as African-American. .    Creatinine, POC  Date Value Ref Range Status  05/15/2016 NEG mg/dL Final          Failed -  eGFR in normal range and within 180 days    GFR, Est African American  Date Value Ref Range Status  07/12/2020 116 > OR = 60 mL/min/1.38m Final   GFR, Est Non African American  Date Value Ref Range Status  07/12/2020 100 > OR = 60 mL/min/1.769mFinal          Passed - Valid encounter within last 12 months    Recent Outpatient Visits           3 weeks ago [facility]al [facility]   2 months ago Benign essential hypertension   [facility]   5 months ago Rash and nonspecific skin eruption   [facility]   6 months ago Essential hypertension, benign   [facility]   6 months ago Acute right-sided low back pain with right-sided sciatica   [facility]       Future Appointments             In 1 week [facility]  In 7 months  [facility]

## 2021-01-12 NOTE — Telephone Encounter (Signed)
Medication Refill - Medication:  fenofibrate (TRICOR) 145 MG tablet  Has the patient contacted their pharmacy? No.  Preferred Pharmacy (with phone number or street name):  [place], [place] - [address] [place] [address]  Phone:  [phone]  Fax:  [phone]  Agent: Please be advised that RX refills may take up to 3 business days. We ask that you follow-up with your pharmacy.

## 2021-01-12 NOTE — Telephone Encounter (Signed)
Last seen 7.20.2022 (virtually)-upcoming appt 8.19.2022

## 2021-01-13 ENCOUNTER — Other Ambulatory Visit: Payer: Self-pay | Admitting: Family Medicine

## 2021-01-13 NOTE — Telephone Encounter (Signed)
Notes to clinic:  Patient has appt on 01/21/2021 Review for refill Due for labs and office visit   Requested Prescriptions  Pending Prescriptions Disp Refills   fenofibrate (TRICOR) 145 MG tablet 90 tablet 3    Sig: Take 1 tablet (145 mg total) by mouth daily.     Cardiovascular:  Antilipid - Fibric Acid Derivatives Failed - 01/13/2021  9:47 AM      Failed - Total Cholesterol in normal range and within 360 days    Cholesterol, Total  Date Value Ref Range Status  02/22/2015 292 (H) 100 - 199 mg/dL Final   Cholesterol  Date Value Ref Range Status  07/12/2020 252 (H) <200 mg/dL Final          Failed - LDL in normal range and within 360 days    LDL Cholesterol (Calc)  Date Value Ref Range Status  07/12/2020  mg/dL (calc) Final    Comment:    . LDL cholesterol not calculated. Triglyceride levels greater than 400 mg/dL invalidate calculated LDL results. . Reference range: <100 . Desirable range <100 mg/dL for primary prevention;   <70 mg/dL for patients with CHD or diabetic patients  with > or = 2 CHD risk factors. [name] LDL-C is now calculated using the Martin-Hopkins  calculation, which is a validated novel method providing  better accuracy than the Friedewald equation in the  estimation of LDL-C.  Cresenciano Genre et al. Annamaria Helling. 9166;060(04): 2061-2068  (http://education.[facility].com/faq/FAQ164)           Failed - HDL in normal range and within 360 days    HDL  Date Value Ref Range Status  07/12/2020 36 (L) > OR = 50 mg/dL Final  02/22/2015 24 (L) >39 mg/dL Final    Comment:    According to ATP-III Guidelines, HDL-C >59 mg/dL is considered a negative risk factor for CHD.           Failed - Triglycerides in normal range and within 360 days    Triglycerides  Date Value Ref Range Status  07/12/2020 718 (H) <150 mg/dL Final    Comment:    . If a non-fasting specimen was collected, consider repeat triglyceride testing on a fasting specimen if clinically  indicated.  Yates Decamp et al. J. of Clin. Lipidol. 5997;7:414-239. . . There is increased risk of pancreatitis when the  triglyceride concentration is very high  (> or = 500 mg/dL, especially if > or = 1000 mg/dL).  Yates Decamp et al. J. of Clin. Lipidol. 5320;2:334-356. .           Failed - ALT in normal range and within 180 days    ALT  Date Value Ref Range Status  07/12/2020 18 6 - 29 U/L Final          Failed - AST in normal range and within 180 days    AST  Date Value Ref Range Status  07/12/2020 14 10 - 35 U/L Final          Failed - Cr in normal range and within 180 days    Creat  Date Value Ref Range Status  07/12/2020 0.56 0.50 - 0.99 mg/dL Final    Comment:    For patients >52 years of age, the reference limit for Creatinine is approximately 13% higher for people identified as African-American. .    Creatinine, POC  Date Value Ref Range Status  05/15/2016 NEG mg/dL Final          Failed - eGFR in normal  range and within 180 days    GFR, Est African American  Date Value Ref Range Status  07/12/2020 116 > OR = 60 mL/min/1.69m Final   GFR, Est Non African American  Date Value Ref Range Status  07/12/2020 100 > OR = 60 mL/min/1.732mFinal          Passed - Valid encounter within last 12 months    Recent Outpatient Visits           3 weeks ago Viral URI   [facility]   2 months ago Benign essential hypertension   [facility]   5 months ago Rash and nonspecific skin eruption   [facility]   6 months ago Essential hypertension, benign   [facility]   6 months ago Acute right-sided low back pain with right-sided sciatica   [facility]       Future Appointments             In 1 week TaDelsa GranaPA-C [facility] In 7  months  [facility]

## 2021-01-13 NOTE — Telephone Encounter (Signed)
Copied from [place] [id number]. Topic: Quick Communication - Rx Refill/Question >> Jan 13, 2021  9:43 AM Tessa Lerner A wrote: Medication: fenofibrate (TRICOR) 145 MG tablet   Has the patient contacted their pharmacy? Yes.   (Agent: If no, request that the patient contact the pharmacy for the refill.) (Agent: If yes, when and what did the pharmacy advise?)  Preferred Pharmacy (with phone number or street name): [place], [place] - [place]  Phone:  [phone] Fax:  [phone]     Agent: Please be advised that RX refills may take up to 3 business days. We ask that you follow-up with your pharmacy.

## 2021-01-13 NOTE — Telephone Encounter (Signed)
Last seen on 12-22-2020 virtual

## 2021-01-20 MED ORDER — FENOFIBRATE 145 MG PO TABS: 145.0000 mg | ORAL_TABLET | Freq: Every day | ORAL | 3 refills | Status: DC

## 2021-01-21 ENCOUNTER — Other Ambulatory Visit: Payer: Self-pay

## 2021-01-21 ENCOUNTER — Encounter: Payer: Self-pay | Admitting: Family Medicine

## 2021-01-21 ENCOUNTER — Ambulatory Visit (INDEPENDENT_AMBULATORY_CARE_PROVIDER_SITE_OTHER): Payer: Medicare Other | Admitting: Family Medicine

## 2021-01-21 ENCOUNTER — Telehealth: Payer: Self-pay | Admitting: *Deleted

## 2021-01-21 VITALS — BP 122/82 | HR 77 | Temp 98.3°F | Resp 16 | Ht 62.0 in | Wt 180.8 lb

## 2021-01-21 DIAGNOSIS — K2100 Gastro-esophageal reflux disease with esophagitis, without bleeding: Secondary | ICD-10-CM

## 2021-01-21 DIAGNOSIS — E1121 Type 2 diabetes mellitus with diabetic nephropathy: Secondary | ICD-10-CM

## 2021-01-21 DIAGNOSIS — I10 Essential (primary) hypertension: Secondary | ICD-10-CM

## 2021-01-21 DIAGNOSIS — Z748 Other problems related to care provider dependency: Secondary | ICD-10-CM

## 2021-01-21 DIAGNOSIS — Z23 Encounter for immunization: Secondary | ICD-10-CM | POA: Diagnosis not present

## 2021-01-21 DIAGNOSIS — E785 Hyperlipidemia, unspecified: Secondary | ICD-10-CM | POA: Diagnosis not present

## 2021-01-21 DIAGNOSIS — Z599 Problem related to housing and economic circumstances, unspecified: Secondary | ICD-10-CM

## 2021-01-21 DIAGNOSIS — J45909 Unspecified asthma, uncomplicated: Secondary | ICD-10-CM | POA: Diagnosis not present

## 2021-01-21 DIAGNOSIS — D649 Anemia, unspecified: Secondary | ICD-10-CM | POA: Insufficient documentation

## 2021-01-21 DIAGNOSIS — Z794 Long term (current) use of insulin: Secondary | ICD-10-CM

## 2021-01-21 MED ORDER — PANTOPRAZOLE SODIUM 40 MG PO TBEC: 40.0000 mg | DELAYED_RELEASE_TABLET | Freq: Every day | ORAL | 2 refills | Status: DC | PRN

## 2021-01-21 MED ORDER — HYDROCHLOROTHIAZIDE 12.5 MG PO CAPS: 12.5000 mg | ORAL_CAPSULE | Freq: Every day | ORAL | 3 refills | Status: DC

## 2021-01-21 NOTE — Progress Notes (Signed)
Name: Olivia Romero   MRN: [id number]    DOB: 1956/02/02   Date:01/21/2021       Progress Note  Chief Complaint  Patient presents with   Hypertension   Hyperlipidemia     Subjective:   Olivia Romero is a 64 y.o. female, presents to clinic for   GERD -  sx a lot better on protonix - has been able to decreased use  Anemia Hemoglobin  Date Value Ref Range Status  07/12/2020 11.3 (L) 11.7 - 15.5 g/dL Final  05/16/2019 11.2 (L) 12.0 - 15.0 g/dL Final  12/11/2018 9.8 (L) 12.0 - 15.0 g/dL Final  12/11/2018 9.8 (L) 12.0 - 15.0 g/dL Final  12/04/2018 11.2 11.1 - 15.9 g/dL Final  07/12/2018 12.8 11.1 - 15.9 g/dL Final    DM:   Pt managing DM with metformin glimepiride  -  Denies: Polyuria, polydipsia, vision changes, neuropathy, hypoglycemia Recent pertinent labs: Lab Results  Component Value Date   HGBA1C 7.5 07/01/2020   HGBA1C 7.3 (H) 05/16/2019   HGBA1C 7.9 09/17/2018   Lab Results  Component Value Date   MICROALBUR 34.7 10/23/2017   [place]  07/12/2020     Comment:     . LDL cholesterol not calculated. Triglyceride levels greater than 400 mg/dL invalidate calculated LDL results. . Reference range: <100 . Desirable range <100 mg/dL for primary prevention;   <70 mg/dL for patients with CHD or diabetic patients  with > or = 2 CHD risk factors. [name] LDL-C is now calculated using the Martin-Hopkins  calculation, which is a validated novel method providing  better accuracy than the Friedewald equation in the  estimation of LDL-C.  Cresenciano Genre et al. Annamaria Helling. 0867;619(50): 2061-2068  (http://education.[facility].com/faq/FAQ164)    CREATININE 0.56 07/12/2020   Standard of care and health maintenance: Urine Microalbumin:  due Foot exam:  done DM eye exam:  done ACEI/ARB:  not able to take due to hyperkalemia  Statin:  fenofirate  Hyperlipidemia: Currently treated with ffenofibrate and fish oil suipplement - limited income, ran out of meds and money  - Last Lipids: Lab Results  Component Value Date   CHOL 252 (H) 07/12/2020   HDL 36 (L) 07/12/2020   [place]  07/12/2020     Comment:     . LDL cholesterol not calculated. Triglyceride levels greater than 400 mg/dL invalidate calculated LDL results. . Reference range: <100 . Desirable range <100 mg/dL for primary prevention;   <70 mg/dL for patients with CHD or diabetic patients  with > or = 2 CHD risk factors. [name] LDL-C is now calculated using the Martin-Hopkins  calculation, which is a validated novel method providing  better accuracy than the Friedewald equation in the  estimation of LDL-C.  Cresenciano Genre et al. Annamaria Helling. 9326;712(45): 2061-2068  (http://education.[facility].com/faq/FAQ164)    TRIG 718 (H) 07/12/2020   CHOLHDL 7.0 (H) 07/12/2020   - Denies: Chest pain, shortness of breath, myalgias, claudication    Current Outpatient Medications:    acetaminophen (TYLENOL) 500 MG tablet, Take 1,000 mg by mouth every 6 (six) hours as needed for mild pain. Maximum of 3,000 mg per day, Disp: , Rfl:    albuterol (VENTOLIN HFA) 108 (90 Base) MCG/ACT inhaler, INHALE 2 PUFFS INTO THE LUNGS EVERY 4 HOURS AS NEEDED, Disp: 6.7 g, Rfl: 0   allopurinol (ZYLOPRIM) 100 MG tablet, Take 1 tablet (100 mg total) by mouth 2 (two) times daily., Disp: 60 tablet, Rfl: 3   aspirin EC 325 MG tablet, Take 1  tablet (325 mg total) by mouth daily., Disp: 30 tablet, Rfl: 0   Blood Glucose Monitoring Suppl (CONTOUR NEXT MONITOR) w/Device KIT, as directed., Disp: , Rfl:    cetirizine (ZYRTEC ALLERGY) 10 MG tablet, Take 1 tablet (10 mg total) by mouth daily., Disp: 90 tablet, Rfl: 1   Cholecalciferol 25 MCG (1000 UT) tablet, Take by mouth., Disp: , Rfl:    Colchicine 0.6 MG CAPS, Take 0.6 mg by mouth 2 (two) times daily as needed., Disp: 60 capsule, Rfl: 1   DULoxetine (CYMBALTA) 60 MG capsule, Take 120 mg by mouth daily., Disp: , Rfl:    fenofibrate (TRICOR) 145 MG tablet, Take 1 tablet (145 mg total) by  mouth daily., Disp: 90 tablet, Rfl: 3   fenofibrate (TRICOR) 145 MG tablet, TAKE 1 TABLET BY MOUTH ONCE DAILY, Disp: 90 tablet, Rfl: 1   gabapentin (NEURONTIN) 100 MG capsule, Take by mouth., Disp: , Rfl:    gabapentin (NEURONTIN) 400 MG capsule, Take 400 mg by mouth 4 (four) times daily., Disp: , Rfl:    glimepiride (AMARYL) 2 MG tablet, Take 2 mg by mouth 2 (two) times daily. , Disp: , Rfl:    glucose blood (PRECISION QID TEST) test strip, , Disp: , Rfl:    hydrochlorothiazide (MICROZIDE) 12.5 MG capsule, TAKE 1 CAPSULE BY MOUTH ONCE DAILY, Disp: 90 capsule, Rfl: 3   Incontinence Supply Disposable (ASSURANCE FITTED BRIEF LARGE) MISC, Use 1 brief up to 6 times a day as needed for urinary incontinence, Disp: 180 each, Rfl: 5   insulin degludec (TRESIBA) 200 UNIT/ML FlexTouch Pen, Inject 20 Units into the skin at bedtime. , Disp: , Rfl:    metFORMIN (GLUCOPHAGE-XR) 500 MG 24 hr tablet, Take 500 mg by mouth 3 (three) times daily. , Disp: , Rfl:    pantoprazole (PROTONIX) 40 MG tablet, Take 1 tablet (40 mg total) by mouth daily as needed (Heartburn)., Disp: 90 tablet, Rfl: 2   tiZANidine (ZANAFLEX) 4 MG tablet, TAKE ONE HALF(2MG) TO ONE AND A HALF TABLETS (6MG) BY MOUTH EVERY 8 HOURS AS NEEDED FOR MUSCLE SPASMS/TIGHTNESS, Disp: 90 tablet, Rfl: 1   triamcinolone (KENALOG) 0.1 %, Apply 1 application topically 2 (two) times daily., Disp: 30 g, Rfl: 0   ULTICARE SHORT PEN NEEDLES 31G X 8 MM MISC, , Disp: , Rfl:    Vitamin D, Ergocalciferol, (DRISDOL) 1.25 MG (50000 UNIT) CAPS capsule, Take 1 capsule (50,000 Units total) by mouth every 7 (seven) days. x12 weeks., Disp: 12 capsule, Rfl: 0  Patient Active Problem List   Diagnosis Date Noted   Urticaria 10/21/2020   Rash and nonspecific skin eruption 07/27/2020   Hepatotoxicity due to statin drug 07/15/2020   Vitamin D deficiency 07/12/2020   Chronic low back pain 07/12/2020   BMI 36.0-36.9,adult 12/29/2019   Sepsis without acute organ dysfunction ([place])  12/29/2019   Post-operative state 12/29/2019   Asthma 12/29/2019   Cervical disc disorder 12/29/2019   Displacement of cervical intervertebral disc 12/29/2019   Abnormal posture 12/29/2019   Brachial radiculitis 12/29/2019   Neck pain 12/29/2019   Pain in upper limb 12/29/2019   Whiplash injury to neck 12/29/2019   Allergy to statin medication 12/01/2019   History of below knee amputation, left ([place]) 07/30/2019   Gangrene of left foot ([place]) 05/16/2019   Charcot's joint of foot, left    PAD (peripheral artery disease) ([place]) 12/05/2018   MRSA bacteremia 12/05/2018   Charcot's joint of foot due to diabetes ([place]) 09/18/2018   Class  2 severe obesity with serious comorbidity and body mass index (BMI) of 37.0 to 37.9 in adult ([place]) 11/19/2017   B12 deficiency 11/01/2017   Diabetic polyneuropathy associated with type 2 diabetes mellitus ([place]) 12/13/2015   Lumbar stenosis with neurogenic claudication 09/02/2015   Chronic constipation 07/13/2015   OSA (obstructive sleep apnea) 06/25/2015   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/23/2015   Hyperlipidemia 04/23/2015   Statin intolerance 04/23/2015   Asthma, mild intermittent 02/01/2015   Type 2 diabetes mellitus with diabetic nephropathy ([place]) 12/31/2014   Gastroesophageal reflux disease with esophagitis 12/31/2014   Dysphagia 12/31/2014   Bilateral carpal tunnel syndrome 12/01/2014   Diverticulosis of colon 11/30/2012   Benign essential hypertension 11/28/2012    Past Surgical History:  Procedure Laterality Date   ABDOMINAL HYSTERECTOMY     AMPUTATION Left 12/06/2018   Procedure: PARTIAL AMPUTATION LEFT FOOT;  Surgeon: Edrick Kins, DPM;  Location: [place];  Service: Podiatry;  Laterality: Left;   AMPUTATION Left 05/16/2019   Procedure: LEFT BELOW KNEE AMPUTATION;  Surgeon: Newt Minion, MD;  Location: [place];  Service: Orthopedics;  Laterality: Left;   BACK SURGERY  09/02/2015   BONE BIOPSY Left 12/06/2018   Procedure: Bone  Biopsy;  Surgeon: Edrick Kins, DPM;  Location: [place];  Service: Podiatry;  Laterality: Left;   [place]   COLONOSCOPY N/A 11/30/2012   Procedure: COLONOSCOPY;  Surgeon: Irene Shipper, MD;  Location: [facility] ENDOSCOPY;  Service: Endoscopy;  Laterality: N/A;   COLONOSCOPY WITH PROPOFOL N/A 01/03/2016   Procedure: COLONOSCOPY WITH PROPOFOL;  Surgeon: Manya Silvas, MD;  Location: [facility] ENDOSCOPY;  Service: Endoscopy;  Laterality: N/A;   ESOPHAGOGASTRODUODENOSCOPY (EGD) WITH PROPOFOL N/A 01/14/2015   Procedure: ESOPHAGOGASTRODUODENOSCOPY (EGD) WITH PROPOFOL;  Surgeon: Manya Silvas, MD;  Location: [facility] ENDOSCOPY;  Service: Endoscopy;  Laterality: N/A;   IRRIGATION AND DEBRIDEMENT FOOT Left 03/13/2018   Procedure: IRRIGATION AND DEBRIDEMENT FOOT WITH BONE BIOPSY WITH MISONIX DEBRIDER;  Surgeon: Evelina Bucy, DPM;  Location: [place];  Service: Podiatry;  Laterality: Left;   IRRIGATION AND DEBRIDEMENT FOOT Left 12/06/2018   Procedure: Irrigation And Debridement Foot;  Surgeon: Edrick Kins, DPM;  Location: [place];  Service: Podiatry;  Laterality: Left;   LEG AMPUTATION BELOW KNEE Left    LUMBAR WOUND DEBRIDEMENT N/A 10/01/2015   Procedure: LUMBAR WOUND DEBRIDEMENT;  Surgeon: Ashok Pall, MD;  Location: [facility];  Service: Neurosurgery;  Laterality: N/A;  LUMBAR WOUND DEBRIDEMENT   NASAL SINUS SURGERY     SAVORY DILATION N/A 01/14/2015   Procedure: SAVORY DILATION;  Surgeon: Manya Silvas, MD;  Location: [facility] ENDOSCOPY;  Service: Endoscopy;  Laterality: N/A;   TONSILLECTOMY     WISDOM TOOTH EXTRACTION      Family History  Problem Relation Age of Onset   Lung cancer Father    Hypertension Father    Arthritis Father    Other Mother        hardening of the arteries/renal cell carcenoma   Hypertension Mother    Stroke Mother    Kidney cancer Mother    Arthritis Brother    Rheum arthritis Maternal Uncle    Bladder Cancer Neg Hx     Social History    Tobacco Use   Smoking status: Never   Smokeless tobacco: Never  Vaping Use   Vaping Use: Never used  Substance Use Topics   Alcohol use: Never   Drug use:  No     Allergies  Allergen Reactions   Eggs Or Egg-Derived Products Swelling and Other (See Comments)    Angioedema   Atorvastatin Other (See Comments)    Liver toxicity   Influenza Virus Vaccine Swelling    Arm swelled (site of injection)   Latex Itching   Pravastatin Itching and Rash   Tape Rash and Other (See Comments)    Adhesive on foot pull off skin.  Ok to use paper tape.    Health Maintenance  Topic Date Due   Pneumococcal Vaccine 90-15 Years old (2 - PCV) 01/21/2015   HEMOGLOBIN A1C  12/29/2020   COVID-19 Vaccine (1) 02/06/2021 (Originally 11/18/1960)   Zoster Vaccines- Shingrix (1 of 2) 04/23/2021 (Originally 11/17/1976)   OPHTHALMOLOGY EXAM  07/29/2021   MAMMOGRAM  08/04/2021   FOOT EXAM  09/28/2021   TETANUS/TDAP  07/12/2025   COLONOSCOPY (Pts 45-63yr Insurance coverage will need to be confirmed)  01/02/2026   PNEUMOCOCCAL POLYSACCHARIDE VACCINE AGE 34-64 HIGH RISK  Completed   Hepatitis C Screening  Completed   HIV Screening  Completed   HPV VACCINES  Aged Out   INFLUENZA VACCINE  Discontinued   PAP SMEAR-Modifier  Discontinued    Chart Review Today: I personally reviewed active problem list, medication list, allergies, family history, social history, health maintenance, notes from last encounter, lab results, imaging with the patient/caregiver today.   Review of Systems  Constitutional: Negative.   HENT: Negative.    Eyes: Negative.   Respiratory: Negative.    Cardiovascular: Negative.   Gastrointestinal: Negative.   Endocrine: Negative.   Genitourinary: Negative.   Musculoskeletal: Negative.   Skin: Negative.   Allergic/Immunologic: Negative.   Neurological: Negative.   Hematological: Negative.   Psychiatric/Behavioral: Negative.    All other systems reviewed and are negative.    Objective:   Vitals:   01/21/21 0943  BP: 122/82  Pulse: 77  Resp: 16  Temp: 98.3 F (36.8 C)  SpO2: 99%  Weight: 180 lb 12.8 oz (82 kg)  Height: _0  (1.575 m)    Body mass index is 33.07 kg/m.  Physical Exam Vitals and nursing note reviewed.  Constitutional:      General: She is not in acute distress.    Appearance: Normal appearance. She is well-developed. She is obese. She is not ill-appearing, toxic-appearing or diaphoretic.     Interventions: Face mask in place.  HENT:     Head: Normocephalic and atraumatic.     Right Ear: External ear normal.     Left Ear: External ear normal.  Eyes:     General: Lids are normal. No scleral icterus.       Right eye: No discharge.        Left eye: No discharge.     Conjunctiva/sclera: Conjunctivae normal.  Neck:     Trachea: Phonation normal. No tracheal deviation.  Cardiovascular:     Rate and Rhythm: Normal rate and regular rhythm.     Pulses: Normal pulses.          Radial pulses are 2+ on the right side and 2+ on the left side.       Posterior tibial pulses are 2+ on the right side and 2+ on the left side.     Heart sounds: Normal heart sounds. No murmur heard.   No friction rub. No gallop.  Pulmonary:     Effort: Pulmonary effort is normal. No respiratory distress.     Breath sounds: Normal breath sounds.  No stridor. No wheezing, rhonchi or rales.  Chest:     Chest wall: No tenderness.  Abdominal:     General: Bowel sounds are normal. There is no distension.     Palpations: Abdomen is soft.  Musculoskeletal:     Right lower leg: No edema.     Comments: Left BKA with prosthesis  Skin:    General: Skin is warm and dry.     Coloration: Skin is not jaundiced or pale.     Findings: No rash.  Neurological:     Mental Status: She is alert.     Motor: No abnormal muscle tone.     Gait: Gait abnormal.  Psychiatric:        Attention and Perception: She is inattentive.        Mood and Affect: Mood is depressed.         Speech: Speech is tangential.        Behavior: Behavior is hyperactive.        Thought Content: Thought content does not include homicidal or suicidal ideation. Thought content does not include homicidal or suicidal plan.        Assessment & Plan:   Pharmacy - [facility] - hasn't consulted [facility] Mild MDD and stress with bills, limited finances, transportation and chronic pain - will consult SW and pharmacist   1. Benign essential hypertension Stable, well controlled, BP at goal today - hydrochlorothiazide (MICROZIDE) 12.5 MG capsule; Take 1 capsule (12.5 mg total) by mouth daily.  Dispense: 90 capsule; Refill: 3 - COMPLETE METABOLIC PANEL WITH GFR - AMB Referral to [place]  2. Hyperlipidemia, unspecified hyperlipidemia type Unable to take statin, on fenofibrate - COMPLETE METABOLIC PANEL WITH GFR - Lipid Panel w/reflex Direct LDL - AMB Referral to [place]  3. Moderate asthma, unspecified whether complicated, unspecified whether persistent Stable sx, well controlled  4. Type 2 diabetes mellitus with diabetic nephropathy, with long-term current use of insulin (HCC) Lab Results  Component Value Date   HGBA1C 7.2 (H) 01/21/2021  Per specialists - [facility] - Hemoglobin A1C - COMPLETE METABOLIC PANEL WITH GFR - Microalbumin, urine - AMB Referral to [place]  5. Gastroesophageal reflux disease with esophagitis, unspecified whether hemorrhage Improved - see below - pantoprazole (PROTONIX) 40 MG tablet; Take 1 tablet (40 mg total) by mouth daily as needed (Heartburn).  Dispense: 90 tablet; Refill: 2 - AMB Referral to [place]  6. Need for pneumococcal vaccination  - Pneumococcal conjugate vaccine 20-valent (Prevnar 20)  7. Gastroesophageal reflux disease with esophagitis, unspecified whether hemorrhage Improved sx, using less often - pantoprazole (PROTONIX) 40 MG tablet; Take 1 tablet  (40 mg total) by mouth daily as needed (Heartburn).  Dispense: 90 tablet; Refill: 2 - AMB Referral to [place]  8. Anemia, unspecified type Has been stable, no concerning sx or signs of bleeding, monitoring - CBC with Differential/Platelet  9. Dependent for transportation  - AMB Referral to [place]  10. Financial difficulties  - AMB Referral to [place]     Return in about 6 months (around 07/24/2021) for virtual f/up HTN/HLD/DM asthma.   Delsa Grana, PA-C 01/21/21 10:08 AM

## 2021-01-21 NOTE — Chronic Care Management (AMB) (Signed)
  Chronic Care Management   Outreach Note  01/21/2021 Name: [name] MRN: [id number] DOB: [birth year]  Olivia Romero is a 65 y.o. year old female who is a primary care patient of Delsa Grana, [place]. I reached out to [name] by phone today in response to a referral sent by Ms. Jamyiah A Yarbrough's PCP Delsa Grana, PA-C     An unsuccessful telephone outreach was attempted today. The patient was referred to the case management team for assistance with care management and care coordination.   Follow Up Plan: A HIPAA compliant phone message was left for the patient providing contact information and requesting a return call.  If patient returns call to provider office, please advise to call Embedded Care Management Care Guide Martese Vanatta at [place], [place] Management  Direct Dial: [phone]

## 2021-01-22 LAB — LIPID PANEL W/REFLEX DIRECT LDL
Cholesterol: 194 mg/dL (ref <200)
HDL: 44 mg/dL — ABNORMAL LOW (ref >=50)
LDL Cholesterol (Calc): 114 mg/dL (calc) — ABNORMAL HIGH
Non-HDL Cholesterol (Calc): 150 mg/dL (calc) — ABNORMAL HIGH (ref <130)
Total CHOL/HDL Ratio: 4.4 (calc) (ref <5.0)
Triglycerides: 255 mg/dL — ABNORMAL HIGH (ref <150)

## 2021-01-22 LAB — CBC WITH DIFFERENTIAL/PLATELET
Absolute Monocytes: 592 cells/uL (ref 200–950)
Basophils Absolute: 64 cells/uL (ref 0–200)
Basophils Relative: 0.7 %
Eosinophils Absolute: 346 cells/uL (ref 15–500)
Eosinophils Relative: 3.8 %
HCT: 37.7 % (ref 35.0–45.0)
Hemoglobin: 11.6 g/dL — ABNORMAL LOW (ref 11.7–15.5)
Lymphs Abs: 2803 cells/uL (ref 850–3900)
MCH: 24.8 pg — ABNORMAL LOW (ref 27.0–33.0)
MCHC: 30.8 g/dL — ABNORMAL LOW (ref 32.0–36.0)
MCV: 80.6 fL (ref 80.0–100.0)
MPV: 11.4 fL (ref 7.5–12.5)
Monocytes Relative: 6.5 %
Neutro Abs: 5296 cells/uL (ref 1500–7800)
Neutrophils Relative %: 58.2 %
Platelets: 330 10*3/uL (ref 140–400)
RBC: 4.68 10*6/uL (ref 3.80–5.10)
RDW: 14.9 % (ref 11.0–15.0)
Total Lymphocyte: 30.8 %
WBC: 9.1 10*3/uL (ref 3.8–10.8)

## 2021-01-22 LAB — COMPLETE METABOLIC PANEL WITH GFR
AG Ratio: 1.4 (calc) (ref 1.0–2.5)
ALT: 13 U/L (ref 6–29)
AST: 14 U/L (ref 10–35)
Albumin: 4.2 g/dL (ref 3.6–5.1)
Alkaline phosphatase (APISO): 66 U/L (ref 37–153)
BUN: 18 mg/dL (ref 7–25)
CO2: 28 mmol/L (ref 20–32)
Calcium: 9.5 mg/dL (ref 8.6–10.4)
Chloride: 103 mmol/L (ref 98–110)
Creat: 0.75 mg/dL (ref 0.50–1.05)
Globulin: 2.9 g/dL (calc) (ref 1.9–3.7)
Glucose, Bld: 94 mg/dL (ref 65–99)
Potassium: 4.9 mmol/L (ref 3.5–5.3)
Sodium: 140 mmol/L (ref 135–146)
Total Bilirubin: 0.3 mg/dL (ref 0.2–1.2)
Total Protein: 7.1 g/dL (ref 6.1–8.1)
eGFR: 89 mL/min/{1.73_m2} (ref >=60)

## 2021-01-22 LAB — HEMOGLOBIN A1C
Hgb A1c MFr Bld: 7.2 % of total Hgb — ABNORMAL HIGH (ref <5.7)
Mean Plasma Glucose: 160 mg/dL
eAG (mmol/L): 8.9 mmol/L

## 2021-01-22 LAB — MICROALBUMIN, URINE: Microalb, Ur: 0.9 mg/dL

## 2021-01-24 ENCOUNTER — Other Ambulatory Visit: Payer: Self-pay | Admitting: Family Medicine

## 2021-01-24 MED ORDER — EZETIMIBE 10 MG PO TABS: 10.0000 mg | ORAL_TABLET | Freq: Every day | ORAL | 3 refills | Status: DC

## 2021-01-24 NOTE — Chronic Care Management (AMB) (Signed)
  Chronic Care Management   Outreach Note  01/24/2021 Name: Olivia Romero MRN: [id number] DOB: March 15, 1956  Olivia Romero is a 65 y.o. year old female who is a primary care patient of Delsa Grana, [place]. I reached out to [name] by phone today in response to a referral sent by [name]'s PCP Delsa Grana, PA-C     A second unsuccessful telephone outreach was attempted today. The patient was referred to the case management team for assistance with care management and care coordination.   Follow Up Plan: A HIPAA compliant phone message was left for the patient providing contact information and requesting a return call.  If patient returns call to provider office, please advise to call Embedded Care Management Care Guide [name] at [place], [place] Management  Direct Dial: [phone]

## 2021-01-26 NOTE — Chronic Care Management (AMB) (Signed)
  Chronic Care Management   Note  01/26/2021 Name: VELORA HORSTMAN MRN: [id number] DOB: Mar 18, 1956  ALIECE HONOLD is a 65 y.o. year old female who is a primary care patient of Delsa Grana, [place]. I reached out to [name] by phone today in response to a referral sent by Ms. Manna A Rucci's PCP Delsa Grana, PA-C     Ms. Geving was given information about Chronic Care Management services today including:  CCM service includes personalized support from designated clinical staff supervised by her physician, including individualized plan of care and coordination with other care providers 24/7 contact phone numbers for assistance for urgent and routine care needs. Service will only be billed when office clinical staff spend 20 minutes or more in a month to coordinate care. Only one practitioner may furnish and bill the service in a calendar month. The patient may stop CCM services at any time (effective at the end of the month) by phone call to the office staff. The patient will be responsible for cost sharing (co-pay) of up to 20% of the service fee (after annual deductible is met).  Patient agreed to services and verbal consent obtained.   Follow up plan: Telephone appointment with care management team member scheduled for: 01/31/2021 and 03/02/2021  Julian Hy, [place] Management  Direct Dial: [phone]

## 2021-01-31 ENCOUNTER — Telehealth: Payer: Self-pay | Admitting: *Deleted

## 2021-01-31 ENCOUNTER — Encounter: Payer: Medicare Other | Admitting: *Deleted

## 2021-01-31 NOTE — Chronic Care Management (AMB) (Signed)
  Care Management   Note  01/31/2021 Name: Olivia Romero MRN: [id number] DOB: 1955/06/08  Olivia Romero is a 65 y.o. year old female who is a primary care patient of Laurell Roof and is actively engaged with the care management team. I reached out to [name] by phone today to assist with re-scheduling an initial visit with the Licensed Clinical Social Worker  Follow up plan: Unsuccessful telephone outreach attempt made. A HIPAA compliant phone message was left for the patient providing contact information and requesting a return call.  The care management team will reach out to the patient again over the next 7 days.  If patient returns call to provider office, please advise to call Corinne at [phone].  [place] Management  Direct Dial: [phone]

## 2021-02-01 NOTE — Chronic Care Management (AMB) (Addendum)
  Care Management   Note  02/01/2021 Name: Olivia Romero MRN: [id number] DOB: 1955/07/21  [name] is a 65 y.o. year old female who is a primary care patient of Laurell Roof and is actively engaged with the care management team. I reached out to [name] by phone today to assist with re-scheduling an initial visit with the Licensed Clinical Social Worker  Follow up plan: Telephone appointment with care management team member scheduled for:02/04/21  [place] Management  Direct Dial: [phone]

## 2021-02-01 NOTE — Progress Notes (Signed)
This encounter was created in error - please disregard.

## 2021-02-03 ENCOUNTER — Ambulatory Visit: Payer: Medicare Other | Admitting: Podiatry

## 2021-02-04 ENCOUNTER — Ambulatory Visit (INDEPENDENT_AMBULATORY_CARE_PROVIDER_SITE_OTHER): Payer: Medicare Other | Admitting: *Deleted

## 2021-02-04 DIAGNOSIS — K2100 Gastro-esophageal reflux disease with esophagitis, without bleeding: Secondary | ICD-10-CM

## 2021-02-04 DIAGNOSIS — I10 Essential (primary) hypertension: Secondary | ICD-10-CM

## 2021-02-04 NOTE — Chronic Care Management (AMB) (Signed)
Chronic Care Management    Clinical Social Work Note  02/04/2021 Name: Olivia Romero MRN: [id number] DOB: 01-30-1956  Olivia Romero is a 65 y.o. year old female who is a primary care patient of Delsa Grana, [place]. The CCM team was consulted to assist the patient with chronic disease management and/or care coordination needs related to: Mental Health Counseling and Resources.   Engaged with patient by telephone for initial visit in response to provider referral for social work chronic care management and care coordination services.   Consent to Services:  The patient was given the following information about Chronic Care Management services today, agreed to services, and gave verbal consent: 1. CCM service includes personalized support from designated clinical staff supervised by the primary care provider, including individualized plan of care and coordination with other care providers 2. 24/7 contact phone numbers for assistance for urgent and routine care needs. 3. Service will only be billed when office clinical staff spend 20 minutes or more in a month to coordinate care. 4. Only one practitioner may furnish and bill the service in a calendar month. 5.The patient may stop CCM services at any time (effective at the end of the month) by phone call to the office staff. 6. The patient will be responsible for cost sharing (co-pay) of up to 20% of the service fee (after annual deductible is met). Patient agreed to services and consent obtained.  Patient agreed to services and consent obtained.   Assessment: Review of patient past medical history, allergies, medications, and health status, including review of relevant consultants reports was performed today as part of a comprehensive evaluation and provision of chronic care management and care coordination services.     SDOH (Social Determinants of Health) assessments and interventions performed:    Advanced Directives Status: Not addressed in  this encounter.  CCM Care Plan  Allergies  Allergen Reactions   Eggs Or Egg-Derived Products Swelling and Other (See Comments)    Angioedema   Atorvastatin Other (See Comments)    Liver toxicity   Influenza Virus Vaccine Swelling    Arm swelled (site of injection)   Latex Itching   Pravastatin Itching and Rash   Tape Rash and Other (See Comments)    Adhesive on foot pull off skin.  Ok to use paper tape.    Outpatient Encounter Medications as of 02/04/2021  Medication Sig Note   acetaminophen (TYLENOL) 500 MG tablet Take 1,000 mg by mouth every 6 (six) hours as needed for mild pain. Maximum of 3,000 mg per day 10/21/2020: prn   albuterol (VENTOLIN HFA) 108 (90 Base) MCG/ACT inhaler INHALE 2 PUFFS INTO THE LUNGS EVERY 4 HOURS AS NEEDED    allopurinol (ZYLOPRIM) 100 MG tablet Take 1 tablet (100 mg total) by mouth 2 (two) times daily.    aspirin EC 325 MG tablet Take 1 tablet (325 mg total) by mouth daily.    Blood Glucose Monitoring Suppl (CONTOUR NEXT MONITOR) w/Device KIT as directed.    cetirizine (ZYRTEC ALLERGY) 10 MG tablet Take 1 tablet (10 mg total) by mouth daily.    Cholecalciferol 25 MCG (1000 UT) tablet Take by mouth.    Colchicine 0.6 MG CAPS Take 0.6 mg by mouth 2 (two) times daily as needed. 10/21/2020: prn   DULoxetine (CYMBALTA) 60 MG capsule Take 120 mg by mouth daily.    ezetimibe (ZETIA) 10 MG tablet Take 1 tablet (10 mg total) by mouth daily.    fenofibrate (TRICOR) 145 MG tablet  TAKE 1 TABLET BY MOUTH ONCE DAILY    gabapentin (NEURONTIN) 100 MG capsule Take by mouth. 08/24/2020: Taking 2 QHS   gabapentin (NEURONTIN) 400 MG capsule Take 400 mg by mouth 4 (four) times daily. 08/24/2020: Taking 2 BID   glimepiride (AMARYL) 2 MG tablet Take 2 mg by mouth 2 (two) times daily.     glucose blood (PRECISION QID TEST) test strip     hydrochlorothiazide (MICROZIDE) 12.5 MG capsule Take 1 capsule (12.5 mg total) by mouth daily.    Incontinence Supply Disposable (ASSURANCE FITTED  BRIEF LARGE) MISC Use 1 brief up to 6 times a day as needed for urinary incontinence    insulin degludec (TRESIBA) 200 UNIT/ML FlexTouch Pen Inject 20 Units into the skin at bedtime.     metFORMIN (GLUCOPHAGE-XR) 500 MG 24 hr tablet Take 500 mg by mouth 3 (three) times daily.     pantoprazole (PROTONIX) 40 MG tablet Take 1 tablet (40 mg total) by mouth daily as needed (Heartburn).    tiZANidine (ZANAFLEX) 4 MG tablet TAKE ONE HALF(2MG) TO ONE AND A HALF TABLETS (6MG) BY MOUTH EVERY 8 HOURS AS NEEDED FOR MUSCLE SPASMS/TIGHTNESS 10/21/2020: prn   triamcinolone (KENALOG) 0.1 % Apply 1 application topically 2 (two) times daily.    ULTICARE SHORT PEN NEEDLES 31G X 8 MM MISC     No facility-administered encounter medications on file as of 02/04/2021.    Patient Active Problem List   Diagnosis Date Noted   Anemia 01/21/2021   Urticaria 10/21/2020   Rash and nonspecific skin eruption 07/27/2020   Hepatotoxicity due to statin drug 07/15/2020   Vitamin D deficiency 07/12/2020   Chronic low back pain 07/12/2020   BMI 36.0-36.9,adult 12/29/2019   Sepsis without acute organ dysfunction ([place]) 12/29/2019   Post-operative state 12/29/2019   Asthma 12/29/2019   Cervical disc disorder 12/29/2019   Displacement of cervical intervertebral disc 12/29/2019   Abnormal posture 12/29/2019   Brachial radiculitis 12/29/2019   Neck pain 12/29/2019   Pain in upper limb 12/29/2019   Whiplash injury to neck 12/29/2019   Allergy to statin medication 12/01/2019   History of below knee amputation, left ([place]) 07/30/2019   Gangrene of left foot ([place]) 05/16/2019   Charcot's joint of foot, left    PAD (peripheral artery disease) ([place]) 12/05/2018   MRSA bacteremia 12/05/2018   Charcot's joint of foot due to diabetes ([place]) 09/18/2018   Class 2 severe obesity with serious comorbidity and body mass index (BMI) of 37.0 to 37.9 in adult ([place]) 11/19/2017   B12 deficiency 11/01/2017   Diabetic polyneuropathy associated with  type 2 diabetes mellitus ([place]) 12/13/2015   Lumbar stenosis with neurogenic claudication 09/02/2015   Chronic constipation 07/13/2015   OSA (obstructive sleep apnea) 06/25/2015   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/23/2015   Hyperlipidemia 04/23/2015   Statin intolerance 04/23/2015   Asthma, mild intermittent 02/01/2015   Type 2 diabetes mellitus with diabetic nephropathy ([place]) 12/31/2014   Gastroesophageal reflux disease with esophagitis 12/31/2014   Dysphagia 12/31/2014   Bilateral carpal tunnel syndrome 12/01/2014   Diverticulosis of colon 11/30/2012   Benign essential hypertension 11/28/2012    Conditions to be addressed/monitored: Depression; Mental Health Concerns   Care Plan : Depression (Adult)  Updates made by Vern Claude, LCSW since 02/04/2021 12:00 AM     Problem: Symptoms (Depression)      Goal: Symptoms Monitored and Managed   Start Date: 02/04/2021  Priority: Medium  Note:   Current Barriers:  Acute Mental Health needs related to current medical condition ADL IADL limitations Suicidal Ideation/Homicidal Ideation: No  Clinical Social Work Goal(s):  Over the next 90 days, patient will work with SW monthly by telephone or in person to reduce or manage symptoms related to depressed mood patient will follow up with this Education officer, museum to assess status of depressed mood  Interventions: Patient interviewed and appropriate assessments performed: PHQ 2 Patient discussed experiencing symptoms of depression related to her current medical condition-patient discussed being down during her last provider visit due to struggles with the loss of her leg and the inability to drive however is doing much better today Patient states that she utilizes [employer] to her medical appointments and the church [employer] transports her to church Patient confirms use of crutches and has a wheelchair ramp Patient discussed feeling alone, however is now able to identify a  positive support network of family and church members who are willing and able to provide assistance Patient confirmed having a strong faith and has changed her perspective regarding her condition Patient able to identify positive coping strategies including journaling and positive self talk Verbalization of feelings encouraged Active listening / Reflection utilized ,  Emotional Support Provided,  Positive coping strategies and self care reinforced  Patient Self Care Activities:  Self administers medications as prescribed Attends all scheduled provider appointments Calls pharmacy for medication refills Attends church or other social activities Performs ADL's independently Performs IADL's independently Ability for insight Independent living Strong family or social support  Patient Coping Strengths:  Supportive [place] to Communicate Effectively  Initial goal documentation     Task: Alleviate Barriers to Depression Treatment        Follow Up Plan: SW will follow up with patient by phone over the next 30 days to follow up on status of mood      Azalee Weimer, [place] Worker  [facility] Care Management [phone]

## 2021-02-04 NOTE — Patient Instructions (Signed)
Visit Information  PATIENT GOALS:   Goals Addressed             This Visit's Progress    Manage My Emotions       Timeframe:  Long-Range Goal Priority:  Medium Start Date:         02/04/21                    Expected End Date:  06/06/20                     Follow Up Date 03/07/21    - start or continue a personal journal - talk about feelings with a friend, family or spiritual advisor - practice positive thinking and self-talk    Why is this important?   When you are stressed, down or upset, your body reacts too.  For example, your blood pressure may get higher; you may have a headache or stomachache.  When your emotions get the best of you, your body's ability to fight off cold and flu gets weak.  These steps will help you manage your emotions.     Notes:         Ms. Janicke was given information about Care Management services by the embedded care coordination team including:  Care Management services include personalized support from designated clinical staff supervised by her physician, including individualized plan of care and coordination with other care providers 24/7 contact phone numbers for assistance for urgent and routine care needs. The patient may stop CCM services at any time (effective at the end of the month) by phone call to the office staff.  Patient agreed to services and verbal consent obtained.   The patient verbalized understanding of instructions, educational materials, and care plan provided today and declined offer to receive copy of patient instructions, educational materials, and care plan.   Telephone follow up appointment with care management team member scheduled for:03/07/21  [name], [place] Worker  [facility] [phone]

## 2021-02-10 ENCOUNTER — Ambulatory Visit: Payer: Medicare Other | Admitting: Family Medicine

## 2021-02-11 ENCOUNTER — Telehealth: Payer: Self-pay

## 2021-02-11 NOTE — Telephone Encounter (Signed)
Copied from [place] [id number]. Topic: General - Other >> Feb 11, 2021  2:58 PM Pawlus, Brayton Layman A wrote: Reason for CRM: [facility] was calling to see if the provider would approve a ride to [facility], normally [facility] only takes pts to other [place] locations but wanted to see if this could be approved, please advise.

## 2021-02-11 NOTE — Telephone Encounter (Signed)
Approved.  

## 2021-02-22 ENCOUNTER — Other Ambulatory Visit: Payer: Self-pay

## 2021-02-22 ENCOUNTER — Ambulatory Visit (INDEPENDENT_AMBULATORY_CARE_PROVIDER_SITE_OTHER): Payer: Medicare Other | Admitting: Podiatrist

## 2021-02-22 ENCOUNTER — Encounter: Payer: Self-pay | Admitting: Podiatrist

## 2021-02-22 DIAGNOSIS — B351 Tinea unguium: Secondary | ICD-10-CM | POA: Diagnosis not present

## 2021-02-22 DIAGNOSIS — E1149 Type 2 diabetes mellitus with other diabetic neurological complication: Secondary | ICD-10-CM

## 2021-02-22 DIAGNOSIS — S88112A Complete traumatic amputation at level between knee and ankle, left lower leg, initial encounter: Secondary | ICD-10-CM

## 2021-02-22 NOTE — Progress Notes (Signed)
Subjective: 65 y.o. returns the office today for painful, elongated, thickened toenails which she cannot trim herself.  Also presents today for diabetic foot exam.  States that she has been doing well and she is not seeing any open sores.  No swelling or changes.  No injuries.  She still continue the [place] brace on the right side. Left side amputation with use of a prosthetic noted.    PCP: Delsa Grana, PA-C   Objective: AAO 3, NAD DP/PT pulses palpable, CRT less than 3 seconds Left below-knee amputation Sensation absent with SWMF at 0/10 sites.  Nails x 5 right foot are hypertrophic, dystrophic, elongated, brittle, discolored. There is no surrounding erythema or drainage along the nail sites. Prominence of the fourth metatarsal head plantarly without any hyperkeratotic tissue.  There is no open lesions identified. No pain with calf compression, swelling, warmth, erythema.   Assessment:  1. Dermatophytosis of nail   2. Type II diabetes mellitus with neurological manifestations ([place])   3. Below-knee amputation of left lower extremity ([facility])       Plan: -Treatment options including alternatives, risks, complications were discussed -Nails sharply debrided 5 without complication/bleeding. -Continue to monitor right foot daily.  Moisturizer daily. -Continue [place] brace on the right side for stability and offloading. -Discussed daily foot inspection. If there are any changes, to call the office immediately.  -Follow-up as scheduled or sooner if any problems are to arise. In the meantime, encouraged to call the office with any questions, concerns, changes symptoms.

## 2021-02-28 ENCOUNTER — Emergency Department (HOSPITAL_COMMUNITY)
Admission: EM | Admit: 2021-02-28 | Discharge: 2021-02-28 | Disposition: A | Discharge status: Home / Self Care | Payer: Medicare Other | Attending: Emergency Medicine | Admitting: Emergency Medicine

## 2021-02-28 ENCOUNTER — Emergency Department (HOSPITAL_COMMUNITY): Payer: Medicare Other

## 2021-02-28 ENCOUNTER — Telehealth: Payer: Self-pay

## 2021-02-28 ENCOUNTER — Ambulatory Visit: Payer: Medicare Other | Admitting: Podiatry

## 2021-02-28 DIAGNOSIS — I10 Essential (primary) hypertension: Secondary | ICD-10-CM | POA: Diagnosis not present

## 2021-02-28 DIAGNOSIS — Z794 Long term (current) use of insulin: Secondary | ICD-10-CM | POA: Diagnosis not present

## 2021-02-28 DIAGNOSIS — E1121 Type 2 diabetes mellitus with diabetic nephropathy: Secondary | ICD-10-CM | POA: Diagnosis not present

## 2021-02-28 DIAGNOSIS — Z91040 Latex allergy status: Secondary | ICD-10-CM | POA: Insufficient documentation

## 2021-02-28 DIAGNOSIS — S0083XA Contusion of other part of head, initial encounter: Secondary | ICD-10-CM | POA: Diagnosis not present

## 2021-02-28 DIAGNOSIS — Z7982 Long term (current) use of aspirin: Secondary | ICD-10-CM | POA: Diagnosis not present

## 2021-02-28 DIAGNOSIS — E1142 Type 2 diabetes mellitus with diabetic polyneuropathy: Secondary | ICD-10-CM | POA: Insufficient documentation

## 2021-02-28 DIAGNOSIS — E1143 Type 2 diabetes mellitus with diabetic autonomic (poly)neuropathy: Secondary | ICD-10-CM | POA: Insufficient documentation

## 2021-02-28 DIAGNOSIS — R55 Syncope and collapse: Secondary | ICD-10-CM | POA: Diagnosis not present

## 2021-02-28 DIAGNOSIS — M545 Low back pain, unspecified: Secondary | ICD-10-CM | POA: Insufficient documentation

## 2021-02-28 DIAGNOSIS — Z7984 Long term (current) use of oral hypoglycemic drugs: Secondary | ICD-10-CM | POA: Diagnosis not present

## 2021-02-28 DIAGNOSIS — W01198A Fall on same level from slipping, tripping and stumbling with subsequent striking against other object, initial encounter: Secondary | ICD-10-CM | POA: Diagnosis not present

## 2021-02-28 DIAGNOSIS — Y92009 Unspecified place in unspecified non-institutional (private) residence as the place of occurrence of the external cause: Secondary | ICD-10-CM | POA: Diagnosis not present

## 2021-02-28 DIAGNOSIS — J45909 Unspecified asthma, uncomplicated: Secondary | ICD-10-CM | POA: Diagnosis not present

## 2021-02-28 DIAGNOSIS — W19XXXA Unspecified fall, initial encounter: Secondary | ICD-10-CM

## 2021-02-28 DIAGNOSIS — Z79899 Other long term (current) drug therapy: Secondary | ICD-10-CM | POA: Insufficient documentation

## 2021-02-28 DIAGNOSIS — S42292A Other displaced fracture of upper end of left humerus, initial encounter for closed fracture: Secondary | ICD-10-CM | POA: Diagnosis not present

## 2021-02-28 DIAGNOSIS — S43015A Anterior dislocation of left humerus, initial encounter: Secondary | ICD-10-CM | POA: Insufficient documentation

## 2021-02-28 DIAGNOSIS — S4992XA Unspecified injury of left shoulder and upper arm, initial encounter: Secondary | ICD-10-CM | POA: Diagnosis present

## 2021-02-28 MED ORDER — FENTANYL CITRATE PF 50 MCG/ML IJ SOSY
50.0000 ug | PREFILLED_SYRINGE | Freq: Once | INTRAMUSCULAR | Status: DC
Start: 2021-02-28 — End: 2021-03-01

## 2021-02-28 MED ORDER — FENTANYL CITRATE PF 50 MCG/ML IJ SOSY
100.0000 ug | PREFILLED_SYRINGE | INTRAMUSCULAR | Status: AC | PRN
  Administered 2021-02-28 (×2): 100 ug via INTRAVENOUS
  Filled 2021-02-28 (×2): qty 2

## 2021-02-28 MED ORDER — HYDROCODONE-ACETAMINOPHEN 5-325 MG PO TABS: 2.0000 | ORAL_TABLET | ORAL | 0 refills | Status: DC | PRN

## 2021-02-28 MED ORDER — OXYCODONE-ACETAMINOPHEN 5-325 MG PO TABS
1.0000 | ORAL_TABLET | Freq: Once | ORAL | Status: AC
  Administered 2021-02-28: 1 via ORAL
  Filled 2021-02-28: qty 1

## 2021-02-28 MED ORDER — ACETAMINOPHEN 325 MG PO TABS
650.0000 mg | ORAL_TABLET | Freq: Once | ORAL | Status: AC
  Administered 2021-02-28: 650 mg via ORAL
  Filled 2021-02-28: qty 2

## 2021-02-28 MED ORDER — LIDOCAINE HCL (PF) 1 % IJ SOLN
20.0000 mL | Freq: Once | INTRAMUSCULAR | Status: AC
  Administered 2021-02-28: 20 mL
  Filled 2021-02-28: qty 20

## 2021-02-28 NOTE — ED Provider Notes (Signed)
[facility] EMERGENCY DEPARTMENT Provider Note   CSN: [id number] Arrival date & time: 02/28/21  1446     History Chief Complaint  Patient presents with   Lytle Michaels    Olivia Romero is a 65 y.o. female.  HPI Patient is a 65 year old female with a past medical history significant for DM2, left foot infection s/p amputation  Patient complains of mechanical fall that occurred at home when her left lower extremity prosthesis fell off.  She fell to the ground and struck her head on the ground did not lose consciousness some blood thinners.  Is complaining of left shoulder and elbow pain.  States she did hit her head but on LOC. She states she has a left sided headache and can feel some swelling and TTP when she touches there. No blood thinners.   No CP/SOB and denies any LH/Dizzy sx. States she only fell because her prosthesis fell off. States she's had issues with her prothsthesis in the past.   Mostly complaining of left shoulder pain currently.   States her pain is constant achy and located in left shoulder. States it is 8/10 States she does have achy frontal headache that is 3/10 No NV or confusion  Does complain of low back pain as well  No other significant complaints.      Past Medical History:  Diagnosis Date   Anemia    Arthritis    Asthma    Colon polyps    adenomatous   Diabetes mellitus without complication ([place])    Diabetic infection of left foot ([place]) 12/2018   Diverticulosis of colon    Esophagitis    GERD (gastroesophageal reflux disease)    Hemorrhoid    internal   Hyperlipemia    Hypertension    IBS (irritable bowel syndrome)    no current prob - diet controlled   Myalgia due to statin 11/19/2017   Neuropathy    Neuropathy of both feet    Neuropathy of hand    Pneumonia    x 4   PONV (postoperative nausea and vomiting)    on some surgeries but not all procedures   Skin ulcer of right ankle, limited to breakdown of skin ([place])  12/31/2016   resolved per patient 05/14/19   Sleep apnea    has had in the past lost 50 pounds and do longer uses cpap   Statin intolerance 04/23/2015   Stroke ([place]) 2009   mini stroke per patient   Stroke-like episode 2009   TIA - mini stroke per patient   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 ([place]) 07/13/2015   Uncontrolled type 2 diabetes mellitus with hyperglycemia ([place]) 11/01/2017    Patient Active Problem List   Diagnosis Date Noted   Anemia 01/21/2021   Urticaria 10/21/2020   Rash and nonspecific skin eruption 07/27/2020   Hepatotoxicity due to statin drug 07/15/2020   Vitamin D deficiency 07/12/2020   Chronic low back pain 07/12/2020   BMI 36.0-36.9,adult 12/29/2019   Sepsis without acute organ dysfunction ([place]) 12/29/2019   Post-operative state 12/29/2019   Asthma 12/29/2019   Cervical disc disorder 12/29/2019   Displacement of cervical intervertebral disc 12/29/2019   Abnormal posture 12/29/2019   Brachial radiculitis 12/29/2019   Neck pain 12/29/2019   Pain in upper limb 12/29/2019   Whiplash injury to neck 12/29/2019   Allergy to statin medication 12/01/2019   History of below knee amputation, left ([place]) 07/30/2019   Gangrene of left foot ([place])  05/16/2019   Charcot's joint of foot, left    PAD (peripheral artery disease) ([place]) 12/05/2018   MRSA bacteremia 12/05/2018   Charcot's joint of foot due to diabetes ([place]) 09/18/2018   Class 2 severe obesity with serious comorbidity and body mass index (BMI) of 37.0 to 37.9 in adult ([place]) 11/19/2017   B12 deficiency 11/01/2017   Diabetic polyneuropathy associated with type 2 diabetes mellitus ([place]) 12/13/2015   Lumbar stenosis with neurogenic claudication 09/02/2015   Chronic constipation 07/13/2015   OSA (obstructive sleep apnea) 06/25/2015   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/23/2015   Hyperlipidemia 04/23/2015   Statin intolerance 04/23/2015   Asthma, mild intermittent 02/01/2015    Type 2 diabetes mellitus with diabetic nephropathy ([place]) 12/31/2014   Gastroesophageal reflux disease with esophagitis 12/31/2014   Dysphagia 12/31/2014   Bilateral carpal tunnel syndrome 12/01/2014   Diverticulosis of colon 11/30/2012   Benign essential hypertension 11/28/2012    Past Surgical History:  Procedure Laterality Date   ABDOMINAL HYSTERECTOMY     AMPUTATION Left 12/06/2018   Procedure: PARTIAL AMPUTATION LEFT FOOT;  Surgeon: Edrick Kins, DPM;  Location: [place];  Service: Podiatry;  Laterality: Left;   AMPUTATION Left 05/16/2019   Procedure: LEFT BELOW KNEE AMPUTATION;  Surgeon: Newt Minion, MD;  Location: [place];  Service: Orthopedics;  Laterality: Left;   BACK SURGERY  09/02/2015   BONE BIOPSY Left 12/06/2018   Procedure: Bone Biopsy;  Surgeon: Edrick Kins, DPM;  Location: [place];  Service: Podiatry;  Laterality: Left;   [place]   COLONOSCOPY N/A 11/30/2012   Procedure: COLONOSCOPY;  Surgeon: Irene Shipper, MD;  Location: [facility] ENDOSCOPY;  Service: Endoscopy;  Laterality: N/A;   COLONOSCOPY WITH PROPOFOL N/A 01/03/2016   Procedure: COLONOSCOPY WITH PROPOFOL;  Surgeon: Manya Silvas, MD;  Location[facility] ENDOSCOPY;  Service: Endoscopy;  Laterality: N/A;   ESOPHAGOGASTRODUODENOSCOPY (EGD) WITH PROPOFOL N/A 01/14/2015   Procedure: ESOPHAGOGASTRODUODENOSCOPY (EGD) WITH PROPOFOL;  Surgeon: Manya Silvas, MD;  Location: [facility] ENDOSCOPY;  Service: Endoscopy;  Laterality: N/A;   IRRIGATION AND DEBRIDEMENT FOOT Left 03/13/2018   Procedure: IRRIGATION AND DEBRIDEMENT FOOT WITH BONE BIOPSY WITH MISONIX DEBRIDER;  Surgeon: Evelina Bucy, DPM;  Location: [place];  Service: Podiatry;  Laterality: Left;   IRRIGATION AND DEBRIDEMENT FOOT Left 12/06/2018   Procedure: Irrigation And Debridement Foot;  Surgeon: Edrick Kins, DPM;  Location: [place];  Service: Podiatry;  Laterality: Left;   LEG AMPUTATION BELOW KNEE Left    LUMBAR WOUND DEBRIDEMENT N/A  10/01/2015   Procedure: LUMBAR WOUND DEBRIDEMENT;  Surgeon: Ashok Pall, MD;  Location: [place] NEURO ORS;  Service: Neurosurgery;  Laterality: N/A;  LUMBAR WOUND DEBRIDEMENT   NASAL SINUS SURGERY     SAVORY DILATION N/A 01/14/2015   Procedure: SAVORY DILATION;  Surgeon: Manya Silvas, MD;  Location: [facility] ENDOSCOPY;  Service: Endoscopy;  Laterality: N/A;   TONSILLECTOMY     WISDOM TOOTH EXTRACTION       OB History   No obstetric history on file.     Family History  Problem Relation Age of Onset   Lung cancer Father    Hypertension Father    Arthritis Father    Other Mother        hardening of the arteries/renal cell carcenoma   Hypertension Mother    Stroke Mother    Kidney cancer Mother    Arthritis Brother    Rheum arthritis Maternal  Uncle    Bladder Cancer Neg Hx     Social History   Tobacco Use   Smoking status: Never   Smokeless tobacco: Never  Vaping Use   Vaping Use: Never used  Substance Use Topics   Alcohol use: Never   Drug use: No    Home Medications Prior to Admission medications   Medication Sig Start Date End Date Taking? Authorizing Provider  acetaminophen (TYLENOL) 500 MG tablet Take 1,000 mg by mouth every 6 (six) hours as needed for mild pain. Maximum of 3,000 mg per day 09/17/18  Yes Lada, Satira Anis, MD  albuterol (VENTOLIN HFA) 108 (90 Base) MCG/ACT inhaler INHALE 2 PUFFS INTO THE LUNGS EVERY 4 HOURS AS NEEDED 01/06/21  Yes Delsa Grana, PA-C  allopurinol (ZYLOPRIM) 100 MG tablet Take 1 tablet (100 mg total) by mouth 2 (two) times daily. 03/25/20  Yes Newt Minion, MD  aspirin EC 325 MG tablet Take 1 tablet (325 mg total) by mouth daily. 03/11/18  Yes Sowles, Drue Stager, MD  HYDROcodone-acetaminophen (NORCO/VICODIN) 5-325 MG tablet Take 2 tablets by mouth every 4 (four) hours as needed. 02/28/21  Yes Yashua Bracco S, PA  Blood Glucose Monitoring Suppl (CONTOUR NEXT MONITOR) w/Device KIT as directed. 06/29/17   [provider]  cetirizine (ZYRTEC  ALLERGY) 10 MG tablet Take 1 tablet (10 mg total) by mouth daily. Patient not taking: Reported on 02/28/2021 10/21/20   Kathrine Haddock, NP  Colchicine 0.6 MG CAPS Take 0.6 mg by mouth 2 (two) times daily as needed. Patient not taking: Reported on 02/28/2021 03/25/20   Newt Minion, MD  DULoxetine (CYMBALTA) 60 MG capsule Take 120 mg by mouth daily. 07/01/20   [provider]  ezetimibe (ZETIA) 10 MG tablet Take 1 tablet (10 mg total) by mouth daily. 01/24/21   Delsa Grana, PA-C  fenofibrate (TRICOR) 145 MG tablet TAKE 1 TABLET BY MOUTH ONCE DAILY 01/13/21   Delsa Grana, PA-C  gabapentin (NEURONTIN) 100 MG capsule Take by mouth. 05/19/20   [provider]  gabapentin (NEURONTIN) 400 MG capsule Take 400 mg by mouth 4 (four) times daily.    [provider]  glimepiride (AMARYL) 2 MG tablet Take 2 mg by mouth 2 (two) times daily.  10/29/15   [provider]  hydrochlorothiazide (MICROZIDE) 12.5 MG capsule Take 1 capsule (12.5 mg total) by mouth daily. 01/21/21   Delsa Grana, PA-C  HYDROcodone-acetaminophen (NORCO/VICODIN) 5-325 MG tablet Take 1-2 tablets by mouth every 6 (six) hours as needed for moderate pain. 03/01/21   Newt Minion, MD  Incontinence Supply Disposable (ASSURANCE FITTED BRIEF LARGE) MISC Use 1 brief up to 6 times a day as needed for urinary incontinence 10/16/19   Delsa Grana, PA-C  insulin degludec (TRESIBA) 200 UNIT/ML FlexTouch Pen Inject 20 Units into the skin at bedtime.     [provider]  metFORMIN (GLUCOPHAGE-XR) 500 MG 24 hr tablet Take 500 mg by mouth 3 (three) times daily.  01/01/19   [provider]  pantoprazole (PROTONIX) 40 MG tablet Take 1 tablet (40 mg total) by mouth daily as needed (Heartburn). 01/21/21   Delsa Grana, PA-C  tiZANidine (ZANAFLEX) 4 MG tablet TAKE ONE HALF(2MG) TO ONE AND A HALF TABLETS (6MG) BY MOUTH EVERY 8 HOURS AS NEEDED FOR MUSCLE SPASMS/TIGHTNESS 09/09/20   Delsa Grana, PA-C  triamcinolone  (KENALOG) 0.1 % Apply 1 application topically 2 (two) times daily. 07/19/20   Pearson Forster, NP  Flossie Buffy SHORT PEN NEEDLES 31G X  8 MM MISC  01/09/19   [provider]    Allergies    Eggs or egg-derived products, Atorvastatin, Influenza virus vaccine, Latex, Pravastatin, and Tape  Review of Systems   Review of Systems  Constitutional:  Negative for chills and fever.  HENT:  Negative for congestion.   Eyes:  Negative for pain.  Respiratory:  Negative for cough and shortness of breath.   Cardiovascular:  Negative for chest pain and leg swelling.  Gastrointestinal:  Negative for abdominal pain and vomiting.  Genitourinary:  Negative for dysuria.  Musculoskeletal:  Positive for arthralgias. Negative for myalgias.  Skin:  Negative for rash.  Neurological:  Positive for headaches. Negative for dizziness.   Physical Exam Updated Vital Signs BP (!) 148/75   Pulse 95   Temp 98.3 F (36.8 C) (Oral)   Resp 18   Ht 5' (1.524 m)   Wt 82.1 kg   LMP  (LMP Unknown)   SpO2 94%   BMI 35.35 kg/m   Physical Exam Vitals and nursing note reviewed.  Constitutional:      General: She is not in acute distress.    Appearance: She is obese.  HENT:     Head: Normocephalic.     Comments: Large left face hematoma over eyebrow    Nose: Nose normal.  Eyes:     General: No scleral icterus.    Comments: EOMI  Cardiovascular:     Rate and Rhythm: Normal rate and regular rhythm.     Pulses: Normal pulses.     Heart sounds: Normal heart sounds.  Pulmonary:     Effort: Pulmonary effort is normal. No respiratory distress.     Breath sounds: No wheezing.  Abdominal:     Palpations: Abdomen is soft.     Tenderness: There is no abdominal tenderness.     Comments: Soft nonTTP  Musculoskeletal:     Cervical back: Normal range of motion.     Right lower leg: No edema.     Left lower leg: No edema.     Comments: Left bka Right leg nontender  L shoulder without obvious deformity. Guarding  ROM d/t discomfort distal pulses 3+. TTP of left shoulder no clavicular TTP. Some diffuse LUE ttp esp at elbow.   RUE unremarkable  Mild midline C and L spine TTP No Tspine TTP No step off or deformity  Skin:    General: Skin is warm and dry.     Capillary Refill: Capillary refill takes less than 2 seconds.  Neurological:     Mental Status: She is alert. Mental status is at baseline.  Psychiatric:        Mood and Affect: Mood normal.        Behavior: Behavior normal.    ED Results / Procedures / Treatments   Labs (all labs ordered are listed, but only abnormal results are displayed) Labs Reviewed - No data to display  EKG None  Radiology DG Elbow Complete Left  Result Date: 02/28/2021 CLINICAL DATA:  Fall, pain EXAM: LEFT ELBOW - COMPLETE 3+ VIEW; LEFT HUMERUS - 2+ VIEW; LEFT WRIST - COMPLETE 3+ VIEW; LEFT SHOULDER - 2+ VIEW COMPARISON:  None. FINDINGS: There is anterior dislocation of the left glenohumeral joint, with displaced fractures of the greater tuberosity and surgical neck of the left humerus. Mild acromioclavicular and glenohumeral arthrosis. No fracture or dislocation of the left elbow. No elbow joint effusion. Severe elbow joint arthrosis. No fracture or dislocation of the left wrist. The  carpus is normally aligned. Soft tissues are unremarkable. IMPRESSION: 1. Anterior dislocation of the left glenohumeral joint. 2. Displaced fractures of the greater tuberosity and surgical neck of the left humerus. Consider CT to further evaluate fracture anatomy. 3. No fracture or dislocation of the left elbow.  Severe arthrosis. 4. No fracture or dislocation of the left wrist. The carpus is normally aligned. Electronically Signed   By: Eddie Candle M.D.   On: 02/28/2021 16:42   DG Wrist Complete Left  Result Date: 02/28/2021 CLINICAL DATA:  Fall, pain EXAM: LEFT ELBOW - COMPLETE 3+ VIEW; LEFT HUMERUS - 2+ VIEW; LEFT WRIST - COMPLETE 3+ VIEW; LEFT SHOULDER - 2+ VIEW COMPARISON:  None.  FINDINGS: There is anterior dislocation of the left glenohumeral joint, with displaced fractures of the greater tuberosity and surgical neck of the left humerus. Mild acromioclavicular and glenohumeral arthrosis. No fracture or dislocation of the left elbow. No elbow joint effusion. Severe elbow joint arthrosis. No fracture or dislocation of the left wrist. The carpus is normally aligned. Soft tissues are unremarkable. IMPRESSION: 1. Anterior dislocation of the left glenohumeral joint. 2. Displaced fractures of the greater tuberosity and surgical neck of the left humerus. Consider CT to further evaluate fracture anatomy. 3. No fracture or dislocation of the left elbow.  Severe arthrosis. 4. No fracture or dislocation of the left wrist. The carpus is normally aligned. Electronically Signed   By: Eddie Candle M.D.   On: 02/28/2021 16:42   CT HEAD WO CONTRAST (5MM)  Result Date: 02/28/2021 CLINICAL DATA:  Fall EXAM: CT HEAD WITHOUT CONTRAST TECHNIQUE: Contiguous axial images were obtained from the base of the skull through the vertex without intravenous contrast. COMPARISON:  CT head 05/05/2019, brain MRI 11/11/2020 FINDINGS: Brain: There is no acute intracranial hemorrhage, extra-axial fluid collection, or acute infarct. The ventricles are normal in size. There is no mass lesion. There is no midline shift. Vascular: There is calcification of the bilateral cavernous ICAs. Skull: There is a large left frontal scalp hematoma without underlying calvarial fracture. Sinuses/Orbits: The paranasal sinuses are clear. The globes and orbits are unremarkable. Other: None. IMPRESSION: 1. No acute intracranial hemorrhage. 2. Large left frontal scalp hematoma without underlying calvarial fracture. Electronically Signed   By: Valetta Mole M.D.   On: 02/28/2021 17:33   CT Cervical Spine Wo Contrast  Result Date: 02/28/2021 CLINICAL DATA:  Fall, midline tenderness EXAM: CT CERVICAL SPINE WITHOUT CONTRAST TECHNIQUE:  Multidetector CT imaging of the cervical spine was performed without intravenous contrast. Multiplanar CT image reconstructions were also generated. COMPARISON:  Cervical spine MRI 11/11/2020, CT cervical spine 05/05/2019 FINDINGS: Alignment: There is straightening of the normal cervical spine lordosis. There is no antero or retrolisthesis. There is no evidence of traumatic malalignment. There is no jumped or perched facet. Skull base and vertebrae: Skull base alignment is maintained. Vertebral body heights are preserved. There is no evidence of acute fracture. The patient is status post C5 through C7 ACDF with well incorporated interbody grafts. Hardware alignment is within expected limits, without evidence of hardware related complication. Soft tissues and spinal canal: No prevertebral fluid or swelling. No visible canal hematoma. There are prominent bilateral cervical chain lymph nodes measuring up to 0.8 cm in short axis bilaterally, unchanged since 2020. Disc levels: There is degenerative change the craniocervical junction, overall similar to the study from 2020. There is no significant narrowing of the craniocervical junction. There is bulky degenerative endplate change at K9-Z7, also similar to the study from 2020.  There is multilevel facet arthropathy, most advanced at C4-C5. Upper chest: The imaged portions of the lung apices are clear. Other: The thyroid is mildly enlarged and heterogeneous, unchanged since 2020. There is calcified atherosclerotic plaque of the bilateral carotid bulbs. IMPRESSION: 1. No acute fracture or traumatic malalignment of the cervical spine. 2. Status post C5 through C7 ACDF without evidence of complication. 3. Multilevel degenerative changes as above. Electronically Signed   By: Valetta Mole M.D.   On: 02/28/2021 17:41   CT Lumbar Spine Wo Contrast  Result Date: 02/28/2021 CLINICAL DATA:  Fall, low back pain EXAM: CT LUMBAR SPINE WITHOUT CONTRAST TECHNIQUE: Multidetector CT  imaging of the lumbar spine was performed without intravenous contrast administration. Multiplanar CT image reconstructions were also generated. COMPARISON:  Lumbar spine MRI 08/12/2020 FINDINGS: Segmentation: Standard; the lowest disc space is designated L5-S1. Alignment: Alignment is normal, with no evidence of traumatic malalignment. There is no antero or retrolisthesis. There is no jumped or perched facet. Vertebrae: The patient is status post L4-L5 TLIF with bilateral laminectomies at L4-L5. Hardware alignment is within expected limits, without evidence of hardware related complication. There is no perihardware lucency. Vertebral body heights are preserved, without evidence of acute fracture. There is no suspicious osseous lesion. Paraspinal and other soft tissues: The paraspinal soft tissues are unremarkable. There are a few colonic diverticula without evidence of acute diverticulitis. Disc levels: There is multilevel intervertebral disc space narrowing with vacuum disc phenomenon. There is associated multilevel degenerative endplate change and advanced facet arthropathy. The osseous spinal canal is patent. There is moderate left neural foraminal stenosis at L4-L5. IMPRESSION: 1. No evidence of acute fracture or traumatic malalignment in the lumbar spine. 2. Status post L4-L5 TLIF without evidence of complication. 3. Multilevel degenerative changes as above, better assessed on the lumbar spine MRI of 08/12/2020. Electronically Signed   By: Valetta Mole M.D.   On: 02/28/2021 17:24   DG Shoulder Left  Result Date: 02/28/2021 CLINICAL DATA:  Fall, pain EXAM: LEFT ELBOW - COMPLETE 3+ VIEW; LEFT HUMERUS - 2+ VIEW; LEFT WRIST - COMPLETE 3+ VIEW; LEFT SHOULDER - 2+ VIEW COMPARISON:  None. FINDINGS: There is anterior dislocation of the left glenohumeral joint, with displaced fractures of the greater tuberosity and surgical neck of the left humerus. Mild acromioclavicular and glenohumeral arthrosis. No fracture or  dislocation of the left elbow. No elbow joint effusion. Severe elbow joint arthrosis. No fracture or dislocation of the left wrist. The carpus is normally aligned. Soft tissues are unremarkable. IMPRESSION: 1. Anterior dislocation of the left glenohumeral joint. 2. Displaced fractures of the greater tuberosity and surgical neck of the left humerus. Consider CT to further evaluate fracture anatomy. 3. No fracture or dislocation of the left elbow.  Severe arthrosis. 4. No fracture or dislocation of the left wrist. The carpus is normally aligned. Electronically Signed   By: Eddie Candle M.D.   On: 02/28/2021 16:42   DG Shoulder Left Portable  Result Date: 02/28/2021 CLINICAL DATA:  Status post reduction EXAM: LEFT SHOULDER COMPARISON:  February 28, 2021 FINDINGS: Status post LEFT shoulder reduction. There is improved alignment of the LEFT humeral head with in the glenoid. However the humeral head is comminuted. There is persistent superior and lateral displacement of the greater tuberosity. Fracture extends through the neck. Joint effusion. Status post ACDF. Degenerative changes of the acromioclavicular joint. IMPRESSION: There is a comminuted fracture of the humeral head with persistent superior and lateral displacement of the greater tuberosity. There is improved  alignment status post reduction. Electronically Signed   By: Valentino Saxon M.D.   On: 02/28/2021 19:51   DG Humerus Left  Result Date: 02/28/2021 CLINICAL DATA:  Fall, pain EXAM: LEFT ELBOW - COMPLETE 3+ VIEW; LEFT HUMERUS - 2+ VIEW; LEFT WRIST - COMPLETE 3+ VIEW; LEFT SHOULDER - 2+ VIEW COMPARISON:  None. FINDINGS: There is anterior dislocation of the left glenohumeral joint, with displaced fractures of the greater tuberosity and surgical neck of the left humerus. Mild acromioclavicular and glenohumeral arthrosis. No fracture or dislocation of the left elbow. No elbow joint effusion. Severe elbow joint arthrosis. No fracture or dislocation of  the left wrist. The carpus is normally aligned. Soft tissues are unremarkable. IMPRESSION: 1. Anterior dislocation of the left glenohumeral joint. 2. Displaced fractures of the greater tuberosity and surgical neck of the left humerus. Consider CT to further evaluate fracture anatomy. 3. No fracture or dislocation of the left elbow.  Severe arthrosis. 4. No fracture or dislocation of the left wrist. The carpus is normally aligned. Electronically Signed   By: Eddie Candle M.D.   On: 02/28/2021 16:42    Procedures Injection of joint  Date/Time: 03/02/2021 12:44 PM Performed by: Tedd Sias, PA Authorized by: Tedd Sias, PA  Consent: Verbal consent obtained. Risks and benefits: risks, benefits and alternatives were discussed Consent given by: patient Patient understanding: patient states understanding of the procedure being performed Patient consent: the patient's understanding of the procedure matches consent given Relevant documents: relevant documents present and verified Test results: test results available and properly labeled Imaging studies: imaging studies available Patient identity confirmed: verbally with patient and arm band Preparation: Patient was prepped and draped in the usual sterile fashion. Local anesthesia used: no  Anesthesia: Local anesthesia used: no  Sedation: Patient sedated: no  Patient tolerance: patient tolerated the procedure well with no immediate complications Comments: Left shoulder posterior approach lidocaine 20cc placed in shoulder successfully with significant improvement in pain.    Reduction of dislocation  Date/Time: 03/02/2021 12:45 PM Performed by: Tedd Sias, PA Authorized by: Tedd Sias, PA  Consent: Verbal consent obtained. Risks and benefits: risks, benefits and alternatives were discussed Consent given by: patient Patient understanding: patient states understanding of the procedure being performed Patient consent:  the patient's understanding of the procedure matches consent given Relevant documents: relevant documents present and verified Test results: test results available and properly labeled Imaging studies: imaging studies available Patient identity confirmed: verbally with patient and arm band Local anesthesia used: no  Anesthesia: Local anesthesia used: no  Sedation: Patient sedated: no  Patient tolerance: patient tolerated the procedure well with no immediate complications Comments: Left shoulder reduced w FARES technique Caryl Pina RN provided counter traction. Fentanyl used for pain and lidocaine injection.  Pt tolerated procedure well.      Medications Ordered in ED Medications  oxyCODONE-acetaminophen (PERCOCET/ROXICET) 5-325 MG per tablet 1 tablet (1 tablet Oral Given 02/28/21 1654)  acetaminophen (TYLENOL) tablet 650 mg (650 mg Oral Given 02/28/21 1655)  lidocaine (PF) (XYLOCAINE) 1 % injection 20 mL (20 mLs Other Given by Other 02/28/21 1808)  fentaNYL (SUBLIMAZE) injection 100 mcg (100 mcg Intravenous Given 02/28/21 1830)    ED Course  I have reviewed the triage vital signs and the nursing notes.  Pertinent labs & imaging results that were available during my care of the patient were reviewed by me and considered in my medical decision making (see chart for details).  Clinical Course as of 03/02/21 1241  Mon Feb 28, 2021  1734 X-ray of left wrist, left humerus, left shoulder, left elbow reviewed I agree w radiology read.  IMPRESSION: 1. Anterior dislocation of the left glenohumeral joint. 2. Displaced fractures of the greater tuberosity and surgical neck of the left humerus. Consider CT to further evaluate fracture anatomy. 3. No fracture or dislocation of the left elbow.  Severe arthrosis. 4. No fracture or dislocation of the left wrist. The carpus is normally aligned.   [WF]  7867 CT Lumbar spine reviewed-unremarkable no acute changes.   IMPRESSION: 1. No evidence of  acute fracture or traumatic malalignment in the lumbar spine. 2. Status post L4-L5 TLIF without evidence of complication. 3. Multilevel degenerative changes as above, better assessed on the lumbar spine MRI of 08/12/2020. [WF]  5449 CT head consistent with exam. Large hematoma but no skull fracture.  IMPRESSION: 1. No acute intracranial hemorrhage. 2. Large left frontal scalp hematoma without underlying calvarial fracture.   [WF]    Clinical Course User Index [WF] Tedd Sias, [place]   MDM Rules/Calculators/A&P                           Pt here w mechanical fall  Exam notable for left shoulder TTP w inability to range shoulder Concern for fx/dislocation  Xrays and ct scans personally reviewed.  Agree with rad read. Anterior shoulder dislocation.   Reduction done after lidocaine injection and fentanyl.   Pt pain much improved. Post-reduction xray reviewed shows interval reduction and better alignment of fracture.   Placed in sling immobilizer. Will Dc home with analgesia and ortho FU.  Return precautions given.   Final Clinical Impression(s) / ED Diagnoses Final diagnoses:  Anterior dislocation of left shoulder, initial encounter  Fall, initial encounter  Other closed displaced fracture of proximal end of left humerus, initial encounter    Rx / DC Orders ED Discharge Orders          Ordered    HYDROcodone-acetaminophen (NORCO/VICODIN) 5-325 MG tablet  Every 4 hours PRN        02/28/21 [place], Syaire Saber [place], [place] 03/02/21 1246    Dorie Rank, MD 03/03/21 [phone]

## 2021-02-28 NOTE — ED Triage Notes (Signed)
Pt to ED via EMS from home c/o fall, No LOC, not on blood thinners. Mechanical fall, pt is a a left bka, prosthesis became loose causing patient to loose balance and fall. Pt did hit head, hematoma to left forehead. Pt c/o pain to left arm, no obvious deformity noted by EMS. Last VS: 126/70, HR 80, RR18, 100%ra. HX HTN, DM

## 2021-02-28 NOTE — Progress Notes (Signed)
Chronic Care Management Pharmacy Assistant   Name: Olivia Romero  MRN: [id number] DOB: February 18, 1956  Chart Review for Clinical Pharmacist on 03/02/2021.   Conditions to be addressed/monitored: HTN, HLD, DMII, and PAD, Asthma,GERD, Bilateral Carpal Tunnel Syndrome,Lumbar stenosis with neurogenic claudication ,B12 deficiency,Vitamin D deficiency, Chronic low back pain,Anemia  Primary concerns for visit include: None   Recent office visits:  02/04/2021 Chrystal Land LCSW (CCM ) No Medication Change Noted 01/21/2021 Delsa Grana PA-C [facility] Referral to [place] Medication Changes Noted, Return in about 6 months  12/22/2020 Rory Percy DO (PCP Office)Afrin (generic is Oxymetazoline) twice daily for 3 days 10/21/2020 Kathrine Haddock NP (PCP Office)No Medication Changes Noted,Return in about 3 months   Recent consult visits:  02/22/2021 Trudie Buckler DPM (Podiatry) No Medication Changes noted 02/15/2021 Dr. Manuella Ghazi MD (Neurology) No Medication Changes Noted 12/27/2020 Dr.Solum MD (Endocrinology) No Medication Changes noted , Recheck in 4 months  11/10/2020 Gayland Curry PA (Neurology)No Medication Changes Noted, Return in about 3 months  09/28/2020  Celesta Gentile (Podiatry) No medication Changes noted, Return in about 3 months   Hospital visits:  None in previous 6 months  Left Voice message to do initial question prior to patient appointment on 03/02/2021 for CCM at 9:00 am with Junius Argyle the Clinical pharmacist.    Medications: Outpatient Encounter Medications as of 02/28/2021  Medication Sig Note   acetaminophen (TYLENOL) 500 MG tablet Take 1,000 mg by mouth every 6 (six) hours as needed for mild pain. Maximum of 3,000 mg per day 10/21/2020: prn   albuterol (VENTOLIN HFA) 108 (90 Base) MCG/ACT inhaler INHALE 2 PUFFS INTO THE LUNGS EVERY 4 HOURS AS NEEDED    allopurinol (ZYLOPRIM) 100 MG tablet Take 1 tablet (100 mg total) by mouth 2 (two) times daily.     aspirin EC 325 MG tablet Take 1 tablet (325 mg total) by mouth daily.    Blood Glucose Monitoring Suppl (CONTOUR NEXT MONITOR) w/Device KIT as directed.    cetirizine (ZYRTEC ALLERGY) 10 MG tablet Take 1 tablet (10 mg total) by mouth daily.    Cholecalciferol 25 MCG (1000 UT) tablet Take by mouth.    Colchicine 0.6 MG CAPS Take 0.6 mg by mouth 2 (two) times daily as needed. 10/21/2020: prn   DULoxetine (CYMBALTA) 60 MG capsule Take 120 mg by mouth daily.    ezetimibe (ZETIA) 10 MG tablet Take 1 tablet (10 mg total) by mouth daily.    fenofibrate (TRICOR) 145 MG tablet TAKE 1 TABLET BY MOUTH ONCE DAILY    gabapentin (NEURONTIN) 100 MG capsule Take by mouth. 08/24/2020: Taking 2 QHS   gabapentin (NEURONTIN) 400 MG capsule Take 400 mg by mouth 4 (four) times daily. 08/24/2020: Taking 2 BID   glimepiride (AMARYL) 2 MG tablet Take 2 mg by mouth 2 (two) times daily.     hydrochlorothiazide (MICROZIDE) 12.5 MG capsule Take 1 capsule (12.5 mg total) by mouth daily.    Incontinence Supply Disposable (ASSURANCE FITTED BRIEF LARGE) MISC Use 1 brief up to 6 times a day as needed for urinary incontinence    insulin degludec (TRESIBA) 200 UNIT/ML FlexTouch Pen Inject 20 Units into the skin at bedtime.     metFORMIN (GLUCOPHAGE-XR) 500 MG 24 hr tablet Take 500 mg by mouth 3 (three) times daily.     pantoprazole (PROTONIX) 40 MG tablet Take 1 tablet (40 mg total) by mouth daily as needed (Heartburn).    tiZANidine (ZANAFLEX) 4 MG tablet TAKE ONE HALF(2MG) TO  ONE AND A HALF TABLETS (6MG) BY MOUTH EVERY 8 HOURS AS NEEDED FOR MUSCLE SPASMS/TIGHTNESS 10/21/2020: prn   triamcinolone (KENALOG) 0.1 % Apply 1 application topically 2 (two) times daily.    ULTICARE SHORT PEN NEEDLES 31G X 8 MM MISC     No facility-administered encounter medications on file as of 02/28/2021.    Care Gaps: Covid-19 Vaccine Star Rating Drugs: Metformin 500 mg last filled 05/18/2020 90 day supply at [facility]. Glimepiride 2 mg  last filled 09/07/2020 90 day supply at [facility].  Medication Fill Gaps Allopurinol 100 MG last filled 10/06/2020 30 day supply at [facility].  Duloxetine 60 MG  last filled 09/07/2020 90 day supply at [facility].  Ezetimibe 10 MG Not on fill report Fenofibrate 145 MG last filled 02/23/2020 90 day supply at [facility]. Hydrochlorothiazide 12.5 MG last filled 10/06/2020 90 day supply at [facility].  [place] Pharmacist Assistant [phone]

## 2021-02-28 NOTE — Discharge Instructions (Addendum)
Your humerus is fractured this is the bone that makes of the upper arm.  You also had a dislocated arm.  This is back in place however you will need to follow-up with an orthopedic doctor.  I have given you the information for 1 here in [place] however may follow-up with any orthopedic doctor you would prefer.  Please wear the sling until you are instructed otherwise by the orthopedic doctor.    Please take Tylenol ibuprofen for pain as discussed below I have also prescribed you some additional Norco which is a strong pain medication only to be used for breakthrough pain.  Please use Tylenol or ibuprofen for pain.  You may use 600 mg ibuprofen every 6 hours or 1000 mg of Tylenol every 6 hours.  You may choose to alternate between the 2.  This would be most effective.  Not to exceed 4 g of Tylenol within 24 hours.  Not to exceed 3200 mg ibuprofen 24 hours.

## 2021-02-28 NOTE — ED Notes (Signed)
Pt d/c home per MD order. Discharge summary reviewed with pt, pt verbalizes understanding. Off unit via WC. No s/s of acute distress noted at discharge. Reports discharge ride home.

## 2021-02-28 NOTE — Progress Notes (Signed)
Orthopedic Tech Progress Note Patient Details:  Olivia Romero [birth year] [id number]  Patient ID: Olivia Romero, female   DOB: Feb 07, 1956, 65 y.o.   MRN: [id number] Assisted rn with arm sling Karolee Stamps 02/28/2021, 10:19 PM

## 2021-03-01 ENCOUNTER — Ambulatory Visit (INDEPENDENT_AMBULATORY_CARE_PROVIDER_SITE_OTHER): Payer: Medicare Other | Admitting: Orthopedic Surgery

## 2021-03-01 ENCOUNTER — Encounter: Payer: Self-pay | Admitting: Orthopedic Surgery

## 2021-03-01 DIAGNOSIS — S4292XA Fracture of left shoulder girdle, part unspecified, initial encounter for closed fracture: Secondary | ICD-10-CM | POA: Diagnosis not present

## 2021-03-01 MED ORDER — HYDROCODONE-ACETAMINOPHEN 5-325 MG PO TABS: 1.0000 | ORAL_TABLET | Freq: Four times a day (QID) | ORAL | 0 refills | Status: DC | PRN

## 2021-03-01 NOTE — Progress Notes (Signed)
Office Visit Note   Patient: Olivia Romero           Date of Birth: 03/28/1956           MRN: [id number] Visit Date: 03/01/2021              Requested by: Delsa Grana, PA-C 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  Patient states that yesterday the pen unscrewed from her liner and her leg fell off she then landed on her left shoulder and face.  Patient presented to the emergency room she underwent a closed reduction for dislocation was placed in a sling and is seen today for initial evaluation.  Assessment & Plan: Visit Diagnoses:  1. Shoulder fracture, left, closed, initial encounter     Plan: Patient would like to continue with conservative therapy with a sling recommended using ice on her face with frozen peas she can also use this on the shoulder recommend continue with the sling work on range of motion of the elbow and fingers sleep and rest in a reclining chair or reclining bed to keep her head elevated and her arm hanging straight.  Repeat 2 view radiographs of the left shoulder at follow-up.  Discussed the possibility of a reverse total shoulder with Dr. Sammuel Hines.  Patient states she does not want to consider a large operation at this time.  Follow-Up Instructions: Return in about 2 weeks (around 03/15/2021).   Ortho Exam  Patient is alert, oriented, no adenopathy, well-dressed, normal affect, normal respiratory effort. Examination patient has some bruising around the left thigh with some small bruising on her forehead.  Her hand is neurovascular intact radiograph shows a reduced humeral head with evulsion of the greater tuberosity as well as a humeral neck fracture.  Imaging: DG Elbow Complete Left  Result Date:  02/28/2021 CLINICAL DATA:  Fall, pain EXAM: LEFT ELBOW - COMPLETE 3+ VIEW; LEFT HUMERUS - 2+ VIEW; LEFT WRIST - COMPLETE 3+ VIEW; LEFT SHOULDER - 2+ VIEW COMPARISON:  None. FINDINGS: There is anterior dislocation of the left glenohumeral joint, with displaced fractures of the greater tuberosity and surgical neck of the left humerus. Mild acromioclavicular and glenohumeral arthrosis. No fracture or dislocation of the left elbow. No elbow joint effusion. Severe elbow joint arthrosis. No fracture or dislocation of the left wrist. The carpus is normally aligned. Soft tissues are unremarkable. IMPRESSION: 1. Anterior dislocation of the left glenohumeral joint. 2. Displaced fractures of the greater tuberosity and surgical neck of the left humerus. Consider CT to further evaluate fracture anatomy. 3. No fracture or dislocation of the left elbow.  Severe arthrosis. 4. No fracture or dislocation of the left wrist. The carpus is normally aligned. Electronically Signed   By: Eddie Candle M.D.   On: 02/28/2021 16:42   DG Wrist Complete Left  Result Date: 02/28/2021 CLINICAL DATA:  Fall, pain EXAM: LEFT ELBOW - COMPLETE 3+ VIEW; LEFT HUMERUS - 2+ VIEW; LEFT WRIST - COMPLETE 3+ VIEW; LEFT SHOULDER - 2+ VIEW COMPARISON:  None. FINDINGS: There is anterior dislocation of the left glenohumeral joint, with displaced fractures of the greater tuberosity and surgical neck of the left humerus. Mild acromioclavicular and glenohumeral arthrosis. No fracture or  dislocation of the left elbow. No elbow joint effusion. Severe elbow joint arthrosis. No fracture or dislocation of the left wrist. The carpus is normally aligned. Soft tissues are unremarkable. IMPRESSION: 1. Anterior dislocation of the left glenohumeral joint. 2. Displaced fractures of the greater tuberosity and surgical neck of the left humerus. Consider CT to further evaluate fracture anatomy. 3. No fracture or dislocation of the left elbow.  Severe arthrosis. 4. No fracture  or dislocation of the left wrist. The carpus is normally aligned. Electronically Signed   By: Eddie Candle M.D.   On: 02/28/2021 16:42   CT HEAD WO CONTRAST (5MM)  Result Date: 02/28/2021 CLINICAL DATA:  Fall EXAM: CT HEAD WITHOUT CONTRAST TECHNIQUE: Contiguous axial images were obtained from the base of the skull through the vertex without intravenous contrast. COMPARISON:  CT head 05/05/2019, brain MRI 11/11/2020 FINDINGS: Brain: There is no acute intracranial hemorrhage, extra-axial fluid collection, or acute infarct. The ventricles are normal in size. There is no mass lesion. There is no midline shift. Vascular: There is calcification of the bilateral cavernous ICAs. Skull: There is a large left frontal scalp hematoma without underlying calvarial fracture. Sinuses/Orbits: The paranasal sinuses are clear. The globes and orbits are unremarkable. Other: None. IMPRESSION: 1. No acute intracranial hemorrhage. 2. Large left frontal scalp hematoma without underlying calvarial fracture. Electronically Signed   By: Valetta Mole M.D.   On: 02/28/2021 17:33   CT Cervical Spine Wo Contrast  Result Date: 02/28/2021 CLINICAL DATA:  Fall, midline tenderness EXAM: CT CERVICAL SPINE WITHOUT CONTRAST TECHNIQUE: Multidetector CT imaging of the cervical spine was performed without intravenous contrast. Multiplanar CT image reconstructions were also generated. COMPARISON:  Cervical spine MRI 11/11/2020, CT cervical spine 05/05/2019 FINDINGS: Alignment: There is straightening of the normal cervical spine lordosis. There is no antero or retrolisthesis. There is no evidence of traumatic malalignment. There is no jumped or perched facet. Skull base and vertebrae: Skull base alignment is maintained. Vertebral body heights are preserved. There is no evidence of acute fracture. The patient is status post C5 through C7 ACDF with well incorporated interbody grafts. Hardware alignment is within expected limits, without evidence of  hardware related complication. Soft tissues and spinal canal: No prevertebral fluid or swelling. No visible canal hematoma. There are prominent bilateral cervical chain lymph nodes measuring up to 0.8 cm in short axis bilaterally, unchanged since 2020. Disc levels: There is degenerative change the craniocervical junction, overall similar to the study from 2020. There is no significant narrowing of the craniocervical junction. There is bulky degenerative endplate change at U2-P5, also similar to the study from 2020. There is multilevel facet arthropathy, most advanced at C4-C5. Upper chest: The imaged portions of the lung apices are clear. Other: The thyroid is mildly enlarged and heterogeneous, unchanged since 2020. There is calcified atherosclerotic plaque of the bilateral carotid bulbs. IMPRESSION: 1. No acute fracture or traumatic malalignment of the cervical spine. 2. Status post C5 through C7 ACDF without evidence of complication. 3. Multilevel degenerative changes as above. Electronically Signed   By: Valetta Mole M.D.   On: 02/28/2021 17:41   CT Lumbar Spine Wo Contrast  Result Date: 02/28/2021 CLINICAL DATA:  Fall, low back pain EXAM: CT LUMBAR SPINE WITHOUT CONTRAST TECHNIQUE: Multidetector CT imaging of the lumbar spine was performed without intravenous contrast administration. Multiplanar CT image reconstructions were also generated. COMPARISON:  Lumbar spine MRI 08/12/2020 FINDINGS: Segmentation: Standard; the lowest disc space is designated L5-S1. Alignment: Alignment is normal, with no  evidence of traumatic malalignment. There is no antero or retrolisthesis. There is no jumped or perched facet. Vertebrae: The patient is status post L4-L5 TLIF with bilateral laminectomies at L4-L5. Hardware alignment is within expected limits, without evidence of hardware related complication. There is no perihardware lucency. Vertebral body heights are preserved, without evidence of acute fracture. There is no  suspicious osseous lesion. Paraspinal and other soft tissues: The paraspinal soft tissues are unremarkable. There are a few colonic diverticula without evidence of acute diverticulitis. Disc levels: There is multilevel intervertebral disc space narrowing with vacuum disc phenomenon. There is associated multilevel degenerative endplate change and advanced facet arthropathy. The osseous spinal canal is patent. There is moderate left neural foraminal stenosis at L4-L5. IMPRESSION: 1. No evidence of acute fracture or traumatic malalignment in the lumbar spine. 2. Status post L4-L5 TLIF without evidence of complication. 3. Multilevel degenerative changes as above, better assessed on the lumbar spine MRI of 08/12/2020. Electronically Signed   By: Valetta Mole M.D.   On: 02/28/2021 17:24   DG Shoulder Left  Result Date: 02/28/2021 CLINICAL DATA:  Fall, pain EXAM: LEFT ELBOW - COMPLETE 3+ VIEW; LEFT HUMERUS - 2+ VIEW; LEFT WRIST - COMPLETE 3+ VIEW; LEFT SHOULDER - 2+ VIEW COMPARISON:  None. FINDINGS: There is anterior dislocation of the left glenohumeral joint, with displaced fractures of the greater tuberosity and surgical neck of the left humerus. Mild acromioclavicular and glenohumeral arthrosis. No fracture or dislocation of the left elbow. No elbow joint effusion. Severe elbow joint arthrosis. No fracture or dislocation of the left wrist. The carpus is normally aligned. Soft tissues are unremarkable. IMPRESSION: 1. Anterior dislocation of the left glenohumeral joint. 2. Displaced fractures of the greater tuberosity and surgical neck of the left humerus. Consider CT to further evaluate fracture anatomy. 3. No fracture or dislocation of the left elbow.  Severe arthrosis. 4. No fracture or dislocation of the left wrist. The carpus is normally aligned. Electronically Signed   By: Eddie Candle M.D.   On: 02/28/2021 16:42   DG Shoulder Left Portable  Result Date: 02/28/2021 CLINICAL DATA:  Status post reduction EXAM:  LEFT SHOULDER COMPARISON:  February 28, 2021 FINDINGS: Status post LEFT shoulder reduction. There is improved alignment of the LEFT humeral head with in the glenoid. However the humeral head is comminuted. There is persistent superior and lateral displacement of the greater tuberosity. Fracture extends through the neck. Joint effusion. Status post ACDF. Degenerative changes of the acromioclavicular joint. IMPRESSION: There is a comminuted fracture of the humeral head with persistent superior and lateral displacement of the greater tuberosity. There is improved alignment status post reduction. Electronically Signed   By: Valentino Saxon M.D.   On: 02/28/2021 19:51   DG Humerus Left  Result Date: 02/28/2021 CLINICAL DATA:  Fall, pain EXAM: LEFT ELBOW - COMPLETE 3+ VIEW; LEFT HUMERUS - 2+ VIEW; LEFT WRIST - COMPLETE 3+ VIEW; LEFT SHOULDER - 2+ VIEW COMPARISON:  None. FINDINGS: There is anterior dislocation of the left glenohumeral joint, with displaced fractures of the greater tuberosity and surgical neck of the left humerus. Mild acromioclavicular and glenohumeral arthrosis. No fracture or dislocation of the left elbow. No elbow joint effusion. Severe elbow joint arthrosis. No fracture or dislocation of the left wrist. The carpus is normally aligned. Soft tissues are unremarkable. IMPRESSION: 1. Anterior dislocation of the left glenohumeral joint. 2. Displaced fractures of the greater tuberosity and surgical neck of the left humerus. Consider CT to further evaluate fracture anatomy. 3.  No fracture or dislocation of the left elbow.  Severe arthrosis. 4. No fracture or dislocation of the left wrist. The carpus is normally aligned. Electronically Signed   By: Eddie Candle M.D.   On: 02/28/2021 16:42   No images are attached to the encounter.  Labs: Lab Results  Component Value Date   HGBA1C 7.2 (H) 01/21/2021   HGBA1C 7.5 07/01/2020   HGBA1C 7.3 (H) 05/16/2019   ESRSEDRATE 60 (H) 12/05/2018   ESRSEDRATE  41 (H) 12/04/2018   ESRSEDRATE 30 07/12/2018   CRP 22.0 (H) 12/05/2018   CRP 20 (H) 12/04/2018   CRP 6 07/12/2018   LABURIC 5.4 04/22/2020   LABURIC 8.7 (H) 03/25/2020   LABURIC 8.2 (H) 11/30/2015   REPTSTATUS 12/12/2018 FINAL 12/06/2018   GRAMSTAIN  12/06/2018    RARE WBC PRESENT, PREDOMINANTLY PMN NO ORGANISMS SEEN    CULT  12/06/2018    RARE METHICILLIN RESISTANT STAPHYLOCOCCUS AUREUS WITH NORMAL SKIN FLORA NO ANAEROBES ISOLATED Performed at [facility], [place] [address] [place], [place] [address]    LABORGA METHICILLIN RESISTANT STAPHYLOCOCCUS AUREUS 12/06/2018     Lab Results  Component Value Date   ALBUMIN 3.7 12/04/2018   ALBUMIN 4.0 11/27/2018   ALBUMIN 3.8 03/11/2018   PREALBUMIN 21.5 03/12/2018    No results found for: MG Lab Results  Component Value Date   VD25OH 11 (L) 09/20/2016    Lab Results  Component Value Date   PREALBUMIN 21.5 03/12/2018   CBC EXTENDED Latest Ref Rng & Units 01/21/2021 07/12/2020 05/16/2019  WBC 3.8 - 10.8 Thousand/uL 9.1 7.6 8.4  RBC 3.80 - 5.10 Million/uL 4.68 4.40 4.35  HGB 11.7 - 15.5 g/dL 11.6(L) 11.3(L) 11.2(L)  HCT 35.0 - 45.0 % 37.7 35.6 36.1  PLT 140 - 400 Thousand/uL 330 344 310  NEUTROABS 1,500 - 7,800 cells/uL 5,296 4,218 -  LYMPHSABS 850 - 3,900 cells/uL 2,803 2,622 -     There is no height or weight on file to calculate BMI.  Orders:  No orders of the defined types were placed in this encounter.  No orders of the defined types were placed in this encounter.    Procedures: No procedures performed  Clinical Data: No additional findings.  ROS:  All other systems negative, except as noted in the HPI. Review of Systems  Objective: Vital Signs: LMP  (LMP Unknown)   Specialty Comments:  No specialty comments available.  PMFS History: Patient Active Problem List   Diagnosis Date Noted   Anemia 01/21/2021   Urticaria 10/21/2020   Rash and nonspecific skin eruption 07/27/2020   Hepatotoxicity  due to statin drug 07/15/2020   Vitamin D deficiency 07/12/2020   Chronic low back pain 07/12/2020   BMI 36.0-36.9,adult 12/29/2019   Sepsis without acute organ dysfunction ([place]) 12/29/2019   Post-operative state 12/29/2019   Asthma 12/29/2019   Cervical disc disorder 12/29/2019   Displacement of cervical intervertebral disc 12/29/2019   Abnormal posture 12/29/2019   Brachial radiculitis 12/29/2019   Neck pain 12/29/2019   Pain in upper limb 12/29/2019   Whiplash injury to neck 12/29/2019   Allergy to statin medication 12/01/2019   History of below knee amputation, left ([place]) 07/30/2019   Gangrene of left foot ([place]) 05/16/2019   Charcot's joint of foot, left    PAD (peripheral artery disease) ([place]) 12/05/2018   MRSA bacteremia 12/05/2018   Charcot's joint of foot due to diabetes (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/31/2014   Gastroesophageal reflux disease with esophagitis 12/31/2014   Dysphagia 12/31/2014   Bilateral carpal tunnel syndrome 12/01/2014   Diverticulosis of colon 11/30/2012   Benign essential hypertension 11/28/2012   Past Medical History:  Diagnosis Date   Anemia    Arthritis    Asthma    Colon polyps    adenomatous   Diabetes mellitus without complication ([place])    Diabetic infection of left foot ([place]) 12/2018   Diverticulosis of colon    Esophagitis    GERD (gastroesophageal reflux disease)     Hemorrhoid    internal   Hyperlipemia    Hypertension    IBS (irritable bowel syndrome)    no current prob - diet controlled   Myalgia due to statin 11/19/2017   Neuropathy    Neuropathy of both feet    Neuropathy of hand    Pneumonia    x 4   PONV (postoperative nausea and vomiting)    on some surgeries but not all procedures   Skin ulcer of right ankle, limited to breakdown of skin ([place]) 12/31/2016   resolved per patient 05/14/19   Sleep apnea    has had in the past lost 50 pounds and do longer uses cpap   Statin intolerance 04/23/2015   Stroke ([place]) 2009   mini stroke per patient   Stroke-like episode 2009   TIA - mini stroke per patient   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 ([place]) 07/13/2015   Uncontrolled type 2 diabetes mellitus with hyperglycemia ([place]) 11/01/2017    Family History  Problem Relation Age of Onset   Lung cancer Father    Hypertension Father    Arthritis Father    Other Mother        hardening of the arteries/renal cell carcenoma   Hypertension Mother    Stroke Mother    Kidney cancer Mother    Arthritis Brother    Rheum arthritis Maternal Uncle    Bladder Cancer Neg Hx     Past Surgical History:  Procedure Laterality Date   ABDOMINAL HYSTERECTOMY     AMPUTATION Left 12/06/2018   Procedure: PARTIAL AMPUTATION LEFT FOOT;  Surgeon: Edrick Kins, DPM;  Location: [facility];  Service: Podiatry;  Laterality: Left;   AMPUTATION Left 05/16/2019   Procedure: LEFT BELOW KNEE AMPUTATION;  Surgeon: Newt Minion, MD;  Location: [facility];  Service: Orthopedics;  Laterality: Left;   BACK SURGERY  09/02/2015   BONE BIOPSY Left 12/06/2018   Procedure: Bone Biopsy;  Surgeon: Edrick Kins, DPM;  Location: [facility];  Service: Podiatry;  Laterality: Left;   [place]   COLONOSCOPY N/A 11/30/2012   Procedure: COLONOSCOPY;  Surgeon: Irene Shipper, MD;  Location: [facility] ENDOSCOPY;  Service: Endoscopy;  Laterality: N/A;   COLONOSCOPY  WITH PROPOFOL N/A 01/03/2016   Procedure: COLONOSCOPY WITH PROPOFOL;  Surgeon: Manya Silvas, MD;  Location[facility] END[facility]COPY;  Service: Endoscopy;  Laterality: N/A;   ESOPHAGOGASTRODUODENOSCOPY (  EGD) WITH PROPOFOL N/A 01/14/2015   Procedure: ESOPHAGOGASTRODUODENOSCOPY (EGD) WITH PROPOFOL;  Surgeon: Manya Silvas, MD;  Location: [facility] ENDOSCOPY;  Service: Endoscopy;  Laterality: N/A;   IRRIGATION AND DEBRIDEMENT FOOT Left 03/13/2018   Procedure: IRRIGATION AND DEBRIDEMENT FOOT WITH BONE BIOPSY WITH MISONIX DEBRIDER;  Surgeon: Evelina Bucy, DPM;  Location: [place];  Service: Podiatry;  Laterality: Left;   IRRIGATION AND DEBRIDEMENT FOOT Left 12/06/2018   Procedure: Irrigation And Debridement Foot;  Surgeon: Edrick Kins, DPM;  Location: [place];  Service: Podiatry;  Laterality: Left;   LEG AMPUTATION BELOW KNEE Left    LUMBAR WOUND DEBRIDEMENT N/A 10/01/2015   Procedure: LUMBAR WOUND DEBRIDEMENT;  Surgeon: Ashok Pall, MD;  Location: [place] NEURO ORS;  Service: Neurosurgery;  Laterality: N/A;  LUMBAR WOUND DEBRIDEMENT   NASAL SINUS SURGERY     SAVORY DILATION N/A 01/14/2015   Procedure: SAVORY DILATION;  Surgeon: Manya Silvas, MD;  Location: [facility] ENDOSCOPY;  Service: Endoscopy;  Laterality: N/A;   TONSILLECTOMY     WISDOM TOOTH EXTRACTION     Social History   Occupational History   Occupation: disabled  Tobacco Use   Smoking status: Never   Smokeless tobacco: Never  Vaping Use   Vaping Use: Never used  Substance and Sexual Activity   Alcohol use: Never   Drug use: No   Sexual activity: Not Currently    Comment: Hysterectomy

## 2021-03-02 ENCOUNTER — Telehealth: Payer: Medicare Other

## 2021-03-02 ENCOUNTER — Telehealth: Payer: Self-pay

## 2021-03-02 NOTE — Progress Notes (Deleted)
Chronic Care Management Pharmacy Note  03/02/2021 Name:  Olivia Romero MRN:  [id number] DOB:  1955/07/02  Summary: ***  Recommendations/Changes made from today's visit: ***  Plan: ***   Subjective: Olivia Romero is an 65 y.o. year old female who is a primary patient of Delsa Grana, [place].  The CCM team was consulted for assistance with disease management and care coordination needs.    Engaged with patient by telephone for initial visit in response to provider referral for pharmacy case management and/or care coordination services.   Consent to Services:  The patient was given the following information about Chronic Care Management services today, agreed to services, and gave verbal consent: 1. CCM service includes personalized support from designated clinical staff supervised by the primary care provider, including individualized plan of care and coordination with other care providers 2. 24/7 contact phone numbers for assistance for urgent and routine care needs. 3. Service will only be billed when office clinical staff spend 20 minutes or more in a month to coordinate care. 4. Only one practitioner may furnish and bill the service in a calendar month. 5.The patient may stop CCM services at any time (effective at the end of the month) by phone call to the office staff. 6. The patient will be responsible for cost sharing (co-pay) of up to 20% of the service fee (after annual deductible is met). Patient agreed to services and consent obtained.  Patient Care Team: Delsa Grana, PA-C as PCP - General (Family Medicine) Gabriel Carina, Betsey Holiday, MD as Physician Assistant (Endocrinology) Vladimir Crofts, MD as Consulting Physician (Neurology) Trula Slade, DPM as Consulting Physician (Podiatry) Newt Minion, MD as Consulting Physician (Orthopedic Surgery) Germaine Pomfret, [facility] (Pharmacist) Vern Claude, [place] as Social Worker  Recent office visits: 02/04/2021 Chrystal Land LCSW  (CCM ) No Medication Change Noted 01/21/2021 Delsa Grana PA-C [facility] Referral to [place] Medication Changes Noted, Return in about 6 months  12/22/2020 Rory Percy DO (PCP Office)Afrin (generic is Oxymetazoline) twice daily for 3 days 10/21/2020 Kathrine Haddock NP (PCP Office)No Medication Changes Noted,Return in about 3 months   Recent consult visits: 02/22/2021 Trudie Buckler DPM (Podiatry) No Medication Changes noted 02/15/2021 Dr. Manuella Ghazi MD (Neurology) No Medication Changes Noted 12/27/2020 Dr.Solum MD (Endocrinology) No Medication Changes noted , Recheck in 4 months  11/10/2020 Gayland Curry PA (Neurology)No Medication Changes Noted, Return in about 3 months  09/28/2020  Celesta Gentile (Podiatry) No medication Changes noted, Return in about 3 months   Hospital visits: 02/28/21: Patient presented to ED for shoulder dislocation after fall.    Objective:  Lab Results  Component Value Date   CREATININE 0.75 01/21/2021   BUN 18 01/21/2021   GFRNONAA 100 07/12/2020   GFRAA 116 07/12/2020   NA 140 01/21/2021   K 4.9 01/21/2021   CALCIUM 9.5 01/21/2021   CO2 28 01/21/2021   GLUCOSE 94 01/21/2021    Lab Results  Component Value Date/Time   HGBA1C 7.2 (H) 01/21/2021 10:42 AM   HGBA1C 7.5 07/01/2020 12:00 AM   HGBA1C 7.3 (H) 05/16/2019 06:35 AM   HGBA1C 7.9 09/17/2018 12:00 AM   FRUCTOSAMINE 277 02/22/2015 10:16 AM   MICROALBUR 0.9 01/21/2021 10:42 AM   MICROALBUR 34.7 10/23/2017 12:00 AM   MICROALBUR NEG 05/15/2016 09:45 AM    Last diabetic Eye exam:  Lab Results  Component Value Date/Time   HMDIABEYEEXA Retinopathy (A) 07/29/2020 12:00 AM    Last diabetic Foot exam: No results found  for: HMDIABFOOTEX   Lab Results  Component Value Date   CHOL 194 01/21/2021   HDL 44 (L) 01/21/2021   LDLCALC 114 (H) 01/21/2021   TRIG 255 (H) 01/21/2021   CHOLHDL 4.4 01/21/2021    Hepatic Function Latest Ref Rng & Units 01/21/2021 07/12/2020 12/04/2018  Total  Protein 6.1 - 8.1 g/dL 7.1 6.9 7.8  Albumin 3.5 - 5.0 g/dL - - 3.7  AST 10 - 35 U/L 14 14 20   ALT 6 - 29 U/L 13 18 19   Alk Phosphatase 38 - 126 U/L - - 68  Total Bilirubin 0.2 - 1.2 mg/dL 0.3 0.2 0.3    Lab Results  Component Value Date/Time   TSH 0.70 01/31/2017 03:37 PM   TSH 0.70 09/20/2016 11:41 AM    CBC Latest Ref Rng & Units 01/21/2021 07/12/2020 05/16/2019  WBC 3.8 - 10.8 Thousand/uL 9.1 7.6 8.4  Hemoglobin 11.7 - 15.5 g/dL 11.6(L) 11.3(L) 11.2(L)  Hematocrit 35.0 - 45.0 % 37.7 35.6 36.1  Platelets 140 - 400 Thousand/uL 330 344 310    Lab Results  Component Value Date/Time   VD25OH 11 (L) 09/20/2016 11:41 AM    Clinical ASCVD: {YES/NO:21197} The 10-year ASCVD risk score (Arnett DK, et al., 2019) is: 16.4%   Values used to calculate the score:     Age: 32 years     Sex: Female     Is Non-Hispanic African American: No     Diabetic: Yes     Tobacco smoker: No     Systolic Blood Pressure: 015 mmHg     Is BP treated: Yes     HDL Cholesterol: 44 mg/dL     Total Cholesterol: 194 mg/dL    Depression screen [facility] 2/9 02/04/2021 01/21/2021 01/21/2021  Decreased Interest 0 1 1  Down, Depressed, Hopeless 0 1 1  PHQ - 2 Score 0 2 2  Altered sleeping - 0 -  Tired, decreased energy - 2 -  Change in appetite - 0 -  Feeling bad or failure about yourself  - 0 -  Trouble concentrating - 0 -  Moving slowly or fidgety/restless - 0 -  Suicidal thoughts - 0 -  PHQ-9 Score - 4 -  Difficult doing work/chores - Not difficult at all -  Some recent data might be hidden     ***Other: (CHADS2VASc if Afib, MMRC or CAT for COPD, ACT, DEXA)  Social History   Tobacco Use  Smoking Status Never  Smokeless Tobacco Never   BP Readings from Last 3 Encounters:  02/28/21 (!) 148/75  01/21/21 122/82  10/21/20 128/72   Pulse Readings from Last 3 Encounters:  02/28/21 95  01/21/21 77  10/21/20 88   Wt Readings from Last 3 Encounters:  02/28/21 181 lb (82.1 kg)  01/21/21 180 lb 12.8 oz  (82 kg)  12/22/20 185 lb (83.9 kg)   BMI Readings from Last 3 Encounters:  02/28/21 35.35 kg/m  01/21/21 33.07 kg/m  12/22/20 33.84 kg/m    Assessment/Interventions: Review of patient past medical history, allergies, medications, health status, including review of consultants reports, laboratory and other test data, was performed as part of comprehensive evaluation and provision of chronic care management services.   SDOH:  (Social Determinants of Health) assessments and interventions performed: {yes/no:20286}  SDOH Screenings   Alcohol Screen: Low Risk    Last Alcohol Screening Score (AUDIT): 0  Depression (PHQ2-9): Low Risk    PHQ-2 Score: 0  Financial Resource Strain: Low Risk    Difficulty  of Paying Living Expenses: Not very hard  Food Insecurity: No Food Insecurity   Worried About [place] in the Last Year: Never true   Ran Out of Food in the Last Year: Never true  Housing: Low Risk    Last Housing Risk Score: 0  Physical Activity: Insufficiently Active   Days of Exercise per Week: 7 days   Minutes of Exercise per Session: 20 min  Social Connections: Unknown   Frequency of Communication with Friends and Family: More than three times a week   Frequency of Social Gatherings with Friends and Family: Once a week   Attends Religious Services: 1 to 4 times per year   Active Member of [employer] or Organizations: Yes   Attends Music therapist: More than 4 times per year   Marital Status: Not on file  Stress: No Stress Concern Present   Feeling of Stress : Not at all  Tobacco Use: Low Risk    Smoking Tobacco Use: Never   Smokeless Tobacco Use: Never  Transportation Needs: No Transportation Needs   Lack of Transportation (Medical): No   Lack of Transportation (Non-Medical): No    CCM Care Plan  Allergies  Allergen Reactions   Eggs Or Egg-Derived Products Swelling and Other (See Comments)    Angioedema   Atorvastatin Other (See Comments)    Liver  toxicity   Influenza Virus Vaccine Swelling    Arm swelled (site of injection)   Latex Itching   Pravastatin Itching and Rash   Tape Rash and Other (See Comments)    Adhesive on foot pull off skin.  Ok to use paper tape.    Medications Reviewed Today     Reviewed by Newt Minion, MD (Physician) on 03/01/21 at 1439  Med List Status: <None>   Medication Order Taking? Sig Documenting Provider Last Dose Status Informant  acetaminophen (TYLENOL) 500 MG tablet [id number] No Take 1,000 mg by mouth every 6 (six) hours as needed for mild pain. Maximum of 3,000 mg per day Arnetha Courser, MD Past Week Active Self           Med Note (JEFFRIES, Talbert Cage Oct 21, 2020  9:39 AM) prn  albuterol (VENTOLIN HFA) 108 (90 Base) MCG/ACT inhaler [id number] No INHALE 2 PUFFS INTO THE LUNGS EVERY 4 HOURS AS NEEDED Delsa Grana, PA-C Past Month Active   allopurinol (ZYLOPRIM) 100 MG tablet [id number] No Take 1 tablet (100 mg total) by mouth 2 (two) times daily. Newt Minion, MD 02/27/2021 Active   aspirin EC 325 MG tablet [id number] No Take 1 tablet (325 mg total) by mouth daily. Steele Sizer, MD 02/27/2021 2230 Active Self  Blood Glucose Monitoring Suppl (CONTOUR NEXT MONITOR) w/Device KIT [id number] No as directed. [provider] Taking Active   cetirizine (ZYRTEC ALLERGY) 10 MG tablet [id number] No Take 1 tablet (10 mg total) by mouth daily.  Patient not taking: Reported on 02/28/2021   Kathrine Haddock, NP Not Taking Active   Colchicine 0.6 MG CAPS [id number] No Take 0.6 mg by mouth 2 (two) times daily as needed.  Patient not taking: Reported on 02/28/2021   Newt Minion, MD Not Taking Active            Med Note (JEFFRIES, Talbert Cage Oct 21, 2020  9:39 AM) prn  DULoxetine (CYMBALTA) 60 MG capsule [id number] No Take 120 mg by mouth daily. [provider] Taking Active   ezetimibe (ZETIA)  10 MG tablet [id number]  Take 1 tablet (10 mg total) by mouth daily. Delsa Grana, PA-C  Active    fenofibrate (TRICOR) 145 MG tablet [id number] No TAKE 1 TABLET BY MOUTH ONCE DAILY Delsa Grana, PA-C Taking Active   gabapentin (NEURONTIN) 100 MG capsule [id number] No Take by mouth. [provider] Taking Active            Med Note Gloris Ham, [place] [employer] D   Tue Aug 24, 2020  8:55 AM) Taking 2 QHS  gabapentin (NEURONTIN) 400 MG capsule [id number] No Take 400 mg by mouth 4 (four) times daily. [provider] Taking Active Self           Med Note Gloris Ham, [place] [employer] D   Tue Aug 24, 2020  8:55 AM) Taking 2 BID  glimepiride (AMARYL) 2 MG tablet [id number] No Take 2 mg by mouth 2 (two) times daily.  [provider] Taking Active Self           Med Note Corbin Ade   Thu Dec 14, 2016  6:52 PM)    hydrochlorothiazide (MICROZIDE) 12.5 MG capsule [id number]  Take 1 capsule (12.5 mg total) by mouth daily. Delsa Grana, PA-C  Active   HYDROcodone-acetaminophen (NORCO/VICODIN) 5-325 MG tablet [id number]  Take 2 tablets by mouth every 4 (four) hours as needed. Tedd Sias, [place]  Active   HYDROcodone-acetaminophen (NORCO/VICODIN) 5-325 MG tablet [id number] Yes Take 1-2 tablets by mouth every 6 (six) hours as needed for moderate pain. Newt Minion, MD  Active   Incontinence Supply Disposable (ASSURANCE FITTED BRIEF LARGE) MISC [id number] No Use 1 brief up to 6 times a day as needed for urinary incontinence Delsa Grana, PA-C Taking Active   insulin degludec (TRESIBA) 200 UNIT/ML FlexTouch Pen [id number] No Inject 20 Units into the skin at bedtime.  [provider] Taking Active Self           Med Note ([place] Dec 04, 2018  8:23 PM)    metFORMIN (GLUCOPHAGE-XR) 500 MG 24 hr tablet [id number] No Take 500 mg by mouth 3 (three) times daily.  [provider] Taking Active Self  pantoprazole (PROTONIX) 40 MG tablet [id number]  Take 1 tablet (40 mg total) by mouth daily as needed (Heartburn). Delsa Grana, PA-C  Active   tiZANidine (ZANAFLEX) 4 MG tablet [id number] No  TAKE ONE HALF(2MG) TO ONE AND A HALF TABLETS (6MG) BY MOUTH EVERY 8 HOURS AS NEEDED FOR MUSCLE SPASMS/TIGHTNESS Delsa Grana, PA-C Taking Active            Med Note (JEFFRIES, Talbert Cage Oct 21, 2020  9:40 AM) prn  triamcinolone (KENALOG) 0.1 % [id number] No Apply 1 application topically 2 (two) times daily. Pearson Forster, NP Taking Active   ULTICARE SHORT PEN NEEDLES 31G X 8 MM MISC [id number] No  [provider] Taking Active   Med List Note Laurell Roof 01/24/21 1438): zetia             Patient Active Problem List   Diagnosis Date Noted   Anemia 01/21/2021   Urticaria 10/21/2020   Rash and nonspecific skin eruption 07/27/2020   Hepatotoxicity due to statin drug 07/15/2020   Vitamin D deficiency 07/12/2020   Chronic low back pain 07/12/2020   BMI 36.0-36.9,adult 12/29/2019   Sepsis without acute organ dysfunction ([place]) 12/29/2019   Post-operative state 12/29/2019   Asthma 12/29/2019   Cervical disc disorder 12/29/2019   Displacement of cervical  intervertebral disc 12/29/2019   Abnormal posture 12/29/2019   Brachial radiculitis 12/29/2019   Neck pain 12/29/2019   Pain in upper limb 12/29/2019   Whiplash injury to neck 12/29/2019   Allergy to statin medication 12/01/2019   History of below knee amputation, left ([place]) 07/30/2019   Gangrene of left foot ([place]) 05/16/2019   Charcot's joint of foot, left    PAD (peripheral artery disease) ([place]) 12/05/2018   MRSA bacteremia 12/05/2018   Charcot's joint of foot due to diabetes ([place]) 09/18/2018   Class 2 severe obesity with serious comorbidity and body mass index (BMI) of 37.0 to 37.9 in adult ([place]) 11/19/2017   B12 deficiency 11/01/2017   Diabetic polyneuropathy associated with type 2 diabetes mellitus ([place]) 12/13/2015   Lumbar stenosis with neurogenic claudication 09/02/2015   Chronic constipation 07/13/2015   OSA (obstructive sleep apnea) 06/25/2015   Primary osteoarthritis involving multiple joints  04/23/2015   Hyperlipidemia 04/23/2015   Statin intolerance 04/23/2015   Asthma, mild intermittent 02/01/2015   Type 2 diabetes mellitus with diabetic nephropathy ([place]) 12/31/2014   Gastroesophageal reflux disease with esophagitis 12/31/2014   Dysphagia 12/31/2014   Bilateral carpal tunnel syndrome 12/01/2014   Diverticulosis of colon 11/30/2012   Benign essential hypertension 11/28/2012    Immunization History  Administered Date(s) Administered   Influenza-Unspecified 06/24/2015   PNEUMOCOCCAL CONJUGATE-20 01/21/2021   Pneumococcal Polysaccharide-23 01/20/2014   Tdap 07/13/2015    Conditions to be addressed/monitored:  Hypertension, Hyperlipidemia, Diabetes, GERD, Asthma, and Gout  There are no care plans that you recently modified to display for this patient.    Medication Assistance: {MEDASSISTANCEINFO:25044}  Compliance/Adherence/Medication fill history: Care Gaps: ***  Star-Rating Drugs: ***  Patient's preferred pharmacy is:  [facility], [place] [address] [place] [address] Phone: [phone] Fax: [phone]  Uses pill box? {Yes or If no, why not?:20788} Pt endorses ***% compliance  We discussed: {Pharmacy options:24294} Patient decided to: {US Pharmacy Plan:23885}  Care Plan and Follow Up Patient Decision:  {FOLLOWUP:24991}  Plan: {CM FOLLOW UP FYBO:17510}  ***  Current Barriers:  {pharmacybarriers:24917}  Pharmacist Clinical Goal(s):  Patient will {PHARMACYGOALCHOICES:24921} through collaboration with PharmD and provider.   Interventions: 1:1 collaboration with Delsa Grana, PA-C regarding development and update of comprehensive plan of care as evidenced by provider attestation and co-signature Inter-disciplinary care team collaboration (see longitudinal plan of care) Comprehensive medication review performed; medication list updated in electronic medical record  Hypertension (BP goal {CHL HP  UPSTREAM Pharmacist BP [phone]}) -{US controlled/uncontrolled:25276} -Current treatment: HCTZ 12.5 mg daily -Medications previously tried: ***  -Current home readings: *** -Current dietary habits: *** -Current exercise habits: *** -{ACTIONS;DENIES/REPORTS:21021675::"Denies"} hypotensive/hypertensive symptoms -Educated on {CCM BP Counseling:25124} -Counseled to monitor BP at home ***, document, and provide log at future appointments -{CCMPHARMDINTERVENTION:25122}  Hyperlipidemia: (LDL goal < ***) -{US controlled/uncontrolled:25276} -Current treatment: Ezetimibe 10 mg daily  Fenofibrate 145 mg daily -Current treatment: Aspirin 325 mg daily  -Medications previously tried: ***  -Current dietary patterns: *** -Current exercise habits: *** -Educated on {CCM HLD Counseling:25126} -{CCMPHARMDINTERVENTION:25122}  Diabetes (A1c goal {A1c goals:23924}) -{US controlled/uncontrolled:25276} -Current medications: Glimepiride 2 mg twice daily  Tresiba 20 units nightly  Metformin XR 500 mg three times daily  -Medications previously tried: ***  -Current home glucose readings fasting glucose: *** post prandial glucose: *** -{ACTIONS;DENIES/REPORTS:21021675::"Denies"} hypoglycemic/hyperglycemic symptoms -Current meal patterns:  breakfast: ***  lunch: ***  dinner: *** snacks: *** drinks: *** -Current exercise: *** -Educated on {CCM DM COUNSELING:25123} -Counseled to check feet  daily and get yearly eye exams -{CCMPHARMDINTERVENTION:25122}  *** (Goal: ***) -{US controlled/uncontrolled:25276} -Current treatment  Duloxetine 60 mg 2 caps daily  Gabapentin 100 mg  Gabapentin 400 mg four times daily -Medications previously tried: ***  -{CCMPHARMDINTERVENTION:25122}   Asthma (Goal: control symptoms and prevent exacerbations) -{US controlled/uncontrolled:25276} -Current treatment  Ventolin 2 puff every 4 hours as needed  Cetirizine 10 mg daily  -Medications previously  tried: ***  -Gold Grade: {CHL HP Upstream Pharm COPD Gold NOIBB:[id number]} -Current COPD Classification:  {CHL HP Upstream Pharm COPD Classification:[phone]} -MMRC/CAT score: *** -Pulmonary function testing: *** -Exacerbations requiring treatment in last 6 months: *** -Patient {Actions; denies-reports:120008} consistent use of maintenance inhaler -Frequency of rescue inhaler use: *** -Counseled on {CCMINHALERCOUNSELING:25121} -{CCMPHARMDINTERVENTION:25122}  Gout (Goal: Prevent gout flares) -{US controlled/uncontrolled:25276} -Last Gout Flare: *** -Current treatment  Allopurinol 100 mg twice daily  Colchicine 0.6 mg twice daily as needed  -Medications previously tried: ***  -We discussed:  {gout counseling:24106} -{CCMPHARMDINTERVENTION:25122}  *** (Goal: ***) -{US controlled/uncontrolled:25276} -Current treatment  Pantoprazole 40 mg daily as needed  -Medications previously tried: ***  -{CCMPHARMDINTERVENTION:25122}    Patient Goals/Self-Care Activities Patient will:  - {pharmacypatientgoals:24919}  Follow Up Plan: {CM FOLLOW UP HWTU:88280}

## 2021-03-02 NOTE — Telephone Encounter (Signed)
Pts daughter in law called regarding pts apt yesterday.   She just wanted to know if someone needs to stay with the patient for the next two weeks before she comes back  Her call back # [phone] Ronny Bacon is her name

## 2021-03-02 NOTE — Telephone Encounter (Signed)
I called and lm on vm advised that if the pt feels like she is able to care for herself and does not require assistance that is total up to her. If she would feel better having someone at the home and needs assistance with daily care and activities then someone should stay with her. To call with any other questions.

## 2021-03-03 ENCOUNTER — Ambulatory Visit: Payer: Medicare Other | Admitting: Orthopedic Surgery

## 2021-03-03 ENCOUNTER — Telehealth: Payer: Self-pay

## 2021-03-03 NOTE — Telephone Encounter (Signed)
Copied from [place] [phone]. Topic: General - Other >> Mar 03, 2021  3:30 PM Leward Quan A wrote: Reason for CRM: Patient called in to inform the pharmacist that she missed her appointment due to the medication she took that had her very sleepy. Patient broke her arm and was on pain medication that made her very sleepy. Please call to reschedule can be reached at Ph#   [phone]

## 2021-03-07 ENCOUNTER — Telehealth: Payer: Self-pay | Admitting: *Deleted

## 2021-03-07 ENCOUNTER — Telehealth: Payer: Medicare Other | Admitting: *Deleted

## 2021-03-07 NOTE — Telephone Encounter (Signed)
  Care Management   Follow Up Note   03/07/2021 Name: Olivia Romero MRN: [id number] DOB: Dec 29, 1955   Referred by: Delsa Grana, PA-C Reason for referral : Care Coordination   An unsuccessful telephone outreach was attempted today. The patient was referred to the case management team for assistance with care management and care coordination.   Follow Up Plan: Telephone follow up appointment with care management team member scheduled for: 03/11/21   [name], [place] Worker  [facility] Care Management [phone]

## 2021-03-10 ENCOUNTER — Telehealth: Payer: Self-pay | Admitting: Orthopedic Surgery

## 2021-03-10 NOTE — Telephone Encounter (Signed)
Can you please call? Appt tomorrow with Olivia Romero is she sustained injury needs eval first. Thanks!

## 2021-03-10 NOTE — Telephone Encounter (Signed)
Pt called stating she fell and has been in a lot of pain. Pt would like to know if she could get one more refill on her pain rx? Pt would like a CB with a answer please.   [phone]

## 2021-03-11 ENCOUNTER — Encounter: Payer: Self-pay | Admitting: Family

## 2021-03-11 ENCOUNTER — Ambulatory Visit: Payer: Self-pay

## 2021-03-11 ENCOUNTER — Ambulatory Visit: Payer: Medicare Other | Admitting: *Deleted

## 2021-03-11 ENCOUNTER — Ambulatory Visit (INDEPENDENT_AMBULATORY_CARE_PROVIDER_SITE_OTHER): Payer: Medicare Other | Admitting: Family

## 2021-03-11 DIAGNOSIS — S4292XA Fracture of left shoulder girdle, part unspecified, initial encounter for closed fracture: Secondary | ICD-10-CM | POA: Diagnosis not present

## 2021-03-11 DIAGNOSIS — I10 Essential (primary) hypertension: Secondary | ICD-10-CM

## 2021-03-11 DIAGNOSIS — E1121 Type 2 diabetes mellitus with diabetic nephropathy: Secondary | ICD-10-CM

## 2021-03-11 DIAGNOSIS — E785 Hyperlipidemia, unspecified: Secondary | ICD-10-CM

## 2021-03-11 DIAGNOSIS — Z599 Problem related to housing and economic circumstances, unspecified: Secondary | ICD-10-CM

## 2021-03-11 MED ORDER — HYDROCODONE-ACETAMINOPHEN 5-325 MG PO TABS: 1.0000 | ORAL_TABLET | Freq: Four times a day (QID) | ORAL | 0 refills | Status: DC | PRN

## 2021-03-11 NOTE — Patient Instructions (Signed)
Visit Information   Goals Addressed             This Visit's Progress    Manage My Emotions       Timeframe:  Long-Range Goal Priority:  Medium Start Date:         02/04/21                    Expected End Date:  06/06/20                     Follow Up Date 03/15/21    - start or continue a personal journal - talk about feelings with a friend, family or spiritual advisor - practice positive thinking and self-talk    Why is this important?   When you are stressed, down or upset, your body reacts too.  For example, your blood pressure may get higher; you may have a headache or stomachache.  When your emotions get the best of you, your body's ability to fight off cold and flu gets weak.  These steps will help you manage your emotions.     Notes:         The patient verbalized understanding of instructions, educational materials, and care plan provided today and declined offer to receive copy of patient instructions, educational materials, and care plan.   Telephone follow up appointment with care management team member scheduled for:03/15/21  [name], [place] Worker  [facility] Care Management [phone]

## 2021-03-11 NOTE — Chronic Care Management (AMB) (Signed)
Chronic Care Management    Clinical Social Work Note  03/11/2021 Name: Olivia Romero MRN: [id number] DOB: April 09, 1956  Olivia Romero is a 65 y.o. year old female who is a primary care patient of Delsa Grana, [place]. The CCM team was consulted to assist the patient with chronic disease management and/or care coordination needs related to: Mental Health Counseling and Resources.   Engaged with patient by telephone for follow up visit in response to provider referral for social work chronic care management and care coordination services.   Patient was not able to complete appointment today-appointment was re-scheduled for 03/15/21 1:30 pm.  Consent to Services:  The patient was given information about Chronic Care Management services, agreed to services, and gave verbal consent prior to initiation of services.  Please see initial visit note for detailed documentation.   Patient agreed to services and consent obtained.   Assessment: Review of patient past medical history, allergies, medications, and health status, including review of relevant consultants reports was performed today as part of a comprehensive evaluation and provision of chronic care management and care coordination services.     SDOH (Social Determinants of Health) assessments and interventions performed:    Advanced Directives Status: Not addressed in this encounter.  CCM Care Plan  Allergies  Allergen Reactions   Eggs Or Egg-Derived Products Swelling and Other (See Comments)    Angioedema   Atorvastatin Other (See Comments)    Liver toxicity   Influenza Virus Vaccine Swelling    Arm swelled (site of injection)   Latex Itching   Pravastatin Itching and Rash   Tape Rash and Other (See Comments)    Adhesive on foot pull off skin.  Ok to use paper tape.    Outpatient Encounter Medications as of 03/11/2021  Medication Sig Note   acetaminophen (TYLENOL) 500 MG tablet Take 1,000 mg by mouth every 6 (six) hours as  needed for mild pain. Maximum of 3,000 mg per day 10/21/2020: prn   albuterol (VENTOLIN HFA) 108 (90 Base) MCG/ACT inhaler INHALE 2 PUFFS INTO THE LUNGS EVERY 4 HOURS AS NEEDED    allopurinol (ZYLOPRIM) 100 MG tablet Take 1 tablet (100 mg total) by mouth 2 (two) times daily.    aspirin EC 325 MG tablet Take 1 tablet (325 mg total) by mouth daily.    Blood Glucose Monitoring Suppl (CONTOUR NEXT MONITOR) w/Device KIT as directed.    cetirizine (ZYRTEC ALLERGY) 10 MG tablet Take 1 tablet (10 mg total) by mouth daily. (Patient not taking: Reported on 02/28/2021)    Colchicine 0.6 MG CAPS Take 0.6 mg by mouth 2 (two) times daily as needed. (Patient not taking: Reported on 02/28/2021) 10/21/2020: prn   DULoxetine (CYMBALTA) 60 MG capsule Take 120 mg by mouth daily.    ezetimibe (ZETIA) 10 MG tablet Take 1 tablet (10 mg total) by mouth daily.    fenofibrate (TRICOR) 145 MG tablet TAKE 1 TABLET BY MOUTH ONCE DAILY    gabapentin (NEURONTIN) 100 MG capsule Take by mouth. 08/24/2020: Taking 2 QHS   gabapentin (NEURONTIN) 400 MG capsule Take 400 mg by mouth 4 (four) times daily. 08/24/2020: Taking 2 BID   glimepiride (AMARYL) 2 MG tablet Take 2 mg by mouth 2 (two) times daily.     hydrochlorothiazide (MICROZIDE) 12.5 MG capsule Take 1 capsule (12.5 mg total) by mouth daily.    HYDROcodone-acetaminophen (NORCO/VICODIN) 5-325 MG tablet Take 2 tablets by mouth every 4 (four) hours as needed.    HYDROcodone-acetaminophen (NORCO/VICODIN)  5-325 MG tablet Take 1 tablet by mouth every 6 (six) hours as needed for moderate pain.    Incontinence Supply Disposable (ASSURANCE FITTED BRIEF LARGE) MISC Use 1 brief up to 6 times a day as needed for urinary incontinence    insulin degludec (TRESIBA) 200 UNIT/ML FlexTouch Pen Inject 20 Units into the skin at bedtime.     metFORMIN (GLUCOPHAGE-XR) 500 MG 24 hr tablet Take 500 mg by mouth 3 (three) times daily.     pantoprazole (PROTONIX) 40 MG tablet Take 1 tablet (40 mg total) by  mouth daily as needed (Heartburn).    tiZANidine (ZANAFLEX) 4 MG tablet TAKE ONE HALF(2MG) TO ONE AND A HALF TABLETS (6MG) BY MOUTH EVERY 8 HOURS AS NEEDED FOR MUSCLE SPASMS/TIGHTNESS 10/21/2020: prn   triamcinolone (KENALOG) 0.1 % Apply 1 application topically 2 (two) times daily.    ULTICARE SHORT PEN NEEDLES 31G X 8 MM MISC     No facility-administered encounter medications on file as of 03/11/2021.    Patient Active Problem List   Diagnosis Date Noted   Anemia 01/21/2021   Urticaria 10/21/2020   Rash and nonspecific skin eruption 07/27/2020   Hepatotoxicity due to statin drug 07/15/2020   Vitamin D deficiency 07/12/2020   Chronic low back pain 07/12/2020   BMI 36.0-36.9,adult 12/29/2019   Sepsis without acute organ dysfunction ([place]) 12/29/2019   Post-operative state 12/29/2019   Asthma 12/29/2019   Cervical disc disorder 12/29/2019   Displacement of cervical intervertebral disc 12/29/2019   Abnormal posture 12/29/2019   Brachial radiculitis 12/29/2019   Neck pain 12/29/2019   Pain in upper limb 12/29/2019   Whiplash injury to neck 12/29/2019   Allergy to statin medication 12/01/2019   History of below knee amputation, left ([place]) 07/30/2019   Gangrene of left foot ([place]) 05/16/2019   Charcot's joint of foot, left    PAD (peripheral artery disease) ([place]) 12/05/2018   MRSA bacteremia 12/05/2018   Charcot's joint of foot due to diabetes ([place]) 09/18/2018   Class 2 severe obesity with serious comorbidity and body mass index (BMI) of 37.0 to 37.9 in adult ([place]) 11/19/2017   B12 deficiency 11/01/2017   Diabetic polyneuropathy associated with type 2 diabetes mellitus ([place]) 12/13/2015   Lumbar stenosis with neurogenic claudication 09/02/2015   Chronic constipation 07/13/2015   OSA (obstructive sleep apnea) 06/25/2015   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/23/2015   Hyperlipidemia 04/23/2015   Statin intolerance 04/23/2015   Asthma, mild intermittent 02/01/2015   Type  2 diabetes mellitus with diabetic nephropathy ([place]) 12/31/2014   Gastroesophageal reflux disease with esophagitis 12/31/2014   Dysphagia 12/31/2014   Bilateral carpal tunnel syndrome 12/01/2014   Diverticulosis of colon 11/30/2012   Benign essential hypertension 11/28/2012    Conditions to be addressed/monitored: Depression; Mental Health Concerns   Care Plan : Depression (Adult)  Updates made by Vern Claude, LCSW since 03/11/2021 12:00 AM     Problem: Symptoms (Depression)      Goal: Symptoms Monitored and Managed   Start Date: 02/04/2021  This Visit's Progress: On track  Priority: Medium  Note:   Current Barriers:  Acute Mental Health needs related to current medical condition ADL IADL limitations Suicidal Ideation/Homicidal Ideation: No  Clinical Social Work Goal(s):  Over the next 90 days, patient will work with SW monthly by telephone or in person to reduce or manage symptoms related to depressed mood patient will follow up with this Education officer, museum to assess status of depressed mood  Interventions: Patient  interviewed and appropriate assessments performed: PHQ 2 Patient discussed experiencing symptoms of depression related to her current medical condition-patient discussed being down during her last provider visit due to struggles with the loss of her leg and the inability to drive however is doing much better today Patient states that she utilizes [facility] to her medical appointments and the church [employer] transports her to church Patient confirms use of crutches and has a wheelchair ramp Patient discussed feeling alone, however is now able to identify a positive support network of family and church members who are willing and able to provide assistance Patient confirmed having a strong faith and has changed her perspective regarding her condition Patient able to identify positive coping strategies including journaling and positive self talk Verbalization of  feelings encouraged Active listening / Reflection utilized ,  Emotional Support Provided,  Positive coping strategies and self care reinforced 03/11/21 follow up phone call to patient to check status of depressed mood-however during follow up call-patient requested that the call be re-scheduled-follow up appointment scheduled for 03/15/21 at 1:30 pm Patient Self Care Activities:  Self administers medications as prescribed Attends all scheduled provider appointments Calls pharmacy for medication refills Attends church or other social activities Performs ADL's independently Performs IADL's independently Ability for insight Independent living Strong family or social support  Patient Coping Strengths:  Supportive Relationships Family [place] to Communicate Effectively  Initial goal documentation       Follow Up Plan: Appointment scheduled for SW follow up with client by phone on: 03/15/21 1:30pm  [place], [place] Worker  [facility] Care Management [phone]

## 2021-03-11 NOTE — Progress Notes (Signed)
Office Visit Note   Patient: Olivia Romero           Date of Birth: January 28, 1956           MRN: [id number] Visit Date: 03/11/2021              Requested by: Delsa Grana, PA-C [address] [place],  [place] [address] PCP: Delsa Grana, PA-C  Chief Complaint  Patient presents with   Right Hip - Injury    S/p fall at home on right hip // pt states that she is not in any pain       HPI: The patient is a 65 year old woman who is seen for concern of pain to her right hip as well as reinjuring her left arm.  She is status post proximal humerus fracture as well as a remote left below-knee amputation she had a fall and landed on her right hip as to protect her left upper extremity.  She feels she has not had any worsening of pain to her left upper arm no change in symptoms there she has her arm in a sling and has been nonweightbearing with that she does work on range of motion of her hand and wrist.  Her right hip she states is a little bit sore but she is not concerned for fracture or injury to this hip.  She has been able to stand and pivot as her baseline  Assessment & Plan: Visit Diagnoses:  1. Shoulder fracture, left, closed, initial encounter     Plan: Repeat radiographs of the left shoulder concerning for further collapse of the proximal humerus.  Reviewed films with Dr. Marlou Sa.  The patient will follow-up with him in the office in 10 days she will continue her sling continue nonweightbearing  Follow-Up Instructions: No follow-ups on file.   Right Hip Exam  Right hip exam is normal.    Left Shoulder Exam   Other  Sensation: normal Pulse: present   Comments:  No deformity     Patient is alert, oriented, no adenopathy, well-dressed, normal affect, normal respiratory effort.   Imaging: No results found. No images are attached to the encounter.  Labs: Lab Results  Component Value Date   HGBA1C 7.2 (H) 01/21/2021   HGBA1C 7.5 07/01/2020   HGBA1C  7.3 (H) 05/16/2019   ESRSEDRATE 60 (H) 12/05/2018   ESRSEDRATE 41 (H) 12/04/2018   ESRSEDRATE 30 07/12/2018   CRP 22.0 (H) 12/05/2018   CRP 20 (H) 12/04/2018   CRP 6 07/12/2018   LABURIC 5.4 04/22/2020   LABURIC 8.7 (H) 03/25/2020   LABURIC 8.2 (H) 11/30/2015   REPTSTATUS 12/12/2018 FINAL 12/06/2018   GRAMSTAIN  12/06/2018    RARE WBC PRESENT, PREDOMINANTLY PMN NO ORGANISMS SEEN    CULT  12/06/2018    RARE METHICILLIN RESISTANT STAPHYLOCOCCUS AUREUS WITH NORMAL SKIN FLORA NO ANAEROBES ISOLATED Performed at [facility], [place] [address] [place], [place] [address]    LABORGA METHICILLIN RESISTANT STAPHYLOCOCCUS AUREUS 12/06/2018     Lab Results  Component Value Date   ALBUMIN 3.7 12/04/2018   ALBUMIN 4.0 11/27/2018   ALBUMIN 3.8 03/11/2018   PREALBUMIN 21.5 03/12/2018    No results found for: MG Lab Results  Component Value Date   VD25OH 11 (L) 09/20/2016    Lab Results  Component Value Date   PREALBUMIN 21.5 03/12/2018   CBC EXTENDED Latest Ref Rng & Units 01/21/2021 07/12/2020 05/16/2019  WBC 3.8 - 10.8 Thousand/uL 9.1 7.6  8.4  RBC 3.80 - 5.10 Million/uL 4.68 4.40 4.35  HGB 11.7 - 15.5 g/dL 11.6(L) 11.3(L) 11.2(L)  HCT 35.0 - 45.0 % 37.7 35.6 36.1  PLT 140 - 400 Thousand/uL 330 344 310  NEUTROABS 1,500 - 7,800 cells/uL 5,296 4,218 -  LYMPHSABS 850 - 3,900 cells/uL 2,803 2,622 -     There is no height or weight on file to calculate BMI.  Orders:  Orders Placed This Encounter  Procedures   XR Shoulder Left   No orders of the defined types were placed in this encounter.    Procedures: No procedures performed  Clinical Data: No additional findings.  ROS:  All other systems negative, except as noted in the HPI. Review of Systems  Objective: Vital Signs: LMP  (LMP Unknown)   Specialty Comments:  No specialty comments available.  PMFS History: Patient Active Problem List   Diagnosis Date Noted   Anemia 01/21/2021   Urticaria 10/21/2020    Rash and nonspecific skin eruption 07/27/2020   Hepatotoxicity due to statin drug 07/15/2020   Vitamin D deficiency 07/12/2020   Chronic low back pain 07/12/2020   BMI 36.0-36.9,adult 12/29/2019   Sepsis without acute organ dysfunction ([place]) 12/29/2019   Post-operative state 12/29/2019   Asthma 12/29/2019   Cervical disc disorder 12/29/2019   Displacement of cervical intervertebral disc 12/29/2019   Abnormal posture 12/29/2019   Brachial radiculitis 12/29/2019   Neck pain 12/29/2019   Pain in upper limb 12/29/2019   Whiplash injury to neck 12/29/2019   Allergy to statin medication 12/01/2019   History of below knee amputation, left ([place]) 07/30/2019   Gangrene of left foot ([place]) 05/16/2019   Charcot's joint of foot, left    PAD (peripheral artery disease) ([place]) 12/05/2018   MRSA bacteremia 12/05/2018   Charcot's joint of foot due to diabetes ([place]) 09/18/2018   Class 2 severe obesity with serious comorbidity and body mass index (BMI) of 37.0 to 37.9 in adult ([place]) 11/19/2017   B12 deficiency 11/01/2017   Diabetic polyneuropathy associated with type 2 diabetes mellitus ([place]) 12/13/2015   Lumbar stenosis with neurogenic claudication 09/02/2015   Chronic constipation 07/13/2015   OSA (obstructive sleep apnea) 06/25/2015   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/23/2015   Hyperlipidemia 04/23/2015   Statin intolerance 04/23/2015   Asthma, mild intermittent 02/01/2015   Type 2 diabetes mellitus with diabetic nephropathy ([place]) 12/31/2014   Gastroesophageal reflux disease with esophagitis 12/31/2014   Dysphagia 12/31/2014   Bilateral carpal tunnel syndrome 12/01/2014   Diverticulosis of colon 11/30/2012   Benign essential hypertension 11/28/2012   Past Medical History:  Diagnosis Date   Anemia    Arthritis    Asthma    Colon polyps    adenomatous   Diabetes mellitus without complication ([place])    Diabetic infection of left foot ([place]) 12/2018   Diverticulosis of  colon    Esophagitis    GERD (gastroesophageal reflux disease)    Hemorrhoid    internal   Hyperlipemia    Hypertension    IBS (irritable bowel syndrome)    no current prob - diet controlled   Myalgia due to statin 11/19/2017   Neuropathy    Neuropathy of both feet    Neuropathy of hand    Pneumonia    x 4   PONV (postoperative nausea and vomiting)    on some surgeries but not all procedures   Skin ulcer of right ankle, limited to breakdown of skin ([place]) 12/31/2016   resolved per  patient 05/14/19   Sleep apnea    has had in the past lost 50 pounds and do longer uses cpap   Statin intolerance 04/23/2015   Stroke [facility]) 2009   mini stroke per patient   Stroke-like episode 2009   TIA - mini stroke per patient   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 07/13/2015   Uncontrolled type 2 diabetes mellitus with hyperglycemia ([place]) 11/01/2017    Family History  Problem Relation Age of Onset   Lung cancer Father    Hypertension Father    Arthritis Father    Other Mother        hardening of the arteries/renal cell carcenoma   Hypertension Mother    Stroke Mother    Kidney cancer Mother    Arthritis Brother    Rheum arthritis Maternal Uncle    Bladder Cancer Neg Hx     Past Surgical History:  Procedure Laterality Date   ABDOMINAL HYSTERECTOMY     AMPUTATION Left 12/06/2018   Procedure: PARTIAL AMPUTATION LEFT FOOT;  Surgeon: Edrick Kins, DPM;  Location: [facility];  Service: Podiatry;  Laterality: Left;   AMPUTATION Left 05/16/2019   Procedure: LEFT BELOW KNEE AMPUTATION;  Surgeon: Newt Minion, MD;  Location: [facility];  Service: Orthopedics;  Laterality: Left;   BACK SURGERY  09/02/2015   BONE BIOPSY Left 12/06/2018   Procedure: Bone Biopsy;  Surgeon: Edrick Kins, DPM;  Location: [facility];  Service: Podiatry;  Laterality: Left;   [place]   COLONOSCOPY N/A 11/30/2012   Procedure: COLONOSCOPY;  Surgeon: Irene Shipper, MD;  Location: [facility]  ENDOSCOPY;  Service: Endoscopy;  Laterality: N/A;   COLONOSCOPY WITH PROPOFOL N/A 01/03/2016   Procedure: COLONOSCOPY WITH PROPOFOL;  Surgeon: Manya Silvas, MD;  Location: [facility] ENDOSCOPY;  Service: Endoscopy;  Laterality: N/A;   ESOPHAGOGASTRODUODENOSCOPY (EGD) WITH PROPOFOL N/A 01/14/2015   Procedure: ESOPHAGOGASTRODUODENOSCOPY (EGD) WITH PROPOFOL;  Surgeon: Manya Silvas, MD;  Location[facility] ENDOSCOPY;  Service: Endoscopy;  Laterality: N/A;   IRRIGATION AND DEBRIDEMENT FOOT Left 03/13/2018   Procedure: IRRIGATION AND DEBRIDEMENT FOOT WITH BONE BIOPSY WITH MISONIX DEBRIDER;  Surgeon: Evelina Bucy, DPM;  Location: [facility];  Service: Podiatry;  Laterality: Left;   IRRIGATION AND DEBRIDEMENT FOOT Left 12/06/2018   Procedure: Irrigation And Debridement Foot;  Surgeon: Edrick Kins, DPM;  Location: [facility];  Service: Podiatry;  Laterality: Left;   LEG AMPUTATION BELOW KNEE Left    LUMBAR WOUND DEBRIDEMENT N/A 10/01/2015   Procedure: LUMBAR WOUND DEBRIDEMENT;  Surgeon: Ashok Pall, MD;  Location: [facility] NEURO ORS;  Service: Neurosurgery;  Laterality: N/A;  LUMBAR WOUND DEBRIDEMENT   NASAL SINUS SURGERY     SAVORY DILATION N/A 01/14/2015   Procedure: SAVORY DILATION;  Surgeon: Manya Silvas, MD;  Location: [facility] ENDOSCOPY;  Service: Endoscopy;  Laterality: N/A;   TONSILLECTOMY     WISDOM TOOTH EXTRACTION     Social History   Occupational History   Occupation: disabled  Tobacco Use   Smoking status: Never   Smokeless tobacco: Never  Vaping Use   Vaping Use: Never used  Substance and Sexual Activity   Alcohol use: Never   Drug use: No   Sexual activity: Not Currently    Comment: Hysterectomy

## 2021-03-15 ENCOUNTER — Telehealth: Payer: Self-pay | Admitting: *Deleted

## 2021-03-15 ENCOUNTER — Telehealth: Payer: Medicare Other | Admitting: *Deleted

## 2021-03-15 ENCOUNTER — Ambulatory Visit: Payer: Medicare Other | Admitting: Orthopedic Surgery

## 2021-03-15 NOTE — Telephone Encounter (Signed)
   03/15/2021  Olivia Romero 10/29/1955 [id number]   Care Management   Follow Up Note   03/15/2021 Name: Olivia Romero MRN: [id number] DOB: [birth year]   Referred by: Delsa Grana, PA-C Reason for referral : Care Coordination   A second unsuccessful telephone outreach was attempted today. The patient was referred to the case management team for assistance with care management and care coordination.   Follow Up Plan: Telephone follow up appointment with care management team member scheduled for: 03/21/21  [name], [place] Worker  [facility] Care Management [phone]

## 2021-03-21 ENCOUNTER — Ambulatory Visit (INDEPENDENT_AMBULATORY_CARE_PROVIDER_SITE_OTHER): Payer: Medicare Other | Admitting: Orthopedic Surgery

## 2021-03-21 ENCOUNTER — Ambulatory Visit (INDEPENDENT_AMBULATORY_CARE_PROVIDER_SITE_OTHER): Payer: Medicare Other | Admitting: *Deleted

## 2021-03-21 ENCOUNTER — Ambulatory Visit: Payer: Self-pay

## 2021-03-21 ENCOUNTER — Encounter: Payer: Self-pay | Admitting: Orthopedic Surgery

## 2021-03-21 DIAGNOSIS — M25522 Pain in left elbow: Secondary | ICD-10-CM | POA: Diagnosis not present

## 2021-03-21 DIAGNOSIS — E785 Hyperlipidemia, unspecified: Secondary | ICD-10-CM

## 2021-03-21 DIAGNOSIS — M25512 Pain in left shoulder: Secondary | ICD-10-CM | POA: Diagnosis not present

## 2021-03-21 DIAGNOSIS — Z89512 Acquired absence of left leg below knee: Secondary | ICD-10-CM

## 2021-03-21 DIAGNOSIS — I10 Essential (primary) hypertension: Secondary | ICD-10-CM

## 2021-03-21 NOTE — Progress Notes (Signed)
Post-fracture visit Note   Patient: Olivia Romero           Date of Birth: Feb 07, 1956           MRN: [id number] Visit Date: 03/21/2021 PCP: Delsa Grana, PA-C   Assessment & Plan:  Chief Complaint:  Chief Complaint  Patient presents with   Left Shoulder - Follow-up   Visit Diagnoses:  1. Left shoulder pain, unspecified chronicity   2. Pain in left elbow     Plan: Patient is a 65 year old female who presents for evaluation s/p left shoulder proximal humerus fracture with anterior dislocation.  Date of injury was 02/28/2021 and required reduction.  She has seen Dr. Sharol Given and Dondra Prader, NP.  She reports that her left shoulder pain is steadily but slowly improving.  She reports 4/10 pain.  She has been wearing her sling as needed.  Taking hydrocodone and Tylenol for pain control.  She also notes increased pain in the left elbow medially.  Denies any numbness or tingling in the arm aside from one area in the lateral deltoid and mid humerus.  She does have a history of neuropathy primarily in her feet but also in her hands.  No history of left shoulder surgery.  She does have history of left transtibial amputation by Dr. Sharol Given.  Also has history of diabetes with last A1c around 8.0.  Denies any history of CKD, thyroid disease, smoking.  She does have history of low vitamin D but she states that she just finished a multiple weeklong course of 50,000 units of vitamin D once weekly.  On examination, patient does have swelling in the left upper extremity diffusely with no ecchymosis noted.  2+ radial pulse of both upper extremities.  She has excellent strength and function of EPL, finger abduction, wrist extension.  She does struggle with finger adduction and FPL with motor function rated 4/5.  She has tenderness diffusely through the shoulder.  Axillary nerve does not seem to be intact with deltoid struggling to fire.  Additionally, there is lack of pinprick sensation over the lateral deltoid  whereas this is intact over the anterior and posterior deltoid.  She has excellent elbow flexion strength but lacks some strength with elbow extension rated 4/5.  Radiographs taken today demonstrate proximal humerus fracture with no change in alignment since prior radiographs.  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  Discussed the natural history of complex shoulder fracture dislocation with S[place]eria today.  She understands that this will likely impact her shoulder function long-term and she will likely never regain the range of motion that she had prior to the injury.  She is just relieved that it did not affect her dominant arm.  Plan to treat nonoperatively for now.  Main surgical option would be for her would be reverse shoulder arthroplasty but this is not a great option for her at least at this time with axillary nerve not functioning 100%.  Plan for her to continue with sling and okay to come out of the sling for pendulum exercises a couple times per day starting in a week.  Follow-up in 3 weeks for clinical recheck with new radiographs with Dr. Marlou Sa.  Follow-Up Instructions: No follow-ups on file.   Orders:  Orders Placed This Encounter  Procedures   XR Shoulder Left   XR Elbow Complete Left (3+View)   No orders of the defined types were  placed in this encounter.   Imaging: No results found.  PMFS History: Patient Active Problem List   Diagnosis Date Noted   Anemia 01/21/2021   Urticaria 10/21/2020   Rash and nonspecific skin eruption 07/27/2020   Hepatotoxicity due to statin drug 07/15/2020   Vitamin D deficiency 07/12/2020   Chronic low back pain 07/12/2020   BMI 36.0-36.9,adult 12/29/2019   Sepsis without acute organ dysfunction ([place]) 12/29/2019   Post-operative state 12/29/2019   Asthma 12/29/2019   Cervical disc disorder 12/29/2019   Displacement of cervical  intervertebral disc 12/29/2019   Abnormal posture 12/29/2019   Brachial radiculitis 12/29/2019   Neck pain 12/29/2019   Pain in upper limb 12/29/2019   Whiplash injury to neck 12/29/2019   Allergy to statin medication 12/01/2019   History of below knee amputation, left ([place]) 07/30/2019   Gangrene of left foot ([place]) 05/16/2019   Charcot's joint of foot, left    PAD (peripheral artery disease) ([place]) 12/05/2018   MRSA bacteremia 12/05/2018   Charcot's joint of foot due to diabetes ([place]) 09/18/2018   Class 2 severe obesity with serious comorbidity and body mass index (BMI) of 37.0 to 37.9 in adult ([place]) 11/19/2017   B12 deficiency 11/01/2017   Diabetic polyneuropathy associated with type 2 diabetes mellitus ([place]) 12/13/2015   Lumbar stenosis with neurogenic claudication 09/02/2015   Chronic constipation 07/13/2015   OSA (obstructive sleep apnea) 06/25/2015   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/23/2015   Hyperlipidemia 04/23/2015   Statin intolerance 04/23/2015   Asthma, mild intermittent 02/01/2015   Type 2 diabetes mellitus with diabetic nephropathy ([place]) 12/31/2014   Gastroesophageal reflux disease with esophagitis 12/31/2014   Dysphagia 12/31/2014   Bilateral carpal tunnel syndrome 12/01/2014   Diverticulosis of colon 11/30/2012   Benign essential hypertension 11/28/2012   Past Medical History:  Diagnosis Date   Anemia    Arthritis    Asthma    Colon polyps    adenomatous   Diabetes mellitus without complication ([place])    Diabetic infection of left foot ([place]) 12/2018   Diverticulosis of colon    Esophagitis    GERD (gastroesophageal reflux disease)    Hemorrhoid    internal   Hyperlipemia    Hypertension    IBS (irritable bowel syndrome)    no current prob - diet controlled   Myalgia due to statin 11/19/2017   Neuropathy    Neuropathy of both feet    Neuropathy of hand    Pneumonia    x 4   PONV (postoperative nausea and vomiting)    on some  surgeries but not all procedures   Skin ulcer of right ankle, limited to breakdown of skin ([place]) 12/31/2016   resolved per patient 05/14/19   Sleep apnea    has had in the past lost 50 pounds and do longer uses cpap   Statin intolerance 04/23/2015   Stroke ([place]) 2009   mini stroke per patient   Stroke-like episode 2009   TIA - mini stroke per patient   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 07/13/2015   Uncontrolled type 2 diabetes mellitus with hyperglycemia ([place]) 11/01/2017    Family History  Problem Relation Age of Onset   Lung cancer Father    Hypertension Father    Arthritis Father    Other Mother        hardening of the arteries/renal cell carcenoma   Hypertension Mother    Stroke Mother    Kidney cancer Mother  Arthritis Brother    Rheum arthritis Maternal Uncle    Bladder Cancer Neg Hx     Past Surgical History:  Procedure Laterality Date   ABDOMINAL HYSTERECTOMY     AMPUTATION Left 12/06/2018   Procedure: PARTIAL AMPUTATION LEFT FOOT;  Surgeon: Edrick Kins, DPM;  Location: [facility];  Service: Podiatry;  Laterality: Left;   AMPUTATION Left 05/16/2019   Procedure: LEFT BELOW KNEE AMPUTATION;  Surgeon: Newt Minion, MD;  Location: [facility];  Service: Orthopedics;  Laterality: Left;   BACK SURGERY  09/02/2015   BONE BIOPSY Left 12/06/2018   Procedure: Bone Biopsy;  Surgeon: Edrick Kins, DPM;  Location: [facility];  Service: Podiatry;  Laterality: Left;   [place]   COLONOSCOPY N/A 11/30/2012   Procedure: COLONOSCOPY;  Surgeon: Irene Shipper, MD;  Location: [facility];  Service: Endoscopy;  Laterality: N/A;   COLONOSCOPY WITH PROPOFOL N/A 01/03/2016   Procedure: COLONOSCOPY WITH PROPOFOL;  Surgeon: Manya Silvas, MD;  Location: [facility] ENDOSCOPY;  Service: Endoscopy;  Laterality: N/A;   ESOPHAGOGASTRODUODENOSCOPY (EGD) WITH PROPOFOL N/A 01/14/2015   Procedure: ESOPHAGOGASTRODUODENOSCOPY (EGD) WITH PROPOFOL;  Surgeon: Manya Silvas, MD;  Location: [facility] ENDOSCOPY;  Service: Endoscopy;  Laterality: N/A;   IRRIGATION AND DEBRIDEMENT FOOT Left 03/13/2018   Procedure: IRRIGATION AND DEBRIDEMENT FOOT WITH BONE BIOPSY WITH MISONIX DEBRIDER;  Surgeon: Evelina Bucy, DPM;  Location: [facility];  Service: Podiatry;  Laterality: Left;   IRRIGATION AND DEBRIDEMENT FOOT Left 12/06/2018   Procedure: Irrigation And Debridement Foot;  Surgeon: Edrick Kins, DPM;  Location: [facility];  Service: Podiatry;  Laterality: Left;   LEG AMPUTATION BELOW KNEE Left    LUMBAR WOUND DEBRIDEMENT N/A 10/01/2015   Procedure: LUMBAR WOUND DEBRIDEMENT;  Surgeon: Ashok Pall, MD;  Location: [facility];  Service: Neurosurgery;  Laterality: N/A;  LUMBAR WOUND DEBRIDEMENT   NASAL SINUS SURGERY     SAVORY DILATION N/A 01/14/2015   Procedure: SAVORY DILATION;  Surgeon: Manya Silvas, MD;  Location[facility] END[facility];  Service: Endoscopy;  Laterality: N/A;   TONSILLECTOMY     WISDOM TOOTH EXTRACTION     Social History   Occupational History   Occupation: disabled  Tobacco Use   Smoking status: Never   Smokeless tobacco: Never  Vaping Use   Vaping Use: Never used  Substance and Sexual Activity   Alcohol use: Never   Drug use: No   Sexual activity: Not Currently    Comment: Hysterectomy

## 2021-03-21 NOTE — Patient Instructions (Signed)
Visit Information   Goals Addressed             This Visit's Progress    Manage My Emotions       Timeframe:  Long-Range Goal Priority:  Medium Start Date:         02/04/21                    Expected End Date:  03/21/21                  Follow Up Date 03/21/21    - start or continue a personal journal - talk about feelings with a friend, family or spiritual advisor - practice positive thinking and self-talk    Why is this important?   When you are stressed, down or upset, your body reacts too.  For example, your blood pressure may get higher; you may have a headache or stomachache.  When your emotions get the best of you, your body's ability to fight off cold and flu gets weak.  These steps will help you manage your emotions.     Notes:         The patient verbalized understanding of instructions, educational materials, and care plan provided today and declined offer to receive copy of patient instructions, educational materials, and care plan.   No further follow up required: patient to contact this Education officer, museum with any additional community resource needs  [employer], [place] Worker  [facility] Care Management [phone]

## 2021-03-21 NOTE — Chronic Care Management (AMB) (Signed)
Chronic Care Management    Clinical Social Work Note  03/21/2021 Name: Olivia Romero MRN: [id number] DOB: Jun 04, 1956  Olivia Romero is a 65 y.o. year old female who is a primary care patient of Delsa Grana, [place]. The CCM team was consulted to assist the patient with chronic disease management and/or care coordination needs related to: Mental Health Counseling and Resources.   Engaged with patient by telephone for follow up visit in response to provider referral for social work chronic care management and care coordination services.   Consent to Services:  The patient was given information about Chronic Care Management services, agreed to services, and gave verbal consent prior to initiation of services.  Please see initial visit note for detailed documentation.   Patient agreed to services and consent obtained.   Assessment: Review of patient past medical history, allergies, medications, and health status, including review of relevant consultants reports was performed today as part of a comprehensive evaluation and provision of chronic care management and care coordination services.     SDOH (Social Determinants of Health) assessments and interventions performed:    Advanced Directives Status: Not addressed in this encounter.  CCM Care Plan  Allergies  Allergen Reactions   Eggs Or Egg-Derived Products Swelling and Other (See Comments)    Angioedema   Atorvastatin Other (See Comments)    Liver toxicity   Influenza Virus Vaccine Swelling    Arm swelled (site of injection)   Latex Itching   Pravastatin Itching and Rash   Tape Rash and Other (See Comments)    Adhesive on foot pull off skin.  Ok to use paper tape.    Outpatient Encounter Medications as of 03/21/2021  Medication Sig Note   acetaminophen (TYLENOL) 500 MG tablet Take 1,000 mg by mouth every 6 (six) hours as needed for mild pain. Maximum of 3,000 mg per day 10/21/2020: prn   albuterol (VENTOLIN HFA) 108 (90  Base) MCG/ACT inhaler INHALE 2 PUFFS INTO THE LUNGS EVERY 4 HOURS AS NEEDED    allopurinol (ZYLOPRIM) 100 MG tablet Take 1 tablet (100 mg total) by mouth 2 (two) times daily.    aspirin EC 325 MG tablet Take 1 tablet (325 mg total) by mouth daily.    Blood Glucose Monitoring Suppl (CONTOUR NEXT MONITOR) w/Device KIT as directed.    cetirizine (ZYRTEC ALLERGY) 10 MG tablet Take 1 tablet (10 mg total) by mouth daily. (Patient not taking: Reported on 02/28/2021)    Colchicine 0.6 MG CAPS Take 0.6 mg by mouth 2 (two) times daily as needed. (Patient not taking: Reported on 02/28/2021) 10/21/2020: prn   DULoxetine (CYMBALTA) 60 MG capsule Take 120 mg by mouth daily.    ezetimibe (ZETIA) 10 MG tablet Take 1 tablet (10 mg total) by mouth daily.    fenofibrate (TRICOR) 145 MG tablet TAKE 1 TABLET BY MOUTH ONCE DAILY    gabapentin (NEURONTIN) 100 MG capsule Take by mouth. 08/24/2020: Taking 2 QHS   gabapentin (NEURONTIN) 400 MG capsule Take 400 mg by mouth 4 (four) times daily. 08/24/2020: Taking 2 BID   glimepiride (AMARYL) 2 MG tablet Take 2 mg by mouth 2 (two) times daily.     hydrochlorothiazide (MICROZIDE) 12.5 MG capsule Take 1 capsule (12.5 mg total) by mouth daily.    HYDROcodone-acetaminophen (NORCO/VICODIN) 5-325 MG tablet Take 2 tablets by mouth every 4 (four) hours as needed.    HYDROcodone-acetaminophen (NORCO/VICODIN) 5-325 MG tablet Take 1 tablet by mouth every 6 (six) hours as needed for  moderate pain.    Incontinence Supply Disposable (ASSURANCE FITTED BRIEF LARGE) MISC Use 1 brief up to 6 times a day as needed for urinary incontinence    insulin degludec (TRESIBA) 200 UNIT/ML FlexTouch Pen Inject 20 Units into the skin at bedtime.     metFORMIN (GLUCOPHAGE-XR) 500 MG 24 hr tablet Take 500 mg by mouth 3 (three) times daily.     pantoprazole (PROTONIX) 40 MG tablet Take 1 tablet (40 mg total) by mouth daily as needed (Heartburn).    tiZANidine (ZANAFLEX) 4 MG tablet TAKE ONE HALF(2MG) TO ONE AND  A HALF TABLETS (6MG) BY MOUTH EVERY 8 HOURS AS NEEDED FOR MUSCLE SPASMS/TIGHTNESS 10/21/2020: prn   triamcinolone (KENALOG) 0.1 % Apply 1 application topically 2 (two) times daily.    ULTICARE SHORT PEN NEEDLES 31G X 8 MM MISC     No facility-administered encounter medications on file as of 03/21/2021.    Patient Active Problem List   Diagnosis Date Noted   Anemia 01/21/2021   Urticaria 10/21/2020   Rash and nonspecific skin eruption 07/27/2020   Hepatotoxicity due to statin drug 07/15/2020   Vitamin D deficiency 07/12/2020   Chronic low back pain 07/12/2020   BMI 36.0-36.9,adult 12/29/2019   Sepsis without acute organ dysfunction ([place]) 12/29/2019   Post-operative state 12/29/2019   Asthma 12/29/2019   Cervical disc disorder 12/29/2019   Displacement of cervical intervertebral disc 12/29/2019   Abnormal posture 12/29/2019   Brachial radiculitis 12/29/2019   Neck pain 12/29/2019   Pain in upper limb 12/29/2019   Whiplash injury to neck 12/29/2019   Allergy to statin medication 12/01/2019   History of below knee amputation, left ([place]) 07/30/2019   Gangrene of left foot ([place]) 05/16/2019   Charcot's joint of foot, left    PAD (peripheral artery disease) ([place]) 12/05/2018   MRSA bacteremia 12/05/2018   Charcot's joint of foot due to diabetes ([place]) 09/18/2018   Class 2 severe obesity with serious comorbidity and body mass index (BMI) of 37.0 to 37.9 in adult ([place]) 11/19/2017   B12 deficiency 11/01/2017   Diabetic polyneuropathy associated with type 2 diabetes mellitus ([place]) 12/13/2015   Lumbar stenosis with neurogenic claudication 09/02/2015   Chronic constipation 07/13/2015   OSA (obstructive sleep apnea) 06/25/2015   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/23/2015   Hyperlipidemia 04/23/2015   Statin intolerance 04/23/2015   Asthma, mild intermittent 02/01/2015   Type 2 diabetes mellitus with diabetic nephropathy ([place]) 12/31/2014   Gastroesophageal reflux disease  with esophagitis 12/31/2014   Dysphagia 12/31/2014   Bilateral carpal tunnel syndrome 12/01/2014   Diverticulosis of colon 11/30/2012   Benign essential hypertension 11/28/2012    Conditions to be addressed/monitored: Depression; Mental Health Concerns   Care Plan : Depression (Adult)  Updates made by Vern Claude, LCSW since 03/21/2021 12:00 AM     Problem: Symptoms (Depression)      Goal: Symptoms Monitored and Managed   Start Date: 02/04/2021  This Visit's Progress: On track  Recent Progress: On track  Priority: Medium  Note:   Current Barriers:  Acute Mental Health needs related to current medical condition ADL IADL limitations Suicidal Ideation/Homicidal Ideation: No  Clinical Social Work Goal(s):  Over the next 90 days, patient will work with SW monthly by telephone or in person to reduce or manage symptoms related to depressed mood patient will follow up with this Education officer, museum to assess status of depressed mood  Interventions: Patient confirmed that depression symptoms have improved, however had a fall  3 weeks ago that resulted in a dislocated and fractured shoulder-supporting pin came out of her prosthetic leg causing her to fall  Patient discussed now making attempts to cope with the trauma of the fall Patient confirmed a positive support network of friends and family stating that  she stayed with her friends during her recovery Follow up scheduled today with Orthopedic doctor 3:15pm Patient continues to confirm positive mind set-using her spirituality and positive self talk as coping strategies Patient continues to  utilize [employer] to her medical appointments and the church Lucianne Lei transports her to [employer] of feelings encouraged, Fear of additional falls normalized Active listening / Reflection utilized ,  Engineer, petroleum Provided,  Positive coping strategies and self care reinforced Patient confirmed no further follow up needed at this  time  Patient Self Care Activities:  Self administers medications as prescribed Attends all scheduled provider appointments Calls pharmacy for medication refills Attends church or other social activities Performs ADL's independently Performs IADL's independently Ability for insight Independent living Strong family or social support  Patient Coping Strengths:  Supportive [place] to Communicate Effectively  Please see past updates related to this goal by clicking on the "Past Updates" button in the selected goal        Follow Up Plan: No further needs at this time, Client will contact this Education officer, museum with any additional mental health needs      [name], [place] Social Worker  [facility] Care Management [phone]

## 2021-03-31 ENCOUNTER — Telehealth: Payer: Self-pay | Admitting: Orthopedic Surgery

## 2021-03-31 NOTE — Telephone Encounter (Signed)
Pt calling to verify when her follow up appt is. It states she had is resch'd from 04/14/21 to 04/15/21 but the notes still have it listed as "call to resch appt." Could not get in touch with the nurse to verify if she needs to change again and if she can see Lurena Joiner instead. The best call back number is [phone].

## 2021-04-01 NOTE — Telephone Encounter (Signed)
IC pt this has been addressed

## 2021-04-04 ENCOUNTER — Ambulatory Visit (INDEPENDENT_AMBULATORY_CARE_PROVIDER_SITE_OTHER): Payer: Medicare Other | Admitting: Orthopedic Surgery

## 2021-04-04 ENCOUNTER — Other Ambulatory Visit: Payer: Self-pay

## 2021-04-04 ENCOUNTER — Encounter: Payer: Self-pay | Admitting: Orthopedic Surgery

## 2021-04-04 DIAGNOSIS — I10 Essential (primary) hypertension: Secondary | ICD-10-CM

## 2021-04-04 DIAGNOSIS — L308 Other specified dermatitis: Secondary | ICD-10-CM

## 2021-04-04 DIAGNOSIS — Z794 Long term (current) use of insulin: Secondary | ICD-10-CM | POA: Diagnosis not present

## 2021-04-04 DIAGNOSIS — Z89512 Acquired absence of left leg below knee: Secondary | ICD-10-CM | POA: Diagnosis not present

## 2021-04-04 DIAGNOSIS — E1121 Type 2 diabetes mellitus with diabetic nephropathy: Secondary | ICD-10-CM

## 2021-04-04 DIAGNOSIS — E785 Hyperlipidemia, unspecified: Secondary | ICD-10-CM

## 2021-04-04 NOTE — Progress Notes (Signed)
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  Patient states she has been getting pimples and rash on the residual limb.  She states sometimes these little nodules will drain.  Assessment & Plan: Visit Diagnoses:  1. Hx of BKA, left ([place])   2. Dermatitis associated with moisture     Plan: Patient was given a new prosthetic under her liner garment size large.  She will wear this at all times when wearing her prosthesis.  Patient states her leg felt better with it on.  Follow-Up Instructions: Return in about 4 weeks (around 05/02/2021).   Ortho Exam  Patient is alert, oriented, no adenopathy, well-dressed, normal affect, normal respiratory effort. Examination patient has dermatitis with blister rashes from sweating in the silicone liner.  There is no cellulitis no full-thickness ulcers.  Imaging: No results found.    Labs: Lab Results  Component Value Date   HGBA1C 7.2 (H) 01/21/2021   HGBA1C 7.5 07/01/2020   HGBA1C 7.3 (H) 05/16/2019   ESRSEDRATE 60 (H) 12/05/2018   ESRSEDRATE 41 (H) 12/04/2018   ESRSEDRATE 30 07/12/2018   CRP 22.0 (H) 12/05/2018   CRP 20 (H) 12/04/2018   CRP 6 07/12/2018   LABURIC 5.4 04/22/2020   LABURIC 8.7 (H) 03/25/2020   LABURIC 8.2 (H) 11/30/2015   REPTSTATUS 12/12/2018 FINAL 12/06/2018   GRAMSTAIN  12/06/2018    RARE WBC PRESENT, PREDOMINANTLY PMN NO ORGANISMS SEEN    CULT  12/06/2018    RARE METHICILLIN RESISTANT STAPHYLOCOCCUS AUREUS WITH NORMAL SKIN FLORA NO ANAEROBES ISOLATED Performed at [facility], [place] [address], [place],  [place] [address]    LABORGA METHICILLIN RESISTANT STAPHYLOCOCCUS AUREUS 12/06/2018     Lab Results  Component Value Date   ALBUMIN 3.7 12/04/2018   ALBUMIN 4.0 11/27/2018   ALBUMIN 3.8 03/11/2018   PREALBUMIN 21.5 03/12/2018    No results found for: MG Lab Results  Component Value Date   VD25OH 11 (L) 09/20/2016    Lab Results  Component Value Date   PREALBUMIN 21.5 03/12/2018   CBC EXTENDED Latest Ref Rng & Units 01/21/2021 07/12/2020 05/16/2019  WBC 3.8 - 10.8 Thousand/uL 9.1 7.6 8.4  RBC 3.80 - 5.10 Million/uL 4.68 4.40 4.35  HGB 11.7 - 15.5 g/dL 11.6(L) 11.3(L) 11.2(L)  HCT 35.0 - 45.0 % 37.7 35.6 36.1  PLT 140 - 400 Thousand/uL 330 344 310  NEUTROABS 1,500 - 7,800 cells/uL 5,296 4,218 -  LYMPHSABS 850 - 3,900 cells/uL 2,803 2,622 -     There is no height or weight on file to calculate BMI.  Orders:  No orders of the defined types were placed in this encounter.  No orders of the defined types were placed in this encounter.    Procedures: No procedures performed  Clinical Data: No additional findings.  ROS:  All other systems negative, except as noted in the HPI. Review of Systems  Objective: Vital  Signs: LMP  (LMP Unknown)   Specialty Comments:  No specialty comments available.  PMFS History: Patient Active Problem List   Diagnosis Date Noted   Anemia 01/21/2021   Urticaria 10/21/2020   Rash and nonspecific skin eruption 07/27/2020   Hepatotoxicity due to statin drug 07/15/2020   Vitamin D deficiency 07/12/2020   Chronic low back pain 07/12/2020   BMI 36.0-36.9,adult 12/29/2019   Sepsis without acute organ dysfunction ([place]) 12/29/2019   Post-operative state 12/29/2019   Asthma 12/29/2019   Cervical disc disorder 12/29/2019   Displacement of cervical intervertebral disc 12/29/2019   Abnormal posture 12/29/2019   Brachial radiculitis 12/29/2019   Neck pain 12/29/2019   Pain in upper limb 12/29/2019   Whiplash injury to neck 12/29/2019   Allergy  to statin medication 12/01/2019   History of below knee amputation, left ([place]) 07/30/2019   Gangrene of left foot ([place]) 05/16/2019   Charcot's joint of foot, left    PAD (peripheral artery disease) ([place]) 12/05/2018   MRSA bacteremia 12/05/2018   Charcot's joint of foot due to diabetes ([place]) 09/18/2018   Class 2 severe obesity with serious comorbidity and body mass index (BMI) of 37.0 to 37.9 in adult ([place]) 11/19/2017   B12 deficiency 11/01/2017   Diabetic polyneuropathy associated with type 2 diabetes mellitus ([place]) 12/13/2015   Lumbar stenosis with neurogenic claudication 09/02/2015   Chronic constipation 07/13/2015   OSA (obstructive sleep apnea) 06/25/2015   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/23/2015   Hyperlipidemia 04/23/2015   Statin intolerance 04/23/2015   Asthma, mild intermittent 02/01/2015   Type 2 diabetes mellitus with diabetic nephropathy ([place]) 12/31/2014   Gastroesophageal reflux disease with esophagitis 12/31/2014   Dysphagia 12/31/2014   Bilateral carpal tunnel syndrome 12/01/2014   Diverticulosis of colon 11/30/2012   Benign essential hypertension 11/28/2012   Past Medical History:  Diagnosis Date   Anemia    Arthritis    Asthma    Colon polyps    adenomatous   Diabetes mellitus without complication ([place])    Diabetic infection of left foot ([place]) 12/2018   Diverticulosis of colon    Esophagitis    GERD (gastroesophageal reflux disease)    Hemorrhoid    internal   Hyperlipemia    Hypertension    IBS (irritable bowel syndrome)    no current prob - diet controlled   Myalgia due to statin 11/19/2017   Neuropathy    Neuropathy of both feet    Neuropathy of hand    Pneumonia    x 4   PONV (postoperative nausea and vomiting)    on some surgeries but not all procedures   Skin ulcer of right ankle, limited to breakdown of skin ([place]) 12/31/2016   resolved per patient 05/14/19   Sleep apnea    has had in the past lost 50 pounds and do longer  uses cpap   Statin intolerance 04/23/2015   Stroke ([place]) 2009   mini stroke per patient   Stroke-like episode 2009   TIA - mini stroke per patient   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 07/13/2015   Uncontrolled type 2 diabetes mellitus with hyperglycemia ([place]) 11/01/2017    Family History  Problem Relation Age of Onset   Lung cancer Father    Hypertension Father    Arthritis Father    Other Mother        hardening of the arteries/renal cell carcenoma   Hypertension Mother    Stroke Mother    Kidney  cancer Mother    Arthritis Brother    Rheum arthritis Maternal Uncle    Bladder Cancer Neg Hx     Past Surgical History:  Procedure Laterality Date   ABDOMINAL HYSTERECTOMY     AMPUTATION Left 12/06/2018   Procedure: PARTIAL AMPUTATION LEFT FOOT;  Surgeon: Edrick Kins, DPM;  Location: [facility];  Service: Podiatry;  Laterality: Left;   AMPUTATION Left 05/16/2019   Procedure: LEFT BELOW KNEE AMPUTATION;  Surgeon: Newt Minion, MD;  Location: [facility];  Service: Orthopedics;  Laterality: Left;   BACK SURGERY  09/02/2015   BONE BIOPSY Left 12/06/2018   Procedure: Bone Biopsy;  Surgeon: Edrick Kins, DPM;  Location: [facility];  Service: Podiatry;  Laterality: Left;   [place]   COLONOSCOPY N/A 11/30/2012   Procedure: COLONOSCOPY;  Surgeon: Irene Shipper, MD;  Location: [facility] ENDOSCOPY;  Service: Endoscopy;  Laterality: N/A;   COLONOSCOPY WITH PROPOFOL N/A 01/03/2016   Procedure: COLONOSCOPY WITH PROPOFOL;  Surgeon: Manya Silvas, MD;  Location[facility]COPY;  Service: Endoscopy;  Laterality: N/A;   ESOPHAGOGASTRODUODENOSCOPY (EGD) WITH PROPOFOL N/A 01/14/2015   Procedure: ESOPHAGOGASTRODUODENOSCOPY (EGD) WITH PROPOFOL;  Surgeon: Manya Silvas, MD;  Location[facility]COPY;  Service: Endoscopy;  Laterality: N/A;   IRRIGATION AND DEBRIDEMENT FOOT Left 03/13/2018   Procedure: IRRIGATION AND DEBRIDEMENT FOOT WITH BONE BIOPSY WITH MISONIX  DEBRIDER;  Surgeon: Evelina Bucy, DPM;  Location: [facility];  Service: Podiatry;  Laterality: Left;   IRRIGATION AND DEBRIDEMENT FOOT Left 12/06/2018   Procedure: Irrigation And Debridement Foot;  Surgeon: Edrick Kins, DPM;  Location: [facility];  Service: Podiatry;  Laterality: Left;   LEG AMPUTATION BELOW KNEE Left    LUMBAR WOUND DEBRIDEMENT N/A 10/01/2015   Procedure: LUMBAR WOUND DEBRIDEMENT;  Surgeon: Ashok Pall, MD;  Location: [facility] [facility]URO ORS;  Service: Neurosurgery;  Laterality: N/A;  LUMBAR WOUND DEBRIDEMENT   NASAL SINUS SURGERY     SAVORY DILATION N/A 01/14/2015   Procedure: SAVORY DILATION;  Surgeon: Manya Silvas, MD;  Location: [facility]OSCOPY;  Service: Endoscopy;  Laterality: N/A;   TONSILLECTOMY     WISDOM TOOTH EXTRACTION     Social History   Occupational History   Occupation: disabled  Tobacco Use   Smoking status: Never   Smokeless tobacco: Never  Vaping Use   Vaping Use: Never used  Substance and Sexual Activity   Alcohol use: Never   Drug use: No   Sexual activity: Not Currently    Comment: Hysterectomy

## 2021-04-14 ENCOUNTER — Ambulatory Visit: Payer: Medicare Other | Admitting: Orthopedic Surgery

## 2021-04-15 ENCOUNTER — Ambulatory Visit: Payer: Medicare Other | Admitting: Orthopedic Surgery

## 2021-04-18 ENCOUNTER — Ambulatory Visit (INDEPENDENT_AMBULATORY_CARE_PROVIDER_SITE_OTHER): Payer: Medicare Other | Admitting: Orthopedic Surgery

## 2021-04-18 ENCOUNTER — Ambulatory Visit (INDEPENDENT_AMBULATORY_CARE_PROVIDER_SITE_OTHER): Payer: Medicare Other

## 2021-04-18 ENCOUNTER — Ambulatory Visit: Payer: Self-pay

## 2021-04-18 ENCOUNTER — Other Ambulatory Visit: Payer: Self-pay

## 2021-04-18 DIAGNOSIS — S42135A Nondisplaced fracture of coracoid process, left shoulder, initial encounter for closed fracture: Secondary | ICD-10-CM | POA: Diagnosis not present

## 2021-04-18 DIAGNOSIS — M25522 Pain in left elbow: Secondary | ICD-10-CM

## 2021-04-18 DIAGNOSIS — S42295A Other nondisplaced fracture of upper end of left humerus, initial encounter for closed fracture: Secondary | ICD-10-CM

## 2021-04-18 DIAGNOSIS — M25512 Pain in left shoulder: Secondary | ICD-10-CM

## 2021-04-20 ENCOUNTER — Encounter: Payer: Self-pay | Admitting: Orthopedic Surgery

## 2021-04-20 NOTE — Progress Notes (Signed)
Post-Op Visit Note   Patient: Olivia Romero           Date of Birth: 1956-04-15           MRN: [id number] Visit Date: 04/18/2021 PCP: Delsa Grana, PA-C   Assessment & Plan:  Chief Complaint:  Chief Complaint  Patient presents with   Left Shoulder - Follow-up, Fracture    DOI 02/28/2021   Left Elbow - Fracture, Follow-up   Visit Diagnoses:  1. Left shoulder pain, unspecified chronicity   2. Pain in left elbow     Plan: Olivia Romero is a patient with left proximal humerus fracture and nondisplaced left elbow fracture.  She states the left shoulder feels better than it did.  Pain is still there but not quite as bad.  Left elbow is also improving.  On exam today her deltoid does fire.  There are some question last clinic visit about whether or not the axillary nerve was working.  The shoulder does move as a unit with passive range of motion.  I demonstrated for her today pendulum exercises.  Discontinue sling for now.  Left elbow has good range of motion as well with good motor or sensory function to the hand.  Lacks about 10 degrees of full extension and 20 degrees of full flexion on the elbow.  Follow-up in 4 weeks for clinical recheck.  She wants to try to do these exercises on her own.  I did tell her it will be a long process to get her range of motion back.  Follow-Up Instructions: No follow-ups on file.   Orders:  Orders Placed This Encounter  Procedures   XR Shoulder Left   XR Elbow 2 Views Left   No orders of the defined types were placed in this encounter.   Imaging: No results found.  PMFS History: Patient Active Problem List   Diagnosis Date Noted   Anemia 01/21/2021   Urticaria 10/21/2020   Rash and nonspecific skin eruption 07/27/2020   Hepatotoxicity due to statin drug 07/15/2020   Vitamin D deficiency 07/12/2020   Chronic low back pain 07/12/2020   BMI 36.0-36.9,adult 12/29/2019   Sepsis without acute organ dysfunction ([place]) 12/29/2019   Post-operative  state 12/29/2019   Asthma 12/29/2019   Cervical disc disorder 12/29/2019   Displacement of cervical intervertebral disc 12/29/2019   Abnormal posture 12/29/2019   Brachial radiculitis 12/29/2019   Neck pain 12/29/2019   Pain in upper limb 12/29/2019   Whiplash injury to neck 12/29/2019   Allergy to statin medication 12/01/2019   History of below knee amputation, left ([place]) 07/30/2019   Gangrene of left foot ([place]) 05/16/2019   Charcot's joint of foot, left    PAD (peripheral artery disease) ([place]) 12/05/2018   MRSA bacteremia 12/05/2018   Charcot's joint of foot due to diabetes ([place]) 09/18/2018   Class 2 severe obesity with serious comorbidity and body mass index (BMI) of 37.0 to 37.9 in adult ([place]) 11/19/2017   B12 deficiency 11/01/2017   Diabetic polyneuropathy associated with type 2 diabetes mellitus ([place]) 12/13/2015   Lumbar stenosis with neurogenic claudication 09/02/2015   Chronic constipation 07/13/2015   OSA (obstructive sleep apnea) 06/25/2015   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/23/2015   Hyperlipidemia 04/23/2015   Statin intolerance 04/23/2015   Asthma, mild intermittent 02/01/2015   Type 2 diabetes mellitus with diabetic nephropathy ([place]) 12/31/2014   Gastroesophageal reflux disease with esophagitis 12/31/2014   Dysphagia 12/31/2014   Bilateral carpal tunnel syndrome 12/01/2014  Diverticulosis of colon 11/30/2012   Benign essential hypertension 11/28/2012   Past Medical History:  Diagnosis Date   Anemia    Arthritis    Asthma    Colon polyps    adenomatous   Diabetes mellitus without complication ([place])    Diabetic infection of left foot ([place]) 12/2018   Diverticulosis of colon    Esophagitis    GERD (gastroesophageal reflux disease)    Hemorrhoid    internal   Hyperlipemia    Hypertension    IBS (irritable bowel syndrome)    no current prob - diet controlled   Myalgia due to statin 11/19/2017   Neuropathy    Neuropathy of both feet     Neuropathy of hand    Pneumonia    x 4   PONV (postoperative nausea and vomiting)    on some surgeries but not all procedures   Skin ulcer of right ankle, limited to breakdown of skin ([place]) 12/31/2016   resolved per patient 05/14/19   Sleep apnea    has had in the past lost 50 pounds and do longer uses cpap   Statin intolerance 04/23/2015   Stroke ([place]) 2009   mini stroke per patient   Stroke-like episode 2009   TIA - mini stroke per patient   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 07/13/2015   Uncontrolled type 2 diabetes mellitus with hyperglycemia ([place]) 11/01/2017    Family History  Problem Relation Age of Onset   Lung cancer Father    Hypertension Father    Arthritis Father    Other Mother        hardening of the arteries/renal cell carcenoma   Hypertension Mother    Stroke Mother    Kidney cancer Mother    Arthritis Brother    Rheum arthritis Maternal Uncle    Bladder Cancer Neg Hx     Past Surgical History:  Procedure Laterality Date   ABDOMINAL HYSTERECTOMY     AMPUTATION Left 12/06/2018   Procedure: PARTIAL AMPUTATION LEFT FOOT;  Surgeon: Edrick Kins, DPM;  Location: [facility] [facility];  Service: Podiatry;  Laterality: Left;   AMPUTATION Left 05/16/2019   Procedure: LEFT BELOW KNEE AMPUTATION;  Surgeon: Newt Minion, MD;  Location: [facility];  Service: Orthopedics;  Laterality: Left;   BACK SURGERY  09/02/2015   BONE BIOPSY Left 12/06/2018   Procedure: Bone Biopsy;  Surgeon: Edrick Kins, DPM;  Location: [facility];  Service: Podiatry;  Laterality: Left;   [place]   COLONOSCOPY N/A 11/30/2012   Procedure: COLONOSCOPY;  Surgeon: Irene Shipper, MD;  Location: [facility]OSCOPY;  Service: Endoscopy;  Laterality: N/A;   COLONOSCOPY WITH PROPOFOL N/A 01/03/2016   Procedure: COLONOSCOPY WITH PROPOFOL;  Surgeon: Manya Silvas, MD;  Location[facility] ENDOSCOPY;  Service: Endoscopy;  Laterality: N/A;   ESOPHAGOGASTRODUODENOSCOPY (EGD) WITH PROPOFOL  N/A 01/14/2015   Procedure: ESOPHAGOGASTRODUODENOSCOPY (EGD) WITH PROPOFOL;  Surgeon: Manya Silvas, MD;  Location: [facility] ENDOSCOPY;  Service: Endoscopy;  Laterality: N/A;   IRRIGATION AND DEBRIDEMENT FOOT Left 03/13/2018   Procedure: IRRIGATION AND DEBRIDEMENT FOOT WITH BONE BIOPSY WITH MISONIX DEBRIDER;  Surgeon: Evelina Bucy, DPM;  Location: [facility];  Service: Podiatry;  Laterality: Left;   IRRIGATION AND DEBRIDEMENT FOOT Left 12/06/2018   Procedure: Irrigation And Debridement Foot;  Surgeon: Edrick Kins, DPM;  Location: [place];  Service: Podiatry;  Laterality: Left;   LEG AMPUTATION BELOW KNEE Left  LUMBAR WOUND DEBRIDEMENT N/A 10/01/2015   Procedure: LUMBAR WOUND DEBRIDEMENT;  Surgeon: Ashok Pall, MD;  Location: [place] NEURO ORS;  Service: Neurosurgery;  Laterality: N/A;  LUMBAR WOUND DEBRIDEMENT   NASAL SINUS SURGERY     SAVORY DILATION N/A 01/14/2015   Procedure: SAVORY DILATION;  Surgeon: Manya Silvas, MD;  Location: [facility] ENDOSCOPY;  Service: Endoscopy;  Laterality: N/A;   TONSILLECTOMY     WISDOM TOOTH EXTRACTION     Social History   Occupational History   Occupation: disabled  Tobacco Use   Smoking status: Never   Smokeless tobacco: Never  Vaping Use   Vaping Use: Never used  Substance and Sexual Activity   Alcohol use: Never   Drug use: No   Sexual activity: Not Currently    Comment: Hysterectomy

## 2021-04-25 ENCOUNTER — Telehealth: Payer: Self-pay

## 2021-04-25 NOTE — Telephone Encounter (Signed)
Pt has an appt with Dr. Sharol Given tomorrow.

## 2021-04-25 NOTE — Telephone Encounter (Signed)
Pt called into the office stating that she had a pimple that came up beside the wound that he was treating it has not burst and is open she wanted to know if he would want to see her sooner or wait till her upcoming apt Monday.   Please advise

## 2021-04-26 ENCOUNTER — Other Ambulatory Visit: Payer: Self-pay

## 2021-04-26 ENCOUNTER — Encounter: Payer: Self-pay | Admitting: Orthopedic Surgery

## 2021-04-26 ENCOUNTER — Ambulatory Visit (INDEPENDENT_AMBULATORY_CARE_PROVIDER_SITE_OTHER): Payer: Medicare Other | Admitting: Orthopedic Surgery

## 2021-04-26 DIAGNOSIS — S80822A Blister (nonthermal), left lower leg, initial encounter: Secondary | ICD-10-CM | POA: Diagnosis not present

## 2021-04-26 DIAGNOSIS — Z89512 Acquired absence of left leg below knee: Secondary | ICD-10-CM | POA: Diagnosis not present

## 2021-04-26 MED ORDER — DOXYCYCLINE HYCLATE 100 MG PO TABS: 100.0000 mg | ORAL_TABLET | Freq: Two times a day (BID) | ORAL | 0 refills | Status: DC

## 2021-04-26 NOTE — Progress Notes (Signed)
Office Visit Note   Patient: Olivia Romero           Date of Birth: 19-Jan-1956           MRN: [id number] Visit Date: 04/26/2021              Requested by: Delsa Grana, PA-C [address] [place],  [place] [address] PCP: Delsa Grana, PA-C  Chief Complaint  Patient presents with   Left Leg - Pain    HX BKA       HPI: Patient is a 65 year old woman with a left transtibial amputation she is currently developed new blisters on her left residual limb.  She states that on Sunday it ruptured and has been draining she has been wearing her short stump shrinker.  Assessment & Plan: Visit Diagnoses:  1. Hx of BKA, left ([place])   2. Blister of left lower leg, initial encounter     Plan: A prescription for doxycycline is called and she will continue with the short stump shrinker against the skin under the liner.  This blister may be secondary to sweating underneath the liner.  Follow-Up Instructions: Return in about 3 weeks (around 05/17/2021).   Ortho Exam  Patient is alert, oriented, no adenopathy, well-dressed, normal affect, normal respiratory effort. Examination patient has 2 blisters on the left leg these do not probe to bone there is superficial approximately 5 mm in diameter.  There is no surrounding cellulitis.  The wound bed has healthy granulation tissue it is flat.  Imaging: No results found. No images are attached to the encounter.  Labs: Lab Results  Component Value Date   HGBA1C 7.2 (H) 01/21/2021   HGBA1C 7.5 07/01/2020   HGBA1C 7.3 (H) 05/16/2019   ESRSEDRATE 60 (H) 12/05/2018   ESRSEDRATE 41 (H) 12/04/2018   ESRSEDRATE 30 07/12/2018   CRP 22.0 (H) 12/05/2018   CRP 20 (H) 12/04/2018   CRP 6 07/12/2018   LABURIC 5.4 04/22/2020   LABURIC 8.7 (H) 03/25/2020   LABURIC 8.2 (H) 11/30/2015   REPTSTATUS 12/12/2018 FINAL 12/06/2018   GRAMSTAIN  12/06/2018    RARE WBC PRESENT, PREDOMINANTLY PMN NO ORGANISMS SEEN    CULT  12/06/2018    RARE  METHICILLIN RESISTANT STAPHYLOCOCCUS AUREUS WITH NORMAL SKIN FLORA NO ANAEROBES ISOLATED Performed at [facility], [place] [address] [place], [place] [address]    LABORGA METHICILLIN RESISTANT STAPHYLOCOCCUS AUREUS 12/06/2018     Lab Results  Component Value Date   ALBUMIN 3.7 12/04/2018   ALBUMIN 4.0 11/27/2018   ALBUMIN 3.8 03/11/2018   PREALBUMIN 21.5 03/12/2018    No results found for: MG Lab Results  Component Value Date   VD25OH 11 (L) 09/20/2016    Lab Results  Component Value Date   PREALBUMIN 21.5 03/12/2018   CBC EXTENDED Latest Ref Rng & Units 01/21/2021 07/12/2020 05/16/2019  WBC 3.8 - 10.8 Thousand/uL 9.1 7.6 8.4  RBC 3.80 - 5.10 Million/uL 4.68 4.40 4.35  HGB 11.7 - 15.5 g/dL 11.6(L) 11.3(L) 11.2(L)  HCT 35.0 - 45.0 % 37.7 35.6 36.1  PLT 140 - 400 Thousand/uL 330 344 310  NEUTROABS 1,500 - 7,800 cells/uL 5,296 4,218 -  LYMPHSABS 850 - 3,900 cells/uL 2,803 2,622 -     There is no height or weight on file to calculate BMI.  Orders:  No orders of the defined types were placed in this encounter.  No orders of the defined types were placed in this encounter.  Procedures: No procedures performed  Clinical Data: No additional findings.  ROS:  All other systems negative, except as noted in the HPI. Review of Systems  Objective: Vital Signs: LMP  (LMP Unknown)   Specialty Comments:  No specialty comments available.  PMFS History: Patient Active Problem List   Diagnosis Date Noted   Anemia 01/21/2021   Urticaria 10/21/2020   Rash and nonspecific skin eruption 07/27/2020   Hepatotoxicity due to statin drug 07/15/2020   Vitamin D deficiency 07/12/2020   Chronic low back pain 07/12/2020   BMI 36.0-36.9,adult 12/29/2019   Sepsis without acute organ dysfunction ([place]) 12/29/2019   Post-operative state 12/29/2019   Asthma 12/29/2019   Cervical disc disorder 12/29/2019   Displacement of cervical intervertebral disc 12/29/2019   Abnormal  posture 12/29/2019   Brachial radiculitis 12/29/2019   Neck pain 12/29/2019   Pain in upper limb 12/29/2019   Whiplash injury to neck 12/29/2019   Allergy to statin medication 12/01/2019   History of below knee amputation, left ([place]) 07/30/2019   Gangrene of left foot ([place]) 05/16/2019   Charcot's joint of foot, left    PAD (peripheral artery disease) ([place]) 12/05/2018   MRSA bacteremia 12/05/2018   Charcot's joint of foot due to diabetes ([place]) 09/18/2018   Class 2 severe obesity with serious comorbidity and body mass index (BMI) of 37.0 to 37.9 in adult ([place]) 11/19/2017   B12 deficiency 11/01/2017   Diabetic polyneuropathy associated with type 2 diabetes mellitus ([place]) 12/13/2015   Lumbar stenosis with neurogenic claudication 09/02/2015   Chronic constipation 07/13/2015   OSA (obstructive sleep apnea) 06/25/2015   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/23/2015   Hyperlipidemia 04/23/2015   Statin intolerance 04/23/2015   Asthma, mild intermittent 02/01/2015   Type 2 diabetes mellitus with diabetic nephropathy ([place]) 12/31/2014   Gastroesophageal reflux disease with esophagitis 12/31/2014   Dysphagia 12/31/2014   Bilateral carpal tunnel syndrome 12/01/2014   Diverticulosis of colon 11/30/2012   Benign essential hypertension 11/28/2012   Past Medical History:  Diagnosis Date   Anemia    Arthritis    Asthma    Colon polyps    adenomatous   Diabetes mellitus without complication ([place])    Diabetic infection of left foot ([place]) 12/2018   Diverticulosis of colon    Esophagitis    GERD (gastroesophageal reflux disease)    Hemorrhoid    internal   Hyperlipemia    Hypertension    IBS (irritable bowel syndrome)    no current prob - diet controlled   Myalgia due to statin 11/19/2017   Neuropathy    Neuropathy of both feet    Neuropathy of hand    Pneumonia    x 4   PONV (postoperative nausea and vomiting)    on some surgeries but not all procedures   Skin ulcer of  right ankle, limited to breakdown of skin ([place]) 12/31/2016   resolved per patient 05/14/19   Sleep apnea    has had in the past lost 50 pounds and do longer uses cpap   Statin intolerance 04/23/2015   Stroke ([place]) 2009   mini stroke per patient   Stroke-like episode 2009   TIA - mini stroke per patient   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 07/13/2015   Uncontrolled type 2 diabetes mellitus with hyperglycemia ([place]) 11/01/2017    Family History  Problem Relation Age of Onset   Lung cancer Father    Hypertension Father    Arthritis Father  Other Mother        hardening of the arteries/renal cell carcenoma   Hypertension Mother    Stroke Mother    Kidney cancer Mother    Arthritis Brother    Rheum arthritis Maternal Uncle    Bladder Cancer Neg Hx     Past Surgical History:  Procedure Laterality Date   ABDOMINAL HYSTERECTOMY     AMPUTATION Left 12/06/2018   Procedure: PARTIAL AMPUTATION LEFT FOOT;  Surgeon: Edrick Kins, DPM;  Location: [place];  Service: Podiatry;  Laterality: Left;   AMPUTATION Left 05/16/2019   Procedure: LEFT BELOW KNEE AMPUTATION;  Surgeon: Newt Minion, MD;  Location: [place];  Service: Orthopedics;  Laterality: Left;   BACK SURGERY  09/02/2015   BONE BIOPSY Left 12/06/2018   Procedure: Bone Biopsy;  Surgeon: Edrick Kins, DPM;  Location: [place];  Service: Podiatry;  Laterality: Left;   [place]   COLONOSCOPY N/A 11/30/2012   Procedure: COLONOSCOPY;  Surgeon: Irene Shipper, MD;  Location: [facility];  Service: Endoscopy;  Laterality: N/A;   COLONOSCOPY WITH PROPOFOL N/A 01/03/2016   Procedure: COLONOSCOPY WITH PROPOFOL;  Surgeon: Manya Silvas, MD;  Location: [facility] ENDOSCOPY;  Service: Endoscopy;  Laterality: N/A;   ESOPHAGOGASTRODUODENOSCOPY (EGD) WITH PROPOFOL N/A 01/14/2015   Procedure: ESOPHAGOGASTRODUODENOSCOPY (EGD) WITH PROPOFOL;  Surgeon: Manya Silvas, MD;  Location: [facility] ENDOSCOPY;  Service:  Endoscopy;  Laterality: N/A;   IRRIGATION AND DEBRIDEMENT FOOT Left 03/13/2018   Procedure: IRRIGATION AND DEBRIDEMENT FOOT WITH BONE BIOPSY WITH MISONIX DEBRIDER;  Surgeon: Evelina Bucy, DPM;  Location: [place];  Service: Podiatry;  Laterality: Left;   IRRIGATION AND DEBRIDEMENT FOOT Left 12/06/2018   Procedure: Irrigation And Debridement Foot;  Surgeon: Edrick Kins, DPM;  Location: [place];  Service: Podiatry;  Laterality: Left;   LEG AMPUTATION BELOW KNEE Left    LUMBAR WOUND DEBRIDEMENT N/A 10/01/2015   Procedure: LUMBAR WOUND DEBRIDEMENT;  Surgeon: Ashok Pall, MD;  Location: [place] [facility];  Service: Neurosurgery;  Laterality: N/A;  LUMBAR WOUND DEBRIDEMENT   NASAL SINUS SURGERY     SAVORY DILATION N/A 01/14/2015   Procedure: SAVORY DILATION;  Surgeon: Manya Silvas, MD;  Location: [facility] END[facility]COPY;  Service: Endoscopy;  Laterality: N/A;   TONSILLECTOMY     WISDOM TOOTH EXTRACTION     Social History   Occupational History   Occupation: disabled  Tobacco Use   Smoking status: Never   Smokeless tobacco: Never  Vaping Use   Vaping Use: Never used  Substance and Sexual Activity   Alcohol use: Never   Drug use: No   Sexual activity: Not Currently    Comment: Hysterectomy

## 2021-05-02 ENCOUNTER — Ambulatory Visit: Payer: Medicare Other | Admitting: Orthopedic Surgery

## 2021-05-16 ENCOUNTER — Other Ambulatory Visit: Payer: Self-pay

## 2021-05-16 ENCOUNTER — Ambulatory Visit (INDEPENDENT_AMBULATORY_CARE_PROVIDER_SITE_OTHER): Payer: Medicare Other | Admitting: Orthopedic Surgery

## 2021-05-16 ENCOUNTER — Ambulatory Visit (INDEPENDENT_AMBULATORY_CARE_PROVIDER_SITE_OTHER): Payer: Medicare Other

## 2021-05-16 DIAGNOSIS — S4292XA Fracture of left shoulder girdle, part unspecified, initial encounter for closed fracture: Secondary | ICD-10-CM | POA: Diagnosis not present

## 2021-05-17 ENCOUNTER — Ambulatory Visit: Payer: Medicare Other | Admitting: Orthopedic Surgery

## 2021-05-18 ENCOUNTER — Other Ambulatory Visit: Payer: Self-pay

## 2021-05-18 ENCOUNTER — Telehealth (INDEPENDENT_AMBULATORY_CARE_PROVIDER_SITE_OTHER): Payer: Medicare Other | Admitting: Internal Medicine

## 2021-05-18 DIAGNOSIS — U071 COVID-19: Secondary | ICD-10-CM

## 2021-05-18 MED ORDER — MOLNUPIRAVIR EUA 200MG CAPSULE
4.0000 | ORAL_CAPSULE | Freq: Two times a day (BID) | ORAL | 0 refills | Status: AC
  Filled 2021-05-18: qty 40, 5d supply, fill #0

## 2021-05-18 MED ORDER — FLUTICASONE PROPIONATE 50 MCG/ACT NA SUSP: 2.0000 | Freq: Every day | NASAL | 6 refills | Status: DC

## 2021-05-18 MED ORDER — BENZONATATE 100 MG PO CAPS: 100.0000 mg | ORAL_CAPSULE | Freq: Two times a day (BID) | ORAL | 0 refills | Status: DC | PRN

## 2021-05-18 NOTE — Patient Instructions (Addendum)
It was great seeing you today!  Plan discussed at today's visit: -Anti-viral medication sent to pharmacy at the hospital -Cough medication and flonase sent to [place] -Use Albuterol inhaler as needed, can send in a steroid inhaler as well -recommend zinc and Vitamin D supplements as well as a pulse oximeter to monitor oxygen saturation (if pulse ox < 88% and not coming up, present to the ED) -Please call with any questions/concerns  Follow up in: as needed  Take care and let us know if you have any questions or concerns prior to your next visit.  Dr. Rosana Berger

## 2021-05-18 NOTE — Progress Notes (Signed)
Virtual Visit via Telephone Note  I connected with Olivia Romero on 05/18/21 at  8:00 AM EST by telephone and verified that I am speaking with the correct person using two identifiers.  Location: Patient: Home  Provider: [facility]   I discussed the limitations, risks, security and privacy concerns of performing an evaluation and management service by telephone and the availability of in person appointments. I also discussed with the patient that there may be a patient responsible charge related to this service. The patient expressed understanding and agreed to proceed.   History of Present Illness:  Olivia Romero is a 65 year old female presenting over the phone after being COVID positive on 05/17/21. Symptoms began 05/15/21 with sinus drainage.   URI Compliant:  -Worst symptom: cough  -Fever:  elevated temps to 100 -Cough: yes dry cough  -Shortness of breath:  sometimes  -Wheezing: no -Chest pain: yes, with cough -Chest tightness: no -Chest congestion: no -Nasal congestion: yes -Runny nose: yes -Post nasal drip: yes -Sneezing: no -Sore throat: yes -Vomiting: no -Diarrhea: yes  -Fatigue: yes -Sick contacts: yes, other people positive in house  -Context: worse -Relief with OTC cold/cough medications: no  -Treatments attempted:  cough drops    Observations/Objective:  General: no acute distress Pulm: occasional dry cough over phone Neuro: answers questions appropriately.   Assessment and Plan:  1. COVID-19: Positive yesterday, symptoms 4 days ago. We will treat with antiviral therapy, as well as cough suppressants and nasal steroid for symptoms. Discussed obtaining a pulse ox to monitor oxygen saturation and when to go to emergency department should the need arise. Follow up as needed.   - molnupiravir EUA (LAGEVRIO) 200 mg CAPS capsule; Take 4 capsules (800 mg total) by mouth 2 (two) times daily for 5 days.  Dispense: 40 capsule; Refill: 0 - benzonatate (TESSALON) 100  MG capsule; Take 1 capsule (100 mg total) by mouth 2 (two) times daily as needed for cough.  Dispense: 20 capsule; Refill: 0 - fluticasone (FLONASE) 50 MCG/ACT nasal spray; Place 2 sprays into both nostrils daily.  Dispense: 16 g; Refill:    I discussed the assessment and treatment plan with the patient. The patient was provided an opportunity to ask questions and all were answered. The patient agreed with the plan and demonstrated an understanding of the instructions.   The patient was advised to call back or seek an in-person evaluation if the symptoms worsen or if the condition fails to improve as anticipated.  I provided 35 minutes of non-face-to-face time during this encounter.   Teodora Medici, DO

## 2021-05-22 ENCOUNTER — Encounter: Payer: Self-pay | Admitting: Orthopedic Surgery

## 2021-05-22 NOTE — Progress Notes (Signed)
Post-fracture visit Note   Patient: LORIANNA SPADACCINI           Date of Birth: 1955-12-02           MRN: [id number] Visit Date: 05/16/2021 PCP: Delsa Grana, PA-C   Assessment & Plan:  Chief Complaint:  Chief Complaint  Patient presents with   Left Shoulder - Follow-up   Visit Diagnoses:  1. Shoulder fracture, left, closed, initial encounter     Plan: Patient is a 65 year old female who presents for evaluation of left shoulder following proximal humerus fracture sustained on 02/28/2021.  She reports she still having some pain and then had a recurrent fall last night with some increased pain since then.  She states that the increased pain feels nothing like her initial fracture.  She has not noticed any increase in swelling or new bruising/ecchymosis.  She describes a soreness in the mid humeral region without radicular pain down the arm.  Rated pain 9/10 last night and 6/10 today.  She is doing a home exercise program currently focusing on pendulum exercises and active range of motion without any strengthening yet.  On examination today, patient has continued external rotation weakness rated 2/5 with supraspinatus strength rated 4/5 and subscapularis strength rated 5/5.  Axillary nerve is intact with deltoid firing.  There is no bruising or ecchymosis noted on exam throughout the left arm.  There is no discernible motion of the fracture site and the fracture seems to move as a unit with shoulder range of motion.  Left shoulder radiographs taken today demonstrate callus formation around the proximal humerus fracture without any significant change in position of the fracture fragments compared with prior radiographs.  There is no new evidence of any new fracture following her fall yesterday.  However with her increased pain, recommended she just perform pendulum exercises until her pain gets back to the baseline level of pain she was having prior to her fall and then she may return to doing  pendulums and active range of motion exercises.  She will follow-up in 3 weeks for clinical recheck with new radiographs at the time but if she starts to notice increasing pain, bruising, has other concerns then she may reach back out to the office for sooner evaluation.  Patient agreed with plan.  Follow-up in 3 weeks.  Follow-Up Instructions: No follow-ups on file.   Orders:  Orders Placed This Encounter  Procedures   XR Shoulder Left   No orders of the defined types were placed in this encounter.   Imaging: No results found.  PMFS History: Patient Active Problem List   Diagnosis Date Noted   Anemia 01/21/2021   Urticaria 10/21/2020   Rash and nonspecific skin eruption 07/27/2020   Hepatotoxicity due to statin drug 07/15/2020   Vitamin D deficiency 07/12/2020   Chronic low back pain 07/12/2020   BMI 36.0-36.9,adult 12/29/2019   Sepsis without acute organ dysfunction ([place]) 12/29/2019   Post-operative state 12/29/2019   Asthma 12/29/2019   Cervical disc disorder 12/29/2019   Displacement of cervical intervertebral disc 12/29/2019   Abnormal posture 12/29/2019   Brachial radiculitis 12/29/2019   Neck pain 12/29/2019   Pain in upper limb 12/29/2019   Whiplash injury to neck 12/29/2019   Allergy to statin medication 12/01/2019   History of below knee amputation, left ([place]) 07/30/2019   Gangrene of left foot ([place]) 05/16/2019   Charcot's joint of foot, left    PAD (peripheral artery disease) ([place]) 12/05/2018   MRSA bacteremia  12/05/2018   Charcot's joint of foot due to diabetes ([place]) 09/18/2018   Class 2 severe obesity with serious comorbidity and body mass index (BMI) of 37.0 to 37.9 in adult ([place]) 11/19/2017   B12 deficiency 11/01/2017   Diabetic polyneuropathy associated with type 2 diabetes mellitus ([place]) 12/13/2015   Lumbar stenosis with neurogenic claudication 09/02/2015   Chronic constipation 07/13/2015   OSA (obstructive sleep apnea) 06/25/2015   Primary  osteoarthritis involving multiple joints 04/23/2015   Hyperlipidemia 04/23/2015   Statin intolerance 04/23/2015   Asthma, mild intermittent 02/01/2015   Type 2 diabetes mellitus with diabetic nephropathy ([place]) 12/31/2014   Gastroesophageal reflux disease with esophagitis 12/31/2014   Dysphagia 12/31/2014   Bilateral carpal tunnel syndrome 12/01/2014   Diverticulosis of colon 11/30/2012   Benign essential hypertension 11/28/2012   Past Medical History:  Diagnosis Date   Anemia    Arthritis    Asthma    Colon polyps    adenomatous   Diabetes mellitus without complication ([place])    Diabetic infection of left foot ([place]) 12/2018   Diverticulosis of colon    Esophagitis    GERD (gastroesophageal reflux disease)    Hemorrhoid    internal   Hyperlipemia    Hypertension    IBS (irritable bowel syndrome)    no current prob - diet controlled   Myalgia due to statin 11/19/2017   Neuropathy    Neuropathy of both feet    Neuropathy of hand    Pneumonia    x 4   PONV (postoperative nausea and vomiting)    on some surgeries but not all procedures   Skin ulcer of right ankle, limited to breakdown of skin ([place]) 12/31/2016   resolved per patient 05/14/19   Sleep apnea    has had in the past lost 50 pounds and do longer uses cpap   Statin intolerance 04/23/2015   Stroke ([place]) 2009   mini stroke per patient   Stroke-like episode 2009   TIA - mini stroke per patient   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 07/13/2015   Uncontrolled type 2 diabetes mellitus with hyperglycemia ([place]) 11/01/2017    Family History  Problem Relation Age of Onset   Lung cancer Father    Hypertension Father    Arthritis Father    Other Mother        hardening of the arteries/renal cell carcenoma   Hypertension Mother    Stroke Mother    Kidney cancer Mother    Arthritis Brother    Rheum arthritis Maternal Uncle    Bladder Cancer Neg Hx     Past Surgical History:  Procedure Laterality  Date   ABDOMINAL HYSTERECTOMY     AMPUTATION Left 12/06/2018   Procedure: PARTIAL AMPUTATION LEFT FOOT;  Surgeon: Edrick Kins, DPM;  Location: [place];  Service: Podiatry;  Laterality: Left;   AMPUTATION Left 05/16/2019   Procedure: LEFT BELOW KNEE AMPUTATION;  Surgeon: Newt Minion, MD;  Location: [place];  Service: Orthopedics;  Laterality: Left;   BACK SURGERY  09/02/2015   BONE BIOPSY Left 12/06/2018   Procedure: Bone Biopsy;  Surgeon: Edrick Kins, DPM;  Location: [place];  Service: Podiatry;  Laterality: Left;   [place]   COLONOSCOPY N/A 11/30/2012   Procedure: COLONOSCOPY;  Surgeon: Irene Shipper, MD;  [facility];  Service: Endoscopy;  Laterality: N/A;   COLONOSCOPY WITH PROPOFOL N/A 01/03/2016   Procedure:  COLONOSCOPY WITH PROPOFOL;  Surgeon: Manya Silvas, MD;  Location: [facility] ENDOSCOPY;  Service: Endoscopy;  Laterality: N/A;   ESOPHAGOGASTRODUODENOSCOPY (EGD) WITH PROPOFOL N/A 01/14/2015   Procedure: ESOPHAGOGASTRODUODENOSCOPY (EGD) WITH PROPOFOL;  Surgeon: Manya Silvas, MD;  Location[facility];  Service: Endoscopy;  Laterality: N/A;   IRRIGATION AND DEBRIDEMENT FOOT Left 03/13/2018   Procedure: IRRIGATION AND DEBRIDEMENT FOOT WITH BONE BIOPSY WITH MISONIX DEBRIDER;  Surgeon: Evelina Bucy, DPM;  Location: [place];  Service: Podiatry;  Laterality: Left;   IRRIGATION AND DEBRIDEMENT FOOT Left 12/06/2018   Procedure: Irrigation And Debridement Foot;  Surgeon: Edrick Kins, DPM;  Location: [place];  Service: Podiatry;  Laterality: Left;   LEG AMPUTATION BELOW KNEE Left    LUMBAR WOUND DEBRIDEMENT N/A 10/01/2015   Procedure: LUMBAR WOUND DEBRIDEMENT;  Surgeon: Ashok Pall, MD;  Location: [place] NEURO ORS;  Service: Neurosurgery;  Laterality: N/A;  LUMBAR WOUND DEBRIDEMENT   NASAL SINUS SURGERY     SAVORY DILATION N/A 01/14/2015   Procedure: SAVORY DILATION;  Surgeon: Manya Silvas, MD;  Location: [facility] ENDOSCOPY;  Service: Endoscopy;   Laterality: N/A;   TONSILLECTOMY     WISDOM TOOTH EXTRACTION     Social History   Occupational History   Occupation: disabled  Tobacco Use   Smoking status: Never   Smokeless tobacco: Never  Vaping Use   Vaping Use: Never used  Substance and Sexual Activity   Alcohol use: Never   Drug use: No   Sexual activity: Not Currently    Comment: Hysterectomy

## 2021-05-24 ENCOUNTER — Ambulatory Visit: Payer: Medicare Other | Admitting: Podiatry

## 2021-06-07 ENCOUNTER — Ambulatory Visit: Payer: Medicare Other | Admitting: Podiatry

## 2021-06-07 ENCOUNTER — Ambulatory Visit: Payer: Medicare Other

## 2021-06-07 ENCOUNTER — Other Ambulatory Visit: Payer: Self-pay

## 2021-06-07 DIAGNOSIS — M21171 Varus deformity, not elsewhere classified, right ankle: Secondary | ICD-10-CM

## 2021-06-07 DIAGNOSIS — S88112A Complete traumatic amputation at level between knee and ankle, left lower leg, initial encounter: Secondary | ICD-10-CM | POA: Diagnosis not present

## 2021-06-07 DIAGNOSIS — B351 Tinea unguium: Secondary | ICD-10-CM | POA: Diagnosis not present

## 2021-06-07 DIAGNOSIS — E1149 Type 2 diabetes mellitus with other diabetic neurological complication: Secondary | ICD-10-CM

## 2021-06-07 DIAGNOSIS — E119 Type 2 diabetes mellitus without complications: Secondary | ICD-10-CM

## 2021-06-07 DIAGNOSIS — M79674 Pain in right toe(s): Secondary | ICD-10-CM

## 2021-06-07 DIAGNOSIS — M14671 Charcot's joint, right ankle and foot: Secondary | ICD-10-CM

## 2021-06-07 NOTE — Patient Instructions (Signed)

## 2021-06-07 NOTE — Progress Notes (Deleted)
SITUATION Reason for Consult: Evaluation for Prefabricated Diabetic Shoes and Bilateral Custom Diabetic Inserts. Patient / Caregiver Report: Patient needs new diabetic shoes  OBJECTIVE DATA: Patient History / Diagnosis:    ICD-10-CM   1. Type II diabetes mellitus with neurological manifestations ([place])  E11.49     2. Below-knee amputation of left lower extremity ([place])  D78.242P     3. Charcot's joint of foot, right  M14.671       Presence of Diabetic Complications: - Peripheral Neuropathy - Ceripheral Bascular Disease - Cardiovascular Disease - Amputation: Side: Left Level: BK  Current or Previous Devices: ***  In-Person Foot Examination:  Skin presentation:   Thin, Shiny, Hairless Nail presentation:   Thick, Ingrown, With Fungus Ulcers & Callousing:   ***  Toe / Foot Deformities:   - Pes Planus / Cavus - Hindfoot Varus / Valgus - Forefoot ABduction / ADduction  - Hammertoes - Crossover toes - Midfoot collapse - Charcot Deformity   Sensation:    Intact / Compromised (Diminished/Absent ***)  Shoe Size: ***  ORTHOTIC RECOMMENDATION Recommended Devices: - 1x pair prefabricated PDAC approved diabetic shoes: *** - 3x pair custom-to-patient direct CAM carved diabetic insoles.   GOALS OF SHOES AND INSOLES - Reduce shear and pressure - Reduce / Prevent callus formation - Reduce / Prevent ulceration - Protect the fragile healing compromised diabetic foot.  Patient would benefit from diabetic shoes and inserts as patient has diabetes mellitus and the patient has one or more of the following conditions: - History of partial or complete amputation of the foot - History of previous foot ulceration. - History of pre-ulcerative callus - Peripheral neuropathy with evidence of callus formation - Foot deformity - Poor circulation  ACTIONS PERFORMED Patient was casted for insoles via crush box and measured for shoes via brannock device. Procedure was explained and patient  tolerated procedure well. All questions were answered and concerns addressed.  PLAN Insurance to be verified and out of pocket cost communicated to patient. Once cost verified and agreed upon and diabetic certification received, casts are to be sent to [facility] for fabrication. Patient is to be called for fitting when devices are ready.

## 2021-06-07 NOTE — Progress Notes (Signed)
SITUATION Reason for Consult: Evaluation for Prefabricated Diabetic Shoes and Bilateral Custom Diabetic Inserts. Patient / Caregiver Report: Patient wants a black velcro sneaker  OBJECTIVE DATA: Patient History / Diagnosis:    ICD-10-CM   1. Type II diabetes mellitus with neurological manifestations ([place])  E11.49     2. Below-knee amputation of left lower extremity ([place])  Y19.509T     3. Charcot's joint of foot, right  M14.671       Presence of Diabetic Complications: - Peripheral Neuropathy - Amputation: Side: Left Level: BK  Current or Previous Devices: Diabetic shoes, right [place] brace  In-Person Foot Examination:  Skin presentation:   Thin, Shiny, Hairless Nail presentation:   Thick, Ingrown, With Fungus Ulcers & Callousing:   Historical ulcer left foot  Toe / Foot Deformities:   - Pes Cavus - Hindfoot Varus - Forefoot ADduction  - Hammertoes - Charcot Deformity   Shoe Size: 8XW  ORTHOTIC RECOMMENDATION Recommended Devices: - 1x pair prefabricated PDAC approved diabetic shoes: [id number]  GOALS OF SHOES AND INSOLES - Reduce shear and pressure - Reduce / Prevent callus formation - Reduce / Prevent ulceration - Protect the fragile healing compromised diabetic foot.  Patient would benefit from diabetic shoes and inserts as patient has diabetes mellitus and the patient has one or more of the following conditions: - History of partial or complete amputation of the foot - History of previous foot ulceration. - History of pre-ulcerative callus - Peripheral neuropathy with evidence of callus formation - Foot deformity - Poor circulation  ACTIONS PERFORMED Patient was casted for insoles via crush box and measured for shoes via brannock device. Procedure was explained and patient tolerated procedure well. All questions were answered and concerns addressed.  PLAN Insurance to be verified and out of pocket cost communicated to patient. Once cost verified and agreed  upon and diabetic certification received, casts are to be sent to [facility] for fabrication. Patient is to be called for fitting when devices are ready.

## 2021-06-10 NOTE — Progress Notes (Signed)
Subjective: 66 y.o. returns the office today for painful, elongated, thickened toenails which she cannot trim herself.  Also presents today for diabetic foot exam.  She has not noticed any open sores.  She states that time she does not wear the [place] brace on her right foot she gets some discomfort the arch of her foot.  No injuries or falls that affected her right side.  She did have a fall as the screw came out of her prosthetic on the left leg procedure in her shoulder which she has been getting over.   PCP: Delsa Grana, PA-C  Objective: AAO 3, NAD DP/PT pulses palpable, CRT less than 3 seconds Left below-knee amputation Sensation absent with [place] 1 to monofilament. Nails hypertrophic, dystrophic, elongated, brittle, discolored 5. There is tenderness overlying the nails 1-5 on the right. There is no surrounding erythema or drainage along the nail sites. Prominence of the fourth metatarsal head plantarly without any hyperkeratotic tissue.  There is no open lesions identified. Cavovarus foot type on the right side. No pain with calf compression, swelling, warmth, erythema.  Assessment: Patient presents with symptomatic onychomycosis; diabetic foot exam, Charcot right foot  Plan: -Treatment options including alternatives, risks, complications were discussed -Nails sharply debrided 5 without complication/bleeding. -Continue to monitor right foot daily.  Moisturizer daily. -Recommend continue with [place].  Discussed directions for size incision continues well at home.  I do think she needs new diabetic shoes as well and she was seen by our orthotist, Aaron Edelman for this. -Discussed daily foot inspection and glucose control. -Follow-up as scheduled or sooner if any problems are to arise. In the meantime, encouraged to call the office with any questions, concerns, changes symptoms.  Celesta Gentile, DPM

## 2021-06-13 ENCOUNTER — Ambulatory Visit: Payer: Medicare Other | Admitting: Orthopedic Surgery

## 2021-06-21 LAB — HEMOGLOBIN A1C: Hemoglobin A1C: 8.7

## 2021-06-30 ENCOUNTER — Ambulatory Visit (INDEPENDENT_AMBULATORY_CARE_PROVIDER_SITE_OTHER): Payer: Medicare Other | Admitting: Internal Medicine

## 2021-06-30 ENCOUNTER — Encounter: Payer: Self-pay | Admitting: Internal Medicine

## 2021-06-30 VITALS — BP 154/68 | HR 94 | Temp 97.8°F | Resp 18 | Ht 62.0 in | Wt 187.0 lb

## 2021-06-30 DIAGNOSIS — E11610 Type 2 diabetes mellitus with diabetic neuropathic arthropathy: Secondary | ICD-10-CM | POA: Diagnosis not present

## 2021-06-30 DIAGNOSIS — K719 Toxic liver disease, unspecified: Secondary | ICD-10-CM | POA: Diagnosis not present

## 2021-06-30 DIAGNOSIS — E1121 Type 2 diabetes mellitus with diabetic nephropathy: Secondary | ICD-10-CM | POA: Diagnosis not present

## 2021-06-30 DIAGNOSIS — Z89512 Acquired absence of left leg below knee: Secondary | ICD-10-CM

## 2021-06-30 DIAGNOSIS — Z794 Long term (current) use of insulin: Secondary | ICD-10-CM

## 2021-06-30 NOTE — Progress Notes (Signed)
Established Patient Office Visit  Subjective:  Patient ID: Olivia Romero, female    DOB: [birth year]  Age: 66 y.o. MRN: [id number]  CC:  Chief Complaint  Patient presents with   Follow-up   Diabetes    Rx for dm shoes     HPI Olivia Romero presents for forms for diabetic shoes.  Diabetes, Type 2: -Follows with Endocrinology, been a diabetic since 2004 -Last A1c 8.7% 1/23 -Medications: Restarted on Tresiba 20 units, Metformin 500 TID, Glimepiride 2 mg BID -Patient is compliant with the above medications and reports no side effects.  -Checking BG at home: fasting - 97-135. No hypoglycemic events in the last few years -Eye exam: 6 months ago, next appointment in March -Foot exam: Following with Podiatrist, will see them again in March. Had one bone removed in right foot after multiple issues with pressure ulcers. Also has history of Charcot's bilaterally. Surgery was in 2019. Since surgery needs to wear foot brace, which does not fit into normal tennis shoes.  -Microalbumin: 8/22 -Statin: No, history of intolerance due to liver toxicity.  -PNA vaccine: up to date in 8/67 -Complications: L BKA 5449, poor wound healing, neuropathy in feet and hands. History of osteomyelitis in left lower extremity.  -Denies symptoms of hypoglycemia, polyuria, polydipsia, numbness extremities, foot ulcers/trauma.  -Does have diabetic neuropathy, on Gabapentin and Cymbalta.   Past Medical History:  Diagnosis Date   Anemia    Arthritis    Asthma    Colon polyps    adenomatous   Diabetes mellitus without complication ([place])    Diabetic infection of left foot ([place]) 12/2018   Diverticulosis of colon    Esophagitis    GERD (gastroesophageal reflux disease)    Hemorrhoid    internal   Hyperlipemia    Hypertension    IBS (irritable bowel syndrome)    no current prob - diet controlled   Myalgia due to statin 11/19/2017   Neuropathy    Neuropathy of both feet    Neuropathy of hand     Pneumonia    x 4   PONV (postoperative nausea and vomiting)    on some surgeries but not all procedures   Skin ulcer of right ankle, limited to breakdown of skin ([place]) 12/31/2016   resolved per patient 05/14/19   Sleep apnea    has had in the past lost 50 pounds and do longer uses cpap   Statin intolerance 04/23/2015   Stroke ([place]) 2009   mini stroke per patient   Stroke-like episode 2009   TIA - mini stroke per patient   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 07/13/2015   Uncontrolled type 2 diabetes mellitus with hyperglycemia ([place]) 11/01/2017    Past Surgical History:  Procedure Laterality Date   ABDOMINAL HYSTERECTOMY     AMPUTATION Left 12/06/2018   Procedure: PARTIAL AMPUTATION LEFT FOOT;  Surgeon: Edrick Kins, DPM;  Location: [place];  Service: Podiatry;  Laterality: Left;   AMPUTATION Left 05/16/2019   Procedure: LEFT BELOW KNEE AMPUTATION;  Surgeon: Newt Minion, MD;  Location: [place];  Service: Orthopedics;  Laterality: Left;   BACK SURGERY  09/02/2015   BONE BIOPSY Left 12/06/2018   Procedure: Bone Biopsy;  Surgeon: Edrick Kins, DPM;  Location: [place];  Service: Podiatry;  Laterality: Left;   [place]   COLONOSCOPY N/A 11/30/2012   Procedure: COLONOSCOPY;  Surgeon: Irene Shipper, MD;  Location: [facility] ENDOSCOPY;  Service: Endoscopy;  Laterality: N/A;   COLONOSCOPY WITH PROPOFOL N/A 01/03/2016   Procedure: COLONOSCOPY WITH PROPOFOL;  Surgeon: Manya Silvas, MD;  Location[facility] ENDOSCOPY;  Service: Endoscopy;  Laterality: N/A;   ESOPHAGOGASTRODUODENOSCOPY (EGD) WITH PROPOFOL N/A 01/14/2015   Procedure: ESOPHAGOGASTRODUODENOSCOPY (EGD) WITH PROPOFOL;  Surgeon: Manya Silvas, MD;  Location: [facility] ENDOSCOPY;  Service: Endoscopy;  Laterality: N/A;   IRRIGATION AND DEBRIDEMENT FOOT Left 03/13/2018   Procedure: IRRIGATION AND DEBRIDEMENT FOOT WITH BONE BIOPSY WITH MISONIX DEBRIDER;  Surgeon: Evelina Bucy, DPM;  Location: [place];   Service: Podiatry;  Laterality: Left;   IRRIGATION AND DEBRIDEMENT FOOT Left 12/06/2018   Procedure: Irrigation And Debridement Foot;  Surgeon: Edrick Kins, DPM;  Location: [place];  Service: Podiatry;  Laterality: Left;   LEG AMPUTATION BELOW KNEE Left    LUMBAR WOUND DEBRIDEMENT N/A 10/01/2015   Procedure: LUMBAR WOUND DEBRIDEMENT;  Surgeon: Ashok Pall, MD;  Location: [place] NEURO ORS;  Service: Neurosurgery;  Laterality: N/A;  LUMBAR WOUND DEBRIDEMENT   NASAL SINUS SURGERY     SAVORY DILATION N/A 01/14/2015   Procedure: SAVORY DILATION;  Surgeon: Manya Silvas, MD;  Location: [facility] ENDOSCOPY;  Service: Endoscopy;  Laterality: N/A;   TONSILLECTOMY     WISDOM TOOTH EXTRACTION      Family History  Problem Relation Age of Onset   Lung cancer Father    Hypertension Father    Arthritis Father    Other Mother        hardening of the arteries/renal cell carcenoma   Hypertension Mother    Stroke Mother    Kidney cancer Mother    Arthritis Brother    Rheum arthritis Maternal Uncle    Bladder Cancer Neg Hx     Social History   Socioeconomic History   Marital status: Single    Spouse name: Not on file   Number of children: 1   Years of education: Not on file   Highest education level: Not on file  Occupational History   Occupation: disabled  Tobacco Use   Smoking status: Never   Smokeless tobacco: Never  Vaping Use   Vaping Use: Never used  Substance and Sexual Activity   Alcohol use: Never   Drug use: No   Sexual activity: Not Currently    Comment: Hysterectomy  Other Topics Concern   Not on file  Social History Narrative   Not on file   Social Determinants of Health   Financial Resource Strain: Low Risk    Difficulty of Paying Living Expenses: Not very hard  Food Insecurity: No Food Insecurity   Worried About Running Out of Food in the Last Year: Never true   Ran Out of Food in the Last Year: Never true  Transportation Needs: No Transportation Needs   Lack of  Transportation (Medical): No   Lack of Transportation (Non-Medical): No  Physical Activity: Insufficiently Active   Days of Exercise per Week: 7 days   Minutes of Exercise per Session: 20 min  Stress: No Stress Concern Present   Feeling of Stress : Not at all  Social Connections: Unknown   Frequency of Communication with Friends and Family: More than three times a week   Frequency of Social Gatherings with Friends and Family: Once a week   Attends Religious Services: 1 to 4 times per year   Active Member of [employer] or Organizations: Yes   Attends Archivist Meetings: More than 4 times per  year   Marital Status: Not on file  Intimate Partner Violence: Not At Risk   Fear of Current or Ex-Partner: No   Emotionally Abused: No   Physically Abused: No   Sexually Abused: No    Outpatient Medications Prior to Visit  Medication Sig Dispense Refill   acetaminophen (TYLENOL) 500 MG tablet Take 1,000 mg by mouth every 6 (six) hours as needed for mild pain. Maximum of 3,000 mg per day     albuterol (VENTOLIN HFA) 108 (90 Base) MCG/ACT inhaler INHALE 2 PUFFS INTO THE LUNGS EVERY 4 HOURS AS NEEDED 6.7 g 0   allopurinol (ZYLOPRIM) 100 MG tablet Take 1 tablet (100 mg total) by mouth 2 (two) times daily. 60 tablet 3   aspirin EC 325 MG tablet Take 1 tablet (325 mg total) by mouth daily. 30 tablet 0   benzonatate (TESSALON) 100 MG capsule Take 1 capsule (100 mg total) by mouth 2 (two) times daily as needed for cough. 20 capsule 0   Blood Glucose Monitoring Suppl (CONTOUR NEXT MONITOR) w/Device KIT as directed.     cetirizine (ZYRTEC ALLERGY) 10 MG tablet Take 1 tablet (10 mg total) by mouth daily. 90 tablet 1   Colchicine 0.6 MG CAPS Take 0.6 mg by mouth 2 (two) times daily as needed. 60 capsule 1   doxycycline (VIBRA-TABS) 100 MG tablet Take 1 tablet (100 mg total) by mouth 2 (two) times daily. 20 tablet 0   DULoxetine (CYMBALTA) 60 MG capsule Take 120 mg by mouth daily.     ezetimibe (ZETIA)  10 MG tablet Take 1 tablet (10 mg total) by mouth daily. 90 tablet 3   fenofibrate (TRICOR) 145 MG tablet TAKE 1 TABLET BY MOUTH ONCE DAILY 90 tablet 1   fluticasone (FLONASE) 50 MCG/ACT nasal spray Place 2 sprays into both nostrils daily. 16 g 6   gabapentin (NEURONTIN) 100 MG capsule Take by mouth.     gabapentin (NEURONTIN) 400 MG capsule Take 400 mg by mouth 4 (four) times daily.     glimepiride (AMARYL) 2 MG tablet Take 2 mg by mouth 2 (two) times daily.      hydrochlorothiazide (MICROZIDE) 12.5 MG capsule Take 1 capsule (12.5 mg total) by mouth daily. 90 capsule 3   HYDROcodone-acetaminophen (NORCO/VICODIN) 5-325 MG tablet Take 2 tablets by mouth every 4 (four) hours as needed. 10 tablet 0   HYDROcodone-acetaminophen (NORCO/VICODIN) 5-325 MG tablet Take 1 tablet by mouth every 6 (six) hours as needed for moderate pain. 30 tablet 0   Incontinence Supply Disposable (ASSURANCE FITTED BRIEF LARGE) MISC Use 1 brief up to 6 times a day as needed for urinary incontinence 180 each 5   insulin degludec (TRESIBA) 200 UNIT/ML FlexTouch Pen Inject 20 Units into the skin at bedtime.      metFORMIN (GLUCOPHAGE-XR) 500 MG 24 hr tablet Take 500 mg by mouth 3 (three) times daily.      pantoprazole (PROTONIX) 40 MG tablet Take 1 tablet (40 mg total) by mouth daily as needed (Heartburn). 90 tablet 2   tiZANidine (ZANAFLEX) 4 MG tablet TAKE ONE HALF(2MG) TO ONE AND A HALF TABLETS (6MG) BY MOUTH EVERY 8 HOURS AS NEEDED FOR MUSCLE SPASMS/TIGHTNESS 90 tablet 1   triamcinolone (KENALOG) 0.1 % Apply 1 application topically 2 (two) times daily. 30 g 0   ULTICARE SHORT PEN NEEDLES 31G X 8 MM MISC      No facility-administered medications prior to visit.    Allergies  Allergen Reactions   Eggs  Or Egg-Derived Products Swelling and Other (See Comments)    Angioedema   Atorvastatin Other (See Comments)    Liver toxicity   Influenza Virus Vaccine Swelling    Arm swelled (site of injection)   Latex Itching    Pravastatin Itching and Rash   Tape Rash and Other (See Comments)    Adhesive on foot pull off skin.  Ok to use paper tape.    ROS Review of Systems  Constitutional:  Negative for chills and fever.  Eyes:  Negative for visual disturbance.  Respiratory:  Negative for shortness of breath.   Cardiovascular:  Negative for chest pain.  Neurological:  Negative for headaches.     Objective:    Physical Exam Constitutional:      Appearance: Normal appearance.  HENT:     Head: Normocephalic and atraumatic.  Eyes:     Conjunctiva/sclera: Conjunctivae normal.  Cardiovascular:     Rate and Rhythm: Normal rate and regular rhythm.  Pulmonary:     Effort: Pulmonary effort is normal.     Breath sounds: Normal breath sounds.  Musculoskeletal:     Comments: Wearing brace on right lower extremity, s/p L BKA  Skin:    General: Skin is warm and dry.  Neurological:     General: No focal deficit present.     Mental Status: She is alert. Mental status is at baseline.  Psychiatric:        Mood and Affect: Mood normal.        Behavior: Behavior normal.    BP (!) 154/68    Pulse 94    Temp 97.8 F (36.6 C)    Resp 18    Ht 5' 2"  (1.575 m)    Wt 187 lb (84.8 kg)    LMP  (LMP Unknown)    SpO2 99%    BMI 34.20 kg/m  Wt Readings from Last 3 Encounters:  02/28/21 181 lb (82.1 kg)  01/21/21 180 lb 12.8 oz (82 kg)  12/22/20 185 lb (83.9 kg)     Health Maintenance Due  Topic Date Due   Zoster Vaccines- Shingrix (1 of 2) Never done    There are no preventive care reminders to display for this patient.  Lab Results  Component Value Date   TSH 0.70 01/31/2017   Lab Results  Component Value Date   WBC 9.1 01/21/2021   HGB 11.6 (L) 01/21/2021   HCT 37.7 01/21/2021   MCV 80.6 01/21/2021   PLT 330 01/21/2021   Lab Results  Component Value Date   NA 140 01/21/2021   K 4.9 01/21/2021   CO2 28 01/21/2021   GLUCOSE 94 01/21/2021   BUN 18 01/21/2021   CREATININE 0.75 01/21/2021    BILITOT 0.3 01/21/2021   ALKPHOS 68 12/04/2018   AST 14 01/21/2021   ALT 13 01/21/2021   PROT 7.1 01/21/2021   ALBUMIN 3.7 12/04/2018   CALCIUM 9.5 01/21/2021   ANIONGAP 12 05/16/2019   EGFR 89 01/21/2021   Lab Results  Component Value Date   CHOL 194 01/21/2021   Lab Results  Component Value Date   HDL 44 (L) 01/21/2021   Lab Results  Component Value Date   LDLCALC 114 (H) 01/21/2021   Lab Results  Component Value Date   TRIG 255 (H) 01/21/2021   Lab Results  Component Value Date   CHOLHDL 4.4 01/21/2021   Lab Results  Component Value Date   HGBA1C 7.2 (H) 01/21/2021  Assessment & Plan:   1. Type 2 diabetes mellitus with diabetic nephropathy, with long-term current use of insulin (HCC)/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)/History of below knee amputation, left [facility]): Diabetes currently uncontrolled, recently saw Endocrinology with the addition of Tresiba. Following with foot and ankle specialist, due to neuropathy, Charcot's bilaterally and BKA on the left as well as poor wound healing and multiple surgeries on the right lower extremity, it is appropriate for her to have diabetic shoes that can the can wear along with her brace at the same time. Forms signed and faxed.   2. Drug-induced injury of liver: Documented liver toxicity following statin use in the past.    Follow-up: Return for keep follow up in February .    Teodora Medici, DO

## 2021-06-30 NOTE — Patient Instructions (Addendum)
It was great seeing you today!  Plan discussed at today's visit: -Paper signed for diabetic shoes  -Continue medications and keep follow ups  Follow up in: in 07/25/21   Take care and let us know if you have any questions or concerns prior to your next visit.  Dr. Rosana Berger

## 2021-07-06 ENCOUNTER — Telehealth: Payer: Self-pay | Admitting: Podiatry

## 2021-07-06 NOTE — Telephone Encounter (Signed)
Pt left message wanting to make sure the right shoe got ordered, she wanted the black shoe.  I returned call and the shoe that was ordered was black. I also told pt we just got the paperwork and they have been ordered and I will call when they come in.

## 2021-07-13 ENCOUNTER — Ambulatory Visit (INDEPENDENT_AMBULATORY_CARE_PROVIDER_SITE_OTHER): Payer: Medicare Other | Admitting: Orthopedic Surgery

## 2021-07-13 ENCOUNTER — Other Ambulatory Visit: Payer: Self-pay

## 2021-07-13 ENCOUNTER — Encounter: Payer: Self-pay | Admitting: Orthopedic Surgery

## 2021-07-13 ENCOUNTER — Ambulatory Visit: Payer: Self-pay

## 2021-07-13 DIAGNOSIS — S4292XA Fracture of left shoulder girdle, part unspecified, initial encounter for closed fracture: Secondary | ICD-10-CM

## 2021-07-13 NOTE — Progress Notes (Signed)
Office Visit Note   Patient: Olivia Romero           Date of Birth: [birth year]           MRN: [id number] Visit Date: 07/13/2021 Requested by: Delsa Grana, PA-C [address] [place],  [place] [address] PCP: Delsa Grana, PA-C  Subjective: Chief Complaint  Patient presents with   Left Shoulder - Follow-up    HPI: Olivia Romero is a 66 year old patient who had left proximal humerus fracture 02/28/2021.  She has been doing her own physical therapy.  She has been making some progress.  Axillary nerve function initially was out but now it has returned.  She is able to almost reach to the right shoulder.  She is starting to get around behind her back as well.  She does not have pain all the time now but just some of the time.              ROS: All systems reviewed are negative as they relate to the chief complaint within the history of present illness.  Patient denies  fevers or chills.   Assessment & Plan: Visit Diagnoses:  1. Shoulder fracture, left, closed, initial encounter     Plan: Impression is improvement in movement following left proximal humerus fracture.  Reverse shoulder replacement always an option if the plateaued range of motion is not acceptable or the pain becomes severe.  For now she should continue to work on range of motion exercises and using the shoulder is much as possible.  This will likely plateau around summertime.  Follow-up as needed if she desires further intervention.  Follow-Up Instructions: Return if symptoms worsen or fail to improve.   Orders:  Orders Placed This Encounter  Procedures   XR Shoulder Left   No orders of the defined types were placed in this encounter.     Procedures: No procedures performed   Clinical Data: No additional findings.  Objective: Vital Signs: LMP  (LMP Unknown)   Physical Exam:   Constitutional: Patient appears well-developed HEENT:  Head: Normocephalic Eyes:EOM are normal Neck: Normal range of  motion Cardiovascular: Normal rate Pulmonary/chest: Effort normal Neurologic: Patient is alert Skin: Skin is warm Psychiatric: Patient has normal mood and affect   Ortho Exam: Ortho exam demonstrates external rotation 10 passive abduction 50 forward flexion 75 on the left.  Axillary nerve dysfunction.  Motor or sensory function of the hand is intact.  Specialty Comments:  No specialty comments available.  Imaging: XR Shoulder Left  Result Date: 07/13/2021 Multiple views left shoulder reviewed.  Proximal humerus fracture has healed with some humeral head malunion.  Shoulder remains located.  No acute fracture or lung field issues.    PMFS History: Patient Active Problem List   Diagnosis Date Noted   Anemia 01/21/2021   Urticaria 10/21/2020   Rash and nonspecific skin eruption 07/27/2020   Hepatotoxicity due to statin drug 07/15/2020   Vitamin D deficiency 07/12/2020   Chronic low back pain 07/12/2020   BMI 36.0-36.9,adult 12/29/2019   Sepsis without acute organ dysfunction ([place]) 12/29/2019   Post-operative state 12/29/2019   Asthma 12/29/2019   Cervical disc disorder 12/29/2019   Displacement of cervical intervertebral disc 12/29/2019   Abnormal posture 12/29/2019   Brachial radiculitis 12/29/2019   Neck pain 12/29/2019   Pain in upper limb 12/29/2019   Whiplash injury to neck 12/29/2019   Allergy to statin medication 12/01/2019   History of below knee amputation, left ([place]) 07/30/2019  Gangrene of left foot ([place]) 05/16/2019   Charcot's joint of foot, left    PAD (peripheral artery disease) ([place]) 12/05/2018   MRSA bacteremia 12/05/2018   Charcot's joint of foot due to diabetes ([place]) 09/18/2018   Class 2 severe obesity with serious comorbidity and body mass index (BMI) of 37.0 to 37.9 in adult ([place]) 11/19/2017   B12 deficiency 11/01/2017   Diabetic polyneuropathy associated with type 2 diabetes mellitus ([place]) 12/13/2015   Lumbar stenosis with neurogenic claudication  09/02/2015   Chronic constipation 07/13/2015   OSA (obstructive sleep apnea) 06/25/2015   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/23/2015   Hyperlipidemia 04/23/2015   Statin intolerance 04/23/2015   Asthma, mild intermittent 02/01/2015   Type 2 diabetes mellitus with diabetic nephropathy ([place]) 12/31/2014   Gastroesophageal reflux disease with esophagitis 12/31/2014   Dysphagia 12/31/2014   Bilateral carpal tunnel syndrome 12/01/2014   Diverticulosis of colon 11/30/2012   Benign essential hypertension 11/28/2012   Past Medical History:  Diagnosis Date   Anemia    Arthritis    Asthma    Colon polyps    adenomatous   Diabetes mellitus without complication ([place])    Diabetic infection of left foot ([place]) 12/2018   Diverticulosis of colon    Esophagitis    GERD (gastroesophageal reflux disease)    Hemorrhoid    internal   Hyperlipemia    Hypertension    IBS (irritable bowel syndrome)    no current prob - diet controlled   Myalgia due to statin 11/19/2017   Neuropathy    Neuropathy of both feet    Neuropathy of hand    Pneumonia    x 4   PONV (postoperative nausea and vomiting)    on some surgeries but not all procedures   Skin ulcer of right ankle, limited to breakdown of skin ([place]) 12/31/2016   resolved per patient 05/14/19   Sleep apnea    has had in the past lost 50 pounds and do longer uses cpap   Statin intolerance 04/23/2015   Stroke ([place]) 2009   mini stroke per patient   Stroke-like episode 2009   TIA - mini stroke per patient   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 07/13/2015   Uncontrolled type 2 diabetes mellitus with hyperglycemia ([place]) 11/01/2017    Family History  Problem Relation Age of Onset   Lung cancer Father    Hypertension Father    Arthritis Father    Other Mother        hardening of the arteries/renal cell carcenoma   Hypertension Mother    Stroke Mother    Kidney cancer Mother    Arthritis Brother    Rheum  arthritis Maternal Uncle    Bladder Cancer Neg Hx     Past Surgical History:  Procedure Laterality Date   ABDOMINAL HYSTERECTOMY     AMPUTATION Left 12/06/2018   Procedure: PARTIAL AMPUTATION LEFT FOOT;  Surgeon: Edrick Kins, DPM;  Location: [facility];  Service: Podiatry;  Laterality: Left;   AMPUTATION Left 05/16/2019   Procedure: LEFT BELOW KNEE AMPUTATION;  Surgeon: Newt Minion, MD;  Location: [facility];  Service: Orthopedics;  Laterality: Left;   BACK SURGERY  09/02/2015   BONE BIOPSY Left 12/06/2018   Procedure: Bone Biopsy;  Surgeon: Edrick Kins, DPM;  Location: [facility];  Service: Podiatry;  Laterality: Left;   [place]   COLONOSCOPY N/A 11/30/2012   Procedure: COLONOSCOPY;  Surgeon: Irene Shipper, MD;  Location: [facility];  Service: Endoscopy;  Laterality: N/A;   COLONOSCOPY WITH PROPOFOL N/A 01/03/2016   Procedure: COLONOSCOPY WITH PROPOFOL;  Surgeon: Manya Silvas, MD;  [facility];  Service: Endoscopy;  Laterality: N/A;   ESOPHAGOGASTRODUODENOSCOPY (EGD) WITH PROPOFOL N/A 01/14/2015   Procedure: ESOPHAGOGASTRODUODENOSCOPY (EGD) WITH PROPOFOL;  Surgeon: Manya Silvas, MD;  Location: [facility] ENDOSCOPY;  Service: Endoscopy;  Laterality: N/A;   IRRIGATION AND DEBRIDEMENT FOOT Left 03/13/2018   Procedure: IRRIGATION AND DEBRIDEMENT FOOT WITH BONE BIOPSY WITH MISONIX DEBRIDER;  Surgeon: Evelina Bucy, DPM;  Location: [facility];  Service: Podiatry;  Laterality: Left;   IRRIGATION AND DEBRIDEMENT FOOT Left 12/06/2018   Procedure: Irrigation And Debridement Foot;  Surgeon: Edrick Kins, DPM;  Location: [facility];  Service: Podiatry;  Laterality: Left;   LEG AMPUTATION BELOW KNEE Left    LUMBAR WOUND DEBRIDEMENT N/A 10/01/2015   Procedure: LUMBAR WOUND DEBRIDEMENT;  Surgeon: Ashok Pall, MD;  Location: [facility]O [facility];  Service: Neurosurgery;  Laterality: N/A;  LUMBAR WOUND DEBRIDEMENT   NASAL SINUS SURGERY     SAVORY DILATION N/A 01/14/2015    Procedure: SAVORY DILATION;  Surgeon: Manya Silvas, MD;  Location: [facility] ENDOSCOPY;  Service: Endoscopy;  Laterality: N/A;   TONSILLECTOMY     WISDOM TOOTH EXTRACTION     Social History   Occupational History   Occupation: disabled  Tobacco Use   Smoking status: Never   Smokeless tobacco: Never  Vaping Use   Vaping Use: Never used  Substance and Sexual Activity   Alcohol use: Never   Drug use: No   Sexual activity: Not Currently    Comment: Hysterectomy

## 2021-07-18 ENCOUNTER — Ambulatory Visit: Payer: Medicare Other

## 2021-07-18 ENCOUNTER — Other Ambulatory Visit: Payer: Self-pay

## 2021-07-18 DIAGNOSIS — M14671 Charcot's joint, right ankle and foot: Secondary | ICD-10-CM

## 2021-07-18 DIAGNOSIS — E1149 Type 2 diabetes mellitus with other diabetic neurological complication: Secondary | ICD-10-CM

## 2021-07-18 DIAGNOSIS — S88112A Complete traumatic amputation at level between knee and ankle, left lower leg, initial encounter: Secondary | ICD-10-CM

## 2021-07-18 NOTE — Progress Notes (Signed)
SITUATION Reason for Visit: Fitting of Diabetic Shoes Patient / Caregiver Report:  Patient forgot to bring brace and is concerned it will not work with brace  OBJECTIVE DATA: Patient History / Diagnosis:     ICD-10-CM   1. Type II diabetes mellitus with neurological manifestations ([place])  E11.49     2. Below-knee amputation of left lower extremity ([place])  Z73.567O     3. Charcot's joint of foot, right  M14.671       Change in Status:   None  ACTIONS PERFORMED: In-Person Delivery, patient was fit with: - 1x pair A5500 PDAC approved prefabricated Diabetic Shoes: Apex double strap velcro sneaker black  Shoes were verified for structural integrity and safety. Patient wore shoes in office. Skin was inspected and free of areas of concern after wearing shoes. Shoes fit properly. Patient / Caregiver provided with ferbal instruction and demonstration regarding donning, doffing, wear, care, proper fit, function, purpose, cleaning, and use of shoes and in all related precautions and risks and benefits regarding shoes and insoles. Patient / Caregiver was instructed to wear properly fitting socks with shoes at all times. Patient was also provided with verbal instruction regarding how to report any failures or malfunctions of shoes, and necessary follow up care. Patient / Caregiver was also instructed to contact physician regarding change in status that may affect function of shoes.   Patient / Caregiver verbalized undersatnding of instruction provided. Patient / Caregiver demonstrated independence with proper donning and doffing of shoes and inserts.  PLAN Patient to follow up as needed. Plan of care was discussed with and agreed upon by patient and/or caregiver. All questions were answered and concerns addressed.

## 2021-07-22 ENCOUNTER — Other Ambulatory Visit: Payer: Self-pay

## 2021-07-22 ENCOUNTER — Ambulatory Visit: Payer: Medicare Other

## 2021-07-22 DIAGNOSIS — S88112A Complete traumatic amputation at level between knee and ankle, left lower leg, initial encounter: Secondary | ICD-10-CM

## 2021-07-22 DIAGNOSIS — E1149 Type 2 diabetes mellitus with other diabetic neurological complication: Secondary | ICD-10-CM

## 2021-07-22 DIAGNOSIS — M14671 Charcot's joint, right ankle and foot: Secondary | ICD-10-CM

## 2021-07-22 NOTE — Progress Notes (Signed)
SITUATION Reason for Consult: Follow-up with diabetic shoes Patient / Caregiver Report: Patient needs to make sure she can use her shoes and her brace  OBJECTIVE DATA History / Diagnosis:    ICD-10-CM   1. Type II diabetes mellitus with neurological manifestations ([place])  E11.49     2. Below-knee amputation of left lower extremity ([place])  Q33.354T     3. Charcot's joint of foot, right  M14.671       Change in Pathology: None  ACTIONS PERFORMED Patient's equipment was checked for structural stability and fit. Removed bottom strap from AFO and adjusted thickness of insole in shoe. Patient was able to successfully don and doff independently. Device(s) intact and fit is excellent. All questions answered and concerns addressed.  PLAN Follow-up as needed (PRN). Plan of care discussed with and agreed upon by patient / caregiver.

## 2021-07-25 ENCOUNTER — Ambulatory Visit (INDEPENDENT_AMBULATORY_CARE_PROVIDER_SITE_OTHER): Payer: Medicare Other | Admitting: Internal Medicine

## 2021-07-25 VITALS — HR 107 | Ht 62.0 in | Wt 187.0 lb

## 2021-07-25 DIAGNOSIS — K2100 Gastro-esophageal reflux disease with esophagitis, without bleeding: Secondary | ICD-10-CM | POA: Diagnosis not present

## 2021-07-25 DIAGNOSIS — K719 Toxic liver disease, unspecified: Secondary | ICD-10-CM | POA: Diagnosis not present

## 2021-07-25 DIAGNOSIS — T466X5A Adverse effect of antihyperlipidemic and antiarteriosclerotic drugs, initial encounter: Secondary | ICD-10-CM

## 2021-07-25 DIAGNOSIS — E1140 Type 2 diabetes mellitus with diabetic neuropathy, unspecified: Secondary | ICD-10-CM

## 2021-07-25 DIAGNOSIS — I10 Essential (primary) hypertension: Secondary | ICD-10-CM | POA: Diagnosis not present

## 2021-07-25 DIAGNOSIS — E11610 Type 2 diabetes mellitus with diabetic neuropathic arthropathy: Secondary | ICD-10-CM

## 2021-07-25 DIAGNOSIS — Z794 Long term (current) use of insulin: Secondary | ICD-10-CM

## 2021-07-25 DIAGNOSIS — J4520 Mild intermittent asthma, uncomplicated: Secondary | ICD-10-CM

## 2021-07-25 DIAGNOSIS — E785 Hyperlipidemia, unspecified: Secondary | ICD-10-CM

## 2021-07-25 NOTE — Assessment & Plan Note (Signed)
Reviewed lipid panel from January, still uncontrolled. Has statin intolerance, currently on fish oil, Fenofibrate and Zetia.

## 2021-07-25 NOTE — Assessment & Plan Note (Signed)
Following with Endo.

## 2021-07-25 NOTE — Patient Instructions (Signed)
It was great seeing you today!  Plan discussed at today's visit: -Blood work ordered today, results will be uploaded to MyChart.   Follow up in:  Take care and let us know if you have any questions or concerns prior to your next visit.  Dr. Dieter Hane  

## 2021-07-25 NOTE — Assessment & Plan Note (Signed)
Following with Endo and Podiatry, got new diabetic shoes.

## 2021-07-25 NOTE — Assessment & Plan Note (Signed)
Statin intolerance 

## 2021-07-25 NOTE — Assessment & Plan Note (Signed)
Stable, BP at goal at home. Continue HCTZ and follow up in 6 months for recheck.

## 2021-07-25 NOTE — Assessment & Plan Note (Signed)
Stable, continue Albuterol PRN.

## 2021-07-25 NOTE — Progress Notes (Signed)
Virtual Visit via Telephone Note  I connected with Olivia Romero on 07/25/21 at 11:20 AM EST by telephone and verified that I am speaking with the correct person using two identifiers.  Location: Patient: Home Provider: [facility]   I discussed the limitations, risks, security and privacy concerns of performing an evaluation and management service by telephone and the availability of in person appointments. I also discussed with the patient that there may be a patient responsible charge related to this service. The patient expressed understanding and agreed to proceed.   History of Present Illness:  Olivia Romero is a 66 year old female presenting over the phone for follow up on chronic medical conditions.   Hypertension: -Medications: HCTZ 12.5  -Patient is compliant with above medications and reports no side effects. -Checking BP at home (average): not regularly, 128/65-70 -Denies any SOB, CP, vision changes, LE edema or symptoms of hypotension  HLD: -Medications: Zetia 10, Fenofibrate 145, and fish oil BID, statin intolerance  -Patient is compliant with above medications and reports no side effects.  -Last lipid panel: 1/23: TC 265, Triglycerides 485, HDL 43.9, LDL not calculated   The 10-year ASCVD risk score (Arnett DK, et al., 2019) is: 17.6%   Values used to calculate the score:     Age: 74 years     Sex: Female     Is Non-Hispanic African American: No     Diabetic: Yes     Tobacco smoker: No     Systolic Blood Pressure: 793 mmHg     Is BP treated: Yes     HDL Cholesterol: 44 mg/dL     Total Cholesterol: 194 mg/dL  GERD: -Currently on Protonix 40 taking PRN, symptoms well controlled.  Diabetes, Type 2: -Follows with Endocrinology, been a diabetic since 2004 -Last A1c 8.7% 1/23 -Medications: Restarted on Tresiba 20 units, Metformin 500 TID, Glimepiride 2 mg BID -Patient is compliant with the above medications and reports no side effects.  -Eye exam: 6 months ago,  next appointment in March -Foot exam: Following with Podiatrist, will see them again in March. Had one bone removed in right foot after multiple issues with pressure ulcers. Also has history of Charcot's bilaterally. Surgery was in 2019. Since surgery needs to wear foot brace, which does not fit into normal tennis shoes.  -Microalbumin: 8/22 -Statin: No, history of intolerance due to liver toxicity.  -PNA vaccine: up to date in 9/03 -Complications: L BKA 0092, poor wound healing, neuropathy in feet and hands. History of osteomyelitis in left lower extremity.  -Denies symptoms of hypoglycemia, polyuria, polydipsia, numbness extremities, foot ulcers/trauma.  -Does have diabetic neuropathy, on Gabapentin and Cymbalta.   Asthma:  -Asthma status: stable -Current Treatments: Albuterol PRN - using rarely, last time 3 months ago -Satisfied with current treatment?: yes -Dyspnea frequency: no -Wheezing frequency: no -Cough frequency: no -Nocturnal symptom frequency: no -Current upper respiratory symptoms: no -Pneumovax: Up to Date -Influenza: Not up to Date  Heath Maintenance: -Blood work UTD -Mammogram 3/22: [place] 1, calling to schedule  -Colonoscopy: 12/2015, repeat in 4 years, will call and schedule   Observations/Objective:  General: in no acute distress Neuro: answers all questions appropriately   Assessment and Plan:  Benign essential hypertension Stable, BP at goal at home. Continue HCTZ and follow up in 6 months for recheck.  Asthma Stable, continue Albuterol PRN.  Hepatotoxicity due to statin drug Statin intolerance.   Charcot's joint of foot due to diabetes [facility]) Following with Endo and Podiatry, got  new diabetic shoes.   Type 2 diabetes mellitus with diabetic neuropathy, with long-term current use of insulin ([place]) Following with Endo.  Hyperlipidemia Reviewed lipid panel from January, still uncontrolled. Has statin intolerance, currently on fish oil, Fenofibrate and  Zetia.    Follow Up Instructions: 6 months in person     I discussed the assessment and treatment plan with the patient. The patient was provided an opportunity to ask questions and all were answered. The patient agreed with the plan and demonstrated an understanding of the instructions.   The patient was advised to call back or seek an in-person evaluation if the symptoms worsen or if the condition fails to improve as anticipated.  I provided 30 minutes of non-face-to-face time during this encounter.   Teodora Medici, DO

## 2021-08-03 ENCOUNTER — Ambulatory Visit: Admission: RE | Admit: 2021-08-03 | Payer: Medicare Other | Source: Ambulatory Visit

## 2021-08-03 ENCOUNTER — Other Ambulatory Visit: Payer: Self-pay | Admitting: Nurse Practitioner

## 2021-08-03 DIAGNOSIS — R1084 Generalized abdominal pain: Secondary | ICD-10-CM | POA: Insufficient documentation

## 2021-08-03 DIAGNOSIS — Z8719 Personal history of other diseases of the digestive system: Secondary | ICD-10-CM | POA: Insufficient documentation

## 2021-08-03 DIAGNOSIS — R10814 Left lower quadrant abdominal tenderness: Secondary | ICD-10-CM | POA: Insufficient documentation

## 2021-08-03 DIAGNOSIS — R1032 Left lower quadrant pain: Secondary | ICD-10-CM

## 2021-08-03 DIAGNOSIS — R194 Change in bowel habit: Secondary | ICD-10-CM | POA: Insufficient documentation

## 2021-08-05 ENCOUNTER — Other Ambulatory Visit: Payer: Self-pay

## 2021-08-05 ENCOUNTER — Ambulatory Visit
Admission: RE | Admit: 2021-08-05 | Discharge: 2021-08-05 | Disposition: A | Discharge status: Home / Self Care | Payer: Medicare Other | Source: Ambulatory Visit | Attending: Nurse Practitioner | Admitting: Nurse Practitioner

## 2021-08-05 DIAGNOSIS — R1032 Left lower quadrant pain: Secondary | ICD-10-CM | POA: Diagnosis present

## 2021-08-05 MED ORDER — IOHEXOL 300 MG/ML  SOLN
100.0000 mL | Freq: Once | INTRAMUSCULAR | Status: AC | PRN
  Administered 2021-08-05: 100 mL via INTRAVENOUS

## 2021-08-23 ENCOUNTER — Telehealth: Payer: Self-pay

## 2021-08-23 NOTE — Telephone Encounter (Signed)
Copied from [place] [id number] [phone]. Topic: General - Other >> Aug 23, 2021  8:53 AM [name], [name] wrote: Reason for CRM: Pt is requesting transportation for her upcoming appointment AWV on 08/25/2021.  Please advise.

## 2021-08-23 NOTE — Telephone Encounter (Signed)
Pt would like to make this appt virtual, are you able to cancel her transportation for this date.

## 2021-08-25 ENCOUNTER — Ambulatory Visit: Payer: Medicare Other

## 2021-08-29 ENCOUNTER — Telehealth: Payer: Self-pay | Admitting: Family Medicine

## 2021-08-29 NOTE — Telephone Encounter (Signed)
Copied from [place] [phone]. Topic: Medicare AWV ?>> Aug 29, 2021 12:36 PM Cher Nakai R wrote: ?Reason for CRM:  ?Left message for patient to call back and schedule Medicare Annual Wellness Visit (AWV) in office.  ? ?If unable to come into the office for AWV,  please offer to do virtually or by telephone. ? ?Last AWV: 08/24/2020 ? ?Please schedule at anytime with [facility]. ? ?30 minute appointment for Virtual or phone ?45 minute appointment for in office or Initial virtual/phone ? ?Any questions, please contact me at [phone] ?

## 2021-08-29 NOTE — Telephone Encounter (Signed)
Copied from [place] [id number]. Topic: Medicare AWV ?>> Aug 29, 2021 12:36 PM Cher Nakai R wrote: ?Reason for CRM:  ?Left message for patient to call back and schedule Medicare Annual Wellness Visit (AWV) in office.  ? ?If unable to come into the office for AWV,  please offer to do virtually or by telephone. ? ?Last AWV: 08/24/2020 ? ?Please schedule at anytime with [facility]. ? ?30 minute appointment for Virtual or phone ?45 minute appointment for in office or Initial virtual/phone ? ?Any questions, please contact me at [phone] ?

## 2021-09-05 ENCOUNTER — Ambulatory Visit: Payer: Medicare Other | Admitting: Anesthesiology

## 2021-09-05 ENCOUNTER — Other Ambulatory Visit: Payer: Self-pay

## 2021-09-05 ENCOUNTER — Encounter
Admission: RE | Disposition: A | Discharge status: Home / Self Care | Payer: Self-pay | Source: Ambulatory Visit | Attending: Gastroenterology

## 2021-09-05 ENCOUNTER — Ambulatory Visit
Admission: RE | Admit: 2021-09-05 | Discharge: 2021-09-05 | Disposition: A | Discharge status: Home / Self Care | Payer: Medicare Other | Source: Ambulatory Visit | Attending: Gastroenterology | Admitting: Gastroenterology

## 2021-09-05 DIAGNOSIS — K640 First degree hemorrhoids: Secondary | ICD-10-CM | POA: Insufficient documentation

## 2021-09-05 DIAGNOSIS — Z7982 Long term (current) use of aspirin: Secondary | ICD-10-CM | POA: Diagnosis not present

## 2021-09-05 DIAGNOSIS — E669 Obesity, unspecified: Secondary | ICD-10-CM | POA: Diagnosis not present

## 2021-09-05 DIAGNOSIS — K6389 Other specified diseases of intestine: Secondary | ICD-10-CM | POA: Diagnosis not present

## 2021-09-05 DIAGNOSIS — K449 Diaphragmatic hernia without obstruction or gangrene: Secondary | ICD-10-CM | POA: Insufficient documentation

## 2021-09-05 DIAGNOSIS — Z89519 Acquired absence of unspecified leg below knee: Secondary | ICD-10-CM | POA: Diagnosis not present

## 2021-09-05 DIAGNOSIS — R194 Change in bowel habit: Secondary | ICD-10-CM | POA: Diagnosis present

## 2021-09-05 DIAGNOSIS — K2100 Gastro-esophageal reflux disease with esophagitis, without bleeding: Secondary | ICD-10-CM | POA: Diagnosis not present

## 2021-09-05 DIAGNOSIS — K5730 Diverticulosis of large intestine without perforation or abscess without bleeding: Secondary | ICD-10-CM | POA: Insufficient documentation

## 2021-09-05 DIAGNOSIS — R1032 Left lower quadrant pain: Secondary | ICD-10-CM | POA: Diagnosis present

## 2021-09-05 DIAGNOSIS — K529 Noninfective gastroenteritis and colitis, unspecified: Secondary | ICD-10-CM | POA: Diagnosis not present

## 2021-09-05 DIAGNOSIS — E119 Type 2 diabetes mellitus without complications: Secondary | ICD-10-CM | POA: Insufficient documentation

## 2021-09-05 HISTORY — PX: COLONOSCOPY: SHX5424

## 2021-09-05 HISTORY — PX: ESOPHAGOGASTRODUODENOSCOPY: SHX5428

## 2021-09-05 LAB — GLUCOSE, CAPILLARY: Glucose-Capillary: 213 mg/dL — ABNORMAL HIGH (ref 70–99)

## 2021-09-05 SURGERY — COLONOSCOPY
Anesthesia: General

## 2021-09-05 MED ORDER — MIDAZOLAM HCL 2 MG/2ML IJ SOLN
INTRAMUSCULAR | Status: DC | PRN
  Administered 2021-09-05: 2 mg via INTRAVENOUS

## 2021-09-05 MED ORDER — EPHEDRINE 5 MG/ML INJ
INTRAVENOUS | Status: AC
  Filled 2021-09-05: qty 5

## 2021-09-05 MED ORDER — PROPOFOL 10 MG/ML IV BOLUS
INTRAVENOUS | Status: DC | PRN
Start: 2021-09-05 — End: 2021-09-05
  Administered 2021-09-05 (×2): 20 mg via INTRAVENOUS
  Administered 2021-09-05: 10 mg via INTRAVENOUS

## 2021-09-05 MED ORDER — SODIUM CHLORIDE 0.9 % IV SOLN: INTRAVENOUS | Status: DC

## 2021-09-05 MED ORDER — FENTANYL CITRATE (PF) 100 MCG/2ML IJ SOLN
INTRAMUSCULAR | Status: DC | PRN
  Administered 2021-09-05 (×2): 25 ug via INTRAVENOUS
  Administered 2021-09-05: 50 ug via INTRAVENOUS

## 2021-09-05 MED ORDER — MIDAZOLAM HCL 2 MG/2ML IJ SOLN
INTRAMUSCULAR | Status: AC
Start: 2021-09-05 — End: ?
  Filled 2021-09-05: qty 2

## 2021-09-05 MED ORDER — DEXMEDETOMIDINE HCL IN NACL 80 MCG/20ML IV SOLN
INTRAVENOUS | Status: AC
  Filled 2021-09-05: qty 20

## 2021-09-05 MED ORDER — PROPOFOL 10 MG/ML IV BOLUS
INTRAVENOUS | Status: AC
  Filled 2021-09-05: qty 20

## 2021-09-05 MED ORDER — FENTANYL CITRATE (PF) 100 MCG/2ML IJ SOLN
INTRAMUSCULAR | Status: AC
  Filled 2021-09-05: qty 2

## 2021-09-05 MED ORDER — PROPOFOL 500 MG/50ML IV EMUL
INTRAVENOUS | Status: DC | PRN
  Administered 2021-09-05: 50 ug/kg/min via INTRAVENOUS

## 2021-09-05 MED ORDER — LABETALOL HCL 5 MG/ML IV SOLN
INTRAVENOUS | Status: AC
  Filled 2021-09-05: qty 4

## 2021-09-05 MED ORDER — LIDOCAINE HCL (CARDIAC) PF 100 MG/5ML IV SOSY
PREFILLED_SYRINGE | INTRAVENOUS | Status: DC | PRN
  Administered 2021-09-05: 50 mg via INTRAVENOUS

## 2021-09-05 NOTE — Op Note (Signed)
[facility] ?Gastroenterology ?Patient Name: Scottlyn Mchaney ?Procedure Date: 09/05/2021 9:33 AM ?MRN: [id number] ?Account #: [id number] ?Date of Birth: April 29, 1956 ?Admit Type: Outpatient ?Age: 66 ?Room: [facility] ENDO ROOM 3 ?Gender: Female ?Note Status: Finalized ?Instrument Name: Colonoscope [id number] ?Procedure:             Colonoscopy ?Indications:           Abdominal pain in the left lower quadrant, Change in  ?                       bowel habits ?Providers:             Andrey Farmer MD, MD ?Medicines:             Monitored Anesthesia Care ?Complications:         No immediate complications. Estimated blood loss:  ?                       Minimal. ?Procedure:             Pre-Anesthesia Assessment: ?                       - Prior to the procedure, a History and Physical was  ?                       performed, and patient medications and allergies were  ?                       reviewed. The patient is competent. The risks and  ?                       benefits of the procedure and the sedation options and  ?                       risks were discussed with the patient. All questions  ?                       were answered and informed consent was obtained.  ?                       Patient identification and proposed procedure were  ?                       verified by the physician, the nurse, the  ?                       anesthesiologist, the anesthetist and the technician  ?                       in the endoscopy suite. Mental Status Examination:  ?                       alert and oriented. Airway Examination: normal  ?                       oropharyngeal airway and neck mobility. Respiratory  ?                       Examination: clear to auscultation. CV Examination:  ?  normal. Prophylactic Antibiotics: The patient does not  ?                       require prophylactic antibiotics. Prior  ?                       Anticoagulants: The patient has taken no previous  ?                        anticoagulant or antiplatelet agents. ASA Grade  ?                       Assessment: III - A patient with severe systemic  ?                       disease. After reviewing the risks and benefits, the  ?                       patient was deemed in satisfactory condition to  ?                       undergo the procedure. The anesthesia plan was to use  ?                       monitored anesthesia care (MAC). Immediately prior to  ?                       administration of medications, the patient was  ?                       re-assessed for adequacy to receive sedatives. The  ?                       heart rate, respiratory rate, oxygen saturations,  ?                       blood pressure, adequacy of pulmonary ventilation, and  ?                       response to care were monitored throughout the  ?                       procedure. The physical status of the patient was  ?                       re-assessed after the procedure. ?                       After obtaining informed consent, the colonoscope was  ?                       passed under direct vision. Throughout the procedure,  ?                       the patient's blood pressure, pulse, and oxygen  ?                       saturations were monitored continuously. The  ?  Colonoscope was introduced through the anus and  ?                       advanced to the the cecum, identified by appendiceal  ?                       orifice and ileocecal valve. The colonoscopy was  ?                       somewhat difficult due to a tortuous colon. The  ?                       patient tolerated the procedure well. The quality of  ?                       the bowel preparation was good. ?Findings: ?     The perianal and digital rectal examinations were normal. ?     A diffuse area of granular mucosa was found in the sigmoid colon and in  ?     the descending colon. Biopsies were taken with a cold forceps for  ?     histology. Estimated blood loss was  minimal. ?     Normal mucosa was found in the rectum and in the ascending colon.  ?     Biopsies were taken with a cold forceps for histology. Estimated blood  ?     loss was minimal. ?     A few small-mouthed diverticula were found in the sigmoid colon,  ?     descending colon and ascending colon. ?     Internal hemorrhoids were found during retroflexion. The hemorrhoids  ?     were Grade I (internal hemorrhoids that do not prolapse). ?     The exam was otherwise without abnormality on direct and retroflexion  ?     views. ?Impression:            - Granularity in the sigmoid colon and in the  ?                       descending colon. Biopsied. ?                       - Normal mucosa in the rectum and in the ascending  ?                       colon. Biopsied. ?                       - Diverticulosis in the sigmoid colon, in the  ?                       descending colon and in the ascending colon. ?                       - Internal hemorrhoids. ?                       - The examination was otherwise normal on direct and  ?                       retroflexion views. ?Recommendation:        -  Discharge patient to home. ?                       - Resume previous diet. ?                       - Continue present medications. ?                       - Await pathology results. ?                       - Repeat colonoscopy for surveillance based on  ?                       pathology results. ?                       - Return to referring physician as previously  ?                       scheduled. ?Procedure Code(s):     --- Professional --- ?                       [phone], Colonoscopy, flexible; with biopsy, single or  ?                       multiple ?Diagnosis Code(s):     --- Professional --- ?                       K63.89, Other specified diseases of intestine ?                       K64.0, First degree hemorrhoids ?                       R10.32, Left lower quadrant pain ?                       R19.4, Change in bowel habit ?                        K57.30, Diverticulosis of large intestine without  ?                       perforation or abscess without bleeding ?CPT copyright 2019 [employer]. All rights reserved. ?The codes documented in this report are preliminary and upon coder review may  ?be revised to meet current compliance requirements. ?Andrey Farmer MD, MD ?09/05/2021 10:51:58 AM ?Number of Addenda: 0 ?Note Initiated On: 09/05/2021 9:33 AM ?Scope Withdrawal Time: 0 hours 12 minutes 45 seconds  ?Total Procedure Duration: 0 hours 16 minutes 49 seconds  ?Estimated Blood Loss:  Estimated blood loss was minimal. ?     [facility] ?

## 2021-09-05 NOTE — Anesthesia Preprocedure Evaluation (Signed)
Anesthesia Evaluation  ?Patient identified by MRN, date of birth, ID band ?Patient awake ? ? ? ?Reviewed: ?Allergy & Precautions, H&P , NPO status , Patient's Chart, lab work & pertinent test results, reviewed documented beta blocker date and time  ? ?History of Anesthesia Complications ?(+) PONV and history of anesthetic complications ? ?Airway ?Mallampati: II ? ? ?Neck ROM: full ? ? ? Dental ? ?(+) Poor Dentition ?  ?Pulmonary ?asthma , sleep apnea and Continuous Positive Airway Pressure Ventilation , pneumonia, resolved,  ?  ?Pulmonary exam normal ? ? ? ? ? ? ? Cardiovascular ?Exercise Tolerance: Poor ?hypertension, On Medications ?+ Peripheral Vascular Disease  ?Normal cardiovascular exam ?Rhythm:regular Rate:Normal ? ? ?  ?Neuro/Psych ? Neuromuscular disease CVA negative psych ROS  ? GI/Hepatic ?Neg liver ROS, GERD  Medicated,  ?Endo/Other  ?negative endocrine ROSdiabetes ? Renal/GU ?negative Renal ROS  ?negative genitourinary ?  ?Musculoskeletal ? ? Abdominal ?  ?Peds ? Hematology ? ?(+) Blood dyscrasia, anemia ,   ?Anesthesia Other Findings ?Past Medical History: ?No date: Anemia ?No date: Arthritis ?No date: Asthma ?No date: Colon polyps ?    Comment:  adenomatous ?No date: Diabetes mellitus without complication ([place]) ?17/0017: Diabetic infection of left foot ([place]) ?No date: Diverticulosis of colon ?No date: Esophagitis ?No date: GERD (gastroesophageal reflux disease) ?No date: Hemorrhoid ?    Comment:  internal ?No date: Hyperlipemia ?No date: Hypertension ?No date: IBS (irritable bowel syndrome) ?    Comment:  no current prob - diet controlled ?11/19/2017: Myalgia due to statin ?No date: Neuropathy ?No date: Neuropathy of both feet ?No date: Neuropathy of hand ?No date: Pneumonia ?    Comment:  x 4 ?No date: PONV (postoperative nausea and vomiting) ?    Comment:  on some surgeries but not all procedures ?12/31/2016: Skin ulcer of right ankle, limited to breakdown of skin   ?(HCC) ?    Comment:  resolved per patient 05/14/19 ?No date: Sleep apnea ?    Comment:  has had in the past lost 50 pounds and do longer uses  ?             cpap ?04/23/2015: Statin intolerance ?2009: Stroke [facility]) ?    Comment:  mini stroke per patient ?2009: Stroke-like episode ?    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    Comment:  Procedure: PARTIAL AMPUTATION LEFT FOOT;  Surgeon:  ?             Edrick Kins, DPM;  Location: [place];  Service:  ?             Podiatry;  Laterality: Left; ?05/16/2019: AMPUTATION; Left ?    Comment:  Procedure: LEFT BELOW KNEE AMPUTATION;  Surgeon: Sharol Given,  ?             Illene Regulus, MD;  Location: [place];  Service: Orthopedics;   ?             Laterality: Left; ?09/02/2015: BACK SURGERY ?12/06/2018: BONE BIOPSY; Left ?    Comment:  Procedure: Bone Biopsy;  Surgeon: Edrick Kins, DPM;   ?             Location: [place] OR;  Service: Podiatry;  Laterality: Left; ?No date: CERVICAL FUSION ?1986: CESAREAN SECTION ?11/30/2012: COLONOSCOPY; N/A ?  Comment:  Procedure: COLONOSCOPY;  Surgeon: Irene Shipper, MD;   ?             Location: [facility] ENDOSCOPY;  Service: Endoscopy;  Laterality: ?             N/A; ?01/03/2016: COLONOSCOPY WITH PROPOFOL; N/A ?    Comment:  Procedure: COLONOSCOPY WITH PROPOFOL;  Surgeon: Herbie Baltimore T ?             Vira Agar, MD;  Location: [place];  Service:  ?             Endoscopy;  Laterality: N/A; ?01/14/2015: ESOPHAGOGASTRODUODENOSCOPY (EGD) WITH PROPOFOL; N/A ?    Comment:  Procedure: ESOPHAGOGASTRODUODENOSCOPY (EGD) WITH  ?             PROPOFOL;  Surgeon: Manya Silvas, MD;  Location: [facility] ?             ENDOSCOPY;  Service: Endoscopy;  Laterality: N/A; ?03/13/2018: IRRIGATION AND DEBRIDEMENT FOOT; Left ?    Comment:  Procedure: IRRIGATION AND  DEBRIDEMENT FOOT WITH BONE  ?             BIOPSY WITH MISONIX DEBRIDER;  Surgeon: Evelina Bucy, ?             DPM;  Location: [place];  Service: Podiatry;  Laterality:  ?             Left; ?12/06/2018: IRRIGATION AND DEBRIDEMENT FOOT; Left ?    Comment:  Procedure: Irrigation And Debridement Foot;  Surgeon:  ?             Edrick Kins, DPM;  Location: [place];  Service:  ?             Podiatry;  Laterality: Left; ?No date: LEG AMPUTATION BELOW KNEE; Left ?10/01/2015: LUMBAR WOUND DEBRIDEMENT; N/A ?    Comment:  Procedure: LUMBAR WOUND DEBRIDEMENT;  Surgeon: Marylyn Ishihara  ?             Christella Noa, MD;  Location: [place] NEURO ORS;  Service:  ?             Neurosurgery;  Laterality: N/A;  LUMBAR WOUND DEBRIDEMENT ?No date: NASAL SINUS SURGERY ?01/14/2015: SAVORY DILATION; N/A ?    Comment:  Procedure: SAVORY DILATION;  Surgeon: Manya Silvas,  ?             MD;  Location: [facility] ENDOSCOPY;  Service: Endoscopy;   ?             Laterality: N/A; ?No date: TONSILLECTOMY ?No date: WISDOM TOOTH EXTRACTION ?BMI   ? Body Mass Index: 35.94 kg/m?  ?  ? Reproductive/Obstetrics ?negative OB ROS ? ?  ? ? ? ? ? ? ? ? ? ? ? ? ? ?  ?  ? ? ? ? ? ? ? ? ?Anesthesia Physical ?Anesthesia Plan ? ?ASA: 3 ? ?Anesthesia Plan: General  ? ?Post-op Pain Management:   ? ?Induction:  ? ?PONV Risk Score and Plan:  ? ?Airway Management Planned:  ? ?Additional Equipment:  ? ?Intra-op Plan:  ? ?Post-operative Plan:  ? ?Informed Consent: I have reviewed the patients History and Physical, chart, labs and discussed the procedure including the risks, benefits and alternatives for the proposed anesthesia with the patient or authorized representative who has indicated his/her understanding and acceptance.  ? ? ? ?Dental Advisory Given ? ?Plan Discussed with: CRNA ? ?Anesthesia Plan Comments:   ? ? ? ? ? ? ?  Anesthesia Quick Evaluation ? ?

## 2021-09-05 NOTE — Transfer of Care (Signed)
Immediate Anesthesia Transfer of Care Note ? ?Patient: Olivia Romero ? ?Procedure(s) Performed: COLONOSCOPY ?ESOPHAGOGASTRODUODENOSCOPY (EGD) ? ?Patient Location: PACU ? ?Anesthesia Type:General ? ?Level of Consciousness: sedated ? ?Airway & Oxygen Therapy: Patient Spontanous Breathing ? ?Post-op Assessment: Report given to RN and Post -op Vital signs reviewed and stable ? ?Post vital signs: Reviewed and stable ? ?Last Vitals:  ?Vitals Value Taken Time  ?BP 111/57 09/05/21 1033  ?Temp 35.6 ?C 09/05/21 1033  ?Pulse 70 09/05/21 1033  ?Resp    ?SpO2 97 % 09/05/21 1033  ? ? ?Last Pain:  ?Vitals:  ? 09/05/21 1033  ?TempSrc: Temporal  ?PainSc: Asleep  ?   ? ?  ? ?Complications: No notable events documented. ?

## 2021-09-05 NOTE — H&P (Signed)
Outpatient short stay form Pre-procedure ?09/05/2021  ?Lesly Rubenstein, MD ? ?Primary Physician: Delsa Grana, PA-C ? ?Reason for visit:  Change in bowel habits/Positive fecal calprotectin ? ?History of present illness:   ? ?66 y/o lady with history of indeterminate colitis, obesity, DM II, and BKA here for EGD/Colonoscopy due to nausea and change in bowel habits with positive fecal calprotectin. Is not on any UC therapy. Takes aspirin but no other blood thinners. History of hysterectomy. ? ? ? ?Current Facility-Administered Medications:  ?  0.9 %  sodium chloride infusion, , Intravenous, Continuous, Annya Lizana, Hilton Cork, MD ? ?Medications Prior to Admission  ?Medication Sig Dispense Refill Last Dose  ? aspirin EC 325 MG tablet Take 1 tablet (325 mg total) by mouth daily. 30 tablet 0 09/04/2021  ? cetirizine (ZYRTEC ALLERGY) 10 MG tablet Take 1 tablet (10 mg total) by mouth daily. 90 tablet 1 09/04/2021  ? DULoxetine (CYMBALTA) 60 MG capsule Take 120 mg by mouth daily.   09/04/2021  ? ezetimibe (ZETIA) 10 MG tablet Take 1 tablet (10 mg total) by mouth daily. 90 tablet 3 09/04/2021  ? fenofibrate (TRICOR) 145 MG tablet TAKE 1 TABLET BY MOUTH ONCE DAILY 90 tablet 1 09/04/2021  ? gabapentin (NEURONTIN) 100 MG capsule Take by mouth.   09/04/2021  ? glimepiride (AMARYL) 2 MG tablet Take 2 mg by mouth 2 (two) times daily.    09/04/2021  ? hydrochlorothiazide (MICROZIDE) 12.5 MG capsule Take 1 capsule (12.5 mg total) by mouth daily. 90 capsule 3 09/04/2021  ? insulin degludec (TRESIBA) 200 UNIT/ML FlexTouch Pen Inject 20 Units into the skin at bedtime.    09/05/2021  ? metFORMIN (GLUCOPHAGE-XR) 500 MG 24 hr tablet Take 500 mg by mouth 3 (three) times daily.    09/04/2021  ? pantoprazole (PROTONIX) 40 MG tablet Take 1 tablet (40 mg total) by mouth daily as needed (Heartburn). 90 tablet 2 09/04/2021  ? acetaminophen (TYLENOL) 500 MG tablet Take 1,000 mg by mouth every 6 (six) hours as needed for mild pain. Maximum of 3,000 mg per day     ?  albuterol (VENTOLIN HFA) 108 (90 Base) MCG/ACT inhaler INHALE 2 PUFFS INTO THE LUNGS EVERY 4 HOURS AS NEEDED 6.7 g 0   ? allopurinol (ZYLOPRIM) 100 MG tablet Take 1 tablet (100 mg total) by mouth 2 (two) times daily. 60 tablet 3   ? Blood Glucose Monitoring Suppl (CONTOUR NEXT MONITOR) w/Device KIT as directed.     ? Colchicine 0.6 MG CAPS Take 0.6 mg by mouth 2 (two) times daily as needed. 60 capsule 1   ? gabapentin (NEURONTIN) 400 MG capsule Take 400 mg by mouth 4 (four) times daily.     ? Incontinence Supply Disposable (ASSURANCE FITTED BRIEF LARGE) MISC Use 1 brief up to 6 times a day as needed for urinary incontinence 180 each 5   ? ULTICARE SHORT PEN NEEDLES 31G X 8 MM MISC      ? ? ? ?Allergies  ?Allergen Reactions  ? Eggs Or Egg-Derived Products Swelling and Other (See Comments)  ?  Angioedema  ? Atorvastatin Other (See Comments)  ?  Liver toxicity  ? Influenza Virus Vaccine Swelling  ?  Arm swelled (site of injection)  ? Latex Itching  ? Pravastatin Itching and Rash  ? Tape Rash and Other (See Comments)  ?  Adhesive on foot pull off skin.  Ok to use paper tape.  ? ? ? ?Past Medical History:  ?Diagnosis Date  ? Anemia   ?  Arthritis   ? Asthma   ? Colon polyps   ? adenomatous  ? Diabetes mellitus without complication ([place])   ? Diabetic infection of left foot ([place]) 12/2018  ? Diverticulosis of colon   ? Esophagitis   ? GERD (gastroesophageal reflux disease)   ? Hemorrhoid   ? internal  ? Hyperlipemia   ? Hypertension   ? IBS (irritable bowel syndrome)   ? no current prob - diet controlled  ? Myalgia due to statin 11/19/2017  ? Neuropathy   ? Neuropathy of both feet   ? Neuropathy of hand   ? Pneumonia   ? x 4  ? PONV (postoperative nausea and vomiting)   ? on some surgeries but not all procedures  ? Skin ulcer of right ankle, limited to breakdown of skin ([place]) 12/31/2016  ? resolved per patient 05/14/19  ? Sleep apnea   ? has had in the past lost 50 pounds and do longer uses cpap  ? Statin intolerance  04/23/2015  ? Stroke [facility]) 2009  ? mini stroke per patient  ? Stroke-like episode 2009  ? TIA - mini stroke per patient  ? 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 Uncontrolled type 2 diabetes mellitus with hyperglycemia ([place]) 11/01/2017  ? ? ?Review of systems:  Otherwise negative.  ? ? ?Physical Exam ? ?Gen: Alert, oriented. Appears stated age.  ?HEENT: PERRLA. ?Lungs: No respiratory distress ?CV: RRR ?Abd: soft, benign, no masses ?Ext: No edema ? ? ? ?Planned procedures: Proceed with EGD/colonoscopy. The patient understands the nature of the planned procedure, indications, risks, alternatives and potential complications including but not limited to bleeding, infection, perforation, damage to internal organs and possible oversedation/side effects from anesthesia. The patient agrees and gives consent to proceed.  ?Please refer to procedure notes for findings, recommendations and patient disposition/instructions.  ? ? ? ?Lesly Rubenstein, MD ?[facility] ? ? ? ?  ? ?

## 2021-09-05 NOTE — Interval H&P Note (Signed)
History and Physical Interval Note: ? ?09/05/2021 ?10:03 AM ? ?Olivia Romero  has presented today for surgery, with the diagnosis of Change in bowel habits (R19.4) ?Abdominal pain, generalized (R10.84) ?Left lower quadrant abdominal tenderness without rebound tenderness (R10.814) ?Nausea (R11.0) ?Gastroesophageal reflux disease, unspecified whether esophagitis present (K21.9) ?History of colitis (Z87.19).  The various methods of treatment have been discussed with the patient and family. After consideration of risks, benefits and other options for treatment, the patient has consented to  Procedure(s): ?COLONOSCOPY (N/A) ?ESOPHAGOGASTRODUODENOSCOPY (EGD) (N/A) as a surgical intervention.  The patient's history has been reviewed, patient examined, no change in status, stable for surgery.  I have reviewed the patient's chart and labs.  Questions were answered to the patient's satisfaction.   ? ? ?Hilton Cork Janice Bodine ? ?Ok to proceed with EGD/Colonoscopy ?

## 2021-09-05 NOTE — Op Note (Signed)
[facility] ?Gastroenterology ?Patient Name: Olivia Romero ?Procedure Date: 09/05/2021 9:35 AM ?MRN: [id number] ?Account #: [id number] ?Date of Birth: 01/11/1956 ?Admit Type: Outpatient ?Age: 66 ?Room: [facility] ENDO ROOM 3 ?Gender: Female ?Note Status: Finalized ?Instrument Name: Upper Endoscope [id number] ?Procedure:             Upper GI endoscopy ?Indications:           Gastro-esophageal reflux disease, Nausea ?Providers:             Andrey Farmer MD, MD ?Medicines:             Monitored Anesthesia Care ?Complications:         No immediate complications. ?Procedure:             Pre-Anesthesia Assessment: ?                       - Prior to the procedure, a History and Physical was  ?                       performed, and patient medications and allergies were  ?                       reviewed. The patient is competent. The risks and  ?                       benefits of the procedure and the sedation options and  ?                       risks were discussed with the patient. All questions  ?                       were answered and informed consent was obtained.  ?                       Patient identification and proposed procedure were  ?                       verified by the physician, the nurse, the  ?                       anesthesiologist, the anesthetist and the technician  ?                       in the endoscopy suite. Mental Status Examination:  ?                       alert and oriented. Airway Examination: normal  ?                       oropharyngeal airway and neck mobility. Respiratory  ?                       Examination: clear to auscultation. CV Examination:  ?                       normal. Prophylactic Antibiotics: The patient does not  ?                       require prophylactic antibiotics. Prior  ?  Anticoagulants: The patient has taken no previous  ?                       anticoagulant or antiplatelet agents except for  ?                       aspirin. ASA  Grade Assessment: III - A patient with  ?                       severe systemic disease. After reviewing the risks and  ?                       benefits, the patient was deemed in satisfactory  ?                       condition to undergo the procedure. The anesthesia  ?                       plan was to use monitored anesthesia care (MAC).  ?                       Immediately prior to administration of medications,  ?                       the patient was re-assessed for adequacy to receive  ?                       sedatives. The heart rate, respiratory rate, oxygen  ?                       saturations, blood pressure, adequacy of pulmonary  ?                       ventilation, and response to care were monitored  ?                       throughout the procedure. The physical status of the  ?                       patient was re-assessed after the procedure. ?                       After obtaining informed consent, the endoscope was  ?                       passed under direct vision. Throughout the procedure,  ?                       the patient's blood pressure, pulse, and oxygen  ?                       saturations were monitored continuously. The Endoscope  ?                       was introduced through the mouth, and advanced to the  ?                       second part of duodenum. The upper GI endoscopy  was  ?                       accomplished without difficulty. The patient tolerated  ?                       the procedure well. ?Findings: ?     A small hiatal hernia was present. ?     The entire examined stomach was normal. ?     The examined duodenum was normal. ?Impression:            - Small hiatal hernia. ?                       - Normal stomach. ?                       - Normal examined duodenum. ?                       - No specimens collected. ?Recommendation:        - Perform a colonoscopy today. ?Procedure Code(s):     --- Professional --- ?                       [phone], Esophagogastroduodenoscopy,  flexible,  ?                       transoral; diagnostic, including collection of  ?                       specimen(s) by brushing or washing, when performed  ?                       (separate procedure) ?Diagnosis Code(s):     --- Professional --- ?                       K44.9, Diaphragmatic hernia without obstruction or  ?                       gangrene ?                       K21.9, Gastro-esophageal reflux disease without  ?                       esophagitis ?                       R11.0, Nausea ?CPT copyright 2019 American Medical Association. All rights reserved. ?The codes documented in this report are preliminary and upon coder review may  ?be revised to meet current compliance requirements. ?Andrey Farmer MD, MD ?09/05/2021 10:46:39 AM ?Number of Addenda: 0 ?Note Initiated On: 09/05/2021 9:35 AM ?Estimated Blood Loss:  Estimated blood loss: none. ?     [facility] ?

## 2021-09-05 NOTE — Anesthesia Postprocedure Evaluation (Signed)
Anesthesia Post Note ? ?Patient: Olivia Romero ? ?Procedure(s) Performed: COLONOSCOPY ?ESOPHAGOGASTRODUODENOSCOPY (EGD) ? ?Patient location during evaluation: PACU ?Anesthesia Type: General ?Level of consciousness: awake and alert ?Pain management: pain level controlled ?Vital Signs Assessment: post-procedure vital signs reviewed and stable ?Respiratory status: spontaneous breathing, nonlabored ventilation, respiratory function stable and patient connected to nasal cannula oxygen ?Cardiovascular status: blood pressure returned to baseline and stable ?Postop Assessment: no apparent nausea or vomiting ?Anesthetic complications: no ? ? ?No notable events documented. ? ? ?Last Vitals:  ?Vitals:  ? 09/05/21 1053 09/05/21 1103  ?BP: (!) 143/69 (!) 130/94  ?Pulse: 63 67  ?Resp:    ?Temp:    ?SpO2: 98% 98%  ?  ?Last Pain:  ?Vitals:  ? 09/05/21 1103  ?TempSrc:   ?PainSc: 0-No pain  ? ? ?  ?  ?  ?  ?  ?  ? ?Molli Barrows ? ? ? ? ?

## 2021-09-06 LAB — SURGICAL PATHOLOGY

## 2021-09-07 LAB — HM COLONOSCOPY

## 2021-09-20 ENCOUNTER — Telehealth: Payer: Self-pay | Admitting: Orthopedic Surgery

## 2021-09-20 NOTE — Telephone Encounter (Signed)
Pt called requesting a call back from [place] or [place]. Pt states she is having severe pains in her prostatic left leg. Pt made an appt for 4/24 but really asking to be seen by PA Erin or Dr. Sharol Given. Please call pt at [phone]. ?

## 2021-09-21 ENCOUNTER — Ambulatory Visit: Payer: Medicare Other | Admitting: Family

## 2021-09-21 NOTE — Telephone Encounter (Signed)
Lmtcb to see if she can come this afternoon to see Junie Panning, NP. ?

## 2021-09-21 NOTE — Telephone Encounter (Signed)
Thank you. I have rescheduled her for today at 2:45. ?

## 2021-09-21 NOTE — Telephone Encounter (Signed)
Pt called and states she can come in at 2:45 to see Olivia Romero. I will set up transportation if someone could open a spot please.  ? ?CB [phone]  ?

## 2021-09-22 ENCOUNTER — Ambulatory Visit (INDEPENDENT_AMBULATORY_CARE_PROVIDER_SITE_OTHER): Payer: Medicare Other | Admitting: Orthopedic Surgery

## 2021-09-22 DIAGNOSIS — Z89512 Acquired absence of left leg below knee: Secondary | ICD-10-CM

## 2021-09-25 ENCOUNTER — Encounter: Payer: Self-pay | Admitting: Orthopedic Surgery

## 2021-09-25 NOTE — Progress Notes (Signed)
? ?Office Visit Note ?  ?Patient: Olivia Romero           ?Date of Birth: 23-Aug-1955           ?MRN: [id number] ?Visit Date: 09/22/2021 ?             ?Requested by: Delsa Grana, PA-C ?[place] [place] ?[address] ?[place],  [place] [address] ?PCP: Delsa Grana, PA-C ? ?Chief Complaint  ?Patient presents with  ? Left Leg - Pain  ?  HX BKA phantom pain   ? ? ? ? ?HPI: ?Patient is a 66 year old woman who presents complaining of pain in the left transtibial amputation.  Patient states she was wearing her prosthesis 3 days ago and had acute onset of pain.  Patient states the pain feels like a stabbing pain worse at the end of the day over the lateral aspect of her leg.  She is taking Cymbalta 120 mg a day and Neurontin 1800 mg a day. ? ?Assessment & Plan: ?Visit Diagnoses:  ?1. Hx of BKA, left ([place])   ? ? ?Plan: Patient is given a prescription for a new liner and socket.  She is currently an baring on the residual limb secondary to decreased volume.  The current prosthesis has been modified several times and there is no further modifications to unload the leg available. ? ?Follow-Up Instructions: Return in about 4 weeks (around 10/20/2021).  ? ?Ortho Exam ? ?Patient is alert, oriented, no adenopathy, well-dressed, normal affect, normal respiratory effort. ?Examination the liner liner has resolved her skin rash problem.  Her current socket has a spacer as well as padding placed to try to provide a better fit.  There are no ulcers no cellulitis.  Patient has pain from the residual limb bearing weight into the socket. ? ?Patient is an existing left transtibial  amputee. ? ?Patient's current comorbidities are not expected to impact the ability to function with the prescribed prosthesis. ?Patient verbally communicates a strong desire to use a prosthesis. ?Patient currently requires mobility aids to ambulate without a prosthesis.  Expects not to use mobility aids with a new prosthesis. ? ?Patient is a K2 level ambulator that will  use a prosthesis to walk around their home and the community over low level environmental barriers.  ? ? ? ? ?Imaging: ?No results found. ?No images are attached to the encounter. ? ?Labs: ?Lab Results  ?Component Value Date  ? HGBA1C 7.2 (H) 01/21/2021  ? HGBA1C 7.5 07/01/2020  ? HGBA1C 7.3 (H) 05/16/2019  ? ESRSEDRATE 60 (H) 12/05/2018  ? ESRSEDRATE 41 (H) 12/04/2018  ? ESRSEDRATE 30 07/12/2018  ? CRP 22.0 (H) 12/05/2018  ? CRP 20 (H) 12/04/2018  ? CRP 6 07/12/2018  ? LABURIC 5.4 04/22/2020  ? LABURIC 8.7 (H) 03/25/2020  ? LABURIC 8.2 (H) 11/30/2015  ? REPTSTATUS 12/12/2018 FINAL 12/06/2018  ? GRAMSTAIN  12/06/2018  ?  RARE WBC PRESENT, PREDOMINANTLY PMN ?NO ORGANISMS SEEN ?  ? CULT  12/06/2018  ?  RARE METHICILLIN RESISTANT STAPHYLOCOCCUS AUREUS ?WITH NORMAL SKIN FLORA ?NO ANAEROBES ISOLATED ?Performed at [facility], [place] [address] [place], [place] [address] ?  ? LABORGA METHICILLIN RESISTANT STAPHYLOCOCCUS AUREUS 12/06/2018  ? ? ? ?Lab Results  ?Component Value Date  ? ALBUMIN 3.7 12/04/2018  ? ALBUMIN 4.0 11/27/2018  ? ALBUMIN 3.8 03/11/2018  ? PREALBUMIN 21.5 03/12/2018  ? ? ?No results found for: MG ?Lab Results  ?Component Value Date  ? VD25OH 11 (L) 09/20/2016  ? ? ?  Lab Results  ?Component Value Date  ? PREALBUMIN 21.5 03/12/2018  ? ? ?  Latest Ref Rng & Units 01/21/2021  ? 10:42 AM 07/12/2020  ? 11:03 AM 05/16/2019  ?  6:35 AM  ?CBC EXTENDED  ?WBC 3.8 - 10.8 Thousand/uL 9.1   7.6   8.4    ?RBC 3.80 - 5.10 Million/uL 4.68   4.40   4.35    ?Hemoglobin 11.7 - 15.5 g/dL 11.6   11.3   11.2    ?HCT 35.0 - 45.0 % 37.7   35.6   36.1    ?Platelets 140 - 400 Thousand/uL 330   344   310    ?NEUT# 1,500 - 7,800 cells/uL 5,296   4,218     ?Lymph# 850 - 3,900 cells/uL 2,803   2,622     ? ? ? ?There is no height or weight on file to calculate BMI. ? ?Orders:  ?No orders of the defined types were placed in this encounter. ? ?No orders of the defined types were placed in this encounter. ? ? ? Procedures: ?No  procedures performed ? ?Clinical Data: ?No additional findings. ? ?ROS: ? ?All other systems negative, except as noted in the HPI. ?Review of Systems ? ?Objective: ?Vital Signs: LMP  (LMP Unknown)  ? ?Specialty Comments:  ?No specialty comments available. ? ?PMFS History: ?Patient Active Problem List  ? Diagnosis Date Noted  ? Anemia 01/21/2021  ? Urticaria 10/21/2020  ? Rash and nonspecific skin eruption 07/27/2020  ? Hepatotoxicity due to statin drug 07/15/2020  ? Vitamin D deficiency 07/12/2020  ? Chronic low back pain 07/12/2020  ? BMI 36.0-36.9,adult 12/29/2019  ? Sepsis without acute organ dysfunction ([place]) 12/29/2019  ? Post-operative state 12/29/2019  ? Asthma 12/29/2019  ? Cervical disc disorder 12/29/2019  ? Displacement of cervical intervertebral disc 12/29/2019  ? Abnormal posture 12/29/2019  ? Brachial radiculitis 12/29/2019  ? Neck pain 12/29/2019  ? Pain in upper limb 12/29/2019  ? Whiplash injury to neck 12/29/2019  ? Allergy to statin medication 12/01/2019  ? History of below knee amputation, left ([place]) 07/30/2019  ? Gangrene of left foot ([place]) 05/16/2019  ? Charcot's joint of foot, left   ? PAD (peripheral artery disease) ([place]) 12/05/2018  ? MRSA bacteremia 12/05/2018  ? Charcot's joint of foot due to diabetes ([place]) 09/18/2018  ? Class 2 severe obesity with serious comorbidity and body mass index (BMI) of 37.0 to 37.9 in adult [facility]) 11/19/2017  ? B12 deficiency 11/01/2017  ? Diabetic polyneuropathy associated with type 2 diabetes mellitus ([place]) 12/13/2015  ? Lumbar stenosis with neurogenic claudication 09/02/2015  ? Chronic constipation 07/13/2015  ? OSA (obstructive sleep apnea) 06/25/2015  ? 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/23/2015  ? Hyperlipidemia 04/23/2015  ? Statin intolerance 04/23/2015  ? Asthma, mild intermittent 02/01/2015  ? Type 2 diabetes mellitus with diabetic neuropathy, with long-term current use of insulin ([place]) 12/31/2014  ? Gastroesophageal reflux disease with  esophagitis 12/31/2014  ? Dysphagia 12/31/2014  ? Bilateral carpal tunnel syndrome 12/01/2014  ? Diverticulosis of colon 11/30/2012  ? Benign essential hypertension 11/28/2012  ? ?Past Medical History:  ?Diagnosis Date  ? Anemia   ? Arthritis   ? Asthma   ? Colon polyps   ? adenomatous  ? Diabetes mellitus without complication ([place])   ? Diabetic infection of left foot ([place]) 12/2018  ? Diverticulosis of colon   ? Esophagitis   ? GERD (gastroesophageal reflux disease)   ? Hemorrhoid   ?  internal  ? Hyperlipemia   ? Hypertension   ? IBS (irritable bowel syndrome)   ? no current prob - diet controlled  ? Myalgia due to statin 11/19/2017  ? Neuropathy   ? Neuropathy of both feet   ? Neuropathy of hand   ? Pneumonia   ? x 4  ? PONV (postoperative nausea and vomiting)   ? on some surgeries but not all procedures  ? Skin ulcer of right ankle, limited to breakdown of skin ([place]) 12/31/2016  ? resolved per patient 05/14/19  ? Sleep apnea   ? has had in the past lost 50 pounds and do longer uses cpap  ? Statin intolerance 04/23/2015  ? Stroke [facility]) 2009  ? mini stroke per patient  ? Stroke-like episode 2009  ? TIA - mini stroke per patient  ? 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 Uncontrolled type 2 diabetes mellitus with hyperglycemia ([place]) 11/01/2017  ?  ?Family History  ?Problem Relation Age of Onset  ? Lung cancer Father   ? Hypertension Father   ? Arthritis Father   ? Other Mother   ?     hardening of the arteries/renal cell carcenoma  ? Hypertension Mother   ? Stroke Mother   ? Kidney cancer Mother   ? Arthritis Brother   ? Rheum arthritis Maternal Uncle   ? Bladder Cancer Neg Hx   ?  ?Past Surgical History:  ?Procedure Laterality Date  ? ABDOMINAL HYSTERECTOMY    ? AMPUTATION Left 12/06/2018  ? Procedure: PARTIAL AMPUTATION LEFT FOOT;  Surgeon: Edrick Kins, DPM;  Location: [place];  Service: Podiatry;  Laterality: Left;  ? AMPUTATION Left 05/16/2019  ? Procedure: LEFT BELOW KNEE AMPUTATION;   Surgeon: Newt Minion, MD;  Location: [place];  Service: Orthopedics;  Laterality: Left;  ? BACK SURGERY  09/02/2015  ? BONE BIOPSY Left 12/06/2018  ? Procedure: Bone Biopsy;  Surgeon: Edrick Kins, DPM;  Loc

## 2021-09-27 ENCOUNTER — Ambulatory Visit: Payer: Medicare Other | Admitting: Family

## 2021-10-05 ENCOUNTER — Other Ambulatory Visit: Payer: Self-pay | Admitting: Family Medicine

## 2021-10-05 DIAGNOSIS — Z1231 Encounter for screening mammogram for malignant neoplasm of breast: Secondary | ICD-10-CM

## 2021-10-14 ENCOUNTER — Telehealth: Payer: Self-pay | Admitting: Family Medicine

## 2021-10-14 NOTE — Telephone Encounter (Signed)
Copied from [place] [id number]. Topic: Medicare AWV ?>> Oct 14, 2021 12:52 PM Cher Nakai R wrote: ?Reason for CRM:  ?Left message for patient to call back and schedule Medicare Annual Wellness Visit (AWV) in office.  ? ?If unable to come into the office for AWV,  please offer to do virtually or by telephone. ? ?Last AWV: 08/24/2020 ? ?Please schedule at anytime with [place]. ? ?30 minute appointment for Virtual or phone ?45 minute appointment for in office or Initial virtual/phone ? ?Any questions, please contact me at [phone] ?

## 2021-10-19 ENCOUNTER — Telehealth: Payer: Self-pay | Admitting: Orthopedic Surgery

## 2021-10-19 NOTE — Telephone Encounter (Signed)
09/22/21 ov note faxed to [facility] [phone] ?

## 2021-10-25 ENCOUNTER — Ambulatory Visit (INDEPENDENT_AMBULATORY_CARE_PROVIDER_SITE_OTHER): Payer: Medicare Other

## 2021-10-25 DIAGNOSIS — Z0000 Encounter for general adult medical examination without abnormal findings: Secondary | ICD-10-CM | POA: Diagnosis not present

## 2021-10-25 NOTE — Patient Instructions (Signed)
[name] , Thank you for taking time to come for your Medicare Wellness Visit. I appreciate your ongoing commitment to your health goals. Please review the following plan we discussed and let me know if I can assist you in the future.   Screening recommendations/referrals: Colonoscopy: done 09/07/21. Repeat 09/2026 Mammogram: done 08/04/20. Scheduled for 11/03/21 Bone Density: due age 66 Recommended yearly ophthalmology/optometry visit for glaucoma screening and checkup Recommended yearly dental visit for hygiene and checkup  Vaccinations: Influenza vaccine: n/a Pneumococcal vaccine: done 01/21/21 Tdap vaccine: done 07/13/15 Shingles vaccine: Shingrix discussed. Please contact your pharmacy for coverage information.  Covid-19: due  Advanced directives: Please bring a copy of your health care power of attorney and living will to the office at your convenience once you have completed that paperwork.   Conditions/risks identified: Keep up the great work!  Next appointment: Follow up in one year for your annual wellness visit.   Preventive Care 40-64 Years, Female Preventive care refers to lifestyle choices and visits with your health care provider that can promote health and wellness. What does preventive care include? A yearly physical exam. This is also called an annual well check. Dental exams once or twice a year. Routine eye exams. Ask your health care provider how often you should have your eyes checked. Personal lifestyle choices, including: Daily care of your teeth and gums. Regular physical activity. Eating a healthy diet. Avoiding tobacco and drug use. Limiting alcohol use. Practicing safe sex. Taking low-dose aspirin daily starting at age 54. Taking vitamin and mineral supplements as recommended by your health care provider. What happens during an annual well check? The services and screenings done by your health care provider during your annual well check will depend on your  age, overall health, lifestyle risk factors, and family history of disease. Counseling  Your health care provider may ask you questions about your: Alcohol use. Tobacco use. Drug use. Emotional well-being. Home and relationship well-being. Sexual activity. Eating habits. Work and work Statistician. Method of birth control. Menstrual cycle. Pregnancy history. Screening  You may have the following tests or measurements: Height, weight, and BMI. Blood pressure. Lipid and cholesterol levels. These may be checked every 5 years, or more frequently if you are over 2 years old. Skin check. Lung cancer screening. You may have this screening every year starting at age 81 if you have a 30-pack-year history of smoking and currently smoke or have quit within the past 15 years. Fecal occult blood test (FOBT) of the stool. You may have this test every year starting at age 61. Flexible sigmoidoscopy or colonoscopy. You may have a sigmoidoscopy every 5 years or a colonoscopy every 10 years starting at age 11. Hepatitis C blood test. Hepatitis B blood test. Sexually transmitted disease (STD) testing. Diabetes screening. This is done by checking your blood sugar (glucose) after you have not eaten for a while (fasting). You may have this done every 1-3 years. Mammogram. This may be done every 1-2 years. Talk to your health care provider about when you should start having regular mammograms. This may depend on whether you have a family history of breast cancer. BRCA-related cancer screening. This may be done if you have a family history of breast, ovarian, tubal, or peritoneal cancers. Pelvic exam and Pap test. This may be done every 3 years starting at age 51. Starting at age 73, this may be done every 5 years if you have a Pap test in combination with an HPV test. Bone  density scan. This is done to screen for osteoporosis. You may have this scan if you are at high risk for osteoporosis. Discuss your test  results, treatment options, and if necessary, the need for more tests with your health care provider. Vaccines  Your health care provider may recommend certain vaccines, such as: Influenza vaccine. This is recommended every year. Tetanus, diphtheria, and acellular pertussis (Tdap, Td) vaccine. You may need a Td booster every 10 years. Zoster vaccine. You may need this after age 16. Pneumococcal 13-valent conjugate (PCV13) vaccine. You may need this if you have certain conditions and were not previously vaccinated. Pneumococcal polysaccharide (PPSV23) vaccine. You may need one or two doses if you smoke cigarettes or if you have certain conditions. Talk to your health care provider about which screenings and vaccines you need and how often you need them. This information is not intended to replace advice given to you by your health care provider. Make sure you discuss any questions you have with your health care provider. Document Released: 06/18/2015 Document Revised: 02/09/2016 Document Reviewed: 03/23/2015 Elsevier Interactive Patient Education  2017 [employer] Prevention in the Home Falls can cause injuries. They can happen to people of all ages. There are many things you can do to make your home safe and to help prevent falls. What can I do on the outside of my home? Regularly fix the edges of walkways and driveways and fix any cracks. Remove anything that might make you trip as you walk through a door, such as a raised step or threshold. Trim any bushes or trees on the path to your home. Use bright outdoor lighting. Clear any walking paths of anything that might make someone trip, such as rocks or tools. Regularly check to see if handrails are loose or broken. Make sure that both sides of any steps have handrails. Any raised decks and porches should have guardrails on the edges. Have any leaves, snow, or ice cleared regularly. Use sand or salt on walking paths during  winter. Clean up any spills in your garage right away. This includes oil or grease spills. What can I do in the bathroom? Use night lights. Install grab bars by the toilet and in the tub and shower. Do not use towel bars as grab bars. Use non-skid mats or decals in the tub or shower. If you need to sit down in the shower, use a plastic, non-slip stool. Keep the floor dry. Clean up any water that spills on the floor as soon as it happens. Remove soap buildup in the tub or shower regularly. Attach bath mats securely with double-sided non-slip rug tape. Do not have throw rugs and other things on the floor that can make you trip. What can I do in the bedroom? Use night lights. Make sure that you have a light by your bed that is easy to reach. Do not use any sheets or blankets that are too big for your bed. They should not hang down onto the floor. Have a firm chair that has side arms. You can use this for support while you get dressed. Do not have throw rugs and other things on the floor that can make you trip. What can I do in the kitchen? Clean up any spills right away. Avoid walking on wet floors. Keep items that you use a lot in easy-to-reach places. If you need to reach something above you, use a strong step stool that has a grab bar. Keep  electrical cords out of the way. Do not use floor polish or wax that makes floors slippery. If you must use wax, use non-skid floor wax. Do not have throw rugs and other things on the floor that can make you trip. What can I do with my stairs? Do not leave any items on the stairs. Make sure that there are handrails on both sides of the stairs and use them. Fix handrails that are broken or loose. Make sure that handrails are as long as the stairways. Check any carpeting to make sure that it is firmly attached to the stairs. Fix any carpet that is loose or worn. Avoid having throw rugs at the top or bottom of the stairs. If you do have throw rugs,  attach them to the floor with carpet tape. Make sure that you have a light switch at the top of the stairs and the bottom of the stairs. If you do not have them, ask someone to add them for you. What else can I do to help prevent falls? Wear shoes that: Do not have high heels. Have rubber bottoms. Are comfortable and fit you well. Are closed at the toe. Do not wear sandals. If you use a stepladder: Make sure that it is fully opened. Do not climb a closed stepladder. Make sure that both sides of the stepladder are locked into place. Ask someone to hold it for you, if possible. Clearly mark and make sure that you can see: Any grab bars or handrails. First and last steps. Where the edge of each step is. Use tools that help you move around (mobility aids) if they are needed. These include: Canes. Walkers. Scooters. Crutches. Turn on the lights when you go into a dark area. Replace any light bulbs as soon as they burn out. Set up your furniture so you have a clear path. Avoid moving your furniture around. If any of your floors are uneven, fix them. If there are any pets around you, be aware of where they are. Review your medicines with your doctor. Some medicines can make you feel dizzy. This can increase your chance of falling. Ask your doctor what other things that you can do to help prevent falls. This information is not intended to replace advice given to you by your health care provider. Make sure you discuss any questions you have with your health care provider. Document Released: 03/18/2009 Document Revised: 10/28/2015 Document Reviewed: 06/26/2014 Elsevier Interactive Patient Education  2017 [employer].

## 2021-10-25 NOTE — Progress Notes (Signed)
Subjective:   Olivia Romero is a 66 y.o. female who presents for Medicare Annual (Subsequent) preventive examination.  Virtual Visit via Telephone Note  I connected with  Olivia Romero on 10/25/21 at 11:15 AM EDT by telephone and verified that I am speaking with the correct person using two identifiers.  Location: Patient: home Provider: [place] Persons participating in the virtual visit: [place]   I discussed the limitations, risks, security and privacy concerns of performing an evaluation and management service by telephone and the availability of in person appointments. The patient expressed understanding and agreed to proceed.  Interactive audio and video telecommunications were attempted between this nurse and patient, however failed, due to patient having technical difficulties OR patient did not have access to video capability.  We continued and completed visit with audio only.  Some vital signs may be absent or patient reported.   Clemetine Marker, LPN   Review of Systems     Cardiac Risk Factors include: advanced age (>69mn, >>46women);diabetes mellitus;dyslipidemia;obesity (BMI >30kg/m2);hypertension     Objective:    There were no vitals filed for this visit. There is no height or weight on file to calculate BMI.     10/25/2021   11:17 AM 09/05/2021    9:18 AM 08/24/2020    9:11 AM 09/22/2019    8:03 AM 08/21/2019    8:55 AM 05/16/2019    6:29 AM 12/04/2018    4:01 PM  Advanced Directives  Does Patient Have a Medical Advance Directive? No No No No No No No  Does patient want to make changes to medical advance directive?   Yes (MAU/Ambulatory/Procedural Areas - Information given)      Would patient like information on creating a medical advance directive? No - Patient declined No - Patient declined  No - Patient declined Yes (MAU/Ambulatory/Procedural Areas - Information given) No - Patient declined Yes (Inpatient - patient defers creating a  medical advance directive at this time - Information given)    Current Medications (verified) Outpatient Encounter Medications as of 10/25/2021  Medication Sig   acetaminophen (TYLENOL) 500 MG tablet Take 1,000 mg by mouth every 6 (six) hours as needed for mild pain. Maximum of 3,000 mg per day   albuterol (VENTOLIN HFA) 108 (90 Base) MCG/ACT inhaler INHALE 2 PUFFS INTO THE LUNGS EVERY 4 HOURS AS NEEDED   allopurinol (ZYLOPRIM) 100 MG tablet Take 1 tablet (100 mg total) by mouth 2 (two) times daily.   aspirin EC 325 MG tablet Take 1 tablet (325 mg total) by mouth daily.   Blood Glucose Monitoring Suppl (CONTOUR NEXT MONITOR) w/Device KIT as directed.   cetirizine (ZYRTEC ALLERGY) 10 MG tablet Take 1 tablet (10 mg total) by mouth daily.   Colchicine 0.6 MG CAPS Take 0.6 mg by mouth 2 (two) times daily as needed.   DULoxetine (CYMBALTA) 60 MG capsule Take 120 mg by mouth daily.   ezetimibe (ZETIA) 10 MG tablet Take 1 tablet (10 mg total) by mouth daily.   fenofibrate (TRICOR) 145 MG tablet TAKE 1 TABLET BY MOUTH ONCE DAILY   gabapentin (NEURONTIN) 100 MG capsule Take by mouth.   gabapentin (NEURONTIN) 800 MG tablet Take 1 tablet by mouth 2 (two) times daily.   glimepiride (AMARYL) 2 MG tablet Take 2 mg by mouth 2 (two) times daily.    hydrochlorothiazide (MICROZIDE) 12.5 MG capsule Take 1 capsule (12.5 mg total) by mouth daily.   Incontinence Supply Disposable (ASSURANCE FITTED BRIEF LARGE)  MISC Use 1 brief up to 6 times a day as needed for urinary incontinence   insulin degludec (TRESIBA) 200 UNIT/ML FlexTouch Pen Inject 20 Units into the skin at bedtime.    mesalamine (LIALDA) 1.2 g EC tablet Take 2 tablets by mouth daily.   metFORMIN (GLUCOPHAGE-XR) 500 MG 24 hr tablet Take 500 mg by mouth 3 (three) times daily.    pantoprazole (PROTONIX) 40 MG tablet Take 1 tablet (40 mg total) by mouth daily as needed (Heartburn).   ULTICARE SHORT PEN NEEDLES 31G X 8 MM MISC    [DISCONTINUED] gabapentin  (NEURONTIN) 400 MG capsule Take 400 mg by mouth 4 (four) times daily.   No facility-administered encounter medications on file as of 10/25/2021.    Allergies (verified) Eggs or egg-derived products, Atorvastatin, Influenza virus vaccine, Latex, Pravastatin, and Tape   History: Past Medical History:  Diagnosis Date   Anemia    Arthritis    Asthma    Colon polyps    adenomatous   Diabetes mellitus without complication ([place])    Diabetic infection of left foot ([place]) 12/2018   Diverticulosis of colon    Esophagitis    GERD (gastroesophageal reflux disease)    Hemorrhoid    internal   Hyperlipemia    Hypertension    IBS (irritable bowel syndrome)    no current prob - diet controlled   Myalgia due to statin 11/19/2017   Neuropathy    Neuropathy of both feet    Neuropathy of hand    Pneumonia    x 4   PONV (postoperative nausea and vomiting)    on some surgeries but not all procedures   Skin ulcer of right ankle, limited to breakdown of skin ([place]) 12/31/2016   resolved per patient 05/14/19   Sleep apnea    has had in the past lost 50 pounds and do longer uses cpap   Statin intolerance 04/23/2015   Stroke ([place]) 2009   mini stroke per patient   Stroke-like episode 2009   TIA - mini stroke per patient   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 07/13/2015   Uncontrolled type 2 diabetes mellitus with hyperglycemia ([place]) 11/01/2017   Past Surgical History:  Procedure Laterality Date   ABDOMINAL HYSTERECTOMY     AMPUTATION Left 12/06/2018   Procedure: PARTIAL AMPUTATION LEFT FOOT;  Surgeon: Edrick Kins, DPM;  Location: [facility];  Service: Podiatry;  Laterality: Left;   AMPUTATION Left 05/16/2019   Procedure: LEFT BELOW KNEE AMPUTATION;  Surgeon: Newt Minion, MD;  Location: [facility];  Service: Orthopedics;  Laterality: Left;   BACK SURGERY  09/02/2015   BONE BIOPSY Left 12/06/2018   Procedure: Bone Biopsy;  Surgeon: Edrick Kins, DPM;  Location: [facility];  Service:  Podiatry;  Laterality: Left;   [place]   COLONOSCOPY N/A 11/30/2012   Procedure: COLONOSCOPY;  Surgeon: Irene Shipper, MD;  Location: [facility] ENDOSCOPY;  Service: Endoscopy;  Laterality: N/A;   COLONOSCOPY N/A 09/05/2021   Procedure: COLONOSCOPY;  Surgeon: Lesly Rubenstein, MD;  Location: [facility] ENDOSCOPY;  Service: Endoscopy;  Laterality: N/A;   COLONOSCOPY WITH PROPOFOL N/A 01/03/2016   Procedure: COLONOSCOPY WITH PROPOFOL;  Surgeon: Manya Silvas, MD;  [facility];  Service: Endoscopy;  Laterality: N/A;   ESOPHAGOGASTRODUODENOSCOPY N/A 09/05/2021   Procedure: ESOPHAGOGASTRODUODENOSCOPY (EGD);  Surgeon: Lesly Rubenstein, MD;  Location: [facility] ENDOSCOPY;  Service: Endoscopy;  Laterality: N/A;   ESOPHAGOGASTRODUODENOSCOPY (  EGD) WITH PROPOFOL N/A 01/14/2015   Procedure: ESOPHAGOGASTRODUODENOSCOPY (EGD) WITH PROPOFOL;  Surgeon: Manya Silvas, MD;  Location: [facility] ENDOSCOPY;  Service: Endoscopy;  Laterality: N/A;   IRRIGATION AND DEBRIDEMENT FOOT Left 03/13/2018   Procedure: IRRIGATION AND DEBRIDEMENT FOOT WITH BONE BIOPSY WITH MISONIX DEBRIDER;  Surgeon: Evelina Bucy, DPM;  Location: [place];  Service: Podiatry;  Laterality: Left;   IRRIGATION AND DEBRIDEMENT FOOT Left 12/06/2018   Procedure: Irrigation And Debridement Foot;  Surgeon: Edrick Kins, DPM;  Location: [facility];  Service: Podiatry;  Laterality: Left;   LEG AMPUTATION BELOW KNEE Left    LUMBAR WOUND DEBRIDEMENT N/A 10/01/2015   Procedure: LUMBAR WOUND DEBRIDEMENT;  Surgeon: Ashok Pall, MD;  Location: [place] NEURO ORS;  Service: Neurosurgery;  Laterality: N/A;  LUMBAR WOUND DEBRIDEMENT   NASAL SINUS SURGERY     SAVORY DILATION N/A 01/14/2015   Procedure: SAVORY DILATION;  Surgeon: Manya Silvas, MD;  Location[facility] ENDOSCOPY;  Service: Endoscopy;  Laterality: N/A;   TONSILLECTOMY     WISDOM TOOTH EXTRACTION     Family History  Problem Relation Age of Onset   Lung cancer Father     Hypertension Father    Arthritis Father    Other Mother        hardening of the arteries/renal cell carcenoma   Hypertension Mother    Stroke Mother    Kidney cancer Mother    Arthritis Brother    Rheum arthritis Maternal Uncle    Bladder Cancer Neg Hx    Social History   Socioeconomic History   Marital status: Single    Spouse name: Not on file   Number of children: 1   Years of education: Not on file   Highest education level: Not on file  Occupational History   Occupation: disabled  Tobacco Use   Smoking status: Never   Smokeless tobacco: Never  Vaping Use   Vaping Use: Never used  Substance and Sexual Activity   Alcohol use: Never   Drug use: No   Sexual activity: Not Currently    Comment: Hysterectomy  Other Topics Concern   Not on file  Social History Narrative   Not on file   Social Determinants of Health   Financial Resource Strain: Low Risk    Difficulty of Paying Living Expenses: Not very hard  Food Insecurity: No Food Insecurity   Worried About Running Out of Food in the Last Year: Never true   Ran Out of Food in the Last Year: Never true  Transportation Needs: No Transportation Needs   Lack of Transportation (Medical): No   Lack of Transportation (Non-Medical): No  Physical Activity: Insufficiently Active   Days of Exercise per Week: 7 days   Minutes of Exercise per Session: 20 min  Stress: No Stress Concern Present   Feeling of Stress : Not at all  Social Connections: Moderately Integrated   Frequency of Communication with Friends and Family: More than three times a week   Frequency of Social Gatherings with Friends and Family: Once a week   Attends Religious Services: More than 4 times per year   Active Member of [employer] or Organizations: Yes   Attends Music therapist: More than 4 times per year   Marital Status: Never married    Tobacco Counseling Counseling given: Not Answered   Clinical Intake:  Pre-visit preparation  completed: Yes  Pain : No/denies pain     Nutritional Risks: None Diabetes: Yes CBG done?: No  Did pt. bring in CBG monitor from home?: No  How often do you need to have someone help you when you read instructions, pamphlets, or other written materials from your doctor or pharmacy?: 1 - Never  Nutrition Risk Assessment:  Has the patient had any N/V/D within the last 2 months?  No  Does the patient have any non-healing wounds?  No  Has the patient had any unintentional weight loss or weight gain?  No   Diabetes:  Is the patient diabetic?  Yes  If diabetic, was a CBG obtained today?  No  Did the patient bring in their glucometer from home?  No  How often do you monitor your CBG's? daily.   Financial Strains and Diabetes Management:  Are you having any financial strains with the device, your supplies or your medication? No .  Does the patient want to be seen by Chronic Care Management for management of their diabetes?  No  Would the patient like to be referred to a Nutritionist or for Diabetic Management?  No   Diabetic Exams:  Diabetic Eye Exam: Completed 07/29/20. Overdue for diabetic eye exam. Pt has been advised about the importance in completing this exam.   Diabetic Foot Exam: Completed 06/07/21.   Interpreter Needed?: No  Information entered by :: Clemetine Marker LPN   Activities of Daily Living    10/25/2021   11:18 AM 07/25/2021   10:54 AM  In your present state of health, do you have any difficulty performing the following activities:  Hearing? 1 0  Vision? 0   Difficulty concentrating or making decisions? 0 0  Walking or climbing stairs? 1 1  Dressing or bathing? 0 1  Doing errands, shopping? 0 0  Preparing Food and eating ? N   Using the Toilet? N   In the past six months, have you accidently leaked urine? N   Do you have problems with loss of bowel control? N   Managing your Medications? N   Managing your Finances? N   Housekeeping or managing your  Housekeeping? N     Patient Care Team: Delsa Grana, PA-C as PCP - General (Family Medicine) Gabriel Carina Betsey Holiday, MD as Physician Assistant (Endocrinology) Vladimir Crofts, MD as Consulting Physician (Neurology) Trula Slade, DPM as Consulting Physician (Podiatry) Newt Minion, MD as Consulting Physician (Orthopedic Surgery) Germaine Pomfret, [facility] (Pharmacist) Skelp, Hugo, LCSW as Social Worker  Indicate any recent Medical Services you may have received from other than [facility] providers in the past year (date may be approximate).     Assessment:   This is a routine wellness examination for [facility].  Hearing/Vision screen Hearing Screening - Comments:: Patient states she needs hearing aids and plans to make appt for evaluation soon Vision Screening - Comments:: Annual vision screenings at [place] issues and exercise activities discussed: Current Exercise Habits: Home exercise routine, Type of exercise: walking, Time (Minutes): 20, Frequency (Times/Week): 7, Weekly Exercise (Minutes/Week): 140, Intensity: Mild, Exercise limited by: orthopedic condition(s);neurologic condition(s)   Goals Addressed   None    Depression Screen    10/25/2021   11:15 AM 07/25/2021   10:54 AM 06/30/2021    1:38 PM 05/18/2021    7:59 AM 02/04/2021    2:02 PM 01/21/2021   10:01 AM 01/21/2021    9:50 AM  PHQ 2/9 Scores  PHQ - 2 Score 0 0 1 0 0 2 2  PHQ- 9 Score  0 1 0  4     Fall Risk    10/25/2021   11:16 AM 07/25/2021   10:54 AM 06/30/2021    1:38 PM 05/18/2021    7:58 AM 01/21/2021    9:45 AM  Fall Risk   Falls in the past year? 1 1 1 1  0  Number falls in past yr: 0 0 1 1 0  Injury with Fall? 0 1 1 1  0  Risk for fall due to : No Fall Risks Impaired balance/gait;Impaired mobility Impaired balance/gait Impaired balance/gait Impaired balance/gait;Impaired mobility  Follow up Falls prevention discussed  Falls prevention discussed Falls prevention discussed     FALL RISK  PREVENTION PERTAINING TO THE HOME:  Any stairs in or around the home? No  If so, are there any without handrails? No  Home free of loose throw rugs in walkways, pet beds, electrical cords, etc? Yes  Adequate lighting in your home to reduce risk of falls? Yes   ASSISTIVE DEVICES UTILIZED TO PREVENT FALLS:  Life alert? No  Use of a cane, walker or w/c? Yes  - forearm crutches Grab bars in the bathroom? No  Shower chair or bench in shower? Yes  Elevated toilet seat or a handicapped toilet? No   TIMED UP AND GO:  Was the test performed? No . Telephonic visit.   Cognitive Function: Normal cognitive status assessed by direct observation by this Nurse Health Advisor. No abnormalities found.          01/31/2017    3:10 PM  6CIT Screen  What Year? 0 points  What month? 0 points  What time? 0 points  Count back from 20 0 points  Months in reverse 0 points  Repeat phrase 8 points  Total Score 8 points    Immunizations Immunization History  Administered Date(s) Administered   Influenza-Unspecified 06/24/2015   PNEUMOCOCCAL CONJUGATE-20 01/21/2021   Pneumococcal Polysaccharide-23 01/20/2014   Tdap 07/13/2015    TDAP status: Up to date  Flu vaccine status: n/a due to allergy  Pneumococcal vaccine status: Up to date  Covid-19 vaccine status: Information provided on how to obtain vaccines.   Qualifies for Shingles Vaccine? Yes   Zostavax completed No   Shingrix Completed?: No.    Education has been provided regarding the importance of this vaccine. Patient has been advised to call insurance company to determine out of pocket expense if they have not yet received this vaccine. Advised may also receive vaccine at local pharmacy or Health Dept. Verbalized acceptance and understanding.  Screening Tests Health Maintenance  Topic Date Due   Zoster Vaccines- Shingrix (1 of 2) Never done   OPHTHALMOLOGY EXAM  07/29/2021   MAMMOGRAM  08/04/2021   HEMOGLOBIN A1C  12/19/2021    FOOT EXAM  06/07/2022   TETANUS/TDAP  07/12/2025   COLONOSCOPY (Pts 45-30yr Insurance coverage will need to be confirmed)  09/08/2026   Hepatitis C Screening  Completed   HIV Screening  Completed   HPV VACCINES  Aged Out   INFLUENZA VACCINE  Discontinued   PAP SMEAR-Modifier  Discontinued   COVID-19 Vaccine  Discontinued    Health Maintenance  Health Maintenance Due  Topic Date Due   Zoster Vaccines- Shingrix (1 of 2) Never done   OPHTHALMOLOGY EXAM  07/29/2021   MAMMOGRAM  08/04/2021    Colorectal cancer screening: Type of screening: Colonoscopy. Completed 09/07/21. Repeat every 5 years.   Mammogram status: Completed 08/04/20. Repeat every year  Bone density status: due age 66 Lung Cancer Screening: (  Low Dose CT Chest recommended if Age 27-80 years, 30 pack-year currently smoking OR have quit w/in 15years.) does not qualify.   Additional Screening:  Hepatitis C Screening: does qualify; Completed 07/12/20  Vision Screening: Recommended annual ophthalmology exams for early detection of glaucoma and other disorders of the eye. Is the patient up to date with their annual eye exam?  No  Who is the provider or what is the name of the office in which the patient attends annual eye exams? [facility].   Dental Screening: Recommended annual dental exams for proper oral hygiene  Community Resource Referral / Chronic Care Management: CRR required this visit?  No   CCM required this visit?  No      Plan:     I have personally reviewed and noted the following in the patient's chart:   Medical and social history Use of alcohol, tobacco or illicit drugs  Current medications and supplements including opioid prescriptions.  Functional ability and status Nutritional status Physical activity Advanced directives List of other physicians Hospitalizations, surgeries, and ER visits in previous 12 months Vitals Screenings to include cognitive, depression, and falls Referrals  and appointments  In addition, I have reviewed and discussed with patient certain preventive protocols, quality metrics, and best practice recommendations. A written personalized care plan for preventive services as well as general preventive health recommendations were provided to patient.     Clemetine Marker, LPN   7/85/8850   Nurse Notes: none

## 2021-11-01 ENCOUNTER — Encounter: Payer: Self-pay | Admitting: *Deleted

## 2021-11-01 ENCOUNTER — Ambulatory Visit (INDEPENDENT_AMBULATORY_CARE_PROVIDER_SITE_OTHER): Payer: Medicare Other | Admitting: *Deleted

## 2021-11-01 ENCOUNTER — Telehealth: Payer: Self-pay

## 2021-11-01 DIAGNOSIS — E1140 Type 2 diabetes mellitus with diabetic neuropathy, unspecified: Secondary | ICD-10-CM

## 2021-11-01 DIAGNOSIS — I10 Essential (primary) hypertension: Secondary | ICD-10-CM

## 2021-11-01 NOTE — Telephone Encounter (Signed)
Copied from [place] [id number]. Topic: General - Inquiry >> Nov 01, 2021  8:26 AM Oneta Rack wrote: Reason for CRM:  Patient requesting [place] transportation to [facility] scheduled for 11/03/2021 at 1:40 pm. Imaging location advised her to call her PCP office to arrange transportation

## 2021-11-01 NOTE — Chronic Care Management (AMB) (Deleted)
Assessment: Assessed patient's previous and current treatment, coping skills, support system and barriers to care.. No Care Plan was developed during this encounter.  Recent life changes Olivia Romero: Patient requesting transportation resources. Per patient, she has an appointment with the [facility] on 11/04/21. Patient   Interventions:{CCM SW CLINICAL INTERVENTIONS:23209}  Review various resources, discussed options and provided patient information about  {CCM/Resource options:25377}  Recommendation: Patient may benefit from, and is in agreement to ***  Follow up Plan: {CCM [place] follow up:25100}

## 2021-11-01 NOTE — Telephone Encounter (Signed)
Olivia Romero

## 2021-11-01 NOTE — Progress Notes (Signed)
This encounter was created in error - please disregard.

## 2021-11-01 NOTE — Chronic Care Management (AMB) (Signed)
Assessment: Assessed patient's previous and current treatment, coping skills, support system and barriers to care.. No Care Plan was developed during this encounter.  Recent life changes [place] Journey: Transportation needs: Patient states need for transportation assistance for an appointment to [place] imaging on 11/03/21. It has been explained that the [facility] Transportation is no longer available. Alternative transportation options discussed including utilizing her transportation benefit through her insurance as well as completing application for Database administrator. Patient confirmed being aware of her insurance benefit, however she is outside of the time frame to arrange. She declined applying for Database administrator. Patient states that she will contact a friend of hers for assistance with transportation to her appointment on 11/03/21 and will contact her insurance benefit as well.  Interventions:Solution-Focused Strategies employed:  Active listening / Reflection utilized  Emotional Support Provided  Review various resources, discussed options and provided patient information about  Transportation provided by insurance provider [facility]) Transportation and Mobility Services  Recommendation: Patient may benefit from applying for Transportation and [employer] in the future to meet her transportation needs. She is in agreement to following up with her insurance plan to meet her future transportation needs and will contact a family friend to assist with transportation to her appointment on 11/03/21.  Follow up Plan: No follow up scheduled with CCM LCSW at this time. Patient will call office if needed.

## 2021-11-02 DIAGNOSIS — Z5982 Transportation insecurity: Secondary | ICD-10-CM

## 2021-11-03 ENCOUNTER — Ambulatory Visit
Admission: RE | Admit: 2021-11-03 | Discharge: 2021-11-03 | Disposition: A | Discharge status: Home / Self Care | Payer: Medicare Other | Source: Ambulatory Visit | Attending: Family Medicine | Admitting: Family Medicine

## 2021-11-03 DIAGNOSIS — Z1231 Encounter for screening mammogram for malignant neoplasm of breast: Secondary | ICD-10-CM | POA: Insufficient documentation

## 2021-11-23 ENCOUNTER — Telehealth: Payer: Self-pay | Admitting: Family Medicine

## 2021-11-23 DIAGNOSIS — G720 Drug-induced myopathy: Secondary | ICD-10-CM | POA: Insufficient documentation

## 2021-11-23 DIAGNOSIS — Z888 Allergy status to other drugs, medicaments and biological substances status: Secondary | ICD-10-CM

## 2021-11-23 DIAGNOSIS — K719 Toxic liver disease, unspecified: Secondary | ICD-10-CM

## 2021-11-23 NOTE — Telephone Encounter (Signed)
Called back number provided no answer left a vm to call back office.

## 2021-11-23 NOTE — Telephone Encounter (Signed)
Annie pharmacist wants to know if you would approve for her to be on a statin medication. Please advice

## 2021-11-23 NOTE — Telephone Encounter (Signed)
Copied from [place]. Topic: General - Inquiry >> Nov 23, 2021  2:49 PM Erskine Squibb wrote: Reason for CRM: Altha Harm with [employer] called in inquiring about an initial fax she states was sent in to the providers office in March. I did not see anything in the patients chart. She stated she will refax this. She stated they are recommending patient be started on a type of statin to reduce cardio risk due to diabetes. Her call back number is [phone] and fax number is [phone]. Please assist further.

## 2021-11-24 NOTE — Telephone Encounter (Signed)
Called back number given and Spoke to Sarah pharmacist relayed Kristeen Miss message- she does not approve statin medication.

## 2021-11-28 ENCOUNTER — Ambulatory Visit: Payer: Medicare Other | Admitting: Orthopedic Surgery

## 2021-11-28 ENCOUNTER — Encounter: Payer: Self-pay | Admitting: Orthopedic Surgery

## 2021-11-28 DIAGNOSIS — M216X1 Other acquired deformities of right foot: Secondary | ICD-10-CM | POA: Diagnosis not present

## 2021-11-28 DIAGNOSIS — Z89512 Acquired absence of left leg below knee: Secondary | ICD-10-CM

## 2021-11-28 DIAGNOSIS — M25871 Other specified joint disorders, right ankle and foot: Secondary | ICD-10-CM | POA: Diagnosis not present

## 2021-11-28 DIAGNOSIS — M5416 Radiculopathy, lumbar region: Secondary | ICD-10-CM | POA: Diagnosis not present

## 2021-11-28 MED ORDER — METHYLPREDNISOLONE ACETATE 40 MG/ML IJ SUSP
40.0000 mg | INTRAMUSCULAR | Status: AC | PRN
  Administered 2021-11-28: 40 mg via INTRA_ARTICULAR

## 2021-11-28 MED ORDER — LIDOCAINE HCL 1 % IJ SOLN
2.0000 mL | INTRAMUSCULAR | Status: AC | PRN
  Administered 2021-11-28: 2 mL

## 2021-11-30 LAB — HEMOGLOBIN A1C: Hemoglobin A1C: 9.6

## 2021-11-30 LAB — MICROALBUMIN / CREATININE URINE RATIO: Microalb Creat Ratio: 63.5

## 2021-12-14 ENCOUNTER — Other Ambulatory Visit: Payer: Self-pay | Admitting: Neurosurgery

## 2021-12-14 DIAGNOSIS — M48061 Spinal stenosis, lumbar region without neurogenic claudication: Secondary | ICD-10-CM

## 2021-12-21 ENCOUNTER — Telehealth: Payer: Self-pay | Admitting: Family Medicine

## 2021-12-21 NOTE — Telephone Encounter (Signed)
Patient has an appointment scheduled with Delsa Grana, PA on 7/24 at 9:20. She is requesting transportation. Would like a call back please.

## 2021-12-23 ENCOUNTER — Ambulatory Visit: Payer: Self-pay | Admitting: *Deleted

## 2021-12-23 ENCOUNTER — Telehealth: Payer: Self-pay

## 2021-12-23 ENCOUNTER — Other Ambulatory Visit: Payer: Self-pay

## 2021-12-23 ENCOUNTER — Ambulatory Visit (INDEPENDENT_AMBULATORY_CARE_PROVIDER_SITE_OTHER): Payer: Medicare Other | Admitting: Nurse Practitioner

## 2021-12-23 ENCOUNTER — Encounter: Payer: Self-pay | Admitting: Nurse Practitioner

## 2021-12-23 VITALS — BP 136/72 | HR 97 | Temp 98.1°F | Resp 16 | Ht 60.0 in | Wt 197.2 lb

## 2021-12-23 DIAGNOSIS — Z5982 Transportation insecurity: Secondary | ICD-10-CM

## 2021-12-23 DIAGNOSIS — K625 Hemorrhage of anus and rectum: Secondary | ICD-10-CM | POA: Diagnosis not present

## 2021-12-23 DIAGNOSIS — K649 Unspecified hemorrhoids: Secondary | ICD-10-CM | POA: Diagnosis not present

## 2021-12-23 LAB — CBC WITH DIFFERENTIAL/PLATELET
Absolute Monocytes: 670 cells/uL (ref 200–950)
Basophils Absolute: 102 cells/uL (ref 0–200)
Basophils Relative: 1.1 %
Eosinophils Absolute: 251 cells/uL (ref 15–500)
Eosinophils Relative: 2.7 %
HCT: 41.1 % (ref 35.0–45.0)
Hemoglobin: 13.3 g/dL (ref 11.7–15.5)
Lymphs Abs: 2920 cells/uL (ref 850–3900)
MCH: 27.2 pg (ref 27.0–33.0)
MCHC: 32.4 g/dL (ref 32.0–36.0)
MCV: 84 fL (ref 80.0–100.0)
MPV: 11.6 fL (ref 7.5–12.5)
Monocytes Relative: 7.2 %
Neutro Abs: 5357 cells/uL (ref 1500–7800)
Neutrophils Relative %: 57.6 %
Platelets: 336 10*3/uL (ref 140–400)
RBC: 4.89 10*6/uL (ref 3.80–5.10)
RDW: 14.3 % (ref 11.0–15.0)
Total Lymphocyte: 31.4 %
WBC: 9.3 10*3/uL (ref 3.8–10.8)

## 2021-12-23 MED ORDER — DOCUSATE SODIUM 100 MG PO CAPS: 100.0000 mg | ORAL_CAPSULE | Freq: Two times a day (BID) | ORAL | 0 refills | Status: DC

## 2021-12-23 MED ORDER — HYDROCORTISONE ACETATE 25 MG RE SUPP: 25.0000 mg | Freq: Two times a day (BID) | RECTAL | 0 refills | Status: DC

## 2021-12-23 NOTE — Telephone Encounter (Signed)
   Telephone encounter was:  Unsuccessful.  12/23/2021 Name: AJWA KIMBERLEY MRN: [id number] DOB: 08-19-1955  Unsuccessful outbound call made today to assist with:  Transportation Needs   Outreach Attempt:  1st Attempt  A HIPAA compliant voice message was left requesting a return call.  Instructed patient to call back at earliest convenience. [place], Care Management  [phone] [address], [place], [place] [address] Phone: [phone] Email: [email or web address]

## 2021-12-23 NOTE — Progress Notes (Signed)
BP 136/72   Pulse 97   Temp 98.1 F (36.7 C) (Oral)   Resp 16   Ht 5' (1.524 m)   Wt 197 lb 3.2 oz (89.4 kg)   LMP  (LMP Unknown)   SpO2 98%   BMI 38.51 kg/m    Subjective:    Patient ID: Olivia Romero, female    DOB: May 15, 1956, 66 y.o.   MRN: [id number]  HPI: Olivia Romero is a 66 y.o. female  Chief Complaint  Patient presents with   Rectal Bleeding    For 5 days   Rectal bleeding: Patient reports that she noticed some rectal bleeding 5 days ago.  Patient states she noted darkish red blood.  Patient states it was not only with bowel movements she noted it in her pants.  Dr. Haig Prophet is her gastroenterologist.  Patient recently had a colonoscopy on 09/05/2021.  This did show that she did have an internal hemorrhoid grade 1.  Upon inspection I could see an external hemorrhoid with a scab that looked where it had been bleeding.  Discussed with patient that we would get a CBC due to blood loss.  We will send in prescription for hydrocortisone suppository.  Gust with patient about increasing fiber intake drinking plenty of fluids and taking a stool softener.  Discussed with patient about contacting Dr. Haig Prophet her gastroenterologist for follow-up and further treatment.  Relevant past medical, surgical, family and social history reviewed and updated as indicated. Interim medical history since our last visit reviewed. Allergies and medications reviewed and updated.  Review of Systems  Constitutional: Negative for fever or weight change.  Respiratory: Negative for cough and shortness of breath.   Cardiovascular: Negative for chest pain or palpitations.  Gastrointestinal: Negative for abdominal pain, no bowel changes. Postive for rectal bleeding Musculoskeletal: Negative for gait problem or joint swelling.  Skin: Negative for rash.  Neurological: Negative for dizziness or headache.  No other specific complaints in a complete review of systems (except as listed in HPI above).       Objective:    BP 136/72   Pulse 97   Temp 98.1 F (36.7 C) (Oral)   Resp 16   Ht 5' (1.524 m)   Wt 197 lb 3.2 oz (89.4 kg)   LMP  (LMP Unknown)   SpO2 98%   BMI 38.51 kg/m   Wt Readings from Last 3 Encounters:  12/23/21 197 lb 3.2 oz (89.4 kg)  09/05/21 184 lb (83.5 kg)  07/25/21 187 lb (84.8 kg)    Physical Exam Constitutional: Patient appears well-developed and well-nourished. Obese  No distress.  HEENT: head atraumatic, normocephalic, pupils equal and reactive to light, neck supple Cardiovascular: Normal rate, regular rhythm and normal heart sounds.  No murmur heard. No BLE edema. Pulmonary/Chest: Effort normal and breath sounds normal. No respiratory distress. Abdominal: Soft.  There is no tenderness. Rectum: Hemorid noted with scab present Psychiatric: Patient has a normal mood and affect. behavior is normal. Judgment and thought content normal.  Results for orders placed or performed in visit on 10/25/21  Hemoglobin A1c  Result Value Ref Range   Hemoglobin A1C 8.7       Assessment & Plan:   Problem List Items Addressed This Visit   None Visit Diagnoses     Rectal bleed    -  Primary   Rectal bleeding likely due to hemorrhoid.  Patient to follow-up with Dr. Haig Prophet GI.  We will start on Colace, hydrocortisone suppository.  Relevant Orders   CBC w/Diff/Platelet   Bleeding hemorrhoid        Patient to follow-up with Dr. Haig Prophet GI.  We will start on Colace, hydrocortisone suppository.  Increase fiber and increase water intake.   Relevant Medications   hydrocortisone (ANUSOL-HC) 25 MG suppository   docusate sodium (COLACE) 100 MG capsule        Follow up plan: Return for follow up with GI.

## 2021-12-26 ENCOUNTER — Ambulatory Visit: Payer: Medicare Other | Admitting: Family Medicine

## 2021-12-26 ENCOUNTER — Telehealth: Payer: Self-pay

## 2021-12-26 ENCOUNTER — Encounter: Payer: Self-pay | Admitting: Orthopedic Surgery

## 2021-12-26 ENCOUNTER — Ambulatory Visit: Payer: Medicare Other | Admitting: Orthopedic Surgery

## 2021-12-26 DIAGNOSIS — M25871 Other specified joint disorders, right ankle and foot: Secondary | ICD-10-CM | POA: Diagnosis not present

## 2021-12-26 NOTE — Progress Notes (Signed)
Office Visit Note   Patient: Olivia Romero           Date of Birth: 09/25/1955           MRN: [id number] Visit Date: 12/26/2021              Requested by: Delsa Grana, PA-C [address] [place],  [place] [address] PCP: Delsa Grana, PA-C  Chief Complaint  Patient presents with   Right Ankle - Pain, Follow-up      HPI: Patient is a 66 year old woman who is seen in follow-up status post steroid injection right ankle for impingement symptoms on 11/28/2021.  Patient wears a lace up leather brace and she states the injection has helped significantly.  Assessment & Plan: Visit Diagnoses:  1. Impingement syndrome of right ankle     Plan: Continue with the leather brace and [place] crutches.  Patient has follow-up appoint with neurosurgery for her back.  Follow-Up Instructions: Return if symptoms worsen or fail to improve.   Ortho Exam  Patient is alert, oriented, no adenopathy, well-dressed, normal affect, normal respiratory effort. Examination of the right ankle there is no redness no cellulitis no signs of infection there were no ulcers she does have a cavovarus foot with a varus hindfoot.  No plantar ulcers.  Imaging: No results found. No images are attached to the encounter.  Labs: Lab Results  Component Value Date   HGBA1C 8.7 06/21/2021   HGBA1C 7.2 (H) 01/21/2021   HGBA1C 7.5 07/01/2020   ESRSEDRATE 60 (H) 12/05/2018   ESRSEDRATE 41 (H) 12/04/2018   ESRSEDRATE 30 07/12/2018   CRP 22.0 (H) 12/05/2018   CRP 20 (H) 12/04/2018   CRP 6 07/12/2018   LABURIC 5.4 04/22/2020   LABURIC 8.7 (H) 03/25/2020   LABURIC 8.2 (H) 11/30/2015   REPTSTATUS 12/12/2018 FINAL 12/06/2018   GRAMSTAIN  12/06/2018    RARE WBC PRESENT, PREDOMINANTLY PMN NO ORGANISMS SEEN    CULT  12/06/2018    RARE METHICILLIN RESISTANT STAPHYLOCOCCUS AUREUS WITH NORMAL SKIN FLORA NO ANAEROBES ISOLATED Performed at [facility], [place] [address] [place], [place] [address]     LABORGA METHICILLIN RESISTANT STAPHYLOCOCCUS AUREUS 12/06/2018     Lab Results  Component Value Date   ALBUMIN 3.7 12/04/2018   ALBUMIN 4.0 11/27/2018   ALBUMIN 3.8 03/11/2018   PREALBUMIN 21.5 03/12/2018    No results found for: "MG" Lab Results  Component Value Date   VD25OH 11 (L) 09/20/2016    Lab Results  Component Value Date   PREALBUMIN 21.5 03/12/2018      Latest Ref Rng & Units 12/23/2021    3:58 PM 01/21/2021   10:42 AM 07/12/2020   11:03 AM  CBC EXTENDED  WBC 3.8 - 10.8 Thousand/uL 9.3  9.1  7.6   RBC 3.80 - 5.10 Million/uL 4.89  4.68  4.40   Hemoglobin 11.7 - 15.5 g/dL 13.3  11.6  11.3   HCT 35.0 - 45.0 % 41.1  37.7  35.6   Platelets 140 - 400 Thousand/uL 336  330  344   NEUT# 1,500 - 7,800 cells/uL 5,357  5,296  4,218   Lymph# 850 - 3,900 cells/uL 2,920  2,803  2,622      There is no height or weight on file to calculate BMI.  Orders:  No orders of the defined types were placed in this encounter.  No orders of the defined types were placed in this encounter.    Procedures: No  procedures performed  Clinical Data: No additional findings.  ROS:  All other systems negative, except as noted in the HPI. Review of Systems  Objective: Vital Signs: LMP  (LMP Unknown)   Specialty Comments:  No specialty comments available.  PMFS History: Patient Active Problem List   Diagnosis Date Noted   Statin myopathy 11/23/2021   Left lower quadrant abdominal tenderness without rebound tenderness 08/03/2021   History of colitis 08/03/2021   Change in bowel habits 08/03/2021   Generalized abdominal pain 08/03/2021   Anemia 01/21/2021   Urticaria 10/21/2020   Rash and nonspecific skin eruption 07/27/2020   Hepatotoxicity due to statin drug 07/15/2020   Vitamin D deficiency 07/12/2020   Chronic low back pain 07/12/2020   BMI 36.0-36.9,adult 12/29/2019   Sepsis without acute organ dysfunction ([place]) 12/29/2019   Post-operative state 12/29/2019   Asthma  12/29/2019   Cervical disc disorder 12/29/2019   Displacement of cervical intervertebral disc 12/29/2019   Abnormal posture 12/29/2019   Brachial radiculitis 12/29/2019   Neck pain 12/29/2019   Pain in upper limb 12/29/2019   Whiplash injury to neck 12/29/2019   Allergy to statin medication 12/01/2019   History of below knee amputation, left ([place]) 07/30/2019   Gangrene of left foot ([place]) 05/16/2019   Charcot's joint of foot, left    PAD (peripheral artery disease) ([place]) 12/05/2018   MRSA bacteremia 12/05/2018   Charcot's joint of foot due to diabetes ([place]) 09/18/2018   Class 2 severe obesity with serious comorbidity and body mass index (BMI) of 37.0 to 37.9 in adult ([place]) 11/19/2017   B12 deficiency 11/01/2017   Diabetic polyneuropathy associated with type 2 diabetes mellitus ([place]) 12/13/2015   Lumbar stenosis with neurogenic claudication 09/02/2015   Chronic constipation 07/13/2015   OSA (obstructive sleep apnea) 06/25/2015   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/23/2015   Hyperlipidemia 04/23/2015   Statin intolerance 04/23/2015   Asthma, mild intermittent 02/01/2015   Type 2 diabetes mellitus with diabetic neuropathy, with long-term current use of insulin ([place]) 12/31/2014   Gastroesophageal reflux disease with esophagitis 12/31/2014   Dysphagia 12/31/2014   Bilateral carpal tunnel syndrome 12/01/2014   Diverticulosis of colon 11/30/2012   Benign essential hypertension 11/28/2012   Past Medical History:  Diagnosis Date   Anemia    Arthritis    Asthma    Colon polyps    adenomatous   Diabetes mellitus without complication ([place])    Diabetic infection of left foot ([place]) 12/2018   Diverticulosis of colon    Esophagitis    GERD (gastroesophageal reflux disease)    Hemorrhoid    internal   Hyperlipemia    Hypertension    IBS (irritable bowel syndrome)    no current prob - diet controlled   Myalgia due to statin 11/19/2017   Neuropathy    Neuropathy of both  feet    Neuropathy of hand    Pneumonia    x 4   PONV (postoperative nausea and vomiting)    on some surgeries but not all procedures   Skin ulcer of right ankle, limited to breakdown of skin ([place]) 12/31/2016   resolved per patient 05/14/19   Sleep apnea    has had in the past lost 50 pounds and do longer uses cpap   Statin intolerance 04/23/2015   Stroke ([place]) 2009   mini stroke per patient   Stroke-like episode 2009   TIA - mini stroke per patient   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 07/13/2015  Uncontrolled type 2 diabetes mellitus with hyperglycemia ([place]) 11/01/2017    Family History  Problem Relation Age of Onset   Lung cancer Father    Hypertension Father    Arthritis Father    Other Mother        hardening of the arteries/renal cell carcenoma   Hypertension Mother    Stroke Mother    Kidney cancer Mother    Arthritis Brother    Rheum arthritis Maternal Uncle    Bladder Cancer Neg Hx     Past Surgical History:  Procedure Laterality Date   ABDOMINAL HYSTERECTOMY     AMPUTATION Left 12/06/2018   Procedure: PARTIAL AMPUTATION LEFT FOOT;  Surgeon: Edrick Kins, DPM;  Location: [facility];  Service: Podiatry;  Laterality: Left;   AMPUTATION Left 05/16/2019   Procedure: LEFT BELOW KNEE AMPUTATION;  Surgeon: Newt Minion, MD;  Location: [facility];  Service: Orthopedics;  Laterality: Left;   BACK SURGERY  09/02/2015   BONE BIOPSY Left 12/06/2018   Procedure: Bone Biopsy;  Surgeon: Edrick Kins, DPM;  Location: [facility];  Service: Podiatry;  Laterality: Left;   [place]   COLONOSCOPY N/A 11/30/2012   Procedure: COLONOSCOPY;  Surgeon: Irene Shipper, MD;  Location: [facility] ENDOSCOPY;  Service: Endoscopy;  Laterality: N/A;   COLONOSCOPY N/A 09/05/2021   Procedure: COLONOSCOPY;  Surgeon: Lesly Rubenstein, MD;  Location: [facility] ENDOSCOPY;  Service: Endoscopy;  Laterality: N/A;   COLONOSCOPY WITH PROPOFOL N/A 01/03/2016   Procedure:  COLONOSCOPY WITH PROPOFOL;  Surgeon: Manya Silvas, MD;  Location: [facility]COPY;  Service: Endoscopy;  Laterality: N/A;   ESOPHAGOGASTRODUODENOSCOPY N/A 09/05/2021   Procedure: ESOPHAGOGASTRODUODENOSCOPY (EGD);  Surgeon: Lesly Rubenstein, MD;  Location[facility] ENDOSCOPY;  Service: Endoscopy;  Laterality: N/A;   ESOPHAGOGASTRODUODENOSCOPY (EGD) WITH PROPOFOL N/A 01/14/2015   Procedure: ESOPHAGOGASTRODUODENOSCOPY (EGD) WITH PROPOFOL;  Surgeon: Manya Silvas, MD;  Location[facility]COPY;  Service: Endoscopy;  Laterality: N/A;   IRRIGATION AND DEBRIDEMENT FOOT Left 03/13/2018   Procedure: IRRIGATION AND DEBRIDEMENT FOOT WITH BONE BIOPSY WITH MISONIX DEBRIDER;  Surgeon: Evelina Bucy, DPM;  Location: [facility];  Service: Podiatry;  Laterality: Left;   IRRIGATION AND DEBRIDEMENT FOOT Left 12/06/2018   Procedure: Irrigation And Debridement Foot;  Surgeon: Edrick Kins, DPM;  Location: [facility];  Service: Podiatry;  Laterality: Left;   LEG AMPUTATION BELOW KNEE Left    LUMBAR WOUND DEBRIDEMENT N/A 10/01/2015   Procedure: LUMBAR WOUND DEBRIDEMENT;  Surgeon: Ashok Pall, MD;  Location: [facility] NEURO ORS;  Service: Neurosurgery;  Laterality: N/A;  LUMBAR WOUND DEBRIDEMENT   NASAL SINUS SURGERY     SAVORY DILATION N/A 01/14/2015   Procedure: SAVORY DILATION;  Surgeon: Manya Silvas, MD;  Location: [facility] ENDOSCOPY;  Service: Endoscopy;  Laterality: N/A;   TONSILLECTOMY     WISDOM TOOTH EXTRACTION     Social History   Occupational History   Occupation: disabled  Tobacco Use   Smoking status: Never   Smokeless tobacco: Never  Vaping Use   Vaping Use: Never used  Substance and Sexual Activity   Alcohol use: Never   Drug use: No   Sexual activity: Not Currently    Comment: Hysterectomy

## 2021-12-26 NOTE — Telephone Encounter (Signed)
   Telephone encounter was:  Unsuccessful.  12/26/2021 Name: Olivia Romero MRN: [id number] DOB: 1955/09/21  Unsuccessful outbound call made today to assist with:  Transportation Needs   Outreach Attempt:  2nd Attempt  A HIPAA compliant voice message was left requesting a return call.  Instructed patient to call back at earliest convenience. [place], Care Management  [phone] [address], [place], [place] [address] Phone: [phone] Email: [email or web address]

## 2021-12-28 ENCOUNTER — Telehealth: Payer: Self-pay

## 2021-12-28 NOTE — Telephone Encounter (Signed)
   Telephone encounter was:  Successful.  12/28/2021 Name: VICTORA IRBY MRN: [id number] DOB: 1955/12/01  KRIS BURD is a 66 y.o. year old female who is a primary care patient of Delsa Grana, [place] . The community resource team was consulted for assistance with Transportation Needs   Care guide performed the following interventions: Patient provided with information about care guide support team and interviewed to confirm resource needs.Patient stated she needs other transportation resources because she is almost out of rides with [facility]. I have mailed resources to the patient  Follow Up Plan:  No further follow up planned at this time. The patient has been provided with needed resources.    [place], Care Management  [phone] [address], [place], [place] [address] Phone: [phone] Email: [email or web address]

## 2021-12-29 ENCOUNTER — Ambulatory Visit
Admission: RE | Admit: 2021-12-29 | Discharge: 2021-12-29 | Disposition: A | Discharge status: Home / Self Care | Payer: Medicare Other | Source: Ambulatory Visit | Attending: Neurosurgery | Admitting: Neurosurgery

## 2021-12-29 DIAGNOSIS — M48061 Spinal stenosis, lumbar region without neurogenic claudication: Secondary | ICD-10-CM | POA: Diagnosis present

## 2022-01-02 ENCOUNTER — Other Ambulatory Visit: Payer: Self-pay | Admitting: Surgery

## 2022-01-02 DIAGNOSIS — N289 Disorder of kidney and ureter, unspecified: Secondary | ICD-10-CM

## 2022-01-05 ENCOUNTER — Other Ambulatory Visit: Payer: Self-pay | Admitting: Neurosurgery

## 2022-01-05 DIAGNOSIS — N281 Cyst of kidney, acquired: Secondary | ICD-10-CM

## 2022-01-10 ENCOUNTER — Ambulatory Visit
Admission: RE | Admit: 2022-01-10 | Discharge: 2022-01-10 | Disposition: A | Discharge status: Home / Self Care | Payer: Medicare Other | Source: Ambulatory Visit | Attending: Surgery | Admitting: Surgery

## 2022-01-10 DIAGNOSIS — N281 Cyst of kidney, acquired: Secondary | ICD-10-CM | POA: Diagnosis not present

## 2022-01-10 DIAGNOSIS — N289 Disorder of kidney and ureter, unspecified: Secondary | ICD-10-CM

## 2022-01-19 ENCOUNTER — Telehealth: Payer: Self-pay | Admitting: Family Medicine

## 2022-01-19 NOTE — Telephone Encounter (Signed)
Copied from [place] [id number]. Topic: Appointment Scheduling - Scheduling Inquiry for Clinic >> Jan 19, 2022 11:45 AM Olivia Romero wrote: Reason for CRM: Pt will needs transportation for her visit on the 22nd.

## 2022-01-20 ENCOUNTER — Ambulatory Visit: Payer: Self-pay | Admitting: *Deleted

## 2022-01-20 ENCOUNTER — Ambulatory Visit (INDEPENDENT_AMBULATORY_CARE_PROVIDER_SITE_OTHER): Payer: Medicare Other | Admitting: Surgical

## 2022-01-20 ENCOUNTER — Ambulatory Visit: Payer: Self-pay

## 2022-01-20 DIAGNOSIS — T888XXA Other specified complications of surgical and medical care, not elsewhere classified, initial encounter: Secondary | ICD-10-CM | POA: Diagnosis not present

## 2022-01-20 DIAGNOSIS — T8789 Other complications of amputation stump: Secondary | ICD-10-CM

## 2022-01-20 DIAGNOSIS — Z89512 Acquired absence of left leg below knee: Secondary | ICD-10-CM

## 2022-01-20 DIAGNOSIS — M25462 Effusion, left knee: Secondary | ICD-10-CM

## 2022-01-20 DIAGNOSIS — S80822A Blister (nonthermal), left lower leg, initial encounter: Secondary | ICD-10-CM

## 2022-01-20 MED ORDER — DOXYCYCLINE HYCLATE 100 MG PO TABS: 100.0000 mg | ORAL_TABLET | Freq: Two times a day (BID) | ORAL | 0 refills | Status: DC

## 2022-01-20 NOTE — Patient Outreach (Signed)
[facility]) Care Management  01/20/2022  Olivia Romero 10-05-1955 [id number]   Phone call to patient to discuss transportation needs to medical appointments. Voicemail left requesting a return call.  [name], [place] Worker  [facility] Care Management [phone]

## 2022-01-22 ENCOUNTER — Encounter: Payer: Self-pay | Admitting: Surgical

## 2022-01-22 MED ORDER — LIDOCAINE HCL 1 % IJ SOLN
5.0000 mL | INTRAMUSCULAR | Status: AC | PRN
  Administered 2022-01-20: 5 mL

## 2022-01-22 NOTE — Progress Notes (Signed)
Office Visit Note   Patient: Olivia Romero           Date of Birth: February 17, 1956           MRN: [id number] Visit Date: 01/20/2022 Requested by: Delsa Grana, PA-C [address] [place],  [place] [address] PCP: Delsa Grana, PA-C  Subjective: Chief Complaint  Patient presents with   Left Lower Leg - Pain    Pressure left BKA with putting on prosthetic leg and then bearing weight on the left leg.    HPI: Olivia Romero is a 66 y.o. female who presents to the office complaining of left leg pain.  Patient has history of left below-knee amputation was done by Dr. Sharol Given in December 2020.  She states that she awoke last Friday with fairly abrupt onset of moderate pain in the left leg around the end of her stump that has prevented her from being able to ambulate with her prosthetic.  She feels it is tender at the end of the stump since that day.  She denies any fevers, chills, night sweats, feeling ill.  Temperature has been running around 99.  She was evaluated at [facility] where she was informed that her stump is larger than usual and is not really fitting appropriately into her prosthetic leg.  She does have a history of diabetes which is currently uncontrolled with last A1c 9.6.  Also has a history of gout and has taken allopurinol for this in the past but not recently..                ROS: All systems reviewed are negative as they relate to the chief complaint within the history of present illness.  Patient denies fevers or chills.  Assessment & Plan: Visit Diagnoses:  1. Fluid collection at surgical site, initial encounter   2. Hx of BKA, left ([place])     Plan: Patient is a 66 year old female who presents for evaluation of left BKA stump pain.  Has history of BKA in December 2020 by Dr. Sharol Given.  Has done well with this and has been ambulatory with prosthetic leg until abrupt onset of pain about a week ago.  She woke up with pain onset in the morning and has had difficulty with  ambulation ever since.  No obvious evidence of infection on exam today with lack of significant redness, ulceration, sinus tract, warmth.  She did have ultrasound applied to the lower extremity which revealed cobblestone appearance to the subcutaneous tissues with small fluid collection underneath.  This fluid was aspirated under ultrasound guidance with about 12 cc of dark, yellow but not necessarily purulent fluid.  This fluid will be sent off for synovial fluid analysis crystal analysis and culture.  Place her on doxycycline for some protection in case this is infectious.  May be gout given the abrupt onset of symptoms and lack of other signs of infection.  She was given close parameters for contacting the office on-call number or going to the ER including worsening symptoms, increasing redness, systemic signs of infection such as fevers, chills, night sweats.  She will follow-up with Dr. Sharol Given next week and further management depends on aspiration results.  Follow-Up Instructions: No follow-ups on file.   Orders:  Orders Placed This Encounter  Procedures   Anaerobic and Aerobic Culture   US Guided Needle Placement - No Linked Charges   Synovial Fluid Analysis, Complete   Synovial fluid, crystal   Meds ordered this encounter  Medications  doxycycline (VIBRA-TABS) 100 MG tablet    Sig: Take 1 tablet (100 mg total) by mouth 2 (two) times daily.    Dispense:  14 tablet    Refill:  0      Procedures: Aspiration of fluid collection in left BKA stump on 01/20/2022 11:25 AM Indications: diagnostic evaluation, joint swelling and pain Details: 18 G 1.5 in needle, ultrasound-guided superolateral approach  Arthrogram: No  Medications: 5 mL lidocaine 1 % Aspirate: 12 mL yellow; sent for lab analysis Outcome: tolerated well, no immediate complications Procedure, treatment alternatives, risks and benefits explained, specific risks discussed. Consent was given by the patient. Immediately prior to  procedure a time out was called to verify the correct patient, procedure, equipment, support staff and site/side marked as required. Patient was prepped and draped in the usual sterile fashion.       Clinical Data: No additional findings.  Objective: Vital Signs: LMP  (LMP Unknown)   Physical Exam:  Constitutional: Patient appears well-developed HEENT:  Head: Normocephalic Eyes:EOM are normal Neck: Normal range of motion Cardiovascular: Normal rate Pulmonary/chest: Effort normal Neurologic: Patient is alert Skin: Skin is warm Psychiatric: Patient has normal mood and affect  Ortho Exam: Ortho exam demonstrates left leg with BKA stump that is fairly diffusely tender to palpation.  There is no asymmetric warmth compared with the contralateral leg.  No significant redness and no sinus tract or trouble with the BKA incision.  Any redness that she does have completely resolves with elevation of the extremity.  She is able to flex and extend her left knee without pain.  There is no knee effusion today.  Specialty Comments:  No specialty comments available.  Imaging: No results found.   PMFS History: Patient Active Problem List   Diagnosis Date Noted   Statin myopathy 11/23/2021   Left lower quadrant abdominal tenderness without rebound tenderness 08/03/2021   History of colitis 08/03/2021   Change in bowel habits 08/03/2021   Generalized abdominal pain 08/03/2021   Anemia 01/21/2021   Urticaria 10/21/2020   Rash and nonspecific skin eruption 07/27/2020   Hepatotoxicity due to statin drug 07/15/2020   Vitamin D deficiency 07/12/2020   Chronic low back pain 07/12/2020   BMI 36.0-36.9,adult 12/29/2019   Sepsis without acute organ dysfunction ([place]) 12/29/2019   Post-operative state 12/29/2019   Asthma 12/29/2019   Cervical disc disorder 12/29/2019   Displacement of cervical intervertebral disc 12/29/2019   Abnormal posture 12/29/2019   Brachial radiculitis 12/29/2019    Neck pain 12/29/2019   Pain in upper limb 12/29/2019   Whiplash injury to neck 12/29/2019   Allergy to statin medication 12/01/2019   History of below knee amputation, left ([place]) 07/30/2019   Gangrene of left foot ([place]) 05/16/2019   Charcot's joint of foot, left    PAD (peripheral artery disease) ([place]) 12/05/2018   MRSA bacteremia 12/05/2018   Charcot's joint of foot due to diabetes ([place]) 09/18/2018   Class 2 severe obesity with serious comorbidity and body mass index (BMI) of 37.0 to 37.9 in adult ([place]) 11/19/2017   B12 deficiency 11/01/2017   Diabetic polyneuropathy associated with type 2 diabetes mellitus ([place]) 12/13/2015   Lumbar stenosis with neurogenic claudication 09/02/2015   Chronic constipation 07/13/2015   OSA (obstructive sleep apnea) 06/25/2015   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/23/2015   Hyperlipidemia 04/23/2015   Statin intolerance 04/23/2015   Asthma, mild intermittent 02/01/2015   Type 2 diabetes mellitus with diabetic neuropathy, with long-term current use of insulin ([place])  12/31/2014   Gastroesophageal reflux disease with esophagitis 12/31/2014   Dysphagia 12/31/2014   Bilateral carpal tunnel syndrome 12/01/2014   Diverticulosis of colon 11/30/2012   Benign essential hypertension 11/28/2012   Past Medical History:  Diagnosis Date   Anemia    Arthritis    Asthma    Colon polyps    adenomatous   Diabetes mellitus without complication ([place])    Diabetic infection of left foot ([place]) 12/2018   Diverticulosis of colon    Esophagitis    GERD (gastroesophageal reflux disease)    Hemorrhoid    internal   Hyperlipemia    Hypertension    IBS (irritable bowel syndrome)    no current prob - diet controlled   Myalgia due to statin 11/19/2017   Neuropathy    Neuropathy of both feet    Neuropathy of hand    Pneumonia    x 4   PONV (postoperative nausea and vomiting)    on some surgeries but not all procedures   Skin ulcer of right ankle,  limited to breakdown of skin ([place]) 12/31/2016   resolved per patient 05/14/19   Sleep apnea    has had in the past lost 50 pounds and do longer uses cpap   Statin intolerance 04/23/2015   Stroke ([place]) 2009   mini stroke per patient   Stroke-like episode 2009   TIA - mini stroke per patient   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 07/13/2015   Uncontrolled type 2 diabetes mellitus with hyperglycemia ([place]) 11/01/2017    Family History  Problem Relation Age of Onset   Lung cancer Father    Hypertension Father    Arthritis Father    Other Mother        hardening of the arteries/renal cell carcenoma   Hypertension Mother    Stroke Mother    Kidney cancer Mother    Arthritis Brother    Rheum arthritis Maternal Uncle    Bladder Cancer Neg Hx     Past Surgical History:  Procedure Laterality Date   ABDOMINAL HYSTERECTOMY     AMPUTATION Left 12/06/2018   Procedure: PARTIAL AMPUTATION LEFT FOOT;  Surgeon: Edrick Kins, DPM;  Location: [place];  Service: Podiatry;  Laterality: Left;   AMPUTATION Left 05/16/2019   Procedure: LEFT BELOW KNEE AMPUTATION;  Surgeon: Newt Minion, MD;  Location: [place];  Service: Orthopedics;  Laterality: Left;   BACK SURGERY  09/02/2015   BONE BIOPSY Left 12/06/2018   Procedure: Bone Biopsy;  Surgeon: Edrick Kins, DPM;  Location: [place];  Service: Podiatry;  Laterality: Left;   [place]   COLONOSCOPY N/A 11/30/2012   Procedure: COLONOSCOPY;  Surgeon: Irene Shipper, MD;  Location: [facility]OSCOPY;  Service: Endoscopy;  Laterality: N/A;   COLONOSCOPY N/A 09/05/2021   Procedure: COLONOSCOPY;  Surgeon: Lesly Rubenstein, MD;  Location[facility] END[facility]COPY;  Service: Endoscopy;  Laterality: N/A;   COLONOSCOPY WITH PROPOFOL N/A 01/03/2016   Procedure: COLONOSCOPY WITH PROPOFOL;  Surgeon: Manya Silvas, MD;  Location: [facility] ENDOSCOPY;  Service: Endoscopy;  Laterality: N/A;   ESOPHAGOGASTRODUODENOSCOPY N/A 09/05/2021    Procedure: ESOPHAGOGASTRODUODENOSCOPY (EGD);  Surgeon: Lesly Rubenstein, MD;  Location: [facility] ENDOSCOPY;  Service: Endoscopy;  Laterality: N/A;   ESOPHAGOGASTRODUODENOSCOPY (EGD) WITH PROPOFOL N/A 01/14/2015   Procedure: ESOPHAGOGASTRODUODENOSCOPY (EGD) WITH PROPOFOL;  Surgeon: Manya Silvas, MD;  Location: [facility] ENDOSCOPY;  Service: Endoscopy;  Laterality: N/A;   IRRIGATION  AND DEBRIDEMENT FOOT Left 03/13/2018   Procedure: IRRIGATION AND DEBRIDEMENT FOOT WITH BONE BIOPSY WITH MISONIX DEBRIDER;  Surgeon: Evelina [place], DPM;  Location: [place];  Service: Podiatry;  Laterality: Left;   IRRIGATION AND DEBRIDEMENT FOOT Left 12/06/2018   Procedure: Irrigation And Debridement Foot;  Surgeon: Edrick Kins, DPM;  Location: [place];  Service: Podiatry;  Laterality: Left;   LEG AMPUTATION BELOW KNEE Left    LUMBAR WOUND DEBRIDEMENT N/A 10/01/2015   Procedure: LUMBAR WOUND DEBRIDEMENT;  Surgeon: Ashok Pall, MD;  Location: [place] [facility]URO [facility]S;  Service: Neurosurgery;  Laterality: N/A;  LUMBAR WOUND DEBRIDEMENT   NASAL SINUS SURGERY     SAVORY DILATION N/A 01/14/2015   Procedure: SAVORY DILATION;  Surgeon: Manya Silvas, MD;  Location: [facility] ENDOSCOPY;  Service: Endoscopy;  Laterality: N/A;   TONSILLECTOMY     WISDOM TOOTH EXTRACTION     Social History   Occupational History   Occupation: disabled  Tobacco Use   Smoking status: Never   Smokeless tobacco: Never  Vaping Use   Vaping Use: Never used  Substance and Sexual Activity   Alcohol use: Never   Drug use: No   Sexual activity: Not Currently    Comment: Hysterectomy

## 2022-01-24 ENCOUNTER — Encounter: Payer: Self-pay | Admitting: Internal Medicine

## 2022-01-24 ENCOUNTER — Telehealth (INDEPENDENT_AMBULATORY_CARE_PROVIDER_SITE_OTHER): Payer: Medicare Other | Admitting: Internal Medicine

## 2022-01-24 ENCOUNTER — Ambulatory Visit: Payer: Self-pay | Admitting: *Deleted

## 2022-01-24 VITALS — BP 153/82 | HR 86

## 2022-01-24 DIAGNOSIS — K2100 Gastro-esophageal reflux disease with esophagitis, without bleeding: Secondary | ICD-10-CM

## 2022-01-24 DIAGNOSIS — E785 Hyperlipidemia, unspecified: Secondary | ICD-10-CM | POA: Diagnosis not present

## 2022-01-24 DIAGNOSIS — E781 Pure hyperglyceridemia: Secondary | ICD-10-CM

## 2022-01-24 DIAGNOSIS — I10 Essential (primary) hypertension: Secondary | ICD-10-CM | POA: Diagnosis not present

## 2022-01-24 MED ORDER — PANTOPRAZOLE SODIUM 40 MG PO TBEC: 40.0000 mg | DELAYED_RELEASE_TABLET | Freq: Every day | ORAL | 2 refills | Status: DC | PRN

## 2022-01-24 MED ORDER — EZETIMIBE 10 MG PO TABS: 10.0000 mg | ORAL_TABLET | Freq: Every day | ORAL | 1 refills | Status: DC

## 2022-01-24 MED ORDER — FENOFIBRATE 145 MG PO TABS: 145.0000 mg | ORAL_TABLET | Freq: Every day | ORAL | 1 refills | Status: DC

## 2022-01-24 MED ORDER — HYDROCHLOROTHIAZIDE 25 MG PO TABS: 25.0000 mg | ORAL_TABLET | Freq: Every day | ORAL | 0 refills | Status: DC

## 2022-01-24 NOTE — Patient Instructions (Addendum)
It was great seeing you today!  Plan discussed at today's visit: -Blood pressure medication increased to 25 mg daily -Continue to monitor blood pressure at home -Medications refilled   Follow up in: 1 month   Take care and let us know if you have any questions or concerns prior to your next visit.  Dr. Rosana Berger

## 2022-01-24 NOTE — Telephone Encounter (Addendum)
Patient called in checking status of if she will get transportation today to her appt at 9:20 today. I tried to call the [phone], but doesn't work.  Please call back

## 2022-01-24 NOTE — Progress Notes (Signed)
Established Patient Office Visit  Subjective   Patient ID: CHRISHAWN KRING, female    DOB: 1955/09/19  Age: 66 y.o. MRN: [id number]  No chief complaint on file.   HPI Patient is here for follow up on chronic medical conditions.   Soha Thorup is a 66 year old female presenting over the phone for follow up on chronic medical conditions.    Hypertension: -Medications: HCTZ 12.5 mg -Patient is compliant with above medications and reports no side effects. -Checking BP at home (average): not regularly, 128/65-70 -Denies any SOB, CP, vision changes, LE edema or symptoms of hypotension   HLD: -Medications: Zetia 10, Fenofibrate 145, and fish oil BID, statin intolerance  -Patient is compliant with above medications and reports no side effects.  -Last lipid panel: Lipid Panel     Component Value Date/Time   CHOL 194 01/21/2021 1042   CHOL 292 (H) 02/22/2015 1016   TRIG 255 (H) 01/21/2021 1042   HDL 44 (L) 01/21/2021 1042   HDL 24 (L) 02/22/2015 1016   CHOLHDL 4.4 01/21/2021 1042   VLDL NOT CALC 09/20/2016 1141   LDLCALC 114 (H) 01/21/2021 1042   LABVLDL Comment 02/22/2015 1016     The 10-year ASCVD risk score (Arnett DK, et al., 2019) is: 17.5%   Values used to calculate the score:     Age: 52 years     Sex: Female     Is Non-Hispanic African American: No     Diabetic: Yes     Tobacco smoker: No     Systolic Blood Pressure: 409 mmHg     Is BP treated: Yes     HDL Cholesterol: 44 mg/dL     Total Cholesterol: 194 mg/dL    GERD: -Currently on Protonix 40 taking PRN, symptoms well controlled.   Diabetes, Type 2: -Follows with Endocrinology, last seen 12/07/21. Has been a diabetic since 2004 -Last A1c 9.6% 6/23 -Medications: Restarted on Tresiba 20 units, Metformin 500 TID, Glimepiride 2 mg BID -Patient is compliant with the above medications and reports no side effects.  -Eye exam: 6 months ago, next appointment in March -Foot exam: Following with Podiatrist, will  see them again in March. Had one bone removed in right foot after multiple issues with pressure ulcers. Also has history of Charcot's bilaterally. Surgery was in 2019. Since surgery needs to wear foot brace, which does not fit into normal tennis shoes.  -Microalbumin: 8/22 -Statin: No, history of intolerance due to liver toxicity.  -PNA vaccine: up to date in 8/11 -Complications: L BKA 9147, poor wound healing, neuropathy in feet and hands. History of osteomyelitis in left lower extremity.  -Denies symptoms of hypoglycemia, polyuria, polydipsia, numbness extremities, foot ulcers/trauma.  -Does have diabetic neuropathy, on Gabapentin and Cymbalta.    Asthma:  -Asthma status: stable -Current Treatments: Albuterol PRN - using rarely, last time 3 months ago -Satisfied with current treatment?: yes -Dyspnea frequency: no -Wheezing frequency: no -Cough frequency: no -Nocturnal symptom frequency: no -Current upper respiratory symptoms: no -Pneumovax: Up to Date -Influenza: Not up to Date   Heath Maintenance: -Blood work UTD -Mammogram 3/22: Birads 1, calling to schedule  -Colonoscopy: 12/2015, repeat in 4 years, will call and schedule    ROS    Objective:     LMP  (LMP Unknown)    Physical Exam   No results found for any visits on 01/24/22.    The 10-year ASCVD risk score (Arnett DK, et al., 2019) is: 17.5%  Assessment & Plan:   Problem List Items Addressed This Visit   None   No follow-ups on file.    Teodora Medici, DO

## 2022-01-24 NOTE — Patient Outreach (Signed)
  Care Coordination   Initial Visit Note   01/24/2022 Name: LAKEYN DOKKEN MRN: [id number] DOB: [birth year]  KIRSI HUGH is a 66 y.o. year old female who sees Delsa Grana, [place] for primary care. I spoke with  [name] by phone today  What matters to the patients health and wellness today?  Transportation    Goals Addressed             This Visit's Progress    transportation needs       Care Coordination Interventions: Patient requesting assistance with transportation to her medical appointments Patient contacted the providers office on 01/20/22 requesting assistance and was referred to this social worker This social contacted patient on 01/20/22 but received no return call Patient called the morning of her appointment on 01/24/22 wondering the status of her ride Patient reminded that this social worker does not arrange transportation and that the [employer] department has been dismantled Discuss need to utilize her [facility] transportation benefit as well as family and friends for additional assistance Patient willing to apply for Database administrator for additional assistance Collaboration phone call to Glass blower/designer, discussed options for transportation including the use of taxi vouchers if deemed appropriate Solution-Focused Strategies employed:  Active listening / Reflection utilized  Emotional Support Provided         SDOH assessments and interventions completed:  Yes  SDOH Interventions Today    Flowsheet Row Most Recent Value  SDOH Interventions   Food Insecurity Interventions Intervention Not Indicated  Stress Interventions Intervention Not Indicated  Transportation Interventions Other (Comment)  [referred to transporation and mobility services]        Care Coordination Interventions Activated:  Yes  Care Coordination Interventions:  Yes, provided   Follow up plan: Follow up call scheduled for 01/31/22    Encounter  Outcome:  Pt. Visit Completed

## 2022-01-24 NOTE — Progress Notes (Signed)
Virtual Visit via Video Note  I connected with [name] on 01/24/22 at  9:20 AM EDT by a video enabled telemedicine application and verified that I am speaking with the correct person using two identifiers.  Location: Patient: [name] Provider: Home    I discussed the limitations of evaluation and management by telemedicine and the availability of in person appointments. The patient expressed understanding and agreed to proceed.  History of Present Illness:  Olivia Romero is a 66 year old female presenting over the phone for follow up on chronic medical conditions. Planning on a repeated back surgical procedure in the next few months. Also has been following with Orthopedics because she had an infection under her left leg prosthetic. Synovial samples were taken, no results yet. She has been treated with Doxycycline 100 mg BID x 1 week. Denies fevers, has mild pain but skin better after starting antibiotics.   Cyst on Left Kidney: -Per renal US on 01/10/22 - showing benign cyst in the mid to lower pole region of the left kidney - planning on repeating US in 6 months   Hypertension: -Medications: HCTZ 12.5 mg -Patient is compliant with above medications and reports no side effects. -Checking BP at home (average): not regularly, 140-150 over last several months  -Denies any SOB, CP, vision changes, LE edema or symptoms of hypotension   HLD: -Medications: Zetia 10, Fenofibrate 145, and fish oil BID, statin intolerance  -Patient is compliant with above medications and reports no side effects.  -Last lipid panel: Lipid Panel     Component Value Date/Time   CHOL 194 01/21/2021 1042   CHOL 292 (H) 02/22/2015 1016   TRIG 255 (H) 01/21/2021 1042   HDL 44 (L) 01/21/2021 1042   HDL 24 (L) 02/22/2015 1016   CHOLHDL 4.4 01/21/2021 1042   VLDL NOT CALC 09/20/2016 1141   LDLCALC 114 (H) 01/21/2021 1042   LABVLDL Comment 02/22/2015 1016   GERD: -Currently on Protonix 40 taking PRN,  symptoms well controlled.   Diabetes, Type 2: -Follows with Endocrinology, last seen 12/07/21. Has been a diabetic since 2004 -Last A1c 9.6% 6/23 -Medications: Restarted on Tresiba 20 units, Metformin 500 TID, Glimepiride 2 mg BID -Patient is compliant with the above medications and reports no side effects.  -Foot exam: Following with Podiatrist. Had one bone removed in right foot after multiple issues with pressure ulcers. Also has history of Charcot's bilaterally. Surgery was in 2019. Since surgery needs to wear foot brace, which does not fit into normal tennis shoes. Now with current infection. -Complications: L BKA 4098, poor wound healing, neuropathy in feet and hands. History of osteomyelitis in left lower extremity.  -Denies symptoms of hypoglycemia, polyuria, polydipsia, numbness extremities, foot ulcers/trauma.  -Does have diabetic neuropathy, on Gabapentin and Cymbalta.    Asthma:  -Asthma status: stable -Current Treatments: Albuterol PRN - using rarely, hasn't had to use in several months.  -Satisfied with current treatment?: yes -Dyspnea frequency: no -Wheezing frequency: no -Cough frequency: no -Nocturnal symptom frequency: no -Current upper respiratory symptoms: no -Pneumovax: Up to Date -Influenza: Not up to Date   Heath Maintenance: -Blood work UTD - plan to repeat kidney function  -Mammogram 6/23 Birads-1  -Colonoscopy: 4/23, repeat in 5 years   Observations/Objective:  General: well appearing, no acute distress ENT: conjunctiva normal appearing bilaterally  Neuro: answers all questions appropriately   Assessment and Plan:  1. Essential hypertension, benign: Blood pressure per patient higher at home over last few months. Increase HCTZ  to 25 mg daily, continue to monitor at home. Follow up here in 1 month to recheck, plan to recheck BMP.  - hydrochlorothiazide (HYDRODIURIL) 25 MG tablet; Take 1 tablet (25 mg total) by mouth daily.  Dispense: 90 tablet; Refill:  0  2. Hyperlipidemia, unspecified hyperlipidemia type/Hypertriglyceridemia: Continue Zetia 10 mg, Tricor 145 mg, refilled today.   - ezetimibe (ZETIA) 10 MG tablet; Take 1 tablet (10 mg total) by mouth daily.  Dispense: 90 tablet; Refill: 1 - fenofibrate (TRICOR) 145 MG tablet; Take 1 tablet (145 mg total) by mouth daily.  Dispense: 90 tablet; Refill: 1  3. Gastroesophageal reflux disease with esophagitis, unspecified whether hemorrhage: Protonix 40 mg refilled today, symptoms stable.  - pantoprazole (PROTONIX) 40 MG tablet; Take 1 tablet (40 mg total) by mouth daily as needed (Heartburn).  Dispense: 90 tablet; Refill: 2   Follow Up Instructions: 1 month in person     I discussed the assessment and treatment plan with the patient. The patient was provided an opportunity to ask questions and all were answered. The patient agreed with the plan and demonstrated an understanding of the instructions.   The patient was advised to call back or seek an in-person evaluation if the symptoms worsen or if the condition fails to improve as anticipated.  I provided 15 minutes of non-face-to-face time during this encounter.   Teodora Medici, DO

## 2022-01-24 NOTE — Patient Instructions (Signed)
Visit Information  Thank you for taking time to visit with me today. Please don't hesitate to contact me if I can be of assistance to you.   Following are the goals we discussed today:   Goals Addressed             This Visit's Progress    transportation needs       Care Coordination Interventions: Patient requesting assistance with transportation to her medical appointments Patient contacted the providers office on 01/20/22 requesting assistance and was referred to this social worker This social contacted patient on 01/20/22 but received no return call Patient called the morning of her appointment on 01/24/22 wondering the status of her ride Patient reminded that this social worker does not arrange transportation and that the [employer] department has been dismantled Discuss need to utilize her [facility] transportation benefit as well as family and friends for additional assistance Patient willing to apply for Database administrator for additional assistance Collaboration phone call to Glass blower/designer, discussed options for transportation including the use of taxi vouchers if deemed appropriate Solution-Focused Strategies employed:  Active listening / Reflection utilized  Emotional Support Provided         Our next appointment is by telephone on 01/31/22 at 10am  Please call the care guide team at [phone] if you need to cancel or reschedule your appointment.   If you are experiencing a Mental Health or [place] or need someone to talk to, please call the Suicide and Crisis Lifeline: 988   Patient verbalizes understanding of instructions and care plan provided today and agrees to view in [place]. Active MyChart status and patient understanding of how to access instructions and care plan via MyChart confirmed with patient.     Telephone follow up appointment with care management team member scheduled for: 01/31/22 10am  [place],  [place] Worker  [facility] Care Management [phone]

## 2022-01-26 LAB — ANAEROBIC AND AEROBIC CULTURE
AER RESULT:: NO GROWTH
MICRO NUMBER:: 13799977
MICRO NUMBER:: 13799978
SPECIMEN QUALITY:: ADEQUATE
SPECIMEN QUALITY:: ADEQUATE

## 2022-01-26 LAB — SYNOVIAL FLUID ANALYSIS, COMPLETE
Basophils, %: 0 %
Eosinophils-Synovial: 0 % (ref 0–2)
Lymphocytes-Synovial Fld: 2 % (ref 0–74)
Monocyte/Macrophage: 7 % (ref 0–69)
Neutrophil, Synovial: 91 % — ABNORMAL HIGH (ref 0–24)
Synoviocytes, %: 0 % (ref 0–15)
WBC, Synovial: 6007 cells/uL — ABNORMAL HIGH (ref <150)

## 2022-01-30 ENCOUNTER — Encounter: Payer: Self-pay | Admitting: Orthopedic Surgery

## 2022-01-30 ENCOUNTER — Ambulatory Visit: Payer: Medicare Other | Admitting: Orthopedic Surgery

## 2022-01-30 DIAGNOSIS — Z89512 Acquired absence of left leg below knee: Secondary | ICD-10-CM | POA: Diagnosis not present

## 2022-01-30 DIAGNOSIS — T888XXA Other specified complications of surgical and medical care, not elsewhere classified, initial encounter: Secondary | ICD-10-CM

## 2022-01-30 NOTE — Progress Notes (Signed)
Office Visit Note   Patient: Olivia Romero           Date of Birth: Jul 06, 1955           MRN: [id number] Visit Date: 01/30/2022              Requested by: Delsa Grana, PA-C [address] [place],  [place] [address] PCP: Delsa Grana, PA-C  Chief Complaint  Patient presents with   Left Leg - Pain      HPI:  Patient is a 66 year old woman who is status post aspiration of fluid collection residual limb left transtibial amputation by Lurena Joiner.  Cultures were negative for bacteria showed calcium pyrophosphate crystals.  Patient states she has a history of gout but no history of pseudogout. Assessment & Plan: Visit Diagnoses:  1. Hx of BKA, left ([place])   2. Fluid collection at surgical site, initial encounter     Plan: Patient will hydrate.  She is given a prescription for [employer] to modify her socket.  Follow-Up Instructions: Return in about 4 weeks (around 02/27/2022).   Ortho Exam  Patient is alert, oriented, no adenopathy, well-dressed, normal affect, normal respiratory effort. Examination patient has no redness or effusion of any joints.  Left knee has no swelling the left transtibial amputation has no swelling no redness no fluctuance.  No signs of recurrent fluid.  There is no warmth.  Patient has significant decreased volume in the residual limb and feels like her prosthesis is loose.  No gout symptoms at this time.  Imaging: No results found. No images are attached to the encounter.  Labs: Lab Results  Component Value Date   HGBA1C 9.6 11/30/2021   HGBA1C 8.7 06/21/2021   HGBA1C 7.2 (H) 01/21/2021   ESRSEDRATE 60 (H) 12/05/2018   ESRSEDRATE 41 (H) 12/04/2018   ESRSEDRATE 30 07/12/2018   CRP 22.0 (H) 12/05/2018   CRP 20 (H) 12/04/2018   CRP 6 07/12/2018   LABURIC 5.4 04/22/2020   LABURIC 8.7 (H) 03/25/2020   LABURIC 8.2 (H) 11/30/2015   REPTSTATUS 12/12/2018 FINAL 12/06/2018   GRAMSTAIN  12/06/2018    RARE WBC PRESENT,  PREDOMINANTLY PMN NO ORGANISMS SEEN    CULT  12/06/2018    RARE METHICILLIN RESISTANT STAPHYLOCOCCUS AUREUS WITH NORMAL SKIN FLORA NO ANAEROBES ISOLATED Performed at [facility], [place] [address] [place], [place] [address]    LABORGA METHICILLIN RESISTANT STAPHYLOCOCCUS AUREUS 12/06/2018     Lab Results  Component Value Date   ALBUMIN 3.7 12/04/2018   ALBUMIN 4.0 11/27/2018   ALBUMIN 3.8 03/11/2018   PREALBUMIN 21.5 03/12/2018    No results found for: "MG" Lab Results  Component Value Date   VD25OH 11 (L) 09/20/2016    Lab Results  Component Value Date   PREALBUMIN 21.5 03/12/2018      Latest Ref Rng & Units 12/23/2021    3:58 PM 01/21/2021   10:42 AM 07/12/2020   11:03 AM  CBC EXTENDED  WBC 3.8 - 10.8 Thousand/uL 9.3  9.1  7.6   RBC 3.80 - 5.10 Million/uL 4.89  4.68  4.40   Hemoglobin 11.7 - 15.5 g/dL 13.3  11.6  11.3   HCT 35.0 - 45.0 % 41.1  37.7  35.6   Platelets 140 - 400 Thousand/uL 336  330  344   NEUT# 1,500 - 7,800 cells/uL 5,357  5,296  4,218   Lymph# 850 - 3,900 cells/uL 2,920  2,803  2,622  There is no height or weight on file to calculate BMI.  Orders:  No orders of the defined types were placed in this encounter.  No orders of the defined types were placed in this encounter.    Procedures: No procedures performed  Clinical Data: No additional findings.  ROS:  All other systems negative, except as noted in the HPI. Review of Systems  Objective: Vital Signs: LMP  (LMP Unknown)   Specialty Comments:  No specialty comments available.  PMFS History: Patient Active Problem List   Diagnosis Date Noted   Statin myopathy 11/23/2021   Left lower quadrant abdominal tenderness without rebound tenderness 08/03/2021   History of colitis 08/03/2021   Change in bowel habits 08/03/2021   Generalized abdominal pain 08/03/2021   Anemia 01/21/2021   Urticaria 10/21/2020   Rash and nonspecific skin eruption 07/27/2020   Hepatotoxicity  due to statin drug 07/15/2020   Vitamin D deficiency 07/12/2020   Chronic low back pain 07/12/2020   BMI 36.0-36.9,adult 12/29/2019   Sepsis without acute organ dysfunction ([place]) 12/29/2019   Post-operative state 12/29/2019   Asthma 12/29/2019   Cervical disc disorder 12/29/2019   Displacement of cervical intervertebral disc 12/29/2019   Abnormal posture 12/29/2019   Brachial radiculitis 12/29/2019   Neck pain 12/29/2019   Pain in upper limb 12/29/2019   Whiplash injury to neck 12/29/2019   Allergy to statin medication 12/01/2019   History of below knee amputation, left ([place]) 07/30/2019   Gangrene of left foot ([place]) 05/16/2019   Charcot's joint of foot, left    PAD (peripheral artery disease) ([place]) 12/05/2018   MRSA bacteremia 12/05/2018   Charcot's joint of foot due to diabetes ([place]) 09/18/2018   Class 2 severe obesity with serious comorbidity and body mass index (BMI) of 37.0 to 37.9 in adult ([place]) 11/19/2017   B12 deficiency 11/01/2017   Diabetic polyneuropathy associated with type 2 diabetes mellitus ([place]) 12/13/2015   Lumbar stenosis with neurogenic claudication 09/02/2015   Chronic constipation 07/13/2015   OSA (obstructive sleep apnea) 06/25/2015   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/23/2015   Hyperlipidemia 04/23/2015   Statin intolerance 04/23/2015   Asthma, mild intermittent 02/01/2015   Type 2 diabetes mellitus with diabetic neuropathy, with long-term current use of insulin ([place]) 12/31/2014   Gastroesophageal reflux disease with esophagitis 12/31/2014   Dysphagia 12/31/2014   Bilateral carpal tunnel syndrome 12/01/2014   Diverticulosis of colon 11/30/2012   Benign essential hypertension 11/28/2012   Past Medical History:  Diagnosis Date   Anemia    Arthritis    Asthma    Colon polyps    adenomatous   Diabetes mellitus without complication ([place])    Diabetic infection of left foot ([place]) 12/2018   Diverticulosis of colon    Esophagitis    GERD  (gastroesophageal reflux disease)    Hemorrhoid    internal   Hyperlipemia    Hypertension    IBS (irritable bowel syndrome)    no current prob - diet controlled   Myalgia due to statin 11/19/2017   Neuropathy    Neuropathy of both feet    Neuropathy of hand    Pneumonia    x 4   PONV (postoperative nausea and vomiting)    on some surgeries but not all procedures   Skin ulcer of right ankle, limited to breakdown of skin ([place]) 12/31/2016   resolved per patient 05/14/19   Sleep apnea    has had in the past lost 50 pounds and do  longer uses cpap   Statin intolerance 04/23/2015   Stroke [facility]) 2009   mini stroke per patient   Stroke-like episode 2009   TIA - mini stroke per patient   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 07/13/2015   Uncontrolled type 2 diabetes mellitus with hyperglycemia ([place]) 11/01/2017    Family History  Problem Relation Age of Onset   Lung cancer Father    Hypertension Father    Arthritis Father    Other Mother        hardening of the arteries/renal cell carcenoma   Hypertension Mother    Stroke Mother    Kidney cancer Mother    Arthritis Brother    Rheum arthritis Maternal Uncle    Bladder Cancer Neg Hx     Past Surgical History:  Procedure Laterality Date   ABDOMINAL HYSTERECTOMY     AMPUTATION Left 12/06/2018   Procedure: PARTIAL AMPUTATION LEFT FOOT;  Surgeon: Edrick Kins, DPM;  Location: [place];  Service: Podiatry;  Laterality: Left;   AMPUTATION Left 05/16/2019   Procedure: LEFT BELOW KNEE AMPUTATION;  Surgeon: Newt Minion, MD;  Location: [place];  Service: Orthopedics;  Laterality: Left;   BACK SURGERY  09/02/2015   BONE BIOPSY Left 12/06/2018   Procedure: Bone Biopsy;  Surgeon: Edrick Kins, DPM;  Location: [place];  Service: Podiatry;  Laterality: Left;   [place]   COLONOSCOPY N/A 11/30/2012   Procedure: COLONOSCOPY;  Surgeon: Irene Shipper, MD;  Location: [facility] ENDOSCOPY;  Service: Endoscopy;   Laterality: N/A;   COLONOSCOPY N/A 09/05/2021   Procedure: COLONOSCOPY;  Surgeon: Lesly Rubenstein, MD;  Location[facility] ENDOSCOPY;  Service: Endoscopy;  Laterality: N/A;   COLONOSCOPY WITH PROPOFOL N/A 01/03/2016   Procedure: COLONOSCOPY WITH PROPOFOL;  Surgeon: Manya Silvas, MD;  Location: [facility]OSCOPY;  Service: Endoscopy;  Laterality: N/A;   ESOPHAGOGASTRODUODENOSCOPY N/A 09/05/2021   Procedure: ESOPHAGOGASTRODUODENOSCOPY (EGD);  Surgeon: Lesly Rubenstein, MD;  Location: [facility] ENDOSCOPY;  Service: Endoscopy;  Laterality: N/A;   ESOPHAGOGASTRODUODENOSCOPY (EGD) WITH PROPOFOL N/A 01/14/2015   Procedure: ESOPHAGOGASTRODUODENOSCOPY (EGD) WITH PROPOFOL;  Surgeon: Manya Silvas, MD;  Location[facility] ENDOSCOPY;  Service: Endoscopy;  Laterality: N/A;   IRRIGATION AND DEBRIDEMENT FOOT Left 03/13/2018   Procedure: IRRIGATION AND DEBRIDEMENT FOOT WITH BONE BIOPSY WITH MISONIX DEBRIDER;  Surgeon: Evelina Bucy, DPM;  Location: [place];  Service: Podiatry;  Laterality: Left;   IRRIGATION AND DEBRIDEMENT FOOT Left 12/06/2018   Procedure: Irrigation And Debridement Foot;  Surgeon: Edrick Kins, DPM;  Location: [place];  Service: Podiatry;  Laterality: Left;   LEG AMPUTATION BELOW KNEE Left    LUMBAR WOUND DEBRIDEMENT N/A 10/01/2015   Procedure: LUMBAR WOUND DEBRIDEMENT;  Surgeon: Ashok Pall, MD;  Location: [place] NEURO ORS;  Service: Neurosurgery;  Laterality: N/A;  LUMBAR WOUND DEBRIDEMENT   NASAL SINUS SURGERY     SAVORY DILATION N/A 01/14/2015   Procedure: SAVORY DILATION;  Surgeon: Manya Silvas, MD;  Location: [facility] ENDOSCOPY;  Service: Endoscopy;  Laterality: N/A;   TONSILLECTOMY     WISDOM TOOTH EXTRACTION     Social History   Occupational History   Occupation: disabled  Tobacco Use   Smoking status: Never   Smokeless tobacco: Never  Vaping Use   Vaping Use: Never used  Substance and Sexual Activity   Alcohol use: Never   Drug use: No   Sexual activity: Not Currently    Comment:  Hysterectomy

## 2022-01-31 ENCOUNTER — Ambulatory Visit: Payer: Self-pay | Admitting: *Deleted

## 2022-01-31 NOTE — Patient Instructions (Signed)
Visit Information  Thank you for taking time to visit with me today. Please don't hesitate to contact me if I can be of assistance to you.   Following are the goals we discussed today:   Goals Addressed             This Visit's Progress    transportation needs       Care Coordination Interventions: Patient requesting assistance with transportation to her medical appointments Patient states that she is using her [employer] transportation benefit but only has 5 rides left Patient [place] outside of city limits, patient agreeable to referral to the Transportation and [employer], application completed and faxed in Patient informed that she will receive a call once the application is approved Solution-Focused Strategies employed:  Active listening / Reflection utilized  Emotional Support continues to be provided         Our next appointment is by telephone on 02/14/22 at 10am  Please call the care guide team at [phone] if you need to cancel or reschedule your appointment.   If you are experiencing a Mental Health or [place] or need someone to talk to, please call the Suicide and Crisis Lifeline: 988   Patient verbalizes understanding of instructions and care plan provided today and agrees to view in [place]. Active MyChart status and patient understanding of how to access instructions and care plan via MyChart confirmed with patient.     Telephone follow up appointment with care management team member scheduled for:02/14/22  [name], [place] Worker  [facility] Care Management [phone]

## 2022-01-31 NOTE — Patient Outreach (Signed)
  Care Coordination   Follow Up Visit Note   01/31/2022 Name: WILLODEAN LEVEN MRN: [id number] DOB: 04/27/1956  COURTNEY BELLIZZI is a 66 y.o. year old female who sees Delsa Grana, [place] for primary care. I spoke with  [name] by phone today.  What matters to the patients health and wellness today?  Transportation resources    Goals Addressed             This Visit's Progress    transportation needs       Care Coordination Interventions: Patient requesting assistance with transportation to her medical appointments Patient states that she is using her [employer] transportation benefit but only has 5 rides left Patient lives outside of city limits, patient agreeable to referral to the Transportation and [employer], application completed and faxed in Patient informed that she will receive a call once the application is approved Solution-Focused Strategies employed:  Active listening / Reflection utilized  Emotional Support continues to be provided         SDOH assessments and interventions completed:  No     Care Coordination Interventions Activated:  Yes  Care Coordination Interventions:  Yes, provided   Follow up plan: Follow up call scheduled for 02/14/22    Encounter Outcome:  Pt. Visit Completed

## 2022-02-02 ENCOUNTER — Ambulatory Visit: Payer: Medicare Other | Admitting: Orthopedic Surgery

## 2022-02-07 ENCOUNTER — Telehealth: Payer: Self-pay | Admitting: Family Medicine

## 2022-02-07 NOTE — Telephone Encounter (Signed)
Patient returned call and was provided information pr previous note on 02/07/22. Patient will contact her endocrinologist for refills. Advised patient to return call to office if endocrinologist will no longer provide the refill on the requested medications. Understanding verbalized.

## 2022-02-07 NOTE — Telephone Encounter (Signed)
Medication Refill: Glucose test strips and needles.   Pharmacy:  [facility], [place] Phone:  [phone]  Fax:  [phone]       Last visit: 12/26/21

## 2022-02-07 NOTE — Telephone Encounter (Signed)
Called pt no answer left vm to return my call.   On her current medication list I see a different provider has done those refills for her, I wanted to make sure if it has been done by her endocrinologist. If yes she should continue to request them through there office, since I do not see anything recent where PCP has prescribed them to her.

## 2022-02-14 ENCOUNTER — Ambulatory Visit: Payer: Self-pay | Admitting: *Deleted

## 2022-02-14 NOTE — Patient Instructions (Signed)
Visit Information  Thank you for taking time to visit with me today. Please don't hesitate to contact me if I can be of assistance to you.   Following are the goals we discussed today:   Goals Addressed             This Visit's Progress    COMPLETED: Manage My Emotions       Timeframe:  Long-Range Goal Priority:  Medium Start Date:         02/04/21                    Expected End Date:  03/21/21                  Follow Up Date 03/21/21    - start or continue a personal journal - talk about feelings with a friend, family or spiritual advisor - practice positive thinking and self-talk    Why is this important?   When you are stressed, down or upset, your body reacts too.  For example, your blood pressure may get higher; you may have a headache or stomachache.  When your emotions get the best of you, your body's ability to fight off cold and flu gets weak.  These steps will help you manage your emotions.     Notes:      transportation needs       Care Coordination Interventions: Patient requesting assistance with transportation to her medical appointments Patient states that she is using her [facility] transportation benefit but now has 3 rides left Transportation and [employer] completed-patient now in their system per [employer] and can begin to call and schedule rides to medical appointments [phone] can call up to 2 weeks before her appointment and before 12pm the day before her appointment Patient informed of transportation approval and provided with contact information to call to schedule transport to medical appointments Patient verbalized having no additional community resource needs at this time, this social worker's contact information provided-patient encouraged to call with any additional needs          If you are experiencing a Mental Health or [place] or need someone to talk to, please call the Suicide and Crisis  Lifeline: 988 call 911   Patient verbalizes understanding of instructions and care plan provided today and agrees to view in [place]. Active MyChart status and patient understanding of how to access instructions and care plan via MyChart confirmed with patient.     No further follow up required: patient to call this social worker at [phone] with any additional community resource needs  [employer], [place] Worker  [facility] Care Management [phone]

## 2022-02-14 NOTE — Patient Outreach (Signed)
  Care Coordination   Follow Up Visit Note   02/14/2022 Name: JAMARIA AMBORN MRN: [id number] DOB: 1955/09/23  TOPEKA GIAMMONA is a 66 y.o. year old female who sees Delsa Grana, [place] for primary care. I spoke with  [name] by phone today.  What matters to the patients health and wellness today?  Transportation needs    transportation needs       Care Coordination Interventions: Patient requesting assistance with transportation to her medical appointments Patient states that she is using her [facility] transportation benefit but now has 3 rides left Transportation and [employer] completed-patient now in their system per [employer] and can begin to call and schedule rides to medical appointments [phone] can call up to 2 weeks before her appointment and before 12pm the day before her appointment Patient informed of transportation approval and provided with contact information to call to schedule transport to medical appointments Patient verbalized having no additional community resource needs at this time, this social worker's contact information provided-patient encouraged to call with any additional needs        SDOH assessments and interventions completed:  Yes  SDOH Interventions Today    Flowsheet Row Most Recent Value  SDOH Interventions   Transportation Interventions --  [patient referred to Transportation and Mobility services and has been approved to receive rides to medical appointments]        Care Coordination Interventions Activated:  Yes  Care Coordination Interventions:  Yes, provided   Follow up plan: No further intervention required.   Encounter Outcome:  Pt. Visit Completed

## 2022-02-24 LAB — HM DIABETES EYE EXAM

## 2022-03-02 ENCOUNTER — Ambulatory Visit: Payer: Medicare Other | Admitting: Orthopedic Surgery

## 2022-03-02 DIAGNOSIS — Z89512 Acquired absence of left leg below knee: Secondary | ICD-10-CM

## 2022-03-14 ENCOUNTER — Encounter: Payer: Self-pay | Admitting: Orthopedic Surgery

## 2022-03-14 ENCOUNTER — Other Ambulatory Visit: Payer: Self-pay | Admitting: Neurosurgery

## 2022-03-14 NOTE — Progress Notes (Signed)
Office Visit Note   Patient: Olivia Romero           Date of Birth: 02-05-1956           MRN: [id number] Visit Date: 03/02/2022              Requested by: Delsa Grana, PA-C 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  Patient has followed up with [facility] for socket modification.  Patient did have a effusion of the left residual limb that was aspirated by Lurena Joiner on August 18.  Cultures were negative.  Patient states she is feeling much better.  Assessment & Plan: Visit Diagnoses:  1. Hx of BKA, left ([place])     Plan: Patient's socket has been modified she has no ulcers or swelling.  Follow-up in 3 months.  Follow-Up Instructions: Return in about 3 months (around 06/01/2022).   Ortho Exam  Patient is alert, oriented, no adenopathy, well-dressed, normal affect, normal respiratory effort. Examination the residual limb there is no cellulitis no effusion.  There were no open ulcers no drainage.  Imaging: No results found. No images are attached to the encounter.  Labs: Lab Results  Component Value Date   HGBA1C 9.6 11/30/2021   HGBA1C 8.7 06/21/2021   HGBA1C 7.2 (H) 01/21/2021   ESRSEDRATE 60 (H) 12/05/2018   ESRSEDRATE 41 (H) 12/04/2018   ESRSEDRATE 30 07/12/2018   CRP 22.0 (H) 12/05/2018   CRP 20 (H) 12/04/2018   CRP 6 07/12/2018   LABURIC 5.4 04/22/2020   LABURIC 8.7 (H) 03/25/2020   LABURIC 8.2 (H) 11/30/2015   REPTSTATUS 12/12/2018 FINAL 12/06/2018   GRAMSTAIN  12/06/2018    RARE WBC PRESENT, PREDOMINANTLY PMN NO ORGANISMS SEEN    CULT  12/06/2018    RARE METHICILLIN RESISTANT STAPHYLOCOCCUS AUREUS WITH NORMAL SKIN FLORA NO ANAEROBES ISOLATED Performed at [facility], [place] [address], [place], [place] [address]    LABORGA METHICILLIN RESISTANT STAPHYLOCOCCUS  AUREUS 12/06/2018     Lab Results  Component Value Date   ALBUMIN 3.7 12/04/2018   ALBUMIN 4.0 11/27/2018   ALBUMIN 3.8 03/11/2018   PREALBUMIN 21.5 03/12/2018    No results found for: "MG" Lab Results  Component Value Date   VD25OH 11 (L) 09/20/2016    Lab Results  Component Value Date   PREALBUMIN 21.5 03/12/2018      Latest Ref Rng & Units 12/23/2021    3:58 PM 01/21/2021   10:42 AM 07/12/2020   11:03 AM  CBC EXTENDED  WBC 3.8 - 10.8 Thousand/uL 9.3  9.1  7.6   RBC 3.80 - 5.10 Million/uL 4.89  4.68  4.40   Hemoglobin 11.7 - 15.5 g/dL 13.3  11.6  11.3   HCT 35.0 - 45.0 % 41.1  37.7  35.6   Platelets 140 - 400 Thousand/uL 336  330  344   NEUT# 1,500 - 7,800 cells/uL 5,357  5,296  4,218   Lymph# 850 - 3,900 cells/uL 2,920  2,803  2,622      There is no height or weight on file to calculate BMI.  Orders:  No orders of the defined types were placed in this encounter.  No orders of the defined types were placed in this encounter.    Procedures: No procedures performed  Clinical Data: No additional findings.  ROS:  All other systems negative, except as noted in the HPI. Review of Systems  Objective: Vital Signs: LMP  (LMP Unknown)   Specialty Comments:  No specialty comments available.  PMFS History: Patient Active Problem List   Diagnosis Date Noted   Statin myopathy 11/23/2021   Left lower quadrant abdominal tenderness without rebound tenderness 08/03/2021   History of colitis 08/03/2021   Change in bowel habits 08/03/2021   Generalized abdominal pain 08/03/2021   Anemia 01/21/2021   Urticaria 10/21/2020   Rash and nonspecific skin eruption 07/27/2020   Hepatotoxicity due to statin drug 07/15/2020   Vitamin D deficiency 07/12/2020   Chronic low back pain 07/12/2020   BMI 36.0-36.9,adult 12/29/2019   Sepsis without acute organ dysfunction ([place]) 12/29/2019   Post-operative state 12/29/2019   Asthma 12/29/2019   Cervical disc disorder 12/29/2019    Displacement of cervical intervertebral disc 12/29/2019   Abnormal posture 12/29/2019   Brachial radiculitis 12/29/2019   Neck pain 12/29/2019   Pain in upper limb 12/29/2019   Whiplash injury to neck 12/29/2019   Allergy to statin medication 12/01/2019   History of below knee amputation, left ([place]) 07/30/2019   Gangrene of left foot ([place]) 05/16/2019   Charcot's joint of foot, left    PAD (peripheral artery disease) ([place]) 12/05/2018   MRSA bacteremia 12/05/2018   Charcot's joint of foot due to diabetes ([place]) 09/18/2018   Class 2 severe obesity with serious comorbidity and body mass index (BMI) of 37.0 to 37.9 in adult ([place]) 11/19/2017   B12 deficiency 11/01/2017   Diabetic polyneuropathy associated with type 2 diabetes mellitus ([place]) 12/13/2015   Lumbar stenosis with neurogenic claudication 09/02/2015   Chronic constipation 07/13/2015   OSA (obstructive sleep apnea) 06/25/2015   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/23/2015   Hyperlipidemia 04/23/2015   Statin intolerance 04/23/2015   Asthma, mild intermittent 02/01/2015   Type 2 diabetes mellitus with diabetic neuropathy, with long-term current use of insulin ([place]) 12/31/2014   Gastroesophageal reflux disease with esophagitis 12/31/2014   Dysphagia 12/31/2014   Bilateral carpal tunnel syndrome 12/01/2014   Diverticulosis of colon 11/30/2012   Benign essential hypertension 11/28/2012   Past Medical History:  Diagnosis Date   Anemia    Arthritis    Asthma    Colon polyps    adenomatous   Diabetes mellitus without complication ([place])    Diabetic infection of left foot ([place]) 12/2018   Diverticulosis of colon    Esophagitis    GERD (gastroesophageal reflux disease)    Hemorrhoid    internal   Hyperlipemia    Hypertension    IBS (irritable bowel syndrome)    no current prob - diet controlled   Myalgia due to statin 11/19/2017   Neuropathy    Neuropathy of both feet    Neuropathy of hand    Pneumonia    x  4   PONV (postoperative nausea and vomiting)    on some surgeries but not all procedures   Skin ulcer of right ankle, limited to breakdown of skin ([place]) 12/31/2016   resolved per patient 05/14/19   Sleep apnea    has had in the past lost 50 pounds and do longer uses cpap   Statin intolerance 04/23/2015   Stroke ([place]) 2009   mini stroke per patient   Stroke-like episode 2009   TIA - mini stroke per patient   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 07/13/2015   Uncontrolled  type 2 diabetes mellitus with hyperglycemia ([place]) 11/01/2017    Family History  Problem Relation Age of Onset   Lung cancer Father    Hypertension Father    Arthritis Father    Other Mother        hardening of the arteries/renal cell carcenoma   Hypertension Mother    Stroke Mother    Kidney cancer Mother    Arthritis Brother    Rheum arthritis Maternal Uncle    Bladder Cancer Neg Hx     Past Surgical History:  Procedure Laterality Date   ABDOMINAL HYSTERECTOMY     AMPUTATION Left 12/06/2018   Procedure: PARTIAL AMPUTATION LEFT FOOT;  Surgeon: Edrick Kins, DPM;  Location: [facility];  Service: Podiatry;  Laterality: Left;   AMPUTATION Left 05/16/2019   Procedure: LEFT BELOW KNEE AMPUTATION;  Surgeon: Newt Minion, MD;  Location: [facility];  Service: Orthopedics;  Laterality: Left;   BACK SURGERY  09/02/2015   BONE BIOPSY Left 12/06/2018   Procedure: Bone Biopsy;  Surgeon: Edrick Kins, DPM;  Location: [facility];  Service: Podiatry;  Laterality: Left;   [place]   COLONOSCOPY N/A 11/30/2012   Procedure: COLONOSCOPY;  Surgeon: Irene Shipper, MD;  Location: [facility] ENDOSCOPY;  Service: Endoscopy;  Laterality: N/A;   COLONOSCOPY N/A 09/05/2021   Procedure: COLONOSCOPY;  Surgeon: Lesly Rubenstein, MD;  Location: [facility] ENDOSCOPY;  Service: Endoscopy;  Laterality: N/A;   COLONOSCOPY WITH PROPOFOL N/A 01/03/2016   Procedure: COLONOSCOPY WITH PROPOFOL;  Surgeon: Manya Silvas, MD;   Location: [facility] ENDOSCOPY;  Service: Endoscopy;  Laterality: N/A;   ESOPHAGOGASTRODUODENOSCOPY N/A 09/05/2021   Procedure: ESOPHAGOGASTRODUODENOSCOPY (EGD);  Surgeon: Lesly Rubenstein, MD;  Location: [facility] ENDOSCOPY;  Service: Endoscopy;  Laterality: N/A;   ESOPHAGOGASTRODUODENOSCOPY (EGD) WITH PROPOFOL N/A 01/14/2015   Procedure: ESOPHAGOGASTRODUODENOSCOPY (EGD) WITH PROPOFOL;  Surgeon: Manya Silvas, MD;  Location: [facility] ENDOSCOPY;  Service: Endoscopy;  Laterality: N/A;   IRRIGATION AND DEBRIDEMENT FOOT Left 03/13/2018   Procedure: IRRIGATION AND DEBRIDEMENT FOOT WITH BONE BIOPSY WITH MISONIX DEBRIDER;  Surgeon: Evelina Bucy, DPM;  Location: [facility];  Service: Podiatry;  Laterality: Left;   IRRIGATION AND DEBRIDEMENT FOOT Left 12/06/2018   Procedure: Irrigation And Debridement Foot;  Surgeon: Edrick Kins, DPM;  Location: [facility];  Service: Podiatry;  Laterality: Left;   LEG AMPUTATION BELOW KNEE Left    LUMBAR WOUND DEBRIDEMENT N/A 10/01/2015   Procedure: LUMBAR WOUND DEBRIDEMENT;  Surgeon: Ashok Pall, MD;  Location: [facility] NEURO ORS;  Service: Neurosurgery;  Laterality: N/A;  LUMBAR WOUND DEBRIDEMENT   NASAL SINUS SURGERY     SAVORY DILATION N/A 01/14/2015   Procedure: SAVORY DILATION;  Surgeon: Manya Silvas, MD;  Location: [facility] ENDOSCOPY;  Service: Endoscopy;  Laterality: N/A;   TONSILLECTOMY     WISDOM TOOTH EXTRACTION     Social History   Occupational History   Occupation: disabled  Tobacco Use   Smoking status: Never   Smokeless tobacco: Never  Vaping Use   Vaping Use: Never used  Substance and Sexual Activity   Alcohol use: Never   Drug use: No   Sexual activity: Not Currently    Comment: Hysterectomy

## 2022-03-23 NOTE — Pre-Procedure Instructions (Signed)
Surgical Instructions    Your procedure is scheduled on Thursday, October 26.  Report to [facility] Main Entrance "A" at 10:30 A.M., then check in with the Admitting office.  Call this number if you have problems the morning of surgery:  [phone]   If you have any questions prior to your surgery date call [phone]: Open Monday-Friday 8am-4pm If you experience any cold or flu symptoms such as cough, fever, chills, shortness of breath, etc. between now and your scheduled surgery, please notify us at the above number     Remember:  Do not eat after midnight the night before your surgery  You may drink clear liquids until 9:30AM the morning of your surgery.   Clear liquids allowed are: Water, Non-Citrus Juices (without pulp), Carbonated Beverages, Clear Tea, Black Coffee ONLY (NO MILK, CREAM OR POWDERED CREAMER of any kind), and Gatorade    Take these medicines the morning of surgery with A SIP OF WATER:  ezetimibe (ZETIA) fenofibrate (TRICOR) gabapentin (NEURONTIN)  mesalamine (LIALDA)  pantoprazole (PROTONIX)  acetaminophen (TYLENOL) if needed albuterol (VENTOLIN HFA) 108 (90 Base) MCG/ACT inhaler if needed- Please bring all inhalers with you the day of surgery.   Follow your surgeon's instructions on when to stop Aspirin.  If no instructions were given by your surgeon then you will need to call the office to get those instructions.     As of today, STOP taking any (unless otherwise instructed by your surgeon) Aleve, Naproxen, Ibuprofen, Motrin, Advil, Goody's, BC's, all herbal medications, fish oil, and all vitamins.           WHAT DO I DO ABOUT MY DIABETES MEDICATION?   Do not take oral diabetes medicines (pills) the morning of surgery. DO NOT TAKE Metformin (Glucophage) or Glimepiride (Amaryl) the day of surgery.   THE NIGHT BEFORE SURGERY, take 10 units (Half dose) of insulin Degludec Tyler Aas).       The day of surgery, do not take other diabetes injectables,  including Byetta (exenatide), Bydureon (exenatide ER), Victoza (liraglutide), or Trulicity (dulaglutide).  If your CBG is greater than 220 mg/dL, you may take  of your sliding scale (correction) dose of insulin.   HOW TO MANAGE YOUR DIABETES BEFORE AND AFTER SURGERY  Why is it important to control my blood sugar before and after surgery? Improving blood sugar levels before and after surgery helps healing and can limit problems. A way of improving blood sugar control is eating a healthy diet by:  Eating less sugar and carbohydrates  Increasing activity/exercise  Talking with your doctor about reaching your blood sugar goals High blood sugars (greater than 180 mg/dL) can raise your risk of infections and slow your recovery, so you will need to focus on controlling your diabetes during the weeks before surgery. Make sure that the doctor who takes care of your diabetes knows about your planned surgery including the date and location.  How do I manage my blood sugar before surgery? Check your blood sugar at least 4 times a day, starting 2 days before surgery, to make sure that the level is not too high or low.  Check your blood sugar the morning of your surgery when you wake up and every 2 hours until you get to the Short Stay unit.  If your blood sugar is less than 70 mg/dL, you will need to treat for low blood sugar: Do not take insulin. Treat a low blood sugar (less than 70 mg/dL) with  cup of clear juice (cranberry  or apple), 4 glucose tablets, OR glucose gel. Recheck blood sugar in 15 minutes after treatment (to make sure it is greater than 70 mg/dL). If your blood sugar is not greater than 70 mg/dL on recheck, call [phone] for further instructions. Report your blood sugar to the short stay nurse when you get to Short Stay.  If you are admitted to the hospital after surgery: Your blood sugar will be checked by the staff and you will probably be given insulin after surgery (instead  of oral diabetes medicines) to make sure you have good blood sugar levels. The goal for blood sugar control after surgery is 80-180 mg/dL.    [facility] is not responsible for any belongings or valuables.    Do NOT Smoke (Tobacco/Vaping)  24 hours prior to your procedure  If you use a CPAP at night, you may bring your mask for your overnight stay.   Contacts, glasses, hearing aids, dentures or partials may not be worn into surgery, please bring cases for these belongings   For patients admitted to the hospital, discharge time will be determined by your treatment team.   Patients discharged the day of surgery will not be allowed to drive home, and someone needs to stay with them for 24 hours.   SURGICAL WAITING ROOM VISITATION Patients having surgery or a procedure may have no more than 2 support people in the waiting area - these visitors may rotate.   Children under the age of 61 must have an adult with them who is not the patient. If the patient needs to stay at the hospital during part of their recovery, the visitor guidelines for inpatient rooms apply. Pre-op nurse will coordinate an appropriate time for 1 support person to accompany patient in pre-op.  This support person may not rotate.   Please refer to [email or web address] for the visitor guidelines for Inpatients (after your surgery is over and you are in a regular room).    Special instructions:    Oral Hygiene is also important to reduce your risk of infection.  Remember - BRUSH YOUR TEETH THE MORNING OF SURGERY WITH YOUR REGULAR TOOTHPASTE   - Preparing For Surgery  Before surgery, you can play an important role. Because skin is not sterile, your skin needs to be as free of germs as possible. You can reduce the number of germs on your skin by washing with CHG (chlorahexidine gluconate) Soap before surgery.  CHG is an antiseptic cleaner which kills germs and  bonds with the skin to continue killing germs even after washing.     Please do not use if you have an allergy to CHG or antibacterial soaps. If your skin becomes reddened/irritated stop using the CHG.  Do not shave (including legs and underarms) for at least 48 hours prior to first CHG shower. It is OK to shave your face.  Please follow these instructions carefully.     Shower the NIGHT BEFORE SURGERY and the MORNING OF SURGERY with CHG Soap.   If you chose to wash your hair, wash your hair first as usual with your normal shampoo. After you shampoo, rinse your hair and body thoroughly to remove the shampoo.  Then [employer] and genitals (private parts) with your normal soap and rinse thoroughly to remove soap.  After that Use CHG Soap as you would any other liquid soap. You can apply CHG directly to the skin and wash gently with a scrungie or a clean washcloth.   Apply the  CHG Soap to your body ONLY FROM THE NECK DOWN.  Do not use on open wounds or open sores. Avoid contact with your eyes, ears, mouth and genitals (private parts). Wash Face and genitals (private parts)  with your normal soap.   Wash thoroughly, paying special attention to the area where your surgery will be performed.  Thoroughly rinse your body with warm water from the neck down.  DO NOT shower/wash with your normal soap after using and rinsing off the CHG Soap.  Pat yourself dry with a CLEAN TOWEL.  Wear CLEAN PAJAMAS to bed the night before surgery  Place CLEAN SHEETS on your bed the night before your surgery  DO NOT SLEEP WITH PETS.   Day of Surgery:  Take a shower with CHG soap. Wear Clean/Comfortable clothing the morning of surgery Do not wear jewelry or makeup. Do not wear lotions, powders, perfumes/cologne or deodorant. Do not shave 48 hours prior to surgery.  Men may shave face and neck. Do not bring valuables to the hospital. Do not wear nail polish, gel polish, artificial nails, or any other type of  covering on natural nails (fingers and toes) If you have artificial nails or gel coating that need to be removed by a nail salon, please have this removed prior to surgery. Artificial nails or gel coating may interfere with anesthesia's ability to adequately monitor your vital signs. Remember to brush your teeth WITH YOUR REGULAR TOOTHPASTE.    If you received a COVID test during your pre-op visit, it is requested that you wear a mask when out in public, stay away from anyone that may not be feeling well, and notify your surgeon if you develop symptoms. If you have been in contact with anyone that has tested positive in the last 10 days, please notify your surgeon.    Please read over the following fact sheets that you were given.

## 2022-03-24 ENCOUNTER — Other Ambulatory Visit: Payer: Self-pay

## 2022-03-24 ENCOUNTER — Encounter (HOSPITAL_COMMUNITY): Payer: Self-pay

## 2022-03-24 ENCOUNTER — Encounter (HOSPITAL_COMMUNITY)
Admission: RE | Admit: 2022-03-24 | Discharge: 2022-03-24 | Disposition: A | Discharge status: Home / Self Care | Payer: Medicare Other | Source: Ambulatory Visit | Attending: Neurosurgery | Admitting: Neurosurgery

## 2022-03-24 VITALS — BP 145/62 | HR 87 | Temp 98.6°F | Resp 18 | Ht 60.0 in | Wt 197.6 lb

## 2022-03-24 DIAGNOSIS — Z01818 Encounter for other preprocedural examination: Secondary | ICD-10-CM

## 2022-03-24 DIAGNOSIS — E1142 Type 2 diabetes mellitus with diabetic polyneuropathy: Secondary | ICD-10-CM | POA: Insufficient documentation

## 2022-03-24 LAB — BASIC METABOLIC PANEL
Anion gap: 11 (ref 5–15)
BUN: 14 mg/dL (ref 8–23)
CO2: 23 mmol/L (ref 22–32)
Calcium: 9.6 mg/dL (ref 8.9–10.3)
Chloride: 102 mmol/L (ref 98–111)
Creatinine, Ser: 0.65 mg/dL (ref 0.44–1.00)
GFR, Estimated: >60 mL/min (ref >60)
Glucose, Bld: 170 mg/dL — ABNORMAL HIGH (ref 70–99)
Potassium: 4.5 mmol/L (ref 3.5–5.1)
Sodium: 136 mmol/L (ref 135–145)

## 2022-03-24 LAB — GLUCOSE, CAPILLARY: Glucose-Capillary: 190 mg/dL — ABNORMAL HIGH (ref 70–99)

## 2022-03-24 LAB — TYPE AND SCREEN
ABO/RH(D): O POS
Antibody Screen: NEGATIVE

## 2022-03-24 LAB — CBC
HCT: 40.7 % (ref 36.0–46.0)
Hemoglobin: 13 g/dL (ref 12.0–15.0)
MCH: 27.1 pg (ref 26.0–34.0)
MCHC: 31.9 g/dL (ref 30.0–36.0)
MCV: 85 fL (ref 80.0–100.0)
Platelets: 318 10*3/uL (ref 150–400)
RBC: 4.79 MIL/uL (ref 3.87–5.11)
RDW: 13.8 % (ref 11.5–15.5)
WBC: 8.6 10*3/uL (ref 4.0–10.5)
nRBC: 0 % (ref 0.0–0.2)

## 2022-03-24 LAB — HEMOGLOBIN A1C
Hgb A1c MFr Bld: 10.4 % — ABNORMAL HIGH (ref 4.8–5.6)
Mean Plasma Glucose: 251.78 mg/dL

## 2022-03-24 LAB — SURGICAL PCR SCREEN
MRSA, PCR: NEGATIVE
Staphylococcus aureus: POSITIVE — AB

## 2022-03-24 NOTE — Progress Notes (Signed)
PCP - Delsa Grana, PA-C Cardiologist - denies  PPM/ICD - denies   Chest x-ray - 12/04/18 EKG - 03/24/22 Stress Test - many years ago per pt ECHO - 12/06/18 Cardiac Cath - denies  Sleep Study - 5-7 years ago, OSA+ CPAP - nightly  DM- Type 2 Fasting Blood Sugar - 140-150 Checks Blood Sugar once a day, most days  Last dose of GLP1 agonist-  n/a   Blood Thinner Instructions: n/a Aspirin Instructions: Hold 1 week. Last dose 10/20.  ERAS Protcol - yes, no drink   COVID TEST- n/a   Anesthesia review: no  Patient denies shortness of breath, fever, cough and chest pain at PAT appointment   All instructions explained to the patient, with a verbal understanding of the material. Patient agrees to go over the instructions while at home for a better understanding. The opportunity to ask questions was provided.

## 2022-03-27 ENCOUNTER — Encounter (HOSPITAL_COMMUNITY): Payer: Self-pay | Admitting: Anesthesiology

## 2022-03-27 ENCOUNTER — Encounter (HOSPITAL_COMMUNITY): Payer: Self-pay | Admitting: Vascular Surgery

## 2022-03-27 NOTE — Progress Notes (Signed)
Anesthesia Chart Review:  Case: [id number] Date/Time: 03/30/22 1215   Procedure: L3-4 PLIF - RM 19/3C   Anesthesia type: General   Pre-op diagnosis: Spondylolisthesis, Lumbar region   Location: [place] OR ROOM 61 / [place] OR   Surgeons: Ashok Pall, MD       DISCUSSION: Patient is a 66 year old female scheduled for the above procedure.  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 BMI is consistent with obesity.   A1c 10.4% (average glucose 251.78), up from 9.6% on 11/30/21 ([place]).  She is on Amaryl 2 mg twice daily, Tresiba (200 Unit/mL) 20 units Q HS, metformin XR 500 mg TID. She reported fasting CBGs ~ 140-150. At least recently, her DM has not been well controlled A1c result called to [facility] at Dr. Lacy Duverney office for review.   EKG showed incomplete right BBB, at least intermittently present since 2016. Echo in 12/2018 showed LVEF > 65%, no wall motion abnormalities, impaired relaxation, normal RVSF, mild MV leaflet thickening/mild annular calcification, mild AV sclerosis without stenosis.  She denied chest pain or shortness of breath at PAT RN visit.  If case remains as scheduled then she will get a CBG on arrival, and anesthesia team to evaluate on the day of surgery. She reported last aspirin 03/24/2022.   VS: BP (!) 145/62   Pulse 87   Temp 37 C (Oral)   Resp 18   Ht 5' (1.524 m)   Wt 89.6 kg   LMP  (LMP Unknown)   SpO2 97%   BMI 38.59 kg/m    PROVIDERS: Delsa Grana, PA-C is PCP  - Lucilla Lame, MD is endocrinologist Jef[facility] Bryant, [place]). Last visit 12/07/21. Meridee Score, MD is  orthopedic surgeon. Last visit 03/02/22 for left BKA follow-up. S/p aspiration 01/20/22 for effusion at surgical site showing calcium pyrophosphate crystals, but no bacterial growth. Pain better now. Socket had been modified for her prosthetic, no ulcers or swelling. Lysle Pearl, Isami, DO is general surgeon Jef[facility] Bryant, [facility]). She is being considered for hemorrhoid banding surgery. He ordered renal US to evaluate renal lesion on recent L-spine MRI.  - She is not followed routinely by cardiology, but had evaluation on 12/03/14 by Mertie Moores, MD for tachycardia episode. Tachycardia was only mild (104 bpm) and brief. No associated symptoms, so no further work-up recommended with as needed follow-up.    LABS: Preoperative labs noted. See DISCUSSION.  (all labs ordered are listed, but only abnormal results are displayed)  Labs Reviewed  SURGICAL PCR SCREEN - Abnormal; Notable for the following components:      Result Value   Staphylococcus aureus POSITIVE (*)    All other components within normal limits  GLUCOSE, CAPILLARY - Abnormal; Notable for the following components:   Glucose-Capillary 190 (*)    All other components within normal limits  HEMOGLOBIN A1C - Abnormal; Notable for the following components:   Hgb A1c MFr Bld 10.4 (*)    All other components within normal limits  BASIC METABOLIC PANEL - Abnormal; Notable for the following components:   Glucose, Bld 170 (*)    All other components within normal limits  CBC  TYPE AND SCREEN     IMAGES: US Renal 01/10/22 (ordered  by Benjamine Sprague, DO): IMPRESSION: 1. There is a benign cyst in the mid to LOWER pole region of the LEFT kidney, possibly representing a correlate for the mass identified on recent lumbar spine MRI. A T2 hypointense cyst can be seen in the setting of hemorrhage. However, it is difficult to definitively correlate the ultrasound findings with the MRI finding given the small size of the lesion. Therefore,  further characterization of this lesion with renal protocol MRI is recommended. 2. Normal appearance of the RIGHT kidney.   MRI L-spine 12/29/21: IMPRESSION: 1. At L3-L4, similar severe canal stenosis moderate bilateral foraminal stenosis. 2. At T12-L1, mildly progressed moderate canal stenosis. Mild right foraminal stenosis. 3. L4-L5 PLIF with patent canal and foramina at this level. 4. Aproximately 7 mm T2 hypointense left renal lesion, not typical of a simple cyst and incompletely characterized. Recommend renal ultrasound for further evaluation.   EKG: EKG 03/24/22: Normal sinus rhythm Incomplete right bundle branch block Borderline ECG When compared with ECG of 26-Nov-2008 17:05, Incomplete right bundle branch block is now Present Confirmed by End, Christopher [phone]) on 03/24/2022 9:38:14 PM - She did have an incomplete RBBB on 12/03/14 EKG tracing.   CV: TTE 12/06/18:  1. The left ventricle has hyperdynamic systolic function, with an ejection fraction of >65%. The cavity size was normal. Left ventricular diastolic Doppler parameters are consistent with impaired relaxation. Elevated left ventricular end-diastolic  pressure The E/e' is >15. No evidence of left ventricular regional wall motion abnormalities.  2. The right ventricle has normal systolic function. The cavity was normal. There is no increase in right ventricular wall thickness.  3. The mitral valve is abnormal. Mild thickening of the mitral valve leaflet. There is mild mitral annular calcification present.  4. The tricuspid valve is grossly normal.  5. The aortic valve is abnormal. Mild sclerosis of the aortic valve. No stenosis of the aortic valve.  6. The inferior vena cava was dilated in size with >50% respiratory variability.    Past Medical History:  Diagnosis Date   Anemia    Arthritis    Asthma    Colon polyps    adenomatous   Diabetes mellitus without complication ([place])    Diabetic infection of left  foot ([place]) 12/2018   Diverticulosis of colon    Esophagitis    GERD (gastroesophageal reflux disease)    Hemorrhoid    internal   Hyperlipemia    Hypertension    IBS (irritable bowel syndrome)    no current prob - diet controlled   Myalgia due to statin 11/19/2017   Neuropathy    Neuropathy of both feet    Neuropathy of hand    Pneumonia    x 4   PONV (postoperative nausea and vomiting)    on some surgeries but not all procedures   Skin ulcer of right ankle, limited to breakdown of skin ([place]) 12/31/2016   resolved per patient 05/14/19   Sleep apnea    has had in the past lost 50 pounds and do longer uses cpap   Statin intolerance 04/23/2015   Stroke ([place]) 2009   mini stroke per patient   Stroke-like episode 2009   TIA - mini stroke per patient   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 07/13/2015   Uncontrolled type 2 diabetes mellitus with hyperglycemia ([place]) 11/01/2017    Past Surgical History:  Procedure Laterality Date   ABDOMINAL HYSTERECTOMY     AMPUTATION Left 12/06/2018   Procedure: PARTIAL AMPUTATION LEFT  FOOT;  Surgeon: Edrick Kins, DPM;  Location: [place] [facility];  Service: Podiatry;  Laterality: Left;   AMPUTATION Left 05/16/2019   Procedure: LEFT BELOW KNEE AMPUTATION;  Surgeon: Newt Minion, MD;  Location: [place];  Service: Orthopedics;  Laterality: Left;   APPENDECTOMY     removed in 11th grade   BACK SURGERY  09/02/2015   BONE BIOPSY Left 12/06/2018   Procedure: Bone Biopsy;  Surgeon: Edrick Kins, DPM;  Location: [place];  Service: Podiatry;  Laterality: Left;   [place]   COLONOSCOPY N/A 11/30/2012   Procedure: COLONOSCOPY;  Surgeon: Irene Shipper, MD;  Location: [facility] ENDOSCOPY;  Service: Endoscopy;  Laterality: N/A;   COLONOSCOPY N/A 09/05/2021   Procedure: COLONOSCOPY;  Surgeon: Lesly Rubenstein, MD;  Location: [facility] ENDOSCOPY;  Service: Endoscopy;  Laterality: N/A;   COLONOSCOPY WITH PROPOFOL N/A 01/03/2016    Procedure: COLONOSCOPY WITH PROPOFOL;  Surgeon: Manya Silvas, MD;  Location[facility] ENDOSCOPY;  Service: Endoscopy;  Laterality: N/A;   ESOPHAGOGASTRODUODENOSCOPY N/A 09/05/2021   Procedure: ESOPHAGOGASTRODUODENOSCOPY (EGD);  Surgeon: Lesly Rubenstein, MD;  Location: [facility] ENDOSCOPY;  Service: Endoscopy;  Laterality: N/A;   ESOPHAGOGASTRODUODENOSCOPY (EGD) WITH PROPOFOL N/A 01/14/2015   Procedure: ESOPHAGOGASTRODUODENOSCOPY (EGD) WITH PROPOFOL;  Surgeon: Manya Silvas, MD;  Location: [facility] ENDOSCOPY;  Service: Endoscopy;  Laterality: N/A;   IRRIGATION AND DEBRIDEMENT FOOT Left 03/13/2018   Procedure: IRRIGATION AND DEBRIDEMENT FOOT WITH BONE BIOPSY WITH MISONIX DEBRIDER;  Surgeon: Evelina Bucy, DPM;  Location: [place];  Service: Podiatry;  Laterality: Left;   IRRIGATION AND DEBRIDEMENT FOOT Left 12/06/2018   Procedure: Irrigation And Debridement Foot;  Surgeon: Edrick Kins, DPM;  Location: [place];  Service: Podiatry;  Laterality: Left;   LUMBAR WOUND DEBRIDEMENT N/A 10/01/2015   Procedure: LUMBAR WOUND DEBRIDEMENT;  Surgeon: Ashok Pall, MD;  Location: [place] NEURO ORS;  Service: Neurosurgery;  Laterality: N/A;  LUMBAR WOUND DEBRIDEMENT   NASAL SINUS SURGERY     SAVORY DILATION N/A 01/14/2015   Procedure: SAVORY DILATION;  Surgeon: Manya Silvas, MD;  Location: [facility] ENDOSCOPY;  Service: Endoscopy;  Laterality: N/A;   TONSILLECTOMY     WISDOM TOOTH EXTRACTION      MEDICATIONS:  acetaminophen (TYLENOL) 500 MG tablet   albuterol (VENTOLIN HFA) 108 (90 Base) MCG/ACT inhaler   allopurinol (ZYLOPRIM) 100 MG tablet   aspirin EC 325 MG tablet   Blood Glucose Monitoring Suppl (CONTOUR NEXT MONITOR) w/Device KIT   cetirizine (ZYRTEC ALLERGY) 10 MG tablet   Colchicine 0.6 MG CAPS   docusate sodium (COLACE) 100 MG capsule   doxycycline (VIBRA-TABS) 100 MG tablet   DULoxetine (CYMBALTA) 60 MG capsule   ezetimibe (ZETIA) 10 MG tablet   fenofibrate (TRICOR) 145 MG tablet   gabapentin  (NEURONTIN) 100 MG capsule   gabapentin (NEURONTIN) 800 MG tablet   glimepiride (AMARYL) 2 MG tablet   hydrochlorothiazide (HYDRODIURIL) 25 MG tablet   hydrocortisone (ANUSOL-HC) 25 MG suppository   Incontinence Supply Disposable (ASSURANCE FITTED BRIEF LARGE) MISC   insulin degludec (TRESIBA) 200 UNIT/ML FlexTouch Pen   mesalamine (LIALDA) 1.2 g EC tablet   metFORMIN (GLUCOPHAGE-XR) 500 MG 24 hr tablet   Multiple Vitamins-Minerals (MULTIVITAMIN GUMMIES WOMENS PO)   pantoprazole (PROTONIX) 40 MG tablet   polyethylene glycol (MIRALAX / GLYCOLAX) 17 g packet   ULTICARE SHORT PEN NEEDLES 31G X 8 MM MISC   No current facility-administered medications for this encounter.  Myra Gianotti, PA-C Surgical Short Stay/Anesthesiology [facility] Phone [phone] [facility] Phone [phone] 03/27/2022 3:41 PM

## 2022-03-27 NOTE — Anesthesia Preprocedure Evaluation (Deleted)
Anesthesia Evaluation    Airway        Dental   Pulmonary           Cardiovascular hypertension,      Neuro/Psych    GI/Hepatic   Endo/Other  diabetes  Renal/GU      Musculoskeletal   Abdominal   Peds  Hematology   Anesthesia Other Findings   Reproductive/Obstetrics                             Anesthesia Physical Anesthesia Plan  ASA:   Anesthesia Plan:    Post-op Pain Management:    Induction:   PONV Risk Score and Plan:   Airway Management Planned:   Additional Equipment:   Intra-op Plan:   Post-operative Plan:   Informed Consent:   Plan Discussed with:   Anesthesia Plan Comments: (PAT note written 03/27/2022 by Myra Gianotti, PA-C. )        Anesthesia Quick Evaluation

## 2022-03-30 ENCOUNTER — Ambulatory Visit (HOSPITAL_COMMUNITY): Admission: RE | Admit: 2022-03-30 | Payer: Medicare Other | Source: Home / Self Care | Admitting: Neurosurgery

## 2022-03-30 SURGERY — POSTERIOR LUMBAR FUSION 1 LEVEL
Anesthesia: General

## 2022-04-14 ENCOUNTER — Ambulatory Visit: Payer: Medicare Other | Admitting: Podiatry

## 2022-04-14 DIAGNOSIS — M79674 Pain in right toe(s): Secondary | ICD-10-CM

## 2022-04-14 DIAGNOSIS — M14671 Charcot's joint, right ankle and foot: Secondary | ICD-10-CM

## 2022-04-14 DIAGNOSIS — E1149 Type 2 diabetes mellitus with other diabetic neurological complication: Secondary | ICD-10-CM | POA: Diagnosis not present

## 2022-04-14 DIAGNOSIS — B351 Tinea unguium: Secondary | ICD-10-CM

## 2022-04-16 NOTE — Progress Notes (Signed)
Subjective: Chief Complaint  Patient presents with   Diabetes    Diabetic A1c- 10.2,BG-140, Nail trim, foot care, TX: right foot brace    66 y.o. returns the office today for painful, elongated, thickened toenails which she cannot trim herself.  She has been getting some swelling to the foot and she knows when she wears the brace it rubs at times.  She does not see any open lesions or any blisters.    PCP: Delsa Grana, PA-C  Objective: AAO 3, NAD DP/PT pulses palpable, CRT less than 3 seconds Left below-knee amputation Sensation absent with [place] 1 to monofilament. Nails hypertrophic, dystrophic, elongated, brittle, discolored 5. There is tenderness overlying the nails 1-5 on the right. There is no surrounding erythema or drainage along the nail sites. Prominence of the fourth metatarsal head plantarly without any hyperkeratotic tissue.  There is no open lesions identified. Cavovarus foot type on the right side. No pain with calf compression, swelling, warmth, erythema.  Assessment: Patient presents with symptomatic onychomycosis; diabetic foot exam, Charcot right foot  Plan: -Treatment options including alternatives, risks, complications were discussed -Nails sharply debrided 5 without complication/bleeding. -Continue to monitor right foot daily.  Moisturizer daily. -Continue with the brace on the right side.  Discussed compression but also she states that she previous was on some different fluid pills which did better.  Discussed follow-up with primary care doctor given the edema medications may need to be adjusted. -Discussed daily foot inspection and glucose control. -Follow-up as scheduled or sooner if any problems are to arise. In the meantime, encouraged to call the office with any questions, concerns, changes symptoms.  Celesta Gentile, DPM

## 2022-04-18 ENCOUNTER — Ambulatory Visit: Payer: Self-pay | Admitting: *Deleted

## 2022-04-18 DIAGNOSIS — I340 Nonrheumatic mitral (valve) insufficiency: Secondary | ICD-10-CM | POA: Insufficient documentation

## 2022-04-18 DIAGNOSIS — R809 Proteinuria, unspecified: Secondary | ICD-10-CM | POA: Insufficient documentation

## 2022-04-18 DIAGNOSIS — Z8679 Personal history of other diseases of the circulatory system: Secondary | ICD-10-CM | POA: Insufficient documentation

## 2022-04-18 NOTE — Progress Notes (Unsigned)
Patient ID: Olivia Romero, female    DOB: 1955-11-05, 66 y.o.   MRN: [id number]  PCP: Delsa Grana, PA-C  No chief complaint on file.   Subjective:   Olivia Romero is a 66 y.o. female, presents to clinic with CC of the following:  HPI  Pt presents for over a month of swelling to whole body and mostly to leg, skin is tight, it has been gradual.  She feels tired with exertion but denies SOB No documented prior cardiac issues Hx of poorly controlled DM A1C > 10, seeing endo who has been encouraging better compliance with meds, glucose monitoring and diet efforts  She does have hx of microalbuminuria per care everywhere labs Lab Results  Component Value Date   EGFR 89 01/21/2021  03/24/2022:   eGFR >60  Lab Results  Component Value Date   ALT 13 01/21/2021   AST 14 01/21/2021   ALKPHOS 68 12/04/2018   BILITOT 0.3 01/21/2021   Htn managed with only HCTZ, recently increased from 12.5 to 25 by Dr. Rosana Berger BP Readings from Last 3 Encounters:  03/24/22 (!) 145/62  01/24/22 (!) 153/82  12/23/21 136/72   After August visit med dose was increased and pt was instructed to come for f/up in 1 month  Prior ECHO in 2020 for murmur:  Study Result     ECHOCARDIOGRAM REPORT       Patient Name:   Olivia Romero Date of Exam: 12/06/2018 Medical Rec #:  [id number]         Height:       60.0 in Accession #:    [id number]        Weight:       191.8 lb Date of Birth:  February 19, 1956         BSA:          1.83 m Patient Age:    21 years          BP:           116/57 mmHg Patient Gender: F                 HR:           90 bpm. Exam Location:  Inpatient    Procedure: 2D Echo, Cardiac Doppler and Color Doppler  Indications:    R01.1 Murmur   History:        Patient has no prior history of Echocardiogram examinations.                 Risk Factors: Hypertension and Diabetes.   Sonographer:    Raquel Sarna Senior Referring Phys: [place]    1. The left  ventricle has hyperdynamic systolic function, with an ejection fraction of >65%. The cavity size was normal. Left ventricular diastolic Doppler parameters are consistent with impaired relaxation. Elevated left ventricular end-diastolic pressure The E/e' is >15. No evidence of left ventricular regional wall motion abnormalities.  2. The right ventricle has normal systolic function. The cavity was normal. There is no increase in right ventricular wall thickness.  3. The mitral valve is abnormal. Mild thickening of the mitral valve leaflet. There is mild mitral annular calcification present.  4. The tricuspid valve is grossly normal.  5. The aortic valve is abnormal. Mild sclerosis of the aortic valve. No stenosis of the aortic valve.  6. The inferior vena cava was dilated in size with >50% respiratory variability.  SUMMARY   LVEF >  65%, normal wall thickness, normal wall motion, grade 1 DD, elevated LV filling pressure, normal RV function, aortic valve sclerosis, MAC with mitral valve thickening and trivial MR, dilated IVC that collapses   FINDINGS  Left Ventricle: The left ventricle has hyperdynamic systolic function, with an ejection fraction of >65%. The cavity size was normal. There is no increase in left ventricular wall thickness. Left ventricular diastolic Doppler parameters are consistent  with impaired relaxation.  Elevated left ventricular end-diastolic pressure The E/e' is >15. No evidence of left ventricular regional wall motion abnormalities..  Right Ventricle: The right ventricle has normal systolic function. The cavity was normal. There is no increase in right ventricular wall thickness.  Left Atrium: Left atrial size was normal in size.  Right Atrium: Right atrial size was normal in size.  Interatrial Septum: No atrial level shunt detected by color flow Doppler.  Pericardium: There is no evidence of pericardial effusion.  Mitral Valve: The mitral valve is  abnormal. Mild thickening of the mitral valve leaflet. There is mild mitral annular calcification present. Mitral valve regurgitation is trivial by color flow Doppler.  Tricuspid Valve: The tricuspid valve is grossly normal. Tricuspid valve regurgitation was not visualized by color flow Doppler.  Aortic Valve: The aortic valve is abnormal Mild sclerosis of the aortic valve. Aortic valve regurgitation was not visualized by color flow Doppler. There is No stenosis of the aortic valve.  Pulmonic Valve: The pulmonic valve was grossly normal. Pulmonic valve regurgitation is not visualized by color flow Doppler.  Venous: The inferior vena cava measures 2.34 cm, is dilated in size with greater than 50% respiratory variability.     Patient Active Problem List   Diagnosis Date Noted   Microalbuminuria 04/18/2022   Statin myopathy 11/23/2021   Left lower quadrant abdominal tenderness without rebound tenderness 08/03/2021   History of colitis 08/03/2021   Change in bowel habits 08/03/2021   Generalized abdominal pain 08/03/2021   Anemia 01/21/2021   Urticaria 10/21/2020   Rash and nonspecific skin eruption 07/27/2020   Hepatotoxicity due to statin drug 07/15/2020   Vitamin D deficiency 07/12/2020   Chronic low back pain 07/12/2020   BMI 36.0-36.9,adult 12/29/2019   Sepsis without acute organ dysfunction ([place]) 12/29/2019   Post-operative state 12/29/2019   Asthma 12/29/2019   Cervical disc disorder 12/29/2019   Displacement of cervical intervertebral disc 12/29/2019   Abnormal posture 12/29/2019   Brachial radiculitis 12/29/2019   Neck pain 12/29/2019   Pain in upper limb 12/29/2019   Whiplash injury to neck 12/29/2019   Allergy to statin medication 12/01/2019   History of below knee amputation, left ([place]) 07/30/2019   Gangrene of left foot ([place]) 05/16/2019   Charcot's joint of foot, left    PAD (peripheral artery disease) ([place]) 12/05/2018   MRSA bacteremia 12/05/2018    Charcot's joint of foot due to diabetes ([place]) 09/18/2018   Class 2 severe obesity with serious comorbidity and body mass index (BMI) of 37.0 to 37.9 in adult ([place]) 11/19/2017   B12 deficiency 11/01/2017   Diabetic polyneuropathy associated with type 2 diabetes mellitus ([place]) 12/13/2015   Lumbar stenosis with neurogenic claudication 09/02/2015   Chronic constipation 07/13/2015   OSA (obstructive sleep apnea) 06/25/2015   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/23/2015   Hyperlipidemia 04/23/2015   Statin intolerance 04/23/2015   Asthma, mild intermittent 02/01/2015   Type 2 diabetes mellitus with diabetic neuropathy, with long-term current use of insulin ([place]) 12/31/2014   Gastroesophageal reflux disease with esophagitis 12/31/2014  Dysphagia 12/31/2014   Bilateral carpal tunnel syndrome 12/01/2014   Diverticulosis of colon 11/30/2012   Benign essential hypertension 11/28/2012      Current Outpatient Medications:    acetaminophen (TYLENOL) 500 MG tablet, Take 1,000 mg by mouth every 6 (six) hours as needed for mild pain. Maximum of 3,000 mg per day, Disp: , Rfl:    albuterol (VENTOLIN HFA) 108 (90 Base) MCG/ACT inhaler, INHALE 2 PUFFS INTO THE LUNGS EVERY 4 HOURS AS NEEDED, Disp: 6.7 g, Rfl: 0   allopurinol (ZYLOPRIM) 100 MG tablet, Take 1 tablet (100 mg total) by mouth 2 (two) times daily., Disp: 60 tablet, Rfl: 3   aspirin EC 325 MG tablet, Take 1 tablet (325 mg total) by mouth daily., Disp: 30 tablet, Rfl: 0   Blood Glucose Monitoring Suppl (CONTOUR NEXT MONITOR) w/Device KIT, as directed., Disp: , Rfl:    Colchicine 0.6 MG CAPS, Take 0.6 mg by mouth 2 (two) times daily as needed., Disp: 60 capsule, Rfl: 1   DULoxetine (CYMBALTA) 60 MG capsule, Take 120 mg by mouth at bedtime., Disp: , Rfl:    ezetimibe (ZETIA) 10 MG tablet, Take 1 tablet (10 mg total) by mouth daily., Disp: 90 tablet, Rfl: 1   fenofibrate (TRICOR) 145 MG tablet, Take 1 tablet (145 mg total) by mouth  daily., Disp: 90 tablet, Rfl: 1   gabapentin (NEURONTIN) 100 MG capsule, Take 100 mg by mouth at bedtime., Disp: , Rfl:    gabapentin (NEURONTIN) 800 MG tablet, Take 800 mg by mouth 2 (two) times daily., Disp: , Rfl:    glimepiride (AMARYL) 2 MG tablet, Take 2 mg by mouth 2 (two) times daily. , Disp: , Rfl:    hydrochlorothiazide (HYDRODIURIL) 25 MG tablet, Take 1 tablet (25 mg total) by mouth daily., Disp: 90 tablet, Rfl: 0   Incontinence Supply Disposable (ASSURANCE FITTED BRIEF LARGE) MISC, Use 1 brief up to 6 times a day as needed for urinary incontinence, Disp: 180 each, Rfl: 5   insulin degludec (TRESIBA) 200 UNIT/ML FlexTouch Pen, Inject 20 Units into the skin at bedtime. , Disp: , Rfl:    mesalamine (LIALDA) 1.2 g EC tablet, Take 1.2 g by mouth daily., Disp: , Rfl:    metFORMIN (GLUCOPHAGE-XR) 500 MG 24 hr tablet, Take 500 mg by mouth 3 (three) times daily. , Disp: , Rfl:    Multiple Vitamins-Minerals (MULTIVITAMIN GUMMIES WOMENS PO), Take 1 tablet by mouth daily., Disp: , Rfl:    pantoprazole (PROTONIX) 40 MG tablet, Take 1 tablet (40 mg total) by mouth daily as needed (Heartburn). (Patient taking differently: Take 40 mg by mouth daily.), Disp: 90 tablet, Rfl: 2   polyethylene glycol (MIRALAX / GLYCOLAX) 17 g packet, Take 17 g by mouth every other day., Disp: , Rfl:    ULTICARE SHORT PEN NEEDLES 31G X 8 MM MISC, , Disp: , Rfl:    Allergies  Allergen Reactions   Eggs Or Egg-Derived Products Swelling and Other (See Comments)    Angioedema   Atorvastatin Other (See Comments)    Liver toxicity   Influenza Virus Vaccine Swelling    Arm swelled (site of injection)   Latex Itching   Pravastatin Itching and Rash   Tape Rash and Other (See Comments)    Adhesive on foot pull off skin.  Ok to use paper tape.     Social History   Tobacco Use   Smoking status: Never   Smokeless tobacco: Never  Vaping Use   Vaping Use: Never  used  Substance Use Topics   Alcohol use: Never   Drug  use: No      Chart Review Today: ***  Review of Systems     Objective:   There were no vitals filed for this visit.  There is no height or weight on file to calculate BMI.  Physical Exam   Results for orders placed or performed in visit on 04/19/22  Microalbumin / creatinine urine ratio  Result Value Ref Range   Microalb Creat Ratio 63.5        Assessment & Plan:   ***     Delsa Grana, PA-C 04/18/22 5:13 PM

## 2022-04-18 NOTE — Telephone Encounter (Signed)
Summary: body is swollen / right leg is swollen   patient is retaining fluid in her body, especially in her right leg. Patient was seen by her podiatrist on 04/21/2022 and was advised to follow up with her PCP due to her being a diabetic. Patient declined appointment for today and has been scheduled for OV with PCP for 04/19/2022.           Chief Complaint: right leg swelling and  "Whole body" Symptoms: right leg swelling when wearing leg brace noted to be too tight. Reports podiatrist reports patient appearance is swelling to "whole body". Hx Diabetic.  Frequency: 1 month ago  Pertinent Negatives: Patient denies chest pain no difficulty breathing no fever. No calf pain.  Disposition: '[]'$ ED /'[]'$ Urgent Care (no appt availability in office) / '[x]'$ Appointment(In office/virtual)/ '[]'$  [facility]/ '[]'$ Home Care/ '[]'$ Refused Recommended Disposition /'[]'$ [facility]/ '[]'$  Follow-up with PCP Additional Notes:   Appt scheduled for tomorrow due to patient needed to get transportation arranged. Please advise .        Reason for Disposition  [1] MODERATE leg swelling (e.g., swelling extends up to knees) AND [2] new-onset or worsening  Answer Assessment - Initial Assessment Questions 1. ONSET: "When did the swelling start?" (e.g., minutes, hours, days)     1 month ago  2. LOCATION: "What part of the leg is swollen?"  "Are both legs swollen or just one leg?"     Up to knee may be thigh. Whole body looks swollen  3. SEVERITY: "How bad is the swelling?" (e.g., localized; mild, moderate, severe)   - Localized: Small area of swelling localized to one leg.   - MILD pedal edema: Swelling limited to foot and ankle, pitting edema < 1/4 inch (6 mm) deep, rest and elevation eliminate most or all swelling.   - MODERATE edema: Swelling of lower leg to knee, pitting edema > 1/4 inch (6 mm) deep, rest and elevation only partially reduce swelling.   - SEVERE edema: Swelling extends above knee,  facial or hand swelling present.      Moderate- leg "hard" due to swelling and foot tighter 4. REDNESS: "Does the swelling look red or infected?"     No  5. PAIN: "Is the swelling painful to touch?" If Yes, ask: "How painful is it?"   (Scale 1-10; mild, moderate or severe)     Some pain afternoon after walking on leg  6. FEVER: "Do you have a fever?" If Yes, ask: "What is it, how was it measured, and when did it start?"      Na  7. CAUSE: "What do you think is causing the leg swelling?"     Not sure  8. MEDICAL HISTORY: "Do you have a history of blood clots (e.g., DVT), cancer, heart failure, kidney disease, or liver failure?"     na 9. RECURRENT SYMPTOM: "Have you had leg swelling before?" If Yes, ask: "When was the last time?" "What happened that time?"     na 10. OTHER SYMPTOMS: "Do you have any other symptoms?" (e.g., chest pain, difficulty breathing)       Feels tired with exertion . Denies shortness of breath 11. PREGNANCY: "Is there any chance you are pregnant?" "When was your last menstrual period?"       na  Protocols used: Leg Swelling and Edema-A-AH

## 2022-04-18 NOTE — Telephone Encounter (Signed)
Pt notified- verbalized understanding.

## 2022-04-19 ENCOUNTER — Ambulatory Visit (INDEPENDENT_AMBULATORY_CARE_PROVIDER_SITE_OTHER): Payer: Medicare Other | Admitting: Family Medicine

## 2022-04-19 ENCOUNTER — Ambulatory Visit
Admission: RE | Admit: 2022-04-19 | Discharge: 2022-04-19 | Disposition: A | Discharge status: Home / Self Care | Payer: Medicare Other | Attending: Family Medicine | Admitting: Family Medicine

## 2022-04-19 ENCOUNTER — Encounter: Payer: Self-pay | Admitting: Family Medicine

## 2022-04-19 ENCOUNTER — Ambulatory Visit
Admission: RE | Admit: 2022-04-19 | Discharge: 2022-04-19 | Disposition: A | Discharge status: Home / Self Care | Payer: Medicare Other | Source: Ambulatory Visit | Attending: Family Medicine | Admitting: Family Medicine

## 2022-04-19 VITALS — BP 124/76 | HR 92 | Temp 98.2°F | Resp 16 | Ht 60.0 in | Wt 192.5 lb

## 2022-04-19 DIAGNOSIS — Z794 Long term (current) use of insulin: Secondary | ICD-10-CM

## 2022-04-19 DIAGNOSIS — R6889 Other general symptoms and signs: Secondary | ICD-10-CM

## 2022-04-19 DIAGNOSIS — I340 Nonrheumatic mitral (valve) insufficiency: Secondary | ICD-10-CM

## 2022-04-19 DIAGNOSIS — Z8679 Personal history of other diseases of the circulatory system: Secondary | ICD-10-CM

## 2022-04-19 DIAGNOSIS — I4510 Unspecified right bundle-branch block: Secondary | ICD-10-CM

## 2022-04-19 DIAGNOSIS — E1121 Type 2 diabetes mellitus with diabetic nephropathy: Secondary | ICD-10-CM

## 2022-04-19 DIAGNOSIS — R809 Proteinuria, unspecified: Secondary | ICD-10-CM

## 2022-04-19 DIAGNOSIS — R000 Tachycardia, unspecified: Secondary | ICD-10-CM | POA: Insufficient documentation

## 2022-04-19 DIAGNOSIS — I10 Essential (primary) hypertension: Secondary | ICD-10-CM

## 2022-04-19 DIAGNOSIS — R601 Generalized edema: Secondary | ICD-10-CM | POA: Diagnosis not present

## 2022-04-20 ENCOUNTER — Other Ambulatory Visit: Payer: Self-pay | Admitting: Family Medicine

## 2022-04-20 DIAGNOSIS — R600 Localized edema: Secondary | ICD-10-CM

## 2022-04-20 LAB — CBC WITH DIFFERENTIAL/PLATELET
Absolute Monocytes: 679 cells/uL (ref 200–950)
Basophils Absolute: 77 cells/uL (ref 0–200)
Basophils Relative: 0.9 %
Eosinophils Absolute: 267 cells/uL (ref 15–500)
Eosinophils Relative: 3.1 %
HCT: 38.8 % (ref 35.0–45.0)
Hemoglobin: 12.3 g/dL (ref 11.7–15.5)
Lymphs Abs: 2434 cells/uL (ref 850–3900)
MCH: 26.5 pg — ABNORMAL LOW (ref 27.0–33.0)
MCHC: 31.7 g/dL — ABNORMAL LOW (ref 32.0–36.0)
MCV: 83.4 fL (ref 80.0–100.0)
MPV: 11.2 fL (ref 7.5–12.5)
Monocytes Relative: 7.9 %
Neutro Abs: 5143 cells/uL (ref 1500–7800)
Neutrophils Relative %: 59.8 %
Platelets: 370 10*3/uL (ref 140–400)
RBC: 4.65 10*6/uL (ref 3.80–5.10)
RDW: 14 % (ref 11.0–15.0)
Total Lymphocyte: 28.3 %
WBC: 8.6 10*3/uL (ref 3.8–10.8)

## 2022-04-20 LAB — COMPLETE METABOLIC PANEL WITH GFR
AG Ratio: 1.2 (calc) (ref 1.0–2.5)
ALT: 19 U/L (ref 6–29)
AST: 20 U/L (ref 10–35)
Albumin: 4.1 g/dL (ref 3.6–5.1)
Alkaline phosphatase (APISO): 71 U/L (ref 37–153)
BUN: 20 mg/dL (ref 7–25)
CO2: 29 mmol/L (ref 20–32)
Calcium: 9.8 mg/dL (ref 8.6–10.4)
Chloride: 101 mmol/L (ref 98–110)
Creat: 0.71 mg/dL (ref 0.50–1.05)
Globulin: 3.3 g/dL (calc) (ref 1.9–3.7)
Glucose, Bld: 191 mg/dL — ABNORMAL HIGH (ref 65–99)
Potassium: 4.7 mmol/L (ref 3.5–5.3)
Sodium: 139 mmol/L (ref 135–146)
Total Bilirubin: 0.2 mg/dL (ref 0.2–1.2)
Total Protein: 7.4 g/dL (ref 6.1–8.1)
eGFR: 95 mL/min/{1.73_m2} (ref >=60)

## 2022-04-20 LAB — MICROALBUMIN / CREATININE URINE RATIO
Creatinine, Urine: 48 mg/dL (ref 20–275)
Microalb Creat Ratio: 63 mcg/mg creat — ABNORMAL HIGH (ref <30)
Microalb, Ur: 3 mg/dL

## 2022-04-20 LAB — TSH: TSH: 0.88 mIU/L (ref 0.40–4.50)

## 2022-04-20 LAB — BRAIN NATRIURETIC PEPTIDE: Brain Natriuretic Peptide: 7 pg/mL (ref <100)

## 2022-04-20 MED ORDER — POTASSIUM CHLORIDE CRYS ER 20 MEQ PO TBCR: 20.0000 meq | EXTENDED_RELEASE_TABLET | Freq: Every day | ORAL | 0 refills | Status: DC | PRN

## 2022-04-20 MED ORDER — FUROSEMIDE 20 MG PO TABS: 20.0000 mg | ORAL_TABLET | Freq: Every day | ORAL | 0 refills | Status: DC | PRN

## 2022-04-21 ENCOUNTER — Ambulatory Visit
Admission: RE | Admit: 2022-04-21 | Discharge: 2022-04-21 | Disposition: A | Discharge status: Home / Self Care | Payer: Medicare Other | Source: Ambulatory Visit | Attending: Family Medicine | Admitting: Family Medicine

## 2022-04-21 DIAGNOSIS — R600 Localized edema: Secondary | ICD-10-CM | POA: Insufficient documentation

## 2022-05-05 ENCOUNTER — Encounter: Payer: Self-pay | Admitting: Cardiology

## 2022-05-05 ENCOUNTER — Ambulatory Visit: Payer: Medicare Other | Attending: Cardiology | Admitting: Cardiology

## 2022-05-05 VITALS — BP 120/68 | HR 87 | Ht 60.0 in | Wt 196.2 lb

## 2022-05-05 DIAGNOSIS — Z6838 Body mass index (BMI) 38.0-38.9, adult: Secondary | ICD-10-CM

## 2022-05-05 DIAGNOSIS — Z01810 Encounter for preprocedural cardiovascular examination: Secondary | ICD-10-CM

## 2022-05-05 DIAGNOSIS — R600 Localized edema: Secondary | ICD-10-CM

## 2022-05-05 MED ORDER — MOUNJARO 2.5 MG/0.5ML ~~LOC~~ SOAJ: 2.5000 mg | SUBCUTANEOUS | 0 refills | Status: DC

## 2022-05-05 MED ORDER — MOUNJARO 5 MG/0.5ML ~~LOC~~ SOAJ: 5.0000 mg | SUBCUTANEOUS | 5 refills | Status: DC

## 2022-05-05 NOTE — Progress Notes (Signed)
Cardiology Office Note:    Date:  05/05/2022   ID:  Olivia Romero, DOB [birth year], MRN [id number]  PCP:  Delsa Grana, PA-C   [facility] Providers Cardiologist:  Kate Sable, MD     Referring MD: Delsa Grana, PA-C   Chief Complaint  Patient presents with   New Patient (Initial Visit)    Edema, MVI, Family Hx HTN    History of Present Illness:    Olivia Romero is a 66 y.o. 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  She has chronic lower back pain, sciatica, back surgery being planned.  Has noticed right lower extremity edema ongoing over the past several months.  Lasix was prescribed, patient states not being compliant with Lasix.  Has a history of hyperlipidemia with elevated triglycerides, supposed to be on Zetia and fenofibrate, she has not been compliant with cholesterol medications.  She states now taking all medications as prescribed.  And hoping her cholesterol levels will be much improved on next check.  She denies chest pain or shortness of breath.  Was told she has a murmur.  Echo 12/2018 EF 65%  Past Medical History:  Diagnosis Date   Anemia    Arthritis    Asthma    Colon polyps    adenomatous   Diabetes mellitus without complication ([place])    Diabetic infection of left foot ([place]) 12/2018   Diverticulosis of colon    Esophagitis    GERD (gastroesophageal reflux disease)    Hemorrhoid    internal   Hyperlipemia    Hypertension    IBS (irritable bowel syndrome)    no current prob - diet controlled   Myalgia due to statin 11/19/2017   Neuropathy    Neuropathy of both feet    Neuropathy of hand    Pneumonia    x 4   PONV (postoperative nausea and vomiting)    on some surgeries but not all procedures   Skin ulcer of right ankle, limited to breakdown of skin ([place]) 12/31/2016   resolved per patient 05/14/19   Sleep apnea    has had in the  past lost 50 pounds and do longer uses cpap   Statin intolerance 04/23/2015   Stroke ([place]) 2009   mini stroke per patient   Stroke-like episode 2009   TIA - mini stroke per patient   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 07/13/2015   Uncontrolled type 2 diabetes mellitus with hyperglycemia ([place]) 11/01/2017    Past Surgical History:  Procedure Laterality Date   ABDOMINAL HYSTERECTOMY     AMPUTATION Left 12/06/2018   Procedure: PARTIAL AMPUTATION LEFT FOOT;  Surgeon: Edrick Kins, DPM;  Location: [facility];  Service: Podiatry;  Laterality: Left;   AMPUTATION Left 05/16/2019   Procedure: LEFT BELOW KNEE AMPUTATION;  Surgeon: Newt Minion, MD;  Location: [facility];  Service: Orthopedics;  Laterality: Left;   APPENDECTOMY     removed in 11th grade   BACK SURGERY  09/02/2015   BONE BIOPSY Left 12/06/2018   Procedure: Bone Biopsy;  Surgeon: Edrick Kins, DPM;  Location: [facility];  Service: Podiatry;  Laterality: Left;   [employer]   COLONOSCOPY N/A 11/30/2012   Procedure: COLONOSCOPY;  Surgeon: Irene Shipper, MD;  Location: [facility] ENDOSCOPY;  Service: Endoscopy;  Laterality: N/A;   COLONOSCOPY N/A 09/05/2021   Procedure:  COLONOSCOPY;  Surgeon: Lesly Rubenstein, MD;  Location[facility] ENDOSCOPY;  Service: Endoscopy;  Laterality: N/A;   COLONOSCOPY WITH PROPOFOL N/A 01/03/2016   Procedure: COLONOSCOPY WITH PROPOFOL;  Surgeon: Manya Silvas, MD;  Location: [facility] ENDOSCOPY;  Service: Endoscopy;  Laterality: N/A;   ESOPHAGOGASTRODUODENOSCOPY N/A 09/05/2021   Procedure: ESOPHAGOGASTRODUODENOSCOPY (EGD);  Surgeon: Lesly Rubenstein, MD;  [facility];  Service: Endoscopy;  Laterality: N/A;   ESOPHAGOGASTRODUODENOSCOPY (EGD) WITH PROPOFOL N/A 01/14/2015   Procedure: ESOPHAGOGASTRODUODENOSCOPY (EGD) WITH PROPOFOL;  Surgeon: Manya Silvas, MD;  Location[facility] ENDOSCOPY;  Service: Endoscopy;  Laterality: N/A;   IRRIGATION AND DEBRIDEMENT FOOT  Left 03/13/2018   Procedure: IRRIGATION AND DEBRIDEMENT FOOT WITH BONE BIOPSY WITH MISONIX DEBRIDER;  Surgeon: Evelina Bucy, DPM;  Location: [place];  Service: Podiatry;  Laterality: Left;   IRRIGATION AND DEBRIDEMENT FOOT Left 12/06/2018   Procedure: Irrigation And Debridement Foot;  Surgeon: Edrick Kins, DPM;  Location: [facility];  Service: Podiatry;  Laterality: Left;   LUMBAR WOUND DEBRIDEMENT N/A 10/01/2015   Procedure: LUMBAR WOUND DEBRIDEMENT;  Surgeon: Ashok Pall, MD;  Location: [place] NEURO ORS;  Service: Neurosurgery;  Laterality: N/A;  LUMBAR WOUND DEBRIDEMENT   NASAL SINUS SURGERY     SAVORY DILATION N/A 01/14/2015   Procedure: SAVORY DILATION;  Surgeon: Manya Silvas, MD;  Location: [facility] ENDOSCOPY;  Service: Endoscopy;  Laterality: N/A;   TONSILLECTOMY     WISDOM TOOTH EXTRACTION      Current Medications: Current Meds  Medication Sig   acetaminophen (TYLENOL) 500 MG tablet Take 1,000 mg by mouth every 6 (six) hours as needed for mild pain. Maximum of 3,000 mg per day   albuterol (VENTOLIN HFA) 108 (90 Base) MCG/ACT inhaler INHALE 2 PUFFS INTO THE LUNGS EVERY 4 HOURS AS NEEDED   allopurinol (ZYLOPRIM) 100 MG tablet Take 1 tablet (100 mg total) by mouth 2 (two) times daily.   aspirin EC 325 MG tablet Take 1 tablet (325 mg total) by mouth daily.   Blood Glucose Monitoring Suppl (CONTOUR NEXT MONITOR) w/Device KIT as directed.   Colchicine 0.6 MG CAPS Take 0.6 mg by mouth 2 (two) times daily as needed.   DULoxetine (CYMBALTA) 60 MG capsule Take 120 mg by mouth at bedtime.   ezetimibe (ZETIA) 10 MG tablet Take 1 tablet (10 mg total) by mouth daily.   fenofibrate (TRICOR) 145 MG tablet Take 1 tablet (145 mg total) by mouth daily.   furosemide (LASIX) 20 MG tablet Take 1 tablet (20 mg total) by mouth daily as needed for fluid or edema. Take in am with potassium PRN   gabapentin (NEURONTIN) 100 MG capsule Take 100 mg by mouth at bedtime.   gabapentin (NEURONTIN) 800 MG tablet Take  800 mg by mouth 2 (two) times daily.   glimepiride (AMARYL) 2 MG tablet Take 2 mg by mouth 2 (two) times daily.    hydrochlorothiazide (HYDRODIURIL) 25 MG tablet Take 1 tablet (25 mg total) by mouth daily.   Incontinence Supply Disposable (ASSURANCE FITTED BRIEF LARGE) MISC Use 1 brief up to 6 times a day as needed for urinary incontinence   insulin degludec (TRESIBA) 200 UNIT/ML FlexTouch Pen Inject 20 Units into the skin at bedtime.    mesalamine (LIALDA) 1.2 g EC tablet Take 1.2 g by mouth daily.   metFORMIN (GLUCOPHAGE-XR) 500 MG 24 hr tablet Take 500 mg by mouth 3 (three) times daily.    Multiple Vitamins-Minerals (MULTIVITAMIN GUMMIES WOMENS PO) Take 1 tablet by mouth  daily.   pantoprazole (PROTONIX) 40 MG tablet Take 1 tablet (40 mg total) by mouth daily as needed (Heartburn). (Patient taking differently: Take 40 mg by mouth daily.)   polyethylene glycol (MIRALAX / GLYCOLAX) 17 g packet Take 17 g by mouth every other day.   potassium chloride SA (KLOR-CON M) 20 MEQ tablet Take 1 tablet (20 mEq total) by mouth daily as needed (take on days when taking lasix/furosemide).   tirzepatide [facility]) 2.5 MG/0.5ML Pen Inject 2.5 mg into the skin once a week. Your refill dose will be at 5 MG once a week.   tirzepatide [facility]) 5 MG/0.5ML Pen Inject 5 mg into the skin once a week.   ULTICARE SHORT PEN NEEDLES 31G X 8 MM MISC      Allergies:   Eggs or egg-derived products, Atorvastatin, Influenza virus vaccine, Latex, Pravastatin, and Tape   Social History   Socioeconomic History   Marital status: Single    Spouse name: Not on file   Number of children: 1   Years of education: Not on file   Highest education level: Not on file  Occupational History   Occupation: disabled  Tobacco Use   Smoking status: Never   Smokeless tobacco: Never  Vaping Use   Vaping Use: Never used  Substance and Sexual Activity   Alcohol use: Never   Drug use: No   Sexual activity: Not Currently    Comment:  Hysterectomy  Other Topics Concern   Not on file  Social History Narrative   Not on file   Social Determinants of Health   Financial Resource Strain: Low Risk  (10/25/2021)   Overall Financial Resource Strain (CARDIA)    Difficulty of Paying Living Expenses: Not very hard  Food Insecurity: No Food Insecurity (01/24/2022)   Hunger Vital Sign    Worried About Running Out of Food in the Last Year: Never true    Ran Out of Food in the Last Year: Never true  Transportation Needs: Unmet Transportation Needs (01/24/2022)   PRAPARE - Transportation    Lack of Transportation (Medical): Yes    Lack of Transportation (Non-Medical): Yes  Physical Activity: Insufficiently Active (10/25/2021)   Exercise Vital Sign    Days of Exercise per Week: 7 days    Minutes of Exercise per Session: 20 min  Stress: Stress Concern Present (01/24/2022)   [employer] [place]    Feeling of Stress : To some extent  Social Connections: Moderately Integrated (01/24/2022)   Social Connection and Isolation Panel [NHANES]    Frequency of Communication with Friends and Family: More than three times a week    Frequency of Social Gatherings with Friends and Family: Once a week    Attends Religious Services: More than 4 times per year    Active Member of [employer] or Organizations: Yes    Attends Music therapist: More than 4 times per year    Marital Status: Never married     Family History: The patient's family history includes Arthritis in her brother and father; Hypertension in her father and mother; Kidney cancer in her mother; Lung cancer in her father; Other in her mother; Rheum arthritis in her maternal uncle; Stroke in her mother. There is no history of Bladder Cancer.  ROS:   Please see the history of present illness.     All other systems reviewed and are negative.  EKGs/Labs/Other Studies Reviewed:    The following studies were reviewed  today:  EKG:  EKG is  ordered today.  The ekg ordered today demonstrates EKG normal sinus rhythm,  Recent Labs: 04/19/2022: ALT 19; Brain Natriuretic Peptide 7; BUN 20; Creat 0.71; Hemoglobin 12.3; Platelets 370; Potassium 4.7; Sodium 139; TSH 0.88  Recent Lipid Panel    Component Value Date/Time   CHOL 194 01/21/2021 1042   CHOL 292 (H) 02/22/2015 1016   TRIG 255 (H) 01/21/2021 1042   HDL 44 (L) 01/21/2021 1042   HDL 24 (L) 02/22/2015 1016   CHOLHDL 4.4 01/21/2021 1042   VLDL NOT CALC 09/20/2016 1141   LDLCALC 114 (H) 01/21/2021 1042     Risk Assessment/Calculations:               Physical Exam:    VS:  BP 120/68 (BP Location: Left Arm) Comment (BP Location): forearm  Pulse 87   Ht 5' (1.524 m)   Wt 196 lb 3.2 oz (89 kg)   LMP  (LMP Unknown)   SpO2 98%   BMI 38.32 kg/m     Wt Readings from Last 3 Encounters:  05/05/22 196 lb 3.2 oz (89 kg)  04/19/22 192 lb 8 oz (87.3 kg)  03/24/22 197 lb 9.6 oz (89.6 kg)     GEN:  Well nourished, well developed in no acute distress HEENT: Normal NECK: No JVD; No carotid bruits CARDIAC: RRR, no murmurs, rubs, gallops RESPIRATORY:  Clear to auscultation without rales, wheezing or rhonchi  ABDOMEN: Soft, non-tender, non-distended MUSCULOSKELETAL: 1+ right lower extremity edema, left BKA noted, prosthesis. SKIN: Warm and dry NEUROLOGIC:  Alert and oriented x 3 PSYCHIATRIC:  Normal affect   ASSESSMENT:    1. Leg edema   2. BMI 38.0-38.9,adult   3. Pre-operative cardiovascular examination    PLAN:    In order of problems listed above:  Right lower extremity edema, previous echo with preserved EF.  Get echo to evaluate any change in cardiac function or new significant valvular disease.  No significant murmur noted on exam.  Obesity could be contributing. Obesity, diabetic, history of diabetic complication/infection s/p left BKA.  Start Mounjaro, low calorie diet, weight loss recommended. Preop evaluation, patient denies  chest pain or shortness of breath.  Echo as above, if echo shows no gross abnormalities, okay to proceed with back surgery from a cardiac perspective.  Follow-up after echocardiogram      Medication Adjustments/Labs and Tests Ordered: Current medicines are reviewed at length with the patient today.  Concerns regarding medicines are outlined above.  Orders Placed This Encounter  Procedures   EKG 12-Lead   ECHOCARDIOGRAM COMPLETE   Meds ordered this encounter  Medications   tirzepatide (MOUNJARO) 2.5 MG/0.5ML Pen    Sig: Inject 2.5 mg into the skin once a week. Your refill dose will be at 5 MG once a week.    Dispense:  2 mL    Refill:  0   tirzepatide (MOUNJARO) 5 MG/0.5ML Pen    Sig: Inject 5 mg into the skin once a week.    Dispense:  2 mL    Refill:  5    Patient Instructions  Medication Instructions:   Your physician has recommended you make the following change in your medication:    START taking Tirzepatide (Mounjaro) 2.5 MG once a week for 4 weeks, then take 5 MG once a week for 4 weeks. (Call us when you have 2 weeks left at the 5 MG dose so we can send in the next dose).   *If  you need a refill on your cardiac medications before your next appointment, please call your pharmacy*   Testing/Procedures:  Your physician has requested that you have an echocardiogram. Echocardiography is a painless test that uses sound waves to create images of your heart. It provides your doctor with information about the size and shape of your heart and how well your heart's chambers and valves are working. This procedure takes approximately one hour. There are no restrictions for this procedure. Please do NOT wear cologne, perfume, aftershave, or lotions (deodorant is allowed). Please arrive 15 minutes prior to your appointment time.    Follow-Up: At [facility], you and your health needs are our priority.  As part of our continuing mission to provide you with exceptional  heart care, we have created designated Provider Care Teams.  These Care Teams include your primary Cardiologist (physician) and Advanced Practice Providers (APPs -  Physician Assistants and Nurse Practitioners) who all work together to provide you with the care you need, when you need it.  We recommend signing up for the patient portal called "MyChart".  Sign up information is provided on this After Visit Summary.  MyChart is used to connect with patients for Virtual Visits (Telemedicine).  Patients are able to view lab/test results, encounter notes, upcoming appointments, etc.  Non-urgent messages can be sent to your provider as well.   To learn more about what you can do with MyChart, go to [email or web address].    Your next appointment:   6 week(s)  The format for your next appointment:   In Person  Provider:    ONLY WITH Kate Sable, MD    Other Instructions   Important Information About Sugar         Signed, Kate Sable, MD  05/05/2022 10:49 AM    [place]

## 2022-05-05 NOTE — Patient Instructions (Signed)
Medication Instructions:   Your physician has recommended you make the following change in your medication:    START taking Tirzepatide (Mounjaro) 2.5 MG once a week for 4 weeks, then take 5 MG once a week for 4 weeks. (Call us when you have 2 weeks left at the 5 MG dose so we can send in the next dose).   *If you need a refill on your cardiac medications before your next appointment, please call your pharmacy*   Testing/Procedures:  Your physician has requested that you have an echocardiogram. Echocardiography is a painless test that uses sound waves to create images of your heart. It provides your doctor with information about the size and shape of your heart and how well your heart's chambers and valves are working. This procedure takes approximately one hour. There are no restrictions for this procedure. Please do NOT wear cologne, perfume, aftershave, or lotions (deodorant is allowed). Please arrive 15 minutes prior to your appointment time.    Follow-Up: At [facility], you and your health needs are our priority.  As part of our continuing mission to provide you with exceptional heart care, we have created designated Provider Care Teams.  These Care Teams include your primary Cardiologist (physician) and Advanced Practice Providers (APPs -  Physician Assistants and Nurse Practitioners) who all work together to provide you with the care you need, when you need it.  We recommend signing up for the patient portal called "MyChart".  Sign up information is provided on this After Visit Summary.  MyChart is used to connect with patients for Virtual Visits (Telemedicine).  Patients are able to view lab/test results, encounter notes, upcoming appointments, etc.  Non-urgent messages can be sent to your provider as well.   To learn more about what you can do with MyChart, go to [email or web address].    Your next appointment:   6 week(s)  The format for your next appointment:   In  Person  Provider:    ONLY WITH Kate Sable, MD    Other Instructions   Important Information About Sugar

## 2022-06-08 ENCOUNTER — Encounter: Payer: Self-pay | Admitting: Orthopedic Surgery

## 2022-06-08 ENCOUNTER — Ambulatory Visit (INDEPENDENT_AMBULATORY_CARE_PROVIDER_SITE_OTHER): Payer: Medicare Other | Admitting: Orthopedic Surgery

## 2022-06-08 DIAGNOSIS — Z89512 Acquired absence of left leg below knee: Secondary | ICD-10-CM

## 2022-06-08 NOTE — Progress Notes (Signed)
Office Visit Note   Patient: Olivia Romero           Date of Birth: 1956-04-06           MRN: [id number] Visit Date: 06/08/2022              Requested by: Delsa Grana, PA-C [address] [place],  [place] [address] PCP: Delsa Grana, PA-C  Chief Complaint  Patient presents with   Left Leg - Follow-up    Hx bka      HPI: Patient is a 67 year old woman who is status post left transtibial amputation.  Patient states she has developed blistering and rashing on the residual limb.  She states that it gets worse when she does not use her Vive wear prosthetic under liner and has gotten worse when she uses the white liner liner.  Assessment & Plan: Visit Diagnoses:  1. Hx of BKA, left ([place])     Plan: Patient will use the 5 prostatic under liner and change as needed.  Follow-Up Instructions: No follow-ups on file.   Ortho Exam  Patient is alert, oriented, no adenopathy, well-dressed, normal affect, normal respiratory effort. Examination patient has dermatitis and small areas of fungal rash on the residual limb there is no cellulitis no open wounds no swelling no signs of infection.  Most recent hemoglobin A1c is 10.4.  Imaging: No results found. No images are attached to the encounter.  Labs: Lab Results  Component Value Date   HGBA1C 10.4 (H) 03/24/2022   HGBA1C 9.6 11/30/2021   HGBA1C 8.7 06/21/2021   ESRSEDRATE 60 (H) 12/05/2018   ESRSEDRATE 41 (H) 12/04/2018   ESRSEDRATE 30 07/12/2018   CRP 22.0 (H) 12/05/2018   CRP 20 (H) 12/04/2018   CRP 6 07/12/2018   LABURIC 5.4 04/22/2020   LABURIC 8.7 (H) 03/25/2020   LABURIC 8.2 (H) 11/30/2015   REPTSTATUS 12/12/2018 FINAL 12/06/2018   GRAMSTAIN  12/06/2018    RARE WBC PRESENT, PREDOMINANTLY PMN NO ORGANISMS SEEN    CULT  12/06/2018    RARE METHICILLIN RESISTANT STAPHYLOCOCCUS AUREUS WITH NORMAL SKIN FLORA NO ANAEROBES ISOLATED Performed at [facility], [place] [address], [place], [place]  [address]    LABORGA METHICILLIN RESISTANT STAPHYLOCOCCUS AUREUS 12/06/2018     Lab Results  Component Value Date   ALBUMIN 3.7 12/04/2018   ALBUMIN 4.0 11/27/2018   ALBUMIN 3.8 03/11/2018   PREALBUMIN 21.5 03/12/2018    No results found for: "MG" Lab Results  Component Value Date   VD25OH 11 (L) 09/20/2016    Lab Results  Component Value Date   PREALBUMIN 21.5 03/12/2018      Latest Ref Rng & Units 04/19/2022   11:33 AM 03/24/2022   10:23 AM 12/23/2021    3:58 PM  CBC EXTENDED  WBC 3.8 - 10.8 Thousand/uL 8.6  8.6  9.3   RBC 3.80 - 5.10 Million/uL 4.65  4.79  4.89   Hemoglobin 11.7 - 15.5 g/dL 12.3  13.0  13.3   HCT 35.0 - 45.0 % 38.8  40.7  41.1   Platelets 140 - 400 Thousand/uL 370  318  336   NEUT# 1,500 - 7,800 cells/uL 5,143   5,357   Lymph# 850 - 3,900 cells/uL 2,434   2,920      There is no height or weight on file to calculate BMI.  Orders:  No orders of the defined types were placed in this encounter.  No orders of the defined types  were placed in this encounter.    Procedures: No procedures performed  Clinical Data: No additional findings.  ROS:  All other systems negative, except as noted in the HPI. Review of Systems  Objective: Vital Signs: LMP  (LMP Unknown)   Specialty Comments:  No specialty comments available.  PMFS History: Patient Active Problem List   Diagnosis Date Noted   Microalbuminuria 04/18/2022   History of diastolic dysfunction 02/25/3006   Mitral valve insufficiency 04/18/2022   Statin myopathy 11/23/2021   Left lower quadrant abdominal tenderness without rebound tenderness 08/03/2021   History of colitis 08/03/2021   Change in bowel habits 08/03/2021   Generalized abdominal pain 08/03/2021   Anemia 01/21/2021   Urticaria 10/21/2020   Rash and nonspecific skin eruption 07/27/2020   Hepatotoxicity due to statin drug 07/15/2020   Vitamin D deficiency 07/12/2020   Chronic low back pain 07/12/2020   BMI  36.0-36.9,adult 12/29/2019   Sepsis without acute organ dysfunction ([place]) 12/29/2019   Post-operative state 12/29/2019   Asthma 12/29/2019   Cervical disc disorder 12/29/2019   Displacement of cervical intervertebral disc 12/29/2019   Abnormal posture 12/29/2019   Brachial radiculitis 12/29/2019   Neck pain 12/29/2019   Pain in upper limb 12/29/2019   Whiplash injury to neck 12/29/2019   Allergy to statin medication 12/01/2019   History of below knee amputation, left ([place]) 07/30/2019   Gangrene of left foot ([place]) 05/16/2019   Charcot's joint of foot, left    PAD (peripheral artery disease) ([place]) 12/05/2018   MRSA bacteremia 12/05/2018   Charcot's joint of foot due to diabetes ([place]) 09/18/2018   Class 2 severe obesity with serious comorbidity and body mass index (BMI) of 37.0 to 37.9 in adult ([place]) 11/19/2017   B12 deficiency 11/01/2017   Diabetic polyneuropathy associated with type 2 diabetes mellitus ([place]) 12/13/2015   Lumbar stenosis with neurogenic claudication 09/02/2015   Chronic constipation 07/13/2015   OSA (obstructive sleep apnea) 06/25/2015   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/23/2015   Hyperlipidemia 04/23/2015   Statin intolerance 04/23/2015   Asthma, mild intermittent 02/01/2015   Type 2 diabetes mellitus with diabetic neuropathy, with long-term current use of insulin ([place]) 12/31/2014   Gastroesophageal reflux disease with esophagitis 12/31/2014   Dysphagia 12/31/2014   Bilateral carpal tunnel syndrome 12/01/2014   Diverticulosis of colon 11/30/2012   Benign essential hypertension 11/28/2012   Past Medical History:  Diagnosis Date   Anemia    Arthritis    Asthma    Colon polyps    adenomatous   Diabetes mellitus without complication ([place])    Diabetic infection of left foot ([place]) 12/2018   Diverticulosis of colon    Esophagitis    GERD (gastroesophageal reflux disease)    Hemorrhoid    internal   Hyperlipemia    Hypertension    IBS  (irritable bowel syndrome)    no current prob - diet controlled   Myalgia due to statin 11/19/2017   Neuropathy    Neuropathy of both feet    Neuropathy of hand    Pneumonia    x 4   PONV (postoperative nausea and vomiting)    on some surgeries but not all procedures   Skin ulcer of right ankle, limited to breakdown of skin ([place]) 12/31/2016   resolved per patient 05/14/19   Sleep apnea    has had in the past lost 50 pounds and do longer uses cpap   Statin intolerance 04/23/2015   Stroke ([place]) 2009  mini stroke per patient   Stroke-like episode 2009   TIA - mini stroke per patient   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 07/13/2015   Uncontrolled type 2 diabetes mellitus with hyperglycemia ([place]) 11/01/2017    Family History  Problem Relation Age of Onset   Lung cancer Father    Hypertension Father    Arthritis Father    Other Mother        hardening of the arteries/renal cell carcenoma   Hypertension Mother    Stroke Mother    Kidney cancer Mother    Arthritis Brother    Rheum arthritis Maternal Uncle    Bladder Cancer Neg Hx     Past Surgical History:  Procedure Laterality Date   ABDOMINAL HYSTERECTOMY     AMPUTATION Left 12/06/2018   Procedure: PARTIAL AMPUTATION LEFT FOOT;  Surgeon: Edrick Kins, DPM;  Location: [facility];  Service: Podiatry;  Laterality: Left;   AMPUTATION Left 05/16/2019   Procedure: LEFT BELOW KNEE AMPUTATION;  Surgeon: Newt Minion, MD;  Location: [facility];  Service: Orthopedics;  Laterality: Left;   APPENDECTOMY     removed in 11th grade   BACK SURGERY  09/02/2015   BONE BIOPSY Left 12/06/2018   Procedure: Bone Biopsy;  Surgeon: Edrick Kins, DPM;  Location: [employer];  Service: Podiatry;  Laterality: Left;   [place]   COLONOSCOPY N/A 11/30/2012   Procedure: COLONOSCOPY;  Surgeon: Irene Shipper, MD;  Location: [facility]COPY;  Service: Endoscopy;  Laterality: N/A;   COLONOSCOPY N/A 09/05/2021    Procedure: COLONOSCOPY;  Surgeon: Lesly Rubenstein, MD;  Location: [facility] ENDOSCOPY;  Service: Endoscopy;  Laterality: N/A;   COLONOSCOPY WITH PROPOFOL N/A 01/03/2016   Procedure: COLONOSCOPY WITH PROPOFOL;  Surgeon: Manya Silvas, MD;  Location: [facility] ENDOSCOPY;  Service: Endoscopy;  Laterality: N/A;   ESOPHAGOGASTRODUODENOSCOPY N/A 09/05/2021   Procedure: ESOPHAGOGASTRODUODENOSCOPY (EGD);  Surgeon: Lesly Rubenstein, MD;  Location[facility] ENDOSCOPY;  Service: Endoscopy;  Laterality: N/A;   ESOPHAGOGASTRODUODENOSCOPY (EGD) WITH PROPOFOL N/A 01/14/2015   Procedure: ESOPHAGOGASTRODUODENOSCOPY (EGD) WITH PROPOFOL;  Surgeon: Manya Silvas, MD;  Location: [facility] ENDOSCOPY;  Service: Endoscopy;  Laterality: N/A;   IRRIGATION AND DEBRIDEMENT FOOT Left 03/13/2018   Procedure: IRRIGATION AND DEBRIDEMENT FOOT WITH BONE BIOPSY WITH MISONIX DEBRIDER;  Surgeon: Evelina Bucy, DPM;  Location: [facility];  Service: Podiatry;  Laterality: Left;   IRRIGATION AND DEBRIDEMENT FOOT Left 12/06/2018   Procedure: Irrigation And Debridement Foot;  Surgeon: Edrick Kins, DPM;  Location: [facility];  Service: Podiatry;  Laterality: Left;   LUMBAR WOUND DEBRIDEMENT N/A 10/01/2015   Procedure: LUMBAR WOUND DEBRIDEMENT;  Surgeon: Ashok Pall, MD;  Location: [facility]URO ORS;  Service: Neurosurgery;  Laterality: N/A;  LUMBAR WOUND DEBRIDEMENT   NASAL SINUS SURGERY     SAVORY DILATION N/A 01/14/2015   Procedure: SAVORY DILATION;  Surgeon: Manya Silvas, MD;  Location: [facility] ENDOSCOPY;  Service: Endoscopy;  Laterality: N/A;   TONSILLECTOMY     WISDOM TOOTH EXTRACTION     Social History   Occupational History   Occupation: disabled  Tobacco Use   Smoking status: Never   Smokeless tobacco: Never  Vaping Use   Vaping Use: Never used  Substance and Sexual Activity   Alcohol use: Never   Drug use: No   Sexual activity: Not Currently    Comment: Hysterectomy

## 2022-06-13 DIAGNOSIS — E113293 Type 2 diabetes mellitus with mild nonproliferative diabetic retinopathy without macular edema, bilateral: Secondary | ICD-10-CM | POA: Diagnosis not present

## 2022-06-13 DIAGNOSIS — Z794 Long term (current) use of insulin: Secondary | ICD-10-CM | POA: Diagnosis not present

## 2022-06-13 DIAGNOSIS — E1165 Type 2 diabetes mellitus with hyperglycemia: Secondary | ICD-10-CM | POA: Diagnosis not present

## 2022-06-13 DIAGNOSIS — E1142 Type 2 diabetes mellitus with diabetic polyneuropathy: Secondary | ICD-10-CM | POA: Diagnosis not present

## 2022-06-13 DIAGNOSIS — E1129 Type 2 diabetes mellitus with other diabetic kidney complication: Secondary | ICD-10-CM | POA: Diagnosis not present

## 2022-06-13 DIAGNOSIS — R809 Proteinuria, unspecified: Secondary | ICD-10-CM | POA: Diagnosis not present

## 2022-06-13 DIAGNOSIS — E1169 Type 2 diabetes mellitus with other specified complication: Secondary | ICD-10-CM | POA: Diagnosis not present

## 2022-06-27 ENCOUNTER — Ambulatory Visit: Payer: Medicare Other | Attending: Cardiology

## 2022-06-27 DIAGNOSIS — R600 Localized edema: Secondary | ICD-10-CM | POA: Diagnosis not present

## 2022-06-28 ENCOUNTER — Encounter: Payer: Self-pay | Admitting: Pharmacist

## 2022-06-28 LAB — ECHOCARDIOGRAM COMPLETE
AR max vel: 2 cm2
AV Area VTI: 1.86 cm2
AV Area mean vel: 2.08 cm2
AV Mean grad: 7 mmHg
AV Peak grad: 14.3 mmHg
Ao pk vel: 1.89 m/s
Area-P 1/2: 3.53 cm2
Single Plane A4C EF: 54.7 %

## 2022-06-28 NOTE — Progress Notes (Signed)
[facility] Statin Quality Measure Assessment   06/28/2022  MICHE LOUGHRIDGE 07-21-1955 [id number]  Per review of chart and payor information, Ms. Hoglund as a diagnosis of diabetes but is not currently filling a statin prescription.  This places patient into the Statin Use In Patients with Diabetes (SUPD) measure for CMS.    Patient has documented trials of statins with reported liver issues, but no corresponding CPT codes that would exclude patient from SUPD measure.Please consider evaluating her past statin intolerance and code accordingly at her office visit tomorrow if clinically appropriate.    Please consider ONE of the following recommendations:  Code for past statin intolerance or  other exclusions (required annually)  Provider Requirements: Associate code during an office visit or telehealth encounter  Drug Induced Myopathy G72.0   Myopathy, unspecified G72.9   Myositis, unspecified M60.9   Rhabdomyolysis M62.82   Cirrhosis of liver K74.69   Prediabetes R73.03   PCOS E28.2   Thank you for allowing [facility] pharmacy to be a part of this patient's care. Curlene Labrum, PharmD [place] Pharmacist Office: [phone]

## 2022-06-29 ENCOUNTER — Ambulatory Visit: Payer: Medicare Other | Attending: Cardiology | Admitting: Cardiology

## 2022-06-29 ENCOUNTER — Encounter: Payer: Self-pay | Admitting: Cardiology

## 2022-06-29 VITALS — BP 138/68 | HR 80 | Ht 60.0 in | Wt 186.4 lb

## 2022-06-29 DIAGNOSIS — Z6836 Body mass index (BMI) 36.0-36.9, adult: Secondary | ICD-10-CM

## 2022-06-29 DIAGNOSIS — Z01810 Encounter for preprocedural cardiovascular examination: Secondary | ICD-10-CM | POA: Diagnosis not present

## 2022-06-29 DIAGNOSIS — E782 Mixed hyperlipidemia: Secondary | ICD-10-CM | POA: Diagnosis not present

## 2022-06-29 DIAGNOSIS — R600 Localized edema: Secondary | ICD-10-CM

## 2022-06-29 NOTE — Progress Notes (Signed)
Cardiology Office Note:    Date:  06/29/2022   ID:  ASHELYNN Romero, DOB 29-Oct-1955, MRN [id number]  PCP:  Delsa Grana, PA-C   [facility] Cardiologist:  Kate Sable, MD     Referring MD: Delsa Grana, PA-C   Chief Complaint  Patient presents with   Follow-up    Testing f/u, no new cardiac concerns    History of Present Illness:    Olivia Romero is a 67 y.o. 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  Previously seen for preop evaluation, chronic lower back pain, back surgery being planned.  Denies chest pain or shortness of breath.  Echocardiogram was obtained which revealed normal systolic function.  Due to obesity, diabetes, Darcel Bayley was started.  She has lost about 10 pounds since last visit.  She states her back pain has improved with weight loss.  Feels well, has no concerns at this time.  Compliant with medications as prescribed.  Prior notes Echo 06/2022 EF 60 to 65% Echo 12/2018 EF 65%  Past Medical History:  Diagnosis Date   Anemia    Arthritis    Asthma    Colon polyps    adenomatous   Diabetes mellitus without complication ([place])    Diabetic infection of left foot ([place]) 12/2018   Diverticulosis of colon    Esophagitis    GERD (gastroesophageal reflux disease)    Hemorrhoid    internal   Hyperlipemia    Hypertension    IBS (irritable bowel syndrome)    no current prob - diet controlled   Myalgia due to statin 11/19/2017   Neuropathy    Neuropathy of both feet    Neuropathy of hand    Pneumonia    x 4   PONV (postoperative nausea and vomiting)    on some surgeries but not all procedures   Skin ulcer of right ankle, limited to breakdown of skin ([place]) 12/31/2016   resolved per patient 05/14/19   Sleep apnea    has had in the past lost 50 pounds and do longer uses cpap   Statin intolerance 04/23/2015   Stroke ([place]) 2009   mini stroke per  patient   Stroke-like episode 2009   TIA - mini stroke per patient   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 07/13/2015   Uncontrolled type 2 diabetes mellitus with hyperglycemia ([place]) 11/01/2017    Past Surgical History:  Procedure Laterality Date   ABDOMINAL HYSTERECTOMY     AMPUTATION Left 12/06/2018   Procedure: PARTIAL AMPUTATION LEFT FOOT;  Surgeon: Edrick Kins, DPM;  Location: [place];  Service: Podiatry;  Laterality: Left;   AMPUTATION Left 05/16/2019   Procedure: LEFT BELOW KNEE AMPUTATION;  Surgeon: Newt Minion, MD;  Location: [place];  Service: Orthopedics;  Laterality: Left;   APPENDECTOMY     removed in 11th grade   BACK SURGERY  09/02/2015   BONE BIOPSY Left 12/06/2018   Procedure: Bone Biopsy;  Surgeon: Edrick Kins, DPM;  Location: [place];  Service: Podiatry;  Laterality: Left;   [place]   COLONOSCOPY N/A 11/30/2012   Procedure: COLONOSCOPY;  Surgeon: Irene Shipper, MD;  Location: [facility] ENDOSCOPY;  Service: Endoscopy;  Laterality: N/A;   COLONOSCOPY N/A 09/05/2021   Procedure: COLONOSCOPY;  Surgeon: Lesly Rubenstein, MD;  Location: [facility] ENDOSCOPY;  Service: Endoscopy;  Laterality: N/A;   COLONOSCOPY  WITH PROPOFOL N/A 01/03/2016   Procedure: COLONOSCOPY WITH PROPOFOL;  Surgeon: Manya Silvas, MD;  Location: [facility] ENDOSCOPY;  Service: Endoscopy;  Laterality: N/A;   ESOPHAGOGASTRODUODENOSCOPY N/A 09/05/2021   Procedure: ESOPHAGOGASTRODUODENOSCOPY (EGD);  Surgeon: Lesly Rubenstein, MD;  Location: [facility] ENDOSCOPY;  Service: Endoscopy;  Laterality: N/A;   ESOPHAGOGASTRODUODENOSCOPY (EGD) WITH PROPOFOL N/A 01/14/2015   Procedure: ESOPHAGOGASTRODUODENOSCOPY (EGD) WITH PROPOFOL;  Surgeon: Manya Silvas, MD;  [facility] ENDOSCOPY;  Service: Endoscopy;  Laterality: N/A;   IRRIGATION AND DEBRIDEMENT FOOT Left 03/13/2018   Procedure: IRRIGATION AND DEBRIDEMENT FOOT WITH BONE BIOPSY WITH MISONIX DEBRIDER;  Surgeon: Evelina Bucy, DPM;  Location: [place];  Service: Podiatry;  Laterality: Left;   IRRIGATION AND DEBRIDEMENT FOOT Left 12/06/2018   Procedure: Irrigation And Debridement Foot;  Surgeon: Edrick Kins, DPM;  Location: [place];  Service: Podiatry;  Laterality: Left;   LUMBAR WOUND DEBRIDEMENT N/A 10/01/2015   Procedure: LUMBAR WOUND DEBRIDEMENT;  Surgeon: Ashok Pall, MD;  Location: [place] NEURO ORS;  Service: Neurosurgery;  Laterality: N/A;  LUMBAR WOUND DEBRIDEMENT   NASAL SINUS SURGERY     SAVORY DILATION N/A 01/14/2015   Procedure: SAVORY DILATION;  Surgeon: Manya Silvas, MD;  Location: [facility] ENDOSCOPY;  Service: Endoscopy;  Laterality: N/A;   TONSILLECTOMY     WISDOM TOOTH EXTRACTION      Current Medications: Current Meds  Medication Sig   acetaminophen (TYLENOL) 500 MG tablet Take 1,000 mg by mouth every 6 (six) hours as needed for mild pain. Maximum of 3,000 mg per day   albuterol (VENTOLIN HFA) 108 (90 Base) MCG/ACT inhaler INHALE 2 PUFFS INTO THE LUNGS EVERY 4 HOURS AS NEEDED   allopurinol (ZYLOPRIM) 100 MG tablet Take 1 tablet (100 mg total) by mouth 2 (two) times daily.   aspirin EC 325 MG tablet Take 1 tablet (325 mg total) by mouth daily.   Blood Glucose Monitoring Suppl (CONTOUR NEXT MONITOR) w/Device KIT as directed.   Colchicine 0.6 MG CAPS Take 0.6 mg by mouth 2 (two) times daily as needed.   DULoxetine (CYMBALTA) 60 MG capsule Take 120 mg by mouth at bedtime.   ezetimibe (ZETIA) 10 MG tablet Take 1 tablet (10 mg total) by mouth daily.   fenofibrate (TRICOR) 145 MG tablet Take 1 tablet (145 mg total) by mouth daily.   furosemide (LASIX) 20 MG tablet Take 1 tablet (20 mg total) by mouth daily as needed for fluid or edema. Take in am with potassium PRN   gabapentin (NEURONTIN) 100 MG capsule Take 100 mg by mouth at bedtime.   gabapentin (NEURONTIN) 800 MG tablet Take 800 mg by mouth 2 (two) times daily.   glimepiride (AMARYL) 2 MG tablet Take 2 mg by mouth 2 (two) times daily.     hydrochlorothiazide (HYDRODIURIL) 25 MG tablet Take 1 tablet (25 mg total) by mouth daily.   Incontinence Supply Disposable (ASSURANCE FITTED BRIEF LARGE) MISC Use 1 brief up to 6 times a day as needed for urinary incontinence   insulin degludec (TRESIBA) 200 UNIT/ML FlexTouch Pen Inject 20 Units into the skin at bedtime.    mesalamine (LIALDA) 1.2 g EC tablet Take 1.2 g by mouth daily.   metFORMIN (GLUCOPHAGE-XR) 500 MG 24 hr tablet Take 500 mg by mouth 3 (three) times daily.    Multiple Vitamins-Minerals (MULTIVITAMIN GUMMIES WOMENS PO) Take 1 tablet by mouth daily.   pantoprazole (PROTONIX) 40 MG tablet Take 1 tablet (40 mg total) by mouth daily as needed (Heartburn). (  Patient taking differently: Take 40 mg by mouth daily.)   polyethylene glycol (MIRALAX / GLYCOLAX) 17 g packet Take 17 g by mouth every other day.   potassium chloride SA (KLOR-CON M) 20 MEQ tablet Take 1 tablet (20 mEq total) by mouth daily as needed (take on days when taking lasix/furosemide).   tirzepatide [facility]) 2.5 MG/0.5ML Pen Inject 2.5 mg into the skin once a week. Your refill dose will be at 5 MG once a week.   tirzepatide [facility]) 5 MG/0.5ML Pen Inject 5 mg into the skin once a week.   ULTICARE SHORT PEN NEEDLES 31G X 8 MM MISC      Allergies:   Eggs or egg-derived products, Atorvastatin, Influenza virus vaccine, Latex, Pravastatin, and Tape   Social History   Socioeconomic History   Marital status: Single    Spouse name: Not on file   Number of children: 1   Years of education: Not on file   Highest education level: Not on file  Occupational History   Occupation: disabled  Tobacco Use   Smoking status: Never   Smokeless tobacco: Never  Vaping Use   Vaping Use: Never used  Substance and Sexual Activity   Alcohol use: Never   Drug use: No   Sexual activity: Not Currently    Comment: Hysterectomy  Other Topics Concern   Not on file  Social History Narrative   Not on file   Social Determinants  of Health   Financial Resource Strain: Low Risk  (10/25/2021)   Overall Financial Resource Strain (CARDIA)    Difficulty of Paying Living Expenses: Not very hard  Food Insecurity: No Food Insecurity (01/24/2022)   Hunger Vital Sign    Worried About Running Out of Food in the Last Year: Never true    Ran Out of Food in the Last Year: Never true  Transportation Needs: Unmet Transportation Needs (01/24/2022)   PRAPARE - Transportation    Lack of Transportation (Medical): Yes    Lack of Transportation (Non-Medical): Yes  Physical Activity: Insufficiently Active (10/25/2021)   Exercise Vital Sign    Days of Exercise per Week: 7 days    Minutes of Exercise per Session: 20 min  Stress: Stress Concern Present (01/24/2022)   [employer] [place]    Feeling of Stress : To some extent  Social Connections: Moderately Integrated (01/24/2022)   Social Connection and Isolation Panel [NHANES]    Frequency of Communication with Friends and Family: More than three times a week    Frequency of Social Gatherings with Friends and Family: Once a week    Attends Religious Services: More than 4 times per year    Active Member of [employer] or Organizations: Yes    Attends Music therapist: More than 4 times per year    Marital Status: Never married     Family History: The patient's family history includes Arthritis in her brother and father; Hypertension in her father and mother; Kidney cancer in her mother; Lung cancer in her father; Other in her mother; Rheum arthritis in her maternal uncle; Stroke in her mother. There is no history of Bladder Cancer.  ROS:   Please see the history of present illness.     All other systems reviewed and are negative.  EKGs/Labs/Other Studies Reviewed:    The following studies were reviewed today:  EKG:  EKG is  ordered today.  The ekg ordered today demonstrates EKG normal sinus rhythm,  Recent  Labs: 04/19/2022: ALT 19; Brain Natriuretic Peptide 7; BUN 20; Creat 0.71; Hemoglobin 12.3; Platelets 370; Potassium 4.7; Sodium 139; TSH 0.88  Recent Lipid Panel    Component Value Date/Time   CHOL 194 01/21/2021 1042   CHOL 292 (H) 02/22/2015 1016   TRIG 255 (H) 01/21/2021 1042   HDL 44 (L) 01/21/2021 1042   HDL 24 (L) 02/22/2015 1016   CHOLHDL 4.4 01/21/2021 1042   VLDL NOT CALC 09/20/2016 1141   LDLCALC 114 (H) 01/21/2021 1042     Risk Assessment/Calculations:             Physical Exam:    VS:  BP 138/68 (BP Location: Left Arm)   Pulse 80   Ht 5' (1.524 m)   Wt 186 lb 6.4 oz (84.6 kg)   LMP  (LMP Unknown)   SpO2 98%   BMI 36.40 kg/m     Wt Readings from Last 3 Encounters:  06/29/22 186 lb 6.4 oz (84.6 kg)  05/05/22 196 lb 3.2 oz (89 kg)  04/19/22 192 lb 8 oz (87.3 kg)     GEN:  Well nourished, well developed in no acute distress HEENT: Normal NECK: No JVD; No carotid bruits CARDIAC: RRR, no murmurs, rubs, gallops RESPIRATORY:  Clear to auscultation without rales, wheezing or rhonchi  ABDOMEN: Soft, non-tender, non-distended MUSCULOSKELETAL: 1+ right lower extremity edema, left BKA noted, prosthesis. SKIN: Warm and dry NEUROLOGIC:  Alert and oriented x 3 PSYCHIATRIC:  Normal affect   ASSESSMENT:    1. Leg edema   2. BMI 36.0-36.9,adult   3. Mixed hyperlipidemia   4. Pre-operative cardiovascular examination     PLAN:    In order of problems listed above:  Right leg edema, echo with normal EF 60 to 65%, impaired relaxation, edema likely from obesity.  Patient reassured Obesity, diabetic, history of diabetic complication/infection s/p left BKA.  Lost 10 pounds since last visit.  Continue Mounjaro, continue low calorie diet. Hyperlipidemia, continue Zetia, fenofibrate.  Weight loss will also help with patient's cholesterol. Preop evaluation, okay to proceed with back surgery from a cardiac perspective.  Echo with normal EF, patient denies symptoms of  chest pain or shortness of breath.  Follow-up in 6 months.      Medication Adjustments/Labs and Tests Ordered: Current medicines are reviewed at length with the patient today.  Concerns regarding medicines are outlined above.  No orders of the defined types were placed in this encounter.  No orders of the defined types were placed in this encounter.   Patient Instructions  Medication Instructions:   Your physician recommends that you continue on your current medications as directed. Please refer to the Current Medication list given to you today.  *If you need a refill on your cardiac medications before your next appointment, please call your pharmacy*   Lab Work:  None Ordered  If you have labs (blood work) drawn today and your tests are completely normal, you will receive your results only by: [place] (if you have MyChart) OR A paper copy in the mail If you have any lab test that is abnormal or we need to change your treatment, we will call you to review the results.   Testing/Procedures:  None Ordered   Follow-Up: At [facility], you and your health needs are our priority.  As part of our continuing mission to provide you with exceptional heart care, we have created designated Provider Care Teams.  These Care Teams include your primary Cardiologist (physician) and  Advanced Practice Providers (APPs -  Physician Assistants and Nurse Practitioners) who all work together to provide you with the care you need, when you need it.  We recommend signing up for the patient portal called "MyChart".  Sign up information is provided on this After Visit Summary.  MyChart is used to connect with patients for Virtual Visits (Telemedicine).  Patients are able to view lab/test results, encounter notes, upcoming appointments, etc.  Non-urgent messages can be sent to your provider as well.   To learn more about what you can do with MyChart, go to [email or web address].     Your next appointment:   6 month(s)  Provider:   You may see Kate Sable, MD or one of the following Advanced Practice Providers on your designated Care Team:   Murray Hodgkins, NP Christell Faith, PA-C Cadence Kathlen Mody, PA-C Gerrie Nordmann, NP   Signed, Kate Sable, MD  06/29/2022 12:19 PM    [place]

## 2022-06-29 NOTE — Patient Instructions (Signed)
Medication Instructions:   Your physician recommends that you continue on your current medications as directed. Please refer to the Current Medication list given to you today.  *If you need a refill on your cardiac medications before your next appointment, please call your pharmacy*   Lab Work:  None Ordered  If you have labs (blood work) drawn today and your tests are completely normal, you will receive your results only by: [place] (if you have MyChart) OR A paper copy in the mail If you have any lab test that is abnormal or we need to change your treatment, we will call you to review the results.   Testing/Procedures:  None Ordered   Follow-Up: At [facility], you and your health needs are our priority.  As part of our continuing mission to provide you with exceptional heart care, we have created designated Provider Care Teams.  These Care Teams include your primary Cardiologist (physician) and Advanced Practice Providers (APPs -  Physician Assistants and Nurse Practitioners) who all work together to provide you with the care you need, when you need it.  We recommend signing up for the patient portal called "MyChart".  Sign up information is provided on this After Visit Summary.  MyChart is used to connect with patients for Virtual Visits (Telemedicine).  Patients are able to view lab/test results, encounter notes, upcoming appointments, etc.  Non-urgent messages can be sent to your provider as well.   To learn more about what you can do with MyChart, go to [email or web address].    Your next appointment:   6 month(s)  Provider:   You may see Kate Sable, MD or one of the following Advanced Practice Providers on your designated Care Team:   Murray Hodgkins, NP Christell Faith, PA-C Cadence Kathlen Mody, PA-C Gerrie Nordmann, NP

## 2022-06-30 ENCOUNTER — Ambulatory Visit (INDEPENDENT_AMBULATORY_CARE_PROVIDER_SITE_OTHER): Payer: Medicare Other | Admitting: Family

## 2022-06-30 ENCOUNTER — Encounter: Payer: Self-pay | Admitting: Family

## 2022-06-30 DIAGNOSIS — L03116 Cellulitis of left lower limb: Secondary | ICD-10-CM

## 2022-06-30 DIAGNOSIS — E1165 Type 2 diabetes mellitus with hyperglycemia: Secondary | ICD-10-CM | POA: Diagnosis not present

## 2022-06-30 MED ORDER — DOXYCYCLINE HYCLATE 100 MG PO TABS: 100.0000 mg | ORAL_TABLET | Freq: Two times a day (BID) | ORAL | 0 refills | Status: DC

## 2022-06-30 NOTE — Progress Notes (Signed)
Office Visit Note   Patient: Olivia Romero           Date of Birth: 05-Jan-1956           MRN: [id number] Visit Date: 06/30/2022              Requested by: Delsa Grana, PA-C [address] 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  This has been ongoing for about 5 days now  She has been icing the area  Assessment & Plan: Visit Diagnoses: No diagnosis found.  Plan:  Do not feel I could drain the abscess here in the office.  Will place her on a course of antibiotics.  Will place her on a course of doxycycline plan to follow-up in 2 weeks.  Discussed that she is not seeing any improvement in the next 3 to 4 days she will call or return.  Follow-Up Instructions: No follow-ups on file.   Ortho Exam  Patient is alert, oriented, no adenopathy, well-dressed, normal affect, normal respiratory effort. On examination of the left residual limb her below-knee amputation is well consolidated well-healed medial to her mid tibia she has an area with mild erythema dry peeling skin there is a palpable mass beneath the skin measuring about 2 cm in diameter there is no palpable fluctuance Imaging: No results found. No images are attached to the encounter.  Labs: Lab Results  Component Value Date   HGBA1C 10.4 (H) 03/24/2022   HGBA1C 9.6 11/30/2021   HGBA1C 8.7 06/21/2021   ESRSEDRATE 60 (H) 12/05/2018   ESRSEDRATE 41 (H) 12/04/2018   ESRSEDRATE 30 07/12/2018   CRP 22.0 (H) 12/05/2018   CRP 20 (H) 12/04/2018   CRP 6 07/12/2018   LABURIC 5.4 04/22/2020   LABURIC 8.7 (H) 03/25/2020    LABURIC 8.2 (H) 11/30/2015   REPTSTATUS 12/12/2018 FINAL 12/06/2018   GRAMSTAIN  12/06/2018    RARE WBC PRESENT, PREDOMINANTLY PMN NO ORGANISMS SEEN    CULT  12/06/2018    RARE METHICILLIN RESISTANT STAPHYLOCOCCUS AUREUS WITH NORMAL SKIN FLORA NO ANAEROBES ISOLATED Performed at [facility], [place] [address] [place], [place] [address]    LABORGA METHICILLIN RESISTANT STAPHYLOCOCCUS AUREUS 12/06/2018     Lab Results  Component Value Date   ALBUMIN 3.7 12/04/2018   ALBUMIN 4.0 11/27/2018   ALBUMIN 3.8 03/11/2018   PREALBUMIN 21.5 03/12/2018    No results found for: "MG" Lab Results  Component Value Date   VD25OH 11 (L) 09/20/2016    Lab Results  Component Value Date   PREALBUMIN 21.5 03/12/2018      Latest Ref Rng & Units 04/19/2022   11:33 AM 03/24/2022   10:23 AM 12/23/2021    3:58 PM  CBC EXTENDED  WBC 3.8 - 10.8 Thousand/uL 8.6  8.6  9.3   RBC 3.80 - 5.10 Million/uL 4.65  4.79  4.89   Hemoglobin 11.7 - 15.5 g/dL 12.3  13.0  13.3   HCT 35.0 - 45.0 % 38.8  40.7  41.1   Platelets 140 -  400 Thousand/uL 370  318  336   NEUT# 1,500 - 7,800 cells/uL 5,143   5,357   Lymph# 850 - 3,900 cells/uL 2,434   2,920      There is no height or weight on file to calculate BMI.  Orders:  No orders of the defined types were placed in this encounter.  No orders of the defined types were placed in this encounter.    Procedures: No procedures performed  Clinical Data: No additional findings.  ROS:  All other systems negative, except as noted in the HPI. Review of Systems  Objective: Vital Signs: LMP  (LMP Unknown)   Specialty Comments:  No specialty comments available.  PMFS History: Patient Active Problem List   Diagnosis Date Noted   Microalbuminuria 04/18/2022   History of diastolic dysfunction 42/59/5638   Mitral valve insufficiency 04/18/2022   Statin myopathy 11/23/2021   Left lower quadrant abdominal tenderness without rebound tenderness  08/03/2021   History of colitis 08/03/2021   Change in bowel habits 08/03/2021   Generalized abdominal pain 08/03/2021   Anemia 01/21/2021   Urticaria 10/21/2020   Rash and nonspecific skin eruption 07/27/2020   Hepatotoxicity due to statin drug 07/15/2020   Vitamin D deficiency 07/12/2020   Chronic low back pain 07/12/2020   BMI 36.0-36.9,adult 12/29/2019   Sepsis without acute organ dysfunction ([place]) 12/29/2019   Post-operative state 12/29/2019   Asthma 12/29/2019   Cervical disc disorder 12/29/2019   Displacement of cervical intervertebral disc 12/29/2019   Abnormal posture 12/29/2019   Brachial radiculitis 12/29/2019   Neck pain 12/29/2019   Pain in upper limb 12/29/2019   Whiplash injury to neck 12/29/2019   Allergy to statin medication 12/01/2019   History of below knee amputation, left ([place]) 07/30/2019   Gangrene of left foot ([place]) 05/16/2019   Charcot's joint of foot, left    PAD (peripheral artery disease) ([place]) 12/05/2018   MRSA bacteremia 12/05/2018   Charcot's joint of foot due to diabetes ([place]) 09/18/2018   Class 2 severe obesity with serious comorbidity and body mass index (BMI) of 37.0 to 37.9 in adult ([place]) 11/19/2017   B12 deficiency 11/01/2017   Diabetic polyneuropathy associated with type 2 diabetes mellitus ([place]) 12/13/2015   Lumbar stenosis with neurogenic claudication 09/02/2015   Chronic constipation 07/13/2015   OSA (obstructive sleep apnea) 06/25/2015   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/23/2015   Hyperlipidemia 04/23/2015   Statin intolerance 04/23/2015   Asthma, mild intermittent 02/01/2015   Type 2 diabetes mellitus with diabetic neuropathy, with long-term current use of insulin ([place]) 12/31/2014   Gastroesophageal reflux disease with esophagitis 12/31/2014   Dysphagia 12/31/2014   Bilateral carpal tunnel syndrome 12/01/2014   Diverticulosis of colon 11/30/2012   Benign essential hypertension 11/28/2012   Past Medical History:   Diagnosis Date   Anemia    Arthritis    Asthma    Colon polyps    adenomatous   Diabetes mellitus without complication ([place])    Diabetic infection of left foot ([place]) 12/2018   Diverticulosis of colon    Esophagitis    GERD (gastroesophageal reflux disease)    Hemorrhoid    internal   Hyperlipemia    Hypertension    IBS (irritable bowel syndrome)    no current prob - diet controlled   Myalgia due to statin 11/19/2017   Neuropathy    Neuropathy of both feet    Neuropathy of hand    Pneumonia    x 4   PONV (  postoperative nausea and vomiting)    on some surgeries but not all procedures   Skin ulcer of right ankle, limited to breakdown of skin ([place]) 12/31/2016   resolved per patient 05/14/19   Sleep apnea    has had in the past lost 50 pounds and do longer uses cpap   Statin intolerance 04/23/2015   Stroke ([place]) 2009   mini stroke per patient   Stroke-like episode 2009   TIA - mini stroke per patient   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 07/13/2015   Uncontrolled type 2 diabetes mellitus with hyperglycemia ([place]) 11/01/2017    Family History  Problem Relation Age of Onset   Lung cancer Father    Hypertension Father    Arthritis Father    Other Mother        hardening of the arteries/renal cell carcenoma   Hypertension Mother    Stroke Mother    Kidney cancer Mother    Arthritis Brother    Rheum arthritis Maternal Uncle    Bladder Cancer Neg Hx     Past Surgical History:  Procedure Laterality Date   ABDOMINAL HYSTERECTOMY     AMPUTATION Left 12/06/2018   Procedure: PARTIAL AMPUTATION LEFT FOOT;  Surgeon: [place] Kins, DPM;  Location: [place];  Service: Podiatry;  Laterality: Left;   AMPUTATION Left 05/16/2019   Procedure: LEFT BELOW KNEE AMPUTATION;  Surgeon: Newt Minion, MD;  Location: [place];  Service: Orthopedics;  Laterality: Left;   APPENDECTOMY     removed in 11th grade   BACK SURGERY  09/02/2015   BONE BIOPSY Left 12/06/2018    Procedure: Bone Biopsy;  Surgeon: Edrick Kins, DPM;  Location: [place];  Service: Podiatry;  Laterality: Left;   [place]   COLONOSCOPY N/A 11/30/2012   Procedure: COLONOSCOPY;  Surgeon: Irene Shipper, MD;  Location[facility];  Service: Endoscopy;  Laterality: N/A;   COLONOSCOPY N/A 09/05/2021   Procedure: COLONOSCOPY;  Surgeon: Lesly Rubenstein, MD;  Location: [facility] ENDOSCOPY;  Service: Endoscopy;  Laterality: N/A;   COLONOSCOPY WITH PROPOFOL N/A 01/03/2016   Procedure: COLONOSCOPY WITH PROPOFOL;  Surgeon: Manya Silvas, MD;  Location: [facility] ENDOSCOPY;  Service: Endoscopy;  Laterality: N/A;   ESOPHAGOGASTRODUODENOSCOPY N/A 09/05/2021   Procedure: ESOPHAGOGASTRODUODENOSCOPY (EGD);  Surgeon: Lesly Rubenstein, MD;  Location: [facility] ENDOSCOPY;  Service: Endoscopy;  Laterality: N/A;   ESOPHAGOGASTRODUODENOSCOPY (EGD) WITH PROPOFOL N/A 01/14/2015   Procedure: ESOPHAGOGASTRODUODENOSCOPY (EGD) WITH PROPOFOL;  Surgeon: Manya Silvas, MD;  Location[facility] END[facility]COPY;  Service: Endoscopy;  Laterality: N/A;   IRRIGATION AND DEBRIDEMENT FOOT Left 03/13/2018   Procedure: IRRIGATION AND DEBRIDEMENT FOOT WITH BONE BIOPSY WITH MISONIX DEBRIDER;  Surgeon: Evelina Bucy, DPM;  Location: [place];  Service: Podiatry;  Laterality: Left;   IRRIGATION AND DEBRIDEMENT FOOT Left 12/06/2018   Procedure: Irrigation And Debridement Foot;  Surgeon: [place] Kins, DPM;  Location: [place];  Service: Podiatry;  Laterality: Left;   LUMBAR WOUND DEBRIDEMENT N/A 10/01/2015   Procedure: LUMBAR WOUND DEBRIDEMENT;  Surgeon: Ashok Pall, MD;  Location: [facility]S;  Service: Neurosurgery;  Laterality: N/A;  LUMBAR WOUND DEBRIDEMENT   NASAL SINUS SURGERY     SAVORY DILATION N/A 01/14/2015   Procedure: SAVORY DILATION;  Surgeon: Manya Silvas, MD;  Location: [facility] ENDOSCOPY;  Service: Endoscopy;  Laterality: N/A;   TONSILLECTOMY     WISDOM TOOTH EXTRACTION     Social History  Occupational History   Occupation: disabled  Tobacco Use   Smoking status: Never   Smokeless tobacco: Never  Vaping Use   Vaping Use: Never used  Substance and Sexual Activity   Alcohol use: Never   Drug use: No   Sexual activity: Not Currently    Comment: Hysterectomy

## 2022-07-05 DIAGNOSIS — Z89512 Acquired absence of left leg below knee: Secondary | ICD-10-CM | POA: Diagnosis not present

## 2022-07-05 DIAGNOSIS — G4733 Obstructive sleep apnea (adult) (pediatric): Secondary | ICD-10-CM | POA: Diagnosis not present

## 2022-07-05 DIAGNOSIS — G629 Polyneuropathy, unspecified: Secondary | ICD-10-CM | POA: Diagnosis not present

## 2022-07-14 ENCOUNTER — Ambulatory Visit: Payer: Medicare Other | Admitting: Podiatry

## 2022-07-14 ENCOUNTER — Ambulatory Visit (INDEPENDENT_AMBULATORY_CARE_PROVIDER_SITE_OTHER): Payer: Medicare Other | Admitting: Family

## 2022-07-14 DIAGNOSIS — L03116 Cellulitis of left lower limb: Secondary | ICD-10-CM

## 2022-07-19 ENCOUNTER — Encounter: Payer: Self-pay | Admitting: Family

## 2022-07-19 NOTE — Progress Notes (Signed)
Office Visit Note   Patient: Olivia Romero           Date of Birth: [birth year]           MRN: [id number] Visit Date: 07/14/2022              Requested by: Delsa Grana, PA-C [address] [place],  [place] [address] PCP: Delsa Grana, PA-C  Chief Complaint  Patient presents with   Left Leg - Follow-up      HPI: The patient is a 67 year old woman who presents in follow-up for cellulitis and abscess left residual limb.  She is pleased that this is much better much less pain and is much less swollen she is having some peeling skin and darkened discoloration.  She has been able to resume her prosthesis for minimal weightbearing   Assessment & Plan: Visit Diagnoses: No diagnosis found.  Plan:  She will complete her course of doxycycline.  Follow-up in 2 weeks if this continues.   Follow-Up Instructions: No follow-ups on file.   Ortho Exam  Patient is alert, oriented, no adenopathy, well-dressed, normal affect, normal respiratory effort. On examination of the left residual limb her below-knee amputation is well consolidated well-healed. medial to her mid tibia she has an area with mild erythema dry peeling skin.  Scant ecchymosis.  This now is less than a centimeter in diameter  there is no palpable fluctuance Imaging: No results found. No images are attached to the encounter.  Labs: Lab Results  Component Value Date   HGBA1C 10.4 (H) 03/24/2022   HGBA1C 9.6 11/30/2021   HGBA1C 8.7 06/21/2021   ESRSEDRATE 60 (H) 12/05/2018   ESRSEDRATE 41 (H) 12/04/2018   ESRSEDRATE 30 07/12/2018   CRP 22.0 (H) 12/05/2018   CRP 20 (H) 12/04/2018   CRP 6 07/12/2018   LABURIC 5.4 04/22/2020   LABURIC 8.7 (H) 03/25/2020   LABURIC 8.2 (H) 11/30/2015   REPTSTATUS 12/12/2018 FINAL 12/06/2018   GRAMSTAIN  12/06/2018    RARE WBC PRESENT, PREDOMINANTLY PMN NO ORGANISMS SEEN    CULT  12/06/2018    RARE METHICILLIN RESISTANT STAPHYLOCOCCUS AUREUS WITH NORMAL SKIN FLORA NO  ANAEROBES ISOLATED Performed at [facility], [place] [address], [place], [place] [address]    LABORGA METHICILLIN RESISTANT STAPHYLOCOCCUS AUREUS 12/06/2018     Lab Results  Component Value Date   ALBUMIN 3.7 12/04/2018   ALBUMIN 4.0 11/27/2018   ALBUMIN 3.8 03/11/2018   PREALBUMIN 21.5 03/12/2018    No results found for: "MG" Lab Results  Component Value Date   VD25OH 11 (L) 09/20/2016    Lab Results  Component Value Date   PREALBUMIN 21.5 03/12/2018      Latest Ref Rng & Units 04/19/2022   11:33 AM 03/24/2022   10:23 AM 12/23/2021    3:58 PM  CBC EXTENDED  WBC 3.8 - 10.8 Thousand/uL 8.6  8.6  9.3   RBC 3.80 - 5.10 Million/uL 4.65  4.79  4.89   Hemoglobin 11.7 - 15.5 g/dL 12.3  13.0  13.3   HCT 35.0 - 45.0 % 38.8  40.7  41.1   Platelets 140 - 400 Thousand/uL 370  318  336   NEUT# 1,500 - 7,800 cells/uL 5,143   5,357   Lymph# 850 - 3,900 cells/uL 2,434   2,920      There is no height or weight on file to calculate BMI.  Orders:  No orders of the defined types were placed in this  encounter.  No orders of the defined types were placed in this encounter.    Procedures: No procedures performed  Clinical Data: No additional findings.  ROS:  All other systems negative, except as noted in the HPI. Review of Systems  Objective: Vital Signs: LMP  (LMP Unknown)   Specialty Comments:  No specialty comments available.  PMFS History: Patient Active Problem List   Diagnosis Date Noted   Microalbuminuria 04/18/2022   History of diastolic dysfunction [id number]   Mitral valve insufficiency 04/18/2022   Statin myopathy 11/23/2021   Left lower quadrant abdominal tenderness without rebound tenderness 08/03/2021   History of colitis 08/03/2021   Change in bowel habits 08/03/2021   Generalized abdominal pain 08/03/2021   Anemia 01/21/2021   Urticaria 10/21/2020   Rash and nonspecific skin eruption 07/27/2020   Hepatotoxicity due to statin drug 07/15/2020    Vitamin D deficiency 07/12/2020   Chronic low back pain 07/12/2020   BMI 36.0-36.9,adult 12/29/2019   Sepsis without acute organ dysfunction ([place]) 12/29/2019   Post-operative state 12/29/2019   Asthma 12/29/2019   Cervical disc disorder 12/29/2019   Displacement of cervical intervertebral disc 12/29/2019   Abnormal posture 12/29/2019   Brachial radiculitis 12/29/2019   Neck pain 12/29/2019   Pain in upper limb 12/29/2019   Whiplash injury to neck 12/29/2019   Allergy to statin medication 12/01/2019   History of below knee amputation, left ([place]) 07/30/2019   Gangrene of left foot ([place]) 05/16/2019   Charcot's joint of foot, left    PAD (peripheral artery disease) ([place]) 12/05/2018   MRSA bacteremia 12/05/2018   Charcot's joint of foot due to diabetes ([place]) 09/18/2018   Class 2 severe obesity with serious comorbidity and body mass index (BMI) of 37.0 to 37.9 in adult ([place]) 11/19/2017   B12 deficiency 11/01/2017   Diabetic polyneuropathy associated with type 2 diabetes mellitus ([place]) 12/13/2015   Lumbar stenosis with neurogenic claudication 09/02/2015   Chronic constipation 07/13/2015   OSA (obstructive sleep apnea) 06/25/2015   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/23/2015   Hyperlipidemia 04/23/2015   Statin intolerance 04/23/2015   Asthma, mild intermittent 02/01/2015   Type 2 diabetes mellitus with diabetic neuropathy, with long-term current use of insulin ([place]) 12/31/2014   Gastroesophageal reflux disease with esophagitis 12/31/2014   Dysphagia 12/31/2014   Bilateral carpal tunnel syndrome 12/01/2014   Diverticulosis of colon 11/30/2012   Benign essential hypertension 11/28/2012   Past Medical History:  Diagnosis Date   Anemia    Arthritis    Asthma    Colon polyps    adenomatous   Diabetes mellitus without complication ([place])    Diabetic infection of left foot ([place]) 12/2018   Diverticulosis of colon    Esophagitis    GERD (gastroesophageal reflux  disease)    Hemorrhoid    internal   Hyperlipemia    Hypertension    IBS (irritable bowel syndrome)    no current prob - diet controlled   Myalgia due to statin 11/19/2017   Neuropathy    Neuropathy of both feet    Neuropathy of hand    Pneumonia    x 4   PONV (postoperative nausea and vomiting)    on some surgeries but not all procedures   Skin ulcer of right ankle, limited to breakdown of skin ([place]) 12/31/2016   resolved per patient 05/14/19   Sleep apnea    has had in the past lost 50 pounds and do longer uses cpap   Statin intolerance  04/23/2015   Stroke ([place]) 2009   mini stroke per patient   Stroke-like episode 2009   TIA - mini stroke per patient   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 07/13/2015   Uncontrolled type 2 diabetes mellitus with hyperglycemia ([place]) 11/01/2017    Family History  Problem Relation Age of Onset   Lung cancer Father    Hypertension Father    Arthritis Father    Other Mother        hardening of the arteries/renal cell carcenoma   Hypertension Mother    Stroke Mother    Kidney cancer Mother    Arthritis Brother    Rheum arthritis Maternal Uncle    Bladder Cancer Neg Hx     Past Surgical History:  Procedure Laterality Date   ABDOMINAL HYSTERECTOMY     AMPUTATION Left 12/06/2018   Procedure: PARTIAL AMPUTATION LEFT FOOT;  Surgeon: [place] Kins, DPM;  Location: [place];  Service: Podiatry;  Laterality: Left;   AMPUTATION Left 05/16/2019   Procedure: LEFT BELOW KNEE AMPUTATION;  Surgeon: Newt Minion, MD;  Location: [place];  Service: Orthopedics;  Laterality: Left;   APPENDECTOMY     removed in 11th grade   BACK SURGERY  09/02/2015   BONE BIOPSY Left 12/06/2018   Procedure: Bone Biopsy;  Surgeon: [place] Kins, DPM;  Location: [place];  Service: Podiatry;  Laterality: Left;   [place]   COLONOSCOPY N/A 11/30/2012   Procedure: COLONOSCOPY;  Surgeon: Irene Shipper, MD;  [facility];   Service: Endoscopy;  Laterality: N/A;   COLONOSCOPY N/A 09/05/2021   Procedure: COLONOSCOPY;  Surgeon: Lesly Rubenstein, MD;  Location: [facility] ENDOSCOPY;  Service: Endoscopy;  Laterality: N/A;   COLONOSCOPY WITH PROPOFOL N/A 01/03/2016   Procedure: COLONOSCOPY WITH PROPOFOL;  Surgeon: Manya Silvas, MD;  Location: [facility] ENDOSCOPY;  Service: Endoscopy;  Laterality: N/A;   ESOPHAGOGASTRODUODENOSCOPY N/A 09/05/2021   Procedure: ESOPHAGOGASTRODUODENOSCOPY (EGD);  Surgeon: Lesly Rubenstein, MD;  Location: [facility] ENDOSCOPY;  Service: Endoscopy;  Laterality: N/A;   ESOPHAGOGASTRODUODENOSCOPY (EGD) WITH PROPOFOL N/A 01/14/2015   Procedure: ESOPHAGOGASTRODUODENOSCOPY (EGD) WITH PROPOFOL;  Surgeon: Manya Silvas, MD;  Location: [facility] ENDOSCOPY;  Service: Endoscopy;  Laterality: N/A;   IRRIGATION AND DEBRIDEMENT FOOT Left 03/13/2018   Procedure: IRRIGATION AND DEBRIDEMENT FOOT WITH BONE BIOPSY WITH MISONIX DEBRIDER;  Surgeon: [place]vel[place] Bucy, DPM;  Location: [place];  Service: Podiatry;  Laterality: Left;   IRRIGATION AND DEBRIDEMENT FOOT Left 12/06/2018   Procedure: Irrigation And Debridement Foot;  Surgeon: [place] Kins, DPM;  Location: [place];  Service: Podiatry;  Laterality: Left;   LUMBAR WOUND DEBRIDEMENT N/A 10/01/2015   Procedure: LUMBAR WOUND DEBRIDEMENT;  Surgeon: Ashok Pall, MD;  Location: [place] [facility]UR[facility]S;  Service: Neurosurgery;  Laterality: N/A;  LUMBAR WOUND DEBRIDEMENT   NASAL SINUS SURGERY     SAVORY DILATION N/A 01/14/2015   Procedure: SAVORY DILATION;  Surgeon: Manya Silvas, MD;  Location: [facility] ENDOSCOPY;  Service: Endoscopy;  Laterality: N/A;   TONSILLECTOMY     WISDOM TOOTH EXTRACTION     Social History   Occupational History   Occupation: disabled  Tobacco Use   Smoking status: Never   Smokeless tobacco: Never  Vaping Use   Vaping Use: Never used  Substance and Sexual Activity   Alcohol use: Never   Drug use: No   Sexual activity: Not Currently    Comment:  Hysterectomy

## 2022-07-25 ENCOUNTER — Ambulatory Visit: Payer: Medicare Other | Admitting: Podiatry

## 2022-07-28 ENCOUNTER — Ambulatory Visit: Payer: Medicare Other | Admitting: Podiatry

## 2022-07-28 DIAGNOSIS — M79674 Pain in right toe(s): Secondary | ICD-10-CM | POA: Diagnosis not present

## 2022-07-28 DIAGNOSIS — B351 Tinea unguium: Secondary | ICD-10-CM | POA: Diagnosis not present

## 2022-07-28 DIAGNOSIS — M14671 Charcot's joint, right ankle and foot: Secondary | ICD-10-CM

## 2022-07-28 DIAGNOSIS — E1149 Type 2 diabetes mellitus with other diabetic neurological complication: Secondary | ICD-10-CM | POA: Diagnosis not present

## 2022-07-28 DIAGNOSIS — S88112A Complete traumatic amputation at level between knee and ankle, left lower leg, initial encounter: Secondary | ICD-10-CM

## 2022-07-28 NOTE — Progress Notes (Unsigned)
Subjective: Chief Complaint  Patient presents with   Follow-up    Right foot    Nail Problem    Routine Foot Care    DIABETIC SHOES     67 y.o. returns the office today for painful, elongated, thickened toenails which she cannot trim herself on the right foot.     PCP: Delsa Grana, PA-C  Objective: AAO 3, NAD DP/PT pulses palpable, CRT less than 3 seconds Left below-knee amputation Sensation absent with Semmes 1 to monofilament. Nails hypertrophic, dystrophic, elongated, brittle, discolored 5. There is tenderness overlying the nails 1-5 on the right. There is no surrounding erythema or drainage along the nail sites. Prominence of the fourth metatarsal head plantarly without any hyperkeratotic tissue.  There is no open lesions identified. 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 No pain to the foot/ankle today. Minimal edema, no erythema or warmth.  No pain with calf compression, swelling, warmth, erythema.  Assessment: Patient presents with symptomatic onychomycosis; diabetic foot exam, Charcot right foot  Plan: -Treatment options including alternatives, risks, complications were discussed -Nails sharply debrided 5 without complication/bleeding. -She was measured for diabetic shoes/inserts. The foot is reducible and we discussed various exercises to help with the ROM.  -Continue to monitor right foot daily.  Moisturizer daily. -Discussed daily foot inspection and glucose control. -Follow-up as scheduled or sooner if any problems are to arise. In the meantime, encouraged to call the office with any questions, concerns, changes symptoms.  Celesta Gentile, DPM  --- Patient presents to the office today for diabetic shoe and insole measuring.  Patient was measured with brannock device to determine size and width for 1 pair of extra depth shoes and foam casted for 3 pair of insoles.   ABN signed.   Documentation of medical necessity will be sent to patient's  treating diabetic doctor to verify and sign.   Patient's diabetic provider: Germain Osgood, MD     Shoes and insoles will be ordered at that time and patient will be notified for an appointment for fitting when they arrive.   Brannock measurement: 8   Patient shoe selection-   1st   Shoe choice:   Model 928 - [id number] NEW BALANCE  Shoe size ordered: 8 4E TO ACCOMMODATE AFO BRACE

## 2022-07-31 ENCOUNTER — Telehealth: Payer: Self-pay | Admitting: Family Medicine

## 2022-07-31 DIAGNOSIS — E1165 Type 2 diabetes mellitus with hyperglycemia: Secondary | ICD-10-CM | POA: Diagnosis not present

## 2022-07-31 NOTE — Telephone Encounter (Signed)
Copied from [place] [id number]. Topic: General - Other >> Jul 31, 2022 11:15 AM [place] B wrote: Reason for CRM: [email or web address]  from [facility] called in about if patient is taking statin and if not if you are willing for patient to try it? They also faxed this info on February 19, has this been received?

## 2022-07-31 NOTE — Telephone Encounter (Signed)
Called Olivia Romero back and no answer, unable to leave vm. Pt is taking zetia, we have not received any paperwork from them.

## 2022-08-08 ENCOUNTER — Telehealth: Payer: Self-pay | Admitting: Family Medicine

## 2022-08-08 NOTE — Telephone Encounter (Signed)
Contacted Olivia Romero to schedule their annual wellness visit. Appointment made for 10/27/2022.  [place] Direct Dial: [phone]

## 2022-08-09 ENCOUNTER — Telehealth: Payer: Self-pay | Admitting: Cardiology

## 2022-08-09 NOTE — Telephone Encounter (Signed)
Attempted to reach patient. No answer, left message to call back

## 2022-08-09 NOTE — Telephone Encounter (Signed)
Pt is returning call.  

## 2022-08-09 NOTE — Telephone Encounter (Signed)
Pt c/o medication issue:  1. Name of Medication: tirzepatide [facility]) 5 MG/0.5ML Pen   2. How are you currently taking this medication (dosage and times per day)? Inject once a week.   3. Are you having a reaction (difficulty breathing--STAT)? no   4. What is your medication issue? Patient is calling to check to see if she needs to go up to '7mg'$  now.   If so she needs a new script sent to   [place], [place]  Patient would like a return call.

## 2022-08-11 MED ORDER — MOUNJARO 15 MG/0.5ML ~~LOC~~ SOAJ: 15.0000 mg | SUBCUTANEOUS | 6 refills | Status: DC

## 2022-08-11 MED ORDER — MOUNJARO 12.5 MG/0.5ML ~~LOC~~ SOAJ: 12.5000 mg | SUBCUTANEOUS | 1 refills | Status: DC

## 2022-08-11 MED ORDER — MOUNJARO 10 MG/0.5ML ~~LOC~~ SOAJ: 10.0000 mg | SUBCUTANEOUS | 0 refills | Status: DC

## 2022-08-11 MED ORDER — MOUNJARO 7.5 MG/0.5ML ~~LOC~~ SOAJ: 7.5000 mg | SUBCUTANEOUS | 0 refills | Status: DC

## 2022-08-11 NOTE — Telephone Encounter (Signed)
Spoke with patient and she inquired about her Mounjaro dosing. She has been on 5 mg dose and is wanting to know if she should go ahead and increase. Advised that I will check with her pharmacy and then discuss with provider. She verbalized understanding with no further questions at this time.

## 2022-08-11 NOTE — Addendum Note (Signed)
Addended by: Valora Corporal on: 08/11/2022 03:07 PM   Modules accepted: Orders

## 2022-08-11 NOTE — Telephone Encounter (Signed)
Notified patient that orders have been placed and sent to her pharmacy. She was very Patent attorney. We discussed that if she should have any problems with them to give [place] a call. She verbalized understanding with no further questions at this time.

## 2022-08-14 ENCOUNTER — Telehealth: Payer: Self-pay | Admitting: Cardiology

## 2022-08-14 MED ORDER — MOUNJARO 5 MG/0.5ML ~~LOC~~ SOAJ: 5.0000 mg | SUBCUTANEOUS | 0 refills | Status: DC

## 2022-08-14 NOTE — Telephone Encounter (Signed)
Called patient with the okay to stay on '5MG'$  of mounjaro until her pharmacy has the 7.5 mg dose.

## 2022-08-14 NOTE — Telephone Encounter (Signed)
Pt c/o medication issue:  1. Name of Medication:   tirzepatide (MOUNJARO) 7.5 MG/0.5ML Pen   2. How are you currently taking this medication (dosage and times per day)?   As prescribed  3. Are you having a reaction (difficulty breathing--STAT)?   4. What is your medication issue?   Patient stated the 7.5 dosage of this medication is out of stock at her pharmacy until May.  Patient stated 5.0 dosage [place] in stock.  Patient wants to know if she should wait to get the higher dosage or can she take the lower dosage.

## 2022-08-15 ENCOUNTER — Telehealth: Payer: Self-pay | Admitting: Cardiology

## 2022-08-15 ENCOUNTER — Other Ambulatory Visit: Payer: Self-pay

## 2022-08-15 MED ORDER — MOUNJARO 7.5 MG/0.5ML ~~LOC~~ SOAJ: 7.5000 mg | SUBCUTANEOUS | 0 refills | Status: DC

## 2022-08-15 NOTE — Telephone Encounter (Signed)
tirzepatide [facility]) 7.5 MG/0.5ML Pen 2 mL 0 08/15/2022    Sig - Route: Inject 7.5 mg into the skin once a week. - Subcutaneous   Sent to pharmacy as: tirzepatide [facility]) 7.5 MG/0.5ML Pen   E-Prescribing Status: Receipt confirmed by pharmacy (08/15/2022 10:53 AM EDT)    Pharmacy  [id number] - [place], [place]

## 2022-08-15 NOTE — Telephone Encounter (Signed)
*  STAT* If patient is at the pharmacy, call can be transferred to refill team.   1. Which medications need to be refilled? (please list name of each medication and dose if known)  tirzepatide (MOUNJARO) 7.5 MG/0.5ML Pen  2. Which pharmacy/location (including street and city if local pharmacy) is medication to be sent to? [place], [place], [place] [address]  3. Do they need a 30 day or 90 day supply?  30 day supply  Patient is requesting to have her prescription transferred. She states [facility] and [facility] are completely out of stock.

## 2022-08-22 DIAGNOSIS — E1142 Type 2 diabetes mellitus with diabetic polyneuropathy: Secondary | ICD-10-CM | POA: Diagnosis not present

## 2022-08-29 ENCOUNTER — Telehealth: Payer: Self-pay | Admitting: Podiatry

## 2022-08-29 DIAGNOSIS — Z89512 Acquired absence of left leg below knee: Secondary | ICD-10-CM | POA: Diagnosis not present

## 2022-08-29 DIAGNOSIS — Z789 Other specified health status: Secondary | ICD-10-CM | POA: Diagnosis not present

## 2022-08-29 DIAGNOSIS — E1142 Type 2 diabetes mellitus with diabetic polyneuropathy: Secondary | ICD-10-CM | POA: Diagnosis not present

## 2022-08-29 DIAGNOSIS — E1129 Type 2 diabetes mellitus with other diabetic kidney complication: Secondary | ICD-10-CM | POA: Diagnosis not present

## 2022-08-29 DIAGNOSIS — E1169 Type 2 diabetes mellitus with other specified complication: Secondary | ICD-10-CM | POA: Diagnosis not present

## 2022-08-29 DIAGNOSIS — E113293 Type 2 diabetes mellitus with mild nonproliferative diabetic retinopathy without macular edema, bilateral: Secondary | ICD-10-CM | POA: Diagnosis not present

## 2022-08-29 DIAGNOSIS — E11610 Type 2 diabetes mellitus with diabetic neuropathic arthropathy: Secondary | ICD-10-CM | POA: Diagnosis not present

## 2022-08-29 DIAGNOSIS — Z794 Long term (current) use of insulin: Secondary | ICD-10-CM | POA: Diagnosis not present

## 2022-08-29 DIAGNOSIS — E781 Pure hyperglyceridemia: Secondary | ICD-10-CM | POA: Diagnosis not present

## 2022-08-29 DIAGNOSIS — R809 Proteinuria, unspecified: Secondary | ICD-10-CM | POA: Diagnosis not present

## 2022-08-29 DIAGNOSIS — E1165 Type 2 diabetes mellitus with hyperglycemia: Secondary | ICD-10-CM | POA: Diagnosis not present

## 2022-08-29 NOTE — Telephone Encounter (Signed)
Lmom for patient to call back and schedule picking up diabetic shoes.

## 2022-09-18 ENCOUNTER — Ambulatory Visit (INDEPENDENT_AMBULATORY_CARE_PROVIDER_SITE_OTHER): Payer: Medicare Other

## 2022-09-18 DIAGNOSIS — M14671 Charcot's joint, right ankle and foot: Secondary | ICD-10-CM

## 2022-09-18 DIAGNOSIS — E1149 Type 2 diabetes mellitus with other diabetic neurological complication: Secondary | ICD-10-CM

## 2022-09-18 NOTE — Progress Notes (Signed)
Patient presents today to pick up diabetic shoes and insoles.  She tried on the shoes with the insoles and the fit was not satisfactory as her brace would not fit in the shoe.  Reorder: NB 813  Insoles were kept by [facility]  to check fit with reordered shoes.  Patient will be contacted for a fitting appointment once the reordered shoes arrive in office.

## 2022-09-29 ENCOUNTER — Ambulatory Visit: Payer: Self-pay

## 2022-09-29 ENCOUNTER — Telehealth (INDEPENDENT_AMBULATORY_CARE_PROVIDER_SITE_OTHER): Payer: Medicare Other | Admitting: Family Medicine

## 2022-09-29 DIAGNOSIS — K5900 Constipation, unspecified: Secondary | ICD-10-CM

## 2022-09-29 DIAGNOSIS — R195 Other fecal abnormalities: Secondary | ICD-10-CM | POA: Diagnosis not present

## 2022-09-29 DIAGNOSIS — R600 Localized edema: Secondary | ICD-10-CM

## 2022-09-29 DIAGNOSIS — R140 Abdominal distension (gaseous): Secondary | ICD-10-CM

## 2022-09-29 DIAGNOSIS — E1165 Type 2 diabetes mellitus with hyperglycemia: Secondary | ICD-10-CM | POA: Diagnosis not present

## 2022-09-29 MED ORDER — MAGNESIUM CITRATE PO SOLN
0.5000 | Freq: Once | ORAL | 0 refills | Status: AC
Start: 2022-09-29 — End: 2022-09-29

## 2022-09-29 MED ORDER — FUROSEMIDE 20 MG PO TABS
10.0000 mg | ORAL_TABLET | Freq: Every day | ORAL | 0 refills | Status: DC | PRN
Start: 2022-09-29 — End: 2024-03-26

## 2022-09-29 NOTE — Telephone Encounter (Signed)
Pt is calling to severe constipation. No available appts. Pt is flexible to do a virtual appt today. Please advise   Chief Complaint: Constipation x 1 week, bloating. Decreased appetite.Has tried OTC. Symptoms: Above Frequency: This week Pertinent Negatives: Patient denies any bleeding. Disposition: [] ED /[] Urgent Care (no appt availability in office) / [x] Appointment(In office/virtual)/ []  [facility]/ [] Home Care/ [] Refused Recommended Disposition /[] [facility]/ []  Follow-up with PCP Additional Notes:   Reason for Disposition  Abdomen is more swollen than usual  Answer Assessment - Initial Assessment Questions 1. STOOL PATTERN OR FREQUENCY: "How often do you have a bowel movement (BM)?"  (Normal range: 3 times a day to every 3 days)  "When was your last BM?"       1 week ago 2. STRAINING: "Do you have to strain to have a BM?"      Yes 3. RECTAL PAIN: "Does your rectum hurt when the stool comes out?" If Yes, ask: "Do you have hemorrhoids? How bad is the pain?"  (Scale 1-10; or mild, moderate, severe)     Has hemorrhoids 4. STOOL COMPOSITION: "Are the stools hard?"      Hard 5. BLOOD ON STOOLS: "Has there been any blood on the toilet tissue or on the surface of the BM?" If Yes, ask: "When was the last time?"     No 6. CHRONIC CONSTIPATION: "Is this a new problem for you?"  If No, ask: "How long have you had this problem?" (days, weeks, months)      Yes 7. CHANGES IN DIET OR HYDRATION: "Have there been any recent changes in your diet?" "How much fluids are you drinking on a daily basis?"  "How much have you had to drink today?"     No 8. MEDICINES: "Have you been taking any new medicines?" "Are you taking any narcotic pain medicines?" (e.g., Dilaudid, morphine, Percocet, Vicodin)     No 9. LAXATIVES: "Have you been using any stool softeners, laxatives, or enemas?"  If Yes, ask "What, how often, and when was the last time?"     Yes, Dulcolax, Miralax,  suppository 10. ACTIVITY:  "How much walking do you do every day?"  "Has your activity level decreased in the past week?"        No 11. CAUSE: "What do you think is causing the constipation?"        Unsure 12. OTHER SYMPTOMS: "Do you have any other symptoms?" (e.g., abdomen pain, bloating, fever, vomiting)       Bloating 13. MEDICAL HISTORY: "Do you have a history of hemorrhoids, rectal fissures, or rectal surgery or rectal abscess?"         Yes 14. PREGNANCY: "Is there any chance you are pregnant?" "When was your last menstrual period?"       No  Protocols used: Constipation-A-AH

## 2022-09-29 NOTE — Addendum Note (Signed)
Addended by: Danelle Berry on: 09/29/2022 12:53 PM   Modules accepted: Orders

## 2022-09-29 NOTE — Patient Instructions (Addendum)
I would continue the miralax but increase the dose to 1.5 to 2 cap full twice a day - if its working it should help your stool hold more water and stool should be less firm/compact and even start to be stickier or looser.  If your stool becomes too watery then reduce the dose back to once a day (titrate for what you need).  You can back off on the stimulant laxative you're taking.  That can cause come cramping or pain.  Try a magnesium supplement - mag citrate can help bowels move.   I sent in Mag citrate but I've heard its currently hard to find or not available - you can also do OTC mag supplements pills/capsules/gummies (mag citrate or mag sulfate work best for constipation)  If you're able to find a bottle of liquid mag citrate I would definitely try that - drink 1/2 the bottle with a full glass of clear liquid (even clear low sugar liquid with electrolytes) and if not bowel movement in about 6 hours then you can take the remaining 1/2 bottle.    I recommend decreasing your lasix use - or even holding it for the next week and overall adjust it to only taking when you have severe swelling/fluid retention.  Get the xray done when you can - outpatient imaging on [address]  See the info below - overall basics for managing bowels include adequate fiber and fluid intake.  If you are not getting enough fiber in your diet then you can supplement with even metamucil or benefiber (but I would only add the fiber after you get this new constipation and back up resolved and cleared out)  I will put in the referral for you to follow up with GI since you sound like there is an underlying concern of something being obstructed or changed in your bowels     Constipation, Adult Constipation is when a person has fewer than three bowel movements in a week, has difficulty having a bowel movement, or has stools (feces) that are dry, hard, or larger than normal. Constipation may be caused by an underlying  condition. It may become worse with age if a person takes certain medicines and does not take in enough fluids. Follow these instructions at home: Eating and drinking  Eat foods that have a lot of fiber, such as beans, whole grains, and fresh fruits and vegetables. Limit foods that are low in fiber and high in fat and processed sugars, such as fried or sweet foods. These include french fries, hamburgers, cookies, candies, and soda. Drink enough fluid to keep your urine pale yellow. General instructions Exercise regularly or as told by your health care provider. Try to do 150 minutes of moderate exercise each week. Use the bathroom when you have the urge to go. Do not hold it in. Take over-the-counter and prescription medicines only as told by your health care provider. This includes any fiber supplements. During bowel movements: Practice deep breathing while relaxing the lower abdomen. Practice pelvic floor relaxation. Watch your condition for any changes. Let your health care provider know about them. Keep all follow-up visits as told by your health care provider. This is important. Contact a health care provider if: You have pain that gets worse. You have a fever. You do not have a bowel movement after 4 days. You vomit. You are not hungry or you lose weight. You are bleeding from the opening between the buttocks (anus). You have thin, pencil-like stools. Get help  right away if: You have a fever and your symptoms suddenly get worse. You leak stool or have blood in your stool. Your abdomen is bloated. You have severe pain in your abdomen. You feel dizzy or you faint. Summary Constipation is when a person has fewer than three bowel movements in a week, has difficulty having a bowel movement, or has stools (feces) that are dry, hard, or larger than normal. Eat foods that have a lot of fiber, such as beans, whole grains, and fresh fruits and vegetables. Drink enough fluid to keep your  urine pale yellow. Take over-the-counter and prescription medicines only as told by your health care provider. This includes any fiber supplements. This information is not intended to replace advice given to you by your health care provider. Make sure you discuss any questions you have with your health care provider. Document Revised: 04/05/2022 Document Reviewed: 04/05/2022 Elsevier Patient Education  2023 [employer].

## 2022-09-29 NOTE — Progress Notes (Signed)
Name: Olivia Romero   MRN: [id number]    DOB: 1955-12-19   Date:09/29/2022       Progress Note  Subjective:    Chief Complaint  Chief Complaint  Patient presents with   Constipation    Onset 1 1/2 week, has tried miralax, suppositories and laxatives OTC    I connected with  Londell Moh  on 09/29/22 at 11:20 AM EDT by a video enabled telemedicine application and verified that I am speaking with the correct person using two identifiers.  I discussed the limitations of evaluation and management by telemedicine and the availability of in person appointments. The patient expressed understanding and agreed to proceed. Staff also discussed with the patient that there may be a patient responsible charge related to this service. Patient Location: home Provider Location: [facility] clinic Additional Individuals present:   HPI Last BM this am hard firm balls/compact Different shape and different than her normal  Concerned something is blocking/obstructing her Tried miralax, several stool softeners/laxitives otc (dolculax - bisacodyl) and used glycerin suppositories  Miralax one capful once a day  Drinking a lot of water, urinating plenty On lasix - clear  Sugars 118  No abd pain, but feels bloated     Patient Active Problem List   Diagnosis Date Noted   Microalbuminuria 04/18/2022   History of diastolic dysfunction 04/18/2022   Mitral valve insufficiency 04/18/2022   Statin myopathy 11/23/2021   Left lower quadrant abdominal tenderness without rebound tenderness 08/03/2021   History of colitis 08/03/2021   Change in bowel habits 08/03/2021   Generalized abdominal pain 08/03/2021   Anemia 01/21/2021   Urticaria 10/21/2020   Rash and nonspecific skin eruption 07/27/2020   Hepatotoxicity due to statin drug 07/15/2020   Vitamin D deficiency 07/12/2020   Chronic low back pain 07/12/2020   BMI 36.0-36.9,adult 12/29/2019   Sepsis without acute organ dysfunction (HCC) 12/29/2019    Post-operative state 12/29/2019   Asthma 12/29/2019   Cervical disc disorder 12/29/2019   Displacement of cervical intervertebral disc 12/29/2019   Abnormal posture 12/29/2019   Brachial radiculitis 12/29/2019   Neck pain 12/29/2019   Pain in upper limb 12/29/2019   Whiplash injury to neck 12/29/2019   Allergy to statin medication 12/01/2019   History of below knee amputation, left (HCC) 07/30/2019   Gangrene of left foot (HCC) 05/16/2019   Charcot's joint of foot, left    PAD (peripheral artery disease) (HCC) 12/05/2018   MRSA bacteremia 12/05/2018   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) 09/18/2018   Class 2 severe obesity with serious comorbidity and body mass index (BMI) of 37.0 to 37.9 in adult (HCC) 11/19/2017   B12 deficiency 11/01/2017   Diabetic polyneuropathy associated with type 2 diabetes mellitus (HCC) 12/13/2015   Lumbar stenosis with neurogenic claudication 09/02/2015   Chronic constipation 07/13/2015   OSA (obstructive sleep apnea) 06/25/2015   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/23/2015   Hyperlipidemia 04/23/2015   Statin intolerance 04/23/2015   Asthma, mild intermittent 02/01/2015   Type 2 diabetes mellitus with diabetic neuropathy, with long-term current use of insulin (HCC) 12/31/2014   Gastroesophageal reflux disease with esophagitis 12/31/2014   Dysphagia 12/31/2014   Bilateral carpal tunnel syndrome 12/01/2014   Diverticulosis of colon 11/30/2012   Benign essential hypertension 11/28/2012    Social History   Tobacco Use   Smoking status: Never   Smokeless tobacco: Never  Substance Use Topics   Alcohol use: Never     Current  Outpatient Medications:    acetaminophen (TYLENOL) 500 MG tablet, Take 1,000 mg by mouth every 6 (six) hours as needed for mild pain. Maximum of 3,000 mg per day, Disp: , Rfl:    albuterol (VENTOLIN HFA) 108 (90 Base) MCG/ACT inhaler, INHALE 2 PUFFS INTO THE LUNGS EVERY 4 HOURS AS NEEDED, Disp: 6.7 g,  Rfl: 0   allopurinol (ZYLOPRIM) 100 MG tablet, Take 1 tablet (100 mg total) by mouth 2 (two) times daily., Disp: 60 tablet, Rfl: 3   aspirin EC 325 MG tablet, Take 1 tablet (325 mg total) by mouth daily., Disp: 30 tablet, Rfl: 0   Blood Glucose Monitoring Suppl (CONTOUR NEXT MONITOR) w/Device KIT, as directed., Disp: , Rfl:    Colchicine 0.6 MG CAPS, Take 0.6 mg by mouth 2 (two) times daily as needed., Disp: 60 capsule, Rfl: 1   doxycycline (VIBRA-TABS) 100 MG tablet, Take 1 tablet (100 mg total) by mouth 2 (two) times daily., Disp: 28 tablet, Rfl: 0   DULoxetine (CYMBALTA) 60 MG capsule, Take 120 mg by mouth at bedtime., Disp: , Rfl:    ezetimibe (ZETIA) 10 MG tablet, Take 1 tablet (10 mg total) by mouth daily., Disp: 90 tablet, Rfl: 1   fenofibrate (TRICOR) 145 MG tablet, Take 1 tablet (145 mg total) by mouth daily., Disp: 90 tablet, Rfl: 1   furosemide (LASIX) 20 MG tablet, Take 1 tablet (20 mg total) by mouth daily as needed for fluid or edema. Take in am with potassium PRN, Disp: 10 tablet, Rfl: 0   gabapentin (NEURONTIN) 100 MG capsule, Take 100 mg by mouth at bedtime., Disp: , Rfl:    gabapentin (NEURONTIN) 800 MG tablet, Take 800 mg by mouth 2 (two) times daily., Disp: , Rfl:    glimepiride (AMARYL) 2 MG tablet, Take 2 mg by mouth 2 (two) times daily. , Disp: , Rfl:    hydrochlorothiazide (HYDRODIURIL) 25 MG tablet, Take 1 tablet (25 mg total) by mouth daily., Disp: 90 tablet, Rfl: 0   Incontinence Supply Disposable (ASSURANCE FITTED BRIEF LARGE) MISC, Use 1 brief up to 6 times a day as needed for urinary incontinence, Disp: 180 each, Rfl: 5   insulin degludec (TRESIBA) 200 UNIT/ML FlexTouch Pen, Inject 20 Units into the skin at bedtime. , Disp: , Rfl:    mesalamine (LIALDA) 1.2 g EC tablet, Take 1.2 g by mouth daily., Disp: , Rfl:    metFORMIN (GLUCOPHAGE-XR) 500 MG 24 hr tablet, Take 500 mg by mouth 3 (three) times daily. , Disp: , Rfl:    Multiple Vitamins-Minerals (MULTIVITAMIN GUMMIES  WOMENS PO), Take 1 tablet by mouth daily., Disp: , Rfl:    pantoprazole (PROTONIX) 40 MG tablet, Take 1 tablet (40 mg total) by mouth daily as needed (Heartburn). (Patient taking differently: Take 40 mg by mouth daily.), Disp: 90 tablet, Rfl: 2   polyethylene glycol (MIRALAX / GLYCOLAX) 17 g packet, Take 17 g by mouth every other day., Disp: , Rfl:    potassium chloride SA (KLOR-CON M) 20 MEQ tablet, Take 1 tablet (20 mEq total) by mouth daily as needed (take on days when taking lasix/furosemide)., Disp: 20 tablet, Rfl: 0   tirzepatide (MOUNJARO) 10 MG/0.5ML Pen, Inject 10 mg into the skin once a week., Disp: 2 mL, Rfl: 0   ULTICARE SHORT PEN NEEDLES 31G X 8 MM MISC, , Disp: , Rfl:    [START ON 10/11/2022] tirzepatide (MOUNJARO) 12.5 MG/0.5ML Pen, Inject 12.5 mg into the skin once a week. (Patient not taking: Reported  on 09/29/2022), Disp: 2 mL, Rfl: 1   [START ON 11/11/2022] tirzepatide (MOUNJARO) 15 MG/0.5ML Pen, Inject 15 mg into the skin once a week. (Patient not taking: Reported on 09/29/2022), Disp: 2 mL, Rfl: 6   tirzepatide (MOUNJARO) 5 MG/0.5ML Pen, Inject 5 mg into the skin once a week. (Patient not taking: Reported on 09/29/2022), Disp: 6 mL, Rfl: 0   tirzepatide (MOUNJARO) 7.5 MG/0.5ML Pen, Inject 7.5 mg into the skin once a week. (Patient not taking: Reported on 09/29/2022), Disp: 2 mL, Rfl: 0  Allergies  Allergen Reactions   Egg-Derived Products Swelling and Other (See Comments)    Angioedema   Atorvastatin Other (See Comments)    Liver toxicity   Influenza Virus Vaccine Swelling    Arm swelled (site of injection)   Latex Itching   Pravastatin Itching and Rash   Tape Rash and Other (See Comments)    Adhesive on foot pull off skin.  Ok to use paper tape.    I personally reviewed active problem list, medication list, allergies, family history, social history, health maintenance, notes from last encounter, lab results, imaging with the patient/caregiver today.   Review of Systems   Constitutional: Negative.   HENT: Negative.    Eyes: Negative.   Respiratory: Negative.    Cardiovascular: Negative.   Gastrointestinal: Negative.   Endocrine: Negative.   Genitourinary: Negative.   Musculoskeletal: Negative.   Skin: Negative.   Allergic/Immunologic: Negative.   Neurological: Negative.   Hematological: Negative.   Psychiatric/Behavioral: Negative.    All other systems reviewed and are negative.     Objective:   Virtual encounter, vitals limited, only able to obtain the following There were no vitals filed for this visit. There is no height or weight on file to calculate BMI. Nursing Note and Vital Signs reviewed.  Physical Exam Vitals and nursing note reviewed.  Constitutional:      Appearance: She is obese.  Pulmonary:     Effort: No respiratory distress.  Neurological:     Mental Status: She is alert.     PE limited by virtual encounter  No results found. However, due to the size of the patient record, not all encounters were searched. Please check Results Review for a complete set of results.  Assessment and Plan:   1. Constipation, unspecified constipation type Extensive instructions on AVS -  Including adjusting/titrating miralax dose up Reducing use of lasix to only rarely for severe LE edema (currently doing every other day) Trial of mag citrate if able to get  Maintenance with increased fiber and fluid intake, titrating miralax to keep soft stools, can try mag sulfate supplement daily for bowels f/up GI  - DG Abd 1 View; Future - magnesium citrate SOLN; Take 148 mLs (0.5 Bottles total) by mouth once for 1 dose. Drink at least 10 oz clear liquid after dose.  If no bowel movement in 6 hours then repeat dose with remaining half of bottle  Dispense: 296 mL; Refill: 0 - Ambulatory referral to Gastroenterology  2. Abdominal bloating Screening Xray to assess stool burden - DG Abd 1 View; Future - Ambulatory referral to  Gastroenterology  3. Change in stool She's concerned with change to baseline - caliber change, like something is obstructing - f/up GI - Ambulatory referral to Gastroenterology  4. Lower extremity edema Take lasix only prn not every other day - furosemide (LASIX) 20 MG tablet; Take 0.5-1 tablets (10-20 mg total) by mouth daily as needed. For severe fluid retention in  lower extremity.  Take in am with potassium PRN  Dispense: 10 tablet; Refill: 0  She is passing stool, even some today but very compact ER would be needed with no BM over a week or severe sudden pain with no ability to pass gas or stool     -Red flags and when to present for emergency care or RTC including fever >101.32F, chest pain, shortness of breath, new/worsening/un-resolving symptoms, reviewed with patient at time of visit. Follow up and care instructions discussed and provided in AVS. - I discussed the assessment and treatment plan with the patient. The patient was provided an opportunity to ask questions and all were answered. The patient agreed with the plan and demonstrated an understanding of the instructions.  I provided 25+ minutes of non-face-to-face time during this encounter.  Danelle Berry, PA-C 09/29/22 11:45 AM

## 2022-10-02 ENCOUNTER — Ambulatory Visit
Admission: RE | Admit: 2022-10-02 | Discharge: 2022-10-02 | Disposition: A | Discharge status: Home / Self Care | Payer: Medicare Other | Attending: Family Medicine | Admitting: Family Medicine

## 2022-10-02 ENCOUNTER — Ambulatory Visit
Admission: RE | Admit: 2022-10-02 | Discharge: 2022-10-02 | Disposition: A | Discharge status: Home / Self Care | Payer: Medicare Other | Source: Ambulatory Visit | Attending: Family Medicine | Admitting: Family Medicine

## 2022-10-02 DIAGNOSIS — R140 Abdominal distension (gaseous): Secondary | ICD-10-CM | POA: Insufficient documentation

## 2022-10-02 DIAGNOSIS — K5900 Constipation, unspecified: Secondary | ICD-10-CM | POA: Insufficient documentation

## 2022-10-05 ENCOUNTER — Other Ambulatory Visit: Payer: Medicare Other

## 2022-10-19 ENCOUNTER — Telehealth: Payer: Self-pay | Admitting: Cardiology

## 2022-10-19 NOTE — Telephone Encounter (Signed)
Would you like to try anything else?

## 2022-10-19 NOTE — Telephone Encounter (Signed)
Pt c/o medication issue:  1. Name of Medication: Mounjaro  2. How are you currently taking this medication (dosage and times per day)? 1 time a week   3. Are you having a reaction (difficulty breathing--STAT)?   4. What is your medication issue? Medicine is really working, it is so expensive, she can not afford it. Could he prescribe something else please?

## 2022-10-20 ENCOUNTER — Other Ambulatory Visit: Payer: Self-pay

## 2022-10-20 ENCOUNTER — Telehealth: Payer: Self-pay | Admitting: Cardiology

## 2022-10-20 MED ORDER — SEMAGLUTIDE(0.25 OR 0.5MG/DOS) 2 MG/3ML ~~LOC~~ SOPN
0.2500 mg | PEN_INJECTOR | SUBCUTANEOUS | 0 refills | Status: DC
  Filled 2022-10-20: qty 3, 28d supply, fill #0
  Filled 2022-10-20: qty 3, 56d supply, fill #0

## 2022-10-20 NOTE — Telephone Encounter (Signed)
Patient called back. Patient was informed that the MD is recommending Ozempic since her insurance would not cover her Mounjaro. The patient said that she had heard negative things about Ozempic and was not sure if she wanted to try that. Patient states that she is going to call her insurance company and see why it will not cover the [facility].  The patient said that she will call us back after speaking to her insurance company.

## 2022-10-20 NOTE — Addendum Note (Signed)
Addended by: Margrett Rud on: 10/20/2022 10:57 AM   Modules accepted: Orders

## 2022-10-20 NOTE — Telephone Encounter (Addendum)
Called patient.  No answer. Left detailed message on VM - okay per DPR  Please prescribe Ozempic to see if insurance approves, patient is a diabetic.  Ozempic will replace Mounjaro.  Thank you  Went ahead and sent in the ozempic to see if insurance will approve.

## 2022-10-20 NOTE — Telephone Encounter (Signed)
Patient is returning CMA's call stating she did not get the VM.  Please advise.

## 2022-10-27 ENCOUNTER — Ambulatory Visit (INDEPENDENT_AMBULATORY_CARE_PROVIDER_SITE_OTHER): Payer: Medicare Other

## 2022-10-27 VITALS — Wt 186.0 lb

## 2022-10-27 DIAGNOSIS — Z0000 Encounter for general adult medical examination without abnormal findings: Secondary | ICD-10-CM

## 2022-10-27 NOTE — Progress Notes (Signed)
Subjective:   Olivia Romero is a 67 y.o. female who presents for Medicare Annual (Subsequent) preventive examination.  Review of Systems    I connected with  Londell Moh on 10/27/22 by a audio enabled telemedicine application and verified that I am speaking with the correct person using two identifiers.  Patient Location: Home  Provider Location: Home Office  I discussed the limitations of evaluation and management by telemedicine. The patient expressed understanding and agreed to proceed.  Cardiac Risk Factors include: advanced age (>54men, >46 women);diabetes mellitus     Objective:    Today's Vitals   10/27/22 0912  Weight: 186 lb (84.4 kg)   Body mass index is 36.33 kg/m.     10/27/2022    9:21 AM 03/24/2022   10:14 AM 10/25/2021   11:17 AM 09/05/2021    9:18 AM 08/24/2020    9:11 AM 09/22/2019    8:03 AM 08/21/2019    8:55 AM  Advanced Directives  Does Patient Have a Medical Advance Directive? Yes Yes No No No No No  Type of Estate agent of [place];Living will Healthcare Power of [place];Living will       Does patient want to make changes to medical advance directive?  No - Patient declined   Yes (MAU/Ambulatory/Procedural Areas - Information given)    Copy of Healthcare Power of Attorney in Chart? No - copy requested No - copy requested       Would patient like information on creating a medical advance directive?   No - Patient declined No - Patient declined  No - Patient declined Yes (MAU/Ambulatory/Procedural Areas - Information given)    Current Medications (verified) Outpatient Encounter Medications as of 10/27/2022  Medication Sig   acetaminophen (TYLENOL) 500 MG tablet Take 1,000 mg by mouth every 6 (six) hours as needed for mild pain. Maximum of 3,000 mg per day   albuterol (VENTOLIN HFA) 108 (90 Base) MCG/ACT inhaler INHALE 2 PUFFS INTO THE LUNGS EVERY 4 HOURS AS NEEDED   allopurinol (ZYLOPRIM) 100 MG tablet Take 1 tablet (100  mg total) by mouth 2 (two) times daily.   aspirin EC 325 MG tablet Take 1 tablet (325 mg total) by mouth daily.   Blood Glucose Monitoring Suppl (CONTOUR NEXT MONITOR) w/Device KIT as directed.   Colchicine 0.6 MG CAPS Take 0.6 mg by mouth 2 (two) times daily as needed.   DULoxetine (CYMBALTA) 60 MG capsule Take 120 mg by mouth at bedtime.   ezetimibe (ZETIA) 10 MG tablet Take 1 tablet (10 mg total) by mouth daily.   fenofibrate (TRICOR) 145 MG tablet Take 1 tablet (145 mg total) by mouth daily.   furosemide (LASIX) 20 MG tablet Take 0.5-1 tablets (10-20 mg total) by mouth daily as needed. For severe fluid retention in lower extremity.  Take in am with potassium PRN   gabapentin (NEURONTIN) 100 MG capsule Take 100 mg by mouth at bedtime.   gabapentin (NEURONTIN) 800 MG tablet Take 800 mg by mouth 2 (two) times daily.   glimepiride (AMARYL) 2 MG tablet Take 2 mg by mouth 2 (two) times daily.    hydrochlorothiazide (HYDRODIURIL) 25 MG tablet Take 1 tablet (25 mg total) by mouth daily.   Incontinence Supply Disposable (ASSURANCE FITTED BRIEF LARGE) MISC Use 1 brief up to 6 times a day as needed for urinary incontinence   insulin degludec (TRESIBA) 200 UNIT/ML FlexTouch Pen Inject 20 Units into the skin at bedtime.    mesalamine (LIALDA)  1.2 g EC tablet Take 1.2 g by mouth daily.   metFORMIN (GLUCOPHAGE-XR) 500 MG 24 hr tablet Take 500 mg by mouth 3 (three) times daily.    Multiple Vitamins-Minerals (MULTIVITAMIN GUMMIES WOMENS PO) Take 1 tablet by mouth daily.   pantoprazole (PROTONIX) 40 MG tablet Take 1 tablet (40 mg total) by mouth daily as needed (Heartburn). (Patient taking differently: Take 40 mg by mouth daily.)   polyethylene glycol (MIRALAX / GLYCOLAX) 17 g packet Take 17 g by mouth every other day.   potassium chloride SA (KLOR-CON M) 20 MEQ tablet Take 1 tablet (20 mEq total) by mouth daily as needed (take on days when taking lasix/furosemide).   Semaglutide,0.25 or 0.5MG /DOS, 2 MG/3ML  SOPN Inject 0.25 mg into the skin once a week. For 4 weeks   ULTICARE SHORT PEN NEEDLES 31G X 8 MM MISC    [DISCONTINUED] doxycycline (VIBRA-TABS) 100 MG tablet Take 1 tablet (100 mg total) by mouth 2 (two) times daily.   No facility-administered encounter medications on file as of 10/27/2022.    Allergies (verified) Egg-derived products, Atorvastatin, Influenza virus vaccine, Latex, Pravastatin, and Tape   History: Past Medical History:  Diagnosis Date   Anemia    Arthritis    Asthma    Colon polyps    adenomatous   Diabetes mellitus without complication (HCC)    Diabetic infection of left foot (HCC) 12/2018   Diverticulosis of colon    Esophagitis    GERD (gastroesophageal reflux disease)    Hemorrhoid    internal   Hyperlipemia    Hypertension    IBS (irritable bowel syndrome)    no current prob - diet controlled   Myalgia due to statin 11/19/2017   Neuropathy    Neuropathy of both feet    Neuropathy of hand    Pneumonia    x 4   PONV (postoperative nausea and vomiting)    on some surgeries but not all procedures   Skin ulcer of right ankle, limited to breakdown of skin (HCC) 12/31/2016   resolved per patient 05/14/19   Sleep apnea    has had in the past lost 50 pounds and do longer uses cpap   Statin intolerance 04/23/2015   Stroke (HCC) 2009   mini stroke per patient   Stroke-like episode 2009   TIA - mini stroke per patient   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 07/13/2015   Uncontrolled type 2 diabetes mellitus with hyperglycemia (HCC) 11/01/2017   Past Surgical History:  Procedure Laterality Date   ABDOMINAL HYSTERECTOMY     AMPUTATION Left 12/06/2018   Procedure: PARTIAL AMPUTATION LEFT FOOT;  Surgeon: Felecia Shelling, DPM;  Location: [facility] OR;  Service: Podiatry;  Laterality: Left;   AMPUTATION Left 05/16/2019   Procedure: LEFT BELOW KNEE AMPUTATION;  Surgeon: Nadara Mustard, MD;  Location: [facility] OR;  Service: Orthopedics;  Laterality: Left;    APPENDECTOMY     removed in 11th grade   BACK SURGERY  09/02/2015   BONE BIOPSY Left 12/06/2018   Procedure: Bone Biopsy;  Surgeon: Felecia Shelling, DPM;  Location: [facility] OR;  Service: Podiatry;  Laterality: Left;   CERVICAL FUSION     CESAREAN SECTION  1986   COLONOSCOPY N/A 11/30/2012   Procedure: COLONOSCOPY;  Surgeon: Hilarie Fredrickson, MD;  Location: [facility] ENDOSCOPY;  Service: Endoscopy;  Laterality: N/A;   COLONOSCOPY N/A 09/05/2021   Procedure: COLONOSCOPY;  Surgeon: Regis Bill, MD;  Location: [facility] ENDOSCOPY;  Service: Endoscopy;  Laterality: N/A;   COLONOSCOPY WITH PROPOFOL N/A 01/03/2016   Procedure: COLONOSCOPY WITH PROPOFOL;  Surgeon: Scot Jun, MD;  Location: [facility] ENDOSCOPY;  Service: Endoscopy;  Laterality: N/A;   ESOPHAGOGASTRODUODENOSCOPY N/A 09/05/2021   Procedure: ESOPHAGOGASTRODUODENOSCOPY (EGD);  Surgeon: Regis Bill, MD;  Location: [facility] ENDOSCOPY;  Service: Endoscopy;  Laterality: N/A;   ESOPHAGOGASTRODUODENOSCOPY (EGD) WITH PROPOFOL N/A 01/14/2015   Procedure: ESOPHAGOGASTRODUODENOSCOPY (EGD) WITH PROPOFOL;  Surgeon: Scot Jun, MD;  Location: [facility] ENDOSCOPY;  Service: Endoscopy;  Laterality: N/A;   IRRIGATION AND DEBRIDEMENT FOOT Left 03/13/2018   Procedure: IRRIGATION AND DEBRIDEMENT FOOT WITH BONE BIOPSY WITH MISONIX DEBRIDER;  Surgeon: Park Liter, DPM;  Location: [facility] OR;  Service: Podiatry;  Laterality: Left;   IRRIGATION AND DEBRIDEMENT FOOT Left 12/06/2018   Procedure: Irrigation And Debridement Foot;  Surgeon: Felecia Shelling, DPM;  Location: [facility] OR;  Service: Podiatry;  Laterality: Left;   LUMBAR WOUND DEBRIDEMENT N/A 10/01/2015   Procedure: LUMBAR WOUND DEBRIDEMENT;  Surgeon: Coletta Memos, MD;  Location: [facility] NEURO ORS;  Service: Neurosurgery;  Laterality: N/A;  LUMBAR WOUND DEBRIDEMENT   NASAL SINUS SURGERY     SAVORY DILATION N/A 01/14/2015   Procedure: SAVORY DILATION;  Surgeon: Scot Jun, MD;  Location: [facility] ENDOSCOPY;  Service:  Endoscopy;  Laterality: N/A;   TONSILLECTOMY     WISDOM TOOTH EXTRACTION     Family History  Problem Relation Age of Onset   Lung cancer Father    Hypertension Father    Arthritis Father    Other Mother        hardening of the arteries/renal cell carcenoma   Hypertension Mother    Stroke Mother    Kidney cancer Mother    Arthritis Brother    Rheum arthritis Maternal Uncle    Bladder Cancer Neg Hx    Social History   Socioeconomic History   Marital status: Single    Spouse name: Not on file   Number of children: 1   Years of education: Not on file   Highest education level: Not on file  Occupational History   Occupation: disabled  Tobacco Use   Smoking status: Never   Smokeless tobacco: Never  Vaping Use   Vaping Use: Never used  Substance and Sexual Activity   Alcohol use: Never   Drug use: No   Sexual activity: Not Currently    Comment: Hysterectomy  Other Topics Concern   Not on file  Social History Narrative   Not on file   Social Determinants of Health   Financial Resource Strain: Low Risk  (10/27/2022)   Overall Financial Resource Strain (CARDIA)    Difficulty of Paying Living Expenses: Not hard at all  Food Insecurity: No Food Insecurity (10/27/2022)   Hunger Vital Sign    Worried About Running Out of Food in the Last Year: Never true    Ran Out of Food in the Last Year: Never true  Transportation Needs: No Transportation Needs (10/27/2022)   PRAPARE - Administrator, Civil Service (Medical): No    Lack of Transportation (Non-Medical): No  Physical Activity: Sufficiently Active (10/27/2022)   Exercise Vital Sign    Days of Exercise per Week: 7 days    Minutes of Exercise per Session: 30 min  Stress: No Stress Concern Present (10/27/2022)   [employer] of Occupational Health - Occupational Stress Questionnaire    Feeling of Stress : Not at all  Social Connections: Socially Integrated (10/27/2022)  Social Advertising account executive  [NHANES]    Frequency of Communication with Friends and Family: More than three times a week    Frequency of Social Gatherings with Friends and Family: More than three times a week    Attends Religious Services: More than 4 times per year    Active Member of [employer] or Organizations: Yes    Attends Engineer, structural: More than 4 times per year    Marital Status: Married    Tobacco Counseling Counseling given: Yes   Clinical Intake:  Pre-visit preparation completed: Yes  Pain : No/denies pain     BMI - recorded: 36.33 Nutritional Status: BMI > 30  Obese Nutritional Risks: None Diabetes: Yes CBG done?: No Did pt. bring in CBG monitor from home?: No  How often do you need to have someone help you when you read instructions, pamphlets, or other written materials from your doctor or pharmacy?: 1 - Never  Diabetic?YES  Interpreter Needed?: No  Information entered by :: Fredirick Maudlin   Activities of Daily Living    10/27/2022    9:23 AM 09/29/2022   11:10 AM  In your present state of health, do you have any difficulty performing the following activities:  Hearing? 0 0  Vision? 1 0  Difficulty concentrating or making decisions? 1 0  Comment rememdering   Walking or climbing stairs? 1 0  Comment climbing stairs   Dressing or bathing? 0 0  Doing errands, shopping? 0 0  Preparing Food and eating ? N   Using the Toilet? N   In the past six months, have you accidently leaked urine? N   Do you have problems with loss of bowel control? N   Managing your Medications? N   Managing your Finances? N   Housekeeping or managing your Housekeeping? N     Patient Care Team: Danelle Berry, PA-C as PCP - General (Family Medicine) Debbe Odea, MD as PCP - Cardiology (Cardiology) Tedd Sias Marlana Salvage, MD as Physician Assistant (Endocrinology) Lonell Face, MD as Consulting Physician (Neurology) Vivi Barrack, DPM as Consulting Physician (Podiatry) Nadara Mustard, MD as Consulting Physician (Orthopedic Surgery) Gaspar Cola, [facility] (Pharmacist)  Indicate any recent Medical Services you may have received from other than [facility] providers in the past year (date may be approximate).     Assessment:   This is a routine wellness examination for [facility].  Hearing/Vision screen Hearing Screening - Comments:: Denies hearing difficulties   Vision Screening - Comments:: Wears rx glasses - up to date with routine eye exams with  [facility]  Dietary issues and exercise activities discussed: Current Exercise Habits: Home exercise routine, Type of exercise: walking, Time (Minutes): 35, Frequency (Times/Week): 6, Weekly Exercise (Minutes/Week): 210, Intensity: Mild, Exercise limited by: orthopedic condition(s)   Goals Addressed             This Visit's Progress    Patient Stated   On track    Patient currently in physical therapy to be able to walk again with prosthesis.       Depression Screen    10/27/2022    9:17 AM 09/29/2022   11:10 AM 04/19/2022   10:53 AM 01/24/2022    9:26 AM 12/23/2021    3:45 PM 10/25/2021   11:15 AM 07/25/2021   10:54 AM  PHQ 2/9 Scores  PHQ - 2 Score 0 0 0 0 0 0 0  PHQ- 9 Score 0 0 0 0  0    Fall Risk    10/27/2022    9:22 AM 09/29/2022   11:10 AM 04/19/2022   10:53 AM 01/24/2022    9:26 AM 12/23/2021    3:45 PM  Fall Risk   Falls in the past year? 0 0 1 1 1   Number falls in past yr: 0 0 0 1 1  Injury with Fall? 0 0 0 1 0  Risk for fall due to : No Fall Risks No Fall Risks Impaired balance/gait Impaired balance/gait;History of fall(s);Impaired mobility History of fall(s)  Follow up Falls prevention discussed;Falls evaluation completed Falls prevention discussed;Education provided;Falls evaluation completed Falls prevention discussed;Education provided;Falls evaluation completed  Falls evaluation completed    FALL RISK PREVENTION PERTAINING TO THE HOME:  Any stairs in or around the home? No   If so, are there any without handrails? No  Home free of loose throw rugs in walkways, pet beds, electrical cords, etc? No  Adequate lighting in your home to reduce risk of falls? Yes   ASSISTIVE DEVICES UTILIZED TO PREVENT FALLS:  Life alert? No  Use of a cane, walker or w/c? No  Grab bars in the bathroom? No  Shower chair or bench in shower? Yes  Elevated toilet seat or a handicapped toilet? No   TIMED UP AND GO:  Was the test performed?  No televisit  .   Cognitive Function:        10/27/2022    9:17 AM 01/31/2017    3:10 PM  6CIT Screen  What Year? 0 points 0 points  What month? 0 points 0 points  What time? 0 points 0 points  Count back from 20 0 points 0 points  Months in reverse 0 points 0 points  Repeat phrase 0 points 8 points  Total Score 0 points 8 points    Immunizations Immunization History  Administered Date(s) Administered   Influenza-Unspecified 06/24/2015   PNEUMOCOCCAL CONJUGATE-20 01/21/2021   Pneumococcal Polysaccharide-23 01/20/2014   Tdap 07/13/2015    TDAP status: Up to date  Flu Vaccine status: Declined, Education has been provided regarding the importance of this vaccine but patient still declined. Advised may receive this vaccine at local pharmacy or Health Dept. Aware to provide a copy of the vaccination record if obtained from local pharmacy or Health Dept. Verbalized acceptance and understanding.  Pneumococcal vaccine status: Up to date  Covid-19 vaccine status: Completed vaccines  Qualifies for Shingles Vaccine? Yes   Zostavax completed No   Shingrix Completed?: No.    Education has been provided regarding the importance of this vaccine. Patient has been advised to call insurance company to determine out of pocket expense if they have not yet received this vaccine. Advised may also receive vaccine at local pharmacy or Health Dept. Verbalized acceptance and understanding.  Screening Tests Health Maintenance  Topic Date Due   Zoster  Vaccines- Shingrix (1 of 2) Never done   FOOT EXAM  06/07/2022   HEMOGLOBIN A1C  09/23/2022   MAMMOGRAM  11/04/2022   OPHTHALMOLOGY EXAM  02/25/2023   Diabetic kidney evaluation - eGFR measurement  04/20/2023   Diabetic kidney evaluation - Urine ACR  04/20/2023   Medicare Annual Wellness (AWV)  10/27/2023   DTaP/Tdap/Td (2 - Td or Tdap) 07/12/2025   Colonoscopy  09/08/2026   Hepatitis C Screening  Completed   HIV Screening  Completed   HPV VACCINES  Aged Out   INFLUENZA VACCINE  Discontinued   PAP SMEAR-Modifier  Discontinued   COVID-19  Vaccine  Discontinued    Health Maintenance  Health Maintenance Due  Topic Date Due   Zoster Vaccines- Shingrix (1 of 2) Never done   FOOT EXAM  06/07/2022   HEMOGLOBIN A1C  09/23/2022    Colorectal cancer screening: Type of screening: Colonoscopy. Completed 09/07/21. Repeat every 5 years  Mammogram status: Completed 11/03/21. Repeat every year  Bone Density status: Ordered  . Pt provided with contact info and advised to call to schedule appt.  Lung Cancer Screening: (Low Dose CT Chest recommended if Age 2-80 years, 30 pack-year currently smoking OR have quit w/in 15years.) does not qualify.   Lung Cancer Screening Referral: NO  Additional Screening:  Hepatitis C Screening: does qualify; Completed 07/12/20  Vision Screening: Recommended annual ophthalmology exams for early detection of glaucoma and other disorders of the eye. Is the patient up to date with their annual eye exam?  Yes  Who is the provider or what is the name of the office in which the patient attends annual eye exams? [facility] If pt is not established with a provider, would they like to be referred to a provider to establish care? No .   Dental Screening: Recommended annual dental exams for proper oral hygiene  Community Resource Referral / Chronic Care Management: CRR required this visit?  Yes   CCM required this visit?  No      Plan:     I have  personally reviewed and noted the following in the patient's chart:   Medical and social history Use of alcohol, tobacco or illicit drugs  Current medications and supplements including opioid prescriptions. Patient is not currently taking opioid prescriptions. Functional ability and status Nutritional status Physical activity Advanced directives List of other physicians Hospitalizations, surgeries, and ER visits in previous 12 months Vitals Screenings to include cognitive, depression, and falls Referrals and appointments  In addition, I have reviewed and discussed with patient certain preventive protocols, quality metrics, and best practice recommendations. A written personalized care plan for preventive services as well as general preventive health recommendations were provided to patient.     Annabell Sabal, CMA   10/27/2022   Nurse Notes: None

## 2022-10-27 NOTE — Patient Instructions (Signed)
Olivia Romero , Thank you for taking time to come for your Medicare Wellness Visit. I appreciate your ongoing commitment to your health goals. Please review the following plan we discussed and let me know if I can assist you in the future.   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      transportation needs     Care Coordination Interventions: Patient requesting assistance with transportation to her medical appointments Patient states that she is using her [facility] transportation benefit but now has 3 rides left Transportation and Mobility Services completed-patient now in their system per [facility] and can begin to call and schedule rides to medical appointments [phone] can call up to 2 weeks before her appointment and before 12pm the day before her appointment Patient informed of transportation approval and provided with contact information to call to schedule transport to medical appointments Patient verbalized having no additional community resource needs at this time, this social worker's contact information provided-patient encouraged to call with any additional needs        This is a list of the screening recommended for you and due dates:  Health Maintenance  Topic Date Due   Zoster (Shingles) Vaccine (1 of 2) Never done   Complete foot exam   06/07/2022   Hemoglobin A1C  09/23/2022   Mammogram  11/04/2022   Eye exam for diabetics  02/25/2023   Yearly kidney function blood test for diabetes  04/20/2023   Yearly kidney health urinalysis for diabetes  04/20/2023   Medicare Annual Wellness Visit  10/27/2023   DTaP/Tdap/Td vaccine (2 - Td or Tdap) 07/12/2025   Colon Cancer Screening  09/08/2026   Hepatitis C Screening  Completed   HIV Screening  Completed   HPV Vaccine  Aged Out   Flu Shot  Discontinued   Pap  Smear  Discontinued   COVID-19 Vaccine  Discontinued    Advanced directives: Please bring a copy of your health care power of attorney and living will to the office to be added to your chart at your convenience.   Conditions/risks identified: Diabetes Mellitus and Nutrition, Adult When you have diabetes, or diabetes mellitus, it is very important to have healthy eating habits because your blood sugar (glucose) levels are greatly affected by what you eat and drink. Eating healthy foods in the right amounts, at about the same times every day, can help you: Manage your blood glucose. Lower your risk of heart disease. Improve your blood pressure. Reach or maintain a healthy weight. What can affect my meal plan? Every person with diabetes is different, and each person has different needs for a meal plan. Your health care provider may recommend that you work with a dietitian to make a meal plan that is best for you. Your meal plan may vary depending on factors such as: The calories you need. The medicines you take. Your weight. Your blood glucose, blood pressure, and cholesterol levels. Your activity level. Other health conditions you have, such as heart or kidney disease. How do carbohydrates affect me? Carbohydrates, also called carbs, affect your blood glucose level more than any other type of food. Eating carbs raises the amount of glucose in your blood. It is important to know how many carbs you can safely have in each meal.  This is different for every person. Your dietitian can help you calculate how many carbs you should have at each meal and for each snack. How does alcohol affect me? Alcohol can cause a decrease in blood glucose (hypoglycemia), especially if you use insulin or take certain diabetes medicines by mouth. Hypoglycemia can be a life-threatening condition. Symptoms of hypoglycemia, such as sleepiness, dizziness, and confusion, are similar to symptoms of having too much  alcohol. Do not drink alcohol if: Your health care provider tells you not to drink. You are pregnant, may be pregnant, or are planning to become pregnant. If you drink alcohol: Limit how much you have to: 0-1 drink a day for women. 0-2 drinks a day for men. Know how much alcohol is in your drink. In the [place] one drink equals one 12 oz bottle of beer (355 mL), one 5 oz glass of wine (148 mL), or one 1 oz glass of hard liquor (44 mL). Keep yourself hydrated with water, diet soda, or unsweetened iced tea. Keep in mind that regular soda, juice, and other mixers may contain a lot of sugar and must be counted as carbs. What are tips for following this plan?  Reading food labels Start by checking the serving size on the Nutrition Facts label of packaged foods and drinks. The number of calories and the amount of carbs, fats, and other nutrients listed on the label are based on one serving of the item. Many items contain more than one serving per package. Check the total grams (g) of carbs in one serving. Check the number of grams of saturated fats and trans fats in one serving. Choose foods that have a low amount or none of these fats. Check the number of milligrams (mg) of salt (sodium) in one serving. Most people should limit total sodium intake to less than 2,300 mg per day. Always check the nutrition information of foods labeled as "low-fat" or "nonfat." These foods may be higher in added sugar or refined carbs and should be avoided. Talk to your dietitian to identify your daily goals for nutrients listed on the label. Shopping Avoid buying canned, pre-made, or processed foods. These foods tend to be high in fat, sodium, and added sugar. Shop around the outside edge of the grocery store. This is where you will most often find fresh fruits and vegetables, bulk grains, fresh meats, and fresh dairy products. Cooking Use low-heat cooking methods, such as baking, instead of high-heat cooking methods,  such as deep frying. Cook using healthy oils, such as olive, canola, or sunflower oil. Avoid cooking with butter, cream, or high-fat meats. Meal planning Eat meals and snacks regularly, preferably at the same times every day. Avoid going long periods of time without eating. Eat foods that are high in fiber, such as fresh fruits, vegetables, beans, and whole grains. Eat 4-6 oz (112-168 g) of lean protein each day, such as lean meat, chicken, fish, eggs, or tofu. One ounce (oz) (28 g) of lean protein is equal to: 1 oz (28 g) of meat, chicken, or fish. 1 egg.  cup (62 g) of tofu. Eat some foods each day that contain healthy fats, such as avocado, nuts, seeds, and fish. What foods should I eat? Fruits Berries. Apples. Oranges. Peaches. Apricots. Plums. Grapes. Mangoes. Papayas. Pomegranates. Kiwi. Cherries. Vegetables Leafy greens, including lettuce, spinach, kale, chard, collard greens, mustard greens, and cabbage. Beets. Cauliflower. Broccoli. Carrots. Green beans. Tomatoes. Peppers. Onions. Cucumbers. Brussels sprouts. Grains Whole grains, such as whole-wheat or  whole-grain bread, crackers, tortillas, cereal, and pasta. Unsweetened oatmeal. Quinoa. Brown or wild rice. Meats and other proteins Seafood. Poultry without skin. Lean cuts of poultry and beef. Tofu. Nuts. Seeds. Dairy Low-fat or fat-free dairy products such as milk, yogurt, and cheese. The items listed above may not be a complete list of foods and beverages you can eat and drink. Contact a dietitian for more information. What foods should I avoid? Fruits Fruits canned with syrup. Vegetables Canned vegetables. Frozen vegetables with butter or cream sauce. Grains Refined white flour and flour products such as bread, pasta, snack foods, and cereals. Avoid all processed foods. Meats and other proteins Fatty cuts of meat. Poultry with skin. Breaded or fried meats. Processed meat. Avoid saturated fats. Dairy Full-fat yogurt,  cheese, or milk. Beverages Sweetened drinks, such as soda or iced tea. The items listed above may not be a complete list of foods and beverages you should avoid. Contact a dietitian for more information. Questions to ask a health care provider Do I need to meet with a certified diabetes care and education specialist? Do I need to meet with a dietitian? What number can I call if I have questions? When are the best times to check my blood glucose? Where to find more information: American Diabetes Association: diabetes.org Academy of Nutrition and Dietetics: eatright.[employer] of Diabetes and Digestive and Kidney Diseases: StageSync.si Association of Diabetes Care & Education Specialists: diabeteseducator.org Summary It is important to have healthy eating habits because your blood sugar (glucose) levels are greatly affected by what you eat and drink. It is important to use alcohol carefully. A healthy meal plan will help you manage your blood glucose and lower your risk of heart disease. Your health care provider may recommend that you work with a dietitian to make a meal plan that is best for you. This information is not intended to replace advice given to you by your health care provider. Make sure you discuss any questions you have with your health care provider. Document Revised: 12/24/2019 Document Reviewed: 12/24/2019 Elsevier Patient Education  2024 [employer].   Next appointment: Follow up in one year for your annual wellness visit. 10/28/23  Preventive Care 40-64 Years, Female Preventive care refers to lifestyle choices and visits with your health care provider that can promote health and wellness. What does preventive care include? A yearly physical exam. This is also called an annual well check. Dental exams once or twice a year. Routine eye exams. Ask your health care provider how often you should have your eyes checked. Personal lifestyle choices,  including: Daily care of your teeth and gums. Regular physical activity. Eating a healthy diet. Avoiding tobacco and drug use. Limiting alcohol use. Practicing safe sex. Taking low-dose aspirin daily starting at age 40. Taking vitamin and mineral supplements as recommended by your health care provider. What happens during an annual well check? The services and screenings done by your health care provider during your annual well check will depend on your age, overall health, lifestyle risk factors, and family history of disease. Counseling  Your health care provider may ask you questions about your: Alcohol use. Tobacco use. Drug use. Emotional well-being. Home and relationship well-being. Sexual activity. Eating habits. Work and work Astronomer. Method of birth control. Menstrual cycle. Pregnancy history. Screening  You may have the following tests or measurements: Height, weight, and BMI. Blood pressure. Lipid and cholesterol levels. These may be checked every 5 years, or more frequently if you are over  24 years old. Skin check. Lung cancer screening. You may have this screening every year starting at age 5 if you have a 30-pack-year history of smoking and currently smoke or have quit within the past 15 years. Fecal occult blood test (FOBT) of the stool. You may have this test every year starting at age 7. Flexible sigmoidoscopy or colonoscopy. You may have a sigmoidoscopy every 5 years or a colonoscopy every 10 years starting at age 47. Hepatitis C blood test. Hepatitis B blood test. Sexually transmitted disease (STD) testing. Diabetes screening. This is done by checking your blood sugar (glucose) after you have not eaten for a while (fasting). You may have this done every 1-3 years. Mammogram. This may be done every 1-2 years. Talk to your health care provider about when you should start having regular mammograms. This may depend on whether you have a family history of  breast cancer. BRCA-related cancer screening. This may be done if you have a family history of breast, ovarian, tubal, or peritoneal cancers. Pelvic exam and Pap test. This may be done every 3 years starting at age 48. Starting at age 79, this may be done every 5 years if you have a Pap test in combination with an HPV test. Bone density scan. This is done to screen for osteoporosis. You may have this scan if you are at high risk for osteoporosis. Discuss your test results, treatment options, and if necessary, the need for more tests with your health care provider. Vaccines  Your health care provider may recommend certain vaccines, such as: Influenza vaccine. This is recommended every year. Tetanus, diphtheria, and acellular pertussis (Tdap, Td) vaccine. You may need a Td booster every 10 years. Zoster vaccine. You may need this after age 84. Pneumococcal 13-valent conjugate (PCV13) vaccine. You may need this if you have certain conditions and were not previously vaccinated. Pneumococcal polysaccharide (PPSV23) vaccine. You may need one or two doses if you smoke cigarettes or if you have certain conditions. Talk to your health care provider about which screenings and vaccines you need and how often you need them. This information is not intended to replace advice given to you by your health care provider. Make sure you discuss any questions you have with your health care provider. Document Released: 06/18/2015 Document Revised: 02/09/2016 Document Reviewed: 03/23/2015 Elsevier Interactive Patient Education  2017 [employer].    Fall Prevention in the Home Falls can cause injuries. They can happen to people of all ages. There are many things you can do to make your home safe and to help prevent falls. What can I do on the outside of my home? Regularly fix the edges of walkways and driveways and fix any cracks. Remove anything that might make you trip as you walk through a door, such as a  raised step or threshold. Trim any bushes or trees on the path to your home. Use bright outdoor lighting. Clear any walking paths of anything that might make someone trip, such as rocks or tools. Regularly check to see if handrails are loose or broken. Make sure that both sides of any steps have handrails. Any raised decks and porches should have guardrails on the edges. Have any leaves, snow, or ice cleared regularly. Use sand or salt on walking paths during winter. Clean up any spills in your garage right away. This includes oil or grease spills. What can I do in the bathroom? Use night lights. Install grab bars by the toilet and in the  tub and shower. Do not use towel bars as grab bars. Use non-skid mats or decals in the tub or shower. If you need to sit down in the shower, use a plastic, non-slip stool. Keep the floor dry. Clean up any water that spills on the floor as soon as it happens. Remove soap buildup in the tub or shower regularly. Attach bath mats securely with double-sided non-slip rug tape. Do not have throw rugs and other things on the floor that can make you trip. What can I do in the bedroom? Use night lights. Make sure that you have a light by your bed that is easy to reach. Do not use any sheets or blankets that are too big for your bed. They should not hang down onto the floor. Have a firm chair that has side arms. You can use this for support while you get dressed. Do not have throw rugs and other things on the floor that can make you trip. What can I do in the kitchen? Clean up any spills right away. Avoid walking on wet floors. Keep items that you use a lot in easy-to-reach places. If you need to reach something above you, use a strong step stool that has a grab bar. Keep electrical cords out of the way. Do not use floor polish or wax that makes floors slippery. If you must use wax, use non-skid floor wax. Do not have throw rugs and other things on the floor  that can make you trip. What can I do with my stairs? Do not leave any items on the stairs. Make sure that there are handrails on both sides of the stairs and use them. Fix handrails that are broken or loose. Make sure that handrails are as long as the stairways. Check any carpeting to make sure that it is firmly attached to the stairs. Fix any carpet that is loose or worn. Avoid having throw rugs at the top or bottom of the stairs. If you do have throw rugs, attach them to the floor with carpet tape. Make sure that you have a light switch at the top of the stairs and the bottom of the stairs. If you do not have them, ask someone to add them for you. What else can I do to help prevent falls? Wear shoes that: Do not have high heels. Have rubber bottoms. Are comfortable and fit you well. Are closed at the toe. Do not wear sandals. If you use a stepladder: Make sure that it is fully opened. Do not climb a closed stepladder. Make sure that both sides of the stepladder are locked into place. Ask someone to hold it for you, if possible. Clearly mark and make sure that you can see: Any grab bars or handrails. First and last steps. Where the edge of each step is. Use tools that help you move around (mobility aids) if they are needed. These include: Canes. Walkers. Scooters. Crutches. Turn on the lights when you go into a dark area. Replace any light bulbs as soon as they burn out. Set up your furniture so you have a clear path. Avoid moving your furniture around. If any of your floors are uneven, fix them. If there are any pets around you, be aware of where they are. Review your medicines with your doctor. Some medicines can make you feel dizzy. This can increase your chance of falling. Ask your doctor what other things that you can do to help prevent falls. This information is not  intended to replace advice given to you by your health care provider. Make sure you discuss any questions you  have with your health care provider. Document Released: 03/18/2009 Document Revised: 10/28/2015 Document Reviewed: 06/26/2014 Elsevier Interactive Patient Education  2017 [employer].

## 2022-10-29 DIAGNOSIS — E1165 Type 2 diabetes mellitus with hyperglycemia: Secondary | ICD-10-CM | POA: Diagnosis not present

## 2022-11-03 ENCOUNTER — Other Ambulatory Visit: Payer: Self-pay

## 2022-11-06 DIAGNOSIS — H35341 Macular cyst, hole, or pseudohole, right eye: Secondary | ICD-10-CM | POA: Diagnosis not present

## 2022-11-06 DIAGNOSIS — E113313 Type 2 diabetes mellitus with moderate nonproliferative diabetic retinopathy with macular edema, bilateral: Secondary | ICD-10-CM | POA: Diagnosis not present

## 2022-11-06 DIAGNOSIS — H2513 Age-related nuclear cataract, bilateral: Secondary | ICD-10-CM | POA: Diagnosis not present

## 2022-11-24 ENCOUNTER — Telehealth: Payer: Self-pay | Admitting: Family

## 2022-11-24 DIAGNOSIS — R42 Dizziness and giddiness: Secondary | ICD-10-CM

## 2022-11-24 NOTE — Telephone Encounter (Signed)
Pt informed can get shrinkers at [facility]

## 2022-11-24 NOTE — Telephone Encounter (Signed)
Patient called. Would like to know where she could order the shrinkage? Her call back number is [phone]

## 2022-11-29 DIAGNOSIS — E1165 Type 2 diabetes mellitus with hyperglycemia: Secondary | ICD-10-CM | POA: Diagnosis not present

## 2022-12-14 DIAGNOSIS — E1129 Type 2 diabetes mellitus with other diabetic kidney complication: Secondary | ICD-10-CM | POA: Diagnosis not present

## 2022-12-14 DIAGNOSIS — Z789 Other specified health status: Secondary | ICD-10-CM | POA: Diagnosis not present

## 2022-12-14 DIAGNOSIS — E113293 Type 2 diabetes mellitus with mild nonproliferative diabetic retinopathy without macular edema, bilateral: Secondary | ICD-10-CM | POA: Diagnosis not present

## 2022-12-14 DIAGNOSIS — Z89512 Acquired absence of left leg below knee: Secondary | ICD-10-CM | POA: Diagnosis not present

## 2022-12-14 DIAGNOSIS — R809 Proteinuria, unspecified: Secondary | ICD-10-CM | POA: Diagnosis not present

## 2022-12-14 DIAGNOSIS — E11610 Type 2 diabetes mellitus with diabetic neuropathic arthropathy: Secondary | ICD-10-CM | POA: Diagnosis not present

## 2022-12-14 DIAGNOSIS — E781 Pure hyperglyceridemia: Secondary | ICD-10-CM | POA: Diagnosis not present

## 2022-12-14 DIAGNOSIS — E1142 Type 2 diabetes mellitus with diabetic polyneuropathy: Secondary | ICD-10-CM | POA: Diagnosis not present

## 2022-12-14 DIAGNOSIS — Z794 Long term (current) use of insulin: Secondary | ICD-10-CM | POA: Diagnosis not present

## 2022-12-14 LAB — HEMOGLOBIN A1C: Hemoglobin A1C: 7.3

## 2022-12-18 ENCOUNTER — Emergency Department: Payer: HMO

## 2022-12-18 ENCOUNTER — Ambulatory Visit (INDEPENDENT_AMBULATORY_CARE_PROVIDER_SITE_OTHER): Payer: HMO | Admitting: Family Medicine

## 2022-12-18 ENCOUNTER — Emergency Department
Admission: EM | Admit: 2022-12-18 | Discharge: 2022-12-18 | Disposition: A | Discharge status: Home / Self Care | Payer: HMO | Attending: Emergency Medicine | Admitting: Emergency Medicine

## 2022-12-18 ENCOUNTER — Encounter: Payer: Self-pay | Admitting: Family Medicine

## 2022-12-18 ENCOUNTER — Other Ambulatory Visit: Payer: Self-pay

## 2022-12-18 ENCOUNTER — Encounter: Payer: Self-pay | Admitting: Pharmacy Technician

## 2022-12-18 VITALS — BP 132/78 | HR 81 | Temp 98.0°F | Resp 16 | Ht 60.0 in | Wt 180.5 lb

## 2022-12-18 DIAGNOSIS — Z8673 Personal history of transient ischemic attack (TIA), and cerebral infarction without residual deficits: Secondary | ICD-10-CM | POA: Insufficient documentation

## 2022-12-18 DIAGNOSIS — R42 Dizziness and giddiness: Secondary | ICD-10-CM | POA: Diagnosis not present

## 2022-12-18 DIAGNOSIS — M542 Cervicalgia: Secondary | ICD-10-CM | POA: Diagnosis not present

## 2022-12-18 DIAGNOSIS — R2689 Other abnormalities of gait and mobility: Secondary | ICD-10-CM

## 2022-12-18 DIAGNOSIS — H81399 Other peripheral vertigo, unspecified ear: Secondary | ICD-10-CM | POA: Diagnosis not present

## 2022-12-18 DIAGNOSIS — I10 Essential (primary) hypertension: Secondary | ICD-10-CM | POA: Diagnosis not present

## 2022-12-18 DIAGNOSIS — I498 Other specified cardiac arrhythmias: Secondary | ICD-10-CM | POA: Diagnosis not present

## 2022-12-18 DIAGNOSIS — J45909 Unspecified asthma, uncomplicated: Secondary | ICD-10-CM | POA: Diagnosis not present

## 2022-12-18 DIAGNOSIS — E785 Hyperlipidemia, unspecified: Secondary | ICD-10-CM | POA: Diagnosis not present

## 2022-12-18 DIAGNOSIS — R3915 Urgency of urination: Secondary | ICD-10-CM

## 2022-12-18 DIAGNOSIS — E1140 Type 2 diabetes mellitus with diabetic neuropathy, unspecified: Secondary | ICD-10-CM | POA: Diagnosis not present

## 2022-12-18 DIAGNOSIS — I4510 Unspecified right bundle-branch block: Secondary | ICD-10-CM | POA: Insufficient documentation

## 2022-12-18 DIAGNOSIS — E119 Type 2 diabetes mellitus without complications: Secondary | ICD-10-CM | POA: Insufficient documentation

## 2022-12-18 DIAGNOSIS — R519 Headache, unspecified: Secondary | ICD-10-CM | POA: Diagnosis not present

## 2022-12-18 LAB — COMPREHENSIVE METABOLIC PANEL
ALT: 16 U/L (ref 0–44)
AST: 18 U/L (ref 15–41)
Albumin: 4 g/dL (ref 3.5–5.0)
Alkaline Phosphatase: 52 U/L (ref 38–126)
Anion gap: 10 (ref 5–15)
BUN: 23 mg/dL (ref 8–23)
CO2: 25 mmol/L (ref 22–32)
Calcium: 9.6 mg/dL (ref 8.9–10.3)
Chloride: 101 mmol/L (ref 98–111)
Creatinine, Ser: 0.78 mg/dL (ref 0.44–1.00)
GFR, Estimated: >60 mL/min (ref >60)
Glucose, Bld: 107 mg/dL — ABNORMAL HIGH (ref 70–99)
Potassium: 3.9 mmol/L (ref 3.5–5.1)
Sodium: 136 mmol/L (ref 135–145)
Total Bilirubin: 0.4 mg/dL (ref 0.3–1.2)
Total Protein: 7.6 g/dL (ref 6.5–8.1)

## 2022-12-18 LAB — POCT URINALYSIS DIPSTICK
Bilirubin, UA: NEGATIVE
Blood, UA: NEGATIVE
Glucose, UA: NEGATIVE
Ketones, UA: NEGATIVE
Leukocytes, UA: NEGATIVE
Nitrite, UA: NEGATIVE
Odor: NORMAL
Protein, UA: NEGATIVE
Spec Grav, UA: 1.01 (ref 1.010–1.025)
Urobilinogen, UA: 0.2 E.U./dL
pH, UA: 5 (ref 5.0–8.0)

## 2022-12-18 LAB — APTT: aPTT: 25 seconds (ref 24–36)

## 2022-12-18 LAB — CBC
HCT: 38 % (ref 36.0–46.0)
Hemoglobin: 11.9 g/dL — ABNORMAL LOW (ref 12.0–15.0)
MCH: 25.6 pg — ABNORMAL LOW (ref 26.0–34.0)
MCHC: 31.3 g/dL (ref 30.0–36.0)
MCV: 81.7 fL (ref 80.0–100.0)
Platelets: 389 10*3/uL (ref 150–400)
RBC: 4.65 MIL/uL (ref 3.87–5.11)
RDW: 14.4 % (ref 11.5–15.5)
WBC: 8.5 10*3/uL (ref 4.0–10.5)
nRBC: 0 % (ref 0.0–0.2)

## 2022-12-18 LAB — DIFFERENTIAL
Abs Immature Granulocytes: 0.04 10*3/uL (ref 0.00–0.07)
Basophils Absolute: 0.1 10*3/uL (ref 0.0–0.1)
Basophils Relative: 1 %
Eosinophils Absolute: 0.3 10*3/uL (ref 0.0–0.5)
Eosinophils Relative: 3 %
Immature Granulocytes: 1 %
Lymphocytes Relative: 30 %
Lymphs Abs: 2.6 10*3/uL (ref 0.7–4.0)
Monocytes Absolute: 0.6 10*3/uL (ref 0.1–1.0)
Monocytes Relative: 7 %
Neutro Abs: 4.9 10*3/uL (ref 1.7–7.7)
Neutrophils Relative %: 58 %

## 2022-12-18 LAB — PROTIME-INR
INR: 1 (ref 0.8–1.2)
Prothrombin Time: 13 seconds (ref 11.4–15.2)

## 2022-12-18 MED ORDER — SODIUM CHLORIDE 0.9% FLUSH: 3.0000 mL | Freq: Once | INTRAVENOUS | Status: DC

## 2022-12-18 MED ORDER — MECLIZINE HCL 25 MG PO TABS: 12.5000 mg | ORAL_TABLET | Freq: Three times a day (TID) | ORAL | 1 refills | Status: DC | PRN

## 2022-12-18 MED ORDER — ONDANSETRON 4 MG PO TBDP: 4.0000 mg | ORAL_TABLET | Freq: Three times a day (TID) | ORAL | 0 refills | Status: DC | PRN

## 2022-12-18 MED ORDER — MECLIZINE HCL 25 MG PO TABS
25.0000 mg | ORAL_TABLET | Freq: Once | ORAL | Status: AC
  Administered 2022-12-18: 25 mg via ORAL
  Filled 2022-12-18: qty 1

## 2022-12-18 MED ORDER — METOCLOPRAMIDE HCL 10 MG PO TABS
10.0000 mg | ORAL_TABLET | Freq: Once | ORAL | Status: AC
  Administered 2022-12-18: 10 mg via ORAL
  Filled 2022-12-18: qty 1

## 2022-12-18 NOTE — Telephone Encounter (Signed)
No answer from pt left detailed vm. °

## 2022-12-18 NOTE — Discharge Instructions (Addendum)
Take the medication that was prescribed by your primary care provider as needed.  If your symptoms persist you can follow-up with the ENT.  A referral has been provided.  In the meantime, return to the emergency department for new, worsening, or persistent severe dizziness, headache, vomiting, lightheadedness or feel like you are going to pass out, weakness or numbness in your arms or legs, difficulty with speech or changes in your vision, or any other new or worsening symptoms that concern you.

## 2022-12-18 NOTE — ED Triage Notes (Signed)
Arrives from [place]. REferred to ED.  C/O change in balance and headache.  Onset of symptoms yesterday at 1600.  STates when turning head to the right, dizziness worse.  Denies weakness, numbness. MAE equally and strong. Speech clear.  C/O dizziness when up walking.  Also feels swimmy headed when turning head too fast.  Also c/o neck stiffness this morning.

## 2022-12-18 NOTE — ED Provider Notes (Signed)
[name] Provider Note    Event Date/Time   First MD Initiated Contact with Patient 12/18/22 1054     (approximate)   History   Dizziness   HPI  Olivia Romero is a 67 y.o. female with a history of hypertension, hyperlipidemia, diabetes, asthma, GERD, neuropathy, and stroke who presents with dizziness, acute onset yesterday morning, described as a sensation of movement or being off balance.  It occurs when she turns her head or gets up and moves and resolves when she is lying still.  It is associated with nausea but no vomiting.  The patient has a frontal headache which is mild.  She denies any ear ringing or hearing loss.  She has no congestion or URI symptoms.  She denies any prior history of this symptom.  She has no weakness or numbness in the legs, difficulty speaking, or change in her vision.  I reviewed the past medical records.  The patient's most recent outpatient encounter was with endocrinology at [facility] on 7/11 for follow-up of her diabetes.  She has no recent hospitalizations.   Physical Exam   Triage Vital Signs: ED Triage Vitals  Encounter Vitals Group     BP 12/18/22 0939 (!) 128/59     Systolic BP Percentile --      Diastolic BP Percentile --      Pulse Rate 12/18/22 0939 71     Resp 12/18/22 0939 16     Temp 12/18/22 0939 98.6 F (37 C)     Temp Source 12/18/22 0939 Oral     SpO2 12/18/22 0939 100 %     Weight 12/18/22 0936 180 lb 5.4 oz (81.8 kg)     Height 12/18/22 0936 5' (1.524 m)     Head Circumference --      Peak Flow --      Pain Score 12/18/22 0936 0     Pain Loc --      Pain Education --      Exclude from Growth Chart --     Most recent vital signs: Vitals:   12/18/22 1130 12/18/22 1307  BP: (!) 105/92 111/89  Pulse: 80 75  Resp:  16  Temp:  98.2 F (36.8 C)  SpO2:  99%     General: Alert, well-appearing, no distress.  CV:  Good peripheral perfusion.  Resp:  Normal effort.  Abd:  No  distention.  Other:  EOMI.  PERRLA.  No photophobia.  No significant nystagmus.  Cranial nerves III through XII grossly intact.  Motor and sensory intact in all extremities.  No pronator drift.  No ataxia on finger-to-nose.   ED Results / Procedures / Treatments   Labs (all labs ordered are listed, but only abnormal results are displayed) Labs Reviewed  COMPREHENSIVE METABOLIC PANEL - Abnormal; Notable for the following components:      Result Value   Glucose, Bld 107 (*)    All other components within normal limits  CBC - Abnormal; Notable for the following components:   Hemoglobin 11.9 (*)    MCH 25.6 (*)    All other components within normal limits  PROTIME-INR  APTT  DIFFERENTIAL  ETHANOL  I-STAT CREATININE, ED  CBG MONITORING, ED     EKG  ED ECG REPORT I, Dionne Bucy, the attending physician, personally viewed and interpreted this ECG.  Date: 12/18/2022 EKG Time: 0949 Rate: 72 Rhythm: normal sinus rhythm QRS Axis: normal Intervals: Incomplete RBBB ST/T Wave abnormalities: Nonspecific  T wave abnormalities Narrative Interpretation: no evidence of acute ischemia    RADIOLOGY  CT head: I independently viewed and interpreted the images; there is no ICH.  Radiology report indicates no acute abnormality.   PROCEDURES:  Critical Care performed: No  Procedures   MEDICATIONS ORDERED IN ED: Medications  sodium chloride flush (NS) 0.9 % injection 3 mL (has no administration in time range)  metoCLOPramide (REGLAN) tablet 10 mg (10 mg Oral Given 12/18/22 1132)  meclizine (ANTIVERT) tablet 25 mg (25 mg Oral Given 12/18/22 1132)     IMPRESSION / MDM / ASSESSMENT AND PLAN / ED COURSE  I reviewed the triage vital signs and the nursing notes.  67 year old female with PMH as noted above presents with acute onset of spinning and movement type dizziness yesterday which is exacerbated with movement and extinguishes when lying still.  On exam she is well-appearing  with normal vital signs.  Thorough neurologic exam is normal.  Differential diagnosis includes, but is not limited to, peripheral vertigo, likely either BPPV or possible lab otitis.  There is no evidence of central vertigo.  Patient's presentation is most consistent with acute complicated illness / injury requiring diagnostic workup.  The patient is on the cardiac monitor to evaluate for evidence of arrhythmia and/or significant heart rate changes.   CT head is negative for acute findings.  CMP shows normal electrolytes.  There is no leukocytosis or anemia on the CBC.  We will treat symptomatically with meclizine and Reglan.  I discussed with the patient the unlikely possibility of central vertigo and whether we should obtain an MRI for further evaluation.  Based on shared decision making, the patient declines further diagnostics.  ----------------------------------------- 1:00 PM on 12/18/2022 -----------------------------------------  The patient has not had a significant improvement in her dizziness although feels comfortable and would like to go home.  I counseled her on the results of the workup and plan of care.  I gave her strict return precautions and she expressed understanding.  She has already been prescribed meclizine by her primary care provider to pick up today.  FINAL CLINICAL IMPRESSION(S) / ED DIAGNOSES   Final diagnoses:  Peripheral vertigo, unspecified laterality     Rx / DC Orders   ED Discharge Orders     None        Note:  This document was prepared using Dragon voice recognition software and may include unintentional dictation errors.    Dionne Bucy, MD 12/18/22 1600

## 2022-12-18 NOTE — Telephone Encounter (Signed)
Copied from CRM [id number]. Topic: General - Other >> Dec 18, 2022  2:29 PM Franchot Heidelberg wrote: Reason for CRM: Pt called back to report that the ED MD believes she does have vertigo and told her to contact her PCP  Best contact:  [phone]

## 2022-12-18 NOTE — ED Notes (Signed)
Patient Alert and oriented to baseline. Stable and ambulatory to baseline. Patient verbalized understanding of the discharge instructions.  Patient belongings were taken by the patient.   

## 2022-12-18 NOTE — Addendum Note (Signed)
Addended by: Forde Radon on: 12/18/2022 09:41 AM   Modules accepted: Orders

## 2022-12-18 NOTE — Progress Notes (Signed)
Patient ID: Olivia Romero, female    DOB: 1955-12-30, 67 y.o.   MRN: [id number]  PCP: Olivia Berry, PA-C  Chief Complaint  Patient presents with   Dizziness   off balance    Mainly when walking   Headache    All sx started yesterday evening   Nausea    Subjective:   Olivia Romero is a 67 y.o. female, presents to clinic with CC of the following:  Dizziness This is a new problem. The current episode started yesterday. The problem occurs intermittently. The problem has been unchanged. Associated symptoms include headaches (mild headache onset after dizziness episode), nausea and vertigo. Pertinent negatives include no abdominal pain, anorexia, arthralgias, change in bowel habit, chest pain, chills, congestion, coughing, diaphoresis, fatigue, fever, joint swelling, myalgias, neck pain, numbness, rash, sore throat, swollen glands, urinary symptoms, visual change, vomiting or weakness. Exacerbated by: moving head side to side and getting up. She has tried nothing for the symptoms. The treatment provided no relief.    She had something similar 25-30 years ago  Second episode occurred at church when looking to her friend sitting to the left She is having the dizzy episodes with turning or moving head but her balance is constantly not normal and she has some dull HA and neck pain, feels sick to her stomach   Olivia Romero her friend brought her today - who drove - pt does not drive  CMA states she had to assist pt into exam room, put her in [facility], unable to ambulate by herself - she has braces that she normally uses to walk   Patient Active Problem List   Diagnosis Date Noted   Microalbuminuria 04/18/2022   History of diastolic dysfunction 04/18/2022   Mitral valve insufficiency 04/18/2022   Statin myopathy 11/23/2021   Left lower quadrant abdominal tenderness without rebound tenderness 08/03/2021   History of colitis 08/03/2021   Change in bowel habits 08/03/2021   Generalized  abdominal pain 08/03/2021   Anemia 01/21/2021   Urticaria 10/21/2020   Rash and nonspecific skin eruption 07/27/2020   Hepatotoxicity due to statin drug 07/15/2020   Vitamin D deficiency 07/12/2020   Chronic low back pain 07/12/2020   BMI 36.0-36.9,adult 12/29/2019   Sepsis without acute organ dysfunction (HCC) 12/29/2019   Post-operative state 12/29/2019   Asthma 12/29/2019   Cervical disc disorder 12/29/2019   Displacement of cervical intervertebral disc 12/29/2019   Abnormal posture 12/29/2019   Brachial radiculitis 12/29/2019   Neck pain 12/29/2019   Pain in upper limb 12/29/2019   Whiplash injury to neck 12/29/2019   Allergy to statin medication 12/01/2019   History of below knee amputation, left (HCC) 07/30/2019   Gangrene of left foot (HCC) 05/16/2019   Charcot's joint of foot, left    PAD (peripheral artery disease) (HCC) 12/05/2018   MRSA bacteremia 12/05/2018   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) 09/18/2018   Class 2 severe obesity with serious comorbidity and body mass index (BMI) of 37.0 to 37.9 in adult (HCC) 11/19/2017   B12 deficiency 11/01/2017   Diabetic polyneuropathy associated with type 2 diabetes mellitus (HCC) 12/13/2015   Lumbar stenosis with neurogenic claudication 09/02/2015   Chronic constipation 07/13/2015   OSA (obstructive sleep apnea) 06/25/2015   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/23/2015   Hyperlipidemia 04/23/2015   Statin intolerance 04/23/2015   Asthma, mild intermittent 02/01/2015   Type 2 diabetes mellitus with diabetic neuropathy, with long-term current use of insulin (HCC) 12/31/2014  Gastroesophageal reflux disease with esophagitis 12/31/2014   Dysphagia 12/31/2014   Bilateral carpal tunnel syndrome 12/01/2014   Diverticulosis of colon 11/30/2012   Benign essential hypertension 11/28/2012      Current Outpatient Medications:    acetaminophen (TYLENOL) 500 MG tablet, Take 1,000 mg by mouth every 6 (six)  hours as needed for mild pain. Maximum of 3,000 mg per day, Disp: , Rfl:    albuterol (VENTOLIN HFA) 108 (90 Base) MCG/ACT inhaler, INHALE 2 PUFFS INTO THE LUNGS EVERY 4 HOURS AS NEEDED, Disp: 6.7 g, Rfl: 0   allopurinol (ZYLOPRIM) 100 MG tablet, Take 1 tablet (100 mg total) by mouth 2 (two) times daily., Disp: 60 tablet, Rfl: 3   aspirin EC 325 MG tablet, Take 1 tablet (325 mg total) by mouth daily., Disp: 30 tablet, Rfl: 0   Blood Glucose Monitoring Suppl (CONTOUR NEXT MONITOR) w/Device KIT, as directed., Disp: , Rfl:    Colchicine 0.6 MG CAPS, Take 0.6 mg by mouth 2 (two) times daily as needed., Disp: 60 capsule, Rfl: 1   DULoxetine (CYMBALTA) 60 MG capsule, Take 120 mg by mouth at bedtime., Disp: , Rfl:    ezetimibe (ZETIA) 10 MG tablet, Take 1 tablet (10 mg total) by mouth daily., Disp: 90 tablet, Rfl: 1   fenofibrate (TRICOR) 145 MG tablet, Take 1 tablet (145 mg total) by mouth daily., Disp: 90 tablet, Rfl: 1   furosemide (LASIX) 20 MG tablet, Take 0.5-1 tablets (10-20 mg total) by mouth daily as needed. For severe fluid retention in lower extremity.  Take in am with potassium PRN, Disp: 10 tablet, Rfl: 0   gabapentin (NEURONTIN) 100 MG capsule, Take 100 mg by mouth at bedtime., Disp: , Rfl:    gabapentin (NEURONTIN) 800 MG tablet, Take 800 mg by mouth 2 (two) times daily., Disp: , Rfl:    glimepiride (AMARYL) 2 MG tablet, Take 2 mg by mouth 2 (two) times daily. , Disp: , Rfl:    hydrochlorothiazide (HYDRODIURIL) 25 MG tablet, Take 1 tablet (25 mg total) by mouth daily., Disp: 90 tablet, Rfl: 0   Incontinence Supply Disposable (ASSURANCE FITTED BRIEF LARGE) MISC, Use 1 brief up to 6 times a day as needed for urinary incontinence, Disp: 180 each, Rfl: 5   insulin degludec (TRESIBA) 200 UNIT/ML FlexTouch Pen, Inject 20 Units into the skin at bedtime. , Disp: , Rfl:    mesalamine (LIALDA) 1.2 g EC tablet, Take 1.2 g by mouth daily., Disp: , Rfl:    metFORMIN (GLUCOPHAGE-XR) 500 MG 24 hr tablet,  Take 500 mg by mouth 3 (three) times daily. , Disp: , Rfl:    Multiple Vitamins-Minerals (MULTIVITAMIN GUMMIES WOMENS PO), Take 1 tablet by mouth daily., Disp: , Rfl:    pantoprazole (PROTONIX) 40 MG tablet, Take 1 tablet (40 mg total) by mouth daily as needed (Heartburn). (Patient taking differently: Take 40 mg by mouth daily.), Disp: 90 tablet, Rfl: 2   polyethylene glycol (MIRALAX / GLYCOLAX) 17 g packet, Take 17 g by mouth every other day., Disp: , Rfl:    potassium chloride SA (KLOR-CON M) 20 MEQ tablet, Take 1 tablet (20 mEq total) by mouth daily as needed (take on days when taking lasix/furosemide)., Disp: 20 tablet, Rfl: 0   ULTICARE SHORT PEN NEEDLES 31G X 8 MM MISC, , Disp: , Rfl:    Semaglutide,0.25 or 0.5MG /DOS, 2 MG/3ML SOPN, Inject 0.25 mg into the skin once a week. For 4 weeks (Patient not taking: Reported on 12/18/2022), Disp: 3 mL,  Rfl: 0   Allergies  Allergen Reactions   Egg-Derived Products Swelling and Other (See Comments)    Angioedema   Atorvastatin Other (See Comments)    Liver toxicity   Influenza Virus Vaccine Swelling    Arm swelled (site of injection)   Latex Itching   Pravastatin Itching and Rash   Tape Rash and Other (See Comments)    Adhesive on foot pull off skin.  Ok to use paper tape.     Social History   Tobacco Use   Smoking status: Never   Smokeless tobacco: Never  Vaping Use   Vaping status: Never Used  Substance Use Topics   Alcohol use: Never   Drug use: No      Chart Review Today: I personally reviewed active problem list, medication list, allergies, family history, social history, health maintenance, notes from last encounter, lab results, imaging with the patient/caregiver today.   Review of Systems  Constitutional: Negative.  Negative for activity change, appetite change, chills, diaphoresis, fatigue, fever and unexpected weight change.  HENT: Negative.  Negative for congestion and sore throat.   Eyes: Negative.   Respiratory:  Negative.  Negative for cough, choking, chest tightness, shortness of breath and wheezing.   Cardiovascular: Negative.  Negative for chest pain, palpitations and leg swelling.  Gastrointestinal:  Positive for nausea. Negative for abdominal pain, anorexia, change in bowel habit and vomiting.  Endocrine: Negative.   Genitourinary: Negative.   Musculoskeletal: Negative.  Negative for arthralgias, joint swelling, myalgias and neck pain.  Skin: Negative.  Negative for rash.  Allergic/Immunologic: Negative.   Neurological:  Positive for dizziness, vertigo and headaches (mild headache onset after dizziness episode). Negative for tremors, syncope, weakness, light-headedness and numbness.  Hematological: Negative.   Psychiatric/Behavioral: Negative.    All other systems reviewed and are negative.      Objective:   Vitals:   12/18/22 0827  BP: 132/78  Pulse: 81  Resp: 16  Temp: 98 F (36.7 C)  TempSrc: Oral  SpO2: 98%  Weight: 180 lb 8 oz (81.9 kg)  Height: 5' (1.524 m)    Body mass index is 35.25 kg/m.  Physical Exam Vitals and nursing note reviewed.  Constitutional:      General: She is not in acute distress.    Appearance: She is not toxic-appearing or diaphoretic.  Cardiovascular:     Rate and Rhythm: Normal rate and regular rhythm.     Pulses: Normal pulses.     Heart sounds: Normal heart sounds. No murmur heard.    No friction rub. No gallop.  Pulmonary:     Effort: Pulmonary effort is normal. No respiratory distress.     Breath sounds: Normal breath sounds.  Musculoskeletal:     Right lower leg: No edema.     Comments: Left AKA Compression stocking to RLE with ankle brace  Neurological:     Mental Status: She is alert.     Motor: No tremor or seizure activity.     Coordination: Finger-Nose-Finger Test normal. Rapid alternating movements normal.     Gait: Gait abnormal.     Comments: CRANIAL NERVES:   II: Pupils equal and reactive, no RAPD   III, IV, VI: EOM  intact, no gaze preference or deviation, no nystagmus. HA sx worsened with testing EOM   V: normal sensation in V1, V2, and V3 segments bilaterally   VII: no asymmetry, no nasolabial fold flattening   VIII: normal hearing to speech   IX, X: normal palatal  elevation, no uvular deviation   [id number] to assess head turn and 5/5 shoulder shrug bilaterally   XII: midline tongue protrusion  MOTOR:  5/5 bilateral grip strength 5/5 flexion at hips bilaterally    SENSORY:  Normal to light touch in all extrmeities  Unable to assess - Romberg   COORD: Normal finger to nose and heel to shin, no tremor, no dysmetria  STATION: unable to asses  GAIT: unable to assess - gait unsteady/balance off    Psychiatric:        Mood and Affect: Mood normal.        Behavior: Behavior normal.      Results for orders placed or performed in visit on 06/27/22  ECHOCARDIOGRAM COMPLETE  Result Value Ref Range   AR max vel 2.00 cm2   AV Peak grad 14.3 mmHg   Ao pk vel 1.89 m/s   Area-P 1/2 3.53 cm2   AV Area VTI 1.86 cm2   AV Mean grad 7.0 mmHg   Single Plane A4C EF 54.7 %   AV Area mean vel 2.08 cm2   Est EF 60 - 65%    *Note: Due to a large number of results and/or encounters for the requested time period, some results have not been displayed. A complete set of results can be found in Results Review.       Assessment & Plan:   1. Vertigo Sudden onset yesterday when standing up and then reproduced severely when turning head -however the loss of balance has been persistent and not episodic and she has new accompanying HA and neck pain  2. Neck pain Onset yesterday  3. Acute nonintractable headache, unspecified headache type Onset yesterday  4. Loss of balance Sounds like initially BPPV vertigo episode that was brought on when standing up and then later when turning head - but loss of balance has been persistent since inset of vertigo sx, feels like she is walking sideways Only able to  evaluate her in [facility] due to instability and she is AKA on left Coordination in chair what I am able to eval is w/o focal deficit and CN that I am able to eval also w/o deficit - she cannot/will not turn head    Pt is not safe to go home alone or attempt to ambulate by herself or do trial of outpt meds at this time with sig balance issues and with assosciated HA and neck pain - referred to ER   Will obtain urine culture to r/o a possibly contributing UTI If ED workup r/o emergent vertigo etiologies will get pt meclizine, zofran and outpt PT/vestibular rehab   Olivia Berry, PA-C 12/18/22 8:47 AM

## 2022-12-18 NOTE — Addendum Note (Signed)
Addended by: Danelle Berry on: 12/18/2022 04:03 PM   Modules accepted: Orders

## 2022-12-19 LAB — URINALYSIS, ROUTINE W REFLEX MICROSCOPIC
Bilirubin Urine: NEGATIVE
Glucose, UA: NEGATIVE
Hgb urine dipstick: NEGATIVE
Ketones, ur: NEGATIVE
Leukocytes,Ua: NEGATIVE
Nitrite: NEGATIVE
Protein, ur: NEGATIVE
Specific Gravity, Urine: 1.008 (ref 1.001–1.035)
pH: 5.5 (ref 5.0–8.0)

## 2022-12-19 LAB — URINE CULTURE
MICRO NUMBER:: 15202943
Result:: NO GROWTH
SPECIMEN QUALITY:: ADEQUATE

## 2022-12-26 ENCOUNTER — Ambulatory Visit: Payer: HMO | Attending: Family Medicine

## 2022-12-26 DIAGNOSIS — R2689 Other abnormalities of gait and mobility: Secondary | ICD-10-CM | POA: Insufficient documentation

## 2022-12-26 DIAGNOSIS — R2681 Unsteadiness on feet: Secondary | ICD-10-CM | POA: Diagnosis not present

## 2022-12-26 DIAGNOSIS — R42 Dizziness and giddiness: Secondary | ICD-10-CM | POA: Diagnosis not present

## 2022-12-26 NOTE — Therapy (Signed)
OUTPATIENT PHYSICAL THERAPY VESTIBULAR EVALUATION     Patient Name: Olivia Romero MRN: [id number] DOB:1955/08/28, 67 y.o., female Today's Date: 12/26/2022  END OF SESSION:  PT End of Session - 12/26/22 0929     Visit Number 1    Number of Visits 17    Date for PT Re-Evaluation 02/23/23    Authorization Type HTA (VL: Based on MN)    Progress Note Due on Visit 10    PT Start Time 0932    PT Stop Time 1015    PT Time Calculation (min) 43 min    Equipment Utilized During Treatment Gait belt    Activity Tolerance Other (comment)   Limited secondary to dizziness   Behavior During Therapy WFL for tasks assessed/performed             Past Medical History:  Diagnosis Date   Anemia    Arthritis    Asthma    Colon polyps    adenomatous   Diabetes mellitus without complication (HCC)    Diabetic infection of left foot (HCC) 12/2018   Diverticulosis of colon    Esophagitis    GERD (gastroesophageal reflux disease)    Hemorrhoid    internal   Hyperlipemia    Hypertension    IBS (irritable bowel syndrome)    no current prob - diet controlled   Myalgia due to statin 11/19/2017   Neuropathy    Neuropathy of both feet    Neuropathy of hand    Pneumonia    x 4   PONV (postoperative nausea and vomiting)    on some surgeries but not all procedures   Skin ulcer of right ankle, limited to breakdown of skin (HCC) 12/31/2016   resolved per patient 05/14/19   Sleep apnea    has had in the past lost 50 pounds and do longer uses cpap   Statin intolerance 04/23/2015   Stroke (HCC) 2009   mini stroke per patient   Stroke-like episode 2009   TIA - mini stroke per patient   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 07/13/2015   Uncontrolled type 2 diabetes mellitus with hyperglycemia (HCC) 11/01/2017   Past Surgical History:  Procedure Laterality Date   ABDOMINAL HYSTERECTOMY     AMPUTATION Left 12/06/2018   Procedure: PARTIAL AMPUTATION LEFT FOOT;  Surgeon:  Felecia Shelling, DPM;  Location: [facility] OR;  Service: Podiatry;  Laterality: Left;   AMPUTATION Left 05/16/2019   Procedure: LEFT BELOW KNEE AMPUTATION;  Surgeon: Nadara Mustard, MD;  Location: [facility] OR;  Service: Orthopedics;  Laterality: Left;   APPENDECTOMY     removed in 11th grade   BACK SURGERY  09/02/2015   BONE BIOPSY Left 12/06/2018   Procedure: Bone Biopsy;  Surgeon: Felecia Shelling, DPM;  Location: [facility] OR;  Service: Podiatry;  Laterality: Left;   CERVICAL FUSION     CESAREAN SECTION  1986   COLONOSCOPY N/A 11/30/2012   Procedure: COLONOSCOPY;  Surgeon: Hilarie Fredrickson, MD;  Location: [facility] ENDOSCOPY;  Service: Endoscopy;  Laterality: N/A;   COLONOSCOPY N/A 09/05/2021   Procedure: COLONOSCOPY;  Surgeon: Regis Bill, MD;  Location: [facility] ENDOSCOPY;  Service: Endoscopy;  Laterality: N/A;   COLONOSCOPY WITH PROPOFOL N/A 01/03/2016   Procedure: COLONOSCOPY WITH PROPOFOL;  Surgeon: Scot Jun, MD;  Location: [facility] ENDOSCOPY;  Service: Endoscopy;  Laterality: N/A;   ESOPHAGOGASTRODUODENOSCOPY N/A 09/05/2021   Procedure: ESOPHAGOGASTRODUODENOSCOPY (EGD);  Surgeon: Regis Bill, MD;  Location: [facility] ENDOSCOPY;  Service:  Endoscopy;  Laterality: N/A;   ESOPHAGOGASTRODUODENOSCOPY (EGD) WITH PROPOFOL N/A 01/14/2015   Procedure: ESOPHAGOGASTRODUODENOSCOPY (EGD) WITH PROPOFOL;  Surgeon: Scot Jun, MD;  Location: [facility] ENDOSCOPY;  Service: Endoscopy;  Laterality: N/A;   IRRIGATION AND DEBRIDEMENT FOOT Left 03/13/2018   Procedure: IRRIGATION AND DEBRIDEMENT FOOT WITH BONE BIOPSY WITH MISONIX DEBRIDER;  Surgeon: Park Liter, DPM;  Location: [facility] OR;  Service: Podiatry;  Laterality: Left;   IRRIGATION AND DEBRIDEMENT FOOT Left 12/06/2018   Procedure: Irrigation And Debridement Foot;  Surgeon: Felecia Shelling, DPM;  Location: [facility] OR;  Service: Podiatry;  Laterality: Left;   LUMBAR WOUND DEBRIDEMENT N/A 10/01/2015   Procedure: LUMBAR WOUND DEBRIDEMENT;  Surgeon: Coletta Memos, MD;  Location:  [facility] NEURO ORS;  Service: Neurosurgery;  Laterality: N/A;  LUMBAR WOUND DEBRIDEMENT   NASAL SINUS SURGERY     SAVORY DILATION N/A 01/14/2015   Procedure: SAVORY DILATION;  Surgeon: Scot Jun, MD;  Location: [facility] ENDOSCOPY;  Service: Endoscopy;  Laterality: N/A;   TONSILLECTOMY     WISDOM TOOTH EXTRACTION     Patient Active Problem List   Diagnosis Date Noted   Microalbuminuria 04/18/2022   History of diastolic dysfunction 04/18/2022   Mitral valve insufficiency 04/18/2022   Statin myopathy 11/23/2021   Left lower quadrant abdominal tenderness without rebound tenderness 08/03/2021   History of colitis 08/03/2021   Change in bowel habits 08/03/2021   Generalized abdominal pain 08/03/2021   Anemia 01/21/2021   Urticaria 10/21/2020   Rash and nonspecific skin eruption 07/27/2020   Hepatotoxicity due to statin drug 07/15/2020   Vitamin D deficiency 07/12/2020   Chronic low back pain 07/12/2020   BMI 36.0-36.9,adult 12/29/2019   Sepsis without acute organ dysfunction (HCC) 12/29/2019   Post-operative state 12/29/2019   Asthma 12/29/2019   Cervical disc disorder 12/29/2019   Displacement of cervical intervertebral disc 12/29/2019   Abnormal posture 12/29/2019   Brachial radiculitis 12/29/2019   Neck pain 12/29/2019   Pain in upper limb 12/29/2019   Whiplash injury to neck 12/29/2019   Allergy to statin medication 12/01/2019   History of below knee amputation, left (HCC) 07/30/2019   Gangrene of left foot (HCC) 05/16/2019   Charcot's joint of foot, left    PAD (peripheral artery disease) (HCC) 12/05/2018   MRSA bacteremia 12/05/2018   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) 09/18/2018   Class 2 severe obesity with serious comorbidity and body mass index (BMI) of 37.0 to 37.9 in adult (HCC) 11/19/2017   B12 deficiency 11/01/2017   Diabetic polyneuropathy associated with type 2 diabetes mellitus (HCC) 12/13/2015   Lumbar stenosis with neurogenic claudication 09/02/2015    Chronic constipation 07/13/2015   OSA (obstructive sleep apnea) 06/25/2015   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/23/2015   Hyperlipidemia 04/23/2015   Statin intolerance 04/23/2015   Asthma, mild intermittent 02/01/2015   Type 2 diabetes mellitus with diabetic neuropathy, with long-term current use of insulin (HCC) 12/31/2014   Gastroesophageal reflux disease with esophagitis 12/31/2014   Dysphagia 12/31/2014   Bilateral carpal tunnel syndrome 12/01/2014   Diverticulosis of colon 11/30/2012   Benign essential hypertension 11/28/2012    PCP: Danelle Berry, PA-C REFERRING PROVIDER: Danelle Berry, PA-C  REFERRING DIAG: R42 (ICD-10-CM) - Vertigo  THERAPY DIAG:  Dizziness and giddiness  Unsteadiness on feet  Other abnormalities of gait and mobility  ONSET DATE: 12/18/2022  Rationale for Evaluation and Treatment: Rehabilitation  SUBJECTIVE:   SUBJECTIVE STATEMENT: Recent ED visit for Dizziness on 7/15, presents with  spinning exacerbated with movement. Patient reports spinning with head movement/turns, and also had associated nausea. Reports feels like her vision is crossing. Patient reports sensations lasts seconds to minute. Patient denies falls. Patient reports that she has a mild sensation when she rolls in bed. Denies vision or hearing changes. Denies tinnitus or aural fullness. Patient reports has also had headaches.  Pt accompanied by:  Husband (in [place])   PERTINENT HISTORY: HTN, HLD, Diabetes Type 2, Asthma, GERD, Neuropathy, Stroke, L BKA, Mitral Valve Insufficency, Anemia, Low Back Pain, Cervical Disc Disorder, Charcot Foot, PAD, OA  PAIN:  Are you having pain? No  Vitals: 135/65, HR: 83  PRECAUTIONS: Fall  RED FLAGS: None   WEIGHT BEARING RESTRICTIONS: No  FALLS: Has patient fallen in last 6 months? No  LIVING ENVIRONMENT: Lives with: lives with their family and lives with their spouse Lives in: House/apartment  PLOF: Independent with  household mobility with device  PATIENT GOALS: feel better  OBJECTIVE:   DIAGNOSTIC FINDINGS: 12/18/2022: Head CT: No evidence of an acute intracranial abnormality.   COGNITION: Overall cognitive status: Within functional limits for tasks assessed   SENSATION: Hx of Neuropathy  STRENGTH: generalized weakness  CERVICAL ROM: Limited Cervical Rotation/Extension noted on evaluation, no formal measurements taken   TRANSFERS: Assistive device utilized:  Loftstrand Crutches   Sit to stand: SBA Stand to sit: SBA Chair to chair: SBA GAIT: Gait pattern:  not assessed this date Comments: patient uses loftstrands for ambulation, no formal gait assessment this date.   PATIENT SURVEYS:  FOTO unable to assess due to time constraints, patient with severe dizziness.   VESTIBULAR ASSESSMENT   SYMPTOM BEHAVIOR:  Subjective history: See Subjective Above  Non-Vestibular symptoms: headaches and nausea/vomiting  Type of dizziness: Imbalance (Disequilibrium), Spinning/Vertigo, and Unsteady with head/body turns  Frequency: daily  Duration: seconds to minutes  Aggravating factors: Induced by position change: rolling to the right and rolling to the left and Induced by motion: turning body quickly and turning head quickly  Relieving factors: head stationary  Progression of symptoms: unchanged  OCULOMOTOR EXAM:  Ocular Alignment: normal  Ocular ROM: No Limitations  Spontaneous Nystagmus: absent  Gaze-Induced Nystagmus: absent  Smooth Pursuits: saccades  Saccades: hypometric/undershoots and slow; multiple saccades required to reach target, undershoots  Convergence/Divergence: 10 cm (no double vision; but significant dizziness with tracking)  VESTIBULAR - OCULAR REFLEX:   Slow VOR: Normal; nod dizziness, unable to maintain eyes focused.   VOR Cancellation: Unable to Maintain Gaze; difficulty maintain gaze, increased postural sway (seated), moderate dizziness  Head-Impulse Test: HIT Right:  negative HIT Left: positive; severe dizziness with completion to L, steadying assist required  Dynamic Visual Acuity: Not able to be assessed due to severe symptoms   POSITIONAL TESTING: Right Roll Test: no nystagmus Left Roll Test: no nystagmus Right Sidelying: no nystagmus Left Sidelying: no nystagmus  MOTION SENSITIVITY:  Motion Sensitivity Quotient Intensity: 0 = none, 1 = Lightheaded, 2 = Mild, 3 = Moderate, 4 = Severe, 5 = Vomiting  Intensity  1. Sitting to supine 0  2. Supine to L side 1  3. Supine to R side 1  4. Supine to sitting 3  5. L Hallpike-Dix (Sidelying) 0  6. Up from L  3  7. R Hallpike-Dix (Sidelying) 0  8. Up from R  3  9. Sitting, head tipped to L knee   10. Head up from L knee   11. Sitting, head tipped to R knee   12. Head up from  R knee   13. Sitting head turns x5 4  14.Sitting head nods x5 4  15. In stance, 180 turn to L    16. In stance, 180 turn to R     OTHOSTATICS: plan to assess at next visit  FUNCTIONAL GAIT:  unable to assess due to severe dizziness; unsafe. Will be assessed at future session.    PATIENT EDUCATION: Education details: Educated on following up with Ophthalmologist; Eval Findings, POC Person educated: Patient Education method: Explanation Education comprehension: verbalized understanding  HOME EXERCISE PROGRAM:  GOALS: Goals reviewed with patient? Yes  SHORT TERM GOALS: Target date: 01/26/2023  Pt will be independent with initial vestibular/balance HEP for improved vestibular input and reduced motion sensitivity  Baseline: no HEP established Goal status: INITIAL  2.  Pt will report </= 3/5 for all movements on MSQ to indicate improvement in motion sensitivity and improved activity tolerance.  Baseline: 4/5 Goal status: INITIAL   LONG TERM GOALS: Target date: 02/23/2023  Pt will be independent with final vestibular/balance HEP for improved vestibular input and reduced motion sensitivity  Baseline: no HEP  established Goal status: INITIAL  2.  Pt will report </= 2/5 for all movements on MSQ to indicate improvement in motion sensitivity and improved activity tolerance.  Baseline: 4/5 Goal status: INITIAL  3.  Pt will be able to complete gaze stabilization for >/= 30 seconds with less than </= 5/10 dizziness.  Baseline: significant/severe dizziness with VOR Goal status: INITIAL  4.  Pt will report 50% improvement in dizziness with functional activities including bed mobility and head turns Baseline: severe dizziness Goal status: INITIAL   ASSESSMENT:  CLINICAL IMPRESSION: Patient is a 67 y.o. female referred to Neuro OPPT services for Dizziness. Patient's PMH significant for the following: HTN, HLD, Diabetes Type 2, Asthma, GERD, Neuropathy, Stroke, L BKA, Mitral Valve Insufficency, Anemia, Low Back Pain, Cervical Disc Disorder, Charcot Foot, PAD, OA . Upon evaluation, patient presents with the following impairments: abnormal oculomotor exam, impaired smooth pursuits and saccades with patient severe dizziness.  Patient with increased motion sensitivity, positive HIT on L. Unable to assess DVA due to severe symptoms. Patient presenting also with impaired balance (noticed in seated, with postural lean) with activities. Patient will benefit from skilled PT services to address impairments.    OBJECTIVE IMPAIRMENTS: Abnormal gait, decreased activity tolerance, decreased balance, decreased mobility, difficulty walking, decreased ROM, dizziness, impaired sensation, and pain.   ACTIVITY LIMITATIONS: bending, standing, transfers, bed mobility, and locomotion level  PARTICIPATION LIMITATIONS: cleaning and community activity  PERSONAL FACTORS: Age and 3+ comorbidities: HTN, HLD, Diabetes Type 2, Asthma, GERD, Neuropathy, Stroke, L BKA, Mitral Valve Insufficency, Anemia, Low Back Pain, Cervical Disc Disorder, Charcot Foot, PAD, OA  are also affecting patient's functional outcome.   REHAB POTENTIAL:  Good  CLINICAL DECISION MAKING: Evolving/moderate complexity  EVALUATION COMPLEXITY: Moderate   PLAN:  PT FREQUENCY: 2x/week  PT DURATION: 8 weeks  PLANNED INTERVENTIONS: Therapeutic exercises, Therapeutic activity, Neuromuscular re-education, Balance training, Gait training, Patient/Family education, Self Care, Joint mobilization, Stair training, Vestibular training, Canalith repositioning, Orthotic/Fit training, DME instructions, Moist heat, and Manual therapy  PLAN FOR NEXT SESSION: Assess Orthostatics. Reassess BPPV testing if warranted. Initiate HEP focused on VOR and Smooth Pursuits/Saccades, and Habituation.    Creed Copper Fairly, PT, DPT 12/26/22 11:35 AM

## 2022-12-28 ENCOUNTER — Ambulatory Visit: Payer: HMO

## 2022-12-29 DIAGNOSIS — E113313 Type 2 diabetes mellitus with moderate nonproliferative diabetic retinopathy with macular edema, bilateral: Secondary | ICD-10-CM | POA: Diagnosis not present

## 2022-12-29 DIAGNOSIS — H81393 Other peripheral vertigo, bilateral: Secondary | ICD-10-CM | POA: Diagnosis not present

## 2022-12-29 DIAGNOSIS — E1165 Type 2 diabetes mellitus with hyperglycemia: Secondary | ICD-10-CM | POA: Diagnosis not present

## 2022-12-29 DIAGNOSIS — H2513 Age-related nuclear cataract, bilateral: Secondary | ICD-10-CM | POA: Diagnosis not present

## 2022-12-29 DIAGNOSIS — H35341 Macular cyst, hole, or pseudohole, right eye: Secondary | ICD-10-CM | POA: Diagnosis not present

## 2022-12-29 LAB — OPHTHALMOLOGY REPORT-SCANNED

## 2023-01-01 ENCOUNTER — Ambulatory Visit (INDEPENDENT_AMBULATORY_CARE_PROVIDER_SITE_OTHER): Payer: HMO | Admitting: Podiatry

## 2023-01-01 DIAGNOSIS — E1149 Type 2 diabetes mellitus with other diabetic neurological complication: Secondary | ICD-10-CM | POA: Diagnosis not present

## 2023-01-01 DIAGNOSIS — M79674 Pain in right toe(s): Secondary | ICD-10-CM | POA: Diagnosis not present

## 2023-01-01 DIAGNOSIS — M14671 Charcot's joint, right ankle and foot: Secondary | ICD-10-CM

## 2023-01-01 DIAGNOSIS — L539 Erythematous condition, unspecified: Secondary | ICD-10-CM | POA: Diagnosis not present

## 2023-01-01 DIAGNOSIS — B351 Tinea unguium: Secondary | ICD-10-CM

## 2023-01-01 DIAGNOSIS — L84 Corns and callosities: Secondary | ICD-10-CM

## 2023-01-01 NOTE — Progress Notes (Signed)
Subjective: Chief Complaint  Patient presents with   Diabetes    Nail trim  Concerned about a red place on her foot     67 y.o. returns the office today for painful, elongated, thickened toenails which she cannot trim herself on the right foot.   She also states that she has a red spot that she is concerned about on her right foot.  She been having swelling to her legs and she is on fluid pills.  Has been using compression socks as well.  PCP: Danelle Berry, PA-C Endocrinologist: Dr. Carlena Sax- last seen 12/14/2022  Objective: AAO 3, NAD DP/PT pulses palpable, CRT less than 3 seconds Left below-knee amputation Sensation absent with Semmes Weinstein monofilament Nails hypertrophic, dystrophic, elongated, brittle, discolored 5. There is tenderness overlying the nails 1-5 on the right. There is no surrounding erythema or drainage along the nail sites. Hyperkeratotic lesion on the sulcus of the right foot first interspace. Erythema area along bunion area, no skin breakdown.  No pain with calf compression, swelling, warmth, erythema.         Assessment: Patient presents with symptomatic onychomycosis; diabetic foot exam, Charcot right foot  Plan: -Treatment options including alternatives, risks, complications were discussed -Nails sharply debrided 5 without complication/bleeding. -There is a callus on the right foot and complications waiting.  Continue offloading. -Continue offloading for the bunion area.  She is behaving where the seam of the shoe is.  Nicki Guadalajara, our pedorthotist, added padding to help with this.  -Continue to monitor right foot daily.  Moisturizer daily. -Discussed daily foot inspection and glucose control. -Follow-up as scheduled or sooner if any problems are to arise. In the meantime, encouraged to call the office with any questions, concerns, changes symptoms.  Ovid Curd, DPM

## 2023-01-03 ENCOUNTER — Other Ambulatory Visit: Payer: Self-pay | Admitting: Internal Medicine

## 2023-01-03 DIAGNOSIS — I10 Essential (primary) hypertension: Secondary | ICD-10-CM

## 2023-01-04 NOTE — Telephone Encounter (Signed)
Requested medication (s) are due for refill today: YES  Requested medication (s) are on the active medication list: YES  Last refill:  01/24/22 #90  Future visit scheduled: YES  Notes to clinic:  PT has not had refill since November 2023. Called pt and made appt for med refill. Routing back since no recent refills.   Requested Prescriptions  Pending Prescriptions Disp Refills   hydrochlorothiazide (HYDRODIURIL) 25 MG tablet [Pharmacy Med Name: HYDROCHLOROTHIAZIDE 25MG  TABLET] 90 tablet 0    Sig: TAKE ONE TABLET BY MOUTH ONCE A DAY     Cardiovascular: Diuretics - Thiazide Passed - 01/03/2023  3:44 PM      Passed - Cr in normal range and within 180 days    Creat  Date Value Ref Range Status  04/19/2022 0.71 0.50 - 1.05 mg/dL Final   Creatinine, Ser  Date Value Ref Range Status  12/18/2022 0.78 0.44 - 1.00 mg/dL Final   Creatinine, POC  Date Value Ref Range Status  05/15/2016 NEG mg/dL Final   Creatinine, Urine  Date Value Ref Range Status  04/19/2022 48 20 - 275 mg/dL Final         Passed - K in normal range and within 180 days    Potassium  Date Value Ref Range Status  12/18/2022 3.9 3.5 - 5.1 mmol/L Final         Passed - Na in normal range and within 180 days    Sodium  Date Value Ref Range Status  12/18/2022 136 135 - 145 mmol/L Final  12/04/2018 137 134 - 144 mmol/L Final         Passed - Last BP in normal range    BP Readings from Last 1 Encounters:  12/18/22 111/89         Passed - Valid encounter within last 6 months    Recent Outpatient Visits           2 weeks ago Vertigo   [facility] Danelle Berry, PA-C   3 months ago Constipation, unspecified constipation type   [facility] Danelle Berry, PA-C   8 months ago Generalized edema   [facility] Danelle Berry, PA-C   11 months ago Essential hypertension, benign   [facility] Margarita Mail,  DO   1 year ago Rectal bleed   [facility] Berniece Salines, FNP       Future Appointments             In 1 week Danelle Berry, PA-C [facility]

## 2023-01-11 ENCOUNTER — Ambulatory Visit: Payer: HMO

## 2023-01-12 ENCOUNTER — Encounter: Payer: Self-pay | Admitting: Pharmacist

## 2023-01-12 DIAGNOSIS — T466X5A Adverse effect of antihyperlipidemic and antiarteriosclerotic drugs, initial encounter: Secondary | ICD-10-CM

## 2023-01-12 DIAGNOSIS — G720 Drug-induced myopathy: Secondary | ICD-10-CM

## 2023-01-12 NOTE — Progress Notes (Signed)
[facility] Quality Pharmacy Team Statin Quality Measure Assessment  01/12/2023  LASHELLE LOWENSTEIN 1955-10-28 [id number]  Per review of chart and payor information, Ms. Nakada has a diagnosis of diabetes but is not currently filling a statin prescription.  This places patient into the Statin Use In Patients with Diabetes (SUPD) measure for CMS.    Patient has documented trials of statins with reported myopathy and hepatotoxicity, but no corresponding CPT codes that would exclude patient from SUPD measure.  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  If this is still clinically appropriate, please consider evaluating and adding this diagnosis code to [facility] office visit.  Code for past statin intolerance or  other exclusions (required annually)  Provider Requirements: Associate code during an office visit or telehealth encounter  Drug Induced Myopathy G72.0   Myopathy, unspecified G72.9   Myositis, unspecified M60.9   Rhabdomyolysis M62.82   Cirrhosis of liver K74.69   Prediabetes R73.03   PCOS E28.2   Thank you for allowing [facility] pharmacy to be a part of this patient's care.  Dellie Burns, PharmD Clinical Pharmacist [place]  Direct Dial: [phone]

## 2023-01-15 ENCOUNTER — Ambulatory Visit (INDEPENDENT_AMBULATORY_CARE_PROVIDER_SITE_OTHER): Payer: HMO | Admitting: Family Medicine

## 2023-01-15 ENCOUNTER — Encounter: Payer: Self-pay | Admitting: Family Medicine

## 2023-01-15 VITALS — BP 134/68 | HR 93 | Temp 98.3°F | Resp 16 | Ht 60.0 in | Wt 183.9 lb

## 2023-01-15 DIAGNOSIS — G4733 Obstructive sleep apnea (adult) (pediatric): Secondary | ICD-10-CM

## 2023-01-15 DIAGNOSIS — I739 Peripheral vascular disease, unspecified: Secondary | ICD-10-CM

## 2023-01-15 DIAGNOSIS — I10 Essential (primary) hypertension: Secondary | ICD-10-CM

## 2023-01-15 DIAGNOSIS — K2100 Gastro-esophageal reflux disease with esophagitis, without bleeding: Secondary | ICD-10-CM | POA: Diagnosis not present

## 2023-01-15 DIAGNOSIS — E1121 Type 2 diabetes mellitus with diabetic nephropathy: Secondary | ICD-10-CM | POA: Diagnosis not present

## 2023-01-15 DIAGNOSIS — Z5181 Encounter for therapeutic drug level monitoring: Secondary | ICD-10-CM | POA: Diagnosis not present

## 2023-01-15 DIAGNOSIS — E785 Hyperlipidemia, unspecified: Secondary | ICD-10-CM | POA: Diagnosis not present

## 2023-01-15 DIAGNOSIS — E1140 Type 2 diabetes mellitus with diabetic neuropathy, unspecified: Secondary | ICD-10-CM | POA: Diagnosis not present

## 2023-01-15 DIAGNOSIS — I700 Atherosclerosis of aorta: Secondary | ICD-10-CM | POA: Diagnosis not present

## 2023-01-15 DIAGNOSIS — G720 Drug-induced myopathy: Secondary | ICD-10-CM | POA: Diagnosis not present

## 2023-01-15 DIAGNOSIS — J4520 Mild intermittent asthma, uncomplicated: Secondary | ICD-10-CM | POA: Diagnosis not present

## 2023-01-15 DIAGNOSIS — Z794 Long term (current) use of insulin: Secondary | ICD-10-CM

## 2023-01-15 DIAGNOSIS — Z780 Asymptomatic menopausal state: Secondary | ICD-10-CM

## 2023-01-15 DIAGNOSIS — E1142 Type 2 diabetes mellitus with diabetic polyneuropathy: Secondary | ICD-10-CM | POA: Diagnosis not present

## 2023-01-15 DIAGNOSIS — Z789 Other specified health status: Secondary | ICD-10-CM

## 2023-01-15 DIAGNOSIS — T466X5A Adverse effect of antihyperlipidemic and antiarteriosclerotic drugs, initial encounter: Secondary | ICD-10-CM

## 2023-01-15 DIAGNOSIS — Z1231 Encounter for screening mammogram for malignant neoplasm of breast: Secondary | ICD-10-CM

## 2023-01-15 MED ORDER — PANTOPRAZOLE SODIUM 40 MG PO TBEC
40.0000 mg | DELAYED_RELEASE_TABLET | Freq: Every day | ORAL | 1 refills | Status: AC
Start: 2023-01-15 — End: ?

## 2023-01-15 NOTE — Assessment & Plan Note (Signed)
Sx much better with taking PPI some days, she is not taking daily right now Encouraged her to use PPI as needed for a few weeks and then wean off if able with pepcid prn She previously saw GI at [facility], EGD last done April 2023 small hiatal hernia, prior to that at [facility]

## 2023-01-15 NOTE — Assessment & Plan Note (Signed)
Stable, well controlled, intermittent sx well controlled with albuterol inhaler

## 2023-01-15 NOTE — Assessment & Plan Note (Signed)
Neuropathy managed by neurology, on gabapentin

## 2023-01-15 NOTE — Assessment & Plan Note (Signed)
She is concerned about weight gain Multiple comorbidities including IDDM, HTN, HLD, PAD, OA Wt Readings from Last 5 Encounters:  01/15/23 183 lb 14.4 oz (83.4 kg)  12/18/22 180 lb 5.4 oz (81.8 kg)  12/18/22 180 lb 8 oz (81.9 kg)  10/27/22 186 lb (84.4 kg)  06/29/22 186 lb 6.4 oz (84.6 kg)   BMI Readings from Last 5 Encounters:  01/15/23 35.92 kg/m  12/18/22 35.22 kg/m  12/18/22 35.25 kg/m  10/27/22 36.33 kg/m  06/29/22 36.40 kg/m

## 2023-01-15 NOTE — Assessment & Plan Note (Signed)
Rash/itching rxn documented previously to lipitor and pravastatin On zetia and fenofibrate Overdue for lipids to be checked

## 2023-01-15 NOTE — Assessment & Plan Note (Signed)
On basal insulin, glimepiride and metformin she recently had to stop GLP-1 and present aside due to cost and then side effects Her diabetic foot exam and labs were done on 7/11 reviewed at length today

## 2023-01-15 NOTE — Assessment & Plan Note (Signed)
Noted previously on CT scan Unable to take statin, she is on zetia and tricor

## 2023-01-15 NOTE — Progress Notes (Signed)
Name: Olivia Romero   MRN: [id number]    DOB: [birth year]   Date:01/15/2023       Progress Note  Chief Complaint  Patient presents with   Follow-up   Hyperlipidemia   Hypertension   Gastroesophageal Reflux     Subjective:   Olivia Romero is a 67 y.o. female, presents to clinic for routine f/up  Hypertension:  Currently managed on hydrochlorothiazide  Pt reports good med compliance and denies any SE.   Blood pressure today is near goal. BP Readings from Last 3 Encounters:  01/15/23 134/68  12/18/22 111/89  12/18/22 132/78   Pt denies CP, SOB, exertional sx, LE edema, palpitation, Ha's, visual disturbances, lightheadedness, hypotension, syncope.  Right LE edema using lasix prn    DM - managed by [facility]ocrinology at [name] records, last OV and labs reviewed through care everywhere Last A1c 7.3 on 7/11 DM foot exam also done 7/11 RLE Plan: - Diabetes is reasonably controlled, however her 2 week blood glucose average is steadily increasing. Gycemic controlled had improved with Mounjaro yet she does not want to restart it. Discussed that [facility] may have caused headaches due to dehydration. She prefers not to take [facility] at this time. Suggested she consider oral semaglutide which is often better tolerates. She agreed and a prescription for Rybelsus 3 mg daily was sent.  - Continue Tresiba insulin, glimepiride, metformin as prescribed.  - Advised her to check blood glucose 4x daily. Bring FreeStyle Libre 3 CGM to each follow up visit. Reviewed use of the CGM today. - Continue annual eye exams. She is up to date on this.  - Continue fenofibrate, fish oil, and Zetia. No statin given intolerance.  - No ACE / ARB given her prior recurrent hyperkalemia. - Continue follow up with Podiatry, as planned.  - Follow up in 12 weeks or sooner if needed.   HLD hx of statin myopathy/myalgia, on zetia and tricor instead, labs due and last done about 2 years ago Lab Results  Component  Value Date   CHOL 194 01/21/2021   HDL 44 (L) 01/21/2021   LDLCALC 114 (H) 01/21/2021   TRIG 255 (H) 01/21/2021   CHOLHDL 4.4 01/21/2021   Not on allopurinol - it was previously rx'd by Dr. Lajoyce Corners  Lab Results  Component Value Date   LABURIC 5.4 04/22/2020   Asthma on albuterol inhaler prn - well controlled, uses intermittently  GERD on protonix symptoms intermittently, we was having frequent reflux and sometimes vomiting since starting PPI that has improved   Current Outpatient Medications:    acetaminophen (TYLENOL) 500 MG tablet, Take 1,000 mg by mouth every 6 (six) hours as needed for mild pain. Maximum of 3,000 mg per day, Disp: , Rfl:    albuterol (VENTOLIN HFA) 108 (90 Base) MCG/ACT inhaler, INHALE 2 PUFFS INTO THE LUNGS EVERY 4 HOURS AS NEEDED, Disp: 6.7 g, Rfl: 0   allopurinol (ZYLOPRIM) 100 MG tablet, Take 1 tablet (100 mg total) by mouth 2 (two) times daily., Disp: 60 tablet, Rfl: 3   aspirin EC 325 MG tablet, Take 1 tablet (325 mg total) by mouth daily., Disp: 30 tablet, Rfl: 0   Blood Glucose Monitoring Suppl (CONTOUR NEXT MONITOR) w/Device KIT, as directed., Disp: , Rfl:    Colchicine 0.6 MG CAPS, Take 0.6 mg by mouth 2 (two) times daily as needed., Disp: 60 capsule, Rfl: 1   DULoxetine (CYMBALTA) 60 MG capsule, Take 120 mg by mouth at bedtime., Disp: , Rfl:  ezetimibe (ZETIA) 10 MG tablet, Take 1 tablet (10 mg total) by mouth daily., Disp: 90 tablet, Rfl: 1   fenofibrate (TRICOR) 145 MG tablet, Take 1 tablet (145 mg total) by mouth daily., Disp: 90 tablet, Rfl: 1   furosemide (LASIX) 20 MG tablet, Take 0.5-1 tablets (10-20 mg total) by mouth daily as needed. For severe fluid retention in lower extremity.  Take in am with potassium PRN, Disp: 10 tablet, Rfl: 0   gabapentin (NEURONTIN) 100 MG capsule, Take 100 mg by mouth at bedtime., Disp: , Rfl:    gabapentin (NEURONTIN) 800 MG tablet, Take 800 mg by mouth 2 (two) times daily., Disp: , Rfl:    glimepiride (AMARYL) 2 MG  tablet, Take 2 mg by mouth 2 (two) times daily. , Disp: , Rfl:    hydrochlorothiazide (HYDRODIURIL) 25 MG tablet, TAKE ONE TABLET BY MOUTH ONCE A DAY, Disp: 90 tablet, Rfl: 0   Incontinence Supply Disposable (ASSURANCE FITTED BRIEF LARGE) MISC, Use 1 brief up to 6 times a day as needed for urinary incontinence, Disp: 180 each, Rfl: 5   insulin degludec (TRESIBA) 200 UNIT/ML FlexTouch Pen, Inject 20 Units into the skin at bedtime. , Disp: , Rfl:    meclizine (ANTIVERT) 25 MG tablet, Take 0.5-1 tablets (12.5-25 mg total) by mouth 3 (three) times daily as needed for dizziness (vertigo sx)., Disp: 30 tablet, Rfl: 1   mesalamine (LIALDA) 1.2 g EC tablet, Take 1.2 g by mouth daily., Disp: , Rfl:    metFORMIN (GLUCOPHAGE-XR) 500 MG 24 hr tablet, Take 500 mg by mouth 3 (three) times daily. , Disp: , Rfl:    Multiple Vitamins-Minerals (MULTIVITAMIN GUMMIES WOMENS PO), Take 1 tablet by mouth daily., Disp: , Rfl:    ondansetron (ZOFRAN-ODT) 4 MG disintegrating tablet, Take 1 tablet (4 mg total) by mouth every 8 (eight) hours as needed for refractory nausea / vomiting., Disp: 10 tablet, Rfl: 0   pantoprazole (PROTONIX) 40 MG tablet, Take 1 tablet (40 mg total) by mouth daily as needed (Heartburn). (Patient taking differently: Take 40 mg by mouth daily.), Disp: 90 tablet, Rfl: 2   polyethylene glycol (MIRALAX / GLYCOLAX) 17 g packet, Take 17 g by mouth every other day., Disp: , Rfl:    potassium chloride SA (KLOR-CON M) 20 MEQ tablet, Take 1 tablet (20 mEq total) by mouth daily as needed (take on days when taking lasix/furosemide)., Disp: 20 tablet, Rfl: 0   ULTICARE SHORT PEN NEEDLES 31G X 8 MM MISC, , Disp: , Rfl:    Semaglutide,0.25 or 0.5MG /DOS, 2 MG/3ML SOPN, Inject 0.25 mg into the skin once a week. For 4 weeks (Patient not taking: Reported on 12/18/2022), Disp: 3 mL, Rfl: 0  Patient Active Problem List   Diagnosis Date Noted   Aortic atherosclerosis (HCC) 01/15/2023   History of diastolic dysfunction  04/18/2022   Mitral valve insufficiency 04/18/2022   Statin myopathy 11/23/2021   History of colitis 08/03/2021   Vitamin D deficiency 07/12/2020   Chronic low back pain 07/12/2020   Sepsis without acute organ dysfunction (HCC) 12/29/2019   Cervical disc disorder 12/29/2019   Brachial radiculitis 12/29/2019   Allergy to statin medication 12/01/2019   History of below knee amputation, left (HCC) 07/30/2019   PAD (peripheral artery disease) (HCC) 12/05/2018   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) 09/18/2018   Morbid obesity due to excess calories (HCC) 11/01/2017   B12 deficiency 11/01/2017   Diabetic polyneuropathy associated with type 2 diabetes mellitus (HCC) 12/13/2015  Lumbar stenosis with neurogenic claudication 09/02/2015   Chronic constipation 07/13/2015   OSA (obstructive sleep apnea) 06/25/2015   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/23/2015   Hyperlipidemia 04/23/2015   Statin intolerance 04/23/2015   Asthma, mild intermittent 02/01/2015   Type 2 diabetes mellitus with diabetic neuropathy, with long-term current use of insulin (HCC) 12/31/2014   Gastroesophageal reflux disease with esophagitis 12/31/2014   Bilateral carpal tunnel syndrome 12/01/2014   Diverticulosis of colon 11/30/2012   Benign essential hypertension 11/28/2012    Past Surgical History:  Procedure Laterality Date   ABDOMINAL HYSTERECTOMY     AMPUTATION Left 12/06/2018   Procedure: PARTIAL AMPUTATION LEFT FOOT;  Surgeon: Felecia Shelling, DPM;  Location: [facility] OR;  Service: Podiatry;  Laterality: Left;   AMPUTATION Left 05/16/2019   Procedure: LEFT BELOW KNEE AMPUTATION;  Surgeon: Nadara Mustard, MD;  Location: [facility] OR;  Service: Orthopedics;  Laterality: Left;   APPENDECTOMY     removed in 11th grade   BACK SURGERY  09/02/2015   BONE BIOPSY Left 12/06/2018   Procedure: Bone Biopsy;  Surgeon: Felecia Shelling, DPM;  Location: [facility] OR;  Service: Podiatry;  Laterality: Left;   CERVICAL  FUSION     CESAREAN SECTION  1986   COLONOSCOPY N/A 11/30/2012   Procedure: COLONOSCOPY;  Surgeon: Hilarie Fredrickson, MD;  Location: [facility] ENDOSCOPY;  Service: Endoscopy;  Laterality: N/A;   COLONOSCOPY N/A 09/05/2021   Procedure: COLONOSCOPY;  Surgeon: Regis Bill, MD;  [facility] ENDOSCOPY;  Service: Endoscopy;  Laterality: N/A;   COLONOSCOPY WITH PROPOFOL N/A 01/03/2016   Procedure: COLONOSCOPY WITH PROPOFOL;  Surgeon: Scot Jun, MD;  Location: [facility] ENDOSCOPY;  Service: Endoscopy;  Laterality: N/A;   ESOPHAGOGASTRODUODENOSCOPY N/A 09/05/2021   Procedure: ESOPHAGOGASTRODUODENOSCOPY (EGD);  Surgeon: Regis Bill, MD;  Location: [facility] ENDOSCOPY;  Service: Endoscopy;  Laterality: N/A;   ESOPHAGOGASTRODUODENOSCOPY (EGD) WITH PROPOFOL N/A 01/14/2015   Procedure: ESOPHAGOGASTRODUODENOSCOPY (EGD) WITH PROPOFOL;  Surgeon: Scot Jun, MD;  Location: [facility] ENDOSCOPY;  Service: Endoscopy;  Laterality: N/A;   IRRIGATION AND DEBRIDEMENT FOOT Left 03/13/2018   Procedure: IRRIGATION AND DEBRIDEMENT FOOT WITH BONE BIOPSY WITH MISONIX DEBRIDER;  Surgeon: Park Liter, DPM;  Location: [facility] OR;  Service: Podiatry;  Laterality: Left;   IRRIGATION AND DEBRIDEMENT FOOT Left 12/06/2018   Procedure: Irrigation And Debridement Foot;  Surgeon: Felecia Shelling, DPM;  Location: [facility] OR;  Service: Podiatry;  Laterality: Left;   LUMBAR WOUND DEBRIDEMENT N/A 10/01/2015   Procedure: LUMBAR WOUND DEBRIDEMENT;  Surgeon: Coletta Memos, MD;  Location: [facility] NEURO ORS;  Service: Neurosurgery;  Laterality: N/A;  LUMBAR WOUND DEBRIDEMENT   NASAL SINUS SURGERY     SAVORY DILATION N/A 01/14/2015   Procedure: SAVORY DILATION;  Surgeon: Scot Jun, MD;  Location: [facility] ENDOSCOPY;  Service: Endoscopy;  Laterality: N/A;   TONSILLECTOMY     WISDOM TOOTH EXTRACTION      Family History  Problem Relation Age of Onset   Lung cancer Father    Hypertension Father    Arthritis Father    Other Mother         hardening of the arteries/renal cell carcenoma   Hypertension Mother    Stroke Mother    Kidney cancer Mother    Arthritis Brother    Rheum arthritis Maternal Uncle    Bladder Cancer Neg Hx     Social History   Tobacco Use   Smoking status: Never   Smokeless tobacco: Never  Vaping Use  Vaping status: Never Used  Substance Use Topics   Alcohol use: Never   Drug use: No     Allergies  Allergen Reactions   Egg-Derived Products Swelling and Other (See Comments)    Angioedema   Atorvastatin Other (See Comments)    Liver toxicity   Influenza Virus Vaccine Swelling    Arm swelled (site of injection)   Latex Itching   Pravastatin Itching and Rash   Tape Rash and Other (See Comments)    Adhesive on foot pull off skin.  Ok to use paper tape.    Health Maintenance  Topic Date Due   FOOT EXAM  06/07/2022   MAMMOGRAM  11/04/2022   DEXA SCAN  Never done   Zoster Vaccines- Shingrix (1 of 2) 03/20/2023 (Originally 11/18/2007)   OPHTHALMOLOGY EXAM  02/25/2023   Diabetic kidney evaluation - Urine ACR  04/20/2023   HEMOGLOBIN A1C  06/16/2023   Medicare Annual Wellness (AWV)  10/27/2023   Diabetic kidney evaluation - eGFR measurement  12/18/2023   DTaP/Tdap/Td (2 - Td or Tdap) 07/12/2025   Colonoscopy  09/08/2026   Pneumonia Vaccine 8+ Years old  Completed   Hepatitis C Screening  Completed   HIV Screening  Completed   HPV VACCINES  Aged Out   INFLUENZA VACCINE  Discontinued   PAP SMEAR-Modifier  Discontinued   COVID-19 Vaccine  Discontinued    Chart Review Today: I personally reviewed active problem list, medication list, allergies, family history, social history, health maintenance, notes from last encounter, lab results, imaging with the patient/caregiver today.   Review of Systems  Constitutional: Negative.   HENT: Negative.    Eyes: Negative.   Respiratory: Negative.    Cardiovascular: Negative.   Gastrointestinal: Negative.   Endocrine: Negative.    Genitourinary: Negative.   Musculoskeletal: Negative.   Skin: Negative.   Allergic/Immunologic: Negative.   Neurological: Negative.   Hematological: Negative.   Psychiatric/Behavioral: Negative.    All other systems reviewed and are negative.    Objective:   Vitals:   01/15/23 1417  BP: 134/68  Pulse: 93  Resp: 16  Temp: 98.3 F (36.8 C)  TempSrc: Oral  SpO2: 98%  Weight: 183 lb 14.4 oz (83.4 kg)  Height: 5' (1.524 m)    Body mass index is 35.92 kg/m.  Physical Exam Vitals and nursing note reviewed.  Constitutional:      General: She is not in acute distress.    Appearance: Normal appearance. She is well-developed. She is obese. She is not ill-appearing, toxic-appearing or diaphoretic.  HENT:     Head: Normocephalic and atraumatic.     Right Ear: External ear normal.     Left Ear: External ear normal.     Nose: Nose normal.  Eyes:     General:        Right eye: No discharge.        Left eye: No discharge.     Conjunctiva/sclera: Conjunctivae normal.  Neck:     Trachea: No tracheal deviation.  Cardiovascular:     Rate and Rhythm: Normal rate and regular rhythm.     Pulses: Normal pulses.     Heart sounds: Normal heart sounds.  Pulmonary:     Effort: Pulmonary effort is normal. No respiratory distress.     Breath sounds: Normal breath sounds. No stridor. No wheezing, rhonchi or rales.  Musculoskeletal:     Comments: Left AKA, right LE in brace and sleeve  Skin:    General: Skin is  warm and dry.     Findings: No rash.  Neurological:     Mental Status: She is alert.     Motor: No abnormal muscle tone.     Coordination: Coordination normal.  Psychiatric:        Attention and Perception: Attention normal.        Mood and Affect: Mood normal.        Speech: Speech normal.        Behavior: Behavior normal. Behavior is cooperative.        Cognition and Memory: Memory is impaired.         Assessment & Plan:   Problem List Items Addressed This Visit        Cardiovascular and Mediastinum   Benign essential hypertension - Primary    Pt BP today slightly above goal on hydrochlorothiazide, cannot take ACEI/ARB due to prior hyperkalemia (per endo) BP Readings from Last 3 Encounters:  01/15/23 134/68  12/18/22 111/89  12/18/22 132/78         Relevant Orders   COMPLETE METABOLIC PANEL WITH GFR   PAD (peripheral artery disease) (HCC)   Relevant Orders   COMPLETE METABOLIC PANEL WITH GFR   Lipid panel   Aortic atherosclerosis (HCC)    Noted previously on CT scan Unable to take statin, she is on zetia and tricor        Respiratory   Asthma, mild intermittent    Stable, well controlled, intermittent sx well controlled with albuterol inhaler      Relevant Orders   CBC with Differential/Platelet   OSA (obstructive sleep apnea)   Relevant Orders   CBC with Differential/Platelet     Digestive   Gastroesophageal reflux disease with esophagitis    Sx much better with taking PPI some days, she is not taking daily right now Encouraged her to use PPI as needed for a few weeks and then wean off if able with pepcid prn She previously saw GI at [facility], EGD last done April 2023 small hiatal hernia, prior to that at [facility]      Relevant Medications   pantoprazole (PROTONIX) 40 MG tablet     Endocrine   Type 2 diabetes mellitus with diabetic neuropathy, with long-term current use of insulin (HCC)    On basal insulin, glimepiride and metformin she recently had to stop GLP-1 and present aside due to cost and then side effects Her diabetic foot exam and labs were done on 7/11 reviewed at length today      Diabetic polyneuropathy associated with type 2 diabetes mellitus (HCC)    Neuropathy managed by neurology, on gabapentin        Musculoskeletal and Integument   Statin myopathy    Documented rash/itching to pravastatin and atorvastatin, no myopathy, elevated LFTs or elevated CK documented - reviewed all labs and  records in EMR and scanned into EMR Would possibly rechallenge with crestor         Other   Hyperlipidemia    Rash/itching rxn documented previously to lipitor and pravastatin On zetia and fenofibrate Overdue for lipids to be checked      Relevant Orders   COMPLETE METABOLIC PANEL WITH GFR   Lipid panel   Class 2 severe obesity with serious comorbidity and body mass index (BMI) of 35.0 to 35.9 in adult [facility])    She is concerned about weight gain Multiple comorbidities including IDDM, HTN, HLD, PAD, OA Wt Readings from Last 5 Encounters:  01/15/23  183 lb 14.4 oz (83.4 kg)  12/18/22 180 lb 5.4 oz (81.8 kg)  12/18/22 180 lb 8 oz (81.9 kg)  10/27/22 186 lb (84.4 kg)  06/29/22 186 lb 6.4 oz (84.6 kg)   BMI Readings from Last 5 Encounters:  01/15/23 35.92 kg/m  12/18/22 35.22 kg/m  12/18/22 35.25 kg/m  10/27/22 36.33 kg/m  06/29/22 36.40 kg/m         Other Visit Diagnoses     Encounter for medication monitoring       Relevant Orders   COMPLETE METABOLIC PANEL WITH GFR   Lipid panel   CBC with Differential/Platelet   Type 2 diabetes mellitus with diabetic nephropathy, with long-term current use of insulin (HCC)       Encounter for screening mammogram for malignant neoplasm of breast       Relevant Orders   MM 3D SCREENING MAMMOGRAM BILATERAL BREAST   Postmenopausal estrogen deficiency       Relevant Orders   DG Bone Density   Statin not tolerated       allergy? intolerance vs hepatotoxicity/myooathy - sounds like its in combo with other meds        Return in about 6 months (around 07/18/2023) for Routine follow-up.   Danelle Berry, PA-C 01/15/23 2:41 PM

## 2023-01-15 NOTE — Assessment & Plan Note (Signed)
Pt BP today slightly above goal on hydrochlorothiazide, cannot take ACEI/ARB due to prior hyperkalemia (per endo) BP Readings from Last 3 Encounters:  01/15/23 134/68  12/18/22 111/89  12/18/22 132/78

## 2023-01-15 NOTE — Assessment & Plan Note (Signed)
Documented rash/itching to pravastatin and atorvastatin, no myopathy, elevated LFTs or elevated CK documented - reviewed all labs and records in EMR and scanned into EMR Would possibly rechallenge with crestor

## 2023-01-17 ENCOUNTER — Ambulatory Visit: Payer: HMO | Admitting: Family Medicine

## 2023-01-18 ENCOUNTER — Ambulatory Visit: Payer: HMO

## 2023-01-22 ENCOUNTER — Other Ambulatory Visit: Payer: Self-pay | Admitting: Family Medicine

## 2023-01-22 DIAGNOSIS — I10 Essential (primary) hypertension: Secondary | ICD-10-CM

## 2023-01-22 DIAGNOSIS — E785 Hyperlipidemia, unspecified: Secondary | ICD-10-CM

## 2023-01-22 DIAGNOSIS — E781 Pure hyperglyceridemia: Secondary | ICD-10-CM

## 2023-01-22 MED ORDER — EZETIMIBE 10 MG PO TABS
10.0000 mg | ORAL_TABLET | Freq: Every day | ORAL | 1 refills | Status: DC
Start: 2023-01-22 — End: 2023-12-20

## 2023-01-22 MED ORDER — FENOFIBRATE 145 MG PO TABS
145.0000 mg | ORAL_TABLET | Freq: Every day | ORAL | 1 refills | Status: DC
Start: 2023-01-22 — End: 2023-12-20

## 2023-01-22 MED ORDER — HYDROCHLOROTHIAZIDE 25 MG PO TABS
25.0000 mg | ORAL_TABLET | Freq: Every day | ORAL | 0 refills | Status: DC
Start: 2023-01-22 — End: 2023-08-06

## 2023-01-25 ENCOUNTER — Ambulatory Visit: Payer: HMO | Attending: Family Medicine

## 2023-01-25 DIAGNOSIS — R2689 Other abnormalities of gait and mobility: Secondary | ICD-10-CM

## 2023-01-25 DIAGNOSIS — R2681 Unsteadiness on feet: Secondary | ICD-10-CM | POA: Diagnosis not present

## 2023-01-25 DIAGNOSIS — R42 Dizziness and giddiness: Secondary | ICD-10-CM

## 2023-01-25 NOTE — Therapy (Signed)
OUTPATIENT PHYSICAL THERAPY VESTIBULAR TREATMENT NOTE   Patient Name: Olivia Romero MRN: [id number] DOB:13-Sep-1955, 67 y.o., female Today's Date: 01/25/2023  PCP: Danelle Berry, PA-C  REFERRING PROVIDER: Danelle Berry, PA-C   END OF SESSION:  PT End of Session - 01/25/23 0925     Visit Number 2    Number of Visits 17    Date for PT Re-Evaluation 02/23/23    Authorization Type HTA (VL: Based on MN)    Progress Note Due on Visit 10    PT Start Time 0928    PT Stop Time 1015    PT Time Calculation (min) 47 min    Equipment Utilized During Treatment Gait belt    Activity Tolerance Other (comment)   Limited secondary to dizziness   Behavior During Therapy [facility] for tasks assessed/performed             Past Medical History:  Diagnosis Date   Anemia    Arthritis    Asthma    Colon polyps    adenomatous   Diabetes mellitus without complication (HCC)    Diabetic infection of left foot (HCC) 12/2018   Diverticulosis of colon    Dysphagia 12/31/2014   Esophagitis    GERD (gastroesophageal reflux disease)    Hemorrhoid    internal   Hyperlipemia    Hypertension    IBS (irritable bowel syndrome)    no current prob - diet controlled   MRSA bacteremia 12/05/2018   Myalgia due to statin 11/19/2017   Neuropathy    Neuropathy of both feet    Neuropathy of hand    Pneumonia    x 4   PONV (postoperative nausea and vomiting)    on some surgeries but not all procedures   Skin ulcer of right ankle, limited to breakdown of skin (HCC) 12/31/2016   resolved per patient 05/14/19   Sleep apnea    has had in the past lost 50 pounds and do longer uses cpap   Statin intolerance 04/23/2015   Stroke (HCC) 2009   mini stroke per patient   Stroke-like episode 2009   TIA - mini stroke per patient   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 07/13/2015   Uncontrolled type 2 diabetes mellitus with hyperglycemia (HCC) 11/01/2017   Past Surgical History:  Procedure  Laterality Date   ABDOMINAL HYSTERECTOMY     AMPUTATION Left 12/06/2018   Procedure: PARTIAL AMPUTATION LEFT FOOT;  Surgeon: Felecia Shelling, DPM;  [facility]: [facility] OR;  Service: Podiatry;  Laterality: Left;   AMPUTATION Left 05/16/2019   Procedure: LEFT BELOW KNEE AMPUTATION;  Surgeon: Nadara Mustard, MD;  Location: [facility] OR;  Service: Orthopedics;  Laterality: Left;   APPENDECTOMY     removed in 11th grade   BACK SURGERY  09/02/2015   BONE BIOPSY Left 12/06/2018   Procedure: Bone Biopsy;  Surgeon: Felecia Shelling, DPM;  Location: [facility] OR;  Service: Podiatry;  Laterality: Left;   CERVICAL FUSION     CESAREAN SECTION  1986   COLONOSCOPY N/A 11/30/2012   Procedure: COLONOSCOPY;  Surgeon: Hilarie Fredrickson, MD;  Location: [facility] ENDOSCOPY;  Service: Endoscopy;  Laterality: N/A;   COLONOSCOPY N/A 09/05/2021   Procedure: COLONOSCOPY;  Surgeon: Regis Bill, MD;  Location: [facility] ENDOSCOPY;  Service: Endoscopy;  Laterality: N/A;   COLONOSCOPY WITH PROPOFOL N/A 01/03/2016   Procedure: COLONOSCOPY WITH PROPOFOL;  Surgeon: Scot Jun, MD;  Location: [facility] ENDOSCOPY;  Service: Endoscopy;  Laterality: N/A;  ESOPHAGOGASTRODUODENOSCOPY N/A 09/05/2021   Procedure: ESOPHAGOGASTRODUODENOSCOPY (EGD);  Surgeon: Regis Bill, MD;  Location: [facility] ENDOSCOPY;  Service: Endoscopy;  Laterality: N/A;   ESOPHAGOGASTRODUODENOSCOPY (EGD) WITH PROPOFOL N/A 01/14/2015   Procedure: ESOPHAGOGASTRODUODENOSCOPY (EGD) WITH PROPOFOL;  Surgeon: Scot Jun, MD;  Location: [facility] ENDOSCOPY;  Service: Endoscopy;  Laterality: N/A;   IRRIGATION AND DEBRIDEMENT FOOT Left 03/13/2018   Procedure: IRRIGATION AND DEBRIDEMENT FOOT WITH BONE BIOPSY WITH MISONIX DEBRIDER;  Surgeon: Park Liter, DPM;  [facility]: [facility] OR;  Service: Podiatry;  Laterality: Left;   IRRIGATION AND DEBRIDEMENT FOOT Left 12/06/2018   Procedure: Irrigation And Debridement Foot;  Surgeon: Felecia Shelling, DPM;  Location: [facility] OR;  Service: Podiatry;  Laterality:  Left;   LUMBAR WOUND DEBRIDEMENT N/A 10/01/2015   Procedure: LUMBAR WOUND DEBRIDEMENT;  Surgeon: Coletta Memos, MD;  Location: [facility] NEURO ORS;  Service: Neurosurgery;  Laterality: N/A;  LUMBAR WOUND DEBRIDEMENT   NASAL SINUS SURGERY     SAVORY DILATION N/A 01/14/2015   Procedure: SAVORY DILATION;  Surgeon: Scot Jun, MD;  Location: [facility] ENDOSCOPY;  Service: Endoscopy;  Laterality: N/A;   TONSILLECTOMY     WISDOM TOOTH EXTRACTION     Patient Active Problem List   Diagnosis Date Noted   Aortic atherosclerosis (HCC) 01/15/2023   History of diastolic dysfunction 04/18/2022   Mitral valve insufficiency 04/18/2022   Statin myopathy 11/23/2021   History of colitis 08/03/2021   Vitamin D deficiency 07/12/2020   Chronic low back pain 07/12/2020   Sepsis without acute organ dysfunction (HCC) 12/29/2019   Cervical disc disorder 12/29/2019   Brachial radiculitis 12/29/2019   Allergy to statin medication 12/01/2019   History of below knee amputation, left (HCC) 07/30/2019   PAD (peripheral artery disease) (HCC) 12/05/2018   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) 09/18/2018   Class 2 severe obesity with serious comorbidity and body mass index (BMI) of 35.0 to 35.9 in adult (HCC) 11/01/2017   B12 deficiency 11/01/2017   Diabetic polyneuropathy associated with type 2 diabetes mellitus (HCC) 12/13/2015   Lumbar stenosis with neurogenic claudication 09/02/2015   Chronic constipation 07/13/2015   OSA (obstructive sleep apnea) 06/25/2015   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/23/2015   Hyperlipidemia 04/23/2015   Statin intolerance 04/23/2015   Asthma, mild intermittent 02/01/2015   Type 2 diabetes mellitus with diabetic neuropathy, with long-term current use of insulin (HCC) 12/31/2014   Gastroesophageal reflux disease with esophagitis 12/31/2014   Bilateral carpal tunnel syndrome 12/01/2014   Diverticulosis of colon 11/30/2012   Benign essential hypertension 11/28/2012     ONSET DATE: 12/19/2022  REFERRING DIAG: R42 (ICD-10-CM) - Vertigo   THERAPY DIAG:  Dizziness and giddiness  Unsteadiness on feet  Other abnormalities of gait and mobility  Rationale for Evaluation and Treatment: Rehabilitation  PERTINENT HISTORY: HTN, HLD, Diabetes Type 2, Asthma, GERD, Neuropathy, Stroke, L BKA, Mitral Valve Insufficency, Anemia, Low Back Pain, Cervical Disc Disorder, Charcot Foot, PAD, OA   PRECAUTIONS: Fall  SUBJECTIVE: Patient reports dizziness has been feeling better. Nausea has completely resolved. Patient reports still motion sensitive to quick head movements. Reports she saw opthalmology, no findings.   PAIN:  Are you having pain? No   OBJECTIVE:  OTION SENSITIVITY:            Motion Sensitivity Quotient Intensity: 0 = none, 1 = Lightheaded, 2 = Mild, 3 = Moderate, 4 = Severe, 5 = Vomiting   Intensity  1. Sitting to supine 0  2. Supine to  L side 1  3. Supine to R side 1  4. Supine to sitting 3  5. L Hallpike-Dix (Sidelying) 0  6. Up from L  3  7. R Hallpike-Dix (Sidelying) 0  8. Up from R  3  9. Sitting, head tipped to L knee 0  10. Head up from L knee 2  11. Sitting, head tipped to R knee 0  12. Head up from R knee 2  13. Sitting head turns x5 3  14.Sitting head nods x5 3  15. In stance, 180 turn to L     16. In stance, 180 turn to R      BOLDED Items completed on 01/25/23.   ORTHOSTATICS:  Supine: 135/58, HR: 95 Seated: 132/57, HR: 84 Standing: 135/65, HR: 95  VESTIBULAR TREATMENT:    Gaze Adaptation: x1 Viewing Horizontal: Position: seated, Time: 30 seconds, Reps: 2, and Comment: mild dizziness, cues for technique  Habituation: Seated Vertical Head Movements: comment: completed x 4-5 reps with moderate to severe dizziness, increased challenge with vertical > horizontal and Seated Horizontal Head Movements: comment: ompleted x  5 reps with mild dizziness, cues for range   Smooth pursuits: completed vertical and horizontal  smooth pursuits x 5-6 reps with moderate dizziness. Difficulty maintain eyes focused at times with saccades. Intermittent rest breaks required.   Established following HEP and reviewed during session:  Access Code: [email or web address] URL: [email or web address] Date: 01/25/2023 Prepared by: Marlowe Kays  Program Notes Monitor dizziness and symptoms, they will increase with these exercises but if they get to to 8/10 or above. Please stop and allow for symptoms to resolve.   Exercises - Seated Horizontal Smooth Pursuit  - 2 x daily - 5 x weekly - 1 reps - 30 seconds hold - Seated Vertical Smooth Pursuit  - 2 x daily - 5 x weekly - 1 reps - 30 seconds hold - Seated Head Turns Vestibular Habituation  - 2 x daily - 5 x weekly - 5 reps - Seated Head Nod  - 2 x daily - 5 x weekly - 5 reps - Seated Gaze Stabilization with Head Rotation  - 2 x daily - 5 x weekly - 1 sets - 30 seconds hold   PATIENT EDUCATION: Education details:  Person educated: Patient Education method: Explanation Education comprehension: verbalized understanding   HOME EXERCISE PROGRAM: Access Code: [email or web address]   GOALS: Goals reviewed with patient? Yes   SHORT TERM GOALS: Target date: 01/26/2023   Pt will be independent with initial vestibular/balance HEP for improved vestibular input and reduced motion sensitivity  Baseline: no HEP established; reports independence Goal status: MET   2.  Pt will report </= 3/5 for all movements on MSQ to indicate improvement in motion sensitivity and improved activity tolerance.  Baseline: 4/5; 2-3/5 Goal status: MET     LONG TERM GOALS: Target date: 02/23/2023   Pt will be independent with final vestibular/balance HEP for improved vestibular input and reduced motion sensitivity  Baseline: no HEP established Goal status: INITIAL   2.  Pt will report </= 2/5 for all movements on MSQ to indicate improvement in motion sensitivity and improved activity tolerance.  Baseline:  4/5 Goal status: INITIAL   3.  Pt will be able to complete gaze stabilization for >/= 30 seconds with less than </= 5/10 dizziness.  Baseline: significant/severe dizziness with VOR Goal status: INITIAL   4.  Pt will report 50% improvement in dizziness with functional activities including bed mobility and head turns Baseline: severe  dizziness Goal status: INITIAL     ASSESSMENT:   CLINICAL IMPRESSION: Today's skilled PT session included assessment of patient's progress toward STGs. Patient able to meet STG this date. Patient with improvements in dizziness and noticeable improvements with motion sensitivity intensity. Patient continue to have increased sensitivity to forward bend, vertical head movement and oculomotor movements Rest of session spent establishing initial HEP focused on oculomotor and habituation exercises. Patient tolerating well. Patient is making steady progress with therapy and will continue to benefit from skilled PT services to progress toward LTGs.        OBJECTIVE IMPAIRMENTS: Abnormal gait, decreased activity tolerance, decreased balance, decreased mobility, difficulty walking, decreased ROM, dizziness, impaired sensation, and pain.    ACTIVITY LIMITATIONS: bending, standing, transfers, bed mobility, and locomotion level   PARTICIPATION LIMITATIONS: cleaning and community activity   PERSONAL FACTORS: Age and 3+ comorbidities: HTN, HLD, Diabetes Type 2, Asthma, GERD, Neuropathy, Stroke, L BKA, Mitral Valve Insufficency, Anemia, Low Back Pain, Cervical Disc Disorder, Charcot Foot, PAD, OA  are also affecting patient's functional outcome.    REHAB POTENTIAL: Good   CLINICAL DECISION MAKING: Evolving/moderate complexity   EVALUATION COMPLEXITY: Moderate     PLAN:   PT FREQUENCY: 2x/week   PT DURATION: 8 weeks   PLANNED INTERVENTIONS: Therapeutic exercises, Therapeutic activity, Neuromuscular re-education, Balance training, Gait training, Patient/Family  education, Self Care, Joint mobilization, Stair training, Vestibular training, Canalith repositioning, Orthotic/Fit training, DME instructions, Moist heat, and Manual therapy   PLAN FOR NEXT SESSION: Reassess BPPV testing if warranted. Continue to progress VOR and Smooth Pursuits/Saccades, and Habituation.    Creed Copper Fairly, PT, DPT 01/25/23 10:22 AM

## 2023-01-29 DIAGNOSIS — E1165 Type 2 diabetes mellitus with hyperglycemia: Secondary | ICD-10-CM | POA: Diagnosis not present

## 2023-02-06 ENCOUNTER — Ambulatory Visit: Payer: HMO | Attending: Family Medicine

## 2023-02-06 DIAGNOSIS — R2689 Other abnormalities of gait and mobility: Secondary | ICD-10-CM | POA: Insufficient documentation

## 2023-02-06 DIAGNOSIS — R42 Dizziness and giddiness: Secondary | ICD-10-CM | POA: Insufficient documentation

## 2023-02-06 DIAGNOSIS — R2681 Unsteadiness on feet: Secondary | ICD-10-CM | POA: Insufficient documentation

## 2023-02-08 ENCOUNTER — Ambulatory Visit: Payer: HMO

## 2023-02-13 ENCOUNTER — Ambulatory Visit: Payer: HMO

## 2023-02-13 DIAGNOSIS — R42 Dizziness and giddiness: Secondary | ICD-10-CM

## 2023-02-13 DIAGNOSIS — R2689 Other abnormalities of gait and mobility: Secondary | ICD-10-CM | POA: Diagnosis not present

## 2023-02-13 DIAGNOSIS — R2681 Unsteadiness on feet: Secondary | ICD-10-CM | POA: Diagnosis not present

## 2023-02-13 NOTE — Therapy (Signed)
OUTPATIENT PHYSICAL THERAPY TREATMENT    Patient Name: Olivia Romero MRN: [id number] DOB:February 10, 1956, 67 y.o., female Today's Date: 02/13/2023  PCP: Danelle Berry, PA-C  REFERRING PROVIDER: Danelle Berry, PA-C   END OF SESSION:  PT End of Session - 02/13/23 1049     Visit Number 3    Number of Visits 17    Date for PT Re-Evaluation 02/23/23    Authorization Type HTA (VL: Based on MN)    Progress Note Due on Visit 10    PT Start Time 1044    PT Stop Time 1059    PT Time Calculation (min) 15 min    Activity Tolerance Patient tolerated treatment well;No increased pain    Behavior During Therapy WFL for tasks assessed/performed             Past Medical History:  Diagnosis Date   Anemia    Arthritis    Asthma    Colon polyps    adenomatous   Diabetes mellitus without complication (HCC)    Diabetic infection of left foot (HCC) 12/2018   Diverticulosis of colon    Dysphagia 12/31/2014   Esophagitis    GERD (gastroesophageal reflux disease)    Hemorrhoid    internal   Hyperlipemia    Hypertension    IBS (irritable bowel syndrome)    no current prob - diet controlled   MRSA bacteremia 12/05/2018   Myalgia due to statin 11/19/2017   Neuropathy    Neuropathy of both feet    Neuropathy of hand    Pneumonia    x 4   PONV (postoperative nausea and vomiting)    on some surgeries but not all procedures   Skin ulcer of right ankle, limited to breakdown of skin (HCC) 12/31/2016   resolved per patient 05/14/19   Sleep apnea    has had in the past lost 50 pounds and do longer uses cpap   Statin intolerance 04/23/2015   Stroke (HCC) 2009   mini stroke per patient   Stroke-like episode 2009   TIA - mini stroke per patient   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 07/13/2015   Uncontrolled type 2 diabetes mellitus with hyperglycemia (HCC) 11/01/2017   Past Surgical History:  Procedure Laterality Date   ABDOMINAL HYSTERECTOMY     AMPUTATION Left  12/06/2018   Procedure: PARTIAL AMPUTATION LEFT FOOT;  Surgeon: Felecia Shelling, DPM;  Location: [facility] OR;  Service: Podiatry;  Laterality: Left;   AMPUTATION Left 05/16/2019   Procedure: LEFT BELOW KNEE AMPUTATION;  Surgeon: Nadara Mustard, MD;  Location: [facility] OR;  Service: Orthopedics;  Laterality: Left;   APPENDECTOMY     removed in 11th grade   BACK SURGERY  09/02/2015   BONE BIOPSY Left 12/06/2018   Procedure: Bone Biopsy;  Surgeon: Felecia Shelling, DPM;  Location: [facility] OR;  Service: Podiatry;  Laterality: Left;   CERVICAL FUSION     CESAREAN SECTION  1986   COLONOSCOPY N/A 11/30/2012   Procedure: COLONOSCOPY;  Surgeon: Hilarie Fredrickson, MD;  Location: [facility] ENDOSCOPY;  Service: Endoscopy;  Laterality: N/A;   COLONOSCOPY N/A 09/05/2021   Procedure: COLONOSCOPY;  Surgeon: Regis Bill, MD;  Location: [facility] ENDOSCOPY;  Service: Endoscopy;  Laterality: N/A;   COLONOSCOPY WITH PROPOFOL N/A 01/03/2016   Procedure: COLONOSCOPY WITH PROPOFOL;  Surgeon: Scot Jun, MD;  Location[facility] ENDOSCOPY;  Service: Endoscopy;  Laterality: N/A;   ESOPHAGOGASTRODUODENOSCOPY N/A 09/05/2021   Procedure: ESOPHAGOGASTRODUODENOSCOPY (EGD);  Surgeon: Mia Creek,  Rossie Muskrat, MD;  Location: [facility] ENDOSCOPY;  Service: Endoscopy;  Laterality: N/A;   ESOPHAGOGASTRODUODENOSCOPY (EGD) WITH PROPOFOL N/A 01/14/2015   Procedure: ESOPHAGOGASTRODUODENOSCOPY (EGD) WITH PROPOFOL;  Surgeon: Scot Jun, MD;  Location: [facility] ENDOSCOPY;  Service: Endoscopy;  Laterality: N/A;   IRRIGATION AND DEBRIDEMENT FOOT Left 03/13/2018   Procedure: IRRIGATION AND DEBRIDEMENT FOOT WITH BONE BIOPSY WITH MISONIX DEBRIDER;  Surgeon: Park Liter, DPM;  Location: [facility] OR;  Service: Podiatry;  Laterality: Left;   IRRIGATION AND DEBRIDEMENT FOOT Left 12/06/2018   Procedure: Irrigation And Debridement Foot;  Surgeon: Felecia Shelling, DPM;  Location: [facility] OR;  Service: Podiatry;  Laterality: Left;   LUMBAR WOUND DEBRIDEMENT N/A 10/01/2015   Procedure:  LUMBAR WOUND DEBRIDEMENT;  Surgeon: Coletta Memos, MD;  Location: [facility] NEURO ORS;  Service: Neurosurgery;  Laterality: N/A;  LUMBAR WOUND DEBRIDEMENT   NASAL SINUS SURGERY     SAVORY DILATION N/A 01/14/2015   Procedure: SAVORY DILATION;  Surgeon: Scot Jun, MD;  Location: [facility] ENDOSCOPY;  Service: Endoscopy;  Laterality: N/A;   TONSILLECTOMY     WISDOM TOOTH EXTRACTION     Patient Active Problem List   Diagnosis Date Noted   Aortic atherosclerosis (HCC) 01/15/2023   History of diastolic dysfunction 04/18/2022   Mitral valve insufficiency 04/18/2022   Statin myopathy 11/23/2021   History of colitis 08/03/2021   Vitamin D deficiency 07/12/2020   Chronic low back pain 07/12/2020   Sepsis without acute organ dysfunction (HCC) 12/29/2019   Cervical disc disorder 12/29/2019   Brachial radiculitis 12/29/2019   Allergy to statin medication 12/01/2019   History of below knee amputation, left (HCC) 07/30/2019   PAD (peripheral artery disease) (HCC) 12/05/2018   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) 09/18/2018   Class 2 severe obesity with serious comorbidity and body mass index (BMI) of 35.0 to 35.9 in adult (HCC) 11/01/2017   B12 deficiency 11/01/2017   Diabetic polyneuropathy associated with type 2 diabetes mellitus (HCC) 12/13/2015   Lumbar stenosis with neurogenic claudication 09/02/2015   Chronic constipation 07/13/2015   OSA (obstructive sleep apnea) 06/25/2015   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/23/2015   Hyperlipidemia 04/23/2015   Statin intolerance 04/23/2015   Asthma, mild intermittent 02/01/2015   Type 2 diabetes mellitus with diabetic neuropathy, with long-term current use of insulin (HCC) 12/31/2014   Gastroesophageal reflux disease with esophagitis 12/31/2014   Bilateral carpal tunnel syndrome 12/01/2014   Diverticulosis of colon 11/30/2012   Benign essential hypertension 11/28/2012    ONSET DATE: 12/19/2022  REFERRING DIAG: R42 (ICD-10-CM)  - Vertigo   THERAPY DIAG:  Dizziness and giddiness  Unsteadiness on feet  Other abnormalities of gait and mobility  Rationale for Evaluation and Treatment: Rehabilitation  PERTINENT HISTORY: HTN, HLD, Diabetes Type 2, Asthma, GERD, Neuropathy, Stroke, L BKA, Mitral Valve Insufficency, Anemia, Low Back Pain, Cervical Disc Disorder, Charcot Foot, PAD, OA   PRECAUTIONS: Fall  SUBJECTIVE: Pt  back from vacation. She forget to bring her HEP handout, but performed what exercises she could remember.   PAIN:  Are you having pain? No   OBJECTIVE:  -seated horizontal head turns with gaze fixation 3x60 sec with progressive improvement in rate and symptom response  -seated vertical head nodding 3x30sec (more provocative, but 30sec tolerance is appropriate)       PATIENT EDUCATION: Education details:  Person educated: Patient Education method: Explanation Education comprehension: verbalized understanding   HOME EXERCISE PROGRAM:  Established following HEP and reviewed during session:  Access  Code: [email or web address] URL: [email or web address] Date: 01/25/2023 Prepared by: Marlowe Kays  Program Notes Monitor dizziness and symptoms, they will increase with these exercises but if they get to to 8/10 or above. Please stop and allow for symptoms to resolve.   Exercises - Seated Horizontal Smooth Pursuit  - 2 x daily - 5 x weekly - 1 reps - 30 seconds hold - Seated Vertical Smooth Pursuit  - 2 x daily - 5 x weekly - 1 reps - 30 seconds hold - Seated Head Turns Vestibular Habituation  - 2 x daily - 5 x weekly - 5 reps - Seated Head Nod  - 2 x daily - 5 x weekly - 5 reps - Seated Gaze Stabilization with Head Rotation  - 2 x daily - 5 x weekly - 1 sets - 30 seconds hold  Access Code: [email or web address]   GOALS: Goals reviewed with patient? Yes   SHORT TERM GOALS: Target date: 01/26/2023   Pt will be independent with initial vestibular/balance HEP for improved vestibular input and  reduced motion sensitivity  Baseline: no HEP established; reports independence Goal status: MET   2.  Pt will report </= 3/5 for all movements on MSQ to indicate improvement in motion sensitivity and improved activity tolerance.  Baseline: 4/5; 2-3/5 Goal status: MET     LONG TERM GOALS: Target date: 02/23/2023   Pt will be independent with final vestibular/balance HEP for improved vestibular input and reduced motion sensitivity  Baseline: no HEP established Goal status: INITIAL   2.  Pt will report </= 2/5 for all movements on MSQ to indicate improvement in motion sensitivity and improved activity tolerance.  Baseline: 4/5 Goal status: INITIAL   3.  Pt will be able to complete gaze stabilization for >/= 30 seconds with less than </= 5/10 dizziness.  Baseline: significant/severe dizziness with VOR Goal status: INITIAL   4.  Pt will report 50% improvement in dizziness with functional activities including bed mobility and head turns Baseline: severe dizziness Goal status: INITIAL     ASSESSMENT:   CLINICAL IMPRESSION: Short visit due to traffic jam. Pt motivated to work with remaining available time, she really wants to get better and improve her symptoms, reduce her disability. Chartered loss adjuster reinforced pt becoming compliant with HEP now that she is back in town from vacation. Took time to discuss qualitative measures pertaining to gaze stabilization.  HEP Patient is making steady progress with therapy and will continue to benefit from skilled PT services to progress toward LTGs.        OBJECTIVE IMPAIRMENTS: Abnormal gait, decreased activity tolerance, decreased balance, decreased mobility, difficulty walking, decreased ROM, dizziness, impaired sensation, and pain.    ACTIVITY LIMITATIONS: bending, standing, transfers, bed mobility, and locomotion level   PARTICIPATION LIMITATIONS: cleaning and community activity   PERSONAL FACTORS: Age and 3+ comorbidities: HTN, HLD, Diabetes Type  2, Asthma, GERD, Neuropathy, Stroke, L BKA, Mitral Valve Insufficency, Anemia, Low Back Pain, Cervical Disc Disorder, Charcot Foot, PAD, OA  are also affecting patient's functional outcome.    REHAB POTENTIAL: Good   CLINICAL DECISION MAKING: Evolving/moderate complexity   EVALUATION COMPLEXITY: Moderate     PLAN:   PT FREQUENCY: 2x/week   PT DURATION: 8 weeks   PLANNED INTERVENTIONS: Therapeutic exercises, Therapeutic activity, Neuromuscular re-education, Balance training, Gait training, Patient/Family education, Self Care, Joint mobilization, Stair training, Vestibular training, Canalith repositioning, Orthotic/Fit training, DME instructions, Moist heat, and Manual therapy   PLAN FOR NEXT SESSION: Reassess BPPV testing if warranted. Continue to  progress VOR and Smooth Pursuits/Saccades, and Habituation.   10:51 AM, 02/13/23 Rosamaria Lints, PT, DPT Physical Therapist - [facility]  [phone] [facility])

## 2023-02-14 ENCOUNTER — Ambulatory Visit
Admission: RE | Admit: 2023-02-14 | Discharge: 2023-02-14 | Disposition: A | Discharge status: Home / Self Care | Payer: HMO | Source: Ambulatory Visit | Attending: Family Medicine

## 2023-02-14 ENCOUNTER — Ambulatory Visit
Admission: RE | Admit: 2023-02-14 | Discharge: 2023-02-14 | Disposition: A | Discharge status: Home / Self Care | Payer: HMO | Source: Ambulatory Visit | Attending: Family Medicine | Admitting: Family Medicine

## 2023-02-14 DIAGNOSIS — Z1231 Encounter for screening mammogram for malignant neoplasm of breast: Secondary | ICD-10-CM | POA: Diagnosis not present

## 2023-02-14 DIAGNOSIS — M85832 Other specified disorders of bone density and structure, left forearm: Secondary | ICD-10-CM | POA: Diagnosis not present

## 2023-02-14 DIAGNOSIS — Z780 Asymptomatic menopausal state: Secondary | ICD-10-CM | POA: Insufficient documentation

## 2023-02-14 DIAGNOSIS — M85852 Other specified disorders of bone density and structure, left thigh: Secondary | ICD-10-CM | POA: Diagnosis not present

## 2023-02-15 ENCOUNTER — Ambulatory Visit: Payer: HMO

## 2023-02-15 DIAGNOSIS — R42 Dizziness and giddiness: Secondary | ICD-10-CM

## 2023-02-15 DIAGNOSIS — R2681 Unsteadiness on feet: Secondary | ICD-10-CM

## 2023-02-15 DIAGNOSIS — R2689 Other abnormalities of gait and mobility: Secondary | ICD-10-CM

## 2023-02-15 NOTE — Therapy (Signed)
OUTPATIENT PHYSICAL THERAPY TREATMENT    Patient Name: Olivia Romero MRN: [id number] DOB:[birth year], 67 y.o., female Today's Date: 02/15/2023  PCP: Danelle Berry, PA-C  REFERRING PROVIDER: Danelle Berry, PA-C   END OF SESSION:  PT End of Session - 02/15/23 0957     Visit Number 4    Number of Visits 17    Date for PT Re-Evaluation 02/23/23    Authorization Type HTA (VL: Based on MN)    Progress Note Due on Visit 10    PT Start Time 0930    PT Stop Time 1010    PT Time Calculation (min) 40 min    Activity Tolerance Patient tolerated treatment well    Behavior During Therapy WFL for tasks assessed/performed             Past Medical History:  Diagnosis Date   Anemia    Arthritis    Asthma    Colon polyps    adenomatous   Diabetes mellitus without complication (HCC)    Diabetic infection of left foot (HCC) 12/2018   Diverticulosis of colon    Dysphagia 12/31/2014   Esophagitis    GERD (gastroesophageal reflux disease)    Hemorrhoid    internal   Hyperlipemia    Hypertension    IBS (irritable bowel syndrome)    no current prob - diet controlled   MRSA bacteremia 12/05/2018   Myalgia due to statin 11/19/2017   Neuropathy    Neuropathy of both feet    Neuropathy of hand    Pneumonia    x 4   PONV (postoperative nausea and vomiting)    on some surgeries but not all procedures   Skin ulcer of right ankle, limited to breakdown of skin (HCC) 12/31/2016   resolved per patient 05/14/19   Sleep apnea    has had in the past lost 50 pounds and do longer uses cpap   Statin intolerance 04/23/2015   Stroke (HCC) 2009   mini stroke per patient   Stroke-like episode 2009   TIA - mini stroke per patient   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 07/13/2015   Uncontrolled type 2 diabetes mellitus with hyperglycemia (HCC) 11/01/2017   Past Surgical History:  Procedure Laterality Date   ABDOMINAL HYSTERECTOMY     AMPUTATION Left 12/06/2018    Procedure: PARTIAL AMPUTATION LEFT FOOT;  Surgeon: Felecia Shelling, DPM;  Location: [facility] OR;  Service: Podiatry;  Laterality: Left;   AMPUTATION Left 05/16/2019   Procedure: LEFT BELOW KNEE AMPUTATION;  Surgeon: Nadara Mustard, MD;  Location: [facility] OR;  Service: Orthopedics;  Laterality: Left;   APPENDECTOMY     removed in 11th grade   BACK SURGERY  09/02/2015   BONE BIOPSY Left 12/06/2018   Procedure: Bone Biopsy;  Surgeon: Felecia Shelling, DPM;  Location: [facility] OR;  Service: Podiatry;  Laterality: Left;   CERVICAL FUSION     CESAREAN SECTION  1986   COLONOSCOPY N/A 11/30/2012   Procedure: COLONOSCOPY;  Surgeon: Hilarie Fredrickson, MD;  Location: [facility] ENDOSCOPY;  Service: Endoscopy;  Laterality: N/A;   COLONOSCOPY N/A 09/05/2021   Procedure: COLONOSCOPY;  Surgeon: Regis Bill, MD;  Location: [facility] ENDOSCOPY;  Service: Endoscopy;  Laterality: N/A;   COLONOSCOPY WITH PROPOFOL N/A 01/03/2016   Procedure: COLONOSCOPY WITH PROPOFOL;  Surgeon: Scot Jun, MD;  Location: [facility] ENDOSCOPY;  Service: Endoscopy;  Laterality: N/A;   ESOPHAGOGASTRODUODENOSCOPY N/A 09/05/2021   Procedure: ESOPHAGOGASTRODUODENOSCOPY (EGD);  Surgeon: Regis Bill,  MD;  Location: [facility] ENDOSCOPY;  Service: Endoscopy;  Laterality: N/A;   ESOPHAGOGASTRODUODENOSCOPY (EGD) WITH PROPOFOL N/A 01/14/2015   Procedure: ESOPHAGOGASTRODUODENOSCOPY (EGD) WITH PROPOFOL;  Surgeon: Scot Jun, MD;  Location: [facility] ENDOSCOPY;  Service: Endoscopy;  Laterality: N/A;   IRRIGATION AND DEBRIDEMENT FOOT Left 03/13/2018   Procedure: IRRIGATION AND DEBRIDEMENT FOOT WITH BONE BIOPSY WITH MISONIX DEBRIDER;  Surgeon: Park Liter, DPM;  Location: [facility] OR;  Service: Podiatry;  Laterality: Left;   IRRIGATION AND DEBRIDEMENT FOOT Left 12/06/2018   Procedure: Irrigation And Debridement Foot;  Surgeon: Felecia Shelling, DPM;  Location: [facility] OR;  Service: Podiatry;  Laterality: Left;   LUMBAR WOUND DEBRIDEMENT N/A 10/01/2015   Procedure: LUMBAR WOUND  DEBRIDEMENT;  Surgeon: Coletta Memos, MD;  Location: [facility] NEURO ORS;  Service: Neurosurgery;  Laterality: N/A;  LUMBAR WOUND DEBRIDEMENT   NASAL SINUS SURGERY     SAVORY DILATION N/A 01/14/2015   Procedure: SAVORY DILATION;  Surgeon: Scot Jun, MD;  Location: [facility] ENDOSCOPY;  Service: Endoscopy;  Laterality: N/A;   TONSILLECTOMY     WISDOM TOOTH EXTRACTION     Patient Active Problem List   Diagnosis Date Noted   Aortic atherosclerosis (HCC) 01/15/2023   History of diastolic dysfunction 04/18/2022   Mitral valve insufficiency 04/18/2022   Statin myopathy 11/23/2021   History of colitis 08/03/2021   Vitamin D deficiency 07/12/2020   Chronic low back pain 07/12/2020   Sepsis without acute organ dysfunction (HCC) 12/29/2019   Cervical disc disorder 12/29/2019   Brachial radiculitis 12/29/2019   Allergy to statin medication 12/01/2019   History of below knee amputation, left (HCC) 07/30/2019   PAD (peripheral artery disease) (HCC) 12/05/2018   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) 09/18/2018   Class 2 severe obesity with serious comorbidity and body mass index (BMI) of 35.0 to 35.9 in adult (HCC) 11/01/2017   B12 deficiency 11/01/2017   Diabetic polyneuropathy associated with type 2 diabetes mellitus (HCC) 12/13/2015   Lumbar stenosis with neurogenic claudication 09/02/2015   Chronic constipation 07/13/2015   OSA (obstructive sleep apnea) 06/25/2015   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/23/2015   Hyperlipidemia 04/23/2015   Statin intolerance 04/23/2015   Asthma, mild intermittent 02/01/2015   Type 2 diabetes mellitus with diabetic neuropathy, with long-term current use of insulin (HCC) 12/31/2014   Gastroesophageal reflux disease with esophagitis 12/31/2014   Bilateral carpal tunnel syndrome 12/01/2014   Diverticulosis of colon 11/30/2012   Benign essential hypertension 11/28/2012    ONSET DATE: 12/19/2022  REFERRING DIAG: R42 (ICD-10-CM) - Vertigo    THERAPY DIAG:  Dizziness and giddiness  Unsteadiness on feet  Other abnormalities of gait and mobility  Rationale for Evaluation and Treatment: Rehabilitation  PERTINENT HISTORY: HTN, HLD, Diabetes Type 2, Asthma, GERD, Neuropathy, Stroke, L BKA, Mitral Valve Insufficency, Anemia, Low Back Pain, Cervical Disc Disorder, Charcot Foot, PAD, OA   PRECAUTIONS: Fall  SUBJECTIVE: Pt pleased to report working on VOR training a lot since last session, symptom response improving, particularly in sagittal plane.   PAIN:  Are you having pain? No   OBJECTIVE:  Horizontal VOR gaze fixation 1x30sec (no symptoms, no visual acuity loss, 0.5Hz  (self selected speed)  Horizontal VOR gaze fixation 1x30sec (cues to increase speed, but no significant increase in speed Vertical VOR gaze fixation 1x30sec 30sec eyes closed for visual system recovery Horizontal VOR gaze fixation 1x30sec  Vertical VOR gaze fixation 1x30sec (0.65Hz ) *no fuzzy symptoms this 2nd time, success is celebrated -30sec eyes closed  for visual system recovery -Horizontal VOR gaze fixation 1x45sec  -Vertical VOR gaze fixation 1x30sec (~0.80Hz )  -30sec eyes closed for visual system recovery  -Rt/Lt saccades seated, alternating at tolerated speed 3x30sec    -Rt/ Left saccades with heard turn trailing 3x30sec (a little bit more of a cognitive challenge)  *used puppy and kitten pictures for empirical benefit Nittono H, Fukushima M, Yano A, Moriya H. The power of Kawaii: viewing cute images promotes a careful behavior and narrows attentional focus. PLoS One. 2012;7(9):e46362. doi: 10.1371/journal.pone.1610960. Epub 2012 Sep 26. PMID: [id number]; PMCID: [id number].      PATIENT EDUCATION: Education details:  Person educated: Patient Education method: Explanation Education comprehension: verbalized understanding   HOME EXERCISE PROGRAM:  Established following HEP and reviewed during session:  Access Code: [email or web address] URL:  [email or web address] Date: 01/25/2023 Prepared by: Marlowe Kays  Program Notes Monitor dizziness and symptoms, they will increase with these exercises but if they get to to 8/10 or above. Please stop and allow for symptoms to resolve.   Exercises - Seated Horizontal Smooth Pursuit  - 2 x daily - 5 x weekly - 1 reps - 30 seconds hold - Seated Vertical Smooth Pursuit  - 2 x daily - 5 x weekly - 1 reps - 30 seconds hold - Seated Head Turns Vestibular Habituation  - 2 x daily - 5 x weekly - 5 reps - Seated Head Nod  - 2 x daily - 5 x weekly - 5 reps - Seated Gaze Stabilization with Head Rotation  - 2 x daily - 5 x weekly - 1 sets - 30 seconds hold  Access Code: [email or web address]   GOALS: Goals reviewed with patient? Yes   SHORT TERM GOALS: Target date: 01/26/2023   Pt will be independent with initial vestibular/balance HEP for improved vestibular input and reduced motion sensitivity  Baseline: no HEP established; reports independence Goal status: MET   2.  Pt will report </= 3/5 for all movements on MSQ to indicate improvement in motion sensitivity and improved activity tolerance.  Baseline: 4/5; 2-3/5 Goal status: MET     LONG TERM GOALS: Target date: 02/23/2023   Pt will be independent with final vestibular/balance HEP for improved vestibular input and reduced motion sensitivity  Baseline: no HEP established Goal status: INITIAL   2.  Pt will report </= 2/5 for all movements on MSQ to indicate improvement in motion sensitivity and improved activity tolerance.  Baseline: 4/5 Goal status: INITIAL   3.  Pt will be able to complete gaze stabilization for >/= 30 seconds with less than </= 5/10 dizziness.  Baseline: significant/severe dizziness with VOR Goal status: INITIAL   4.  Pt will report 50% improvement in dizziness with functional activities including bed mobility and head turns Baseline: severe dizziness Goal status: INITIAL     ASSESSMENT:   CLINICAL  IMPRESSION: Visual system training continues to improve with repeat efforts. Pt making steady prgress overall. HEP compliance also improving. Patient is making steady progress with therapy and will continue to benefit from skilled PT services to progress toward LTGs.        OBJECTIVE IMPAIRMENTS: Abnormal gait, decreased activity tolerance, decreased balance, decreased mobility, difficulty walking, decreased ROM, dizziness, impaired sensation, and pain.    ACTIVITY LIMITATIONS: bending, standing, transfers, bed mobility, and locomotion level   PARTICIPATION LIMITATIONS: cleaning and community activity   PERSONAL FACTORS: Age and 3+ comorbidities: HTN, HLD, Diabetes Type 2, Asthma, GERD, Neuropathy, Stroke, L BKA, Mitral Valve Insufficency, Anemia, Low Back Pain, Cervical  Disc Disorder, Charcot Foot, PAD, OA  are also affecting patient's functional outcome.    REHAB POTENTIAL: Good   CLINICAL DECISION MAKING: Evolving/moderate complexity   EVALUATION COMPLEXITY: Moderate     PLAN:   PT FREQUENCY: 2x/week   PT DURATION: 8 weeks   PLANNED INTERVENTIONS: Therapeutic exercises, Therapeutic activity, Neuromuscular re-education, Balance training, Gait training, Patient/Family education, Self Care, Joint mobilization, Stair training, Vestibular training, Canalith repositioning, Orthotic/Fit training, DME instructions, Moist heat, and Manual therapy   PLAN FOR NEXT SESSION: Reassess BPPV testing if warranted. Continue to progress VOR and Smooth Pursuits/Saccades, and Habituation.   10:06 AM, 02/15/23 Rosamaria Lints, PT, DPT Physical Therapist - [facility]  [phone] [facility])

## 2023-02-19 ENCOUNTER — Ambulatory Visit (INDEPENDENT_AMBULATORY_CARE_PROVIDER_SITE_OTHER): Payer: HMO | Admitting: Podiatry

## 2023-02-19 DIAGNOSIS — E1149 Type 2 diabetes mellitus with other diabetic neurological complication: Secondary | ICD-10-CM | POA: Diagnosis not present

## 2023-02-19 DIAGNOSIS — L97511 Non-pressure chronic ulcer of other part of right foot limited to breakdown of skin: Secondary | ICD-10-CM | POA: Diagnosis not present

## 2023-02-19 DIAGNOSIS — M14671 Charcot's joint, right ankle and foot: Secondary | ICD-10-CM | POA: Diagnosis not present

## 2023-02-19 NOTE — Progress Notes (Signed)
Subjective: Chief Complaint  Patient presents with   Callouses    67 y.o. returns the office today for follow-up evaluation of calluses on her right foot.  Since I saw her last she was in [place], [place] and she got into the pool and she scraped her foot developing small wound.  She thinks getting better.  She does not report any drainage or pus.  No fevers or chills.  Bethann Berkshire, pedorthist added a pad to offload inside the [place] brace however she states the patch came off.  No open lesions otherwise. Otherwise has been doing well.  PCP: Danelle Berry, PA-C Endocrinologist: Dr. Carlena Sax- last seen 12/14/2022  Objective: AAO 3, NAD DP/PT pulses palpable, CRT less than 3 seconds Left below-knee amputation Sensation absent with Semmes Weinstein monofilament On the right foot fifth toe bilaterally there is a superficial area of abrasion, skin breakdown without any edema, erythema, drainage or pus or signs of infection.  There is preulcerative calluses noted along the fifth metatarsal head, base without any skin breakdown.  There is no there is irritation noted.  She does have a bunion with some faint erythema from inflammation but this appears to be clinically improved compared to what it last month saw her last appointment. Small amount of macerated tissue on the fourth interspace but superficial a skin breakdown moisture present.  There is no probing to bone or tunneling. No pain with calf compression, swelling, warmth, erythema   Assessment: Right fifth toe ulceration; interspace maceration; Charcot  Plan: -Treatment options including alternatives, risks, complications were discussed -There is hyperkeratotic lesion noted on the area the wound which I debrided today any complications or bleeding.  Recommend a small amount of antibiotic ointment and a bandage daily.  Offloading.  This seems to be healing and hope is to continue to heal uneventfully.  She is to monitor closely for  any signs or symptoms of worsening or infection of the medial knee that should any occur. -Dry thoroughly between toes. -At next appointment I am also going to have her also follow-up with Bethann Berkshire, pedorthist for brace modifications.  Previously ice on the area bunion but it came off.  Not sure if we need to reapply this or send back for modifications.   Vivi Barrack DPM

## 2023-02-20 ENCOUNTER — Ambulatory Visit: Payer: HMO

## 2023-02-22 ENCOUNTER — Ambulatory Visit: Payer: HMO

## 2023-02-22 DIAGNOSIS — R42 Dizziness and giddiness: Secondary | ICD-10-CM | POA: Diagnosis not present

## 2023-02-22 DIAGNOSIS — R2689 Other abnormalities of gait and mobility: Secondary | ICD-10-CM

## 2023-02-22 DIAGNOSIS — R2681 Unsteadiness on feet: Secondary | ICD-10-CM

## 2023-02-22 NOTE — Therapy (Signed)
OUTPATIENT PHYSICAL THERAPY TREATMENT/DISCHARGE SUMMARY   Patient Name: Olivia Romero MRN: [id number] DOB:1956/03/31, 67 y.o., female Today's Date: 02/22/2023  PCP: Danelle Berry, PA-C  REFERRING PROVIDER: Danelle Berry, PA-C   PHYSICAL THERAPY DISCHARGE SUMMARY  Visits from Start of Care: 5  Current functional level related to goals / functional outcomes: See Clinical Impression Statement   Remaining deficits: Mild Dizziness   Education / Equipment: HEP Provided   Patient agrees to discharge. Patient goals were met. Patient is being discharged due to  meeting stated rehab goals and patient request to continue progression at home due to difficulty with transportation.   END OF SESSION:  PT End of Session - 02/22/23 0937     Visit Number 5    Number of Visits 17    Date for PT Re-Evaluation 02/23/23    Authorization Type HTA (VL: Based on [place])    Progress Note Due on Visit 10    PT Start Time [phone]   pt arrived late   PT Stop Time 1014    PT Time Calculation (min) 37 min    Activity Tolerance Patient tolerated treatment well    Behavior During Therapy WFL for tasks assessed/performed             Past Medical History:  Diagnosis Date   Anemia    Arthritis    Asthma    Colon polyps    adenomatous   Diabetes mellitus without complication (HCC)    Diabetic infection of left foot (HCC) 12/2018   Diverticulosis of colon    Dysphagia 12/31/2014   Esophagitis    GERD (gastroesophageal reflux disease)    Hemorrhoid    internal   Hyperlipemia    Hypertension    IBS (irritable bowel syndrome)    no current prob - diet controlled   MRSA bacteremia 12/05/2018   Myalgia due to statin 11/19/2017   Neuropathy    Neuropathy of both feet    Neuropathy of hand    Pneumonia    x 4   PONV (postoperative nausea and vomiting)    on some surgeries but not all procedures   Skin ulcer of right ankle, limited to breakdown of skin (HCC) 12/31/2016   resolved per patient  05/14/19   Sleep apnea    has had in the past lost 50 pounds and do longer uses cpap   Statin intolerance 04/23/2015   Stroke (HCC) 2009   mini stroke per patient   Stroke-like episode 2009   TIA - mini stroke per patient   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 07/13/2015   Uncontrolled type 2 diabetes mellitus with hyperglycemia (HCC) 11/01/2017   Past Surgical History:  Procedure Laterality Date   ABDOMINAL HYSTERECTOMY     AMPUTATION Left 12/06/2018   Procedure: PARTIAL AMPUTATION LEFT FOOT;  Surgeon: Felecia Shelling, DPM;  Location: [facility] OR;  Service: Podiatry;  Laterality: Left;   AMPUTATION Left 05/16/2019   Procedure: LEFT BELOW KNEE AMPUTATION;  Surgeon: Nadara Mustard, MD;  Location: [facility] OR;  Service: Orthopedics;  Laterality: Left;   APPENDECTOMY     removed in 11th grade   BACK SURGERY  09/02/2015   BONE BIOPSY Left 12/06/2018   Procedure: Bone Biopsy;  Surgeon: Felecia Shelling, DPM;  Location: [facility] OR;  Service: Podiatry;  Laterality: Left;   CERVICAL FUSION     CESAREAN SECTION  1986   COLONOSCOPY N/A 11/30/2012   Procedure: COLONOSCOPY;  Surgeon: Hilarie Fredrickson, MD;  Location: [facility] ENDOSCOPY;  Service: Endoscopy;  Laterality: N/A;   COLONOSCOPY N/A 09/05/2021   Procedure: COLONOSCOPY;  Surgeon: Regis Bill, MD;  Location: [facility] ENDOSCOPY;  Service: Endoscopy;  Laterality: N/A;   COLONOSCOPY WITH PROPOFOL N/A 01/03/2016   Procedure: COLONOSCOPY WITH PROPOFOL;  Surgeon: Scot Jun, MD;  Location: [facility] ENDOSCOPY;  Service: Endoscopy;  Laterality: N/A;   ESOPHAGOGASTRODUODENOSCOPY N/A 09/05/2021   Procedure: ESOPHAGOGASTRODUODENOSCOPY (EGD);  Surgeon: Regis Bill, MD;  Location: [facility] ENDOSCOPY;  Service: Endoscopy;  Laterality: N/A;   ESOPHAGOGASTRODUODENOSCOPY (EGD) WITH PROPOFOL N/A 01/14/2015   Procedure: ESOPHAGOGASTRODUODENOSCOPY (EGD) WITH PROPOFOL;  Surgeon: Scot Jun, MD;  Location: [facility] ENDOSCOPY;  Service: Endoscopy;   Laterality: N/A;   IRRIGATION AND DEBRIDEMENT FOOT Left 03/13/2018   Procedure: IRRIGATION AND DEBRIDEMENT FOOT WITH BONE BIOPSY WITH MISONIX DEBRIDER;  Surgeon: Park Liter, DPM;  Location: [facility] OR;  Service: Podiatry;  Laterality: Left;   IRRIGATION AND DEBRIDEMENT FOOT Left 12/06/2018   Procedure: Irrigation And Debridement Foot;  Surgeon: Felecia Shelling, DPM;  Location: [facility] OR;  Service: Podiatry;  Laterality: Left;   LUMBAR WOUND DEBRIDEMENT N/A 10/01/2015   Procedure: LUMBAR WOUND DEBRIDEMENT;  Surgeon: Coletta Memos, MD;  Location: [facility] NEURO ORS;  Service: Neurosurgery;  Laterality: N/A;  LUMBAR WOUND DEBRIDEMENT   NASAL SINUS SURGERY     SAVORY DILATION N/A 01/14/2015   Procedure: SAVORY DILATION;  Surgeon: Scot Jun, MD;  Location: [facility] ENDOSCOPY;  Service: Endoscopy;  Laterality: N/A;   TONSILLECTOMY     WISDOM TOOTH EXTRACTION     Patient Active Problem List   Diagnosis Date Noted   Aortic atherosclerosis (HCC) 01/15/2023   History of diastolic dysfunction 04/18/2022   Mitral valve insufficiency 04/18/2022   Statin myopathy 11/23/2021   History of colitis 08/03/2021   Vitamin D deficiency 07/12/2020   Chronic low back pain 07/12/2020   Sepsis without acute organ dysfunction (HCC) 12/29/2019   Cervical disc disorder 12/29/2019   Brachial radiculitis 12/29/2019   Allergy to statin medication 12/01/2019   History of below knee amputation, left (HCC) 07/30/2019   PAD (peripheral artery disease) (HCC) 12/05/2018   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) 09/18/2018   Class 2 severe obesity with serious comorbidity and body mass index (BMI) of 35.0 to 35.9 in adult (HCC) 11/01/2017   B12 deficiency 11/01/2017   Diabetic polyneuropathy associated with type 2 diabetes mellitus (HCC) 12/13/2015   Lumbar stenosis with neurogenic claudication 09/02/2015   Chronic constipation 07/13/2015   OSA (obstructive sleep apnea) 06/25/2015   Primary osteoarthritis involving  multiple joints 04/23/2015   Hyperlipidemia 04/23/2015   Statin intolerance 04/23/2015   Asthma, mild intermittent 02/01/2015   Type 2 diabetes mellitus with diabetic neuropathy, with long-term current use of insulin (HCC) 12/31/2014   Gastroesophageal reflux disease with esophagitis 12/31/2014   Bilateral carpal tunnel syndrome 12/01/2014   Diverticulosis of colon 11/30/2012   Benign essential hypertension 11/28/2012    ONSET DATE: 12/19/2022  REFERRING DIAG: R42 (ICD-10-CM) - Vertigo   THERAPY DIAG:  Dizziness and giddiness  Other abnormalities of gait and mobility  Unsteadiness on feet  Rationale for Evaluation and Treatment: Rehabilitation  PERTINENT HISTORY: HTN, HLD, Diabetes Type 2, Asthma, GERD, Neuropathy, Stroke, L BKA, Mitral Valve Insufficency, Anemia, Low Back Pain, Cervical Disc Disorder, Charcot Foot, PAD, OA   PRECAUTIONS: Fall  SUBJECTIVE: Patient no new changes. No falls to report.   PAIN:  Are you having pain? No   OBJECTIVE:  Motion  Sensitivity Quotient  Intensity: 0 = none, 1 = Lightheaded, 2 = Mild, 3 = Moderate, 4 = Severe, 5 = Vomiting  Intensity  1. Sitting to supine 1  2. Supine to L side 0  3. Supine to R side 0  4. Supine to sitting 1  5. L Hallpike-Dix 0  6. Up from L  1  7. R Hallpike-Dix 0  8. Up from R  1  9. Sitting, head  tipped to L knee 0  10. Head up from L  knee 1  11. Sitting, head  tipped to R knee 0  12. Head up from R  knee 0  13. Sitting head turns x5 0  14.Sitting head nods x5 2  15. In stance, 180  turn to L  0  16. In stance, 180  turn to R 0   Vestibular Treatment:   Gaze Adaptation:   x1 Viewing Horizontal: Position: Seated, Time: 30 secs; 60 seconds, and Reps: 2 and x1 Viewing Vertical:  Position: seated , Time: 30 secs, and Reps: 2 ; Patient tolerating increase in time with horizontal with mild dizziness and mild blurriness. Educating on progression of time with vertical VOR for self completion with HEP  upon discharge.   Established following HEP and reviewed during session, addressed questions/concerns. Educated on progression of exercises that can be completed!    Access Code: [email or web address] URL: [email or web address] Date: 01/25/2023 Prepared by: Marlowe Kays  Program Notes Monitor dizziness and symptoms, they will increase with these exercises but if they get to to 8/10 or above. Please stop and allow for symptoms to resolve.   Exercises - Seated Horizontal Smooth Pursuit  - 2 x daily - 5 x weekly - 1 reps - 30 seconds hold - Seated Vertical Smooth Pursuit  - 2 x daily - 5 x weekly - 1 reps - 30 seconds hold - Seated Head Turns Vestibular Habituation  - 2 x daily - 5 x weekly - 5 reps - Seated Head Nod  - 2 x daily - 5 x weekly - 5 reps - Seated Gaze Stabilization with Head Rotation  - 2 x daily - 5 x weekly - 1 sets - 30 seconds hold  PATIENT EDUCATION: Education details: HEP Review; Progress toward LTGs.  Person educated: Patient Education method: Explanation Education comprehension: verbalized understanding   HOME EXERCISE PROGRAM:  URL: [email or web address] Date: 01/25/2023 Prepared by: Marlowe Kays  Program Notes Monitor dizziness and symptoms, they will increase with these exercises but if they get to to 8/10 or above. Please stop and allow for symptoms to resolve.   Exercises - Seated Horizontal Smooth Pursuit  - 2 x daily - 5 x weekly - 1 reps - 30 seconds hold - Seated Vertical Smooth Pursuit  - 2 x daily - 5 x weekly - 1 reps - 30 seconds hold - Seated Head Turns Vestibular Habituation  - 2 x daily - 5 x weekly - 5 reps - Seated Head Nod  - 2 x daily - 5 x weekly - 5 reps - Seated Gaze Stabilization with Head Rotation  - 2 x daily - 5 x weekly - 1 sets - 30 seconds hold  Access Code: [email or web address]   GOALS: Goals reviewed with patient? Yes   SHORT TERM GOALS: Target date: 01/26/2023   Pt will be independent with initial  vestibular/balance HEP for improved vestibular input and reduced motion sensitivity  Baseline: no HEP established; reports independence Goal status: MET   2.  Pt  will report </= 3/5 for all movements on MSQ to indicate improvement in motion sensitivity and improved activity tolerance.  Baseline: 4/5; 2-3/5 Goal status: MET     LONG TERM GOALS: Target date: 02/23/2023   Pt will be independent with final vestibular/balance HEP for improved vestibular input and reduced motion sensitivity  Baseline: no HEP established; reports independence with HEP on 9/19 Goal status: MET   2.  Pt will report </= 2/5 for all movements on MSQ to indicate improvement in motion sensitivity and improved activity tolerance.  Baseline: 4/5; 1-2/5 Goal status: MET   3.  Pt will be able to complete gaze stabilization for >/= 30 seconds with less than </= 5/10 dizziness.  Baseline: significant/severe dizziness with VOR; able to complete 30 seconds with 3/10 dizziness Goal status: MET   4.  Pt will report 50% improvement in dizziness with functional activities including bed mobility and head turns;  Baseline: severe dizziness; reports 55% improvement and improved tolerance for bed mobility/head turns with functional task Goal status: MET     ASSESSMENT:   CLINICAL IMPRESSION:  Today's skilled session focused on assessment of patient's progress toward LTGs. Patient demonstrated improved tolerance for visual activities, VOR and reports 55% improvement in symptoms. Patient demo significant improvement on MSQ with only mild dizziness or lightheadedness. Patient requesting to discharge from PT services due to significant improvement in symptoms and feeling comfortable self management of symptoms at this time. PT agreeable. PT educating on updated HEP and progression of activities. Patient made significant progress and was able to meet all LTGs at this time and safe to discharge from PT services. Will d/c at this time.       OBJECTIVE IMPAIRMENTS: Abnormal gait, decreased activity tolerance, decreased balance, decreased mobility, difficulty walking, decreased ROM, dizziness, impaired sensation, and pain.    ACTIVITY LIMITATIONS: bending, standing, transfers, bed mobility, and locomotion level   PARTICIPATION LIMITATIONS: cleaning and community activity   PERSONAL FACTORS: Age and 3+ comorbidities: HTN, HLD, Diabetes Type 2, Asthma, GERD, Neuropathy, Stroke, L BKA, Mitral Valve Insufficency, Anemia, Low Back Pain, Cervical Disc Disorder, Charcot Foot, PAD, OA  are also affecting patient's functional outcome.    REHAB POTENTIAL: Good   CLINICAL DECISION MAKING: Evolving/moderate complexity   EVALUATION COMPLEXITY: Moderate     PLAN:   PT FREQUENCY: 2x/week   PT DURATION: 8 weeks   PLANNED INTERVENTIONS: Therapeutic exercises, Therapeutic activity, Neuromuscular re-education, Balance training, Gait training, Patient/Family education, Self Care, Joint mobilization, Stair training, Vestibular training, Canalith repositioning, Orthotic/Fit training, DME instructions, Moist heat, and Manual therapy   PLAN FOR NEXT SESSION: d/c this visit  Howie Ill, PT, DPT 02/22/23 12:16 PM

## 2023-03-01 DIAGNOSIS — E1165 Type 2 diabetes mellitus with hyperglycemia: Secondary | ICD-10-CM | POA: Diagnosis not present

## 2023-03-05 ENCOUNTER — Other Ambulatory Visit: Payer: HMO

## 2023-03-05 ENCOUNTER — Ambulatory Visit (INDEPENDENT_AMBULATORY_CARE_PROVIDER_SITE_OTHER): Payer: HMO | Admitting: Podiatry

## 2023-03-05 DIAGNOSIS — E1149 Type 2 diabetes mellitus with other diabetic neurological complication: Secondary | ICD-10-CM

## 2023-03-05 DIAGNOSIS — L84 Corns and callosities: Secondary | ICD-10-CM | POA: Diagnosis not present

## 2023-03-05 DIAGNOSIS — M21171 Varus deformity, not elsewhere classified, right ankle: Secondary | ICD-10-CM | POA: Diagnosis not present

## 2023-03-05 DIAGNOSIS — M14671 Charcot's joint, right ankle and foot: Secondary | ICD-10-CM | POA: Diagnosis not present

## 2023-03-05 NOTE — Progress Notes (Signed)
Subjective: Chief Complaint  Patient presents with   Wound Check    67 y.o. returns the office today for follow-up evaluation of calluses on her right foot.  She says that she been trying dry well between her toes.  She states that the fourth toenail on 7 but she denies any drainage or pus or any open lesions.  No injuries that she reports.  No fevers or chills.  No other concerns.  PCP: Danelle Berry, PA-C Endocrinologist: Dr. Carlena Sax- last seen 12/14/2022  Objective: AAO 3, NAD DP/PT pulses palpable, CRT less than 3 seconds Left below-knee amputation Sensation absent with Semmes Weinstein monofilament Cavovarus foot type is noted on the right side.  On the area of the bunion, lateral foot there are some preulcerative areas but there is no skin breakdown or warmth.  There are some mild maceration noted on the fourth interspace of the right foot.  There is no open lesion otherwise noted.  The nail and the fourth toes,.  There is no open lesion identified at this time area appears healing well.  There is hyperkeratotic tissue with some peeling skin present fifth toe.  There is no open lesion identified at this time there is no erythema warmth or any signs of infection. No pain with calf compression, swelling, warmth, erythema   Assessment: Right fifth toe ulceration; interspace maceration; Charcot  Plan: -Treatment options including alternatives, risks, complications were discussed -I did debride the nails as a courtesy without any complications or bleeding ulcer getting longer.  The fourth nails already, also appear to be healing well.  There was some of the callus on the fifth toe debrided today.  I recommended Tylenol between the toes.  No open lesions noted open into a cam boot today to avoid any excess pressure.  Monitoring skin breakdown signs or symptoms of infection. -She was seen today by Bethann Berkshire, pedorthist for modifications of her AFO brace.  Return for 2-3 weeks for  pre-ulcerative right foot .  Vivi Barrack DPM

## 2023-03-05 NOTE — Progress Notes (Deleted)
Patient was seen in room post visit with Dr Ardelle Anton brace is fitting well 1st MTPJ is rubbing on shoe inside I added padding like before but with a tongue pad to create a seamless area inside shoe to reduce pressure friction and sheering to foot patient also has concerns with brace making LE swell I explained the [place] brace is compressing the leg and pushing fluid upward and then is pooling above the proximal trim line / at mid calf level. Better compression would help increase venous low, return  and help to reduce and better control LE edema. Patient has better compression at home she reports and will try  Patient will call office if any other problems arise  Addison Bailey CPed, CFo, CFm

## 2023-03-26 ENCOUNTER — Ambulatory Visit (INDEPENDENT_AMBULATORY_CARE_PROVIDER_SITE_OTHER): Payer: HMO | Admitting: Podiatry

## 2023-03-26 DIAGNOSIS — M14671 Charcot's joint, right ankle and foot: Secondary | ICD-10-CM | POA: Diagnosis not present

## 2023-03-28 NOTE — Progress Notes (Signed)
Subjective: No chief complaint on file.   67 y.o. returns the office today for follow-up evaluation of calluses on her right foot.  Overall she is doing much better.  She been trying dry well between the toes.  He has been wearing a cam boot.  She has not seen any drainage or any open lesions.  No fevers or chills.  No new concerns.  No other concerns.    PCP: Danelle Berry, PA-C Endocrinologist: Dr. Carlena Sax  Objective: AAO 3, NAD DP/PT pulses palpable, CRT less than 3 seconds Left below-knee amputation Sensation absent with Semmes Weinstein monofilament Cavovarus foot type is noted on the right side.  On the area of the first MPJ, bunion there is a small area of erythema from irritation but much improved There is a prominent bunion.  There is no skin breakdown.  Along the fourth interspace there is no significant maceration noted today.  There are no open lesions noted.  There is no drainage or pus.  Area of peeling skin of the fifth toe is also resolved.  Overall doing better.  No pain with calf compression, swelling, warmth, erythema   Assessment: Right fifth toe ulceration; interspace maceration; Charcot  Plan: -Treatment options including alternatives, risks, complications were discussed -Overall she is doing much better.  Discussed staying in the CAM boot for now into the brace is fitting appropriately.  Discussed still dry between toes well, daily foot inspection.  Return in about 4 weeks (around 04/23/2023).  Vivi Barrack DPM

## 2023-03-31 DIAGNOSIS — E1165 Type 2 diabetes mellitus with hyperglycemia: Secondary | ICD-10-CM | POA: Diagnosis not present

## 2023-04-12 DIAGNOSIS — E1142 Type 2 diabetes mellitus with diabetic polyneuropathy: Secondary | ICD-10-CM | POA: Diagnosis not present

## 2023-04-16 DIAGNOSIS — Z794 Long term (current) use of insulin: Secondary | ICD-10-CM | POA: Diagnosis not present

## 2023-04-16 DIAGNOSIS — Z789 Other specified health status: Secondary | ICD-10-CM | POA: Diagnosis not present

## 2023-04-16 DIAGNOSIS — E1129 Type 2 diabetes mellitus with other diabetic kidney complication: Secondary | ICD-10-CM | POA: Diagnosis not present

## 2023-04-16 DIAGNOSIS — E113293 Type 2 diabetes mellitus with mild nonproliferative diabetic retinopathy without macular edema, bilateral: Secondary | ICD-10-CM | POA: Diagnosis not present

## 2023-04-16 DIAGNOSIS — Z89512 Acquired absence of left leg below knee: Secondary | ICD-10-CM | POA: Diagnosis not present

## 2023-04-16 DIAGNOSIS — E11610 Type 2 diabetes mellitus with diabetic neuropathic arthropathy: Secondary | ICD-10-CM | POA: Diagnosis not present

## 2023-04-16 DIAGNOSIS — E1142 Type 2 diabetes mellitus with diabetic polyneuropathy: Secondary | ICD-10-CM | POA: Diagnosis not present

## 2023-04-16 DIAGNOSIS — E781 Pure hyperglyceridemia: Secondary | ICD-10-CM | POA: Diagnosis not present

## 2023-04-16 DIAGNOSIS — R809 Proteinuria, unspecified: Secondary | ICD-10-CM | POA: Diagnosis not present

## 2023-04-27 ENCOUNTER — Ambulatory Visit (INDEPENDENT_AMBULATORY_CARE_PROVIDER_SITE_OTHER): Payer: HMO | Admitting: Podiatry

## 2023-04-27 ENCOUNTER — Encounter: Payer: Self-pay | Admitting: Podiatry

## 2023-04-27 DIAGNOSIS — E1149 Type 2 diabetes mellitus with other diabetic neurological complication: Secondary | ICD-10-CM

## 2023-04-27 DIAGNOSIS — M14671 Charcot's joint, right ankle and foot: Secondary | ICD-10-CM

## 2023-04-27 DIAGNOSIS — M21171 Varus deformity, not elsewhere classified, right ankle: Secondary | ICD-10-CM | POA: Diagnosis not present

## 2023-04-27 DIAGNOSIS — M79674 Pain in right toe(s): Secondary | ICD-10-CM | POA: Diagnosis not present

## 2023-04-27 DIAGNOSIS — B351 Tinea unguium: Secondary | ICD-10-CM | POA: Diagnosis not present

## 2023-04-30 NOTE — Progress Notes (Signed)
Subjective: Chief Complaint  Patient presents with   Foot Ulcer    RM#12 right foot ulcer check no draining or signs of new infection.     67 y.o. returns the office today for follow-up evaluation of calluses on her right foot.  She states overall she is doing much better.  She does not see any peeling skin or any redness.  She is back to wearing her regular shoe with the [place] brace.  Does not report any open lesions or any new concerns today.   PCP: Danelle Berry, PA-C Endocrinologist: Dr. Carlena Sax Last A1c was 9.1 on 05/02/2023  Objective: AAO 3, NAD DP/PT pulses palpable, CRT less than 3 seconds Left below-knee amputation Sensation absent with Semmes Weinstein monofilament He also mildly hypertrophic dystrophic with yellow discoloration.  She has tenderness nails 1 through 5 on the right foot.  No open lesions.  No macerated tissue interdigitally The area of the bunion as well as laterally along the fifth metatarsal movement has mild irritation, red skin present there is no skin breakdown, warmth and is blanchable.  There is no skin breakdown. Cavovarus foot type. No pain with calf compression, swelling, warmth, erythema   Assessment: Right fifth toe ulceration; interspace maceration; Charcot; symptomatic onychosis  Plan:  Symptomatic onychomycosis -Sharply debrided nails x 5 without any complications or bleeding  Charcot, right -Continue [place] brace -Glucose control  Pre-ulcerative areas -Continue offloading.  Monitor for any skin breakdown any signs or symptoms of infection. -Glucose control -Daily foot inspection.  Return in about 2 months (around 06/28/2023).  Vivi Barrack DPM

## 2023-05-01 DIAGNOSIS — E1165 Type 2 diabetes mellitus with hyperglycemia: Secondary | ICD-10-CM | POA: Diagnosis not present

## 2023-05-31 DIAGNOSIS — E1165 Type 2 diabetes mellitus with hyperglycemia: Secondary | ICD-10-CM | POA: Diagnosis not present

## 2023-06-28 ENCOUNTER — Ambulatory Visit (INDEPENDENT_AMBULATORY_CARE_PROVIDER_SITE_OTHER): Payer: HMO | Admitting: Podiatry

## 2023-06-28 DIAGNOSIS — E1149 Type 2 diabetes mellitus with other diabetic neurological complication: Secondary | ICD-10-CM | POA: Diagnosis not present

## 2023-06-28 DIAGNOSIS — L84 Corns and callosities: Secondary | ICD-10-CM

## 2023-06-28 DIAGNOSIS — B351 Tinea unguium: Secondary | ICD-10-CM | POA: Diagnosis not present

## 2023-06-28 DIAGNOSIS — M79674 Pain in right toe(s): Secondary | ICD-10-CM

## 2023-06-28 DIAGNOSIS — M21171 Varus deformity, not elsewhere classified, right ankle: Secondary | ICD-10-CM | POA: Diagnosis not present

## 2023-06-28 DIAGNOSIS — M14671 Charcot's joint, right ankle and foot: Secondary | ICD-10-CM | POA: Diagnosis not present

## 2023-06-28 DIAGNOSIS — M10071 Idiopathic gout, right ankle and foot: Secondary | ICD-10-CM

## 2023-06-28 MED ORDER — COLCHICINE 0.6 MG PO TABS
0.6000 mg | ORAL_TABLET | Freq: Every day | ORAL | 0 refills | Status: AC
Start: 2023-06-28 — End: ?

## 2023-06-28 NOTE — Progress Notes (Signed)
Subjective: Chief Complaint  Patient presents with   [facility]    RM#12 [facility] patient has no concerns today but has a question about gout aches /burning on the outside of right big toe. Does not hurt when waking only when foot is resting.     68 y.o. returns the office today for follow-up evaluation of calluses on her right foot.  States that she has been doing well she denies any skin breakdown.  She does not report any fevers or chills.  She does have new concerns today of gout in her right big toe joint.  She said that she has had gout previously in the last couple days she has noted some swelling and redness on the first MTPJ.  She does not recall any recent injury or changes otherwise.  She previously would be on colchicine for this and it was helpful.   PCP: Danelle Berry, PA-C Endocrinologist: Dr. Carlena Sax Last A1c was 9.1 on 05/02/2023  Objective: AAO 3, NAD DP/PT pulses palpable, CRT less than 3 seconds Left below-knee amputation Sensation absent with Semmes Weinstein monofilament He also mildly hypertrophic dystrophic with yellow discoloration.  She has tenderness nails 1 through 5 on the right foot.   On the plantar lateral aspect the foot is a thick hyperkeratotic lesion noted without any underlying ulceration, drainage or any signs of infection.  There is no open lesions identified. On the right person.  There is localized edema and erythema.  There is no ascending cellulitis or open lesions or preulcerative lesions. Cavovarus foot type. No pain with calf compression, swelling, warmth, erythema   Assessment: Right fifth toe ulceration; interspace maceration; Charcot; symptomatic onychomycosis; gout  Plan:  Symptomatic onychomycosis -Sharply debrided nails x 5 without any complications or bleeding  Charcot, right -Continue [place] brace.  This has been beneficial for her.  Although over the last couple of years her foot has changed anything is becoming more cavus and putting  increased pressure to the lateral aspect of foot she is getting increased soft tissue irritation and callus formation.  We have tried modifications to her current brace without any improvement.  Due to this I do think is medically necessary to proceed with a new brace.  We will try to obtain prior authorization for this. -Glucose control  Pre-ulcerative areas -Sharply debrided lesion x 1 without any complications or bleeding. -Continue offloading.  Monitor for any skin breakdown any signs or symptoms of infection. -Glucose control -Daily foot inspection.  Gout -We discussed getting blood work but she states that she has had this before this is what it feels like.  Will start colchicine if no improvement will get blood work, x-rays.  Discussed side effects.  Return in about 2 months (around 08/29/2023).  Vivi Barrack DPM

## 2023-07-01 DIAGNOSIS — E1165 Type 2 diabetes mellitus with hyperglycemia: Secondary | ICD-10-CM | POA: Diagnosis not present

## 2023-07-02 DIAGNOSIS — Z794 Long term (current) use of insulin: Secondary | ICD-10-CM | POA: Diagnosis not present

## 2023-07-02 DIAGNOSIS — E1169 Type 2 diabetes mellitus with other specified complication: Secondary | ICD-10-CM | POA: Diagnosis not present

## 2023-07-02 DIAGNOSIS — E785 Hyperlipidemia, unspecified: Secondary | ICD-10-CM | POA: Diagnosis not present

## 2023-07-02 DIAGNOSIS — E1151 Type 2 diabetes mellitus with diabetic peripheral angiopathy without gangrene: Secondary | ICD-10-CM | POA: Diagnosis not present

## 2023-07-02 DIAGNOSIS — E1165 Type 2 diabetes mellitus with hyperglycemia: Secondary | ICD-10-CM | POA: Diagnosis not present

## 2023-07-02 DIAGNOSIS — D8481 Immunodeficiency due to conditions classified elsewhere: Secondary | ICD-10-CM | POA: Diagnosis not present

## 2023-07-02 DIAGNOSIS — E1142 Type 2 diabetes mellitus with diabetic polyneuropathy: Secondary | ICD-10-CM | POA: Diagnosis not present

## 2023-07-02 DIAGNOSIS — E1136 Type 2 diabetes mellitus with diabetic cataract: Secondary | ICD-10-CM | POA: Diagnosis not present

## 2023-07-02 DIAGNOSIS — E11319 Type 2 diabetes mellitus with unspecified diabetic retinopathy without macular edema: Secondary | ICD-10-CM | POA: Diagnosis not present

## 2023-07-02 DIAGNOSIS — Z89512 Acquired absence of left leg below knee: Secondary | ICD-10-CM | POA: Diagnosis not present

## 2023-07-02 DIAGNOSIS — G4733 Obstructive sleep apnea (adult) (pediatric): Secondary | ICD-10-CM | POA: Diagnosis not present

## 2023-07-02 NOTE — Progress Notes (Signed)
Prior auth request sent for new AFO  Olivia Romero

## 2023-07-06 DIAGNOSIS — R2689 Other abnormalities of gait and mobility: Secondary | ICD-10-CM | POA: Diagnosis not present

## 2023-07-06 DIAGNOSIS — E569 Vitamin deficiency, unspecified: Secondary | ICD-10-CM | POA: Diagnosis not present

## 2023-07-06 DIAGNOSIS — Y92009 Unspecified place in unspecified non-institutional (private) residence as the place of occurrence of the external cause: Secondary | ICD-10-CM | POA: Diagnosis not present

## 2023-07-06 DIAGNOSIS — G629 Polyneuropathy, unspecified: Secondary | ICD-10-CM | POA: Diagnosis not present

## 2023-07-06 DIAGNOSIS — W19XXXA Unspecified fall, initial encounter: Secondary | ICD-10-CM | POA: Diagnosis not present

## 2023-07-11 ENCOUNTER — Encounter: Payer: Self-pay | Admitting: Family Medicine

## 2023-07-11 ENCOUNTER — Ambulatory Visit: Payer: PPO | Admitting: Family Medicine

## 2023-07-11 ENCOUNTER — Ambulatory Visit
Admission: RE | Admit: 2023-07-11 | Discharge: 2023-07-11 | Disposition: A | Discharge status: Home / Self Care | Payer: PPO | Attending: Family Medicine | Admitting: Family Medicine

## 2023-07-11 ENCOUNTER — Ambulatory Visit
Admission: RE | Admit: 2023-07-11 | Discharge: 2023-07-11 | Disposition: A | Discharge status: Home / Self Care | Payer: PPO | Source: Ambulatory Visit | Attending: Family Medicine | Admitting: Family Medicine

## 2023-07-11 ENCOUNTER — Ambulatory Visit: Payer: Self-pay

## 2023-07-11 VITALS — BP 130/70 | HR 86 | Temp 98.0°F | Resp 16 | Ht 60.0 in | Wt 187.0 lb

## 2023-07-11 DIAGNOSIS — J4521 Mild intermittent asthma with (acute) exacerbation: Secondary | ICD-10-CM

## 2023-07-11 DIAGNOSIS — M40205 Unspecified kyphosis, thoracolumbar region: Secondary | ICD-10-CM | POA: Diagnosis not present

## 2023-07-11 DIAGNOSIS — R0602 Shortness of breath: Secondary | ICD-10-CM | POA: Diagnosis not present

## 2023-07-11 DIAGNOSIS — J069 Acute upper respiratory infection, unspecified: Secondary | ICD-10-CM | POA: Diagnosis not present

## 2023-07-11 DIAGNOSIS — R071 Chest pain on breathing: Secondary | ICD-10-CM | POA: Diagnosis not present

## 2023-07-11 DIAGNOSIS — Z981 Arthrodesis status: Secondary | ICD-10-CM | POA: Diagnosis not present

## 2023-07-11 DIAGNOSIS — R0781 Pleurodynia: Secondary | ICD-10-CM

## 2023-07-11 DIAGNOSIS — R059 Cough, unspecified: Secondary | ICD-10-CM | POA: Diagnosis not present

## 2023-07-11 MED ORDER — ALBUTEROL SULFATE HFA 108 (90 BASE) MCG/ACT IN AERS: 2.0000 | INHALATION_SPRAY | RESPIRATORY_TRACT | 0 refills | Status: DC | PRN

## 2023-07-11 MED ORDER — QVAR REDIHALER 80 MCG/ACT IN AERB: 2.0000 | INHALATION_SPRAY | Freq: Two times a day (BID) | RESPIRATORY_TRACT | 1 refills | Status: DC

## 2023-07-11 NOTE — Telephone Encounter (Signed)
 Chief Complaint: Cough w/thick, white sputum Symptoms: Pressure to lung area on the right back when coughing/taking a deep breath, SOB that is worse when moving around Frequency: Onset yesterday Pertinent Negatives: Patient denies fever, other symptoms, no chest pain Disposition: [] ED /[] Urgent Care (no appt availability in office) / [x] Appointment(In office/virtual)/ []  [facility]/ [] Home Care/ [] Refused Recommended Disposition /[] [facility]/ []  Follow-up with PCP Additional Notes: N/A   Reason for Disposition  [1] Continuous (nonstop) coughing interferes with work or school AND [2] no improvement using cough treatment per Care Advice  Answer Assessment - Initial Assessment Questions 1. ONSET: When did the cough begin?      Yesterday 2. SEVERITY: How bad is the cough today?      8/10 severity 3. SPUTUM: Describe the color of your sputum (none, dry cough; clear, white, yellow, green)     White color, thick 4. HEMOPTYSIS: Are you coughing up any blood? If so ask: How much? (flecks, streaks, tablespoons, etc.)     No 5. DIFFICULTY BREATHING: Are you having difficulty breathing? If Yes, ask: How bad is it? (e.g., mild, moderate, severe)    - MILD: No SOB at rest, mild SOB with walking, speaks normally in sentences, can lie down, no retractions, pulse < 100.    - MODERATE: SOB at rest, SOB with minimal exertion and prefers to sit, cannot lie down flat, speaks in phrases, mild retractions, audible wheezing, pulse 100-120.    - SEVERE: Very SOB at rest, speaks in single words, struggling to breathe, sitting hunched forward, retractions, pulse > 120      Mild-Moderate, unable to take a deep breath when sitting 6. FEVER: Do you have a fever? If Yes, ask: What is your temperature, how was it measured, and when did it start?     No 7. CARDIAC HISTORY: Do you have any history of heart disease? (e.g., heart attack, congestive heart failure)      No 8.  LUNG HISTORY: Do you have any history of lung disease?  (e.g., pulmonary embolus, asthma, emphysema)     Asthma 9. PE RISK FACTORS: Do you have a history of blood clots? (or: recent major surgery, recent prolonged travel, bedridden)     No 10. OTHER SYMPTOMS: Do you have any other symptoms? (e.g., runny nose, wheezing, chest pain)     Pressure in the back on the right side where lung is when move or take a deep breath  Protocols used: Cough - Acute Productive-A-AH

## 2023-07-11 NOTE — Progress Notes (Signed)
 Patient ID: Olivia Romero, female    DOB: 1955-10-02, 68 y.o.   MRN: [id number]  PCP: [name], PA-C  Chief Complaint  Patient presents with   Cough    Started yesterday, mostly non-productive   Ear Pain    Both ears, x1 day   Back Pain    Subjective:   Olivia Romero is a 68 y.o. female, presents to clinic with CC of the following:  HPI  She presents with cough x 1-2 days and just feeling awful She had cough/URI about 3 weeks ago and finally got over it and then starting having similar sx again in the last two days She is coughing so much her chest and back hurt, its hard to breath/feels like she cannot take a deep breath cause it makes her cough, she is SOB with exertion, and feels generally ill and fatigued She does have hx of pneumonia and asthma, doesn't currently have an inhaler No fever, sweats, chills, sore throat, sick contacts   Patient Active Problem List   Diagnosis Date Noted   Aortic atherosclerosis (HCC) 01/15/2023   History of diastolic dysfunction 04/18/2022   Mitral valve insufficiency 04/18/2022   Statin myopathy 11/23/2021   History of colitis 08/03/2021   Vitamin D  deficiency 07/12/2020   Chronic low back pain 07/12/2020   Sepsis without acute organ dysfunction (HCC) 12/29/2019   Cervical disc disorder 12/29/2019   Brachial radiculitis 12/29/2019   Allergy to statin medication 12/01/2019   History of below knee amputation, left (HCC) 07/30/2019   PAD (peripheral artery disease) (HCC) 12/05/2018   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) 09/18/2018   Class 2 severe obesity with serious comorbidity and body mass index (BMI) of 35.0 to 35.9 in adult (HCC) 11/01/2017   B12 deficiency 11/01/2017   Diabetic polyneuropathy associated with type 2 diabetes mellitus (HCC) 12/13/2015   Lumbar stenosis with neurogenic claudication 09/02/2015   Chronic constipation 07/13/2015   OSA (obstructive sleep apnea) 06/25/2015   Primary osteoarthritis  involving multiple joints 04/23/2015   Hyperlipidemia 04/23/2015   Statin intolerance 04/23/2015   Asthma, mild intermittent 02/01/2015   Type 2 diabetes mellitus with diabetic neuropathy, with long-term current use of insulin  (HCC) 12/31/2014   Gastroesophageal reflux disease with esophagitis 12/31/2014   Bilateral carpal tunnel syndrome 12/01/2014   Diverticulosis of colon 11/30/2012   Benign essential hypertension 11/28/2012      Current Outpatient Medications:    acetaminophen  (TYLENOL ) 500 MG tablet, Take 1,000 mg by mouth every 6 (six) hours as needed for mild pain. Maximum of 3,000 mg per day, Disp: , Rfl:    albuterol  (VENTOLIN  HFA) 108 (90 Base) MCG/ACT inhaler, INHALE 2 PUFFS INTO THE LUNGS EVERY 4 HOURS AS NEEDED, Disp: 6.7 g, Rfl: 0   aspirin  EC 325 MG tablet, Take 1 tablet (325 mg total) by mouth daily., Disp: 30 tablet, Rfl: 0   Blood Glucose Monitoring Suppl (CONTOUR NEXT MONITOR) w/Device KIT, as directed., Disp: , Rfl:    colchicine  0.6 MG tablet, Take 1 tablet (0.6 mg total) by mouth daily., Disp: 10 tablet, Rfl: 0   DULoxetine  (CYMBALTA ) 60 MG capsule, Take 120 mg by mouth at bedtime., Disp: , Rfl:    ezetimibe  (ZETIA ) 10 MG tablet, Take 1 tablet (10 mg total) by mouth daily., Disp: 90 tablet, Rfl: 1   fenofibrate  (TRICOR ) 145 MG tablet, Take 1 tablet (145 mg total) by mouth daily., Disp: 90 tablet, Rfl: 1   furosemide  (LASIX ) 20  MG tablet, Take 0.5-1 tablets (10-20 mg total) by mouth daily as needed. For severe fluid retention in lower extremity.  Take in am with potassium PRN, Disp: 10 tablet, Rfl: 0   gabapentin  (NEURONTIN ) 100 MG capsule, Take 100 mg by mouth at bedtime., Disp: , Rfl:    gabapentin  (NEURONTIN ) 800 MG tablet, Take 800 mg by mouth 2 (two) times daily., Disp: , Rfl:    glimepiride  (AMARYL ) 2 MG tablet, Take 2 mg by mouth 2 (two) times daily. , Disp: , Rfl:    hydrochlorothiazide  (HYDRODIURIL ) 25 MG tablet, Take 1 tablet (25 mg total) by mouth daily.,  Disp: 90 tablet, Rfl: 0   Incontinence Supply Disposable (ASSURANCE FITTED BRIEF LARGE) MISC, Use 1 brief up to 6 times a day as needed for urinary incontinence, Disp: 180 each, Rfl: 5   insulin  degludec (TRESIBA ) 200 UNIT/ML FlexTouch Pen, Inject 20 Units into the skin at bedtime. , Disp: , Rfl:    meclizine  (ANTIVERT ) 25 MG tablet, Take 0.5-1 tablets (12.5-25 mg total) by mouth 3 (three) times daily as needed for dizziness (vertigo sx)., Disp: 30 tablet, Rfl: 1   mesalamine (LIALDA) 1.2 g EC tablet, Take 1.2 g by mouth daily., Disp: , Rfl:    metFORMIN  (GLUCOPHAGE -XR) 500 MG 24 hr tablet, Take 500 mg by mouth 3 (three) times daily. , Disp: , Rfl:    Multiple Vitamins-Minerals (MULTIVITAMIN GUMMIES WOMENS PO), Take 1 tablet by mouth daily., Disp: , Rfl:    ondansetron  (ZOFRAN -ODT) 4 MG disintegrating tablet, Take 1 tablet (4 mg total) by mouth every 8 (eight) hours as needed for refractory nausea / vomiting., Disp: 10 tablet, Rfl: 0   pantoprazole  (PROTONIX ) 40 MG tablet, Take 1 tablet (40 mg total) by mouth daily., Disp: 90 tablet, Rfl: 1   polyethylene glycol (MIRALAX  / GLYCOLAX ) 17 g packet, Take 17 g by mouth every other day., Disp: , Rfl:    potassium chloride  SA (KLOR-CON  M) 20 MEQ tablet, Take 1 tablet (20 mEq total) by mouth daily as needed (take on days when taking lasix /furosemide )., Disp: 20 tablet, Rfl: 0   ULTICARE SHORT PEN NEEDLES 31G X 8 MM MISC, , Disp: , Rfl:    Allergies  Allergen Reactions   Egg-Derived Products Swelling and Other (See Comments)    Angioedema   Atorvastatin Rash    Rash itching    Tirzepatide  Other (See Comments)   Influenza Virus Vaccine Swelling    Arm swelled (site of injection)   Latex Itching   Pravastatin Itching and Rash   Tape Rash and Other (See Comments)    Adhesive on foot pull off skin.  Ok to use paper tape.     Social History   Tobacco Use   Smoking status: Never   Smokeless tobacco: Never  Vaping Use   Vaping status: Never Used   Substance Use Topics   Alcohol use: Never   Drug use: No      Chart Review Today: I personally reviewed active problem list, medication list, allergies, family history, social history, health maintenance, notes from last encounter, lab results, imaging with the patient/caregiver today.   Review of Systems  Constitutional: Negative.   HENT: Negative.    Eyes: Negative.   Respiratory: Negative.    Cardiovascular: Negative.   Gastrointestinal: Negative.   Endocrine: Negative.   Genitourinary: Negative.   Musculoskeletal: Negative.   Skin: Negative.   Allergic/Immunologic: Negative.   Neurological: Negative.   Hematological: Negative.   Psychiatric/Behavioral: Negative.  All other systems reviewed and are negative.      Objective:   Vitals:   07/11/23 1343  BP: 130/70  Pulse: 86  Resp: 16  Temp: 98 F (36.7 C)  SpO2: 97%  Weight: 187 lb (84.8 kg)  Height: 5' (1.524 m)    Body mass index is 36.52 kg/m.  Physical Exam Vitals and nursing note reviewed.  Constitutional:      Appearance: She is well-developed. She is obese. She is not ill-appearing or toxic-appearing.     Interventions: Face mask in place.  HENT:     Head: Normocephalic and atraumatic.     Right Ear: External ear normal.     Left Ear: External ear normal.  Eyes:     General:        Right eye: No discharge.        Left eye: No discharge.     Conjunctiva/sclera: Conjunctivae normal.  Neck:     Trachea: No tracheal deviation.  Cardiovascular:     Rate and Rhythm: Normal rate and regular rhythm.     Pulses: Normal pulses.     Heart sounds: Normal heart sounds.  Pulmonary:     Effort: Pulmonary effort is normal. No tachypnea, accessory muscle usage, respiratory distress or retractions.     Breath sounds: Normal breath sounds. Decreased air movement present. No stridor or transmitted upper airway sounds.     Comments: Coarse BS, poor inspiratory effort - splinting and coughing, more diminished  and coarse to right mid to right lower lung fields No wheeze heard Chest:     Chest wall: Tenderness (right side ribs ttp) present.  Musculoskeletal:        General: Normal range of motion.  Skin:    General: Skin is warm and dry.     Findings: No rash.  Neurological:     Mental Status: She is alert.     Motor: No abnormal muscle tone.     Coordination: Coordination normal.  Psychiatric:        Behavior: Behavior normal. Behavior is cooperative.      Results for orders placed or performed in visit on 01/15/23  Hemoglobin A1c   Collection Time: 12/14/22 12:00 AM  Result Value Ref Range   Hemoglobin A1C 7.3    *Note: Due to a large number of results and/or encounters for the requested time period, some results have not been displayed. A complete set of results can be found in Results Review.       Assessment & Plan:   1. Mild intermittent asthma with acute exacerbation (Primary) Will try adding ICS due to multiple exacerbations in the past month, use SABA - albuterol  (VENTOLIN  HFA) 108 (90 Base) MCG/ACT inhaler; Inhale 2 puffs into the lungs every 4 (four) hours as needed for wheezing or shortness of breath.  Dispense: 6.7 g; Refill: 0 - beclomethasone (QVAR  REDIHALER) 80 MCG/ACT inhaler; Inhale 2 puffs into the lungs 2 (two) times daily.  Dispense: 1 each; Refill: 1  2. Upper respiratory tract infection, unspecified type Several URI infections in the past 1-2 months, no sick contacts, no fever  3. Chest pain, pleuritic Pleuritic and chest wall pain - most to right mid to lower ribs/chest wall No obvious rales, but more diminished and coarse BS in that area and pt unable to take a full deep breath without splinting or coughing Will get CXR with hx of pneumonia and limited exam today Will order abx as indicated by imaging - DG  Chest 2 View  4. Shortness of breath Exertional and in general she feels like she cannot take a deep breath Possibly asthma sx contributing -  refilled inhalers, CXR - DG Chest 2 View  Close f/up if not improving in the next 1-2 weeks    Michelene Cower, PA-C 07/11/23 2:12 PM

## 2023-07-12 DIAGNOSIS — E538 Deficiency of other specified B group vitamins: Secondary | ICD-10-CM | POA: Diagnosis not present

## 2023-07-12 DIAGNOSIS — E11610 Type 2 diabetes mellitus with diabetic neuropathic arthropathy: Secondary | ICD-10-CM | POA: Diagnosis not present

## 2023-07-12 DIAGNOSIS — G629 Polyneuropathy, unspecified: Secondary | ICD-10-CM | POA: Diagnosis not present

## 2023-07-12 LAB — PROTEIN / CREATININE RATIO, URINE
Albumin, U: 91
Creatinine, Urine: 104.7

## 2023-07-12 LAB — BASIC METABOLIC PANEL
BUN: 17 (ref 4–21)
CO2: 29 — AB (ref 13–22)
Chloride: 97 — AB (ref 99–108)
Creatinine: 0.8 (ref 0.5–1.1)
Glucose: 182
Potassium: 5.4 meq/L — AB (ref 3.5–5.1)
Sodium: 135 — AB (ref 137–147)

## 2023-07-12 LAB — LIPID PANEL
Cholesterol: 137 (ref 0–200)
HDL: 37 (ref 35–70)
LDL Cholesterol: 62
Triglycerides: 192 — AB (ref 40–160)

## 2023-07-12 LAB — HEMOGLOBIN A1C: Hemoglobin A1C: 8.7

## 2023-07-12 LAB — COMPREHENSIVE METABOLIC PANEL
Calcium: 9.2 (ref 8.7–10.7)
eGFR: 82

## 2023-07-12 LAB — MICROALBUMIN / CREATININE URINE RATIO: Microalb Creat Ratio: 86.9

## 2023-07-16 ENCOUNTER — Telehealth: Payer: Self-pay

## 2023-07-16 NOTE — Telephone Encounter (Signed)
 [employer] auth APPROVAL auth #[id number] start date 2/01-10/05/23

## 2023-07-17 ENCOUNTER — Telehealth: Payer: Self-pay

## 2023-07-17 ENCOUNTER — Ambulatory Visit: Payer: PPO | Admitting: Physician Assistant

## 2023-07-17 NOTE — Telephone Encounter (Signed)
LM for patient to please call and schedule casting appt (30 min) for new AFO brace in orthotic department

## 2023-07-18 ENCOUNTER — Ambulatory Visit: Payer: PPO | Admitting: Nurse Practitioner

## 2023-07-18 ENCOUNTER — Encounter: Payer: Self-pay | Admitting: Nurse Practitioner

## 2023-07-18 ENCOUNTER — Ambulatory Visit: Payer: HMO | Admitting: Family Medicine

## 2023-07-18 VITALS — BP 122/76 | HR 107 | Temp 98.3°F | Resp 18 | Ht 60.0 in

## 2023-07-18 DIAGNOSIS — J069 Acute upper respiratory infection, unspecified: Secondary | ICD-10-CM

## 2023-07-18 DIAGNOSIS — E785 Hyperlipidemia, unspecified: Secondary | ICD-10-CM

## 2023-07-18 DIAGNOSIS — R053 Chronic cough: Secondary | ICD-10-CM | POA: Diagnosis not present

## 2023-07-18 DIAGNOSIS — E781 Pure hyperglyceridemia: Secondary | ICD-10-CM

## 2023-07-18 DIAGNOSIS — J4521 Mild intermittent asthma with (acute) exacerbation: Secondary | ICD-10-CM | POA: Diagnosis not present

## 2023-07-18 DIAGNOSIS — I10 Essential (primary) hypertension: Secondary | ICD-10-CM

## 2023-07-18 DIAGNOSIS — K2100 Gastro-esophageal reflux disease with esophagitis, without bleeding: Secondary | ICD-10-CM

## 2023-07-18 DIAGNOSIS — Z794 Long term (current) use of insulin: Secondary | ICD-10-CM

## 2023-07-18 MED ORDER — PREDNISONE 10 MG (21) PO TBPK
ORAL_TABLET | ORAL | 0 refills | Status: DC
Start: 2023-07-18 — End: 2024-02-21

## 2023-07-18 MED ORDER — BENZONATATE 100 MG PO CAPS
200.0000 mg | ORAL_CAPSULE | Freq: Three times a day (TID) | ORAL | 0 refills | Status: DC | PRN
Start: 2023-07-18 — End: 2024-03-26

## 2023-07-18 MED ORDER — AZITHROMYCIN 250 MG PO TABS: ORAL_TABLET | ORAL | 0 refills | Status: AC

## 2023-07-18 NOTE — Progress Notes (Signed)
BP 122/76   Pulse (!) 107   Temp 98.3 F (36.8 C)   Resp 18   Ht 5' (1.524 m)   LMP  (LMP Unknown)   SpO2 96%   BMI 36.52 kg/m    Subjective:    Patient ID: Olivia Romero, female    DOB: 05-15-1956, 68 y.o.   MRN: [id number]  HPI: Olivia Romero is a 68 y.o. female  Chief Complaint  Patient presents with   URI    Patient states better but still has cough    Discussed the use of AI scribe software for clinical note transcription with the patient, who gave verbal consent to proceed.  History of Present Illness   The patient, with a history of diabetes, presents with a persistent cough that started last Monday. The cough has been productive with yellow sputum. recently had a negative chest xray. She has been using a Qvar and albuterol  inhaler prescribed last week, but it has not alleviated the cough. She has also tried Delsym, which seemed to slightly improve the cough. The patient reports difficulty sleeping due to the persistent cough and associated body aches. She also mentions that her back pain has worsened since last week. The patient's diabetes has been fluctuating recently, with blood sugar levels ranging from 67 to 200.       01/15/2023    2:17 PM 12/18/2022    8:26 AM 10/27/2022    9:17 AM  Depression screen PHQ 2/9  Decreased Interest 0 0 0  Down, Depressed, Hopeless 0 0 0  PHQ - 2 Score 0 0 0  Altered sleeping 0 0 0  Tired, decreased energy 0 0 0  Change in appetite 0 0 0  Feeling bad or failure about yourself  0 0 0  Trouble concentrating 0 0 0  Moving slowly or fidgety/restless 0 0 0  Suicidal thoughts 0 0 0  PHQ-9 Score 0 0 0  Difficult doing work/chores Not difficult at all Not difficult at all Not difficult at all    Relevant past medical, surgical, family and social history reviewed and updated as indicated. Interim medical history since our last visit reviewed. Allergies and medications reviewed and updated.  Review of Systems  Ten systems  reviewed and is negative except as mentioned in HPI      Objective:    BP 122/76   Pulse (!) 107   Temp 98.3 F (36.8 C)   Resp 18   Ht 5' (1.524 m)   LMP  (LMP Unknown)   SpO2 96%   BMI 36.52 kg/m    Wt Readings from Last 3 Encounters:  07/11/23 187 lb (84.8 kg)  01/15/23 183 lb 14.4 oz (83.4 kg)  12/18/22 180 lb 5.4 oz (81.8 kg)    Physical Exam Vitals reviewed.  Constitutional:      Appearance: Normal appearance.  HENT:     Head: Normocephalic.     Right Ear: Tympanic membrane normal.     Left Ear: Tympanic membrane normal.     Nose: Nose normal.     Right Sinus: No maxillary sinus tenderness or frontal sinus tenderness.     Left Sinus: No maxillary sinus tenderness or frontal sinus tenderness.     Mouth/Throat:     Mouth: Mucous membranes are moist.     Pharynx: Oropharynx is clear. No oropharyngeal exudate or posterior oropharyngeal erythema.  Eyes:     Extraocular Movements: Extraocular movements intact.     Conjunctiva/sclera:  Conjunctivae normal.     Pupils: Pupils are equal, round, and reactive to light.  Cardiovascular:     Rate and Rhythm: Normal rate and regular rhythm.  Pulmonary:     Effort: Pulmonary effort is normal.     Breath sounds: Rhonchi present.  Lymphadenopathy:     Cervical: No cervical adenopathy.  Skin:    General: Skin is warm and dry.  Neurological:     General: No focal deficit present.     Mental Status: She is alert and oriented to person, place, and time. Mental status is at baseline.  Psychiatric:        Mood and Affect: Mood normal.        Behavior: Behavior normal.        Thought Content: Thought content normal.        Judgment: Judgment normal.     Results for orders placed or performed in visit on 07/17/23  Microalbumin / creatinine urine ratio   Collection Time: 07/12/23 12:00 AM  Result Value Ref Range   Microalb Creat Ratio 86.9   Protein / creatinine ratio, urine   Collection Time: 07/12/23 12:00 AM  Result  Value Ref Range   Creatinine, Urine 104.7    Albumin, U 91   Basic metabolic panel   Collection Time: 07/12/23 12:00 AM  Result Value Ref Range   Glucose 182    BUN 17 4 - 21   CO2 29 (A) 13 - 22   Creatinine 0.8 0.5 - 1.1   Potassium 5.4 (A) 3.5 - 5.1 mEq/L   Sodium 135 (A) 137 - 147   Chloride 97 (A) 99 - 108  Comprehensive metabolic panel   Collection Time: 07/12/23 12:00 AM  Result Value Ref Range   eGFR 82    Calcium 9.2 8.7 - 10.7  Lipid panel   Collection Time: 07/12/23 12:00 AM  Result Value Ref Range   Triglycerides 192 (A) 40 - 160   Cholesterol 137 0 - 200   HDL 37 35 - 70   LDL Cholesterol 62   Hemoglobin A1c   Collection Time: 07/12/23 12:00 AM  Result Value Ref Range   Hemoglobin A1C 8.7    *Note: Due to a large number of results and/or encounters for the requested time period, some results have not been displayed. A complete set of results can be found in Results Review.       Assessment & Plan:   Problem List Items Addressed This Visit       Respiratory   Asthma, mild intermittent - Primary   Relevant Medications   benzonatate (TESSALON PERLES) 100 MG capsule   predniSONE (STERAPRED UNI-PAK 21 TAB) 10 MG (21) TBPK tablet   azithromycin (ZITHROMAX) 250 MG tablet   Other Visit Diagnoses       Viral upper respiratory tract infection       Relevant Medications   benzonatate (TESSALON PERLES) 100 MG capsule   predniSONE (STERAPRED UNI-PAK 21 TAB) 10 MG (21) TBPK tablet   azithromycin (ZITHROMAX) 250 MG tablet     Cough, persistent       Relevant Medications   benzonatate (TESSALON PERLES) 100 MG capsule   predniSONE (STERAPRED UNI-PAK 21 TAB) 10 MG (21) TBPK tablet   azithromycin (ZITHROMAX) 250 MG tablet        Assessment and Plan    Upper Respiratory Infection Persistent cough with yellow sputum. No improvement with steroid inhaler and Delsym. -Continue Delsym. -Prescribe cough medicine to help  with the tickle that initiates coughing.  tessalon perls -Prescribe Prednisone, with caution that it may increase blood sugar levels. -Prescribe Azithromycin as an antibiotic. -Patient to send a message in a few days to update on condition.  Diabetes Blood sugar levels fluctuating, with recent readings ranging from 67 to 200. Anticipate higher levels due to prescribed Prednisone. -Monitor blood sugar levels closely while on Prednisone.        Follow up plan: Return if symptoms worsen or fail to improve.

## 2023-07-23 DIAGNOSIS — Z794 Long term (current) use of insulin: Secondary | ICD-10-CM | POA: Diagnosis not present

## 2023-07-23 DIAGNOSIS — E1165 Type 2 diabetes mellitus with hyperglycemia: Secondary | ICD-10-CM | POA: Diagnosis not present

## 2023-07-23 DIAGNOSIS — Z89512 Acquired absence of left leg below knee: Secondary | ICD-10-CM | POA: Diagnosis not present

## 2023-07-23 DIAGNOSIS — E1129 Type 2 diabetes mellitus with other diabetic kidney complication: Secondary | ICD-10-CM | POA: Diagnosis not present

## 2023-07-23 DIAGNOSIS — E1142 Type 2 diabetes mellitus with diabetic polyneuropathy: Secondary | ICD-10-CM | POA: Diagnosis not present

## 2023-07-23 DIAGNOSIS — E875 Hyperkalemia: Secondary | ICD-10-CM | POA: Diagnosis not present

## 2023-07-23 DIAGNOSIS — E113293 Type 2 diabetes mellitus with mild nonproliferative diabetic retinopathy without macular edema, bilateral: Secondary | ICD-10-CM | POA: Diagnosis not present

## 2023-07-23 DIAGNOSIS — E559 Vitamin D deficiency, unspecified: Secondary | ICD-10-CM | POA: Diagnosis not present

## 2023-07-23 DIAGNOSIS — E781 Pure hyperglyceridemia: Secondary | ICD-10-CM | POA: Diagnosis not present

## 2023-07-23 DIAGNOSIS — Z789 Other specified health status: Secondary | ICD-10-CM | POA: Diagnosis not present

## 2023-07-23 DIAGNOSIS — R809 Proteinuria, unspecified: Secondary | ICD-10-CM | POA: Diagnosis not present

## 2023-07-24 ENCOUNTER — Other Ambulatory Visit: Payer: Self-pay

## 2023-07-24 ENCOUNTER — Emergency Department: Payer: PPO

## 2023-07-24 ENCOUNTER — Ambulatory Visit
Admission: EM | Admit: 2023-07-24 | Discharge: 2023-07-24 | Disposition: A | Discharge status: Home / Self Care | Payer: PPO | Attending: Emergency Medicine | Admitting: Emergency Medicine

## 2023-07-24 ENCOUNTER — Ambulatory Visit: Payer: Self-pay

## 2023-07-24 ENCOUNTER — Emergency Department
Admission: EM | Admit: 2023-07-24 | Discharge: 2023-07-25 | Disposition: A | Discharge status: Home / Self Care | Payer: PPO | Attending: Emergency Medicine | Admitting: Emergency Medicine

## 2023-07-24 DIAGNOSIS — R059 Cough, unspecified: Secondary | ICD-10-CM | POA: Insufficient documentation

## 2023-07-24 DIAGNOSIS — R0789 Other chest pain: Secondary | ICD-10-CM | POA: Diagnosis not present

## 2023-07-24 DIAGNOSIS — R739 Hyperglycemia, unspecified: Secondary | ICD-10-CM

## 2023-07-24 DIAGNOSIS — R079 Chest pain, unspecified: Secondary | ICD-10-CM

## 2023-07-24 DIAGNOSIS — I10 Essential (primary) hypertension: Secondary | ICD-10-CM | POA: Insufficient documentation

## 2023-07-24 DIAGNOSIS — J45909 Unspecified asthma, uncomplicated: Secondary | ICD-10-CM | POA: Diagnosis not present

## 2023-07-24 DIAGNOSIS — R0602 Shortness of breath: Secondary | ICD-10-CM

## 2023-07-24 DIAGNOSIS — E1165 Type 2 diabetes mellitus with hyperglycemia: Secondary | ICD-10-CM | POA: Insufficient documentation

## 2023-07-24 DIAGNOSIS — Z79899 Other long term (current) drug therapy: Secondary | ICD-10-CM | POA: Insufficient documentation

## 2023-07-24 LAB — CBC
HCT: 35.2 % — ABNORMAL LOW (ref 36.0–46.0)
Hemoglobin: 12.2 g/dL (ref 12.0–15.0)
MCH: 27.9 pg (ref 26.0–34.0)
MCHC: 34.7 g/dL (ref 30.0–36.0)
MCV: 80.5 fL (ref 80.0–100.0)
Platelets: 489 10*3/uL — ABNORMAL HIGH (ref 150–400)
RBC: 4.37 MIL/uL (ref 3.87–5.11)
RDW: 14.6 % (ref 11.5–15.5)
WBC: 12.2 10*3/uL — ABNORMAL HIGH (ref 4.0–10.5)
nRBC: 0 % (ref 0.0–0.2)

## 2023-07-24 LAB — URINALYSIS, W/ REFLEX TO CULTURE (INFECTION SUSPECTED)
Bacteria, UA: NONE SEEN
Bilirubin Urine: NEGATIVE
Glucose, UA: >=500 mg/dL — AB
Hgb urine dipstick: NEGATIVE
Ketones, ur: NEGATIVE mg/dL
Leukocytes,Ua: NEGATIVE
Nitrite: NEGATIVE
Protein, ur: NEGATIVE mg/dL
Specific Gravity, Urine: 1.018 (ref 1.005–1.030)
Squamous Epithelial / HPF: 0 /[HPF] (ref 0–5)
pH: 5 (ref 5.0–8.0)

## 2023-07-24 LAB — BLOOD GAS, VENOUS
Acid-Base Excess: 0.5 mmol/L (ref 0.0–2.0)
Bicarbonate: 24.6 mmol/L (ref 20.0–28.0)
O2 Saturation: 96.6 %
Patient temperature: 37
pCO2, Ven: 37 mm[Hg] — ABNORMAL LOW (ref 44–60)
pH, Ven: 7.43 (ref 7.25–7.43)
pO2, Ven: 85 mm[Hg] — ABNORMAL HIGH (ref 32–45)

## 2023-07-24 LAB — BASIC METABOLIC PANEL
Anion gap: 19 — ABNORMAL HIGH (ref 5–15)
BUN: 25 mg/dL — ABNORMAL HIGH (ref 8–23)
CO2: 15 mmol/L — ABNORMAL LOW (ref 22–32)
Calcium: 9.2 mg/dL (ref 8.9–10.3)
Chloride: 101 mmol/L (ref 98–111)
Creatinine, Ser: 0.88 mg/dL (ref 0.44–1.00)
GFR, Estimated: >60 mL/min (ref >60)
Glucose, Bld: 449 mg/dL — ABNORMAL HIGH (ref 70–99)
Potassium: 5.3 mmol/L — ABNORMAL HIGH (ref 3.5–5.1)
Sodium: 135 mmol/L (ref 135–145)

## 2023-07-24 LAB — TROPONIN I (HIGH SENSITIVITY): Troponin I (High Sensitivity): 12 ng/L (ref <18)

## 2023-07-24 LAB — BETA-HYDROXYBUTYRIC ACID: Beta-Hydroxybutyric Acid: 0.16 mmol/L (ref 0.05–0.27)

## 2023-07-24 LAB — CBG MONITORING, ED: Glucose-Capillary: 223 mg/dL — ABNORMAL HIGH (ref 70–99)

## 2023-07-24 LAB — RESP PANEL BY RT-PCR (RSV, FLU A&B, COVID)  RVPGX2
Influenza A by PCR: NEGATIVE
Influenza B by PCR: NEGATIVE
Resp Syncytial Virus by PCR: NEGATIVE
SARS Coronavirus 2 by RT PCR: NEGATIVE

## 2023-07-24 MED ORDER — INSULIN ASPART 100 UNIT/ML IJ SOLN: 8.0000 [IU] | Freq: Once | INTRAMUSCULAR | Status: DC

## 2023-07-24 MED ORDER — LACTATED RINGERS IV BOLUS
1000.0000 mL | Freq: Once | INTRAVENOUS | Status: AC
  Administered 2023-07-24: 1000 mL via INTRAVENOUS

## 2023-07-24 NOTE — Discharge Instructions (Addendum)
Go to the emergency department for evaluation of your chest pain and shortness of breath.

## 2023-07-24 NOTE — Telephone Encounter (Signed)
  Chief Complaint: SOB Symptoms: heaviness and tightness in chest, mild SOB, cough and congestion, pt states mucus has turned thick yellow Frequency: URI x 3 weeks but SOB since Sunday Pertinent Negatives: NA Disposition: [] ED /[] Urgent Care (no appt availability in office) / [] Appointment(In office/virtual)/ []  [facility]/ [] Home Care/ [] Refused Recommended Disposition /[] [facility]/ [x]  Follow-up with PCP Additional Notes: pt had OV on 07/11/23 and 07/18/23 for URI, pt states she doesn't feel like she is getting better. Has now developed heaviness and tightness in chest, SOB at times. Used Albuterol inhaler 2 hours ago and didn't get much relief. Pt states she is concerned at this point. Thought about going to ED but pt doesn't want to go there unless absolutely necceasry. Looked at appts, none available for today, called [facility] Loser, [facility] who states they dont have anything available to send HP message back and she will route to provider. Advised pt of this and also advised [facility] didn't have long wait time if she wanted to go there instead. Pt states she prefers to come to PCP office if possible. Advised I would send message back and have nurse FU asap. Also recommended pt use albuterol inhaler as prescribed q4h and Qvar inhaler as well. Pt verbalized understanding, advised pt if she hasn't heard back by 3pm to call back.    Reason for Disposition  [1] MILD difficulty breathing (e.g., minimal/no SOB at rest, SOB with walking, pulse <100) AND [2] NEW-onset or WORSE than normal  Answer Assessment - Initial Assessment Questions 1. RESPIRATORY STATUS: "Describe your breathing?" (e.g., wheezing, shortness of breath, unable to speak, severe coughing)      Heaviness and tightness in chest  2. ONSET: "When did this breathing problem begin?"      Sunday  3. PATTERN "Does the difficult breathing come and go, or has it been constant since it started?"      Constant  4. SEVERITY: "How  bad is your breathing?" (e.g., mild, moderate, severe)    - MILD: No SOB at rest, mild SOB with walking, speaks normally in sentences, can lie down, no retractions, pulse < 100.    - MODERATE: SOB at rest, SOB with minimal exertion and prefers to sit, cannot lie down flat, speaks in phrases, mild retractions, audible wheezing, pulse 100-120.    - SEVERE: Very SOB at rest, speaks in single words, struggling to breathe, sitting hunched forward, retractions, pulse > 120      Mild to moderate  7. LUNG HISTORY: "Do you have any history of lung disease?"  (e.g., pulmonary embolus, asthma, emphysema)     Asthma 8. CAUSE: "What do you think is causing the breathing problem?"      URI  9. OTHER SYMPTOMS: "Do you have any other symptoms? (e.g., dizziness, runny nose, cough, chest pain, fever)     Still have congestion and cough  Protocols used: Breathing Difficulty-A-AH

## 2023-07-24 NOTE — ED Provider Notes (Signed)
Renaldo Fiddler    CSN: [id number] Arrival date & time: 07/24/23  1422      History   Chief Complaint Chief Complaint  Patient presents with   Cough   Shortness of Breath    HPI Olivia Romero is a 68 y.o. female.  Patient presents with 3-week history of cough, congestion, shortness of breath.  Her symptoms are worse in the last 2 days.  She feels "heaviness" in her chest.  The heaviness is worse with coughing but present constantly.  No fever.  She used her albuterol inhaler 30 minutes PTA.  Patient was seen by her PCP on 07/11/2023; diagnosed with asthma exacerbation; treated with albuterol inhaler and QVAR inhaler; chest x-ray showed no acute disease and mild hyperinflation.  She was seen by her PCP again on 07/18/2023; treated with Zithromax, prednisone, Tessalon Perles.  The history is provided by the patient and medical records.    Past Medical History:  Diagnosis Date   Anemia    Arthritis    Asthma    Colon polyps    adenomatous   Diabetes mellitus without complication (HCC)    Diabetic infection of left foot (HCC) 12/2018   Diverticulosis of colon    Dysphagia 12/31/2014   Esophagitis    GERD (gastroesophageal reflux disease)    Hemorrhoid    internal   Hyperlipemia    Hypertension    IBS (irritable bowel syndrome)    no current prob - diet controlled   MRSA bacteremia 12/05/2018   Myalgia due to statin 11/19/2017   Neuropathy    Neuropathy of both feet    Neuropathy of hand    Pneumonia    x 4   PONV (postoperative nausea and vomiting)    on some surgeries but not all procedures   Skin ulcer of right ankle, limited to breakdown of skin (HCC) 12/31/2016   resolved per patient 05/14/19   Sleep apnea    has had in the past lost 50 pounds and do longer uses cpap   Statin intolerance 04/23/2015   Stroke (HCC) 2009   mini stroke per patient   Stroke-like episode 2009   TIA - mini stroke per patient   Uncontrolled type 2 diabetes mellitus with  gastroparesis 07/13/2015   Uncontrolled type 2 diabetes mellitus with hyperglycemia (HCC) 11/01/2017    Patient Active Problem List   Diagnosis Date Noted   Aortic atherosclerosis (HCC) 01/15/2023   History of diastolic dysfunction 04/18/2022   Mitral valve insufficiency 04/18/2022   Statin myopathy 11/23/2021   History of colitis 08/03/2021   Vitamin D deficiency 07/12/2020   Chronic low back pain 07/12/2020   Sepsis without acute organ dysfunction (HCC) 12/29/2019   Cervical disc disorder 12/29/2019   Brachial radiculitis 12/29/2019   Allergy to statin medication 12/01/2019   History of below knee amputation, left (HCC) 07/30/2019   PAD (peripheral artery disease) (HCC) 12/05/2018   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) 09/18/2018   Class 2 severe obesity with serious comorbidity and body mass index (BMI) of 35.0 to 35.9 in adult (HCC) 11/01/2017   B12 deficiency 11/01/2017   Diabetic polyneuropathy associated with type 2 diabetes mellitus (HCC) 12/13/2015   Lumbar stenosis with neurogenic claudication 09/02/2015   Chronic constipation 07/13/2015   OSA (obstructive sleep apnea) 06/25/2015   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/23/2015   Hyperlipidemia 04/23/2015   Statin intolerance 04/23/2015   Asthma, mild intermittent 02/01/2015   Type 2 diabetes mellitus with  diabetic neuropathy, with long-term current use of insulin (HCC) 12/31/2014   Gastroesophageal reflux disease with esophagitis 12/31/2014   Bilateral carpal tunnel syndrome 12/01/2014   Diverticulosis of colon 11/30/2012   Benign essential hypertension 11/28/2012    Past Surgical History:  Procedure Laterality Date   ABDOMINAL HYSTERECTOMY     AMPUTATION Left 12/06/2018   Procedure: PARTIAL AMPUTATION LEFT FOOT;  Surgeon: Felecia Shelling, DPM;  Location: [facility] OR;  Service: Podiatry;  Laterality: Left;   AMPUTATION Left 05/16/2019   Procedure: LEFT BELOW KNEE AMPUTATION;  Surgeon: Nadara Mustard, MD;  Location: [facility] OR;  Service: Orthopedics;  Laterality: Left;   APPENDECTOMY     removed in 11th grade   BACK SURGERY  09/02/2015   BONE BIOPSY Left 12/06/2018   Procedure: Bone Biopsy;  Surgeon: Felecia Shelling, DPM;  Location: [facility] OR;  Service: Podiatry;  Laterality: Left;   CERVICAL FUSION     CESAREAN SECTION  1986   COLONOSCOPY N/A 11/30/2012   Procedure: COLONOSCOPY;  Surgeon: Hilarie Fredrickson, MD;  Location: [facility] ENDOSCOPY;  Service: Endoscopy;  Laterality: N/A;   COLONOSCOPY N/A 09/05/2021   Procedure: COLONOSCOPY;  Surgeon: Regis Bill, MD;  Location: [facility] ENDOSCOPY;  Service: Endoscopy;  Laterality: N/A;   COLONOSCOPY WITH PROPOFOL N/A 01/03/2016   Procedure: COLONOSCOPY WITH PROPOFOL;  Surgeon: Scot Jun, MD;  Location: [facility] ENDOSCOPY;  Service: Endoscopy;  Laterality: N/A;   ESOPHAGOGASTRODUODENOSCOPY N/A 09/05/2021   Procedure: ESOPHAGOGASTRODUODENOSCOPY (EGD);  Surgeon: Regis Bill, MD;  Location: [facility] ENDOSCOPY;  Service: Endoscopy;  Laterality: N/A;   ESOPHAGOGASTRODUODENOSCOPY (EGD) WITH PROPOFOL N/A 01/14/2015   Procedure: ESOPHAGOGASTRODUODENOSCOPY (EGD) WITH PROPOFOL;  Surgeon: Scot Jun, MD;  Location: [facility] ENDOSCOPY;  Service: Endoscopy;  Laterality: N/A;   IRRIGATION AND DEBRIDEMENT FOOT Left 03/13/2018   Procedure: IRRIGATION AND DEBRIDEMENT FOOT WITH BONE BIOPSY WITH MISONIX DEBRIDER;  Surgeon: Park Liter, DPM;  Location: [facility] OR;  Service: Podiatry;  Laterality: Left;   IRRIGATION AND DEBRIDEMENT FOOT Left 12/06/2018   Procedure: Irrigation And Debridement Foot;  Surgeon: Felecia Shelling, DPM;  Location: [facility] OR;  Service: Podiatry;  Laterality: Left;   LUMBAR WOUND DEBRIDEMENT N/A 10/01/2015   Procedure: LUMBAR WOUND DEBRIDEMENT;  Surgeon: Coletta Memos, MD;  Location: [facility] NEURO ORS;  Service: Neurosurgery;  Laterality: N/A;  LUMBAR WOUND DEBRIDEMENT   NASAL SINUS SURGERY     SAVORY DILATION N/A 01/14/2015   Procedure: SAVORY DILATION;   Surgeon: Scot Jun, MD;  Location: [facility] ENDOSCOPY;  Service: Endoscopy;  Laterality: N/A;   TONSILLECTOMY     WISDOM TOOTH EXTRACTION      OB History   No obstetric history on file.      Home Medications    Prior to Admission medications   Medication Sig Start Date End Date Taking? Authorizing Provider  acetaminophen (TYLENOL) 500 MG tablet Take 1,000 mg by mouth every 6 (six) hours as needed for mild pain. Maximum of 3,000 mg per day 09/17/18   Kerman Passey, MD  albuterol (VENTOLIN HFA) 108 (90 Base) MCG/ACT inhaler Inhale 2 puffs into the lungs every 4 (four) hours as needed for wheezing or shortness of breath. 07/11/23   Danelle Berry, PA-C  aspirin EC 325 MG tablet Take 1 tablet (325 mg total) by mouth daily. 03/11/18   Alba Cory, MD  beclomethasone (QVAR REDIHALER) 80 MCG/ACT inhaler Inhale 2 puffs into the lungs 2 (two) times daily. 07/11/23   Danelle Berry, PA-C  benzonatate (TESSALON  PERLES) 100 MG capsule Take 2 capsules (200 mg total) by mouth 3 (three) times daily as needed for cough. 07/18/23   Berniece Salines, FNP  Blood Glucose Monitoring Suppl (CONTOUR NEXT MONITOR) w/Device KIT as directed. 06/29/17   [provider]  colchicine 0.6 MG tablet Take 1 tablet (0.6 mg total) by mouth daily. 06/28/23   Vivi Barrack, DPM  DULoxetine (CYMBALTA) 60 MG capsule Take 120 mg by mouth at bedtime. 07/01/20   [provider]  ezetimibe (ZETIA) 10 MG tablet Take 1 tablet (10 mg total) by mouth daily. 01/22/23   Danelle Berry, PA-C  fenofibrate (TRICOR) 145 MG tablet Take 1 tablet (145 mg total) by mouth daily. 01/22/23   Danelle Berry, PA-C  furosemide (LASIX) 20 MG tablet Take 0.5-1 tablets (10-20 mg total) by mouth daily as needed. For severe fluid retention in lower extremity.  Take in am with potassium PRN 09/29/22   Danelle Berry, PA-C  gabapentin (NEURONTIN) 100 MG capsule Take 100 mg by mouth at bedtime. 05/19/20   [provider]  gabapentin  (NEURONTIN) 800 MG tablet Take 800 mg by mouth 2 (two) times daily. 09/16/21   [provider]  glimepiride (AMARYL) 2 MG tablet Take 2 mg by mouth 2 (two) times daily.  10/29/15   [provider]  hydrochlorothiazide (HYDRODIURIL) 25 MG tablet Take 1 tablet (25 mg total) by mouth daily. 01/22/23   Danelle Berry, PA-C  Incontinence Supply Disposable (ASSURANCE FITTED BRIEF LARGE) MISC Use 1 brief up to 6 times a day as needed for urinary incontinence 10/16/19   Danelle Berry, PA-C  insulin degludec (TRESIBA) 200 UNIT/ML FlexTouch Pen Inject 20 Units into the skin at bedtime.     [provider]  meclizine (ANTIVERT) 25 MG tablet Take 0.5-1 tablets (12.5-25 mg total) by mouth 3 (three) times daily as needed for dizziness (vertigo sx). 12/18/22   Danelle Berry, PA-C  mesalamine (LIALDA) 1.2 g EC tablet Take 1.2 g by mouth daily. 09/14/21   [provider]  metFORMIN (GLUCOPHAGE-XR) 500 MG 24 hr tablet Take 500 mg by mouth 3 (three) times daily.  01/01/19   [provider]  Multiple Vitamins-Minerals (MULTIVITAMIN GUMMIES WOMENS PO) Take 1 tablet by mouth daily.    [provider]  ondansetron (ZOFRAN-ODT) 4 MG disintegrating tablet Take 1 tablet (4 mg total) by mouth every 8 (eight) hours as needed for refractory nausea / vomiting. 12/18/22   Danelle Berry, PA-C  pantoprazole (PROTONIX) 40 MG tablet Take 1 tablet (40 mg total) by mouth daily. 01/15/23   Danelle Berry, PA-C  polyethylene glycol (MIRALAX / GLYCOLAX) 17 g packet Take 17 g by mouth every other day.    [provider]  potassium chloride SA (KLOR-CON M) 20 MEQ tablet Take 1 tablet (20 mEq total) by mouth daily as needed (take on days when taking lasix/furosemide). 04/20/22   Danelle Berry, PA-C  predniSONE (STERAPRED UNI-PAK 21 TAB) 10 MG (21) TBPK tablet Take as directed on package.  (60 mg po on day 1, 50 mg po on day 2...) Patient not taking: Reported on 07/24/2023 07/18/23   Berniece Salines,  FNP  Stann Ore SHORT PEN NEEDLES 31G X 8 MM MISC  01/09/19   [provider]    Family History Family History  Problem Relation Age of Onset   Lung cancer Father    Hypertension Father    Arthritis Father    Other Mother  hardening of the arteries/renal cell carcenoma   Hypertension Mother    Stroke Mother    Kidney cancer Mother    Arthritis Brother    Rheum arthritis Maternal Uncle    Bladder Cancer Neg Hx     Social History Social History   Tobacco Use   Smoking status: Never   Smokeless tobacco: Never  Vaping Use   Vaping status: Never Used  Substance Use Topics   Alcohol use: Never   Drug use: No     Allergies   Egg-derived products, Atorvastatin, Tirzepatide, Influenza virus vaccine, Latex, Pravastatin, and Tape   Review of Systems Review of Systems  Constitutional:  Negative for chills and fever.  HENT:  Positive for congestion. Negative for ear pain and sore throat.   Respiratory:  Positive for cough and shortness of breath.   Cardiovascular:  Positive for chest pain. Negative for palpitations.     Physical Exam Triage Vital Signs ED Triage Vitals  Encounter Vitals Group     BP 07/24/23 1432 138/77     Systolic BP Percentile --      Diastolic BP Percentile --      Pulse Rate 07/24/23 1432 95     Resp 07/24/23 1432 (!) 24     Temp 07/24/23 1432 98 F (36.7 C)     Temp src --      SpO2 07/24/23 1432 97 %     Weight --      Height --      Head Circumference --      Peak Flow --      Pain Score 07/24/23 1428 2     Pain Loc --      Pain Education --      Exclude from Growth Chart --    No data found.  Updated Vital Signs BP 138/77   Pulse 95   Temp 98 F (36.7 C)   Resp (!) 24   LMP  (LMP Unknown)   SpO2 97%   Visual Acuity Right Eye Distance:   Left Eye Distance:   Bilateral Distance:    Right Eye Near:   Left Eye Near:    Bilateral Near:     Physical Exam Constitutional:      General: She is not in acute  distress.    Appearance: She is ill-appearing.  HENT:     Right Ear: Tympanic membrane normal.     Left Ear: Tympanic membrane normal.     Nose: Nose normal.     Mouth/Throat:     Mouth: Mucous membranes are moist.     Pharynx: Oropharynx is clear.  Cardiovascular:     Rate and Rhythm: Normal rate and regular rhythm.     Heart sounds: Normal heart sounds.  Pulmonary:     Effort: Pulmonary effort is normal. Tachypnea present. No respiratory distress.     Breath sounds: Normal breath sounds.  Neurological:     Mental Status: She is alert.      UC Treatments / Results  Labs (all labs ordered are listed, but only abnormal results are displayed) Labs Reviewed - No data to display  EKG   Radiology No results found.  Procedures Procedures (including critical care time)  Medications Ordered in UC Medications - No data to display  Initial Impression / Assessment and Plan / UC Course  I have reviewed the triage vital signs and the nursing notes.  Pertinent labs & imaging results that were available during my care  of the patient were reviewed by me and considered in my medical decision making (see chart for details).    Chest pain, shortness of breath, cough.  Patient describes "heaviness" in her chest x 2 days.  She has increased shortness of breath as well as cough and congestion.  Lungs are clear and O2 sat is 97% on room air.  EKG shows sinus rhythm, rate 85, no ST elevation, compared to previous from 12/19/2022.  Sending patient to the ED for evaluation.  She declines EMS.  Her friend drove her here and will drive her to [facility] ED.  Final Clinical Impressions(s) / UC Diagnoses   Final diagnoses:  Chest pain, unspecified type  Shortness of breath  Cough, unspecified type     Discharge Instructions      Go to the emergency department for evaluation of your chest pain and shortness of breath.     ED Prescriptions   None    PDMP not reviewed this encounter.    Mickie Bail, NP 07/24/23 [phone]

## 2023-07-24 NOTE — ED Triage Notes (Signed)
Pt to ED for chest tightness since 3 weeks, has had URI and productive cough (yellow) since 3 weeks and states cannot get deep breaths in. Was recently on prednisone and has hx asthma. Blue tops sent.

## 2023-07-24 NOTE — ED Triage Notes (Addendum)
Patient to Urgent Care with complaints of cough/ chest congestion and heaviness/ shortness of breath.  Reports persistent cough x3 weeks. Seen at her pcp 2/12. Started feeling worse on Sunday- describes feeling like she's unable to take a good deep breath. [facility] w/ exertion.  Meds: hx asthma using inhalers (increased use of albuterol)/ tessalon/ delsym. Completed prednisone this morning.

## 2023-07-24 NOTE — ED Notes (Signed)
Patient is being discharged from the Urgent Care and sent to the Emergency Department via POV . Per Wendee Beavers, patient is in need of higher level of care due to chest pain. Patient is aware and verbalizes understanding of plan of care.  Vitals:   07/24/23 1432  BP: 138/77  Pulse: 95  Resp: (!) 24  Temp: 98 F (36.7 C)  SpO2: 97%

## 2023-07-24 NOTE — ED Provider Notes (Signed)
[facility] Provider Note    Event Date/Time   First MD Initiated Contact with Patient 07/24/23 2202     (approximate)   History   Chest Pain and Cough   HPI  Olivia Romero is a 68 y.o. female with history of asthma, diabetes, GERD, hypertension, hyperlipidemia, IBS, presenting with cough and chest tightness for the last 3 weeks.  States that she has been having some congestion.  Chest tightness is worse with the cough.  No shortness of breath, no new leg swelling.  No chest pain right now.  Was recently on prednisone for her asthma exacerbation.  States that initially productive sputum was white and then became yellow and now is a mix of yellow and white.  No fevers at home.  No nausea, vomiting, diarrhea, urinary symptoms.   On independent review she was seen by primary care provider on 12 February, was diagnosed with an upper respiratory infection and given azithromycin, prednisone, Tessalon Perles.   Physical Exam   Triage Vital Signs: ED Triage Vitals  Encounter Vitals Group     BP 07/24/23 1538 (!) 151/73     Systolic BP Percentile --      Diastolic BP Percentile --      Pulse Rate 07/24/23 1538 84     Resp 07/24/23 1538 20     Temp 07/24/23 1538 98.7 F (37.1 C)     Temp Source 07/24/23 1538 Oral     SpO2 07/24/23 1538 99 %     Weight 07/24/23 1540 183 lb (83 kg)     Height 07/24/23 1540 5' (1.524 m)     Head Circumference --      Peak Flow --      Pain Score 07/24/23 1536 5     Pain Loc --      Pain Education --      Exclude from Growth Chart --     Most recent vital signs: Vitals:   07/24/23 2206 07/24/23 2229  BP: (!) 171/66   Pulse: 88   Resp: 17   Temp:  98 F (36.7 C)  SpO2: 96%      General: Awake, no distress.  CV:  Good peripheral perfusion.  Resp:  Normal effort.  Clear, no tachypnea or work of breathing Abd:  No distention.  Soft nontender Other:  Moist mucous membranes, she is status post BKA, no unilateral  swelling to her thigh or calf, no lower extremity edema.   ED Results / Procedures / Treatments   Labs (all labs ordered are listed, but only abnormal results are displayed) Labs Reviewed  BASIC METABOLIC PANEL - Abnormal; Notable for the following components:      Result Value   Potassium 5.3 (*)    CO2 15 (*)    Glucose, Bld 449 (*)    BUN 25 (*)    Anion gap 19 (*)    All other components within normal limits  CBC - Abnormal; Notable for the following components:   WBC 12.2 (*)    HCT 35.2 (*)    Platelets 489 (*)    All other components within normal limits  CBG MONITORING, ED - Abnormal; Notable for the following components:   Glucose-Capillary 223 (*)    All other components within normal limits  RESP PANEL BY RT-PCR (RSV, FLU A&B, COVID)  RVPGX2  BETA-HYDROXYBUTYRIC ACID  URINALYSIS, W/ REFLEX TO CULTURE (INFECTION SUSPECTED)  BLOOD GAS, VENOUS  BASIC METABOLIC PANEL  TROPONIN I (HIGH SENSITIVITY)  TROPONIN I (HIGH SENSITIVITY)     EKG  Normal sinus rhythm, rate of 88, normal QRS, normal QTc, T wave inversion to V2, T wave flattening to 3, no ischemic ST elevation, not significant change to prior   RADIOLOGY Chest x-ray on my interpretation without focal consolidation   PROCEDURES:  Critical Care performed: No  Procedures   MEDICATIONS ORDERED IN ED: Medications  lactated ringers bolus 1,000 mL (has no administration in time range)     IMPRESSION / MDM / ASSESSMENT AND PLAN / ED COURSE  I reviewed the triage vital signs and the nursing notes.                              Differential diagnosis includes, but is not limited to, pneumonia, viral illness, COVID, influenza, RSV, ACS, considered CHF but patient does not look overly fluid overloaded.  Consider asthma exacerbation but she has no wheezing on exam.  Consider postviral cough.  Considered PE but she has no unilateral lower extremity swelling, not hypoxic, with a cough and chest tightness, fever  infectious etiology.  Labs were obtained out of triage.  No as well as a chest x-ray.  Patient's presentation is most consistent with acute presentation with potential threat to life or bodily function.  Independent review of labs and imaging are below.  Beta hydroxybutyrate is not elevated, less likely DKA.  She is still pending her fluids, repeat BMP, her respiratory viral panel.  Suspect this is all postviral cough, likely able to be discharge once labs return and are normalizing.  Signed out to overnight team pending labs.  Clinical Course as of 07/24/23 2312  Tue Jul 24, 2023  2234 DG Chest 2 View IMPRESSION: No active cardiopulmonary disease.   [TT]  2237 Independent review of labs, potassium is 5.3, no EKG changes, electrolytes not severely deranged otherwise, creatinine is normal, she does have some acidosis with an elevated anion gap with a glucose of 449.  Will add on a UA as well as beta hydroxybutyrate and VBG to assess for DKA.  Will give her a fluid bolus as well as an insulin bolus. [TT]  2305 Repeat glucose is 223, will take out the insulin, will keep the IV fluids, will recheck a BMP to make sure that her labs are normalizing. [TT]  2306 Beta-hydroxybutyric acid Not elevated. [TT]    Clinical Course User Index [TT] Jodie Echevaria, Franchot Erichsen, MD     FINAL CLINICAL IMPRESSION(S) / ED DIAGNOSES   Final diagnoses:  Cough, unspecified type  Chest pain, unspecified type  Hyperglycemia     Rx / DC Orders   ED Discharge Orders     None        Note:  This document was prepared using Dragon voice recognition software and may include unintentional dictation errors.    Claybon Jabs, MD 07/24/23 [phone]

## 2023-07-25 ENCOUNTER — Telehealth: Payer: Self-pay

## 2023-07-25 LAB — BASIC METABOLIC PANEL
Anion gap: 13 (ref 5–15)
Anion gap: 17 — ABNORMAL HIGH (ref 5–15)
BUN: 17 mg/dL (ref 8–23)
BUN: 19 mg/dL (ref 8–23)
CO2: 17 mmol/L — ABNORMAL LOW (ref 22–32)
CO2: 20 mmol/L — ABNORMAL LOW (ref 22–32)
Calcium: 8.7 mg/dL — ABNORMAL LOW (ref 8.9–10.3)
Calcium: 9 mg/dL (ref 8.9–10.3)
Chloride: 100 mmol/L (ref 98–111)
Chloride: 99 mmol/L (ref 98–111)
Creatinine, Ser: 0.54 mg/dL (ref 0.44–1.00)
Creatinine, Ser: 0.63 mg/dL (ref 0.44–1.00)
GFR, Estimated: >60 mL/min (ref >60)
GFR, Estimated: >60 mL/min (ref >60)
Glucose, Bld: 164 mg/dL — ABNORMAL HIGH (ref 70–99)
Glucose, Bld: 184 mg/dL — ABNORMAL HIGH (ref 70–99)
Potassium: 4 mmol/L (ref 3.5–5.1)
Potassium: 4.4 mmol/L (ref 3.5–5.1)
Sodium: 133 mmol/L — ABNORMAL LOW (ref 135–145)
Sodium: 133 mmol/L — ABNORMAL LOW (ref 135–145)

## 2023-07-25 LAB — TROPONIN I (HIGH SENSITIVITY): Troponin I (High Sensitivity): 13 ng/L (ref <18)

## 2023-07-25 NOTE — Discharge Instructions (Addendum)
 Drink plenty of fluids daily.  Return to the ER for worsening symptoms, persistent vomiting, difficulty breathing or other concerns.

## 2023-07-25 NOTE — ED Notes (Signed)
BMP was ran on blood sent before the fluid bolus. Gave bolus and ordered a new BMP.

## 2023-07-25 NOTE — ED Provider Notes (Signed)
-----------------------------------------   1:29 AM on 07/25/2023 -----------------------------------------   Metabolic panel normalized.  Patient feeling fine.  She is in good and stable condition for discharge home with follow-up with her PCP.  Strict return precautions given.  Patient verbalizes understanding and agrees with plan of care.   Irean Hong, MD 07/25/23 [phone]

## 2023-07-25 NOTE — Telephone Encounter (Signed)
Patient was identified as falling into the [place] Measure - Diabetes.   Patient was: Appointment scheduled for lab or office visit for A1c.    See's specialist Endocrinology

## 2023-08-01 DIAGNOSIS — E1165 Type 2 diabetes mellitus with hyperglycemia: Secondary | ICD-10-CM | POA: Diagnosis not present

## 2023-08-04 NOTE — Progress Notes (Unsigned)
 Cardiology Clinic Note   Date: 08/07/2023 ID: MALIEA GRANDMAISON, DOB August 27, 1955, MRN [id number]  Primary Cardiologist:  Debbe Odea, MD  Chief Complaint   Olivia Romero is a 68 y.o. female who presents to the clinic today for routine follow up of chronic cardiac conditions.   Patient Profile   CHRISTIANNA BELMONTE is followed by Dr. Azucena Cecil for the history outlined below.      Past medical history significant for: Lower extremity edema. Echo 06/27/2022: EF 60 to 65%.  No RWMA.  Grade I DD.  Normal RV size/function.  No significant valvular abnormalities. Hypertension. Hyperlipidemia. Lipid panel 07/12/2023: LDL 62, HDL 37, TG 192, total 137. OSA. Asthma. GERD. T2DM. Charcot foot. Left BKA.  In summary, patient had 1 visit with Dr. Elease Hashimoto in June 2016 for sinus tachycardia.  The episode was brief and it did not warrant further workup.  She was instructed to follow-up as needed.  Patient was first evaluated by Dr. Azucena Cecil on 05/05/2022 for edema and preop evaluation pending back surgery.  Patient reported a several month history of right lower extremity edema.  She was started on Lasix prior to her visit.  Echo was performed which showed normal LV/RV function as detailed above.   Patient was last seen in the office by Dr. Azucena Cecil on 06/29/2022 for follow-up after testing.  She was cleared to proceed with back surgery.  Patient presented to urgent care on 07/24/2023 with a 3-week history of URI symptoms.  She reported chest heaviness worsened with coughing.  She had initially been seen by PCP and was prescribed an inhaler upon follow-up several days later she was given a Z-Pak, prednisone, Tessalon Perles for her symptoms.  Given chest discomfort patient was instructed to report to ED for further evaluation.  Chest x-ray without active cardiopulmonary disease.  Troponin negative x 2.  EKG without acute changes.  Respiratory panel is negative.  Potassium was elevated at 5.3.   Blood glucose 449.  WBC 12.2.  She was treated with IV fluids and IV insulin.  Electrolytes normalized and she was discharged home.     History of Present Illness    Today, patient is doing well. She has been sick with a URI for several weeks. She is starting to feel better but the cough is still lingering. She reports occasional right lower extremity edema that is well controlled with as needed Lasix. She has not needed a dose since November. Patient denies shortness of breath, dyspnea on exertion, orthopnea or PND. No chest pain, pressure, or tightness. No palpitations. She does volunteer work with her church.       ROS: All other systems reviewed and are otherwise negative except as noted in History of Present Illness.  EKGs/Labs Reviewed       EKG was not ordered today.   01/15/2023: ALT 14; AST 16 07/25/2023: BUN 17; Creatinine, Ser 0.54; Potassium 4.0; Sodium 133   07/24/2023: Hemoglobin 12.2; WBC 12.2    Physical Exam    VS:  BP 132/64 (BP Location: Right Arm, Patient Position: Sitting, Cuff Size: Large)   Pulse (!) 106   Ht 5' (1.524 m)   Wt 188 lb (85.3 kg)   LMP  (LMP Unknown)   SpO2 98%   BMI 36.72 kg/m  , BMI Body mass index is 36.72 kg/m.  GEN: Well nourished, well developed, in no acute distress. Neck: No JVD or carotid bruits. Cardiac:  RRR. No murmurs. No rubs or gallops.   Respiratory:  Respirations regular and unlabored. Clear to auscultation without rales, wheezing or rhonchi. GI: Soft, nontender, nondistended. Extremities: Radials 2+ and equal bilaterally. No clubbing or cyanosis. No edema right lower extremity, compression sock and foot brace in place.  Left BKA with prosthesis in place.  Skin: Warm and dry, no rash. Neuro: Strength intact.  Assessment & Plan   Lower extremity edema Echo January 2024 showed normal LV/RV function, Grade I DD, no significant valvular abnormalities.  Patient reports occasional right lower extremity edema that is well  controlled with compression socks and as needed Lasix. Last dose of Lasix was in November. She has no edema today. Compression socks and foot brace in place.  -Continue Lasix as needed.  Hypertension BP today 150/78 on intake and 132/64 on my recheck. No report of headaches or dizziness.  -Continue hydrochlorothiazide.  Hyperlipidemia LDL February 2025 62, at goal. -Continue Zetia and fenofibrate.  Disposition: Return in 1 year or sooner as needed.          Signed, Etta Grandchild. Mohannad Olivero, DNP, NP-C

## 2023-08-06 ENCOUNTER — Ambulatory Visit: Payer: PPO | Admitting: Family Medicine

## 2023-08-06 ENCOUNTER — Other Ambulatory Visit: Payer: Self-pay | Admitting: Family Medicine

## 2023-08-06 DIAGNOSIS — I10 Essential (primary) hypertension: Secondary | ICD-10-CM

## 2023-08-07 ENCOUNTER — Ambulatory Visit: Payer: PPO | Attending: Student | Admitting: Student

## 2023-08-07 ENCOUNTER — Encounter: Payer: Self-pay | Admitting: Student

## 2023-08-07 ENCOUNTER — Telehealth: Payer: Self-pay

## 2023-08-07 VITALS — BP 132/64 | HR 106 | Ht 60.0 in | Wt 188.0 lb

## 2023-08-07 DIAGNOSIS — E782 Mixed hyperlipidemia: Secondary | ICD-10-CM

## 2023-08-07 DIAGNOSIS — R600 Localized edema: Secondary | ICD-10-CM | POA: Diagnosis not present

## 2023-08-07 DIAGNOSIS — I10 Essential (primary) hypertension: Secondary | ICD-10-CM | POA: Diagnosis not present

## 2023-08-07 NOTE — Telephone Encounter (Signed)
 Patient was identified as falling into the [place] Measure - Diabetes.   Patient was: Appointment scheduled for lab or office visit for A1c. Through specialist

## 2023-08-07 NOTE — Patient Instructions (Addendum)
 Medication Instructions:  Your Physician recommend you continue on your current medication as directed.    *If you need a refill on your cardiac medications before your next appointment, please call your pharmacy*   Lab Work: None ordered at this time    Follow-Up: At [facility], you and your health needs are our priority.  As part of our continuing mission to provide you with exceptional heart care, we have created designated Provider Care Teams.  These Care Teams include your primary Cardiologist (physician) and Advanced Practice Providers (APPs -  Physician Assistants and Nurse Practitioners) who all work together to provide you with the care you need, when you need it.  We recommend signing up for the patient portal called "MyChart".  Sign up information is provided on this After Visit Summary.  MyChart is used to connect with patients for Virtual Visits (Telemedicine).  Patients are able to view lab/test results, encounter notes, upcoming appointments, etc.  Non-urgent messages can be sent to your provider as well.   To learn more about what you can do with MyChart, go to [email or web address].    Your next appointment:   12 month(s)  Provider:   You may see Debbe Odea, MD or one of the following Advanced Practice Providers on your designated Care Team:   Nicolasa Ducking, NP Eula Listen, PA-C Cadence Fransico Michael, PA-C Charlsie Quest, NP Carlos Levering, NP

## 2023-08-14 DIAGNOSIS — Z794 Long term (current) use of insulin: Secondary | ICD-10-CM | POA: Diagnosis not present

## 2023-08-14 DIAGNOSIS — E113293 Type 2 diabetes mellitus with mild nonproliferative diabetic retinopathy without macular edema, bilateral: Secondary | ICD-10-CM | POA: Diagnosis not present

## 2023-08-14 DIAGNOSIS — E1129 Type 2 diabetes mellitus with other diabetic kidney complication: Secondary | ICD-10-CM | POA: Diagnosis not present

## 2023-08-14 DIAGNOSIS — R809 Proteinuria, unspecified: Secondary | ICD-10-CM | POA: Diagnosis not present

## 2023-08-14 DIAGNOSIS — E559 Vitamin D deficiency, unspecified: Secondary | ICD-10-CM | POA: Diagnosis not present

## 2023-08-14 DIAGNOSIS — Z89512 Acquired absence of left leg below knee: Secondary | ICD-10-CM | POA: Diagnosis not present

## 2023-08-14 DIAGNOSIS — Z789 Other specified health status: Secondary | ICD-10-CM | POA: Diagnosis not present

## 2023-08-14 DIAGNOSIS — E1142 Type 2 diabetes mellitus with diabetic polyneuropathy: Secondary | ICD-10-CM | POA: Diagnosis not present

## 2023-08-14 DIAGNOSIS — E781 Pure hyperglyceridemia: Secondary | ICD-10-CM | POA: Diagnosis not present

## 2023-08-14 DIAGNOSIS — E1165 Type 2 diabetes mellitus with hyperglycemia: Secondary | ICD-10-CM | POA: Diagnosis not present

## 2023-08-16 DIAGNOSIS — E538 Deficiency of other specified B group vitamins: Secondary | ICD-10-CM | POA: Diagnosis not present

## 2023-08-23 DIAGNOSIS — E538 Deficiency of other specified B group vitamins: Secondary | ICD-10-CM | POA: Diagnosis not present

## 2023-08-29 DIAGNOSIS — E1165 Type 2 diabetes mellitus with hyperglycemia: Secondary | ICD-10-CM | POA: Diagnosis not present

## 2023-08-30 ENCOUNTER — Ambulatory Visit: Payer: HMO | Admitting: Podiatry

## 2023-08-31 DIAGNOSIS — E538 Deficiency of other specified B group vitamins: Secondary | ICD-10-CM | POA: Diagnosis not present

## 2023-09-04 ENCOUNTER — Ambulatory Visit (INDEPENDENT_AMBULATORY_CARE_PROVIDER_SITE_OTHER): Admitting: Podiatry

## 2023-09-04 ENCOUNTER — Encounter: Payer: Self-pay | Admitting: Podiatry

## 2023-09-04 DIAGNOSIS — M79674 Pain in right toe(s): Secondary | ICD-10-CM

## 2023-09-04 DIAGNOSIS — E1149 Type 2 diabetes mellitus with other diabetic neurological complication: Secondary | ICD-10-CM | POA: Diagnosis not present

## 2023-09-04 DIAGNOSIS — B351 Tinea unguium: Secondary | ICD-10-CM | POA: Diagnosis not present

## 2023-09-04 DIAGNOSIS — L84 Corns and callosities: Secondary | ICD-10-CM

## 2023-09-05 NOTE — Progress Notes (Signed)
 Subjective: Chief Complaint  Patient presents with   Foot Pain    Rm#11 right foot follow up patient states has a place on bottom of foot needing to be looked at.    68 y.o. returns the office today for follow-up evaluation of calluses on her right foot.  States that she has been doing well she denies any skin breakdown.  She states that she received authorization for new brace.  No recent injury or changes otherwise and she has no other concerns today.  PCP: Danelle Berry, PA-C Endocrinologist: Dr. Quillian Quince seen August 14, 2023 Last A1c was 8.7 on July 12, 2023  Objective: AAO 3, NAD DP/PT pulses palpable, CRT less than 3 seconds Left below-knee amputation Sensation absent with Phoebe Perch monofilament He also mildly hypertrophic dystrophic with yellow discoloration.  She has tenderness nails 1 through 5 on the right foot.   On the plantar lateral aspect the foot is a still some mild hyperkeratotic lesion noted without any underlying ulceration, drainage or any signs of infection.  There is no open lesions identified. Cavovarus foot type. No pain with calf compression, swelling, warmth, erythema   Assessment: Charcot deformity with preulcerative calluses right foot; symptomatic onychosis  Plan:  Symptomatic onychomycosis -Sharply debrided nails x 5 without any complications or bleeding  Preulcerative lesion right foot -Sharply debrided x 1 without any complications or bleeding.  Continue moisturizer, offloading.  Charcot, right -Continue [place] brace for now.  She received authorization for new brace.  I will schedule her see Nicki Guadalajara, pedorthist for casting.    Diabetes -Glucose control, daily foot inspection.

## 2023-09-06 DIAGNOSIS — E538 Deficiency of other specified B group vitamins: Secondary | ICD-10-CM | POA: Diagnosis not present

## 2023-09-29 DIAGNOSIS — E1165 Type 2 diabetes mellitus with hyperglycemia: Secondary | ICD-10-CM | POA: Diagnosis not present

## 2023-10-09 DIAGNOSIS — E538 Deficiency of other specified B group vitamins: Secondary | ICD-10-CM | POA: Diagnosis not present

## 2023-10-23 DIAGNOSIS — Z794 Long term (current) use of insulin: Secondary | ICD-10-CM | POA: Diagnosis not present

## 2023-10-23 DIAGNOSIS — E113293 Type 2 diabetes mellitus with mild nonproliferative diabetic retinopathy without macular edema, bilateral: Secondary | ICD-10-CM | POA: Diagnosis not present

## 2023-10-25 ENCOUNTER — Ambulatory Visit

## 2023-10-25 DIAGNOSIS — M14671 Charcot's joint, right ankle and foot: Secondary | ICD-10-CM

## 2023-10-25 DIAGNOSIS — M21171 Varus deformity, not elsewhere classified, right ankle: Secondary | ICD-10-CM

## 2023-10-29 DIAGNOSIS — E1165 Type 2 diabetes mellitus with hyperglycemia: Secondary | ICD-10-CM | POA: Diagnosis not present

## 2023-10-30 DIAGNOSIS — E1142 Type 2 diabetes mellitus with diabetic polyneuropathy: Secondary | ICD-10-CM | POA: Diagnosis not present

## 2023-10-30 DIAGNOSIS — E1165 Type 2 diabetes mellitus with hyperglycemia: Secondary | ICD-10-CM | POA: Diagnosis not present

## 2023-10-30 DIAGNOSIS — E781 Pure hyperglyceridemia: Secondary | ICD-10-CM | POA: Diagnosis not present

## 2023-10-30 DIAGNOSIS — E669 Obesity, unspecified: Secondary | ICD-10-CM | POA: Diagnosis not present

## 2023-10-30 DIAGNOSIS — Z789 Other specified health status: Secondary | ICD-10-CM | POA: Diagnosis not present

## 2023-10-30 DIAGNOSIS — Z794 Long term (current) use of insulin: Secondary | ICD-10-CM | POA: Diagnosis not present

## 2023-10-30 DIAGNOSIS — Z89512 Acquired absence of left leg below knee: Secondary | ICD-10-CM | POA: Diagnosis not present

## 2023-10-30 DIAGNOSIS — E1169 Type 2 diabetes mellitus with other specified complication: Secondary | ICD-10-CM | POA: Diagnosis not present

## 2023-10-30 DIAGNOSIS — E1129 Type 2 diabetes mellitus with other diabetic kidney complication: Secondary | ICD-10-CM | POA: Diagnosis not present

## 2023-10-30 DIAGNOSIS — R809 Proteinuria, unspecified: Secondary | ICD-10-CM | POA: Diagnosis not present

## 2023-10-30 DIAGNOSIS — E113293 Type 2 diabetes mellitus with mild nonproliferative diabetic retinopathy without macular edema, bilateral: Secondary | ICD-10-CM | POA: Diagnosis not present

## 2023-11-05 DIAGNOSIS — E538 Deficiency of other specified B group vitamins: Secondary | ICD-10-CM | POA: Diagnosis not present

## 2023-11-15 ENCOUNTER — Other Ambulatory Visit: Payer: Self-pay | Admitting: Family Medicine

## 2023-11-15 DIAGNOSIS — I10 Essential (primary) hypertension: Secondary | ICD-10-CM

## 2023-11-16 NOTE — Telephone Encounter (Signed)
 Requested medication (s) are due for refill today: yes  Requested medication (s) are on the active medication list: yes  Last refill:  08/06/23 #90  Future visit scheduled: no  Notes to clinic:  abnormal lab work    Requested Prescriptions  Pending Prescriptions Disp Refills   hydrochlorothiazide  (HYDRODIURIL ) 25 MG tablet [Pharmacy Med Name: HYDROCHLOROTHIAZIDE  25MG  TABLET] 90 tablet 0    Sig: TAKE ONE TABLET (25 MG TOTAL) BY MOUTH DAILY.     Cardiovascular: Diuretics - Thiazide Failed - 11/16/2023  4:11 PM      Failed - Na in normal range and within 180 days    Sodium  Date Value Ref Range Status  07/25/2023 133 (L) 135 - 145 mmol/L Final  07/12/2023 135 (A) 137 - 147 Final         Passed - Cr in normal range and within 180 days    Creat  Date Value Ref Range Status  01/15/2023 0.67 0.50 - 1.05 mg/dL Final   Creatinine, Ser  Date Value Ref Range Status  07/25/2023 0.54 0.44 - 1.00 mg/dL Final   Creatinine, POC  Date Value Ref Range Status  05/15/2016 NEG mg/dL Final   Creatinine, Urine  Date Value Ref Range Status  07/12/2023 104.7  Final         Passed - K in normal range and within 180 days    Potassium  Date Value Ref Range Status  07/25/2023 4.0 3.5 - 5.1 mmol/L Final         Passed - Last BP in normal range    BP Readings from Last 1 Encounters:  08/07/23 132/64         Passed - Valid encounter within last 6 months    Recent Outpatient Visits           4 months ago Mild intermittent asthma with acute exacerbation   [facility] Quinton Buckler, FNP   4 months ago Mild intermittent asthma with acute exacerbation   [facility] Adeline Hone, PA-C

## 2023-11-26 NOTE — Progress Notes (Signed)
 Casted for new AFO [place]  previous shara has expired resending for newauth request  Order submitted to [place]  for brace  Lolita Schultze Cped CFo, CFm

## 2023-11-29 ENCOUNTER — Telehealth: Payer: Self-pay

## 2023-11-29 DIAGNOSIS — E1165 Type 2 diabetes mellitus with hyperglycemia: Secondary | ICD-10-CM | POA: Diagnosis not present

## 2023-11-29 NOTE — Telephone Encounter (Signed)
 Copied from CRM [id number]. Topic: Clinical - Medication Question >> Nov 29, 2023 10:04 AM Carlatta H wrote: Reason for CRM: Patient would like to know if a prescription can be written for bed pads to be used at night//Please call to advise

## 2023-11-29 NOTE — Telephone Encounter (Signed)
 Appt sch'd for July 10

## 2023-11-30 ENCOUNTER — Telehealth: Payer: Self-pay

## 2023-11-30 NOTE — Telephone Encounter (Signed)
 HTA PA received and approved 12/04/23-03/03/24  Auth #[id number]  Indexed in media

## 2023-12-03 DIAGNOSIS — M25551 Pain in right hip: Secondary | ICD-10-CM | POA: Diagnosis not present

## 2023-12-03 DIAGNOSIS — M4316 Spondylolisthesis, lumbar region: Secondary | ICD-10-CM | POA: Diagnosis not present

## 2023-12-03 DIAGNOSIS — Z6835 Body mass index (BMI) 35.0-35.9, adult: Secondary | ICD-10-CM | POA: Diagnosis not present

## 2023-12-04 ENCOUNTER — Other Ambulatory Visit: Payer: Self-pay | Admitting: Neurosurgery

## 2023-12-04 ENCOUNTER — Ambulatory Visit: Admitting: Podiatry

## 2023-12-04 DIAGNOSIS — E1149 Type 2 diabetes mellitus with other diabetic neurological complication: Secondary | ICD-10-CM | POA: Diagnosis not present

## 2023-12-04 DIAGNOSIS — L84 Corns and callosities: Secondary | ICD-10-CM

## 2023-12-04 DIAGNOSIS — M4316 Spondylolisthesis, lumbar region: Secondary | ICD-10-CM

## 2023-12-04 DIAGNOSIS — B351 Tinea unguium: Secondary | ICD-10-CM

## 2023-12-04 DIAGNOSIS — M79674 Pain in right toe(s): Secondary | ICD-10-CM | POA: Diagnosis not present

## 2023-12-04 NOTE — Progress Notes (Signed)
 Subjective: Chief Complaint  Patient presents with   [facility]    RM#13 [name] patient has no other concerns today.     68 y.o. returns the office today for follow-up evaluation of calluses on her right foot as well as for elongated nails that she has difficulty trimming herself.  She does not see any open lesions.  She still been waiting her brace.   She also has been experiencing nerve pain coming from her hip down to the foot.  She saw neurosurgery for this and a referral to orthopedics has been placed.   PCP: [name], PA-C Endocrinologist: Dr. Therisa Byars seen 10/30/2023 Last A1c was 8.7 on 10/23/2023  Objective: AAO 3, NAD DP/PT pulses palpable, CRT less than 3 seconds Left below-knee amputation Sensation absent with Semmes Weinstein monofilament He also mildly hypertrophic dystrophic with yellow discoloration.  She has tenderness nails 1 through 5 on the right foot.   On the plantar lateral aspect the foot is a still some mild hyperkeratotic lesion noted without any underlying ulceration, drainage or any signs of infection.  There is no open lesions identified. Cavovarus foot type. No pain with calf compression, swelling, warmth, erythema   Assessment: Charcot deformity with preulcerative calluses right foot; symptomatic onychosis  Plan:  Symptomatic onychomycosis -Sharply debrided nails x 5 without any complications or bleeding  Preulcerative lesion right foot -Sharply debrided x 1 without any complications or bleeding.  Continue moisturizer, offloading.  Charcot, right -Continue [place]  brace for now.  Awaiting new brace.   Diabetes -Glucose control, daily foot inspection.  Olivia Romero DPM

## 2023-12-06 ENCOUNTER — Telehealth: Payer: Self-pay

## 2023-12-06 ENCOUNTER — Ambulatory Visit
Admission: RE | Admit: 2023-12-06 | Discharge: 2023-12-06 | Disposition: A | Discharge status: Home / Self Care | Source: Ambulatory Visit | Attending: Neurosurgery | Admitting: Neurosurgery

## 2023-12-06 DIAGNOSIS — M5135 Other intervertebral disc degeneration, thoracolumbar region: Secondary | ICD-10-CM | POA: Diagnosis not present

## 2023-12-06 DIAGNOSIS — M48061 Spinal stenosis, lumbar region without neurogenic claudication: Secondary | ICD-10-CM | POA: Diagnosis not present

## 2023-12-06 DIAGNOSIS — M4805 Spinal stenosis, thoracolumbar region: Secondary | ICD-10-CM | POA: Diagnosis not present

## 2023-12-06 DIAGNOSIS — M5126 Other intervertebral disc displacement, lumbar region: Secondary | ICD-10-CM | POA: Diagnosis not present

## 2023-12-06 DIAGNOSIS — M4316 Spondylolisthesis, lumbar region: Secondary | ICD-10-CM | POA: Diagnosis not present

## 2023-12-06 MED ORDER — GADOBUTROL 1 MMOL/ML IV SOLN
7.5000 mL | Freq: Once | INTRAVENOUS | Status: AC | PRN
  Administered 2023-12-06: 7.5 mL via INTRAVENOUS

## 2023-12-06 NOTE — Telephone Encounter (Signed)
 Lm for patient to please call and schedule appt for AFO / brace delivery 30 min appt

## 2023-12-12 ENCOUNTER — Ambulatory Visit: Admitting: Orthopaedic Surgery

## 2023-12-12 ENCOUNTER — Other Ambulatory Visit (INDEPENDENT_AMBULATORY_CARE_PROVIDER_SITE_OTHER): Payer: Self-pay

## 2023-12-12 VITALS — Ht 59.0 in | Wt 187.4 lb

## 2023-12-12 DIAGNOSIS — G8929 Other chronic pain: Secondary | ICD-10-CM | POA: Diagnosis not present

## 2023-12-12 DIAGNOSIS — M5441 Lumbago with sciatica, right side: Secondary | ICD-10-CM

## 2023-12-12 NOTE — Progress Notes (Signed)
 The patient is someone I am seeing for the first time.  She actually has a history of a complex lumbar spine surgery with a fusion at L4-L5.  This was a PLIF and was done by Dr. Gillie.  That is actually done well.  She has had a month worth of increasing right-sided low back pain that radiates down her sciatic region down her leg.  She denies any groin pain.  There are x-rays of the pelvis and right hip on the canopy system.  She has had a recent MRI of her lumbar spine as well that she still has to get back with Dr. Gillie to go over.  Her right hip moves smoothly and fluidly with no blocks to rotation and no pain in the groin at all.  X-rays in the canopy system show only mild arthritic changes in the right hip with a small osteophyte off of the lateral edge of the femoral head and neck but this is minimal.  X-rays of the lumbar spine show intact hardware from previous L4-L5 fusion.  I gave her reassurance that this is not a hip issue from an orthopedic standpoint.  She is planning to follow-up with Dr. Cabbell for her spine.

## 2023-12-13 ENCOUNTER — Ambulatory Visit: Admitting: Family Medicine

## 2023-12-13 DIAGNOSIS — E538 Deficiency of other specified B group vitamins: Secondary | ICD-10-CM | POA: Diagnosis not present

## 2023-12-19 ENCOUNTER — Other Ambulatory Visit: Payer: Self-pay | Admitting: Family Medicine

## 2023-12-19 DIAGNOSIS — E785 Hyperlipidemia, unspecified: Secondary | ICD-10-CM

## 2023-12-19 DIAGNOSIS — I10 Essential (primary) hypertension: Secondary | ICD-10-CM

## 2023-12-19 DIAGNOSIS — E781 Pure hyperglyceridemia: Secondary | ICD-10-CM

## 2023-12-20 DIAGNOSIS — M4316 Spondylolisthesis, lumbar region: Secondary | ICD-10-CM | POA: Diagnosis not present

## 2023-12-20 DIAGNOSIS — M51369 Other intervertebral disc degeneration, lumbar region without mention of lumbar back pain or lower extremity pain: Secondary | ICD-10-CM | POA: Diagnosis not present

## 2023-12-20 DIAGNOSIS — Z6836 Body mass index (BMI) 36.0-36.9, adult: Secondary | ICD-10-CM | POA: Diagnosis not present

## 2023-12-20 DIAGNOSIS — M48062 Spinal stenosis, lumbar region with neurogenic claudication: Secondary | ICD-10-CM | POA: Diagnosis not present

## 2023-12-20 NOTE — Telephone Encounter (Signed)
 Requested Prescriptions  Pending Prescriptions Disp Refills   hydrochlorothiazide  (HYDRODIURIL ) 25 MG tablet [Pharmacy Med Name: HYDROCHLOROTHIAZIDE  25MG  TABLET] 90 tablet 0    Sig: TAKE ONE TABLET (25 MG TOTAL) BY MOUTH DAILY.     Cardiovascular: Diuretics - Thiazide Failed - 12/20/2023  4:05 PM      Failed - Na in normal range and within 180 days    Sodium  Date Value Ref Range Status  07/25/2023 133 (L) 135 - 145 mmol/L Final  07/12/2023 135 (A) 137 - 147 Final         Passed - Cr in normal range and within 180 days    Creat  Date Value Ref Range Status  01/15/2023 0.67 0.50 - 1.05 mg/dL Final   Creatinine, Ser  Date Value Ref Range Status  07/25/2023 0.54 0.44 - 1.00 mg/dL Final   Creatinine, POC  Date Value Ref Range Status  05/15/2016 NEG mg/dL Final   Creatinine, Urine  Date Value Ref Range Status  07/12/2023 104.7  Final         Passed - K in normal range and within 180 days    Potassium  Date Value Ref Range Status  07/25/2023 4.0 3.5 - 5.1 mmol/L Final         Passed - Last BP in normal range    BP Readings from Last 1 Encounters:  08/07/23 132/64         Passed - Valid encounter within last 6 months    Recent Outpatient Visits           5 months ago Mild intermittent asthma with acute exacerbation   [facility] Gareth Mliss FALCON, FNP   5 months ago Mild intermittent asthma with acute exacerbation   [facility] [name], PA-C               fenofibrate  (TRICOR ) 145 MG tablet [Pharmacy Med Name: FENOFIBRATE  145MG  TABLET] 90 tablet 0    Sig: TAKE ONE TABLET (145 MG TOTAL) BY MOUTH DAILY.     Cardiovascular:  Antilipid - Fibric Acid Derivatives Failed - 12/20/2023  4:05 PM      Failed - HCT in normal range and within 360 days    HCT  Date Value Ref Range Status  07/24/2023 35.2 (L) 36.0 - 46.0 % Final   Hematocrit  Date Value Ref Range Status  12/04/2018 34.2 34.0 - 46.6 % Final          Failed - PLT in normal range and within 360 days    Platelets  Date Value Ref Range Status  07/24/2023 489 (H) 150 - 400 K/uL Final  12/04/2018 498 (H) 150 - 450 x10E3/uL Final         Failed - WBC in normal range and within 360 days    WBC  Date Value Ref Range Status  07/24/2023 12.2 (H) 4.0 - 10.5 K/uL Final         Failed - Lipid Panel in normal range within the last 12 months    Cholesterol, Total  Date Value Ref Range Status  02/22/2015 292 (H) 100 - 199 mg/dL Final   Cholesterol  Date Value Ref Range Status  07/12/2023 137 0 - 200 Final   LDL Cholesterol (Calc)  Date Value Ref Range Status  01/15/2023 97 mg/dL (calc) Final    Comment:    Reference range: <100 . Desirable range <100 mg/dL for primary prevention;   <70 mg/dL  for patients with CHD or diabetic patients  with > or = 2 CHD risk factors. SABRA LDL-C is now calculated using the Martin-Hopkins  calculation, which is a validated novel method providing  better accuracy than the Friedewald equation in the  estimation of LDL-C.  Gladis APPLETHWAITE et al. SANDREA. 7986;689(80): 2061-2068  (http://education.[facility].com/faq/FAQ164)    LDL Cholesterol  Date Value Ref Range Status  07/12/2023 62  Final   HDL  Date Value Ref Range Status  07/12/2023 37 35 - 70 Final  02/22/2015 24 (L) >39 mg/dL Final    Comment:    According to ATP-III Guidelines, HDL-C >59 mg/dL is considered a negative risk factor for CHD.    Triglycerides  Date Value Ref Range Status  07/12/2023 192 (A) 40 - 160 Final         Passed - ALT in normal range and within 360 days    ALT  Date Value Ref Range Status  01/15/2023 14 6 - 29 U/L Final         Passed - AST in normal range and within 360 days    AST  Date Value Ref Range Status  01/15/2023 16 10 - 35 U/L Final         Passed - Cr in normal range and within 360 days    Creat  Date Value Ref Range Status  01/15/2023 0.67 0.50 - 1.05 mg/dL Final   Creatinine, Ser  Date  Value Ref Range Status  07/25/2023 0.54 0.44 - 1.00 mg/dL Final   Creatinine, POC  Date Value Ref Range Status  05/15/2016 NEG mg/dL Final   Creatinine, Urine  Date Value Ref Range Status  07/12/2023 104.7  Final         Passed - HGB in normal range and within 360 days    Hemoglobin  Date Value Ref Range Status  07/24/2023 12.2 12.0 - 15.0 g/dL Final  92/98/7979 88.7 11.1 - 15.9 g/dL Final         Passed - eGFR is 30 or above and within 360 days    GFR, Est African American  Date Value Ref Range Status  07/12/2020 116 > OR = 60 mL/min/1.65m2 Final   GFR, Est Non African American  Date Value Ref Range Status  07/12/2020 100 > OR = 60 mL/min/1.58m2 Final   GFR, Estimated  Date Value Ref Range Status  07/25/2023 >60 >60 mL/min Final    Comment:    (NOTE) Calculated using the CKD-EPI Creatinine Equation (2021)    eGFR  Date Value Ref Range Status  07/12/2023 82  Final         Passed - Valid encounter within last 12 months    Recent Outpatient Visits           5 months ago Mild intermittent asthma with acute exacerbation   [facility] Gareth Mliss FALCON, FNP   5 months ago Mild intermittent asthma with acute exacerbation   [facility] [name], PA-C               ezetimibe  (ZETIA ) 10 MG tablet [Pharmacy Med Name: EZETIMIBE  10MG  TABLET] 90 tablet 0    Sig: TAKE ONE TABLET (10 MG TOTAL) BY MOUTH DAILY.     Cardiovascular:  Antilipid - Sterol Transport Inhibitors Failed - 12/20/2023  4:05 PM      Failed - Lipid Panel in normal range within the last 12 months    Cholesterol, Total  Date  Value Ref Range Status  02/22/2015 292 (H) 100 - 199 mg/dL Final   Cholesterol  Date Value Ref Range Status  07/12/2023 137 0 - 200 Final   LDL Cholesterol (Calc)  Date Value Ref Range Status  01/15/2023 97 mg/dL (calc) Final    Comment:    Reference range: <100 . Desirable range <100 mg/dL for primary prevention;    <70 mg/dL for patients with CHD or diabetic patients  with > or = 2 CHD risk factors. SABRA LDL-C is now calculated using the Martin-Hopkins  calculation, which is a validated novel method providing  better accuracy than the Friedewald equation in the  estimation of LDL-C.  Gladis APPLETHWAITE et al. SANDREA. 7986;689(80): 2061-2068  (http://education.[facility].com/faq/FAQ164)    LDL Cholesterol  Date Value Ref Range Status  07/12/2023 62  Final   HDL  Date Value Ref Range Status  07/12/2023 37 35 - 70 Final  02/22/2015 24 (L) >39 mg/dL Final    Comment:    According to ATP-III Guidelines, HDL-C >59 mg/dL is considered a negative risk factor for CHD.    Triglycerides  Date Value Ref Range Status  07/12/2023 192 (A) 40 - 160 Final         Passed - AST in normal range and within 360 days    AST  Date Value Ref Range Status  01/15/2023 16 10 - 35 U/L Final         Passed - ALT in normal range and within 360 days    ALT  Date Value Ref Range Status  01/15/2023 14 6 - 29 U/L Final         Passed - Patient is not pregnant      Passed - Valid encounter within last 12 months    Recent Outpatient Visits           5 months ago Mild intermittent asthma with acute exacerbation   [facility] Gareth Clarity F, FNP   5 months ago Mild intermittent asthma with acute exacerbation   [facility] [name], PA-C

## 2023-12-29 DIAGNOSIS — E1165 Type 2 diabetes mellitus with hyperglycemia: Secondary | ICD-10-CM | POA: Diagnosis not present

## 2024-01-02 ENCOUNTER — Ambulatory Visit: Admitting: Orthopaedic Surgery

## 2024-01-03 ENCOUNTER — Ambulatory Visit (INDEPENDENT_AMBULATORY_CARE_PROVIDER_SITE_OTHER)

## 2024-01-03 DIAGNOSIS — M2141 Flat foot [pes planus] (acquired), right foot: Secondary | ICD-10-CM

## 2024-01-03 DIAGNOSIS — M21171 Varus deformity, not elsewhere classified, right ankle: Secondary | ICD-10-CM

## 2024-01-03 DIAGNOSIS — L84 Corns and callosities: Secondary | ICD-10-CM

## 2024-01-03 DIAGNOSIS — E1149 Type 2 diabetes mellitus with other diabetic neurological complication: Secondary | ICD-10-CM

## 2024-01-03 NOTE — Progress Notes (Signed)
 Orthosis was dispensed and fit was good patient was very happy with function. Brace adds support and stability to ankle and will also help to slow progression of ankle deformity.  Patient reviewed with me instructions for break-in and wear. Written instructions given to patient.  Patient will follow up as needed.  Patient presents to the office today for diabetic shoe and insole measuring.  Patient was measured with brannock device to determine size and width for 1 pair of extra depth shoes and foam casted for 3 pair of insoles.   Documentation of medical necessity will be sent to patient's treating diabetic doctor to verify and sign.   Patient's diabetic provider: Almarie fischer MD   Shoes and insoles will be ordered at that time and patient will be notified for an appointment for fitting when they arrive.  Brannock measurement: 9XWD Shoe choice:   [id number] NB Velcro / [id number] Shoe size ordered: [id number]

## 2024-01-14 DIAGNOSIS — E538 Deficiency of other specified B group vitamins: Secondary | ICD-10-CM | POA: Diagnosis not present

## 2024-01-15 DIAGNOSIS — Z6835 Body mass index (BMI) 35.0-35.9, adult: Secondary | ICD-10-CM | POA: Diagnosis not present

## 2024-01-15 DIAGNOSIS — M48062 Spinal stenosis, lumbar region with neurogenic claudication: Secondary | ICD-10-CM | POA: Diagnosis not present

## 2024-01-15 DIAGNOSIS — M51369 Other intervertebral disc degeneration, lumbar region without mention of lumbar back pain or lower extremity pain: Secondary | ICD-10-CM | POA: Diagnosis not present

## 2024-01-22 ENCOUNTER — Other Ambulatory Visit: Payer: Self-pay | Admitting: Neurosurgery

## 2024-01-25 NOTE — Progress Notes (Signed)
 Surgical Instructions   Your procedure is scheduled on Wednesday January 30, 2024. Report to [facility] Main Entrance A at 8:30 A.M., then check in with the Admitting office. Any questions or running late day of surgery: call [phone]  Questions prior to your surgery date: call [phone], Monday-Friday, 8am-4pm. If you experience any cold or flu symptoms such as cough, fever, chills, shortness of breath, etc. between now and your scheduled surgery, please notify us  at the above number.     Remember:  Do not eat or drink after midnight the night before your surgery  Take these medicines the morning of surgery with A SIP OF WATER  ezetimibe  (ZETIA )  fenofibrate  (TRICOR )  gabapentin  (NEURONTIN )   May take these medicines IF NEEDED: acetaminophen  (TYLENOL )  albuterol  (VENTOLIN  HFA) 108 (90 Base) MCG/ACT inhaler Please bring with you to the hospital colchicine   pantoprazole  (PROTONIX )   Follow your surgeon's instructions on when to stop Asprin.  If no instructions were given by your surgeon then you will need to call the office to get those instructions.    One week prior to surgery, STOP taking any Aleve, Naproxen, Ibuprofen , Motrin , Advil , Goody's, BC's, all herbal medications, fish oil, and non-prescription vitamins.   WHAT DO I DO ABOUT MY DIABETES MEDICATION?   Do not take oral diabetes medicines (pills) the morning of surgery.        DO NOT TAKE YOUR metFORMIN  (GLUCOPHAGE -XR) THE MORNING OF SURGERY.          DO NOT USE YOUR OZEMPIC  7 DAYS PRIOR TO SURGERY WITH THE LAST DOSE BEING NO LATER THAN 01/21/2024.    THE NIGHT BEFORE AND THE MORNING OF SURGERY TAKE ZERO UNITS OF YOUR insulin  aspart (NOVOLOG ).  If your CBG is greater than 220 mg/dL, you may take  of your sliding scale (correction) dose of insulin .    THE NIGHT BEFORE SURGERY TAKE 12 UNITS OF YOUR insulin  degludec (TRESIBA ) WHICH IS 50% OF YOUR REGULAR DOSE.   The day of surgery, do not take other diabetes  injectables, including Byetta  (exenatide ), Bydureon  (exenatide  ER), Victoza (liraglutide), or Trulicity (dulaglutide).   HOW TO MANAGE YOUR DIABETES BEFORE AND AFTER SURGERY  Why is it important to control my blood sugar before and after surgery? Improving blood sugar levels before and after surgery helps healing and can limit problems. A way of improving blood sugar control is eating a healthy diet by:  Eating less sugar and carbohydrates  Increasing activity/exercise  Talking with your doctor about reaching your blood sugar goals High blood sugars (greater than 180 mg/dL) can raise your risk of infections and slow your recovery, so you will need to focus on controlling your diabetes during the weeks before surgery. Make sure that the doctor who takes care of your diabetes knows about your planned surgery including the date and location.  How do I manage my blood sugar before surgery? Check your blood sugar at least 4 times a day, starting 2 days before surgery, to make sure that the level is not too high or low.  Check your blood sugar the morning of your surgery when you wake up and every 2 hours until you get to the Short Stay unit.  If your blood sugar is less than 70 mg/dL, you will need to treat for low blood sugar: Do not take insulin . Treat a low blood sugar (less than 70 mg/dL) with  cup of clear juice (cranberry or apple), 4 glucose tablets, OR glucose gel. Recheck blood  sugar in 15 minutes after treatment (to make sure it is greater than 70 mg/dL). If your blood sugar is not greater than 70 mg/dL on recheck, call [phone] for further instructions. Report your blood sugar to the short stay nurse when you get to Short Stay.  If you are admitted to the hospital after surgery: Your blood sugar will be checked by the staff and you will probably be given insulin  after surgery (instead of oral diabetes medicines) to make sure you have good blood sugar levels. The goal for blood  sugar control after surgery is 80-180 mg/dL.                      Do NOT Smoke (Tobacco/Vaping) for 24 hours prior to your procedure.  If you use a CPAP at night, you may bring your mask/headgear for your overnight stay.   You will be asked to remove any contacts, glasses, piercing's, hearing aid's, dentures/partials prior to surgery. Please bring cases for these items if needed.    Patients discharged the day of surgery will not be allowed to drive home, and someone needs to stay with them for 24 hours.  SURGICAL WAITING ROOM VISITATION Patients may have no more than 2 support people in the waiting area - these visitors may rotate.   Pre-op nurse will coordinate an appropriate time for 1 ADULT support person, who may not rotate, to accompany patient in pre-op.  Children under the age of 70 must have an adult with them who is not the patient and must remain in the main waiting area with an adult.  If the patient needs to stay at the hospital during part of their recovery, the visitor guidelines for inpatient rooms apply.  Please refer to the [facility] website for the visitor guidelines for any additional information.   If you received a COVID test during your pre-op visit  it is requested that you wear a mask when out in public, stay away from anyone that may not be feeling well and notify your surgeon if you develop symptoms. If you have been in contact with anyone that has tested positive in the last 10 days please notify you surgeon.      Pre-operative 5 CHG Bathing Instructions   You can play a key role in reducing the risk of infection after surgery. Your skin needs to be as free of germs as possible. You can reduce the number of germs on your skin by washing with CHG (chlorhexidine  gluconate) soap before surgery. CHG is an antiseptic soap that kills germs and continues to kill germs even after washing.   DO NOT use if you have an allergy to chlorhexidine /CHG or antibacterial  soaps. If your skin becomes reddened or irritated, stop using the CHG and notify one of our RNs at [phone].   Please shower with the CHG soap starting 4 days before surgery using the following schedule:     Please keep in mind the following:  DO NOT shave, including legs and underarms, starting the day of your first shower.   Place clean sheets on your bed the day you start using CHG soap. Use a clean washcloth (not used since being washed) for each shower. DO NOT sleep with pets once you start using the CHG.   CHG Shower Instructions:  Wash your face and private area with normal soap. If you choose to wash your hair, wash first with your normal shampoo.  After you use shampoo/soap, rinse  your hair and body thoroughly to remove shampoo/soap residue.  Turn the water OFF and apply about 3 tablespoons (45 ml) of CHG soap to a CLEAN washcloth.  Apply CHG soap ONLY FROM YOUR NECK DOWN TO YOUR TOES (washing for 3-5 minutes)  DO NOT use CHG soap on face, private areas, open wounds, or sores.  Pay special attention to the area where your surgery is being performed.  If you are having back surgery, having someone wash your back for you may be helpful. Wait 2 minutes after CHG soap is applied, then you may rinse off the CHG soap.  Pat dry with a clean towel  Put on clean clothes/pajamas   If you choose to wear lotion, please use ONLY the CHG-compatible lotions that are listed below.  Additional instructions for the day of surgery: DO NOT APPLY any lotions, deodorants or perfumes.   Do not bring valuables to the hospital. [facility] is not responsible for any belongings/valuables. Do not wear nail polish, gel polish, artificial nails, or any other type of covering on natural nails (fingers and toes) Do not wear jewelry or makeup Put on clean/comfortable clothes.  Please brush your teeth.  Ask your nurse before applying any prescription medications to the skin.     CHG Compatible  Lotions   Aveeno Moisturizing lotion  Cetaphil Moisturizing Cream  Cetaphil Moisturizing Lotion  Clairol Herbal Essence Moisturizing Lotion, Dry Skin  Clairol Herbal Essence Moisturizing Lotion, Extra Dry Skin  Clairol Herbal Essence Moisturizing Lotion, Normal Skin  Curel Age Defying Therapeutic Moisturizing Lotion with Alpha Hydroxy  Curel Extreme Care Body Lotion  Curel Soothing Hands Moisturizing Hand Lotion  Curel Therapeutic Moisturizing Cream, Fragrance-Free  Curel Therapeutic Moisturizing Lotion, Fragrance-Free  Curel Therapeutic Moisturizing Lotion, Original Formula  Eucerin Daily Replenishing Lotion  Eucerin Dry Skin Therapy Plus Alpha Hydroxy Crme  Eucerin Dry Skin Therapy Plus Alpha Hydroxy Lotion  Eucerin Original Crme  Eucerin Original Lotion  Eucerin Plus Crme Eucerin Plus Lotion  Eucerin TriLipid Replenishing Lotion  Keri Anti-Bacterial Hand Lotion  Keri Deep Conditioning Original Lotion Dry Skin Formula Softly Scented  Keri Deep Conditioning Original Lotion, Fragrance Free Sensitive Skin Formula  Keri Lotion Fast Absorbing Fragrance Free Sensitive Skin Formula  Keri Lotion Fast Absorbing Softly Scented Dry Skin Formula  Keri Original Lotion  Keri Skin Renewal Lotion Keri Silky Smooth Lotion  Keri Silky Smooth Sensitive Skin Lotion  Nivea Body Creamy Conditioning Oil  Nivea Body Extra Enriched Lotion  Nivea Body Original Lotion  Nivea Body Sheer Moisturizing Lotion Nivea Crme  Nivea Skin Firming Lotion  NutraDerm 30 Skin Lotion  NutraDerm Skin Lotion  NutraDerm Therapeutic Skin Cream  NutraDerm Therapeutic Skin Lotion  ProShield Protective Hand Cream  Provon moisturizing lotion  Please read over the following fact sheets that you were given.

## 2024-01-28 ENCOUNTER — Encounter (HOSPITAL_COMMUNITY): Payer: Self-pay

## 2024-01-28 ENCOUNTER — Encounter (HOSPITAL_COMMUNITY)
Admission: RE | Admit: 2024-01-28 | Discharge: 2024-01-28 | Disposition: A | Discharge status: Home / Self Care | Source: Ambulatory Visit | Attending: Neurosurgery | Admitting: Neurosurgery

## 2024-01-28 ENCOUNTER — Other Ambulatory Visit: Payer: Self-pay

## 2024-01-28 VITALS — BP 157/68 | HR 74 | Temp 98.9°F | Resp 17 | Ht 59.0 in | Wt 182.0 lb

## 2024-01-28 DIAGNOSIS — Z794 Long term (current) use of insulin: Secondary | ICD-10-CM | POA: Insufficient documentation

## 2024-01-28 DIAGNOSIS — Z01818 Encounter for other preprocedural examination: Secondary | ICD-10-CM

## 2024-01-28 DIAGNOSIS — E11610 Type 2 diabetes mellitus with diabetic neuropathic arthropathy: Secondary | ICD-10-CM | POA: Diagnosis not present

## 2024-01-28 DIAGNOSIS — I10 Essential (primary) hypertension: Secondary | ICD-10-CM | POA: Diagnosis not present

## 2024-01-28 DIAGNOSIS — Z89512 Acquired absence of left leg below knee: Secondary | ICD-10-CM | POA: Insufficient documentation

## 2024-01-28 DIAGNOSIS — G4733 Obstructive sleep apnea (adult) (pediatric): Secondary | ICD-10-CM | POA: Insufficient documentation

## 2024-01-28 DIAGNOSIS — Z01812 Encounter for preprocedural laboratory examination: Secondary | ICD-10-CM | POA: Diagnosis not present

## 2024-01-28 DIAGNOSIS — Z8673 Personal history of transient ischemic attack (TIA), and cerebral infarction without residual deficits: Secondary | ICD-10-CM | POA: Insufficient documentation

## 2024-01-28 DIAGNOSIS — K219 Gastro-esophageal reflux disease without esophagitis: Secondary | ICD-10-CM | POA: Diagnosis not present

## 2024-01-28 LAB — SURGICAL PCR SCREEN
MRSA, PCR: NEGATIVE
Staphylococcus aureus: NEGATIVE

## 2024-01-28 LAB — CBC
HCT: 38.1 % (ref 36.0–46.0)
Hemoglobin: 12.5 g/dL (ref 12.0–15.0)
MCH: 27 pg (ref 26.0–34.0)
MCHC: 32.8 g/dL (ref 30.0–36.0)
MCV: 82.3 fL (ref 80.0–100.0)
Platelets: 391 K/uL (ref 150–400)
RBC: 4.63 MIL/uL (ref 3.87–5.11)
RDW: 14 % (ref 11.5–15.5)
WBC: 9.8 K/uL (ref 4.0–10.5)
nRBC: 0 % (ref 0.0–0.2)

## 2024-01-28 LAB — HEMOGLOBIN A1C
Hgb A1c MFr Bld: 8.3 % — ABNORMAL HIGH (ref 4.8–5.6)
Mean Plasma Glucose: 191.51 mg/dL

## 2024-01-28 LAB — GLUCOSE, CAPILLARY: Glucose-Capillary: 185 mg/dL — ABNORMAL HIGH (ref 70–99)

## 2024-01-28 LAB — TYPE AND SCREEN
ABO/RH(D): O POS
Antibody Screen: NEGATIVE

## 2024-01-28 NOTE — Progress Notes (Signed)
 PCP -Michelene Cower, PA-C  Cardiologist - Redell Darliss COME  PPM/ICD - deniies Device Orders -  Rep Notified -   Chest x-ray - na EKG - 07/25/23 Stress Test - 02/15/07 ECHO - 06/27/22 Cardiac Cath - denies  Sleep Study - 6-7 years ago CPAP - does not use every night  Fasting Blood Sugar - 140 Checks Blood Sugar - uses a Freestyle Libre  Last dose of GLP1 agonist-  01/11/24 GLP1 instructions: hold Ozempic  7 days prior to surgery  Blood Thinner Instructions:na  Aspirin  Instructions:follow your surgeon's instructions for holding aspirin . Pt states her last dose was 01/14/24. Office visit with Dr. Gillie was 01/15/24 and he told her to stop taking it then.   ERAS Protcol -no PRE-SURGERY Ensure or G2-   COVID TEST- na   Anesthesia review: yes- HgbA1C-8.3, LOV with cardiology 08/07/23; no clearance requested by surgeon.Pt to follow up with cardiology in one year.   Patient denies shortness of breath, fever, cough and chest pain at PAT appointment   All instructions explained to the patient, with a verbal understanding of the material. Patient agrees to go over the instructions while at home for a better understanding. The opportunity to ask questions was provided.

## 2024-01-29 DIAGNOSIS — E1165 Type 2 diabetes mellitus with hyperglycemia: Secondary | ICD-10-CM | POA: Diagnosis not present

## 2024-01-29 NOTE — Anesthesia Preprocedure Evaluation (Addendum)
 Anesthesia Evaluation  Patient identified by MRN, date of birth, ID band Patient awake    Reviewed: Allergy & Precautions, H&P , NPO status , Patient's Chart, lab work & pertinent test results  History of Anesthesia Complications (+) PONV and history of anesthetic complications  Airway Mallampati: III  TM Distance: >3 FB Neck ROM: Full    Dental no notable dental hx. (+) Dental Advisory Given, Teeth Intact   Pulmonary asthma , sleep apnea and Continuous Positive Airway Pressure Ventilation , pneumonia, resolved   Pulmonary exam normal breath sounds clear to auscultation       Cardiovascular Exercise Tolerance: Poor hypertension, On Medications + Peripheral Vascular Disease   Rhythm:Regular Rate:Normal     Neuro/Psych  Neuromuscular disease CVA  negative psych ROS   GI/Hepatic Neg liver ROS,GERD  Medicated,,  Endo/Other  diabetes, Poorly Controlled, Type 2    Renal/GU negative Renal ROS     Musculoskeletal  (+) Arthritis ,    Abdominal  (+) + obese  Peds  Hematology  (+) Blood dyscrasia, anemia   Anesthesia Other Findings   Reproductive/Obstetrics                              Anesthesia Physical Anesthesia Plan  ASA: 3  Anesthesia Plan: General   Post-op Pain Management: Tylenol  PO (pre-op)*   Induction: Intravenous  PONV Risk Score and Plan: 4 or greater and Ondansetron , Midazolam  and Treatment may vary due to age or medical condition  Airway Management Planned: Oral ETT  Additional Equipment:   Intra-op Plan:   Post-operative Plan: Extubation in OR  Informed Consent: I have reviewed the patients History and Physical, chart, labs and discussed the procedure including the risks, benefits and alternatives for the proposed anesthesia with the patient or authorized representative who has indicated his/her understanding and acceptance.     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    Follows with endocrinology due to her history of uncontrolled insulin -dependent DM2.  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  Last seen in follow-up on 10/30/2023, diabetes remained uncontrolled, she was initiated on Ozempic  and continued on Tresiba , NovoLog , and metformin .  Follows with cardiology for history of lower extremity edema, HTN, HLD.  Echo January 2024 showed normal biventricular function, grade 1 DD, no significant valve abnormalities.  Last seen in follow-up 08/07/2023, stable at that time, no changes to management, 1 year follow-up recommended.  Patient reports last dose of Ozempic  01/11/2024.  Preop CBC reviewed, WNL.  A1c 8.3.  BMP hemolyzed and will need to be redrawn day of surgery.  EKG 07/24/2023: Normal sinus rhythm.  Rate 88. Incomplete right bundle branch block  TTE 06/27/2022: 1. Left ventricular ejection fraction, by estimation, is 60 to 65%. The  left ventricle has normal function. The left ventricle has no regional  wall motion abnormalities. Left ventricular diastolic parameters are  consistent with Grade I diastolic  dysfunction (impaired relaxation).  2. Right ventricular systolic function is normal. The right ventricular  size is normal.  Tricuspid regurgitation signal is inadequate for assessing  PA pressure.  3. The mitral valve is normal in structure. No evidence of mitral valve  regurgitation. No evidence  of mitral stenosis.  4. The aortic valve is normal in structure. Aortic valve regurgitation is  not visualized. No aortic stenosis is present.  5. The inferior vena cava is normal in size with greater than 50%  respiratory variability, suggesting right atrial pressure of 3 mmHg.   )         Anesthesia Quick Evaluation

## 2024-01-29 NOTE — Progress Notes (Signed)
 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     Follows with endocrinology due to her history of uncontrolled insulin -dependent DM2.  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  Last seen in follow-up on 10/30/2023, diabetes remained uncontrolled, she was initiated on Ozempic  and continued on Tresiba , NovoLog , and metformin .   Follows with cardiology for history of lower extremity edema, HTN, HLD.  Echo January 2024 showed normal biventricular function, grade 1 DD, no significant valve abnormalities.  Last seen in follow-up 08/07/2023, stable at that time, no changes to management, 1 year follow-up recommended.   Patient reports last dose of Ozempic  01/11/2024.  Preop CBC reviewed, WNL.  A1c 8.3.  BMP hemolyzed and will need to be redrawn day of surgery.  EKG 07/24/2023: Normal sinus rhythm.  Rate 88. Incomplete right bundle branch block  TTE 06/27/2022: 1. Left ventricular ejection fraction, by estimation, is 60 to 65%. The  left ventricle has normal function. The left ventricle has no regional  wall motion abnormalities. Left ventricular diastolic parameters are  consistent with Grade I diastolic  dysfunction (impaired relaxation).   2. Right ventricular systolic function is normal. The right ventricular  size is normal. Tricuspid regurgitation signal is inadequate for assessing  PA pressure.   3. The mitral valve is normal in structure. No evidence of mitral valve  regurgitation. No evidence of mitral stenosis.   4. The aortic valve is normal in  structure. Aortic valve regurgitation is  not visualized. No aortic stenosis is present.   5. The inferior vena cava is normal in size with greater than 50%  respiratory variability, suggesting right atrial pressure of 3 mmHg.      Lynwood Geofm RIGGERS [facility] Short Stay Center/Anesthesiology Phone [phone] 01/29/2024 9:05 AM

## 2024-02-01 ENCOUNTER — Ambulatory Visit: Admitting: Family Medicine

## 2024-02-01 NOTE — Progress Notes (Signed)
 SDW call. Updated patient on new surgery date and time of 02/06/2024 at 0750. NPO continues. No questions at this time.

## 2024-02-06 ENCOUNTER — Other Ambulatory Visit: Payer: Self-pay

## 2024-02-06 ENCOUNTER — Encounter (HOSPITAL_COMMUNITY): Payer: Self-pay | Admitting: Neurosurgery

## 2024-02-06 ENCOUNTER — Encounter (HOSPITAL_COMMUNITY)
Admission: RE | Disposition: A | Discharge status: Skilled Nursing Facility | Payer: Self-pay | Source: Home / Self Care | Attending: Neurosurgery

## 2024-02-06 ENCOUNTER — Inpatient Hospital Stay (HOSPITAL_COMMUNITY)
Admission: RE | Admit: 2024-02-06 | Discharge: 2024-02-21 | DRG: 451 | Disposition: A | Discharge status: Skilled Nursing Facility | Attending: Neurosurgery | Admitting: Neurosurgery

## 2024-02-06 ENCOUNTER — Ambulatory Visit (HOSPITAL_COMMUNITY)

## 2024-02-06 ENCOUNTER — Inpatient Hospital Stay (HOSPITAL_COMMUNITY): Payer: Self-pay | Admitting: Anesthesiology

## 2024-02-06 ENCOUNTER — Ambulatory Visit (HOSPITAL_COMMUNITY): Payer: Self-pay | Admitting: Physician Assistant

## 2024-02-06 DIAGNOSIS — E1142 Type 2 diabetes mellitus with diabetic polyneuropathy: Secondary | ICD-10-CM | POA: Diagnosis present

## 2024-02-06 DIAGNOSIS — Z8249 Family history of ischemic heart disease and other diseases of the circulatory system: Secondary | ICD-10-CM

## 2024-02-06 DIAGNOSIS — E1143 Type 2 diabetes mellitus with diabetic autonomic (poly)neuropathy: Secondary | ICD-10-CM | POA: Diagnosis present

## 2024-02-06 DIAGNOSIS — Z751 Person awaiting admission to adequate facility elsewhere: Secondary | ICD-10-CM

## 2024-02-06 DIAGNOSIS — Z8673 Personal history of transient ischemic attack (TIA), and cerebral infarction without residual deficits: Secondary | ICD-10-CM

## 2024-02-06 DIAGNOSIS — G4733 Obstructive sleep apnea (adult) (pediatric): Secondary | ICD-10-CM | POA: Diagnosis not present

## 2024-02-06 DIAGNOSIS — Z823 Family history of stroke: Secondary | ICD-10-CM

## 2024-02-06 DIAGNOSIS — K219 Gastro-esophageal reflux disease without esophagitis: Secondary | ICD-10-CM | POA: Diagnosis present

## 2024-02-06 DIAGNOSIS — Z91048 Other nonmedicinal substance allergy status: Secondary | ICD-10-CM

## 2024-02-06 DIAGNOSIS — K5901 Slow transit constipation: Secondary | ICD-10-CM | POA: Diagnosis present

## 2024-02-06 DIAGNOSIS — I340 Nonrheumatic mitral (valve) insufficiency: Secondary | ICD-10-CM | POA: Diagnosis not present

## 2024-02-06 DIAGNOSIS — E1151 Type 2 diabetes mellitus with diabetic peripheral angiopathy without gangrene: Secondary | ICD-10-CM | POA: Diagnosis present

## 2024-02-06 DIAGNOSIS — K592 Neurogenic bowel, not elsewhere classified: Secondary | ICD-10-CM | POA: Diagnosis present

## 2024-02-06 DIAGNOSIS — M4316 Spondylolisthesis, lumbar region: Secondary | ICD-10-CM

## 2024-02-06 DIAGNOSIS — Z89512 Acquired absence of left leg below knee: Secondary | ICD-10-CM

## 2024-02-06 DIAGNOSIS — E669 Obesity, unspecified: Secondary | ICD-10-CM | POA: Diagnosis present

## 2024-02-06 DIAGNOSIS — Z8261 Family history of arthritis: Secondary | ICD-10-CM

## 2024-02-06 DIAGNOSIS — Z6836 Body mass index (BMI) 36.0-36.9, adult: Secondary | ICD-10-CM

## 2024-02-06 DIAGNOSIS — Z794 Long term (current) use of insulin: Secondary | ICD-10-CM

## 2024-02-06 DIAGNOSIS — Z7409 Other reduced mobility: Secondary | ICD-10-CM | POA: Diagnosis present

## 2024-02-06 DIAGNOSIS — Z79899 Other long term (current) drug therapy: Secondary | ICD-10-CM

## 2024-02-06 DIAGNOSIS — M48062 Spinal stenosis, lumbar region with neurogenic claudication: Secondary | ICD-10-CM | POA: Diagnosis not present

## 2024-02-06 DIAGNOSIS — M47896 Other spondylosis, lumbar region: Secondary | ICD-10-CM | POA: Diagnosis not present

## 2024-02-06 DIAGNOSIS — Z01818 Encounter for other preprocedural examination: Secondary | ICD-10-CM

## 2024-02-06 DIAGNOSIS — K3184 Gastroparesis: Secondary | ICD-10-CM | POA: Diagnosis present

## 2024-02-06 DIAGNOSIS — Z981 Arthrodesis status: Secondary | ICD-10-CM | POA: Diagnosis not present

## 2024-02-06 DIAGNOSIS — M51369 Other intervertebral disc degeneration, lumbar region without mention of lumbar back pain or lower extremity pain: Principal | ICD-10-CM

## 2024-02-06 DIAGNOSIS — Z888 Allergy status to other drugs, medicaments and biological substances status: Secondary | ICD-10-CM

## 2024-02-06 DIAGNOSIS — Z7982 Long term (current) use of aspirin: Secondary | ICD-10-CM

## 2024-02-06 DIAGNOSIS — I10 Essential (primary) hypertension: Secondary | ICD-10-CM | POA: Diagnosis not present

## 2024-02-06 DIAGNOSIS — Z4789 Encounter for other orthopedic aftercare: Secondary | ICD-10-CM | POA: Diagnosis not present

## 2024-02-06 DIAGNOSIS — Z887 Allergy status to serum and vaccine status: Secondary | ICD-10-CM

## 2024-02-06 DIAGNOSIS — E785 Hyperlipidemia, unspecified: Secondary | ICD-10-CM | POA: Diagnosis present

## 2024-02-06 DIAGNOSIS — Z7984 Long term (current) use of oral hypoglycemic drugs: Secondary | ICD-10-CM

## 2024-02-06 DIAGNOSIS — Z91012 Allergy to eggs: Secondary | ICD-10-CM

## 2024-02-06 DIAGNOSIS — Z9181 History of falling: Secondary | ICD-10-CM

## 2024-02-06 DIAGNOSIS — Z91040 Latex allergy status: Secondary | ICD-10-CM

## 2024-02-06 HISTORY — PX: OTHER SURGICAL HISTORY: SHX169

## 2024-02-06 LAB — POCT I-STAT, CHEM 8
BUN: 22 mg/dL (ref 8–23)
Calcium, Ion: 1.2 mmol/L (ref 1.15–1.40)
Chloride: 102 mmol/L (ref 98–111)
Creatinine, Ser: 0.8 mg/dL (ref 0.44–1.00)
Glucose, Bld: 140 mg/dL — ABNORMAL HIGH (ref 70–99)
HCT: 38 % (ref 36.0–46.0)
Hemoglobin: 12.9 g/dL (ref 12.0–15.0)
Potassium: 4 mmol/L (ref 3.5–5.1)
Sodium: 136 mmol/L (ref 135–145)
TCO2: 25 mmol/L (ref 22–32)

## 2024-02-06 LAB — GLUCOSE, CAPILLARY
Glucose-Capillary: 193 mg/dL — ABNORMAL HIGH (ref 70–99)
Glucose-Capillary: 141 mg/dL — ABNORMAL HIGH (ref 70–99)
Glucose-Capillary: 166 mg/dL — ABNORMAL HIGH (ref 70–99)
Glucose-Capillary: 232 mg/dL — ABNORMAL HIGH (ref 70–99)

## 2024-02-06 LAB — BASIC METABOLIC PANEL WITH GFR
Anion gap: 15 (ref 5–15)
BUN: 19 mg/dL (ref 8–23)
CO2: 22 mmol/L (ref 22–32)
Calcium: 9.5 mg/dL (ref 8.9–10.3)
Chloride: 99 mmol/L (ref 98–111)
Creatinine, Ser: 0.63 mg/dL (ref 0.44–1.00)
GFR, Estimated: >60 mL/min (ref >60)
Glucose, Bld: 135 mg/dL — ABNORMAL HIGH (ref 70–99)
Potassium: 4 mmol/L (ref 3.5–5.1)
Sodium: 136 mmol/L (ref 135–145)

## 2024-02-06 SURGERY — POSTERIOR LUMBAR FUSION 1 LEVEL
Anesthesia: General

## 2024-02-06 MED ORDER — POTASSIUM CHLORIDE CRYS ER 20 MEQ PO TBCR: 20.0000 meq | EXTENDED_RELEASE_TABLET | Freq: Every day | ORAL | Status: DC | PRN

## 2024-02-06 MED ORDER — ONDANSETRON HCL 4 MG/2ML IJ SOLN
INTRAMUSCULAR | Status: AC
  Filled 2024-02-06: qty 2

## 2024-02-06 MED ORDER — LACTATED RINGERS IV SOLN: INTRAVENOUS | Status: DC | PRN

## 2024-02-06 MED ORDER — THROMBIN 20000 UNITS EX SOLR
CUTANEOUS | Status: DC | PRN
  Administered 2024-02-06: 20 mL via TOPICAL

## 2024-02-06 MED ORDER — DIAZEPAM 5 MG PO TABS: 5.0000 mg | ORAL_TABLET | Freq: Four times a day (QID) | ORAL | Status: DC | PRN

## 2024-02-06 MED ORDER — CHLORHEXIDINE GLUCONATE CLOTH 2 % EX PADS: 6.0000 | MEDICATED_PAD | Freq: Once | CUTANEOUS | Status: DC

## 2024-02-06 MED ORDER — MENTHOL 3 MG MT LOZG: 1.0000 | LOZENGE | OROMUCOSAL | Status: DC | PRN

## 2024-02-06 MED ORDER — 0.9 % SODIUM CHLORIDE (POUR BTL) OPTIME
TOPICAL | Status: DC | PRN
  Administered 2024-02-06: 1000 mL

## 2024-02-06 MED ORDER — FENTANYL CITRATE (PF) 250 MCG/5ML IJ SOLN
INTRAMUSCULAR | Status: DC | PRN
  Administered 2024-02-06: 100 ug via INTRAVENOUS
  Administered 2024-02-06 (×2): 50 ug via INTRAVENOUS

## 2024-02-06 MED ORDER — DROPERIDOL 2.5 MG/ML IJ SOLN: 0.6250 mg | Freq: Once | INTRAMUSCULAR | Status: DC | PRN

## 2024-02-06 MED ORDER — INSULIN GLARGINE 100 UNIT/ML ~~LOC~~ SOLN
24.0000 [IU] | Freq: Every day | SUBCUTANEOUS | Status: DC
  Administered 2024-02-06 – 2024-02-20 (×15): 24 [IU] via SUBCUTANEOUS
  Filled 2024-02-06 (×17): qty 0.24

## 2024-02-06 MED ORDER — FENOFIBRATE 160 MG PO TABS
160.0000 mg | ORAL_TABLET | Freq: Every day | ORAL | Status: DC
  Administered 2024-02-07 – 2024-02-21 (×15): 160 mg via ORAL
  Filled 2024-02-06 (×15): qty 1

## 2024-02-06 MED ORDER — HYDROMORPHONE HCL 1 MG/ML IJ SOLN: 0.2500 mg | INTRAMUSCULAR | Status: DC | PRN

## 2024-02-06 MED ORDER — SODIUM CHLORIDE 0.9% FLUSH
3.0000 mL | Freq: Two times a day (BID) | INTRAVENOUS | Status: DC
  Administered 2024-02-06 – 2024-02-14 (×16): 3 mL via INTRAVENOUS

## 2024-02-06 MED ORDER — GABAPENTIN 100 MG PO CAPS
200.0000 mg | ORAL_CAPSULE | Freq: Every day | ORAL | Status: DC
  Administered 2024-02-06 – 2024-02-20 (×15): 200 mg via ORAL
  Filled 2024-02-06 (×15): qty 2

## 2024-02-06 MED ORDER — LIDOCAINE 2% (20 MG/ML) 5 ML SYRINGE
INTRAMUSCULAR | Status: DC | PRN
  Administered 2024-02-06: 80 mg via INTRAVENOUS

## 2024-02-06 MED ORDER — OXYCODONE HCL 5 MG PO TABS
5.0000 mg | ORAL_TABLET | ORAL | Status: DC | PRN
  Administered 2024-02-11 – 2024-02-20 (×7): 5 mg via ORAL
  Filled 2024-02-06 (×6): qty 1

## 2024-02-06 MED ORDER — HYDROCHLOROTHIAZIDE 25 MG PO TABS
25.0000 mg | ORAL_TABLET | Freq: Every day | ORAL | Status: DC
  Administered 2024-02-07 – 2024-02-21 (×15): 25 mg via ORAL
  Filled 2024-02-06 (×15): qty 1

## 2024-02-06 MED ORDER — EPHEDRINE 5 MG/ML INJ
INTRAVENOUS | Status: AC
  Filled 2024-02-06: qty 5

## 2024-02-06 MED ORDER — PANTOPRAZOLE SODIUM 40 MG PO TBEC: 40.0000 mg | DELAYED_RELEASE_TABLET | Freq: Every day | ORAL | Status: DC | PRN

## 2024-02-06 MED ORDER — SODIUM CHLORIDE 0.9 % IV SOLN: INTRAVENOUS | Status: DC | PRN

## 2024-02-06 MED ORDER — OXYCODONE HCL 5 MG PO TABS
10.0000 mg | ORAL_TABLET | ORAL | Status: DC | PRN
  Administered 2024-02-06 – 2024-02-20 (×30): 10 mg via ORAL
  Filled 2024-02-06 (×31): qty 2

## 2024-02-06 MED ORDER — SENNA 8.6 MG PO TABS
1.0000 | ORAL_TABLET | Freq: Two times a day (BID) | ORAL | Status: DC
  Administered 2024-02-06 – 2024-02-19 (×24): 8.6 mg via ORAL
  Filled 2024-02-06 (×28): qty 1

## 2024-02-06 MED ORDER — SODIUM CHLORIDE 0.9 % IV SOLN
250.0000 mL | INTRAVENOUS | Status: AC
  Administered 2024-02-06: 250 mL via INTRAVENOUS

## 2024-02-06 MED ORDER — DEXMEDETOMIDINE HCL IN NACL 80 MCG/20ML IV SOLN
INTRAVENOUS | Status: DC | PRN
Start: 2024-02-06 — End: 2024-02-06
  Administered 2024-02-06: 4 ug via INTRAVENOUS
  Administered 2024-02-06 (×2): 8 ug via INTRAVENOUS

## 2024-02-06 MED ORDER — BUPIVACAINE HCL (PF) 0.5 % IJ SOLN
INTRAMUSCULAR | Status: AC
  Filled 2024-02-06: qty 30

## 2024-02-06 MED ORDER — METFORMIN HCL ER 500 MG PO TB24
500.0000 mg | ORAL_TABLET | Freq: Three times a day (TID) | ORAL | Status: DC
  Administered 2024-02-08 – 2024-02-21 (×42): 500 mg via ORAL
  Filled 2024-02-06 (×43): qty 1

## 2024-02-06 MED ORDER — LIDOCAINE-EPINEPHRINE 0.5 %-1:200000 IJ SOLN
INTRAMUSCULAR | Status: AC
  Filled 2024-02-06: qty 50

## 2024-02-06 MED ORDER — EZETIMIBE 10 MG PO TABS
10.0000 mg | ORAL_TABLET | Freq: Every day | ORAL | Status: DC
  Administered 2024-02-07 – 2024-02-21 (×15): 10 mg via ORAL
  Filled 2024-02-06 (×15): qty 1

## 2024-02-06 MED ORDER — CEFAZOLIN SODIUM 1 G IJ SOLR
INTRAMUSCULAR | Status: AC
Start: 2024-02-06 — End: 2024-02-06
  Filled 2024-02-06: qty 20

## 2024-02-06 MED ORDER — ALBUTEROL SULFATE (2.5 MG/3ML) 0.083% IN NEBU: 2.5000 mg | INHALATION_SOLUTION | RESPIRATORY_TRACT | Status: DC | PRN

## 2024-02-06 MED ORDER — THROMBIN 20000 UNITS EX SOLR
CUTANEOUS | Status: AC
  Filled 2024-02-06: qty 20000

## 2024-02-06 MED ORDER — PROPOFOL 10 MG/ML IV BOLUS
INTRAVENOUS | Status: DC | PRN
  Administered 2024-02-06: 120 mg via INTRAVENOUS
  Administered 2024-02-06: 25 ug/kg/min via INTRAVENOUS

## 2024-02-06 MED ORDER — ALBUTEROL SULFATE HFA 108 (90 BASE) MCG/ACT IN AERS: 2.0000 | INHALATION_SPRAY | RESPIRATORY_TRACT | Status: DC | PRN

## 2024-02-06 MED ORDER — POLYETHYLENE GLYCOL 3350 17 G PO PACK: 17.0000 g | PACK | Freq: Every day | ORAL | Status: DC | PRN

## 2024-02-06 MED ORDER — PHENYLEPHRINE 80 MCG/ML (10ML) SYRINGE FOR IV PUSH (FOR BLOOD PRESSURE SUPPORT)
PREFILLED_SYRINGE | INTRAVENOUS | Status: AC
  Filled 2024-02-06: qty 10

## 2024-02-06 MED ORDER — COMPLETENATE 29-1 MG PO CHEW
1.0000 | CHEWABLE_TABLET | Freq: Every day | ORAL | Status: DC
  Administered 2024-02-07 – 2024-02-21 (×15): 1 via ORAL
  Filled 2024-02-06 (×16): qty 1

## 2024-02-06 MED ORDER — ACETAMINOPHEN 500 MG PO TABS
1000.0000 mg | ORAL_TABLET | Freq: Once | ORAL | Status: AC
  Administered 2024-02-06: 1000 mg via ORAL
  Filled 2024-02-06: qty 2

## 2024-02-06 MED ORDER — GLYCOPYRROLATE 0.2 MG/ML IJ SOLN
INTRAMUSCULAR | Status: DC | PRN
  Administered 2024-02-06 (×2): .1 mg via INTRAVENOUS

## 2024-02-06 MED ORDER — SUGAMMADEX SODIUM 200 MG/2ML IV SOLN
INTRAVENOUS | Status: DC | PRN
  Administered 2024-02-06: 200 mg via INTRAVENOUS

## 2024-02-06 MED ORDER — PROPOFOL 10 MG/ML IV BOLUS
INTRAVENOUS | Status: AC
  Filled 2024-02-06: qty 20

## 2024-02-06 MED ORDER — DEXAMETHASONE SODIUM PHOSPHATE 10 MG/ML IJ SOLN
INTRAMUSCULAR | Status: AC
  Filled 2024-02-06: qty 1

## 2024-02-06 MED ORDER — ORAL CARE MOUTH RINSE: 15.0000 mL | Freq: Once | OROMUCOSAL | Status: AC

## 2024-02-06 MED ORDER — DEXAMETHASONE SODIUM PHOSPHATE 10 MG/ML IJ SOLN
INTRAMUSCULAR | Status: DC | PRN
  Administered 2024-02-06: 5 mg via INTRAVENOUS

## 2024-02-06 MED ORDER — ROCURONIUM BROMIDE 10 MG/ML (PF) SYRINGE
PREFILLED_SYRINGE | INTRAVENOUS | Status: AC
  Filled 2024-02-06: qty 10

## 2024-02-06 MED ORDER — INSULIN ASPART 100 UNIT/ML IJ SOLN: 6.0000 [IU] | Freq: Once | INTRAMUSCULAR | Status: AC

## 2024-02-06 MED ORDER — PHENOL 1.4 % MT LIQD: 1.0000 | OROMUCOSAL | Status: DC | PRN

## 2024-02-06 MED ORDER — FENTANYL CITRATE (PF) 250 MCG/5ML IJ SOLN
INTRAMUSCULAR | Status: AC
  Filled 2024-02-06: qty 5

## 2024-02-06 MED ORDER — SODIUM CHLORIDE 0.9% FLUSH: 3.0000 mL | INTRAVENOUS | Status: DC | PRN

## 2024-02-06 MED ORDER — INSULIN ASPART 100 UNIT/ML IJ SOLN
0.0000 [IU] | INTRAMUSCULAR | Status: DC | PRN
  Administered 2024-02-06: 4 [IU] via SUBCUTANEOUS

## 2024-02-06 MED ORDER — DULOXETINE HCL 60 MG PO CPEP
120.0000 mg | ORAL_CAPSULE | Freq: Every day | ORAL | Status: DC
  Administered 2024-02-06 – 2024-02-20 (×15): 120 mg via ORAL
  Filled 2024-02-06 (×15): qty 2

## 2024-02-06 MED ORDER — CEFAZOLIN SODIUM-DEXTROSE 2-4 GM/100ML-% IV SOLN
2.0000 g | INTRAVENOUS | Status: AC
  Administered 2024-02-06 (×2): 2 g via INTRAVENOUS
  Filled 2024-02-06: qty 100

## 2024-02-06 MED ORDER — POTASSIUM CHLORIDE IN NACL 20-0.9 MEQ/L-% IV SOLN
INTRAVENOUS | Status: DC
  Filled 2024-02-06 (×3): qty 1000

## 2024-02-06 MED ORDER — INSULIN ASPART 100 UNIT/ML IJ SOLN
INTRAMUSCULAR | Status: AC
  Administered 2024-02-06: 6 [IU] via SUBCUTANEOUS
  Filled 2024-02-06: qty 1

## 2024-02-06 MED ORDER — PHENYLEPHRINE 80 MCG/ML (10ML) SYRINGE FOR IV PUSH (FOR BLOOD PRESSURE SUPPORT)
PREFILLED_SYRINGE | INTRAVENOUS | Status: DC | PRN
  Administered 2024-02-06: 80 ug via INTRAVENOUS
  Administered 2024-02-06: 120 ug via INTRAVENOUS
  Administered 2024-02-06 (×3): 80 ug via INTRAVENOUS
  Administered 2024-02-06 (×2): 120 ug via INTRAVENOUS

## 2024-02-06 MED ORDER — LACTATED RINGERS IV SOLN: INTRAVENOUS | Status: DC

## 2024-02-06 MED ORDER — FUROSEMIDE 20 MG PO TABS: 10.0000 mg | ORAL_TABLET | Freq: Every day | ORAL | Status: DC | PRN

## 2024-02-06 MED ORDER — ACETAMINOPHEN 650 MG RE SUPP
650.0000 mg | RECTAL | Status: DC | PRN
Start: 2024-02-06 — End: 2024-02-21

## 2024-02-06 MED ORDER — ROCURONIUM BROMIDE 10 MG/ML (PF) SYRINGE
PREFILLED_SYRINGE | INTRAVENOUS | Status: DC | PRN
  Administered 2024-02-06 (×4): 20 mg via INTRAVENOUS
  Administered 2024-02-06: 60 mg via INTRAVENOUS
  Administered 2024-02-06 (×3): 20 mg via INTRAVENOUS

## 2024-02-06 MED ORDER — CHLORHEXIDINE GLUCONATE 0.12 % MT SOLN
15.0000 mL | Freq: Once | OROMUCOSAL | Status: AC
  Administered 2024-02-06: 15 mL via OROMUCOSAL
  Filled 2024-02-06: qty 15

## 2024-02-06 MED ORDER — LIDOCAINE 2% (20 MG/ML) 5 ML SYRINGE
INTRAMUSCULAR | Status: AC
  Filled 2024-02-06: qty 5

## 2024-02-06 MED ORDER — ASPIRIN 325 MG PO TBEC
325.0000 mg | DELAYED_RELEASE_TABLET | Freq: Every day | ORAL | Status: DC
  Administered 2024-02-06 – 2024-02-21 (×16): 325 mg via ORAL
  Filled 2024-02-06 (×16): qty 1

## 2024-02-06 MED ORDER — ONDANSETRON 4 MG PO TBDP: 4.0000 mg | ORAL_TABLET | Freq: Three times a day (TID) | ORAL | Status: DC | PRN

## 2024-02-06 MED ORDER — ACETAMINOPHEN 325 MG PO TABS
650.0000 mg | ORAL_TABLET | ORAL | Status: DC | PRN
  Administered 2024-02-19: 650 mg via ORAL
  Filled 2024-02-06: qty 2

## 2024-02-06 MED ORDER — MULTIVITAMIN GUMMIES WOMENS PO CHEW: CHEWABLE_TABLET | Freq: Every day | ORAL | Status: DC

## 2024-02-06 MED ORDER — LIDOCAINE-EPINEPHRINE 0.5 %-1:200000 IJ SOLN
INTRAMUSCULAR | Status: DC | PRN
  Administered 2024-02-06: 10 mL

## 2024-02-06 MED ORDER — PHENYLEPHRINE HCL-NACL 20-0.9 MG/250ML-% IV SOLN
INTRAVENOUS | Status: DC | PRN
Start: 2024-02-06 — End: 2024-02-06
  Administered 2024-02-06: 30 ug/min via INTRAVENOUS

## 2024-02-06 MED ORDER — INSULIN ASPART 100 UNIT/ML IJ SOLN
8.0000 [IU] | INTRAMUSCULAR | Status: DC
  Administered 2024-02-07 – 2024-02-21 (×29): 8 [IU] via SUBCUTANEOUS

## 2024-02-06 MED ORDER — BUPIVACAINE HCL (PF) 0.5 % IJ SOLN
INTRAMUSCULAR | Status: DC | PRN
  Administered 2024-02-06: 30 mL

## 2024-02-06 MED ORDER — GABAPENTIN 400 MG PO CAPS
800.0000 mg | ORAL_CAPSULE | Freq: Two times a day (BID) | ORAL | Status: DC
  Administered 2024-02-07 – 2024-02-21 (×30): 800 mg via ORAL
  Filled 2024-02-06 (×30): qty 2

## 2024-02-06 MED ORDER — ONDANSETRON HCL 4 MG/2ML IJ SOLN
INTRAMUSCULAR | Status: DC | PRN
  Administered 2024-02-06: 4 mg via INTRAVENOUS

## 2024-02-06 MED ORDER — INSULIN DEGLUDEC 200 UNIT/ML ~~LOC~~ SOPN: 24.0000 [IU] | PEN_INJECTOR | Freq: Every day | SUBCUTANEOUS | Status: DC

## 2024-02-06 SURGICAL SUPPLY — 58 items
BAG COUNTER SPONGE SURGICOUNT (BAG) ×1 IMPLANT
BASKET BONE COLLECTION (BASKET) ×1 IMPLANT
BENZOIN TINCTURE PRP APPL 2/3 (GAUZE/BANDAGES/DRESSINGS) IMPLANT
BIT DRILL PLIF MAS DISP 5.5MM (DRILL) IMPLANT
BLADE BONE MILL MEDIUM (MISCELLANEOUS) ×1 IMPLANT
BLADE CLIPPER SURG (BLADE) IMPLANT
BUR MATCHSTICK NEURO 3.0 LAGG (BURR) ×1 IMPLANT
BUR PRECISION FLUTE 5.0 (BURR) ×1 IMPLANT
CAGE SABLE 10X22 6-12 8D (Cage) IMPLANT
CANISTER SUCTION 3000ML PPV (SUCTIONS) ×1 IMPLANT
CNTNR URN SCR LID CUP LEK RST (MISCELLANEOUS) ×1 IMPLANT
COVER BACK TABLE 60X90IN (DRAPES) ×1 IMPLANT
DERMABOND ADVANCED .7 DNX12 (GAUZE/BANDAGES/DRESSINGS) ×1 IMPLANT
DRAPE C-ARM 42X72 X-RAY (DRAPES) ×2 IMPLANT
DRAPE C-ARMOR (DRAPES) IMPLANT
DRAPE LAPAROTOMY 100X72X124 (DRAPES) ×1 IMPLANT
DRAPE SURG 17X23 STRL (DRAPES) ×1 IMPLANT
DRSG OPSITE POSTOP 4X8 (GAUZE/BANDAGES/DRESSINGS) IMPLANT
DURAPREP 26ML APPLICATOR (WOUND CARE) ×1 IMPLANT
ELECTRODE REM PT RTRN 9FT ADLT (ELECTROSURGICAL) ×1 IMPLANT
GAUZE 4X4 16PLY ~~LOC~~+RFID DBL (SPONGE) IMPLANT
GAUZE SPONGE 4X4 12PLY STRL (GAUZE/BANDAGES/DRESSINGS) IMPLANT
GLOVE EXAM NITRILE XL STR (GLOVE) IMPLANT
GLOVE SURG LTX SZ6.5 (GLOVE) ×2 IMPLANT
GOWN STRL REUS W/ TWL LRG LVL3 (GOWN DISPOSABLE) ×2 IMPLANT
GOWN STRL REUS W/ TWL XL LVL3 (GOWN DISPOSABLE) IMPLANT
GOWN STRL REUS W/TWL 2XL LVL3 (GOWN DISPOSABLE) IMPLANT
KIT BASIN OR (CUSTOM PROCEDURE TRAY) ×1 IMPLANT
KIT POSITIONER JACKSON TABLE (MISCELLANEOUS) ×1 IMPLANT
KIT TURNOVER KIT B (KITS) ×1 IMPLANT
MILL BONE PREP (MISCELLANEOUS) IMPLANT
NDL HYPO 25X1 1.5 SAFETY (NEEDLE) ×1 IMPLANT
NDL SPNL 18GX3.5 QUINCKE PK (NEEDLE) IMPLANT
NEEDLE HYPO 25X1 1.5 SAFETY (NEEDLE) ×1 IMPLANT
NEEDLE SPNL 18GX3.5 QUINCKE PK (NEEDLE) IMPLANT
NS IRRIG 1000ML POUR BTL (IV SOLUTION) ×1 IMPLANT
PACK LAMINECTOMY NEURO (CUSTOM PROCEDURE TRAY) ×1 IMPLANT
PAD ARMBOARD POSITIONER FOAM (MISCELLANEOUS) ×2 IMPLANT
PUTTY DBM INSTAFILL CART 5CC (Putty) IMPLANT
ROD 35MM (Rod) IMPLANT
ROD 5.5X40MM (Rod) IMPLANT
SCREW LOCK FXNS SPNE MAS PL (Screw) IMPLANT
SCREW SHANK 5.5X30MM (Screw) IMPLANT
SCREW SHANKS 5.5X35 (Screw) IMPLANT
SCREW TULIP 5.5 (Screw) IMPLANT
SPIKE FLUID TRANSFER (MISCELLANEOUS) ×1 IMPLANT
SPONGE SURGIFOAM ABS GEL 100 (HEMOSTASIS) ×1 IMPLANT
SPONGE T-LAP 4X18 ~~LOC~~+RFID (SPONGE) IMPLANT
STRIP CLOSURE SKIN 1/2X4 (GAUZE/BANDAGES/DRESSINGS) IMPLANT
SUT PROLENE 6 0 BV (SUTURE) IMPLANT
SUT VIC AB 0 CT1 18XCR BRD8 (SUTURE) ×1 IMPLANT
SUT VIC AB 2-0 CT1 18 (SUTURE) ×1 IMPLANT
SUT VIC AB 3-0 SH 8-18 (SUTURE) ×1 IMPLANT
TIP CONICAL INSTAFILL (ORTHOPEDIC DISPOSABLE SUPPLIES) IMPLANT
TOWEL GREEN STERILE (TOWEL DISPOSABLE) ×1 IMPLANT
TOWEL GREEN STERILE FF (TOWEL DISPOSABLE) ×1 IMPLANT
TRAY FOLEY MTR SLVR 16FR STAT (SET/KITS/TRAYS/PACK) ×1 IMPLANT
WATER STERILE IRR 1000ML POUR (IV SOLUTION) ×1 IMPLANT

## 2024-02-06 NOTE — Progress Notes (Signed)
 Per Dr. Darlyn, continue peri-op gylcemic control protocol with surgery being greater than 4 hours.   Joesph LOISE Stake, RN

## 2024-02-06 NOTE — Anesthesia Procedure Notes (Signed)
 Procedure Name: Intubation Date/Time: 02/06/2024 11:06 AM  Performed by: Vanice Search, RNPre-anesthesia Checklist: Patient identified, Emergency Drugs available, Suction available and Patient being monitored Patient Re-evaluated:Patient Re-evaluated prior to induction Oxygen Delivery Method: Circle System Utilized Preoxygenation: Pre-oxygenation with 100% oxygen Induction Type: IV induction Ventilation: Mask ventilation without difficulty Laryngoscope Size: Glidescope and 3 Grade View: Grade I Tube type: Oral Tube size: 6.5 mm Number of attempts: 1 Airway Equipment and Method: Stylet and Oral airway Placement Confirmation: ETT inserted through vocal cords under direct vision, positive ETCO2 and breath sounds checked- equal and bilateral Secured at: 21 cm Tube secured with: Tape Dental Injury: Teeth and Oropharynx as per pre-operative assessment

## 2024-02-06 NOTE — Transfer of Care (Signed)
 Immediate Anesthesia Transfer of Care Note  Patient: Olivia Romero  Procedure(s) Performed: posterior lumbar interbody arthrodesis lumbar three- lumbar four, hardware removal lumbar five, nonsegmental pedicle fixation lumbar three-lumbar four  Patient Location: PACU  Anesthesia Type:General  Level of Consciousness: awake and drowsy  Airway & Oxygen Therapy: Patient Spontanous Breathing and Patient connected to face mask oxygen  Post-op Assessment: Report given to RN and Post -op Vital signs reviewed and stable  Post vital signs: Reviewed and stable  Last Vitals:  Vitals Value Taken Time  BP 120/46 02/06/24 16:08  Temp    Pulse 106 02/06/24 16:12  Resp 18 02/06/24 16:12  SpO2 96 % 02/06/24 16:12  Vitals shown include unfiled device data.  Last Pain:  Vitals:   02/06/24 0813  TempSrc:   PainSc: 0-No pain         Complications: There were no known notable events for this encounter.

## 2024-02-06 NOTE — Care Plan (Signed)
 Patient arrived from PACU via bed. On 10/10 pain, on 1 LPM via nasal cannula. Call bell within reached. Bed alarm on. Assessed accordingly

## 2024-02-06 NOTE — Op Note (Signed)
 02/06/2024  5:28 PM  PATIENT:  Olivia Romero  68 y.o. 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 She is also retrolisthesed at L3/4. 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 female whom was taken to the operating room intubated, and placed under a general anesthetic without difficulty. A foley catheter was placed under sterile conditions. She was positioned prone on a Jackson table with all pressure points properly padded.  Her lumbar region was prepped and draped in a sterile manner. I infiltrated 10cc's 1/2%lidocaine /1:2000,000 strength epinephrine  into the planned incision. I opened the skin with a 10 blade and took the incision down to the thoracolumbar fascia. I exposed the lamina of L3 in a subperiosteal fashion bilaterally. I confirmed my location with an intraoperative xray.  I placed self retaining retractors and started the decompression.  I decompressed the spinal canal via inferior L3 facetectomies and partial superior L4 facetectomies. I unroofed the L3 nerve roots and decompressed them thru the lateral recesses and the foramina using the drill and Kerrison punches. I removed far more of the lamina than the needed amount to perform a  PLIF.  PLIF's were performed at L3/4 in the same fashion. I opened the disc space with a 15 blade then used a variety of instruments to remove the disc and prepare the space for the arthrodesis. I used curettes, rongeurs, punches, shavers for the disc space, and rasps in the discetomy. I measured the disc space and placed expandable titanium   Sable cages(Globus) into the disc space(s). I packed the disc spaces with autograft morsels, and the cages with allograft morsels.    I placed pedicle screws at L3, using fluoroscopic guidance. I drilled a pilot hole, then cannulated the pedicle with a drill at each site. I then tapped each pedicle, assessing each site for pedicle violations. No cutouts were appreciated. Screws (nuvasive) were then placed at each site without difficulty. I exposed the existing hardware. I removed the locking caps, then the rods. I removed the L5 screws as she has a solid arthrodesis at L4/5. I left the L4 screws in place and I attached rods and locking caps with the appropriate tools. The locking caps were secured with torque limited screwdrivers. Final films were performed and the final construct appeared to be in good position.  I closed the wound in a layered fashion. I approximated the thoracolumbar fascia, subcutaneous, and subcuticular planes with vicryl sutures. I used dermabond and an occlusive bandage for a sterile dressing.     PLAN OF CARE: Admit to inpatient   PATIENT DISPOSITION:  PACU - hemodynamically stable.   Delay start of Pharmacological VTE agent (>24hrs) due to surgical blood loss or risk of bleeding:  yes

## 2024-02-06 NOTE — H&P (Signed)
 BP (!) 145/68   Pulse 99   Temp 98.5 F (36.9 C) (Oral)   Resp 20   Ht 4' 11 (1.499 m)   Wt 82.6 kg   LMP  (LMP Unknown)   SpO2 98%   BMI 36.76 kg/m  Olivia Romero returns today with the MRI of the lumbar spine.  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  Does have a listhesis.  I am sure the problems she is experiencing now are consistent with neurogenic claudication.  She is desperate, however, to avoid an operation.  Will try a muscle relaxant, as she says she had a muscle spasm just recently which almost brought her to tears. Olivia Romero returns today because she elected to go ahead and proceed with adjacent segment decompression and arthrodesis at L3-4.  She says since the 17th of July when I saw her, the pain has gotten significantly worse.  Given that, we will just go ahead with this extension of the arthrodesis.  The decompression obviously is the most important thing and we will get that done.  She understands having had this operation previously.  We will get this arranged for her as soon as possible. Allergies  Allergen Reactions   Egg-Derived Products Swelling and Other (See Comments)    Angioedema   Atorvastatin Rash    Rash itching    Tirzepatide  Other (See Comments)   Influenza Virus Vaccine Swelling    Arm swelled (site of injection)   Latex Itching   Pravastatin Itching and Rash   Tape Rash and Other (See Comments)    Adhesive on foot pull off skin.  Ok to use paper tape.   Past Medical History:  Diagnosis Date   Anemia    Arthritis    Asthma    Colon polyps    adenomatous   Diabetes mellitus without complication (HCC)    Diabetic infection of left foot (HCC) 12/2018   Diverticulosis of colon    Dysphagia 12/31/2014   Esophagitis    GERD (gastroesophageal reflux disease)    Hemorrhoid    internal   Hyperlipemia    Hypertension    IBS (irritable bowel syndrome)    no current prob - diet controlled   MRSA  bacteremia 12/05/2018   Myalgia due to statin 11/19/2017   Neuropathy    Neuropathy of both feet    Neuropathy of hand    Pneumonia    x 4   PONV (postoperative nausea and vomiting)    on some surgeries but not all procedures   Skin ulcer of right ankle, limited to breakdown of skin (HCC) 12/31/2016   resolved per patient 05/14/19   Sleep apnea    has had in the past lost 50 pounds and do longer uses cpap   Statin intolerance 04/23/2015   Stroke (HCC) 2009   mini stroke per patient   Stroke-like episode 2009   TIA - mini stroke per patient   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 07/13/2015   Uncontrolled type 2 diabetes mellitus with hyperglycemia (HCC) 11/01/2017   Past Surgical History:  Procedure Laterality Date   ABDOMINAL HYSTERECTOMY     AMPUTATION Left 12/06/2018   Procedure: PARTIAL AMPUTATION LEFT FOOT;  Surgeon: Janit Thresa HERO, DPM;  Location: [facility] OR;  Service: Podiatry;  Laterality: Left;   AMPUTATION Left 05/16/2019   Procedure: LEFT BELOW KNEE AMPUTATION;  Surgeon: Harden Jerona GAILS, MD;  Location[facility] OR;  Service: Orthopedics;  Laterality: Left;   APPENDECTOMY     removed in 11th grade   BACK SURGERY  09/02/2015   BONE BIOPSY Left 12/06/2018   Procedure: Bone Biopsy;  Surgeon: Janit Thresa HERO, DPM;  Location: [facility] OR;  Service: Podiatry;  Laterality: Left;   CERVICAL FUSION     CESAREAN SECTION  1986   COLONOSCOPY N/A 11/30/2012   Procedure: COLONOSCOPY;  Surgeon: Norleen LOISE Kiang, MD;  Location: [facility] ENDOSCOPY;  Service: Endoscopy;  Laterality: N/A;   COLONOSCOPY N/A 09/05/2021   Procedure: COLONOSCOPY;  Surgeon: Maryruth Ole DASEN, MD;  Location: [facility] ENDOSCOPY;  Service: Endoscopy;  Laterality: N/A;   COLONOSCOPY WITH PROPOFOL  N/A 01/03/2016   Procedure: COLONOSCOPY WITH PROPOFOL ;  Surgeon: Lamar DASEN Holmes, MD;  Location: [facility] ENDOSCOPY;  Service: Endoscopy;  Laterality: N/A;   ESOPHAGOGASTRODUODENOSCOPY N/A 09/05/2021   Procedure:  ESOPHAGOGASTRODUODENOSCOPY (EGD);  Surgeon: Maryruth Ole DASEN, MD;  Location: [facility] ENDOSCOPY;  Service: Endoscopy;  Laterality: N/A;   ESOPHAGOGASTRODUODENOSCOPY (EGD) WITH PROPOFOL  N/A 01/14/2015   Procedure: ESOPHAGOGASTRODUODENOSCOPY (EGD) WITH PROPOFOL ;  Surgeon: Lamar DASEN Holmes, MD;  Location: [facility] ENDOSCOPY;  Service: Endoscopy;  Laterality: N/A;   IRRIGATION AND DEBRIDEMENT FOOT Left 03/13/2018   Procedure: IRRIGATION AND DEBRIDEMENT FOOT WITH BONE BIOPSY WITH MISONIX DEBRIDER;  Surgeon: Gretel Ozell PARAS, DPM;  Location: [facility] OR;  Service: Podiatry;  Laterality: Left;   IRRIGATION AND DEBRIDEMENT FOOT Left 12/06/2018   Procedure: Irrigation And Debridement Foot;  Surgeon: Janit Thresa HERO, DPM;  Location: [facility] OR;  Service: Podiatry;  Laterality: Left;   LUMBAR WOUND DEBRIDEMENT N/A 10/01/2015   Procedure: LUMBAR WOUND DEBRIDEMENT;  Surgeon: Rockey Peru, MD;  Location: [facility] NEURO ORS;  Service: Neurosurgery;  Laterality: N/A;  LUMBAR WOUND DEBRIDEMENT   NASAL SINUS SURGERY     SAVORY DILATION N/A 01/14/2015   Procedure: SAVORY DILATION;  Surgeon: Lamar DASEN Holmes, MD;  Location: [facility] ENDOSCOPY;  Service: Endoscopy;  Laterality: N/A;   TONSILLECTOMY     WISDOM TOOTH EXTRACTION     Family History  Problem Relation Age of Onset   Lung cancer Father    Hypertension Father    Arthritis Father    Other Mother        hardening of the arteries/renal cell carcenoma   Hypertension Mother    Stroke Mother    Kidney cancer Mother    Arthritis Brother    Rheum arthritis Maternal Uncle    Bladder Cancer Neg Hx    Social History   Socioeconomic History   Marital status: Single    Spouse name: Not on file   Number of children: 1   Years of education: Not on file   Highest education level: Not on file  Occupational History   Occupation: disabled  Tobacco Use   Smoking status: Never   Smokeless tobacco: Never  Vaping Use   Vaping status: Never Used  Substance and Sexual Activity    Alcohol use: Never   Drug use: No   Sexual activity: Not Currently    Comment: Hysterectomy  Other Topics Concern   Not on file  Social History Narrative   Not on file   Social Drivers of Health   Financial Resource Strain: Low Risk  (10/27/2022)   Overall Financial Resource Strain (CARDIA)    Difficulty of Paying Living Expenses: Not hard at all  Food Insecurity: No Food Insecurity (10/27/2022)   Hunger Vital Sign    Worried About Running Out of Food in the Last Year: Never true  Ran Out of Food in the Last Year: Never true  Transportation Needs: No Transportation Needs (10/27/2022)   PRAPARE - Administrator, Civil Service (Medical): No    Lack of Transportation (Non-Medical): No  Physical Activity: Sufficiently Active (10/27/2022)   Exercise Vital Sign    Days of Exercise per Week: 7 days    Minutes of Exercise per Session: 30 min  Stress: No Stress Concern Present (10/27/2022)   [employer] of Occupational Health - Occupational Stress Questionnaire    Feeling of Stress : Not at all  Social Connections: Socially Integrated (10/27/2022)   Social Connection and Isolation Panel    Frequency of Communication with Friends and Family: More than three times a week    Frequency of Social Gatherings with Friends and Family: More than three times a week    Attends Religious Services: More than 4 times per year    Active Member of [employer] or Organizations: Yes    Attends Banker Meetings: More than 4 times per year    Marital Status: Married  Catering manager Violence: Not At Risk (10/27/2022)   Humiliation, Afraid, Rape, and Kick questionnaire    Fear of Current or Ex-Partner: No    Emotionally Abused: No    Physically Abused: No    Sexually Abused: No   Prior to Admission medications   Medication Sig Start Date End Date Taking? Authorizing Provider  acetaminophen  (TYLENOL ) 500 MG tablet Take 1,000 mg by mouth every 6 (six) hours as needed for mild pain  (pain score 1-3). 09/17/18  Yes Lada, Newell SQUIBB, MD  aspirin  EC 325 MG tablet Take 1 tablet (325 mg total) by mouth daily. 03/11/18  Yes Sowles, Krichna, MD  DULoxetine  (CYMBALTA ) 60 MG capsule Take 120 mg by mouth at bedtime. 07/01/20  Yes [provider]  ezetimibe  (ZETIA ) 10 MG tablet TAKE ONE TABLET (10 MG TOTAL) BY MOUTH DAILY. 12/20/23  Yes Tapia, Leisa, PA-C  fenofibrate  (TRICOR ) 145 MG tablet TAKE ONE TABLET (145 MG TOTAL) BY MOUTH DAILY. 12/20/23  Yes Tapia, Leisa, PA-C  furosemide  (LASIX ) 20 MG tablet Take 0.5-1 tablets (10-20 mg total) by mouth daily as needed. For severe fluid retention in lower extremity.  Take in am with potassium PRN 09/29/22  Yes Tapia, Leisa, PA-C  gabapentin  (NEURONTIN ) 100 MG capsule Take 200 mg by mouth at bedtime. 05/19/20  Yes [provider]  gabapentin  (NEURONTIN ) 800 MG tablet Take 800 mg by mouth 2 (two) times daily. 09/16/21  Yes [provider]  hydrochlorothiazide  (HYDRODIURIL ) 25 MG tablet TAKE ONE TABLET (25 MG TOTAL) BY MOUTH DAILY. 12/20/23  Yes Tapia, Leisa, PA-C  insulin  aspart (NOVOLOG ) 100 UNIT/ML injection Inject 8 Units into the skin in the morning and at bedtime.   Yes [provider]  insulin  degludec (TRESIBA ) 200 UNIT/ML FlexTouch Pen Inject 24 Units into the skin at bedtime.   Yes [provider]  metFORMIN  (GLUCOPHAGE -XR) 500 MG 24 hr tablet Take 500 mg by mouth 3 (three) times daily.  01/01/19  Yes [provider]  Multiple Vitamins-Minerals (MULTIVITAMIN GUMMIES WOMENS PO) Take 1 tablet by mouth daily.   Yes [provider]  pantoprazole  (PROTONIX ) 40 MG tablet Take 1 tablet (40 mg total) by mouth daily. Patient taking differently: Take 40 mg by mouth daily as needed (acid reflux). 01/15/23  Yes Tapia, Leisa, PA-C  albuterol  (VENTOLIN  HFA) 108 (90 Base) MCG/ACT inhaler Inhale 2 puffs into the lungs every 4 (four) hours as  needed for wheezing or shortness of breath. 07/11/23   Tapia,  Leisa, PA-C  beclomethasone (QVAR  REDIHALER) 80 MCG/ACT inhaler Inhale 2 puffs into the lungs 2 (two) times daily. Patient not taking: Reported on 01/24/2024 07/11/23   Tapia, Leisa, PA-C  benzonatate  (TESSALON  PERLES) 100 MG capsule Take 2 capsules (200 mg total) by mouth 3 (three) times daily as needed for cough. Patient not taking: Reported on 01/24/2024 07/18/23   Pender, Julie F, FNP  Blood Glucose Monitoring Suppl (CONTOUR NEXT MONITOR) w/Device KIT as directed. 06/29/17   [provider]  colchicine  0.6 MG tablet Take 1 tablet (0.6 mg total) by mouth daily. Patient taking differently: Take 0.6 mg by mouth daily as needed (gout flare). 06/28/23   Gershon Donnice SAUNDERS, DPM  Incontinence Supply Disposable (ASSURANCE FITTED BRIEF LARGE) MISC Use 1 brief up to 6 times a day as needed for urinary incontinence 10/16/19   Tapia, Leisa, PA-C  meclizine  (ANTIVERT ) 25 MG tablet Take 0.5-1 tablets (12.5-25 mg total) by mouth 3 (three) times daily as needed for dizziness (vertigo sx). Patient not taking: Reported on 01/24/2024 12/18/22   Tapia, Leisa, PA-C  ondansetron  (ZOFRAN -ODT) 4 MG disintegrating tablet Take 1 tablet (4 mg total) by mouth every 8 (eight) hours as needed for refractory nausea / vomiting. Patient not taking: Reported on 01/24/2024 12/18/22   Tapia, Leisa, PA-C  OZEMPIC , 0.25 OR 0.5 MG/DOSE, 2 MG/3ML SOPN Inject 0.5 mg into the skin every 7 (seven) days. 12/15/23   [provider]  polyethylene glycol (MIRALAX  / GLYCOLAX ) 17 g packet Take 17 g by mouth daily as needed for moderate constipation.    [provider]  potassium chloride  SA (KLOR-CON  M) 20 MEQ tablet Take 1 tablet (20 mEq total) by mouth daily as needed (take on days when taking lasix /furosemide ). Patient not taking: Reported on 01/24/2024 04/20/22   Tapia, Leisa, PA-C  predniSONE  (STERAPRED UNI-PAK 21 TAB) 10 MG (21) TBPK tablet Take as directed on package.  (60 mg po on day 1, 50 mg po on day 2...) Patient not  taking: Reported on 01/24/2024 07/18/23   Gareth Mliss FALCON, FNP  ULTICARE SHORT PEN NEEDLES 31G X 8 MM MISC  01/09/19   [provider]

## 2024-02-07 LAB — GLUCOSE, CAPILLARY
Glucose-Capillary: 236 mg/dL — ABNORMAL HIGH (ref 70–99)
Glucose-Capillary: 195 mg/dL — ABNORMAL HIGH (ref 70–99)

## 2024-02-07 NOTE — Plan of Care (Signed)
  Problem: Skin Integrity: Goal: Risk for impaired skin integrity will decrease Outcome: Progressing   Problem: Safety: Goal: Ability to remain free from injury will improve Outcome: Progressing   Problem: Pain Managment: Goal: General experience of comfort will improve and/or be controlled Outcome: Progressing

## 2024-02-07 NOTE — Anesthesia Postprocedure Evaluation (Signed)
 Anesthesia Post Note  Patient: Olivia Romero  Procedure(s) Performed: posterior lumbar interbody arthrodesis lumbar three- lumbar four, hardware removal lumbar five, nonsegmental pedicle fixation lumbar three-lumbar four     Patient location during evaluation: PACU Anesthesia Type: General Level of consciousness: sedated and patient cooperative Pain management: pain level controlled Vital Signs Assessment: post-procedure vital signs reviewed and stable Respiratory status: spontaneous breathing Cardiovascular status: stable Anesthetic complications: no   There were no known notable events for this encounter.  Last Vitals:  Vitals:   02/07/24 0436 02/07/24 0802  BP: 111/64 (!) 118/53  Pulse: 96 99  Resp: 19 16  Temp: 36.7 C 36.7 C  SpO2: 100% 97%    Last Pain:  Vitals:   02/07/24 0924  TempSrc:   PainSc: 9                  Norleen Pope

## 2024-02-07 NOTE — TOC Initial Note (Signed)
 Transition of Care [facility]) - Initial/Assessment Note    Patient Details  Name: Olivia Romero MRN: [id number] Date of Birth: 1955-10-26  [facility]) CM/SW Contact:    Jeoffrey LITTIE Maranda ISRAEL Phone Number: 02/07/2024, 9:32 AM  Clinical Narrative:                 Pt admitted from home for surgery. No current TOC needs, please consult as needs arise following therapy eval.         Patient Goals and CMS Choice            Expected Discharge Plan and Services                                              Prior Living Arrangements/Services                       Activities of Daily Living      Permission Sought/Granted                  Emotional Assessment              Admission diagnosis:  Lumbar adjacent segment disease with spondylolisthesis [M51.369, M43.16] Patient Active Problem List   Diagnosis Date Noted   Lumbar adjacent segment disease with spondylolisthesis 02/06/2024   Aortic atherosclerosis (HCC) 01/15/2023   History of diastolic dysfunction 04/18/2022   Mitral valve insufficiency 04/18/2022   Statin myopathy 11/23/2021   History of colitis 08/03/2021   Vitamin D  deficiency 07/12/2020   Chronic low back pain 07/12/2020   Sepsis without acute organ dysfunction (HCC) 12/29/2019   Cervical disc disorder 12/29/2019   Brachial radiculitis 12/29/2019   Allergy to statin medication 12/01/2019   History of below knee amputation, left (HCC) 07/30/2019   PAD (peripheral artery disease) (HCC) 12/05/2018   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) 09/18/2018   Class 2 severe obesity with serious comorbidity and body mass index (BMI) of 35.0 to 35.9 in adult (HCC) 11/01/2017   B12 deficiency 11/01/2017   Diabetic polyneuropathy associated with type 2 diabetes mellitus (HCC) 12/13/2015   Lumbar stenosis with neurogenic claudication 09/02/2015   Chronic constipation 07/13/2015   OSA (obstructive sleep apnea) 06/25/2015    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/23/2015   Hyperlipidemia 04/23/2015   Statin intolerance 04/23/2015   Asthma, mild intermittent 02/01/2015   Type 2 diabetes mellitus with diabetic neuropathy, with long-term current use of insulin  (HCC) 12/31/2014   Gastroesophageal reflux disease with esophagitis 12/31/2014   Bilateral carpal tunnel syndrome 12/01/2014   Diverticulosis of colon 11/30/2012   Benign essential hypertension 11/28/2012   PCP:  [name], PA-C Pharmacy:   [facility] - [place], [place] - [address] [place] [address] Phone: [phone] Fax: [phone]  [facility] [address] [place] [address] Phone: [phone] Fax: [phone]  [id number] [place], [place] - [address] AT [facility] OF [address] [place] [address] Phone: [phone] Fax: [phone]     Social Drivers of Health (SDOH) Social History: SDOH Screenings   Food Insecurity: No Food Insecurity (02/06/2024)  Housing: Low Risk  (02/06/2024)  Transportation Needs: No Transportation Needs (02/06/2024)  Utilities: Not At Risk (02/06/2024)  Alcohol Screen: Low  Risk  (10/27/2022)  Depression (PHQ2-9): Low Risk  (01/15/2023)  Financial Resource Strain: Low Risk  (10/27/2022)  Physical Activity: Sufficiently Active (10/27/2022)  Social Connections: Socially Integrated (02/06/2024)  Stress: No Stress Concern Present (10/27/2022)  Tobacco Use: Low Risk  (02/06/2024)   SDOH Interventions:     Readmission Risk Interventions     No data to display

## 2024-02-07 NOTE — Evaluation (Signed)
 Physical Therapy Evaluation Patient Details Name: Olivia Romero MRN: [id number] DOB: 1955-08-25 Today's Date: 02/07/2024  History of Present Illness  Pt is a 68 y/o F admitted on 02/06/24 for scheduled posterior lumbar interbody arthrodesis L3-L4, hardware removal L5, nonsegmental pedicle fixation L3-L4. PMH: anemia, asthma, dysphagia, GERD, HLD, HTN, IBS, neuropathy, stroke, DM2, L BKA  Clinical Impression  Pt seen for PT evaluation with pt agreeable, co-tx with OT for pt/therapists' safety. Pt reports prior to admission she was mod I with BLE loftstrand crutches & LLE prosthesis. On this date, pt requires min assist +2 for bed mobility with hospital bed features, unable to tolerate flat HOB 2/2 increased pain at this time. Pt is able to transfer sit>stand with min assist +2 fade to CGA +1. Pt progressed to ambulating in room to recliner with RW & min assist with 2nd person to manage lines. Pt is an excellent candidate for post acute rehab >3 hours therapy/day upon d/c & eager to return to mod I PLOF. Will continue to follow pt acutely to progress mobility as able.        If plan is discharge home, recommend the following: A little help with walking and/or transfers;A little help with bathing/dressing/bathroom;Assistance with cooking/housework;Assist for transportation;Help with stairs or ramp for entrance   Can travel by private vehicle        Equipment Recommendations BSC/3in1  Recommendations for Other Services  Rehab consult    Functional Status Assessment Patient has had a recent decline in their functional status and demonstrates the ability to make significant improvements in function in a reasonable and predictable amount of time.     Precautions / Restrictions Precautions Precautions: Fall;Back Required Braces or Orthoses:  (no brace required) Restrictions Weight Bearing Restrictions Per Provider Order: No      Mobility  Bed Mobility Overal bed mobility: Needs  Assistance Bed Mobility: Rolling, Sidelying to Sit Rolling: Min assist, +2 for physical assistance, Used rails Sidelying to sit: Min assist, +2 for physical assistance, HOB elevated, Used rails       General bed mobility comments: cuing re: log rolling technique    Transfers Overall transfer level: Needs assistance Equipment used: Rolling walker (2 wheels) Transfers: Sit to/from Stand, Bed to chair/wheelchair/BSC Sit to Stand: Min assist, +2 physical assistance, +2 safety/equipment   Step pivot transfers: Min assist, +2 physical assistance, +2 safety/equipment (bed>BSC on R with RW)       General transfer comment: cuing re: technique, to reach back for sitting surface    Ambulation/Gait Ambulation/Gait assistance: Min assist, +2 safety/equipment Gait Distance (Feet): 12 Feet Assistive device: Rolling walker (2 wheels) Gait Pattern/deviations: Decreased step length - right, Decreased step length - left, Decreased stride length Gait velocity: decreased     General Gait Details: reports RLE feels shaky  Stairs            Wheelchair Mobility     Tilt Bed    Modified Rankin (Stroke Patients Only)       Balance Overall balance assessment: Needs assistance Sitting-balance support: Feet supported Sitting balance-Leahy Scale: Fair     Standing balance support: During functional activity, Bilateral upper extremity supported, Reliant on assistive device for balance Standing balance-Leahy Scale: Poor                               Pertinent Vitals/Pain Pain Assessment Pain Assessment: Faces Faces Pain Scale: Hurts whole lot Pain Location: low back,  incision Pain Descriptors / Indicators: Discomfort, Grimacing, Guarding, Burning Pain Intervention(s): Monitored during session, Premedicated before session, Limited activity within patient's tolerance, Utilized relaxation techniques, Repositioned    Home Living Family/patient expects to be discharged  to:: Private residence Living Arrangements: Other relatives;Children Available Help at Discharge: Family;Available PRN/intermittently Type of Home: House Home Access: Ramped entrance       Home Layout: One level Home Equipment: Agricultural consultant (2 wheels);Wheelchair - manual;Other (comment) (loftstrand crutches) Additional Comments: Rollator brakes are broken, bed rails, LLE prosthesis, RLE ankle brace for long distance gait.    Prior Function Prior Level of Function : (P) Independent/Modified Independent;History of Falls (last six months)             Mobility Comments: mod I with loftstrand crutches for house hold distances, w/c for longer community distances, 1 fall in the past 6 months. ADLs Comments: mod I for bathing & dressing, donning prosthesis, dose some cleaning & light meal prep otherwise daughter-in-law does     Extremity/Trunk Assessment   Upper Extremity Assessment Upper Extremity Assessment: Right hand dominant;Defer to OT evaluation (endorses BUE peripheral neuropathy)    Lower Extremity Assessment Lower Extremity Assessment: RLE deficits/detail;LLE deficits/detail RLE Deficits / Details: wears brace 2/2 ankle instability, reports similar to charcot foot LLE Deficits / Details: LLE prosthesis    Cervical / Trunk Assessment Cervical / Trunk Assessment: Back Surgery  Communication   Communication Communication: No apparent difficulties    Cognition Arousal: Alert Behavior During Therapy: Anxious, WFL for tasks assessed/performed   PT - Cognitive impairments: No apparent impairments                         Following commands: Intact       Cueing Cueing Techniques: Verbal cues     General Comments General comments (skin integrity, edema, etc.): continent void on BSC, requires assistance for donning LLE prosthesis & RLE shoe (declines wearing R ankle brace for short distance gait) as pt unable while maintaining back precautions    Exercises      Assessment/Plan    PT Assessment Patient needs continued PT services  PT Problem List Decreased strength;Pain;Decreased balance;Decreased activity tolerance;Decreased knowledge of use of DME;Decreased safety awareness;Decreased knowledge of precautions;Decreased mobility       PT Treatment Interventions DME instruction;Balance training;Modalities;Gait training;Neuromuscular re-education;Functional mobility training;Therapeutic activities;Therapeutic exercise;Patient/family education;Wheelchair mobility training    PT Goals (Current goals can be found in the Care Plan section)  Acute Rehab PT Goals Patient Stated Goal: get better, go to CIR PT Goal Formulation: With patient Time For Goal Achievement: 02/21/24 Potential to Achieve Goals: Good    Frequency Min 3X/week     Co-evaluation PT/OT/SLP Co-Evaluation/Treatment: Yes Reason for Co-Treatment: Complexity of the patient's impairments (multi-system involvement);For patient/therapist safety;To address functional/ADL transfers PT goals addressed during session: Mobility/safety with mobility;Balance;Proper use of DME         AM-PAC PT 6 Clicks Mobility  Outcome Measure Help needed turning from your back to your side while in a flat bed without using bedrails?: A Little Help needed moving from lying on your back to sitting on the side of a flat bed without using bedrails?: A Lot Help needed moving to and from a bed to a chair (including a wheelchair)?: A Little Help needed standing up from a chair using your arms (e.g., wheelchair or bedside chair)?: A Little Help needed to walk in hospital room?: Total Help needed climbing 3-5 steps with a railing? :  Total 6 Click Score: 13    End of Session Equipment Utilized During Treatment: Gait belt Activity Tolerance: Patient tolerated treatment well Patient left: in chair;with chair alarm set;with call bell/phone within reach Nurse Communication: Mobility status PT Visit  Diagnosis: Pain;Muscle weakness (generalized) (M62.81);Other abnormalities of gait and mobility (R26.89);Difficulty in walking, not elsewhere classified (R26.2);Unsteadiness on feet (R26.81) Pain - part of body:  (back)    Time: 1007-1050 PT Time Calculation (min) (ACUTE ONLY): 43 min   Charges:   PT Evaluation $PT Eval Moderate Complexity: 1 Mod   PT General Charges $$ ACUTE PT VISIT: 1 Visit         Richerd Pinal, PT, DPT 02/07/24, 11:05 AM   Richerd CHRISTELLA Pinal 02/07/2024, 11:03 AM

## 2024-02-07 NOTE — Progress Notes (Signed)
 Patient ID: Olivia Romero, female   DOB: 1956-05-17, 68 y.o.   MRN: [id number] BP (!) 107/57 (BP Location: Left Arm)   Pulse 100   Temp 98.2 F (36.8 C) (Oral)   Resp 17   Ht 4' 11 (1.499 m)   Wt 82.6 kg   LMP  (LMP Unknown)   SpO2 99%   BMI 36.76 kg/m  Alert and oriented x 4, speech is clear and fluent Moving lower extremities Pt recommended rehab will consult Needs inpatient

## 2024-02-07 NOTE — Progress Notes (Signed)
 Inpatient Rehab Admissions Coordinator Note:  Per therapy recommendations patient was screened for CIR candidacy by Reche FORBES Lowers, PT. Noted pt is observation status at this time. Pt may not have the medical necessity to warrant an inpatient rehab stay if they remain observation. If pt were to qualify for inpatient status, AC will screen for candidacy.    Korin Hartwell E Baxter Gonzalez, [place] [phone] 02/07/24 3:40 PM

## 2024-02-07 NOTE — Evaluation (Signed)
 Occupational Therapy Evaluation Patient Details Name: Olivia Romero MRN: [id number] DOB: 1955/07/15 Today's Date: 02/07/2024   History of Present Illness   Pt is a 68 y/o F admitted on 02/06/24 for scheduled posterior lumbar interbody arthrodesis L3-L4, hardware removal L5, nonsegmental pedicle fixation L3-L4. PMH: anemia, asthma, dysphagia, GERD, HLD, HTN, IBS, neuropathy, stroke, DM2, L BKA     Clinical Impressions Pt seen for OT eval with co-tx with PT for pt/therapists' safety. At baseline, pt is Ind to Mod I with ADLs, medication management, and light meal prep and performs functional mobility Mod I with B loftstrand crutches and L LE prosthesis. Pt now presents with decreased activity tolerance, pain affecting functional level, decreased balance, decreased knowledge of precautions, and decreased safety and independence with functional tasks. Pt currently demonstrating ability to complete ADLs with largely Set up to Max assist, bed mobility with Min assist +2, and functional transfers/mobility with a RW with L LE prosthesis donned with up to Min assist +2. Pt participated well in session, is motivated to return to PLOF, and has supportive family. Pt will benefit from acute skilled OT services to address deficits and increase safety and independence with functional tasks. Post acute discharge, pt will benefit from intensive inpatient skilled rehab services > 3 hours per day to maximize rehab potential.      If plan is discharge home, recommend the following:   Two people to help with walking and/or transfers;A lot of help with bathing/dressing/bathroom;Assistance with cooking/housework;Assist for transportation;Help with stairs or ramp for entrance     Functional Status Assessment   Patient has had a recent decline in their functional status and demonstrates the ability to make significant improvements in function in a reasonable and predictable amount of time.     Equipment  Recommendations   None recommended by OT (Pt already has needed equipment)     Recommendations for Other Services   Rehab consult     Precautions/Restrictions   Precautions Precautions: Fall;Back Recall of Precautions/Restrictions: Intact Precaution/Restrictions Comments: verbally reviewed back precautions during functional tasks Required Braces or Orthoses:  (no brace required) Restrictions Weight Bearing Restrictions Per Provider Order: No Other Position/Activity Restrictions: back precautions     Mobility Bed Mobility Overal bed mobility: Needs Assistance Bed Mobility: Rolling, Sidelying to Sit Rolling: Min assist, +2 for physical assistance, Used rails Sidelying to sit: Min assist, +2 for physical assistance, HOB elevated, Used rails       General bed mobility comments: cuing re: log rolling technique    Transfers Overall transfer level: Needs assistance Equipment used: Rolling walker (2 wheels) Transfers: Sit to/from Stand, Bed to chair/wheelchair/BSC Sit to Stand: Min assist, +2 physical assistance, +2 safety/equipment     Step pivot transfers: Min assist, +2 physical assistance, +2 safety/equipment (bed>BSC on R with RW)     General transfer comment: cuing re: technique, to reach back for sitting surface      Balance Overall balance assessment: Needs assistance Sitting-balance support: Feet supported, Single extremity supported, No upper extremity supported Sitting balance-Leahy Scale: Fair     Standing balance support: Single extremity supported, Bilateral upper extremity supported, During functional activity, Reliant on assistive device for balance                               ADL either performed or assessed with clinical judgement   ADL Overall ADL's : Needs assistance/impaired Eating/Feeding: Set up;Sitting   Grooming: Set up;Sitting  Upper Body Bathing: Minimal assistance;Sitting (adhering to back precautions)   Lower Body  Bathing: Maximal assistance;Sitting/lateral leans;Sit to/from stand;Cueing for compensatory techniques (adhering to back precautions)   Upper Body Dressing : Contact guard assist;Sitting;Cueing for compensatory techniques (adhering to back precautions)   Lower Body Dressing: Maximal assistance;Sitting/lateral leans;Sit to/from stand;Cueing for compensatory techniques (adhering to back precautions)   Toilet Transfer: Minimal assistance;+2 for physical assistance;+2 for safety/equipment;Rolling walker (2 wheels);BSC/3in1;Standard walker (step-pivot; with L LE prosthesis donned)   Toileting- Clothing Manipulation and Hygiene: Maximal assistance;Sit to/from stand;Cueing for compensatory techniques (adhering to back precautions)       Functional mobility during ADLs: Minimal assistance;+2 for physical assistance;+2 for safety/equipment;Rolling walker (2 wheels) (with L LE prosthesis donned) General ADL Comments: Pt requiring Max assist for donning LLE prosthesis & RLE shoe (declines wearing R ankle brace for short distance gait) this session as pt unable to donn them while maintaining back precautions. Pt with decreased activity tolerance, fatiguing quickly during tasks.     Vision Baseline Vision/History: 1 Wears glasses Ability to See in Adequate Light: 0 Adequate (with glasses) Patient Visual Report: No change from baseline Vision Assessment?: No apparent visual deficits (with glasses) Additional Comments: Vision [facility] for tasks assessed; not formally screened     Perception         Praxis         Pertinent Vitals/Pain Pain Assessment Pain Assessment: Faces Pain Score: 5  (at rest in bed) Faces Pain Scale: Hurts whole lot Pain Location: back Pain Descriptors / Indicators: Discomfort, Grimacing, Guarding, Burning Pain Intervention(s): Limited activity within patient's tolerance, Monitored during session, Premedicated before session, Repositioned     Extremity/Trunk Assessment  Upper Extremity Assessment Upper Extremity Assessment: Right hand dominant;RUE deficits/detail;LUE deficits/detail RUE Deficits / Details: gross strength 4/5; hx of peripheral neuropathy; AROM and coordination WFL RUE Sensation: history of peripheral neuropathy RUE Coordination: WNL LUE Deficits / Details: gross strength 4- to 4/5; hx of peripheral neuropathy; hx of shoulder fx with mildly decreased internal/external shoulder rotation at baseline but with AROM largely [facility] and all other AROM WFL; coordination WFL LUE Sensation: history of peripheral neuropathy   Lower Extremity Assessment Lower Extremity Assessment: Defer to PT evaluation RLE Deficits / Details: wears brace 2/2 ankle instability, reports similar to charcot foot LLE Deficits / Details: LLE prosthesis   Cervical / Trunk Assessment Cervical / Trunk Assessment: Back Surgery   Communication Communication Communication: No apparent difficulties   Cognition Arousal: Alert Behavior During Therapy: WFL for tasks assessed/performed, Anxious Cognition: No apparent impairments             OT - Cognition Comments: AAOx4 and pleasant throughout session. Initially anxious regarding mobility and back pain but progressing well with noted decrease in anxiety throughout session.                 Following commands: Intact       Cueing  General Comments   Cueing Techniques: Verbal cues  continent void on [facility]   Exercises     Shoulder Instructions      Home Living Family/patient expects to be discharged to:: Private residence Living Arrangements: Other relatives;Children (son, daughter-in-law, grandson) Available Help at Discharge: Family;Available PRN/intermittently (son and DIL work) Type of Home: House Home Access: Ramped entrance     Home Layout: One level     Bathroom Shower/Tub: Chief Strategy Officer: Standard Bathroom Accessibility: No   Home Equipment: Tub bench;Hand held shower  head;Other (comment);Scientist, research (medical) (4  wheels);Rolling Walker (2 wheels) (loftstrand crutches; L LE prosthesis; R ankle brace for long distance gait; bed rail)   Additional Comments: Pt reports Rollator brakes are broken      Prior Functioning/Environment Prior Level of Function : Independent/Modified Independent;History of Falls (last six months)             Mobility Comments: mod I with loftstrand crutches for house hold distances, w/c for longer community distances; had 1 fall in the past 6 months, foot caught on cabinet when getting dog a treat ADLs Comments: Ind to Mod I with ADLs, donning/doffing L LE prosthesis, medication management, and light meal prep; family assists with other IADLs; has transportation service through insurance and family also assists with transportation    OT Problem List: Decreased activity tolerance;Impaired balance (sitting and/or standing);Decreased knowledge of precautions;Pain   OT Treatment/Interventions: Self-care/ADL training;Energy conservation;DME and/or AE instruction;Therapeutic activities;Therapeutic exercise;Patient/family education;Balance training      OT Goals(Current goals can be found in the care plan section)   Acute Rehab OT Goals Patient Stated Goal: to continue with rehab and then return home OT Goal Formulation: With patient Time For Goal Achievement: 02/21/24 Potential to Achieve Goals: Good ADL Goals Pt Will Perform Grooming: standing;with supervision (adhering to back precautions) Pt Will Perform Lower Body Bathing: with min assist;with adaptive equipment;sitting/lateral leans;sit to/from stand (adhering to back precautions) Pt Will Perform Lower Body Dressing: with min assist;with adaptive equipment;sitting/lateral leans;sit to/from stand (adhering to back precautions) Pt Will Transfer to Toilet: with supervision;ambulating;regular height toilet;grab bars (adhering to back precautions; with least restrictive  AD) Pt Will Perform Toileting - Clothing Manipulation and hygiene: with contact guard assist;sitting/lateral leans;sit to/from stand (adhering to back precautions; with adaptive equipment as needed)   OT Frequency:  Min 2X/week    Co-evaluation PT/OT/SLP Co-Evaluation/Treatment: Yes Reason for Co-Treatment: Complexity of the patient's impairments (multi-system involvement);For patient/therapist safety;To address functional/ADL transfers PT goals addressed during session: Mobility/safety with mobility;Balance;Proper use of DME OT goals addressed during session: ADL's and self-care      AM-PAC OT 6 Clicks Daily Activity     Outcome Measure Help from another person eating meals?: A Little Help from another person taking care of personal grooming?: A Little Help from another person toileting, which includes using toliet, bedpan, or urinal?: A Lot Help from another person bathing (including washing, rinsing, drying)?: A Lot Help from another person to put on and taking off regular upper body clothing?: A Little Help from another person to put on and taking off regular lower body clothing?: A Lot 6 Click Score: 15   End of Session Equipment Utilized During Treatment: Rolling walker (2 wheels);Gait belt;Other (comment) (BSC; pt's L LE prosthesis) Nurse Communication: Mobility status;Other (comment) (Pt with continent void during session.)  Activity Tolerance: Patient tolerated treatment well Patient left: in chair;with call bell/phone within reach;with chair alarm set  OT Visit Diagnosis: Unsteadiness on feet (R26.81);Other abnormalities of gait and mobility (R26.89);Pain;Other (comment) (decreased activity tolerance)                Time: 1007-1050 OT Time Calculation (min): 43 min Charges:  OT General Charges $OT Visit: 1 Visit OT Evaluation $OT Eval Moderate Complexity: 1 Mod OT Treatments $Self Care/Home Management : 8-22 mins  Olivia Rockey HERO., OTR/L, MA Acute  Rehab [phone]   Olivia Romero 02/07/2024, 1:03 PM

## 2024-02-08 DIAGNOSIS — E1142 Type 2 diabetes mellitus with diabetic polyneuropathy: Secondary | ICD-10-CM | POA: Diagnosis not present

## 2024-02-08 DIAGNOSIS — I10 Essential (primary) hypertension: Secondary | ICD-10-CM | POA: Diagnosis not present

## 2024-02-08 DIAGNOSIS — M48062 Spinal stenosis, lumbar region with neurogenic claudication: Secondary | ICD-10-CM | POA: Diagnosis not present

## 2024-02-08 DIAGNOSIS — Z7409 Other reduced mobility: Secondary | ICD-10-CM | POA: Diagnosis not present

## 2024-02-08 DIAGNOSIS — Z794 Long term (current) use of insulin: Secondary | ICD-10-CM | POA: Diagnosis not present

## 2024-02-08 DIAGNOSIS — M1711 Unilateral primary osteoarthritis, right knee: Secondary | ICD-10-CM | POA: Diagnosis not present

## 2024-02-08 DIAGNOSIS — I340 Nonrheumatic mitral (valve) insufficiency: Secondary | ICD-10-CM | POA: Diagnosis not present

## 2024-02-08 DIAGNOSIS — Z7401 Bed confinement status: Secondary | ICD-10-CM | POA: Diagnosis not present

## 2024-02-08 DIAGNOSIS — Z741 Need for assistance with personal care: Secondary | ICD-10-CM | POA: Diagnosis not present

## 2024-02-08 DIAGNOSIS — G4733 Obstructive sleep apnea (adult) (pediatric): Secondary | ICD-10-CM | POA: Diagnosis not present

## 2024-02-08 DIAGNOSIS — K219 Gastro-esophageal reflux disease without esophagitis: Secondary | ICD-10-CM | POA: Diagnosis not present

## 2024-02-08 DIAGNOSIS — Z8249 Family history of ischemic heart disease and other diseases of the circulatory system: Secondary | ICD-10-CM | POA: Diagnosis not present

## 2024-02-08 DIAGNOSIS — M6281 Muscle weakness (generalized): Secondary | ICD-10-CM | POA: Diagnosis not present

## 2024-02-08 DIAGNOSIS — M51369 Other intervertebral disc degeneration, lumbar region without mention of lumbar back pain or lower extremity pain: Secondary | ICD-10-CM | POA: Diagnosis not present

## 2024-02-08 DIAGNOSIS — E1151 Type 2 diabetes mellitus with diabetic peripheral angiopathy without gangrene: Secondary | ICD-10-CM | POA: Diagnosis not present

## 2024-02-08 DIAGNOSIS — Z7982 Long term (current) use of aspirin: Secondary | ICD-10-CM | POA: Diagnosis not present

## 2024-02-08 DIAGNOSIS — Z981 Arthrodesis status: Secondary | ICD-10-CM | POA: Diagnosis not present

## 2024-02-08 DIAGNOSIS — K592 Neurogenic bowel, not elsewhere classified: Secondary | ICD-10-CM | POA: Diagnosis not present

## 2024-02-08 DIAGNOSIS — I700 Atherosclerosis of aorta: Secondary | ICD-10-CM | POA: Diagnosis not present

## 2024-02-08 DIAGNOSIS — I959 Hypotension, unspecified: Secondary | ICD-10-CM | POA: Diagnosis not present

## 2024-02-08 DIAGNOSIS — E669 Obesity, unspecified: Secondary | ICD-10-CM | POA: Diagnosis not present

## 2024-02-08 DIAGNOSIS — M14671 Charcot's joint, right ankle and foot: Secondary | ICD-10-CM | POA: Diagnosis not present

## 2024-02-08 DIAGNOSIS — R41841 Cognitive communication deficit: Secondary | ICD-10-CM | POA: Diagnosis not present

## 2024-02-08 DIAGNOSIS — R2681 Unsteadiness on feet: Secondary | ICD-10-CM | POA: Diagnosis not present

## 2024-02-08 DIAGNOSIS — Z91040 Latex allergy status: Secondary | ICD-10-CM | POA: Diagnosis not present

## 2024-02-08 DIAGNOSIS — Z8673 Personal history of transient ischemic attack (TIA), and cerebral infarction without residual deficits: Secondary | ICD-10-CM | POA: Diagnosis not present

## 2024-02-08 DIAGNOSIS — M1611 Unilateral primary osteoarthritis, right hip: Secondary | ICD-10-CM | POA: Diagnosis not present

## 2024-02-08 DIAGNOSIS — M4316 Spondylolisthesis, lumbar region: Secondary | ICD-10-CM | POA: Diagnosis not present

## 2024-02-08 DIAGNOSIS — R2689 Other abnormalities of gait and mobility: Secondary | ICD-10-CM | POA: Diagnosis not present

## 2024-02-08 DIAGNOSIS — Z91012 Allergy to eggs: Secondary | ICD-10-CM | POA: Diagnosis not present

## 2024-02-08 DIAGNOSIS — G5603 Carpal tunnel syndrome, bilateral upper limbs: Secondary | ICD-10-CM | POA: Diagnosis not present

## 2024-02-08 DIAGNOSIS — J4520 Mild intermittent asthma, uncomplicated: Secondary | ICD-10-CM | POA: Diagnosis not present

## 2024-02-08 DIAGNOSIS — M549 Dorsalgia, unspecified: Secondary | ICD-10-CM | POA: Diagnosis not present

## 2024-02-08 DIAGNOSIS — E785 Hyperlipidemia, unspecified: Secondary | ICD-10-CM | POA: Diagnosis not present

## 2024-02-08 DIAGNOSIS — M5090 Cervical disc disorder, unspecified, unspecified cervical region: Secondary | ICD-10-CM | POA: Diagnosis not present

## 2024-02-08 DIAGNOSIS — K2100 Gastro-esophageal reflux disease with esophagitis, without bleeding: Secondary | ICD-10-CM | POA: Diagnosis not present

## 2024-02-08 DIAGNOSIS — E538 Deficiency of other specified B group vitamins: Secondary | ICD-10-CM | POA: Diagnosis not present

## 2024-02-08 DIAGNOSIS — Z887 Allergy status to serum and vaccine status: Secondary | ICD-10-CM | POA: Diagnosis not present

## 2024-02-08 DIAGNOSIS — M25551 Pain in right hip: Secondary | ICD-10-CM | POA: Diagnosis not present

## 2024-02-08 DIAGNOSIS — K3184 Gastroparesis: Secondary | ICD-10-CM | POA: Diagnosis not present

## 2024-02-08 DIAGNOSIS — I739 Peripheral vascular disease, unspecified: Secondary | ICD-10-CM | POA: Diagnosis not present

## 2024-02-08 DIAGNOSIS — Z89512 Acquired absence of left leg below knee: Secondary | ICD-10-CM | POA: Diagnosis not present

## 2024-02-08 DIAGNOSIS — Z888 Allergy status to other drugs, medicaments and biological substances status: Secondary | ICD-10-CM | POA: Diagnosis not present

## 2024-02-08 DIAGNOSIS — Z91048 Other nonmedicinal substance allergy status: Secondary | ICD-10-CM | POA: Diagnosis not present

## 2024-02-08 DIAGNOSIS — E1143 Type 2 diabetes mellitus with diabetic autonomic (poly)neuropathy: Secondary | ICD-10-CM | POA: Diagnosis not present

## 2024-02-08 DIAGNOSIS — Z7984 Long term (current) use of oral hypoglycemic drugs: Secondary | ICD-10-CM | POA: Diagnosis not present

## 2024-02-08 DIAGNOSIS — Z751 Person awaiting admission to adequate facility elsewhere: Secondary | ICD-10-CM | POA: Diagnosis not present

## 2024-02-08 DIAGNOSIS — Z823 Family history of stroke: Secondary | ICD-10-CM | POA: Diagnosis not present

## 2024-02-08 DIAGNOSIS — Z6836 Body mass index (BMI) 36.0-36.9, adult: Secondary | ICD-10-CM | POA: Diagnosis not present

## 2024-02-08 LAB — GLUCOSE, CAPILLARY
Glucose-Capillary: 255 mg/dL — ABNORMAL HIGH (ref 70–99)
Glucose-Capillary: 246 mg/dL — ABNORMAL HIGH (ref 70–99)

## 2024-02-08 NOTE — Plan of Care (Signed)

## 2024-02-08 NOTE — Progress Notes (Signed)
 Mobility Specialist Progress Note:    02/08/24 1504  Mobility  Activity Ambulated with assistance (In room)  Level of Assistance Contact guard assist, steadying assist  Assistive Device Front wheel walker  Distance Ambulated (ft) 30 ft  Activity Response Tolerated well  Mobility Referral Yes  Mobility visit 1 Mobility  Mobility Specialist Start Time (ACUTE ONLY) 1355  Mobility Specialist Stop Time (ACUTE ONLY) 1410  Mobility Specialist Time Calculation (min) (ACUTE ONLY) 15 min   Received pt on [facility] w/ RN and agreeable to mobility. No physical assistance needed. Pt ambulated to room door and returned to bed. C/o back pain, otherwise tolerated well. Pt left in bed with alarm on. Personal belongings and call light within reach. All needs met.  Olivia Romero Mobility Specialist  Please contact via [employer] or  Rehab Office [phone]

## 2024-02-08 NOTE — Plan of Care (Signed)
  Problem: Clinical Measurements: Goal: Ability to maintain clinical measurements within normal limits will improve Outcome: Progressing   Problem: Activity: Goal: Risk for activity intolerance will decrease Outcome: Progressing   Problem: Nutrition: Goal: Adequate nutrition will be maintained Outcome: Progressing   Problem: Elimination: Goal: Will not experience complications related to urinary retention Outcome: Progressing   Problem: Pain Managment: Goal: General experience of comfort will improve and/or be controlled Outcome: Progressing

## 2024-02-08 NOTE — Progress Notes (Signed)
 Inpatient Rehab Admissions Coordinator:   Note pt switched to inpatient status.  I will place a consult per our protocol for rehab assessment.   Reche Lowers, PT, DPT Admissions Coordinator [phone] 02/08/24  2:45 PM

## 2024-02-08 NOTE — Progress Notes (Signed)
 Patient ID: Olivia Romero, female   DOB: 11-May-1956, 68 y.o.   MRN: [id number] BP 130/65 (BP Location: Left Arm)   Pulse 87   Temp 98.1 F (36.7 C) (Oral)   Resp 16   Ht 4' 11 (1.499 m)   Wt 82.6 kg   LMP  (LMP Unknown)   SpO2 97%   BMI 36.76 kg/m  Alert and oriented x4 Speech is clear, and fluent Moving all extremities well Wound with sanguineous drainage, but clean.  Continue with pt, ot

## 2024-02-08 NOTE — Progress Notes (Addendum)
 Physical Therapy Treatment Patient Details Name: Olivia Romero MRN: [id number] DOB: [birth year] Today's Date: 02/08/2024   History of Present Illness Pt is a 68 y/o F admitted on 02/06/24 for scheduled posterior lumbar interbody arthrodesis L3-L4, hardware removal L5, nonsegmental pedicle fixation L3-L4. PMH: anemia, asthma, dysphagia, GERD, HLD, HTN, IBS, neuropathy, stroke, DM2, L BKA    PT Comments  Pt seen for PT tx with pt reporting she's already been OOB to recliner, to [facility] this AM but agreeable to further mobility. Pt exits R side of bed with cuing re: log rolling, use of bed rails, holding to PT's hand to come to sitting upright on EOB. Pt ambulates in room with RW & CGA<>min assist with decreased gait speed, gait pattern as noted below. Assisted pt with achieving comfortable position in recliner & reviewed back precautions, pt able to recall 2/3 at most. Reviewed use of incentive spirometer with pt; pt demonstrating good use of device. Pt remains an excellent candidate for post acute rehab >3 hours therapy/day. Will continue to follow pt acutely & progress mobility as able.    If plan is discharge home, recommend the following: A little help with walking and/or transfers;A little help with bathing/dressing/bathroom;Assistance with cooking/housework;Assist for transportation;Help with stairs or ramp for entrance   Can travel by private vehicle        Equipment Recommendations  BSC/3in1    Recommendations for Other Services Rehab consult     Precautions / Restrictions Precautions Precautions: Fall;Back Restrictions Weight Bearing Restrictions Per Provider Order: No     Mobility  Bed Mobility Overal bed mobility: Needs Assistance Bed Mobility: Rolling, Sidelying to Sit Rolling: Supervision, Used rails Sidelying to sit: Min assist, HOB elevated, Used rails       General bed mobility comments: cuing for LE positioning for log rolling, holds to PT's hand to upright trunk,  extra time, exits R side of bed with more success compared to L side yesterday    Transfers Overall transfer level: Needs assistance Equipment used: Rolling walker (2 wheels) Transfers: Sit to/from Stand Sit to Stand: Contact guard assist           General transfer comment: sit>stand from EOB with cuing re: hand placement    Ambulation/Gait Ambulation/Gait assistance: Contact guard assist, Min assist Gait Distance (Feet): 30 Feet Assistive device: Rolling walker (2 wheels) Gait Pattern/deviations: Decreased step length - right, Decreased step length - left, Decreased stride length Gait velocity: decreased     General Gait Details: Pt ambulates around bed to door & back with RW & CGA<>min assist. Pt maintains slight L knee flexion throughout gait but reports this is baseline. Pt requires cuing to stay within base of AD especially when turning around & education re: hand placement on RW.   Stairs             Wheelchair Mobility     Tilt Bed    Modified Rankin (Stroke Patients Only)       Balance Overall balance assessment: Needs assistance Sitting-balance support: Feet supported, Single extremity supported, No upper extremity supported Sitting balance-Leahy Scale: Fair     Standing balance support: Bilateral upper extremity supported, During functional activity, Reliant on assistive device for balance Standing balance-Leahy Scale: Fair                              Musician Communication: No apparent difficulties  Cognition Arousal: Alert Behavior During Therapy: [facility] for  tasks assessed/performed, Anxious   PT - Cognitive impairments: No apparent impairments                         Following commands: Intact      Cueing Cueing Techniques: Verbal cues  Exercises Other Exercises Other Exercises: Reviewed back precautions, pt able to recall 1/3 at beginning of session, 2/3 at end of session.    General Comments  General comments (skin integrity, edema, etc.): PT assisted pt with donning R shoe, LLE liner & prosthesis.      Pertinent Vitals/Pain Pain Assessment Pain Assessment: 0-10 Pain Score: 7  (at beginning of session, 5/10 at end) Pain Location: back Pain Descriptors / Indicators: Discomfort, Pounding, Grimacing, Guarding Pain Intervention(s): Monitored during session, Repositioned, Limited activity within patient's tolerance, Premedicated before session    Home Living                          Prior Function            PT Goals (current goals can now be found in the care plan section) Acute Rehab PT Goals Patient Stated Goal: get better, go to CIR PT Goal Formulation: With patient Time For Goal Achievement: 02/21/24 Potential to Achieve Goals: Good Progress towards PT goals: Progressing toward goals    Frequency    Min 5X/week      PT Plan      Co-evaluation              AM-PAC PT 6 Clicks Mobility   Outcome Measure  Help needed turning from your back to your side while in a flat bed without using bedrails?: A Little Help needed moving from lying on your back to sitting on the side of a flat bed without using bedrails?: A Lot Help needed moving to and from a bed to a chair (including a wheelchair)?: A Little Help needed standing up from a chair using your arms (e.g., wheelchair or bedside chair)?: A Little Help needed to walk in hospital room?: A Little Help needed climbing 3-5 steps with a railing? : Total 6 Click Score: 15    End of Session   Activity Tolerance: Patient tolerated treatment well;Patient limited by fatigue;Patient limited by pain Patient left: in chair;with chair alarm set;with call bell/phone within reach Nurse Communication: Mobility status PT Visit Diagnosis: Pain;Muscle weakness (generalized) (M62.81);Other abnormalities of gait and mobility (R26.89);Difficulty in walking, not elsewhere classified (R26.2);Unsteadiness on feet  (R26.81) Pain - part of body:  (back)     Time: 8896-8872 PT Time Calculation (min) (ACUTE ONLY): 24 min  Charges:    $Therapeutic Activity: 23-37 mins PT General Charges $$ ACUTE PT VISIT: 1 Visit                     Olivia Romero, PT, DPT 02/08/24, 11:34 AM   Olivia Romero 02/08/2024, 11:33 AM

## 2024-02-09 LAB — GLUCOSE, CAPILLARY
Glucose-Capillary: 225 mg/dL — ABNORMAL HIGH (ref 70–99)
Glucose-Capillary: 149 mg/dL — ABNORMAL HIGH (ref 70–99)
Glucose-Capillary: 204 mg/dL — ABNORMAL HIGH (ref 70–99)

## 2024-02-09 NOTE — Progress Notes (Signed)
 Mobility Specialist Progress Note:    02/09/24 1018  Mobility  Activity Ambulated with assistance;Pivoted/transferred to/from [facility];Pivoted/transferred from bed to chair  Level of Assistance Contact guard assist, steadying assist  Assistive Device Front wheel walker  Distance Ambulated (ft) 30 ft  Activity Response Tolerated fair  Mobility Referral Yes  Mobility visit 1 Mobility  Mobility Specialist Start Time (ACUTE ONLY) 1018  Mobility Specialist Stop Time (ACUTE ONLY) 1056  Mobility Specialist Time Calculation (min) (ACUTE ONLY) 38 min   Pt pleasant and agreeable to session. Some light pain but nothing too bad. Pt first transferred to [facility]. Noticed some leaking from bandage strip on lower back. Notified RN. Pt then ambulated in room after donning right ankle brace to walk. Pt need to use BSC one more time before ending in recliner. Left pt in room w/ all needs met.   Venetia Keel Mobility Specialist Please Neurosurgeon or Rehab Office at [phone]

## 2024-02-09 NOTE — Progress Notes (Signed)
 Inpatient Rehab Admissions:  Inpatient Rehab Consult received.  I met with patient and friends including Vickie at the bedside for rehabilitation assessment and to discuss goals and expectations of an inpatient rehab admission.  Dicussed average length of stay, insurance authorization requirement and discharge home after completion of CIR. Both pt and Vickie acknowledged understanding. Pt is interested in pursuing CIR and Vickie is supportive. Pt and Vickie confirmed that friends and family will be able to provide 24/7 support for pt after discharge. Will continue to follow.  Signed: Tinnie Yvone Cohens, MS, CCC-SLP Admissions Coordinator [phone]

## 2024-02-09 NOTE — Plan of Care (Signed)

## 2024-02-09 NOTE — Progress Notes (Signed)
 Patient ID: Olivia Romero, female   DOB: Dec 04, 1955, 68 y.o.   MRN: [id number] BP 135/64 (BP Location: Left Arm)   Pulse (!) 102   Temp 98 F (36.7 C) (Oral)   Resp 19   Ht 4' 11 (1.499 m)   Wt 82.6 kg   LMP  (LMP Unknown)   SpO2 97%   BMI 36.76 kg/m  Alert and oriented x 4, speech is clear and fluent Perrl, full eom Tongue and uvula midline Moving all extremities well Dressing changed twice since I did it. New dressing is currently dry.  Improving with time

## 2024-02-09 NOTE — PMR Pre-admission (Shared)
 PMR Admission Coordinator Pre-Admission Assessment  Patient: Olivia Romero is an 68 y.o., female MRN: [id number] DOB: [birth year] Height: 4' 11 (149.9 cm) Weight: 82.6 kg              Insurance Information HMO:     PPO: yes     PCP:      IPA:      80/20:      OTHER:  PRIMARY: [employer]      Policy#: [id number]      Subscriber: patient CM Name: ***      Phone#: ***     Fax#: [phone] Pre-Cert#: ***      Employer:  Benefits:  Phone #: [phone]     Name:  Eff. Date: 06/06/23-06/04/24     Deduct: does not have one      Out of Pocket Max: $3,500 ($1,006.22 met)      Life Max: NA  CIR: $225/day co-pay for days 1-6, 100% coverage for days 7-90      SNF: 100% coverage for days 1-0, $203/day co-pay for days 21-100 Outpatient: $15/visit co-pay     Co-Pay:  Home Health: 80% coverage      Co-Pay: 20% co-insurance DME: 80% coverage     Co-Pay: 20% co-insurance Providers: in-network SECONDARY:       Policy#:       Phone#:   Artist:       Phone#:   The Data processing manager" for patients in Inpatient Rehabilitation Facilities with attached "Privacy Act Statement-Health Care Records" was provided and verbally reviewed with: Patient  Emergency Contact Information Contact Information     Name Relation Home Work Mobile   [place] Relative [phone]  [phone]   MACKY MILDERD Benne   [phone]      Other Contacts     Name Relation Home Work Mobile   [place] Son [phone]  [phone]   Patriciann, Becht   [phone]   Lennie Boby Benne   [phone]   milon milderd Benne   [phone]      Current Medical History  Patient Admitting Diagnosis: 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 Pt presented to [facility] on 02/06[facility]24 for  planned adjacent segment decompression and arthrodesis at L3-4 by  Dr. Gillie. Therapy evaluations completed and CIR recommended d/t pt's deficits in functional mobility.    Glasgow Coma Scale Score: 15  Patient's medical record from [facility] has been reviewed by the rehabilitation admission coordinator and physician.  Past Medical History  Past Medical History:  Diagnosis Date   Anemia    Arthritis    Asthma    Colon polyps    adenomatous   Diabetes mellitus without complication (HCC)    Diabetic infection of left foot (HCC) 12/2018   Diverticulosis of colon    Dysphagia 12/31/2014   Esophagitis    GERD (gastroesophageal reflux disease)    Hemorrhoid    internal   Hyperlipemia    Hypertension    IBS (irritable bowel syndrome)    no current prob - diet controlled   MRSA bacteremia 12/05/2018   Myalgia due to statin 11/19/2017   Neuropathy    Neuropathy of both feet    Neuropathy of hand    Pneumonia    x 4   PONV (postoperative nausea and vomiting)    on some  surgeries but not all procedures   Skin ulcer of right ankle, limited to breakdown of skin (HCC) 12/31/2016   resolved per patient 05/14/19   Sleep apnea    has had in the past lost 50 pounds and do longer uses cpap   Statin intolerance 04/23/2015   Stroke (HCC) 2009   mini stroke per patient   Stroke-like episode 2009   TIA - mini stroke per patient   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 07/13/2015   Uncontrolled type 2 diabetes mellitus with hyperglycemia (HCC) 11/01/2017    Has the patient had major surgery during 100 days prior to admission? Yes  Family History  family history includes Arthritis in her brother and father; Hypertension in her father and mother; Kidney cancer in her mother; Lung cancer in her father; Other in her mother; Rheum arthritis in her maternal uncle; Stroke in her mother.   Current Medications   Current Facility-Administered Medications:    0.9 %  NaCl with KCl 20 mEq/ L  infusion, , Intravenous, Continuous, Gillie Duncans, MD, Stopped at 02/07/24 2203   acetaminophen  (TYLENOL ) tablet 650 mg, 650 mg, Oral, Q4H PRN **OR** acetaminophen  (TYLENOL ) suppository 650 mg, 650 mg, Rectal, Q4H PRN, Cabbell, Kyle, MD   albuterol  (PROVENTIL ) (2.5 MG/3ML) 0.083% nebulizer solution 2.5 mg, 2.5 mg, Nebulization, Q4H PRN, Cabbell, Kyle, MD   aspirin  EC tablet 325 mg, 325 mg, Oral, Daily, Cabbell, Kyle, MD, 325 mg at 02/09/24 1030   diazepam  (VALIUM ) tablet 5 mg, 5 mg, Oral, Q6H PRN, Cabbell, Kyle, MD   DULoxetine  (CYMBALTA ) DR capsule 120 mg, 120 mg, Oral, QHS, Cabbell, Kyle, MD, 120 mg at 02/08/24 2047   ezetimibe  (ZETIA ) tablet 10 mg, 10 mg, Oral, Daily, Cabbell, Kyle, MD, 10 mg at 02/09/24 1030   fenofibrate  tablet 160 mg, 160 mg, Oral, Daily, Cabbell, Kyle, MD, 160 mg at 02/09/24 1030   furosemide  (LASIX ) tablet 10-20 mg, 10-20 mg, Oral, Daily PRN, Cabbell, Kyle, MD   gabapentin  (NEURONTIN ) capsule 200 mg, 200 mg, Oral, QHS, Cabbell, Kyle, MD, 200 mg at 02/08/24 2046   gabapentin  (NEURONTIN ) capsule 800 mg, 800 mg, Oral, BID, Cabbell, Kyle, MD, 800 mg at 02/09/24 0845   hydrochlorothiazide  (HYDRODIURIL ) tablet 25 mg, 25 mg, Oral, Daily, Cabbell, Kyle, MD, 25 mg at 02/09/24 0845   insulin  aspart (novoLOG ) injection 8 Units, 8 Units, Subcutaneous, BH-qamhs, Cabbell, Kyle, MD, 8 Units at 02/09/24 0850   insulin  glargine (LANTUS ) injection 24 Units, 24 Units, Subcutaneous, QHS, Cabbell, Kyle, MD, 24 Units at 02/08/24 2117   menthol -cetylpyridinium (CEPACOL) lozenge 3 mg, 1 lozenge, Oral, PRN **OR** phenol (CHLORASEPTIC) mouth spray 1 spray, 1 spray, Mouth/Throat, PRN, Cabbell, Kyle, MD   metFORMIN  (GLUCOPHAGE -XR) 24 hr tablet 500 mg, 500 mg, Oral, TID AC, Cabbell, Kyle, MD, 500 mg at 02/09/24 1124   ondansetron  (ZOFRAN -ODT) disintegrating tablet 4 mg, 4 mg, Oral, Q8H PRN, Cabbell, Kyle, MD   oxyCODONE  (Oxy IR/ROXICODONE ) immediate release tablet 10 mg, 10 mg,  Oral, Q3H PRN, Cabbell, Kyle, MD, 10 mg at 02/09/24 0607   oxyCODONE  (Oxy IR/ROXICODONE ) immediate release tablet 5 mg, 5 mg, Oral, Q3H PRN, Cabbell, Kyle, MD   pantoprazole  (PROTONIX ) EC tablet 40 mg, 40 mg, Oral, Daily PRN, Cabbell, Kyle, MD   polyethylene glycol (MIRALAX  / GLYCOLAX ) packet 17 g, 17 g, Oral, Daily PRN, Cabbell, Kyle, MD   potassium chloride  SA (KLOR-CON  M) CR tablet 20 mEq, 20 mEq, Oral, Daily PRN, Cabbell, Kyle, MD   prenatal vitamin w/FE, FA (NATACHEW)  chewable tablet 1 tablet, 1 tablet, Oral, Q1200, Cabbell, Kyle, MD, 1 tablet at 02/09/24 1124   senna (SENOKOT) tablet 8.6 mg, 1 tablet, Oral, BID, Cabbell, Kyle, MD, 8.6 mg at 02/09/24 1006   sodium chloride  flush (NS) 0.9 % injection 3 mL, 3 mL, Intravenous, Q12H, Cabbell, Kyle, MD, 3 mL at 02/09/24 1006   sodium chloride  flush (NS) 0.9 % injection 3 mL, 3 mL, Intravenous, PRN, Cabbell, Kyle, MD  Patients Current Diet:  Diet Order             Diet heart healthy/carb modified Room service appropriate? Yes; Fluid consistency: Thin  Diet effective now                   Precautions / Restrictions Precautions Precautions: Fall, Back Precaution/Restrictions Comments: verbally reviewed back precautions during functional tasks Restrictions Weight Bearing Restrictions Per Provider Order: No Other Position/Activity Restrictions: back precautions   Has the patient had 2 or more falls or a fall with injury in the past year?Yes  Prior Activity Level Limited Community (1-2x/wk): gets out of house ~2-3 days/week  Prior Functional Level Prior Function Prior Level of Function : Independent/Modified Independent, History of Falls (last six months) Mobility Comments: mod I with loftstrand crutches for house hold distances, w/c for longer community distances; had 1 fall in the past 6 months, foot caught on cabinet when getting dog a treat ADLs Comments: Ind to Mod I with ADLs, donning/doffing L LE prosthesis, medication  management, and light meal prep; family assists with other IADLs; has transportation service through insurance and family also assists with transportation  Self Care: Did the patient need help bathing, dressing, using the toilet or eating?  Independent  Indoor Mobility: Did the patient need assistance with walking from room to room (with or without device)? Independent  Stairs: Did the patient need assistance with internal or external stairs (with or without device)? Needed some help  Functional Cognition: Did the patient need help planning regular tasks such as shopping or remembering to take medications? Independent  Patient Information Are you of Hispanic, Latino/a,or Spanish origin?: A. No, not of Hispanic, Latino/a, or Spanish origin What is your race?: A. White Do you need or want an interpreter to communicate with a doctor or health care staff?: 0. No  Patient's Response To:  Health Literacy and Transportation Is the patient able to respond to health literacy and transportation needs?: Yes Health Literacy - How often do you need to have someone help you when you read instructions, pamphlets, or other written material from your doctor or pharmacy?: Never In the past 12 months, has lack of transportation kept you from medical appointments or from getting medications?: Yes In the past 12 months, has lack of transportation kept you from meetings, work, or from getting things needed for daily living?: No  Home Assistive Devices / Equipment Home Equipment: Tub bench, Hand held shower head, Other (comment), Wheelchair - manual, Rollator (4 wheels), Rolling Walker (2 wheels) (loftstrand crutches; L LE prosthesis; R ankle brace for long distance gait; bed rail)  Prior Device Use: Indicate devices/aids used by the patient prior to current illness, exacerbation or injury? Orthotics/Prosthetics and forearm crutches  Current Functional Level Cognition  Orientation Level: Oriented X4     Extremity Assessment (includes Sensation/Coordination)  Upper Extremity Assessment: Right hand dominant, RUE deficits/detail, LUE deficits/detail RUE Deficits / Details: gross strength 4/5; hx of peripheral neuropathy; AROM and coordination WFL RUE Sensation: history of peripheral neuropathy RUE Coordination: WNL  LUE Deficits / Details: gross strength 4- to 4/5; hx of peripheral neuropathy; hx of shoulder fx with mildly decreased internal/external shoulder rotation at baseline but with AROM largely [facility] and all other AROM WFL; coordination WFL LUE Sensation: history of peripheral neuropathy  Lower Extremity Assessment: Defer to PT evaluation RLE Deficits / Details: wears brace 2/2 ankle instability, reports similar to charcot foot LLE Deficits / Details: LLE prosthesis    ADLs  Overall ADL's : Needs assistance/impaired Eating/Feeding: Set up, Sitting Grooming: Set up, Sitting Upper Body Bathing: Minimal assistance, Sitting (adhering to back precautions) Lower Body Bathing: Maximal assistance, Sitting/lateral leans, Sit to/from stand, Cueing for compensatory techniques (adhering to back precautions) Upper Body Dressing : Contact guard assist, Sitting, Cueing for compensatory techniques (adhering to back precautions) Lower Body Dressing: Maximal assistance, Sitting/lateral leans, Sit to/from stand, Cueing for compensatory techniques (adhering to back precautions) Toilet Transfer: Minimal assistance, +2 for physical assistance, +2 for safety/equipment, Rolling walker (2 wheels), BSC/3in1, Standard walker (step-pivot; with L LE prosthesis donned) Toileting- Clothing Manipulation and Hygiene: Maximal assistance, Sit to/from stand, Cueing for compensatory techniques (adhering to back precautions) Functional mobility during ADLs: Minimal assistance, +2 for physical assistance, +2 for safety/equipment, Rolling walker (2 wheels) (with L LE prosthesis donned) General ADL Comments: Pt requiring Max  assist for donning LLE prosthesis & RLE shoe (declines wearing R ankle brace for short distance gait) this session as pt unable to donn them while maintaining back precautions. Pt with decreased activity tolerance, fatiguing quickly during tasks.    Mobility  Overal bed mobility: Needs Assistance Bed Mobility: Rolling, Sidelying to Sit Rolling: Supervision, Used rails Sidelying to sit: Min assist, HOB elevated, Used rails General bed mobility comments: cuing for LE positioning for log rolling, holds to PT's hand to upright trunk, extra time, exits R side of bed with more success compared to L side yesterday    Transfers  Overall transfer level: Needs assistance Equipment used: Rolling walker (2 wheels) Transfers: Sit to/from Stand Sit to Stand: Contact guard assist Bed to/from chair/wheelchair/BSC transfer type:: Step pivot Step pivot transfers: Min assist, +2 physical assistance, +2 safety/equipment (bed>BSC on R with RW) General transfer comment: sit>stand from EOB with cuing re: hand placement    Ambulation / Gait / Stairs / Wheelchair Mobility  Ambulation/Gait Ambulation/Gait assistance: Contact guard assist, Min assist Gait Distance (Feet): 30 Feet Assistive device: Rolling walker (2 wheels) Gait Pattern/deviations: Decreased step length - right, Decreased step length - left, Decreased stride length General Gait Details: Pt ambulates around bed to door & back with RW & CGA<>min assist. Pt maintains slight L knee flexion throughout gait but reports this is baseline. Pt requires cuing to stay within base of AD especially when turning around & education re: hand placement on RW. Gait velocity: decreased    Posture / Balance Balance Overall balance assessment: Needs assistance Sitting-balance support: Feet supported, Single extremity supported, No upper extremity supported Sitting balance-Leahy Scale: Fair Standing balance support: Bilateral upper extremity supported, During  functional activity, Reliant on assistive device for balance Standing balance-Leahy Scale: Fair    Special considerations/ Life events Continuous Drip IV  ***, Skin Surgical Incision: back, and Diabetic management Lantus  24 units daily at bedtime; Novolog  8 units      Previous Home Environment (from acute therapy documentation) Living Arrangements: Other relatives, Children (son, daughter-in-law, grandson)  Lives With: Son Available Help at Discharge: Family, Friend(s), Available 24 hours/day Type of Home: House Home Layout: One level Home Access: Ramped entrance  Bathroom Shower/Tub: Associate Professor: Yes How Accessible: Accessible via walker Additional Comments: Pt reports Rollator brakes are broken  Discharge Living Setting Plans for Discharge Living Setting: Patient's home Type of Home at Discharge: House Discharge Home Layout: One level Discharge Home Access: Ramped entrance Discharge Bathroom Shower/Tub: Tub/shower unit Discharge Bathroom Toilet: Standard Discharge Bathroom Accessibility: Yes How Accessible: Accessible via walker Does the patient have any problems obtaining your medications?: No  Social/Family/Support Systems Anticipated Caregiver: Astronomer (friend), Milderd Callas (friend) and Sidra (son) Anticipated Caregiver's Contact Information: Vickie: [phone]: [phone] Caregiver Availability: 24/7 Discharge Plan Discussed with Primary Caregiver: Yes Is Caregiver In Agreement with Plan?: Yes Does Caregiver/Family have Issues with Lodging/Transportation while Pt is in Rehab?: No   Goals Patient/Family Goal for Rehab: *** Expected length of stay: *** Pt/Family Agrees to Admission and willing to participate: Yes Program Orientation Provided & Reviewed with Pt/Caregiver Including Roles  & Responsibilities: Yes   Decrease burden of Care through IP rehab admission: NA   Possible need for SNF  placement upon discharge:Not anticipated   Patient Condition: {PATIENT'S CONDITION:22832}  Preadmission Screen Completed By:  Tinnie SHAUNNA Yvone Delayne, CCC-SLP, 02/09/2024 1:40 PM ______________________________________________________________________   Discussed status with Dr. PIERRETTEon***at *** and received approval for admission today.  Admission Coordinator:  Tinnie SHAUNNA Yvone Delayne, time***/Date***

## 2024-02-09 NOTE — Plan of Care (Signed)
  Problem: Education: Goal: Knowledge of General Education information will improve Description: Including pain rating scale, medication(s)/side effects and non-pharmacologic comfort measures Outcome: Progressing   Problem: Education: Goal: Knowledge of General Education information will improve Description: Including pain rating scale, medication(s)/side effects and non-pharmacologic comfort measures 02/09/2024 1827 by Jearlean Katz I, RN Outcome: Progressing 02/09/2024 1827 by Jearlean Katz FERNS, RN Outcome: Progressing   Problem: Health Behavior/Discharge Planning: Goal: Ability to manage health-related needs will improve Outcome: Progressing   Problem: Clinical Measurements: Goal: Ability to maintain clinical measurements within normal limits will improve Outcome: Progressing Goal: Will remain free from infection Outcome: Progressing Goal: Diagnostic test results will improve Outcome: Progressing Goal: Respiratory complications will improve Outcome: Progressing Goal: Cardiovascular complication will be avoided Outcome: Progressing   Problem: Activity: Goal: Risk for activity intolerance will decrease Outcome: Progressing   Problem: Nutrition: Goal: Adequate nutrition will be maintained Outcome: Progressing   Problem: Coping: Goal: Level of anxiety will decrease Outcome: Progressing   Problem: Elimination: Goal: Will not experience complications related to bowel motility Outcome: Progressing Goal: Will not experience complications related to urinary retention Outcome: Progressing   Problem: Pain Managment: Goal: General experience of comfort will improve and/or be controlled Outcome: Progressing   Problem: Skin Integrity: Goal: Risk for impaired skin integrity will decrease Outcome: Progressing   Problem: Safety: Goal: Ability to remain free from injury will improve Outcome: Progressing   Problem: Education: Goal: Ability to verbalize activity precautions  or restrictions will improve Outcome: Progressing Goal: Knowledge of the prescribed therapeutic regimen will improve Outcome: Progressing Goal: Understanding of discharge needs will improve Outcome: Progressing

## 2024-02-10 DIAGNOSIS — M4316 Spondylolisthesis, lumbar region: Secondary | ICD-10-CM

## 2024-02-10 DIAGNOSIS — Z794 Long term (current) use of insulin: Secondary | ICD-10-CM

## 2024-02-10 DIAGNOSIS — M51369 Other intervertebral disc degeneration, lumbar region without mention of lumbar back pain or lower extremity pain: Secondary | ICD-10-CM | POA: Diagnosis not present

## 2024-02-10 DIAGNOSIS — E1142 Type 2 diabetes mellitus with diabetic polyneuropathy: Secondary | ICD-10-CM | POA: Diagnosis not present

## 2024-02-10 DIAGNOSIS — Z89512 Acquired absence of left leg below knee: Secondary | ICD-10-CM | POA: Diagnosis not present

## 2024-02-10 DIAGNOSIS — M14671 Charcot's joint, right ankle and foot: Secondary | ICD-10-CM | POA: Diagnosis not present

## 2024-02-10 LAB — GLUCOSE, CAPILLARY: Glucose-Capillary: 192 mg/dL — ABNORMAL HIGH (ref 70–99)

## 2024-02-10 NOTE — Plan of Care (Signed)

## 2024-02-10 NOTE — Plan of Care (Signed)
  Problem: Education: Goal: Knowledge of General Education information will improve Description: Including pain rating scale, medication(s)/side effects and non-pharmacologic comfort measures Outcome: Progressing   Problem: Health Behavior/Discharge Planning: Goal: Ability to manage health-related needs will improve Outcome: Progressing   Problem: Clinical Measurements: Goal: Ability to maintain clinical measurements within normal limits will improve Outcome: Progressing Goal: Will remain free from infection Outcome: Progressing Goal: Diagnostic test results will improve Outcome: Progressing Goal: Respiratory complications will improve Outcome: Progressing Goal: Cardiovascular complication will be avoided Outcome: Progressing   Problem: Activity: Goal: Risk for activity intolerance will decrease Outcome: Progressing   Problem: Nutrition: Goal: Adequate nutrition will be maintained Outcome: Progressing   Problem: Coping: Goal: Level of anxiety will decrease Outcome: Progressing   Problem: Elimination: Goal: Will not experience complications related to bowel motility Outcome: Progressing Goal: Will not experience complications related to urinary retention Outcome: Progressing   Problem: Pain Managment: Goal: General experience of comfort will improve and/or be controlled Outcome: Progressing   Problem: Safety: Goal: Ability to remain free from injury will improve Outcome: Progressing   Problem: Skin Integrity: Goal: Risk for impaired skin integrity will decrease Outcome: Progressing   Problem: Education: Goal: Ability to verbalize activity precautions or restrictions will improve Outcome: Progressing Goal: Knowledge of the prescribed therapeutic regimen will improve Outcome: Progressing Goal: Understanding of discharge needs will improve Outcome: Progressing

## 2024-02-10 NOTE — Consult Note (Addendum)
 Physical Medicine and Rehabilitation Consult Reason for Consult: Impaired functional mobility Referring Physician: Gillie   HPI: Olivia Romero is a 68 y.o. female with a history of prior CVA, lumbar spine surgery as well as diabetic peripheral neuropathy Charcot feet s/p left below-knee amputation who had increasing low back pain and signs of neurogenic claudication by report and on exam.  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  She failed conservative measures and on 02/06/2024 patient underwent PLIF L3-L4 with hardware removal and L5 at nonsegmental pedicle fixation L3-L4 by Dr. Gillie.  She has been up with physical therapy and on Friday was transferred sit to stand at [facility] assistance level and was ambulating 30 feet contact-guard assist using rolling walker.  Patient was independent prior to arrival but does have a history of falls.  He uses Loftstrand crutches in the house and a wheelchair for community distances.  She lives with her son and daughter-in-law in 1 level house with ramped entry.    Home: Home Living Family/patient expects to be discharged to:: Private residence Living Arrangements: Other relatives, Children (son, daughter-in-law, grandson) Available Help at Discharge: Family, Friend(s), Available 24 hours/day Type of Home: House Home Access: Ramped entrance Home Layout: One level Bathroom Shower/Tub: Engineer, manufacturing systems: Standard Bathroom Accessibility: Yes Home Equipment: Tub bench, Hand held shower head, Other (comment), Wheelchair - manual, Rollator (4 wheels), Rolling Walker (2 wheels) (loftstrand crutches; L LE prosthesis; R ankle brace for long distance gait; bed rail) Additional Comments: Pt reports Rollator brakes are broken  Lives With: Son  Functional History: Prior Function Prior Level of Function : Independent/Modified Independent,  History of Falls (last six months) Mobility Comments: mod I with loftstrand crutches for house hold distances, w/c for longer community distances; had 1 fall in the past 6 months, foot caught on cabinet when getting dog a treat ADLs Comments: Ind to Mod I with ADLs, donning/doffing L LE prosthesis, medication management, and light meal prep; family assists with other IADLs; has transportation service through insurance and family also assists with transportation Functional Status:  Mobility: Bed Mobility Overal bed mobility: Needs Assistance Bed Mobility: Rolling, Sidelying to Sit Rolling: Supervision, Used rails Sidelying to sit: Min assist, HOB elevated, Used rails General bed mobility comments: cuing for LE positioning for log rolling, holds to PT's hand to upright trunk, extra time, exits R side of bed with more success compared to L side yesterday Transfers Overall transfer level: Needs assistance Equipment used: Rolling walker (2 wheels) Transfers: Sit to/from Stand Sit to Stand: Contact guard assist Bed to/from chair/wheelchair/BSC transfer type:: Step pivot Step pivot transfers: Min assist, +2 physical assistance, +2 safety/equipment (bed>BSC on R with RW) General transfer comment: sit>stand from EOB with cuing re: hand placement Ambulation/Gait Ambulation/Gait assistance: Contact guard assist, Min assist Gait Distance (Feet): 30 Feet Assistive device: Rolling walker (2 wheels) Gait Pattern/deviations: Decreased step length - right, Decreased step length - left, Decreased stride length General Gait Details: Pt ambulates around bed to door & back with RW & CGA<>min assist. Pt maintains slight L knee flexion throughout gait but reports this is baseline. Pt requires cuing to stay within base of AD especially when turning around & education re: hand placement on RW. Gait velocity: decreased    ADL: ADL Overall ADL's : Needs assistance/impaired Eating/Feeding: Set up,  Sitting Grooming: Set up, Sitting Upper Body Bathing: Minimal assistance, Sitting (  adhering to back precautions) Lower Body Bathing: Maximal assistance, Sitting/lateral leans, Sit to/from stand, Cueing for compensatory techniques (adhering to back precautions) Upper Body Dressing : Contact guard assist, Sitting, Cueing for compensatory techniques (adhering to back precautions) Lower Body Dressing: Maximal assistance, Sitting/lateral leans, Sit to/from stand, Cueing for compensatory techniques (adhering to back precautions) Toilet Transfer: Minimal assistance, +2 for physical assistance, +2 for safety/equipment, Rolling walker (2 wheels), BSC/3in1, Standard walker (step-pivot; with L LE prosthesis donned) Toileting- Clothing Manipulation and Hygiene: Maximal assistance, Sit to/from stand, Cueing for compensatory techniques (adhering to back precautions) Functional mobility during ADLs: Minimal assistance, +2 for physical assistance, +2 for safety/equipment, Rolling walker (2 wheels) (with L LE prosthesis donned) General ADL Comments: Pt requiring Max assist for donning LLE prosthesis & RLE shoe (declines wearing R ankle brace for short distance gait) this session as pt unable to donn them while maintaining back precautions. Pt with decreased activity tolerance, fatiguing quickly during tasks.  Cognition: Cognition Orientation Level: Oriented X4 Cognition Arousal: Alert Behavior During Therapy: WFL for tasks assessed/performed, Anxious   Review of Systems  Constitutional: Negative.   HENT: Negative.    Eyes: Negative.   Respiratory: Negative.    Cardiovascular: Negative.   Gastrointestinal: Negative.   Genitourinary:  Negative for dysuria and urgency.  Musculoskeletal:  Positive for back pain, falls and myalgias.  Skin: Negative.   Neurological:  Positive for sensory change and focal weakness.  Psychiatric/Behavioral: Negative.     Past Medical History:  Diagnosis Date   Anemia     Arthritis    Asthma    Colon polyps    adenomatous   Diabetes mellitus without complication (HCC)    Diabetic infection of left foot (HCC) 12/2018   Diverticulosis of colon    Dysphagia 12/31/2014   Esophagitis    GERD (gastroesophageal reflux disease)    Hemorrhoid    internal   Hyperlipemia    Hypertension    IBS (irritable bowel syndrome)    no current prob - diet controlled   MRSA bacteremia 12/05/2018   Myalgia due to statin 11/19/2017   Neuropathy    Neuropathy of both feet    Neuropathy of hand    Pneumonia    x 4   PONV (postoperative nausea and vomiting)    on some surgeries but not all procedures   Skin ulcer of right ankle, limited to breakdown of skin (HCC) 12/31/2016   resolved per patient 05/14/19   Sleep apnea    has had in the past lost 50 pounds and do longer uses cpap   Statin intolerance 04/23/2015   Stroke (HCC) 2009   mini stroke per patient   Stroke-like episode 2009   TIA - mini stroke per patient   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 07/13/2015   Uncontrolled type 2 diabetes mellitus with hyperglycemia (HCC) 11/01/2017   Past Surgical History:  Procedure Laterality Date   ABDOMINAL HYSTERECTOMY     AMPUTATION Left 12/06/2018   Procedure: PARTIAL AMPUTATION LEFT FOOT;  Surgeon: Janit Thresa HERO, DPM;  Location: [facility] OR;  Service: Podiatry;  Laterality: Left;   AMPUTATION Left 05/16/2019   Procedure: LEFT BELOW KNEE AMPUTATION;  Surgeon: Harden Jerona GAILS, MD;  Location[facility] OR;  Service: Orthopedics;  Laterality: Left;   APPENDECTOMY     removed in 11th grade   BACK SURGERY  09/02/2015   BONE BIOPSY Left 12/06/2018   Procedure: Bone Biopsy;  Surgeon: Janit Thresa HERO, DPM;  Location: [facility] OR;  Service: Podiatry;  Laterality: Left;   CERVICAL FUSION     CESAREAN SECTION  1986   COLONOSCOPY N/A 11/30/2012   Procedure: COLONOSCOPY;  Surgeon: Norleen LOISE Kiang, MD;  Location: [facility] ENDOSCOPY;  Service: Endoscopy;  Laterality: N/A;   COLONOSCOPY  N/A 09/05/2021   Procedure: COLONOSCOPY;  Surgeon: Maryruth Ole DASEN, MD;  Location: [facility] ENDOSCOPY;  Service: Endoscopy;  Laterality: N/A;   COLONOSCOPY WITH PROPOFOL  N/A 01/03/2016   Procedure: COLONOSCOPY WITH PROPOFOL ;  Surgeon: Lamar DASEN Holmes, MD;  Location: [facility] ENDOSCOPY;  Service: Endoscopy;  Laterality: N/A;   ESOPHAGOGASTRODUODENOSCOPY N/A 09/05/2021   Procedure: ESOPHAGOGASTRODUODENOSCOPY (EGD);  Surgeon: Maryruth Ole DASEN, MD;  Location: [facility] ENDOSCOPY;  Service: Endoscopy;  Laterality: N/A;   ESOPHAGOGASTRODUODENOSCOPY (EGD) WITH PROPOFOL  N/A 01/14/2015   Procedure: ESOPHAGOGASTRODUODENOSCOPY (EGD) WITH PROPOFOL ;  Surgeon: Lamar DASEN Holmes, MD;  Location: [facility] ENDOSCOPY;  Service: Endoscopy;  Laterality: N/A;   IRRIGATION AND DEBRIDEMENT FOOT Left 03/13/2018   Procedure: IRRIGATION AND DEBRIDEMENT FOOT WITH BONE BIOPSY WITH MISONIX DEBRIDER;  Surgeon: Gretel Ozell PARAS, DPM;  Location: [facility] OR;  Service: Podiatry;  Laterality: Left;   IRRIGATION AND DEBRIDEMENT FOOT Left 12/06/2018   Procedure: Irrigation And Debridement Foot;  Surgeon: Janit Thresa HERO, DPM;  Location: [facility] OR;  Service: Podiatry;  Laterality: Left;   LUMBAR WOUND DEBRIDEMENT N/A 10/01/2015   Procedure: LUMBAR WOUND DEBRIDEMENT;  Surgeon: Rockey Peru, MD;  Location: [facility] NEURO ORS;  Service: Neurosurgery;  Laterality: N/A;  LUMBAR WOUND DEBRIDEMENT   NASAL SINUS SURGERY     SAVORY DILATION N/A 01/14/2015   Procedure: SAVORY DILATION;  Surgeon: Lamar DASEN Holmes, MD;  Location: [facility] ENDOSCOPY;  Service: Endoscopy;  Laterality: N/A;   TONSILLECTOMY     WISDOM TOOTH EXTRACTION     Family History  Problem Relation Age of Onset   Lung cancer Father    Hypertension Father    Arthritis Father    Other Mother        hardening of the arteries/renal cell carcenoma   Hypertension Mother    Stroke Mother    Kidney cancer Mother    Arthritis Brother    Rheum arthritis Maternal Uncle    Bladder Cancer Neg Hx    Social  History:  reports that she has never smoked. She has never used smokeless tobacco. She reports that she does not drink alcohol and does not use drugs. Allergies:  Allergies  Allergen Reactions   Egg-Derived Products Swelling and Other (See Comments)    Angioedema   Atorvastatin Rash    Rash itching    Tirzepatide  Other (See Comments)   Influenza Virus Vaccine Swelling    Arm swelled (site of injection)   Latex Itching   Pravastatin Itching and Rash   Tape Rash and Other (See Comments)    Adhesive on foot pull off skin.  Ok to use paper tape.   Medications Prior to Admission  Medication Sig Dispense Refill   acetaminophen  (TYLENOL ) 500 MG tablet Take 1,000 mg by mouth every 6 (six) hours as needed for mild pain (pain score 1-3).     aspirin  EC 325 MG tablet Take 1 tablet (325 mg total) by mouth daily. 30 tablet 0   DULoxetine  (CYMBALTA ) 60 MG capsule Take 120 mg by mouth at bedtime.     ezetimibe  (ZETIA ) 10 MG tablet TAKE ONE TABLET (10 MG TOTAL) BY MOUTH DAILY. 90 tablet 0   fenofibrate  (TRICOR ) 145 MG tablet TAKE ONE TABLET (145 MG TOTAL) BY MOUTH DAILY. 90  tablet 0   furosemide  (LASIX ) 20 MG tablet Take 0.5-1 tablets (10-20 mg total) by mouth daily as needed. For severe fluid retention in lower extremity.  Take in am with potassium PRN 10 tablet 0   gabapentin  (NEURONTIN ) 100 MG capsule Take 200 mg by mouth at bedtime.     gabapentin  (NEURONTIN ) 800 MG tablet Take 800 mg by mouth 2 (two) times daily.     hydrochlorothiazide  (HYDRODIURIL ) 25 MG tablet TAKE ONE TABLET (25 MG TOTAL) BY MOUTH DAILY. 90 tablet 0   insulin  aspart (NOVOLOG ) 100 UNIT/ML injection Inject 8 Units into the skin in the morning and at bedtime.     insulin  degludec (TRESIBA ) 200 UNIT/ML FlexTouch Pen Inject 24 Units into the skin at bedtime.     metFORMIN  (GLUCOPHAGE -XR) 500 MG 24 hr tablet Take 500 mg by mouth 3 (three) times daily.      Multiple Vitamins-Minerals (MULTIVITAMIN GUMMIES WOMENS PO) Take 1 tablet by  mouth daily.     pantoprazole  (PROTONIX ) 40 MG tablet Take 1 tablet (40 mg total) by mouth daily. (Patient taking differently: Take 40 mg by mouth daily as needed (acid reflux).) 90 tablet 1   albuterol  (VENTOLIN  HFA) 108 (90 Base) MCG/ACT inhaler Inhale 2 puffs into the lungs every 4 (four) hours as needed for wheezing or shortness of breath. 6.7 g 0   beclomethasone (QVAR  REDIHALER) 80 MCG/ACT inhaler Inhale 2 puffs into the lungs 2 (two) times daily. (Patient not taking: Reported on 01/24/2024) 1 each 1   benzonatate  (TESSALON  PERLES) 100 MG capsule Take 2 capsules (200 mg total) by mouth 3 (three) times daily as needed for cough. (Patient not taking: Reported on 01/24/2024) 20 capsule 0   Blood Glucose Monitoring Suppl (CONTOUR NEXT MONITOR) w/Device KIT as directed.     colchicine  0.6 MG tablet Take 1 tablet (0.6 mg total) by mouth daily. (Patient taking differently: Take 0.6 mg by mouth daily as needed (gout flare).) 10 tablet 0   Incontinence Supply Disposable (ASSURANCE FITTED BRIEF LARGE) MISC Use 1 brief up to 6 times a day as needed for urinary incontinence 180 each 5   meclizine  (ANTIVERT ) 25 MG tablet Take 0.5-1 tablets (12.5-25 mg total) by mouth 3 (three) times daily as needed for dizziness (vertigo sx). (Patient not taking: Reported on 01/24/2024) 30 tablet 1   ondansetron  (ZOFRAN -ODT) 4 MG disintegrating tablet Take 1 tablet (4 mg total) by mouth every 8 (eight) hours as needed for refractory nausea / vomiting. (Patient not taking: Reported on 01/24/2024) 10 tablet 0   OZEMPIC , 0.25 OR 0.5 MG/DOSE, 2 MG/3ML SOPN Inject 0.5 mg into the skin every 7 (seven) days.     polyethylene glycol (MIRALAX  / GLYCOLAX ) 17 g packet Take 17 g by mouth daily as needed for moderate constipation.     potassium chloride  SA (KLOR-CON  M) 20 MEQ tablet Take 1 tablet (20 mEq total) by mouth daily as needed (take on days when taking lasix /furosemide ). (Patient not taking: Reported on 01/24/2024) 20 tablet 0    predniSONE  (STERAPRED UNI-PAK 21 TAB) 10 MG (21) TBPK tablet Take as directed on package.  (60 mg po on day 1, 50 mg po on day 2...) (Patient not taking: Reported on 01/24/2024) 21 tablet 0   ULTICARE SHORT PEN NEEDLES 31G X 8 MM MISC        Blood pressure (!) 121/59, pulse (!) 101, temperature 98.5 F (36.9 C), temperature source Oral, resp. rate 16, height 4' 11 (1.499 m), weight 82.6 kg,  SpO2 99%. Physical Exam Constitutional:      General: She is not in acute distress.    Appearance: She is obese.  HENT:     Head: Normocephalic and atraumatic.     Right Ear: External ear normal.     Left Ear: External ear normal.     Nose: Nose normal.     Mouth/Throat:     Mouth: Mucous membranes are moist.  Eyes:     Extraocular Movements: Extraocular movements intact.     Pupils: Pupils are equal, round, and reactive to light.  Cardiovascular:     Rate and Rhythm: Normal rate.  Pulmonary:     Effort: Pulmonary effort is normal.  Abdominal:     Palpations: Abdomen is soft.  Musculoskeletal:        General: No swelling.     Cervical back: Normal range of motion.     Comments: Wearing left BK prosthesis which appears to be fitting. Has charcot deformity of right foot.   Skin:    General: Skin is warm.     Comments: Back incision dressed  Neurological:     Comments: Alert and oriented x 3. Normal insight and awareness. Intact Memory. Normal language and speech. Cranial nerve exam unremarkable. MMT: BUE 4+/5 deltoid,bicep, tricep, 4/ wrist and 4- HI's. LE 3- prox (pain), KE 3+ to 4/5. RADF/PF 1-2/5. Pt with stocking glove sensory loss below the knees and wrists bilaterally. She has no LT below the supramalleolar level and along distal phalanges bilaterally. DTR's 1+.    Psychiatric:        Mood and Affect: Mood normal.        Behavior: Behavior normal.     Results for orders placed or performed during the hospital encounter of 02/06/24 (from the past 24 hours)  Glucose, capillary      Status: Abnormal   Collection Time: 02/09/24  4:29 PM  Result Value Ref Range   Glucose-Capillary 149 (H) 70 - 99 mg/dL  Glucose, capillary     Status: Abnormal   Collection Time: 02/09/24  9:37 PM  Result Value Ref Range   Glucose-Capillary 204 (H) 70 - 99 mg/dL   *Note: Due to a large number of results and/or encounters for the requested time period, some results have not been displayed. A complete set of results can be found in Results Review.   No results found.  Assessment/Plan: Diagnosis: 68 year old female with a hx of diabetic peripheral neuropathy, charcot feet and left BKA (with prosthesis) s/p lumbar neurogenic claudication status post L3-L4 PLIF Does the need for close, 24 hr/day medical supervision in concert with the patient's rehab needs make it unreasonable for this patient to be served in a less intensive setting? Potentially Co-Morbidities requiring supervision/potential complications:  - Pain management -orthotic/prosthetic considerations -wound care -slow transit constipation/neurogenic bowel Due to bladder management, bowel management, safety, skin/wound care, disease management, medication administration, pain management, and patient education, does the patient require 24 hr/day rehab nursing? Yes Does the patient require coordinated care of a physician, rehab nurse, therapy disciplines of PT, OT to address physical and functional deficits in the context of the above medical diagnosis(es)? Yes Addressing deficits in the following areas: balance, endurance, locomotion, strength, transferring, bowel/bladder control, bathing, dressing, feeding, grooming, toileting, and psychosocial support Can the patient actively participate in an intensive therapy program of at least 3 hrs of therapy per day at least 5 days per week? Yes The potential for patient to make measurable gains while on  inpatient rehab is excellent Anticipated functional outcomes upon discharge from inpatient  rehab are modified independent  with PT, modified independent and supervision with OT, n/a with SLP. Estimated rehab length of stay to reach the above functional goals is: 7-9 dayys Anticipated discharge destination: Home Overall Rehab/Functional Prognosis: excellent  POST ACUTE RECOMMENDATIONS: This patient's condition is appropriate for continued rehabilitative care in the following setting: CIR Patient has agreed to participate in recommended program. Yes Note that insurance prior authorization may be required for reimbursement for recommended care.  Comment: Pt has family at home but they work. She might have a friend who can check in on her. Pt needs to be able to don/doff her left BK prosthesis as well as orthotic for right charcot foot. She is very motivated and wants to regain independence. Rehab Admissions Coordinator to follow up.        I have personally performed a face to face diagnostic evaluation of this patient. Additionally, I have examined the patient's medical record including any pertinent labs and radiographic images.    Thanks,  Arthea ONEIDA Gunther, MD 02/10/2024

## 2024-02-11 LAB — GLUCOSE, CAPILLARY
Glucose-Capillary: 201 mg/dL — ABNORMAL HIGH (ref 70–99)
Glucose-Capillary: 161 mg/dL — ABNORMAL HIGH (ref 70–99)
Glucose-Capillary: 206 mg/dL — ABNORMAL HIGH (ref 70–99)
Glucose-Capillary: 205 mg/dL — ABNORMAL HIGH (ref 70–99)

## 2024-02-11 NOTE — Progress Notes (Signed)
 Pt transferred from chair to bed. Bed alarm on. Call light in reach. Bed in lowest position. All needs met at this time

## 2024-02-11 NOTE — Progress Notes (Signed)
 Physical Therapy Treatment Patient Details Name: Olivia Romero MRN: [id number] DOB: 09-03-1955 Today's Date: 02/11/2024   History of Present Illness Pt is a 68 y/o F admitted on 02/06/24 for scheduled posterior lumbar interbody arthrodesis L3-L4, hardware removal L5, nonsegmental pedicle fixation L3-L4. PMH: anemia, asthma, dysphagia, GERD, HLD, HTN, IBS, neuropathy, stroke, DM2, L BKA    PT Comments  Pt up in chair on arrival and agreeable to session with continued progress towards acute goals. Pt needing max A to don R foot brace and shoe with pt verbalizing understanding of ways to don with adherence to back precautions. With increased time pt able to rise from various height surfaces to standing with CGA for safety and light cues for optimal hand placement. Pt continues to demonstrate short household distance gait with RW for support and up to min A to maintain balance with distance limited by fatigue and RLE radicular pain. Patient will benefit from intensive inpatient follow-up therapy, >3 hours/day, will continue to follow acutely.    If plan is discharge home, recommend the following: A little help with walking and/or transfers;A little help with bathing/dressing/bathroom;Assistance with cooking/housework;Assist for transportation;Help with stairs or ramp for entrance   Can travel by private vehicle        Equipment Recommendations  BSC/3in1    Recommendations for Other Services       Precautions / Restrictions Precautions Precautions: Fall;Back Recall of Precautions/Restrictions: Intact Precaution/Restrictions Comments: verbally reviewed back precautions during functional tasks Restrictions Weight Bearing Restrictions Per Provider Order: No Other Position/Activity Restrictions: back precautions     Mobility  Bed Mobility Overal bed mobility: Needs Assistance             General bed mobility comments: pt up in chair on arrival and at end of session     Transfers Overall transfer level: Needs assistance Equipment used: Rolling walker (2 wheels) Transfers: Sit to/from Stand Sit to Stand: Contact guard assist           General transfer comment: pt standing from recliner and low commode with cues for hand placement and use of grab bar in bathroom, increased time and effortful rise but pt without need for assist    Ambulation/Gait Ambulation/Gait assistance: Contact guard assist, Min assist Gait Distance (Feet): 30 Feet (+ 15') Assistive device: Rolling walker (2 wheels) Gait Pattern/deviations: Decreased step length - right, Decreased step length - left, Decreased stride length Gait velocity: decr     General Gait Details: Pt ambulates around bed to door and to bathrrom with RW & CGA<>min assist to maintain balance. Pt requires cuing to stay within base of AD especially when turning   Stairs             Wheelchair Mobility     Tilt Bed    Modified Rankin (Stroke Patients Only)       Balance Overall balance assessment: Needs assistance Sitting-balance support: Feet supported, Single extremity supported, No upper extremity supported Sitting balance-Leahy Scale: Fair     Standing balance support: Bilateral upper extremity supported, During functional activity, Reliant on assistive device for balance Standing balance-Leahy Scale: Poor Standing balance comment: reliant on device and intermittent external assist                            Communication Communication Communication: No apparent difficulties  Cognition Arousal: Alert Behavior During Therapy: WFL for tasks assessed/performed, Anxious  Following commands: Intact      Cueing Cueing Techniques: Verbal cues  Exercises Other Exercises Other Exercises: Reviewed back precautions, pt able to recall 2/3 at beginning of session    General Comments General comments (skin integrity, edema, etc.):  assisted pt with donning R foot brace and shoe, LLE prosethsis donned on arrival, reviewed donning both shoe and prostehsis with adherence to back precautions      Pertinent Vitals/Pain Pain Assessment Pain Assessment: Faces Faces Pain Scale: Hurts even more Pain Location: back (R hip/sciatic nerve) Pain Descriptors / Indicators: Discomfort, Pounding, Grimacing, Guarding Pain Intervention(s): Monitored during session, Limited activity within patient's tolerance    Home Living Family/patient expects to be discharged to:: Private residence Living Arrangements: Other relatives;Children                      Prior Function            PT Goals (current goals can now be found in the care plan section) Acute Rehab PT Goals PT Goal Formulation: With patient Time For Goal Achievement: 02/21/24 Progress towards PT goals: Progressing toward goals    Frequency    Min 5X/week      PT Plan      Co-evaluation              AM-PAC PT 6 Clicks Mobility   Outcome Measure  Help needed turning from your back to your side while in a flat bed without using bedrails?: A Little Help needed moving from lying on your back to sitting on the side of a flat bed without using bedrails?: A Lot Help needed moving to and from a bed to a chair (including a wheelchair)?: A Little Help needed standing up from a chair using your arms (e.g., wheelchair or bedside chair)?: A Little Help needed to walk in hospital room?: A Little Help needed climbing 3-5 steps with a railing? : Total 6 Click Score: 15    End of Session   Activity Tolerance: Patient tolerated treatment well;Patient limited by fatigue;Patient limited by pain Patient left: in chair;with chair alarm set;with call bell/phone within reach Nurse Communication: Mobility status PT Visit Diagnosis: Pain;Muscle weakness (generalized) (M62.81);Other abnormalities of gait and mobility (R26.89);Difficulty in walking, not elsewhere  classified (R26.2);Unsteadiness on feet (R26.81)     Time: 8499-8476 PT Time Calculation (min) (ACUTE ONLY): 23 min  Charges:    $Gait Training: 8-22 mins $Therapeutic Activity: 8-22 mins PT General Charges $$ ACUTE PT VISIT: 1 Visit                     Zhanna Melin R. PTA Acute Rehabilitation Services Office: [phone]   Therisa CHRISTELLA Boor 02/11/2024, 4:01 PM

## 2024-02-11 NOTE — Progress Notes (Signed)
 Transferred pt to restroom in room, then to recliner. Chair alarm on. Call light in reach. All needs met as this time.

## 2024-02-11 NOTE — Progress Notes (Signed)
 Patient ID: Olivia Romero, female   DOB: 04-05-1956, 67 y.o.   MRN: [id number] BP 133/67 (BP Location: Left Arm)   Pulse 88   Temp 99 F (37.2 C) (Oral)   Resp 17   Ht 4' 11.5 (1.511 m)   Wt 82.6 kg   LMP  (LMP Unknown)   SpO2 93%   BMI 36.14 kg/m  Alert and oriented x 4, speech is clear and fluent Moving extremities well Awaiting placement.

## 2024-02-11 NOTE — Progress Notes (Signed)
 Occupational Therapy Treatment Patient Details Name: Olivia Romero MRN: [id number] DOB: 12-Mar-1956 Today's Date: 02/11/2024   History of present illness Pt is a 68 y/o F admitted on 02/06/24 for scheduled posterior lumbar interbody arthrodesis L3-L4, hardware removal L5, nonsegmental pedicle fixation L3-L4. PMH: anemia, asthma, dysphagia, GERD, HLD, HTN, IBS, neuropathy, stroke, DM2, L BKA   OT comments  Pt progressing toward established OT goals. Pt eager to work with OT to learn about self care within spinal precautions. Pt needing up to mod-max A for LB ADL this session due to inability to reach distal end of residual limb while donning LLE sleeve and prosthesis; pt educated regarding optimal positioning and technique. Challenging adherence to precautions with functional mobility and pericare as well with pt with good adherence after initial review of precautions; needing up to max A for pericare, and min A for functional mobility. Pt continues to be an excellent candidate for multidisciplinary intensive inpatient rehab >3 hours/day.       If plan is discharge home, recommend the following:  Two people to help with walking and/or transfers;A lot of help with bathing/dressing/bathroom;Assistance with cooking/housework;Assist for transportation;Help with stairs or ramp for entrance   Equipment Recommendations  None recommended by OT (pt has needed equipment; may benefit from drop arm BSC if not going to rehab)    Recommendations for Other Services      Precautions / Restrictions Precautions Precautions: Fall;Back Recall of Precautions/Restrictions: Intact Precaution/Restrictions Comments: verbally reviewed back precautions during functional tasks Restrictions Weight Bearing Restrictions Per Provider Order: No Other Position/Activity Restrictions: back precautions       Mobility Bed Mobility Overal bed mobility: Needs Assistance             General bed mobility comments: when  OT going to get linnen and AE for education, RN helped pt up to EOB due to report of needing to use the restroom    Transfers Overall transfer level: Needs assistance Equipment used: Rolling walker (2 wheels) Transfers: Sit to/from Stand Sit to Stand: Contact guard assist           General transfer comment: sit>stand from EOB with cuing re: hand placement     Balance Overall balance assessment: Needs assistance Sitting-balance support: Feet supported, Single extremity supported, No upper extremity supported Sitting balance-Leahy Scale: Fair     Standing balance support: Bilateral upper extremity supported, During functional activity, Reliant on assistive device for balance Standing balance-Leahy Scale: Poor Standing balance comment: reliant on device and intermittent external assist                           ADL either performed or assessed with clinical judgement   ADL Overall ADL's : Needs assistance/impaired     Grooming: Minimal assistance;Standing;Wash/dry hands Grooming Details (indicate cue type and reason): with UE support on sink             Lower Body Dressing: Moderate assistance;Maximal assistance;With adaptive equipment;Sitting/lateral leans;Sit to/from stand Lower Body Dressing Details (indicate cue type and reason): pt with urinary urgency, and difficulty reaching distal residual limb within precautions to don sleeve; assisted due to urgency; after return from restroom provided education regarding positioning at EOB to better reach residual distal limb. pt verbalized understanding. introduced AE for doffing R sock and donning R sock. pt with good functional implementation Toilet Transfer: Minimal assistance;Ambulation;Rolling walker (2 wheels)   Toileting- Clothing Manipulation and Hygiene: Maximal assistance;Sit to/from stand;Cueing for compensatory techniques  Toileting - Clothing Manipulation Details (indicate cue type and reason): adhering to  spinal precautions     Functional mobility during ADLs: Minimal assistance;+2 for physical assistance;+2 for safety/equipment;Rolling walker (2 wheels) (LLE prosthesis donned)      Extremity/Trunk Assessment Upper Extremity Assessment Upper Extremity Assessment: Right hand dominant;Generalized weakness;RUE deficits/detail;LUE deficits/detail (decr dexterity due to neuropathy) RUE Deficits / Details: gross strength 4/5; hx of peripheral neuropathy; AROM and coordination WFL RUE Sensation: history of peripheral neuropathy RUE Coordination: WNL LUE Deficits / Details: gross strength 4- to 4/5; hx of peripheral neuropathy; hx of shoulder fx with mildly decreased internal/external shoulder rotation at baseline but with AROM largely [facility] and all other AROM WFL; coordination WFL LUE Sensation: history of peripheral neuropathy   Lower Extremity Assessment Lower Extremity Assessment: Defer to PT evaluation        Vision   Vision Assessment?: No apparent visual deficits (with glasses)   Perception     Praxis     Communication Communication Communication: No apparent difficulties   Cognition Arousal: Alert Behavior During Therapy: WFL for tasks assessed/performed, Anxious Cognition: No apparent impairments             OT - Cognition Comments: pleasant and able to recall precautions, cues for functional implementation and problem solving                 Following commands: Intact        Cueing   Cueing Techniques: Verbal cues  Exercises      Shoulder Instructions       General Comments      Pertinent Vitals/ Pain       Pain Assessment Pain Assessment: Faces Faces Pain Scale: Hurts even more Pain Location: back (R hip) Pain Descriptors / Indicators: Discomfort, Pounding, Grimacing, Guarding Pain Intervention(s): Limited activity within patient's tolerance, Monitored during session  Home Living                                           Prior Functioning/Environment              Frequency  Min 2X/week        Progress Toward Goals  OT Goals(current goals can now be found in the care plan section)  Progress towards OT goals: Progressing toward goals  Acute Rehab OT Goals Patient Stated Goal: go to rehab/learn to do LB ADL without A OT Goal Formulation: With patient Time For Goal Achievement: 02/21/24 Potential to Achieve Goals: Good  Plan      Co-evaluation                 AM-PAC OT 6 Clicks Daily Activity     Outcome Measure   Help from another person eating meals?: A Little Help from another person taking care of personal grooming?: A Little Help from another person toileting, which includes using toliet, bedpan, or urinal?: A Lot Help from another person bathing (including washing, rinsing, drying)?: A Lot Help from another person to put on and taking off regular upper body clothing?: A Little Help from another person to put on and taking off regular lower body clothing?: A Lot 6 Click Score: 15    End of Session Equipment Utilized During Treatment: Gait belt;Rolling walker (2 wheels) (LLE prosthesis)  OT Visit Diagnosis: Unsteadiness on feet (R26.81);Other abnormalities of gait and mobility (R26.89);Pain;Other (comment) (decr activity tolerance)  Activity Tolerance Patient tolerated treatment well   Patient Left in chair;with call bell/phone within reach;with chair alarm set   Nurse Communication Mobility status;Other (comment)        Time: 9190-9148 OT Time Calculation (min): 42 min  Charges: OT General Charges $OT Visit: 1 Visit OT Treatments $Self Care/Home Management : 38-52 mins  Elma JONETTA Lebron FREDERICK, OTR/L [facility] Acute Rehabilitation Office: [phone]   Elma JONETTA Lebron 02/11/2024, 9:19 AM

## 2024-02-11 NOTE — Care Management Important Message (Signed)
 Important Message  Patient Details  Name: Olivia Romero MRN: [id number] Date of Birth: 22-Aug-1955   Important Message Given:  Yes - Medicare IM     Jon Cruel 02/11/2024, 1:02 PM

## 2024-02-11 NOTE — Plan of Care (Signed)
  Problem: Education: Goal: Knowledge of General Education information will improve Description: Including pain rating scale, medication(s)/side effects and non-pharmacologic comfort measures Outcome: Progressing   Problem: Health Behavior/Discharge Planning: Goal: Ability to manage health-related needs will improve Outcome: Progressing   Problem: Activity: Goal: Risk for activity intolerance will decrease Outcome: Progressing   Problem: Nutrition: Goal: Adequate nutrition will be maintained Outcome: Progressing   Problem: Pain Management: Goal: Pain level will decrease Outcome: Progressing

## 2024-02-11 NOTE — Progress Notes (Addendum)
 Inpatient Rehab Admissions Coordinator:  Saw pt at bedside. Provided update that awaiting updated therapy notes prior to beginning insurance authorization.  Later pt called AC to confirm that she spoke with Tasha, son's significant other, and they are in agreement with providing support for pt after discharge. Will continue to follow.   Insurance authorization started.    Tinnie Yvone Cohens, MS, CCC-SLP Admissions Coordinator [phone]

## 2024-02-12 LAB — GLUCOSE, CAPILLARY
Glucose-Capillary: 222 mg/dL — ABNORMAL HIGH (ref 70–99)
Glucose-Capillary: 245 mg/dL — ABNORMAL HIGH (ref 70–99)

## 2024-02-12 NOTE — Progress Notes (Signed)
Inpatient Rehab Admissions Coordinator:  Awaiting insurance authorization. Will continue to follow.   Gayland Curry, [place], [place] Admissions Coordinator [phone]

## 2024-02-12 NOTE — Progress Notes (Signed)
 Physical Therapy Treatment Patient Details Name: Olivia Romero MRN: [id number] DOB: 03-May-1956 Today's Date: 02/12/2024   History of Present Illness Pt is a 68 y/o F admitted on 02/06/24 for scheduled posterior lumbar interbody arthrodesis L3-L4, hardware removal L5, nonsegmental pedicle fixation L3-L4. PMH: anemia, asthma, dysphagia, GERD, HLD, HTN, IBS, neuropathy, stroke, DM2, L BKA    PT Comments  Pt up in chair on arrival, pleasant and agreeable to session. Pt making slow but steady progress towards acute goals, however continues to be limited by pain with ambulation and significantly decreased activity tolerance. Pt able to rise to stand with increased time and CGA for safety and progressing gait distance slightly with RW for support and up to min A to steady with turning. Encouraged continued frequent mobilization up to bathroom throughout day with pt verbalizing understanding. Pt continues to benefit from skilled PT services to progress toward functional mobility goals.     If plan is discharge home, recommend the following: A little help with walking and/or transfers;A little help with bathing/dressing/bathroom;Assistance with cooking/housework;Assist for transportation;Help with stairs or ramp for entrance   Can travel by private vehicle        Equipment Recommendations  BSC/3in1    Recommendations for Other Services       Precautions / Restrictions Precautions Precautions: Fall;Back Recall of Precautions/Restrictions: Intact Precaution/Restrictions Comments: verbally reviewed back precautions during functional tasks Required Braces or Orthoses:  (no brace required) Restrictions Weight Bearing Restrictions Per Provider Order: No Other Position/Activity Restrictions: back precautions     Mobility  Bed Mobility Overal bed mobility: Needs Assistance             General bed mobility comments: pt up in chair on arrival and at end of session    Transfers Overall  transfer level: Needs assistance Equipment used: Rolling walker (2 wheels) Transfers: Sit to/from Stand Sit to Stand: Contact guard assist           General transfer comment: pt standing from recliner and low commode with cues for hand placement and use of grab bar in bathroom, increased time and effortful rise but pt without need for assist    Ambulation/Gait Ambulation/Gait assistance: Contact guard assist, Min assist Gait Distance (Feet): 15 Feet (+ 40) Assistive device: Rolling walker (2 wheels) Gait Pattern/deviations: Decreased step length - right, Decreased step length - left, Decreased stride length Gait velocity: decr     General Gait Details: Pt requires cuing to stay within base of AD especially when turning, slow effortful steps with L knee flexed throughout, heavy reliance on RW support, cues to bring shoulders down away from ears   Stairs             Wheelchair Mobility     Tilt Bed    Modified Rankin (Stroke Patients Only)       Balance Overall balance assessment: Needs assistance Sitting-balance support: Feet supported, Single extremity supported, No upper extremity supported Sitting balance-Leahy Scale: Fair     Standing balance support: Bilateral upper extremity supported, During functional activity, Reliant on assistive device for balance Standing balance-Leahy Scale: Poor Standing balance comment: reliant on device and intermittent external assist                            Communication Communication Communication: No apparent difficulties  Cognition Arousal: Alert Behavior During Therapy: WFL for tasks assessed/performed, Anxious   PT - Cognitive impairments: No apparent impairments  Following commands: Intact      Cueing Cueing Techniques: Verbal cues  Exercises Other Exercises Other Exercises: Reviewed back precautions, pt able to recall 2/3 at beginning of session Other Exercises:  educated on IS use with pt stating she has used x1 throughout day, encouraged x10/hr    General Comments General comments (skin integrity, edema, etc.): assisted pt with donning R foot brace and shoe, LLE prosethsis donned on arrival, reviewed donning both shoe and prostehsis with adherence to back precautions      Pertinent Vitals/Pain Pain Assessment Pain Assessment: Faces Faces Pain Scale: Hurts little more Pain Location: back (R hip/sciatic nerve) Pain Descriptors / Indicators: Discomfort, Pounding, Grimacing, Guarding Pain Intervention(s): Monitored during session, Limited activity within patient's tolerance    Home Living                          Prior Function            PT Goals (current goals can now be found in the care plan section) Acute Rehab PT Goals Patient Stated Goal: get better, go to CIR PT Goal Formulation: With patient Time For Goal Achievement: 02/21/24 Progress towards PT goals: Progressing toward goals    Frequency    Min 5X/week      PT Plan      Co-evaluation              AM-PAC PT 6 Clicks Mobility   Outcome Measure  Help needed turning from your back to your side while in a flat bed without using bedrails?: A Little Help needed moving from lying on your back to sitting on the side of a flat bed without using bedrails?: A Lot Help needed moving to and from a bed to a chair (including a wheelchair)?: A Little Help needed standing up from a chair using your arms (e.g., wheelchair or bedside chair)?: A Little Help needed to walk in hospital room?: A Little Help needed climbing 3-5 steps with a railing? : Total 6 Click Score: 15    End of Session   Activity Tolerance: Patient tolerated treatment well;Patient limited by fatigue;Patient limited by pain Patient left: in chair;with call bell/phone within reach;with family/visitor present Nurse Communication: Mobility status PT Visit Diagnosis: Pain;Muscle weakness  (generalized) (M62.81);Other abnormalities of gait and mobility (R26.89);Difficulty in walking, not elsewhere classified (R26.2);Unsteadiness on feet (R26.81) Pain - part of body:  (back)     Time: 1520-1540 PT Time Calculation (min) (ACUTE ONLY): 20 min  Charges:    $Gait Training: 23-37 mins PT General Charges $$ ACUTE PT VISIT: 1 Visit                     Jaeshaun Riva R. PTA Acute Rehabilitation Services Office: [phone]   Therisa CHRISTELLA Boor 02/12/2024, 4:28 PM

## 2024-02-12 NOTE — Progress Notes (Signed)
 Assisted pt to restroom and then assisted pt into recliner in room. Chair alarm on. All needs met at this time

## 2024-02-12 NOTE — Progress Notes (Signed)
 Patient ID: Olivia Romero, female   DOB: [birth year], 68 y.o.   MRN: [id number] BP (!) 103/50 (BP Location: Left Arm)   Pulse 84   Temp 98 F (36.7 C) (Oral)   Resp 16   Ht 4' 11.5 (1.511 m)   Wt 80.1 kg   LMP  (LMP Unknown)   SpO2 100%   BMI 35.07 kg/m  Alert and oriented x4 Pain in left thigh, on the bone. Area is tender to touch. No bruising Moving lower extremities Will order xray femur for tomorrow.

## 2024-02-13 ENCOUNTER — Inpatient Hospital Stay (HOSPITAL_COMMUNITY)

## 2024-02-13 DIAGNOSIS — M1611 Unilateral primary osteoarthritis, right hip: Secondary | ICD-10-CM | POA: Diagnosis not present

## 2024-02-13 DIAGNOSIS — M25551 Pain in right hip: Secondary | ICD-10-CM | POA: Diagnosis not present

## 2024-02-13 DIAGNOSIS — M1711 Unilateral primary osteoarthritis, right knee: Secondary | ICD-10-CM | POA: Diagnosis not present

## 2024-02-13 LAB — GLUCOSE, CAPILLARY
Glucose-Capillary: 316 mg/dL — ABNORMAL HIGH (ref 70–99)
Glucose-Capillary: 199 mg/dL — ABNORMAL HIGH (ref 70–99)

## 2024-02-13 NOTE — Progress Notes (Signed)
 Physical Therapy Treatment Patient Details Name: Olivia Romero MRN: [id number] DOB: 02/06/1956 Today's Date: 02/13/2024   History of Present Illness Pt is a 68 y/o F admitted on 02/06/24 for scheduled posterior lumbar interbody arthrodesis L3-L4, hardware removal L5, nonsegmental pedicle fixation L3-L4. PMH: anemia, asthma, dysphagia, GERD, HLD, HTN, IBS, neuropathy, stroke, DM2, L BKA    PT Comments  Pt resting in bed on arrival, pleasant and agreeable to session. Pt continues to be limited in safe mobility by decreased activity tolerance, poor balance/postural reactions, weakness and R LE pain. Pt demonstrating steady progress towards acute goals, progressing gait distance with increased cadence with grossly CGA for safety. Pt continues to demonstrate effortful steps with heavy reliance on RW support. Pt up in chair at end of session and verbalizing understanding of continued education on importance of frequent mobilization. Pt continues to benefit from skilled PT services to progress toward functional mobility goals.     If plan is discharge home, recommend the following: A little help with walking and/or transfers;A little help with bathing/dressing/bathroom;Assistance with cooking/housework;Assist for transportation;Help with stairs or ramp for entrance   Can travel by private vehicle        Equipment Recommendations  BSC/3in1    Recommendations for Other Services       Precautions / Restrictions Precautions Precautions: Fall;Back Recall of Precautions/Restrictions: Intact Precaution/Restrictions Comments: verbally reviewed back precautions during functional tasks Required Braces or Orthoses:  (no brace required) Restrictions Weight Bearing Restrictions Per Provider Order: No Other Position/Activity Restrictions: back precautions     Mobility  Bed Mobility Overal bed mobility: Needs Assistance Bed Mobility: Rolling, Sidelying to Sit Rolling: Supervision, Used  rails Sidelying to sit: Min assist, HOB elevated, Used rails       General bed mobility comments: min A to elevate trunk to sitting    Transfers Overall transfer level: Needs assistance Equipment used: Rolling walker (2 wheels) Transfers: Sit to/from Stand Sit to Stand: Contact guard assist           General transfer comment: pt standing from EOB and low commode with cues for hand placement and use of grab bar in bathroom, increased time and effortful rise but pt without need for assist    Ambulation/Gait Ambulation/Gait assistance: Contact guard assist, Min assist Gait Distance (Feet): 15 Feet (+ 60') Assistive device: Rolling walker (2 wheels) Gait Pattern/deviations: Decreased step length - right, Decreased step length - left, Decreased stride length Gait velocity: decr     General Gait Details: Pt requires cuing to stay within base of AD especially when turning, slow effortful steps with L knee flexed throughout, slightly improved gait speed this session   Stairs             Wheelchair Mobility     Tilt Bed    Modified Rankin (Stroke Patients Only)       Balance Overall balance assessment: Needs assistance Sitting-balance support: Feet supported, Single extremity supported, No upper extremity supported Sitting balance-Leahy Scale: Fair     Standing balance support: Bilateral upper extremity supported, During functional activity, Reliant on assistive device for balance Standing balance-Leahy Scale: Poor Standing balance comment: reliant on device and intermittent external assist                            Communication Communication Communication: No apparent difficulties  Cognition Arousal: Alert Behavior During Therapy: WFL for tasks assessed/performed, Anxious   PT - Cognitive impairments: No apparent  impairments                         Following commands: Intact      Cueing Cueing Techniques: Verbal cues  Exercises       General Comments General comments (skin integrity, edema, etc.): assisted pt with donning R foot brace and shoe and LLE prosethsis, reviewed donning both shoe and prostehsis with adherence to back precautions      Pertinent Vitals/Pain Pain Assessment Pain Assessment: Faces Faces Pain Scale: Hurts little more Pain Location: back (R hip/sciatic nerve) Pain Descriptors / Indicators: Discomfort, Pounding, Grimacing, Guarding Pain Intervention(s): Monitored during session, Limited activity within patient's tolerance, Repositioned    Home Living                          Prior Function            PT Goals (current goals can now be found in the care plan section) Acute Rehab PT Goals Patient Stated Goal: get better, go to CIR PT Goal Formulation: With patient Time For Goal Achievement: 02/21/24 Progress towards PT goals: Progressing toward goals    Frequency    Min 5X/week      PT Plan      Co-evaluation              AM-PAC PT 6 Clicks Mobility   Outcome Measure  Help needed turning from your back to your side while in a flat bed without using bedrails?: A Little Help needed moving from lying on your back to sitting on the side of a flat bed without using bedrails?: A Lot Help needed moving to and from a bed to a chair (including a wheelchair)?: A Little Help needed standing up from a chair using your arms (e.g., wheelchair or bedside chair)?: A Little Help needed to walk in hospital room?: A Little Help needed climbing 3-5 steps with a railing? : Total 6 Click Score: 15    End of Session   Activity Tolerance: Patient tolerated treatment well;Patient limited by fatigue;Patient limited by pain Patient left: in chair;with call bell/phone within reach Nurse Communication: Mobility status PT Visit Diagnosis: Pain;Muscle weakness (generalized) (M62.81);Other abnormalities of gait and mobility (R26.89);Difficulty in walking, not elsewhere classified  (R26.2);Unsteadiness on feet (R26.81) Pain - part of body:  (back)     Time: 8546-8486 PT Time Calculation (min) (ACUTE ONLY): 20 min  Charges:    $Gait Training: 23-37 mins PT General Charges $$ ACUTE PT VISIT: 1 Visit                     Tiandre Teall R. PTA Acute Rehabilitation Services Office: [phone]   Therisa CHRISTELLA Boor 02/13/2024, 4:18 PM

## 2024-02-13 NOTE — Progress Notes (Signed)
 Pt inquired about xray. Let her know: EXAM: 2 VIEW(S) XRAY OF THE RIGHT FEMUR 02/13/2024 08:20:00 AM   COMPARISON: Hip radiograph 12/03/2023   CLINICAL HISTORY: Pain in right femur   FINDINGS:   BONES AND JOINTS: Osseous overgrowth of the right acetabulum with right hip joint space narrowing compatible with moderate degenerative changes. Osteophytes of the right iliac crest and greater trochanter. Degenerative changes of the right knee with marginal osteophytosis and medial femorotibial predominant joint space narrowing. Indeterminate deformity of the proximal right fibula. No acute fracture.   SOFT TISSUES: The soft tissues are unremarkable.   IMPRESSION: 1. Moderate degenerative changes in the right hip and right knee. 2. Age-indeterminate deformity of the proximal right fibula.

## 2024-02-13 NOTE — Progress Notes (Signed)
 Occupational Therapy Treatment Patient Details Name: Olivia Romero MRN: [id number] DOB: Sep 22, 1955 Today's Date: 02/13/2024   History of present illness Pt is a 68 y/o F admitted on 02/06/24 for scheduled posterior lumbar interbody arthrodesis L3-L4, hardware removal L5, nonsegmental pedicle fixation L3-L4. PMH: anemia, asthma, dysphagia, GERD, HLD, HTN, IBS, neuropathy, stroke, DM2, L BKA   OT comments  Pt motivated to learn about LB ADL within spinal precautions. Pt needing min indirect cues and significant time to recall precautions, although even prior to this, able to self identify she was not within precautions when reaching across her body with truncal rotation for her phone with NT in room. Pt with LLE prosthesis and R ankle brace she wears; provided education and practice for AE with cues for problem solving and pt needing min A for optimization of technique and appropriately securing velcro on shoes and brace. Educated for optimal positioning as well during tasks. Pt remains an excellent rehab candidate for intensive multidisciplinary therapies >3 hours/day.       If plan is discharge home, recommend the following:  Two people to help with walking and/or transfers;A lot of help with bathing/dressing/bathroom;Assistance with cooking/housework;Assist for transportation;Help with stairs or ramp for entrance   Equipment Recommendations  None recommended by OT    Recommendations for Other Services Rehab consult    Precautions / Restrictions Precautions Precautions: Fall;Back Recall of Precautions/Restrictions: Intact Precaution/Restrictions Comments: verbally reviewed back precautions during functional tasks Required Braces or Orthoses:  (no brace required) Restrictions Weight Bearing Restrictions Per Provider Order: No Other Position/Activity Restrictions: back precautions       Mobility Bed Mobility               General bed mobility comments: OOB in recliner chair  with BLE down    Transfers Overall transfer level: Needs assistance Equipment used: Rolling walker (2 wheels) Transfers: Sit to/from Stand Sit to Stand: Contact guard assist           General transfer comment: cues for hand placenent     Balance Overall balance assessment: Needs assistance Sitting-balance support: Feet supported, Single extremity supported, No upper extremity supported Sitting balance-Leahy Scale: Fair     Standing balance support: Bilateral upper extremity supported, During functional activity, Reliant on assistive device for balance Standing balance-Leahy Scale: Poor Standing balance comment: reliant on device and intermittent external assist                           ADL either performed or assessed with clinical judgement   ADL Overall ADL's : Needs assistance/impaired                     Lower Body Dressing: Minimal assistance;Sitting/lateral leans;Sit to/from stand Lower Body Dressing Details (indicate cue type and reason): dense education provided for LB ADL within precautions with donning socks, shoes, prosthesis. Toilet Transfer: Minimal assistance;Rolling walker (2 wheels)                  Extremity/Trunk Assessment Upper Extremity Assessment Upper Extremity Assessment: Right hand dominant;RUE deficits/detail;LUE deficits/detail RUE Deficits / Details: gross strength 4/5; hx of peripheral neuropathy; AROM and coordination WFL LUE Deficits / Details: gross strength 4- to 4/5; hx of peripheral neuropathy; hx of shoulder fx with mildly decreased internal/external shoulder rotation at baseline but with AROM largely [facility] and all other AROM WFL; coordination [facility]   Lower Extremity Assessment Lower Extremity Assessment: Defer to PT evaluation  Vision   Vision Assessment?: No apparent visual deficits (with glasses donned; some decr contrast sensitivity)   Perception     Praxis     Communication  Communication Communication: No apparent difficulties   Cognition Arousal: Alert Behavior During Therapy: WFL for tasks assessed/performed, Anxious Cognition: No apparent impairments                               Following commands: Intact        Cueing   Cueing Techniques: Verbal cues  Exercises      Shoulder Instructions       General Comments assisted pt with donning R foot brace and shoe and LLE prosethsis, reviewed donning both shoe and prostehsis with adherence to back precautions    Pertinent Vitals/ Pain       Pain Assessment Pain Assessment: Faces Faces Pain Scale: Hurts little more Pain Location: back (R hip/sciatic nerve) Pain Descriptors / Indicators: Discomfort, Pounding, Grimacing, Guarding Pain Intervention(s): Limited activity within patient's tolerance, Monitored during session  Home Living                                          Prior Functioning/Environment              Frequency  Min 2X/week        Progress Toward Goals  OT Goals(current goals can now be found in the care plan section)  Progress towards OT goals: Progressing toward goals  Acute Rehab OT Goals Patient Stated Goal: go to rehab/get independent with LB ADL OT Goal Formulation: With patient Time For Goal Achievement: 02/21/24 Potential to Achieve Goals: Good  Plan      Co-evaluation                 AM-PAC OT 6 Clicks Daily Activity     Outcome Measure   Help from another person eating meals?: A Little Help from another person taking care of personal grooming?: A Little Help from another person toileting, which includes using toliet, bedpan, or urinal?: A Lot Help from another person bathing (including washing, rinsing, drying)?: A Lot Help from another person to put on and taking off regular upper body clothing?: A Little Help from another person to put on and taking off regular lower body clothing?: A Little 6 Click Score:  16    End of Session Equipment Utilized During Treatment: Gait belt;Rolling walker (2 wheels) (L LE prosthesis)  OT Visit Diagnosis: Unsteadiness on feet (R26.81);Other abnormalities of gait and mobility (R26.89);Pain;Other (comment)   Activity Tolerance Patient tolerated treatment well   Patient Left in chair;with call bell/phone within reach;with chair alarm set   Nurse Communication Mobility status;Other (comment)        Time: 8453-8379 OT Time Calculation (min): 34 min  Charges: OT General Charges $OT Visit: 1 Visit OT Treatments $Self Care/Home Management : 23-37 mins  Elma JONETTA Lebron FREDERICK, OTR/L [facility] Acute Rehabilitation Office: [phone]   Elma JONETTA Lebron 02/13/2024, 5:27 PM

## 2024-02-13 NOTE — Progress Notes (Signed)
 Patient ID: Olivia Romero, female   DOB: 11/09/1955, 68 y.o.   MRN: [id number] BP (!) 141/62 (BP Location: Left Arm)   Pulse 95   Temp 99.3 F (37.4 C) (Oral)   Resp 18   Ht 4' 11.5 (1.511 m)   Wt 80.1 kg   LMP  (LMP Unknown)   SpO2 95%   BMI 35.07 kg/m  Alert and oriented x 4, speech is clear and fluent Xray lower extremity shows nothing acute. No explanation for tenderness Remains a candidate for rehab.

## 2024-02-14 LAB — GLUCOSE, CAPILLARY
Glucose-Capillary: 217 mg/dL — ABNORMAL HIGH (ref 70–99)
Glucose-Capillary: 247 mg/dL — ABNORMAL HIGH (ref 70–99)

## 2024-02-14 NOTE — Progress Notes (Signed)
 Mobility Specialist Progress Note:    02/14/24 1114  Mobility  Activity Ambulated with assistance (In room/ hallway)  Level of Assistance Contact guard assist, steadying assist  Assistive Device Front wheel walker  Distance Ambulated (ft) 70 ft  Activity Response Tolerated well  Mobility Referral Yes  Mobility visit 1 Mobility  Mobility Specialist Start Time (ACUTE ONLY) 1024  Mobility Specialist Stop Time (ACUTE ONLY) 1055  Mobility Specialist Time Calculation (min) (ACUTE ONLY) 31 min   Received pt in bed requesting to use the BSC. Assisted pt with pericare. No physical assistance required. Agreeable to further mobility. Pt returned to room without fault. Left in chair with alarm on. Personal belongings and call light within reach. All needs met.  Lavanda Pollack Mobility Specialist  Please contact via [employer] or  Rehab Office [phone]

## 2024-02-14 NOTE — Progress Notes (Signed)
 Patient ID: Olivia Romero, female   DOB: [birth year], 68 y.o.   MRN: [id number] BP (!) 118/44 (BP Location: Left Arm)   Pulse 86   Temp 98 F (36.7 C) (Oral)   Resp 16   Ht 4' 11.5 (1.511 m)   Wt 80.1 kg   LMP  (LMP Unknown)   SpO2 99%   BMI 35.07 kg/m  Alert and oriented. Pain in thigh has improved.  Will not be able to stay for CIR, but placement will occur.  Improved, and in less pain.

## 2024-02-14 NOTE — Progress Notes (Signed)
 IP rehab admissions - We have received a denial from [employer] insurance carrier for acute inpatient rehab admission.  I spoke with patient and she does not feel that she can DC home alone.  She is open to SNF placement.  Recommend TOC pursue SNF placement.  We will not appeal this insurance decision.  [phone]

## 2024-02-14 NOTE — Progress Notes (Signed)
 Physical Therapy Treatment Patient Details Name: Olivia Romero MRN: [id number] DOB: 1956/04/21 Today's Date: 02/14/2024   History of Present Illness Pt is a 68 y/o F admitted on 02/06/24 for scheduled posterior lumbar interbody arthrodesis L3-L4, hardware removal L5, nonsegmental pedicle fixation L3-L4. PMH: anemia, asthma, dysphagia, GERD, HLD, HTN, IBS, neuropathy, stroke, DM2, L BKA    PT Comments  Pt greeted up in chair on arrival, agreeable to session, however limited due to increased fatigue and pain. Pt demonstrating gait with RW for support on initial gait bout with CGA for safety able to progress gait with personal crutches on second bout with up to min A to maintain balance. Pt endorsing increased pain/pressure in back with use of crutches. Pt needing max cues for log roll technique and min A to complete on return to supine at end of session. Pt continues to require cues throughout mobility for adherence to all precautions. Pt continues to benefit from skilled PT services to progress toward functional mobility goals.     If plan is discharge home, recommend the following: A little help with walking and/or transfers;A little help with bathing/dressing/bathroom;Assistance with cooking/housework;Assist for transportation;Help with stairs or ramp for entrance   Can travel by private vehicle        Equipment Recommendations  BSC/3in1    Recommendations for Other Services       Precautions / Restrictions Precautions Precautions: Fall;Back Recall of Precautions/Restrictions: Intact Precaution/Restrictions Comments: verbally reviewed back precautions during functional tasks Required Braces or Orthoses:  (no brace required) Restrictions Weight Bearing Restrictions Per Provider Order: No Other Position/Activity Restrictions: back precautions     Mobility  Bed Mobility Overal bed mobility: Needs Assistance Bed Mobility: Rolling, Sit to Sidelying Rolling: Supervision       Sit  to sidelying: Min assist General bed mobility comments: min A with max cues to complete log roll to return to supine    Transfers Overall transfer level: Needs assistance Equipment used: Rolling walker (2 wheels), [place] Transfers: Sit to/from Stand Sit to Stand: Contact guard assist, Min assist           General transfer comment: cues for hand placement, min A to stand from low commode to personal crutches    Ambulation/Gait Ambulation/Gait assistance: Contact guard assist, Min assist Gait Distance (Feet): 15 Feet (x2) Assistive device: Rolling walker (2 wheels), [place] Gait Pattern/deviations: Decreased step length - right, Decreased step length - left, Decreased stride length Gait velocity: decr     General Gait Details: min A to maintain balance with use of crutches on second bout of ambulation, pt endorsing increased back pain/pressure with use of crutches   Stairs             Wheelchair Mobility     Tilt Bed    Modified Rankin (Stroke Patients Only)       Balance Overall balance assessment: Needs assistance Sitting-balance support: Feet supported, Single extremity supported, No upper extremity supported Sitting balance-Leahy Scale: Fair     Standing balance support: Bilateral upper extremity supported, During functional activity, Reliant on assistive device for balance Standing balance-Leahy Scale: Poor Standing balance comment: reliant on device and intermittent external assist                            Communication Communication Communication: No apparent difficulties  Cognition Arousal: Alert Behavior During Therapy: WFL for tasks assessed/performed, Anxious   PT - Cognitive impairments: No apparent impairments  Following commands: Intact      Cueing Cueing Techniques: Verbal cues  Exercises Other Exercises Other Exercises: Reviewed back precautions, pt able to recall 2/3 at  beginning of session    General Comments        Pertinent Vitals/Pain Pain Assessment Pain Assessment: Faces Faces Pain Scale: Hurts little more Pain Location: back (R hip/sciatic nerve) Pain Descriptors / Indicators: Discomfort, Pounding, Grimacing, Guarding Pain Intervention(s): Monitored during session, Limited activity within patient's tolerance, Repositioned    Home Living                          Prior Function            PT Goals (current goals can now be found in the care plan section) Acute Rehab PT Goals Patient Stated Goal: get better, go to CIR PT Goal Formulation: With patient Time For Goal Achievement: 02/21/24 Progress towards PT goals: Progressing toward goals    Frequency    Min 5X/week      PT Plan      Co-evaluation              AM-PAC PT 6 Clicks Mobility   Outcome Measure  Help needed turning from your back to your side while in a flat bed without using bedrails?: A Little Help needed moving from lying on your back to sitting on the side of a flat bed without using bedrails?: A Lot Help needed moving to and from a bed to a chair (including a wheelchair)?: A Little Help needed standing up from a chair using your arms (e.g., wheelchair or bedside chair)?: A Little Help needed to walk in hospital room?: A Little Help needed climbing 3-5 steps with a railing? : Total 6 Click Score: 15    End of Session   Activity Tolerance: Patient tolerated treatment well;Patient limited by fatigue;Patient limited by pain Patient left: with call bell/phone within reach;in bed Nurse Communication: Mobility status PT Visit Diagnosis: Pain;Muscle weakness (generalized) (M62.81);Other abnormalities of gait and mobility (R26.89);Difficulty in walking, not elsewhere classified (R26.2);Unsteadiness on feet (R26.81) Pain - part of body:  (back)     Time: 8447-8395 PT Time Calculation (min) (ACUTE ONLY): 12 min  Charges:    $Gait Training:  8-22 mins PT General Charges $$ ACUTE PT VISIT: 1 Visit                     Karia Ehresman R. PTA Acute Rehabilitation Services Office: [phone]   Therisa CHRISTELLA Boor 02/14/2024, 4:29 PM

## 2024-02-14 NOTE — Plan of Care (Signed)

## 2024-02-15 LAB — GLUCOSE, CAPILLARY
Glucose-Capillary: 264 mg/dL — ABNORMAL HIGH (ref 70–99)
Glucose-Capillary: 208 mg/dL — ABNORMAL HIGH (ref 70–99)
Glucose-Capillary: 168 mg/dL — ABNORMAL HIGH (ref 70–99)
Glucose-Capillary: 260 mg/dL — ABNORMAL HIGH (ref 70–99)

## 2024-02-15 NOTE — Progress Notes (Signed)
 Mobility Specialist Progress Note:    02/15/24 1600  Mobility  Activity Ambulated with assistance  Level of Assistance Contact guard assist, steadying assist  Assistive Device Front wheel walker  Distance Ambulated (ft) 20 ft (x2)  Activity Response Tolerated well  Mobility Referral Yes  Mobility visit 1 Mobility  Mobility Specialist Start Time (ACUTE ONLY) 1620  Mobility Specialist Stop Time (ACUTE ONLY) 1632  Mobility Specialist Time Calculation (min) (ACUTE ONLY) 12 min   Received pt in chair having no complaints and agreeable to mobility. Took x3 standing rest break d/t fatigue, otherwise tolerated well. Returned to room w/o fault. Left in chair w/ call bell in reach and all needs met.   Thersia Minder Mobility Specialist  Please contact vis [employer] or  Rehab Office [phone]

## 2024-02-15 NOTE — [place] FL2 (Signed)
 [place]  MEDICAID FL2 LEVEL OF CARE FORM     IDENTIFICATION  Patient Name: Olivia Romero Birthdate: [birth year] Sex: female Admission Date (Current Location): 02/06/2024  [facility] and [place] Number:  Producer, television/film/video and Address:  [facility], [address], [place], [place] [address]      Provider Number: [id number]  Attending Physician Name and Address:  Gillie Duncans, MD  Relative Name and Phone Number:  Verneda Deforest (Relative)  [phone]    Current Level of Care: Hospital Recommended Level of Care: Skilled Nursing Facility Prior Approval Number:    Date Approved/Denied: 02/15/24 PASRR Number: [id number]  Discharge Plan: SNF    Current Diagnoses: Patient Active Problem List   Diagnosis Date Noted   Lumbar adjacent segment disease with spondylolisthesis 02/06/2024   Aortic atherosclerosis (HCC) 01/15/2023   History of diastolic dysfunction 04/18/2022   Mitral valve insufficiency 04/18/2022   Statin myopathy 11/23/2021   History of colitis 08/03/2021   Vitamin D  deficiency 07/12/2020   Chronic low back pain 07/12/2020   Sepsis without acute organ dysfunction (HCC) 12/29/2019   Cervical disc disorder 12/29/2019   Brachial radiculitis 12/29/2019   Allergy to statin medication 12/01/2019   History of below knee amputation, left (HCC) 07/30/2019   PAD (peripheral artery disease) (HCC) 12/05/2018   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) 09/18/2018   Class 2 severe obesity with serious comorbidity and body mass index (BMI) of 35.0 to 35.9 in adult (HCC) 11/01/2017   B12 deficiency 11/01/2017   Diabetic polyneuropathy associated with type 2 diabetes mellitus (HCC) 12/13/2015   Lumbar stenosis with neurogenic claudication 09/02/2015   Chronic constipation 07/13/2015   OSA (obstructive sleep apnea) 06/25/2015   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/23/2015   Hyperlipidemia 04/23/2015   Statin intolerance  04/23/2015   Asthma, mild intermittent 02/01/2015   Type 2 diabetes mellitus with diabetic neuropathy, with long-term current use of insulin  (HCC) 12/31/2014   Gastroesophageal reflux disease with esophagitis 12/31/2014   Bilateral carpal tunnel syndrome 12/01/2014   Diverticulosis of colon 11/30/2012   Benign essential hypertension 11/28/2012    Orientation RESPIRATION BLADDER Height & Weight     Self, Time, Situation, Place  O2 (room air   2L/min) Incontinent, Indwelling catheter Weight: 176 lb 9.6 oz (80.1 kg) Height:  4' 11.5 (151.1 cm)  BEHAVIORAL SYMPTOMS/MOOD NEUROLOGICAL BOWEL NUTRITION STATUS      Continent Diet  AMBULATORY STATUS COMMUNICATION OF NEEDS Skin   Extensive Assist Verbally Surgical wounds (surgical incision closed)                       Personal Care Assistance Level of Assistance  Feeding, Bathing, Dressing Bathing Assistance: Limited assistance Feeding assistance: Limited assistance Dressing Assistance: Maximum assistance     Functional Limitations Info  Sight, Hearing, Speech Sight Info: Impaired Hearing Info: Adequate Speech Info: Adequate    SPECIAL CARE FACTORS FREQUENCY  PT (By licensed PT), OT (By licensed OT)     PT Frequency: 5x/week OT Frequency: 5x/week            Contractures Contractures Info: Not present    Additional Factors Info  Code Status, Allergies Code Status Info: Full Allergies Info: egg derived products, Atorvastatin, Tirzepatide , Influenza virus vaccine, latex, tape, pravastatin           Current Medications (02/15/2024):  This is the current hospital active medication list Current Facility-Administered Medications  Medication Dose Route Frequency Provider Last Rate  Last Admin   0.9 % NaCl with KCl 20 mEq/ L  infusion   Intravenous Continuous Cabbell, Kyle, MD   Stopped at 02/07/24 2203   acetaminophen  (TYLENOL ) tablet 650 mg  650 mg Oral Q4H PRN Gillie Duncans, MD       Or   acetaminophen  (TYLENOL )  suppository 650 mg  650 mg Rectal Q4H PRN Gillie Duncans, MD       albuterol  (PROVENTIL ) (2.5 MG/3ML) 0.083% nebulizer solution 2.5 mg  2.5 mg Nebulization Q4H PRN Gillie Duncans, MD       aspirin  EC tablet 325 mg  325 mg Oral Daily Cabbell, Kyle, MD   325 mg at 02/15/24 0901   diazepam  (VALIUM ) tablet 5 mg  5 mg Oral Q6H PRN Gillie Duncans, MD       DULoxetine  (CYMBALTA ) DR capsule 120 mg  120 mg Oral QHS Cabbell, Kyle, MD   120 mg at 02/14/24 2114   ezetimibe  (ZETIA ) tablet 10 mg  10 mg Oral Daily Cabbell, Kyle, MD   10 mg at 02/15/24 0901   fenofibrate  tablet 160 mg  160 mg Oral Daily Cabbell, Kyle, MD   160 mg at 02/15/24 0901   furosemide  (LASIX ) tablet 10-20 mg  10-20 mg Oral Daily PRN Gillie Duncans, MD       gabapentin  (NEURONTIN ) capsule 200 mg  200 mg Oral QHS Cabbell, Kyle, MD   200 mg at 02/14/24 2115   gabapentin  (NEURONTIN ) capsule 800 mg  800 mg Oral BID Cabbell, Kyle, MD   800 mg at 02/15/24 0901   hydrochlorothiazide  (HYDRODIURIL ) tablet 25 mg  25 mg Oral Daily Cabbell, Kyle, MD   25 mg at 02/15/24 0901   insulin  aspart (novoLOG ) injection 8 Units  8 Units Subcutaneous BH-qamhs Cabbell, Kyle, MD   8 Units at 02/15/24 0901   insulin  glargine (LANTUS ) injection 24 Units  24 Units Subcutaneous QHS Gillie Duncans, MD   24 Units at 02/14/24 2115   menthol -cetylpyridinium (CEPACOL) lozenge 3 mg  1 lozenge Oral PRN Cabbell, Kyle, MD       Or   phenol (CHLORASEPTIC) mouth spray 1 spray  1 spray Mouth/Throat PRN Cabbell, Kyle, MD       metFORMIN  (GLUCOPHAGE -XR) 24 hr tablet 500 mg  500 mg Oral TID AC Cabbell, Kyle, MD   500 mg at 02/15/24 1207   ondansetron  (ZOFRAN -ODT) disintegrating tablet 4 mg  4 mg Oral Q8H PRN Gillie Duncans, MD       oxyCODONE  (Oxy IR/ROXICODONE ) immediate release tablet 10 mg  10 mg Oral Q3H PRN Cabbell, Kyle, MD   10 mg at 02/15/24 0541   oxyCODONE  (Oxy IR/ROXICODONE ) immediate release tablet 5 mg  5 mg Oral Q3H PRN Gillie Duncans, MD   5 mg at 02/11/24 1012    pantoprazole  (PROTONIX ) EC tablet 40 mg  40 mg Oral Daily PRN Cabbell, Kyle, MD       polyethylene glycol (MIRALAX  / GLYCOLAX ) packet 17 g  17 g Oral Daily PRN Gillie Duncans, MD       potassium chloride  SA (KLOR-CON  M) CR tablet 20 mEq  20 mEq Oral Daily PRN Gillie Duncans, MD       prenatal vitamin w/FE, FA (NATACHEW) chewable tablet 1 tablet  1 tablet Oral Q1200 Cabbell, Kyle, MD   1 tablet at 02/15/24 1207   senna (SENOKOT) tablet 8.6 mg  1 tablet Oral BID Cabbell, Kyle, MD   8.6 mg at 02/15/24 0901   sodium chloride  flush (NS) 0.9 %  injection 3 mL  3 mL Intravenous Q12H Gillie Duncans, MD   3 mL at 02/14/24 2118   sodium chloride  flush (NS) 0.9 % injection 3 mL  3 mL Intravenous PRN Cabbell, Kyle, MD         Discharge Medications: Please see discharge summary for a list of discharge medications.  Relevant Imaging Results:  Relevant Lab Results:   Additional Information SSN: [id number]  Jeoffrey LITTIE Moose, LCSWA

## 2024-02-15 NOTE — TOC Initial Note (Signed)
 Transition of Care [facility]) - Initial/Assessment Note    Patient Details  Name: Olivia Romero MRN: [id number] Date of Birth: Dec 11, 1955  Transition of Care [facility]) CM/SW Contact:    Jeoffrey LITTIE Moose, [place] Phone Number: 02/15/2024, 1:09 PM  Clinical Narrative:                 Pt admitted from home due to significant adjacent segment disease. CSW completed SNF workup with pt. Pt agreeable to DC plan. CSW completed Fl2 and sent out SNF referrals. CSW will follow up and provide pt with bed offers.  Expected Discharge Plan: Skilled Nursing Facility Barriers to Discharge: English as a second language teacher, Continued Medical Work up, SNF Pending bed offer   Patient Goals and CMS Choice Patient states their goals for this hospitalization and ongoing recovery are:: SNF          Expected Discharge Plan and Services       Living arrangements for the past 2 months: Single Family Home                                      Prior Living Arrangements/Services Living arrangements for the past 2 months: Single Family Home Lives with:: Self Patient language and need for interpreter reviewed:: Yes Do you feel safe going back to the place where you live?: Yes      Need for Family Participation in Patient Care: No (Comment) Care giver support system in place?: Yes (comment)   Criminal Activity/Legal Involvement Pertinent to Current Situation/Hospitalization: No - Comment as needed  Activities of Daily Living   ADL Screening (condition at time of admission) Independently performs ADLs?: Yes (appropriate for developmental age) (Using prosthesis) Is the patient deaf or have difficulty hearing?: No Does the patient have difficulty seeing, even when wearing glasses/contacts?: No Does the patient have difficulty concentrating, remembering, or making decisions?: No  Permission Sought/Granted Permission sought to share information with : Facility Medical sales representative, Family Supports    Share  Information with NAME: Josh  Permission granted to share info w AGENCY: SNFs  Permission granted to share info w Relationship: Son  Permission granted to share info w Contact Information: [phone]  Emotional Assessment Appearance:: Appears stated age Attitude/Demeanor/Rapport: Engaged Affect (typically observed): Accepting Orientation: : Oriented to Self, Oriented to Place, Oriented to  Time, Oriented to Situation Alcohol / Substance Use: Not Applicable Psych Involvement: No (comment)  Admission diagnosis:  Lumbar adjacent segment disease with spondylolisthesis [M51.369, M43.16] Patient Active Problem List   Diagnosis Date Noted   Lumbar adjacent segment disease with spondylolisthesis 02/06/2024   Aortic atherosclerosis (HCC) 01/15/2023   History of diastolic dysfunction 04/18/2022   Mitral valve insufficiency 04/18/2022   Statin myopathy 11/23/2021   History of colitis 08/03/2021   Vitamin D  deficiency 07/12/2020   Chronic low back pain 07/12/2020   Sepsis without acute organ dysfunction (HCC) 12/29/2019   Cervical disc disorder 12/29/2019   Brachial radiculitis 12/29/2019   Allergy to statin medication 12/01/2019   History of below knee amputation, left (HCC) 07/30/2019   PAD (peripheral artery disease) (HCC) 12/05/2018   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) 09/18/2018   Class 2 severe obesity with serious comorbidity and body mass index (BMI) of 35.0 to 35.9 in adult (HCC) 11/01/2017   B12 deficiency 11/01/2017   Diabetic polyneuropathy associated with type 2 diabetes mellitus (HCC) 12/13/2015   Lumbar stenosis with neurogenic claudication 09/02/2015  Chronic constipation 07/13/2015   OSA (obstructive sleep apnea) 06/25/2015   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/23/2015   Hyperlipidemia 04/23/2015   Statin intolerance 04/23/2015   Asthma, mild intermittent 02/01/2015   Type 2 diabetes mellitus with diabetic neuropathy, with long-term current  use of insulin  (HCC) 12/31/2014   Gastroesophageal reflux disease with esophagitis 12/31/2014   Bilateral carpal tunnel syndrome 12/01/2014   Diverticulosis of colon 11/30/2012   Benign essential hypertension 11/28/2012   PCP:  [name], PA-C Pharmacy:   [facility] - [place], [place] - [address] [place] [address] Phone: [phone] Fax: [phone]  [facility] [address] [place] [address] Phone: [phone] Fax: [phone]  [id number] [place], [place] - [address] AT [facility] OF [address] [place] [address] Phone: [phone] Fax: [phone]     Social Drivers of Health (SDOH) Social History: SDOH Screenings   Food Insecurity: No Food Insecurity (02/06/2024)  Housing: Low Risk  (02/06/2024)  Transportation Needs: No Transportation Needs (02/06/2024)  Utilities: Not At Risk (02/06/2024)  Alcohol Screen: Low Risk  (10/27/2022)  Depression (PHQ2-9): Low Risk  (01/15/2023)  Financial Resource Strain: Low Risk  (10/27/2022)  Physical Activity: Sufficiently Active (10/27/2022)  Social Connections: Socially Integrated (02/06/2024)  Stress: No Stress Concern Present (10/27/2022)  Tobacco Use: Low Risk  (02/06/2024)   SDOH Interventions:     Readmission Risk Interventions     No data to display

## 2024-02-15 NOTE — Progress Notes (Signed)
 Physical Therapy Treatment Patient Details Name: Olivia Romero MRN: [id number] DOB: [birth year] Today's Date: 02/15/2024   History of Present Illness Pt is a 68 y/o F admitted on 02/06/24 for scheduled posterior lumbar interbody arthrodesis L3-L4, hardware removal L5, nonsegmental pedicle fixation L3-L4. PMH: anemia, asthma, dysphagia, GERD, HLD, HTN, IBS, neuropathy, stroke, DM2, L BKA    PT Comments  Pt up in chair on arrival, agreeable to session and demonstrating continued progress towards acute goals. Pt demonstrating increased activity tolerance, progressing gait distance with RW for support and grossly CGA for safety. Pt needing up to min A to maintain balance during gait with personal lofstrand crutches with noted increased postural sway and trunk flexion. Pt was educated on continued walker use to maximize functional independence, safety, and decrease risk for falls. Pt requiring min A to manage Les on return to supine with cues for log roll technique at end of session. Pt continues to benefit from skilled PT services to progress toward functional mobility goals.     If plan is discharge home, recommend the following: A little help with walking and/or transfers;A little help with bathing/dressing/bathroom;Assistance with cooking/housework;Assist for transportation;Help with stairs or ramp for entrance   Can travel by private vehicle        Equipment Recommendations  BSC/3in1    Recommendations for Other Services       Precautions / Restrictions Precautions Precautions: Fall;Back Recall of Precautions/Restrictions: Intact Precaution/Restrictions Comments: verbally reviewed back precautions during functional tasks Required Braces or Orthoses:  (no brace required) Restrictions Weight Bearing Restrictions Per Provider Order: No Other Position/Activity Restrictions: back precautions     Mobility  Bed Mobility Overal bed mobility: Needs Assistance Bed Mobility: Rolling, Sit to  Sidelying Rolling: Supervision       Sit to sidelying: Min assist General bed mobility comments: min A to manage LEs into bed with log roll, min cues to complete    Transfers Overall transfer level: Needs assistance Equipment used: Rolling walker (2 wheels) Transfers: Sit to/from Stand Sit to Stand: Contact guard assist           General transfer comment: cues for hand placement    Ambulation/Gait Ambulation/Gait assistance: Contact guard assist, Min assist Gait Distance (Feet): 15 Feet (+ 95' + 20') Assistive device: Rolling walker (2 wheels), Lofstrands Gait Pattern/deviations: Decreased step length - right, Decreased step length - left, Decreased stride length Gait velocity: decr     General Gait Details: CGA for RW for support, light cues for upright posture, min A to maintain balance with lofstrand crutches   Stairs             Wheelchair Mobility     Tilt Bed    Modified Rankin (Stroke Patients Only)       Balance Overall balance assessment: Needs assistance Sitting-balance support: Feet supported, Single extremity supported, No upper extremity supported Sitting balance-Leahy Scale: Fair     Standing balance support: Bilateral upper extremity supported, During functional activity, Reliant on assistive device for balance Standing balance-Leahy Scale: Poor Standing balance comment: reliant on device and intermittent external assist                            Communication Communication Communication: No apparent difficulties  Cognition Arousal: Alert Behavior During Therapy: WFL for tasks assessed/performed, Anxious   PT - Cognitive impairments: No apparent impairments  Following commands: Intact      Cueing Cueing Techniques: Verbal cues  Exercises      General Comments General comments (skin integrity, edema, etc.): assist to doff LLE prosthesis and R brace and shoe while maintaining back  precautions      Pertinent Vitals/Pain Pain Assessment Pain Assessment: Faces Faces Pain Scale: Hurts little more Pain Location: back (R hip/sciatic nerve) Pain Descriptors / Indicators: Discomfort, Pounding, Grimacing, Guarding Pain Intervention(s): Monitored during session, Limited activity within patient's tolerance    Home Living                          Prior Function            PT Goals (current goals can now be found in the care plan section) Acute Rehab PT Goals PT Goal Formulation: With patient Time For Goal Achievement: 02/21/24 Progress towards PT goals: Progressing toward goals    Frequency    Min 5X/week      PT Plan      Co-evaluation              AM-PAC PT 6 Clicks Mobility   Outcome Measure  Help needed turning from your back to your side while in a flat bed without using bedrails?: A Little Help needed moving from lying on your back to sitting on the side of a flat bed without using bedrails?: A Lot Help needed moving to and from a bed to a chair (including a wheelchair)?: A Little Help needed standing up from a chair using your arms (e.g., wheelchair or bedside chair)?: A Little Help needed to walk in hospital room?: A Little Help needed climbing 3-5 steps with a railing? : Total 6 Click Score: 15    End of Session   Activity Tolerance: Patient tolerated treatment well;Patient limited by fatigue;Patient limited by pain Patient left: with call bell/phone within reach;in bed Nurse Communication: Mobility status PT Visit Diagnosis: Pain;Muscle weakness (generalized) (M62.81);Other abnormalities of gait and mobility (R26.89);Difficulty in walking, not elsewhere classified (R26.2);Unsteadiness on feet (R26.81) Pain - part of body:  (back)     Time: 8950-8887 PT Time Calculation (min) (ACUTE ONLY): 23 min  Charges:    $Gait Training: 23-37 mins PT General Charges $$ ACUTE PT VISIT: 1 Visit                     Caillou Minus R.  PTA Acute Rehabilitation Services Office: [phone]   Therisa CHRISTELLA Boor 02/15/2024, 11:34 AM

## 2024-02-15 NOTE — Progress Notes (Signed)
 Patient ID: Olivia Romero, female   DOB: 1955/07/21, 68 y.o.   MRN: [id number] BP (!) 109/50   Pulse 86   Temp 98 F (36.7 C) (Oral)   Resp 18   Ht 4' 11.5 (1.511 m)   Wt 80.1 kg   LMP  (LMP Unknown)   SpO2 100%   BMI 35.07 kg/m  Alert and oriented x 4 Moving extremities Wound with some drainage Waiting on placement

## 2024-02-16 LAB — GLUCOSE, CAPILLARY
Glucose-Capillary: 253 mg/dL — ABNORMAL HIGH (ref 70–99)
Glucose-Capillary: 203 mg/dL — ABNORMAL HIGH (ref 70–99)
Glucose-Capillary: 217 mg/dL — ABNORMAL HIGH (ref 70–99)
Glucose-Capillary: 226 mg/dL — ABNORMAL HIGH (ref 70–99)

## 2024-02-16 NOTE — Progress Notes (Signed)
 Patient ID: Olivia Romero, female   DOB: 1956-03-11, 68 y.o.   MRN: [id number] BP 130/79 (BP Location: Left Arm)   Pulse 90   Temp (!) 97.5 F (36.4 C) (Oral)   Resp 18   Ht 4' 11.5 (1.511 m)   Wt 80.1 kg   LMP  (LMP Unknown)   SpO2 98%   BMI 35.07 kg/m  Alert and oriented x 4, speech is clear and fluent. Moving all extremities Wound with small amount of drainage. No signs of infection.  Still waiting for placement, snf.  Continue PT, OT

## 2024-02-16 NOTE — Plan of Care (Signed)

## 2024-02-16 NOTE — Progress Notes (Signed)
 Mobility Specialist Progress Note:    02/16/24 1126  Mobility  Activity Ambulated with assistance (In hallway)  Level of Assistance Contact guard assist, steadying assist  Assistive Device Front wheel walker  Distance Ambulated (ft) 80 ft  Activity Response Tolerated well  Mobility Referral Yes  Mobility visit 1 Mobility  Mobility Specialist Start Time (ACUTE ONLY) 1049  Mobility Specialist Stop Time (ACUTE ONLY) 1111  Mobility Specialist Time Calculation (min) (ACUTE ONLY) 22 min   Received pt in bed and agreeable to mobility. No physical assistance needed. C/o back pain, otherwise tolerated well. Returned to room without fault. Left pt in chair. Personal belongings and call light within reach. All needs met.  Lavanda Pollack Mobility Specialist  Please contact via [employer] or  Rehab Office [phone]

## 2024-02-17 LAB — GLUCOSE, CAPILLARY
Glucose-Capillary: 148 mg/dL — ABNORMAL HIGH (ref 70–99)
Glucose-Capillary: 225 mg/dL — ABNORMAL HIGH (ref 70–99)
Glucose-Capillary: 209 mg/dL — ABNORMAL HIGH (ref 70–99)

## 2024-02-17 NOTE — Progress Notes (Signed)
 Mobility Specialist Progress Note:    02/17/24 1231  Mobility  Activity Ambulated with assistance (In hallway')  Level of Assistance Contact guard assist, steadying assist  Assistive Device Front wheel walker  Distance Ambulated (ft) 22 ft (x4)  Activity Response Tolerated well  Mobility Referral Yes  Mobility visit 1 Mobility  Mobility Specialist Start Time (ACUTE ONLY) 1104  Mobility Specialist Stop Time (ACUTE ONLY) 1124  Mobility Specialist Time Calculation (min) (ACUTE ONLY) 20 min   Received pt in chair and agreeable to mobility. No physical assistance needed. No c/o. Returned to room without fault. Pt left in chair with personal belongings and call light within reach. All needs met.  Lavanda Pollack Mobility Specialist  Please contact via [employer] or  Rehab Office [phone]'

## 2024-02-17 NOTE — Progress Notes (Signed)
   Providing Compassionate, Quality Care - Together   Subjective: Patient reports no new issues.  Objective: Vital signs in last 24 hours: Temp:  [97.7 F (36.5 C)-98.8 F (37.1 C)] 98.2 F (36.8 C) (09/14 0900) Pulse Rate:  [84-95] 95 (09/14 0900) Resp:  [16-17] 16 (09/14 0900) BP: (100-138)/(44-61) 138/61 (09/14 0900) SpO2:  [94 %-100 %] 100 % (09/14 0900)  Intake/Output from previous day: 09/13 0701 - 09/14 0700 In: 710 [P.O.:710] Out: -  Intake/Output this shift: No intake/output data recorded.  Alert and oriented x 4, sitting up in chair PERRLA CN II-XII grossly intact MAE, LLE prosthesi Incision is clean, dry, and intact. No drainage noted.  Lab Results: No results for input(s): WBC, HGB, HCT, PLT in the last 72 hours. BMET No results for input(s): NA, K, CL, CO2, GLUCOSE, BUN, CREATININE, CALCIUM in the last 72 hours.  Studies/Results: No results found.  Assessment/Plan: Patient underwent L3-4 PLIF by Dr. Gillie on 02/06/2024.   LOS: 9 days   -Awaiting SNF placement. Patient would prefer CIR. -Patient would be interested in discharging home if she continues to improve and is able to get her prosthesis on on her own.  I am in communication with my attending and they agree with the plan for this patient.   Gerard Beck, DNP, AGNP-C Nurse Practitioner  [facility] [address], [address], [place], [place] [address] P: [phone]    F: [phone]  02/17/2024, 11:22 AM

## 2024-02-17 NOTE — Plan of Care (Signed)

## 2024-02-18 LAB — GLUCOSE, CAPILLARY
Glucose-Capillary: 145 mg/dL — ABNORMAL HIGH (ref 70–99)
Glucose-Capillary: 207 mg/dL — ABNORMAL HIGH (ref 70–99)

## 2024-02-18 NOTE — Plan of Care (Signed)
   Problem: Clinical Measurements: Goal: Will remain free from infection Outcome: Progressing   Problem: Activity: Goal: Risk for activity intolerance will decrease Outcome: Progressing   Problem: Nutrition: Goal: Adequate nutrition will be maintained Outcome: Progressing   Problem: Coping: Goal: Level of anxiety will decrease Outcome: Progressing   Problem: Pain Managment: Goal: General experience of comfort will improve and/or be controlled Outcome: Progressing   Problem: Safety: Goal: Ability to remain free from injury will improve Outcome: Progressing

## 2024-02-18 NOTE — Progress Notes (Signed)
 Occupational Therapy Treatment Patient Details Name: Olivia Romero MRN: [id number] DOB: 07-06-1955 Today's Date: 02/18/2024   History of present illness Pt is a 68 y/o F admitted on 02/06/24 for scheduled posterior lumbar interbody arthrodesis L3-L4, hardware removal L5, nonsegmental pedicle fixation L3-L4. PMH: anemia, asthma, dysphagia, GERD, HLD, HTN, IBS, neuropathy, stroke, DM2, L BKA   OT comments  Pt progressing toward established OT goals. Challenging return to ADL with functional adherence to precautions, problem solving, and activity tolerance this session with consecutive ADL tasks of LB ADL, toileting, grooming x2 tasks. Pt needing min assist for standing ADL and up to mod A for LB ADL with AE. Pt continues to present with decreased functional independence as it relates to BADL performance. Due to significant functional decline, recommending intensive multidisciplinary rehabilitation >3 hours/day to optimize safety and independence in ADL.  SABRA        If plan is discharge home, recommend the following:  Two people to help with walking and/or transfers;A lot of help with bathing/dressing/bathroom;Assistance with cooking/housework;Assist for transportation;Help with stairs or ramp for entrance   Equipment Recommendations  None recommended by OT    Recommendations for Other Services Rehab consult    Precautions / Restrictions Precautions Precautions: Fall;Back Recall of Precautions/Restrictions: Intact Precaution/Restrictions Comments: verbally reviewed back precautions during functional tasks Required Braces or Orthoses:  (no brace required) Restrictions Weight Bearing Restrictions Per Provider Order: No Other Position/Activity Restrictions: back precautions       Mobility Bed Mobility               General bed mobility comments: up in recliner    Transfers Overall transfer level: Needs assistance Equipment used: Rolling walker (2 wheels) Transfers: Sit  to/from Stand Sit to Stand: Contact guard assist                 Balance Overall balance assessment: Needs assistance Sitting-balance support: Feet supported, Single extremity supported, No upper extremity supported Sitting balance-Leahy Scale: Fair     Standing balance support: Bilateral upper extremity supported, During functional activity, Reliant on assistive device for balance Standing balance-Leahy Scale: Poor                             ADL either performed or assessed with clinical judgement   ADL Overall ADL's : Needs assistance/impaired     Grooming: Minimal assistance;Standing (adhering to back precautions) Grooming Details (indicate cue type and reason): intermittent UE support             Lower Body Dressing: Moderate assistance;Sitting/lateral leans Lower Body Dressing Details (indicate cue type and reason): dense education with use of reacher and compensatory techniques Toilet Transfer: Minimal assistance;Rolling walker (2 wheels)                  Extremity/Trunk Assessment Upper Extremity Assessment Upper Extremity Assessment: Right hand dominant (BUE neuropathy) RUE Deficits / Details: gross strength 4/5; hx of peripheral neuropathy; AROM and coordination WFL RUE Sensation: history of peripheral neuropathy RUE Coordination: WNL LUE Deficits / Details: gross strength 4- to 4/5; hx of peripheral neuropathy; hx of shoulder fx with mildly decreased internal/external shoulder rotation at baseline but with AROM largely [facility] and all other AROM WFL; coordination WFL LUE Sensation: history of peripheral neuropathy   Lower Extremity Assessment Lower Extremity Assessment: Defer to PT evaluation        Vision   Vision Assessment?: No apparent visual deficits Additional Comments: decr  contrast sensitivity   Perception     Praxis     Communication Communication Communication: No apparent difficulties   Cognition Arousal:  Alert Behavior During Therapy: WFL for tasks assessed/performed, Anxious Cognition: No apparent impairments             OT - Cognition Comments: able to recall precautions and generally education previously taught, however, with decreased functional implementation at times.                 Following commands: Intact        Cueing   Cueing Techniques: Verbal cues  Exercises      Shoulder Instructions       General Comments      Pertinent Vitals/ Pain       Pain Assessment Pain Assessment: Faces Faces Pain Scale: Hurts little more Pain Location: back Pain Descriptors / Indicators: Discomfort, Pounding, Grimacing, Guarding Pain Intervention(s): Limited activity within patient's tolerance, Monitored during session  Home Living                                          Prior Functioning/Environment              Frequency  Min 2X/week        Progress Toward Goals  OT Goals(current goals can now be found in the care plan section)  Progress towards OT goals: Progressing toward goals  Acute Rehab OT Goals Patient Stated Goal: go to rehab OT Goal Formulation: With patient Time For Goal Achievement: 02/21/24 Potential to Achieve Goals: Good ADL Goals Pt Will Perform Grooming: standing;with supervision Pt Will Perform Lower Body Bathing: with min assist;with adaptive equipment;sitting/lateral leans;sit to/from stand Pt Will Perform Lower Body Dressing: with min assist;with adaptive equipment;sitting/lateral leans;sit to/from stand Pt Will Transfer to Toilet: with supervision;ambulating;regular height toilet;grab bars Pt Will Perform Toileting - Clothing Manipulation and hygiene: with contact guard assist;sitting/lateral leans;sit to/from stand  Plan      Co-evaluation                 AM-PAC OT 6 Clicks Daily Activity     Outcome Measure   Help from another person eating meals?: A Little Help from another person taking  care of personal grooming?: A Little Help from another person toileting, which includes using toliet, bedpan, or urinal?: A Lot Help from another person bathing (including washing, rinsing, drying)?: A Lot Help from another person to put on and taking off regular upper body clothing?: A Little Help from another person to put on and taking off regular lower body clothing?: A Lot 6 Click Score: 15    End of Session Equipment Utilized During Treatment: Gait belt;Rolling walker (2 wheels) (L LE prosthesis)  OT Visit Diagnosis: Unsteadiness on feet (R26.81);Other abnormalities of gait and mobility (R26.89);Pain;Other (comment)   Activity Tolerance Patient tolerated treatment well   Patient Left in chair;with call bell/phone within reach;with chair alarm set   Nurse Communication Mobility status;Other (comment)        Time: 9070-8996 OT Time Calculation (min): 34 min  Charges: OT General Charges $OT Visit: 1 Visit OT Treatments $Self Care/Home Management : 23-37 mins  Elma JONETTA Lebron FREDERICK, OTR/L [facility] Acute Rehabilitation Office: [phone]   Elma JONETTA Lebron 02/18/2024, 10:42 AM

## 2024-02-18 NOTE — Progress Notes (Signed)
 Physical Therapy Treatment Patient Details Name: Olivia Romero MRN: [id number] DOB: 02/13/1956 Today's Date: 02/18/2024   History of Present Illness Pt is a 68 y/o F admitted on 02/06/24 for scheduled posterior lumbar interbody arthrodesis L3-L4, hardware removal L5, nonsegmental pedicle fixation L3-L4. PMH: anemia, asthma, dysphagia, GERD, HLD, HTN, IBS, neuropathy, stroke, DM2, L BKA    PT Comments  Pt making gradual progress.  She was able to increase gait distance.  Needs bed rails or assist for transfers.  Does need assist donning prosthesis at this time.  Cont POC. Patient will benefit from continued inpatient follow up therapy, <3 hours/day (noted AIR denied by ins).   If plan is discharge home, recommend the following: A little help with walking and/or transfers;A little help with bathing/dressing/bathroom;Assistance with cooking/housework;Assist for transportation;Help with stairs or ramp for entrance   Can travel by private vehicle        Equipment Recommendations  BSC/3in1    Recommendations for Other Services       Precautions / Restrictions Precautions Precautions: Fall;Back     Mobility  Bed Mobility Overal bed mobility: Needs Assistance Bed Mobility: Rolling, Sidelying to Sit Rolling: Supervision Sidelying to sit: Contact guard assist, HOB elevated, Used rails       General bed mobility comments: Good log roll technique    Transfers Overall transfer level: Needs assistance Equipment used: Rolling walker (2 wheels) Transfers: Sit to/from Stand Sit to Stand: Contact guard assist           General transfer comment: CGA for safety    Ambulation/Gait Ambulation/Gait assistance: Contact guard assist Gait Distance (Feet): 80 Feet Assistive device: Rolling walker (2 wheels) Gait Pattern/deviations: Step-through pattern, Decreased stride length Gait velocity: decreased     General Gait Details: CGA for safety; min cues for upright posture; chair  follow safety   Stairs             Wheelchair Mobility     Tilt Bed    Modified Rankin (Stroke Patients Only)       Balance Overall balance assessment: Needs assistance Sitting-balance support: Feet supported, No upper extremity supported Sitting balance-Leahy Scale: Good     Standing balance support: Bilateral upper extremity supported, During functional activity, Reliant on assistive device for balance Standing balance-Leahy Scale: Poor Standing balance comment: reliant on device and intermittent external assist                            Communication    Cognition Arousal: Alert Behavior During Therapy: WFL for tasks assessed/performed   PT - Cognitive impairments: No apparent impairments                                Cueing    Exercises      General Comments General comments (skin integrity, edema, etc.): REquired assist to don prosthesis at EOB - does not have enough trunk flexion to do on her own at this time, limited by pain      Pertinent Vitals/Pain Pain Assessment Pain Assessment: Faces Faces Pain Scale: Hurts little more Pain Location: back and posterior L hip Pain Descriptors / Indicators: Discomfort (pulling in L hip) Pain Intervention(s): Limited activity within patient's tolerance, Monitored during session, Premedicated before session, Repositioned (increased with initial movement and walking; eased at rest)    Home Living  Prior Function            PT Goals (current goals can now be found in the care plan section) Progress towards PT goals: Progressing toward goals    Frequency    Min 5X/week      PT Plan      Co-evaluation              AM-PAC PT 6 Clicks Mobility   Outcome Measure  Help needed turning from your back to your side while in a flat bed without using bedrails?: A Little Help needed moving from lying on your back to sitting on the side of a  flat bed without using bedrails?: A Little Help needed moving to and from a bed to a chair (including a wheelchair)?: A Little Help needed standing up from a chair using your arms (e.g., wheelchair or bedside chair)?: A Little Help needed to walk in hospital room?: A Little Help needed climbing 3-5 steps with a railing? : A Lot 6 Click Score: 17    End of Session Equipment Utilized During Treatment: Gait belt Activity Tolerance: Patient tolerated treatment well Patient left: in chair;with call bell/phone within reach;Other (comment) (no alarm - reports going to bathroom I'ly today, did leave bathroom door open to make easier/safer) Nurse Communication: Mobility status PT Visit Diagnosis: Pain;Muscle weakness (generalized) (M62.81);Other abnormalities of gait and mobility (R26.89);Difficulty in walking, not elsewhere classified (R26.2);Unsteadiness on feet (R26.81)     Time: 8346-8283 PT Time Calculation (min) (ACUTE ONLY): 23 min  Charges:    $Gait Training: 8-22 mins $Therapeutic Activity: 8-22 mins PT General Charges $$ ACUTE PT VISIT: 1 Visit                     Benjiman, PT Acute Rehab [facility] Rehab [phone]    Benjiman VEAR Mulberry 02/18/2024, 5:22 PM

## 2024-02-19 LAB — GLUCOSE, CAPILLARY
Glucose-Capillary: 106 mg/dL — ABNORMAL HIGH (ref 70–99)
Glucose-Capillary: 243 mg/dL — ABNORMAL HIGH (ref 70–99)

## 2024-02-19 MED ORDER — DEXAMETHASONE 4 MG PO TABS
4.0000 mg | ORAL_TABLET | Freq: Three times a day (TID) | ORAL | Status: DC
  Administered 2024-02-19 – 2024-02-21 (×6): 4 mg via ORAL
  Filled 2024-02-19 (×8): qty 1

## 2024-02-19 NOTE — Plan of Care (Signed)
   Problem: Education: Goal: Knowledge of General Education information will improve Description: Including pain rating scale, medication(s)/side effects and non-pharmacologic comfort measures Outcome: Progressing   Problem: Activity: Goal: Risk for activity intolerance will decrease Outcome: Progressing   Problem: Nutrition: Goal: Adequate nutrition will be maintained Outcome: Progressing   Problem: Coping: Goal: Level of anxiety will decrease Outcome: Progressing   Problem: Elimination: Goal: Will not experience complications related to bowel motility Outcome: Progressing   Problem: Safety: Goal: Ability to remain free from injury will improve Outcome: Progressing

## 2024-02-19 NOTE — Progress Notes (Addendum)
 Mobility Specialist: Progress Note   02/19/24 1600  Mobility  Activity Ambulated with assistance  Level of Assistance Contact guard assist, steadying assist  Assistive Device Front wheel walker  Distance Ambulated (ft) 90 ft  Activity Response Tolerated well  Mobility Referral Yes  Mobility visit 1 Mobility  Mobility Specialist Start Time (ACUTE ONLY) 1447  Mobility Specialist Stop Time (ACUTE ONLY) 1514  Mobility Specialist Time Calculation (min) (ACUTE ONLY) 27 min    Pt received in bed, very pleasant and agreeable to mobility session. Able to recall 3/3 back precautions. SV for bed mobility with good logroll technique. Donned LLE prosthetic and R shoe/ankle brace at EOB with modA. CGA throughout ambulation. C/o sharp pain between her L buttock and posterior L thigh and described it feeling like a strained muscle. Returned to room without fault. Left in bed with all needs met, call bell in reach.   Ileana Lute Mobility Specialist Please contact via SecureChat or Rehab office at [phone]

## 2024-02-19 NOTE — Progress Notes (Signed)
   Providing Compassionate, Quality Care - Together   Subjective: Patient reports pain in her SI joints bilaterally that started yesterday.  Objective: Vital signs in last 24 hours: Temp:  [97.5 F (36.4 C)-98.9 F (37.2 C)] 98.3 F (36.8 C) (09/16 1634) Pulse Rate:  [80-86] 80 (09/16 1634) Resp:  [17-18] 18 (09/16 1634) BP: (110-131)/(49-58) 110/57 (09/16 1634) SpO2:  [98 %-100 %] 100 % (09/16 1634)  Intake/Output from previous day: 09/15 0701 - 09/16 0700 In: 1680 [P.O.:1680] Out: 1 [Stool:1] Intake/Output this shift: Total I/O In: 1440 [P.O.:1440] Out: -    Alert and oriented x 4, sitting up in chair PERRLA CN II-XII grossly intact MAE, LLE prosthesis Incision is clean, dry, and intact. No drainage noted.  Lab Results: No results for input(s): WBC, HGB, HCT, PLT in the last 72 hours. BMET No results for input(s): NA, K, CL, CO2, GLUCOSE, BUN, CREATININE, CALCIUM in the last 72 hours.  Studies/Results: No results found.  Assessment/Plan: Patient underwent L3-4 PLIF by Dr. Gillie on 02/06/2024.   LOS: 11 days   -Added 4 mg decadron  q8 hours. Patient can discharge home on a Medrol  Dosepak if the steroid helps her SI pain. -Awaiting SNF placement.  I am in communication with my attending and they agree with the plan for this patient.   Gerard Beck, DNP, AGNP-C Nurse Practitioner  [facility] [address], [place], [place] [address] P: [phone]    F: [phone]  02/19/2024, 5:12 PM

## 2024-02-19 NOTE — Progress Notes (Signed)
 Physical Therapy Treatment Patient Details Name: Olivia Romero MRN: [id number] DOB: 08/26/1955 Today's Date: 02/19/2024   History of Present Illness Pt is a 68 y/o F admitted on 02/06/24 for scheduled posterior lumbar interbody arthrodesis L3-L4, hardware removal L5, nonsegmental pedicle fixation L3-L4. PMH: anemia, asthma, dysphagia, GERD, HLD, HTN, IBS, neuropathy, stroke, DM2, L BKA    PT Comments  Pt up in chair on arrival, agreeable to session and demonstrating continued progress towards acute goals. Pt with increased c/o radicular pain through L hip/buttocks at rest, however no increase in pain with mobility. Educated pt on positioning while up in chair and feet supported at end of session in sitting. Pt increasing gait distance this session with RW for support and grossly CGA for safety, however continues to be limited by decreased activity tolerance. Pt continues to benefit from skilled PT services to progress toward functional mobility goals.     If plan is discharge home, recommend the following: A little help with walking and/or transfers;A little help with bathing/dressing/bathroom;Assistance with cooking/housework;Assist for transportation;Help with stairs or ramp for entrance   Can travel by private vehicle        Equipment Recommendations  BSC/3in1    Recommendations for Other Services Rehab consult     Precautions / Restrictions Precautions Precautions: Fall;Back Recall of Precautions/Restrictions: Intact Precaution/Restrictions Comments: verbally reviewed back precautions during functional tasks Required Braces or Orthoses:  (no brace required) Restrictions Weight Bearing Restrictions Per Provider Order: No Other Position/Activity Restrictions: back precautions     Mobility  Bed Mobility Overal bed mobility: Needs Assistance             General bed mobility comments: pt up in chair on arrival and at end of session    Transfers Overall transfer level:  Needs assistance Equipment used: Rolling walker (2 wheels) Transfers: Sit to/from Stand Sit to Stand: Contact guard assist, Min assist           General transfer comment: CGA for safety from recliner and commode, light min A from lolw EOB    Ambulation/Gait Ambulation/Gait assistance: Contact guard assist Gait Distance (Feet): 15 Feet (+110') Assistive device: Rolling walker (2 wheels) Gait Pattern/deviations: Step-through pattern, Decreased stride length Gait velocity: decreased     General Gait Details: CGA for safety; min cues for upright posture   Stairs             Wheelchair Mobility     Tilt Bed    Modified Rankin (Stroke Patients Only)       Balance Overall balance assessment: Needs assistance Sitting-balance support: Feet supported, No upper extremity supported Sitting balance-Leahy Scale: Good     Standing balance support: Bilateral upper extremity supported, During functional activity, Reliant on assistive device for balance Standing balance-Leahy Scale: Poor Standing balance comment: reliant on device and intermittent external assist                            Communication Communication Communication: No apparent difficulties  Cognition Arousal: Alert Behavior During Therapy: WFL for tasks assessed/performed   PT - Cognitive impairments: No apparent impairments                         Following commands: Intact      Cueing Cueing Techniques: Verbal cues  Exercises      General Comments General comments (skin integrity, edema, etc.): assis to don R brace and shoe  Pertinent Vitals/Pain Pain Assessment Pain Assessment: Faces Faces Pain Scale: Hurts even more Pain Location: back and posterior L hip Pain Descriptors / Indicators: Discomfort (pulling in L hip) Pain Intervention(s): Premedicated before session, Monitored during session, Limited activity within patient's tolerance    Home Living                           Prior Function            PT Goals (current goals can now be found in the care plan section) Acute Rehab PT Goals PT Goal Formulation: With patient Time For Goal Achievement: 02/21/24 Progress towards PT goals: Progressing toward goals    Frequency    Min 5X/week      PT Plan      Co-evaluation              AM-PAC PT 6 Clicks Mobility   Outcome Measure  Help needed turning from your back to your side while in a flat bed without using bedrails?: A Little Help needed moving from lying on your back to sitting on the side of a flat bed without using bedrails?: A Little Help needed moving to and from a bed to a chair (including a wheelchair)?: A Little Help needed standing up from a chair using your arms (e.g., wheelchair or bedside chair)?: A Little Help needed to walk in hospital room?: A Little Help needed climbing 3-5 steps with a railing? : A Lot 6 Click Score: 17    End of Session   Activity Tolerance: Patient tolerated treatment well Patient left: in chair;with call bell/phone within reach Nurse Communication: Mobility status PT Visit Diagnosis: Pain;Muscle weakness (generalized) (M62.81);Other abnormalities of gait and mobility (R26.89);Difficulty in walking, not elsewhere classified (R26.2);Unsteadiness on feet (R26.81) Pain - part of body:  (back)     Time: 9091-9068 PT Time Calculation (min) (ACUTE ONLY): 23 min  Charges:    $Gait Training: 8-22 mins $Therapeutic Activity: 8-22 mins PT General Charges $$ ACUTE PT VISIT: 1 Visit                     Tamella Tuccillo R. PTA Acute Rehabilitation Services Office: [phone]   Therisa CHRISTELLA Boor 02/19/2024, 9:36 AM

## 2024-02-19 NOTE — TOC Progression Note (Signed)
 Transition of Care [facility]) - Progression Note    Patient Details  Name: Olivia Romero MRN: [id number] Date of Birth: 1955/08/01  Transition [facility] CM/SW Contact  Biance Moncrief LITTIE [place], [place] Phone Number: 02/19/2024, 3:21 PM  Clinical Narrative:    CSW initiated insurance process. [facility] can accept pt pending insurance approval. [employer] to call CSW with approval decision. CSW will continue to follow.   Expected Discharge Plan: Skilled Nursing Facility Barriers to Discharge: English as a second language teacher, Continued Medical Work up, SNF Pending bed offer               Expected Discharge Plan and Services       Living arrangements for the past 2 months: Single Family Home                                       Social Drivers of Health (SDOH) Interventions SDOH Screenings   Food Insecurity: No Food Insecurity (02/06/2024)  Housing: Low Risk  (02/06/2024)  Transportation Needs: No Transportation Needs (02/06/2024)  Utilities: Not At Risk (02/06/2024)  Alcohol Screen: Low Risk  (10/27/2022)  Depression (PHQ2-9): Low Risk  (01/15/2023)  Financial Resource Strain: Low Risk  (10/27/2022)  Physical Activity: Sufficiently Active (10/27/2022)  Social Connections: Socially Integrated (02/06/2024)  Stress: No Stress Concern Present (10/27/2022)  Tobacco Use: Low Risk  (02/06/2024)    Readmission Risk Interventions     No data to display

## 2024-02-19 NOTE — Plan of Care (Signed)
  Problem: Education: Goal: Knowledge of General Education information will improve Description: Including pain rating scale, medication(s)/side effects and non-pharmacologic comfort measures Outcome: Progressing   Problem: Health Behavior/Discharge Planning: Goal: Ability to manage health-related needs will improve Outcome: Progressing   Problem: Clinical Measurements: Goal: Ability to maintain clinical measurements within normal limits will improve Outcome: Progressing Goal: Will remain free from infection Outcome: Progressing Goal: Diagnostic test results will improve Outcome: Progressing Goal: Respiratory complications will improve Outcome: Progressing Goal: Cardiovascular complication will be avoided Outcome: Progressing   Problem: Activity: Goal: Risk for activity intolerance will decrease Outcome: Progressing   Problem: Nutrition: Goal: Adequate nutrition will be maintained Outcome: Progressing   Problem: Elimination: Goal: Will not experience complications related to bowel motility Outcome: Progressing Goal: Will not experience complications related to urinary retention Outcome: Progressing   Problem: Pain Managment: Goal: General experience of comfort will improve and/or be controlled Outcome: Progressing   Problem: Skin Integrity: Goal: Risk for impaired skin integrity will decrease Outcome: Progressing   Problem: Education: Goal: Ability to verbalize activity precautions or restrictions will improve Outcome: Progressing Goal: Knowledge of the prescribed therapeutic regimen will improve Outcome: Progressing Goal: Understanding of discharge needs will improve Outcome: Progressing

## 2024-02-20 LAB — GLUCOSE, CAPILLARY
Glucose-Capillary: 231 mg/dL — ABNORMAL HIGH (ref 70–99)
Glucose-Capillary: 237 mg/dL — ABNORMAL HIGH (ref 70–99)
Glucose-Capillary: 311 mg/dL — ABNORMAL HIGH (ref 70–99)

## 2024-02-20 NOTE — Progress Notes (Signed)
 Physical Therapy Treatment Patient Details Name: Olivia Romero MRN: [id number] DOB: 10/25/1955 Today's Date: 02/20/2024   History of Present Illness Pt is a 68 y/o F admitted on 02/06/24 for scheduled posterior lumbar interbody arthrodesis L3-L4, hardware removal L5, nonsegmental pedicle fixation L3-L4. PMH: anemia, asthma, dysphagia, GERD, HLD, HTN, IBS, neuropathy, stroke, DM2, L BKA    PT Comments  Pt up in chair on arrival, eager for mobility and demonstrating slow but steady progress towards acute goals. Pt continues to require close CGA during transfers and gait with RW support with pt neding frequent standing rest breaks due to fatigue and pain. Patient will benefit from continued inpatient follow up therapy, <3 hours/day, will continue to follow acutely.    If plan is discharge home, recommend the following: A little help with walking and/or transfers;A little help with bathing/dressing/bathroom;Assistance with cooking/housework;Assist for transportation;Help with stairs or ramp for entrance   Can travel by private vehicle        Equipment Recommendations  BSC/3in1    Recommendations for Other Services Rehab consult     Precautions / Restrictions Precautions Precautions: Fall;Back Recall of Precautions/Restrictions: Intact Precaution/Restrictions Comments: verbally reviewed back precautions during functional tasks Required Braces or Orthoses:  (no brace required) Restrictions Weight Bearing Restrictions Per Provider Order: No Other Position/Activity Restrictions: back precautions     Mobility  Bed Mobility Overal bed mobility: Needs Assistance             General bed mobility comments: pt up in chair on arrival and at end of session    Transfers Overall transfer level: Needs assistance Equipment used: Rolling walker (2 wheels) Transfers: Sit to/from Stand Sit to Stand: Contact guard assist           General transfer comment: CGA for safety from recliner  and commode    Ambulation/Gait Ambulation/Gait assistance: Contact guard assist Gait Distance (Feet): 15 Feet (+ 120) Assistive device: Rolling walker (2 wheels) Gait Pattern/deviations: Step-through pattern, Decreased stride length Gait velocity: decreased     General Gait Details: CGA for safety; min cues for upright posture, noted overpronation on R and narrow BOS   Stairs             Wheelchair Mobility     Tilt Bed    Modified Rankin (Stroke Patients Only)       Balance Overall balance assessment: Needs assistance Sitting-balance support: Feet supported, No upper extremity supported Sitting balance-Leahy Scale: Good     Standing balance support: Bilateral upper extremity supported, During functional activity, Reliant on assistive device for balance Standing balance-Leahy Scale: Poor Standing balance comment: reliant on device and intermittent external assist                            Communication Communication Communication: No apparent difficulties  Cognition Arousal: Alert Behavior During Therapy: WFL for tasks assessed/performed   PT - Cognitive impairments: No apparent impairments                         Following commands: Intact      Cueing Cueing Techniques: Verbal cues  Exercises      General Comments General comments (skin integrity, edema, etc.): assist to don R brace and shoe      Pertinent Vitals/Pain Pain Assessment Pain Assessment: Faces Faces Pain Scale: Hurts a little bit Pain Location: back and posterior L hip Pain Descriptors / Indicators: Discomfort (pulling in  L hip) Pain Intervention(s): Monitored during session, Limited activity within patient's tolerance    Home Living                          Prior Function            PT Goals (current goals can now be found in the care plan section) Acute Rehab PT Goals PT Goal Formulation: With patient Time For Goal Achievement:  02/21/24 Progress towards PT goals: Progressing toward goals    Frequency    Min 5X/week      PT Plan      Co-evaluation              AM-PAC PT 6 Clicks Mobility   Outcome Measure  Help needed turning from your back to your side while in a flat bed without using bedrails?: A Little Help needed moving from lying on your back to sitting on the side of a flat bed without using bedrails?: A Little Help needed moving to and from a bed to a chair (including a wheelchair)?: A Little Help needed standing up from a chair using your arms (e.g., wheelchair or bedside chair)?: A Little Help needed to walk in hospital room?: A Little Help needed climbing 3-5 steps with a railing? : A Lot 6 Click Score: 17    End of Session Equipment Utilized During Treatment: Gait belt Activity Tolerance: Patient tolerated treatment well Patient left: in chair;with call bell/phone within reach Nurse Communication: Mobility status PT Visit Diagnosis: Pain;Muscle weakness (generalized) (M62.81);Other abnormalities of gait and mobility (R26.89);Difficulty in walking, not elsewhere classified (R26.2);Unsteadiness on feet (R26.81) Pain - part of body:  (back)     Time: 1210-1230 PT Time Calculation (min) (ACUTE ONLY): 20 min  Charges:    $Gait Training: 8-22 mins PT General Charges $$ ACUTE PT VISIT: 1 Visit                     [name] R. PTA Acute Rehabilitation Services Office: [phone]   Therisa CHRISTELLA Boor 02/20/2024, 4:13 PM

## 2024-02-20 NOTE — Progress Notes (Signed)
   Providing Compassionate, Quality Care - Together   Subjective: Patient reports the pain in her SI area is improved since starting the decadron .  Objective: Vital signs in last 24 hours: Temp:  [97.5 F (36.4 C)-98.8 F (37.1 C)] 98.8 F (37.1 C) (09/17 1540) Pulse Rate:  [79-98] 98 (09/17 1540) Resp:  [16-18] 18 (09/17 1540) BP: (110-136)/(43-74) 130/53 (09/17 1540) SpO2:  [96 %-100 %] 100 % (09/17 1540)  Intake/Output from previous day: 09/16 0701 - 09/17 0700 In: 1440 [P.O.:1440] Out: 1500 [Urine:1500] Intake/Output this shift: Total I/O In: 354 [P.O.:354] Out: -   Alert and oriented x 4, sitting up in chair PERRLA CN II-XII grossly intact MAE, LLE prosthesis Incision is clean, dry, and intact. No drainage noted.  Lab Results: No results for input(s): WBC, HGB, HCT, PLT in the last 72 hours. BMET No results for input(s): NA, K, CL, CO2, GLUCOSE, BUN, CREATININE, CALCIUM in the last 72 hours.  Studies/Results: No results found.  Assessment/Plan: Patient underwent L3-4 PLIF by Dr. Gillie on 02/06/2024. She is slowly improving.   LOS: 12 days   -Decadron  seems to be helping. Patient can be discharged on a Medrol  Dosepak. -Awaiting SNF placement.  I am in communication with my attending and they agree with the plan for this patient.   Gerard Beck, DNP, AGNP-C Nurse Practitioner  [facility] [address], [place], [place] [address] P: [phone]    F: [phone]  02/20/2024, 4:02 PM

## 2024-02-20 NOTE — Plan of Care (Signed)
   Problem: Education: Goal: Knowledge of General Education information will improve Description Including pain rating scale, medication(s)/side effects and non-pharmacologic comfort measures Outcome: Progressing

## 2024-02-20 NOTE — TOC Progression Note (Signed)
 Transition of Care [facility]) - Progression Note    Patient Details  Name: Olivia Romero MRN: [id number] Date of Birth: 15-May-1956  [facility] CM/SW Contact  Lizzet Hendley LITTIE [place], [place] Phone Number: 02/20/2024, 3:10 PM  Clinical Narrative:    CSW contacted [employer] regarding auth decision. No one answered the phone, CSW left a confidential voicemail and provided call back information. CSW will continue to follow.   Expected Discharge Plan: Skilled Nursing Facility Barriers to Discharge: English as a second language teacher, Continued Medical Work up, SNF Pending bed offer               Expected Discharge Plan and Services       Living arrangements for the past 2 months: Single Family Home                                       Social Drivers of Health (SDOH) Interventions SDOH Screenings   Food Insecurity: No Food Insecurity (02/06/2024)  Housing: Low Risk  (02/06/2024)  Transportation Needs: No Transportation Needs (02/06/2024)  Utilities: Not At Risk (02/06/2024)  Alcohol Screen: Low Risk  (10/27/2022)  Depression (PHQ2-9): Low Risk  (01/15/2023)  Financial Resource Strain: Low Risk  (10/27/2022)  Physical Activity: Sufficiently Active (10/27/2022)  Social Connections: Socially Integrated (02/06/2024)  Stress: No Stress Concern Present (10/27/2022)  Tobacco Use: Low Risk  (02/06/2024)    Readmission Risk Interventions     No data to display

## 2024-02-20 NOTE — Plan of Care (Signed)

## 2024-02-21 DIAGNOSIS — M4316 Spondylolisthesis, lumbar region: Secondary | ICD-10-CM | POA: Diagnosis not present

## 2024-02-21 DIAGNOSIS — M5090 Cervical disc disorder, unspecified, unspecified cervical region: Secondary | ICD-10-CM | POA: Diagnosis not present

## 2024-02-21 DIAGNOSIS — E1165 Type 2 diabetes mellitus with hyperglycemia: Secondary | ICD-10-CM | POA: Diagnosis not present

## 2024-02-21 DIAGNOSIS — M79674 Pain in right toe(s): Secondary | ICD-10-CM | POA: Diagnosis not present

## 2024-02-21 DIAGNOSIS — R2681 Unsteadiness on feet: Secondary | ICD-10-CM | POA: Diagnosis not present

## 2024-02-21 DIAGNOSIS — E785 Hyperlipidemia, unspecified: Secondary | ICD-10-CM | POA: Diagnosis not present

## 2024-02-21 DIAGNOSIS — G5603 Carpal tunnel syndrome, bilateral upper limbs: Secondary | ICD-10-CM | POA: Diagnosis not present

## 2024-02-21 DIAGNOSIS — M14671 Charcot's joint, right ankle and foot: Secondary | ICD-10-CM | POA: Diagnosis not present

## 2024-02-21 DIAGNOSIS — Z8673 Personal history of transient ischemic attack (TIA), and cerebral infarction without residual deficits: Secondary | ICD-10-CM | POA: Diagnosis not present

## 2024-02-21 DIAGNOSIS — I700 Atherosclerosis of aorta: Secondary | ICD-10-CM | POA: Diagnosis not present

## 2024-02-21 DIAGNOSIS — R2689 Other abnormalities of gait and mobility: Secondary | ICD-10-CM | POA: Diagnosis not present

## 2024-02-21 DIAGNOSIS — Z89512 Acquired absence of left leg below knee: Secondary | ICD-10-CM | POA: Diagnosis not present

## 2024-02-21 DIAGNOSIS — Z7401 Bed confinement status: Secondary | ICD-10-CM | POA: Diagnosis not present

## 2024-02-21 DIAGNOSIS — M51369 Other intervertebral disc degeneration, lumbar region without mention of lumbar back pain or lower extremity pain: Secondary | ICD-10-CM | POA: Diagnosis not present

## 2024-02-21 DIAGNOSIS — Z741 Need for assistance with personal care: Secondary | ICD-10-CM | POA: Diagnosis not present

## 2024-02-21 DIAGNOSIS — I739 Peripheral vascular disease, unspecified: Secondary | ICD-10-CM | POA: Diagnosis not present

## 2024-02-21 DIAGNOSIS — E1149 Type 2 diabetes mellitus with other diabetic neurological complication: Secondary | ICD-10-CM | POA: Diagnosis not present

## 2024-02-21 DIAGNOSIS — M6281 Muscle weakness (generalized): Secondary | ICD-10-CM | POA: Diagnosis not present

## 2024-02-21 DIAGNOSIS — I959 Hypotension, unspecified: Secondary | ICD-10-CM | POA: Diagnosis not present

## 2024-02-21 DIAGNOSIS — J4520 Mild intermittent asthma, uncomplicated: Secondary | ICD-10-CM | POA: Diagnosis not present

## 2024-02-21 DIAGNOSIS — Z981 Arthrodesis status: Secondary | ICD-10-CM | POA: Diagnosis not present

## 2024-02-21 DIAGNOSIS — I340 Nonrheumatic mitral (valve) insufficiency: Secondary | ICD-10-CM | POA: Diagnosis not present

## 2024-02-21 DIAGNOSIS — E1142 Type 2 diabetes mellitus with diabetic polyneuropathy: Secondary | ICD-10-CM | POA: Diagnosis not present

## 2024-02-21 DIAGNOSIS — M48062 Spinal stenosis, lumbar region with neurogenic claudication: Secondary | ICD-10-CM | POA: Diagnosis not present

## 2024-02-21 DIAGNOSIS — G4733 Obstructive sleep apnea (adult) (pediatric): Secondary | ICD-10-CM | POA: Diagnosis not present

## 2024-02-21 DIAGNOSIS — L84 Corns and callosities: Secondary | ICD-10-CM | POA: Diagnosis not present

## 2024-02-21 DIAGNOSIS — I10 Essential (primary) hypertension: Secondary | ICD-10-CM | POA: Diagnosis not present

## 2024-02-21 DIAGNOSIS — K2100 Gastro-esophageal reflux disease with esophagitis, without bleeding: Secondary | ICD-10-CM | POA: Diagnosis not present

## 2024-02-21 DIAGNOSIS — M549 Dorsalgia, unspecified: Secondary | ICD-10-CM | POA: Diagnosis not present

## 2024-02-21 DIAGNOSIS — B351 Tinea unguium: Secondary | ICD-10-CM | POA: Diagnosis not present

## 2024-02-21 DIAGNOSIS — E538 Deficiency of other specified B group vitamins: Secondary | ICD-10-CM | POA: Diagnosis not present

## 2024-02-21 DIAGNOSIS — R41841 Cognitive communication deficit: Secondary | ICD-10-CM | POA: Diagnosis not present

## 2024-02-21 LAB — GLUCOSE, CAPILLARY: Glucose-Capillary: 388 mg/dL — ABNORMAL HIGH (ref 70–99)

## 2024-02-21 MED ORDER — METHYLPREDNISOLONE 4 MG PO TBPK: ORAL_TABLET | ORAL | Status: DC

## 2024-02-21 MED ORDER — OXYCODONE HCL 5 MG PO TABS: 5.0000 mg | ORAL_TABLET | ORAL | 0 refills | Status: AC | PRN

## 2024-02-21 NOTE — TOC Transition Note (Signed)
 Transition of Care [facility] - Discharge Note   Patient Details  Name: Olivia Romero MRN: [id number] Date of Birth: Jul 14, 1955  [facility] CM/SW Contact:  Jeoffrey LITTIE Maranda [place] Phone Number: 02/21/2024, 2:38 PM   Clinical Narrative:    Patient will DC to: [facility] Anticipated DC date: 02/21/24 Family notified: No Transport by: [facility]   Per MD patient ready for DC to [facility] RN to call report prior to discharge [phone] Room 129B. RN, patient,and facility notified of DC. Pt stated she would contact her family and tell them she is being discharged. Discharge Summary and FL2 sent to facility. DC packet on chart. Ambulance transport requested for patient.   CSW will sign off for now as social work intervention is no longer needed. Please consult us  again if new needs arise.     Final next level of care: Skilled Nursing Facility Barriers to Discharge: Barriers Resolved   Patient Goals and CMS Choice Patient states their goals for this hospitalization and ongoing recovery are:: SNF          Discharge Placement   Existing PASRR number confirmed : 02/21/24          Patient [facility] bed at: [facility] Patient to be transferred to facility by: [facility]   Patient and family notified of of transfer: 02/21/24  Discharge Plan and Services Additional resources added to the After Visit Summary for                                       Social Drivers of Health (SDOH) Interventions SDOH Screenings   Food Insecurity: No Food Insecurity (02/06/2024)  Housing: Low Risk  (02/06/2024)  Transportation Needs: No Transportation Needs (02/06/2024)  Utilities: Not At Risk (02/06/2024)  Alcohol Screen: Low Risk  (10/27/2022)  Depression (PHQ2-9): Low Risk  (01/15/2023)  Financial Resource Strain: Low Risk  (10/27/2022)  Physical Activity: Sufficiently Active (10/27/2022)  Social Connections: Socially Integrated (02/06/2024)  Stress: No Stress Concern  Present (10/27/2022)  Tobacco Use: Low Risk  (02/06/2024)     Readmission Risk Interventions     No data to display

## 2024-02-21 NOTE — Inpatient Diabetes Management (Signed)
 Inpatient Diabetes Program Recommendations  AACE/ADA: New Consensus Statement on Inpatient Glycemic Control (2015)  Target Ranges:  Prepandial:   less than 140 mg/dL      Peak postprandial:   less than 180 mg/dL (1-2 hours)      Critically ill patients:  140 - 180 mg/dL   Lab Results  Component Value Date   GLUCAP 388 (H) 02/21/2024   HGBA1C 8.3 (H) 01/28/2024    Latest Reference Range & Units 02/18/24 08:56 02/18/24 20:53 02/19/24 07:43 02/19/24 20:56 02/20/24 08:32 02/20/24 17:44 02/20/24 21:48 02/21/24 09:54  Glucose-Capillary 70 - 99 mg/dL 854 (H) 792 (H) 893 (H) 243 (H) 231 (H) 237 (H) 311 (H) 388 (H)  (H): Data is abnormally high  Diabetes history: DM2 Tresiba  24 units daily Novolog  8 units am and hs Metformin  500 mg tid ac meals Current orders for Inpatient glycemic control: Lantus  24 units daily Novolog  8 units am and hs Decadron  4 mg q 8 hrs.  Inpatient Diabetes Program Recommendations:   Please consider: -Change Novolog  meal coverage to 8 units tid ac meals -Add Novolog  0-9 units tid, 0-5 units hs correction  Thank you, Lequisha Cammack E. Denym Christenberry, RN, MSN, CNS, CDCES  Diabetes Coordinator Inpatient Glycemic Control Team Team Pager [phone] (8am-5pm) 02/21/2024 10:21 AM

## 2024-02-21 NOTE — Progress Notes (Signed)
 Olivia Romero Rake to be D/C'd  per MD order.  Discussed with the patient and all questions fully answered.  VSS, Skin clean, dry and intact without evidence of skin break down, no evidence of skin tears noted.  An After Visit Summary was printed and put in folder to be given to [facility]. Patient received prescription. Report called and given to Samantha, RN.  D/c education completed with patient/family including follow up instructions, medication list, d/c activities limitations if indicated, with other d/c instructions as indicated by MD - patient able to verbalize understanding, all questions fully answered.   Patient instructed to return to ED, call 911, or call MD for any changes in condition.   Patient to be transport via [facility].

## 2024-02-21 NOTE — Discharge Summary (Signed)
 Physician Discharge Summary     Providing Compassionate, Quality Care - Together   Patient ID: Olivia Romero MRN: [id number] DOB/AGE: 1955-09-14 68 y.o.  Admit date: 02/06/2024 Discharge date: 02/21/2024  Admission Diagnoses:  Lumbar adjacent segment disease with spondylolisthesis  Discharge Diagnoses:  Principal Problem:   Lumbar adjacent segment disease with spondylolisthesis   Discharged Condition: good  Hospital Course: Patient underwent an L3-4 fusion by Dr. Gillie on 02/06/2024. She was admitted to 6[facility] following recovery from anesthesia in the PACU. Her postoperative course has been uncomplicated. She has worked with both physical and occupational therapies who feel the patient is ready for discharge to a rehab facility. She is ambulating with assistance and the aid of a walker. She is tolerating a normal diet. She is not having any bowel or bladder dysfunction. Her pain is well-controlled with oral pain medication. She is ready for discharge home.   Consults: PT/OT/TOC  Significant Diagnostic Studies: radiology: DG FEMUR PORT, MIN 2 VIEWS RIGHT Result Date: 02/13/2024 EXAM: 2 VIEW(S) XRAY OF THE RIGHT FEMUR 02/13/2024 08:20:00 AM COMPARISON: Hip radiograph 12/03/2023 CLINICAL HISTORY: Pain in right femur FINDINGS: BONES AND JOINTS: Osseous overgrowth of the right acetabulum with right hip joint space narrowing compatible with moderate degenerative changes. Osteophytes of the right iliac crest and greater trochanter. Degenerative changes of the right knee with marginal osteophytosis and medial femorotibial predominant joint space narrowing. Indeterminate deformity of the proximal right fibula. No acute fracture. SOFT TISSUES: The soft tissues are unremarkable. IMPRESSION: 1. Moderate degenerative changes in the right hip and right knee. 2. Age-indeterminate deformity of the proximal right fibula. Electronically signed by: Donnice Mania MD 02/13/2024 10:36 AM EDT RP Workstation:  [id number]   DG Lumbar Spine 1 View Result Date: 02/06/2024 CLINICAL DATA:  [id number] Surgery, elective [id number] EXAM: LUMBAR SPINE - 1 VIEW COMPARISON:  12/12/2023 FINDINGS: Portable cross-table lateral view of the lumbar spine submitted from the operating room. The previous L5 pedicle screws are no longer seen. In L4-L5 interbody spacer remains in place. Posterior rod and pedicle screw fixation L3-L4 with interbody spacer. Surgical instruments posteriorly. IMPRESSION: Intraoperative localization. Electronically Signed   By: Andrea Gasman M.D.   On: 02/06/2024 16:53   DG Lumbar Spine 2-3 Views Result Date: 02/06/2024 CLINICAL DATA:  Elective surgery. EXAM: LUMBAR SPINE - 2-3 VIEW COMPARISON:  Radiograph 12/12/2023 FINDINGS: Seven fluoroscopic spot views of the lumbar spine submitted from the operating room. L5 pedicle screws are no longer seen. New pedicle screws at L3 with L3-L4 interbody spacer. Fluoroscopy time 1 minutes 17 seconds. Dose 65.74 mGy. IMPRESSION: Intraoperative fluoroscopy during lumbar surgery. Electronically Signed   By: Andrea Gasman M.D.   On: 02/06/2024 16:52   DG C-Arm 1-60 Min-No Report Result Date: 02/06/2024 Fluoroscopy was utilized by the requesting physician.  No radiographic interpretation.   DG C-Arm 1-60 Min-No Report Result Date: 02/06/2024 Fluoroscopy was utilized by the requesting physician.  No radiographic interpretation.   DG C-Arm 1-60 Min-No Report Result Date: 02/06/2024 Fluoroscopy was utilized by the requesting physician.  No radiographic interpretation.   XR Lumbar Spine 2-3 Views Result Date: 12/12/2023 2 views of the lumbar spine show previous L4-L5 fusion with arthritic changes.  MR Lumbar Spine W Wo Contrast Result Date: 12/09/2023 CLINICAL DATA:  Spondylolisthesis. Chronic low back pain. Prior surgery. EXAM: MRI LUMBAR SPINE WITHOUT AND WITH CONTRAST TECHNIQUE: Multiplanar and multiecho pulse sequences of the lumbar spine were obtained without and with  intravenous contrast. CONTRAST:  7.5mL GADAVIST  GADOBUTROL  1 MMOL/ML  IV SOLN COMPARISON:  12/29/2021 FINDINGS: Segmentation:  Standard. Alignment:  Grade 1 retrolisthesis at L3-4 Vertebrae: Modic type 1 changes at L3-4. No acute abnormality. L4-5 PLIF. Conus medullaris and cauda equina: Conus extends to the L2 level. Conus and cauda equina appear normal. Paraspinal and other soft tissues: Negative Disc levels: T11-12: Mild disc degeneration without stenosis. T12-L1: Disc degeneration with small right subarticular protrusion. Unchanged moderate spinal canal and right neural foraminal stenosis. L1-L2: Disc space narrowing and desiccation. No spinal canal stenosis. No neural foraminal stenosis. L2-L3: Mild disc bulge with left-greater-than-right lateral recess narrowing. No spinal canal stenosis. No neural foraminal stenosis. L3-L4: Intermediate sized disc bulge with mild facet hypertrophy. Unchanged severe spinal canal stenosis. Unchanged moderate bilateral neural foraminal stenosis. L4-L5: PLIF. No spinal canal stenosis. No neural foraminal stenosis. L5-S1: Moderate facet hypertrophy. Mild disc bulge. No spinal canal stenosis. No neural foraminal stenosis. Visualized sacrum: Normal. IMPRESSION: 1. Unchanged severe spinal canal stenosis and moderate bilateral neural foraminal stenosis at L3-L4. 2. Unchanged moderate spinal canal and right neural foraminal stenosis at T12-L1. 3. L4-5 PLIF without residual spinal canal or neural foraminal stenosis. Electronically Signed   By: Franky Stanford M.D.   On: 12/09/2023 23:48     Treatments: surgery: posterior lumbar interbody arthrodesis lumbar three- lumbar four, hardware removal lumbar five, nonsegmental pedicle fixation lumbar three-lumbar four   Discharge Exam: Blood pressure (!) 148/60, pulse 99, temperature 97.9 F (36.6 C), temperature source Oral, resp. rate 18, height 4' 11.5 (1.511 m), weight 80.1 kg, SpO2 98%.  Alert and oriented x 4, sitting up in  chair PERRLA CN II-XII grossly intact MAE, LLE prosthesis Incision is clean, dry, and intact. No drainage noted.  Disposition: Discharge disposition: 01-Home or Self Care       Discharge Instructions     Call MD for:  difficulty breathing, headache or visual disturbances   Complete by: As directed    Call MD for:  hives   Complete by: As directed    Call MD for:  persistant nausea and vomiting   Complete by: As directed    Call MD for:  redness, tenderness, or signs of infection (pain, swelling, redness, odor or green/yellow discharge around incision site)   Complete by: As directed    Call MD for:  severe uncontrolled pain   Complete by: As directed    Diet - low sodium heart healthy   Complete by: As directed    Increase activity slowly   Complete by: As directed    No dressing needed   Complete by: As directed       Allergies as of 02/21/2024       Reactions   Egg-derived Products Swelling, Other (See Comments)   Angioedema   Atorvastatin Rash   Rash itching   Tirzepatide  Other (See Comments)   Influenza Virus Vaccine Swelling   Arm swelled (site of injection)   Latex Itching   Pravastatin Itching, Rash   Tape Rash, Other (See Comments)   Adhesive on foot pull off skin.  Ok to use paper tape.        Medication List     STOP taking these medications    predniSONE  10 MG (21) Tbpk tablet Commonly known as: STERAPRED UNI-PAK 21 TAB       TAKE these medications    acetaminophen  500 MG tablet Commonly known as: TYLENOL  Take 1,000 mg by mouth every 6 (six) hours as needed for mild pain (pain score 1-3).   albuterol  108 (  90 Base) MCG/ACT inhaler Commonly known as: VENTOLIN  HFA Inhale 2 puffs into the lungs every 4 (four) hours as needed for wheezing or shortness of breath.   aspirin  EC 325 MG tablet Take 1 tablet (325 mg total) by mouth daily.   Assurance Fitted Brief Large Misc Use 1 brief up to 6 times a day as needed for urinary incontinence    benzonatate  100 MG capsule Commonly known as: Tessalon  Perles Take 2 capsules (200 mg total) by mouth 3 (three) times daily as needed for cough.   colchicine  0.6 MG tablet Take 1 tablet (0.6 mg total) by mouth daily. What changed:  when to take this reasons to take this   Contour Next Monitor w/Device Kit as directed.   DULoxetine  60 MG capsule Commonly known as: CYMBALTA  Take 120 mg by mouth at bedtime.   ezetimibe  10 MG tablet Commonly known as: ZETIA  TAKE ONE TABLET (10 MG TOTAL) BY MOUTH DAILY.   fenofibrate  145 MG tablet Commonly known as: TRICOR  TAKE ONE TABLET (145 MG TOTAL) BY MOUTH DAILY.   furosemide  20 MG tablet Commonly known as: LASIX  Take 0.5-1 tablets (10-20 mg total) by mouth daily as needed. For severe fluid retention in lower extremity.  Take in am with potassium PRN   gabapentin  800 MG tablet Commonly known as: NEURONTIN  Take 800 mg by mouth 2 (two) times daily. What changed: Another medication with the same name was removed. Continue taking this medication, and follow the directions you see here.   hydrochlorothiazide  25 MG tablet Commonly known as: HYDRODIURIL  TAKE ONE TABLET (25 MG TOTAL) BY MOUTH DAILY.   insulin  aspart 100 UNIT/ML injection Commonly known as: novoLOG  Inject 8 Units into the skin in the morning and at bedtime.   insulin  degludec 200 UNIT/ML FlexTouch Pen Commonly known as: TRESIBA  Inject 24 Units into the skin at bedtime.   meclizine  25 MG tablet Commonly known as: ANTIVERT  Take 0.5-1 tablets (12.5-25 mg total) by mouth 3 (three) times daily as needed for dizziness (vertigo sx).   metFORMIN  500 MG 24 hr tablet Commonly known as: GLUCOPHAGE -XR Take 500 mg by mouth 3 (three) times daily.   methylPREDNISolone  4 MG Tbpk tablet Commonly known as: MEDROL  DOSEPAK follow package directions   MULTIVITAMIN GUMMIES WOMENS PO Take 1 tablet by mouth daily.   ondansetron  4 MG disintegrating tablet Commonly known as:  ZOFRAN -ODT Take 1 tablet (4 mg total) by mouth every 8 (eight) hours as needed for refractory nausea / vomiting.   oxyCODONE  5 MG immediate release tablet Commonly known as: Oxy IR/ROXICODONE  Take 1 tablet (5 mg total) by mouth every 4 (four) hours as needed for severe pain (pain score 7-10).   Ozempic  (0.25 or 0.5 MG/DOSE) 2 MG/3ML Sopn Generic drug: Semaglutide (0.25 or 0.5MG /DOS) Inject 0.5 mg into the skin every 7 (seven) days.   pantoprazole  40 MG tablet Commonly known as: PROTONIX  Take 1 tablet (40 mg total) by mouth daily. What changed:  when to take this reasons to take this   polyethylene glycol 17 g packet Commonly known as: MIRALAX  / GLYCOLAX  Take 17 g by mouth daily as needed for moderate constipation.   potassium chloride  SA 20 MEQ tablet Commonly known as: KLOR-CON  M Take 1 tablet (20 mEq total) by mouth daily as needed (take on days when taking lasix /furosemide ).   Qvar  RediHaler 80 MCG/ACT inhaler Generic drug: beclomethasone Inhale 2 puffs into the lungs 2 (two) times daily.   UltiCare Short Pen Needles 31G X 8 MM Misc Generic  drug: Insulin  Pen Needle               Discharge Care Instructions  (From admission, onward)           Start     Ordered   02/21/24 0000  No dressing needed        02/21/24 1300            Contact information for follow-up providers     Gillie Duncans, MD. Go on 02/26/2024.   Specialty: Neurosurgery Why: First post op appointment is on 02/26/2024 at 2:30 PM. Contact information: [address] [place] [address] [phone]              Contact information for after-discharge care     Destination     [facility] [place], [place] .   Service: Skilled Nursing Contact information: [address] [place]  [address] [phone]                     Signed: Gerard Beck, DNP, AGNP-C Nurse Practitioner  [facility] [address], [address], [place], [place] [address] P: [phone]    F: [phone]  02/21/2024, 1:04 PM

## 2024-02-21 NOTE — Discharge Instructions (Signed)
 Wound Care Your incision is covered with Dermabond. Do not pull on this if it begins to peel; it will come off on its own. Do not put any creams, lotions, or ointments on incision. You are fine to shower. Let water run over incision and pat dry.  Activity Walk each and every day, increasing distance each day. No lifting greater than 5 lbs.  Avoid excessive back motion. No driving for 2 weeks; may ride as a passenger locally.  Diet Resume your normal diet.   Return to Work Will be discussed at your follow up appointment.  Call Your Doctor If Any of These Occur Redness, drainage, or swelling at the wound.  Temperature greater than 101 degrees. Severe pain not relieved by pain medication. Incision starts to come apart.  Follow Up Appt Call [phone] today for appointment if you don't already have one or for any problems.

## 2024-02-21 NOTE — Plan of Care (Signed)
  Problem: Education: Goal: Knowledge of General Education information will improve Description: Including pain rating scale, medication(s)/side effects and non-pharmacologic comfort measures Outcome: Progressing   Problem: Health Behavior/Discharge Planning: Goal: Ability to manage health-related needs will improve Outcome: Progressing   Problem: Clinical Measurements: Goal: Will remain free from infection Outcome: Progressing   Problem: Activity: Goal: Risk for activity intolerance will decrease Outcome: Progressing   Problem: Nutrition: Goal: Adequate nutrition will be maintained Outcome: Progressing   Problem: Coping: Goal: Level of anxiety will decrease Outcome: Progressing   Problem: Elimination: Goal: Will not experience complications related to bowel motility Outcome: Progressing Goal: Will not experience complications related to urinary retention Outcome: Progressing   Problem: Safety: Goal: Ability to remain free from injury will improve Outcome: Progressing   Problem: Skin Integrity: Goal: Risk for impaired skin integrity will decrease Outcome: Progressing   Problem: Activity: Goal: Ability to tolerate increased activity will improve Outcome: Progressing Goal: Will remain free from falls Outcome: Progressing   Problem: Bowel/Gastric: Goal: Gastrointestinal status for postoperative course will improve Outcome: Progressing   Problem: Pain Management: Goal: Pain level will decrease Outcome: Progressing

## 2024-02-21 NOTE — Plan of Care (Signed)
  Problem: Education: Goal: Knowledge of General Education information will improve Description: Including pain rating scale, medication(s)/side effects and non-pharmacologic comfort measures Outcome: Progressing   Problem: Nutrition: Goal: Adequate nutrition will be maintained Outcome: Progressing   Problem: Elimination: Goal: Will not experience complications related to bowel motility Outcome: Progressing   Problem: Pain Managment: Goal: General experience of comfort will improve and/or be controlled Outcome: Progressing   Problem: Safety: Goal: Ability to remain free from injury will improve Outcome: Progressing   Problem: Activity: Goal: Ability to tolerate increased activity will improve Outcome: Progressing

## 2024-02-29 DIAGNOSIS — E1165 Type 2 diabetes mellitus with hyperglycemia: Secondary | ICD-10-CM | POA: Diagnosis not present

## 2024-03-04 ENCOUNTER — Ambulatory Visit: Admitting: Podiatry

## 2024-03-04 DIAGNOSIS — M14671 Charcot's joint, right ankle and foot: Secondary | ICD-10-CM

## 2024-03-04 DIAGNOSIS — E1149 Type 2 diabetes mellitus with other diabetic neurological complication: Secondary | ICD-10-CM

## 2024-03-04 DIAGNOSIS — L84 Corns and callosities: Secondary | ICD-10-CM | POA: Diagnosis not present

## 2024-03-04 DIAGNOSIS — B351 Tinea unguium: Secondary | ICD-10-CM

## 2024-03-04 DIAGNOSIS — M79674 Pain in right toe(s): Secondary | ICD-10-CM | POA: Diagnosis not present

## 2024-03-04 NOTE — Progress Notes (Signed)
 Subjective: Chief Complaint  Patient presents with   RFC    Rm11 Diabetic foot care     68 y.o. returns the office today for follow-up evaluation of calluses on her right foot as well as for elongated nails that she has difficulty trimming herself.  She has not seen any open lesions.  She recently had back surgery and she is currently in rehab as well.  She does not see any open lesions otherwise.  PCP: [name], PA-C Endocrinologist: Dr. Therisa Byars seen 10/30/2023 Last A1c was 8.3 on January 28, 2024  Objective: AAO 3, NAD DP/PT pulses palpable, CRT less than 3 seconds Left below-knee amputation Sensation absent with Gustabo Speed monofilament He also mildly hypertrophic dystrophic with yellow discoloration.  She has tenderness nails 1 through 5 on the right foot.   On the plantar lateral aspect the foot is a still some mild hyperkeratotic lesion noted without any underlying ulceration, drainage or any signs of infection.  There is no open lesions identified. There is a new preulcerative area noted submetatarsal 4 as pictured below.  There is no drainage or pus or any fluctuation or crepitation.  No malodor. Cavovarus foot type. 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  Continue moisturizer, offloading.  Charcot, right -Continue [place]  brace for now.  Awaiting new brace.   Preulcerative lesion submetatarsal 4 - This area is new and is pictured above.  Almost looks like a blister.  Recommend to keep the area clean and dry.  Betadine  was applied today followed by dressing.  Order was written for this as well.  She is also going to try to look at this daily. Monitor for any clinical signs or symptoms of  infection and directed to call the office immediately should any occur or go to the ER.  *Of note she reports a possible wound on her BKA stump.  Order was also written to have the wound care nurse evaluate this and/or follow-up with Dr. Harden.   Olivia Romero DPM

## 2024-03-24 ENCOUNTER — Telehealth: Payer: Self-pay | Admitting: Family Medicine

## 2024-03-24 NOTE — Telephone Encounter (Signed)
 Copied from CRM #[id number]. Topic: Appointments - Appointment Info/Confirmation >> Mar 24, 2024  1:28 PM Olivia Romero wrote: Patient/patient representative is calling for information regarding an appointment.  I made Olivia Romero a hospital follow up appointment for 03/26/24 with Dr. Glenard. She is in skilled rehab and their Social Worker called to make a hospital follow up within 7 days. Olivia Romero discharged from the hospital on 02/21/24 and will discharge from skilled rehab on 03/25/24. I wanted to let the clinic know why I scheduled a hospital follow up.

## 2024-03-26 ENCOUNTER — Encounter: Payer: Self-pay | Admitting: Family Medicine

## 2024-03-26 ENCOUNTER — Ambulatory Visit (INDEPENDENT_AMBULATORY_CARE_PROVIDER_SITE_OTHER): Admitting: Family Medicine

## 2024-03-26 VITALS — BP 134/72 | HR 99 | Resp 16 | Ht 59.0 in | Wt 176.0 lb

## 2024-03-26 DIAGNOSIS — E785 Hyperlipidemia, unspecified: Secondary | ICD-10-CM | POA: Diagnosis not present

## 2024-03-26 DIAGNOSIS — I152 Hypertension secondary to endocrine disorders: Secondary | ICD-10-CM

## 2024-03-26 DIAGNOSIS — I739 Peripheral vascular disease, unspecified: Secondary | ICD-10-CM | POA: Diagnosis not present

## 2024-03-26 DIAGNOSIS — E1159 Type 2 diabetes mellitus with other circulatory complications: Secondary | ICD-10-CM

## 2024-03-26 DIAGNOSIS — J4521 Mild intermittent asthma with (acute) exacerbation: Secondary | ICD-10-CM

## 2024-03-26 DIAGNOSIS — Z789 Other specified health status: Secondary | ICD-10-CM

## 2024-03-26 DIAGNOSIS — E11610 Type 2 diabetes mellitus with diabetic neuropathic arthropathy: Secondary | ICD-10-CM | POA: Diagnosis not present

## 2024-03-26 DIAGNOSIS — Z794 Long term (current) use of insulin: Secondary | ICD-10-CM | POA: Diagnosis not present

## 2024-03-26 DIAGNOSIS — E1140 Type 2 diabetes mellitus with diabetic neuropathy, unspecified: Secondary | ICD-10-CM | POA: Diagnosis not present

## 2024-03-26 DIAGNOSIS — E1169 Type 2 diabetes mellitus with other specified complication: Secondary | ICD-10-CM | POA: Diagnosis not present

## 2024-03-26 DIAGNOSIS — Z89512 Acquired absence of left leg below knee: Secondary | ICD-10-CM

## 2024-03-26 DIAGNOSIS — K2100 Gastro-esophageal reflux disease with esophagitis, without bleeding: Secondary | ICD-10-CM

## 2024-03-26 MED ORDER — HYDROCHLOROTHIAZIDE 25 MG PO TABS: 25.0000 mg | ORAL_TABLET | Freq: Every day | ORAL | 1 refills | Status: AC

## 2024-03-26 MED ORDER — ALBUTEROL SULFATE HFA 108 (90 BASE) MCG/ACT IN AERS: 2.0000 | INHALATION_SPRAY | RESPIRATORY_TRACT | 0 refills | Status: AC | PRN

## 2024-03-26 MED ORDER — FENOFIBRATE 145 MG PO TABS: 145.0000 mg | ORAL_TABLET | Freq: Every day | ORAL | 1 refills | Status: AC

## 2024-03-26 MED ORDER — EZETIMIBE 10 MG PO TABS: 10.0000 mg | ORAL_TABLET | Freq: Every day | ORAL | 1 refills | Status: AC

## 2024-03-26 NOTE — Progress Notes (Signed)
 Name: Olivia Romero   MRN: [id number]    DOB: 1956-01-17   Date:03/26/2024       Progress Note  Subjective  Chief Complaint  Chief Complaint  Patient presents with   Hospitalization Follow-up   Discussed the use of AI scribe software for clinical note transcription with the patient, who gave verbal consent to proceed.  History of Present Illness Olivia Romero is a 68 year old female with a history of lumbar surgery and diabetes who presents for follow-up after recent back surgery.  She underwent a posterior lumbar interbody arthrodesis from L3 to L4 and hardware removal from L5 with fixation on February 06, 2024. She was discharged from rehab yesterday and is currently experiencing drainage from the surgical incision site. She completed a course of ciprofloxacin , but drainage recurred after the course ended. Pain is reported at a level of 6, managed with extra strength Tylenol .  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 Her diabetes management has improved post-surgery, with her blood sugar levels being the lowest she can remember. She uses a Freestyle Libre CGM and recently restarted Ozempic  at a low dose. Her current diabetes medications include Tresiba  24 units, NovoLog  8 units before meals, metformin  500 mg three times a day, and Ozempic  0.25 mg. The A1c was 8.3% in August, down from 8.7% in February.  She has Charcot foot on the right with a history of calluses and an ulcer. She sees a podiatrist regularly and had a debridement on March 04, 2024. She is managing the ulcer with bandages and compression socks. She has peripheral arterial disease associated with diabetes but has not seen a vascular surgeon.  Her asthma has been well-controlled, and she has not used her albuterol  inhaler for several months. She also has a history of obstructive sleep apnea and uses a CPAP machine intermittently.  She has a  history of dyslipidemia and is intolerant to statins. She manages her cholesterol with fenofibrate  and ezetimibe . She also has hypertension, managed with hydrochlorothiazide  25 mg. She has not been using Lasix  recently.  She reports a history of a mini-stroke in 2002 with no lasting effects. She also has a history of morbid obesity, with a BMI slightly above 30, and is actively working on weight loss. She has lost weight recently and aims to reach a goal weight of under 170 pounds.    Patient Active Problem List   Diagnosis Date Noted   Lumbar adjacent segment disease with spondylolisthesis 02/06/2024   Aortic atherosclerosis 01/15/2023   History of diastolic dysfunction 04/18/2022   Mitral valve insufficiency 04/18/2022   Statin myopathy 11/23/2021   History of colitis 08/03/2021   Vitamin D  deficiency 07/12/2020   Chronic low back pain 07/12/2020   Sepsis without acute organ dysfunction (HCC) 12/29/2019   Cervical disc disorder 12/29/2019   Brachial radiculitis 12/29/2019   Allergy to statin medication 12/01/2019   History of below knee amputation, left (HCC) 07/30/2019   PAD (peripheral artery disease) 12/05/2018   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) 09/18/2018   Class 2 severe obesity with serious comorbidity and body mass index (BMI) of 35.0 to 35.9 in adult 11/01/2017   B12 deficiency 11/01/2017   Diabetic polyneuropathy associated with type 2 diabetes mellitus (HCC) 12/13/2015   Lumbar stenosis with neurogenic claudication 09/02/2015   Chronic constipation 07/13/2015   OSA (obstructive sleep apnea) 06/25/2015   Primary osteoarthritis involving multiple  joints 04/23/2015   Hyperlipidemia 04/23/2015   Statin intolerance 04/23/2015   Asthma, mild intermittent 02/01/2015   Type 2 diabetes mellitus with diabetic neuropathy, with long-term current use of insulin  (HCC) 12/31/2014   Gastroesophageal reflux disease with esophagitis 12/31/2014   Bilateral carpal tunnel  syndrome 12/01/2014   Diverticulosis of colon 11/30/2012   Benign essential hypertension 11/28/2012    Past Surgical History:  Procedure Laterality Date   ABDOMINAL HYSTERECTOMY     AMPUTATION Left 12/06/2018   Procedure: PARTIAL AMPUTATION LEFT FOOT;  Surgeon: Janit Thresa HERO, DPM;  Location: [facility] OR;  Service: Podiatry;  Laterality: Left;   AMPUTATION Left 05/16/2019   Procedure: LEFT BELOW KNEE AMPUTATION;  Surgeon: Harden Jerona GAILS, MD;  Location: [facility] OR;  Service: Orthopedics;  Laterality: Left;   APPENDECTOMY     removed in 11th grade   BACK SURGERY  09/02/2015   BONE BIOPSY Left 12/06/2018   Procedure: Bone Biopsy;  Surgeon: Janit Thresa HERO, DPM;  Location: [facility] OR;  Service: Podiatry;  Laterality: Left;   CERVICAL FUSION     CESAREAN SECTION  1986   COLONOSCOPY N/A 11/30/2012   Procedure: COLONOSCOPY;  Surgeon: Norleen LOISE Kiang, MD;  Location: [facility] ENDOSCOPY;  Service: Endoscopy;  Laterality: N/A;   COLONOSCOPY N/A 09/05/2021   Procedure: COLONOSCOPY;  Surgeon: Maryruth Ole DASEN, MD;  Location: [facility] ENDOSCOPY;  Service: Endoscopy;  Laterality: N/A;   COLONOSCOPY WITH PROPOFOL  N/A 01/03/2016   Procedure: COLONOSCOPY WITH PROPOFOL ;  Surgeon: Lamar DASEN Holmes, MD;  Location: [facility] ENDOSCOPY;  Service: Endoscopy;  Laterality: N/A;   ESOPHAGOGASTRODUODENOSCOPY N/A 09/05/2021   Procedure: ESOPHAGOGASTRODUODENOSCOPY (EGD);  Surgeon: Maryruth Ole DASEN, MD;  Location: [facility] ENDOSCOPY;  Service: Endoscopy;  Laterality: N/A;   ESOPHAGOGASTRODUODENOSCOPY (EGD) WITH PROPOFOL  N/A 01/14/2015   Procedure: ESOPHAGOGASTRODUODENOSCOPY (EGD) WITH PROPOFOL ;  Surgeon: Lamar DASEN Holmes, MD;  Location: [facility] ENDOSCOPY;  Service: Endoscopy;  Laterality: N/A;   IRRIGATION AND DEBRIDEMENT FOOT Left 03/13/2018   Procedure: IRRIGATION AND DEBRIDEMENT FOOT WITH BONE BIOPSY WITH MISONIX DEBRIDER;  Surgeon: Gretel Ozell PARAS, DPM;  Location: [facility] OR;  Service: Podiatry;  Laterality: Left;   IRRIGATION AND DEBRIDEMENT FOOT  Left 12/06/2018   Procedure: Irrigation And Debridement Foot;  Surgeon: Janit Thresa HERO, DPM;  Location: [facility] OR;  Service: Podiatry;  Laterality: Left;   LUMBAR WOUND DEBRIDEMENT N/A 10/01/2015   Procedure: LUMBAR WOUND DEBRIDEMENT;  Surgeon: Rockey Peru, MD;  Location: [facility] NEURO ORS;  Service: Neurosurgery;  Laterality: N/A;  LUMBAR WOUND DEBRIDEMENT   NASAL SINUS SURGERY     SAVORY DILATION N/A 01/14/2015   Procedure: SAVORY DILATION;  Surgeon: Lamar DASEN Holmes, MD;  Location: [facility] ENDOSCOPY;  Service: Endoscopy;  Laterality: N/A;   TONSILLECTOMY     WISDOM TOOTH EXTRACTION      Family History  Problem Relation Age of Onset   Lung cancer Father    Hypertension Father    Arthritis Father    Other Mother        hardening of the arteries/renal cell carcenoma   Hypertension Mother    Stroke Mother    Kidney cancer Mother    Arthritis Brother    Rheum arthritis Maternal Uncle    Bladder Cancer Neg Hx     Social History   Tobacco Use   Smoking status: Never   Smokeless tobacco: Never  Substance Use Topics   Alcohol use: Never     Current Outpatient Medications:    acetaminophen  (TYLENOL ) 500 MG tablet, Take 1,000 mg by  mouth every 6 (six) hours as needed for mild pain (pain score 1-3)., Disp: , Rfl:    albuterol  (VENTOLIN  HFA) 108 (90 Base) MCG/ACT inhaler, Inhale 2 puffs into the lungs every 4 (four) hours as needed for wheezing or shortness of breath., Disp: 6.7 g, Rfl: 0   aspirin  EC 325 MG tablet, Take 1 tablet (325 mg total) by mouth daily., Disp: 30 tablet, Rfl: 0   Blood Glucose Monitoring Suppl (CONTOUR NEXT MONITOR) w/Device KIT, as directed., Disp: , Rfl:    colchicine  0.6 MG tablet, Take 1 tablet (0.6 mg total) by mouth daily. (Patient taking differently: Take 0.6 mg by mouth daily as needed (gout flare).), Disp: 10 tablet, Rfl: 0   DULoxetine  (CYMBALTA ) 60 MG capsule, Take 120 mg by mouth at bedtime., Disp: , Rfl:    ezetimibe  (ZETIA ) 10 MG tablet, TAKE ONE TABLET (10  MG TOTAL) BY MOUTH DAILY., Disp: 90 tablet, Rfl: 0   fenofibrate  (TRICOR ) 145 MG tablet, TAKE ONE TABLET (145 MG TOTAL) BY MOUTH DAILY., Disp: 90 tablet, Rfl: 0   furosemide  (LASIX ) 20 MG tablet, Take 0.5-1 tablets (10-20 mg total) by mouth daily as needed. For severe fluid retention in lower extremity.  Take in am with potassium PRN, Disp: 10 tablet, Rfl: 0   gabapentin  (NEURONTIN ) 800 MG tablet, Take 800 mg by mouth 2 (two) times daily., Disp: , Rfl:    hydrochlorothiazide  (HYDRODIURIL ) 25 MG tablet, TAKE ONE TABLET (25 MG TOTAL) BY MOUTH DAILY., Disp: 90 tablet, Rfl: 0   Incontinence Supply Disposable (ASSURANCE FITTED BRIEF LARGE) MISC, Use 1 brief up to 6 times a day as needed for urinary incontinence, Disp: 180 each, Rfl: 5   insulin  aspart (NOVOLOG ) 100 UNIT/ML injection, Inject 8 Units into the skin in the morning and at bedtime., Disp: , Rfl:    insulin  degludec (TRESIBA ) 200 UNIT/ML FlexTouch Pen, Inject 24 Units into the skin at bedtime., Disp: , Rfl:    metFORMIN  (GLUCOPHAGE -XR) 500 MG 24 hr tablet, Take 500 mg by mouth 3 (three) times daily. , Disp: , Rfl:    Multiple Vitamins-Minerals (MULTIVITAMIN GUMMIES WOMENS PO), Take 1 tablet by mouth daily., Disp: , Rfl:    oxyCODONE  (OXY IR/ROXICODONE ) 5 MG immediate release tablet, Take 1 tablet (5 mg total) by mouth every 4 (four) hours as needed for severe pain (pain score 7-10)., Disp: 30 tablet, Rfl: 0   OZEMPIC , 0.25 OR 0.5 MG/DOSE, 2 MG/3ML SOPN, Inject 0.5 mg into the skin every 7 (seven) days., Disp: , Rfl:    pantoprazole  (PROTONIX ) 40 MG tablet, Take 1 tablet (40 mg total) by mouth daily. (Patient taking differently: Take 40 mg by mouth daily as needed (acid reflux).), Disp: 90 tablet, Rfl: 1   polyethylene glycol (MIRALAX  / GLYCOLAX ) 17 g packet, Take 17 g by mouth daily as needed for moderate constipation., Disp: , Rfl:    ULTICARE SHORT PEN NEEDLES 31G X 8 MM MISC, , Disp: , Rfl:   Allergies  Allergen Reactions   Egg  Protein-Containing Drug Products Swelling and Other (See Comments)    Angioedema   Atorvastatin Rash    Rash itching    Tirzepatide  Other (See Comments)   Influenza Virus Vaccine Swelling    Arm swelled (site of injection)   Latex Itching   Pravastatin Itching and Rash   Tape Rash and Other (See Comments)    Adhesive on foot pull off skin.  Ok to use paper tape.    I personally reviewed active  problem list, medication list, allergies with the patient/caregiver today.   ROS  Ten systems reviewed and is negative except as mentioned in HPI    Objective Physical Exam CONSTITUTIONAL: Patient appears well-developed and well-nourished. No distress. HEENT: Head atraumatic, normocephalic, neck supple. CARDIOVASCULAR: Normal rate, regular rhythm and normal heart sounds. No murmur heard. No BLE edema. No significant swelling in the extremities. PULMONARY: Effort normal and breath sounds normal. No respiratory distress. ABDOMINAL: There is no tenderness or distention. MUSCULOSKELETAL: Normal gait. Without gross motor or sensory deficit. PSYCHIATRIC: Patient has a normal mood and affect. Behavior is normal. Judgment and thought content normal.  Vitals:   03/26/24 1355  BP: 134/72  Pulse: 99  Resp: 16  SpO2: 99%  Weight: 176 lb (79.8 kg)  Height: 4' 11 (1.499 m)    Body mass index is 35.55 kg/m.    PHQ2/9:    03/26/2024    1:59 PM 01/15/2023    2:17 PM 12/18/2022    8:26 AM 10/27/2022    9:17 AM 09/29/2022   11:10 AM  Depression screen PHQ 2/9  Decreased Interest 0 0 0 0 0  Down, Depressed, Hopeless 0 0 0 0 0  PHQ - 2 Score 0 0 0 0 0  Altered sleeping  0 0 0 0  Tired, decreased energy  0 0 0 0  Change in appetite  0 0 0 0  Feeling bad or failure about yourself   0 0 0 0  Trouble concentrating  0 0 0 0  Moving slowly or fidgety/restless  0 0 0 0  Suicidal thoughts  0 0 0 0  PHQ-9 Score  0 0 0 0  Difficult doing work/chores  Not difficult at all Not difficult at all Not  difficult at all Not difficult at all    phq 9 is negative  Fall Risk:    01/15/2023    2:16 PM 12/18/2022    8:26 AM 10/27/2022    9:22 AM 09/29/2022   11:10 AM 04/19/2022   10:53 AM  Fall Risk   Falls in the past year? 0 0 0 0 1  Number falls in past yr: 0 0 0 0 0  Injury with Fall? 0 0 0 0 0  Risk for fall due to : No Fall Risks No Fall Risks No Fall Risks No Fall Risks Impaired balance/gait  Follow up Falls prevention discussed;Education provided;Falls evaluation completed Falls prevention discussed;Education provided;Falls evaluation completed Falls prevention discussed;Falls evaluation completed Falls prevention discussed;Education provided;Falls evaluation completed Falls prevention discussed;Education provided;Falls evaluation completed      Data saved with a previous flowsheet row definition    Assessment & Plan Postoperative lumbar wound with drainage after lumbar fusion Postoperative lumbar wound drainage persists despite ciprofloxacin . Awaiting culture results for targeted antibiotic therapy. - Follow up with surgeon for wound assessment. - Await culture results before initiating new antibiotics.  Type 2 diabetes mellitus with complications (Charcot foot, peripheral arterial disease, left below-knee amputation) Type 2 diabetes with complications. Blood glucose improved post-surgery. A1c decreased from 8.7% to 8.3%. - Continue Tresiba , NovoLog , metformin , and Ozempic . - Monitor blood glucose with Freestyle Libre CGM. - Follow up with endocrinologist in November.  Right Charcot foot with pre-ulcerative lesion and onychomycosis Right Charcot foot with pre-ulcerative lesion and onychomycosis managed by podiatrist. - Continue podiatrist care. - Maintain bandaging and moisture of lesion. - Use compression socks as advised.  Peripheral arterial disease Peripheral arterial disease related to diabetes, contributing to amputation. No vascular surgeon  consultation  yet.  Hypertension Hypertension managed with hydrochlorothiazide . Lasix  avoided due to potassium concerns. - Refill hydrochlorothiazide  for six months. - Monitor blood pressure regularly.  Dyslipidemia associated with DM type 2  Dyslipidemia managed with fenofibrate  and ezetimibe . Statin intolerant. - Refill fenofibrate  and ezetimibe  for six months.  Obesity, BMI > 35 with co-morbidities ( morbid obesity)  Obesity with BMI > 35. Weight loss efforts ongoing. Ozempic  to aid weight loss. - Continue Ozempic . - Encourage portion control and reduced carbohydrate intake.  Asthma, intermittent Intermittent asthma well-controlled with albuterol . Inhaler unused for months. - Refill albuterol  inhaler for as-needed use.  Obstructive sleep apnea, nonadherent to CPAP Obstructive sleep apnea with CPAP nonadherence. Acknowledged need for CPAP use. - Encourage regular CPAP use.  General Health Maintenance General health maintenance includes vaccinations, screenings, and wellness checks. Declined flu shot. Due for mammogram, eye exam, and wellness visit. - Encourage scheduling mammogram and eye exam. - Discuss flu vaccine benefits and encourage reconsideration. - Schedule Medicare wellness visit, potentially by phone.

## 2024-03-26 NOTE — Addendum Note (Signed)
 Addended by: GLENARD MIRE F on: 03/26/2024 02:56 PM   Modules accepted: Level of Service

## 2024-03-27 ENCOUNTER — Other Ambulatory Visit: Payer: Self-pay | Admitting: Neurosurgery

## 2024-03-27 DIAGNOSIS — M48062 Spinal stenosis, lumbar region with neurogenic claudication: Secondary | ICD-10-CM

## 2024-03-30 DIAGNOSIS — E1165 Type 2 diabetes mellitus with hyperglycemia: Secondary | ICD-10-CM | POA: Diagnosis not present

## 2024-04-01 DIAGNOSIS — Z7984 Long term (current) use of oral hypoglycemic drugs: Secondary | ICD-10-CM | POA: Diagnosis not present

## 2024-04-01 DIAGNOSIS — Z794 Long term (current) use of insulin: Secondary | ICD-10-CM | POA: Diagnosis not present

## 2024-04-01 DIAGNOSIS — E1165 Type 2 diabetes mellitus with hyperglycemia: Secondary | ICD-10-CM | POA: Diagnosis not present

## 2024-04-01 DIAGNOSIS — E785 Hyperlipidemia, unspecified: Secondary | ICD-10-CM | POA: Diagnosis not present

## 2024-04-01 DIAGNOSIS — Z7985 Long-term (current) use of injectable non-insulin antidiabetic drugs: Secondary | ICD-10-CM | POA: Diagnosis not present

## 2024-04-01 DIAGNOSIS — Z7982 Long term (current) use of aspirin: Secondary | ICD-10-CM | POA: Diagnosis not present

## 2024-04-01 DIAGNOSIS — Z556 Problems related to health literacy: Secondary | ICD-10-CM | POA: Diagnosis not present

## 2024-04-01 DIAGNOSIS — E1140 Type 2 diabetes mellitus with diabetic neuropathy, unspecified: Secondary | ICD-10-CM | POA: Diagnosis not present

## 2024-04-01 DIAGNOSIS — Z4789 Encounter for other orthopedic aftercare: Secondary | ICD-10-CM | POA: Diagnosis not present

## 2024-04-01 DIAGNOSIS — M48061 Spinal stenosis, lumbar region without neurogenic claudication: Secondary | ICD-10-CM | POA: Diagnosis not present

## 2024-04-01 DIAGNOSIS — Z981 Arthrodesis status: Secondary | ICD-10-CM | POA: Diagnosis not present

## 2024-04-02 ENCOUNTER — Ambulatory Visit
Admission: RE | Admit: 2024-04-02 | Discharge: 2024-04-02 | Disposition: A | Discharge status: Home / Self Care | Source: Ambulatory Visit | Attending: Neurosurgery | Admitting: Neurosurgery

## 2024-04-02 ENCOUNTER — Telehealth: Payer: Self-pay

## 2024-04-02 DIAGNOSIS — M48062 Spinal stenosis, lumbar region with neurogenic claudication: Secondary | ICD-10-CM | POA: Diagnosis not present

## 2024-04-02 DIAGNOSIS — M48061 Spinal stenosis, lumbar region without neurogenic claudication: Secondary | ICD-10-CM | POA: Diagnosis not present

## 2024-04-02 DIAGNOSIS — M4805 Spinal stenosis, thoracolumbar region: Secondary | ICD-10-CM | POA: Diagnosis not present

## 2024-04-02 MED ORDER — GADOBUTROL 1 MMOL/ML IV SOLN
7.0000 mL | Freq: Once | INTRAVENOUS | Status: AC | PRN
  Administered 2024-04-02: 7 mL via INTRAVENOUS

## 2024-04-02 NOTE — Telephone Encounter (Signed)
 Copied from CRM #[id number]. Topic: Clinical - Home Health Verbal Orders >> Apr 01, 2024  4:22 PM Winona SAUNDERS wrote: Caller/Agency:Cecilia RN [facility] with a pt Callback Number: [phone] Service Requested: Skilled Nursing, Physical Therapy eval  Frequency: 2x week for 4 week, 1x week for 4 weeks- For wound  Any new concerns about the patient?

## 2024-04-02 NOTE — Telephone Encounter (Signed)
 Verbal given

## 2024-04-03 NOTE — Progress Notes (Signed)
 Olivia Romero                                          MRN: [id number]   04/03/2024   The [facility] Quality Team Specialist reviewed this patient medical record for the purposes of chart review for care gap closure. The following were reviewed: abstraction for care gap closure-controlling blood pressure.    [facility] Quality Team

## 2024-04-04 ENCOUNTER — Ambulatory Visit: Admitting: Podiatry

## 2024-04-07 ENCOUNTER — Encounter: Payer: Self-pay | Admitting: Radiology

## 2024-04-07 ENCOUNTER — Telehealth: Payer: Self-pay

## 2024-04-07 NOTE — Telephone Encounter (Signed)
 Copied from CRM [id number]. Topic: Clinical - Home Health Verbal Orders >> Apr 07, 2024  1:50 PM Winona R wrote: Caller/Agency: [facility]- Occupational [facility] Callback Number: [phone] Service Requested: Occupational Therapy Frequency: 2 week 2. 1 week 1  Any new concerns about the patient? No

## 2024-04-07 NOTE — Telephone Encounter (Signed)
 VO given.

## 2024-04-08 ENCOUNTER — Other Ambulatory Visit (HOSPITAL_COMMUNITY): Payer: Self-pay

## 2024-04-10 ENCOUNTER — Other Ambulatory Visit (HOSPITAL_COMMUNITY): Payer: Self-pay

## 2024-04-14 ENCOUNTER — Telehealth: Payer: Self-pay

## 2024-04-14 NOTE — Telephone Encounter (Signed)
 Copied from CRM [id number]. Topic: Clinical - Home Health Verbal Orders >> Apr 14, 2024  1:13 PM Donna BRAVO wrote: Caller/Agency:  [facility] ([phone] [facility]  Callback Number: [phone]  Service Requested: Physical Therapy Frequency: 1 time week for 8 weeks Any new concerns about the patient? No

## 2024-04-15 NOTE — Telephone Encounter (Signed)
 VO given.

## 2024-04-30 DIAGNOSIS — E1165 Type 2 diabetes mellitus with hyperglycemia: Secondary | ICD-10-CM | POA: Diagnosis not present

## 2024-05-07 DIAGNOSIS — Z89512 Acquired absence of left leg below knee: Secondary | ICD-10-CM | POA: Diagnosis not present

## 2024-05-07 DIAGNOSIS — E1165 Type 2 diabetes mellitus with hyperglycemia: Secondary | ICD-10-CM | POA: Diagnosis not present

## 2024-05-07 DIAGNOSIS — E559 Vitamin D deficiency, unspecified: Secondary | ICD-10-CM | POA: Diagnosis not present

## 2024-05-07 DIAGNOSIS — R809 Proteinuria, unspecified: Secondary | ICD-10-CM | POA: Diagnosis not present

## 2024-05-07 DIAGNOSIS — E113293 Type 2 diabetes mellitus with mild nonproliferative diabetic retinopathy without macular edema, bilateral: Secondary | ICD-10-CM | POA: Diagnosis not present

## 2024-05-07 DIAGNOSIS — E1142 Type 2 diabetes mellitus with diabetic polyneuropathy: Secondary | ICD-10-CM | POA: Diagnosis not present

## 2024-05-07 DIAGNOSIS — E1129 Type 2 diabetes mellitus with other diabetic kidney complication: Secondary | ICD-10-CM | POA: Diagnosis not present

## 2024-05-07 DIAGNOSIS — Z794 Long term (current) use of insulin: Secondary | ICD-10-CM | POA: Diagnosis not present

## 2024-05-07 DIAGNOSIS — Z789 Other specified health status: Secondary | ICD-10-CM | POA: Diagnosis not present

## 2024-05-07 DIAGNOSIS — E781 Pure hyperglyceridemia: Secondary | ICD-10-CM | POA: Diagnosis not present

## 2024-05-08 ENCOUNTER — Telehealth: Payer: Self-pay

## 2024-05-08 NOTE — Telephone Encounter (Signed)
 Copied from CRM #[id number]. Topic: General - Call Back - No Documentation >> May 08, 2024  3:04 PM Geneva B wrote: Reason for CRM: [facility] pt is wheezing as well as a cough and blood sugar is 219 please call cecila [phone] if any questions

## 2024-05-09 NOTE — Telephone Encounter (Signed)
 Called and spoke to pt. Offered her an appt. She states she's not sure she can get a ride here. She proceeded to tell me taht she doesn't feel bad and she doesn't feel like she is wheezing. Pt wanted to wait to see how things go this weekend and if she needs help she will go to [facility]

## 2024-05-13 ENCOUNTER — Telehealth: Payer: Self-pay

## 2024-05-13 NOTE — Telephone Encounter (Signed)
 Pharmacy Quality Measure Review  This patient is appearing on a report for being at risk of failing the Glycemic Status Assessment in Diabetes measure this calendar year.   Last documented A1c 8.4% on 04/01/24  Patient is regularly followed by [facility] with follow up on 07/09/24. Scheduled visit with clinical pharmacist in the interim on 06/16/24.  [name] E. Marsh, PharmD, CPP Clinical Pharmacist [facility] [phone]

## 2024-05-19 ENCOUNTER — Ambulatory Visit: Admitting: Internal Medicine

## 2024-05-19 ENCOUNTER — Other Ambulatory Visit (HOSPITAL_COMMUNITY)
Admission: RE | Admit: 2024-05-19 | Discharge: 2024-05-19 | Disposition: A | Source: Ambulatory Visit | Attending: Internal Medicine | Admitting: Internal Medicine

## 2024-05-19 ENCOUNTER — Encounter: Payer: Self-pay | Admitting: Internal Medicine

## 2024-05-19 VITALS — BP 122/70 | HR 98 | Resp 16 | Ht 59.0 in | Wt 183.0 lb

## 2024-05-19 DIAGNOSIS — R350 Frequency of micturition: Secondary | ICD-10-CM

## 2024-05-19 DIAGNOSIS — R102 Pelvic and perineal pain unspecified side: Secondary | ICD-10-CM | POA: Diagnosis not present

## 2024-05-19 DIAGNOSIS — N949 Unspecified condition associated with female genital organs and menstrual cycle: Secondary | ICD-10-CM

## 2024-05-19 DIAGNOSIS — M545 Low back pain, unspecified: Secondary | ICD-10-CM

## 2024-05-19 LAB — POCT URINALYSIS DIPSTICK
Bilirubin, UA: NEGATIVE
Glucose, UA: POSITIVE — AB
Ketones, UA: NEGATIVE
Nitrite, UA: NEGATIVE
Protein, UA: POSITIVE — AB
Spec Grav, UA: 1.02 (ref 1.010–1.025)
Urobilinogen, UA: 0.2 U/dL
pH, UA: 5 (ref 5.0–8.0)

## 2024-05-19 NOTE — Progress Notes (Signed)
 Acute Office Visit  Subjective:     Patient ID: Olivia Romero, female    DOB: 1955-09-29, 68 y.o.   MRN: [id number]  Chief Complaint  Patient presents with   Urinary Frequency   Back Pain    Low back pain    Urinary Frequency  Associated symptoms include flank pain and frequency. Pertinent negatives include no chills, hematuria or urgency.  Back Pain Pertinent negatives include no abdominal pain, dysuria or fever.   Patient is in today for urinary frequency and low back pain.  Discussed the use of AI scribe software for clinical note transcription with the patient, who gave verbal consent to proceed.  History of Present Illness Olivia Romero is a 68 year old female with diabetes who presents with urinary symptoms and back pain. She is accompanied by a friend who had a similar medical condition.  She underwent back surgery on February 06, 2024, followed by rehabilitation. During her hospitalization she had a sacroiliac region infection that was treated with antibiotics and resolved. Since then she has worsening right-sided back pain that interferes with sleep despite an October MRI that showed no new issues. She is in therapy with exercises and walking.  She has markedly increased urinary frequency, voiding every thirty minutes with large volumes, and reports itching and a urine-like odor. She has been increasing oral fluids. A recent urine test showed blood, leukocytes, protein, and sugar, with negative nitrites. She has diabetes.  Her back pain can become severe enough that it takes her breath away. She is using Tylenol  and avoids using prescribed oxycodone . She reports waking up soaked at night, suggesting night sweats, but denies recent fevers. A nurse recently noted wheezing on her left side, although she has not had significant respiratory symptoms.   Review of Systems  Constitutional:  Negative for chills and fever.  Gastrointestinal:  Negative for abdominal pain.   Genitourinary:  Positive for flank pain and frequency. Negative for dysuria, hematuria and urgency.  Musculoskeletal:  Positive for back pain.        Objective:    BP 122/70 (Cuff Size: Large)   Pulse 98   Resp 16   Ht 4' 11 (1.499 m)   Wt 183 lb (83 kg)   LMP  (LMP Unknown)   SpO2 98%   BMI 36.96 kg/m  BP Readings from Last 3 Encounters:  05/19/24 122/70  03/26/24 134/72  02/21/24 (!) 148/60   Wt Readings from Last 3 Encounters:  05/19/24 183 lb (83 kg)  03/26/24 176 lb (79.8 kg)  02/12/24 176 lb 9.6 oz (80.1 kg)      Physical Exam Constitutional:      Appearance: Normal appearance.  HENT:     Head: Normocephalic and atraumatic.  Eyes:     Conjunctiva/sclera: Conjunctivae normal.  Cardiovascular:     Rate and Rhythm: Normal rate and regular rhythm.  Pulmonary:     Effort: Pulmonary effort is normal.     Breath sounds: Normal breath sounds.  Abdominal:     Tenderness: There is right CVA tenderness. There is no left CVA tenderness.  Skin:    General: Skin is warm and dry.  Neurological:     General: No focal deficit present.     Mental Status: She is alert. Mental status is at baseline.  Psychiatric:        Mood and Affect: Mood normal.        Behavior: Behavior normal.     Results for orders  placed or performed in visit on 05/19/24  POCT urinalysis dipstick  Result Value Ref Range   Color, UA yellow    Clarity, UA clear    Glucose, UA Positive (A) Negative   Bilirubin, UA negative    Ketones, UA negative    Spec Grav, UA 1.020 1.010 - 1.025   Blood, UA moderate    pH, UA 5.0 5.0 - 8.0   Protein, UA Positive (A) Negative   Urobilinogen, UA 0.2 0.2 or 1.0 E.U./dL   Nitrite, UA negative    Leukocytes, UA Large (3+) (A) Negative   Appearance clear    Odor none         Assessment & Plan:   Assessment & Plan Acute low back pain following back surgery Persistent post-surgical low back pain with recent exacerbation, worse on the right side,  affecting sleep. MRI showed no new findings. Differential includes inflammation or possible infection. No indication for antibiotics based on urine analysis. - Continue physical therapy and exercise regimen. - Avoid oxycodone  unless necessary for severe pain.  Urinary frequency and vaginal discomfort Increased urinary frequency and vaginal discomfort with itching and odor. Urinalysis suggests possible early infection or inflammation. Differential includes UTI, yeast infection, or bacterial vaginosis. Awaiting urine culture results. - Sent urine for culture. - Performed vaginal swab to check for yeast or bacterial vaginosis. - Will consider antibiotics based on culture results.  General health maintenance Pneumonia vaccine status confirmed as up to date as of 2022.  - POCT urinalysis dipstick - Urine Culture - Cervicovaginal ancillary only   Return if symptoms worsen or fail to improve.  Sharyle Fischer, DO

## 2024-05-20 ENCOUNTER — Ambulatory Visit: Payer: Self-pay | Admitting: Internal Medicine

## 2024-05-20 DIAGNOSIS — B379 Candidiasis, unspecified: Secondary | ICD-10-CM

## 2024-05-20 LAB — CERVICOVAGINAL ANCILLARY ONLY
Bacterial Vaginitis (gardnerella): NEGATIVE
Candida Glabrata: POSITIVE — AB
Candida Vaginitis: POSITIVE — AB
Chlamydia: NEGATIVE
Comment: NEGATIVE
Comment: NEGATIVE
Comment: NEGATIVE
Comment: NEGATIVE
Comment: NEGATIVE
Comment: NORMAL
Neisseria Gonorrhea: NEGATIVE
Trichomonas: NEGATIVE

## 2024-05-20 LAB — URINE CULTURE
MICRO NUMBER:: 17356590
SPECIMEN QUALITY:: ADEQUATE

## 2024-05-20 MED ORDER — FLUCONAZOLE 150 MG PO TABS: 150.0000 mg | ORAL_TABLET | Freq: Once | ORAL | 0 refills | Status: AC

## 2024-05-30 ENCOUNTER — Ambulatory Visit: Payer: Self-pay | Admitting: Internal Medicine

## 2024-05-30 NOTE — Telephone Encounter (Signed)
 FYI Only or Action Required?: Action required by provider: clinical question for provider and update on patient condition.  Patient was last seen in primary care on 05/19/2024 by Bernardo Fend, DO.  Called Nurse Triage reporting No chief complaint on file..  Symptoms began several days ago.  Interventions attempted: Rest, hydration, or home remedies.  Symptoms are: unchanged.  Triage Disposition: No disposition on file.  Patient/caregiver understands and will follow disposition?:            Copied from CRM [id number]. Topic: Clinical - Red Word Triage >> May 30, 2024 10:24 AM Ashley R wrote: Red Word that prompted transfer to Nurse Triage: pain in lower back / kidneys, finished round of anitbiotics, UTI symptoms still occurring Reason for Disposition  Urinating more frequently than usual (i.e., frequency) OR new-onset of the feeling of an urgent need to urinate (i.e., urgency)  Answer Assessment - Initial Assessment Questions 1. SYMPTOM: What's the main symptom you're concerned about? (e.g., frequency, incontinence)      Ongoing since before 12/15 office visit.    2. ONSET: When did the  symptoms   start?     Urinary frequency, odor, flank pain    3. PAIN: Is there any pain? If Yes, ask: How bad is it? (Scale: 1-10; mild, moderate, severe)     No    4. CAUSE: What do you think is causing the symptoms?     Untreated UTI    5. OTHER SYMPTOMS: Do you have any other symptoms? (e.g., blood in urine, fever, flank pain, pain with urination)    Flank pain    Patient called in to triage with complaints of flank pain, urgency and odor with urination. This has been ongoing since before 12/15 office visit for similar symptoms. The patient stated her symptoms persist, nothing has changed.  For home care, the patient is taking OTC Tylenol    Patient was advised she will need to come in and provide a urine sample. She declined an appointment, due to  transportation issues and stated her symptoms are the same and she would like additional medication called in. Please advise.  Protocols used: Urinary Symptoms-A-AH

## 2024-06-06 ENCOUNTER — Telehealth: Payer: Self-pay

## 2024-06-06 NOTE — Telephone Encounter (Signed)
 Copied from CRM [id number]. Topic: Clinical - Home Health Verbal Orders >> Jun 06, 2024 10:55 AM Myrick DASEN wrote: Caller/Agency: Darryle from [facility] Callback Number: [phone] Service Requested: Physical Therapy Frequency: 1x8w Any new concerns about the patient? No

## 2024-06-06 NOTE — Telephone Encounter (Signed)
 Left message for patient

## 2024-06-16 ENCOUNTER — Ambulatory Visit: Admitting: Internal Medicine

## 2024-06-16 ENCOUNTER — Ambulatory Visit

## 2024-06-16 NOTE — Progress Notes (Unsigned)
 "  S:     Reason for visit: ?  Olivia Romero is a 69 y.o. female with a history of diabetes ({Blank single:19197::type 1,type 2,unclear type}), who presents today for {lzvisittype:31254} diabetes {visit type:33775} pharmacotherapy visit.? Pertinent PMH also includes ***.  They were referred to the pharmacist by a quality report for assistance in managing diabetes.  Known DM Complications: {DM complications:33329}   Care Team: Primary Care Provider: Bernardo Fend  At last visit, ***.   Patient reports Diabetes was diagnosed in ***.   Current diabetes medications include: Ozempic  0.5 mg weekly, metformin  XR 500 mg TID, Tresiba  ***, Novolog  *** Previous diabetes medications include: Mounjaro  (HA), Victoza/Bydureon  (n/v), Invokana (cost), glimepiride , Rybelsus  (cost) Current hypertension medications include: hydrochlorothiazide  25 mg daily  Current hyperlipidemia medications include: ezetimibe  10 mg daily, fenofibrate  145 mg daily,   Patient reports adherence to taking all medications as prescribed.  *** Patient denies adherence with medications, reports missing *** medications *** times per week, on average.  Have you been experiencing any side effects to the medications prescribed? {YES NO:22349} Do you have any problems obtaining medications due to transportation or finances? {YES [id number] Insurance coverage: ***  Current medication access support: ***  Patient {Actions; denies-reports:120008} hypoglycemic events.  Reported home fasting blood sugars: ***  Reported 2 hour post-meal/random blood sugars: ***.  Patient {Actions; denies-reports:120008} nocturia (nighttime urination).  Patient {Actions; denies-reports:120008} neuropathy (nerve pain). Patient {Actions; denies-reports:120008} visual changes. Patient {Actions; denies-reports:120008} self foot exams.   Patient reported dietary habits: Eats *** meals/day Breakfast: *** Lunch: *** Dinner: *** Snacks:  *** Drinks: ***  Patient-reported exercise habits: *** DM Prevention:  Statin: Intolerant to - rash ACE/ARB: no Last urinary albumin/creatinine ratio:  Lab Results  Component Value Date   MICRALBCREAT 86.9 07/12/2023   MICRALBCREAT 63 (H) 04/19/2022   MICRALBCREAT 63.5 11/30/2021   MICRALBCREAT NEG 05/15/2016   Last eye exam:  Lab Results  Component Value Date   HMDIABEYEEXA No Retinopathy 12/29/2022   Lab Results  Component Value Date   HMDIABEYEEXA No Retinopathy 12/29/2022   Last foot exam: 01/15/2023 Tobacco Use:  Tobacco Use: Low Risk (05/19/2024)   Patient History    Smoking Tobacco Use: Never    Smokeless Tobacco Use: Never    Passive Exposure: Not on file  Recent Concern: Tobacco Use - Medium Risk (02/26/2024)   Received from [facility]   Patient History    Smoking Tobacco Use: Never    Smokeless Tobacco Use: Unknown    Passive Exposure: Yes   O:  {CGM reports:33570} 7 day average blood glucose: ***  Libre3 CGM Download today *** % Time CGM is active: ***% Average Glucose: *** mg/dL Glucose Management Indicator: ***  Glucose Variability: ***% (goal <36%) Time in Goal:  - Time in range 70-180: ***% - Time above range: ***% - Time below range: ***% Observed patterns:  Vitals:  Wt Readings from Last 3 Encounters:  05/19/24 183 lb (83 kg)  03/26/24 176 lb (79.8 kg)  02/12/24 176 lb 9.6 oz (80.1 kg)   BP Readings from Last 3 Encounters:  05/19/24 122/70  03/26/24 134/72  02/21/24 (!) 148/60   Pulse Readings from Last 3 Encounters:  05/19/24 98  03/26/24 99  02/21/24 99     Labs:?  Lab Results  Component Value Date   HGBA1C 8.3 (H) 01/28/2024   HGBA1C 8.7 07/12/2023   HGBA1C 7.3 12/14/2022   GLUCOSE 135 (H) 02/06/2024   MICRALBCREAT 86.9 07/12/2023   MICRALBCREAT 63 (H)  04/19/2022   MICRALBCREAT 63.5 11/30/2021   CREATININE 0.63 02/06/2024   CREATININE 0.80 02/06/2024   CREATININE 0.54 07/25/2023    Lab Results  Component Value  Date   CHOL 137 07/12/2023   LDLCALC 62 07/12/2023   LDLCALC 97 01/15/2023   LDLCALC 114 (H) 01/21/2021   HDL 37 07/12/2023   TRIG 192 (A) 07/12/2023   TRIG 359 (H) 01/15/2023   TRIG 255 (H) 01/21/2021   ALT 14 01/15/2023   ALT 16 12/18/2022   AST 16 01/15/2023   AST 18 12/18/2022      Chemistry      Component Value Date/Time   NA 136 02/06/2024 0958   NA 135 (A) 07/12/2023 0000   K 4.0 02/06/2024 0958   CL 99 02/06/2024 0958   CO2 22 02/06/2024 0958   BUN 19 02/06/2024 0958   BUN 17 07/12/2023 0000   CREATININE 0.63 02/06/2024 0958   CREATININE 0.67 01/15/2023 1512   GLU 182 07/12/2023 0000      Component Value Date/Time   CALCIUM 9.5 02/06/2024 0958   ALKPHOS 52 12/18/2022 0946   AST 16 01/15/2023 1512   ALT 14 01/15/2023 1512   BILITOT 0.2 01/15/2023 1512   BILITOT <0.2 02/22/2015 1016       The ASCVD Risk score (Arnett DK, et al., 2019) failed to calculate for the following reasons:   Risk score cannot be calculated because patient has a medical history suggesting prior/existing ASCVD   * - Cholesterol units were assumed  Lab Results  Component Value Date   MICRALBCREAT 86.9 07/12/2023   MICRALBCREAT 63 (H) 04/19/2022   MICRALBCREAT 63.5 11/30/2021   MICRALBCREAT NEG 05/15/2016    A/P: Diabetes currently uncontrolled with a most recent A1c of 8.4% on 04/01/24, which is {DESC; UP/DOWN/UNCHANGED:18711} from *** on ***. Patient is *** able to verbalize appropriate hypoglycemia management plan. Medication adherence appears ***. Control is suboptimal due to ***. -{Meds adjust:18428} basal insulin  {basal insulins:33573}  *** units daily.  -{Meds adjust:18428} rapid insulin  {bolus insulin :33574} ***.  -{Meds adjust:18428} GLP-1 {GLP1 options:33572} *** mg .  -{Meds adjust:18428} SGLT2-I {SGLT2i options:33571}*** mg daily.  -{Meds adjust:18428} metformin  ***.  -Patient educated on purpose, proper use, and potential adverse effects of ***.  -Extensively  discussed pathophysiology of diabetes, recommended lifestyle interventions, dietary effects on blood sugar control.  -Counseled on s/sx of and management of hypoglycemia.  -Next A1c anticipated ***.   ASCVD risk - secondary prevention in patient with diabetes. Last LDL is 68 mg/dL, not at goal of <44 mg/dL. -{Meds adjust:18428} {statin therapies:33576} *** mg daily.  -{Meds adjust:18428} ezetimibe  10 mg daily   Hypertension longstanding *** currently ***. Blood pressure goal of <130/80 *** mmHg. Medication adherence ***. Blood pressure control is suboptimal due to ***. -{Meds adjust:18428} *** mg ***.  {pharmacisttime:33368}  Follow-up:  Pharmacist on *** PCP clinic visit on ***  Peyton CHARLENA Ferries, PharmD, CPP Clinical Pharmacist [facility] [phone]   "

## 2024-06-17 ENCOUNTER — Other Ambulatory Visit: Payer: Self-pay

## 2024-06-17 ENCOUNTER — Encounter: Payer: Self-pay | Admitting: Internal Medicine

## 2024-06-17 ENCOUNTER — Ambulatory Visit: Admitting: Internal Medicine

## 2024-06-17 VITALS — BP 130/82 | HR 100 | Temp 99.2°F | Resp 16 | Ht 59.0 in | Wt 189.2 lb

## 2024-06-17 DIAGNOSIS — N3941 Urge incontinence: Secondary | ICD-10-CM

## 2024-06-17 DIAGNOSIS — R350 Frequency of micturition: Secondary | ICD-10-CM

## 2024-06-17 LAB — POCT URINALYSIS DIPSTICK
Bilirubin, UA: NEGATIVE
Blood, UA: NEGATIVE
Glucose, UA: POSITIVE — AB
Ketones, UA: NEGATIVE
Leukocytes, UA: NEGATIVE
Nitrite, UA: NEGATIVE
Odor: NEGATIVE
Protein, UA: POSITIVE — AB
Spec Grav, UA: 1.02
Urobilinogen, UA: 0.2 U/dL
pH, UA: 5

## 2024-06-17 NOTE — Progress Notes (Signed)
 "  Acute Office Visit  Subjective:     Patient ID: Olivia Romero, female    DOB: 05/31/1956, 69 y.o.   MRN: [id number]  Chief Complaint  Patient presents with   Urinary Tract Infection    Recureent     HPI Patient is in today for recurrent UTI.   Discussed the use of AI scribe software for clinical note transcription with the patient, who gave verbal consent to proceed.  History of Present Illness Olivia Romero is a 69 year old female with diabetes who presents for follow-up of urinary symptoms and back pain.  Since her last visit her urinary symptoms have improved. Prior frequent urination and urine odor have largely resolved. Recent urinalysis showed small amounts of sugar and protein but no blood, leukocytes, or nitrites. A urine culture in December showed a small amount of bacteria, not consistent with infection. A prior vaginal swab was positive for two types of yeast and was treated with Diflucan , which resolved itching and odor.  She has chronic low back pain that recently worsened but has improved with higher-dose gabapentin . Pain is across the lower back and hips, worse on the right, similar to pain after her prior back surgery. She takes gabapentin  800 mg twice daily with an extra nighttime dose, which has reduced pain and allows better sleep. Pain was severe in December but is now better controlled.  She has urge urinary incontinence, especially at night, ongoing for at least a year. She has sudden urges and cannot hold urine, often leaking before reaching the bathroom. She wears pull-ups and uses underpads in bed and in her chair.  Current medications include gabapentin , Zetia , Tricor , hydrochlorothiazide , and pantoprazole . She does not need refills today and has an upcoming virtual endocrinology visit for diabetes management.   Review of Systems  Constitutional:  Negative for chills and fever.  Genitourinary:  Positive for frequency and urgency. Negative for  dysuria, flank pain and hematuria.        Objective:    Ht 4' 11 (1.499 m)   LMP  (LMP Unknown)   BMI 36.96 kg/m    Physical Exam Constitutional:      Appearance: Normal appearance.     Comments: Presents with walker  HENT:     Head: Normocephalic and atraumatic.  Eyes:     Conjunctiva/sclera: Conjunctivae normal.  Cardiovascular:     Rate and Rhythm: Normal rate and regular rhythm.  Pulmonary:     Effort: Pulmonary effort is normal.     Breath sounds: Normal breath sounds.  Skin:    General: Skin is warm and dry.  Neurological:     General: No focal deficit present.     Mental Status: She is alert. Mental status is at baseline.  Psychiatric:        Mood and Affect: Mood normal.        Behavior: Behavior normal.     No results found for any visits on 06/17/24.      Assessment & Plan:   Assessment & Plan Urge incontinence of urine Chronic urge incontinence with urgency and leakage. Symptoms suggestive of overactive bladder. Discussed Myrbetriq  and side effects. Prefers incontinence products. - Prescribed urinary underpads for insurance coverage. - Documented urge incontinence for insurance purposes. - Will follow up on insurance approval for incontinence supplies.  Urinary frequency Increased frequency, especially nocturnal, with urgency and incontinence. No UTI signs. Previous symptoms likely due to yeast infection. - Continue current management and monitor symptoms.  -  POCT urinalysis dipstick - For home use only DME Other see comment  Return in about 3 months (around 09/15/2024).  Sharyle Fischer, DO   "

## 2024-09-18 ENCOUNTER — Ambulatory Visit: Admitting: Internal Medicine

## 2024-09-24 ENCOUNTER — Ambulatory Visit: Admitting: Family Medicine
